# Patient Record
Sex: Female | Born: 1935 | Race: Black or African American | Hispanic: No | Smoking: Former smoker
Health system: Southern US, Community
[De-identification: ages and names within clinical notes are randomized; demographics above are authoritative.]

## PROBLEM LIST (undated history)

## (undated) DIAGNOSIS — K219 Gastro-esophageal reflux disease without esophagitis: Secondary | ICD-10-CM

## (undated) DIAGNOSIS — K4030 Unilateral inguinal hernia, with obstruction, without gangrene, not specified as recurrent: Secondary | ICD-10-CM

## (undated) DIAGNOSIS — F419 Anxiety disorder, unspecified: Secondary | ICD-10-CM

## (undated) DIAGNOSIS — G8929 Other chronic pain: Secondary | ICD-10-CM

## (undated) DIAGNOSIS — I10 Essential (primary) hypertension: Secondary | ICD-10-CM

## (undated) DIAGNOSIS — N189 Chronic kidney disease, unspecified: Secondary | ICD-10-CM

## (undated) DIAGNOSIS — I509 Heart failure, unspecified: Secondary | ICD-10-CM

## (undated) DIAGNOSIS — F32A Depression, unspecified: Secondary | ICD-10-CM

## (undated) DIAGNOSIS — E785 Hyperlipidemia, unspecified: Secondary | ICD-10-CM

## (undated) DIAGNOSIS — M545 Low back pain, unspecified: Secondary | ICD-10-CM

## (undated) DIAGNOSIS — D649 Anemia, unspecified: Secondary | ICD-10-CM

## (undated) DIAGNOSIS — I219 Acute myocardial infarction, unspecified: Secondary | ICD-10-CM

## (undated) DIAGNOSIS — E119 Type 2 diabetes mellitus without complications: Secondary | ICD-10-CM

## (undated) DIAGNOSIS — E669 Obesity, unspecified: Secondary | ICD-10-CM

## (undated) HISTORY — PX: ABDOMINAL HYSTERECTOMY: SHX81

## (undated) HISTORY — DX: Heart failure, unspecified: I50.9

## (undated) HISTORY — DX: Acute myocardial infarction, unspecified: I21.9

## (undated) HISTORY — DX: Essential (primary) hypertension: I10

## (undated) HISTORY — PX: VAGINAL HYSTERECTOMY: SHX2639

## (undated) HISTORY — DX: Hyperlipidemia, unspecified: E78.5

## (undated) HISTORY — PX: APPENDECTOMY: SHX54

## (undated) HISTORY — DX: Low back pain, unspecified: M54.50

## (undated) HISTORY — DX: Low back pain: M54.5

## (undated) HISTORY — PX: INCISION AND DRAINAGE PERIRECTAL ABSCESS: SHX1804

## (undated) HISTORY — DX: Obesity, unspecified: E66.9

## (undated) HISTORY — PX: HERNIA REPAIR: SHX51

## (undated) HISTORY — DX: Other chronic pain: G89.29

## (undated) HISTORY — PX: KNEE SURGERY: SHX244

## (undated) HISTORY — PX: THYROID SURGERY: SHX805

## (undated) HISTORY — PX: EYE SURGERY: SHX253

## (undated) HISTORY — DX: Type 2 diabetes mellitus without complications: E11.9

## (undated) HISTORY — DX: Chronic kidney disease, unspecified: N18.9

---

## 2004-02-10 ENCOUNTER — Ambulatory Visit: Payer: Self-pay | Admitting: Family Medicine

## 2004-02-24 ENCOUNTER — Ambulatory Visit: Payer: Self-pay | Admitting: Pain Medicine

## 2004-04-22 ENCOUNTER — Ambulatory Visit: Payer: Self-pay | Admitting: Pain Medicine

## 2004-07-08 ENCOUNTER — Ambulatory Visit: Payer: Self-pay | Admitting: Pain Medicine

## 2004-09-28 ENCOUNTER — Ambulatory Visit: Payer: Self-pay | Admitting: Pain Medicine

## 2004-11-25 ENCOUNTER — Ambulatory Visit: Payer: Self-pay | Admitting: Pain Medicine

## 2005-01-27 ENCOUNTER — Ambulatory Visit: Payer: Self-pay | Admitting: Pain Medicine

## 2005-04-14 ENCOUNTER — Ambulatory Visit: Payer: Self-pay | Admitting: Pain Medicine

## 2005-06-06 ENCOUNTER — Ambulatory Visit: Payer: Self-pay | Admitting: Pain Medicine

## 2005-06-30 ENCOUNTER — Ambulatory Visit: Payer: Self-pay | Admitting: Pain Medicine

## 2005-07-04 ENCOUNTER — Ambulatory Visit: Payer: Self-pay | Admitting: Pain Medicine

## 2005-08-02 ENCOUNTER — Ambulatory Visit: Payer: Self-pay | Admitting: Pain Medicine

## 2005-08-30 ENCOUNTER — Ambulatory Visit: Payer: Self-pay | Admitting: Pain Medicine

## 2005-11-08 ENCOUNTER — Ambulatory Visit: Payer: Self-pay | Admitting: Pain Medicine

## 2006-02-21 ENCOUNTER — Ambulatory Visit: Payer: Self-pay | Admitting: Pain Medicine

## 2006-03-06 ENCOUNTER — Ambulatory Visit: Payer: Self-pay | Admitting: Pain Medicine

## 2006-04-18 ENCOUNTER — Ambulatory Visit: Payer: Self-pay | Admitting: Pain Medicine

## 2006-06-01 ENCOUNTER — Ambulatory Visit: Payer: Self-pay | Admitting: Pain Medicine

## 2006-08-24 ENCOUNTER — Ambulatory Visit: Payer: Self-pay | Admitting: Pain Medicine

## 2006-11-16 ENCOUNTER — Ambulatory Visit: Payer: Self-pay | Admitting: Pain Medicine

## 2007-03-15 ENCOUNTER — Ambulatory Visit: Payer: Self-pay | Admitting: Pain Medicine

## 2007-06-12 ENCOUNTER — Ambulatory Visit: Payer: Self-pay | Admitting: Pain Medicine

## 2007-10-09 ENCOUNTER — Ambulatory Visit: Payer: Self-pay | Admitting: Pain Medicine

## 2008-01-08 ENCOUNTER — Ambulatory Visit: Payer: Self-pay | Admitting: Pain Medicine

## 2008-04-03 ENCOUNTER — Ambulatory Visit: Payer: Self-pay | Admitting: Pain Medicine

## 2008-07-03 ENCOUNTER — Ambulatory Visit: Payer: Self-pay | Admitting: Pain Medicine

## 2008-10-02 ENCOUNTER — Ambulatory Visit: Payer: Self-pay | Admitting: Pain Medicine

## 2008-11-07 ENCOUNTER — Ambulatory Visit: Payer: Self-pay | Admitting: Specialist

## 2009-01-15 ENCOUNTER — Ambulatory Visit: Payer: Self-pay | Admitting: Pain Medicine

## 2009-02-19 ENCOUNTER — Ambulatory Visit (HOSPITAL_COMMUNITY): Admission: RE | Admit: 2009-02-19 | Discharge: 2009-02-20 | Payer: Self-pay | Admitting: Ophthalmology

## 2009-08-06 ENCOUNTER — Ambulatory Visit (HOSPITAL_COMMUNITY)
Admission: RE | Admit: 2009-08-06 | Discharge: 2009-08-07 | Payer: Self-pay | Source: Home / Self Care | Admitting: Psychiatry

## 2009-10-27 ENCOUNTER — Ambulatory Visit: Payer: Self-pay | Admitting: Family Medicine

## 2009-11-25 ENCOUNTER — Ambulatory Visit: Payer: Self-pay | Admitting: Family Medicine

## 2009-12-01 ENCOUNTER — Ambulatory Visit: Payer: Self-pay | Admitting: Family Medicine

## 2010-04-20 ENCOUNTER — Ambulatory Visit: Payer: Self-pay | Admitting: Family Medicine

## 2010-05-02 ENCOUNTER — Ambulatory Visit: Payer: Self-pay | Admitting: Family Medicine

## 2010-06-02 ENCOUNTER — Ambulatory Visit: Payer: Self-pay | Admitting: Family Medicine

## 2010-07-01 ENCOUNTER — Ambulatory Visit: Payer: Self-pay | Admitting: Family Medicine

## 2010-07-21 LAB — CBC
Hemoglobin: 14.1 g/dL (ref 12.0–15.0)
MCHC: 32.2 g/dL (ref 30.0–36.0)
MCV: 86.7 fL (ref 78.0–100.0)

## 2010-07-21 LAB — BASIC METABOLIC PANEL
CO2: 32 mEq/L (ref 19–32)
Calcium: 9.7 mg/dL (ref 8.4–10.5)
GFR calc Af Amer: >60 mL/min (ref >60)
GFR calc non Af Amer: 57 mL/min — ABNORMAL LOW (ref >60)
Glucose, Bld: 131 mg/dL — ABNORMAL HIGH (ref 70–99)

## 2010-07-21 LAB — GLUCOSE, CAPILLARY
Glucose-Capillary: 143 mg/dL — ABNORMAL HIGH (ref 70–99)
Glucose-Capillary: 167 mg/dL — ABNORMAL HIGH (ref 70–99)
Glucose-Capillary: 173 mg/dL — ABNORMAL HIGH (ref 70–99)
Glucose-Capillary: 83 mg/dL (ref 70–99)

## 2010-08-05 LAB — CBC
Hemoglobin: 13.8 g/dL (ref 12.0–15.0)
MCHC: 32.9 g/dL (ref 30.0–36.0)
MCV: 87.3 fL (ref 78.0–100.0)
RBC: 4.79 MIL/uL (ref 3.87–5.11)
RDW: 14 % (ref 11.5–15.5)

## 2010-08-05 LAB — GLUCOSE, CAPILLARY
Glucose-Capillary: 107 mg/dL — ABNORMAL HIGH (ref 70–99)
Glucose-Capillary: 229 mg/dL — ABNORMAL HIGH (ref 70–99)
Glucose-Capillary: 238 mg/dL — ABNORMAL HIGH (ref 70–99)

## 2010-08-05 LAB — BASIC METABOLIC PANEL
BUN: 20 mg/dL (ref 6–23)
CO2: 28 mEq/L (ref 19–32)
Calcium: 10.2 mg/dL (ref 8.4–10.5)
Creatinine, Ser: 0.95 mg/dL (ref 0.4–1.2)
Glucose, Bld: 56 mg/dL — ABNORMAL LOW (ref 70–99)
Potassium: 3.8 mEq/L (ref 3.5–5.1)
Sodium: 141 mEq/L (ref 135–145)

## 2010-09-09 ENCOUNTER — Ambulatory Visit: Payer: Self-pay

## 2011-04-08 ENCOUNTER — Ambulatory Visit (INDEPENDENT_AMBULATORY_CARE_PROVIDER_SITE_OTHER): Payer: Self-pay | Admitting: Ophthalmology

## 2011-04-14 ENCOUNTER — Ambulatory Visit (INDEPENDENT_AMBULATORY_CARE_PROVIDER_SITE_OTHER): Payer: PRIVATE HEALTH INSURANCE | Admitting: Ophthalmology

## 2011-04-14 DIAGNOSIS — E11359 Type 2 diabetes mellitus with proliferative diabetic retinopathy without macular edema: Secondary | ICD-10-CM

## 2011-04-14 DIAGNOSIS — E1165 Type 2 diabetes mellitus with hyperglycemia: Secondary | ICD-10-CM

## 2011-04-14 DIAGNOSIS — H43819 Vitreous degeneration, unspecified eye: Secondary | ICD-10-CM

## 2011-08-15 ENCOUNTER — Ambulatory Visit (INDEPENDENT_AMBULATORY_CARE_PROVIDER_SITE_OTHER): Payer: PRIVATE HEALTH INSURANCE | Admitting: Ophthalmology

## 2011-08-15 DIAGNOSIS — E1139 Type 2 diabetes mellitus with other diabetic ophthalmic complication: Secondary | ICD-10-CM

## 2011-08-15 DIAGNOSIS — E11359 Type 2 diabetes mellitus with proliferative diabetic retinopathy without macular edema: Secondary | ICD-10-CM

## 2011-11-30 ENCOUNTER — Ambulatory Visit: Payer: Self-pay | Admitting: Family Medicine

## 2012-02-14 ENCOUNTER — Ambulatory Visit (INDEPENDENT_AMBULATORY_CARE_PROVIDER_SITE_OTHER): Payer: PRIVATE HEALTH INSURANCE | Admitting: Ophthalmology

## 2012-03-06 ENCOUNTER — Ambulatory Visit (INDEPENDENT_AMBULATORY_CARE_PROVIDER_SITE_OTHER): Payer: PRIVATE HEALTH INSURANCE | Admitting: Ophthalmology

## 2012-03-21 ENCOUNTER — Ambulatory Visit (INDEPENDENT_AMBULATORY_CARE_PROVIDER_SITE_OTHER): Payer: PRIVATE HEALTH INSURANCE | Admitting: Ophthalmology

## 2012-03-21 DIAGNOSIS — I10 Essential (primary) hypertension: Secondary | ICD-10-CM

## 2012-03-21 DIAGNOSIS — E1165 Type 2 diabetes mellitus with hyperglycemia: Secondary | ICD-10-CM

## 2012-03-21 DIAGNOSIS — E11359 Type 2 diabetes mellitus with proliferative diabetic retinopathy without macular edema: Secondary | ICD-10-CM

## 2012-03-21 DIAGNOSIS — H35039 Hypertensive retinopathy, unspecified eye: Secondary | ICD-10-CM

## 2012-03-21 DIAGNOSIS — H3581 Retinal edema: Secondary | ICD-10-CM

## 2012-03-21 DIAGNOSIS — E1139 Type 2 diabetes mellitus with other diabetic ophthalmic complication: Secondary | ICD-10-CM

## 2012-03-21 DIAGNOSIS — H34219 Partial retinal artery occlusion, unspecified eye: Secondary | ICD-10-CM

## 2012-04-06 ENCOUNTER — Other Ambulatory Visit (INDEPENDENT_AMBULATORY_CARE_PROVIDER_SITE_OTHER): Payer: PRIVATE HEALTH INSURANCE | Admitting: Ophthalmology

## 2012-04-06 DIAGNOSIS — H3581 Retinal edema: Secondary | ICD-10-CM

## 2012-04-06 DIAGNOSIS — E1139 Type 2 diabetes mellitus with other diabetic ophthalmic complication: Secondary | ICD-10-CM

## 2012-08-06 ENCOUNTER — Ambulatory Visit (INDEPENDENT_AMBULATORY_CARE_PROVIDER_SITE_OTHER): Payer: PRIVATE HEALTH INSURANCE | Admitting: Ophthalmology

## 2012-08-10 ENCOUNTER — Ambulatory Visit (INDEPENDENT_AMBULATORY_CARE_PROVIDER_SITE_OTHER): Payer: PRIVATE HEALTH INSURANCE | Admitting: Ophthalmology

## 2012-08-10 DIAGNOSIS — E1165 Type 2 diabetes mellitus with hyperglycemia: Secondary | ICD-10-CM

## 2012-08-10 DIAGNOSIS — H35039 Hypertensive retinopathy, unspecified eye: Secondary | ICD-10-CM

## 2012-08-10 DIAGNOSIS — E1139 Type 2 diabetes mellitus with other diabetic ophthalmic complication: Secondary | ICD-10-CM

## 2012-08-10 DIAGNOSIS — E11319 Type 2 diabetes mellitus with unspecified diabetic retinopathy without macular edema: Secondary | ICD-10-CM

## 2012-08-10 DIAGNOSIS — I10 Essential (primary) hypertension: Secondary | ICD-10-CM

## 2013-02-11 ENCOUNTER — Ambulatory Visit (INDEPENDENT_AMBULATORY_CARE_PROVIDER_SITE_OTHER): Payer: PRIVATE HEALTH INSURANCE | Admitting: Ophthalmology

## 2013-02-11 DIAGNOSIS — I10 Essential (primary) hypertension: Secondary | ICD-10-CM

## 2013-02-11 DIAGNOSIS — H35039 Hypertensive retinopathy, unspecified eye: Secondary | ICD-10-CM

## 2013-02-11 DIAGNOSIS — E11359 Type 2 diabetes mellitus with proliferative diabetic retinopathy without macular edema: Secondary | ICD-10-CM

## 2013-02-11 DIAGNOSIS — H43819 Vitreous degeneration, unspecified eye: Secondary | ICD-10-CM

## 2013-02-11 DIAGNOSIS — E1139 Type 2 diabetes mellitus with other diabetic ophthalmic complication: Secondary | ICD-10-CM

## 2013-05-10 ENCOUNTER — Ambulatory Visit: Payer: Self-pay | Admitting: Family Medicine

## 2013-05-11 LAB — COMPREHENSIVE METABOLIC PANEL
ALT: 33 U/L (ref 12–78)
Albumin: 3.6 g/dL (ref 3.4–5.0)
Alkaline Phosphatase: 107 U/L
Anion Gap: 2 — ABNORMAL LOW (ref 7–16)
BILIRUBIN TOTAL: 0.2 mg/dL (ref 0.2–1.0)
BUN: 25 mg/dL — ABNORMAL HIGH (ref 7–18)
CHLORIDE: 104 mmol/L (ref 98–107)
CO2: 33 mmol/L — AB (ref 21–32)
Calcium, Total: 8.8 mg/dL (ref 8.5–10.1)
Creatinine: 1.46 mg/dL — ABNORMAL HIGH (ref 0.60–1.30)
EGFR (African American): 39 — ABNORMAL LOW
GFR CALC NON AF AMER: 34 — AB
Glucose: 275 mg/dL — ABNORMAL HIGH (ref 65–99)
OSMOLALITY: 292 (ref 275–301)
POTASSIUM: 4.1 mmol/L (ref 3.5–5.1)
SGOT(AST): 46 U/L — ABNORMAL HIGH (ref 15–37)
Sodium: 139 mmol/L (ref 136–145)
Total Protein: 7.7 g/dL (ref 6.4–8.2)

## 2013-05-11 LAB — URINALYSIS, COMPLETE
BILIRUBIN, UR: NEGATIVE
Ketone: NEGATIVE
Leukocyte Esterase: NEGATIVE
Nitrite: NEGATIVE
Ph: 7 (ref 4.5–8.0)
RBC,UR: 8 /HPF (ref 0–5)
SPECIFIC GRAVITY: 1.01 (ref 1.003–1.030)
WBC UR: 5 /HPF (ref 0–5)

## 2013-05-11 LAB — CBC
HCT: 44.9 % (ref 35.0–47.0)
HGB: 14.5 g/dL (ref 12.0–16.0)
MCH: 27.3 pg (ref 26.0–34.0)
MCHC: 32.3 g/dL (ref 32.0–36.0)
MCV: 85 fL (ref 80–100)
Platelet: 212 10*3/uL (ref 150–440)
RBC: 5.31 10*6/uL — ABNORMAL HIGH (ref 3.80–5.20)
RDW: 14.2 % (ref 11.5–14.5)
WBC: 5.9 10*3/uL (ref 3.6–11.0)

## 2013-05-11 LAB — PRO B NATRIURETIC PEPTIDE: B-Type Natriuretic Peptide: 1091 pg/mL — ABNORMAL HIGH (ref 0–450)

## 2013-05-11 LAB — CK TOTAL AND CKMB (NOT AT ARMC)
CK, Total: 1555 U/L — ABNORMAL HIGH (ref 21–215)
CK-MB: 9.6 ng/mL — ABNORMAL HIGH (ref 0.5–3.6)

## 2013-05-11 LAB — TROPONIN I: Troponin-I: 0.2 ng/mL — ABNORMAL HIGH

## 2013-05-12 ENCOUNTER — Inpatient Hospital Stay: Payer: Self-pay | Admitting: Internal Medicine

## 2013-05-12 DIAGNOSIS — I319 Disease of pericardium, unspecified: Secondary | ICD-10-CM

## 2013-05-12 DIAGNOSIS — I10 Essential (primary) hypertension: Secondary | ICD-10-CM

## 2013-05-12 DIAGNOSIS — I214 Non-ST elevation (NSTEMI) myocardial infarction: Secondary | ICD-10-CM

## 2013-05-12 DIAGNOSIS — I509 Heart failure, unspecified: Secondary | ICD-10-CM

## 2013-05-12 LAB — CK-MB
CK-MB: 8.2 ng/mL — AB (ref 0.5–3.6)
CK-MB: 6.4 ng/mL — ABNORMAL HIGH (ref 0.5–3.6)

## 2013-05-12 LAB — TROPONIN I
TROPONIN-I: 0.19 ng/mL — AB
Troponin-I: 0.2 ng/mL — ABNORMAL HIGH

## 2013-05-13 DIAGNOSIS — R7989 Other specified abnormal findings of blood chemistry: Secondary | ICD-10-CM

## 2013-05-13 DIAGNOSIS — I5033 Acute on chronic diastolic (congestive) heart failure: Secondary | ICD-10-CM

## 2013-05-13 DIAGNOSIS — I10 Essential (primary) hypertension: Secondary | ICD-10-CM

## 2013-05-13 LAB — CBC WITH DIFFERENTIAL/PLATELET
BASOS ABS: 0 10*3/uL (ref 0.0–0.1)
Basophil %: 0.2 %
EOS ABS: 0 10*3/uL (ref 0.0–0.7)
EOS PCT: 0 %
HCT: 39.4 % (ref 35.0–47.0)
HGB: 12.8 g/dL (ref 12.0–16.0)
LYMPHS ABS: 0.5 10*3/uL — AB (ref 1.0–3.6)
Lymphocyte %: 8.8 %
MCH: 27.3 pg (ref 26.0–34.0)
MCHC: 32.4 g/dL (ref 32.0–36.0)
MCV: 84 fL (ref 80–100)
MONO ABS: 0.1 x10 3/mm — AB (ref 0.2–0.9)
MONOS PCT: 2 %
Neutrophil #: 5.4 10*3/uL (ref 1.4–6.5)
Neutrophil %: 89 %
PLATELETS: 215 10*3/uL (ref 150–440)
RBC: 4.67 10*6/uL (ref 3.80–5.20)
RDW: 14.1 % (ref 11.5–14.5)
WBC: 6.1 10*3/uL (ref 3.6–11.0)

## 2013-05-13 LAB — BASIC METABOLIC PANEL
ANION GAP: 6 — AB (ref 7–16)
BUN: 48 mg/dL — ABNORMAL HIGH (ref 7–18)
CO2: 29 mmol/L (ref 21–32)
Calcium, Total: 8.3 mg/dL — ABNORMAL LOW (ref 8.5–10.1)
Chloride: 99 mmol/L (ref 98–107)
Creatinine: 2.04 mg/dL — ABNORMAL HIGH (ref 0.60–1.30)
EGFR (African American): 26 — ABNORMAL LOW
EGFR (Non-African Amer.): 23 — ABNORMAL LOW
Glucose: 281 mg/dL — ABNORMAL HIGH (ref 65–99)
OSMOLALITY: 291 (ref 275–301)
POTASSIUM: 4.2 mmol/L (ref 3.5–5.1)
Sodium: 134 mmol/L — ABNORMAL LOW (ref 136–145)

## 2013-05-13 LAB — LIPID PANEL
Cholesterol: 137 mg/dL (ref 0–200)
HDL Cholesterol: 43 mg/dL (ref 40–60)
Ldl Cholesterol, Calc: 73 mg/dL (ref 0–100)
Triglycerides: 104 mg/dL (ref 0–200)
VLDL CHOLESTEROL, CALC: 21 mg/dL (ref 5–40)

## 2013-05-13 LAB — TSH: THYROID STIMULATING HORM: 0.931 u[IU]/mL

## 2013-05-13 LAB — HEMOGLOBIN A1C: Hemoglobin A1C: 8.5 % — ABNORMAL HIGH (ref 4.2–6.3)

## 2013-05-14 LAB — BASIC METABOLIC PANEL
Anion Gap: 7 (ref 7–16)
BUN: 70 mg/dL — ABNORMAL HIGH (ref 7–18)
Calcium, Total: 8.1 mg/dL — ABNORMAL LOW (ref 8.5–10.1)
Chloride: 98 mmol/L (ref 98–107)
Co2: 28 mmol/L (ref 21–32)
Creatinine: 2.47 mg/dL — ABNORMAL HIGH (ref 0.60–1.30)
EGFR (African American): 21 — ABNORMAL LOW
GFR CALC NON AF AMER: 18 — AB
Glucose: 160 mg/dL — ABNORMAL HIGH (ref 65–99)
Osmolality: 290 (ref 275–301)
Potassium: 4.1 mmol/L (ref 3.5–5.1)
Sodium: 133 mmol/L — ABNORMAL LOW (ref 136–145)

## 2013-05-15 ENCOUNTER — Telehealth: Payer: Self-pay

## 2013-05-15 LAB — BASIC METABOLIC PANEL
Anion Gap: 5 — ABNORMAL LOW (ref 7–16)
BUN: 80 mg/dL — AB (ref 7–18)
CHLORIDE: 99 mmol/L (ref 98–107)
Calcium, Total: 8 mg/dL — ABNORMAL LOW (ref 8.5–10.1)
Co2: 28 mmol/L (ref 21–32)
Creatinine: 2.27 mg/dL — ABNORMAL HIGH (ref 0.60–1.30)
EGFR (African American): 23 — ABNORMAL LOW
EGFR (Non-African Amer.): 20 — ABNORMAL LOW
GLUCOSE: 95 mg/dL (ref 65–99)
Osmolality: 288 (ref 275–301)
POTASSIUM: 4 mmol/L (ref 3.5–5.1)
SODIUM: 132 mmol/L — AB (ref 136–145)

## 2013-05-15 NOTE — Telephone Encounter (Signed)
Attempted to contact pt regarding discharge from [facility] on 05/15/13. Left message for pt to call back.

## 2013-05-16 NOTE — Telephone Encounter (Signed)
Patient contacted regarding discharge from [facility] on 05/15/13.  Patient understands to follow up with Dr. Fletcher Anon on 05/23/13 at 2:45 at [facility]. Patient understands discharge instructions? yes Patient understands medications and regiment? yes Patient understands to bring all medications to this visit? yes

## 2013-05-22 ENCOUNTER — Encounter: Payer: Self-pay | Admitting: *Deleted

## 2013-05-23 ENCOUNTER — Encounter: Payer: Self-pay | Admitting: Cardiovascular Disease

## 2013-05-23 ENCOUNTER — Ambulatory Visit (INDEPENDENT_AMBULATORY_CARE_PROVIDER_SITE_OTHER): Payer: PRIVATE HEALTH INSURANCE | Admitting: Cardiovascular Disease

## 2013-05-23 VITALS — BP 120/66 | HR 90 | Ht 63.0 in | Wt 157.0 lb

## 2013-05-23 DIAGNOSIS — R0602 Shortness of breath: Secondary | ICD-10-CM

## 2013-05-23 DIAGNOSIS — I10 Essential (primary) hypertension: Secondary | ICD-10-CM

## 2013-05-23 DIAGNOSIS — I5032 Chronic diastolic (congestive) heart failure: Secondary | ICD-10-CM

## 2013-05-23 NOTE — Patient Instructions (Addendum)
Your physician has requested that you have a lexiscan myoview. For further information please visit [email or web address]. Please follow instruction sheet, as given.  Your physician has requested that you have a renal artery duplex. During this test, an ultrasound is used to evaluate blood flow to the kidneys. Allow one hour for this exam. Do not eat after midnight the day before and avoid carbonated beverages. Take your medications as you usually do.  Follow up after tests.   [place]  Your caregiver has ordered a Stress Test with nuclear imaging. The purpose of this test is to evaluate the blood supply to your heart muscle. This procedure is referred to as a "Non-Invasive Stress Test." This is because other than having an IV started in your vein, nothing is inserted or "invades" your body. Cardiac stress tests are done to find areas of poor blood flow to the heart by determining the extent of coronary artery disease (CAD). Some patients exercise on a treadmill, which naturally increases the blood flow to your heart, while others who are  unable to walk on a treadmill due to physical limitations have a pharmacologic/chemical stress agent called Lexiscan . This medicine will mimic walking on a treadmill by temporarily increasing your coronary blood flow.   Please note: these test may take anywhere between 2-4 hours to complete  PLEASE REPORT TO [place] AT THE FIRST DESK WILL DIRECT YOU WHERE TO GO  Date of Procedure:_____________1/29/15________________________  Arrival Time for Procedure:____________07:45am__________________  Instructions regarding medication:   _X___ : Hold diabetes medication morning of procedure  PLEASE NOTIFY THE OFFICE AT LEAST 24 HOURS IN ADVANCE IF YOU ARE UNABLE TO KEEP YOUR APPOINTMENT.  [phone] AND  PLEASE NOTIFY NUCLEAR MEDICINE AT [facility] AT LEAST 24 HOURS IN ADVANCE IF YOU ARE UNABLE TO KEEP YOUR APPOINTMENT.  [phone]  How to prepare for your Myoview test:  1. Do not eat or drink after midnight 2. No caffeine for 24 hours prior to test 3. No smoking 24 hours prior to test. 4. Your medication may be taken with water.  If your doctor stopped a medication because of this test, do not take that medication. 5. Ladies, please do not wear dresses.  Skirts or pants are appropriate. Please wear a short sleeve shirt. 6. No perfume, cologne or lotion. 7. Wear comfortable walking shoes. No heels!

## 2013-05-24 ENCOUNTER — Encounter: Payer: Self-pay | Admitting: Cardiovascular Disease

## 2013-05-24 DIAGNOSIS — I5033 Acute on chronic diastolic (congestive) heart failure: Secondary | ICD-10-CM | POA: Insufficient documentation

## 2013-05-24 DIAGNOSIS — R0602 Shortness of breath: Secondary | ICD-10-CM | POA: Insufficient documentation

## 2013-05-24 DIAGNOSIS — I5032 Chronic diastolic (congestive) heart failure: Secondary | ICD-10-CM | POA: Insufficient documentation

## 2013-05-24 DIAGNOSIS — I10 Essential (primary) hypertension: Secondary | ICD-10-CM | POA: Insufficient documentation

## 2013-05-24 HISTORY — DX: Shortness of breath: R06.02

## 2013-05-24 HISTORY — DX: Essential (primary) hypertension: I10

## 2013-05-24 NOTE — Progress Notes (Signed)
Primary care physician: Dr. [name]  HPI  This is a pleasant 80 year old African American female who is here today for a followup visit after recent hospitalization at [facility]. She has known history of hypertension and diabetes. She presented on January 11 with acute onset of shortness of breath. She was noted to be hypoxic and hypotensive with initial systolic blood pressure of 220 mm mercury. She was in moderate respiratory distress. She was treated with IV Lasix with marked improvement EKG showed nonspecific changes. Troponin was mildly elevated. She had an echocardiogram done which showed an ejection fraction of 55-60% with moderate left ventricular hypertrophy and grade 1 diastolic dysfunction. She was started on carvedilol. She was discharged home on home oxygen. She has been feeling better since hospital discharge. Blood pressure has been more well controlled. She denies chest pain at the present time. She continues to have exertional dyspnea. No orthopnea or PND.  Allergies  Allergen Reactions  . Codeine      Current Outpatient Prescriptions on File Prior to Visit  Medication Sig Dispense Refill  . amLODipine (NORVASC) 10 MG tablet Take 10 mg by mouth daily.      [name] aspirin 81 MG tablet Take 81 mg by mouth daily.      [name] azithromycin (ZITHROMAX) 250 MG tablet Take 250 mg by mouth daily.      . carvedilol (COREG) 6.25 MG tablet Take 6.25 mg by mouth 2 (two) times daily with a meal.      . clonazePAM (KLONOPIN) 0.5 MG tablet Take 0.25 mg by mouth 2 (two) times daily as needed for anxiety.      [name] glimepiride (AMARYL) 4 MG tablet Take 4 mg by mouth daily with breakfast.      . insulin lispro protamine-lispro (HUMALOG 75/25 MIX) (75-25) 100 UNIT/ML SUSP injection Inject into the skin 2 (two) times daily with a meal.      . isosorbide mononitrate (IMDUR) 30 MG 24 hr tablet Take 30 mg by mouth daily.      [name] omeprazole (PRILOSEC) 20 MG capsule Take 20 mg by mouth daily.      . predniSONE  (DELTASONE) 10 MG tablet Take 10 mg by mouth taper from 4 doses each day to 1 dose and stop.      . simvastatin (ZOCOR) 40 MG tablet Take 40 mg by mouth daily.      . traZODone (DESYREL) 100 MG tablet Take 100 mg by mouth at bedtime.       No current facility-administered medications on file prior to visit.     Past Medical History  Diagnosis Date  . Diabetes mellitus without complication   . Hypertension   . Obesity   . Acute heart failure   . MI (myocardial infarction)   . Hyperlipidemia   . Chronic low back pain      Past Surgical History  Procedure Laterality Date  . Vaginal hysterectomy    . Knee surgery    . Eye surgery    . Thyroid surgery       Family History  Problem Relation Age of Onset  . Heart attack Mother      History   Social History  . Marital Status: Widowed    Spouse Name: N/A    Number of Children: N/A  . Years of Education: N/A   Occupational History  . Not on file.   Social History Main Topics  . Smoking status: Former Smoker    Types: Cigarettes  . Smokeless  tobacco: Not on file  . Alcohol Use: No  . Drug Use: No  . Sexual Activity: Not on file   Other Topics Concern  . Not on file   Social History Narrative  . No narrative on file     PHYSICAL EXAM   BP 120/66  Pulse 90  Ht 5\' 3"  (1.6 m)  Wt 157 lb (71.215 kg)  BMI 27.82 kg/m2  SpO2 89% Constitutional: She is oriented to person, place, and time. She appears well-developed and well-nourished. No distress.  HENT: No nasal discharge.  Head: Normocephalic and atraumatic.  Eyes: Pupils are equal and round. No discharge.  Neck: Normal range of motion. Neck supple. No JVD present. No thyromegaly present.  Cardiovascular: Normal rate, regular rhythm, normal heart sounds. Exam reveals no gallop and no friction rub. No murmur heard.  Pulmonary/Chest: Effort normal and breath sounds normal. No stridor. No respiratory distress. She has no wheezes. She has no rales. She exhibits  no tenderness.  Abdominal: Soft. Bowel sounds are normal. She exhibits no distension. There is no tenderness. There is no rebound and no guarding.  Musculoskeletal: Normal range of motion. She exhibits no edema and no tenderness.  Neurological: She is alert and oriented to person, place, and time. Coordination normal.  Skin: Skin is warm and dry. No rash noted. She is not diaphoretic. No erythema. No pallor.  Psychiatric: She has a normal mood and affect. Her behavior is normal. Judgment and thought content normal.     [id number]  Rhythm  -Short PR syndrome  PRi = 114 Low voltage in precordial leads.   -  Nonspecific T-abnormality.   ABNORMAL     ASSESSMENT AND PLAN

## 2013-05-24 NOTE — Assessment & Plan Note (Signed)
Due to her recent presentation with flash pulmonary edema and severe hypertension, I think it's important to exclude renal artery stenosis as the culprit. I recommend evaluation with renal artery duplex .

## 2013-05-24 NOTE — Assessment & Plan Note (Signed)
This is likely due to chronic diastolic heart failure. However, it is important to exclude ischemic heart disease especially with her multiple risk factors and prolonged history of diabetes. I recommend evaluation with a pharmacologic nuclear stress test. She is not able to exercise on a treadmill. She asked if she could remove supplemental oxygen. Oxygen saturation at rest was 97%. However, with walking in the hallway it dropped to 89%. Thus, I advised her to continue using the oxygen at least with activities and at night. This can be reevaluated upon followup.

## 2013-05-24 NOTE — Assessment & Plan Note (Signed)
She appears to be euvolemic even without a diuretic. Blood pressure is now reasonably controlled. If uncontrolled hypertension becomes an issue again, a thiazide diuretic can be considered.

## 2013-05-30 ENCOUNTER — Ambulatory Visit: Payer: Self-pay | Admitting: Cardiovascular Disease

## 2013-05-30 ENCOUNTER — Other Ambulatory Visit: Payer: Self-pay

## 2013-05-30 DIAGNOSIS — R079 Chest pain, unspecified: Secondary | ICD-10-CM

## 2013-05-30 DIAGNOSIS — R0602 Shortness of breath: Secondary | ICD-10-CM

## 2013-05-31 ENCOUNTER — Encounter (INDEPENDENT_AMBULATORY_CARE_PROVIDER_SITE_OTHER): Payer: PRIVATE HEALTH INSURANCE

## 2013-05-31 DIAGNOSIS — I10 Essential (primary) hypertension: Secondary | ICD-10-CM

## 2013-06-10 ENCOUNTER — Encounter: Payer: Self-pay | Admitting: Cardiovascular Disease

## 2013-06-10 ENCOUNTER — Ambulatory Visit (INDEPENDENT_AMBULATORY_CARE_PROVIDER_SITE_OTHER): Payer: PRIVATE HEALTH INSURANCE | Admitting: Cardiovascular Disease

## 2013-06-10 VITALS — BP 146/72 | HR 93 | Ht 63.0 in | Wt 168.2 lb

## 2013-06-10 DIAGNOSIS — I10 Essential (primary) hypertension: Secondary | ICD-10-CM

## 2013-06-10 DIAGNOSIS — I5032 Chronic diastolic (congestive) heart failure: Secondary | ICD-10-CM

## 2013-06-10 MED ORDER — CARVEDILOL 12.5 MG PO TABS: 12.5000 mg | ORAL_TABLET | Freq: Two times a day (BID) | ORAL | Status: DC

## 2013-06-10 MED ORDER — AMLODIPINE BESYLATE 5 MG PO TABS: 5.0000 mg | ORAL_TABLET | Freq: Every day | ORAL | Status: DC

## 2013-06-10 NOTE — Assessment & Plan Note (Signed)
She is having significant lower extremity edema and has gained about 10 pounds over the last month. We discussed with her the importance of low sodium diet. The lower extremity edema is probably aggravated by treatment with high-dose amlodipine as well. She is already on hydrochlorothiazide. I decreased amlodipine to 5 mg once daily and increase carvedilol to 12.5 mg twice daily. If symptoms do not improve, she might require a small dose loop diuretic. She is not hypoxic at this but still with significant exertional hypoxia. Oxygen saturation dropped to 86% with walking in the hallway. She is on home oxygen. Stress test showed no evidence of ischemia. There was no evidence of renal artery stenosis by Doppler.

## 2013-06-10 NOTE — Assessment & Plan Note (Signed)
Blood pressure is more controlled. I made some changes as outlined above.

## 2013-06-10 NOTE — Patient Instructions (Signed)
Make the following changes to your medications:  1. Decrease Amlodipine to 5 mg once daily.  2. Increase Carvedilol to 12.5 mg twice daily .  3. Stop Isosorbide Mononitrate (Imdur).   Follow up in 3 months.

## 2013-06-10 NOTE — Progress Notes (Signed)
Primary care physician: Dr. [name]  HPI  This is a pleasant 80 year old African American female who is here today for a followup visit regarding chronic diastolic heart failure . She has known history of hypertension and diabetes. She presented to [facility] on January 11 with acute onset of shortness of breath. She was noted to be hypoxic and hypotensive with initial systolic blood pressure of 220 mm mercury. She was in moderate respiratory distress. She was treated with IV Lasix with marked improvement EKG showed nonspecific changes. Troponin was mildly elevated. She had an echocardiogram done which showed an ejection fraction of 55-60% with moderate left ventricular hypertrophy and grade 1 diastolic dysfunction. She was started on carvedilol. She was discharged home on home oxygen. She has been feeling better since hospital discharge. Blood pressure has been more well controlled. She denies chest pain at the present time. She continues to have exertional dyspnea. No orthopnea or PND. I proceeded with a nuclear stress test which showed no evidence of ischemia with normal ejection fraction. Renal artery duplex ultrasound showed no evidence of renal artery stenosis. She has noticed worsening lower extremity edema especially at the end of the day.   Allergies  Allergen Reactions  . Codeine      Current Outpatient Prescriptions on File Prior to Visit  Medication Sig Dispense Refill  . amLODipine (NORVASC) 10 MG tablet Take 10 mg by mouth daily.      [name] aspirin 81 MG tablet Take 81 mg by mouth daily.      . carvedilol (COREG) 6.25 MG tablet Take 6.25 mg by mouth 2 (two) times daily with a meal.      . clonazePAM (KLONOPIN) 0.5 MG tablet Take 0.25 mg by mouth 2 (two) times daily as needed for anxiety.      [name] glimepiride (AMARYL) 4 MG tablet Take 4 mg by mouth daily with breakfast.      . insulin lispro protamine-lispro (HUMALOG 75/25 MIX) (75-25) 100 UNIT/ML SUSP injection Inject into the skin 2 (two)  times daily with a meal.      . isosorbide mononitrate (IMDUR) 30 MG 24 hr tablet Take 30 mg by mouth daily.      [name] omeprazole (PRILOSEC) 20 MG capsule Take 20 mg by mouth daily.      . simvastatin (ZOCOR) 40 MG tablet Take 40 mg by mouth daily.      . traZODone (DESYREL) 100 MG tablet Take 100 mg by mouth at bedtime.       No current facility-administered medications on file prior to visit.     Past Medical History  Diagnosis Date  . Diabetes mellitus without complication   . Hypertension   . Obesity   . Acute heart failure   . MI (myocardial infarction)   . Hyperlipidemia   . Chronic low back pain      Past Surgical History  Procedure Laterality Date  . Vaginal hysterectomy    . Knee surgery    . Eye surgery    . Thyroid surgery       Family History  Problem Relation Age of Onset  . Heart attack Mother      History   Social History  . Marital Status: Widowed    Spouse Name: N/A    Number of Children: N/A  . Years of Education: N/A   Occupational History  . Not on file.   Social History Main Topics  . Smoking status: Former Smoker    Types: Cigarettes  .  Smokeless tobacco: Not on file  . Alcohol Use: No  . Drug Use: No  . Sexual Activity: Not on file   Other Topics Concern  . Not on file   Social History Narrative  . No narrative on file     PHYSICAL EXAM   BP 146/72  Pulse 93  Ht 5\' 3"  (1.6 m)  Wt 168 lb 4 oz (76.318 kg)  BMI 29.81 kg/m2 Constitutional: She is oriented to person, place, and time. She appears well-developed and well-nourished. No distress.  HENT: No nasal discharge.  Head: Normocephalic and atraumatic.  Eyes: Pupils are equal and round. No discharge.  Neck: Normal range of motion. Neck supple. No JVD present. No thyromegaly present.  Cardiovascular: Normal rate, regular rhythm, normal heart sounds. Exam reveals no gallop and no friction rub. No murmur heard.  Pulmonary/Chest: Effort normal and breath sounds normal. No  stridor. No respiratory distress. She has no wheezes. She has no rales. She exhibits no tenderness.  Abdominal: Soft. Bowel sounds are normal. She exhibits no distension. There is no tenderness. There is no rebound and no guarding.  Musculoskeletal: Normal range of motion. She exhibits +2 edema and no tenderness.  Neurological: She is alert and oriented to person, place, and time. Coordination normal.  Skin: Skin is warm and dry. No rash noted. She is not diaphoretic. No erythema. No pallor.  Psychiatric: She has a normal mood and affect. Her behavior is normal. Judgment and thought content normal.        ASSESSMENT AND PLAN

## 2013-07-18 ENCOUNTER — Ambulatory Visit: Payer: Self-pay | Admitting: Family Medicine

## 2013-08-16 ENCOUNTER — Ambulatory Visit (INDEPENDENT_AMBULATORY_CARE_PROVIDER_SITE_OTHER): Payer: PRIVATE HEALTH INSURANCE | Admitting: Cardiovascular Disease

## 2013-08-16 ENCOUNTER — Encounter: Payer: Self-pay | Admitting: Cardiovascular Disease

## 2013-08-16 VITALS — BP 126/67 | HR 76 | Ht 62.0 in | Wt 155.8 lb

## 2013-08-16 DIAGNOSIS — I10 Essential (primary) hypertension: Secondary | ICD-10-CM

## 2013-08-16 DIAGNOSIS — I5032 Chronic diastolic (congestive) heart failure: Secondary | ICD-10-CM

## 2013-08-16 NOTE — Progress Notes (Signed)
Primary care physician: Dr. [name]  HPI  This is a pleasant 80 year old African American female who is here today for a followup visit regarding chronic diastolic heart failure . She has known history of hypertension and diabetes. She presented to [facility] on January 11 with acute onset of shortness of breath. She was noted to be hypoxic and hypertensive with initial systolic blood pressure of 220 mm mercury. She was in moderate respiratory distress. She was treated with IV Lasix with marked improvement. EKG showed nonspecific changes. Troponin was mildly elevated. She had an echocardiogram done which showed an ejection fraction of 55-60% with moderate left ventricular hypertrophy and grade 1 diastolic dysfunction.  Nuclear stress test in January  showed no evidence of ischemia with normal ejection fraction. Renal artery duplex ultrasound showed no evidence of renal artery stenosis. During last visit, she had significant lower extremity edema and weight gain. I decreased the dose of amlodipine. Lasix was subsequently added at 20 mg twice daily. She reports significant improvement in symptoms. She lost 13 pounds since last visit. Dyspnea improved significantly. She does have chronic kidney disease. Recent labs showed a creatinine of 1.66 with a BUN of 42. Hemoglobin was 10.5. BNP was 35.  Allergies  Allergen Reactions  . Codeine      Current Outpatient Prescriptions on File Prior to Visit  Medication Sig Dispense Refill  . amLODipine (NORVASC) 5 MG tablet Take 1 tablet (5 mg total) by mouth daily.  30 tablet  6  . aspirin 81 MG tablet Take 81 mg by mouth daily.      . carvedilol (COREG) 12.5 MG tablet Take 1 tablet (12.5 mg total) by mouth 2 (two) times daily.  60 tablet  6  . clonazePAM (KLONOPIN) 0.5 MG tablet Takes 1/2 tablet to 1 tablet as needed.      . insulin lispro protamine-lispro (HUMALOG 75/25 MIX) (75-25) 100 UNIT/ML SUSP injection Inject into the skin 2 (two) times daily with a  meal.      . omeprazole (PRILOSEC) 20 MG capsule Take 20 mg by mouth daily.      . quinapril (ACCUPRIL) 40 MG tablet Take 40 mg by mouth daily.       . simvastatin (ZOCOR) 40 MG tablet Take 40 mg by mouth daily.      . traZODone (DESYREL) 100 MG tablet Take 100 mg by mouth at bedtime.       No current facility-administered medications on file prior to visit.     Past Medical History  Diagnosis Date  . Diabetes mellitus without complication   . Hypertension   . Obesity   . Acute heart failure   . MI (myocardial infarction)   . Hyperlipidemia   . Chronic low back pain      Past Surgical History  Procedure Laterality Date  . Vaginal hysterectomy    . Knee surgery    . Eye surgery    . Thyroid surgery       Family History  Problem Relation Age of Onset  . Heart attack Mother      History   Social History  . Marital Status: Widowed    Spouse Name: N/A    Number of Children: N/A  . Years of Education: N/A   Occupational History  . Not on file.   Social History Main Topics  . Smoking status: Former Smoker    Types: Cigarettes  . Smokeless tobacco: Not on file  . Alcohol Use: No  .  Drug Use: No  . Sexual Activity: Not on file   Other Topics Concern  . Not on file   Social History Narrative  . No narrative on file     PHYSICAL EXAM   BP 126/67  Pulse 76  Ht 5\' 2"  (1.575 m)  Wt 155 lb 12 oz (70.648 kg)  BMI 28.48 kg/m2 Constitutional: She is oriented to person, place, and time. She appears well-developed and well-nourished. No distress.  HENT: No nasal discharge.  Head: Normocephalic and atraumatic.  Eyes: Pupils are equal and round. No discharge.  Neck: Normal range of motion. Neck supple. No JVD present. No thyromegaly present.  Cardiovascular: Normal rate, regular rhythm, normal heart sounds. Exam reveals no gallop and no friction rub. No murmur heard.  Pulmonary/Chest: Effort normal and breath sounds normal. No stridor. No respiratory distress.  She has no wheezes. She has no rales. She exhibits no tenderness.  Abdominal: Soft. Bowel sounds are normal. She exhibits no distension. There is no tenderness. There is no rebound and no guarding.  Musculoskeletal: Normal range of motion. She exhibits +1 edema and no tenderness.  Neurological: She is alert and oriented to person, place, and time. Coordination normal.  Skin: Skin is warm and dry. No rash noted. She is not diaphoretic. No erythema. No pallor.  Psychiatric: She has a normal mood and affect. Her behavior is normal. Judgment and thought content normal.        ASSESSMENT AND PLAN

## 2013-08-16 NOTE — Assessment & Plan Note (Addendum)
She is doing much better with significant improvement in fluid overload. However, she might be mildly volume depleted especially with slight worsening in kidney function. Due to that, I discontinued hydrochlorothiazide. She has been on furosemide 20 mg twice daily. If weight continues to go down, consider decreasing the dose to 20 mg once daily.

## 2013-08-16 NOTE — Patient Instructions (Signed)
Stop taking Hydrochlorothiazide.  Continue other medications.   Follow up in 3 months.

## 2013-08-16 NOTE — Assessment & Plan Note (Signed)
Blood pressure is now well controlled on current medications. 

## 2013-08-21 ENCOUNTER — Ambulatory Visit (INDEPENDENT_AMBULATORY_CARE_PROVIDER_SITE_OTHER): Payer: PRIVATE HEALTH INSURANCE | Admitting: Ophthalmology

## 2013-08-21 DIAGNOSIS — H35039 Hypertensive retinopathy, unspecified eye: Secondary | ICD-10-CM

## 2013-08-21 DIAGNOSIS — E1139 Type 2 diabetes mellitus with other diabetic ophthalmic complication: Secondary | ICD-10-CM

## 2013-08-21 DIAGNOSIS — I10 Essential (primary) hypertension: Secondary | ICD-10-CM

## 2013-08-21 DIAGNOSIS — E11359 Type 2 diabetes mellitus with proliferative diabetic retinopathy without macular edema: Secondary | ICD-10-CM

## 2013-08-21 DIAGNOSIS — H43819 Vitreous degeneration, unspecified eye: Secondary | ICD-10-CM

## 2013-08-21 DIAGNOSIS — E1165 Type 2 diabetes mellitus with hyperglycemia: Secondary | ICD-10-CM

## 2013-09-12 ENCOUNTER — Ambulatory Visit: Payer: PRIVATE HEALTH INSURANCE | Admitting: Cardiovascular Disease

## 2013-11-15 ENCOUNTER — Encounter: Payer: Self-pay | Admitting: Cardiovascular Disease

## 2013-11-15 ENCOUNTER — Ambulatory Visit: Payer: PRIVATE HEALTH INSURANCE | Admitting: Cardiovascular Disease

## 2013-11-15 ENCOUNTER — Ambulatory Visit (INDEPENDENT_AMBULATORY_CARE_PROVIDER_SITE_OTHER): Payer: PRIVATE HEALTH INSURANCE | Admitting: Cardiovascular Disease

## 2013-11-15 VITALS — BP 102/60 | HR 62 | Ht 63.0 in | Wt 156.2 lb

## 2013-11-15 DIAGNOSIS — I5032 Chronic diastolic (congestive) heart failure: Secondary | ICD-10-CM

## 2013-11-15 DIAGNOSIS — I10 Essential (primary) hypertension: Secondary | ICD-10-CM

## 2013-11-15 NOTE — Assessment & Plan Note (Signed)
Blood pressure is now well controlled. If anything, it has been running on the low side. Thus, I discontinued amlodipine as outlined above.

## 2013-11-15 NOTE — Progress Notes (Signed)
Primary care physician: Dr. [name]  HPI  This is a pleasant 80 year old African American female who is here today for a followup visit regarding chronic diastolic heart failure . She has known history of hypertension and diabetes. She presented to [facility] in January,2015  with acute onset of shortness of breath. She was noted to be hypoxic and hypertensive with initial systolic blood pressure of 220 mm mercury. She was in moderate respiratory distress. She was treated with IV Lasix with marked improvement. EKG showed nonspecific changes. Troponin was mildly elevated. She had an echocardiogram done which showed an ejection fraction of 55-60% with moderate left ventricular hypertrophy and grade 1 diastolic dysfunction.  Nuclear stress test in January  showed no evidence of ischemia with normal ejection fraction. Renal artery duplex ultrasound showed no evidence of renal artery stenosis.  She does have chronic kidney disease. She has been doing very well and denies any chest pain or shortness of breath. She continues to have lower extremity edema. Weight is stable.   Allergies  Allergen Reactions  . Codeine      Current Outpatient Prescriptions on File Prior to Visit  Medication Sig Dispense Refill  . amLODipine (NORVASC) 5 MG tablet Take 1 tablet (5 mg total) by mouth daily.  30 tablet  6  . aspirin 81 MG tablet Take 81 mg by mouth 2 (two) times daily.       . carvedilol (COREG) 12.5 MG tablet Take 1 tablet (12.5 mg total) by mouth 2 (two) times daily.  60 tablet  6  . clonazePAM (KLONOPIN) 0.5 MG tablet Takes 1/2 tablet to 1 tablet as needed.      . furosemide (LASIX) 20 MG tablet Take 20 mg by mouth 2 (two) times daily.      . insulin lispro protamine-lispro (HUMALOG 75/25 MIX) (75-25) 100 UNIT/ML SUSP injection Inject into the skin 2 (two) times daily with a meal.      . isosorbide mononitrate (IMDUR) 30 MG 24 hr tablet Take 30 mg by mouth daily.      . Olopatadine HCl (PATADAY) 0.2 %  SOLN Apply 1 drop to eye daily.      [name] omeprazole (PRILOSEC) 20 MG capsule Take 20 mg by mouth daily.      . quinapril (ACCUPRIL) 40 MG tablet Take 40 mg by mouth daily.       . simvastatin (ZOCOR) 40 MG tablet Take 40 mg by mouth daily.      . traMADol (ULTRAM) 50 MG tablet Take by mouth every 4 (four) hours as needed.      . travoprost, benzalkonium, (TRAVATAN) 0.004 % ophthalmic solution Place 1 drop into the left eye at bedtime.      . traZODone (DESYREL) 100 MG tablet Take 100 mg by mouth at bedtime.       No current facility-administered medications on file prior to visit.     Past Medical History  Diagnosis Date  . Diabetes mellitus without complication   . Hypertension   . Obesity   . Acute heart failure   . MI (myocardial infarction)   . Hyperlipidemia   . Chronic low back pain      Past Surgical History  Procedure Laterality Date  . Vaginal hysterectomy    . Knee surgery    . Eye surgery    . Thyroid surgery       Family History  Problem Relation Age of Onset  . Heart attack Mother  History   Social History  . Marital Status: Widowed    Spouse Name: N/A    Number of Children: N/A  . Years of Education: N/A   Occupational History  . Not on file.   Social History Main Topics  . Smoking status: Former Smoker    Types: Cigarettes  . Smokeless tobacco: Not on file  . Alcohol Use: No  . Drug Use: No  . Sexual Activity: Not on file   Other Topics Concern  . Not on file   Social History Narrative  . No narrative on file     PHYSICAL EXAM   BP 102/60  Pulse 62  Ht 5\' 3"  (1.6 m)  Wt 156 lb 4 oz (70.875 kg)  BMI 27.69 kg/m2 Constitutional: She is oriented to person, place, and time. She appears well-developed and well-nourished. No distress.  HENT: No nasal discharge.  Head: Normocephalic and atraumatic.  Eyes: Pupils are equal and round. No discharge.  Neck: Normal range of motion. Neck supple. No JVD present. No thyromegaly present.    Cardiovascular: Normal rate, regular rhythm, normal heart sounds. Exam reveals no gallop and no friction rub. No murmur heard.  Pulmonary/Chest: Effort normal and breath sounds normal. No stridor. No respiratory distress. She has no wheezes. She has no rales. She exhibits no tenderness.  Abdominal: Soft. Bowel sounds are normal. She exhibits no distension. There is no tenderness. There is no rebound and no guarding.  Musculoskeletal: Normal range of motion. She exhibits +1 edema and no tenderness.  Neurological: She is alert and oriented to person, place, and time. Coordination normal.  Skin: Skin is warm and dry. No rash noted. She is not diaphoretic. No erythema. No pallor.  Psychiatric: She has a normal mood and affect. Her behavior is normal. Judgment and thought content normal.        ASSESSMENT AND PLAN

## 2013-11-15 NOTE — Patient Instructions (Signed)
Your physician has recommended you make the following change in your medication:  Stop amlodipine   Your physician recommends that you schedule a follow-up appointment in:  3 months

## 2013-11-15 NOTE — Assessment & Plan Note (Signed)
She is doing very well with significant improvement in symptoms. Biggest issue continues to be lower extremity edema. Given that her blood pressure is on the low side, I discontinued amlodipine which might be contributing. Continue other medications. Continue low sodium diet.

## 2014-01-05 ENCOUNTER — Other Ambulatory Visit: Payer: Self-pay | Admitting: Cardiovascular Disease

## 2014-01-09 ENCOUNTER — Other Ambulatory Visit: Payer: Self-pay | Admitting: *Deleted

## 2014-01-09 NOTE — Telephone Encounter (Signed)
Error

## 2014-01-13 ENCOUNTER — Other Ambulatory Visit: Payer: Self-pay | Admitting: *Deleted

## 2014-01-13 MED ORDER — CARVEDILOL 12.5 MG PO TABS: ORAL_TABLET | ORAL | Status: DC

## 2014-01-14 ENCOUNTER — Other Ambulatory Visit: Payer: Self-pay | Admitting: *Deleted

## 2014-01-14 MED ORDER — CARVEDILOL 12.5 MG PO TABS: ORAL_TABLET | ORAL | Status: DC

## 2014-01-14 MED ORDER — QUINAPRIL HCL 40 MG PO TABS: 40.0000 mg | ORAL_TABLET | Freq: Every day | ORAL | Status: DC

## 2014-01-14 MED ORDER — FUROSEMIDE 20 MG PO TABS: 20.0000 mg | ORAL_TABLET | Freq: Two times a day (BID) | ORAL | Status: DC

## 2014-01-14 MED ORDER — ISOSORBIDE MONONITRATE ER 30 MG PO TB24: 30.0000 mg | ORAL_TABLET | Freq: Every day | ORAL | Status: DC

## 2014-01-14 NOTE — Telephone Encounter (Signed)
Requested Prescriptions   Signed Prescriptions Disp Refills  . furosemide (LASIX) 20 MG tablet 180 tablet 3    Sig: Take 1 tablet (20 mg total) by mouth 2 (two) times daily.    Authorizing Provider: Kathlyn Sacramento A    Ordering User: Othelia Pulling C  . isosorbide mononitrate (IMDUR) 30 MG 24 hr tablet 90 tablet 3    Sig: Take 1 tablet (30 mg total) by mouth daily.    Authorizing Provider: Kathlyn Sacramento A    Ordering User: Eugenio Hoes, Wednesday Ericsson C  . quinapril (ACCUPRIL) 40 MG tablet 90 tablet 3    Sig: Take 1 tablet (40 mg total) by mouth daily.    Authorizing Provider: Kathlyn Sacramento A    Ordering User: Eugenio Hoes, Nikya Busler C  . carvedilol (COREG) 12.5 MG tablet 180 tablet 3    Sig: TAKE 1 TABLET BY MOUTH TWICE A DAY    Authorizing Provider: Kathlyn Sacramento A    Ordering User: Britt Bottom

## 2014-02-17 ENCOUNTER — Encounter: Payer: Self-pay | Admitting: Cardiovascular Disease

## 2014-02-17 ENCOUNTER — Ambulatory Visit (INDEPENDENT_AMBULATORY_CARE_PROVIDER_SITE_OTHER): Payer: PRIVATE HEALTH INSURANCE | Admitting: Cardiovascular Disease

## 2014-02-17 VITALS — BP 118/70 | HR 74 | Ht 63.0 in | Wt 159.2 lb

## 2014-02-17 DIAGNOSIS — I10 Essential (primary) hypertension: Secondary | ICD-10-CM

## 2014-02-17 DIAGNOSIS — I5032 Chronic diastolic (congestive) heart failure: Secondary | ICD-10-CM

## 2014-02-17 NOTE — Patient Instructions (Signed)
Your physician has recommended you make the following change in your medication:  Stop Imdur (Isosorbide)   Your physician wants you to follow-up in: 6 months with Dr. Fletcher Anon. You will receive a reminder letter in the mail two months in advance. If you don't receive a letter, please call our office to schedule the follow-up appointment.  Your next appointment will be scheduled in our new office located at :  [facility]  [address] [place]  [place], [place] [address]

## 2014-02-17 NOTE — Assessment & Plan Note (Signed)
Blood pressure is somewhat on the low side. Given that the ejection fraction is normal with no evidence of angina, she does not necessarily need to be on long acting nitroglycerin. Thus, I discontinued Imdur.

## 2014-02-17 NOTE — Assessment & Plan Note (Signed)
She is overall doing very well with no evidence of fluid overload. Continue current dose of Lasix 20 mg twice daily. Blood pressure is optimally controlled.

## 2014-02-17 NOTE — Progress Notes (Signed)
Primary care physician: Dr. [name]  HPI  This is a pleasant 80 year old African American female who is here today for a followup visit regarding chronic diastolic heart failure . Lori Stanley has known history of hypertension and diabetes. Lori Stanley presented to [facility] in January,2015  with acute onset of shortness of breath. Lori Stanley was noted to be hypoxic and hypertensive with initial systolic blood pressure of 220 mm mercury. Lori Stanley was in moderate respiratory distress. Lori Stanley was treated with IV Lasix with marked improvement. EKG showed nonspecific changes. Troponin was mildly elevated. Lori Stanley had an echocardiogram done which showed an ejection fraction of 55-60% with moderate left ventricular hypertrophy and grade 1 diastolic dysfunction.  Nuclear stress test in January  showed no evidence of ischemia with normal ejection fraction. Renal artery duplex ultrasound showed no evidence of renal artery stenosis.  Lori Stanley does have chronic kidney disease. Lori Stanley has been doing very well and denies any chest pain or shortness of breath. Lori Stanley was noted to have lower extremity edema during last visit. I stopped amlodipine with subsequent improvement. Lori Stanley is overall doing well.  Allergies  Allergen Reactions  . Codeine      Current Outpatient Prescriptions on File Prior to Visit  Medication Sig Dispense Refill  . aspirin 81 MG tablet Take 81 mg by mouth daily.       . carvedilol (COREG) 12.5 MG tablet TAKE 1 TABLET BY MOUTH TWICE A DAY  180 tablet  3  . clonazePAM (KLONOPIN) 0.5 MG tablet Takes 1/2 tablet to 1 tablet as needed.      . furosemide (LASIX) 20 MG tablet Take 1 tablet (20 mg total) by mouth 2 (two) times daily.  180 tablet  3  . HUMALOG MIX 75/25 KWIKPEN (75-25) 100 UNIT/ML Kwikpen       . insulin lispro protamine-lispro (HUMALOG 75/25 MIX) (75-25) 100 UNIT/ML SUSP injection Inject into the skin 2 (two) times daily with a meal.      . quinapril (ACCUPRIL) 40 MG tablet Take 1 tablet (40 mg total) by mouth daily.  90  tablet  3  . simvastatin (ZOCOR) 40 MG tablet Take 40 mg by mouth daily.      . traMADol (ULTRAM) 50 MG tablet Take by mouth every 4 (four) hours as needed.      . travoprost, benzalkonium, (TRAVATAN) 0.004 % ophthalmic solution Place 1 drop into the left eye at bedtime.      . traZODone (DESYREL) 100 MG tablet Take 100 mg by mouth at bedtime.       No current facility-administered medications on file prior to visit.     Past Medical History  Diagnosis Date  . Diabetes mellitus without complication   . Hypertension   . Obesity   . Acute heart failure   . MI (myocardial infarction)   . Hyperlipidemia   . Chronic low back pain      Past Surgical History  Procedure Laterality Date  . Vaginal hysterectomy    . Knee surgery    . Eye surgery    . Thyroid surgery       Family History  Problem Relation Age of Onset  . Heart attack Mother      History   Social History  . Marital Status: Widowed    Spouse Name: N/A    Number of Children: N/A  . Years of Education: N/A   Occupational History  . Not on file.   Social History Main Topics  . Smoking status: Former  Smoker    Types: Cigarettes  . Smokeless tobacco: Not on file  . Alcohol Use: No  . Drug Use: No  . Sexual Activity: Not on file   Other Topics Concern  . Not on file   Social History Narrative  . No narrative on file     PHYSICAL EXAM   BP 118/70  Pulse 74  Ht 5\' 3"  (1.6 m)  Wt 159 lb 4 oz (72.235 kg)  BMI 28.22 kg/m2 Constitutional: Lori Stanley is oriented to person, place, and time. Lori Stanley appears well-developed and well-nourished. No distress.  HENT: No nasal discharge.  Head: Normocephalic and atraumatic.  Eyes: Pupils are equal and round. No discharge.  Neck: Normal range of motion. Neck supple. No JVD present. No thyromegaly present.  Cardiovascular: Normal rate, regular rhythm, normal heart sounds. Exam reveals no gallop and no friction rub. No murmur heard.  Pulmonary/Chest: Effort normal and  breath sounds normal. No stridor. No respiratory distress. Lori Stanley has no wheezes. Lori Stanley has no rales. Lori Stanley exhibits no tenderness.  Abdominal: Soft. Bowel sounds are normal. Lori Stanley exhibits no distension. There is no tenderness. There is no rebound and no guarding.  Musculoskeletal: Normal range of motion. Lori Stanley exhibits +1 edema and no tenderness.  Neurological: Lori Stanley is alert and oriented to person, place, and time. Coordination normal.  Skin: Skin is warm and dry. No rash noted. Lori Stanley is not diaphoretic. No erythema. No pallor.  Psychiatric: Lori Stanley has a normal mood and affect. Her behavior is normal. Judgment and thought content normal.        ASSESSMENT AND PLAN

## 2014-02-26 ENCOUNTER — Ambulatory Visit (INDEPENDENT_AMBULATORY_CARE_PROVIDER_SITE_OTHER): Payer: PRIVATE HEALTH INSURANCE | Admitting: Ophthalmology

## 2014-02-26 DIAGNOSIS — H35033 Hypertensive retinopathy, bilateral: Secondary | ICD-10-CM | POA: Diagnosis not present

## 2014-02-26 DIAGNOSIS — E11311 Type 2 diabetes mellitus with unspecified diabetic retinopathy with macular edema: Secondary | ICD-10-CM | POA: Diagnosis not present

## 2014-02-26 DIAGNOSIS — I10 Essential (primary) hypertension: Secondary | ICD-10-CM | POA: Diagnosis not present

## 2014-02-26 DIAGNOSIS — H43813 Vitreous degeneration, bilateral: Secondary | ICD-10-CM

## 2014-02-26 DIAGNOSIS — E11351 Type 2 diabetes mellitus with proliferative diabetic retinopathy with macular edema: Secondary | ICD-10-CM

## 2014-05-06 DIAGNOSIS — B351 Tinea unguium: Secondary | ICD-10-CM | POA: Diagnosis not present

## 2014-05-06 DIAGNOSIS — E119 Type 2 diabetes mellitus without complications: Secondary | ICD-10-CM | POA: Diagnosis not present

## 2014-05-06 DIAGNOSIS — L851 Acquired keratosis [keratoderma] palmaris et plantaris: Secondary | ICD-10-CM | POA: Diagnosis not present

## 2014-05-13 DIAGNOSIS — E1140 Type 2 diabetes mellitus with diabetic neuropathy, unspecified: Secondary | ICD-10-CM | POA: Diagnosis not present

## 2014-05-13 DIAGNOSIS — I10 Essential (primary) hypertension: Secondary | ICD-10-CM | POA: Diagnosis not present

## 2014-05-13 DIAGNOSIS — E119 Type 2 diabetes mellitus without complications: Secondary | ICD-10-CM | POA: Diagnosis not present

## 2014-05-13 DIAGNOSIS — R32 Unspecified urinary incontinence: Secondary | ICD-10-CM | POA: Diagnosis not present

## 2014-05-13 DIAGNOSIS — R410 Disorientation, unspecified: Secondary | ICD-10-CM | POA: Diagnosis not present

## 2014-05-15 DIAGNOSIS — R0602 Shortness of breath: Secondary | ICD-10-CM | POA: Diagnosis not present

## 2014-05-15 DIAGNOSIS — I509 Heart failure, unspecified: Secondary | ICD-10-CM | POA: Diagnosis not present

## 2014-06-02 DIAGNOSIS — I509 Heart failure, unspecified: Secondary | ICD-10-CM | POA: Diagnosis not present

## 2014-06-15 DIAGNOSIS — R0602 Shortness of breath: Secondary | ICD-10-CM | POA: Diagnosis not present

## 2014-07-14 DIAGNOSIS — R0602 Shortness of breath: Secondary | ICD-10-CM | POA: Diagnosis not present

## 2014-07-23 ENCOUNTER — Telehealth: Payer: Self-pay | Admitting: *Deleted

## 2014-07-23 NOTE — Telephone Encounter (Signed)
[employer] is calling asking [place] to call them back needs [place] to verify that pt is CHF  This is for [facility] heart failure program.  That they will be sending [place] stuff for they will monitor her weight and BP and so forth.  They just need [place] to know and verify this.  Please call.

## 2014-07-23 NOTE — Telephone Encounter (Signed)
Yes, she has chronic diastolic heart failure.

## 2014-07-24 DIAGNOSIS — Z0000 Encounter for general adult medical examination without abnormal findings: Secondary | ICD-10-CM | POA: Diagnosis not present

## 2014-07-24 DIAGNOSIS — E119 Type 2 diabetes mellitus without complications: Secondary | ICD-10-CM | POA: Diagnosis not present

## 2014-07-25 NOTE — Telephone Encounter (Signed)
Informed [employer] of Dr Jacklynn Ganong response  Patient verbalized understanding

## 2014-08-06 DIAGNOSIS — B351 Tinea unguium: Secondary | ICD-10-CM | POA: Diagnosis not present

## 2014-08-06 DIAGNOSIS — E119 Type 2 diabetes mellitus without complications: Secondary | ICD-10-CM | POA: Diagnosis not present

## 2014-08-06 DIAGNOSIS — L851 Acquired keratosis [keratoderma] palmaris et plantaris: Secondary | ICD-10-CM | POA: Diagnosis not present

## 2014-08-14 DIAGNOSIS — R0602 Shortness of breath: Secondary | ICD-10-CM | POA: Diagnosis not present

## 2014-08-23 NOTE — H&P (Signed)
PATIENT NAME:  Lori Stanley, Lori Stanley MR#:  [id number] DATE OF BIRTH:  Dec 10, 1931  DATE OF ADMISSION:  05/12/2013  PRIMARY CARE PHYSICIAN:  Dr. [name].  REFERRING PHYSICIAN:  Dr. Kerman Passey.  CHIEF COMPLAINT:  Shortness of breath.   HISTORY OF PRESENT ILLNESS:  The patient is a 81 year old African American female presenting to the ER with a chief complaint of a two day history of shortness of breath.  Denies any chest pain, dizziness.  Denies any headache, blurry vision.  Denies any weakness in her extremities.  No slurry speech or dysphagia.  The patient's blood pressure initially is very high at 238/127 with pulse 114.  The patient is placed on BiPAP.  The patient has reported that her sister brought her in to the ER for shortness of breath.  The patient is placed on BiPAP with 50% FiO2 in the ER.  Troponin is elevated at 0.20 and BNP is high at 1091.  The patient has received Lasix 40 mg IV x 2, nitroglycerin paste to the anterior chest wall and labetalol 20 mg IV was given to control her blood pressure.  Eventually, blood pressure dropped down to 172/96.  During my examination, the patient denies any chest pain, no blurry vision.  Her shortness of breath is significantly improved on BiPAP.  No family members at bedside.  No similar complaints in the past.  Never seen by any cardiologist and never had any stress test done.  Denies any swelling in her feet.  No nausea, vomiting or abdominal pain.   PAST MEDICAL HISTORY:  Hypertension, diabetes mellitus, hyperlipidemia, chronic low back pain.   PAST SURGICAL HISTORY:  Eye surgery, knee surgery.   ALLERGIES:  CODEINE.   PSYCHOSOCIAL HISTORY:  Lives at home, lives alone.  She used to smoke, but quit smoking in 1997, started smoking at a young age at approximately when she was 81 years old.  Smoked for approximately 40 to 50 years, but she used to smoke only 1 pack in the whole week.  Denies alcohol or illicit drug usage.   FAMILY HISTORY:  Mother has  history of hypertension and diabetes mellitus.   HOME MEDICATIONS:  Ultram 50 mg as needed for pain, simvastatin 40 mg by mouth at bedtime, quinapril 40 mg by mouth once daily, omeprazole 20 mg once a day, hydrochlorothiazide 25 mg once daily, Humalog - units in a.m. and 55 units before supper, glimepiride 4 mg by mouth once daily, Coreg 6.25 mg 2 times a day, clonidine 0.5 mg 1/2 tablet twice daily.   REVIEW OF SYSTEMS: CONSTITUTIONAL:  Denies any fever or fatigue.  EYES:  Denies blurry vision, double vision.  EARS, NOSE, THROAT:  Denies epistaxis, discharge.  RESPIRATION:  Denies cough, COPD.  CARDIOVASCULAR:  Denies chest pain, but complaining of shortness of breath.  Denies palpitations.  GASTROINTESTINAL:  No nausea, vomiting, diarrhea, abdominal pain.  GENITOURINARY:  No dysuria, hematuria.   GYNECOLOGIC AND BREAST:  Denies breast mass or vaginal discharge.  ENDOCRINE:  Denies polyuria, nocturia or thyroid problems.  HEMATOLOGIC AND LYMPHATIC:  No anemia, easy bruising, bleeding.  INTEGUMENTARY:  No acne, rash, lesions.  MUSCULOSKELETAL:  No joint pain in the neck or shoulder.  Complaining of chronic low back pain.  NEUROLOGIC:  No vertigo or ataxia.  PSYCHIATRIC:  No ADD or OCD.   PHYSICAL EXAMINATION:  VITAL SIGNS:  Temperature afebrile, pulse 86, respirations 22 to 30, blood pressure 172/96, pulse ox is 100% on BiPAP.  HEENT:  Normocephalic, atraumatic.  Pupils  are equally reacting to light and accommodation.  No scleral icterus.  No conjunctival injection.  No sinus tenderness.  Moist mucous membranes.  NECK:  Supple.  No JVD.  No thyromegaly.  LUNGS:  Clear to auscultation bilaterally.  No accessory muscle usage.  No anterior chest wall tenderness on palpation.  CARDIAC:  S1, S2 normal.  Regular rate and rhythm.  No murmurs.  GASTROINTESTINAL:  Soft.  Bowel sounds are positive in all four quadrants.  Nontender, nondistended.  No hepatosplenomegaly.  No masses felt.  NEUROLOGIC:   Awake, alert, oriented x 3.  Motor and sensory are grossly intact.  Reflexes are 2+.  EXTREMITIES:  Trace edema is present.  No cyanosis.  No clubbing.  Peripheral pulses are 2+.  MUSCULOSKELETAL:  No joint effusion, tenderness, erythema.  PSYCHIATRIC:  Normal mood and affect.   LABORATORY AND IMAGING STUDIES:  WBC 5.9.  The rest of the CBC is normal.  Urinalysis, glucose greater than 500, leukocyte esterase and nitrites are negative.  LFTs are normal except AST which is elevated at 46, CK total is 1555, CPK-MB 9.6, troponin 0.20, glucose 275, BNP 1091, BUN 25, creatinine 1.46, sodium 139, potassium 4.1, chloride 104, CO2 33, GFR 34.  Anion gap 2, serum osmolality 292, calcium 8.8.  Chest x-ray, no acute disease.  A 12-lead EKG:  Sinus tachycardia at 140 beats per minute.  Normal PR and QRS interval.  No acute ST-T changes.   ASSESSMENT AND PLAN:  A 81 year old African American female brought into the ER by her sister for a two day history of shortness of breath, but denies any chest pain will be admitted with following assessment and plan:  1.  Malignant hypertension, probably from stress and insomnia.  We will admit her to CCU Stepdown.  The patient is on Coreg, hydrochlorothiazide, ACE inhibitor.  We will add amlodipine.  We will provide her Lopressor 5 mg IV as needed for elevated blood pressure.  Nitro paste to the anterior chest wall.  Up-titrate medications as needed basis.  2.  Acute respiratory distress with elevated troponin, probably from malignant hypertension versus demand ischemia.  We will rule out non-ST-elevation myocardial infarction as well.  The patient currently is denying any chest pain.  We will implement acute coronary syndrome protocol with oxygen, nitroglycerin, aspirin, beta blocker and statin.  Cycle cardiac biomarkers.  Cardiology consult is placed to Dr. Tyrell Antonio group.  3.  Diabetes mellitus.  The patient will be on insulin sliding scale as she is nothing by mouth at this  time.  We keep her nothing by mouth while monitoring for troponin trend.  4.  Hyperlipidemia.  The patient will be on statin.  5.  We will provide gastrointestinal and deep vein thrombosis prophylaxis.   Diagnosis and plan of care was discussed in detail with the patient.  She is aware of the plan.   CODE STATUS:  SHE IS FULL CODE.  Sister is the medical power of attorney.  All her questions were answered to her satisfaction.     Total time spent on admission is 50 minutes.     ____________________________ Nicholes Mango, MD ag:ea D: 05/12/2013 01:34:31 ET T: 05/12/2013 01:51:07 ET JOB#: [id number]  cc: Nicholes Mango, MD, <Dictator> Richard L. [name], MD Nicholes Mango MD ELECTRONICALLY SIGNED 05/23/2013 0:47

## 2014-08-23 NOTE — Consult Note (Signed)
PATIENT NAME:  Lori Stanley, Lori Stanley MR#:  [id number] DATE OF BIRTH:  1931/09/19  DATE OF CONSULTATION:  05/12/2013  REFERRING PHYSICIAN:  Dr. Margaretmary Eddy. CONSULTING PHYSICIAN:  Champ Mungo. Lovena Le, MD  INDICATION FOR CONSULTATION: Evaluation of acute onset heart failure in the setting of non-ST elevation MI and malignant hypertension.   HISTORY OF PRESENT ILLNESS: The patient is a very pleasant 81 year old woman with a long standing history of diabetes and hypertension and obesity. She was in her usual state of health until the day prior to admission when she developed increasing shortness of breath and presented to the emergency room for additional evaluation. The patient does not have any history of dietary or medication noncompliance. She denied any peripheral edema. She denies abusing sodium. Her initial blood pressure was greater than 220, and was in moderate respiratory distress when she presented. She has been treated with IV Lasix and had marked improvement. Her initial echocardiogram demonstrated normal sinus rhythm with nonspecific ST-T wave abnormality and she was admitted for additional evaluation. Subsequent cardiac enzymes demonstrated very mild elevation of the troponin and CK-MB. She now is nearly back to baseline, although she still has some residual dyspnea.   PAST MEDICAL HISTORY: Noted for hypertension, diabetes, obesity.   PAST SURGICAL HISTORY: She had a history of hysterectomy in the past and a history of knee surgery in the past with chronic knee pain.   SOCIAL HISTORY: The patient denies tobacco or ethanol abuse. Denies any drug use.   FAMILY HISTORY: Negative for sudden cardiac death and otherwise noncontributory.  REVIEW OF SYSTEMS: Notable for chronic arthritic pain. She has mild polyuria, polydipsia. Otherwise all systems were reviewed and negative except as noted in the HPI. She denied any fevers.   PHYSICAL EXAMINATION:  IN GENERAL: She is a pleasant 81 year old woman in no  acute distress.  VITAL SIGNS: Blood pressure on my exam was 148/90, pulse was 80 and regular. The respirations were 22, temperature was 98.  HEENT: Normocephalic, atraumatic. Pupils are equal and round. The oropharynx was moist. The sclerae were anicteric.  NECK: Revealed 8 cm jugular venous distention. There is no thyromegaly. The trachea was midline. The carotids were 2+ and symmetric.  LUNGS: Revealed rales approximately 1/3 of the way up bilaterally, but no increased work of breathing, no wheezes, no rhonchi.  CARDIOVASCULAR: Regular rate and rhythm with normal S1 and S2. There was a soft S4 gallop present. There was a very soft systolic murmur heard best at the base.  ABDOMEN: Obese, nontender, nondistended. There was no obvious organomegaly. There was no rebound or guarding. EXTREMITIES: Demonstrated no cyanosis, clubbing, or edema. The pulses were 2+ and symmetric.  SKIN: Normal.  NEUROLOGICAL: Alert and oriented x 3 with  the cranial nerves  intact. The patient was somewhat anxious.   EKG is as previously noted. Chest x-ray demonstrated pulmonary edema.  LABORATORIES: Notable for elevated CK-MB and troponins. Her hemoglobin was 14. Her liver panel was negative except for a mild elevation in the AST of 46. Her creatinine  was 1.5.  IMPRESSION:  1. Acute heart failure, unclear whether it is systolic, diastolic or both.  2. Non-ST elevation myocardial infarction based on elevated troponin, possibly or perhaps likely secondary to malignant hypertension.  3. Malignant hypertension. 4. Diabetes. 5. Obesity.  DISCUSSION: The patient has been admitted and has improved markedly with a decrease in blood pressure after diuretic therapy and nitrates. She had a 2-D echo pending. If she has any left ventricular dysfunction, then left  and right heart catheterization will be indicated. If her left ventricular function is preserved, then noninvasive evaluation could be considered, although she does have  multiple cardiac risk factors. My partner Dr. Geraldine Contras  will assume care tomorrow. Continue to keep the patient in the ICU for now.   ____________________________ Champ Mungo. Lovena Le, MD gwt:sg D: 05/12/2013 11:10:00 ET T: 05/12/2013 13:02:39 ET JOB#: [id number]  cc: Champ Mungo. Lovena Le, MD, <Dictator> Dr. Cristopher Peru, M.D. ELECTRONICALLY SIGNED 06/18/2013 15:47

## 2014-08-23 NOTE — Discharge Summary (Signed)
PATIENT NAME:  Lori Stanley, Lori Stanley MR#:  [id number] DATE OF BIRTH:  September 30, 1931  DATE OF ADMISSION:  05/12/2013 DATE OF DISCHARGE:  05/15/2013  ADMISSION DIAGNOSIS: Malignant hypertension.   DISCHARGE DIAGNOSES: 1.  Malignant hypertension.  2.  Acute on chronic diastolic heart failure with acute respiratory failure present on admission.  3.  Elevated troponins from demand ischemia. 4.  Acute renal failure.  5.  Diabetes. 6.  Shortness of breath with acute respiratory failure secondary to malignant hypertension, acute diastolic heart failure, and reactive airway disease with acute bronchitis.   CONSULTATIONS: Cardiology.   LABORATORY AND RADIOLOGICAL DATA: At discharge, sodium 132, potassium 4.0, chloride 99, bicarbonate 28, BUN 80, creatinine 2.27. Glucose is 95.   Renal ultrasound showed a 9 mm diameter cortical cyst laterally in the mid to lower pole of the right kidney. Follow-up triphasic renal CT or MRI is recommended.   2-D echocardiogram: EF of 55% to 60% with moderate concentric LVH and grade 1 diastolic dysfunction.   LDL is 73 cholesterol 137, HDL 43. A1c is 8.5. TSH 0.931. Troponin max 0.20, at discharge is 0.19.   HOSPITAL COURSE: This 81 year old female presented with malignant hypertension and shortness of breath. For further details, please refer to the H and P.  1.  Malignant hypertension, likely secondary to acute on chronic diastolic heart failure: The patient was placed on p.o. medications and hydralazine p.r.n. She tolerated this well. Blood pressure has been tolerated. We did hold her ACE inhibitor and HCTZ due to acute renal failure.  2.  Acute on chronic diastolic heart failure with acute respiratory failure, which was present on admission: Her echo showed a preserved EF, but she has diastolic dysfunction. Suspect malignant hypertension as etiology of heart failure. She was diuresed well.  3.  Elevated troponin from demand ischemia from malignant hypertension:  Appreciate cardiology evaluation. The patient will need outpatient stress test in the near future due to risk factors for CAD. She was continued on aspirin, Coreg, statin. 4.  Acute renal failure: A combination of Lasix, CT scan contrast, and malignant hypertension with component of ATN. Renal ultrasound showed no hydronephrosis. It did show a small cyst and I was going to obtain an MRI, but the patient could not stand an MRI, so we ask that primary care physician please repeat this as an outpatient or perform another renal ultrasound or CT scan. Her creatinine has been trending down, although it is still slightly elevated. Will continue to hold her ACE inhibitor, Lasix, and nephrotoxic agents.  5.  Diabetes: A1c is 8.5. Dr. [name] will need to have to follow up for her diabetes.  6.  Shortness of breath with acute respiratory failure, present on admission, multifactorial from malignant hypertension, acute diastolic heart failure on chronic, and reactive airway disease: The patient was on antibiotics, steroids, oxygen. Her CT chest was negative for PE, pneumonia, and pulmonary edema.   DISCHARGE MEDICATIONS:  1.  Clonazepam 0.5 mg 1/2 tablet b.i.d.  2.  Trazodone 100 mg daily.  3.  Simvastatin 40 mg daily.  4.  Humalog 75/25, 55 units b.i.d. 5.  Omeprazole 20 mg daily.  6.  Coreg 6.25 b.i.d.  7.  Prednisone starting at 60 mg, taper by 10 mg every 2 days.  8.  Imdur 30 mg daily.  9.  Amlodipine 10 mg daily.  10.  Aspirin 81 mg daily.  11.  Azithromycin 250 mg for 2 days.  12.  Glimepiride 4 mg daily.  We have held the  HCTZ, lisinopril, and glimepiride until seen by primary doctor. No NSAIDs or meds that affect her kidneys.  DISCHARGE OXYGEN: 2 liters.   DISCHARGE DIET: ADA, low-sodium diet.   DISCHARGE FOLLOWUP: The patient will follow up in 1 to 2 days with Dr. [name]. At that time, we also request that Dr. [name] follow up with a possible CT scan of the kidneys, as the patient did not  tolerate MRI, or repeat the renal ultrasound.   TIME SPENT: Approximately 35 minutes on her discharge.   ____________________________ Jamelle Noy P. Benjie Karvonen, MD spm:jcm D: 05/17/2013 14:14:23 ET T: 05/17/2013 15:37:11 ET JOB#: [id number]  cc: Drinda Belgard P. Benjie Karvonen, MD, <Dictator> Richard L. [name], MD Donell Beers Betsi Crespi MD ELECTRONICALLY SIGNED 05/17/2013 21:56

## 2014-08-23 NOTE — Consult Note (Signed)
Brief Consult Note: Diagnosis: acute heart failure exacerbation.   Patient was seen by consultant.   Consult note dictated.   Comments: The patient is a 81 yo woman admitted with acute systolic heart failure associated with malignant hypertension and NSTEMI which appears most likely to be secondary to her elevated blood pressure. She has been treated with IV diuretics and her blood pressure and sob are improved. Will await 2D echo. If EF is down, would suggest right and left heart cath, especially with h/o DM. For now continue treating blood pressure and fluid overload. Dr. Geraldine Contras to followup in the a.m.  Electronic Signatures: Cristopher Peru (MD)  (Signed 11-Jan-15 11:03)  Authored: Brief Consult Note   Last Updated: 11-Jan-15 11:03 by Cristopher Peru (MD)

## 2014-08-28 DIAGNOSIS — E119 Type 2 diabetes mellitus without complications: Secondary | ICD-10-CM | POA: Diagnosis not present

## 2014-08-28 DIAGNOSIS — I10 Essential (primary) hypertension: Secondary | ICD-10-CM | POA: Diagnosis not present

## 2014-08-28 DIAGNOSIS — K219 Gastro-esophageal reflux disease without esophagitis: Secondary | ICD-10-CM | POA: Diagnosis not present

## 2014-08-28 DIAGNOSIS — J309 Allergic rhinitis, unspecified: Secondary | ICD-10-CM | POA: Diagnosis not present

## 2014-08-29 ENCOUNTER — Encounter: Payer: Self-pay | Admitting: Cardiovascular Disease

## 2014-08-29 ENCOUNTER — Ambulatory Visit (INDEPENDENT_AMBULATORY_CARE_PROVIDER_SITE_OTHER): Payer: Medicare Other | Admitting: Cardiovascular Disease

## 2014-08-29 VITALS — BP 120/66 | HR 76 | Ht 63.0 in | Wt 156.5 lb

## 2014-08-29 DIAGNOSIS — I10 Essential (primary) hypertension: Secondary | ICD-10-CM

## 2014-08-29 DIAGNOSIS — I5032 Chronic diastolic (congestive) heart failure: Secondary | ICD-10-CM

## 2014-08-29 NOTE — Progress Notes (Signed)
Primary care physician: Dr. [name]  HPI  This is a pleasant 81 year old African American female who is here today for a followup visit regarding chronic diastolic heart failure . She has known history of hypertension and diabetes. She [facility] in January,2015  with acute onset of shortness of breath. She was noted to be hypoxic and hypertensive with initial systolic blood pressure of 220 mm mercury. She was in moderate respiratory distress. She was treated with IV Lasix with marked improvement. EKG showed nonspecific changes. Troponin was mildly elevated. She had an echocardiogram done which showed an ejection fraction of 55-60% with moderate left ventricular hypertrophy and grade 1 diastolic dysfunction.  Nuclear stress test in January  showed no evidence of ischemia with normal ejection fraction. Renal artery duplex ultrasound showed no evidence of renal artery stenosis.  She does have chronic kidney disease. She has been doing very well and denies any chest pain or shortness of breath. She had some recent dry cough and sinus congestion. Bilateral leg edema is stable.  Allergies  Allergen Reactions  . Codeine      Current Outpatient Prescriptions on File Prior to Visit  Medication Sig Dispense Refill  . acetaminophen (TYLENOL) 500 MG tablet Take 1,000 mg by mouth every 6 (six) hours as needed.    [name] aspirin 81 MG tablet Take 81 mg by mouth daily.     . Bepotastine Besilate (BEPREVE) 1.5 % SOLN Place 1 drop into both eyes daily.    . carvedilol (COREG) 12.5 MG tablet TAKE 1 TABLET BY MOUTH TWICE A DAY 180 tablet 3  . clonazePAM (KLONOPIN) 0.5 MG tablet Takes 1/2 tablet to 1 tablet as needed.    . furosemide (LASIX) 20 MG tablet Take 1 tablet (20 mg total) by mouth 2 (two) times daily. 180 tablet 3  . insulin lispro protamine-lispro (HUMALOG 75/25 MIX) (75-25) 100 UNIT/ML SUSP injection Inject into the skin 2 (two) times daily with a meal.    . quinapril (ACCUPRIL) 40 MG tablet  Take 1 tablet (40 mg total) by mouth daily. 90 tablet 3  . simvastatin (ZOCOR) 40 MG tablet Take 40 mg by mouth daily.    . travoprost, benzalkonium, (TRAVATAN) 0.004 % ophthalmic solution Place 1 drop into the left eye at bedtime.    . traZODone (DESYREL) 100 MG tablet Take 100 mg by mouth at bedtime.     No current facility-administered medications on file prior to visit.     Past Medical History  Diagnosis Date  . Diabetes mellitus without complication   . Hypertension   . Obesity   . Acute heart failure   . MI (myocardial infarction)   . Hyperlipidemia   . Chronic low back pain      Past Surgical History  Procedure Laterality Date  . Vaginal hysterectomy    . Knee surgery    . Eye surgery    . Thyroid surgery       Family History  Problem Relation Age of Onset  . Heart attack Mother      History   Social History  . Marital Status: Widowed    Spouse Name: N/A  . Number of Children: N/A  . Years of Education: N/A   Occupational History  . Not on file.   Social History Main Topics  . Smoking status: Former Smoker    Types: Cigarettes  . Smokeless tobacco: Not on file  . Alcohol Use: No  . Drug Use: No  .  Sexual Activity: Not on file   Other Topics Concern  . Not on file   Social History Narrative     PHYSICAL EXAM   BP 120/66 mmHg  Pulse 76  Ht 5\' 3"  (1.6 m)  Wt 156 lb 8 oz (70.988 kg)  BMI 27.73 kg/m2 Constitutional: She is oriented to person, place, and time. She appears well-developed and well-nourished. No distress.  HENT: No nasal discharge.  Head: Normocephalic and atraumatic.  Eyes: Pupils are equal and round. No discharge.  Neck: Normal range of motion. Neck supple. No JVD present. No thyromegaly present.  Cardiovascular: Normal rate, regular rhythm, normal heart sounds. Exam reveals no gallop and no friction rub. There is one out of 6 systolic ejection murmur at the aortic area  Pulmonary/Chest: Effort normal and breath sounds  normal. No stridor. No respiratory distress. She has no wheezes. She has no rales. She exhibits no tenderness.  Abdominal: Soft. Bowel sounds are normal. She exhibits no distension. There is no tenderness. There is no rebound and no guarding.  Musculoskeletal: Normal range of motion. She exhibits +1 edema and no tenderness.  Neurological: She is alert and oriented to person, place, and time. Coordination normal.  Skin: Skin is warm and dry. No rash noted. She is not diaphoretic. No erythema. No pallor.  Psychiatric: She has a normal mood and affect. Her behavior is normal. Judgment and thought content normal.     [id number]  Rhythm  -  Nonspecific T-abnormality.   ABNORMAL     ASSESSMENT AND PLAN

## 2014-08-29 NOTE — Assessment & Plan Note (Signed)
She seems to be stable from a cardiac standpoint with no significant fluid overload. She does have chronic leg edema which seems to be stable. Continue current dose of furosemide. She has underlying chronic kidney disease and she gets her labs done with Dr. [name].

## 2014-08-29 NOTE — Assessment & Plan Note (Signed)
Blood pressure is now optimally controlled on current medications.

## 2014-08-29 NOTE — Patient Instructions (Signed)
Medication Instructions:  None  Labwork: None  Testing/Procedures: None  Follow-Up: Your physician wants you to follow-up in: 6 months with Dr. Fletcher Anon.  You will receive a reminder letter in the mail two months in advance. If you don't receive a letter, please call our office to schedule the follow-up appointment.   Any Other Special Instructions Will Be Listed Below (If Applicable).

## 2014-09-02 ENCOUNTER — Ambulatory Visit (INDEPENDENT_AMBULATORY_CARE_PROVIDER_SITE_OTHER): Payer: PRIVATE HEALTH INSURANCE | Admitting: Ophthalmology

## 2014-09-11 ENCOUNTER — Ambulatory Visit (INDEPENDENT_AMBULATORY_CARE_PROVIDER_SITE_OTHER): Payer: Medicare Other | Admitting: Ophthalmology

## 2014-09-11 DIAGNOSIS — E11319 Type 2 diabetes mellitus with unspecified diabetic retinopathy without macular edema: Secondary | ICD-10-CM | POA: Diagnosis not present

## 2014-09-11 DIAGNOSIS — I10 Essential (primary) hypertension: Secondary | ICD-10-CM

## 2014-09-11 DIAGNOSIS — E11359 Type 2 diabetes mellitus with proliferative diabetic retinopathy without macular edema: Secondary | ICD-10-CM

## 2014-09-11 DIAGNOSIS — H35033 Hypertensive retinopathy, bilateral: Secondary | ICD-10-CM

## 2014-09-13 DIAGNOSIS — R0602 Shortness of breath: Secondary | ICD-10-CM | POA: Diagnosis not present

## 2014-10-14 DIAGNOSIS — R0602 Shortness of breath: Secondary | ICD-10-CM | POA: Diagnosis not present

## 2014-10-15 ENCOUNTER — Telehealth: Payer: Self-pay | Admitting: Family Medicine

## 2014-10-15 ENCOUNTER — Other Ambulatory Visit: Payer: Self-pay

## 2014-10-15 MED ORDER — INSULIN LISPRO PROT & LISPRO (75-25 MIX) 100 UNIT/ML ~~LOC~~ SUSP: 60.0000 [IU] | Freq: Two times a day (BID) | SUBCUTANEOUS | Status: DC

## 2014-10-15 NOTE — Telephone Encounter (Signed)
Pt contacted office for refill request on the following medications:  insulin lispro protamine-lispro (HUMALOG 75/25 MIX) (75-25) 100 UNIT/ML SUSP injection.  [facility]  [id number] Pt states she is completely out.

## 2014-10-16 ENCOUNTER — Other Ambulatory Visit: Payer: Self-pay

## 2014-10-16 ENCOUNTER — Other Ambulatory Visit: Payer: Self-pay | Admitting: Family Medicine

## 2014-10-16 DIAGNOSIS — E118 Type 2 diabetes mellitus with unspecified complications: Secondary | ICD-10-CM

## 2014-10-16 MED ORDER — INSULIN LISPRO PROT & LISPRO (75-25 MIX) 100 UNIT/ML KWIKPEN: 60.0000 [IU] | PEN_INJECTOR | Freq: Two times a day (BID) | SUBCUTANEOUS | Status: DC

## 2014-10-16 MED ORDER — INSULIN LISPRO PROT & LISPRO (75-25 MIX) 100 UNIT/ML ~~LOC~~ SUSP: 60.0000 [IU] | Freq: Two times a day (BID) | SUBCUTANEOUS | Status: DC

## 2014-11-04 DIAGNOSIS — H4011X2 Primary open-angle glaucoma, moderate stage: Secondary | ICD-10-CM | POA: Diagnosis not present

## 2014-11-05 DIAGNOSIS — B351 Tinea unguium: Secondary | ICD-10-CM | POA: Diagnosis not present

## 2014-11-05 DIAGNOSIS — L851 Acquired keratosis [keratoderma] palmaris et plantaris: Secondary | ICD-10-CM | POA: Diagnosis not present

## 2014-11-05 DIAGNOSIS — E119 Type 2 diabetes mellitus without complications: Secondary | ICD-10-CM | POA: Diagnosis not present

## 2014-11-13 DIAGNOSIS — R0602 Shortness of breath: Secondary | ICD-10-CM | POA: Diagnosis not present

## 2014-11-24 ENCOUNTER — Telehealth: Payer: Self-pay | Admitting: Family Medicine

## 2014-11-24 NOTE — Telephone Encounter (Signed)
Dr Darnell Level, Patient needs refill of Clonazepam ED

## 2014-11-24 NOTE — Telephone Encounter (Signed)
Pt contacted office for refill request on the following medications:  clonazePAM (KLONOPIN) 0.5 MG tablet.  [facility].  [id number]

## 2014-11-24 NOTE — Telephone Encounter (Signed)
Okay to put 5 refills. Thank you

## 2014-11-24 NOTE — Telephone Encounter (Signed)
See below. Thank you-aa

## 2014-12-04 ENCOUNTER — Other Ambulatory Visit: Payer: Self-pay | Admitting: Family Medicine

## 2014-12-09 DIAGNOSIS — H4011X2 Primary open-angle glaucoma, moderate stage: Secondary | ICD-10-CM | POA: Diagnosis not present

## 2014-12-11 ENCOUNTER — Other Ambulatory Visit: Payer: Self-pay | Admitting: *Deleted

## 2014-12-11 MED ORDER — CARVEDILOL 12.5 MG PO TABS: ORAL_TABLET | ORAL | Status: DC

## 2014-12-14 DIAGNOSIS — R0602 Shortness of breath: Secondary | ICD-10-CM | POA: Diagnosis not present

## 2014-12-15 ENCOUNTER — Other Ambulatory Visit: Payer: Self-pay

## 2014-12-15 MED ORDER — LORATADINE 10 MG PO TABS: 10.0000 mg | ORAL_TABLET | Freq: Every day | ORAL | Status: DC

## 2014-12-15 MED ORDER — ASPIRIN 81 MG PO TABS: 81.0000 mg | ORAL_TABLET | Freq: Every day | ORAL | Status: DC

## 2014-12-15 MED ORDER — CARVEDILOL 12.5 MG PO TABS: ORAL_TABLET | ORAL | Status: DC

## 2014-12-15 NOTE — Telephone Encounter (Signed)
Refill sent for carvedilol.  

## 2014-12-23 ENCOUNTER — Ambulatory Visit (INDEPENDENT_AMBULATORY_CARE_PROVIDER_SITE_OTHER): Payer: Medicare Other | Admitting: Family Medicine

## 2014-12-23 ENCOUNTER — Encounter: Payer: Self-pay | Admitting: Family Medicine

## 2014-12-23 VITALS — BP 110/60 | HR 78 | Temp 97.8°F | Resp 16 | Wt 159.0 lb

## 2014-12-23 DIAGNOSIS — E118 Type 2 diabetes mellitus with unspecified complications: Secondary | ICD-10-CM

## 2014-12-23 DIAGNOSIS — I10 Essential (primary) hypertension: Secondary | ICD-10-CM

## 2014-12-23 DIAGNOSIS — K219 Gastro-esophageal reflux disease without esophagitis: Secondary | ICD-10-CM | POA: Diagnosis not present

## 2014-12-23 DIAGNOSIS — E119 Type 2 diabetes mellitus without complications: Secondary | ICD-10-CM | POA: Insufficient documentation

## 2014-12-23 DIAGNOSIS — H409 Unspecified glaucoma: Secondary | ICD-10-CM | POA: Insufficient documentation

## 2014-12-23 LAB — POCT GLYCOSYLATED HEMOGLOBIN (HGB A1C): Hemoglobin A1C: 9.6

## 2014-12-23 MED ORDER — INSULIN LISPRO PROT & LISPRO (75-25 MIX) 100 UNIT/ML KWIKPEN: 60.0000 [IU] | PEN_INJECTOR | Freq: Two times a day (BID) | SUBCUTANEOUS | Status: DC

## 2014-12-23 MED ORDER — RANITIDINE HCL 150 MG PO CAPS: 150.0000 mg | ORAL_CAPSULE | Freq: Two times a day (BID) | ORAL | Status: DC

## 2014-12-23 NOTE — Progress Notes (Signed)
Patient ID: Lori Stanley, female   DOB: 1932/02/04, 81 y.o.   MRN: [id number]    Subjective:  HPI  Diabetes Mellitus Type II, Follow-up:   Lab Results  Component Value Date   HGBA1C 8.5* 05/13/2013    Last seen for diabetes 4 months ago.  Management changes included none. She reports good compliance with treatment. She is not having side effects.  Current symptoms include none Home blood sugar records: 90s- 150's sometimes 200  Episodes of hypoglycemia? no   Current Insulin Regimen: 60 units twice daily Most Recent Eye Exam: 12/15/14 Current exercise: none  Pertinent Labs:    Component Value Date/Time   CHOL 137 05/13/2013 0344   TRIG 104 05/13/2013 0344   CREATININE 2.27* 05/15/2013 0512   CREATININE 0.96 08/06/2009 0918    Wt Readings from Last 3 Encounters:  12/23/14 159 lb (72.122 kg)  08/29/14 156 lb 8 oz (70.988 kg)  02/17/14 159 lb 4 oz (72.235 kg)    ------------------------------------------------------------------------  GERD, Follow up:  The patient was last seen for GERD 4 months ago. Changes made since that visit include restarted Prontonix but pt called back because of abdominal pain.  She reports good compliance with treatment. She is having side effects. Abdominal pain.  She IS experiencing abdominal bloating. She is NOT experiencing heartburn  ------------------------------------------------------------------------   Hypertension, follow-up:  BP Readings from Last 3 Encounters:  12/23/14 110/60  08/29/14 120/66  02/17/14 118/70    She was last seen for hypertension 4 months ago.  BP at that visit was 128/66. Management changes since that visit include none. She reports good compliance with treatment. She is not having side effects.  She is not exercising. She is adherent to low salt diet.   Outside blood pressures are being checked at the drug stores every week. She is experiencing none.    Wt Readings from Last 3  Encounters:  12/23/14 159 lb (72.122 kg)  08/29/14 156 lb 8 oz (70.988 kg)  02/17/14 159 lb 4 oz (72.235 kg)    ------------------------------------------------------------------------      Prior to Admission medications   Medication Sig Start Date End Date Taking? Authorizing Provider  acetaminophen (TYLENOL) 500 MG tablet Take 1,000 mg by mouth every 6 (six) hours as needed.   Yes Historical Provider, MD  aspirin 81 MG tablet Take 1 tablet (81 mg total) by mouth daily. 12/15/14  Yes Richard Maceo Pro., MD  B-D UF III MINI PEN NEEDLES 31G X 5 MM MISC USE TWICE A DAY AS DIRECTED 12/05/14  Yes Jerrol Banana., MD  Bepotastine Besilate (BEPREVE) 1.5 % SOLN Place 1 drop into both eyes daily.   Yes Historical Provider, MD  carvedilol (COREG) 12.5 MG tablet TAKE 1 TABLET BY MOUTH TWICE A DAY 12/15/14  Yes Wellington Hampshire, MD  clonazePAM (KLONOPIN) 0.5 MG tablet Takes 1/2 tablet to 1 tablet as needed.   Yes Historical Provider, MD  furosemide (LASIX) 20 MG tablet Take 1 tablet (20 mg total) by mouth 2 (two) times daily. 01/14/14  Yes Wellington Hampshire, MD  Insulin Lispro Prot & Lispro (HUMALOG MIX 75/25 KWIKPEN) (75-25) 100 UNIT/ML Kwikpen Inject 60 Units into the skin 2 (two) times daily. 10/16/14  Yes Richard Maceo Pro., MD  loratadine (CLARITIN) 10 MG tablet Take 1 tablet (10 mg total) by mouth daily. 12/15/14  Yes Richard Maceo Pro., MD  quinapril (ACCUPRIL) 40 MG tablet Take 1 tablet (40 mg total) by mouth daily.  01/14/14  Yes Wellington Hampshire, MD  simvastatin (ZOCOR) 40 MG tablet Take 40 mg by mouth daily.   Yes Historical Provider, MD  travoprost, benzalkonium, (TRAVATAN) 0.004 % ophthalmic solution Place 1 drop into the left eye at bedtime.   Yes Historical Provider, MD  traZODone (DESYREL) 100 MG tablet Take 100 mg by mouth at bedtime.   Yes Historical Provider, MD  pantoprazole (PROTONIX) 40 MG tablet Take 40 mg by mouth daily.    Historical Provider, MD  timolol (TIMOPTIC) 0.5  % ophthalmic solution PLACE 1 DROP INTO LEFT EYE TWICE A DAY 11/04/14   Historical Provider, MD    Patient Active Problem List   Diagnosis Date Noted  . Glaucoma 12/23/2014  . Diabetes 12/23/2014  . GERD (gastroesophageal reflux disease) 12/23/2014  . Essential hypertension, malignant 05/24/2013  . SOB (shortness of breath) 05/24/2013  . Chronic diastolic heart failure 01/75/1025    Past Medical History  Diagnosis Date  . Diabetes mellitus without complication   . Hypertension   . Obesity   . Acute heart failure   . MI (myocardial infarction)   . Hyperlipidemia   . Chronic low back pain     Social History   Social History  . Marital Status: Widowed    Spouse Name: N/A  . Number of Children: N/A  . Years of Education: N/A   Occupational History  . Not on file.   Social History Main Topics  . Smoking status: Former Smoker    Types: Cigarettes  . Smokeless tobacco: Not on file  . Alcohol Use: No  . Drug Use: No  . Sexual Activity: Not on file   Other Topics Concern  . Not on file   Social History Narrative    Allergies  Allergen Reactions  . Codeine     Review of Systems  Constitutional: Negative.   Eyes: Negative.   Respiratory: Negative.   Cardiovascular: Negative.   Gastrointestinal: Negative.   Musculoskeletal: Positive for joint pain.  Psychiatric/Behavioral: Negative.   All other systems reviewed and are negative.    There is no immunization history on file for this patient. Objective:  BP 110/60 mmHg  Pulse 78  Temp(Src) 97.8 F (36.6 C) (Oral)  Resp 16  Wt 159 lb (72.122 kg)  Physical Exam  Constitutional: She is well-developed, well-nourished, and in no distress.  HENT:  Head: Normocephalic and atraumatic.  Right Ear: External ear normal.  Left Ear: External ear normal.  Nose: Nose normal.  Eyes: Conjunctivae and EOM are normal. Pupils are equal, round, and reactive to light.  Neck: Normal range of motion. Neck supple.    Cardiovascular: Normal rate, regular rhythm, normal heart sounds and intact distal pulses.   Pulmonary/Chest: Effort normal and breath sounds normal.  Skin: Skin is warm and dry.  Psychiatric: Mood, memory, affect and judgment normal.    Lab Results  Component Value Date   WBC 6.1 05/13/2013   HGB 12.8 05/13/2013   HCT 39.4 05/13/2013   PLT 215 05/13/2013   GLUCOSE 95 05/15/2013   CHOL 137 05/13/2013   TRIG 104 05/13/2013   HDL 43 05/13/2013   LDLCALC 73 05/13/2013   HGBA1C 8.5* 05/13/2013    CMP     Component Value Date/Time   NA 132* 05/15/2013 0512   NA 141 08/06/2009 0918   K 4.0 05/15/2013 0512   K 4.3 08/06/2009 0918   CL 99 05/15/2013 0512   CL 102 08/06/2009 0918   CO2 28 05/15/2013  0512   CO2 32 08/06/2009 0918   GLUCOSE 95 05/15/2013 0512   GLUCOSE 131* 08/06/2009 0918   BUN 80* 05/15/2013 0512   BUN 21 08/06/2009 0918   CREATININE 2.27* 05/15/2013 0512   CREATININE 0.96 08/06/2009 0918   CALCIUM 8.0* 05/15/2013 0512   CALCIUM 9.7 08/06/2009 0918   PROT 7.7 05/11/2013 2221   ALBUMIN 3.6 05/11/2013 2221   AST 46* 05/11/2013 2221   ALT 33 05/11/2013 2221   ALKPHOS 107 05/11/2013 2221   BILITOT 0.2 05/11/2013 2221   GFRNONAA 20* 05/15/2013 0512   GFRNONAA 57* 08/06/2009 0918   GFRAA 23* 05/15/2013 0512   GFRAA  08/06/2009 0918    >60        The eGFR has been calculated using the MDRD equation. This calculation has not been validated in all clinical situations. eGFR's persistently <60 mL/min signify possible Chronic Kidney Disease.    Assessment and Plan :  1. Essential hypertension, malignant   2. Type 2 diabetes mellitus with complication - POCT HgB L0B--8.6 - Insulin Lispro Prot & Lispro (HUMALOG MIX 75/25 KWIKPEN) (75-25) 100 UNIT/ML Kwikpen; Inject 60 Units into the skin 2 (two) times daily.  Dispense: 10 pen; Refill: 12  3. Gastroesophageal reflux disease, esophagitis presence not specified  - ranitidine (ZANTAC) 150 MG capsule; Take  1 capsule (150 mg total) by mouth 2 (two) times daily.  Dispense: 30 capsule; Refill: 12 1. Essential hypertension, malignant   2. Type 2 diabetes mellitus with complication  - POCT HgB A1C-9.6 today - Insulin Lispro Prot & Lispro (HUMALOG MIX 75/25 KWIKPEN) (75-25) 100 UNIT/ML Kwikpen; Inject 60 Units into the skin 2 (two) times daily.  Dispense: 10 pen; Refill: 12 Discussed goal A1c of less than 8.5.  3. Gastroesophageal reflux disease, esophagitis presence not specified  - ranitidine (ZANTAC) 150 MG capsule; Take 1 capsule (150 mg total) by mouth 2 (two) times daily.  Dispense: 30 capsule; Refill: 12 4 Osteoarthritis 5. Depression and anxiety Controlled, in remission Patient was seen and examined by Dr. Miguel Aschoff, and noted scribed by Webb Laws, [place] MD [facility] 12/23/2014 11:56 AM

## 2015-01-09 ENCOUNTER — Telehealth: Payer: Self-pay | Admitting: Family Medicine

## 2015-01-09 NOTE — Telephone Encounter (Signed)
Pharmacy called saying the zantac is not in stock for 150mg  capsuls.  Can they switch it to 150 mg tablets ?   Their call back is [phone]  Thanks Con Memos

## 2015-01-09 NOTE — Telephone Encounter (Signed)
Mandy at the pharmacy advised that it is ok-aa

## 2015-01-14 DIAGNOSIS — R0602 Shortness of breath: Secondary | ICD-10-CM | POA: Diagnosis not present

## 2015-02-05 ENCOUNTER — Ambulatory Visit (INDEPENDENT_AMBULATORY_CARE_PROVIDER_SITE_OTHER): Payer: Medicare Other

## 2015-02-05 DIAGNOSIS — B351 Tinea unguium: Secondary | ICD-10-CM | POA: Diagnosis not present

## 2015-02-05 DIAGNOSIS — L851 Acquired keratosis [keratoderma] palmaris et plantaris: Secondary | ICD-10-CM | POA: Diagnosis not present

## 2015-02-05 DIAGNOSIS — E119 Type 2 diabetes mellitus without complications: Secondary | ICD-10-CM | POA: Diagnosis not present

## 2015-02-05 DIAGNOSIS — Z23 Encounter for immunization: Secondary | ICD-10-CM | POA: Diagnosis not present

## 2015-02-11 DIAGNOSIS — H401132 Primary open-angle glaucoma, bilateral, moderate stage: Secondary | ICD-10-CM | POA: Diagnosis not present

## 2015-02-13 DIAGNOSIS — R0602 Shortness of breath: Secondary | ICD-10-CM | POA: Diagnosis not present

## 2015-02-27 ENCOUNTER — Ambulatory Visit (INDEPENDENT_AMBULATORY_CARE_PROVIDER_SITE_OTHER): Payer: Medicare Other | Admitting: Cardiovascular Disease

## 2015-02-27 ENCOUNTER — Encounter: Payer: Self-pay | Admitting: Cardiovascular Disease

## 2015-02-27 VITALS — BP 138/60 | HR 82 | Ht 63.0 in | Wt 158.8 lb

## 2015-02-27 DIAGNOSIS — I5032 Chronic diastolic (congestive) heart failure: Secondary | ICD-10-CM

## 2015-02-27 DIAGNOSIS — I10 Essential (primary) hypertension: Secondary | ICD-10-CM | POA: Diagnosis not present

## 2015-02-27 DIAGNOSIS — E785 Hyperlipidemia, unspecified: Secondary | ICD-10-CM | POA: Diagnosis not present

## 2015-02-27 NOTE — Assessment & Plan Note (Signed)
She is currently on simvastatin. I requested fasting lipid and liver profile. Target LDL is less than 100.

## 2015-02-27 NOTE — Progress Notes (Signed)
Primary care physician: Dr. [name]  HPI  This is a pleasant 81 year old African American female who is here today for a followup visit regarding chronic diastolic heart failure . She has known history of hypertension and diabetes. She presented to [facility] in January,2015  with acute onset of shortness of breath. She was noted to be hypoxic and hypertensive with initial systolic blood pressure of 220 mm mercury. She was in moderate respiratory distress. She was treated with IV Lasix with marked improvement. EKG showed nonspecific changes. Troponin was mildly elevated. She had an echocardiogram done which showed an ejection fraction of 55-60% with moderate left ventricular hypertrophy and grade 1 diastolic dysfunction.  Nuclear stress test in January, 2015  showed no evidence of ischemia with normal ejection fraction. Renal artery duplex ultrasound showed no evidence of renal artery stenosis.  She does have chronic kidney disease. She has been doing very well and denies worsening shortness of breath.  She reports significant arthritis in both knees which limits her activities. She walks slowly with a cane. She has rare episodes of indigestion  rest and not with physical activities.  Allergies  Allergen Reactions  . Codeine      Current Outpatient Prescriptions on File Prior to Visit  Medication Sig Dispense Refill  . acetaminophen (TYLENOL) 500 MG tablet Take 1,000 mg by mouth every 6 (six) hours as needed.    [name] aspirin 81 MG tablet Take 1 tablet (81 mg total) by mouth daily. 90 tablet 3  . B-D UF III MINI PEN NEEDLES 31G X 5 MM MISC USE TWICE A DAY AS DIRECTED 60 each 12  . Bepotastine Besilate (BEPREVE) 1.5 % SOLN Place 1 drop into both eyes daily.    . carvedilol (COREG) 12.5 MG tablet TAKE 1 TABLET BY MOUTH TWICE A DAY 180 tablet 3  . clonazePAM (KLONOPIN) 0.5 MG tablet Takes 1/2 tablet to 1 tablet as needed.    . furosemide (LASIX) 20 MG tablet Take 1 tablet (20 mg total) by mouth 2  (two) times daily. 180 tablet 3  . Insulin Lispro Prot & Lispro (HUMALOG MIX 75/25 KWIKPEN) (75-25) 100 UNIT/ML Kwikpen Inject 60 Units into the skin 2 (two) times daily. 10 pen 12  . loratadine (CLARITIN) 10 MG tablet Take 1 tablet (10 mg total) by mouth daily. 90 tablet 3  . quinapril (ACCUPRIL) 40 MG tablet Take 1 tablet (40 mg total) by mouth daily. 90 tablet 3  . ranitidine (ZANTAC) 150 MG capsule Take 1 capsule (150 mg total) by mouth 2 (two) times daily. 30 capsule 12  . simvastatin (ZOCOR) 40 MG tablet Take 40 mg by mouth daily.    . traZODone (DESYREL) 100 MG tablet Take 100 mg by mouth at bedtime.     No current facility-administered medications on file prior to visit.     Past Medical History  Diagnosis Date  . Diabetes mellitus without complication ([place])   . Hypertension   . Obesity   . Acute heart failure ([place])   . MI (myocardial infarction) ([place])   . Hyperlipidemia   . Chronic low back pain      Past Surgical History  Procedure Laterality Date  . Vaginal hysterectomy    . Knee surgery    . Eye surgery    . Thyroid surgery       Family History  Problem Relation Age of Onset  . Heart attack Mother      Social History   Social History  .  Marital Status: Widowed    Spouse Name: N/A  . Number of Children: N/A  . Years of Education: N/A   Occupational History  . Not on file.   Social History Main Topics  . Smoking status: Former Smoker    Types: Cigarettes  . Smokeless tobacco: Not on file  . Alcohol Use: No  . Drug Use: No  . Sexual Activity: Not on file   Other Topics Concern  . Not on file   Social History Narrative     PHYSICAL EXAM   BP 138/60 mmHg  Pulse 82  Ht 5\' 3"  (1.6 m)  Wt 158 lb 12 oz (72.009 kg)  BMI 28.13 kg/m2 Constitutional: She is oriented to person, place, and time. She appears well-developed and well-nourished. No distress.  HENT: No nasal discharge.  Head: Normocephalic and atraumatic.  Eyes: Pupils are equal and  round. No discharge.  Neck: Normal range of motion. Neck supple. No JVD present. No thyromegaly present.  Cardiovascular: Normal rate, regular rhythm, normal heart sounds. Exam reveals no gallop and no friction rub. There is one out of 6 systolic ejection murmur at the aortic area  Pulmonary/Chest: Effort normal and breath sounds normal. No stridor. No respiratory distress. She has no wheezes. She has no rales. She exhibits no tenderness.  Abdominal: Soft. Bowel sounds are normal. She exhibits no distension. There is no tenderness. There is no rebound and no guarding.  Musculoskeletal: Normal range of motion. She exhibits +1 edema and no tenderness.  Neurological: She is alert and oriented to person, place, and time. Coordination normal.  Skin: Skin is warm and dry. No rash noted. She is not diaphoretic. No erythema. No pallor.  Psychiatric: She has a normal mood and affect. Her behavior is normal. Judgment and thought content normal.     EKG: Normal sinus rhythm with lateral T wave changes.   ASSESSMENT AND PLAN

## 2015-02-27 NOTE — Assessment & Plan Note (Signed)
She appears to be euvolemic and stable on current medications. She hasn't had any recent labs done and given that she is on diuretics, I requested basic metabolic profile and CBC.

## 2015-02-27 NOTE — Assessment & Plan Note (Signed)
Blood pressure is now controlled on current medications.

## 2015-02-27 NOTE — Patient Instructions (Signed)
Medication Instructions:  Your physician recommends that you continue on your current medications as directed. Please refer to the Current Medication list given to you today.   Labwork: BMET, CBC, lipid, liver  Testing/Procedures: none  Follow-Up: Your physician wants you to follow-up in: six months with Dr. Fletcher Anon.  You will receive a reminder letter in the mail two months in advance. If you don't receive a letter, please call our office to schedule the follow-up appointment.   Any Other Special Instructions Will Be Listed Below (If Applicable).     If you need a refill on your cardiac medications before your next appointment, please call your pharmacy.

## 2015-02-28 LAB — CBC
HEMATOCRIT: 36.7 % (ref 34.0–46.6)
HEMOGLOBIN: 11.7 g/dL (ref 11.1–15.9)
MCH: 26.8 pg (ref 26.6–33.0)
MCHC: 31.9 g/dL (ref 31.5–35.7)
MCV: 84 fL (ref 79–97)
Platelets: 231 10*3/uL (ref 150–379)
RBC: 4.36 x10E6/uL (ref 3.77–5.28)
RDW: 14.3 % (ref 12.3–15.4)
WBC: 4.9 10*3/uL (ref 3.4–10.8)

## 2015-02-28 LAB — LIPID PANEL
CHOL/HDL RATIO: 2.9 ratio (ref 0.0–4.4)
Cholesterol, Total: 130 mg/dL (ref 100–199)
HDL: 45 mg/dL (ref >39)
LDL CALC: 63 mg/dL (ref 0–99)
TRIGLYCERIDES: 108 mg/dL (ref 0–149)
VLDL Cholesterol Cal: 22 mg/dL (ref 5–40)

## 2015-02-28 LAB — BASIC METABOLIC PANEL
BUN / CREAT RATIO: 19 (ref 11–26)
BUN: 26 mg/dL (ref 8–27)
CALCIUM: 8.9 mg/dL (ref 8.7–10.3)
CHLORIDE: 102 mmol/L (ref 97–106)
CO2: 25 mmol/L (ref 18–29)
CREATININE: 1.34 mg/dL — AB (ref 0.57–1.00)
GFR calc non Af Amer: 37 mL/min/{1.73_m2} — ABNORMAL LOW (ref >59)
GFR, EST AFRICAN AMERICAN: 43 mL/min/{1.73_m2} — AB (ref >59)
Glucose: 237 mg/dL — ABNORMAL HIGH (ref 65–99)
Potassium: 4.5 mmol/L (ref 3.5–5.2)
Sodium: 142 mmol/L (ref 136–144)

## 2015-02-28 LAB — HEPATIC FUNCTION PANEL
ALT: 13 IU/L (ref 0–32)
AST: 18 IU/L (ref 0–40)
Albumin: 3.9 g/dL (ref 3.5–4.7)
Alkaline Phosphatase: 94 IU/L (ref 39–117)
BILIRUBIN, DIRECT: 0.07 mg/dL (ref 0.00–0.40)
TOTAL PROTEIN: 5.7 g/dL — AB (ref 6.0–8.5)

## 2015-03-02 ENCOUNTER — Telehealth: Payer: Self-pay | Admitting: Family Medicine

## 2015-03-02 NOTE — Telephone Encounter (Signed)
Pt stated that she would like Lori Stanley to return her call. Pt stated that the mail order that handles her diabetic supplies called about her refill for a cream for knee pain relief. Pt stated that she was not sure what this was for and wanted to make sure that Dr. [name] was the provider that ordered the RX. Thanks TNP

## 2015-03-02 NOTE — Telephone Encounter (Signed)
Spoke to patient on Friday about this, spoke with Dr. [name] today and advised patient we did not order this, this is a scam and she needs to tell them she is not interested if they call again-aa

## 2015-03-06 ENCOUNTER — Other Ambulatory Visit: Payer: Self-pay

## 2015-03-06 MED ORDER — FUROSEMIDE 20 MG PO TABS: 20.0000 mg | ORAL_TABLET | Freq: Two times a day (BID) | ORAL | Status: DC

## 2015-03-06 NOTE — Telephone Encounter (Signed)
Lori Hampshire, MD at 02/27/2015 11:46 AM  furosemide (LASIX) 20 MG tablet Take 1 tablet (20 mg total) by mouth 2 (two) times daily         Patient Instructions     Medication Instructions:  Your physician recommends that you continue on your current medications as directed. Please refer to the Current Medication list given to you today.

## 2015-03-16 ENCOUNTER — Ambulatory Visit (INDEPENDENT_AMBULATORY_CARE_PROVIDER_SITE_OTHER): Payer: Medicare Other | Admitting: Ophthalmology

## 2015-03-16 DIAGNOSIS — H35033 Hypertensive retinopathy, bilateral: Secondary | ICD-10-CM

## 2015-03-16 DIAGNOSIS — E113592 Type 2 diabetes mellitus with proliferative diabetic retinopathy without macular edema, left eye: Secondary | ICD-10-CM

## 2015-03-16 DIAGNOSIS — E113511 Type 2 diabetes mellitus with proliferative diabetic retinopathy with macular edema, right eye: Secondary | ICD-10-CM

## 2015-03-16 DIAGNOSIS — I10 Essential (primary) hypertension: Secondary | ICD-10-CM | POA: Diagnosis not present

## 2015-03-16 DIAGNOSIS — E11311 Type 2 diabetes mellitus with unspecified diabetic retinopathy with macular edema: Secondary | ICD-10-CM

## 2015-03-16 DIAGNOSIS — R0602 Shortness of breath: Secondary | ICD-10-CM | POA: Diagnosis not present

## 2015-03-18 DIAGNOSIS — H401132 Primary open-angle glaucoma, bilateral, moderate stage: Secondary | ICD-10-CM | POA: Diagnosis not present

## 2015-03-25 ENCOUNTER — Telehealth: Payer: Self-pay | Admitting: Family Medicine

## 2015-03-25 ENCOUNTER — Other Ambulatory Visit: Payer: Self-pay

## 2015-03-25 MED ORDER — CLONAZEPAM 0.5 MG PO TABS: 0.5000 mg | ORAL_TABLET | Freq: Two times a day (BID) | ORAL | Status: DC | PRN

## 2015-03-25 NOTE — Telephone Encounter (Signed)
Patient is requesting refill on clonazePAM (KLONOPIN) 0.5 MG tablet   [facility]

## 2015-04-14 ENCOUNTER — Encounter: Payer: Self-pay | Admitting: Family Medicine

## 2015-04-14 ENCOUNTER — Ambulatory Visit (INDEPENDENT_AMBULATORY_CARE_PROVIDER_SITE_OTHER): Payer: Medicare Other | Admitting: Family Medicine

## 2015-04-14 VITALS — BP 178/82 | HR 84 | Temp 98.0°F | Resp 16

## 2015-04-14 DIAGNOSIS — E1121 Type 2 diabetes mellitus with diabetic nephropathy: Secondary | ICD-10-CM

## 2015-04-14 DIAGNOSIS — J309 Allergic rhinitis, unspecified: Secondary | ICD-10-CM | POA: Insufficient documentation

## 2015-04-14 DIAGNOSIS — E1169 Type 2 diabetes mellitus with other specified complication: Secondary | ICD-10-CM | POA: Insufficient documentation

## 2015-04-14 DIAGNOSIS — I10 Essential (primary) hypertension: Secondary | ICD-10-CM | POA: Diagnosis not present

## 2015-04-14 DIAGNOSIS — F329 Major depressive disorder, single episode, unspecified: Secondary | ICD-10-CM | POA: Insufficient documentation

## 2015-04-14 DIAGNOSIS — F419 Anxiety disorder, unspecified: Secondary | ICD-10-CM | POA: Insufficient documentation

## 2015-04-14 DIAGNOSIS — D649 Anemia, unspecified: Secondary | ICD-10-CM | POA: Insufficient documentation

## 2015-04-14 DIAGNOSIS — K219 Gastro-esophageal reflux disease without esophagitis: Secondary | ICD-10-CM | POA: Diagnosis not present

## 2015-04-14 DIAGNOSIS — E119 Type 2 diabetes mellitus without complications: Secondary | ICD-10-CM | POA: Insufficient documentation

## 2015-04-14 DIAGNOSIS — E785 Hyperlipidemia, unspecified: Secondary | ICD-10-CM | POA: Insufficient documentation

## 2015-04-14 DIAGNOSIS — M5431 Sciatica, right side: Secondary | ICD-10-CM | POA: Diagnosis not present

## 2015-04-14 DIAGNOSIS — D638 Anemia in other chronic diseases classified elsewhere: Secondary | ICD-10-CM | POA: Insufficient documentation

## 2015-04-14 DIAGNOSIS — M1990 Unspecified osteoarthritis, unspecified site: Secondary | ICD-10-CM | POA: Insufficient documentation

## 2015-04-14 DIAGNOSIS — F32A Depression, unspecified: Secondary | ICD-10-CM | POA: Insufficient documentation

## 2015-04-14 LAB — POCT GLYCOSYLATED HEMOGLOBIN (HGB A1C): Hemoglobin A1C: 8.1

## 2015-04-14 MED ORDER — PREDNISONE 5 MG (21) PO TBPK: 5.0000 mg | ORAL_TABLET | Freq: Every day | ORAL | Status: DC

## 2015-04-14 MED ORDER — FUROSEMIDE 20 MG PO TABS: 20.0000 mg | ORAL_TABLET | Freq: Two times a day (BID) | ORAL | Status: DC

## 2015-04-14 NOTE — Progress Notes (Signed)
Patient ID: Lori Stanley, female   DOB: 09-20-1931, 81 y.o.   MRN: [id number]    Subjective:  HPI  Diabetes follow up: Patient checks her sugar and readings vary 130-190, had a reading in 110s not too long ago. Lab Results  Component Value Date   HGBA1C 9.6 12/23/2014    GERD follow up: Patient is taking Ranitidine and states at times she may get indigestion but not severe.  She woke up this morning with right hip pain radiating to the right knee. She is not sure if she slept wrong maybe. She has not taking anything for pain. She does have chronic knee issues she states and they give out sometimes. She does use a cane to help her walk. Pain today in the hip lower back on the scale of 0 to 10 pain today is a 5. Prior to Admission medications   Medication Sig Start Date End Date Taking? Authorizing Provider  acetaminophen (TYLENOL) 500 MG tablet Take 1,000 mg by mouth every 6 (six) hours as needed.   Yes Historical Provider, MD  aspirin 81 MG tablet Take 1 tablet (81 mg total) by mouth daily. 12/15/14  Yes Richard Maceo Pro., MD  B-D UF III MINI PEN NEEDLES 31G X 5 MM MISC USE TWICE A DAY AS DIRECTED 12/05/14  Yes Jerrol Banana., MD  Bepotastine Besilate (BEPREVE) 1.5 % SOLN Place 1 drop into both eyes daily.   Yes Historical Provider, MD  carvedilol (COREG) 12.5 MG tablet TAKE 1 TABLET BY MOUTH TWICE A DAY 12/15/14  Yes Wellington Hampshire, MD  clonazePAM (KLONOPIN) 0.5 MG tablet Take 1 tablet (0.5 mg total) by mouth 2 (two) times daily as needed for anxiety. Takes 1/2 tablet to 1 tablet as needed. 03/25/15  Yes Richard Maceo Pro., MD  furosemide (LASIX) 20 MG tablet Take 1 tablet (20 mg total) by mouth 2 (two) times daily. 03/06/15  Yes Wellington Hampshire, MD  Insulin Lispro Prot & Lispro (HUMALOG MIX 75/25 KWIKPEN) (75-25) 100 UNIT/ML Kwikpen Inject 60 Units into the skin 2 (two) times daily. 12/23/14  Yes Richard Maceo Pro., MD  latanoprost (XALATAN) 0.005 % ophthalmic  solution Place 1 drop into the left eye at bedtime.  02/11/15  Yes Historical Provider, MD  loratadine (CLARITIN) 10 MG tablet Take 1 tablet (10 mg total) by mouth daily. 12/15/14  Yes Richard Maceo Pro., MD  quinapril (ACCUPRIL) 40 MG tablet Take 1 tablet (40 mg total) by mouth daily. 01/14/14  Yes Wellington Hampshire, MD  ranitidine (ZANTAC) 150 MG capsule Take 1 capsule (150 mg total) by mouth 2 (two) times daily. 12/23/14  Yes Richard Maceo Pro., MD  simvastatin (ZOCOR) 40 MG tablet Take 40 mg by mouth daily.   Yes Historical Provider, MD  traZODone (DESYREL) 100 MG tablet Take 100 mg by mouth at bedtime.   Yes Historical Provider, MD    Patient Active Problem List   Diagnosis Date Noted  . Hyperlipidemia 02/27/2015  . Glaucoma 12/23/2014  . Diabetes ([place]) 12/23/2014  . GERD (gastroesophageal reflux disease) 12/23/2014  . Essential hypertension, malignant 05/24/2013  . SOB (shortness of breath) 05/24/2013  . Chronic diastolic heart failure ([place]) 05/24/2013    Past Medical History  Diagnosis Date  . Diabetes mellitus without complication ([place])   . Hypertension   . Obesity   . Acute heart failure ([place])   . MI (myocardial infarction) ([place])   . Hyperlipidemia   . Chronic  low back pain     Social History   Social History  . Marital Status: Widowed    Spouse Name: N/A  . Number of Children: N/A  . Years of Education: N/A   Occupational History  . Not on file.   Social History Main Topics  . Smoking status: Former Smoker    Types: Cigarettes  . Smokeless tobacco: Never Used  . Alcohol Use: No  . Drug Use: No  . Sexual Activity: No   Other Topics Concern  . Not on file   Social History Narrative    Allergies  Allergen Reactions  . Antihistamines, Chlorpheniramine-Type   . Codeine     Review of Systems  Respiratory: Negative.   Cardiovascular: Negative.   Gastrointestinal: Positive for heartburn. Negative for nausea and vomiting.  Musculoskeletal: Positive  for joint pain.  Neurological: Positive for weakness. Negative for dizziness.  Endo/Heme/Allergies: Negative.   Psychiatric/Behavioral: Negative for depression. The patient is not nervous/anxious.     Immunization History  Administered Date(s) Administered  . Influenza, High Dose Seasonal PF 02/05/2015   Objective:  BP 178/82 mmHg  Pulse 84  Temp(Src) 98 F (36.7 C)  Resp 16  Physical Exam  Constitutional: She is oriented to person, place, and time and well-developed, well-nourished, and in no distress.  HENT:  Head: Normocephalic and atraumatic.  Right Ear: External ear normal.  Left Ear: External ear normal.  Nose: Nose normal.  Eyes: Conjunctivae are normal. Pupils are equal, round, and reactive to light.  Neck: Normal range of motion. Neck supple.  Cardiovascular: Normal rate, regular rhythm, normal heart sounds and intact distal pulses.   Pulmonary/Chest: Effort normal and breath sounds normal. No respiratory distress. She has no wheezes.  Abdominal: Soft.  Musculoskeletal: She exhibits no edema or tenderness.  No tenderness over LS spine, SI joints, sciatic notch, greater trochanters  Neurological: She is alert and oriented to person, place, and time. No cranial nerve deficit. She exhibits normal muscle tone. Gait normal. Coordination normal.  Straight leg raise is negative. She is able able to get up out of her wheelchair fairly easily and stands steadily on her own.  Skin: Skin is warm and dry.  Psychiatric: Mood, memory, affect and judgment normal.    Lab Results  Component Value Date   WBC 4.9 02/27/2015   HGB 12.8 05/13/2013   HCT 36.7 02/27/2015   PLT 215 05/13/2013   GLUCOSE 237* 02/27/2015   CHOL 130 02/27/2015   TRIG 108 02/27/2015   HDL 45 02/27/2015   LDLCALC 63 02/27/2015   HGBA1C 9.6 12/23/2014    CMP     Component Value Date/Time   NA 142 02/27/2015 1200   NA 132* 05/15/2013 0512   NA 141 08/06/2009 0918   K 4.5 02/27/2015 1200   K 4.0  05/15/2013 0512   CL 102 02/27/2015 1200   CL 99 05/15/2013 0512   CO2 25 02/27/2015 1200   CO2 28 05/15/2013 0512   GLUCOSE 237* 02/27/2015 1200   GLUCOSE 95 05/15/2013 0512   GLUCOSE 131* 08/06/2009 0918   BUN 26 02/27/2015 1200   BUN 80* 05/15/2013 0512   BUN 21 08/06/2009 0918   CREATININE 1.34* 02/27/2015 1200   CREATININE 2.27* 05/15/2013 0512   CALCIUM 8.9 02/27/2015 1200   CALCIUM 8.0* 05/15/2013 0512   PROT 5.7* 02/27/2015 1200   PROT 7.7 05/11/2013 2221   ALBUMIN 3.9 02/27/2015 1200   ALBUMIN 3.6 05/11/2013 2221   AST 18 02/27/2015 1200  AST 46* 05/11/2013 2221   ALT 13 02/27/2015 1200   ALT 33 05/11/2013 2221   ALKPHOS 94 02/27/2015 1200   ALKPHOS 107 05/11/2013 2221   BILITOT <0.2 02/27/2015 1200   BILITOT 0.2 05/11/2013 2221   GFRNONAA 37* 02/27/2015 1200   GFRNONAA 20* 05/15/2013 0512   GFRAA 43* 02/27/2015 1200   GFRAA 23* 05/15/2013 0512    Assessment and Plan :  1. Essential (primary) hypertension Elevated today but probably due to the pain. Will follow.  2. Gastro-esophageal reflux disease without esophagitis Stable.  3. Type 2 diabetes mellitus with diabetic nephropathy, without long-term current use of insulin (HCC) A1C 8.1 better. Continue current medication. - POCT HgB A1C  4. Sciatica of right side- May need further evaluation of that does not resolve fairly quickly. I expect it will take a few days. New. Will treat with Prednisone. If symptoms persist may need further work up. - predniSONE (STERAPRED UNI-PAK 21 TAB) 5 MG (21) TBPK tablet; Take 1 tablet (5 mg total) by mouth daily.  Dispense: 21 tablet; Refill: 0 5. Type 2 diabetes A1c today is 8.1 which is much better than the 9.6 that was in the summer. He is actually improving with the diabetes care. Try to keep the A1c below 8.5. 6. Hypertensive cardiomyopathy 7. Chronic diastolic heart failure Followed by Dr. Fletcher Anon from cardiology. Patient was seen and examined by Dr. Eulas Post  and note was scribed by Theressa Millard, RMA.   Miguel Aschoff MD [facility] 04/14/2015 11:53 AM

## 2015-04-15 DIAGNOSIS — R0602 Shortness of breath: Secondary | ICD-10-CM | POA: Diagnosis not present

## 2015-05-04 ENCOUNTER — Other Ambulatory Visit: Payer: Self-pay | Admitting: Family Medicine

## 2015-05-08 ENCOUNTER — Other Ambulatory Visit: Payer: Self-pay | Admitting: Family Medicine

## 2015-05-08 NOTE — Telephone Encounter (Signed)
Pt called saying the pharmacy told her today that they have not recd. the prescription for her prednisone.  She would like to pick it up today.  It looks like it went over on the 3rd but they told her they do not have it  [facility]  Thanks Con Memos

## 2015-05-16 DIAGNOSIS — J449 Chronic obstructive pulmonary disease, unspecified: Secondary | ICD-10-CM | POA: Diagnosis not present

## 2015-05-16 DIAGNOSIS — R0602 Shortness of breath: Secondary | ICD-10-CM | POA: Diagnosis not present

## 2015-05-18 ENCOUNTER — Telehealth: Payer: Self-pay | Admitting: Family Medicine

## 2015-05-18 NOTE — Telephone Encounter (Signed)
Please review-aa 

## 2015-05-18 NOTE — Telephone Encounter (Signed)
Decline Rx

## 2015-05-18 NOTE — Telephone Encounter (Signed)
Pt needs verbal order for Lidocaine Oilment 5% 744.024grams.  Call back [phone]  Please ok.  Thanks, Con Memos

## 2015-05-19 NOTE — Telephone Encounter (Signed)
Pharmacy Advised-aa

## 2015-05-22 DIAGNOSIS — L851 Acquired keratosis [keratoderma] palmaris et plantaris: Secondary | ICD-10-CM | POA: Diagnosis not present

## 2015-05-22 DIAGNOSIS — E119 Type 2 diabetes mellitus without complications: Secondary | ICD-10-CM | POA: Diagnosis not present

## 2015-05-22 DIAGNOSIS — B351 Tinea unguium: Secondary | ICD-10-CM | POA: Diagnosis not present

## 2015-05-27 ENCOUNTER — Other Ambulatory Visit: Payer: Self-pay | Admitting: Family Medicine

## 2015-06-16 DIAGNOSIS — R0602 Shortness of breath: Secondary | ICD-10-CM | POA: Diagnosis not present

## 2015-07-06 ENCOUNTER — Other Ambulatory Visit: Payer: Self-pay | Admitting: Family Medicine

## 2015-07-14 DIAGNOSIS — R0602 Shortness of breath: Secondary | ICD-10-CM | POA: Diagnosis not present

## 2015-07-15 ENCOUNTER — Ambulatory Visit: Payer: Medicare Other | Admitting: Family Medicine

## 2015-07-16 ENCOUNTER — Encounter: Payer: Self-pay | Admitting: Family Medicine

## 2015-07-16 ENCOUNTER — Ambulatory Visit (INDEPENDENT_AMBULATORY_CARE_PROVIDER_SITE_OTHER): Payer: Medicare Other | Admitting: Family Medicine

## 2015-07-16 VITALS — BP 142/80 | HR 82 | Temp 98.1°F | Resp 16 | Wt 163.0 lb

## 2015-07-16 DIAGNOSIS — I10 Essential (primary) hypertension: Secondary | ICD-10-CM

## 2015-07-16 DIAGNOSIS — M129 Arthropathy, unspecified: Secondary | ICD-10-CM | POA: Diagnosis not present

## 2015-07-16 DIAGNOSIS — K219 Gastro-esophageal reflux disease without esophagitis: Secondary | ICD-10-CM

## 2015-07-16 DIAGNOSIS — F32A Depression, unspecified: Secondary | ICD-10-CM

## 2015-07-16 DIAGNOSIS — M1710 Unilateral primary osteoarthritis, unspecified knee: Secondary | ICD-10-CM

## 2015-07-16 DIAGNOSIS — E1121 Type 2 diabetes mellitus with diabetic nephropathy: Secondary | ICD-10-CM | POA: Diagnosis not present

## 2015-07-16 DIAGNOSIS — F329 Major depressive disorder, single episode, unspecified: Secondary | ICD-10-CM | POA: Diagnosis not present

## 2015-07-16 LAB — POCT GLYCOSYLATED HEMOGLOBIN (HGB A1C): Hemoglobin A1C: 8.6

## 2015-07-16 MED ORDER — RANITIDINE HCL 150 MG PO TABS: 150.0000 mg | ORAL_TABLET | Freq: Two times a day (BID) | ORAL | Status: DC

## 2015-07-16 MED ORDER — MELOXICAM 7.5 MG PO TABS: 7.5000 mg | ORAL_TABLET | Freq: Every day | ORAL | Status: DC

## 2015-07-16 MED ORDER — SERTRALINE HCL 50 MG PO TABS: 50.0000 mg | ORAL_TABLET | Freq: Every day | ORAL | Status: DC

## 2015-07-16 NOTE — Progress Notes (Signed)
Patient ID: Lori Stanley, female   DOB: 01-Oct-1931, 82 y.o.   MRN: [id number]    Subjective:  HPI  Diabetes Mellitus Type II, Follow-up:   Lab Results  Component Value Date   HGBA1C 8.1 04/14/2015   HGBA1C 9.6 12/23/2014   HGBA1C 8.5* 05/13/2013    Last seen for diabetes 3 months ago.  Management since then includes none. She reports good compliance with treatment. She is having side effects.  Current symptoms include none Home blood sugar records: 114-135   Episodes of hypoglycemia? Some, but not often   Current Insulin Regimen: 60 units twice daily Most Recent Eye Exam:  Current exercise: none  Pertinent Labs:    Component Value Date/Time   CHOL 130 02/27/2015 1200   CHOL 137 05/13/2013 0344   TRIG 108 02/27/2015 1200   TRIG 104 05/13/2013 0344   HDL 45 02/27/2015 1200   HDL 43 05/13/2013 0344   LDLCALC 63 02/27/2015 1200   LDLCALC 73 05/13/2013 0344   CREATININE 1.34* 02/27/2015 1200   CREATININE 2.27* 05/15/2013 0512    Wt Readings from Last 3 Encounters:  07/16/15 163 lb (73.936 kg)  02/27/15 158 lb 12 oz (72.009 kg)  12/23/14 159 lb (72.122 kg)    ------------------------------------------------------------------------   Hypertension, follow-up:  BP Readings from Last 3 Encounters:  07/16/15 142/80  04/14/15 178/82  02/27/15 138/60    She was last seen for hypertension 3 months ago.  BP at that visit was 178/82. Management since that visit includes none. She reports good compliance with treatment. She is not having side effects.  She is not exercising. She is adherent to low salt diet.   Outside blood pressures are not being checked, but when nurse came out for insurance screening, it was a little elevated at 144/90. She is experiencing none.  Patient denies chest pain, chest pressure/discomfort, claudication, dyspnea, exertional chest pressure/discomfort, fatigue, irregular heart beat, lower extremity edema, near-syncope, orthopnea and  palpitations.    Wt Readings from Last 3 Encounters:  07/16/15 163 lb (73.936 kg)  02/27/15 158 lb 12 oz (72.009 kg)  12/23/14 159 lb (72.122 kg)   ------------------------------------------------------------------------ Pt has had a cough. She thinks it is related to her allergies. She takes loratadine daily. She also would like to know if she can have a "cream" for her knees because they have been hurting.     Prior to Admission medications   Medication Sig Start Date End Date Taking? Authorizing Provider  acetaminophen (TYLENOL) 500 MG tablet Take 1,000 mg by mouth every 6 (six) hours as needed.   Yes Historical Provider, MD  aspirin 81 MG tablet Take 1 tablet (81 mg total) by mouth daily. 12/15/14  Yes Richard Maceo Pro., MD  B-D UF III MINI PEN NEEDLES 31G X 5 MM MISC USE TWICE A DAY AS DIRECTED 12/05/14  Yes Jerrol Banana., MD  Bepotastine Besilate (BEPREVE) 1.5 % SOLN Place 1 drop into both eyes daily.   Yes Historical Provider, MD  carvedilol (COREG) 12.5 MG tablet TAKE 1 TABLET BY MOUTH TWICE A DAY 12/15/14  Yes Wellington Hampshire, MD  clonazePAM (KLONOPIN) 0.5 MG tablet Take 1 tablet (0.5 mg total) by mouth 2 (two) times daily as needed for anxiety. Takes 1/2 tablet to 1 tablet as needed. 03/25/15  Yes Richard Maceo Pro., MD  furosemide (LASIX) 20 MG tablet Take 1 tablet (20 mg total) by mouth 2 (two) times daily. 04/14/15  Yes Richard Maceo Pro.,  MD  Insulin Lispro Prot & Lispro (HUMALOG MIX 75/25 KWIKPEN) (75-25) 100 UNIT/ML Kwikpen Inject 60 Units into the skin 2 (two) times daily. 12/23/14  Yes Richard Maceo Pro., MD  latanoprost (XALATAN) 0.005 % ophthalmic solution Place 1 drop into the left eye at bedtime.  02/11/15  Yes Historical Provider, MD  loratadine (CLARITIN) 10 MG tablet Take 1 tablet (10 mg total) by mouth daily. 12/15/14  Yes Richard Maceo Pro., MD  quinapril (ACCUPRIL) 40 MG tablet Take 1 tablet (40 mg total) by mouth daily. 01/14/14  Yes Wellington Hampshire, MD  ranitidine (ZANTAC) 150 MG capsule Take 1 capsule (150 mg total) by mouth 2 (two) times daily. 12/23/14  Yes Richard Maceo Pro., MD  simvastatin (ZOCOR) 40 MG tablet Take 40 mg by mouth daily.   Yes Historical Provider, MD  traZODone (DESYREL) 150 MG tablet TAKE 1 TABLET BY MOUTH EVERY NIGHT AT BEDTIME 05/05/15  Yes Richard Maceo Pro., MD    Patient Active Problem List   Diagnosis Date Noted  . Absolute anemia 04/14/2015  . Anxiety 04/14/2015  . Clinical depression 04/14/2015  . Essential (primary) hypertension 04/14/2015  . Gastro-esophageal reflux disease without esophagitis 04/14/2015  . Arthritis, degenerative 04/14/2015  . Allergic rhinitis 04/14/2015  . Type 2 diabetes mellitus ([place]) 04/14/2015  . Hyperlipidemia 02/27/2015  . Glaucoma 12/23/2014  . Diabetes ([place]) 12/23/2014  . GERD (gastroesophageal reflux disease) 12/23/2014  . Essential hypertension, malignant 05/24/2013  . SOB (shortness of breath) 05/24/2013  . Chronic diastolic heart failure ([place]) 05/24/2013    Past Medical History  Diagnosis Date  . Diabetes mellitus without complication ([place])   . Hypertension   . Obesity   . Acute heart failure ([place])   . MI (myocardial infarction) ([place])   . Hyperlipidemia   . Chronic low back pain     Social History   Social History  . Marital Status: Widowed    Spouse Name: N/A  . Number of Children: N/A  . Years of Education: N/A   Occupational History  . Not on file.   Social History Main Topics  . Smoking status: Former Smoker    Types: Cigarettes  . Smokeless tobacco: Never Used  . Alcohol Use: No  . Drug Use: No  . Sexual Activity: No   Other Topics Concern  . Not on file   Social History Narrative    Allergies  Allergen Reactions  . Antihistamines, Chlorpheniramine-Type   . Codeine     Review of Systems  Constitutional: Negative.   HENT: Negative.   Eyes: Negative.   Respiratory: Positive for cough.   Cardiovascular: Negative.     Gastrointestinal: Negative.   Genitourinary: Negative.   Musculoskeletal: Positive for joint pain.  Skin: Negative.   Neurological: Negative.   Endo/Heme/Allergies: Negative.   Psychiatric/Behavioral: Negative.     Immunization History  Administered Date(s) Administered  . Influenza, High Dose Seasonal PF 02/05/2015   Objective:  BP 142/80 mmHg  Pulse 82  Temp(Src) 98.1 F (36.7 C) (Oral)  Resp 16  Wt 163 lb (73.936 kg)  SpO2 93%  Physical Exam  Constitutional: She is oriented to person, place, and time and well-developed, well-nourished, and in no distress.  HENT:  Head: Normocephalic and atraumatic.  Right Ear: External ear normal.  Left Ear: External ear normal.  Nose: Nose normal.  Eyes: Conjunctivae and EOM are normal. Pupils are equal, round, and reactive to light.  Neck: Normal range of motion. Neck supple.  Cardiovascular: Normal rate, regular rhythm, normal heart sounds and intact distal pulses.   Pulmonary/Chest: Effort normal and breath sounds normal.  Abdominal: Soft.  Musculoskeletal: She exhibits tenderness (medial joint line in bilateral knees).  Neurological: She is alert and oriented to person, place, and time. She has normal reflexes. Gait normal. GCS score is 15.  Skin: Skin is warm and dry.  Psychiatric: Mood, memory, affect and judgment normal.    Lab Results  Component Value Date   WBC 4.9 02/27/2015   HGB 12.8 05/13/2013   HCT 36.7 02/27/2015   PLT 231 02/27/2015   GLUCOSE 237* 02/27/2015   CHOL 130 02/27/2015   TRIG 108 02/27/2015   HDL 45 02/27/2015   LDLCALC 63 02/27/2015   TSH 0.931 05/13/2013   HGBA1C 8.1 04/14/2015    CMP     Component Value Date/Time   NA 142 02/27/2015 1200   NA 132* 05/15/2013 0512   NA 141 08/06/2009 0918   K 4.5 02/27/2015 1200   K 4.0 05/15/2013 0512   CL 102 02/27/2015 1200   CL 99 05/15/2013 0512   CO2 25 02/27/2015 1200   CO2 28 05/15/2013 0512   GLUCOSE 237* 02/27/2015 1200   GLUCOSE 95  05/15/2013 0512   GLUCOSE 131* 08/06/2009 0918   BUN 26 02/27/2015 1200   BUN 80* 05/15/2013 0512   BUN 21 08/06/2009 0918   CREATININE 1.34* 02/27/2015 1200   CREATININE 2.27* 05/15/2013 0512   CALCIUM 8.9 02/27/2015 1200   CALCIUM 8.0* 05/15/2013 0512   PROT 5.7* 02/27/2015 1200   PROT 7.7 05/11/2013 2221   ALBUMIN 3.9 02/27/2015 1200   ALBUMIN 3.6 05/11/2013 2221   AST 18 02/27/2015 1200   AST 46* 05/11/2013 2221   ALT 13 02/27/2015 1200   ALT 33 05/11/2013 2221   ALKPHOS 94 02/27/2015 1200   ALKPHOS 107 05/11/2013 2221   BILITOT <0.2 02/27/2015 1200   BILITOT 0.2 05/11/2013 2221   GFRNONAA 37* 02/27/2015 1200   GFRNONAA 20* 05/15/2013 0512   GFRAA 43* 02/27/2015 1200   GFRAA 23* 05/15/2013 0512    Assessment and Plan :  1. Essential (primary) hypertension   2. Gastro-esophageal reflux disease without esophagitis  - ranitidine (ZANTAC) 150 MG tablet; Take 1 tablet (150 mg total) by mouth 2 (two) times daily.  Dispense: 60 tablet; Refill: 12  3. Type 2 diabetes mellitus with diabetic nephropathy, without long-term current use of insulin (HCC)  more than 50% of today's visit is spent in counseling regarding her issues. - POCT HgB A1C-8.6 today. Worse but will continue medications   4. Depression Follow up 1 month. Lifelong antidepressive therapy necessary. - sertraline (ZOLOFT) 50 MG tablet; Take 1 tablet (50 mg total) by mouth daily.  Dispense: 30 tablet; Refill: 12  5. Arthritis of knee If does not improve with Meloxicam will send to Ortho. Follow up in 1 month.  - meloxicam (MOBIC) 7.5 MG tablet; Take 1 tablet (7.5 mg total) by mouth daily.  Dispense: 30 tablet; Refill: 0  Patient was seen and examined by Dr. Miguel Aschoff, and noted scribed by Webb Laws, [place] MD [facility] 07/16/2015 11:09 AM

## 2015-07-16 NOTE — Patient Instructions (Signed)
Try Robitussin over the counter for the mucus in the throat.

## 2015-07-22 ENCOUNTER — Telehealth: Payer: Self-pay

## 2015-07-22 DIAGNOSIS — H401132 Primary open-angle glaucoma, bilateral, moderate stage: Secondary | ICD-10-CM | POA: Diagnosis not present

## 2015-07-22 MED ORDER — FUROSEMIDE 20 MG PO TABS: 20.0000 mg | ORAL_TABLET | Freq: Two times a day (BID) | ORAL | Status: DC

## 2015-07-22 NOTE — Telephone Encounter (Signed)
ok 

## 2015-07-22 NOTE — Telephone Encounter (Signed)
Refilled medication for 90 days as listed below.

## 2015-07-22 NOTE — Telephone Encounter (Signed)
Refill request for lasix 20 mg 1 po BID for 90 day supply. Please review-aa

## 2015-08-14 DIAGNOSIS — R0602 Shortness of breath: Secondary | ICD-10-CM | POA: Diagnosis not present

## 2015-08-21 DIAGNOSIS — B351 Tinea unguium: Secondary | ICD-10-CM | POA: Diagnosis not present

## 2015-08-21 DIAGNOSIS — E119 Type 2 diabetes mellitus without complications: Secondary | ICD-10-CM | POA: Diagnosis not present

## 2015-09-13 DIAGNOSIS — R0602 Shortness of breath: Secondary | ICD-10-CM | POA: Diagnosis not present

## 2015-09-14 ENCOUNTER — Encounter (INDEPENDENT_AMBULATORY_CARE_PROVIDER_SITE_OTHER): Payer: Medicare Other | Admitting: Ophthalmology

## 2015-09-14 DIAGNOSIS — I10 Essential (primary) hypertension: Secondary | ICD-10-CM | POA: Diagnosis not present

## 2015-09-14 DIAGNOSIS — H34212 Partial retinal artery occlusion, left eye: Secondary | ICD-10-CM

## 2015-09-14 DIAGNOSIS — E113593 Type 2 diabetes mellitus with proliferative diabetic retinopathy without macular edema, bilateral: Secondary | ICD-10-CM

## 2015-09-14 DIAGNOSIS — H35033 Hypertensive retinopathy, bilateral: Secondary | ICD-10-CM | POA: Diagnosis not present

## 2015-09-14 DIAGNOSIS — E11319 Type 2 diabetes mellitus with unspecified diabetic retinopathy without macular edema: Secondary | ICD-10-CM

## 2015-09-15 ENCOUNTER — Other Ambulatory Visit: Payer: Self-pay | Admitting: Family Medicine

## 2015-09-21 ENCOUNTER — Other Ambulatory Visit: Payer: Self-pay | Admitting: Family Medicine

## 2015-09-21 NOTE — Telephone Encounter (Signed)
Please call in clonazepam  

## 2015-10-01 ENCOUNTER — Ambulatory Visit (INDEPENDENT_AMBULATORY_CARE_PROVIDER_SITE_OTHER): Payer: Medicare Other | Admitting: Family Medicine

## 2015-10-01 VITALS — BP 152/80 | HR 80 | Temp 98.0°F | Resp 16

## 2015-10-01 DIAGNOSIS — E1121 Type 2 diabetes mellitus with diabetic nephropathy: Secondary | ICD-10-CM | POA: Diagnosis not present

## 2015-10-01 DIAGNOSIS — M129 Arthropathy, unspecified: Secondary | ICD-10-CM

## 2015-10-01 DIAGNOSIS — M1710 Unilateral primary osteoarthritis, unspecified knee: Secondary | ICD-10-CM

## 2015-10-01 DIAGNOSIS — F329 Major depressive disorder, single episode, unspecified: Secondary | ICD-10-CM

## 2015-10-01 DIAGNOSIS — F32A Depression, unspecified: Secondary | ICD-10-CM

## 2015-10-01 DIAGNOSIS — I10 Essential (primary) hypertension: Secondary | ICD-10-CM

## 2015-10-01 NOTE — Progress Notes (Signed)
Patient ID: Lori Stanley, female   DOB: 06-19-1931, 82 y.o.   MRN: [id number]   Lori Stanley  MRN: [id number] DOB: 1932-04-22  Subjective:  HPI  1. Depression The patient is an 82 year old female who presents for follow up of her depression.  She was last seen on 07/16/15 and was started on Sertraline.  The patient states that she started the medicine but did not feel well on it and stopped taking it.  She could not say exactly how she felt on it but just said she didn't feel good when taking it.  When asked how she thought she was doing as far as her depression, she stated she thought she was good.  2. Arthritis of knee She is also here to follow up on her arthritis in her knees.  She was started on Meloxicam after being seen on 07/16/15.  She said that she thinks it did help the pain some.  When asked if she was able to walk better while taking it she states 'I couldn't really say".  The patient states if she could get some salve she believes it would feel better.   Patient Active Problem List   Diagnosis Date Noted  . Absolute anemia 04/14/2015  . Anxiety 04/14/2015  . Clinical depression 04/14/2015  . Essential (primary) hypertension 04/14/2015  . Gastro-esophageal reflux disease without esophagitis 04/14/2015  . Arthritis, degenerative 04/14/2015  . Allergic rhinitis 04/14/2015  . Type 2 diabetes mellitus ([place]) 04/14/2015  . Hyperlipidemia 02/27/2015  . Glaucoma 12/23/2014  . Diabetes ([place]) 12/23/2014  . GERD (gastroesophageal reflux disease) 12/23/2014  . Essential hypertension, malignant 05/24/2013  . SOB (shortness of breath) 05/24/2013  . Chronic diastolic heart failure ([place]) 05/24/2013    Past Medical History  Diagnosis Date  . Diabetes mellitus without complication ([employer])   . Hypertension   . Obesity   . Acute heart failure ([place])   . MI (myocardial infarction) ([place])   . Hyperlipidemia   . Chronic low back pain     Social History   Social History  .  Marital Status: Widowed    Spouse Name: N/A  . Number of Children: N/A  . Years of Education: N/A   Occupational History  . Not on file.   Social History Main Topics  . Smoking status: Former Smoker    Types: Cigarettes  . Smokeless tobacco: Never Used  . Alcohol Use: No  . Drug Use: No  . Sexual Activity: No   Other Topics Concern  . Not on file   Social History Narrative    Outpatient Prescriptions Prior to Visit  Medication Sig Dispense Refill  . acetaminophen (TYLENOL) 500 MG tablet Take 1,000 mg by mouth every 6 (six) hours as needed.    [name] aspirin 81 MG tablet Take 1 tablet (81 mg total) by mouth daily. 90 tablet 3  . B-D UF III MINI PEN NEEDLES 31G X 5 MM MISC USE TWICE A DAY AS DIRECTED 60 each 12  . Bepotastine Besilate (BEPREVE) 1.5 % SOLN Place 1 drop into both eyes daily.    . carvedilol (COREG) 12.5 MG tablet TAKE 1 TABLET BY MOUTH TWICE A DAY 180 tablet 3  . clonazePAM (KLONOPIN) 0.5 MG tablet TAKE 1 TABLET BY MOUTH TWICE A DAY AS NEEDED FOR ANXIETY . TAKES 1/2 TO 1 TABLET AS NEEDED 30 tablet 1  . furosemide (LASIX) 20 MG tablet Take 1 tablet (20 mg total) by mouth 2 (two) times daily. [place]  tablet 0  . Insulin Lispro Prot & Lispro (HUMALOG MIX 75/25 KWIKPEN) (75-25) 100 UNIT/ML Kwikpen Inject 60 Units into the skin 2 (two) times daily. 10 pen 12  . latanoprost (XALATAN) 0.005 % ophthalmic solution Place 1 drop into the left eye at bedtime.     [name] loratadine (CLARITIN) 10 MG tablet Take 1 tablet (10 mg total) by mouth daily. 90 tablet 3  . quinapril (ACCUPRIL) 40 MG tablet Take 1 tablet (40 mg total) by mouth daily. 90 tablet 3  . ranitidine (ZANTAC) 150 MG capsule Take 1 capsule (150 mg total) by mouth 2 (two) times daily. 30 capsule 12  . simvastatin (ZOCOR) 40 MG tablet Take 40 mg by mouth daily.    . traZODone (DESYREL) 150 MG tablet TAKE 1 TABLET BY MOUTH EVERY NIGHT AT BEDTIME 90 tablet 3  . meloxicam (MOBIC) 7.5 MG tablet Take 1 tablet (7.5 mg total) by mouth  daily. (Patient not taking: Reported on 10/01/2015) 30 tablet 0  . sertraline (ZOLOFT) 50 MG tablet Take 1 tablet (50 mg total) by mouth daily. (Patient not taking: Reported on 10/01/2015) 30 tablet 12  . ranitidine (ZANTAC) 150 MG tablet Take 1 tablet (150 mg total) by mouth 2 (two) times daily. 60 tablet 12   No facility-administered medications prior to visit.    Allergies  Allergen Reactions  . Antihistamines, Chlorpheniramine-Type   . Codeine     Review of Systems  Constitutional: Negative for fever and malaise/fatigue.  Respiratory: Positive for cough. Negative for shortness of breath and wheezing.   Cardiovascular: Negative for chest pain, palpitations, orthopnea, claudication, leg swelling and PND.  Musculoskeletal: Positive for joint pain (knees). Negative for myalgias, back pain, falls and neck pain.       Finger cramping  Neurological: Negative for dizziness, weakness and headaches.   Objective:  BP 152/80 mmHg  Pulse 80  Temp(Src) 98 F (36.7 C) (Oral)  Resp 16  Physical Exam  Constitutional: She is oriented to person, place, and time and well-developed, well-nourished, and in no distress.  HENT:  Head: Normocephalic and atraumatic.  Eyes: Pupils are equal, round, and reactive to light.  Neck: Normal range of motion.  Cardiovascular: Normal rate, regular rhythm and normal heart sounds.   Pulmonary/Chest: Effort normal and breath sounds normal.  Musculoskeletal: She exhibits edema (1+ bilaterally).  Neurological: She is alert and oriented to person, place, and time.  Skin: Skin is warm and dry.  Psychiatric: Mood, memory, affect and judgment normal.    Assessment and Plan :   1. Depression Appears stable today.  Will follow  2. Arthritis of knee Have recommended Blue Emu for the patient.  She is also going to talk with pharmacist about any other type of OTC topical she can use for her arhritis.  3. Essential (primary) hypertension Will recheck on next visit.   If still elevated we may need to increase her Carvedilol.  4. Type 2 diabetes mellitus with diabetic nephropathy, without long-term current use of insulin ([place]) Will follow up in 2 months with A1C.  Patient was examined, and note was reviewed by Dr. Miguel Aschoff after note was scribed by Althea Charon, [place] MD [facility] 10/01/2015 11:24 AM

## 2015-10-01 NOTE — Patient Instructions (Addendum)
Patient may try Blue Emu topical for her arthritis knees.  Ask the pharmacy about any other topical cream other than [facility] or [facility] for her arthritic pain.

## 2015-10-04 ENCOUNTER — Other Ambulatory Visit: Payer: Self-pay | Admitting: Family Medicine

## 2015-10-14 DIAGNOSIS — R0602 Shortness of breath: Secondary | ICD-10-CM | POA: Diagnosis not present

## 2015-11-13 DIAGNOSIS — R0602 Shortness of breath: Secondary | ICD-10-CM | POA: Diagnosis not present

## 2015-11-14 ENCOUNTER — Other Ambulatory Visit: Payer: Self-pay | Admitting: Family Medicine

## 2015-11-16 ENCOUNTER — Other Ambulatory Visit: Payer: Self-pay

## 2015-11-16 MED ORDER — SIMVASTATIN 40 MG PO TABS: 40.0000 mg | ORAL_TABLET | Freq: Every day | ORAL | Status: DC

## 2015-11-20 DIAGNOSIS — B351 Tinea unguium: Secondary | ICD-10-CM | POA: Diagnosis not present

## 2015-11-20 DIAGNOSIS — E119 Type 2 diabetes mellitus without complications: Secondary | ICD-10-CM | POA: Diagnosis not present

## 2015-11-25 DIAGNOSIS — H3581 Retinal edema: Secondary | ICD-10-CM | POA: Diagnosis not present

## 2015-11-25 DIAGNOSIS — H401132 Primary open-angle glaucoma, bilateral, moderate stage: Secondary | ICD-10-CM | POA: Diagnosis not present

## 2015-12-05 ENCOUNTER — Other Ambulatory Visit: Payer: Self-pay | Admitting: Family Medicine

## 2015-12-05 DIAGNOSIS — E118 Type 2 diabetes mellitus with unspecified complications: Secondary | ICD-10-CM

## 2015-12-08 ENCOUNTER — Other Ambulatory Visit: Payer: Self-pay | Admitting: Emergency Medicine

## 2015-12-08 DIAGNOSIS — E118 Type 2 diabetes mellitus with unspecified complications: Secondary | ICD-10-CM

## 2015-12-08 DIAGNOSIS — Z794 Long term (current) use of insulin: Principal | ICD-10-CM

## 2015-12-08 MED ORDER — INSULIN LISPRO PROT & LISPRO (75-25 MIX) 100 UNIT/ML KWIKPEN: 60.0000 [IU] | PEN_INJECTOR | Freq: Two times a day (BID) | SUBCUTANEOUS | 12 refills | Status: DC

## 2015-12-08 NOTE — Progress Notes (Signed)
Received notification that rx was sent in wrong (quantity for dosing) and pharmacy faxed over request for correction. Medication sent.

## 2015-12-09 ENCOUNTER — Ambulatory Visit: Payer: Medicare Other | Admitting: Family Medicine

## 2015-12-09 NOTE — Progress Notes (Deleted)
Patient: Lori Stanley Female    DOB: Jan 29, 1932   82 y.o.   MRN: [id number] Visit Date: 12/09/2015  Today's Provider: [name], MD   No chief complaint on file.  Subjective:    HPI   Diabetes Mellitus Type II, Follow-up:   Lab Results  Component Value Date   HGBA1C 8.6 07/16/2015   HGBA1C 8.1 04/14/2015   HGBA1C 9.6 12/23/2014    Last seen for diabetes 2 months ago.  Management since then includes none; was going to check A1C at 2 month FU. She reports {excellent/good/fair/poor:19665} compliance with treatment. She {ACTION; IS/IS [id number] having side effects. *** Current symptoms include {Symptoms; diabetes:14075} and have been {Desc; course:15616}. Home blood sugar records: {diabetes glucometry results:16657}  Episodes of hypoglycemia? {yes***/no:17258}   Current Insulin Regimen: *** Most Recent Eye Exam: *** Weight trend: {trend:16658} Prior visit with dietician: {yes/no:17258} Current diet: {diet habits:16563} Current exercise: {exercise types:16438}  Pertinent Labs:    Component Value Date/Time   CHOL 130 02/27/2015 1200   CHOL 137 05/13/2013 0344   TRIG 108 02/27/2015 1200   TRIG 104 05/13/2013 0344   HDL 45 02/27/2015 1200   HDL 43 05/13/2013 0344   LDLCALC 63 02/27/2015 1200   LDLCALC 73 05/13/2013 0344   CREATININE 1.34 (H) 02/27/2015 1200   CREATININE 2.27 (H) 05/15/2013 0512    Wt Readings from Last 3 Encounters:  07/16/15 163 lb (73.9 kg)  02/27/15 158 lb 12 oz (72 kg)  12/23/14 159 lb (72.1 kg)    ------------------------------------------------------------------------   Hypertension, follow-up:  BP Readings from Last 3 Encounters:  10/01/15 (!) 152/80  07/16/15 (!) 142/80  04/14/15 (!) 178/82    She was last seen for hypertension {NUMBERS 1-12:18279} {days/wks/mos/yrs:310907} ago.  BP at that visit was ***. Management since that visit includes ***. She reports {excellent/good/fair/poor:19665} compliance  with treatment. She {ACTION; IS/IS [id number] having side effects. *** She {is/is not:9024} exercising. She {is/is not:9024} adherent to low salt diet.   Outside blood pressures are ***. She is experiencing {Symptoms; cardiac:12860}.  Patient denies {Symptoms; cardiac:12860}.   Cardiovascular risk factors include {cv risk factors:510}.  Use of agents associated with hypertension: {bp agents assoc with hypertension:511::"none"}.     Weight trend: {trend:16658} Wt Readings from Last 3 Encounters:  07/16/15 163 lb (73.9 kg)  02/27/15 158 lb 12 oz (72 kg)  12/23/14 159 lb (72.1 kg)    Current diet: {diet habits:16563}  ------------------------------------------------------------------------   Follow up for Depression  The patient was last seen for this 2 months ago. Changes made at last visit include none. At [place], pt was concerned because Sertraline was causing pt to not feel well. PCP wanted to continue monitoring for depression.  She reports {excellent/good/fair/poor:19665} compliance with treatment. She feels that condition is {improved/worse/unchanged:3041574}. She {ACTION; IS/IS [id number] having side effects. ***  ------------------------------------------------------------------------------------        Allergies  Allergen Reactions  . Antihistamines, Chlorpheniramine-Type   . Codeine    No outpatient prescriptions have been marked as taking for the 12/09/15 encounter (Appointment) with Jerrol Banana., MD.    Review of Systems  Social History  Substance Use Topics  . Smoking status: Former Smoker    Types: Cigarettes  . Smokeless tobacco: Never Used  . Alcohol use No   Objective:   There were no vitals taken for this visit.  Physical Exam      Assessment & Plan:  Richard Cranford Mon, MD  [facility]

## 2015-12-14 DIAGNOSIS — R0602 Shortness of breath: Secondary | ICD-10-CM | POA: Diagnosis not present

## 2015-12-15 ENCOUNTER — Ambulatory Visit (INDEPENDENT_AMBULATORY_CARE_PROVIDER_SITE_OTHER): Payer: Medicare Other | Admitting: Family Medicine

## 2015-12-15 ENCOUNTER — Encounter: Payer: Self-pay | Admitting: Family Medicine

## 2015-12-15 VITALS — BP 128/64 | HR 88 | Temp 98.3°F | Resp 16 | Wt 162.0 lb

## 2015-12-15 DIAGNOSIS — I10 Essential (primary) hypertension: Secondary | ICD-10-CM | POA: Diagnosis not present

## 2015-12-15 DIAGNOSIS — E1121 Type 2 diabetes mellitus with diabetic nephropathy: Secondary | ICD-10-CM | POA: Diagnosis not present

## 2015-12-15 DIAGNOSIS — R55 Syncope and collapse: Secondary | ICD-10-CM | POA: Diagnosis not present

## 2015-12-15 DIAGNOSIS — E785 Hyperlipidemia, unspecified: Secondary | ICD-10-CM | POA: Diagnosis not present

## 2015-12-15 LAB — POCT GLYCOSYLATED HEMOGLOBIN (HGB A1C)
ESTIMATED AVERAGE GLUCOSE: 226
HEMOGLOBIN A1C: 9.5

## 2015-12-15 NOTE — Progress Notes (Signed)
Patient: Lori Stanley Female    DOB: 07-27-1931   82 y.o.   MRN: [id number] Visit Date: 12/15/2015  Today's Provider: [name], MD   Chief Complaint  Patient presents with  . Hypertension  . Diabetes   Subjective:    HPI    Hypertension, follow-up:  BP Readings from Last 3 Encounters:  12/15/15 128/64  10/01/15 (!) 152/80  07/16/15 (!) 142/80    She was last seen for hypertension 2 months ago.  BP at that visit was 152/80. If BP is still elevated at FU, PCP was going to consider increasing carvedilol. She reports good compliance with treatment. She is not having side effects. She is not exercising. She is adherent to low salt diet.   She is experiencing fatigue, lower extremity edema, near-syncope and syncope (last week). Patient denies chest pain, chest pressure/discomfort, exertional chest pressure/discomfort, irregular heart beat, orthopnea and palpitations.   Cardiovascular risk factors include advanced age (older than 54 for men, 74 for women), diabetes mellitus, dyslipidemia, hypertension and sedentary lifestyle.   Weight trend: stable Wt Readings from Last 3 Encounters:  12/15/15 162 lb (73.5 kg)  07/16/15 163 lb (73.9 kg)  02/27/15 158 lb 12 oz (72 kg)    Current diet: in general, a "healthy" diet    ------------------------------------------------------------------------        Diabetes Mellitus Type II, Follow-up:   Lab Results  Component Value Date   HGBA1C 9.5 12/15/2015   HGBA1C 8.6 07/16/2015   HGBA1C 8.1 04/14/2015    Last seen for diabetes 5 months ago.  Management since then includes continuing medications. She reports good compliance with treatment. She is not having side effects. Current symptoms include hypoglycemia  and have been unchanged. Home blood sugar records: below 200  Episodes of hypoglycemia? Yes   Current Insulin Regimen: 60 IU BID Most Recent Eye Exam: last week Weight trend:  stable   Pertinent Labs:    Component Value Date/Time   CHOL 130 02/27/2015 1200   CHOL 137 05/13/2013 0344   TRIG 108 02/27/2015 1200   TRIG 104 05/13/2013 0344   HDL 45 02/27/2015 1200   HDL 43 05/13/2013 0344   LDLCALC 63 02/27/2015 1200   LDLCALC 73 05/13/2013 0344   CREATININE 1.34 (H) 02/27/2015 1200   CREATININE 2.27 (H) 05/15/2013 0512    Wt Readings from Last 3 Encounters:  12/15/15 162 lb (73.5 kg)  07/16/15 163 lb (73.9 kg)  02/27/15 158 lb 12 oz (72 kg)    ------------------------------------------------------------------------   Allergies  Allergen Reactions  . Antihistamines, Chlorpheniramine-Type   . Codeine    Current Meds  Medication Sig  . acetaminophen (TYLENOL) 500 MG tablet Take 1,000 mg by mouth every 6 (six) hours as needed.  [name] aspirin 81 MG tablet Take 1 tablet (81 mg total) by mouth daily.  . B-D UF III MINI PEN NEEDLES 31G X 5 MM MISC USE TWICE A DAY AS DIRECTED  . Bepotastine Besilate (BEPREVE) 1.5 % SOLN Place 1 drop into both eyes daily.  . brimonidine (ALPHAGAN) 0.2 % ophthalmic solution 3 (three) times daily.  . carvedilol (COREG) 12.5 MG tablet TAKE 1 TABLET BY MOUTH TWICE A DAY  . clonazePAM (KLONOPIN) 0.5 MG tablet TAKE 1 TABLET BY MOUTH TWICE A DAY AS NEEDED FOR ANXIETY . TAKES 1/2 TO 1 TABLET AS NEEDED  . furosemide (LASIX) 20 MG tablet TAKE 1 TABLET (20 MG TOTAL) BY MOUTH 2 (TWO) TIMES DAILY.  [name]  Insulin Lispro Prot & Lispro (HUMALOG MIX 75/25 KWIKPEN) (75-25) 100 UNIT/ML Kwikpen Inject 60 Units into the skin 2 (two) times daily.  [name] latanoprost (XALATAN) 0.005 % ophthalmic solution Place 1 drop into the left eye at bedtime.   [name] loratadine (CLARITIN) 10 MG tablet Take 1 tablet (10 mg total) by mouth daily.  . quinapril (ACCUPRIL) 40 MG tablet TAKE 1 TABLET BY MOUTH EVERY DAY  . ranitidine (ZANTAC) 150 MG capsule Take 1 capsule (150 mg total) by mouth 2 (two) times daily.  . simvastatin (ZOCOR) 40 MG tablet Take 1 tablet (40 mg total)  by mouth daily.  . traZODone (DESYREL) 150 MG tablet TAKE 1 TABLET BY MOUTH EVERY NIGHT AT BEDTIME    Review of Systems  Constitutional: Positive for fatigue. Negative for activity change, appetite change, chills, diaphoresis, fever and unexpected weight change.  Eyes: Negative.   Respiratory: Negative.  Negative for shortness of breath.   Cardiovascular: Positive for leg swelling. Negative for chest pain and palpitations.  Endocrine: Negative.   Allergic/Immunologic: Negative.   Neurological: Negative.   Hematological: Negative.   Psychiatric/Behavioral: Negative.     Social History  Substance Use Topics  . Smoking status: Former Smoker    Types: Cigarettes  . Smokeless tobacco: Never Used  . Alcohol use No   Objective:   BP 128/64 (BP Location: Right Arm, Patient Position: Sitting, Cuff Size: Normal)   Pulse 88   Temp 98.3 F (36.8 C) (Oral)   Resp 16   Wt 162 lb (73.5 kg)   SpO2 94%   BMI 28.70 kg/m   Physical Exam  Constitutional: She is oriented to person, place, and time. She appears well-developed and well-nourished.  HENT:  Head: Normocephalic and atraumatic.  Right Ear: External ear normal.  Left Ear: External ear normal.  Nose: Nose normal.  Eyes: Conjunctivae are normal.  Neck: Neck supple. No thyromegaly present.  Cardiovascular: Normal rate, regular rhythm and normal heart sounds.   No carotid bruit  Pulmonary/Chest: Effort normal and breath sounds normal. No respiratory distress.  Abdominal: Soft.  Musculoskeletal: She exhibits edema (1 + edema).  Lymphadenopathy:    She has no cervical adenopathy.  Neurological: She is alert and oriented to person, place, and time. No cranial nerve deficit. She exhibits normal muscle tone. Coordination normal.  Skin: Skin is warm and dry.  Psychiatric: She has a normal mood and affect. Her behavior is normal. Judgment and thought content normal.        Assessment & Plan:     1. Essential hypertension,  malignant    2. Type 2 diabetes mellitus with diabetic nephropathy, without long-term current use of insulin (HCC) Diabetes is a  little labile. A1c of 9.5 is a little bit worse and 8.6. Would like to try to keep her below 9 at her age of  82 years old. Again, she has limited resources and very little help at home so hypoglycemia is not in her best interest. May have to let the sugars drift up just a little bit. More than 50% of this visit is spent in counseling regarding these issues and her overall care of herself - POCT glycosylated hemoglobin (Hb A1C)--9.5 today. - CBC with Differential/Platelet - Comprehensive metabolic panel Results for orders placed or performed in visit on 12/15/15  POCT glycosylated hemoglobin (Hb A1C)  Result Value Ref Range   Hemoglobin A1C 9.5    Est. average glucose Bld gHb Est-mCnc 226      3. Syncope,  unspecified syncope type Patient is not a great historian but it sounds as though she might have got a little hypoglycemic. She did not fall to the ground. She seemed to lower herself to  a seat and felt washed out for a while. I do not think she needs further workup at this time.  - EKG 12-Lead  4. Hyperlipidemia   - Lipid panel - TSH  5. Osteoarthritis This is continuing to be an ongoing issue for this patient. I am worried that it is putting her at risk of falls.      Richard Cranford Mon, MD  [facility]

## 2015-12-16 LAB — COMPREHENSIVE METABOLIC PANEL
ALK PHOS: 102 IU/L (ref 39–117)
ALT: 9 IU/L (ref 0–32)
AST: 18 IU/L (ref 0–40)
Albumin/Globulin Ratio: 1.8 (ref 1.2–2.2)
Albumin: 4 g/dL (ref 3.5–4.7)
BUN/Creatinine Ratio: 13 (ref 12–28)
BUN: 22 mg/dL (ref 8–27)
Bilirubin Total: <0.2 mg/dL (ref 0.0–1.2)
CO2: 25 mmol/L (ref 18–29)
CREATININE: 1.75 mg/dL — AB (ref 0.57–1.00)
Calcium: 9.6 mg/dL (ref 8.7–10.3)
Chloride: 106 mmol/L (ref 96–106)
GFR calc Af Amer: 31 mL/min/{1.73_m2} — ABNORMAL LOW (ref >59)
GFR calc non Af Amer: 27 mL/min/{1.73_m2} — ABNORMAL LOW (ref >59)
GLOBULIN, TOTAL: 2.2 g/dL (ref 1.5–4.5)
GLUCOSE: 93 mg/dL (ref 65–99)
Potassium: 4.4 mmol/L (ref 3.5–5.2)
SODIUM: 146 mmol/L — AB (ref 134–144)
Total Protein: 6.2 g/dL (ref 6.0–8.5)

## 2015-12-16 LAB — LIPID PANEL
CHOL/HDL RATIO: 2.9 ratio (ref 0.0–4.4)
Cholesterol, Total: 132 mg/dL (ref 100–199)
HDL: 46 mg/dL (ref >39)
LDL Calculated: 41 mg/dL (ref 0–99)
Triglycerides: 224 mg/dL — ABNORMAL HIGH (ref 0–149)
VLDL CHOLESTEROL CAL: 45 mg/dL — AB (ref 5–40)

## 2015-12-16 LAB — CBC WITH DIFFERENTIAL/PLATELET
BASOS ABS: 0 10*3/uL (ref 0.0–0.2)
BASOS: 0 %
EOS (ABSOLUTE): 0.1 10*3/uL (ref 0.0–0.4)
Eos: 3 %
Hematocrit: 37.5 % (ref 34.0–46.6)
Hemoglobin: 11.8 g/dL (ref 11.1–15.9)
Immature Grans (Abs): 0 10*3/uL (ref 0.0–0.1)
Immature Granulocytes: 0 %
Lymphocytes Absolute: 1.4 10*3/uL (ref 0.7–3.1)
Lymphs: 28 %
MCH: 26.2 pg — ABNORMAL LOW (ref 26.6–33.0)
MCHC: 31.5 g/dL (ref 31.5–35.7)
MCV: 83 fL (ref 79–97)
MONOS ABS: 0.4 10*3/uL (ref 0.1–0.9)
Monocytes: 8 %
NEUTROS ABS: 3.1 10*3/uL (ref 1.4–7.0)
Neutrophils: 61 %
PLATELETS: 223 10*3/uL (ref 150–379)
RBC: 4.5 x10E6/uL (ref 3.77–5.28)
RDW: 15.5 % — AB (ref 12.3–15.4)
WBC: 5 10*3/uL (ref 3.4–10.8)

## 2015-12-16 LAB — TSH: TSH: 1.63 u[IU]/mL (ref 0.450–4.500)

## 2015-12-31 ENCOUNTER — Other Ambulatory Visit: Payer: Self-pay | Admitting: Family Medicine

## 2016-01-01 ENCOUNTER — Other Ambulatory Visit: Payer: Self-pay | Admitting: Family Medicine

## 2016-01-14 DIAGNOSIS — R0602 Shortness of breath: Secondary | ICD-10-CM | POA: Diagnosis not present

## 2016-01-16 ENCOUNTER — Ambulatory Visit (INDEPENDENT_AMBULATORY_CARE_PROVIDER_SITE_OTHER): Payer: Medicare Other

## 2016-01-16 DIAGNOSIS — Z23 Encounter for immunization: Secondary | ICD-10-CM | POA: Diagnosis not present

## 2016-02-13 DIAGNOSIS — R0602 Shortness of breath: Secondary | ICD-10-CM | POA: Diagnosis not present

## 2016-02-16 DIAGNOSIS — H401132 Primary open-angle glaucoma, bilateral, moderate stage: Secondary | ICD-10-CM | POA: Diagnosis not present

## 2016-02-26 DIAGNOSIS — E119 Type 2 diabetes mellitus without complications: Secondary | ICD-10-CM | POA: Diagnosis not present

## 2016-02-26 DIAGNOSIS — B351 Tinea unguium: Secondary | ICD-10-CM | POA: Diagnosis not present

## 2016-03-14 ENCOUNTER — Ambulatory Visit (INDEPENDENT_AMBULATORY_CARE_PROVIDER_SITE_OTHER): Payer: Medicare Other | Admitting: Cardiovascular Disease

## 2016-03-14 ENCOUNTER — Encounter: Payer: Self-pay | Admitting: Cardiovascular Disease

## 2016-03-14 VITALS — BP 130/70 | HR 75 | Ht 63.0 in | Wt 159.5 lb

## 2016-03-14 DIAGNOSIS — I5032 Chronic diastolic (congestive) heart failure: Secondary | ICD-10-CM

## 2016-03-14 DIAGNOSIS — I10 Essential (primary) hypertension: Secondary | ICD-10-CM

## 2016-03-14 NOTE — Patient Instructions (Addendum)
Medication Instructions:  Your physician recommends that you continue on your current medications as directed. Please refer to the Current Medication list given to you today.   Labwork: none  Testing/Procedures: none  Follow-Up: Your physician wants you to follow-up in: 1 year with Dr. Fletcher Anon.  You will receive a reminder letter in the mail two months in advance. If you don't receive a letter, please call our office to schedule the follow-up appointment.   Any Other Special Instructions Will Be Listed Below (If Applicable).       If you need a refill on your cardiac medications before your next appointment, please call your pharmacy.   Low-Sodium Eating Plan Sodium raises blood pressure and causes water to be held in the body. Getting less sodium from food will help lower your blood pressure, reduce any swelling, and protect your heart, liver, and kidneys. We get sodium by adding salt (sodium chloride) to food. Most of our sodium comes from canned, boxed, and frozen foods. Restaurant foods, fast foods, and pizza are also very high in sodium. Even if you take medicine to lower your blood pressure or to reduce fluid in your body, getting less sodium from your food is important. WHAT IS MY PLAN? Most people should limit their sodium intake to 2,300 mg a day. Your health care provider recommends that you limit your sodium intake WHAT DO I NEED TO KNOW ABOUT THIS EATING PLAN? For the low-sodium eating plan, you will follow these general guidelines:  Choose foods with a % Daily Value for sodium of less than 5% (as listed on the food label).   Use salt-free seasonings or herbs instead of table salt or sea salt.   Check with your health care provider or pharmacist before using salt substitutes.   Eat fresh foods.  Eat more vegetables and fruits.  Limit canned vegetables. If you do use them, rinse them well to decrease the sodium.   Limit cheese to 1 oz (28 g) per day.   Eat  lower-sodium products, often labeled as "lower sodium" or "no salt added."  Avoid foods that contain monosodium glutamate (MSG). MSG is sometimes added to [place] food and some canned foods.  Check food labels (Nutrition Facts labels) on foods to learn how much sodium is in one serving.  Eat more home-cooked food and less restaurant, buffet, and fast food.  When eating at a restaurant, ask that your food be prepared with less salt, or no salt if possible.  HOW DO I READ FOOD LABELS FOR SODIUM INFORMATION? The Nutrition Facts label lists the amount of sodium in one serving of the food. If you eat more than one serving, you must multiply the listed amount of sodium by the number of servings. Food labels may also identify foods as:  Sodium free--Less than 5 mg in a serving.  Very low sodium--35 mg or less in a serving.  Low sodium--140 mg or less in a serving.  Light in sodium--50% less sodium in a serving. For example, if a food that usually has 300 mg of sodium is changed to become light in sodium, it will have 150 mg of sodium.  Reduced sodium--25% less sodium in a serving. For example, if a food that usually has 400 mg of sodium is changed to reduced sodium, it will have 300 mg of sodium. WHAT FOODS CAN I EAT? Grains Low-sodium cereals, including oats, puffed wheat and rice, and shredded wheat cereals. Low-sodium crackers. Unsalted rice and pasta. Lower-sodium bread.  Vegetables Frozen or fresh vegetables. Low-sodium or reduced-sodium canned vegetables. Low-sodium or reduced-sodium tomato sauce and paste. Low-sodium or reduced-sodium tomato and vegetable juices.  Fruits Fresh, frozen, and canned fruit. Fruit juice.  Meat and Other Protein Products Low-sodium canned tuna and salmon. Fresh or frozen meat, poultry, seafood, and fish. Lamb. Unsalted nuts. Dried beans, peas, and lentils without added salt. Unsalted canned beans. Homemade soups without salt. Eggs.  Dairy Milk. Soy  milk. Ricotta cheese. Low-sodium or reduced-sodium cheeses. Yogurt.  Condiments Fresh and dried herbs and spices. Salt-free seasonings. Onion and garlic powders. Low-sodium varieties of mustard and ketchup. Fresh or refrigerated horseradish. Lemon juice.  Fats and Oils Reduced-sodium salad dressings. Unsalted butter.  Other Unsalted popcorn and pretzels.  The items listed above may not be a complete list of recommended foods or beverages. Contact your dietitian for more options. WHAT FOODS ARE NOT RECOMMENDED? Grains Instant hot cereals. Bread stuffing, pancake, and biscuit mixes. Croutons. Seasoned rice or pasta mixes. Noodle soup cups. Boxed or frozen macaroni and cheese. Self-rising flour. Regular salted crackers. Vegetables Regular canned vegetables. Regular canned tomato sauce and paste. Regular tomato and vegetable juices. Frozen vegetables in sauces. Salted [place] fries. Olives. Angie Fava. Relishes. Sauerkraut. Salsa. Meat and Other Protein Products Salted, canned, smoked, spiced, or pickled meats, seafood, or fish. Bacon, ham, sausage, hot dogs, corned beef, chipped beef, and packaged luncheon meats. Salt pork. Jerky. Pickled herring. Anchovies, regular canned tuna, and sardines. Salted nuts. Dairy Processed cheese and cheese spreads. Cheese curds. Blue cheese and cottage cheese. Buttermilk.  Condiments Onion and garlic salt, seasoned salt, table salt, and sea salt. Canned and packaged gravies. Worcestershire sauce. Tartar sauce. Barbecue sauce. Teriyaki sauce. Soy sauce, including reduced sodium. Steak sauce. Fish sauce. Oyster sauce. Cocktail sauce. Horseradish that you find on the shelf. Regular ketchup and mustard. Meat flavorings and tenderizers. Bouillon cubes. Hot sauce. Tabasco sauce. Marinades. Taco seasonings. Relishes. Fats and Oils Regular salad dressings. Salted butter. Margarine. Ghee. Bacon fat.  Other Potato and tortilla chips. Corn chips and puffs. Salted  popcorn and pretzels. Canned or dried soups. Pizza. Frozen entrees and pot pies.  The items listed above may not be a complete list of foods and beverages to avoid. Contact your dietitian for more information.   This information is not intended to replace advice given to you by your health care provider. Make sure you discuss any questions you have with your health care provider.   Document Released: 10/08/2001 Document Revised: 05/09/2014 Document Reviewed: 02/20/2013 Elsevier Interactive Patient Education [employer].

## 2016-03-14 NOTE — Progress Notes (Signed)
Cardiology Office Note   Date:  03/14/2016   ID:  Storie, Heffern 08-20-1931, MRN [id number]  PCP:  [name], MD  Cardiologist:   Kathlyn Sacramento, MD   Chief Complaint  Patient presents with  . other    6 month follow up. Meds reviewed by the pt's bottles. "doing well."       History of Present Illness: Lori Stanley is a 82 y.o. female who presents for a followup visit regarding chronic diastolic heart failure . She has known history of hypertension, diabetes and CKD.  She was hospitalized in January 2015 with acute diastolic heart failure in the setting of uncontrolled hypertension. She had an echocardiogram done which showed an ejection fraction of 55-60% with moderate left ventricular hypertrophy and grade 1 diastolic dysfunction.  Nuclear stress test in January, 2015  showed no evidence of ischemia with normal ejection fraction. Renal artery duplex ultrasound showed no evidence of renal artery stenosis.  She has been doing extremely well over the last year with no hospitalizations for heart failure. She denies any chest pain. Shortness of breath is stable. Blood pressure has been well-controlled. She has been taking her medications regularly with no reported side effects. Most recent creatinine was 1.75 in August which is stable.   Past Medical History:  Diagnosis Date  . Acute heart failure ([place])   . Chronic low back pain   . Diabetes mellitus without complication ([place])   . Hyperlipidemia   . Hypertension   . MI (myocardial infarction)   . Obesity     Past Surgical History:  Procedure Laterality Date  . EYE SURGERY    . KNEE SURGERY    . THYROID SURGERY    . VAGINAL HYSTERECTOMY       Current Outpatient Prescriptions  Medication Sig Dispense Refill  . acetaminophen (TYLENOL) 500 MG tablet Take 1,000 mg by mouth every 6 (six) hours as needed.    [name] aspirin 81 MG EC tablet TAKE 1 TABLET (81 MG TOTAL) BY MOUTH DAILY. 90 tablet 3  . B-D UF  III MINI PEN NEEDLES 31G X 5 MM MISC USE TWICE A DAY AS DIRECTED 30 each 12  . Bepotastine Besilate (BEPREVE) 1.5 % SOLN Place 1 drop into both eyes daily.    . brimonidine (ALPHAGAN) 0.2 % ophthalmic solution 3 (three) times daily.    . carvedilol (COREG) 12.5 MG tablet TAKE 1 TABLET BY MOUTH TWICE A DAY 180 tablet 3  . carvedilol (COREG) 12.5 MG tablet TAKE 1 TABLET BY MOUTH TWICE A DAY 180 tablet 3  . clonazePAM (KLONOPIN) 0.5 MG tablet TAKE 1 TABLET BY MOUTH TWICE A DAY AS NEEDED FOR ANXIETY . TAKES 1/2 TO 1 TABLET AS NEEDED 30 tablet 1  . furosemide (LASIX) 20 MG tablet TAKE 1 TABLET (20 MG TOTAL) BY MOUTH 2 (TWO) TIMES DAILY. 180 tablet 3  . Insulin Lispro Prot & Lispro (HUMALOG MIX 75/25 KWIKPEN) (75-25) 100 UNIT/ML Kwikpen Inject 60 Units into the skin 2 (two) times daily. 30 mL 12  . latanoprost (XALATAN) 0.005 % ophthalmic solution Place 1 drop into the left eye at bedtime.     [name] loratadine (CLARITIN) 10 MG tablet Take 1 tablet (10 mg total) by mouth daily. 90 tablet 3  . quinapril (ACCUPRIL) 40 MG tablet TAKE 1 TABLET BY MOUTH EVERY DAY 90 tablet 3  . ranitidine (ZANTAC) 150 MG capsule Take 1 capsule (150 mg total) by mouth 2 (two) times daily.  30 capsule 12  . simvastatin (ZOCOR) 40 MG tablet Take 1 tablet (40 mg total) by mouth daily. 30 tablet 12  . traZODone (DESYREL) 150 MG tablet TAKE 1 TABLET BY MOUTH EVERY NIGHT AT BEDTIME 90 tablet 3   No current facility-administered medications for this visit.     Allergies:   Antihistamines, chlorpheniramine-type and Codeine    Social History:  The patient  reports that she has quit smoking. Her smoking use included Cigarettes. She has never used smokeless tobacco. She reports that she does not drink alcohol or use drugs.   Family History:  The patient's family history includes Heart attack in her mother.    ROS:  Please see the history of present illness.   Otherwise, review of systems are positive for none.   All other systems are  reviewed and negative.    PHYSICAL EXAM: VS:  BP 130/70 (BP Location: Left Arm, Patient Position: Sitting, Cuff Size: Normal)   Pulse 75   Ht 5\' 3"  (1.6 m)   Wt 159 lb 8 oz (72.3 kg)   BMI 28.25 kg/m  , BMI Body mass index is 28.25 kg/m. GEN: Well nourished, well developed, in no acute distress  HEENT: normal  Neck: no JVD, carotid bruits, or masses Cardiac: RRR; no rubs, or gallops,no edema . One out of 6 systolic ejection murmur in the aortic area. Respiratory:  clear to auscultation bilaterally, normal work of breathing GI: soft, nontender, nondistended, + BS MS: no deformity or atrophy  Skin: warm and dry, no rash Neuro:  Strength and sensation are intact Psych: euthymic mood, full affect   EKG:  EKG is ordered today. The ekg ordered today demonstrates  normal sinus rhythm with lateral T wave changes suggestive of ischemia.   Recent Labs: 12/15/2015: ALT 9; BUN 22; Creatinine, Ser 1.75; Platelets 223; Potassium 4.4; Sodium 146; TSH 1.630    Lipid Panel    Component Value Date/Time   CHOL 132 12/15/2015 1546   CHOL 137 05/13/2013 0344   TRIG 224 (H) 12/15/2015 1546   TRIG 104 05/13/2013 0344   HDL 46 12/15/2015 1546   HDL 43 05/13/2013 0344   CHOLHDL 2.9 12/15/2015 1546   VLDL 21 05/13/2013 0344   LDLCALC 41 12/15/2015 1546   LDLCALC 73 05/13/2013 0344      Wt Readings from Last 3 Encounters:  03/14/16 159 lb 8 oz (72.3 kg)  12/15/15 162 lb (73.5 kg)  07/16/15 163 lb (73.9 kg)      No flowsheet data found.    ASSESSMENT AND PLAN:  1.  Chronic diastolic heart failure: She is doing very well overall with no evidence of volume overload on current dose of furosemide 20 mg twice daily. Chronic kidney disease has been stable. I provided her with low sodium diet instructions.  2. Essential hypertension: Blood pressure is well controlled on current medications.  3. Hyperlipidemia: On simvastatin 40 mg once daily with most recent LDL 41.   Disposition:    FU with me in 1 year  Signed,  Kathlyn Sacramento, MD  03/14/2016 2:33 PM    [place]

## 2016-03-15 DIAGNOSIS — R0602 Shortness of breath: Secondary | ICD-10-CM | POA: Diagnosis not present

## 2016-03-17 ENCOUNTER — Ambulatory Visit (INDEPENDENT_AMBULATORY_CARE_PROVIDER_SITE_OTHER): Payer: Medicare Other | Admitting: Ophthalmology

## 2016-03-17 DIAGNOSIS — H34212 Partial retinal artery occlusion, left eye: Secondary | ICD-10-CM | POA: Diagnosis not present

## 2016-03-17 DIAGNOSIS — E11319 Type 2 diabetes mellitus with unspecified diabetic retinopathy without macular edema: Secondary | ICD-10-CM

## 2016-03-17 DIAGNOSIS — E113593 Type 2 diabetes mellitus with proliferative diabetic retinopathy without macular edema, bilateral: Secondary | ICD-10-CM

## 2016-03-17 DIAGNOSIS — I10 Essential (primary) hypertension: Secondary | ICD-10-CM | POA: Diagnosis not present

## 2016-03-17 DIAGNOSIS — H35033 Hypertensive retinopathy, bilateral: Secondary | ICD-10-CM | POA: Diagnosis not present

## 2016-03-31 ENCOUNTER — Other Ambulatory Visit: Payer: Self-pay | Admitting: Family Medicine

## 2016-04-12 ENCOUNTER — Ambulatory Visit (INDEPENDENT_AMBULATORY_CARE_PROVIDER_SITE_OTHER): Payer: Medicare Other | Admitting: Family Medicine

## 2016-04-12 VITALS — BP 138/76 | HR 72 | Temp 98.3°F | Resp 14 | Wt 162.0 lb

## 2016-04-12 DIAGNOSIS — I10 Essential (primary) hypertension: Secondary | ICD-10-CM

## 2016-04-12 DIAGNOSIS — E1121 Type 2 diabetes mellitus with diabetic nephropathy: Secondary | ICD-10-CM

## 2016-04-12 DIAGNOSIS — F3289 Other specified depressive episodes: Secondary | ICD-10-CM

## 2016-04-12 DIAGNOSIS — F419 Anxiety disorder, unspecified: Secondary | ICD-10-CM | POA: Diagnosis not present

## 2016-04-12 DIAGNOSIS — K047 Periapical abscess without sinus: Secondary | ICD-10-CM

## 2016-04-12 DIAGNOSIS — K219 Gastro-esophageal reflux disease without esophagitis: Secondary | ICD-10-CM

## 2016-04-12 LAB — POCT GLYCOSYLATED HEMOGLOBIN (HGB A1C): Hemoglobin A1C: 9.1

## 2016-04-12 MED ORDER — AMOXICILLIN 500 MG PO CAPS: 500.0000 mg | ORAL_CAPSULE | Freq: Three times a day (TID) | ORAL | 0 refills | Status: DC

## 2016-04-12 MED ORDER — PAROXETINE HCL 10 MG PO TABS: 10.0000 mg | ORAL_TABLET | Freq: Every day | ORAL | 12 refills | Status: DC

## 2016-04-12 NOTE — Progress Notes (Signed)
Lori Stanley She has right sided  MRN: [id number] DOB: [birth year]  Subjective:  HPI  Patient is here for follow up. Last office visit was routine check up chronic issues in August 2017. Diabetes: patient checks her sugar and readings have been 99, 101, 122. She usually checks her sugar once in the morning. No numbness or tingling sensation. Last eye exam was in November per patient. Pt needs to have a tooth pulled but has not made arrangements.She c/o right sided facial pain.  Lab Results  Component Value Date   HGBA1C 9.5 12/15/2015   Patient has felt more anxious the past week or so. She is taking Klonopin 1 tablet in the morning.  Last labs were done in August routine. Patient Active Problem List   Diagnosis Date Noted  . Syncopal episodes 12/15/2015  . Absolute anemia 04/14/2015  . Anxiety 04/14/2015  . Clinical depression 04/14/2015  . Essential (primary) hypertension 04/14/2015  . Gastro-esophageal reflux disease without esophagitis 04/14/2015  . Arthritis, degenerative 04/14/2015  . Allergic rhinitis 04/14/2015  . Type 2 diabetes mellitus ([place]) 04/14/2015  . Hyperlipidemia 02/27/2015  . Glaucoma 12/23/2014  . Diabetes ([place]) 12/23/2014  . GERD (gastroesophageal reflux disease) 12/23/2014  . Essential hypertension, malignant 05/24/2013  . SOB (shortness of breath) 05/24/2013  . Chronic diastolic heart failure ([place]) 05/24/2013    Past Medical History:  Diagnosis Date  . Acute heart failure ([place])   . Chronic low back pain   . Diabetes mellitus without complication ([place])   . Hyperlipidemia   . Hypertension   . MI (myocardial infarction)   . Obesity     Social History   Social History  . Marital status: Widowed    Spouse name: N/A  . Number of children: N/A  . Years of education: N/A   Occupational History  . Not on file.   Social History Main Topics  . Smoking status: Former Smoker    Types: Cigarettes  . Smokeless tobacco: Never Used  .  Alcohol use No  . Drug use: No  . Sexual activity: No   Other Topics Concern  . Not on file   Social History Narrative  . No narrative on file    Outpatient Encounter Prescriptions as of 04/12/2016  Medication Sig  . acetaminophen (TYLENOL) 500 MG tablet Take 1,000 mg by mouth every 6 (six) hours as needed.  [name] aspirin 81 MG EC tablet TAKE 1 TABLET (81 MG TOTAL) BY MOUTH DAILY.  [name] B-D UF III MINI PEN NEEDLES 31G X 5 MM MISC USE TWICE A DAY AS DIRECTED  . Bepotastine Besilate (BEPREVE) 1.5 % SOLN Place 1 drop into both eyes daily.  . brimonidine (ALPHAGAN) 0.2 % ophthalmic solution 3 (three) times daily.  . carvedilol (COREG) 12.5 MG tablet TAKE 1 TABLET BY MOUTH TWICE A DAY  . clonazePAM (KLONOPIN) 0.5 MG tablet TAKE 1 TABLET BY MOUTH TWICE A DAY AS NEEDED  . furosemide (LASIX) 20 MG tablet TAKE 1 TABLET (20 MG TOTAL) BY MOUTH 2 (TWO) TIMES DAILY.  [name] Insulin Lispro Prot & Lispro (HUMALOG MIX 75/25 KWIKPEN) (75-25) 100 UNIT/ML Kwikpen Inject 60 Units into the skin 2 (two) times daily.  [name] latanoprost (XALATAN) 0.005 % ophthalmic solution Place 1 drop into the left eye at bedtime.   [name] loratadine (CLARITIN) 10 MG tablet Take 1 tablet (10 mg total) by mouth daily.  . quinapril (ACCUPRIL) 40 MG tablet TAKE 1 TABLET BY MOUTH EVERY DAY  . ranitidine (ZANTAC) 150  MG capsule Take 1 capsule (150 mg total) by mouth 2 (two) times daily.  . simvastatin (ZOCOR) 40 MG tablet Take 1 tablet (40 mg total) by mouth daily.  . traZODone (DESYREL) 150 MG tablet TAKE 1 TABLET BY MOUTH EVERY NIGHT AT BEDTIME  . [DISCONTINUED] carvedilol (COREG) 12.5 MG tablet TAKE 1 TABLET BY MOUTH TWICE A DAY   No facility-administered encounter medications on file as of 04/12/2016.     Allergies  Allergen Reactions  . Antihistamines, Chlorpheniramine-Type   . Codeine     Review of Systems  Constitutional: Positive for malaise/fatigue.  HENT: Positive for sinus pain.        Teeth falling out  Eyes: Negative.     Respiratory: Positive for shortness of breath.   Cardiovascular: Negative.   Gastrointestinal: Negative.   Genitourinary: Negative.   Musculoskeletal: Positive for joint pain. Negative for back pain, falls, myalgias and neck pain.       Unsteady gait, using a cane, stiffness  Neurological: Positive for weakness. Negative for dizziness.  Psychiatric/Behavioral: Positive for depression. The patient is nervous/anxious.     Objective:  BP 138/76   Pulse 72   Temp 98.3 F (36.8 C)   Resp 14   Wt 162 lb (73.5 kg)   BMI 28.70 kg/m   Physical Exam  Constitutional: She is oriented to person, place, and time and well-developed, well-nourished, and in no distress.  HENT:  Head: Normocephalic and atraumatic.  Right Ear: External ear normal.  Left Ear: External ear normal.  Nose: Nose normal.  Mouth/Throat: Oropharynx is clear and moist.  Swelling in the right  Face lateral to the right nares.  Pt has poor dentition No obvious issues that I can determine on exam.  Eyes: Conjunctivae are normal. Pupils are equal, round, and reactive to light.  Neck: Normal range of motion. Neck supple.  Cardiovascular: Normal rate, regular rhythm, normal heart sounds and intact distal pulses.   No murmur heard. Pulmonary/Chest: Effort normal and breath sounds normal. No respiratory distress. She has no wheezes.  Musculoskeletal: She exhibits no tenderness.  Neurological: She is alert and oriented to person, place, and time.  Using a cane    Assessment and Plan :  1. Essential hypertension, malignant Stable.  2. Type 2 diabetes mellitus with diabetic nephropathy, without long-term current use of insulin (HCC) A1C 9.1. Better. Continue current medication. - POCT HgB A1C  3. Other depression Will start Paroxetine.  4. Gastroesophageal reflux disease, esophagitis presence not specified  5. Anxiety I hope Paroxetine helps this too.  6. Tooth infection I think patient has dental infection. Will  treat with Amoxil and advised patient to contact her dentist to let them know and consult with them.  HPI, Exam and A&P transcribed under direction and in the presence of Miguel Aschoff, MD. I have done the exam and reviewed the chart and it is accurate to the best of my knowledge. Development worker, community has been used and  any errors in dictation or transcription are unintentional. Miguel Aschoff M.D. [facility]

## 2016-04-14 DIAGNOSIS — R0602 Shortness of breath: Secondary | ICD-10-CM | POA: Diagnosis not present

## 2016-04-26 ENCOUNTER — Other Ambulatory Visit: Payer: Self-pay | Admitting: Family Medicine

## 2016-04-27 NOTE — Telephone Encounter (Signed)
Last ov 04/12/16, last filled 05/05/15. Please review. Thank you. sd

## 2016-05-15 DIAGNOSIS — R0602 Shortness of breath: Secondary | ICD-10-CM | POA: Diagnosis not present

## 2016-05-30 DIAGNOSIS — B351 Tinea unguium: Secondary | ICD-10-CM | POA: Diagnosis not present

## 2016-05-30 DIAGNOSIS — E119 Type 2 diabetes mellitus without complications: Secondary | ICD-10-CM | POA: Diagnosis not present

## 2016-06-13 ENCOUNTER — Ambulatory Visit: Payer: Medicare Other | Admitting: Family Medicine

## 2016-06-15 DIAGNOSIS — H401132 Primary open-angle glaucoma, bilateral, moderate stage: Secondary | ICD-10-CM | POA: Diagnosis not present

## 2016-06-15 DIAGNOSIS — R0602 Shortness of breath: Secondary | ICD-10-CM | POA: Diagnosis not present

## 2016-06-16 ENCOUNTER — Ambulatory Visit (INDEPENDENT_AMBULATORY_CARE_PROVIDER_SITE_OTHER): Payer: Medicare Other | Admitting: Family Medicine

## 2016-06-16 ENCOUNTER — Ambulatory Visit: Payer: Medicare Other

## 2016-06-16 VITALS — BP 162/72 | HR 82 | Temp 98.4°F | Resp 16 | Wt 159.0 lb

## 2016-06-16 DIAGNOSIS — F419 Anxiety disorder, unspecified: Secondary | ICD-10-CM | POA: Diagnosis not present

## 2016-06-16 DIAGNOSIS — I10 Essential (primary) hypertension: Secondary | ICD-10-CM

## 2016-06-16 DIAGNOSIS — E1121 Type 2 diabetes mellitus with diabetic nephropathy: Secondary | ICD-10-CM

## 2016-06-16 DIAGNOSIS — F3289 Other specified depressive episodes: Secondary | ICD-10-CM | POA: Diagnosis not present

## 2016-06-16 NOTE — Progress Notes (Signed)
Lori Stanley  MRN: [id number] DOB: 05-14-1931  Subjective:  HPI  Patient is here for 2 months follow up. Last office visit was on 04/12/16. At that time routine health issues were addressed. Depression and anxiety were an issue and Paroxetine was started for the patient. Patient took new medication for about 1 to 2 weeks and states it made her feel "funny", did not like how it made her feel. She stopped taking this medication. She feels better emotionally then she was in December after several deaths she had to deal with. She feels like she is coping with things better. Sleeping ok. Depression screen [facility] 2/9 06/16/2016 06/16/2016 04/14/2015  Decreased Interest 0 0 0  Down, Depressed, Hopeless 1 1 0  PHQ - 2 Score 1 1 0  Altered sleeping 0 - -  Tired, decreased energy 1 - -  Change in appetite 0 - -  Feeling bad or failure about yourself  0 - -  Trouble concentrating 0 - -  Moving slowly or fidgety/restless 0 - -  Suicidal thoughts 0 - -  PHQ-9 Score 2 - -   Patient Active Problem List   Diagnosis Date Noted  . Syncopal episodes 12/15/2015  . Absolute anemia 04/14/2015  . Anxiety 04/14/2015  . Clinical depression 04/14/2015  . Essential (primary) hypertension 04/14/2015  . Gastro-esophageal reflux disease without esophagitis 04/14/2015  . Arthritis, degenerative 04/14/2015  . Allergic rhinitis 04/14/2015  . Type 2 diabetes mellitus ([place]) 04/14/2015  . Hyperlipidemia 02/27/2015  . Glaucoma 12/23/2014  . Diabetes ([place]) 12/23/2014  . GERD (gastroesophageal reflux disease) 12/23/2014  . Essential hypertension, malignant 05/24/2013  . SOB (shortness of breath) 05/24/2013  . Chronic diastolic heart failure ([place]) 05/24/2013    Past Medical History:  Diagnosis Date  . Acute heart failure ([place])   . Chronic low back pain   . Diabetes mellitus without complication ([place])   . Hyperlipidemia   . Hypertension   . MI (myocardial infarction)   . Obesity     Social History    Social History  . Marital status: Widowed    Spouse name: N/A  . Number of children: N/A  . Years of education: N/A   Occupational History  . Not on file.   Social History Main Topics  . Smoking status: Former Smoker    Types: Cigarettes  . Smokeless tobacco: Never Used  . Alcohol use No  . Drug use: No  . Sexual activity: No   Other Topics Concern  . Not on file   Social History Narrative  . No narrative on file    Outpatient Encounter Prescriptions as of 06/16/2016  Medication Sig  . acetaminophen (TYLENOL) 500 MG tablet Take 1,000 mg by mouth every 6 (six) hours as needed.  [name] aspirin 81 MG EC tablet TAKE 1 TABLET (81 MG TOTAL) BY MOUTH DAILY.  [name] B-D UF III MINI PEN NEEDLES 31G X 5 MM MISC USE TWICE A DAY AS DIRECTED  . Bepotastine Besilate (BEPREVE) 1.5 % SOLN Place 1 drop into both eyes daily.  . brimonidine (ALPHAGAN) 0.2 % ophthalmic solution 3 (three) times daily.  . carvedilol (COREG) 12.5 MG tablet TAKE 1 TABLET BY MOUTH TWICE A DAY  . clonazePAM (KLONOPIN) 0.5 MG tablet TAKE 1 TABLET BY MOUTH TWICE A DAY AS NEEDED  . furosemide (LASIX) 20 MG tablet TAKE 1 TABLET (20 MG TOTAL) BY MOUTH 2 (TWO) TIMES DAILY.  [name] Insulin Lispro Prot & Lispro (HUMALOG MIX 75/25 KWIKPEN) (75-25)  100 UNIT/ML Kwikpen Inject 60 Units into the skin 2 (two) times daily.  [name] latanoprost (XALATAN) 0.005 % ophthalmic solution Place 1 drop into the left eye at bedtime.   [name] loratadine (CLARITIN) 10 MG tablet Take 1 tablet (10 mg total) by mouth daily.  . quinapril (ACCUPRIL) 40 MG tablet TAKE 1 TABLET BY MOUTH EVERY DAY  . ranitidine (ZANTAC) 150 MG capsule Take 1 capsule (150 mg total) by mouth 2 (two) times daily.  . simvastatin (ZOCOR) 40 MG tablet Take 1 tablet (40 mg total) by mouth daily.  . traZODone (DESYREL) 150 MG tablet TAKE 1 TABLET BY MOUTH EVERY NIGHT AT BEDTIME  . [DISCONTINUED] amoxicillin (AMOXIL) 500 MG capsule Take 1 capsule (500 mg total) by mouth 3 (three) times daily.  .  [DISCONTINUED] PARoxetine (PAXIL) 10 MG tablet Take 1 tablet (10 mg total) by mouth daily.   No facility-administered encounter medications on file as of 06/16/2016.     Allergies  Allergen Reactions  . Antihistamines, Chlorpheniramine-Type   . Codeine   . Paxil [Paroxetine]     "makes her feel funny" not in a good way    Review of Systems  Constitutional: Positive for malaise/fatigue.  Respiratory: Negative.   Cardiovascular: Negative.   Musculoskeletal: Positive for joint pain.       Unsteady gait. Using a cane and wheelchair.  Psychiatric/Behavioral: The patient is nervous/anxious.        Better on the medication    Objective:  BP (!) 162/72   Pulse 82   Temp 98.4 F (36.9 C)   Resp 16   Wt 159 lb (72.1 kg)   BMI 28.17 kg/m   Physical Exam  Constitutional: She is oriented to person, place, and time and well-developed, well-nourished, and in no distress.  HENT:  Head: Normocephalic and atraumatic.  Eyes: Conjunctivae are normal. Pupils are equal, round, and reactive to light.  Neck: Normal range of motion. Neck supple.  Cardiovascular: Normal rate, regular rhythm, normal heart sounds and intact distal pulses.   No murmur heard. Pulmonary/Chest: Effort normal and breath sounds normal. No respiratory distress. She has no wheezes.  Musculoskeletal: She exhibits edema (1+). She exhibits no tenderness.  Neurological: She is alert and oriented to person, place, and time.  Psychiatric: Affect normal.   Assessment and Plan :  1. Other depression Stable. Better.Chronic since the death of her 48yo son.  2. Anxiety Stable. Better.  3. Type 2 diabetes mellitus with diabetic nephropathy, without long-term current use of insulin (HCC) Check A1C in 2 months.  4. Essential hypertension, malignant Stable for now.  HPI, Exam and A&P transcribed under direction and in the presence of Miguel Aschoff, MD. I have done the exam and reviewed the chart and it is accurate to the  best of my knowledge. Development worker, community has been used and  any errors in dictation or transcription are unintentional. Miguel Aschoff M.D. [facility]

## 2016-07-11 ENCOUNTER — Other Ambulatory Visit: Payer: Self-pay | Admitting: Family Medicine

## 2016-07-11 DIAGNOSIS — K219 Gastro-esophageal reflux disease without esophagitis: Secondary | ICD-10-CM

## 2016-07-13 DIAGNOSIS — R0602 Shortness of breath: Secondary | ICD-10-CM | POA: Diagnosis not present

## 2016-08-13 DIAGNOSIS — R0602 Shortness of breath: Secondary | ICD-10-CM | POA: Diagnosis not present

## 2016-08-22 ENCOUNTER — Encounter: Payer: Self-pay | Admitting: Family Medicine

## 2016-08-22 ENCOUNTER — Ambulatory Visit (INDEPENDENT_AMBULATORY_CARE_PROVIDER_SITE_OTHER): Payer: Medicare Other | Admitting: Family Medicine

## 2016-08-22 VITALS — BP 140/64 | HR 78 | Temp 97.7°F | Resp 16 | Wt 158.0 lb

## 2016-08-22 DIAGNOSIS — I10 Essential (primary) hypertension: Secondary | ICD-10-CM | POA: Diagnosis not present

## 2016-08-22 DIAGNOSIS — F419 Anxiety disorder, unspecified: Secondary | ICD-10-CM | POA: Diagnosis not present

## 2016-08-22 DIAGNOSIS — E1121 Type 2 diabetes mellitus with diabetic nephropathy: Secondary | ICD-10-CM

## 2016-08-22 DIAGNOSIS — F3289 Other specified depressive episodes: Secondary | ICD-10-CM

## 2016-08-22 LAB — POCT GLYCOSYLATED HEMOGLOBIN (HGB A1C): Hemoglobin A1C: 9.1

## 2016-08-22 NOTE — Progress Notes (Signed)
Subjective:  HPI  Diabetes Mellitus Type II, Follow-up:   Lab Results  Component Value Date   HGBA1C 9.1 04/12/2016   HGBA1C 9.5 12/15/2015   HGBA1C 8.6 07/16/2015    Last seen for diabetes 2 months ago.  Management since then includes none. She reports good compliance with treatment. She is not having side effects.  Current symptoms include hypoglycemia  Home blood sugar records: 114 this morning  Episodes of hypoglycemia? yes - about every other week. Around 55's.    Current Insulin Regimen: 55 units units twice a day  Pertinent Labs:    Component Value Date/Time   CHOL 132 12/15/2015 1546   CHOL 137 05/13/2013 0344   TRIG 224 (H) 12/15/2015 1546   TRIG 104 05/13/2013 0344   HDL 46 12/15/2015 1546   HDL 43 05/13/2013 0344   LDLCALC 41 12/15/2015 1546   LDLCALC 73 05/13/2013 0344   CREATININE 1.75 (H) 12/15/2015 1546   CREATININE 2.27 (H) 05/15/2013 0512    Wt Readings from Last 3 Encounters:  08/22/16 158 lb (71.7 kg)  06/16/16 159 lb (72.1 kg)  04/12/16 162 lb (73.5 kg)    ------------------------------------------------------------------------    Prior to Admission medications   Medication Sig Start Date End Date Taking? Authorizing Provider  acetaminophen (TYLENOL) 500 MG tablet Take 1,000 mg by mouth every 6 (six) hours as needed.    Historical Provider, MD  aspirin 81 MG EC tablet TAKE 1 TABLET (81 MG TOTAL) BY MOUTH DAILY. 12/31/15   Everli Rother Maceo Pro., MD  B-D UF III MINI PEN NEEDLES 31G X 5 MM MISC USE TWICE A DAY AS DIRECTED 12/31/15   Jerrol Banana., MD  Bepotastine Besilate (BEPREVE) 1.5 % SOLN Place 1 drop into both eyes daily.    Historical Provider, MD  brimonidine (ALPHAGAN) 0.2 % ophthalmic solution 3 (three) times daily.    Historical Provider, MD  carvedilol (COREG) 12.5 MG tablet TAKE 1 TABLET BY MOUTH TWICE A DAY 01/05/16   Janelie Goltz Maceo Pro., MD  clonazePAM (KLONOPIN) 0.5 MG tablet TAKE 1 TABLET BY MOUTH TWICE A DAY AS  NEEDED 03/31/16   Jerrol Banana., MD  furosemide (LASIX) 20 MG tablet TAKE 1 TABLET (20 MG TOTAL) BY MOUTH 2 (TWO) TIMES DAILY. 11/16/15   Rebel Willcutt Maceo Pro., MD  Insulin Lispro Prot & Lispro (HUMALOG MIX 75/25 KWIKPEN) (75-25) 100 UNIT/ML Kwikpen Inject 60 Units into the skin 2 (two) times daily. 12/08/15   Cristofer Yaffe Maceo Pro., MD  latanoprost (XALATAN) 0.005 % ophthalmic solution Place 1 drop into the left eye at bedtime.  02/11/15   Historical Provider, MD  loratadine (CLARITIN) 10 MG tablet Take 1 tablet (10 mg total) by mouth daily. 12/15/14   Jerrol Banana., MD  quinapril (ACCUPRIL) 40 MG tablet TAKE 1 TABLET BY MOUTH EVERY DAY 10/05/15   Jerrol Banana., MD  ranitidine (ZANTAC) 150 MG capsule Take 1 capsule (150 mg total) by mouth 2 (two) times daily. 12/23/14   Eilish Mcdaniel Maceo Pro., MD  ranitidine (ZANTAC) 150 MG tablet TAKE 1 TABLET (150 MG TOTAL) BY MOUTH 2 (TWO) TIMES DAILY. 07/11/16   Offie Pickron Maceo Pro., MD  simvastatin (ZOCOR) 40 MG tablet Take 1 tablet (40 mg total) by mouth daily. 11/16/15   Jerrol Banana., MD  traZODone (DESYREL) 150 MG tablet TAKE 1 TABLET BY MOUTH EVERY NIGHT AT BEDTIME 04/27/16   Carmon Ginsberg, PA  Patient Active Problem List   Diagnosis Date Noted  . Syncopal episodes 12/15/2015  . Absolute anemia 04/14/2015  . Anxiety 04/14/2015  . Clinical depression 04/14/2015  . Essential (primary) hypertension 04/14/2015  . Gastro-esophageal reflux disease without esophagitis 04/14/2015  . Arthritis, degenerative 04/14/2015  . Allergic rhinitis 04/14/2015  . Type 2 diabetes mellitus ([place]) 04/14/2015  . Hyperlipidemia 02/27/2015  . Glaucoma 12/23/2014  . Diabetes ([place]) 12/23/2014  . GERD (gastroesophageal reflux disease) 12/23/2014  . Essential hypertension, malignant 05/24/2013  . SOB (shortness of breath) 05/24/2013  . Chronic diastolic heart failure ([place]) 05/24/2013    Past Medical History:  Diagnosis Date  . Acute heart  failure ([place])   . Chronic low back pain   . Diabetes mellitus without complication ([place])   . Hyperlipidemia   . Hypertension   . MI (myocardial infarction) ([place])   . Obesity     Social History   Social History  . Marital status: Widowed    Spouse name: N/A  . Number of children: N/A  . Years of education: N/A   Occupational History  . Not on file.   Social History Main Topics  . Smoking status: Former Smoker    Types: Cigarettes  . Smokeless tobacco: Never Used  . Alcohol use No  . Drug use: No  . Sexual activity: No   Other Topics Concern  . Not on file   Social History Narrative  . No narrative on file    Allergies  Allergen Reactions  . Antihistamines, Chlorpheniramine-Type   . Codeine   . Paxil [Paroxetine]     "makes her feel funny" not in a good way    Review of Systems  Constitutional: Negative.   HENT: Negative.   Eyes: Negative.   Respiratory: Negative.   Cardiovascular: Negative.   Gastrointestinal: Negative.   Genitourinary: Negative.   Musculoskeletal: Negative.   Skin: Negative.   Neurological: Negative.   Endo/Heme/Allergies: Negative.   Psychiatric/Behavioral: Negative.     Immunization History  Administered Date(s) Administered  . Influenza, High Dose Seasonal PF 02/05/2015, 01/16/2016  . Pneumococcal Conjugate-13 02/04/2014  . Pneumococcal Polysaccharide-23 04/07/2002    Objective:  BP 140/64 (BP Location: Left Arm, Patient Position: Sitting, Cuff Size: Normal)   Pulse 78   Temp 97.7 F (36.5 C) (Oral)   Resp 16   Wt 158 lb (71.7 kg)   SpO2 97%   BMI 27.99 kg/m   Physical Exam  Constitutional: She is oriented to person, place, and time and well-developed, well-nourished, and in no distress.  HENT:  Head: Normocephalic and atraumatic.  Right Ear: External ear normal.  Left Ear: External ear normal.  Eyes: Conjunctivae and EOM are normal. Pupils are equal, round, and reactive to light.  Neck: Normal range of motion. Neck  supple.  Cardiovascular: Normal rate, regular rhythm, normal heart sounds and intact distal pulses.   Pulmonary/Chest: Effort normal and breath sounds normal.  Abdominal: Soft.  Musculoskeletal: Normal range of motion. She exhibits edema (trace).  Neurological: She is alert and oriented to person, place, and time. She has normal reflexes. Gait normal. GCS score is 15.  Skin: Skin is warm and dry.  Psychiatric: Mood, memory, affect and judgment normal.    Lab Results  Component Value Date   WBC 5.0 12/15/2015   HGB 12.8 05/13/2013   HCT 37.5 12/15/2015   PLT 223 12/15/2015   GLUCOSE 93 12/15/2015   CHOL 132 12/15/2015   TRIG 224 (H) 12/15/2015   HDL  46 12/15/2015   LDLCALC 41 12/15/2015   TSH 1.630 12/15/2015   HGBA1C 9.1 04/12/2016    CMP     Component Value Date/Time   NA 146 (H) 12/15/2015 1546   NA 132 (L) 05/15/2013 0512   K 4.4 12/15/2015 1546   K 4.0 05/15/2013 0512   CL 106 12/15/2015 1546   CL 99 05/15/2013 0512   CO2 25 12/15/2015 1546   CO2 28 05/15/2013 0512   GLUCOSE 93 12/15/2015 1546   GLUCOSE 95 05/15/2013 0512   BUN 22 12/15/2015 1546   BUN 80 (H) 05/15/2013 0512   CREATININE 1.75 (H) 12/15/2015 1546   CREATININE 2.27 (H) 05/15/2013 0512   CALCIUM 9.6 12/15/2015 1546   CALCIUM 8.0 (L) 05/15/2013 0512   PROT 6.2 12/15/2015 1546   PROT 7.7 05/11/2013 2221   ALBUMIN 4.0 12/15/2015 1546   ALBUMIN 3.6 05/11/2013 2221   AST 18 12/15/2015 1546   AST 46 (H) 05/11/2013 2221   ALT 9 12/15/2015 1546   ALT 33 05/11/2013 2221   ALKPHOS 102 12/15/2015 1546   ALKPHOS 107 05/11/2013 2221   BILITOT <0.2 12/15/2015 1546   BILITOT 0.2 05/11/2013 2221   GFRNONAA 27 (L) 12/15/2015 1546   GFRNONAA 20 (L) 05/15/2013 0512   GFRAA 31 (L) 12/15/2015 1546   GFRAA 23 (L) 05/15/2013 0512    Assessment and Plan :  1. Type 2 diabetes mellitus with diabetic nephropathy, without long-term current use of insulin (HCC)  - POCT HgB A1C 9.1 today. Stable. Pt is having  blood sugars in the 70's which is low for pt. Will decrease insulin to 50 units twice daily.    2. Essential hypertension, malignant Stable.  3. Other depression   4. Anxiety 5.Hypertensive nephropathy  HPI, Exam, and A&P Transcribed under the direction and in the presence of Delson Dulworth L. Cranford Mon, MD  Electronically Signed: Katina Dung, [place] MD [facility] 08/22/2016 11:26 AM

## 2016-08-22 NOTE — Patient Instructions (Addendum)
Decrease insulin to 50 units twice a day. Dental visit when possible.

## 2016-08-29 DIAGNOSIS — B351 Tinea unguium: Secondary | ICD-10-CM | POA: Diagnosis not present

## 2016-08-29 DIAGNOSIS — E119 Type 2 diabetes mellitus without complications: Secondary | ICD-10-CM | POA: Diagnosis not present

## 2016-09-12 DIAGNOSIS — R0602 Shortness of breath: Secondary | ICD-10-CM | POA: Diagnosis not present

## 2016-09-21 ENCOUNTER — Ambulatory Visit (INDEPENDENT_AMBULATORY_CARE_PROVIDER_SITE_OTHER): Payer: Medicare Other | Admitting: Ophthalmology

## 2016-09-21 DIAGNOSIS — H35033 Hypertensive retinopathy, bilateral: Secondary | ICD-10-CM

## 2016-09-21 DIAGNOSIS — I10 Essential (primary) hypertension: Secondary | ICD-10-CM

## 2016-09-21 DIAGNOSIS — E11319 Type 2 diabetes mellitus with unspecified diabetic retinopathy without macular edema: Secondary | ICD-10-CM

## 2016-09-21 DIAGNOSIS — E113593 Type 2 diabetes mellitus with proliferative diabetic retinopathy without macular edema, bilateral: Secondary | ICD-10-CM

## 2016-09-21 LAB — HM DIABETES EYE EXAM

## 2016-09-24 ENCOUNTER — Other Ambulatory Visit: Payer: Self-pay | Admitting: Family Medicine

## 2016-10-13 DIAGNOSIS — R0602 Shortness of breath: Secondary | ICD-10-CM | POA: Diagnosis not present

## 2016-10-13 DIAGNOSIS — H401132 Primary open-angle glaucoma, bilateral, moderate stage: Secondary | ICD-10-CM | POA: Diagnosis not present

## 2016-10-16 ENCOUNTER — Other Ambulatory Visit: Payer: Self-pay | Admitting: Family Medicine

## 2016-10-17 ENCOUNTER — Telehealth: Payer: Self-pay | Admitting: Family Medicine

## 2016-10-17 NOTE — Telephone Encounter (Signed)
Pt can be worked in at [facility], 10:15 or 10:45 am tomorrow 10/18/16. Thank you-aa

## 2016-10-17 NOTE — Telephone Encounter (Signed)
Pt fell last week and hurt her arm.  She does not think it is broken because she can use it but she wants to see Dr. [name] tomorrow.  Is there a time I can put her on the schd.  Thanks [employer]

## 2016-10-19 ENCOUNTER — Ambulatory Visit (INDEPENDENT_AMBULATORY_CARE_PROVIDER_SITE_OTHER): Payer: Medicare Other | Admitting: Family Medicine

## 2016-10-19 ENCOUNTER — Ambulatory Visit
Admission: RE | Admit: 2016-10-19 | Discharge: 2016-10-19 | Disposition: A | Discharge status: Home / Self Care | Payer: Medicare Other | Source: Ambulatory Visit | Attending: Family Medicine | Admitting: Family Medicine

## 2016-10-19 VITALS — BP 165/62 | HR 80 | Temp 98.7°F | Resp 14 | Wt 158.0 lb

## 2016-10-19 DIAGNOSIS — M7022 Olecranon bursitis, left elbow: Secondary | ICD-10-CM

## 2016-10-19 DIAGNOSIS — Y92009 Unspecified place in unspecified non-institutional (private) residence as the place of occurrence of the external cause: Secondary | ICD-10-CM | POA: Insufficient documentation

## 2016-10-19 DIAGNOSIS — M7989 Other specified soft tissue disorders: Secondary | ICD-10-CM | POA: Diagnosis not present

## 2016-10-19 DIAGNOSIS — W19XXXA Unspecified fall, initial encounter: Secondary | ICD-10-CM | POA: Insufficient documentation

## 2016-10-19 DIAGNOSIS — L03114 Cellulitis of left upper limb: Secondary | ICD-10-CM

## 2016-10-19 DIAGNOSIS — Z23 Encounter for immunization: Secondary | ICD-10-CM

## 2016-10-19 DIAGNOSIS — S50312A Abrasion of left elbow, initial encounter: Secondary | ICD-10-CM | POA: Diagnosis not present

## 2016-10-19 DIAGNOSIS — S6992XA Unspecified injury of left wrist, hand and finger(s), initial encounter: Secondary | ICD-10-CM | POA: Diagnosis not present

## 2016-10-19 DIAGNOSIS — L089 Local infection of the skin and subcutaneous tissue, unspecified: Secondary | ICD-10-CM

## 2016-10-19 DIAGNOSIS — S5002XA Contusion of left elbow, initial encounter: Secondary | ICD-10-CM | POA: Diagnosis not present

## 2016-10-19 MED ORDER — AMOXICILLIN-POT CLAVULANATE 875-125 MG PO TABS: 1.0000 | ORAL_TABLET | Freq: Two times a day (BID) | ORAL | 0 refills | Status: DC

## 2016-10-19 NOTE — Progress Notes (Signed)
Lori Stanley  MRN: [id number] DOB: 1931-10-10  Subjective:  HPI  Patient is here to follow up after a fall. Patient states this happened about 1 1/2 weeks ago, beginning of June. She got up during the night to use the bathroom, she walked to the hallway and all of a sudden felt like her sugar was dropping and she became weak and ended up falling to the floor. She does not know if she hit anything, or if she passed out, she just remembers laying on the floor for some time and then crawled to the kitchen and with a help of a stool she had in there she was able to pull herself up. She was not hurting anywhere just some pain in the left arm. As the days went by she noticed her left elbow and lower arm down to her left hand was swelling and she had an abrasion on her left elbow. She started to put betadine and neosporin on this area. She has been leaving the area opened and tried not to get it wet. Pain is present in the left elbow, she has been able to use her left arm and hand and does not feel like it is broken. No fever that she knows of. She has not noticed any discharge coming from the wound.  Patient Active Problem List   Diagnosis Date Noted  . Syncopal episodes 12/15/2015  . Absolute anemia 04/14/2015  . Anxiety 04/14/2015  . Clinical depression 04/14/2015  . Essential (primary) hypertension 04/14/2015  . Gastro-esophageal reflux disease without esophagitis 04/14/2015  . Arthritis, degenerative 04/14/2015  . Allergic rhinitis 04/14/2015  . Type 2 diabetes mellitus ([place]) 04/14/2015  . Hyperlipidemia 02/27/2015  . Glaucoma 12/23/2014  . Diabetes ([place]) 12/23/2014  . GERD (gastroesophageal reflux disease) 12/23/2014  . Essential hypertension, malignant 05/24/2013  . SOB (shortness of breath) 05/24/2013  . Chronic diastolic heart failure ([place]) 05/24/2013    Past Medical History:  Diagnosis Date  . Acute heart failure ([place])   . Chronic low back pain   . Diabetes mellitus  without complication ([place])   . Hyperlipidemia   . Hypertension   . MI (myocardial infarction) ([place])   . Obesity     Social History   Social History  . Marital status: Widowed    Spouse name: N/A  . Number of children: N/A  . Years of education: N/A   Occupational History  . Not on file.   Social History Main Topics  . Smoking status: Former Smoker    Types: Cigarettes  . Smokeless tobacco: Never Used  . Alcohol use No  . Drug use: No  . Sexual activity: No   Other Topics Concern  . Not on file   Social History Narrative  . No narrative on file    Outpatient Encounter Prescriptions as of 10/19/2016  Medication Sig  . acetaminophen (TYLENOL) 500 MG tablet Take 1,000 mg by mouth every 6 (six) hours as needed.  [name] aspirin 81 MG EC tablet TAKE 1 TABLET (81 MG TOTAL) BY MOUTH DAILY.  [name] B-D UF III MINI PEN NEEDLES 31G X 5 MM MISC USE TWICE A DAY AS DIRECTED  . Bepotastine Besilate (BEPREVE) 1.5 % SOLN Place 1 drop into both eyes daily.  . brimonidine (ALPHAGAN) 0.2 % ophthalmic solution 3 (three) times daily.  . carvedilol (COREG) 12.5 MG tablet TAKE 1 TABLET BY MOUTH TWICE A DAY  . clonazePAM (KLONOPIN) 0.5 MG tablet TAKE 1 TABLET BY MOUTH TWICE A  DAY AS NEEDED  . furosemide (LASIX) 20 MG tablet TAKE 1 TABLET (20 MG TOTAL) BY MOUTH 2 (TWO) TIMES DAILY.  [name] Insulin Lispro Prot & Lispro (HUMALOG MIX 75/25 KWIKPEN) (75-25) 100 UNIT/ML Kwikpen Inject 60 Units into the skin 2 (two) times daily.  [name] latanoprost (XALATAN) 0.005 % ophthalmic solution Place 1 drop into the left eye at bedtime.   [name] loratadine (CLARITIN) 10 MG tablet Take 1 tablet (10 mg total) by mouth daily.  . quinapril (ACCUPRIL) 40 MG tablet TAKE 1 TABLET BY MOUTH EVERY DAY  . ranitidine (ZANTAC) 150 MG capsule Take 1 capsule (150 mg total) by mouth 2 (two) times daily.  . simvastatin (ZOCOR) 40 MG tablet TAKE 1 TABLET (40 MG TOTAL) BY MOUTH DAILY.  . traZODone (DESYREL) 150 MG tablet TAKE 1 TABLET BY MOUTH EVERY  NIGHT AT BEDTIME  . [DISCONTINUED] ranitidine (ZANTAC) 150 MG tablet TAKE 1 TABLET (150 MG TOTAL) BY MOUTH 2 (TWO) TIMES DAILY.   No facility-administered encounter medications on file as of 10/19/2016.     Allergies  Allergen Reactions  . Antihistamines, Chlorpheniramine-Type   . Codeine   . Paxil [Paroxetine]     "makes her feel funny" not in a good way    Review of Systems  Constitutional: Positive for malaise/fatigue.  Respiratory: Negative.   Cardiovascular: Negative.   Musculoskeletal: Positive for falls (1 fall) and joint pain.  Skin:       Abrasion left elbow, skin is scabbing around that area, crusty.  Neurological: Positive for weakness.    Objective:  BP (!) 165/62   Pulse 80   Temp 98.7 F (37.1 C)   Resp 14   Wt 158 lb (71.7 kg)   SpO2 95%   BMI 27.99 kg/m   Physical Exam  Constitutional: She is oriented to person, place, and time and well-developed, well-nourished, and in no distress.  HENT:  Head: Normocephalic and atraumatic.  Right Ear: External ear normal.  Left Ear: External ear normal.  Nose: Nose normal.  Eyes: Conjunctivae are normal. No scleral icterus.  Neck: No thyromegaly present.  Cardiovascular: Normal rate, regular rhythm, normal heart sounds and intact distal pulses.  Exam reveals no gallop.   No murmur heard. Pulmonary/Chest: Effort normal and breath sounds normal. No respiratory distress. She has no wheezes.  Abdominal: Soft.  Musculoskeletal:  Swelling of the olecranon on the left and left wrist with minimum tenderness, no warmth, abrasion of the left elbow.  Neurological: She is alert and oriented to person, place, and time.  Skin: Skin is warm.  Psychiatric: Mood, memory, affect and judgment normal.    Assessment and Plan :  1. Fall in home, initial encounter - DG Elbow Complete Left; Future - DG Wrist Complete Left; Future  2. Olecranon bursitis of left elbow Get xrays, start Augmentin, get blood work. Re check in 1 week.  May need referral to orthopedic for further work up if symptoms are not improving or getting worse.  - CBC with Differential/Platelet - Comprehensive metabolic panel - TSH - DG Elbow Complete Left; Future - DG Wrist Complete Left; Future  3. Cellulitis of left elbow - CBC with Differential/Platelet - Comprehensive metabolic panel - TSH - DG Elbow Complete Left; Future - DG Wrist Complete Left; Future  4. Abrasion, elbow with infection, left, initial encounter Td administered today. No record of this immunization in the chart before today.  5. Need for tetanus booster   HPI, Exam and A&P transcribed by Theressa Millard,  RMA under direction and in the presence of Miguel Aschoff, MD. I have done the exam and reviewed the chart and it is accurate to the best of my knowledge. Development worker, community has been used and  any errors in dictation or transcription are unintentional. Miguel Aschoff M.D. [facility]

## 2016-10-20 LAB — COMPREHENSIVE METABOLIC PANEL
ALBUMIN: 3.8 g/dL (ref 3.5–4.7)
ALT: 9 IU/L (ref 0–32)
AST: 14 IU/L (ref 0–40)
Albumin/Globulin Ratio: 1.9 (ref 1.2–2.2)
Alkaline Phosphatase: 98 IU/L (ref 39–117)
BUN / CREAT RATIO: 11 — AB (ref 12–28)
BUN: 30 mg/dL — AB (ref 8–27)
Bilirubin Total: <0.2 mg/dL (ref 0.0–1.2)
CO2: 23 mmol/L (ref 20–29)
Calcium: 9.1 mg/dL (ref 8.7–10.3)
Chloride: 106 mmol/L (ref 96–106)
Creatinine, Ser: 2.72 mg/dL — ABNORMAL HIGH (ref 0.57–1.00)
GFR, EST AFRICAN AMERICAN: 18 mL/min/{1.73_m2} — AB (ref >59)
GFR, EST NON AFRICAN AMERICAN: 16 mL/min/{1.73_m2} — AB (ref >59)
GLOBULIN, TOTAL: 2 g/dL (ref 1.5–4.5)
Glucose: 179 mg/dL — ABNORMAL HIGH (ref 65–99)
Potassium: 4.9 mmol/L (ref 3.5–5.2)
SODIUM: 143 mmol/L (ref 134–144)
TOTAL PROTEIN: 5.8 g/dL — AB (ref 6.0–8.5)

## 2016-10-20 LAB — CBC WITH DIFFERENTIAL/PLATELET
Basophils Absolute: 0 10*3/uL (ref 0.0–0.2)
Basos: 0 %
EOS (ABSOLUTE): 0.1 10*3/uL (ref 0.0–0.4)
EOS: 2 %
HEMATOCRIT: 33.2 % — AB (ref 34.0–46.6)
HEMOGLOBIN: 10.5 g/dL — AB (ref 11.1–15.9)
IMMATURE GRANS (ABS): 0 10*3/uL (ref 0.0–0.1)
Immature Granulocytes: 0 %
LYMPHS ABS: 1.2 10*3/uL (ref 0.7–3.1)
LYMPHS: 23 %
MCH: 26.9 pg (ref 26.6–33.0)
MCHC: 31.6 g/dL (ref 31.5–35.7)
MCV: 85 fL (ref 79–97)
MONOCYTES: 5 %
Monocytes Absolute: 0.3 10*3/uL (ref 0.1–0.9)
NEUTROS ABS: 3.6 10*3/uL (ref 1.4–7.0)
Neutrophils: 70 %
Platelets: 244 10*3/uL (ref 150–379)
RBC: 3.9 x10E6/uL (ref 3.77–5.28)
RDW: 15 % (ref 12.3–15.4)
WBC: 5.1 10*3/uL (ref 3.4–10.8)

## 2016-10-20 LAB — TSH: TSH: 2.3 u[IU]/mL (ref 0.450–4.500)

## 2016-10-25 ENCOUNTER — Telehealth: Payer: Self-pay

## 2016-10-25 DIAGNOSIS — N289 Disorder of kidney and ureter, unspecified: Secondary | ICD-10-CM

## 2016-10-25 NOTE — Telephone Encounter (Signed)
-----   Message from [place] sent at 10/21/2016 11:45 AM EDT ----- Line busy x 2 ED

## 2016-10-26 ENCOUNTER — Ambulatory Visit (INDEPENDENT_AMBULATORY_CARE_PROVIDER_SITE_OTHER): Payer: Medicare Other | Admitting: Family Medicine

## 2016-10-26 ENCOUNTER — Encounter: Payer: Self-pay | Admitting: Family Medicine

## 2016-10-26 VITALS — BP 168/80 | HR 88 | Temp 98.2°F | Resp 16

## 2016-10-26 DIAGNOSIS — N189 Chronic kidney disease, unspecified: Secondary | ICD-10-CM

## 2016-10-26 DIAGNOSIS — I10 Essential (primary) hypertension: Secondary | ICD-10-CM

## 2016-10-26 DIAGNOSIS — F3289 Other specified depressive episodes: Secondary | ICD-10-CM

## 2016-10-26 DIAGNOSIS — L03114 Cellulitis of left upper limb: Secondary | ICD-10-CM | POA: Diagnosis not present

## 2016-10-26 DIAGNOSIS — M7022 Olecranon bursitis, left elbow: Secondary | ICD-10-CM | POA: Diagnosis not present

## 2016-10-26 DIAGNOSIS — N289 Disorder of kidney and ureter, unspecified: Secondary | ICD-10-CM | POA: Diagnosis not present

## 2016-10-26 MED ORDER — AMLODIPINE BESYLATE 5 MG PO TABS: 5.0000 mg | ORAL_TABLET | Freq: Every day | ORAL | 3 refills | Status: DC

## 2016-10-26 NOTE — Patient Instructions (Signed)
Start amlodipine and follow up in August as scheduled.

## 2016-10-26 NOTE — Progress Notes (Signed)
Subjective:  HPI Pt is here for a 1 week follow up for Olecranon bursitis. She was treated with Augmentin. X-rays showed no fractures. Pt reports that her elbow is feeling better. It is still swollen but not as tender and wound on elbow looks like it is healing. She has not heard anything from the Nephrologist office about her appointment yet. Overall she feels better.  Prior to Admission medications   Medication Sig Start Date End Date Taking? Authorizing Provider  acetaminophen (TYLENOL) 500 MG tablet Take 1,000 mg by mouth every 6 (six) hours as needed.   Yes [provider]  aspirin 81 MG EC tablet TAKE 1 TABLET (81 MG TOTAL) BY MOUTH DAILY. 12/31/15  Yes Jerrol Banana., MD  Bepotastine Besilate (BEPREVE) 1.5 % SOLN Place 1 drop into both eyes daily.   Yes [provider]  brimonidine (ALPHAGAN) 0.2 % ophthalmic solution 3 (three) times daily.   Yes [provider]  carvedilol (COREG) 12.5 MG tablet TAKE 1 TABLET BY MOUTH TWICE A DAY 01/05/16  Yes Jerrol Banana., MD  clonazePAM (KLONOPIN) 0.5 MG tablet TAKE 1 TABLET BY MOUTH TWICE A DAY AS NEEDED 03/31/16  Yes Jerrol Banana., MD  furosemide (LASIX) 20 MG tablet TAKE 1 TABLET (20 MG TOTAL) BY MOUTH 2 (TWO) TIMES DAILY. 11/16/15  Yes Jerrol Banana., MD  Insulin Lispro Prot & Lispro (HUMALOG MIX 75/25 KWIKPEN) (75-25) 100 UNIT/ML Kwikpen Inject 60 Units into the skin 2 (two) times daily. 12/08/15  Yes Jerrol Banana., MD  latanoprost (XALATAN) 0.005 % ophthalmic solution Place 1 drop into the left eye at bedtime.  02/11/15  Yes [provider]  loratadine (CLARITIN) 10 MG tablet Take 1 tablet (10 mg total) by mouth daily. 12/15/14  Yes Jerrol Banana., MD  quinapril (ACCUPRIL) 40 MG tablet TAKE 1 TABLET BY MOUTH EVERY DAY 09/24/16  Yes Jerrol Banana., MD  ranitidine (ZANTAC) 150 MG capsule Take 1 capsule (150 mg total) by mouth 2 (two) times daily. 12/23/14  Yes  Jerrol Banana., MD  simvastatin (ZOCOR) 40 MG tablet TAKE 1 TABLET (40 MG TOTAL) BY MOUTH DAILY. 10/17/16  Yes Jerrol Banana., MD  traZODone (DESYREL) 150 MG tablet TAKE 1 TABLET BY MOUTH EVERY NIGHT AT BEDTIME 04/27/16  Yes Carmon Ginsberg, PA  amoxicillin-clavulanate (AUGMENTIN) 875-125 MG tablet Take 1 tablet by mouth 2 (two) times daily. Patient not taking: Reported on 10/26/2016 10/19/16   Jerrol Banana., MD  B-D UF III MINI PEN NEEDLES 31G X 5 MM MISC USE TWICE A DAY AS DIRECTED 12/31/15   Jerrol Banana., MD    Patient Active Problem List   Diagnosis Date Noted  . Syncopal episodes 12/15/2015  . Absolute anemia 04/14/2015  . Anxiety 04/14/2015  . Clinical depression 04/14/2015  . Essential (primary) hypertension 04/14/2015  . Gastro-esophageal reflux disease without esophagitis 04/14/2015  . Arthritis, degenerative 04/14/2015  . Allergic rhinitis 04/14/2015  . Type 2 diabetes mellitus ([place]) 04/14/2015  . Hyperlipidemia 02/27/2015  . Glaucoma 12/23/2014  . Diabetes ([place]) 12/23/2014  . GERD (gastroesophageal reflux disease) 12/23/2014  . Essential hypertension, malignant 05/24/2013  . SOB (shortness of breath) 05/24/2013  . Chronic diastolic heart failure ([place]) 05/24/2013    Past Medical History:  Diagnosis Date  . Acute heart failure ([place])   . Chronic low back pain   . Diabetes mellitus without complication ([place])   .  Hyperlipidemia   . Hypertension   . MI (myocardial infarction) ([place])   . Obesity     Social History   Social History  . Marital status: Widowed    Spouse name: N/A  . Number of children: N/A  . Years of education: N/A   Occupational History  . Not on file.   Social History Main Topics  . Smoking status: Former Smoker    Types: Cigarettes  . Smokeless tobacco: Never Used  . Alcohol use No  . Drug use: No  . Sexual activity: No   Other Topics Concern  . Not on file   Social History Narrative  . No narrative on  file    Allergies  Allergen Reactions  . Antihistamines, Chlorpheniramine-Type   . Codeine   . Paxil [Paroxetine]     "makes her feel funny" not in a good way    Review of Systems  Constitutional: Negative.   HENT: Negative.   Eyes: Negative.   Respiratory: Negative.   Cardiovascular: Negative.   Gastrointestinal: Negative.   Genitourinary: Negative.   Musculoskeletal: Positive for joint pain.  Skin: Negative.   Neurological: Negative.   Endo/Heme/Allergies: Negative.   Psychiatric/Behavioral: Negative.     Immunization History  Administered Date(s) Administered  . Influenza, High Dose Seasonal PF 02/05/2015, 01/16/2016  . Pneumococcal Conjugate-13 02/04/2014  . Pneumococcal Polysaccharide-23 04/07/2002  . Td 10/19/2016    Objective:  BP (!) 168/80 (BP Location: Right Arm, Patient Position: Sitting, Cuff Size: Normal)   Pulse 88   Temp 98.2 F (36.8 C) (Oral)   Resp 16   Physical Exam  Constitutional: She is oriented to person, place, and time and well-developed, well-nourished, and in no distress.  Eyes: Conjunctivae and EOM are normal. Pupils are equal, round, and reactive to light.  Neck: Normal range of motion. Neck supple.  Cardiovascular: Normal rate, regular rhythm, normal heart sounds and intact distal pulses.   Pulmonary/Chest: Effort normal and breath sounds normal.  Musculoskeletal: Normal range of motion. She exhibits edema (1+ on left and 2+ on right).  Neurological: She is alert and oriented to person, place, and time. She has normal reflexes. Gait normal. GCS score is 15.  Skin: Skin is warm and dry.  Psychiatric: Mood, memory, affect and judgment normal.    Lab Results  Component Value Date   WBC 5.1 10/19/2016   HGB 10.5 (L) 10/19/2016   HCT 33.2 (L) 10/19/2016   PLT 244 10/19/2016   GLUCOSE 179 (H) 10/19/2016   CHOL 132 12/15/2015   TRIG 224 (H) 12/15/2015   HDL 46 12/15/2015   LDLCALC 41 12/15/2015   TSH 2.300 10/19/2016   HGBA1C 9.1  08/22/2016    CMP     Component Value Date/Time   NA 143 10/19/2016 1221   NA 132 (L) 05/15/2013 0512   K 4.9 10/19/2016 1221   K 4.0 05/15/2013 0512   CL 106 10/19/2016 1221   CL 99 05/15/2013 0512   CO2 23 10/19/2016 1221   CO2 28 05/15/2013 0512   GLUCOSE 179 (H) 10/19/2016 1221   GLUCOSE 95 05/15/2013 0512   BUN 30 (H) 10/19/2016 1221   BUN 80 (H) 05/15/2013 0512   CREATININE 2.72 (H) 10/19/2016 1221   CREATININE 2.27 (H) 05/15/2013 0512   CALCIUM 9.1 10/19/2016 1221   CALCIUM 8.0 (L) 05/15/2013 0512   PROT 5.8 (L) 10/19/2016 1221   PROT 7.7 05/11/2013 2221   ALBUMIN 3.8 10/19/2016 1221   ALBUMIN 3.6 05/11/2013 2221  AST 14 10/19/2016 1221   AST 46 (H) 05/11/2013 2221   ALT 9 10/19/2016 1221   ALT 33 05/11/2013 2221   ALKPHOS 98 10/19/2016 1221   ALKPHOS 107 05/11/2013 2221   BILITOT <0.2 10/19/2016 1221   BILITOT 0.2 05/11/2013 2221   GFRNONAA 16 (L) 10/19/2016 1221   GFRNONAA 20 (L) 05/15/2013 0512   GFRAA 18 (L) 10/19/2016 1221   GFRAA 23 (L) 05/15/2013 0512    Assessment and Plan :  1. Olecranon bursitis of left elbow Much improved.   2. Cellulitis of left elbow Resolving.  3. Function kidney decreased   4. Chronic kidney disease, unspecified CKD stage Refer to nephrology. - Protein electrophoresis - Renal function panel  6. Essential hypertension, malignant Start amlodipine and follow up in August as scheduled.  - amLODipine (NORVASC) 5 MG tablet; Take 1 tablet (5 mg total) by mouth daily.  Dispense: 90 tablet; Refill: 3  HPI, Exam, and A&P Transcribed under the direction and in the presence of Ailynn Gow L. [place] Mon, MD  Electronically Signed: Katina Dung, [place] MD [facility] 10/26/2016 2:12 PM

## 2016-10-28 LAB — PROTEIN ELECTROPHORESIS
A/G Ratio: 1.3 (ref 0.7–1.7)
ALPHA 1: 0.3 g/dL (ref 0.0–0.4)
Albumin ELP: 3.3 g/dL (ref 2.9–4.4)
Alpha 2: 0.7 g/dL (ref 0.4–1.0)
Beta: 0.8 g/dL (ref 0.7–1.3)
GAMMA GLOBULIN: 0.7 g/dL (ref 0.4–1.8)
Globulin, Total: 2.5 g/dL (ref 2.2–3.9)
Total Protein: 5.8 g/dL — ABNORMAL LOW (ref 6.0–8.5)

## 2016-10-28 LAB — RENAL FUNCTION PANEL
ALBUMIN: 3.7 g/dL (ref 3.5–4.7)
BUN/Creatinine Ratio: 12 (ref 12–28)
BUN: 28 mg/dL — AB (ref 8–27)
CHLORIDE: 104 mmol/L (ref 96–106)
CO2: 23 mmol/L (ref 20–29)
Calcium: 9.5 mg/dL (ref 8.7–10.3)
Creatinine, Ser: 2.3 mg/dL — ABNORMAL HIGH (ref 0.57–1.00)
GFR, EST AFRICAN AMERICAN: 22 mL/min/{1.73_m2} — AB (ref >59)
GFR, EST NON AFRICAN AMERICAN: 19 mL/min/{1.73_m2} — AB (ref >59)
GLUCOSE: 197 mg/dL — AB (ref 65–99)
PHOSPHORUS: 3.3 mg/dL (ref 2.5–4.5)
POTASSIUM: 4.6 mmol/L (ref 3.5–5.2)
Sodium: 142 mmol/L (ref 134–144)

## 2016-11-12 DIAGNOSIS — R0602 Shortness of breath: Secondary | ICD-10-CM | POA: Diagnosis not present

## 2016-11-18 ENCOUNTER — Telehealth: Payer: Self-pay

## 2016-11-18 NOTE — Telephone Encounter (Signed)
Tried to call pharmacy. No answer and could not leave message.

## 2016-11-18 NOTE — Telephone Encounter (Signed)
Erasmo Downer from [place] states that pt contacted their pharmacy for a new prescription of diclofenac solution. She states pt is requesting this for knee OA. Would you agree to fill this?

## 2016-11-18 NOTE — Telephone Encounter (Signed)
Too dangerous for kidneys.

## 2016-11-21 NOTE — Telephone Encounter (Signed)
Received fax from pharmacy and sent it back to them letting them know this request denied and the reason as below-aa

## 2016-12-09 DIAGNOSIS — B351 Tinea unguium: Secondary | ICD-10-CM | POA: Diagnosis not present

## 2016-12-09 DIAGNOSIS — E119 Type 2 diabetes mellitus without complications: Secondary | ICD-10-CM | POA: Diagnosis not present

## 2016-12-11 ENCOUNTER — Other Ambulatory Visit: Payer: Self-pay | Admitting: Family Medicine

## 2016-12-11 DIAGNOSIS — Z794 Long term (current) use of insulin: Principal | ICD-10-CM

## 2016-12-11 DIAGNOSIS — E118 Type 2 diabetes mellitus with unspecified complications: Secondary | ICD-10-CM

## 2016-12-13 DIAGNOSIS — R0602 Shortness of breath: Secondary | ICD-10-CM | POA: Diagnosis not present

## 2016-12-19 ENCOUNTER — Ambulatory Visit: Payer: Medicare Other | Admitting: Family Medicine

## 2016-12-26 ENCOUNTER — Ambulatory Visit (INDEPENDENT_AMBULATORY_CARE_PROVIDER_SITE_OTHER): Payer: Medicare Other | Admitting: Family Medicine

## 2016-12-26 VITALS — BP 144/66 | HR 78 | Temp 98.4°F | Resp 16 | Wt 152.0 lb

## 2016-12-26 DIAGNOSIS — D649 Anemia, unspecified: Secondary | ICD-10-CM | POA: Diagnosis not present

## 2016-12-26 DIAGNOSIS — Z23 Encounter for immunization: Secondary | ICD-10-CM

## 2016-12-26 DIAGNOSIS — I120 Hypertensive chronic kidney disease with stage 5 chronic kidney disease or end stage renal disease: Secondary | ICD-10-CM | POA: Diagnosis not present

## 2016-12-26 DIAGNOSIS — N185 Chronic kidney disease, stage 5: Secondary | ICD-10-CM

## 2016-12-26 DIAGNOSIS — I10 Essential (primary) hypertension: Secondary | ICD-10-CM | POA: Diagnosis not present

## 2016-12-26 DIAGNOSIS — E785 Hyperlipidemia, unspecified: Secondary | ICD-10-CM

## 2016-12-26 DIAGNOSIS — E1121 Type 2 diabetes mellitus with diabetic nephropathy: Secondary | ICD-10-CM

## 2016-12-26 NOTE — Progress Notes (Signed)
Lori Stanley  MRN: [id number] DOB: 06/23/1931  Subjective:  HPI  The patient is an 83 year old female who presents for follow up of her diabetes and hypertension.  The patient was last seen on 10/26/16.  Hypertension- On her last visit her blood pressure was 168/80.  She was started on Amlodipine and instructed to return tot he office for follow up.  She does not check her BP outsider of the office.  She reports that when she was taking the Amlodipine it made her dizzy and feel bad.  She stopped taking it and got to feeling better.  Diabetes- Her last A1C was 9.1 on 08/22/16.  The patient reports her readings at home have from from 110's-180's. She denies any hypoglycemic readings.  The patient also reports that since her last visit she has fallen twice.  She reports no injuries but relates the falls to getting out of bed during the night, needing to urinate and not having time to stand and wait for the dizziness to go away.  We have discussed the possibility of her having a walker at her bedside to help aid in this situation.  She would like to get one if she is able.  The patient is due for her thyroid and cholesterol check as well as her A1C.  Patient Active Problem List   Diagnosis Date Noted  . Syncopal episodes 12/15/2015  . Absolute anemia 04/14/2015  . Anxiety 04/14/2015  . Clinical depression 04/14/2015  . Essential (primary) hypertension 04/14/2015  . Gastro-esophageal reflux disease without esophagitis 04/14/2015  . Arthritis, degenerative 04/14/2015  . Allergic rhinitis 04/14/2015  . Type 2 diabetes mellitus ([place]) 04/14/2015  . Hyperlipidemia 02/27/2015  . Glaucoma 12/23/2014  . Diabetes ([place]) 12/23/2014  . GERD (gastroesophageal reflux disease) 12/23/2014  . Essential hypertension, malignant 05/24/2013  . SOB (shortness of breath) 05/24/2013  . Chronic diastolic heart failure ([place]) 05/24/2013    Past Medical History:  Diagnosis Date  . Acute heart failure  ([place])   . Chronic low back pain   . Diabetes mellitus without complication ([place])   . Hyperlipidemia   . Hypertension   . MI (myocardial infarction) ([place])   . Obesity     Social History   Social History  . Marital status: Widowed    Spouse name: N/A  . Number of children: N/A  . Years of education: N/A   Occupational History  . Not on file.   Social History Main Topics  . Smoking status: Former Smoker    Types: Cigarettes  . Smokeless tobacco: Never Used  . Alcohol use No  . Drug use: No  . Sexual activity: No   Other Topics Concern  . Not on file   Social History Narrative  . No narrative on file    Outpatient Encounter Prescriptions as of 12/26/2016  Medication Sig  . acetaminophen (TYLENOL) 500 MG tablet Take 1,000 mg by mouth every 6 (six) hours as needed.  [name] amLODipine (NORVASC) 5 MG tablet Take 1 tablet (5 mg total) by mouth daily.  [name] amoxicillin-clavulanate (AUGMENTIN) 875-125 MG tablet Take 1 tablet by mouth 2 (two) times daily. (Patient not taking: Reported on 10/26/2016)  . aspirin 81 MG EC tablet TAKE 1 TABLET (81 MG TOTAL) BY MOUTH DAILY.  [name] B-D UF III MINI PEN NEEDLES 31G X 5 MM MISC USE TWICE A DAY AS DIRECTED  . Bepotastine Besilate (BEPREVE) 1.5 % SOLN Place 1 drop into both eyes daily.  . brimonidine (  ALPHAGAN) 0.2 % ophthalmic solution 3 (three) times daily.  . carvedilol (COREG) 12.5 MG tablet TAKE 1 TABLET BY MOUTH TWICE A DAY  . clonazePAM (KLONOPIN) 0.5 MG tablet TAKE 1 TABLET BY MOUTH TWICE A DAY AS NEEDED  . furosemide (LASIX) 20 MG tablet TAKE 1 TABLET (20 MG TOTAL) BY MOUTH 2 (TWO) TIMES DAILY.  [name] HUMALOG MIX 75/25 KWIKPEN (75-25) 100 UNIT/ML Kwikpen INJECT 60 UNITS INTO THE SKIN 2 (TWO) TIMES DAILY.  [name] latanoprost (XALATAN) 0.005 % ophthalmic solution Place 1 drop into the left eye at bedtime.   [name] loratadine (CLARITIN) 10 MG tablet Take 1 tablet (10 mg total) by mouth daily.  . quinapril (ACCUPRIL) 40 MG tablet TAKE 1 TABLET BY MOUTH EVERY DAY   . ranitidine (ZANTAC) 150 MG capsule Take 1 capsule (150 mg total) by mouth 2 (two) times daily.  . simvastatin (ZOCOR) 40 MG tablet TAKE 1 TABLET (40 MG TOTAL) BY MOUTH DAILY.  . traZODone (DESYREL) 150 MG tablet TAKE 1 TABLET BY MOUTH EVERY NIGHT AT BEDTIME   No facility-administered encounter medications on file as of 12/26/2016.     Allergies  Allergen Reactions  . Antihistamines, Chlorpheniramine-Type   . Codeine   . Paxil [Paroxetine]     "makes her feel funny" not in a good way    Review of Systems  Constitutional: Positive for malaise/fatigue. Negative for fever.  HENT: Negative.   Eyes: Negative.   Respiratory: Negative for cough, shortness of breath and wheezing.   Cardiovascular: Negative for chest pain, palpitations and orthopnea.  Gastrointestinal: Negative.   Musculoskeletal: Positive for joint pain.  Skin: Negative.   Neurological: Positive for dizziness and weakness (mostly because of bad knees).  Endo/Heme/Allergies: Negative.   Psychiatric/Behavioral: The patient is nervous/anxious.      Fall Risk  12/26/2016 10/19/2016 08/22/2016 04/14/2015  Falls in the past year? Yes Yes No No  Number falls in past yr: 2 or more 1 - -  Injury with Fall? No Yes - -  Risk for fall due to : History of fall(s);Impaired balance/gait Impaired balance/gait - -  Follow up - Falls evaluation completed;Falls prevention discussed;Follow up appointment - -    Objective:  BP (!) 144/66 (BP Location: Right Arm, Patient Position: Sitting, Cuff Size: Normal)   Pulse 78   Temp 98.4 F (36.9 C) (Oral)   Resp 16   Wt 152 lb (68.9 kg)   BMI 26.93 kg/m   Physical Exam  Constitutional: She is oriented to person, place, and time and well-developed, well-nourished, and in no distress.  Pleasant BF NAD  HENT:  Head: Normocephalic and atraumatic.  Right Ear: External ear normal.  Left Ear: External ear normal.  Nose: Nose normal.  Eyes: Conjunctivae are normal. No scleral icterus.    Neck: No thyromegaly present.  Cardiovascular: Normal rate, regular rhythm and normal heart sounds.   Pulmonary/Chest: Effort normal and breath sounds normal.  Abdominal: Soft. Bowel sounds are normal.  Musculoskeletal: She exhibits edema.  Walks with cane. 1+ edema.  Neurological: She is alert and oriented to person, place, and time. Gait normal. GCS score is 15.  Skin: Skin is warm and dry.  Psychiatric: Mood, memory, affect and judgment normal.    Assessment and Plan :  TIIDM HTN CKD Patient reluctantly has stopped NSAID for arthritis. I think she needs to see nephrology but have negotiated with her that if her renal function stabilizes she can hold off. If it worsens she will need to see  them. She is close to [facility] [address]. OA MDD/GAD  I have done the exam and reviewed the chart and it is accurate to the best of my knowledge. Development worker, community has been used and  any errors in dictation or transcription are unintentional. Miguel Aschoff M.D. [facility]

## 2016-12-27 LAB — COMPREHENSIVE METABOLIC PANEL
ALBUMIN: 4.1 g/dL (ref 3.5–4.7)
ALK PHOS: 96 IU/L (ref 39–117)
ALT: 9 IU/L (ref 0–32)
AST: 16 IU/L (ref 0–40)
Albumin/Globulin Ratio: 1.6 (ref 1.2–2.2)
BUN / CREAT RATIO: 13 (ref 12–28)
BUN: 35 mg/dL — AB (ref 8–27)
CHLORIDE: 105 mmol/L (ref 96–106)
CO2: 24 mmol/L (ref 20–29)
Calcium: 9.1 mg/dL (ref 8.7–10.3)
Creatinine, Ser: 2.63 mg/dL — ABNORMAL HIGH (ref 0.57–1.00)
GFR calc Af Amer: 19 mL/min/{1.73_m2} — ABNORMAL LOW (ref >59)
GFR calc non Af Amer: 16 mL/min/{1.73_m2} — ABNORMAL LOW (ref >59)
GLUCOSE: 179 mg/dL — AB (ref 65–99)
Globulin, Total: 2.5 g/dL (ref 1.5–4.5)
Potassium: 4.9 mmol/L (ref 3.5–5.2)
Sodium: 143 mmol/L (ref 134–144)
Total Protein: 6.6 g/dL (ref 6.0–8.5)

## 2016-12-27 LAB — LIPID PANEL WITH LDL/HDL RATIO
CHOLESTEROL TOTAL: 138 mg/dL (ref 100–199)
HDL: 44 mg/dL (ref >39)
LDL Calculated: 64 mg/dL (ref 0–99)
LDl/HDL Ratio: 1.5 ratio (ref 0.0–3.2)
TRIGLYCERIDES: 149 mg/dL (ref 0–149)
VLDL CHOLESTEROL CAL: 30 mg/dL (ref 5–40)

## 2016-12-27 LAB — CBC WITH DIFFERENTIAL/PLATELET
BASOS ABS: 0 10*3/uL (ref 0.0–0.2)
Basos: 0 %
EOS (ABSOLUTE): 0.1 10*3/uL (ref 0.0–0.4)
Eos: 1 %
Hematocrit: 35.2 % (ref 34.0–46.6)
Hemoglobin: 10.9 g/dL — ABNORMAL LOW (ref 11.1–15.9)
Immature Grans (Abs): 0 10*3/uL (ref 0.0–0.1)
Immature Granulocytes: 0 %
LYMPHS ABS: 1.2 10*3/uL (ref 0.7–3.1)
Lymphs: 21 %
MCH: 26.7 pg (ref 26.6–33.0)
MCHC: 31 g/dL — AB (ref 31.5–35.7)
MCV: 86 fL (ref 79–97)
MONOS ABS: 0.2 10*3/uL (ref 0.1–0.9)
Monocytes: 4 %
NEUTROS ABS: 4.3 10*3/uL (ref 1.4–7.0)
Neutrophils: 74 %
PLATELETS: 245 10*3/uL (ref 150–379)
RBC: 4.09 x10E6/uL (ref 3.77–5.28)
RDW: 14.5 % (ref 12.3–15.4)
WBC: 5.8 10*3/uL (ref 3.4–10.8)

## 2016-12-27 LAB — TSH: TSH: 2.05 u[IU]/mL (ref 0.450–4.500)

## 2016-12-27 LAB — HEMOGLOBIN A1C
ESTIMATED AVERAGE GLUCOSE: 177 mg/dL
HEMOGLOBIN A1C: 7.8 % — AB (ref 4.8–5.6)

## 2017-01-03 ENCOUNTER — Ambulatory Visit: Payer: Medicare Other

## 2017-01-08 ENCOUNTER — Other Ambulatory Visit: Payer: Self-pay | Admitting: Family Medicine

## 2017-01-13 DIAGNOSIS — R0602 Shortness of breath: Secondary | ICD-10-CM | POA: Diagnosis not present

## 2017-01-17 ENCOUNTER — Telehealth: Payer: Self-pay | Admitting: Family Medicine

## 2017-01-17 ENCOUNTER — Ambulatory Visit (INDEPENDENT_AMBULATORY_CARE_PROVIDER_SITE_OTHER): Payer: Medicare Other | Admitting: Family Medicine

## 2017-01-17 VITALS — BP 198/82 | HR 96 | Temp 98.2°F | Resp 14 | Wt 152.0 lb

## 2017-01-17 DIAGNOSIS — L0231 Cutaneous abscess of buttock: Secondary | ICD-10-CM | POA: Diagnosis not present

## 2017-01-17 DIAGNOSIS — L03317 Cellulitis of buttock: Secondary | ICD-10-CM

## 2017-01-17 MED ORDER — DORZOLAMIDE HCL 2 % OP SOLN: 1.0000 [drp] | Freq: Three times a day (TID) | OPHTHALMIC | 0 refills | Status: DC

## 2017-01-17 MED ORDER — AMOXICILLIN-POT CLAVULANATE 875-125 MG PO TABS: 1.0000 | ORAL_TABLET | Freq: Two times a day (BID) | ORAL | 0 refills | Status: DC

## 2017-01-17 NOTE — Patient Instructions (Addendum)
Use sitz bath or hot wash cloth to the infected area 3-4 times daily.  Return to office on Thursday at 10:30.  In case of certain infection, clean all linen and clothing after single use.  Pick up prescription at pharmacy and start today.  Take after eating.

## 2017-01-17 NOTE — Telephone Encounter (Signed)
Pt called with the names of three eye drops that she currently uses.   She said to give these to Farrel Gordon Dorzolamide  Latanoprost  She uses [facility]

## 2017-01-17 NOTE — Progress Notes (Signed)
Lori Stanley  MRN: [id number] DOB: [birth year]  Subjective:  HPI   The patient is an 83 year old female who presents for evaluation of a boil that has developed on her right buttocks.  She states she felt something starting there right after her last visit here on 12/26/16.  Since then she said it has gotten bigger and last night she noticed it was draining a little. She states that at one point she was unable to sit comfortably.  Patient Active Problem List   Diagnosis Date Noted  . Syncopal episodes 12/15/2015  . Absolute anemia 04/14/2015  . Anxiety 04/14/2015  . Clinical depression 04/14/2015  . Essential (primary) hypertension 04/14/2015  . Gastro-esophageal reflux disease without esophagitis 04/14/2015  . Arthritis, degenerative 04/14/2015  . Allergic rhinitis 04/14/2015  . Type 2 diabetes mellitus ([place]) 04/14/2015  . Hyperlipidemia 02/27/2015  . Glaucoma 12/23/2014  . Diabetes ([place]) 12/23/2014  . GERD (gastroesophageal reflux disease) 12/23/2014  . Essential hypertension, malignant 05/24/2013  . SOB (shortness of breath) 05/24/2013  . Chronic diastolic heart failure ([place]) 05/24/2013    Past Medical History:  Diagnosis Date  . Acute heart failure ([place])   . Chronic low back pain   . Diabetes mellitus without complication ([place])   . Hyperlipidemia   . Hypertension   . MI (myocardial infarction) ([place])   . Obesity     Social History   Social History  . Marital status: Widowed    Spouse name: N/A  . Number of children: N/A  . Years of education: N/A   Occupational History  . Not on file.   Social History Main Topics  . Smoking status: Former Smoker    Types: Cigarettes  . Smokeless tobacco: Never Used  . Alcohol use No  . Drug use: No  . Sexual activity: No   Other Topics Concern  . Not on file   Social History Narrative  . No narrative on file    Outpatient Encounter Prescriptions as of 01/17/2017  Medication Sig  . acetaminophen (TYLENOL) 500  MG tablet Take 1,000 mg by mouth every 6 (six) hours as needed.  [name] amLODipine (NORVASC) 5 MG tablet Take 1 tablet (5 mg total) by mouth daily.  [name] aspirin 81 MG EC tablet TAKE 1 TABLET (81 MG TOTAL) BY MOUTH DAILY.  [name] B-D UF III MINI PEN NEEDLES 31G X 5 MM MISC USE TWICE A DAY AS DIRECTED  . Bepotastine Besilate (BEPREVE) 1.5 % SOLN Place 1 drop into both eyes daily.  . carvedilol (COREG) 12.5 MG tablet TAKE 1 TABLET BY MOUTH TWICE A DAY  . clonazePAM (KLONOPIN) 0.5 MG tablet TAKE 1 TABLET BY MOUTH TWICE A DAY AS NEEDED  . furosemide (LASIX) 20 MG tablet TAKE 1 TABLET (20 MG TOTAL) BY MOUTH 2 (TWO) TIMES DAILY.  [name] HUMALOG MIX 75/25 KWIKPEN (75-25) 100 UNIT/ML Kwikpen INJECT 60 UNITS INTO THE SKIN 2 (TWO) TIMES DAILY.  [name] latanoprost (XALATAN) 0.005 % ophthalmic solution Place 1 drop into the left eye at bedtime.   [name] loratadine (CLARITIN) 10 MG tablet Take 1 tablet (10 mg total) by mouth daily.  . quinapril (ACCUPRIL) 40 MG tablet TAKE 1 TABLET BY MOUTH EVERY DAY  . ranitidine (ZANTAC) 150 MG capsule Take 1 capsule (150 mg total) by mouth 2 (two) times daily.  . simvastatin (ZOCOR) 40 MG tablet TAKE 1 TABLET (40 MG TOTAL) BY MOUTH DAILY.  . traZODone (DESYREL) 150 MG tablet TAKE 1 TABLET BY MOUTH EVERY  NIGHT AT BEDTIME  . [DISCONTINUED] amoxicillin-clavulanate (AUGMENTIN) 875-125 MG tablet Take 1 tablet by mouth 2 (two) times daily. (Patient not taking: Reported on 10/26/2016)  . [DISCONTINUED] brimonidine (ALPHAGAN) 0.2 % ophthalmic solution 3 (three) times daily.   No facility-administered encounter medications on file as of 01/17/2017.     Allergies  Allergen Reactions  . Antihistamines, Chlorpheniramine-Type   . Codeine   . Paxil [Paroxetine]     "makes her feel funny" not in a good way    Review of Systems  Constitutional: Negative for fever and malaise/fatigue.  Respiratory: Negative for cough, shortness of breath and wheezing.   Cardiovascular: Positive for leg swelling (ankles).  Negative for chest pain, palpitations and orthopnea.  Skin: Negative for itching and rash.  Neurological: Negative for weakness.    Objective:  BP (!) 198/82 (BP Location: Right Arm, Patient Position: Sitting, Cuff Size: Normal)   Pulse 96   Temp 98.2 F (36.8 C) (Oral)   Resp 14   Wt 152 lb (68.9 kg)   BMI 26.93 kg/m   Physical Exam  Constitutional: She is oriented to person, place, and time and well-developed, well-nourished, and in no distress.  HENT:  Head: Normocephalic and atraumatic.  Cardiovascular: Normal rate and regular rhythm.   Pulmonary/Chest: Effort normal.  Abdominal: Soft.  Neurological: She is alert and oriented to person, place, and time.  Skin: Skin is warm and dry.  Left perirectal swelling mainly away from anal verge.Tender and indurated.  Psychiatric: Mood, memory, affect and judgment normal.    Assessment and Plan :  Perirectal Abscess/with cellulitis Area prepped with Betadine and anesthesia with lidocaine with epinephrine and a one-inch incision is made. Cover with Augmentin, use sitz baths. Return to clinic 2 days for reassessment  I have done the exam and reviewed the chart and it is accurate to the best of my knowledge. Development worker, community has been used and  any errors in dictation or transcription are unintentional. Miguel Aschoff M.D. [facility]

## 2017-01-17 NOTE — Telephone Encounter (Signed)
Updated medication list-Matej Sappenfield V Jasie Meleski, RMA

## 2017-01-19 ENCOUNTER — Ambulatory Visit: Payer: Medicare Other | Admitting: Family Medicine

## 2017-01-19 VITALS — BP 170/64 | HR 84 | Temp 98.7°F | Resp 14

## 2017-01-19 DIAGNOSIS — K611 Rectal abscess: Secondary | ICD-10-CM

## 2017-01-19 NOTE — Progress Notes (Signed)
Lori Stanley  MRN: [id number] DOB: Aug 01, 1931  Subjective:  HPI   The patient is an 83 year female who presents for follow up of her abscess/cellulitis.  She was seen 2 days ago and at that time she had it incised.  She was instructed to do sitz baths and she states she has.  She reports that it is still draining a little bloody drainage.  However she said it is smaller and feels much better.  Patient Active Problem List   Diagnosis Date Noted  . Syncopal episodes 12/15/2015  . Absolute anemia 04/14/2015  . Anxiety 04/14/2015  . Clinical depression 04/14/2015  . Essential (primary) hypertension 04/14/2015  . Gastro-esophageal reflux disease without esophagitis 04/14/2015  . Arthritis, degenerative 04/14/2015  . Allergic rhinitis 04/14/2015  . Type 2 diabetes mellitus ([place]) 04/14/2015  . Hyperlipidemia 02/27/2015  . Glaucoma 12/23/2014  . Diabetes ([place]) 12/23/2014  . GERD (gastroesophageal reflux disease) 12/23/2014  . Essential hypertension, malignant 05/24/2013  . SOB (shortness of breath) 05/24/2013  . Chronic diastolic heart failure ([place]) 05/24/2013    Past Medical History:  Diagnosis Date  . Acute heart failure ([place])   . Chronic low back pain   . Diabetes mellitus without complication ([place])   . Hyperlipidemia   . Hypertension   . MI (myocardial infarction) ([place])   . Obesity     Social History   Social History  . Marital status: Widowed    Spouse name: N/A  . Number of children: N/A  . Years of education: N/A   Occupational History  . Not on file.   Social History Main Topics  . Smoking status: Former Smoker    Types: Cigarettes  . Smokeless tobacco: Never Used  . Alcohol use No  . Drug use: No  . Sexual activity: No   Other Topics Concern  . Not on file   Social History Narrative  . No narrative on file    Outpatient Encounter Prescriptions as of 01/19/2017  Medication Sig  . acetaminophen (TYLENOL) 500 MG tablet Take 1,000 mg by mouth  every 6 (six) hours as needed.  [name] amLODipine (NORVASC) 5 MG tablet Take 1 tablet (5 mg total) by mouth daily.  [name] amoxicillin-clavulanate (AUGMENTIN) 875-125 MG tablet Take 1 tablet by mouth 2 (two) times daily.  [name] aspirin 81 MG EC tablet TAKE 1 TABLET (81 MG TOTAL) BY MOUTH DAILY.  [name] B-D UF III MINI PEN NEEDLES 31G X 5 MM MISC USE TWICE A DAY AS DIRECTED  . Bepotastine Besilate (BEPREVE) 1.5 % SOLN Place 1 drop into both eyes daily.  . carvedilol (COREG) 12.5 MG tablet TAKE 1 TABLET BY MOUTH TWICE A DAY  . clonazePAM (KLONOPIN) 0.5 MG tablet TAKE 1 TABLET BY MOUTH TWICE A DAY AS NEEDED  . dorzolamide (TRUSOPT) 2 % ophthalmic solution Place 1 drop into both eyes 3 (three) times daily.  . furosemide (LASIX) 20 MG tablet TAKE 1 TABLET (20 MG TOTAL) BY MOUTH 2 (TWO) TIMES DAILY.  [name] HUMALOG MIX 75/25 KWIKPEN (75-25) 100 UNIT/ML Kwikpen INJECT 60 UNITS INTO THE SKIN 2 (TWO) TIMES DAILY.  [name] latanoprost (XALATAN) 0.005 % ophthalmic solution Place 1 drop into the left eye at bedtime.   [name] loratadine (CLARITIN) 10 MG tablet Take 1 tablet (10 mg total) by mouth daily.  . quinapril (ACCUPRIL) 40 MG tablet TAKE 1 TABLET BY MOUTH EVERY DAY  . ranitidine (ZANTAC) 150 MG capsule Take 1 capsule (150 mg total) by mouth 2 (two)  times daily.  . simvastatin (ZOCOR) 40 MG tablet TAKE 1 TABLET (40 MG TOTAL) BY MOUTH DAILY.  . traZODone (DESYREL) 150 MG tablet TAKE 1 TABLET BY MOUTH EVERY NIGHT AT BEDTIME   No facility-administered encounter medications on file as of 01/19/2017.     Allergies  Allergen Reactions  . Antihistamines, Chlorpheniramine-Type   . Codeine   . Paxil [Paroxetine]     "makes her feel funny" not in a good way    Review of Systems  Constitutional: Negative for fever.  Respiratory: Negative for cough and shortness of breath.   Cardiovascular: Negative for chest pain and palpitations.  Gastrointestinal: Negative.   Genitourinary: Negative.   Psychiatric/Behavioral: Negative.       Objective:  BP (!) 170/64 (BP Location: Right Arm, Patient Position: Sitting, Cuff Size: Normal)   Pulse 84   Temp 98.7 F (37.1 C) (Oral)   Resp 14   Physical Exam  Constitutional: She is well-developed, well-nourished, and in no distress.  HENT:  Head: Normocephalic and atraumatic.  Cardiovascular: Normal rate and regular rhythm.   Pulmonary/Chest: Effort normal.  Abdominal: Soft.  Skin: Skin is warm and dry.  Perirectal abscess is much improved. Much less swelling tenderness and induration.  Psychiatric: Mood, memory, affect and judgment normal.    Assessment and Plan :  Perirectal Abscess/cellulitis Improving nicely. Finish antibiotics and continue sitz baths. Follow-up when necessary.  I have done the exam and reviewed the chart and it is accurate to the best of my knowledge. Development worker, community has been used and  any errors in dictation or transcription are unintentional. Miguel Aschoff M.D. [facility]

## 2017-02-01 ENCOUNTER — Other Ambulatory Visit: Payer: Self-pay | Admitting: Family Medicine

## 2017-02-12 DIAGNOSIS — R0602 Shortness of breath: Secondary | ICD-10-CM | POA: Diagnosis not present

## 2017-02-14 ENCOUNTER — Other Ambulatory Visit: Payer: Self-pay | Admitting: Family Medicine

## 2017-02-15 DIAGNOSIS — H401132 Primary open-angle glaucoma, bilateral, moderate stage: Secondary | ICD-10-CM | POA: Diagnosis not present

## 2017-03-01 DIAGNOSIS — R319 Hematuria, unspecified: Secondary | ICD-10-CM | POA: Diagnosis not present

## 2017-03-01 DIAGNOSIS — E1122 Type 2 diabetes mellitus with diabetic chronic kidney disease: Secondary | ICD-10-CM | POA: Diagnosis not present

## 2017-03-01 DIAGNOSIS — N183 Chronic kidney disease, stage 3 (moderate): Secondary | ICD-10-CM | POA: Diagnosis not present

## 2017-03-01 DIAGNOSIS — I129 Hypertensive chronic kidney disease with stage 1 through stage 4 chronic kidney disease, or unspecified chronic kidney disease: Secondary | ICD-10-CM | POA: Diagnosis not present

## 2017-03-01 DIAGNOSIS — N179 Acute kidney failure, unspecified: Secondary | ICD-10-CM | POA: Diagnosis not present

## 2017-03-01 DIAGNOSIS — R809 Proteinuria, unspecified: Secondary | ICD-10-CM | POA: Diagnosis not present

## 2017-03-13 DIAGNOSIS — B351 Tinea unguium: Secondary | ICD-10-CM | POA: Diagnosis not present

## 2017-03-13 DIAGNOSIS — E119 Type 2 diabetes mellitus without complications: Secondary | ICD-10-CM | POA: Diagnosis not present

## 2017-03-15 DIAGNOSIS — R0602 Shortness of breath: Secondary | ICD-10-CM | POA: Diagnosis not present

## 2017-03-28 ENCOUNTER — Other Ambulatory Visit: Payer: Self-pay | Admitting: Nephrology

## 2017-03-28 DIAGNOSIS — N179 Acute kidney failure, unspecified: Secondary | ICD-10-CM

## 2017-03-28 DIAGNOSIS — N183 Chronic kidney disease, stage 3 unspecified: Secondary | ICD-10-CM

## 2017-03-28 DIAGNOSIS — R319 Hematuria, unspecified: Secondary | ICD-10-CM

## 2017-03-28 DIAGNOSIS — R809 Proteinuria, unspecified: Secondary | ICD-10-CM

## 2017-03-30 ENCOUNTER — Ambulatory Visit
Admission: RE | Admit: 2017-03-30 | Discharge: 2017-03-30 | Disposition: A | Discharge status: Home / Self Care | Payer: Medicare Other | Source: Ambulatory Visit | Attending: Nephrology | Admitting: Nephrology

## 2017-03-30 DIAGNOSIS — R319 Hematuria, unspecified: Secondary | ICD-10-CM | POA: Insufficient documentation

## 2017-03-30 DIAGNOSIS — R809 Proteinuria, unspecified: Secondary | ICD-10-CM | POA: Diagnosis not present

## 2017-03-30 DIAGNOSIS — N183 Chronic kidney disease, stage 3 unspecified: Secondary | ICD-10-CM

## 2017-03-30 DIAGNOSIS — N179 Acute kidney failure, unspecified: Secondary | ICD-10-CM | POA: Diagnosis not present

## 2017-04-03 ENCOUNTER — Ambulatory Visit (INDEPENDENT_AMBULATORY_CARE_PROVIDER_SITE_OTHER): Payer: Medicare Other | Admitting: Ophthalmology

## 2017-04-03 DIAGNOSIS — E113593 Type 2 diabetes mellitus with proliferative diabetic retinopathy without macular edema, bilateral: Secondary | ICD-10-CM

## 2017-04-03 DIAGNOSIS — I10 Essential (primary) hypertension: Secondary | ICD-10-CM | POA: Diagnosis not present

## 2017-04-03 DIAGNOSIS — H35033 Hypertensive retinopathy, bilateral: Secondary | ICD-10-CM | POA: Diagnosis not present

## 2017-04-03 DIAGNOSIS — E11319 Type 2 diabetes mellitus with unspecified diabetic retinopathy without macular edema: Secondary | ICD-10-CM | POA: Diagnosis not present

## 2017-04-05 DIAGNOSIS — I129 Hypertensive chronic kidney disease with stage 1 through stage 4 chronic kidney disease, or unspecified chronic kidney disease: Secondary | ICD-10-CM | POA: Diagnosis not present

## 2017-04-05 DIAGNOSIS — R809 Proteinuria, unspecified: Secondary | ICD-10-CM | POA: Diagnosis not present

## 2017-04-05 DIAGNOSIS — N184 Chronic kidney disease, stage 4 (severe): Secondary | ICD-10-CM | POA: Diagnosis not present

## 2017-04-05 DIAGNOSIS — E1122 Type 2 diabetes mellitus with diabetic chronic kidney disease: Secondary | ICD-10-CM | POA: Diagnosis not present

## 2017-04-05 DIAGNOSIS — N2581 Secondary hyperparathyroidism of renal origin: Secondary | ICD-10-CM | POA: Diagnosis not present

## 2017-04-09 ENCOUNTER — Inpatient Hospital Stay
Admission: EM | Admit: 2017-04-09 | Discharge: 2017-04-13 | DRG: 291 | Disposition: A | Discharge status: Home Health | Payer: Medicare Other | Attending: Internal Medicine | Admitting: Internal Medicine

## 2017-04-09 ENCOUNTER — Emergency Department: Payer: Medicare Other

## 2017-04-09 DIAGNOSIS — I5033 Acute on chronic diastolic (congestive) heart failure: Secondary | ICD-10-CM | POA: Diagnosis present

## 2017-04-09 DIAGNOSIS — J9621 Acute and chronic respiratory failure with hypoxia: Secondary | ICD-10-CM | POA: Diagnosis present

## 2017-04-09 DIAGNOSIS — E785 Hyperlipidemia, unspecified: Secondary | ICD-10-CM | POA: Diagnosis present

## 2017-04-09 DIAGNOSIS — K219 Gastro-esophageal reflux disease without esophagitis: Secondary | ICD-10-CM | POA: Diagnosis present

## 2017-04-09 DIAGNOSIS — E119 Type 2 diabetes mellitus without complications: Secondary | ICD-10-CM | POA: Diagnosis not present

## 2017-04-09 DIAGNOSIS — N189 Chronic kidney disease, unspecified: Secondary | ICD-10-CM

## 2017-04-09 DIAGNOSIS — J9601 Acute respiratory failure with hypoxia: Secondary | ICD-10-CM | POA: Diagnosis not present

## 2017-04-09 DIAGNOSIS — Z7982 Long term (current) use of aspirin: Secondary | ICD-10-CM | POA: Diagnosis not present

## 2017-04-09 DIAGNOSIS — I132 Hypertensive heart and chronic kidney disease with heart failure and with stage 5 chronic kidney disease, or end stage renal disease: Principal | ICD-10-CM | POA: Diagnosis present

## 2017-04-09 DIAGNOSIS — F329 Major depressive disorder, single episode, unspecified: Secondary | ICD-10-CM | POA: Diagnosis present

## 2017-04-09 DIAGNOSIS — E118 Type 2 diabetes mellitus with unspecified complications: Secondary | ICD-10-CM

## 2017-04-09 DIAGNOSIS — N179 Acute kidney failure, unspecified: Secondary | ICD-10-CM | POA: Diagnosis not present

## 2017-04-09 DIAGNOSIS — J189 Pneumonia, unspecified organism: Secondary | ICD-10-CM | POA: Diagnosis not present

## 2017-04-09 DIAGNOSIS — J9690 Respiratory failure, unspecified, unspecified whether with hypoxia or hypercapnia: Secondary | ICD-10-CM

## 2017-04-09 DIAGNOSIS — J181 Lobar pneumonia, unspecified organism: Secondary | ICD-10-CM

## 2017-04-09 DIAGNOSIS — J441 Chronic obstructive pulmonary disease with (acute) exacerbation: Secondary | ICD-10-CM

## 2017-04-09 DIAGNOSIS — H409 Unspecified glaucoma: Secondary | ICD-10-CM | POA: Diagnosis not present

## 2017-04-09 DIAGNOSIS — E669 Obesity, unspecified: Secondary | ICD-10-CM | POA: Diagnosis present

## 2017-04-09 DIAGNOSIS — Z79899 Other long term (current) drug therapy: Secondary | ICD-10-CM

## 2017-04-09 DIAGNOSIS — I2510 Atherosclerotic heart disease of native coronary artery without angina pectoris: Secondary | ICD-10-CM | POA: Diagnosis not present

## 2017-04-09 DIAGNOSIS — Z9981 Dependence on supplemental oxygen: Secondary | ICD-10-CM | POA: Diagnosis not present

## 2017-04-09 DIAGNOSIS — I252 Old myocardial infarction: Secondary | ICD-10-CM

## 2017-04-09 DIAGNOSIS — E875 Hyperkalemia: Secondary | ICD-10-CM

## 2017-04-09 DIAGNOSIS — Z794 Long term (current) use of insulin: Secondary | ICD-10-CM

## 2017-04-09 DIAGNOSIS — Z87891 Personal history of nicotine dependence: Secondary | ICD-10-CM

## 2017-04-09 DIAGNOSIS — J440 Chronic obstructive pulmonary disease with acute lower respiratory infection: Secondary | ICD-10-CM | POA: Diagnosis present

## 2017-04-09 DIAGNOSIS — E1122 Type 2 diabetes mellitus with diabetic chronic kidney disease: Secondary | ICD-10-CM | POA: Diagnosis not present

## 2017-04-09 DIAGNOSIS — I509 Heart failure, unspecified: Secondary | ICD-10-CM | POA: Diagnosis not present

## 2017-04-09 DIAGNOSIS — N185 Chronic kidney disease, stage 5: Secondary | ICD-10-CM | POA: Diagnosis not present

## 2017-04-09 DIAGNOSIS — D631 Anemia in chronic kidney disease: Secondary | ICD-10-CM | POA: Diagnosis present

## 2017-04-09 DIAGNOSIS — N184 Chronic kidney disease, stage 4 (severe): Secondary | ICD-10-CM | POA: Diagnosis not present

## 2017-04-09 DIAGNOSIS — F419 Anxiety disorder, unspecified: Secondary | ICD-10-CM | POA: Diagnosis present

## 2017-04-09 DIAGNOSIS — R0602 Shortness of breath: Secondary | ICD-10-CM | POA: Diagnosis not present

## 2017-04-09 DIAGNOSIS — Z6830 Body mass index (BMI) 30.0-30.9, adult: Secondary | ICD-10-CM

## 2017-04-09 DIAGNOSIS — I130 Hypertensive heart and chronic kidney disease with heart failure and stage 1 through stage 4 chronic kidney disease, or unspecified chronic kidney disease: Secondary | ICD-10-CM | POA: Diagnosis not present

## 2017-04-09 DIAGNOSIS — I340 Nonrheumatic mitral (valve) insufficiency: Secondary | ICD-10-CM | POA: Diagnosis not present

## 2017-04-09 DIAGNOSIS — J9600 Acute respiratory failure, unspecified whether with hypoxia or hypercapnia: Secondary | ICD-10-CM | POA: Diagnosis not present

## 2017-04-09 HISTORY — DX: Respiratory failure, unspecified, unspecified whether with hypoxia or hypercapnia: J96.90

## 2017-04-09 LAB — CBC WITH DIFFERENTIAL/PLATELET
BASOS ABS: 0 10*3/uL (ref 0–0.1)
BASOS PCT: 0 %
Eosinophils Absolute: 0.1 10*3/uL (ref 0–0.7)
Eosinophils Relative: 1 %
HEMATOCRIT: 29.5 % — AB (ref 35.0–47.0)
HEMOGLOBIN: 9.5 g/dL — AB (ref 12.0–16.0)
Lymphocytes Relative: 13 %
Lymphs Abs: 0.8 10*3/uL — ABNORMAL LOW (ref 1.0–3.6)
MCH: 27.6 pg (ref 26.0–34.0)
MCHC: 32.2 g/dL (ref 32.0–36.0)
MCV: 85.6 fL (ref 80.0–100.0)
Monocytes Absolute: 0.5 10*3/uL (ref 0.2–0.9)
Monocytes Relative: 8 %
NEUTROS ABS: 4.5 10*3/uL (ref 1.4–6.5)
NEUTROS PCT: 78 %
Platelets: 202 10*3/uL (ref 150–440)
RBC: 3.45 MIL/uL — ABNORMAL LOW (ref 3.80–5.20)
RDW: 15 % — ABNORMAL HIGH (ref 11.5–14.5)
WBC: 5.9 10*3/uL (ref 3.6–11.0)

## 2017-04-09 LAB — BASIC METABOLIC PANEL
Anion gap: 8 (ref 5–15)
BUN: 48 mg/dL — ABNORMAL HIGH (ref 6–20)
CHLORIDE: 104 mmol/L (ref 101–111)
CO2: 26 mmol/L (ref 22–32)
CREATININE: 2.92 mg/dL — AB (ref 0.44–1.00)
Calcium: 8.6 mg/dL — ABNORMAL LOW (ref 8.9–10.3)
GFR calc non Af Amer: 14 mL/min — ABNORMAL LOW (ref >60)
GFR, EST AFRICAN AMERICAN: 16 mL/min — AB (ref >60)
Glucose, Bld: 107 mg/dL — ABNORMAL HIGH (ref 65–99)
POTASSIUM: 5.5 mmol/L — AB (ref 3.5–5.1)
SODIUM: 138 mmol/L (ref 135–145)

## 2017-04-09 LAB — TROPONIN I: TROPONIN I: 0.06 ng/mL — AB (ref <0.03)

## 2017-04-09 LAB — BRAIN NATRIURETIC PEPTIDE: B Natriuretic Peptide: 505 pg/mL — ABNORMAL HIGH (ref 0.0–100.0)

## 2017-04-09 MED ORDER — FUROSEMIDE 10 MG/ML IJ SOLN
40.0000 mg | Freq: Once | INTRAMUSCULAR | Status: AC
  Administered 2017-04-09: 40 mg via INTRAVENOUS
  Filled 2017-04-09: qty 4

## 2017-04-09 MED ORDER — METHYLPREDNISOLONE SODIUM SUCC 125 MG IJ SOLR
125.0000 mg | Freq: Once | INTRAMUSCULAR | Status: AC
  Administered 2017-04-09: 125 mg via INTRAVENOUS
  Filled 2017-04-09: qty 2

## 2017-04-09 MED ORDER — INSULIN ASPART 100 UNIT/ML ~~LOC~~ SOLN
0.0000 [IU] | Freq: Every day | SUBCUTANEOUS | Status: DC
  Administered 2017-04-10 – 2017-04-12 (×2): 3 [IU] via SUBCUTANEOUS
  Filled 2017-04-09 (×2): qty 1

## 2017-04-09 MED ORDER — IPRATROPIUM-ALBUTEROL 0.5-2.5 (3) MG/3ML IN SOLN
RESPIRATORY_TRACT | Status: AC
  Filled 2017-04-09: qty 3

## 2017-04-09 MED ORDER — DEXTROSE 5 % IV SOLN
500.0000 mg | INTRAVENOUS | Status: DC
  Administered 2017-04-10 – 2017-04-11 (×2): 500 mg via INTRAVENOUS
  Filled 2017-04-09 (×3): qty 500

## 2017-04-09 MED ORDER — DEXTROSE 5 % IV SOLN
500.0000 mg | Freq: Once | INTRAVENOUS | Status: AC
  Administered 2017-04-09: 500 mg via INTRAVENOUS
  Filled 2017-04-09: qty 500

## 2017-04-09 MED ORDER — CEFTRIAXONE SODIUM IN DEXTROSE 20 MG/ML IV SOLN
1.0000 g | INTRAVENOUS | Status: DC
  Filled 2017-04-09: qty 50

## 2017-04-09 MED ORDER — CEFTRIAXONE SODIUM IN DEXTROSE 20 MG/ML IV SOLN
1.0000 g | Freq: Once | INTRAVENOUS | Status: AC
  Administered 2017-04-09: 1 g via INTRAVENOUS
  Filled 2017-04-09: qty 50

## 2017-04-09 MED ORDER — IPRATROPIUM-ALBUTEROL 0.5-2.5 (3) MG/3ML IN SOLN
6.0000 mL | Freq: Once | RESPIRATORY_TRACT | Status: AC
  Administered 2017-04-09: 6 mL via RESPIRATORY_TRACT
  Filled 2017-04-09: qty 3

## 2017-04-09 MED ORDER — INSULIN ASPART 100 UNIT/ML ~~LOC~~ SOLN
0.0000 [IU] | Freq: Three times a day (TID) | SUBCUTANEOUS | Status: DC
  Administered 2017-04-10: 4 [IU] via SUBCUTANEOUS
  Administered 2017-04-10: 3 [IU] via SUBCUTANEOUS
  Administered 2017-04-10 – 2017-04-12 (×4): 4 [IU] via SUBCUTANEOUS
  Administered 2017-04-13: 7 [IU] via SUBCUTANEOUS
  Administered 2017-04-13: 15 [IU] via SUBCUTANEOUS
  Filled 2017-04-09 (×8): qty 1

## 2017-04-09 MED ORDER — PATIROMER SORBITEX CALCIUM 8.4 G PO PACK
8.4000 g | PACK | Freq: Every day | ORAL | Status: DC
  Administered 2017-04-10 – 2017-04-12 (×3): 8.4 g via ORAL
  Filled 2017-04-09 (×5): qty 4

## 2017-04-09 MED ORDER — ALBUTEROL SULFATE (2.5 MG/3ML) 0.083% IN NEBU
5.0000 mg | INHALATION_SOLUTION | Freq: Once | RESPIRATORY_TRACT | Status: AC
  Administered 2017-04-09: 5 mg via RESPIRATORY_TRACT
  Filled 2017-04-09: qty 6

## 2017-04-09 NOTE — ED Notes (Signed)
Pt resting quietly with her eyes closed, cont to monitor

## 2017-04-09 NOTE — ED Provider Notes (Signed)
[facility] Emergency Department Provider Note  ___________________________________________   First MD Initiated Contact with Patient 04/09/17 1845     (approximate)  I have reviewed the triage vital signs and the nursing notes.   HISTORY  Chief Complaint Shortness of Breath    HPI Lori Stanley is a 83 y.o. female with a history of CHF as well as chronic bronchitis who is presenting to the emergency department today with 3 days of worsening shortness of breath.  She is denying chest pain.  She says that she is also had increased lower extremity swelling bilaterally over the past 3 days.  She says that she is supposed to be on supplemental oxygen at home but has not been in over a year.  She reports compliance with her Lasix.  Found to be about 65% on room air oxygen in triage.  Denies fever.  Reports dry cough.  Past Medical History:  Diagnosis Date  . Acute heart failure ([place])   . Chronic low back pain   . Diabetes mellitus without complication ([place])   . Hyperlipidemia   . Hypertension   . MI (myocardial infarction) ([place])   . Obesity     Patient Active Problem List   Diagnosis Date Noted  . Syncopal episodes 12/15/2015  . Absolute anemia 04/14/2015  . Anxiety 04/14/2015  . Clinical depression 04/14/2015  . Essential (primary) hypertension 04/14/2015  . Gastro-esophageal reflux disease without esophagitis 04/14/2015  . Arthritis, degenerative 04/14/2015  . Allergic rhinitis 04/14/2015  . Type 2 diabetes mellitus ([place]) 04/14/2015  . Hyperlipidemia 02/27/2015  . Glaucoma 12/23/2014  . Diabetes ([place]) 12/23/2014  . GERD (gastroesophageal reflux disease) 12/23/2014  . Essential hypertension, malignant 05/24/2013  . SOB (shortness of breath) 05/24/2013  . Chronic diastolic heart failure ([place]) 05/24/2013    Past Surgical History:  Procedure Laterality Date  . EYE SURGERY    . KNEE SURGERY    . THYROID SURGERY    . VAGINAL HYSTERECTOMY        Prior to Admission medications   Medication Sig Start Date End Date Taking? Authorizing Provider  acetaminophen (TYLENOL) 500 MG tablet Take 1,000 mg by mouth every 6 (six) hours as needed.    [provider]  amLODipine (NORVASC) 5 MG tablet Take 1 tablet (5 mg total) by mouth daily. 10/26/16   Jerrol Banana., MD  amoxicillin-clavulanate (AUGMENTIN) 875-125 MG tablet Take 1 tablet by mouth 2 (two) times daily. 01/17/17   Jerrol Banana., MD  aspirin 81 MG EC tablet TAKE 1 TABLET (81 MG TOTAL) BY MOUTH DAILY. 01/09/17   Jerrol Banana., MD  B-D UF III MINI PEN NEEDLES 31G X 5 MM MISC USE TWICE A DAY AS DIRECTED 02/01/17   Jerrol Banana., MD  Bepotastine Besilate (BEPREVE) 1.5 % SOLN Place 1 drop into both eyes daily.    [provider]  carvedilol (COREG) 12.5 MG tablet TAKE 1 TABLET BY MOUTH TWICE A DAY 01/09/17   Jerrol Banana., MD  clonazePAM (KLONOPIN) 0.5 MG tablet TAKE 1 TABLET BY MOUTH TWICE A DAY AS NEEDED 03/31/16   Jerrol Banana., MD  dorzolamide (TRUSOPT) 2 % ophthalmic solution Place 1 drop into both eyes 3 (three) times daily. 01/17/17   Jerrol Banana., MD  furosemide (LASIX) 20 MG tablet TAKE 1 TABLET (20 MG TOTAL) BY MOUTH 2 (TWO) TIMES DAILY. 02/14/17   Jerrol Banana., MD  HUMALOG  MIX 75/25 KWIKPEN (75-25) 100 UNIT/ML Kwikpen INJECT 60 UNITS INTO THE SKIN 2 (TWO) TIMES DAILY. 12/12/16   Jerrol Banana., MD  latanoprost (XALATAN) 0.005 % ophthalmic solution Place 1 drop into the left eye at bedtime.  02/11/15   [provider]  loratadine (CLARITIN) 10 MG tablet Take 1 tablet (10 mg total) by mouth daily. 12/15/14   Jerrol Banana., MD  quinapril (ACCUPRIL) 40 MG tablet TAKE 1 TABLET BY MOUTH EVERY DAY 09/24/16   Jerrol Banana., MD  ranitidine (ZANTAC) 150 MG capsule Take 1 capsule (150 mg total) by mouth 2 (two) times daily. 12/23/14   Jerrol Banana., MD  simvastatin  (ZOCOR) 40 MG tablet TAKE 1 TABLET (40 MG TOTAL) BY MOUTH DAILY. 10/17/16   Jerrol Banana., MD  traZODone (DESYREL) 150 MG tablet TAKE 1 TABLET BY MOUTH EVERY NIGHT AT BEDTIME 04/27/16   Carmon Ginsberg, PA    Allergies Antihistamines, chlorpheniramine-type; Codeine; and Paxil [paroxetine]  Family History  Problem Relation Age of Onset  . Heart attack Mother     Social History Social History   Tobacco Use  . Smoking status: Former Smoker    Types: Cigarettes  . Smokeless tobacco: Never Used  Substance Use Topics  . Alcohol use: No  . Drug use: No    Review of Systems  Constitutional: No fever/chills Eyes: No visual changes. ENT: No sore throat. Cardiovascular: Denies chest pain. Respiratory: As above Gastrointestinal: No abdominal pain.  No nausea, no vomiting.  No diarrhea.  No constipation. Genitourinary: Negative for dysuria. Musculoskeletal: Negative for back pain. Skin: Negative for rash. Neurological: Negative for headaches, focal weakness or numbness.   ____________________________________________   PHYSICAL EXAM:  VITAL SIGNS: ED Triage Vitals  Enc Vitals Group     BP 04/09/17 1845 (!) 179/82     Pulse Rate 04/09/17 1842 95     Resp 04/09/17 1845 (!) 32     Temp 04/09/17 1845 98.2 F (36.8 C)     Temp Source 04/09/17 1845 Oral     SpO2 04/09/17 1845 100 %     Weight 04/09/17 1842 155 lb (70.3 kg)     Height 04/09/17 1842 5\' 3"  (1.6 m)     Head Circumference --      Peak Flow --      Pain Score --      Pain Loc --      Pain Edu? --      Excl. in [place]? --     Constitutional: Alert and oriented.  Using accessory muscles for breathing. Eyes: Conjunctivae are normal.  Head: Atraumatic. Nose: No congestion/rhinnorhea. Mouth/Throat: Mucous membranes are moist.  Neck: No stridor.   Cardiovascular: Normal rate, regular rhythm. Grossly normal heart sounds.   Respiratory: Tachypneic with severely reduced lung sounds throughout.  Prolonged  expiratory phase.  Rales at the bilateral bases.  Using accessory muscles with supraclavicular retractions bilaterally. Gastrointestinal: Soft and nontender. No distention.  Musculoskeletal: Moderate edema to the bilateral lower extremities. Neurologic:  Normal speech and language. No gross focal neurologic deficits are appreciated. Skin:  Skin is warm, dry and intact. No rash noted. Psychiatric: Mood and affect are normal. Speech and behavior are normal.  ____________________________________________   LABS (all labs ordered are listed, but only abnormal results are displayed)  Labs Reviewed  CBC WITH DIFFERENTIAL/PLATELET - Abnormal; Notable for the following components:      Result Value   RBC 3.45 (*)  Hemoglobin 9.5 (*)    HCT 29.5 (*)    RDW 15.0 (*)    Lymphs Abs 0.8 (*)    All other components within normal limits  BRAIN NATRIURETIC PEPTIDE - Abnormal; Notable for the following components:   B Natriuretic Peptide 505.0 (*)    All other components within normal limits  BASIC METABOLIC PANEL - Abnormal; Notable for the following components:   Potassium 5.5 (*)    Glucose, Bld 107 (*)    BUN 48 (*)    Creatinine, Ser 2.92 (*)    Calcium 8.6 (*)    GFR calc non Af Amer 14 (*)    GFR calc Af Amer 16 (*)    All other components within normal limits  TROPONIN I - Abnormal; Notable for the following components:   Troponin I 0.06 (*)    All other components within normal limits   ____________________________________________  EKG  ED ECG REPORT I, Doran Stabler, the attending physician, personally viewed and interpreted this ECG.   Date: 04/09/2017  EKG Time: 1848  Rate: 82  Rhythm: normal sinus rhythm  Axis: Normal  Intervals:none  ST&T Change: T wave inversion in aVL.  Otherwise no abnormal T wave inversions.  No ST segment elevation or depression. No significant change from previous. ____________________________________________  RADIOLOGY  Left lower lobe  pneumonia. ____________________________________________   PROCEDURES  Procedure(s) performed:   Procedures  Critical Care performed:  CRITICAL CARE Performed by: Doran Stabler   Total critical care time: 35 minutes  Critical care time was exclusive of separately billable procedures and treating other patients.  Critical care was necessary to treat or prevent imminent or life-threatening deterioration.  Critical care was time spent personally by me on the following activities: development of treatment plan with patient and/or surrogate as well as nursing, discussions with consultants, evaluation of patient's response to treatment, examination of patient, obtaining history from patient or surrogate, ordering and performing treatments and interventions, ordering and review of laboratory studies, ordering and review of radiographic studies, pulse oximetry and re-evaluation of patient's condition.   ____________________________________________   INITIAL IMPRESSION / ASSESSMENT AND PLAN / ED COURSE  Pertinent labs & imaging results that were available during my care of the patient were reviewed by me and considered in my medical decision making (see chart for details).  Differential includes, but is not limited to, viral syndrome, bronchitis including COPD exacerbation, pneumonia, reactive airway disease including asthma, CHF including exacerbation with or without pulmonary/interstitial edema, pneumothorax, ACS, thoracic trauma, and pulmonary embolism.  As part of my medical decision making, I reviewed the following data within the [place] chart reviewed  ----------------------------------------- 8:26 PM on 04/09/2017 -----------------------------------------  Patient placed on BiPAP and is tolerating well.  Says that it is helping with her breathing.  We auscultate her lungs and there continues to be severely decreased lung sounds throughout.  However,  she has just been medicated.  Patient will be sustained a BiPAP and will be admitted to the hospital.  Her oxygen saturation continues to be 100%.  Diagnosis as well as treatment plan was explained to the patient as well as family.  Signed out to Dr. Vella Kohler.      ____________________________________________   FINAL CLINICAL IMPRESSION(S) / ED DIAGNOSES  CHF.  COPD.  Community acquired pneumonia.    NEW MEDICATIONS STARTED DURING THIS VISIT:  This SmartLink is deprecated. Use AVSMEDLIST instead to display the medication list for a patient.   Note:  This document was prepared using Dragon voice recognition software and may include unintentional dictation errors.     Orbie Pyo, MD 04/09/17 2027

## 2017-04-09 NOTE — ED Triage Notes (Signed)
Pt reports worsening SOB over the last few days. Pt wears oxygen as needed at home. Pt arrives on home oxygen which was almost out. Worsening leg swelling over last 2 days. Pt increased WOB. 65% on RA.

## 2017-04-09 NOTE — ED Notes (Signed)
Pt presents to ER with labored breathing, O2 tank empty in her car as she was assisted out of car to wheelchair, pt's O2 sat at 65%. Pt is labored and has audible expiratory wheezing. Pt states that her difficulty breathing started 3 days ago and states that she saw her dr on Thursday who increased her fluid pill, pt states that she isn't usually on O2, but has it from when she had a heart attack a few years ago. Pt has increased swelling to her bilat lower extrem and also is noted to have facial swelling and periorbital edema that is confirmed by her sister. Pt reports some relief with the non-rebreather mask

## 2017-04-09 NOTE — ED Notes (Signed)
Pt resting quietly, no distress noted, family at bedside

## 2017-04-09 NOTE — Progress Notes (Signed)
ANTIBIOTIC CONSULT NOTE - INITIAL  Pharmacy Consult for azithromycin and ceftriaxone Indication: pneumonia  Allergies  Allergen Reactions  . Antihistamines, Chlorpheniramine-Type   . Codeine   . Paxil [Paroxetine]     "makes her feel funny" not in a good way    Patient Measurements: Height: 5\' 3"  (160 cm) Weight: 155 lb (70.3 kg) IBW/kg (Calculated) : 52.4 Adjusted Body Weight:   Vital Signs: Temp: 98.2 F (36.8 C) (12/09 1845) Temp Source: Oral (12/09 1845) BP: 123/65 (12/09 2000) Pulse Rate: 73 (12/09 2000) Intake/Output from previous day: No intake/output data recorded. Intake/Output from this shift: No intake/output data recorded.  Labs: Recent Labs    04/09/17 1850  WBC 5.9  HGB 9.5*  PLT 202  CREATININE 2.92*   Estimated Creatinine Clearance: 13.7 mL/min (A) (by C-G formula based on SCr of 2.92 mg/dL (H)). No results for input(s): VANCOTROUGH, VANCOPEAK, VANCORANDOM, GENTTROUGH, GENTPEAK, GENTRANDOM, TOBRATROUGH, TOBRAPEAK, TOBRARND, AMIKACINPEAK, AMIKACINTROU, AMIKACIN in the last 72 hours.   Microbiology: No results found for this or any previous visit (from the past 720 hour(s)).  Medical History: Past Medical History:  Diagnosis Date  . Acute heart failure ([place])   . Chronic low back pain   . Diabetes mellitus without complication ([place])   . Hyperlipidemia   . Hypertension   . MI (myocardial infarction) ([place])   . Obesity     Medications:  Infusions:  . azithromycin    . [START ON 04/10/2017] azithromycin    . cefTRIAXone (ROCEPHIN)  IV    . [START ON 04/10/2017] cefTRIAXone (ROCEPHIN)  IV     Assessment: 59 yof to ED with SOB, on O2 at home. O2 65% on RA in ED. Pharmacy consulted to dose azithromycin and ceftriaxone for CAP.  Goal of Therapy:  Resolve infection Prevent ADE  Plan:  1. Azithromycin 500 mg IV Q24H 2. Ceftriaxone 1 gm IV Q24H  Laural Benes, Pharm.D., BCPS Clinical Pharmacist 04/09/2017,8:22 PM

## 2017-04-09 NOTE — Consult Note (Signed)
[facility] Consultation    ASSESSMENT/PLAN   Respiratory distress. Clinically consistent with congestive heart failure, elevated BNP, chest x-ray shows pulmonary edema pattern, history of ischemic cardiac disease. Agree with noninvasive ventilation, diuretic therapy, EKG and cardiac enzymes along with echocardiography. Patient being empirically treated for community-acquired pneumonia with azithromycin and Rocephin along with as needed rescue bronchodilator therapy. We'll clinically consistent with congestive heart failure  Renal insufficiency. BUN is 48 and creatinine 2.92, will follow closely, if does not improve will order renal ultrasound, bladder scan [name] support hemodynamics and avoid nephrotoxic agents  Hyperkalemia, will repeat BMP.   Will follow with you  Name: Lori Stanley MRN: [id number] DOB: 1931/12/15    ADMISSION DATE:  04/09/2017 CONSULTATION DATE:  04/09/2017  REFERRING MD :  Hospitalist  CHIEF COMPLAINT:  Shortness of breath   HISTORY OF PRESENT ILLNESS:  Lori Stanley is a very pleasant 83 year old African-American female with a past medical history remarkable for hypertension, hyperlipidemia, diabetes, ischemic cardiac disease, congestive heart failure presents with 3 days of progressive increasing lower extremity swelling, aggressive dyspnea, PND and orthopnea, presented to the emergency department in respiratory distress requiring positive pressure. She was also given diuretics. Presently she is resting comfortably, saturating in the mid 90s in no acute distress. She states she is feeling better. She denied any chest pain, denied any fever, chills, sweats, did have some sputum predominantly clear  PAST MEDICAL HISTORY :  Past Medical History:  Diagnosis Date  . Acute heart failure ([place])   . Chronic low back pain   . Diabetes mellitus without complication ([place])   . Hyperlipidemia   . Hypertension   . MI (myocardial infarction)  ([place])   . Obesity    Past Surgical History:  Procedure Laterality Date  . EYE SURGERY    . KNEE SURGERY    . THYROID SURGERY    . VAGINAL HYSTERECTOMY     Prior to Admission medications   Medication Sig Start Date End Date Taking? Authorizing Provider  acetaminophen (TYLENOL) 500 MG tablet Take 1,000 mg by mouth every 6 (six) hours as needed.    [provider]  amLODipine (NORVASC) 5 MG tablet Take 1 tablet (5 mg total) by mouth daily. 10/26/16   Jerrol Banana., MD  amoxicillin-clavulanate (AUGMENTIN) 875-125 MG tablet Take 1 tablet by mouth 2 (two) times daily. 01/17/17   Jerrol Banana., MD  aspirin 81 MG EC tablet TAKE 1 TABLET (81 MG TOTAL) BY MOUTH DAILY. 01/09/17   Jerrol Banana., MD  B-D UF III MINI PEN NEEDLES 31G X 5 MM MISC USE TWICE A DAY AS DIRECTED 02/01/17   Jerrol Banana., MD  Bepotastine Besilate (BEPREVE) 1.5 % SOLN Place 1 drop into both eyes daily.    [provider]  carvedilol (COREG) 12.5 MG tablet TAKE 1 TABLET BY MOUTH TWICE A DAY 01/09/17   Jerrol Banana., MD  clonazePAM (KLONOPIN) 0.5 MG tablet TAKE 1 TABLET BY MOUTH TWICE A DAY AS NEEDED 03/31/16   Jerrol Banana., MD  dorzolamide (TRUSOPT) 2 % ophthalmic solution Place 1 drop into both eyes 3 (three) times daily. 01/17/17   Jerrol Banana., MD  furosemide (LASIX) 20 MG tablet TAKE 1 TABLET (20 MG TOTAL) BY MOUTH 2 (TWO) TIMES DAILY. 02/14/17   Jerrol Banana., MD  HUMALOG MIX 75/25 KWIKPEN (75-25) 100 UNIT/ML Kwikpen INJECT 60 UNITS INTO THE SKIN 2 (TWO) TIMES  DAILY. 12/12/16   Jerrol Banana., MD  latanoprost (XALATAN) 0.005 % ophthalmic solution Place 1 drop into the left eye at bedtime.  02/11/15   [provider]  loratadine (CLARITIN) 10 MG tablet Take 1 tablet (10 mg total) by mouth daily. 12/15/14   Jerrol Banana., MD  quinapril (ACCUPRIL) 40 MG tablet TAKE 1 TABLET BY MOUTH EVERY DAY 09/24/16   Jerrol Banana., MD  ranitidine (ZANTAC) 150 MG capsule Take 1 capsule (150 mg total) by mouth 2 (two) times daily. 12/23/14   Jerrol Banana., MD  simvastatin (ZOCOR) 40 MG tablet TAKE 1 TABLET (40 MG TOTAL) BY MOUTH DAILY. 10/17/16   Jerrol Banana., MD  traZODone (DESYREL) 150 MG tablet TAKE 1 TABLET BY MOUTH EVERY NIGHT AT BEDTIME 04/27/16   Carmon Ginsberg, PA   Allergies  Allergen Reactions  . Antihistamines, Chlorpheniramine-Type   . Codeine   . Paxil [Paroxetine]     "makes her feel funny" not in a good way    FAMILY HISTORY:  Family History  Problem Relation Age of Onset  . Heart attack Mother    SOCIAL HISTORY:  reports that she has quit smoking. Her smoking use included cigarettes. she has never used smokeless tobacco. She reports that she does not drink alcohol or use drugs.  REVIEW OF SYSTEMS:    The remainder of systems were reviewed and were found to be negative other than what is documented in the HPI.    VITAL SIGNS: Temp:  [98.2 F (36.8 C)] 98.2 F (36.8 C) (12/09 1845) Pulse Rate:  [61-95] 64 (12/09 2300) Resp:  [15-32] 16 (12/09 2300) BP: (122-179)/(54-90) 136/68 (12/09 2300) SpO2:  [88 %-100 %] 93 % (12/09 2300) FiO2 (%):  [100 %] 100 % (12/09 1900) Weight:  [70.3 kg (155 lb)] 70.3 kg (155 lb) (12/09 1842) HEMODYNAMICS:   VENTILATOR SETTINGS: FiO2 (%):  [100 %] 100 % INTAKE / OUTPUT: No intake or output data in the 24 hours ending 04/09/17 2329  Physical Examination:   VS: BP 136/68   Pulse 64   Temp 98.2 F (36.8 C) (Oral)   Resp 16   Ht 5\' 3"  (1.6 m)   Wt 70.3 kg (155 lb)   SpO2 93%   BMI 27.46 kg/m   General Appearance: No distress  Neuro:without focal findings, mental status, speech normal,. HEENT: Presently on BiPAP, trachea is midline, no stridor or accessory muscle utilization, patient resting comfortably  Pulmonary: Bilateral crackles appreciated posteriorly lung zones Cardiovascular regular rate and rhythm, systolic murmur left  parasternal border    Abdomen: Benign, Soft, non-tender, No masses, hepatosplenomegaly, No lymphadenopathy Renal:  No costovertebral tenderness  Extremities: Extensive lower extremity edema with some erythema, chronic venous stasis changes, loss of hair   LABS: Reviewed   LABORATORY PANEL:   CBC Recent Labs  Lab 04/09/17 1850  WBC 5.9  HGB 9.5*  HCT 29.5*  PLT 202    Chemistries  Recent Labs  Lab 04/09/17 1850  NA 138  K 5.5*  CL 104  CO2 26  GLUCOSE 107*  BUN 48*  CREATININE 2.92*  CALCIUM 8.6*    No results for input(s): GLUCAP in the last 168 hours. No results for input(s): PHART, PCO2ART, PO2ART in the last 168 hours. No results for input(s): AST, ALT, ALKPHOS, BILITOT, ALBUMIN in the last 168 hours.  Cardiac Enzymes Recent Labs  Lab 04/09/17 1850  TROPONINI 0.06*    RADIOLOGY:  Dg Chest Portable 1 View  Result Date: 04/09/2017 CLINICAL DATA:  Progressive shortness of breath over the last few days. Increasing lower extremity swelling. Hypoxia. EXAM: PORTABLE CHEST 1 VIEW COMPARISON:  Two-view chest x-ray 07/18/2013 FINDINGS: The heart is enlarged. Left lower lobe airspace disease is present. There is minimal atelectasis at the right base with chronic elevation of the right hemidiaphragm. Mild pulmonary vascular congestion is noted. The visualized soft tissues and bony thorax are unremarkable. IMPRESSION: 1. New left lower lobe pneumonia. 2. Minimal right basilar atelectasis. 3. Cardiomegaly with mild pulmonary vascular congestion. Electronically Signed   By: San Morelle M.D.   On: 04/09/2017 19:09     Hermelinda Dellen, DO   04/09/2017, 11:29 PM

## 2017-04-09 NOTE — ED Notes (Signed)
Pt states that she is feeling better and that she is tolerating the bipap well, cont to monitor

## 2017-04-09 NOTE — H&P (Signed)
[facility] NAME: Lori Stanley    MR#:  [id number]  DATE OF BIRTH:  07-12-1931  DATE OF ADMISSION:  04/09/2017  PRIMARY CARE PHYSICIAN: Jerrol Banana., MD   REQUESTING/REFERRING PHYSICIAN:   CHIEF COMPLAINT:   Chief Complaint  Patient presents with  . Shortness of Breath    HISTORY OF PRESENT ILLNESS: Lori Stanley  is a 83 y.o. female with a known history per below presents emergency room with 2-3-day history of worsening shortness of breath with worsening lower extremity edema, noted O2 saturation 65% on room air in the emergency room, requiring BiPAP to maintain appropriate oxygenation, patient admits to eating salt without restriction as well as drinking lots of water, chest x-ray noted for pneumonia/mild edema, BNP greater than 500, patient evaluated emergency room, family at the bedside, patient now being admitted for acute decompensated congestive heart failure and community acquired pneumonia. PAST MEDICAL HISTORY:   Past Medical History:  Diagnosis Date  . Acute heart failure ([place])   . Chronic low back pain   . Diabetes mellitus without complication ([place])   . Hyperlipidemia   . Hypertension   . MI (myocardial infarction) ([place])   . Obesity     PAST SURGICAL HISTORY:  Past Surgical History:  Procedure Laterality Date  . EYE SURGERY    . KNEE SURGERY    . THYROID SURGERY    . VAGINAL HYSTERECTOMY      SOCIAL HISTORY:  Social History   Tobacco Use  . Smoking status: Former Smoker    Types: Cigarettes  . Smokeless tobacco: Never Used  Substance Use Topics  . Alcohol use: No    FAMILY HISTORY:  Family History  Problem Relation Age of Onset  . Heart attack Mother     DRUG ALLERGIES:  Allergies  Allergen Reactions  . Antihistamines, Chlorpheniramine-Type   . Codeine   . Paxil [Paroxetine]     "makes her feel funny" not in a good way    REVIEW OF SYSTEMS:  Unable to be obtained given  acute respiratory failure, on BiPAP  MEDICATIONS AT HOME:  Prior to Admission medications   Medication Sig Start Date End Date Taking? Authorizing Provider  acetaminophen (TYLENOL) 500 MG tablet Take 1,000 mg by mouth every 6 (six) hours as needed.    [provider]  amLODipine (NORVASC) 5 MG tablet Take 1 tablet (5 mg total) by mouth daily. 10/26/16   Jerrol Banana., MD  amoxicillin-clavulanate (AUGMENTIN) 875-125 MG tablet Take 1 tablet by mouth 2 (two) times daily. 01/17/17   Jerrol Banana., MD  aspirin 81 MG EC tablet TAKE 1 TABLET (81 MG TOTAL) BY MOUTH DAILY. 01/09/17   Jerrol Banana., MD  B-D UF III MINI PEN NEEDLES 31G X 5 MM MISC USE TWICE A DAY AS DIRECTED 02/01/17   Jerrol Banana., MD  Bepotastine Besilate (BEPREVE) 1.5 % SOLN Place 1 drop into both eyes daily.    [provider]  carvedilol (COREG) 12.5 MG tablet TAKE 1 TABLET BY MOUTH TWICE A DAY 01/09/17   Jerrol Banana., MD  clonazePAM (KLONOPIN) 0.5 MG tablet TAKE 1 TABLET BY MOUTH TWICE A DAY AS NEEDED 03/31/16   Jerrol Banana., MD  dorzolamide (TRUSOPT) 2 % ophthalmic solution Place 1 drop into both eyes 3 (three) times daily. 01/17/17   Jerrol Banana., MD  furosemide (LASIX) 20 MG tablet TAKE  1 TABLET (20 MG TOTAL) BY MOUTH 2 (TWO) TIMES DAILY. 02/14/17   Jerrol Banana., MD  HUMALOG MIX 75/25 KWIKPEN (75-25) 100 UNIT/ML Kwikpen INJECT 60 UNITS INTO THE SKIN 2 (TWO) TIMES DAILY. 12/12/16   Jerrol Banana., MD  latanoprost (XALATAN) 0.005 % ophthalmic solution Place 1 drop into the left eye at bedtime.  02/11/15   [provider]  loratadine (CLARITIN) 10 MG tablet Take 1 tablet (10 mg total) by mouth daily. 12/15/14   Jerrol Banana., MD  quinapril (ACCUPRIL) 40 MG tablet TAKE 1 TABLET BY MOUTH EVERY DAY 09/24/16   Jerrol Banana., MD  ranitidine (ZANTAC) 150 MG capsule Take 1 capsule (150 mg total) by mouth 2 (two) times  daily. 12/23/14   Jerrol Banana., MD  simvastatin (ZOCOR) 40 MG tablet TAKE 1 TABLET (40 MG TOTAL) BY MOUTH DAILY. 10/17/16   Jerrol Banana., MD  traZODone (DESYREL) 150 MG tablet TAKE 1 TABLET BY MOUTH EVERY NIGHT AT BEDTIME 04/27/16   Carmon Ginsberg, PA      PHYSICAL EXAMINATION:   VITAL SIGNS: Blood pressure 122/64, pulse 61, temperature 98.2 F (36.8 C), temperature source Oral, resp. rate 15, height 5\' 3"  (1.6 m), weight 70.3 kg (155 lb), SpO2 94 %.  GENERAL:  83 y.o.-year-old patient lying in the bed with moderate respiratory acute distress.  Obese EYES: Pupils equal, round, reactive to light and accommodation. No scleral icterus. Extraocular muscles intact.  HEENT: Head atraumatic, normocephalic. Oropharynx and nasopharynx clear.  NECK:  Supple, no jugular venous distention. No thyroid enlargement, no tenderness.  LUNGS: Severely diminished breath sounds bilaterally, no wheezing, rales,rhonchi or crepitation. No use of accessory muscles of respiration.  CARDIOVASCULAR: S1, S2 normal. No murmurs, rubs, or gallops.  ABDOMEN: Soft, nontender, nondistended. Bowel sounds present. No organomegaly or mass.  EXTREMITIES: No pedal edema, cyanosis, or clubbing.  NEUROLOGIC: Cranial nerves II through XII are intact. MAES. Gait not checked.  PSYCHIATRIC: The patient is alert and oriented x 3.  SKIN: No obvious rash, lesion, or ulcer.   LABORATORY PANEL:   CBC Recent Labs  Lab 04/09/17 1850  WBC 5.9  HGB 9.5*  HCT 29.5*  PLT 202  MCV 85.6  MCH 27.6  MCHC 32.2  RDW 15.0*  LYMPHSABS 0.8*  MONOABS 0.5  EOSABS 0.1  BASOSABS 0.0   ------------------------------------------------------------------------------------------------------------------  Chemistries  Recent Labs  Lab 04/09/17 1850  NA 138  K 5.5*  CL 104  CO2 26  GLUCOSE 107*  BUN 48*  CREATININE 2.92*  CALCIUM 8.6*    ------------------------------------------------------------------------------------------------------------------ estimated creatinine clearance is 13.7 mL/min (A) (by C-G formula based on SCr of 2.92 mg/dL (H)). ------------------------------------------------------------------------------------------------------------------ No results for input(s): TSH, T4TOTAL, T3FREE, THYROIDAB in the last 72 hours.  Invalid input(s): FREET3   Coagulation profile No results for input(s): INR, PROTIME in the last 168 hours. ------------------------------------------------------------------------------------------------------------------- No results for input(s): DDIMER in the last 72 hours. -------------------------------------------------------------------------------------------------------------------  Cardiac Enzymes Recent Labs  Lab 04/09/17 1850  TROPONINI 0.06*   ------------------------------------------------------------------------------------------------------------------ Invalid input(s): POCBNP  ---------------------------------------------------------------------------------------------------------------  Urinalysis    Component Value Date/Time   COLORURINE Yellow 05/11/2013 2255   APPEARANCEUR Clear 05/11/2013 2255   LABSPEC 1.010 05/11/2013 2255   PHURINE 7.0 05/11/2013 2255   GLUCOSEU >=500 05/11/2013 2255   HGBUR 1+ 05/11/2013 2255   BILIRUBINUR Negative 05/11/2013 2255   KETONESUR Negative 05/11/2013 2255   PROTEINUR >=500 05/11/2013 2255   NITRITE Negative 05/11/2013 2255   LEUKOCYTESUR  Negative 05/11/2013 2255     RADIOLOGY: Dg Chest Portable 1 View  Result Date: 04/09/2017 CLINICAL DATA:  Progressive shortness of breath over the last few days. Increasing lower extremity swelling. Hypoxia. EXAM: PORTABLE CHEST 1 VIEW COMPARISON:  Two-view chest x-ray 07/18/2013 FINDINGS: The heart is enlarged. Left lower lobe airspace disease is present. There is minimal  atelectasis at the right base with chronic elevation of the right hemidiaphragm. Mild pulmonary vascular congestion is noted. The visualized soft tissues and bony thorax are unremarkable. IMPRESSION: 1. New left lower lobe pneumonia. 2. Minimal right basilar atelectasis. 3. Cardiomegaly with mild pulmonary vascular congestion. Electronically Signed   By: San Morelle M.D.   On: 04/09/2017 19:09    EKG: Orders placed or performed during the hospital encounter of 04/09/17  . ED EKG  . ED EKG  . EKG 12-Lead  . EKG 12-Lead    IMPRESSION AND PLAN: 1 acute on chronic diastolic congestive heart failure exacerbation Most likely secondary to dietary indiscretion Admit to stepdown unit, IV Lasix twice daily, strict monitoring, daily weights, will need congestive heart failure education prior to discharge, rule out acute coronary syndrome with cardiac enzymes x3 sets  2 acute left community acquired pneumonia Admit on our pneumonia protocol, empiric Rocephin/azithromycin, and follow-up on cultures  3 acute on chronic hypoxic respiratory failure Secondary to above On home oxygen as needed at home Supplement oxygen and BiPAP as needed  4 acute hyperkalemia Lasix as stated above, check BMP in the morning, Kayexalate x1  5 incomplete MAR Complete medication reconciliation when available  6 chronic kidney disease stage IV/V Near baseline Avoid nephrotoxic agents, strict I&O monitoring, BMP daily, and renally dose medications  7 chronic diabetes mellitus type 2 Sliding scale insulin with Accu-Cheks per routine  Full code Condition stable  Prognosis fair DVT prophylaxis with heparin subcu Disposition home in 2-3 days barring any complications   All the records are reviewed and case discussed with ED provider. Management plans discussed with the patient, family and they are in agreement.  CODE STATUS: Code Status History    This patient does not have a recorded code status.  Please follow your organizational policy for patients in this situation.       TOTAL TIME TAKING CARE OF THIS PATIENT: 40 minutes.    Avel Peace Meir [place] M.D on 04/09/2017   Between 7am to 6pm - Pager - [phone]  After 6pm go to [email or web address] - password [email or web address] [facility]  Office  [phone]  CC: Primary care physician; Jerrol Banana., MD   Note: This dictation was prepared with Dragon dictation along with smaller phrase technology. Any transcriptional errors that result from this process are unintentional.

## 2017-04-09 NOTE — ED Notes (Signed)
Pt placed on NRB 100% at this time.

## 2017-04-09 NOTE — ED Notes (Signed)
Dr Dineen Kid at bedside to review results and admission process

## 2017-04-10 ENCOUNTER — Encounter: Payer: Self-pay | Admitting: Pulmonary Disease

## 2017-04-10 ENCOUNTER — Other Ambulatory Visit: Payer: Self-pay

## 2017-04-10 LAB — GLUCOSE, CAPILLARY
GLUCOSE-CAPILLARY: 174 mg/dL — AB (ref 65–99)
GLUCOSE-CAPILLARY: 132 mg/dL — AB (ref 65–99)
Glucose-Capillary: 138 mg/dL — ABNORMAL HIGH (ref 65–99)
Glucose-Capillary: 165 mg/dL — ABNORMAL HIGH (ref 65–99)
Glucose-Capillary: 253 mg/dL — ABNORMAL HIGH (ref 65–99)

## 2017-04-10 LAB — STREP PNEUMONIAE URINARY ANTIGEN: STREP PNEUMO URINARY ANTIGEN: NEGATIVE

## 2017-04-10 LAB — MRSA PCR SCREENING: MRSA BY PCR: NEGATIVE

## 2017-04-10 LAB — EXPECTORATED SPUTUM ASSESSMENT W GRAM STAIN, RFLX TO RESP C

## 2017-04-10 LAB — EXPECTORATED SPUTUM ASSESSMENT W REFEX TO RESP CULTURE

## 2017-04-10 MED ORDER — DORZOLAMIDE HCL 2 % OP SOLN
1.0000 [drp] | Freq: Three times a day (TID) | OPHTHALMIC | Status: DC
  Administered 2017-04-11 – 2017-04-13 (×6): 1 [drp] via OPHTHALMIC
  Filled 2017-04-10 (×3): qty 10

## 2017-04-10 MED ORDER — DEXTROSE 5 % IV SOLN: 500.0000 mg | INTRAVENOUS | Status: DC

## 2017-04-10 MED ORDER — LATANOPROST 0.005 % OP SOLN
1.0000 [drp] | Freq: Every day | OPHTHALMIC | Status: DC
  Administered 2017-04-11 – 2017-04-12 (×2): 1 [drp] via OPHTHALMIC
  Filled 2017-04-10: qty 2.5

## 2017-04-10 MED ORDER — CHLORHEXIDINE GLUCONATE 0.12 % MT SOLN
15.0000 mL | Freq: Two times a day (BID) | OROMUCOSAL | Status: DC
  Administered 2017-04-10 – 2017-04-13 (×7): 15 mL via OROMUCOSAL
  Filled 2017-04-10 (×7): qty 15

## 2017-04-10 MED ORDER — DEXTROSE 5 % IV SOLN: 1.0000 g | INTRAVENOUS | Status: DC

## 2017-04-10 MED ORDER — SODIUM CHLORIDE 0.9 % IV SOLN: 250.0000 mL | INTRAVENOUS | Status: DC | PRN

## 2017-04-10 MED ORDER — SODIUM CHLORIDE 0.9% FLUSH: 3.0000 mL | INTRAVENOUS | Status: DC | PRN

## 2017-04-10 MED ORDER — FUROSEMIDE 10 MG/ML IJ SOLN
80.0000 mg | Freq: Two times a day (BID) | INTRAMUSCULAR | Status: DC
  Administered 2017-04-10 (×2): 80 mg via INTRAVENOUS
  Filled 2017-04-10 (×2): qty 8

## 2017-04-10 MED ORDER — ALPRAZOLAM 0.25 MG PO TABS
0.2500 mg | ORAL_TABLET | Freq: Two times a day (BID) | ORAL | Status: DC | PRN
  Administered 2017-04-10 – 2017-04-12 (×5): 0.25 mg via ORAL
  Filled 2017-04-10 (×5): qty 1

## 2017-04-10 MED ORDER — SODIUM CHLORIDE 0.9% FLUSH
3.0000 mL | Freq: Two times a day (BID) | INTRAVENOUS | Status: DC
  Administered 2017-04-10 – 2017-04-13 (×8): 3 mL via INTRAVENOUS

## 2017-04-10 MED ORDER — SODIUM POLYSTYRENE SULFONATE 15 GM/60ML PO SUSP
15.0000 g | Freq: Once | ORAL | Status: AC
  Administered 2017-04-10: 15 g via ORAL
  Filled 2017-04-10: qty 60

## 2017-04-10 MED ORDER — HEPARIN SODIUM (PORCINE) 5000 UNIT/ML IJ SOLN
5000.0000 [IU] | Freq: Three times a day (TID) | INTRAMUSCULAR | Status: DC
  Administered 2017-04-10 – 2017-04-13 (×10): 5000 [IU] via SUBCUTANEOUS
  Filled 2017-04-10 (×11): qty 1

## 2017-04-10 MED ORDER — ORAL CARE MOUTH RINSE: 15.0000 mL | Freq: Two times a day (BID) | OROMUCOSAL | Status: DC

## 2017-04-10 MED ORDER — DORZOLAMIDE HCL 2 % OP SOLN
1.0000 [drp] | Freq: Three times a day (TID) | OPHTHALMIC | Status: DC
  Filled 2017-04-10: qty 10

## 2017-04-10 MED ORDER — DEXTROSE 5 % IV SOLN
1.0000 g | INTRAVENOUS | Status: DC
  Administered 2017-04-10 – 2017-04-13 (×4): 1 g via INTRAVENOUS
  Filled 2017-04-10 (×5): qty 10

## 2017-04-10 NOTE — Progress Notes (Signed)
Patient ID: Lori Stanley, female   DOB: August 08, 1931, 83 y.o.   MRN: [id number]  [facility] PROGRESS NOTE  Lori Stanley LPF:[id number] DOB: 1931-12-28 DOA: 04/09/2017 PCP: Jerrol Banana., MD  HPI/Subjective: Patient seen earlier while on BiPAP.  She thinks she was breathing a little bit better.  Some cough last night but less today.  Objective: Vitals:   04/10/17 1400 04/10/17 1500  BP: (!) 146/65 (!) 149/70  Pulse: 91 94  Resp: (!) 22 (!) 23  Temp: 98.7 F (37.1 C)   SpO2: 99% 95%    Filed Weights   04/09/17 1842 04/10/17 0134  Weight: 70.3 kg (155 lb) 76.9 kg (169 lb 8.5 oz)    ROS: Review of Systems  Constitutional: Negative for chills and fever.  Eyes: Negative for blurred vision.  Respiratory: Negative for cough and shortness of breath.   Cardiovascular: Negative for chest pain.  Gastrointestinal: Negative for abdominal pain, constipation, diarrhea, nausea and vomiting.  Genitourinary: Negative for dysuria.  Musculoskeletal: Negative for joint pain.  Neurological: Negative for dizziness and headaches.   Exam: Physical Exam  Constitutional: She is oriented to person, place, and time.  HENT:  Nose: No mucosal edema.  Mouth/Throat: No oropharyngeal exudate or posterior oropharyngeal edema.  Eyes: Conjunctivae, EOM and lids are normal. Pupils are equal, round, and reactive to light.  Neck: No JVD present. Carotid bruit is not present. No edema present. No thyroid mass and no thyromegaly present.  Cardiovascular: S1 normal and S2 normal. Exam reveals no gallop.  No murmur heard. Pulses:      Dorsalis pedis pulses are 2+ on the right side, and 2+ on the left side.  Respiratory: No respiratory distress. She has decreased breath sounds in the right middle field, the right lower field, the left middle field and the left lower field. She has no wheezes. She has no rhonchi. She has no rales.  GI: Soft. Bowel sounds are normal. There is no tenderness.   Musculoskeletal:       Right ankle: She exhibits no swelling.       Left ankle: She exhibits no swelling.  Lymphadenopathy:    She has no cervical adenopathy.  Neurological: She is alert and oriented to person, place, and time. No cranial nerve deficit.  Skin: Skin is warm. Nails show no clubbing.  Chronic lower extremity discoloration  Psychiatric: She has a normal mood and affect.      Data Reviewed: Basic Metabolic Panel: Recent Labs  Lab 04/09/17 1850  NA 138  K 5.5*  CL 104  CO2 26  GLUCOSE 107*  BUN 48*  CREATININE 2.92*  CALCIUM 8.6*   CBC: Recent Labs  Lab 04/09/17 1850  WBC 5.9  NEUTROABS 4.5  HGB 9.5*  HCT 29.5*  MCV 85.6  PLT 202   Cardiac Enzymes: Recent Labs  Lab 04/09/17 1850  TROPONINI 0.06*   BNP (last 3 results) Recent Labs    04/09/17 1850  BNP 505.0*     CBG: Recent Labs  Lab 04/10/17 0138 04/10/17 0754 04/10/17 1129  GLUCAP 138* 174* 165*    Recent Results (from the past 240 hour(s))  Culture, blood (routine x 2) Call MD if unable to obtain prior to antibiotics being given     Status: None (Preliminary result)   Collection Time: 04/09/17  6:50 PM  Result Value Ref Range Status   Specimen Description BLOOD LEFT ANTECUBITAL  Final   Special Requests   Final  BOTTLES DRAWN AEROBIC AND ANAEROBIC Blood Culture adequate volume   Culture NO GROWTH < 12 HOURS  Final   Report Status PENDING  Incomplete  Culture, blood (routine x 2) Call MD if unable to obtain prior to antibiotics being given     Status: None (Preliminary result)   Collection Time: 04/09/17  6:50 PM  Result Value Ref Range Status   Specimen Description BLOOD LEFT ARM  Final   Special Requests   Final    BOTTLES DRAWN AEROBIC AND ANAEROBIC Blood Culture adequate volume   Culture NO GROWTH < 12 HOURS  Final   Report Status PENDING  Incomplete  MRSA PCR Screening     Status: None   Collection Time: 04/10/17  2:06 AM  Result Value Ref Range Status   MRSA by  PCR NEGATIVE NEGATIVE Final    Comment:        The GeneXpert MRSA Assay (FDA approved for NASAL specimens only), is one component of a comprehensive MRSA colonization surveillance program. It is not intended to diagnose MRSA infection nor to guide or monitor treatment for MRSA infections.   Culture, sputum-assessment     Status: None   Collection Time: 04/10/17  3:33 AM  Result Value Ref Range Status   Specimen Description EXPECTORATED SPUTUM  Final   Special Requests NONE  Final   Sputum evaluation   Final    Sputum specimen not acceptable for testing.  Please recollect.   C/BRITTON LEE RUST CHESTER AT 0420 04/10/17.PMH   Report Status 04/10/2017 FINAL  Final     Studies: Dg Chest Portable 1 View  Result Date: 04/09/2017 CLINICAL DATA:  Progressive shortness of breath over the last few days. Increasing lower extremity swelling. Hypoxia. EXAM: PORTABLE CHEST 1 VIEW COMPARISON:  Two-view chest x-ray 07/18/2013 FINDINGS: The heart is enlarged. Left lower lobe airspace disease is present. There is minimal atelectasis at the right base with chronic elevation of the right hemidiaphragm. Mild pulmonary vascular congestion is noted. The visualized soft tissues and bony thorax are unremarkable. IMPRESSION: 1. New left lower lobe pneumonia. 2. Minimal right basilar atelectasis. 3. Cardiomegaly with mild pulmonary vascular congestion. Electronically Signed   By: San Morelle M.D.   On: 04/09/2017 19:09    Scheduled Meds: . chlorhexidine  15 mL Mouth Rinse BID  . [START ON 04/11/2017] dorzolamide  1 drop Both Eyes TID  . furosemide  80 mg Intravenous BID  . heparin  5,000 Units Subcutaneous Q8H  . insulin aspart  0-20 Units Subcutaneous TID WC  . insulin aspart  0-5 Units Subcutaneous QHS  . latanoprost  1 drop Left Eye QHS  . mouth rinse  15 mL Mouth Rinse q12n4p  . patiromer  8.4 g Oral Daily  . sodium chloride flush  3 mL Intravenous Q12H   Continuous Infusions: . sodium  chloride    . azithromycin    . cefTRIAXone (ROCEPHIN)  IV Stopped (04/10/17 1316)    Assessment/Plan:  1. Acute hypoxic respiratory failure.  Patient was on BiPAP when I saw her and looks like tapered over to 4 L nasal cannula this afternoon. 2. Acute diastolic congestive heart failure.  Patient on high-dose IV Lasix. 3. Pneumonia left lower lobe on Rocephin and Zithromax. 4. Chronic kidney disease stage IV.  Watch closely with diuresis. 5. Hyperkalemia patient given a dose of Kayexalate and also on Veltassa  Code Status:     Code Status Orders  (From admission, onward)  Start     Ordered   04/10/17 0141  Full code  Continuous     04/10/17 0140    Code Status History    Date Active Date Inactive Code Status Order ID Comments User Context   This patient has a current code status but no historical code status.     Family Communication: As per critical care specialist Disposition Plan: To be determined  Consultants:  Critical care specialist  Antibiotics:  Rocephin  Zithromax  Time spent: 28 minutes  [place]

## 2017-04-10 NOTE — Consult Note (Signed)
[facility] Consultation    ASSESSMENT/PLAN   Respiratory distress. Clinically consistent with congestive heart failure, elevated BNP, chest x-ray shows pulmonary edema pattern, history of ischemic cardiac disease. Agree with noninvasive ventilation, diuretic therapy, EKG and cardiac enzymes along with echocardiography. Patient being empirically treated for community-acquired pneumonia with azithromycin and Rocephin along with as needed rescue bronchodilator therapy. We'll clinically consistent with congestive heart failure  Renal insufficiency. BUN is 48 and creatinine 2.92, will follow closely, if does not improve will order renal ultrasound, bladder scan [name] support hemodynamics and avoid nephrotoxic agents  Hyperkalemia, will repeat BMP.   Patient with severe respiratory failure on BiPAP patient with renal failure as well overall multiorgan failure from CHF and pneumonia   attempt to wean BiPAP as tolerated   Patient is critically ill, high risk for intubation and cardiac arrest    Name: Lori Stanley MRN: [id number] DOB: 03-Sep-1931    ADMISSION DATE:  04/09/2017 CONSULTATION DATE:  04/09/2017  REFERRING MD :  Hospitalist  CHIEF COMPLAINT:  Shortness of breath   HISTORY OF PRESENT ILLNESS:  Patient is lethargic patient remains on BiPAP High risk for intubation cardiac arrest   REVIEW OF SYSTEMS:    The remainder of systems were reviewed and were found to be negative other than what is documented in the HPI.    VITAL SIGNS: Temp:  [97.4 F (36.3 C)-98.2 F (36.8 C)] 97.4 F (36.3 C) (12/10 0134) Pulse Rate:  [61-95] 75 (12/10 0600) Resp:  [13-32] 17 (12/10 0600) BP: (122-179)/(54-90) 131/59 (12/10 0600) SpO2:  [88 %-100 %] 96 % (12/10 0600) FiO2 (%):  [40 %-100 %] 40 % (12/10 0134) Weight:  [155 lb (70.3 kg)-169 lb 8.5 oz (76.9 kg)] 169 lb 8.5 oz (76.9 kg) (12/10 0134) HEMODYNAMICS:   VENTILATOR SETTINGS: FiO2 (%):  [40 %-100 %]  40 % INTAKE / OUTPUT:  Intake/Output Summary (Last 24 hours) at 04/10/2017 0808 Last data filed at 04/10/2017 0200 Gross per 24 hour  Intake 6 ml  Output 225 ml  Net -219 ml    Physical Examination:   VS: BP (!) 131/59   Pulse 75   Temp (!) 97.4 F (36.3 C) (Axillary)   Resp 17   Ht 5\' 3"  (1.6 m)   Wt 169 lb 8.5 oz (76.9 kg)   SpO2 96%   BMI 30.03 kg/m   General Appearance: +distress on biPAP Neuro:without focal findings, mental status, speech normal,. HEENT: Presently on BiPAP, trachea is midline, no stridor or accessory muscle utilization, patient resting comfortably  Pulmonary: Bilateral crackles appreciated posteriorly lung zones Cardiovascular regular rate and rhythm, systolic murmur left parasternal border    Abdomen: Benign, Soft, non-tender, No masses, hepatosplenomegaly, No lymphadenopathy Renal:  No costovertebral tenderness  Extremities: Extensive lower extremity edema with some erythema, chronic venous stasis changes, loss of hair   LABS: Reviewed   LABORATORY PANEL:   CBC Recent Labs  Lab 04/09/17 1850  WBC 5.9  HGB 9.5*  HCT 29.5*  PLT 202    Chemistries  Recent Labs  Lab 04/09/17 1850  NA 138  K 5.5*  CL 104  CO2 26  GLUCOSE 107*  BUN 48*  CREATININE 2.92*  CALCIUM 8.6*    Recent Labs  Lab 04/10/17 0138 04/10/17 0754  GLUCAP 138* 174*     Critical Care Time devoted to patient care services described in this note is 37 minutes.   Overall, patient is critically ill, prognosis is guarded.  Patient with  Multiorgan failure and at high risk for cardiac arrest and death.    Corrin Parker, M.D.  [facility]  Medical Director [place] Director [facility] Cardio-Pulmonary Department

## 2017-04-10 NOTE — Progress Notes (Signed)
eLink Physician-Brief Progress Note Patient Name: Lori Stanley DOB: 04-Aug-1931 MRN: [id number]   Date of Service  04/10/2017  HPI/Events of Note  83 year old African-American female with a past medical history remarkable for hypertension, hyperlipidemia, diabetes, ischemic cardiac disease, congestive heart failure presents with 3 days of progressive increasing lower extremity swelling, aggressive dyspnea, PND and orthopnea, presented to the emergency department in respiratory distress requiring positive pressure. PCCM has already seen patient in consultation and assumed care while in the ICU. VSS.    eICU Interventions  No new orders.     Intervention Category Evaluation Type: New Patient Evaluation  Mehran Guderian Eugene 04/10/2017, 1:51 AM

## 2017-04-11 ENCOUNTER — Inpatient Hospital Stay (HOSPITAL_COMMUNITY)
Admit: 2017-04-11 | Discharge: 2017-04-11 | Disposition: A | Discharge status: Home / Self Care | Payer: Medicare Other | Attending: Internal Medicine | Admitting: Internal Medicine

## 2017-04-11 DIAGNOSIS — I340 Nonrheumatic mitral (valve) insufficiency: Secondary | ICD-10-CM

## 2017-04-11 LAB — CBC WITH DIFFERENTIAL/PLATELET
BASOS ABS: 0 10*3/uL (ref 0–0.1)
BASOS PCT: 0 %
EOS ABS: 0 10*3/uL (ref 0–0.7)
EOS PCT: 0 %
HCT: 27 % — ABNORMAL LOW (ref 35.0–47.0)
Hemoglobin: 8.7 g/dL — ABNORMAL LOW (ref 12.0–16.0)
LYMPHS PCT: 11 %
Lymphs Abs: 0.7 10*3/uL — ABNORMAL LOW (ref 1.0–3.6)
MCH: 27.3 pg (ref 26.0–34.0)
MCHC: 32.1 g/dL (ref 32.0–36.0)
MCV: 85.1 fL (ref 80.0–100.0)
Monocytes Absolute: 0.7 10*3/uL (ref 0.2–0.9)
Monocytes Relative: 11 %
Neutro Abs: 5 10*3/uL (ref 1.4–6.5)
Neutrophils Relative %: 78 %
PLATELETS: 210 10*3/uL (ref 150–440)
RBC: 3.17 MIL/uL — AB (ref 3.80–5.20)
RDW: 14.7 % — ABNORMAL HIGH (ref 11.5–14.5)
WBC: 6.4 10*3/uL (ref 3.6–11.0)

## 2017-04-11 LAB — GLUCOSE, CAPILLARY
GLUCOSE-CAPILLARY: 103 mg/dL — AB (ref 65–99)
GLUCOSE-CAPILLARY: 171 mg/dL — AB (ref 65–99)
Glucose-Capillary: 151 mg/dL — ABNORMAL HIGH (ref 65–99)
Glucose-Capillary: 142 mg/dL — ABNORMAL HIGH (ref 65–99)

## 2017-04-11 LAB — BASIC METABOLIC PANEL
Anion gap: 10 (ref 5–15)
BUN: 57 mg/dL — AB (ref 6–20)
CALCIUM: 8.7 mg/dL — AB (ref 8.9–10.3)
CO2: 28 mmol/L (ref 22–32)
Chloride: 103 mmol/L (ref 101–111)
Creatinine, Ser: 3.05 mg/dL — ABNORMAL HIGH (ref 0.44–1.00)
GFR calc Af Amer: 15 mL/min — ABNORMAL LOW (ref >60)
GFR, EST NON AFRICAN AMERICAN: 13 mL/min — AB (ref >60)
Glucose, Bld: 117 mg/dL — ABNORMAL HIGH (ref 65–99)
POTASSIUM: 4.4 mmol/L (ref 3.5–5.1)
SODIUM: 141 mmol/L (ref 135–145)

## 2017-04-11 LAB — HIV ANTIBODY (ROUTINE TESTING W REFLEX): HIV SCREEN 4TH GENERATION: NONREACTIVE

## 2017-04-11 MED ORDER — FUROSEMIDE 10 MG/ML IJ SOLN
INTRAMUSCULAR | Status: AC
  Filled 2017-04-11: qty 4

## 2017-04-11 MED ORDER — FUROSEMIDE 10 MG/ML IJ SOLN: 40.0000 mg | Freq: Every day | INTRAMUSCULAR | Status: DC

## 2017-04-11 MED ORDER — FUROSEMIDE 10 MG/ML IJ SOLN
40.0000 mg | Freq: Every day | INTRAMUSCULAR | Status: DC
  Administered 2017-04-11 – 2017-04-13 (×3): 40 mg via INTRAVENOUS
  Filled 2017-04-11 (×2): qty 4

## 2017-04-11 MED ORDER — AMLODIPINE BESYLATE 5 MG PO TABS
5.0000 mg | ORAL_TABLET | Freq: Every day | ORAL | Status: DC
  Administered 2017-04-11 – 2017-04-13 (×3): 5 mg via ORAL
  Filled 2017-04-11 (×3): qty 1

## 2017-04-11 MED ORDER — CARVEDILOL 3.125 MG PO TABS
12.5000 mg | ORAL_TABLET | Freq: Two times a day (BID) | ORAL | Status: DC
  Administered 2017-04-11 – 2017-04-13 (×5): 12.5 mg via ORAL
  Filled 2017-04-11: qty 4
  Filled 2017-04-11: qty 1
  Filled 2017-04-11 (×3): qty 4

## 2017-04-11 NOTE — Progress Notes (Signed)
[facility]  ROUNDING NOTE   Subjective:  Patient well-known to [place].  We recently saw her as an outpatient for chronic kidney disease stage IV. Her baseline creatinine is 2.86 with an EGFR of 17. She presents now with heart failure exacerbation. She was started on Lasix 80 mg IV every 12 hours. She was initially on BiPAP but now has been transitioned to nasal cannula. She will be transition to floor care today. Good urine output of 3.2 L noted yesterday.   Objective:  Vital signs in last 24 hours:  Temp:  [97.9 F (36.6 C)-98.7 F (37.1 C)] 97.9 F (36.6 C) (12/11 0400) Pulse Rate:  [75-116] 82 (12/11 1000) Resp:  [15-36] 22 (12/11 1000) BP: (138-175)/(62-82) 172/72 (12/11 1000) SpO2:  [85 %-99 %] 96 % (12/11 1000)  Weight change:  Filed Weights   04/09/17 1842 04/10/17 0134  Weight: 70.3 kg (155 lb) 76.9 kg (169 lb 8.5 oz)    Intake/Output: I/O last 3 completed shifts: In: 256 [I.V.:6; IV Piggyback:250] Out: 6734 [Urine:3425]   Intake/Output this shift:  Total I/O In: -  Out: 900 [Urine:900]  Physical Exam: General: No acute distress  Head: Normocephalic, atraumatic. Moist oral mucosal membranes  Eyes: Anicteric  Neck: Supple, trachea midline  Lungs:   Basilar rales, normal effort  Heart: S1S2 no rubs  Abdomen:  Soft, nontender, bowel sounds present  Extremities: 2+ peripheral edema.  Neurologic: Awake, alert, following commands  Skin: No lesions       Basic Metabolic Panel: Recent Labs  Lab 04/09/17 1850 04/11/17 0441  NA 138 141  K 5.5* 4.4  CL 104 103  CO2 26 28  GLUCOSE 107* 117*  BUN 48* 57*  CREATININE 2.92* 3.05*  CALCIUM 8.6* 8.7*    Liver Function Tests: No results for input(s): AST, ALT, ALKPHOS, BILITOT, PROT, ALBUMIN in the last 168 hours. No results for input(s): LIPASE, AMYLASE in the last 168 hours. No results for input(s): AMMONIA in the last 168 hours.  CBC: Recent Labs  Lab 04/09/17 1850 04/11/17 0441  WBC  5.9 6.4  NEUTROABS 4.5 5.0  HGB 9.5* 8.7*  HCT 29.5* 27.0*  MCV 85.6 85.1  PLT 202 210    Cardiac Enzymes: Recent Labs  Lab 04/09/17 1850  TROPONINI 0.06*    BNP: Invalid input(s): POCBNP  CBG: Recent Labs  Lab 04/10/17 0754 04/10/17 1129 04/10/17 1614 04/10/17 2041 04/11/17 0750  GLUCAP 174* 165* 132* 253* 103*    Microbiology: Results for orders placed or performed during the hospital encounter of 04/09/17  Culture, blood (routine x 2) Call MD if unable to obtain prior to antibiotics being given     Status: None (Preliminary result)   Collection Time: 04/09/17  6:50 PM  Result Value Ref Range Status   Specimen Description BLOOD LEFT ANTECUBITAL  Final   Special Requests   Final    BOTTLES DRAWN AEROBIC AND ANAEROBIC Blood Culture adequate volume   Culture NO GROWTH 1 DAY  Final   Report Status PENDING  Incomplete  Culture, blood (routine x 2) Call MD if unable to obtain prior to antibiotics being given     Status: None (Preliminary result)   Collection Time: 04/09/17  6:50 PM  Result Value Ref Range Status   Specimen Description BLOOD LEFT ARM  Final   Special Requests   Final    BOTTLES DRAWN AEROBIC AND ANAEROBIC Blood Culture adequate volume   Culture NO GROWTH 1 DAY  Final   Report  Status PENDING  Incomplete  MRSA PCR Screening     Status: None   Collection Time: 04/10/17  2:06 AM  Result Value Ref Range Status   MRSA by PCR NEGATIVE NEGATIVE Final    Comment:        The GeneXpert MRSA Assay (FDA approved for NASAL specimens only), is one component of a comprehensive MRSA colonization surveillance program. It is not intended to diagnose MRSA infection nor to guide or monitor treatment for MRSA infections.   Culture, sputum-assessment     Status: None   Collection Time: 04/10/17  3:33 AM  Result Value Ref Range Status   Specimen Description EXPECTORATED SPUTUM  Final   Special Requests NONE  Final   Sputum evaluation   Final    Sputum specimen  not acceptable for testing.  Please recollect.   C/BRITTON LEE RUST CHESTER AT 0420 04/10/17.PMH   Report Status 04/10/2017 FINAL  Final    Coagulation Studies: No results for input(s): LABPROT, INR in the last 72 hours.  Urinalysis: No results for input(s): COLORURINE, LABSPEC, PHURINE, GLUCOSEU, HGBUR, BILIRUBINUR, KETONESUR, PROTEINUR, UROBILINOGEN, NITRITE, LEUKOCYTESUR in the last 72 hours.  Invalid input(s): APPERANCEUR    Imaging: Dg Chest Portable 1 View  Result Date: 04/09/2017 CLINICAL DATA:  Progressive shortness of breath over the last few days. Increasing lower extremity swelling. Hypoxia. EXAM: PORTABLE CHEST 1 VIEW COMPARISON:  Two-view chest x-ray 07/18/2013 FINDINGS: The heart is enlarged. Left lower lobe airspace disease is present. There is minimal atelectasis at the right base with chronic elevation of the right hemidiaphragm. Mild pulmonary vascular congestion is noted. The visualized soft tissues and bony thorax are unremarkable. IMPRESSION: 1. New left lower lobe pneumonia. 2. Minimal right basilar atelectasis. 3. Cardiomegaly with mild pulmonary vascular congestion. Electronically Signed   By: San Morelle M.D.   On: 04/09/2017 19:09     Medications:   . sodium chloride    . azithromycin Stopped (04/10/17 1958)  . cefTRIAXone (ROCEPHIN)  IV 1 g (04/11/17 1048)   . chlorhexidine  15 mL Mouth Rinse BID  . dorzolamide  1 drop Both Eyes TID  . furosemide  80 mg Intravenous BID  . heparin  5,000 Units Subcutaneous Q8H  . insulin aspart  0-20 Units Subcutaneous TID WC  . insulin aspart  0-5 Units Subcutaneous QHS  . latanoprost  1 drop Left Eye QHS  . mouth rinse  15 mL Mouth Rinse q12n4p  . patiromer  8.4 g Oral Daily  . sodium chloride flush  3 mL Intravenous Q12H   sodium chloride, ALPRAZolam, sodium chloride flush  Assessment/ Plan:  83 y.o. female with diabetes mellitus type II, hypertension, anemia, GERD, depression/anxiety, glaucoma,  hyperlipidemia, diastolic congestive heart failure, coronary artery disease, admitted now with acute respiratory failure secondary to diastolic heart failure.  1.  Acute renal failure/chronic kidney disease stage IV baseline EGFR 17 and creatinine of 2.86.  Patient was recently seen in our office for evaluation management of chronic kidney disease stage IV.  It appears recently her chronic kidney disease has been progressive.  Her creatinine has worsened slightly with the use of Lasix.  Therefore we will reduce Lasix to 40 mg IV daily instead of 80 mg twice daily.  No urgent indication for dialysis however we suspect that her renal function will likely continue to decline.  2.  Acute respiratory failure secondary to acute diastolic heart failure.  Agree with Lasix but we will decrease dose to 40 mg IV daily given  worsening of renal function.  3.  Anemia of chronic kidney disease.  Hemoglobin currently 8.7.  We may need to consider adding Epogen to her medication regimen.  4.  Hyperkalemia.  Maintain the patient on Veltassa 8.4 g p.o. daily.   LOS: 2 [place] Rasch 12/11/201811:10 AM

## 2017-04-11 NOTE — Progress Notes (Signed)
Report called to Jinny Blossom, RN on 1C. Patient transported to room 112 via hospital bed on O2 @ 3 lpm by Adalberto Ill, RN.

## 2017-04-11 NOTE — Progress Notes (Addendum)
Inpatient Diabetes Program Recommendations  AACE/ADA: New Consensus Statement on Inpatient Glycemic Control (2015)  Target Ranges:  Prepandial:   less than 140 mg/dL      Peak postprandial:   less than 180 mg/dL (1-2 hours)      Critically ill patients:  140 - 180 mg/dL   Lab Results  Component Value Date   GLUCAP 103 (H) 04/11/2017   HGBA1C 7.8 (H) 12/26/2016    Review of Glycemic Control  Results for Lori Stanley, Lori Stanley (MRN [id number]) as of 04/11/2017 11:27  Ref. Range 04/10/2017 07:54 04/10/2017 11:29 04/10/2017 16:14 04/10/2017 20:41 04/11/2017 07:50  Glucose-Capillary Latest Ref Range: 65 - 99 mg/dL 174 (H) 165 (H) 132 (H) 253 (H) 103 (H)    Diabetes history: Type 2 Outpatient Diabetes medications: Humalog 75/25 insulin 60 units bid Current orders for Inpatient glycemic control: Novolog 0-20 units tid, Novolog 0-5 units qhs  Inpatient Diabetes Program Recommendations:  Please d/c Novolog 0-20 units tid (high risk for hypoglycemia)  and consider Novolog 0-9 units tid,  Novolog 0-5 units qhs,  Lantus 8 units qhs  Low Hgb- unable to assess A1C at this time.   Spoke to [employer]- encourage to address with Dr. Mortimer Fries.  Gentry Fitz, RN, BA, MHA, CDE Diabetes Coordinator Inpatient Diabetes Program  [phone] (Team Pager) [phone] ([place]) 04/11/2017 11:31 AM

## 2017-04-11 NOTE — Progress Notes (Signed)
Patient ID: Lori Stanley, female   DOB: 30-May-1931, 83 y.o.   MRN: [id number]  [facility] PROGRESS NOTE  CURTINA GRILLS PJK:[id number] DOB: Sep 21, 1931 DOA: 04/09/2017 PCP: Jerrol Banana., MD  HPI/Subjective: Patient feeling better. Some cough and some shortness of breath.  Objective: Vitals:   04/11/17 1200 04/11/17 1438  BP: (!) 193/91 (!) 170/74  Pulse: (!) 108 91  Resp: (!) 35   Temp:    SpO2: 94%     Filed Weights   04/09/17 1842 04/10/17 0134  Weight: 70.3 kg (155 lb) 76.9 kg (169 lb 8.5 oz)    ROS: Review of Systems  Constitutional: Negative for chills and fever.  Eyes: Negative for blurred vision.  Respiratory: Positive for cough and shortness of breath.   Cardiovascular: Negative for chest pain.  Gastrointestinal: Negative for abdominal pain, constipation, diarrhea, nausea and vomiting.  Genitourinary: Negative for dysuria.  Musculoskeletal: Negative for joint pain.  Neurological: Negative for dizziness and headaches.   Exam: Physical Exam  Constitutional: She is oriented to person, place, and time.  HENT:  Nose: No mucosal edema.  Mouth/Throat: No oropharyngeal exudate or posterior oropharyngeal edema.  Eyes: Conjunctivae, EOM and lids are normal. Pupils are equal, round, and reactive to light.  Neck: No JVD present. Carotid bruit is not present. No edema present. No thyroid mass and no thyromegaly present.  Cardiovascular: S1 normal and S2 normal. Exam reveals no gallop.  No murmur heard. Pulses:      Dorsalis pedis pulses are 2+ on the right side, and 2+ on the left side.  Respiratory: No respiratory distress. She has decreased breath sounds in the right middle field, the right lower field, the left middle field and the left lower field. She has no wheezes. She has no rhonchi. She has no rales.  GI: Soft. Bowel sounds are normal. There is no tenderness.  Musculoskeletal:       Right ankle: She exhibits no swelling.       Left ankle:  She exhibits no swelling.  Lymphadenopathy:    She has no cervical adenopathy.  Neurological: She is alert and oriented to person, place, and time. No cranial nerve deficit.  Skin: Skin is warm. Nails show no clubbing.  Chronic lower extremity discoloration  Psychiatric: She has a normal mood and affect.      Data Reviewed: Basic Metabolic Panel: Recent Labs  Lab 04/09/17 1850 04/11/17 0441  NA 138 141  K 5.5* 4.4  CL 104 103  CO2 26 28  GLUCOSE 107* 117*  BUN 48* 57*  CREATININE 2.92* 3.05*  CALCIUM 8.6* 8.7*   CBC: Recent Labs  Lab 04/09/17 1850 04/11/17 0441  WBC 5.9 6.4  NEUTROABS 4.5 5.0  HGB 9.5* 8.7*  HCT 29.5* 27.0*  MCV 85.6 85.1  PLT 202 210   Cardiac Enzymes: Recent Labs  Lab 04/09/17 1850  TROPONINI 0.06*   BNP (last 3 results) Recent Labs    04/09/17 1850  BNP 505.0*     CBG: Recent Labs  Lab 04/10/17 1129 04/10/17 1614 04/10/17 2041 04/11/17 0750 04/11/17 1117  GLUCAP 165* 132* 253* 103* 171*    Recent Results (from the past 240 hour(s))  Culture, blood (routine x 2) Call MD if unable to obtain prior to antibiotics being given     Status: None (Preliminary result)   Collection Time: 04/09/17  6:50 PM  Result Value Ref Range Status   Specimen Description BLOOD LEFT ANTECUBITAL  Final   Special  Requests   Final    BOTTLES DRAWN AEROBIC AND ANAEROBIC Blood Culture adequate volume   Culture NO GROWTH 1 DAY  Final   Report Status PENDING  Incomplete  Culture, blood (routine x 2) Call MD if unable to obtain prior to antibiotics being given     Status: None (Preliminary result)   Collection Time: 04/09/17  6:50 PM  Result Value Ref Range Status   Specimen Description BLOOD LEFT ARM  Final   Special Requests   Final    BOTTLES DRAWN AEROBIC AND ANAEROBIC Blood Culture adequate volume   Culture NO GROWTH 1 DAY  Final   Report Status PENDING  Incomplete  MRSA PCR Screening     Status: None   Collection Time: 04/10/17  2:06 AM   Result Value Ref Range Status   MRSA by PCR NEGATIVE NEGATIVE Final    Comment:        The GeneXpert MRSA Assay (FDA approved for NASAL specimens only), is one component of a comprehensive MRSA colonization surveillance program. It is not intended to diagnose MRSA infection nor to guide or monitor treatment for MRSA infections.   Culture, sputum-assessment     Status: None   Collection Time: 04/10/17  3:33 AM  Result Value Ref Range Status   Specimen Description EXPECTORATED SPUTUM  Final   Special Requests NONE  Final   Sputum evaluation   Final    Sputum specimen not acceptable for testing.  Please recollect.   C/BRITTON LEE RUST [place] AT 0420 04/10/17.PMH   Report Status 04/10/2017 FINAL  Final     Studies: Dg Chest Portable 1 View  Result Date: 04/09/2017 CLINICAL DATA:  Progressive shortness of breath over the last few days. Increasing lower extremity swelling. Hypoxia. EXAM: PORTABLE CHEST 1 VIEW COMPARISON:  Two-view chest x-ray 07/18/2013 FINDINGS: The heart is enlarged. Left lower lobe airspace disease is present. There is minimal atelectasis at the right base with chronic elevation of the right hemidiaphragm. Mild pulmonary vascular congestion is noted. The visualized soft tissues and bony thorax are unremarkable. IMPRESSION: 1. New left lower lobe pneumonia. 2. Minimal right basilar atelectasis. 3. Cardiomegaly with mild pulmonary vascular congestion. Electronically Signed   By: San Morelle M.D.   On: 04/09/2017 19:09    Scheduled Meds: . amLODipine  5 mg Oral Daily  . carvedilol  12.5 mg Oral BID  . chlorhexidine  15 mL Mouth Rinse BID  . dorzolamide  1 drop Both Eyes TID  . furosemide  40 mg Intravenous Daily  . heparin  5,000 Units Subcutaneous Q8H  . insulin aspart  0-20 Units Subcutaneous TID WC  . insulin aspart  0-5 Units Subcutaneous QHS  . latanoprost  1 drop Left Eye QHS  . patiromer  8.4 g Oral Daily  . sodium chloride flush  3 mL Intravenous  Q12H   Continuous Infusions: . sodium chloride    . azithromycin Stopped (04/10/17 1958)  . cefTRIAXone (ROCEPHIN)  IV Stopped (04/11/17 1118)    Assessment/Plan:  1. Acute hypoxic respiratory failure.  Patient was on BiPAP initially, now on nasal canula 2. Acute diastolic congestive heart failure.  Echocardiogram. Decreased dose of iv lasix. On coreg.  3. Pneumonia left lower lobe on Rocephin and Zithromax. 4. Chronic kidney disease stage IV.  Watch closely with diuresis. Appreciate  Nephrology consult 5. Hyperkalemia on Veltassa 6. Glaucoma on latanoprost 7. Will need physical therapy  Code Status:     Code Status Orders  (From admission, onward)  Start     Ordered   04/10/17 0141  Full code  Continuous     04/10/17 0140    Code Status History    Date Active Date Inactive Code Status Order ID Comments User Context   This patient has a current code status but no historical code status.     Family Communication: As per critical care specialist Disposition Plan: To be determined  Consultants:  Critical care specialist  Antibiotics:  Rocephin  Zithromax  Time spent: 27 minutes  [facility]           Patient ID: JAMEIKA KINN, female   DOB: Aug 14, 1931, 83 y.o.   MRN: [id number]

## 2017-04-12 ENCOUNTER — Ambulatory Visit: Payer: Medicare Other | Admitting: Family Medicine

## 2017-04-12 ENCOUNTER — Ambulatory Visit: Payer: Self-pay

## 2017-04-12 LAB — BASIC METABOLIC PANEL
Anion gap: 7 (ref 5–15)
BUN: 53 mg/dL — AB (ref 6–20)
CALCIUM: 8.9 mg/dL (ref 8.9–10.3)
CO2: 32 mmol/L (ref 22–32)
Chloride: 102 mmol/L (ref 101–111)
Creatinine, Ser: 2.67 mg/dL — ABNORMAL HIGH (ref 0.44–1.00)
GFR calc Af Amer: 18 mL/min — ABNORMAL LOW (ref >60)
GFR, EST NON AFRICAN AMERICAN: 15 mL/min — AB (ref >60)
GLUCOSE: 102 mg/dL — AB (ref 65–99)
Potassium: 4.2 mmol/L (ref 3.5–5.1)
Sodium: 141 mmol/L (ref 135–145)

## 2017-04-12 LAB — GLUCOSE, CAPILLARY
GLUCOSE-CAPILLARY: 255 mg/dL — AB (ref 65–99)
Glucose-Capillary: 111 mg/dL — ABNORMAL HIGH (ref 65–99)
Glucose-Capillary: 157 mg/dL — ABNORMAL HIGH (ref 65–99)
Glucose-Capillary: 120 mg/dL — ABNORMAL HIGH (ref 65–99)

## 2017-04-12 LAB — LEGIONELLA PNEUMOPHILA SEROGP 1 UR AG: L. pneumophila Serogp 1 Ur Ag: NEGATIVE

## 2017-04-12 LAB — ECHOCARDIOGRAM COMPLETE
HEIGHTINCHES: 63 in
WEIGHTICAEL: 2561.6 [oz_av]

## 2017-04-12 MED ORDER — AZITHROMYCIN 500 MG PO TABS
250.0000 mg | ORAL_TABLET | Freq: Every day | ORAL | Status: DC
  Administered 2017-04-12 – 2017-04-13 (×2): 250 mg via ORAL
  Filled 2017-04-12 (×2): qty 1

## 2017-04-12 MED ORDER — IPRATROPIUM-ALBUTEROL 0.5-2.5 (3) MG/3ML IN SOLN
3.0000 mL | Freq: Four times a day (QID) | RESPIRATORY_TRACT | Status: DC
  Administered 2017-04-12 – 2017-04-13 (×5): 3 mL via RESPIRATORY_TRACT
  Filled 2017-04-12 (×5): qty 3

## 2017-04-12 MED ORDER — METHYLPREDNISOLONE SODIUM SUCC 40 MG IJ SOLR
40.0000 mg | Freq: Every day | INTRAMUSCULAR | Status: DC
  Administered 2017-04-12: 16:00:00 40 mg via INTRAVENOUS
  Filled 2017-04-12: qty 1

## 2017-04-12 MED ORDER — BUDESONIDE 0.5 MG/2ML IN SUSP
0.5000 mg | Freq: Two times a day (BID) | RESPIRATORY_TRACT | Status: DC
  Administered 2017-04-12 – 2017-04-13 (×2): 0.5 mg via RESPIRATORY_TRACT
  Filled 2017-04-12 (×2): qty 2

## 2017-04-12 NOTE — Plan of Care (Signed)
  Education: Knowledge of General Education information will improve 04/12/2017 0359 - Progressing by Jeffie Pollock, RN   Health Behavior/Discharge Planning: Ability to manage health-related needs will improve 04/12/2017 0359 - Progressing by Jeffie Pollock, RN   Clinical Measurements: Ability to maintain clinical measurements within normal limits will improve 04/12/2017 0359 - Progressing by Jeffie Pollock, RN Will remain free from infection 04/12/2017 0359 - Progressing by Jeffie Pollock, RN Diagnostic test results will improve 04/12/2017 0359 - Progressing by Jeffie Pollock, RN Respiratory complications will improve 04/12/2017 0359 - Progressing by Jeffie Pollock, RN Cardiovascular complication will be avoided 04/12/2017 0359 - Progressing by Jeffie Pollock, RN    Activity: Risk for activity intolerance will decrease 04/12/2017 0359 - Progressing by Jeffie Pollock, RN   Nutrition: Adequate nutrition will be maintained 04/12/2017 0359 - Progressing by Jeffie Pollock, RN   Coping: Level of anxiety will decrease 04/12/2017 0359 - Progressing by Jeffie Pollock, RN   Elimination: Will not experience complications related to bowel motility 04/12/2017 0359 - Progressing by Jeffie Pollock, RN Will not experience complications related to urinary retention 04/12/2017 0359 - Progressing by Jeffie Pollock, RN   Pain Managment: General experience of comfort will improve 04/12/2017 0359 - Progressing by Jeffie Pollock, RN   Safety: Ability to remain free from injury will improve 04/12/2017 0359 - Progressing by Jeffie Pollock, RN   Skin Integrity: Risk for impaired skin integrity will decrease 04/12/2017 0359 - Progressing by Jeffie Pollock, RN

## 2017-04-12 NOTE — Evaluation (Signed)
Physical Therapy Evaluation Patient Details Name: Lori Stanley MRN: [id number] DOB: 1931/12/01 Today's Date: 04/12/2017   History of Present Illness  Pt admitted for respiratory failure and now with positive pneumonia. History includes HTN, DM, CHF, and MI. Pt complains of B LE swelling x 3 days. Pt currently on 2L of O2 acutely.  Clinical Impression  Pt is a pleasant 83 year old female who was admitted for respiratory failure and pneumonia. Pt performs bed mobility with mod I, transfers with cga, and ambulation with cga and RW. All mobility performed on 2L of O2 with sats quickly decreasing, limiting further ambulation. Pt demonstrates deficits with strength/mobility/endurance. Recommend use of RW for energy conservation and balance at this time. Would benefit from skilled PT to address above deficits and promote optimal return to PLOF. Recommend transition to [place] upon discharge from acute hospitalization.       Follow Up Recommendations Home health PT    Equipment Recommendations  Rolling walker with 5" wheels    Recommendations for Other Services       Precautions / Restrictions Precautions Precautions: Fall Restrictions Weight Bearing Restrictions: No      Mobility  Bed Mobility Overal bed mobility: Modified Independent             General bed mobility comments: safe technique performed with use of bed rails  Transfers Overall transfer level: Needs assistance Equipment used: Rolling walker (2 wheeled) Transfers: Sit to/from Stand Sit to Stand: Min guard         General transfer comment: safe technique performed  Ambulation/Gait Ambulation/Gait assistance: Min guard Ambulation Distance (Feet): 50 Feet Assistive device: Rolling walker (2 wheeled) Gait Pattern/deviations: Step-to pattern     General Gait Details: ambulated with slow step to gait pattern and occasional cues to keep close to RW. All mobility performed on 2L of O2 with sats quickly  decreasing to 86%. Further ambulation distance deferred at this time. Pt with no SOB symptoms, however fatigues. Once seated, sats slowly improve to >90% within a few minutes.  Stairs            Wheelchair Mobility    Modified Rankin (Stroke Patients Only)       Balance Overall balance assessment: Needs assistance Sitting-balance support: Feet supported Sitting balance-Leahy Scale: Good     Standing balance support: Bilateral upper extremity supported Standing balance-Leahy Scale: Good                               Pertinent Vitals/Pain Pain Assessment: No/denies pain    Home Living Family/patient expects to be discharged to:: Private residence Living Arrangements: Alone Available Help at Discharge: Family(will be going to live with her sister) Type of Home: House Home Access: Level entry     Home Layout: One level Home Equipment: Cane - single point      Prior Function Level of Independence: Independent with assistive device(s)         Comments: used SPC at all times, reports no falls.      Hand Dominance        Extremity/Trunk Assessment   Upper Extremity Assessment Upper Extremity Assessment: Generalized weakness(B UE grossly 4/5)    Lower Extremity Assessment Lower Extremity Assessment: Generalized weakness(B LE grossly 4/5)       Communication   Communication: No difficulties  Cognition Arousal/Alertness: Awake/alert Behavior During Therapy: WFL for tasks assessed/performed Overall Cognitive Status: Within Functional Limits for tasks assessed  General Comments      Exercises Other Exercises Other Exercises: Pt ambulated to bathroom with cga for BM. Pt with set up and supervision, however able to perform hygiene safely. Cues for safe transfer on/off commode.   Assessment/Plan    PT Assessment Patient needs continued PT services  PT Problem List Decreased  strength;Decreased activity tolerance;Decreased balance;Cardiopulmonary status limiting activity       PT Treatment Interventions Gait training;DME instruction;Therapeutic exercise;Balance training    PT Goals (Current goals can be found in the Care Plan section)  Acute Rehab PT Goals Patient Stated Goal: to get stronger PT Goal Formulation: With patient Time For Goal Achievement: 04/26/17 Potential to Achieve Goals: Good    Frequency Min 2X/week   Barriers to discharge        Co-evaluation               AM-PAC PT "6 Clicks" Daily Activity  Outcome Measure Difficulty turning over in bed (including adjusting bedclothes, sheets and blankets)?: None Difficulty moving from lying on back to sitting on the side of the bed? : None Difficulty sitting down on and standing up from a chair with arms (e.g., wheelchair, bedside commode, etc,.)?: Unable Help needed moving to and from a bed to chair (including a wheelchair)?: A Little Help needed walking in hospital room?: A Little Help needed climbing 3-5 steps with a railing? : A Little 6 Click Score: 18    End of Session Equipment Utilized During Treatment: Gait belt;Oxygen Activity Tolerance: Patient tolerated treatment well Patient left: in chair;with chair alarm set Nurse Communication: Mobility status PT Visit Diagnosis: Muscle weakness (generalized) (M62.81);Difficulty in walking, not elsewhere classified (R26.2)    Time: 1021-1173 PT Time Calculation (min) (ACUTE ONLY): 28 min   Charges:   PT Evaluation $PT Eval Low Complexity: 1 Low PT Treatments $Therapeutic Activity: 8-22 mins   PT G Codes:   PT G-Codes **NOT FOR INPATIENT CLASS** Functional Assessment Tool Used: AM-PAC 6 Clicks Basic Mobility Functional Limitation: Mobility: Walking and moving around Mobility: Walking and Moving Around Current Status (V6701): At least 40 percent but less than 60 percent impaired, limited or restricted Mobility: Walking and  Moving Around Goal Status [phone]): At least 20 percent but less than 40 percent impaired, limited or restricted    Greggory Stallion, PT, DPT [phone]   Lori Stanley 04/12/2017, 1:36 PM

## 2017-04-12 NOTE — Progress Notes (Signed)
Patient ID: Lori Stanley, female   DOB: [birth year], 83 y.o.   MRN: [id number]   [facility] PROGRESS NOTE  Lori Stanley DGU:[id number] DOB: [birth year] DOA: 04/09/2017 PCP: Jerrol Banana., MD  HPI/Subjective: Patient feeling better.  Still with some cough and shortness of breath.  Nursing staff unable to get her off oxygen today.  Objective: Vitals:   04/12/17 1024 04/12/17 1341  BP:  (!) 157/57  Pulse:  79  Resp:  18  Temp:  98.3 F (36.8 C)  SpO2: 93% 93%    Filed Weights   04/09/17 1842 04/10/17 0134 04/11/17 1500  Weight: 70.3 kg (155 lb) 76.9 kg (169 lb 8.5 oz) 72.6 kg (160 lb 1.6 oz)    ROS: Review of Systems  Constitutional: Negative for chills and fever.  Eyes: Negative for blurred vision.  Respiratory: Positive for cough and shortness of breath.   Cardiovascular: Negative for chest pain.  Gastrointestinal: Negative for abdominal pain, constipation, diarrhea, nausea and vomiting.  Genitourinary: Negative for dysuria.  Musculoskeletal: Negative for joint pain.  Neurological: Negative for dizziness and headaches.   Exam: Physical Exam  Constitutional: She is oriented to person, place, and time.  HENT:  Nose: No mucosal edema.  Mouth/Throat: No oropharyngeal exudate or posterior oropharyngeal edema.  Eyes: Conjunctivae, EOM and lids are normal. Pupils are equal, round, and reactive to light.  Neck: No JVD present. Carotid bruit is not present. No edema present. No thyroid mass and no thyromegaly present.  Cardiovascular: S1 normal and S2 normal. Exam reveals no gallop.  No murmur heard. Pulses:      Dorsalis pedis pulses are 2+ on the right side, and 2+ on the left side.  Respiratory: No respiratory distress. She has decreased breath sounds in the right middle field, the right lower field, the left middle field and the left lower field. She has no wheezes. She has rhonchi in the right lower field and the left lower field. She has no rales.   GI: Soft. Bowel sounds are normal. There is no tenderness.  Musculoskeletal:       Right ankle: She exhibits no swelling.       Left ankle: She exhibits no swelling.  Lymphadenopathy:    She has no cervical adenopathy.  Neurological: She is alert and oriented to person, place, and time. No cranial nerve deficit.  Skin: Skin is warm. Nails show no clubbing.  Chronic lower extremity discoloration  Psychiatric: She has a normal mood and affect.      Data Reviewed: Basic Metabolic Panel: Recent Labs  Lab 04/09/17 1850 04/11/17 0441 04/12/17 0734  NA 138 141 141  K 5.5* 4.4 4.2  CL 104 103 102  CO2 26 28 32  GLUCOSE 107* 117* 102*  BUN 48* 57* 53*  CREATININE 2.92* 3.05* 2.67*  CALCIUM 8.6* 8.7* 8.9   CBC: Recent Labs  Lab 04/09/17 1850 04/11/17 0441  WBC 5.9 6.4  NEUTROABS 4.5 5.0  HGB 9.5* 8.7*  HCT 29.5* 27.0*  MCV 85.6 85.1  PLT 202 210   Cardiac Enzymes: Recent Labs  Lab 04/09/17 1850  TROPONINI 0.06*   BNP (last 3 results) Recent Labs    04/09/17 1850  BNP 505.0*     CBG: Recent Labs  Lab 04/11/17 1117 04/11/17 1705 04/11/17 2118 04/12/17 0731 04/12/17 1120  GLUCAP 171* 151* 142* 111* 157*    Recent Results (from the past 240 hour(s))  Culture, blood (routine x 2) Call MD if unable to  obtain prior to antibiotics being given     Status: None (Preliminary result)   Collection Time: 04/09/17  6:50 PM  Result Value Ref Range Status   Specimen Description BLOOD LEFT ANTECUBITAL  Final   Special Requests   Final    BOTTLES DRAWN AEROBIC AND ANAEROBIC Blood Culture adequate volume   Culture NO GROWTH 2 DAYS  Final   Report Status PENDING  Incomplete  Culture, blood (routine x 2) Call MD if unable to obtain prior to antibiotics being given     Status: None (Preliminary result)   Collection Time: 04/09/17  6:50 PM  Result Value Ref Range Status   Specimen Description BLOOD LEFT ARM  Final   Special Requests   Final    BOTTLES DRAWN AEROBIC  AND ANAEROBIC Blood Culture adequate volume   Culture NO GROWTH 2 DAYS  Final   Report Status PENDING  Incomplete  MRSA PCR Screening     Status: None   Collection Time: 04/10/17  2:06 AM  Result Value Ref Range Status   MRSA by PCR NEGATIVE NEGATIVE Final    Comment:        The GeneXpert MRSA Assay (FDA approved for NASAL specimens only), is one component of a comprehensive MRSA colonization surveillance program. It is not intended to diagnose MRSA infection nor to guide or monitor treatment for MRSA infections.   Culture, sputum-assessment     Status: None   Collection Time: 04/10/17  3:33 AM  Result Value Ref Range Status   Specimen Description EXPECTORATED SPUTUM  Final   Special Requests NONE  Final   Sputum evaluation   Final    Sputum specimen not acceptable for testing.  Please recollect.   C/BRITTON LEE RUST [place] AT 0420 04/10/17.PMH   Report Status 04/10/2017 FINAL  Final      Scheduled Meds: . amLODipine  5 mg Oral Daily  . carvedilol  12.5 mg Oral BID  . chlorhexidine  15 mL Mouth Rinse BID  . dorzolamide  1 drop Both Eyes TID  . furosemide  40 mg Intravenous Daily  . heparin  5,000 Units Subcutaneous Q8H  . insulin aspart  0-20 Units Subcutaneous TID WC  . insulin aspart  0-5 Units Subcutaneous QHS  . latanoprost  1 drop Left Eye QHS  . patiromer  8.4 g Oral Daily  . sodium chloride flush  3 mL Intravenous Q12H   Continuous Infusions: . sodium chloride    . azithromycin Stopped (04/11/17 1832)  . cefTRIAXone (ROCEPHIN)  IV Stopped (04/12/17 1115)    Assessment/Plan:  1. Acute hypoxic respiratory failure.  Patient was on BiPAP initially, now on nasal canula.  Unable to taper off oxygen today.  Poor air entry and nebulizer treatments and give 1 dose of Solu-Medrol 2. Acute diastolic congestive heart failure.  Echocardiogram. Decreased dose of iv lasix. On coreg.  3. Pneumonia left lower lobe on Rocephin and Zithromax. 4. Chronic kidney disease stage  IV.  Watch closely with diuresis. Appreciate  Nephrology consult.  Creatinine improved today 5. Hyperkalemia.  Improved.  Stop Veltassa 6. Glaucoma on latanoprost 7. Physical therapy recommended home with home health.  Code Status:     Code Status Orders  (From admission, onward)        Start     Ordered   04/10/17 0141  Full code  Continuous     04/10/17 0140    Code Status History    Date Active Date Inactive Code Status Order ID  Comments User Context   This patient has a current code status but no historical code status.     Disposition Plan: Home with home health  Consultants:  Critical care specialist  Antibiotics:  Rocephin  Zithromax  Time spent: 25 minutes  [place]

## 2017-04-12 NOTE — Care Management Important Message (Signed)
Important Message  Patient Details  Name: MAKHIYA COBURN MRN: [id number] Date of Birth: Sep 12, 1931   Medicare Important Message Given:  Yes    [name], RN 04/12/2017, 8:17 AM

## 2017-04-12 NOTE — Care Management Note (Addendum)
Case Management Note  Patient Details  Name: Lori Stanley MRN: [id number] Date of Birth: Jan 08, 1932  Subjective/Objective:   Admitted to [facility] with the diagnosis of respiratory failure. Lives alone. Sister is [name]. [phone]).  Last seen Dr. [name] 12/2016. Prescriptions are filled  at [facility] in [place]. Chronic home oxygen x 4 years. Uses 2 liters per nasal cannula as needed.  Oxygen is provided by [facility]. "I only use it when I come to the hospital." No home Health. No skilled facility. Uses a cane to aid in ambulatory skills. Takes care of all basic and instrumental activities of daily living herself, drives. Last fall was 2-3 years ago. Good appetite.  Sister will transport.                  Action/Plans Physical therapy evaluation completed. Recommending Home with Home Health and therapy + rolling walker [name] will provide oxygen tank for home and rolling walker.  Home Health per [facility]. [name] unable to take case. [facility] will be Home Health agency.  Will be going to sister's home for a few days. Encouraged to wear her oxygen continuous. Discussed having a family member move her concentrator to sister's home while she is living with her sister. States sister's husband can [employer] operational [employer].  [name]'s (sister) address is [place]    Telephone number [phone]    Expected Discharge Date:                  Expected Discharge Plan:     In-House Referral:     Discharge planning Services     Post Acute Care Choice:    Choice offered to:     DME Arranged:    DME Agency:     HH Arranged:    HH Agency:     Status of Service:     If discussed at [place] of Stay Meetings, dates discussed:    Additional Comments:  [name], RN MSN [facility] [phone] 04/12/2017, 11:02 AM

## 2017-04-12 NOTE — Plan of Care (Signed)
Weaned O2 today to RA. Noted O2 sats down to 86% at rest. 2L O2 per [place] reapplied, O2 sats up to 93%. Dr Leslye Peer is aware.

## 2017-04-12 NOTE — Progress Notes (Signed)
[facility]  ROUNDING NOTE   Subjective:  Renal function found to be improved today. Creatinine down to 2.67. Overall feeling better today.  Objective:  Vital signs in last 24 hours:  Temp:  [97.6 F (36.4 C)-98.1 F (36.7 C)] 97.9 F (36.6 C) (12/12 1022) Pulse Rate:  [81-108] 81 (12/12 0556) Resp:  [17-35] 18 (12/12 1022) BP: (135-193)/(57-102) 162/73 (12/12 1022) SpO2:  [86 %-100 %] 93 % (12/12 1024) Weight:  [72.6 kg (160 lb 1.6 oz)] 72.6 kg (160 lb 1.6 oz) (12/11 1500)  Weight change:  Filed Weights   04/09/17 1842 04/10/17 0134 04/11/17 1500  Weight: 70.3 kg (155 lb) 76.9 kg (169 lb 8.5 oz) 72.6 kg (160 lb 1.6 oz)    Intake/Output: I/O last 3 completed shifts: In: 680 [P.O.:580; IV Piggyback:100] Out: 3025 [FIEPP:2951]   Intake/Output this shift:  Total I/O In: 240 [P.O.:240] Out: 300 [Urine:300]  Physical Exam: General: No acute distress  Head: Normocephalic, atraumatic. Moist oral mucosal membranes  Eyes: Anicteric  Neck: Supple, trachea midline  Lungs:  Basilar rales, normal effort  Heart: S1S2 no rubs  Abdomen:  Soft, nontender, bowel sounds present  Extremities: 1+ peripheral edema.  Neurologic: Awake, alert, following commands  Skin: No lesions       Basic Metabolic Panel: Recent Labs  Lab 04/09/17 1850 04/11/17 0441 04/12/17 0734  NA 138 141 141  K 5.5* 4.4 4.2  CL 104 103 102  CO2 26 28 32  GLUCOSE 107* 117* 102*  BUN 48* 57* 53*  CREATININE 2.92* 3.05* 2.67*  CALCIUM 8.6* 8.7* 8.9    Liver Function Tests: No results for input(s): AST, ALT, ALKPHOS, BILITOT, PROT, ALBUMIN in the last 168 hours. No results for input(s): LIPASE, AMYLASE in the last 168 hours. No results for input(s): AMMONIA in the last 168 hours.  CBC: Recent Labs  Lab 04/09/17 1850 04/11/17 0441  WBC 5.9 6.4  NEUTROABS 4.5 5.0  HGB 9.5* 8.7*  HCT 29.5* 27.0*  MCV 85.6 85.1  PLT 202 210    Cardiac Enzymes: Recent Labs  Lab 04/09/17 1850   TROPONINI 0.06*    BNP: Invalid input(s): POCBNP  CBG: Recent Labs  Lab 04/11/17 1117 04/11/17 1705 04/11/17 2118 04/12/17 0731 04/12/17 1120  GLUCAP 171* 151* 142* 111* 157*    Microbiology: Results for orders placed or performed during the hospital encounter of 04/09/17  Culture, blood (routine x 2) Call MD if unable to obtain prior to antibiotics being given     Status: None (Preliminary result)   Collection Time: 04/09/17  6:50 PM  Result Value Ref Range Status   Specimen Description BLOOD LEFT ANTECUBITAL  Final   Special Requests   Final    BOTTLES DRAWN AEROBIC AND ANAEROBIC Blood Culture adequate volume   Culture NO GROWTH 2 DAYS  Final   Report Status PENDING  Incomplete  Culture, blood (routine x 2) Call MD if unable to obtain prior to antibiotics being given     Status: None (Preliminary result)   Collection Time: 04/09/17  6:50 PM  Result Value Ref Range Status   Specimen Description BLOOD LEFT ARM  Final   Special Requests   Final    BOTTLES DRAWN AEROBIC AND ANAEROBIC Blood Culture adequate volume   Culture NO GROWTH 2 DAYS  Final   Report Status PENDING  Incomplete  MRSA PCR Screening     Status: None   Collection Time: 04/10/17  2:06 AM  Result Value Ref Range Status  MRSA by PCR NEGATIVE NEGATIVE Final    Comment:        The GeneXpert MRSA Assay (FDA approved for NASAL specimens only), is one component of a comprehensive MRSA colonization surveillance program. It is not intended to diagnose MRSA infection nor to guide or monitor treatment for MRSA infections.   Culture, sputum-assessment     Status: None   Collection Time: 04/10/17  3:33 AM  Result Value Ref Range Status   Specimen Description EXPECTORATED SPUTUM  Final   Special Requests NONE  Final   Sputum evaluation   Final    Sputum specimen not acceptable for testing.  Please recollect.   C/BRITTON LEE RUST CHESTER AT 0420 04/10/17.PMH   Report Status 04/10/2017 FINAL  Final     Coagulation Studies: No results for input(s): LABPROT, INR in the last 72 hours.  Urinalysis: No results for input(s): COLORURINE, LABSPEC, PHURINE, GLUCOSEU, HGBUR, BILIRUBINUR, KETONESUR, PROTEINUR, UROBILINOGEN, NITRITE, LEUKOCYTESUR in the last 72 hours.  Invalid input(s): APPERANCEUR    Imaging: No results found.   Medications:   . sodium chloride    . azithromycin Stopped (04/11/17 1832)  . cefTRIAXone (ROCEPHIN)  IV Stopped (04/12/17 1115)   . amLODipine  5 mg Oral Daily  . carvedilol  12.5 mg Oral BID  . chlorhexidine  15 mL Mouth Rinse BID  . dorzolamide  1 drop Both Eyes TID  . furosemide  40 mg Intravenous Daily  . heparin  5,000 Units Subcutaneous Q8H  . insulin aspart  0-20 Units Subcutaneous TID WC  . insulin aspart  0-5 Units Subcutaneous QHS  . latanoprost  1 drop Left Eye QHS  . patiromer  8.4 g Oral Daily  . sodium chloride flush  3 mL Intravenous Q12H   sodium chloride, ALPRAZolam, sodium chloride flush  Assessment/ Plan:  83 y.o. female with diabetes mellitus type II, hypertension, anemia, GERD, depression/anxiety, glaucoma, hyperlipidemia, diastolic congestive heart failure, coronary artery disease, admitted now with acute respiratory failure secondary to diastolic heart failure.  1.  Acute renal failure/chronic kidney disease stage IV baseline EGFR 17 and creatinine of 2.86.  Renal function appears to be improved.  Creatinine down to 2.67.  Lasix was reduced yesterday.  2.  Acute respiratory failure secondary to acute diastolic heart failure.  Symptomatically the patient is significantly improved.  Continue Lasix.  3.  Anemia of chronic kidney disease.  Continue to periodically monitor CBC.  4.  Hyperkalemia.  Potassium under good control.  Continue the patient on veltassa.   LOS: 3 Vernis Eid 12/12/201811:43 AM

## 2017-04-13 ENCOUNTER — Telehealth: Payer: Self-pay | Admitting: Family Medicine

## 2017-04-13 LAB — GLUCOSE, CAPILLARY
Glucose-Capillary: 238 mg/dL — ABNORMAL HIGH (ref 65–99)
Glucose-Capillary: 328 mg/dL — ABNORMAL HIGH (ref 65–99)
Glucose-Capillary: 193 mg/dL — ABNORMAL HIGH (ref 65–99)

## 2017-04-13 MED ORDER — FUROSEMIDE 40 MG PO TABS: 40.0000 mg | ORAL_TABLET | Freq: Every day | ORAL | 0 refills | Status: DC

## 2017-04-13 MED ORDER — AZITHROMYCIN 250 MG PO TABS: 250.0000 mg | ORAL_TABLET | Freq: Every day | ORAL | 0 refills | Status: DC

## 2017-04-13 MED ORDER — CEFUROXIME AXETIL 250 MG PO TABS: 250.0000 mg | ORAL_TABLET | Freq: Two times a day (BID) | ORAL | 0 refills | Status: DC

## 2017-04-13 MED ORDER — INSULIN ASPART 100 UNIT/ML ~~LOC~~ SOLN: 0.0000 [IU] | Freq: Every day | SUBCUTANEOUS | Status: DC

## 2017-04-13 MED ORDER — INSULIN LISPRO PROT & LISPRO (75-25 MIX) 100 UNIT/ML KWIKPEN: 6.0000 [IU] | PEN_INJECTOR | Freq: Two times a day (BID) | SUBCUTANEOUS | 12 refills | Status: DC

## 2017-04-13 MED ORDER — INSULIN ASPART 100 UNIT/ML ~~LOC~~ SOLN
0.0000 [IU] | Freq: Three times a day (TID) | SUBCUTANEOUS | Status: DC
  Administered 2017-04-13: 18:00:00 2 [IU] via SUBCUTANEOUS
  Filled 2017-04-13: qty 1

## 2017-04-13 MED ORDER — ALBUTEROL SULFATE HFA 108 (90 BASE) MCG/ACT IN AERS: 2.0000 | INHALATION_SPRAY | Freq: Four times a day (QID) | RESPIRATORY_TRACT | 0 refills | Status: DC | PRN

## 2017-04-13 NOTE — Discharge Summary (Signed)
[facility] NAME: Lori Stanley    MR#:  [id number]  DATE OF BIRTH:  [birth year]  DATE OF ADMISSION:  04/09/2017 ADMITTING PHYSICIAN: Avel Peace Salary, MD  DATE OF DISCHARGE: 04/13/2017  PRIMARY CARE PHYSICIAN: Jerrol Banana., MD    ADMISSION DIAGNOSIS:  Hyperkalemia [E87.5] COPD exacerbation ([place]) [J44.1] Community acquired pneumonia of left lower lobe of lung ([place]) [J18.1] Acute renal failure superimposed on chronic kidney disease, unspecified CKD stage, unspecified acute renal failure type ([place]) [N17.9, N18.9] Acute on chronic congestive heart failure, unspecified heart failure type ([place]) [I50.9]  DISCHARGE DIAGNOSIS:  Active Problems:   Respiratory failure ([place])   SECONDARY DIAGNOSIS:   Past Medical History:  Diagnosis Date  . Acute heart failure ([place])   . Chronic low back pain   . Diabetes mellitus without complication ([place])   . Hyperlipidemia   . Hypertension   . MI (myocardial infarction) ([place])   . Obesity     HOSPITAL COURSE:   1.  Acute on chronic hypoxic respiratory failure.  But the patient was initially admitted to the CCU stepdown on BiPAP.  The patient is now down to nasal cannula.  We were unable to taper off the oxygen during the hospital stay.  Patient dropped down into the 80s at rest.  Patient currently on 2 L nasal cannula.  Apparently the patient has oxygen at home but does not wear it.  She was advised to wear her oxygen 24/7.  The patient was given 1 dose of Solu-Medrol during the hospital course and nebulizer treatments.  Will be given an albuterol inhaler upon going home. 2.  Acute diastolic congestive heart failure.  Patient was diuresed with Lasix 80 mg IV twice daily and switch over to 40 mg IV daily.  She will go home on 40 mg orally daily.  She is on Coreg.  Can restart Accupril.  CHF clinic as outpatient.  3.  Pneumonia left lower lobe patient was on Rocephin and Zithromax while here  in the hospital will switch over to Ceftin and p.o. Zithromax at home. 4.  Acute kidney disease on chronic kidney disease stage IV.  Follow-up with Dr. Juleen China as outpatient.  Patient was seen by Dr. Holley Raring his associate during the hospital course. 5.  Hyperkalemia this has improved 6.  Glaucoma on latanoprost 7.  Type 3 diabetes mellitus.  Patient on 70/30 insulin at home.  The patient was only on sliding scale here.  Can restart low-dose 70/30 insulin 6 units twice daily. The patient's sugars have all been okay except for after giving the steroids where they did come up.  The patient states that she has had low sugars at home. 8.  Physical therapy recommended home with home health and rolling walker.  Home health set up. 9.  Anemia follow-up as outpatient  DISCHARGE CONDITIONS:   Satisfactory  CONSULTS OBTAINED:  Critical care specialist Nephrologist  DRUG ALLERGIES:   Allergies  Allergen Reactions  . Antihistamines, Chlorpheniramine-Type   . Codeine   . Paxil [Paroxetine]     "makes her feel funny" not in a good way    DISCHARGE MEDICATIONS:   Allergies as of 04/13/2017      Reactions   Antihistamines, Chlorpheniramine-type    Codeine    Paxil [paroxetine]    "makes her feel funny" not in a good way      Medication List    STOP taking these medications   amoxicillin-clavulanate 875-125  MG tablet Commonly known as:  AUGMENTIN     TAKE these medications   acetaminophen 500 MG tablet Commonly known as:  TYLENOL Take 1,000 mg by mouth every 6 (six) hours as needed.   albuterol 108 (90 Base) MCG/ACT inhaler Commonly known as:  PROVENTIL HFA;VENTOLIN HFA Inhale 2 puffs into the lungs every 6 (six) hours as needed for wheezing or shortness of breath.   amLODipine 5 MG tablet Commonly known as:  NORVASC Take 1 tablet (5 mg total) by mouth daily.   aspirin 81 MG EC tablet TAKE 1 TABLET (81 MG TOTAL) BY MOUTH DAILY.   azithromycin 250 MG tablet Commonly known as:   ZITHROMAX Take 1 tablet (250 mg total) by mouth daily.   B-D UF III MINI PEN NEEDLES 31G X 5 MM Misc Generic drug:  Insulin Pen Needle USE TWICE A DAY AS DIRECTED   BEPREVE 1.5 % Soln Generic drug:  Bepotastine Besilate Place 1 drop into both eyes daily.   carvedilol 12.5 MG tablet Commonly known as:  COREG TAKE 1 TABLET BY MOUTH TWICE A DAY   cefUROXime 250 MG tablet Commonly known as:  CEFTIN Take 1 tablet (250 mg total) by mouth 2 (two) times daily with a meal.   clonazePAM 0.5 MG tablet Commonly known as:  KLONOPIN TAKE 1 TABLET BY MOUTH TWICE A DAY AS NEEDED   dorzolamide 2 % ophthalmic solution Commonly known as:  TRUSOPT Place 1 drop into both eyes 3 (three) times daily.   furosemide 40 MG tablet Commonly known as:  LASIX Take 1 tablet (40 mg total) by mouth daily. What changed:    medication strength  See the new instructions.   Insulin Lispro Prot & Lispro (75-25) 100 UNIT/ML Kwikpen Commonly known as:  HUMALOG MIX 75/25 KWIKPEN Inject 6 Units into the skin 2 (two) times daily. What changed:  how much to take   latanoprost 0.005 % ophthalmic solution Commonly known as:  XALATAN Place 1 drop into the left eye at bedtime.   loratadine 10 MG tablet Commonly known as:  CLARITIN Take 1 tablet (10 mg total) by mouth daily.   quinapril 40 MG tablet Commonly known as:  ACCUPRIL TAKE 1 TABLET BY MOUTH EVERY DAY   ranitidine 150 MG capsule Commonly known as:  ZANTAC Take 1 capsule (150 mg total) by mouth 2 (two) times daily.   simvastatin 40 MG tablet Commonly known as:  ZOCOR TAKE 1 TABLET (40 MG TOTAL) BY MOUTH DAILY.   traZODone 150 MG tablet Commonly known as:  DESYREL TAKE 1 TABLET BY MOUTH EVERY NIGHT AT BEDTIME            Durable Medical Equipment  (From admission, onward)        Start     Ordered   04/13/17 1310  For home use only DME Walker rolling  Once    Question:  Patient needs a walker to treat with the following condition   Answer:  Unsteady gait   04/13/17 1309       DISCHARGE INSTRUCTIONS:   Follow-up nephrology as outpatient 2 weeks Follow-up CHF clinic Follow-up PMD 1 week  If you experience worsening of your admission symptoms, develop shortness of breath, life threatening emergency, suicidal or homicidal thoughts you must seek medical attention immediately by calling 911 or calling your MD immediately  if symptoms less severe.  You Must read complete instructions/literature along with all the possible adverse reactions/side effects for all the Medicines you take and  that have been prescribed to you. Take any new Medicines after you have completely understood and accept all the possible adverse reactions/side effects.   Please note  You were cared for by a hospitalist during your hospital stay. If you have any questions about your discharge medications or the care you received while you were in the hospital after you are discharged, you can call the unit and asked to speak with the hospitalist on call if the hospitalist that took care of you is not available. Once you are discharged, your primary care physician will handle any further medical issues. Please note that NO REFILLS for any discharge medications will be authorized once you are discharged, as it is imperative that you return to your primary care physician (or establish a relationship with a primary care physician if you do not have one) for your aftercare needs so that they can reassess your need for medications and monitor your lab values.    Today   CHIEF COMPLAINT:   Chief Complaint  Patient presents with  . Shortness of Breath    HISTORY OF PRESENT ILLNESS:  Lori Stanley  is a 83 y.o. female presented with shortness of breath found to have CHF and pneumonia   VITAL SIGNS:  Blood pressure (!) 143/69, pulse 91, temperature 98.3 F (36.8 C), temperature source Oral, resp. rate 20, height 5\' 3"  (1.6 m), weight 72.6 kg (160 lb 1.6  oz), SpO2 94 %.    PHYSICAL EXAMINATION:  GENERAL:  83 y.o.-year-old patient lying in the bed with no acute distress.  EYES: Pupils equal, round, reactive to light and accommodation. No scleral icterus. Extraocular muscles intact.  HEENT: Head atraumatic, normocephalic. Oropharynx and nasopharynx clear.  NECK:  Supple, no jugular venous distention. No thyroid enlargement, no tenderness.  LUNGS:  decrease breath sounds bilaterally, no wheezing, rales,rhonchi or crepitation. No use of accessory muscles of respiration.  CARDIOVASCULAR: S1, S2 normal. No murmurs, rubs, or gallops.  ABDOMEN: Soft, non-tender, non-distended. Bowel sounds present. No organomegaly or mass.  EXTREMITIES:  2+ pedal edema, no cyanosis, or clubbing.  NEUROLOGIC: Cranial nerves II through XII are intact. Muscle strength 5/5 in all extremities. Sensation intact. Gait not checked.  PSYCHIATRIC: The patient is alert and oriented x 3.  SKIN: Chronic lower extremity skin discoloration  DATA REVIEW:   CBC Recent Labs  Lab 04/11/17 0441  WBC 6.4  HGB 8.7*  HCT 27.0*  PLT 210    Chemistries  Recent Labs  Lab 04/12/17 0734  NA 141  K 4.2  CL 102  CO2 32  GLUCOSE 102*  BUN 53*  CREATININE 2.67*  CALCIUM 8.9    Cardiac Enzymes Recent Labs  Lab 04/09/17 1850  TROPONINI 0.06*    Microbiology Results  Results for orders placed or performed during the hospital encounter of 04/09/17  Culture, blood (routine x 2) Call MD if unable to obtain prior to antibiotics being given     Status: None (Preliminary result)   Collection Time: 04/09/17  6:50 PM  Result Value Ref Range Status   Specimen Description BLOOD LEFT ANTECUBITAL  Final   Special Requests   Final    BOTTLES DRAWN AEROBIC AND ANAEROBIC Blood Culture adequate volume   Culture NO GROWTH 3 DAYS  Final   Report Status PENDING  Incomplete  Culture, blood (routine x 2) Call MD if unable to obtain prior to antibiotics being given     Status: None  (Preliminary result)   Collection Time: 04/09/17  6:50 PM  Result Value Ref Range Status   Specimen Description BLOOD LEFT ARM  Final   Special Requests   Final    BOTTLES DRAWN AEROBIC AND ANAEROBIC Blood Culture adequate volume   Culture NO GROWTH 3 DAYS  Final   Report Status PENDING  Incomplete  MRSA PCR Screening     Status: None   Collection Time: 04/10/17  2:06 AM  Result Value Ref Range Status   MRSA by PCR NEGATIVE NEGATIVE Final    Comment:        The GeneXpert MRSA Assay (FDA approved for NASAL specimens only), is one component of a comprehensive MRSA colonization surveillance program. It is not intended to diagnose MRSA infection nor to guide or monitor treatment for MRSA infections.   Culture, sputum-assessment     Status: None   Collection Time: 04/10/17  3:33 AM  Result Value Ref Range Status   Specimen Description EXPECTORATED SPUTUM  Final   Special Requests NONE  Final   Sputum evaluation   Final    Sputum specimen not acceptable for testing.  Please recollect.   C/BRITTON LEE RUST CHESTER AT 0420 04/10/17.PMH   Report Status 04/10/2017 FINAL  Final      Management plans discussed with the patient, family and they are in agreement.  CODE STATUS:     Code Status Orders  (From admission, onward)        Start     Ordered   04/10/17 0141  Full code  Continuous     04/10/17 0140    Code Status History    Date Active Date Inactive Code Status Order ID Comments User Context   This patient has a current code status but no historical code status.      TOTAL TIME TAKING CARE OF THIS PATIENT: 35 minutes.    Loletha Grayer M.D on 04/13/2017 at 1:11 PM  Between 7am to 6pm - Pager - [phone]  After 6pm go to [email or web address] - password [email or web address] [facility]  [phone]  CC: Primary care physician; Jerrol Banana., MD

## 2017-04-13 NOTE — Telephone Encounter (Signed)
Pt is being discharged from [facility] today for respiratory failure.  I have scheduled a hospital follow up appointment/MW

## 2017-04-13 NOTE — Discharge Instructions (Signed)
Need to wear oxygen all the time

## 2017-04-13 NOTE — Telephone Encounter (Signed)
Please review

## 2017-04-13 NOTE — Plan of Care (Signed)
  Education: Knowledge of General Education information will improve 04/13/2017 0328 - Progressing by Jeffie Pollock, RN   Health Behavior/Discharge Planning: Ability to manage health-related needs will improve 04/13/2017 0328 - Progressing by Jeffie Pollock, RN   Clinical Measurements: Ability to maintain clinical measurements within normal limits will improve 04/13/2017 0328 - Progressing by Jeffie Pollock, RN Will remain free from infection 04/13/2017 0328 - Progressing by Jeffie Pollock, RN Diagnostic test results will improve 04/13/2017 0328 - Progressing by Jeffie Pollock, RN Respiratory complications will improve 04/13/2017 0328 - Progressing by Jeffie Pollock, RN Cardiovascular complication will be avoided 04/13/2017 0328 - Progressing by Jeffie Pollock, RN   Activity: Risk for activity intolerance will decrease 04/13/2017 0328 - Progressing by Jeffie Pollock, RN   Nutrition: Adequate nutrition will be maintained 04/13/2017 0328 - Progressing by Jeffie Pollock, RN   Coping: Level of anxiety will decrease 04/13/2017 0328 - Progressing by Jeffie Pollock, RN    Elimination: Will not experience complications related to bowel motility 04/13/2017 0328 - Progressing by Jeffie Pollock, RN Will not experience complications related to urinary retention 04/13/2017 0328 - Progressing by Jeffie Pollock, RN  Pain Managment: General experience of comfort will improve 04/13/2017 0328 - Progressing by Jeffie Pollock, RN   Safety: Ability to remain free from injury will improve 04/13/2017 0328 - Progressing by Jeffie Pollock, RN   Skin Integrity: Risk for impaired skin integrity will decrease 04/13/2017 0328 - Progressing by Jeffie Pollock, RN

## 2017-04-13 NOTE — Progress Notes (Signed)
Pt is being discharged home with [facility]. O2 tank and a walker given to pt. Discharge papers given and explained to pt. Pt verbalized understanding. Meds and f/u appointments reviewed. RX to be picked up from pharmacy. Pt is aware. Awaiting transportation.

## 2017-04-13 NOTE — Progress Notes (Signed)
Weaned pt to RA, O2 sats down to 80% at rest. 2L O2 per [place] reapplied, O2 sats up to 94%. Dr Leslye Peer is aware.

## 2017-04-13 NOTE — Progress Notes (Signed)
[facility]  ROUNDING NOTE   Subjective:  Patient sitting up in chair. Feeling better as compared to admission. Renal function stable.  Objective:  Vital signs in last 24 hours:  Temp:  [97.9 F (36.6 C)-98.3 F (36.8 C)] 98.3 F (36.8 C) (12/13 1456) Pulse Rate:  [83-97] 92 (12/13 1456) Resp:  [16-20] 18 (12/13 1456) BP: (143-163)/(59-78) 155/59 (12/13 1456) SpO2:  [80 %-100 %] 96 % (12/13 1456)  Weight change:  Filed Weights   04/09/17 1842 04/10/17 0134 04/11/17 1500  Weight: 70.3 kg (155 lb) 76.9 kg (169 lb 8.5 oz) 72.6 kg (160 lb 1.6 oz)    Intake/Output: I/O last 3 completed shifts: In: 55 [P.O.:720; IV Piggyback:100] Out: 750 [Urine:750]   Intake/Output this shift:  Total I/O In: 480 [P.O.:480] Out: -   Physical Exam: General: No acute distress  Head: Normocephalic, atraumatic. Moist oral mucosal membranes  Eyes: Anicteric  Neck: Supple, trachea midline  Lungs:  CTAB, normal effort  Heart: S1S2 no rubs  Abdomen:  Soft, nontender, bowel sounds present  Extremities: trace peripheral edema.  Neurologic: Awake, alert, following commands  Skin: No lesions       Basic Metabolic Panel: Recent Labs  Lab 04/09/17 1850 04/11/17 0441 04/12/17 0734  NA 138 141 141  K 5.5* 4.4 4.2  CL 104 103 102  CO2 26 28 32  GLUCOSE 107* 117* 102*  BUN 48* 57* 53*  CREATININE 2.92* 3.05* 2.67*  CALCIUM 8.6* 8.7* 8.9    Liver Function Tests: No results for input(s): AST, ALT, ALKPHOS, BILITOT, PROT, ALBUMIN in the last 168 hours. No results for input(s): LIPASE, AMYLASE in the last 168 hours. No results for input(s): AMMONIA in the last 168 hours.  CBC: Recent Labs  Lab 04/09/17 1850 04/11/17 0441  WBC 5.9 6.4  NEUTROABS 4.5 5.0  HGB 9.5* 8.7*  HCT 29.5* 27.0*  MCV 85.6 85.1  PLT 202 210    Cardiac Enzymes: Recent Labs  Lab 04/09/17 1850  TROPONINI 0.06*    BNP: Invalid input(s): POCBNP  CBG: Recent Labs  Lab 04/12/17 1120  04/12/17 1707 04/12/17 2102 04/13/17 0801 04/13/17 1132  GLUCAP 157* 120* 59* 90* 1*    Microbiology: Results for orders placed or performed during the hospital encounter of 04/09/17  Culture, blood (routine x 2) Call MD if unable to obtain prior to antibiotics being given     Status: None (Preliminary result)   Collection Time: 04/09/17  6:50 PM  Result Value Ref Range Status   Specimen Description BLOOD LEFT ANTECUBITAL  Final   Special Requests   Final    BOTTLES DRAWN AEROBIC AND ANAEROBIC Blood Culture adequate volume   Culture NO GROWTH 3 DAYS  Final   Report Status PENDING  Incomplete  Culture, blood (routine x 2) Call MD if unable to obtain prior to antibiotics being given     Status: None (Preliminary result)   Collection Time: 04/09/17  6:50 PM  Result Value Ref Range Status   Specimen Description BLOOD LEFT ARM  Final   Special Requests   Final    BOTTLES DRAWN AEROBIC AND ANAEROBIC Blood Culture adequate volume   Culture NO GROWTH 3 DAYS  Final   Report Status PENDING  Incomplete  MRSA PCR Screening     Status: None   Collection Time: 04/10/17  2:06 AM  Result Value Ref Range Status   MRSA by PCR NEGATIVE NEGATIVE Final    Comment:  The GeneXpert MRSA Assay (FDA approved for NASAL specimens only), is one component of a comprehensive MRSA colonization surveillance program. It is not intended to diagnose MRSA infection nor to guide or monitor treatment for MRSA infections.   Culture, sputum-assessment     Status: None   Collection Time: 04/10/17  3:33 AM  Result Value Ref Range Status   Specimen Description EXPECTORATED SPUTUM  Final   Special Requests NONE  Final   Sputum evaluation   Final    Sputum specimen not acceptable for testing.  Please recollect.   C/BRITTON LEE RUST [place]STER AT 0420 04/10/17.PMH   Report Status 04/10/2017 FINAL  Final    Coagulation Studies: No results for input(s): LABPROT, INR in the last 72 hours.  Urinalysis: No  results for input(s): COLORURINE, LABSPEC, PHURINE, GLUCOSEU, HGBUR, BILIRUBINUR, KETONESUR, PROTEINUR, UROBILINOGEN, NITRITE, LEUKOCYTESUR in the last 72 hours.  Invalid input(s): APPERANCEUR    Imaging: No results found.   Medications:   . sodium chloride    . cefTRIAXone (ROCEPHIN)  IV Stopped (04/13/17 0930)   . amLODipine  5 mg Oral Daily  . azithromycin  250 mg Oral Daily  . budesonide (PULMICORT) nebulizer solution  0.5 mg Nebulization BID  . carvedilol  12.5 mg Oral BID  . chlorhexidine  15 mL Mouth Rinse BID  . dorzolamide  1 drop Both Eyes TID  . furosemide  40 mg Intravenous Daily  . heparin  5,000 Units Subcutaneous Q8H  . insulin aspart  0-5 Units Subcutaneous QHS  . insulin aspart  0-9 Units Subcutaneous TID WC  . ipratropium-albuterol  3 mL Nebulization Q6H  . latanoprost  1 drop Left Eye QHS  . sodium chloride flush  3 mL Intravenous Q12H   sodium chloride, ALPRAZolam, sodium chloride flush  Assessment/ Plan:  83 y.o. female with diabetes mellitus type II, hypertension, anemia, GERD, depression/anxiety, glaucoma, hyperlipidemia, diastolic congestive heart failure, coronary artery disease, admitted now with acute respiratory failure secondary to diastolic heart failure.  1.  Acute renal failure/chronic kidney disease stage IV baseline EGFR 17 and creatinine of 2.86.  Patient appears to be at her baseline renal function. We will schedule followupfor the patient in our office.  2.  Acute respiratory failure secondary to acute diastolic heart failure.  Significantly improved as compared to admission.  Continue the patient on diuretic therapy at home.  3.  Anemia of chronic kidney disease.  Hemoglobin currently 8.7.  We will need to continue to monitor as an outpatient.   4.  Hyperkalemia.  Potassium currently 4.2 and under good control.  Maintain the patient on patiromer.   LOS: 4 Ramsey Guadamuz 12/13/20183:04 PM

## 2017-04-14 DIAGNOSIS — R0602 Shortness of breath: Secondary | ICD-10-CM | POA: Diagnosis not present

## 2017-04-15 LAB — CULTURE, BLOOD (ROUTINE X 2)
CULTURE: NO GROWTH
Culture: NO GROWTH
SPECIAL REQUESTS: ADEQUATE
Special Requests: ADEQUATE

## 2017-04-16 DIAGNOSIS — H409 Unspecified glaucoma: Secondary | ICD-10-CM | POA: Diagnosis not present

## 2017-04-16 DIAGNOSIS — J9611 Chronic respiratory failure with hypoxia: Secondary | ICD-10-CM | POA: Diagnosis not present

## 2017-04-16 DIAGNOSIS — I5033 Acute on chronic diastolic (congestive) heart failure: Secondary | ICD-10-CM | POA: Diagnosis not present

## 2017-04-16 DIAGNOSIS — M1991 Primary osteoarthritis, unspecified site: Secondary | ICD-10-CM | POA: Diagnosis not present

## 2017-04-16 DIAGNOSIS — E785 Hyperlipidemia, unspecified: Secondary | ICD-10-CM | POA: Diagnosis not present

## 2017-04-16 DIAGNOSIS — E1122 Type 2 diabetes mellitus with diabetic chronic kidney disease: Secondary | ICD-10-CM | POA: Diagnosis not present

## 2017-04-16 DIAGNOSIS — I130 Hypertensive heart and chronic kidney disease with heart failure and stage 1 through stage 4 chronic kidney disease, or unspecified chronic kidney disease: Secondary | ICD-10-CM | POA: Diagnosis not present

## 2017-04-16 DIAGNOSIS — I252 Old myocardial infarction: Secondary | ICD-10-CM | POA: Diagnosis not present

## 2017-04-16 DIAGNOSIS — Z87891 Personal history of nicotine dependence: Secondary | ICD-10-CM | POA: Diagnosis not present

## 2017-04-16 DIAGNOSIS — N184 Chronic kidney disease, stage 4 (severe): Secondary | ICD-10-CM | POA: Diagnosis not present

## 2017-04-18 ENCOUNTER — Ambulatory Visit
Admission: RE | Admit: 2017-04-18 | Discharge: 2017-04-18 | Disposition: A | Discharge status: Home / Self Care | Payer: Medicare Other | Source: Ambulatory Visit | Attending: Family Medicine | Admitting: Family Medicine

## 2017-04-18 ENCOUNTER — Ambulatory Visit (INDEPENDENT_AMBULATORY_CARE_PROVIDER_SITE_OTHER): Payer: Medicare Other | Admitting: Family Medicine

## 2017-04-18 ENCOUNTER — Telehealth: Payer: Self-pay

## 2017-04-18 VITALS — BP 142/60 | HR 79 | Temp 98.0°F

## 2017-04-18 DIAGNOSIS — E1122 Type 2 diabetes mellitus with diabetic chronic kidney disease: Secondary | ICD-10-CM | POA: Diagnosis not present

## 2017-04-18 DIAGNOSIS — D631 Anemia in chronic kidney disease: Secondary | ICD-10-CM | POA: Diagnosis not present

## 2017-04-18 DIAGNOSIS — J181 Lobar pneumonia, unspecified organism: Secondary | ICD-10-CM | POA: Insufficient documentation

## 2017-04-18 DIAGNOSIS — Z09 Encounter for follow-up examination after completed treatment for conditions other than malignant neoplasm: Secondary | ICD-10-CM

## 2017-04-18 DIAGNOSIS — J189 Pneumonia, unspecified organism: Secondary | ICD-10-CM

## 2017-04-18 DIAGNOSIS — J9601 Acute respiratory failure with hypoxia: Secondary | ICD-10-CM | POA: Insufficient documentation

## 2017-04-18 DIAGNOSIS — E1121 Type 2 diabetes mellitus with diabetic nephropathy: Secondary | ICD-10-CM

## 2017-04-18 DIAGNOSIS — I5032 Chronic diastolic (congestive) heart failure: Secondary | ICD-10-CM | POA: Diagnosis not present

## 2017-04-18 DIAGNOSIS — N179 Acute kidney failure, unspecified: Secondary | ICD-10-CM | POA: Diagnosis not present

## 2017-04-18 DIAGNOSIS — N183 Chronic kidney disease, stage 3 (moderate): Secondary | ICD-10-CM | POA: Diagnosis not present

## 2017-04-18 DIAGNOSIS — I10 Essential (primary) hypertension: Secondary | ICD-10-CM | POA: Diagnosis not present

## 2017-04-18 DIAGNOSIS — R918 Other nonspecific abnormal finding of lung field: Secondary | ICD-10-CM | POA: Insufficient documentation

## 2017-04-18 DIAGNOSIS — I129 Hypertensive chronic kidney disease with stage 1 through stage 4 chronic kidney disease, or unspecified chronic kidney disease: Secondary | ICD-10-CM | POA: Diagnosis not present

## 2017-04-18 NOTE — Telephone Encounter (Signed)
LM on voicemail to contact office to make new patient appointment.

## 2017-04-18 NOTE — Telephone Encounter (Signed)
-----   Message from Alisa Graff, [place] sent at 04/14/2017  8:19 AM EST ----- Regarding: Please call to schedule appointment Contact: [phone] Discharged on 12/13

## 2017-04-18 NOTE — Progress Notes (Signed)
Patient: Lori Stanley Female    DOB: 02-20-1932   83 y.o.   MRN: [id number] Visit Date: 04/18/2017  Today's Provider: [name], MD   Chief Complaint  Patient presents with  . Hospitalization Follow-up   Subjective:    HPI Hospital stay 04/09/17-04/13/18  Admission Dx: Hyperkalemia, COPD exacerbation, CAP left lower lobe, acute renal failure, acute on chronic CHF   Discharge Dx: acute respiratory failure  Medication changes: st Ceftin and Zithromax, lasix and albuterol   Recommend pt for PT with home health and rolling walker  Follow up anemia as out patient.    Pt reports that she feels good. Denies any shortness of breath.       Allergies  Allergen Reactions  . Antihistamines, Chlorpheniramine-Type   . Codeine   . Paxil [Paroxetine]     "makes her feel funny" not in a good way     Current Outpatient Medications:  .  acetaminophen (TYLENOL) 500 MG tablet, Take 1,000 mg by mouth every 6 (six) hours as needed., Disp: , Rfl:  .  albuterol (PROVENTIL HFA;VENTOLIN HFA) 108 (90 Base) MCG/ACT inhaler, Inhale 2 puffs into the lungs every 6 (six) hours as needed for wheezing or shortness of breath., Disp: 1 Inhaler, Rfl: 0 .  amLODipine (NORVASC) 5 MG tablet, Take 1 tablet (5 mg total) by mouth daily., Disp: 90 tablet, Rfl: 3 .  aspirin 81 MG EC tablet, TAKE 1 TABLET (81 MG TOTAL) BY MOUTH DAILY., Disp: 90 tablet, Rfl: 3 .  azithromycin (ZITHROMAX) 250 MG tablet, Take 1 tablet (250 mg total) by mouth daily., Disp: 2 tablet, Rfl: 0 .  B-D UF III MINI PEN NEEDLES 31G X 5 MM MISC, USE TWICE A DAY AS DIRECTED, Disp: 100 each, Rfl: 5 .  Bepotastine Besilate (BEPREVE) 1.5 % SOLN, Place 1 drop into both eyes daily., Disp: , Rfl:  .  carvedilol (COREG) 12.5 MG tablet, TAKE 1 TABLET BY MOUTH TWICE A DAY, Disp: 180 tablet, Rfl: 3 .  cefUROXime (CEFTIN) 250 MG tablet, Take 1 tablet (250 mg total) by mouth 2 (two) times daily with a meal., Disp: 8 tablet, Rfl:  0 .  clonazePAM (KLONOPIN) 0.5 MG tablet, TAKE 1 TABLET BY MOUTH TWICE A DAY AS NEEDED, Disp: 60 tablet, Rfl: 4 .  dorzolamide (TRUSOPT) 2 % ophthalmic solution, Place 1 drop into both eyes 3 (three) times daily., Disp: 10 mL, Rfl: 0 .  furosemide (LASIX) 40 MG tablet, Take 1 tablet (40 mg total) by mouth daily., Disp: 30 tablet, Rfl: 0 .  Insulin Lispro Prot & Lispro (HUMALOG MIX 75/25 KWIKPEN) (75-25) 100 UNIT/ML Kwikpen, Inject 6 Units into the skin 2 (two) times daily., Disp: 16 pen, Rfl: 12 .  latanoprost (XALATAN) 0.005 % ophthalmic solution, Place 1 drop into the left eye at bedtime. , Disp: , Rfl:  .  loratadine (CLARITIN) 10 MG tablet, Take 1 tablet (10 mg total) by mouth daily., Disp: 90 tablet, Rfl: 3 .  quinapril (ACCUPRIL) 40 MG tablet, TAKE 1 TABLET BY MOUTH EVERY DAY, Disp: 90 tablet, Rfl: 3 .  ranitidine (ZANTAC) 150 MG capsule, Take 1 capsule (150 mg total) by mouth 2 (two) times daily., Disp: 30 capsule, Rfl: 12 .  simvastatin (ZOCOR) 40 MG tablet, TAKE 1 TABLET (40 MG TOTAL) BY MOUTH DAILY., Disp: 30 tablet, Rfl: 12 .  traZODone (DESYREL) 150 MG tablet, TAKE 1 TABLET BY MOUTH EVERY NIGHT AT BEDTIME, Disp: 90 tablet, Rfl: 3  Review of Systems  Constitutional: Negative.   HENT: Negative.   Eyes: Negative.   Respiratory: Negative.   Cardiovascular: Negative.   Gastrointestinal: Positive for diarrhea.  Endocrine: Negative.   Genitourinary: Negative.   Musculoskeletal: Negative.   Skin: Negative.   Allergic/Immunologic: Negative.   Neurological: Negative.   Hematological: Negative.   Psychiatric/Behavioral: Negative.     Social History   Tobacco Use  . Smoking status: Former Smoker    Types: Cigarettes  . Smokeless tobacco: Never Used  Substance Use Topics  . Alcohol use: No   Objective:   BP (!) 142/60 (BP Location: Left Arm, Patient Position: Sitting, Cuff Size: Normal)   Pulse 79   Temp 98 F (36.7 C) (Oral)   SpO2 97%  Vitals:   04/18/17 1338  BP: (!)  142/60  Pulse: 79  Temp: 98 F (36.7 C)  TempSrc: Oral  SpO2: 97%     Physical Exam  Constitutional: She is oriented to person, place, and time. She appears well-developed and well-nourished.  HENT:  Head: Normocephalic and atraumatic.  Right Ear: External ear normal.  Left Ear: External ear normal.  Nose: Nose normal.  Poor dentition--she has 6 teeth left.  Eyes: Conjunctivae and EOM are normal. Pupils are equal, round, and reactive to light.  Neck: Normal range of motion. Neck supple.  Cardiovascular: Normal rate, regular rhythm, normal heart sounds and intact distal pulses.  Pulmonary/Chest: Effort normal and breath sounds normal.  Musculoskeletal: Normal range of motion. She exhibits edema (2+).  2+ LE edema.  Neurological: She is alert and oriented to person, place, and time. She has normal reflexes.  Skin: Skin is warm and dry.  Psychiatric: She has a normal mood and affect. Her behavior is normal. Judgment and thought content normal.        Assessment & Plan:     1. Hospital discharge follow-up   2. Acute respiratory failure with hypoxia ([place])  - DG Chest 2 View; Future  3. Essential (primary) hypertension   4. Chronic diastolic heart failure ([place]) Follow clinically. EF 60%. F/u with CHF clinic.  5. Type 2 diabetes mellitus with diabetic nephropathy, without long-term current use of insulin ([place])   6. Community acquired pneumonia of left lower lobe of lung ([place]) Repeat CXR 4-6 weeks. - DG Chest 2 View; Future 7.CKD Followed by nephrology.    HPI, Exam, and A&P Transcribed under the direction and in the presence of Purva Vessell L. Cranford Mon, MD  Electronically Signed: Katina Dung, CMA  I have done the exam and reviewed the above chart and it is accurate to the best of my knowledge. Development worker, community has been used in this note in any air is in the dictation or transcription are unintentional.  [name], MD  [place]

## 2017-04-18 NOTE — Patient Instructions (Addendum)
Try Probiotic over the counter

## 2017-04-18 NOTE — Telephone Encounter (Signed)
Tried to contact pt one more time and was unable to reach her. Left a message reminding pt of her hospital f/u apt scheduled today at 1:30 PM.

## 2017-04-20 DIAGNOSIS — Z87891 Personal history of nicotine dependence: Secondary | ICD-10-CM | POA: Diagnosis not present

## 2017-04-20 DIAGNOSIS — E1122 Type 2 diabetes mellitus with diabetic chronic kidney disease: Secondary | ICD-10-CM | POA: Diagnosis not present

## 2017-04-20 DIAGNOSIS — I130 Hypertensive heart and chronic kidney disease with heart failure and stage 1 through stage 4 chronic kidney disease, or unspecified chronic kidney disease: Secondary | ICD-10-CM | POA: Diagnosis not present

## 2017-04-20 DIAGNOSIS — J9611 Chronic respiratory failure with hypoxia: Secondary | ICD-10-CM | POA: Diagnosis not present

## 2017-04-20 DIAGNOSIS — I252 Old myocardial infarction: Secondary | ICD-10-CM | POA: Diagnosis not present

## 2017-04-20 DIAGNOSIS — E785 Hyperlipidemia, unspecified: Secondary | ICD-10-CM | POA: Diagnosis not present

## 2017-04-20 DIAGNOSIS — I5033 Acute on chronic diastolic (congestive) heart failure: Secondary | ICD-10-CM | POA: Diagnosis not present

## 2017-04-20 DIAGNOSIS — N184 Chronic kidney disease, stage 4 (severe): Secondary | ICD-10-CM | POA: Diagnosis not present

## 2017-04-20 DIAGNOSIS — M1991 Primary osteoarthritis, unspecified site: Secondary | ICD-10-CM | POA: Diagnosis not present

## 2017-04-20 DIAGNOSIS — H409 Unspecified glaucoma: Secondary | ICD-10-CM | POA: Diagnosis not present

## 2017-04-21 ENCOUNTER — Other Ambulatory Visit: Payer: Self-pay | Admitting: Family Medicine

## 2017-04-21 MED ORDER — TRAZODONE HCL 150 MG PO TABS: 150.0000 mg | ORAL_TABLET | Freq: Every day | ORAL | 3 refills | Status: DC

## 2017-04-21 NOTE — Telephone Encounter (Signed)
[facility] faxed refill request for the following medications:  traZODone (DESYREL) 150 MG tablet  90 day supply  Last Rx: 04/27/16 with 3 refills LOV: 04/18/17 Please advise. Thanks TNP

## 2017-04-28 DIAGNOSIS — M1991 Primary osteoarthritis, unspecified site: Secondary | ICD-10-CM | POA: Diagnosis not present

## 2017-04-28 DIAGNOSIS — E1122 Type 2 diabetes mellitus with diabetic chronic kidney disease: Secondary | ICD-10-CM | POA: Diagnosis not present

## 2017-04-28 DIAGNOSIS — I252 Old myocardial infarction: Secondary | ICD-10-CM | POA: Diagnosis not present

## 2017-04-28 DIAGNOSIS — H409 Unspecified glaucoma: Secondary | ICD-10-CM | POA: Diagnosis not present

## 2017-04-28 DIAGNOSIS — I130 Hypertensive heart and chronic kidney disease with heart failure and stage 1 through stage 4 chronic kidney disease, or unspecified chronic kidney disease: Secondary | ICD-10-CM | POA: Diagnosis not present

## 2017-04-28 DIAGNOSIS — E785 Hyperlipidemia, unspecified: Secondary | ICD-10-CM | POA: Diagnosis not present

## 2017-04-28 DIAGNOSIS — N184 Chronic kidney disease, stage 4 (severe): Secondary | ICD-10-CM | POA: Diagnosis not present

## 2017-04-28 DIAGNOSIS — I5033 Acute on chronic diastolic (congestive) heart failure: Secondary | ICD-10-CM | POA: Diagnosis not present

## 2017-04-28 DIAGNOSIS — Z87891 Personal history of nicotine dependence: Secondary | ICD-10-CM | POA: Diagnosis not present

## 2017-04-28 DIAGNOSIS — J9611 Chronic respiratory failure with hypoxia: Secondary | ICD-10-CM | POA: Diagnosis not present

## 2017-05-03 DIAGNOSIS — M1991 Primary osteoarthritis, unspecified site: Secondary | ICD-10-CM | POA: Diagnosis not present

## 2017-05-03 DIAGNOSIS — I252 Old myocardial infarction: Secondary | ICD-10-CM | POA: Diagnosis not present

## 2017-05-03 DIAGNOSIS — I130 Hypertensive heart and chronic kidney disease with heart failure and stage 1 through stage 4 chronic kidney disease, or unspecified chronic kidney disease: Secondary | ICD-10-CM | POA: Diagnosis not present

## 2017-05-03 DIAGNOSIS — J9611 Chronic respiratory failure with hypoxia: Secondary | ICD-10-CM | POA: Diagnosis not present

## 2017-05-03 DIAGNOSIS — Z87891 Personal history of nicotine dependence: Secondary | ICD-10-CM | POA: Diagnosis not present

## 2017-05-03 DIAGNOSIS — E785 Hyperlipidemia, unspecified: Secondary | ICD-10-CM | POA: Diagnosis not present

## 2017-05-03 DIAGNOSIS — I5033 Acute on chronic diastolic (congestive) heart failure: Secondary | ICD-10-CM | POA: Diagnosis not present

## 2017-05-03 DIAGNOSIS — N184 Chronic kidney disease, stage 4 (severe): Secondary | ICD-10-CM | POA: Diagnosis not present

## 2017-05-03 DIAGNOSIS — E1122 Type 2 diabetes mellitus with diabetic chronic kidney disease: Secondary | ICD-10-CM | POA: Diagnosis not present

## 2017-05-03 DIAGNOSIS — H409 Unspecified glaucoma: Secondary | ICD-10-CM | POA: Diagnosis not present

## 2017-05-09 DIAGNOSIS — Z87891 Personal history of nicotine dependence: Secondary | ICD-10-CM | POA: Diagnosis not present

## 2017-05-09 DIAGNOSIS — J9611 Chronic respiratory failure with hypoxia: Secondary | ICD-10-CM | POA: Diagnosis not present

## 2017-05-09 DIAGNOSIS — I130 Hypertensive heart and chronic kidney disease with heart failure and stage 1 through stage 4 chronic kidney disease, or unspecified chronic kidney disease: Secondary | ICD-10-CM | POA: Diagnosis not present

## 2017-05-09 DIAGNOSIS — E1122 Type 2 diabetes mellitus with diabetic chronic kidney disease: Secondary | ICD-10-CM | POA: Diagnosis not present

## 2017-05-09 DIAGNOSIS — I5033 Acute on chronic diastolic (congestive) heart failure: Secondary | ICD-10-CM | POA: Diagnosis not present

## 2017-05-09 DIAGNOSIS — H409 Unspecified glaucoma: Secondary | ICD-10-CM | POA: Diagnosis not present

## 2017-05-09 DIAGNOSIS — M1991 Primary osteoarthritis, unspecified site: Secondary | ICD-10-CM | POA: Diagnosis not present

## 2017-05-09 DIAGNOSIS — I252 Old myocardial infarction: Secondary | ICD-10-CM | POA: Diagnosis not present

## 2017-05-09 DIAGNOSIS — N184 Chronic kidney disease, stage 4 (severe): Secondary | ICD-10-CM | POA: Diagnosis not present

## 2017-05-09 DIAGNOSIS — E785 Hyperlipidemia, unspecified: Secondary | ICD-10-CM | POA: Diagnosis not present

## 2017-05-15 DIAGNOSIS — R0602 Shortness of breath: Secondary | ICD-10-CM | POA: Diagnosis not present

## 2017-05-15 NOTE — Telephone Encounter (Signed)
Unable to LM as voicemail is full.

## 2017-05-17 ENCOUNTER — Telehealth: Payer: Self-pay | Admitting: Family Medicine

## 2017-05-17 NOTE — Telephone Encounter (Signed)
Last office note does not mention this order. Please Mackie Pai, RMA

## 2017-05-17 NOTE — Telephone Encounter (Signed)
Patient states that when she was here on 04/18/2017 she discussed with you about getting a portable oxygen tank.  She states that she was told that a order would be put in for this but she has not heard anything about it.  She states that she is not able to leave her house without this.  Please call patient and let her know what is going on with this.

## 2017-05-18 ENCOUNTER — Telehealth: Payer: Self-pay | Admitting: Family Medicine

## 2017-05-18 DIAGNOSIS — I130 Hypertensive heart and chronic kidney disease with heart failure and stage 1 through stage 4 chronic kidney disease, or unspecified chronic kidney disease: Secondary | ICD-10-CM | POA: Diagnosis not present

## 2017-05-18 DIAGNOSIS — I252 Old myocardial infarction: Secondary | ICD-10-CM | POA: Diagnosis not present

## 2017-05-18 DIAGNOSIS — E785 Hyperlipidemia, unspecified: Secondary | ICD-10-CM | POA: Diagnosis not present

## 2017-05-18 DIAGNOSIS — H409 Unspecified glaucoma: Secondary | ICD-10-CM | POA: Diagnosis not present

## 2017-05-18 DIAGNOSIS — Z87891 Personal history of nicotine dependence: Secondary | ICD-10-CM | POA: Diagnosis not present

## 2017-05-18 DIAGNOSIS — N184 Chronic kidney disease, stage 4 (severe): Secondary | ICD-10-CM | POA: Diagnosis not present

## 2017-05-18 DIAGNOSIS — M1991 Primary osteoarthritis, unspecified site: Secondary | ICD-10-CM | POA: Diagnosis not present

## 2017-05-18 DIAGNOSIS — E1122 Type 2 diabetes mellitus with diabetic chronic kidney disease: Secondary | ICD-10-CM | POA: Diagnosis not present

## 2017-05-18 DIAGNOSIS — I5033 Acute on chronic diastolic (congestive) heart failure: Secondary | ICD-10-CM | POA: Diagnosis not present

## 2017-05-18 DIAGNOSIS — J9611 Chronic respiratory failure with hypoxia: Secondary | ICD-10-CM | POA: Diagnosis not present

## 2017-05-18 NOTE — Telephone Encounter (Signed)
Ok to order? Please advise.

## 2017-05-18 NOTE — Telephone Encounter (Signed)
Lori Stanley (with a home health) calling stating that the pt needs a prescription for a portable oxygen tank [facility].  Fax # [phone]. Thanks CC

## 2017-05-23 ENCOUNTER — Ambulatory Visit: Payer: Self-pay | Admitting: Family Medicine

## 2017-05-23 NOTE — Telephone Encounter (Signed)
Attempted to reach patient but no answer. 

## 2017-05-26 ENCOUNTER — Telehealth: Payer: Self-pay | Admitting: Family Medicine

## 2017-05-26 DIAGNOSIS — M1991 Primary osteoarthritis, unspecified site: Secondary | ICD-10-CM | POA: Diagnosis not present

## 2017-05-26 DIAGNOSIS — J9611 Chronic respiratory failure with hypoxia: Secondary | ICD-10-CM | POA: Diagnosis not present

## 2017-05-26 DIAGNOSIS — H409 Unspecified glaucoma: Secondary | ICD-10-CM | POA: Diagnosis not present

## 2017-05-26 DIAGNOSIS — I252 Old myocardial infarction: Secondary | ICD-10-CM | POA: Diagnosis not present

## 2017-05-26 DIAGNOSIS — I5033 Acute on chronic diastolic (congestive) heart failure: Secondary | ICD-10-CM | POA: Diagnosis not present

## 2017-05-26 DIAGNOSIS — I130 Hypertensive heart and chronic kidney disease with heart failure and stage 1 through stage 4 chronic kidney disease, or unspecified chronic kidney disease: Secondary | ICD-10-CM | POA: Diagnosis not present

## 2017-05-26 DIAGNOSIS — E785 Hyperlipidemia, unspecified: Secondary | ICD-10-CM | POA: Diagnosis not present

## 2017-05-26 DIAGNOSIS — N184 Chronic kidney disease, stage 4 (severe): Secondary | ICD-10-CM | POA: Diagnosis not present

## 2017-05-26 DIAGNOSIS — E1122 Type 2 diabetes mellitus with diabetic chronic kidney disease: Secondary | ICD-10-CM | POA: Diagnosis not present

## 2017-05-26 DIAGNOSIS — Z87891 Personal history of nicotine dependence: Secondary | ICD-10-CM | POA: Diagnosis not present

## 2017-05-26 NOTE — Telephone Encounter (Signed)
Lori Stanley with [name] is requesting written orders for a portable oxygen system. Lori Stanley stated that pt is confined to her home because she doesn't have the ability to take her oxygen with her. Fax# [phone] Please advise. Thanks TNP

## 2017-05-29 NOTE — Telephone Encounter (Signed)
Will you do this or should she be seeing pulmonary.  I think she was put on the O2 when in the hospital.

## 2017-06-02 DIAGNOSIS — I252 Old myocardial infarction: Secondary | ICD-10-CM | POA: Diagnosis not present

## 2017-06-02 DIAGNOSIS — E1122 Type 2 diabetes mellitus with diabetic chronic kidney disease: Secondary | ICD-10-CM | POA: Diagnosis not present

## 2017-06-02 DIAGNOSIS — J9611 Chronic respiratory failure with hypoxia: Secondary | ICD-10-CM | POA: Diagnosis not present

## 2017-06-02 DIAGNOSIS — M1991 Primary osteoarthritis, unspecified site: Secondary | ICD-10-CM | POA: Diagnosis not present

## 2017-06-02 DIAGNOSIS — E785 Hyperlipidemia, unspecified: Secondary | ICD-10-CM | POA: Diagnosis not present

## 2017-06-02 DIAGNOSIS — I130 Hypertensive heart and chronic kidney disease with heart failure and stage 1 through stage 4 chronic kidney disease, or unspecified chronic kidney disease: Secondary | ICD-10-CM | POA: Diagnosis not present

## 2017-06-02 DIAGNOSIS — H409 Unspecified glaucoma: Secondary | ICD-10-CM | POA: Diagnosis not present

## 2017-06-02 DIAGNOSIS — I5033 Acute on chronic diastolic (congestive) heart failure: Secondary | ICD-10-CM | POA: Diagnosis not present

## 2017-06-02 DIAGNOSIS — N184 Chronic kidney disease, stage 4 (severe): Secondary | ICD-10-CM | POA: Diagnosis not present

## 2017-06-02 DIAGNOSIS — Z87891 Personal history of nicotine dependence: Secondary | ICD-10-CM | POA: Diagnosis not present

## 2017-06-02 NOTE — Telephone Encounter (Signed)
Please advise-Anastasiya V Hopkins, RMA  

## 2017-06-02 NOTE — Telephone Encounter (Signed)
Lori Stanley with [name] is calling back for an update on the written order for the portable O2 that was requested on 05/26/17. Lori Stanley stated that pt has a family member's funeral Tuesday 06/06/17 that she won't be able to attend without the portable O2. Lori Stanley is requesting a call back to up date her on the order. Please advise. Thanks TNP

## 2017-06-07 NOTE — Telephone Encounter (Signed)
[facility] states she already has one at home.  Sister to call them directly

## 2017-06-08 DIAGNOSIS — J9611 Chronic respiratory failure with hypoxia: Secondary | ICD-10-CM | POA: Diagnosis not present

## 2017-06-08 DIAGNOSIS — I252 Old myocardial infarction: Secondary | ICD-10-CM | POA: Diagnosis not present

## 2017-06-08 DIAGNOSIS — M1991 Primary osteoarthritis, unspecified site: Secondary | ICD-10-CM | POA: Diagnosis not present

## 2017-06-08 DIAGNOSIS — N184 Chronic kidney disease, stage 4 (severe): Secondary | ICD-10-CM | POA: Diagnosis not present

## 2017-06-08 DIAGNOSIS — Z87891 Personal history of nicotine dependence: Secondary | ICD-10-CM | POA: Diagnosis not present

## 2017-06-08 DIAGNOSIS — E1122 Type 2 diabetes mellitus with diabetic chronic kidney disease: Secondary | ICD-10-CM | POA: Diagnosis not present

## 2017-06-08 DIAGNOSIS — I5033 Acute on chronic diastolic (congestive) heart failure: Secondary | ICD-10-CM | POA: Diagnosis not present

## 2017-06-08 DIAGNOSIS — I130 Hypertensive heart and chronic kidney disease with heart failure and stage 1 through stage 4 chronic kidney disease, or unspecified chronic kidney disease: Secondary | ICD-10-CM | POA: Diagnosis not present

## 2017-06-08 DIAGNOSIS — E785 Hyperlipidemia, unspecified: Secondary | ICD-10-CM | POA: Diagnosis not present

## 2017-06-08 DIAGNOSIS — H409 Unspecified glaucoma: Secondary | ICD-10-CM | POA: Diagnosis not present

## 2017-06-09 DIAGNOSIS — E1122 Type 2 diabetes mellitus with diabetic chronic kidney disease: Secondary | ICD-10-CM | POA: Diagnosis not present

## 2017-06-09 DIAGNOSIS — J9611 Chronic respiratory failure with hypoxia: Secondary | ICD-10-CM | POA: Diagnosis not present

## 2017-06-09 DIAGNOSIS — E785 Hyperlipidemia, unspecified: Secondary | ICD-10-CM | POA: Diagnosis not present

## 2017-06-09 DIAGNOSIS — I5033 Acute on chronic diastolic (congestive) heart failure: Secondary | ICD-10-CM | POA: Diagnosis not present

## 2017-06-09 DIAGNOSIS — H409 Unspecified glaucoma: Secondary | ICD-10-CM | POA: Diagnosis not present

## 2017-06-09 DIAGNOSIS — N184 Chronic kidney disease, stage 4 (severe): Secondary | ICD-10-CM | POA: Diagnosis not present

## 2017-06-09 DIAGNOSIS — I130 Hypertensive heart and chronic kidney disease with heart failure and stage 1 through stage 4 chronic kidney disease, or unspecified chronic kidney disease: Secondary | ICD-10-CM | POA: Diagnosis not present

## 2017-06-09 DIAGNOSIS — I252 Old myocardial infarction: Secondary | ICD-10-CM | POA: Diagnosis not present

## 2017-06-09 DIAGNOSIS — M1991 Primary osteoarthritis, unspecified site: Secondary | ICD-10-CM | POA: Diagnosis not present

## 2017-06-09 DIAGNOSIS — Z87891 Personal history of nicotine dependence: Secondary | ICD-10-CM | POA: Diagnosis not present

## 2017-06-15 DIAGNOSIS — R0602 Shortness of breath: Secondary | ICD-10-CM | POA: Diagnosis not present

## 2017-07-05 ENCOUNTER — Ambulatory Visit (INDEPENDENT_AMBULATORY_CARE_PROVIDER_SITE_OTHER): Payer: Medicare Other | Admitting: Family Medicine

## 2017-07-05 ENCOUNTER — Encounter: Payer: Self-pay | Admitting: Family Medicine

## 2017-07-05 VITALS — BP 128/64 | HR 76 | Temp 97.6°F | Resp 16 | Wt 140.0 lb

## 2017-07-05 DIAGNOSIS — I10 Essential (primary) hypertension: Secondary | ICD-10-CM | POA: Diagnosis not present

## 2017-07-05 DIAGNOSIS — E1121 Type 2 diabetes mellitus with diabetic nephropathy: Secondary | ICD-10-CM

## 2017-07-05 DIAGNOSIS — B351 Tinea unguium: Secondary | ICD-10-CM | POA: Diagnosis not present

## 2017-07-05 DIAGNOSIS — E785 Hyperlipidemia, unspecified: Secondary | ICD-10-CM | POA: Diagnosis not present

## 2017-07-05 DIAGNOSIS — E119 Type 2 diabetes mellitus without complications: Secondary | ICD-10-CM | POA: Diagnosis not present

## 2017-07-05 DIAGNOSIS — J189 Pneumonia, unspecified organism: Secondary | ICD-10-CM

## 2017-07-05 DIAGNOSIS — J181 Lobar pneumonia, unspecified organism: Secondary | ICD-10-CM | POA: Diagnosis not present

## 2017-07-05 LAB — POCT GLYCOSYLATED HEMOGLOBIN (HGB A1C): Hemoglobin A1C: 8.5

## 2017-07-05 NOTE — Progress Notes (Signed)
Patient: Lori Stanley Female    DOB: 10-May-1931   84 y.o.   MRN: [id number] Visit Date: 07/05/2017  Today's Provider: [name], MD   Chief Complaint  Patient presents with  . Hypertension  . Hyperlipidemia  . Diabetes   Subjective:    HPI   Diabetes Mellitus Type II, Follow-up:   Lab Results  Component Value Date   HGBA1C 8.5 07/05/2017   HGBA1C 7.8 (H) 12/26/2016   HGBA1C 9.1 08/22/2016   Last seen for diabetes 6 months ago.  Management since then includes None. She reports excellent compliance with treatment. She is not having side effects.  Current symptoms include polyuria and have been stable. Home blood sugar records: 90's-low 100's  Episodes of hypoglycemia? no   Most Recent Eye Exam: UTD Weight trend: decreasing Prior visit with dietician: no Current diet: in general, a "healthy" diet   Current exercise: some  ------------------------------------------------------------------------   Hypertension, follow-up:  BP Readings from Last 3 Encounters:  07/05/17 128/64  04/18/17 (!) 142/60  04/13/17 (!) 155/59    She was last seen for hypertension 2 months ago.  Management since that visit includes None.She reports excellent compliance with treatment. She is not having side effects.  She is exercising. She is adherent to low salt diet.   Outside blood pressures are: Pt reports she is not checking her blood pressures at home. She is experiencing none.  Patient denies chest pain, dyspnea, exertional chest pressure/discomfort and near-syncope.   Cardiovascular risk factors include advanced age (older than 9 for men, 65 for women), diabetes mellitus, dyslipidemia and hypertension.  Use of agents associated with hypertension: none.   ------------------------------------------------------------------------    Lipid/Cholesterol, Follow-up:   Last seen for this 6 months ago.  Management since that visit includes None.  Last Lipid  Panel:    Component Value Date/Time   CHOL 138 12/26/2016 1239   CHOL 137 05/13/2013 0344   TRIG 149 12/26/2016 1239   TRIG 104 05/13/2013 0344   HDL 44 12/26/2016 1239   HDL 43 05/13/2013 0344   CHOLHDL 2.9 12/15/2015 1546   VLDL 21 05/13/2013 0344   LDLCALC 64 12/26/2016 1239   LDLCALC 73 05/13/2013 0344    She reports excellent compliance with treatment. She is not having side effects.   Wt Readings from Last 3 Encounters:  07/05/17 140 lb (63.5 kg)  04/11/17 160 lb 1.6 oz (72.6 kg)  01/17/17 152 lb (68.9 kg)    ------------------------------------------------------------------------       Allergies  Allergen Reactions  . Antihistamines, Chlorpheniramine-Type   . Codeine   . Paxil [Paroxetine]     "makes her feel funny" not in a good way     Current Outpatient Medications:  .  acetaminophen (TYLENOL) 500 MG tablet, Take 1,000 mg by mouth every 6 (six) hours as needed., Disp: , Rfl:  .  albuterol (PROVENTIL HFA;VENTOLIN HFA) 108 (90 Base) MCG/ACT inhaler, Inhale 2 puffs into the lungs every 6 (six) hours as needed for wheezing or shortness of breath., Disp: 1 Inhaler, Rfl: 0 .  amLODipine (NORVASC) 5 MG tablet, Take 1 tablet (5 mg total) by mouth daily., Disp: 90 tablet, Rfl: 3 .  aspirin 81 MG EC tablet, TAKE 1 TABLET (81 MG TOTAL) BY MOUTH DAILY., Disp: 90 tablet, Rfl: 3 .  azithromycin (ZITHROMAX) 250 MG tablet, Take 1 tablet (250 mg total) by mouth daily., Disp: 2 tablet, Rfl: 0 .  B-D UF III MINI PEN  NEEDLES 31G X 5 MM MISC, USE TWICE A DAY AS DIRECTED, Disp: 100 each, Rfl: 5 .  Bepotastine Besilate (BEPREVE) 1.5 % SOLN, Place 1 drop into both eyes daily., Disp: , Rfl:  .  carvedilol (COREG) 12.5 MG tablet, TAKE 1 TABLET BY MOUTH TWICE A DAY, Disp: 180 tablet, Rfl: 3 .  cefUROXime (CEFTIN) 250 MG tablet, Take 1 tablet (250 mg total) by mouth 2 (two) times daily with a meal., Disp: 8 tablet, Rfl: 0 .  clonazePAM (KLONOPIN) 0.5 MG tablet, TAKE 1 TABLET BY MOUTH  TWICE A DAY AS NEEDED, Disp: 60 tablet, Rfl: 4 .  dorzolamide (TRUSOPT) 2 % ophthalmic solution, Place 1 drop into both eyes 3 (three) times daily., Disp: 10 mL, Rfl: 0 .  furosemide (LASIX) 40 MG tablet, Take 1 tablet (40 mg total) by mouth daily., Disp: 30 tablet, Rfl: 0 .  Insulin Lispro Prot & Lispro (HUMALOG MIX 75/25 KWIKPEN) (75-25) 100 UNIT/ML Kwikpen, Inject 6 Units into the skin 2 (two) times daily., Disp: 16 pen, Rfl: 12 .  latanoprost (XALATAN) 0.005 % ophthalmic solution, Place 1 drop into the left eye at bedtime. , Disp: , Rfl:  .  loratadine (CLARITIN) 10 MG tablet, Take 1 tablet (10 mg total) by mouth daily., Disp: 90 tablet, Rfl: 3 .  quinapril (ACCUPRIL) 40 MG tablet, TAKE 1 TABLET BY MOUTH EVERY DAY, Disp: 90 tablet, Rfl: 3 .  ranitidine (ZANTAC) 150 MG capsule, Take 1 capsule (150 mg total) by mouth 2 (two) times daily., Disp: 30 capsule, Rfl: 12 .  simvastatin (ZOCOR) 40 MG tablet, TAKE 1 TABLET (40 MG TOTAL) BY MOUTH DAILY., Disp: 30 tablet, Rfl: 12 .  traZODone (DESYREL) 150 MG tablet, Take 1 tablet (150 mg total) by mouth at bedtime., Disp: 90 tablet, Rfl: 3  Review of Systems  Constitutional: Positive for unexpected weight change. Negative for activity change, appetite change, chills, diaphoresis, fatigue and fever.  Respiratory: Negative.   Cardiovascular: Positive for leg swelling. Negative for chest pain and palpitations.  Gastrointestinal: Negative.   Musculoskeletal: Negative.   Neurological: Positive for dizziness. Negative for light-headedness and headaches.  Psychiatric/Behavioral: Negative.     Social History   Tobacco Use  . Smoking status: Former Smoker    Types: Cigarettes  . Smokeless tobacco: Never Used  Substance Use Topics  . Alcohol use: No   Objective:   BP 128/64 (BP Location: Left Arm, Patient Position: Sitting, Cuff Size: Normal)   Pulse 76   Temp 97.6 F (36.4 C) (Oral)   Resp 16   Wt 140 lb (63.5 kg)   SpO2 98%   BMI 24.80 kg/m    Vitals:   07/05/17 1102  BP: 128/64  Pulse: 76  Resp: 16  Temp: 97.6 F (36.4 C)  TempSrc: Oral  SpO2: 98%  Weight: 140 lb (63.5 kg)     Physical Exam  Constitutional: She is oriented to person, place, and time. She appears well-developed and well-nourished.  HENT:  Head: Normocephalic and atraumatic.  Right Ear: External ear normal.  Left Ear: External ear normal.  Nose: Nose normal.  Neurological: She is alert and oriented to person, place, and time.  Skin: Skin is warm and dry.  Psychiatric: She has a normal mood and affect. Her behavior is normal. Judgment and thought content normal.        Assessment & Plan:     1. Type 2 diabetes mellitus with diabetic nephropathy, without long-term current use of insulin ([facility]) A1C slightly  higher at 8.5%, (was 7.8% 12/26/2016).   - POCT glycosylated hemoglobin (Hb A1C)  2. Essential (primary) hypertension Stable; continue current medications.   3. Hyperlipidemia, unspecified hyperlipidemia type Stable continue current medications.  4.COPD  O2 sat 98%. 5.LLL Pneumonia Needs CXR f/u from 12/19. Patient seen and examined by Miguel Aschoff, MD, and note scribed by Ashley Royalty, CMA       I have done the exam and reviewed the above chart and it is accurate to the best of my knowledge. Development worker, community has been used in this note in any air is in the dictation or transcription are unintentional.  [name], MD  [place]

## 2017-07-13 DIAGNOSIS — R0602 Shortness of breath: Secondary | ICD-10-CM | POA: Diagnosis not present

## 2017-08-13 DIAGNOSIS — R0602 Shortness of breath: Secondary | ICD-10-CM | POA: Diagnosis not present

## 2017-08-28 ENCOUNTER — Ambulatory Visit (INDEPENDENT_AMBULATORY_CARE_PROVIDER_SITE_OTHER): Payer: Medicare Other | Admitting: Family Medicine

## 2017-08-28 VITALS — BP 132/58 | HR 80 | Temp 97.8°F | Resp 16 | Wt 141.8 lb

## 2017-08-28 DIAGNOSIS — D649 Anemia, unspecified: Secondary | ICD-10-CM

## 2017-08-28 DIAGNOSIS — R634 Abnormal weight loss: Secondary | ICD-10-CM

## 2017-08-28 DIAGNOSIS — E1121 Type 2 diabetes mellitus with diabetic nephropathy: Secondary | ICD-10-CM | POA: Diagnosis not present

## 2017-08-28 DIAGNOSIS — N185 Chronic kidney disease, stage 5: Secondary | ICD-10-CM | POA: Diagnosis not present

## 2017-08-28 DIAGNOSIS — I120 Hypertensive chronic kidney disease with stage 5 chronic kidney disease or end stage renal disease: Secondary | ICD-10-CM

## 2017-08-28 DIAGNOSIS — I10 Essential (primary) hypertension: Secondary | ICD-10-CM

## 2017-08-28 NOTE — Progress Notes (Signed)
Patient: Lori Stanley Female    DOB: Sep 05, 1931   84 y.o.   MRN: [id number] Visit Date: 08/28/2017  Today's Provider: [name], MD   Chief Complaint  Patient presents with  . Weight Loss   Subjective:    HPI Pt is here today for a follow up of weight loss. She reports that she feels like she eats enough but does not have much of an appetite. Her weight is stable from last OV. On 07/05/17 her weight was 140. Today she weighs 141.8. Pt did not have CXR done from the follow up of pneumonia. Pt reports that she has been feeling well.  Overall she feels stable.      Allergies  Allergen Reactions  . Antihistamines, Chlorpheniramine-Type   . Codeine   . Paxil [Paroxetine]     "makes her feel funny" not in a good way     Current Outpatient Medications:  .  acetaminophen (TYLENOL) 500 MG tablet, Take 1,000 mg by mouth every 6 (six) hours as needed., Disp: , Rfl:  .  albuterol (PROVENTIL HFA;VENTOLIN HFA) 108 (90 Base) MCG/ACT inhaler, Inhale 2 puffs into the lungs every 6 (six) hours as needed for wheezing or shortness of breath., Disp: 1 Inhaler, Rfl: 0 .  amLODipine (NORVASC) 5 MG tablet, Take 1 tablet (5 mg total) by mouth daily., Disp: 90 tablet, Rfl: 3 .  aspirin 81 MG EC tablet, TAKE 1 TABLET (81 MG TOTAL) BY MOUTH DAILY., Disp: 90 tablet, Rfl: 3 .  B-D UF III MINI PEN NEEDLES 31G X 5 MM MISC, USE TWICE A DAY AS DIRECTED, Disp: 100 each, Rfl: 5 .  Bepotastine Besilate (BEPREVE) 1.5 % SOLN, Place 1 drop into both eyes daily., Disp: , Rfl:  .  carvedilol (COREG) 12.5 MG tablet, TAKE 1 TABLET BY MOUTH TWICE A DAY, Disp: 180 tablet, Rfl: 3 .  clonazePAM (KLONOPIN) 0.5 MG tablet, TAKE 1 TABLET BY MOUTH TWICE A DAY AS NEEDED, Disp: 60 tablet, Rfl: 4 .  dorzolamide (TRUSOPT) 2 % ophthalmic solution, Place 1 drop into both eyes 3 (three) times daily., Disp: 10 mL, Rfl: 0 .  furosemide (LASIX) 40 MG tablet, Take 1 tablet (40 mg total) by mouth daily., Disp: 30 tablet,  Rfl: 0 .  Insulin Lispro Prot & Lispro (HUMALOG MIX 75/25 KWIKPEN) (75-25) 100 UNIT/ML Kwikpen, Inject 6 Units into the skin 2 (two) times daily., Disp: 16 pen, Rfl: 12 .  latanoprost (XALATAN) 0.005 % ophthalmic solution, Place 1 drop into the left eye at bedtime. , Disp: , Rfl:  .  loratadine (CLARITIN) 10 MG tablet, Take 1 tablet (10 mg total) by mouth daily., Disp: 90 tablet, Rfl: 3 .  quinapril (ACCUPRIL) 40 MG tablet, TAKE 1 TABLET BY MOUTH EVERY DAY, Disp: 90 tablet, Rfl: 3 .  ranitidine (ZANTAC) 150 MG capsule, Take 1 capsule (150 mg total) by mouth 2 (two) times daily., Disp: 30 capsule, Rfl: 12 .  simvastatin (ZOCOR) 40 MG tablet, TAKE 1 TABLET (40 MG TOTAL) BY MOUTH DAILY., Disp: 30 tablet, Rfl: 12 .  traZODone (DESYREL) 150 MG tablet, Take 1 tablet (150 mg total) by mouth at bedtime., Disp: 90 tablet, Rfl: 3 .  Vitamin D, Ergocalciferol, (DRISDOL) 50000 units CAPS capsule, Take 50,000 Units by mouth every 7 (seven) days., Disp: , Rfl:  .  azithromycin (ZITHROMAX) 250 MG tablet, Take 1 tablet (250 mg total) by mouth daily. (Patient not taking: Reported on 08/28/2017), Disp: 2 tablet,  Rfl: 0 .  cefUROXime (CEFTIN) 250 MG tablet, Take 1 tablet (250 mg total) by mouth 2 (two) times daily with a meal. (Patient not taking: Reported on 08/28/2017), Disp: 8 tablet, Rfl: 0  Review of Systems  Constitutional: Negative.   HENT: Negative.   Eyes: Negative.   Respiratory: Negative.   Cardiovascular: Negative.   Gastrointestinal: Negative.   Endocrine: Negative.   Genitourinary: Negative.   Musculoskeletal: Positive for back pain.  Skin: Negative.   Allergic/Immunologic: Negative.   Neurological: Negative.   Hematological: Negative.   Psychiatric/Behavioral: Negative.     Social History   Tobacco Use  . Smoking status: Former Smoker    Types: Cigarettes  . Smokeless tobacco: Never Used  Substance Use Topics  . Alcohol use: No   Objective:   BP (!) 132/58 (BP Location: Left Arm,  Patient Position: Sitting, Cuff Size: Normal)   Pulse 80   Temp 97.8 F (36.6 C) (Oral)   Resp 16   Wt 141 lb 12.8 oz (64.3 kg)   SpO2 96%   BMI 25.12 kg/m  Vitals:   08/28/17 1037  BP: (!) 132/58  Pulse: 80  Resp: 16  Temp: 97.8 F (36.6 C)  TempSrc: Oral  SpO2: 96%  Weight: 141 lb 12.8 oz (64.3 kg)     Physical Exam  Constitutional: She is oriented to person, place, and time. She appears well-developed and well-nourished.  HENT:  Head: Normocephalic and atraumatic.  Eyes: No scleral icterus.  Neck: No thyromegaly present.  Cardiovascular: Normal rate, regular rhythm and normal heart sounds.  Pulmonary/Chest: Effort normal and breath sounds normal.  Abdominal: Soft.  Musculoskeletal: She exhibits edema.  1+ edema.  Lymphadenopathy:    She has no cervical adenopathy.  Neurological: She is alert and oriented to person, place, and time.  Skin: Skin is warm and dry.  Psychiatric: She has a normal mood and affect. Her behavior is normal. Judgment and thought content normal.        Assessment & Plan:     1. Essential (primary) hypertension  - TSH  2. Type 2 diabetes mellitus with diabetic nephropathy, without long-term current use of insulin (HCC) Last A1C 8.5.  3. Anemia, unspecified type  - CBC with Differential/Platelet  4. Weight loss  - TSH  5. Hypertensive kidney disease with CKD (chronic kidney disease) stage V (HCC)  - Comprehensive metabolic panel      I have done the exam and reviewed the above chart and it is accurate to the best of my knowledge. Development worker, community has been used in this note in any air is in the dictation or transcription are unintentional.  [name], MD  [place]

## 2017-08-29 LAB — COMPREHENSIVE METABOLIC PANEL
ALT: 11 IU/L (ref 0–32)
AST: 14 IU/L (ref 0–40)
Albumin/Globulin Ratio: 1.9 (ref 1.2–2.2)
Albumin: 4.2 g/dL (ref 3.5–4.7)
Alkaline Phosphatase: 68 IU/L (ref 39–117)
BUN/Creatinine Ratio: 16 (ref 12–28)
BUN: 42 mg/dL — AB (ref 8–27)
CHLORIDE: 103 mmol/L (ref 96–106)
CO2: 21 mmol/L (ref 20–29)
CREATININE: 2.67 mg/dL — AB (ref 0.57–1.00)
Calcium: 9.6 mg/dL (ref 8.7–10.3)
GFR calc non Af Amer: 16 mL/min/{1.73_m2} — ABNORMAL LOW (ref >59)
GFR, EST AFRICAN AMERICAN: 18 mL/min/{1.73_m2} — AB (ref >59)
Globulin, Total: 2.2 g/dL (ref 1.5–4.5)
Glucose: 274 mg/dL — ABNORMAL HIGH (ref 65–99)
Potassium: 4.5 mmol/L (ref 3.5–5.2)
Sodium: 141 mmol/L (ref 134–144)
TOTAL PROTEIN: 6.4 g/dL (ref 6.0–8.5)

## 2017-08-29 LAB — CBC WITH DIFFERENTIAL/PLATELET
Basophils Absolute: 0 10*3/uL (ref 0.0–0.2)
Basos: 0 %
EOS (ABSOLUTE): 0.1 10*3/uL (ref 0.0–0.4)
Eos: 1 %
HEMOGLOBIN: 9.7 g/dL — AB (ref 11.1–15.9)
Hematocrit: 32.4 % — ABNORMAL LOW (ref 34.0–46.6)
IMMATURE GRANS (ABS): 0 10*3/uL (ref 0.0–0.1)
IMMATURE GRANULOCYTES: 0 %
LYMPHS: 21 %
Lymphocytes Absolute: 1.2 10*3/uL (ref 0.7–3.1)
MCH: 26.1 pg — ABNORMAL LOW (ref 26.6–33.0)
MCHC: 29.9 g/dL — ABNORMAL LOW (ref 31.5–35.7)
MCV: 87 fL (ref 79–97)
MONOCYTES: 5 %
Monocytes Absolute: 0.3 10*3/uL (ref 0.1–0.9)
NEUTROS PCT: 73 %
Neutrophils Absolute: 4.2 10*3/uL (ref 1.4–7.0)
PLATELETS: 208 10*3/uL (ref 150–379)
RBC: 3.71 x10E6/uL — ABNORMAL LOW (ref 3.77–5.28)
RDW: 15.2 % (ref 12.3–15.4)
WBC: 5.7 10*3/uL (ref 3.4–10.8)

## 2017-08-29 LAB — TSH: TSH: 1.71 u[IU]/mL (ref 0.450–4.500)

## 2017-09-05 DIAGNOSIS — H401132 Primary open-angle glaucoma, bilateral, moderate stage: Secondary | ICD-10-CM | POA: Diagnosis not present

## 2017-09-05 DIAGNOSIS — H52223 Regular astigmatism, bilateral: Secondary | ICD-10-CM | POA: Diagnosis not present

## 2017-09-08 ENCOUNTER — Telehealth: Payer: Self-pay

## 2017-09-08 NOTE — Telephone Encounter (Signed)
Unable to reach patient at this time voicemail box is full will try and contact patient again at a later time. KW

## 2017-09-08 NOTE — Telephone Encounter (Signed)
-----   Message from Jerrol Banana., MD sent at 09/07/2017  3:47 PM EDT ----- stable

## 2017-09-12 DIAGNOSIS — R0602 Shortness of breath: Secondary | ICD-10-CM | POA: Diagnosis not present

## 2017-09-21 ENCOUNTER — Other Ambulatory Visit: Payer: Self-pay | Admitting: Family Medicine

## 2017-10-03 ENCOUNTER — Encounter (INDEPENDENT_AMBULATORY_CARE_PROVIDER_SITE_OTHER): Payer: Medicare Other | Admitting: Ophthalmology

## 2017-10-03 DIAGNOSIS — E11319 Type 2 diabetes mellitus with unspecified diabetic retinopathy without macular edema: Secondary | ICD-10-CM | POA: Diagnosis not present

## 2017-10-03 DIAGNOSIS — E113593 Type 2 diabetes mellitus with proliferative diabetic retinopathy without macular edema, bilateral: Secondary | ICD-10-CM | POA: Diagnosis not present

## 2017-10-03 DIAGNOSIS — H35033 Hypertensive retinopathy, bilateral: Secondary | ICD-10-CM

## 2017-10-03 DIAGNOSIS — I10 Essential (primary) hypertension: Secondary | ICD-10-CM | POA: Diagnosis not present

## 2017-10-03 LAB — HM DIABETES EYE EXAM

## 2017-10-05 DIAGNOSIS — B351 Tinea unguium: Secondary | ICD-10-CM | POA: Diagnosis not present

## 2017-10-05 DIAGNOSIS — E119 Type 2 diabetes mellitus without complications: Secondary | ICD-10-CM | POA: Diagnosis not present

## 2017-10-13 DIAGNOSIS — R0602 Shortness of breath: Secondary | ICD-10-CM | POA: Diagnosis not present

## 2017-10-23 ENCOUNTER — Other Ambulatory Visit: Payer: Self-pay | Admitting: Family Medicine

## 2017-11-06 ENCOUNTER — Other Ambulatory Visit: Payer: Self-pay | Admitting: Family Medicine

## 2017-11-06 DIAGNOSIS — I10 Essential (primary) hypertension: Secondary | ICD-10-CM

## 2017-11-12 DIAGNOSIS — R0602 Shortness of breath: Secondary | ICD-10-CM | POA: Diagnosis not present

## 2017-11-27 ENCOUNTER — Ambulatory Visit (INDEPENDENT_AMBULATORY_CARE_PROVIDER_SITE_OTHER): Payer: Medicare Other | Admitting: Family Medicine

## 2017-11-27 ENCOUNTER — Encounter: Payer: Self-pay | Admitting: Family Medicine

## 2017-11-27 VITALS — BP 138/58 | HR 66 | Temp 98.6°F | Resp 20 | Ht 63.0 in | Wt 133.0 lb

## 2017-11-27 DIAGNOSIS — R05 Cough: Secondary | ICD-10-CM | POA: Diagnosis not present

## 2017-11-27 DIAGNOSIS — E1121 Type 2 diabetes mellitus with diabetic nephropathy: Secondary | ICD-10-CM | POA: Diagnosis not present

## 2017-11-27 DIAGNOSIS — D649 Anemia, unspecified: Secondary | ICD-10-CM

## 2017-11-27 DIAGNOSIS — R634 Abnormal weight loss: Secondary | ICD-10-CM | POA: Diagnosis not present

## 2017-11-27 DIAGNOSIS — I10 Essential (primary) hypertension: Secondary | ICD-10-CM | POA: Diagnosis not present

## 2017-11-27 DIAGNOSIS — R058 Other specified cough: Secondary | ICD-10-CM

## 2017-11-27 LAB — POCT GLYCOSYLATED HEMOGLOBIN (HGB A1C)
ESTIMATED AVERAGE GLUCOSE: 171
Hemoglobin A1C: 7.6 % — AB (ref 4.0–5.6)

## 2017-11-27 NOTE — Patient Instructions (Signed)
Stop Quinapril due to cough.

## 2017-11-27 NOTE — Progress Notes (Signed)
Patient: Lori Stanley Female    DOB: November 24, 1931   84 y.o.   MRN: [id number] Visit Date: 11/27/2017  Today's Provider: [name], MD   Chief Complaint  Patient presents with  . Diabetes  . Hypertension  . Hyperlipidemia  . Weight Loss   Subjective:    HPI  Patient actually feels pretty well lately.  She walks with a walker to avoid falls.  She has no specific complaints today.  She has noticed a dry nonproductive cough which feels like a tickle in her throat in recent weeks.  Diabetes Mellitus Type II, Follow-up:   Lab Results  Component Value Date   HGBA1C 7.6 (A) 11/27/2017   HGBA1C 8.5 07/05/2017   HGBA1C 7.8 (H) 12/26/2016    Last seen for diabetes 4 months ago.  Management since then includes no changes. She reports good compliance with treatment. She is not having side effects.  Current symptoms include none and have been stable. Home blood sugar records: fasting range: 120s  Episodes of hypoglycemia? no   Current Insulin Regimen: 75 units Most Recent Eye Exam: up to date Weight trend: decreasing steadily Prior visit with dietician: no Current diet: on average, 3 meals per day Current exercise: aerobics  Pertinent Labs:    Component Value Date/Time   CHOL 138 12/26/2016 1239   CHOL 137 05/13/2013 0344   TRIG 149 12/26/2016 1239   TRIG 104 05/13/2013 0344   HDL 44 12/26/2016 1239   HDL 43 05/13/2013 0344   LDLCALC 64 12/26/2016 1239   LDLCALC 73 05/13/2013 0344   CREATININE 2.67 (H) 08/28/2017 1119   CREATININE 2.27 (H) 05/15/2013 0512    Wt Readings from Last 3 Encounters:  11/27/17 133 lb (60.3 kg)  08/28/17 141 lb 12.8 oz (64.3 kg)  07/05/17 140 lb (63.5 kg)       Hypertension, follow-up:  BP Readings from Last 3 Encounters:  11/27/17 (!) 138/58  08/28/17 (!) 132/58  07/05/17 128/64    She was last seen for hypertension 4 months ago.  BP at that visit was 128/64. Management since that visit includes no  changes. She reports good compliance with treatment. She is not having side effects.  She is not exercising. She is adherent to low salt diet.   Outside blood pressures are checked occasionally. She is experiencing none.  Patient denies none.   Cardiovascular risk factors include dyslipidemia.     Allergies  Allergen Reactions  . Antihistamines, Chlorpheniramine-Type   . Codeine   . Paxil [Paroxetine]     "makes her feel funny" not in a good way     Current Outpatient Medications:  .  acetaminophen (TYLENOL) 500 MG tablet, Take 1,000 mg by mouth every 6 (six) hours as needed., Disp: , Rfl:  .  albuterol (PROVENTIL HFA;VENTOLIN HFA) 108 (90 Base) MCG/ACT inhaler, Inhale 2 puffs into the lungs every 6 (six) hours as needed for wheezing or shortness of breath., Disp: 1 Inhaler, Rfl: 0 .  amLODipine (NORVASC) 5 MG tablet, TAKE 1 TABLET BY MOUTH EVERY DAY, Disp: 90 tablet, Rfl: 3 .  aspirin 81 MG EC tablet, TAKE 1 TABLET (81 MG TOTAL) BY MOUTH DAILY., Disp: 90 tablet, Rfl: 3 .  B-D UF III MINI PEN NEEDLES 31G X 5 MM MISC, USE TWICE A DAY AS DIRECTED, Disp: 100 each, Rfl: 5 .  Bepotastine Besilate (BEPREVE) 1.5 % SOLN, Place 1 drop into both eyes daily., Disp: , Rfl:  .  carvedilol (COREG) 12.5 MG tablet, TAKE 1 TABLET BY MOUTH TWICE A DAY, Disp: 180 tablet, Rfl: 3 .  clonazePAM (KLONOPIN) 0.5 MG tablet, TAKE 1 TABLET BY MOUTH TWICE A DAY AS NEEDED, Disp: 60 tablet, Rfl: 4 .  dorzolamide (TRUSOPT) 2 % ophthalmic solution, Place 1 drop into both eyes 3 (three) times daily., Disp: 10 mL, Rfl: 0 .  furosemide (LASIX) 40 MG tablet, TAKE 1 TABLET BY MOUTH TWICE A DAY, Disp: 180 tablet, Rfl: 1 .  Insulin Lispro Prot & Lispro (HUMALOG MIX 75/25 KWIKPEN) (75-25) 100 UNIT/ML Kwikpen, Inject 6 Units into the skin 2 (two) times daily., Disp: 16 pen, Rfl: 12 .  latanoprost (XALATAN) 0.005 % ophthalmic solution, Place 1 drop into the left eye at bedtime. , Disp: , Rfl:  .  loratadine (CLARITIN) 10 MG  tablet, Take 1 tablet (10 mg total) by mouth daily., Disp: 90 tablet, Rfl: 3 .  quinapril (ACCUPRIL) 40 MG tablet, TAKE 1 TABLET BY MOUTH EVERY DAY, Disp: 90 tablet, Rfl: 3 .  ranitidine (ZANTAC) 150 MG capsule, Take 1 capsule (150 mg total) by mouth 2 (two) times daily., Disp: 30 capsule, Rfl: 12 .  ranitidine (ZANTAC) 150 MG tablet, TAKE 1 TABLET (150 MG TOTAL) BY MOUTH 2 (TWO) TIMES DAILY., Disp: 60 tablet, Rfl: 11 .  simvastatin (ZOCOR) 40 MG tablet, TAKE 1 TABLET (40 MG TOTAL) BY MOUTH DAILY., Disp: 30 tablet, Rfl: 12 .  traZODone (DESYREL) 150 MG tablet, Take 1 tablet (150 mg total) by mouth at bedtime., Disp: 90 tablet, Rfl: 3 .  Vitamin D, Ergocalciferol, (DRISDOL) 50000 units CAPS capsule, Take 50,000 Units by mouth every 7 (seven) days., Disp: , Rfl:  .  azithromycin (ZITHROMAX) 250 MG tablet, Take 1 tablet (250 mg total) by mouth daily. (Patient not taking: Reported on 08/28/2017), Disp: 2 tablet, Rfl: 0 .  cefUROXime (CEFTIN) 250 MG tablet, Take 1 tablet (250 mg total) by mouth 2 (two) times daily with a meal. (Patient not taking: Reported on 08/28/2017), Disp: 8 tablet, Rfl: 0  Review of Systems  Constitutional: Negative for activity change, appetite change, chills, diaphoresis, fatigue, fever and unexpected weight change.  HENT: Negative.   Eyes: Negative.   Respiratory: Negative for cough, shortness of breath and wheezing.   Cardiovascular: Negative for chest pain, palpitations and leg swelling.  Gastrointestinal: Negative for abdominal pain, constipation, diarrhea and nausea.  Endocrine: Negative for cold intolerance, heat intolerance, polydipsia, polyphagia and polyuria.  Genitourinary: Negative.   Musculoskeletal: Positive for arthralgias and gait problem. Negative for joint swelling and myalgias.  Skin:       Chronic skin changes of ankles.  Allergic/Immunologic: Negative for environmental allergies.  Neurological: Negative for dizziness, tremors, weakness, light-headedness,  numbness and headaches.  Psychiatric/Behavioral: Negative.     Social History   Tobacco Use  . Smoking status: Former Smoker    Types: Cigarettes  . Smokeless tobacco: Never Used  Substance Use Topics  . Alcohol use: No   Objective:   BP (!) 138/58 (BP Location: Right Arm, Patient Position: Sitting, Cuff Size: Normal)   Pulse 66   Temp 98.6 F (37 C)   Resp 20   Ht 5\' 3"  (1.6 m)   Wt 133 lb (60.3 kg)   SpO2 99%   BMI 23.56 kg/m  Vitals:   11/27/17 1126  BP: (!) 138/58  Pulse: 66  Resp: 20  Temp: 98.6 F (37 C)  SpO2: 99%  Weight: 133 lb (60.3 kg)  Height: 5\' 3"  (  1.6 m)     Physical Exam  Constitutional: She is oriented to person, place, and time. She appears well-developed and well-nourished.  HENT:  Head: Normocephalic and atraumatic.  Right Ear: External ear normal.  Left Ear: External ear normal.  Nose: Nose normal.  Eyes: Conjunctivae are normal. No scleral icterus.  Neck: No JVD present. No thyromegaly present.  Cardiovascular: Normal rate, regular rhythm and normal heart sounds.  Pulmonary/Chest: Effort normal and breath sounds normal.  Abdominal: Soft.  Musculoskeletal: She exhibits edema.  Trace edema.  Lymphadenopathy:    She has no cervical adenopathy.  Neurological: She is oriented to person, place, and time.  Skin: Skin is warm and dry.  Hyper pigmented, hyperkeratotic skin around both lateral ankles.  Psychiatric: She has a normal mood and affect. Her behavior is normal. Judgment and thought content normal.        Assessment & Plan:     1. Type 2 diabetes mellitus with diabetic nephropathy, without long-term current use of insulin (HCC)  - POCT glycosylated hemoglobin (Hb A1C)  2. Essential (primary) hypertension Stop ACEI due to cough. - Renal function panel  3. Weight loss 8 lbs since last OV. Possible dry weight? Will follow.  4. Anemia, unspecified type Anemia of chronic disease. - CBC with Differential/Platelet  5. Dry  cough Stop quinapril and f/u next month. 6.CKD I am very concerned about this issue as last GFR was 14 I think.Sees Dr Rolly Salter.     I have done the exam and reviewed the above chart and it is accurate to the best of my knowledge. Development worker, community has been used in this note in any air is in the dictation or transcription are unintentional.  [name], MD  [place]

## 2017-11-28 LAB — RENAL FUNCTION PANEL
Albumin: 4.4 g/dL (ref 3.5–4.7)
BUN / CREAT RATIO: 15 (ref 12–28)
BUN: 45 mg/dL — AB (ref 8–27)
CALCIUM: 10 mg/dL (ref 8.7–10.3)
CHLORIDE: 104 mmol/L (ref 96–106)
CO2: 24 mmol/L (ref 20–29)
Creatinine, Ser: 3.09 mg/dL — ABNORMAL HIGH (ref 0.57–1.00)
GFR calc Af Amer: 15 mL/min/{1.73_m2} — ABNORMAL LOW (ref >59)
GFR calc non Af Amer: 13 mL/min/{1.73_m2} — ABNORMAL LOW (ref >59)
GLUCOSE: 172 mg/dL — AB (ref 65–99)
POTASSIUM: 4.5 mmol/L (ref 3.5–5.2)
Phosphorus: 4 mg/dL (ref 2.5–4.5)
SODIUM: 143 mmol/L (ref 134–144)

## 2017-11-28 LAB — CBC WITH DIFFERENTIAL/PLATELET
BASOS ABS: 0 10*3/uL (ref 0.0–0.2)
Basos: 0 %
EOS (ABSOLUTE): 0.1 10*3/uL (ref 0.0–0.4)
EOS: 2 %
HEMATOCRIT: 32 % — AB (ref 34.0–46.6)
Hemoglobin: 10.1 g/dL — ABNORMAL LOW (ref 11.1–15.9)
IMMATURE GRANULOCYTES: 0 %
Immature Grans (Abs): 0 10*3/uL (ref 0.0–0.1)
LYMPHS ABS: 1.3 10*3/uL (ref 0.7–3.1)
Lymphs: 24 %
MCH: 26.8 pg (ref 26.6–33.0)
MCHC: 31.6 g/dL (ref 31.5–35.7)
MCV: 85 fL (ref 79–97)
Monocytes Absolute: 0.3 10*3/uL (ref 0.1–0.9)
Monocytes: 6 %
NEUTROS PCT: 68 %
Neutrophils Absolute: 3.8 10*3/uL (ref 1.4–7.0)
Platelets: 237 10*3/uL (ref 150–450)
RBC: 3.77 x10E6/uL (ref 3.77–5.28)
RDW: 14.4 % (ref 12.3–15.4)
WBC: 5.5 10*3/uL (ref 3.4–10.8)

## 2017-12-13 DIAGNOSIS — R0602 Shortness of breath: Secondary | ICD-10-CM | POA: Diagnosis not present

## 2017-12-31 ENCOUNTER — Other Ambulatory Visit: Payer: Self-pay | Admitting: Family Medicine

## 2018-01-09 ENCOUNTER — Other Ambulatory Visit: Payer: Self-pay | Admitting: Family Medicine

## 2018-01-11 DIAGNOSIS — B351 Tinea unguium: Secondary | ICD-10-CM | POA: Diagnosis not present

## 2018-01-11 DIAGNOSIS — E119 Type 2 diabetes mellitus without complications: Secondary | ICD-10-CM | POA: Diagnosis not present

## 2018-01-13 DIAGNOSIS — R0602 Shortness of breath: Secondary | ICD-10-CM | POA: Diagnosis not present

## 2018-01-17 ENCOUNTER — Telehealth: Payer: Self-pay | Admitting: Family Medicine

## 2018-01-17 MED ORDER — INSULIN PEN NEEDLE 31G X 5 MM MISC: 5 refills | Status: DC

## 2018-01-17 MED ORDER — CARVEDILOL 12.5 MG PO TABS: 12.5000 mg | ORAL_TABLET | Freq: Two times a day (BID) | ORAL | 3 refills | Status: DC

## 2018-01-17 NOTE — Telephone Encounter (Signed)
aspirin 81 MG EC tablet

## 2018-01-17 NOTE — Telephone Encounter (Signed)
aspirin 81 MG EC tablet carvedilol (COREG) 12.5 MG tablet  B-D UF III MINI PEN NEEDLES 31G X 5 MM MISC    [facility] [place]  Pt needing refills.  Thanks - Shirley

## 2018-01-28 ENCOUNTER — Other Ambulatory Visit: Payer: Self-pay | Admitting: Family Medicine

## 2018-02-12 DIAGNOSIS — R0602 Shortness of breath: Secondary | ICD-10-CM | POA: Diagnosis not present

## 2018-02-13 ENCOUNTER — Ambulatory Visit (INDEPENDENT_AMBULATORY_CARE_PROVIDER_SITE_OTHER): Payer: Medicare Other | Admitting: Family Medicine

## 2018-02-13 VITALS — BP 132/62 | HR 70 | Temp 98.4°F | Resp 16 | Wt 130.0 lb

## 2018-02-13 DIAGNOSIS — I5032 Chronic diastolic (congestive) heart failure: Secondary | ICD-10-CM | POA: Diagnosis not present

## 2018-02-13 DIAGNOSIS — F329 Major depressive disorder, single episode, unspecified: Secondary | ICD-10-CM

## 2018-02-13 DIAGNOSIS — I120 Hypertensive chronic kidney disease with stage 5 chronic kidney disease or end stage renal disease: Secondary | ICD-10-CM | POA: Diagnosis not present

## 2018-02-13 DIAGNOSIS — F419 Anxiety disorder, unspecified: Secondary | ICD-10-CM

## 2018-02-13 DIAGNOSIS — E1121 Type 2 diabetes mellitus with diabetic nephropathy: Secondary | ICD-10-CM | POA: Diagnosis not present

## 2018-02-13 DIAGNOSIS — Z23 Encounter for immunization: Secondary | ICD-10-CM | POA: Diagnosis not present

## 2018-02-13 DIAGNOSIS — N185 Chronic kidney disease, stage 5: Secondary | ICD-10-CM

## 2018-02-13 NOTE — Progress Notes (Signed)
Patient: Lori Stanley Female    DOB: 15-Nov-1931   84 y.o.   MRN: [id number] Visit Date: 02/13/2018  Today's Provider: [name], MD   Chief Complaint  Patient presents with  . Diabetes   Subjective:    HPI  Diabetes Mellitus Type II, Follow-up:   Lab Results  Component Value Date   HGBA1C 7.6 (A) 11/27/2017   HGBA1C 8.5 07/05/2017   HGBA1C 7.8 (H) 12/26/2016    Last seen for diabetes 4 months ago.  Management since then includes no changes. She reports good compliance with treatment. She is not having side effects.  Current symptoms include none and have been stable. Home blood sugar records: fasting range: 130s  Episodes of hypoglycemia? no   Most Recent Eye Exam: up to date Weight trend: stable Prior visit with dietician: no Current diet: well balanced Current exercise: no regular exercise  Pertinent Labs:    Component Value Date/Time   CHOL 138 12/26/2016 1239   CHOL 137 05/13/2013 0344   TRIG 149 12/26/2016 1239   TRIG 104 05/13/2013 0344   HDL 44 12/26/2016 1239   HDL 43 05/13/2013 0344   LDLCALC 64 12/26/2016 1239   LDLCALC 73 05/13/2013 0344   CREATININE 3.09 (H) 11/27/2017 1210   CREATININE 2.27 (H) 05/15/2013 0512    Wt Readings from Last 3 Encounters:  02/13/18 130 lb (59 kg)  11/27/17 133 lb (60.3 kg)  08/28/17 141 lb 12.8 oz (64.3 kg)      Allergies  Allergen Reactions  . Antihistamines, Chlorpheniramine-Type   . Codeine   . Paxil [Paroxetine]     "makes her feel funny" not in a good way     Current Outpatient Medications:  .  acetaminophen (TYLENOL) 500 MG tablet, Take 1,000 mg by mouth every 6 (six) hours as needed., Disp: , Rfl:  .  albuterol (PROVENTIL HFA;VENTOLIN HFA) 108 (90 Base) MCG/ACT inhaler, Inhale 2 puffs into the lungs every 6 (six) hours as needed for wheezing or shortness of breath., Disp: 1 Inhaler, Rfl: 0 .  amLODipine (NORVASC) 5 MG tablet, TAKE 1 TABLET BY MOUTH EVERY DAY, Disp: 90 tablet,  Rfl: 3 .  aspirin 81 MG EC tablet, TAKE 1 TABLET (81 MG TOTAL) BY MOUTH DAILY., Disp: 90 tablet, Rfl: 3 .  Bepotastine Besilate (BEPREVE) 1.5 % SOLN, Place 1 drop into both eyes daily., Disp: , Rfl:  .  carvedilol (COREG) 12.5 MG tablet, Take 1 tablet (12.5 mg total) by mouth 2 (two) times daily., Disp: 180 tablet, Rfl: 3 .  clonazePAM (KLONOPIN) 0.5 MG tablet, TAKE 1 TABLET BY MOUTH TWICE A DAY AS NEEDED, Disp: 60 tablet, Rfl: 4 .  dorzolamide (TRUSOPT) 2 % ophthalmic solution, Place 1 drop into both eyes 3 (three) times daily., Disp: 10 mL, Rfl: 0 .  furosemide (LASIX) 40 MG tablet, TAKE 1 TABLET BY MOUTH TWICE A DAY, Disp: 180 tablet, Rfl: 1 .  Insulin Lispro Prot & Lispro (HUMALOG MIX 75/25 KWIKPEN) (75-25) 100 UNIT/ML Kwikpen, Inject 6 Units into the skin 2 (two) times daily., Disp: 16 pen, Rfl: 12 .  Insulin Pen Needle (B-D UF III MINI PEN NEEDLES) 31G X 5 MM MISC, USE TWICE A DAY AS DIRECTED, Disp: 100 each, Rfl: 5 .  latanoprost (XALATAN) 0.005 % ophthalmic solution, Place 1 drop into the left eye at bedtime. , Disp: , Rfl:  .  loratadine (CLARITIN) 10 MG tablet, Take 1 tablet (10 mg total) by mouth  daily., Disp: 90 tablet, Rfl: 3 .  quinapril (ACCUPRIL) 40 MG tablet, TAKE 1 TABLET BY MOUTH EVERY DAY, Disp: 90 tablet, Rfl: 3 .  ranitidine (ZANTAC) 150 MG capsule, Take 1 capsule (150 mg total) by mouth 2 (two) times daily., Disp: 30 capsule, Rfl: 12 .  ranitidine (ZANTAC) 150 MG tablet, TAKE 1 TABLET (150 MG TOTAL) BY MOUTH 2 (TWO) TIMES DAILY., Disp: 60 tablet, Rfl: 11 .  simvastatin (ZOCOR) 40 MG tablet, TAKE 1 TABLET (40 MG TOTAL) BY MOUTH DAILY., Disp: 90 tablet, Rfl: 4 .  traZODone (DESYREL) 150 MG tablet, Take 1 tablet (150 mg total) by mouth at bedtime., Disp: 90 tablet, Rfl: 3 .  Vitamin D, Ergocalciferol, (DRISDOL) 50000 units CAPS capsule, Take 50,000 Units by mouth every 7 (seven) days., Disp: , Rfl:  .  azithromycin (ZITHROMAX) 250 MG tablet, Take 1 tablet (250 mg total) by mouth  daily. (Patient not taking: Reported on 08/28/2017), Disp: 2 tablet, Rfl: 0 .  cefUROXime (CEFTIN) 250 MG tablet, Take 1 tablet (250 mg total) by mouth 2 (two) times daily with a meal. (Patient not taking: Reported on 08/28/2017), Disp: 8 tablet, Rfl: 0  Review of Systems  Constitutional: Negative.   HENT: Negative.   Eyes: Negative.   Respiratory: Negative for cough and shortness of breath.   Cardiovascular: Negative for leg swelling.  Gastrointestinal: Negative.   Endocrine: Negative.   Musculoskeletal: Negative.   Allergic/Immunologic: Negative.   Neurological: Negative for dizziness.  Psychiatric/Behavioral: Negative.     Social History   Tobacco Use  . Smoking status: Former Smoker    Types: Cigarettes  . Smokeless tobacco: Never Used  Substance Use Topics  . Alcohol use: No   Objective:   BP 132/62   Pulse 70   Temp 98.4 F (36.9 C)   Resp 16   Wt 130 lb (59 kg)   BMI 23.03 kg/m  Vitals:   02/13/18 1132  BP: 132/62  Pulse: 70  Resp: 16  Temp: 98.4 F (36.9 C)  Weight: 130 lb (59 kg)     Physical Exam  Constitutional: She is oriented to person, place, and time. She appears well-developed and well-nourished.  HENT:  Head: Normocephalic and atraumatic.  Right Ear: External ear normal.  Left Ear: External ear normal.  Nose: Nose normal.  Eyes: Conjunctivae are normal. No scleral icterus.  Neck: No thyromegaly present.  Cardiovascular: Normal rate, regular rhythm and normal heart sounds.  Pulmonary/Chest: Effort normal and breath sounds normal.  Abdominal: Soft.  Musculoskeletal: She exhibits edema.  Trace edema.  Neurological: She is alert and oriented to person, place, and time.  Skin: Skin is warm and dry.  Psychiatric: She has a normal mood and affect. Her behavior is normal. Judgment and thought content normal.        Assessment & Plan:      1. Type 2 diabetes mellitus with diabetic nephropathy, without long-term current use of insulin  (HCC) A1C is 7.7 today  2. Hypertensive kidney disease with CKD (chronic kidney disease) stage V (HCC) GFR now less than 20--she is now willing to see nephrology. - CBC with Differential/Platelet - Renal function panel  3. Need for influenza vaccination  - Flu vaccine HIGH DOSE PF (Fluzone High dose)  4. Chronic diastolic heart failure ([place])   5. Reactive depression Since her son died at 71 many years ago.  6. Anxiety       I have done the exam and reviewed the above chart and  it is accurate to the best of my knowledge. Development worker, community has been used in this note in any air is in the dictation or transcription are unintentional.  [name], MD  [place]

## 2018-02-14 LAB — CBC WITH DIFFERENTIAL/PLATELET
BASOS: 0 %
Basophils Absolute: 0 10*3/uL (ref 0.0–0.2)
EOS (ABSOLUTE): 0.1 10*3/uL (ref 0.0–0.4)
EOS: 2 %
HEMATOCRIT: 32.4 % — AB (ref 34.0–46.6)
Hemoglobin: 10.3 g/dL — ABNORMAL LOW (ref 11.1–15.9)
IMMATURE GRANS (ABS): 0 10*3/uL (ref 0.0–0.1)
IMMATURE GRANULOCYTES: 0 %
LYMPHS: 15 %
Lymphocytes Absolute: 1 10*3/uL (ref 0.7–3.1)
MCH: 26.6 pg (ref 26.6–33.0)
MCHC: 31.8 g/dL (ref 31.5–35.7)
MCV: 84 fL (ref 79–97)
Monocytes Absolute: 0.4 10*3/uL (ref 0.1–0.9)
Monocytes: 5 %
NEUTROS PCT: 78 %
Neutrophils Absolute: 5.1 10*3/uL (ref 1.4–7.0)
Platelets: 269 10*3/uL (ref 150–450)
RBC: 3.87 x10E6/uL (ref 3.77–5.28)
RDW: 12.9 % (ref 12.3–15.4)
WBC: 6.6 10*3/uL (ref 3.4–10.8)

## 2018-02-14 LAB — RENAL FUNCTION PANEL
Albumin: 4.2 g/dL (ref 3.5–4.7)
BUN/Creatinine Ratio: 14 (ref 12–28)
BUN: 39 mg/dL — AB (ref 8–27)
CALCIUM: 10.6 mg/dL — AB (ref 8.7–10.3)
CO2: 23 mmol/L (ref 20–29)
CREATININE: 2.85 mg/dL — AB (ref 0.57–1.00)
Chloride: 98 mmol/L (ref 96–106)
GFR calc Af Amer: 17 mL/min/{1.73_m2} — ABNORMAL LOW (ref >59)
GFR calc non Af Amer: 15 mL/min/{1.73_m2} — ABNORMAL LOW (ref >59)
Glucose: 208 mg/dL — ABNORMAL HIGH (ref 65–99)
Phosphorus: 4.5 mg/dL (ref 2.5–4.5)
Potassium: 4.6 mmol/L (ref 3.5–5.2)
SODIUM: 139 mmol/L (ref 134–144)

## 2018-02-15 ENCOUNTER — Telehealth: Payer: Self-pay

## 2018-02-15 NOTE — Telephone Encounter (Signed)
Tried calling, no answer. 02/15/2018   -Mickel Baas

## 2018-02-15 NOTE — Telephone Encounter (Signed)
-----   Message from Jerrol Banana., MD sent at 02/15/2018  8:46 AM EDT ----- Stable but keep appt with nephrology.

## 2018-03-15 DIAGNOSIS — R0602 Shortness of breath: Secondary | ICD-10-CM | POA: Diagnosis not present

## 2018-04-06 ENCOUNTER — Encounter (INDEPENDENT_AMBULATORY_CARE_PROVIDER_SITE_OTHER): Payer: Medicare Other | Admitting: Ophthalmology

## 2018-04-11 ENCOUNTER — Other Ambulatory Visit: Payer: Self-pay | Admitting: Family Medicine

## 2018-04-12 DIAGNOSIS — E119 Type 2 diabetes mellitus without complications: Secondary | ICD-10-CM | POA: Diagnosis not present

## 2018-04-12 DIAGNOSIS — B351 Tinea unguium: Secondary | ICD-10-CM | POA: Diagnosis not present

## 2018-04-14 DIAGNOSIS — R0602 Shortness of breath: Secondary | ICD-10-CM | POA: Diagnosis not present

## 2018-04-18 ENCOUNTER — Telehealth: Payer: Self-pay | Admitting: Family Medicine

## 2018-04-18 NOTE — Telephone Encounter (Signed)
I called the patient to schedule AWV with McKenzie.  There was no answer and no option to leave a message. VDM (DD)

## 2018-04-23 DIAGNOSIS — E1122 Type 2 diabetes mellitus with diabetic chronic kidney disease: Secondary | ICD-10-CM | POA: Diagnosis not present

## 2018-04-23 DIAGNOSIS — N184 Chronic kidney disease, stage 4 (severe): Secondary | ICD-10-CM | POA: Diagnosis not present

## 2018-04-23 DIAGNOSIS — I129 Hypertensive chronic kidney disease with stage 1 through stage 4 chronic kidney disease, or unspecified chronic kidney disease: Secondary | ICD-10-CM | POA: Diagnosis not present

## 2018-04-23 DIAGNOSIS — R809 Proteinuria, unspecified: Secondary | ICD-10-CM | POA: Diagnosis not present

## 2018-04-23 DIAGNOSIS — R319 Hematuria, unspecified: Secondary | ICD-10-CM | POA: Diagnosis not present

## 2018-04-24 MED ORDER — DM-GUAIFENESIN ER 30-600 MG PO TB12: 1.0000 | ORAL_TABLET | Freq: Two times a day (BID) | ORAL | 1 refills | Status: DC

## 2018-04-24 NOTE — Telephone Encounter (Signed)
I spoke with the patient and scheduled her AWV-S with McKenzie.  She mentioned that she needs cold medicine and would [facility].  I told her that I would send the message to the nurse. VDM (DD)

## 2018-04-24 NOTE — Telephone Encounter (Signed)
Done

## 2018-04-29 ENCOUNTER — Other Ambulatory Visit: Payer: Self-pay | Admitting: Family Medicine

## 2018-05-03 ENCOUNTER — Encounter (INDEPENDENT_AMBULATORY_CARE_PROVIDER_SITE_OTHER): Payer: Medicare Other | Admitting: Ophthalmology

## 2018-05-08 ENCOUNTER — Ambulatory Visit: Payer: Medicare Other

## 2018-05-15 ENCOUNTER — Ambulatory Visit (INDEPENDENT_AMBULATORY_CARE_PROVIDER_SITE_OTHER): Payer: Medicare Other

## 2018-05-15 VITALS — BP 142/54 | HR 75 | Temp 98.8°F | Ht 63.0 in | Wt 125.4 lb

## 2018-05-15 DIAGNOSIS — Z0000 Encounter for general adult medical examination without abnormal findings: Secondary | ICD-10-CM | POA: Diagnosis not present

## 2018-05-15 DIAGNOSIS — R0602 Shortness of breath: Secondary | ICD-10-CM | POA: Diagnosis not present

## 2018-05-15 NOTE — Progress Notes (Signed)
Subjective:   Lori Stanley is a 85 y.o. female who presents for Medicare Annual (Subsequent) preventive examination.  Review of Systems:  N/A  Cardiac Risk Factors include: advanced age (>56men, >65 women);diabetes mellitus;dyslipidemia;hypertension     Objective:     Vitals: BP (!) 142/54 (BP Location: Right Arm)   Pulse 75   Temp 98.8 F (37.1 C) (Oral)   Ht 5\' 3"  (1.6 m)   Wt 125 lb 6.4 oz (56.9 kg)   BMI 22.21 kg/m   Body mass index is 22.21 kg/m.  Advanced Directives 05/15/2018 04/10/2017  Does Patient Have a Medical Advance Directive? Yes No  Type of Paramedic of [place];Living will -  Copy of [place] in Chart? No - copy requested -  Would patient like information on creating a medical advance directive? - No - Patient declined    Tobacco Social History   Tobacco Use  Smoking Status Former Smoker  . Types: Cigarettes  Smokeless Tobacco Never Used  Tobacco Comment   Quit in 2015-2016     Counseling given: Not Answered Comment: Quit in 2015-2016   Clinical Intake:  Pre-visit preparation completed: Yes  Pain : No/denies pain Pain Score: 0-No pain    Diabetes:  Is the patient diabetic?  Yes type 2 If diabetic, was a CBG obtained today?  No  Did the patient bring in their glucometer from home?  No  How often do you monitor your CBG's? Once daily.   Financial Strains and Diabetes Management:  Are you having any financial strains with the device, your supplies or your medication? No .  Does the patient want to be seen by Chronic Care Management for management of their diabetes?  No  Would the patient like to be referred to a Nutritionist or for Diabetic Management?  No   Diabetic Exams:  Diabetic Eye Exam: Completed 10/03/17.   Diabetic Foot Exam: Completed 04/18/17. Pt has been advised about the importance in completing this exam. Note made to f/u on this at next OV.    Nutritional Status: BMI  of 19-24  Normal Nutritional Risks: None   How often do you need to have someone help you when you read instructions, pamphlets, or other written materials from your doctor or pharmacy?: 1 - Never  Interpreter Needed?: No  Information entered by :: [facility], LPN  Past Medical History:  Diagnosis Date  . Acute heart failure ([place])   . Chronic low back pain   . Diabetes mellitus without complication ([place])   . Hyperlipidemia   . Hypertension   . MI (myocardial infarction) ([place])   . Obesity    Past Surgical History:  Procedure Laterality Date  . EYE SURGERY    . KNEE SURGERY    . THYROID SURGERY    . VAGINAL HYSTERECTOMY     Family History  Problem Relation Age of Onset  . Heart attack Mother    Social History   Socioeconomic History  . Marital status: Widowed    Spouse name: Not on file  . Number of children: 1  . Years of education: Not on file  . Highest education level: 11th grade  Occupational History  . Occupation: retired  Scientific laboratory technician  . Financial resource strain: Somewhat hard  . Food insecurity:    Worry: Never true    Inability: Never true  . Transportation needs:    Medical: No    Non-medical: No  Tobacco Use  . Smoking  status: Former Smoker    Types: Cigarettes  . Smokeless tobacco: Never Used  . Tobacco comment: Quit in 2015-2016  Substance and Sexual Activity  . Alcohol use: No  . Drug use: No  . Sexual activity: Never  Lifestyle  . Physical activity:    Days per week: 0 days    Minutes per session: 0 min  . Stress: Only a little  Relationships  . Social connections:    Talks on phone: Patient refused    Gets together: Patient refused    Attends religious service: Patient refused    Active member of club or organization: Patient refused    Attends meetings of clubs or organizations: Patient refused    Relationship status: Patient refused  Other Topics Concern  . Not on file  Social History Narrative  . Not on file    Outpatient  Encounter Medications as of 05/15/2018  Medication Sig  . acetaminophen (TYLENOL) 500 MG tablet Take 1,000 mg by mouth every 6 (six) hours as needed.  [name] albuterol (PROVENTIL HFA;VENTOLIN HFA) 108 (90 Base) MCG/ACT inhaler Inhale 2 puffs into the lungs every 6 (six) hours as needed for wheezing or shortness of breath.  [name] amLODipine (NORVASC) 5 MG tablet TAKE 1 TABLET BY MOUTH EVERY DAY  . aspirin 81 MG EC tablet TAKE 1 TABLET (81 MG TOTAL) BY MOUTH DAILY.  [name] Bepotastine Besilate (BEPREVE) 1.5 % SOLN Place 1 drop into both eyes daily.  . carvedilol (COREG) 12.5 MG tablet Take 1 tablet (12.5 mg total) by mouth 2 (two) times daily.  . clonazePAM (KLONOPIN) 0.5 MG tablet TAKE 1 TABLET BY MOUTH TWICE A DAY AS NEEDED  . dorzolamide (TRUSOPT) 2 % ophthalmic solution Place 1 drop into both eyes 3 (three) times daily.  . furosemide (LASIX) 40 MG tablet TAKE 1 TABLET BY MOUTH TWICE A DAY  . Insulin Lispro Prot & Lispro (HUMALOG MIX 75/25 KWIKPEN) (75-25) 100 UNIT/ML Kwikpen Inject 6 Units into the skin 2 (two) times daily.  . Insulin Pen Needle (B-D UF III MINI PEN NEEDLES) 31G X 5 MM MISC USE TWICE A DAY AS DIRECTED  . loratadine (CLARITIN) 10 MG tablet Take 1 tablet (10 mg total) by mouth daily.  . quinapril (ACCUPRIL) 40 MG tablet TAKE 1 TABLET BY MOUTH EVERY DAY  . ranitidine (ZANTAC) 150 MG capsule Take 1 capsule (150 mg total) by mouth 2 (two) times daily.  . simvastatin (ZOCOR) 40 MG tablet TAKE 1 TABLET (40 MG TOTAL) BY MOUTH DAILY.  . traZODone (DESYREL) 150 MG tablet TAKE 1 TABLET (150 MG TOTAL) BY MOUTH AT BEDTIME.  [name] Vitamin D, Ergocalciferol, (DRISDOL) 50000 units CAPS capsule Take 50,000 Units by mouth every 7 (seven) days.  [name] azithromycin (ZITHROMAX) 250 MG tablet Take 1 tablet (250 mg total) by mouth daily. (Patient not taking: Reported on 08/28/2017)  . cefUROXime (CEFTIN) 250 MG tablet Take 1 tablet (250 mg total) by mouth 2 (two) times daily with a meal. (Patient not taking: Reported on  08/28/2017)  . dextromethorphan-guaiFENesin (MUCINEX DM) 30-600 MG 12hr tablet Take 1 tablet by mouth 2 (two) times daily. (Patient not taking: Reported on 05/15/2018)  . latanoprost (XALATAN) 0.005 % ophthalmic solution Place 1 drop into the left eye at bedtime.   . ranitidine (ZANTAC) 150 MG tablet TAKE 1 TABLET (150 MG TOTAL) BY MOUTH 2 (TWO) TIMES DAILY. (Patient not taking: Reported on 05/15/2018)   No facility-administered encounter medications on file as of 05/15/2018.  Activities of Daily Living In your present state of health, do you have any difficulty performing the following activities: 05/15/2018  Hearing? N  Vision? N  Comment Wears eye glasses.  Difficulty concentrating or making decisions? Y  Walking or climbing stairs? Y  Comment Avoids steps completely.   Dressing or bathing? N  Doing errands, shopping? Y  Comment Does not drive.   Preparing Food and eating ? N  Using the Toilet? N  In the past six months, have you accidently leaked urine? N  Do you have problems with loss of bowel control? N  Managing your Medications? N  Managing your Finances? N  Housekeeping or managing your Housekeeping? N  Some recent data might be hidden    Patient Care Team: Jerrol Banana., MD as PCP - General (Family Medicine) Sharlotte Alamo, DPM (Podiatry) Hayden Pedro, MD as Consulting Physician (Ophthalmology) Lavonia Dana, MD as Consulting Physician (Internal Medicine) Odette Fraction [facility])    Assessment:   This is a routine wellness examination for [place].  Exercise Activities and Dietary recommendations Current Exercise Habits: Home exercise routine, Type of exercise: stretching;walking, Time (Minutes): 20, Frequency (Times/Week): 5, Weekly Exercise (Minutes/Week): 100, Intensity: Mild, Exercise limited by: None identified  Goals    . DIET - INCREASE WATER INTAKE     Recommend to drink at least 6-8 8oz glasses of water per day.       Fall Risk Fall  Risk  05/15/2018 08/28/2017 12/26/2016 10/19/2016 08/22/2016  Falls in the past year? 0 No Yes Yes No  Number falls in past yr: - - 2 or more 1 -  Injury with Fall? - - No Yes -  Risk for fall due to : - - History of fall(s);Impaired balance/gait Impaired balance/gait -  Follow up - - - Falls evaluation completed;Falls prevention discussed;Follow up appointment -   FALL RISK PREVENTION PERTAINING TO THE HOME:  Any stairs in or around the home WITH handrails? No  Home free of loose throw rugs in walkways, pet beds, electrical cords, etc? Yes  Adequate lighting in your home to reduce risk of falls? Yes   ASSISTIVE DEVICES UTILIZED TO PREVENT FALLS:  Life alert? No  Use of a cane, walker or w/c? Yes  Grab bars in the bathroom? No  Shower chair or bench in shower? Yes  Elevated toilet seat or a handicapped toilet? Yes    TIMED UP AND GO:  Was the test performed? No .     Depression Screen PHQ 2/9 Scores 05/15/2018 08/28/2017 08/28/2017 08/22/2016  PHQ - 2 Score 1 0 0 0  PHQ- 9 Score - 2 - -     Cognitive Function     6CIT Screen 05/15/2018  What Year? 0 points  What month? 0 points  What time? 0 points  Count back from 20 0 points  Months in reverse 2 points  Repeat phrase 4 points  Total Score 6    Immunization History  Administered Date(s) Administered  . Influenza, High Dose Seasonal PF 02/05/2015, 01/16/2016, 12/26/2016, 02/13/2018  . Pneumococcal Conjugate-13 02/04/2014  . Pneumococcal Polysaccharide-23 04/07/2002  . Td 10/19/2016    Qualifies for Shingles Vaccine? Yes . Due for Shingrix. Education has been provided regarding the importance of this vaccine. Pt has been advised to call insurance company to determine out of pocket expense. Advised may also receive vaccine at local pharmacy or Health Dept. Verbalized acceptance and understanding.  Tdap: Up to date  Flu Vaccine: Up  to date  Pneumococcal Vaccine: Up to date   Screening Tests Health Maintenance    Topic Date Due  . FOOT EXAM  04/18/2018  . HEMOGLOBIN A1C  05/30/2018  . OPHTHALMOLOGY EXAM  10/04/2018  . TETANUS/TDAP  10/20/2026  . INFLUENZA VACCINE  Completed  . DEXA SCAN  Completed  . PNA vac Low Risk Adult  Completed    Cancer Screenings:  Colorectal Screening: No longer required.   Mammogram: No longer required.   Bone Density: Completed 12/11/03. Results reflect NORMAL. Repeat every 10 years. Ordered 11/15/11 but not completed. Pt declined referral today.   Lung Cancer Screening: (Low Dose CT Chest recommended if Age 69-80 years, 30 pack-year currently smoking OR have quit w/in 15years.) does not qualify.    Additional Screening:  Vision Screening: Recommended annual ophthalmology exams for early detection of glaucoma and other disorders of the eye.  Dental Screening: Recommended annual dental exams for proper oral hygiene  Community Resource Referral:  CRR required this visit?  No       Plan:  I have personally reviewed and addressed the Medicare Annual Wellness questionnaire and have noted the following in the patient's chart:  A. Medical and social history B. Use of alcohol, tobacco or illicit drugs  C. Current medications and supplements D. Functional ability and status E.  Nutritional status F.  Physical activity G. Advance directives H. List of other physicians I.  Hospitalizations, surgeries, and ER visits in previous 12 months J.  [place] such as hearing and vision if needed, cognitive and depression L. Referrals and appointments - none  In addition, I have reviewed and discussed with patient certain preventive protocols, quality metrics, and best practice recommendations. A written personalized care plan for preventive services as well as general preventive health recommendations were provided to patient.  See attached scanned questionnaire for additional information.   Signed,  Fabio Neighbors, LPN Nurse Health  Advisor   Nurse Recommendations: Pt needs a diabetic foot exam at next OV. Pt declined the DEXA referral today.

## 2018-05-15 NOTE — Patient Instructions (Signed)
[name] , Thank you for taking time to come for your Medicare Wellness Visit. I appreciate your ongoing commitment to your health goals. Please review the following plan we discussed and let me know if I can assist you in the future.   Screening recommendations/referrals: Colonoscopy: No longer required.  Mammogram: No longer required.  Bone Density: Pt declines order today.  Recommended yearly ophthalmology/optometry visit for glaucoma screening and checkup Recommended yearly dental visit for hygiene and checkup  Vaccinations: Influenza vaccine: Up to date Pneumococcal vaccine: Completed series Tdap vaccine: Up to date, due 06/028 Shingles vaccine: Pt declines today.     Advanced directives: Please bring a copy of your POA (Power of Attorney) and/or Living Will to your next appointment.   Conditions/risks identified: Recommend to drink at least 6-8 8oz glasses of water per day.  Next appointment: 06/27/18 @ 10:20 AM with Dr Lori Stanley.    Preventive Care 83 Years and Older, Female Preventive care refers to lifestyle choices and visits with your health care provider that can promote health and wellness. What does preventive care include?  A yearly physical exam. This is also called an annual well check.  Dental exams once or twice a year.  Routine eye exams. Ask your health care provider how often you should have your eyes checked.  Personal lifestyle choices, including:  Daily care of your teeth and gums.  Regular physical activity.  Eating a healthy diet.  Avoiding tobacco and drug use.  Limiting alcohol use.  Practicing safe sex.  Taking low-dose aspirin every day.  Taking vitamin and mineral supplements as recommended by your health care provider. What happens during an annual well check? The services and screenings done by your health care provider during your annual well check will depend on your age, overall health, lifestyle risk factors, and family  history of disease. Counseling  Your health care provider may ask you questions about your:  Alcohol use.  Tobacco use.  Drug use.  Emotional well-being.  Home and relationship well-being.  Sexual activity.  Eating habits.  History of falls.  Memory and ability to understand (cognition).  Work and work Statistician.  Reproductive health. Screening  You may have the following tests or measurements:  Height, weight, and BMI.  Blood pressure.  Lipid and cholesterol levels. These may be checked every 5 years, or more frequently if you are over 21 years old.  Skin check.  Lung cancer screening. You may have this screening every year starting at age 13 if you have a 30-pack-year history of smoking and currently smoke or have quit within the past 15 years.  Fecal occult blood test (FOBT) of the stool. You may have this test every year starting at age 30.  Flexible sigmoidoscopy or colonoscopy. You may have a sigmoidoscopy every 5 years or a colonoscopy every 10 years starting at age 19.  Hepatitis C blood test.  Hepatitis B blood test.  Sexually transmitted disease (STD) testing.  Diabetes screening. This is done by checking your blood sugar (glucose) after you have not eaten for a while (fasting). You may have this done every 1-3 years.  Bone density scan. This is done to screen for osteoporosis. You may have this done starting at age 67.  Mammogram. This may be done every 1-2 years. Talk to your health care provider about how often you should have regular mammograms. Talk with your health care provider about your test results, treatment options, and if necessary, the need for more tests.  Vaccines  Your health care provider may recommend certain vaccines, such as:  Influenza vaccine. This is recommended every year.  Tetanus, diphtheria, and acellular pertussis (Tdap, Td) vaccine. You may need a Td booster every 10 years.  Zoster vaccine. You may need this after  age 23.  Pneumococcal 13-valent conjugate (PCV13) vaccine. One dose is recommended after age 39.  Pneumococcal polysaccharide (PPSV23) vaccine. One dose is recommended after age 33. Talk to your health care provider about which screenings and vaccines you need and how often you need them. This information is not intended to replace advice given to you by your health care provider. Make sure you discuss any questions you have with your health care provider. Document Released: 05/15/2015 Document Revised: 01/06/2016 Document Reviewed: 02/17/2015 Elsevier Interactive Patient Education  2017 [place] Prevention in the Home Falls can cause injuries. They can happen to people of all ages. There are many things you can do to make your home safe and to help prevent falls. What can I do on the outside of my home?  Regularly fix the edges of walkways and driveways and fix any cracks.  Remove anything that might make you trip as you walk through a door, such as a raised step or threshold.  Trim any bushes or trees on the path to your home.  Use bright outdoor lighting.  Clear any walking paths of anything that might make someone trip, such as rocks or tools.  Regularly check to see if handrails are loose or broken. Make sure that both sides of any steps have handrails.  Any raised decks and porches should have guardrails on the edges.  Have any leaves, snow, or ice cleared regularly.  Use sand or salt on walking paths during winter.  Clean up any spills in your garage right away. This includes oil or grease spills. What can I do in the bathroom?  Use night lights.  Install grab bars by the toilet and in the tub and shower. Do not use towel bars as grab bars.  Use non-skid mats or decals in the tub or shower.  If you need to sit down in the shower, use a plastic, non-slip stool.  Keep the floor dry. Clean up any water that spills on the floor as soon as it  happens.  Remove soap buildup in the tub or shower regularly.  Attach bath mats securely with double-sided non-slip rug tape.  Do not have throw rugs and other things on the floor that can make you trip. What can I do in the bedroom?  Use night lights.  Make sure that you have a light by your bed that is easy to reach.  Do not use any sheets or blankets that are too big for your bed. They should not hang down onto the floor.  Have a firm chair that has side arms. You can use this for support while you get dressed.  Do not have throw rugs and other things on the floor that can make you trip. What can I do in the kitchen?  Clean up any spills right away.  Avoid walking on wet floors.  Keep items that you use a lot in easy-to-reach places.  If you need to reach something above you, use a strong step stool that has a grab bar.  Keep electrical cords out of the way.  Do not use floor polish or wax that makes floors slippery. If you must use wax, use non-skid floor wax.  Do not have throw rugs and other things on the floor that can make you trip. What can I do with my stairs?  Do not leave any items on the stairs.  Make sure that there are handrails on both sides of the stairs and use them. Fix handrails that are broken or loose. Make sure that handrails are as long as the stairways.  Check any carpeting to make sure that it is firmly attached to the stairs. Fix any carpet that is loose or worn.  Avoid having throw rugs at the top or bottom of the stairs. If you do have throw rugs, attach them to the floor with carpet tape.  Make sure that you have a light switch at the top of the stairs and the bottom of the stairs. If you do not have them, ask someone to add them for you. What else can I do to help prevent falls?  Wear shoes that:  Do not have high heels.  Have rubber bottoms.  Are comfortable and fit you well.  Are closed at the toe. Do not wear sandals.  If you  use a stepladder:  Make sure that it is fully opened. Do not climb a closed stepladder.  Make sure that both sides of the stepladder are locked into place.  Ask someone to hold it for you, if possible.  Clearly mark and make sure that you can see:  Any grab bars or handrails.  First and last steps.  Where the edge of each step is.  Use tools that help you move around (mobility aids) if they are needed. These include:  Canes.  Walkers.  Scooters.  Crutches.  Turn on the lights when you go into a dark area. Replace any light bulbs as soon as they burn out.  Set up your furniture so you have a clear path. Avoid moving your furniture around.  If any of your floors are uneven, fix them.  If there are any pets around you, be aware of where they are.  Review your medicines with your doctor. Some medicines can make you feel dizzy. This can increase your chance of falling. Ask your doctor what other things that you can do to help prevent falls. This information is not intended to replace advice given to you by your health care provider. Make sure you discuss any questions you have with your health care provider. Document Released: 02/12/2009 Document Revised: 09/24/2015 Document Reviewed: 05/23/2014 Elsevier Interactive Patient Education  2017 [employer].

## 2018-05-17 ENCOUNTER — Telehealth: Payer: Self-pay

## 2018-05-17 NOTE — Telephone Encounter (Signed)
Received a fax from [facility] saying that simvastatin and amlodipine is a category 2 interaction, and should be avoided. Per Dr. [name], patient is to discontinue Simvastatin. Called and advised the patient, and she verbally understands.

## 2018-06-01 ENCOUNTER — Encounter (INDEPENDENT_AMBULATORY_CARE_PROVIDER_SITE_OTHER): Payer: Medicare Other | Admitting: Ophthalmology

## 2018-06-01 DIAGNOSIS — E11319 Type 2 diabetes mellitus with unspecified diabetic retinopathy without macular edema: Secondary | ICD-10-CM | POA: Diagnosis not present

## 2018-06-01 DIAGNOSIS — E113593 Type 2 diabetes mellitus with proliferative diabetic retinopathy without macular edema, bilateral: Secondary | ICD-10-CM | POA: Diagnosis not present

## 2018-06-01 DIAGNOSIS — I10 Essential (primary) hypertension: Secondary | ICD-10-CM | POA: Diagnosis not present

## 2018-06-01 DIAGNOSIS — H35033 Hypertensive retinopathy, bilateral: Secondary | ICD-10-CM

## 2018-06-15 DIAGNOSIS — R0602 Shortness of breath: Secondary | ICD-10-CM | POA: Diagnosis not present

## 2018-06-25 ENCOUNTER — Telehealth: Payer: Self-pay | Admitting: Family Medicine

## 2018-06-25 NOTE — Telephone Encounter (Signed)
Lori Stanley with [facility] called asking if we will call the patient and let her know if she needs to be still taking the Humalog 75/25.  They do not have it listed as one of her current meds.  Pt's CB# [phone]  Thanks  Con Memos

## 2018-06-25 NOTE — Telephone Encounter (Signed)
No answer, will try again later.

## 2018-06-26 ENCOUNTER — Telehealth: Payer: Self-pay | Admitting: Family Medicine

## 2018-06-26 NOTE — Telephone Encounter (Signed)
Pt needing to know if she needs to continue taking the blood glucose medications.  She was told she doesn't without checking with Dr. [name].  Please advise asap.  Thanks, [employer]

## 2018-06-27 ENCOUNTER — Encounter: Payer: Medicare Other | Admitting: Family Medicine

## 2018-07-03 ENCOUNTER — Encounter: Payer: Self-pay | Admitting: Family Medicine

## 2018-07-03 ENCOUNTER — Ambulatory Visit (INDEPENDENT_AMBULATORY_CARE_PROVIDER_SITE_OTHER): Payer: Medicare Other | Admitting: Family Medicine

## 2018-07-03 VITALS — BP 124/54 | HR 64 | Temp 97.9°F | Resp 15 | Wt 129.0 lb

## 2018-07-03 DIAGNOSIS — Z794 Long term (current) use of insulin: Secondary | ICD-10-CM

## 2018-07-03 DIAGNOSIS — I5032 Chronic diastolic (congestive) heart failure: Secondary | ICD-10-CM | POA: Diagnosis not present

## 2018-07-03 DIAGNOSIS — E1121 Type 2 diabetes mellitus with diabetic nephropathy: Secondary | ICD-10-CM

## 2018-07-03 DIAGNOSIS — F329 Major depressive disorder, single episode, unspecified: Secondary | ICD-10-CM

## 2018-07-03 DIAGNOSIS — E118 Type 2 diabetes mellitus with unspecified complications: Secondary | ICD-10-CM | POA: Diagnosis not present

## 2018-07-03 DIAGNOSIS — R05 Cough: Secondary | ICD-10-CM

## 2018-07-03 DIAGNOSIS — E785 Hyperlipidemia, unspecified: Secondary | ICD-10-CM

## 2018-07-03 DIAGNOSIS — I10 Essential (primary) hypertension: Secondary | ICD-10-CM

## 2018-07-03 DIAGNOSIS — E1322 Other specified diabetes mellitus with diabetic chronic kidney disease: Secondary | ICD-10-CM

## 2018-07-03 DIAGNOSIS — T464X5A Adverse effect of angiotensin-converting-enzyme inhibitors, initial encounter: Secondary | ICD-10-CM

## 2018-07-03 DIAGNOSIS — I129 Hypertensive chronic kidney disease with stage 1 through stage 4 chronic kidney disease, or unspecified chronic kidney disease: Secondary | ICD-10-CM

## 2018-07-03 DIAGNOSIS — N184 Chronic kidney disease, stage 4 (severe): Secondary | ICD-10-CM

## 2018-07-03 LAB — POCT GLYCOSYLATED HEMOGLOBIN (HGB A1C): HEMOGLOBIN A1C: 6.5 % — AB (ref 4.0–5.6)

## 2018-07-03 MED ORDER — LOSARTAN POTASSIUM 50 MG PO TABS: 50.0000 mg | ORAL_TABLET | Freq: Every day | ORAL | 12 refills | Status: DC

## 2018-07-03 MED ORDER — INSULIN LISPRO PROT & LISPRO (75-25 MIX) 100 UNIT/ML KWIKPEN: 4.0000 [IU] | PEN_INJECTOR | Freq: Two times a day (BID) | SUBCUTANEOUS | 12 refills | Status: DC

## 2018-07-03 NOTE — Patient Instructions (Signed)
1. Stop Quinapril. 2. Begin taking Losartan 50mg  daily. Available for pick up at [place] 3. Decrease Humalog to 4 units 2 times daily

## 2018-07-03 NOTE — Progress Notes (Signed)
Patient: Lori Stanley Female    DOB: [birth year]   85 y.o.   MRN: [id number] Visit Date: 07/03/2018  Today's Provider: [name], MD   Chief Complaint  Patient presents with  . Diabetes  . Chronic Kidney Disease    Hypertensive chronic kidney disease stage V.    Subjective:     HPI   Diabetes Mellitus Type II, Follow-up:   Lab Results  Component Value Date   HGBA1C 7.6 (A) 11/27/2017   HGBA1C 8.5 07/05/2017   HGBA1C 7.8 (H) 12/26/2016    Last seen for diabetes 4 months ago.  Management since then includes none She reports excellent compliance with treatment. She is not having side effects.  Current symptoms include none and have been stable. Home blood sugar records: are not being checked regularly but states that it has been WNL  Episodes of hypoglycemia? no   Current Insulin Regimen: Humalog( inject 6units into skin BID) Most Recent Eye Exam: 05/01/2018 (patient reports) Weight trend: stable Prior visit with dietician: No Current exercise: walking Current diet habits: well balanced  Patient has had a very mild intermittent hacking cough for the past couple of months.  Shortness of breath, no orthopnea, no PND, no chest pain.  No weight gain or swelling. Pertinent Labs:    Component Value Date/Time   CHOL 138 12/26/2016 1239   CHOL 137 05/13/2013 0344   TRIG 149 12/26/2016 1239   TRIG 104 05/13/2013 0344   HDL 44 12/26/2016 1239   HDL 43 05/13/2013 0344   LDLCALC 64 12/26/2016 1239   LDLCALC 73 05/13/2013 0344   CREATININE 2.85 (H) 02/13/2018 1159   CREATININE 2.27 (H) 05/15/2013 0512    Wt Readings from Last 3 Encounters:  07/03/18 129 lb (58.5 kg)  05/15/18 125 lb 6.4 oz (56.9 kg)  02/13/18 130 lb (59 kg)    ------------------------------------------------------------------------   Allergies  Allergen Reactions  . Antihistamines, Chlorpheniramine-Type   . Codeine   . Paxil [Paroxetine]     "makes her feel funny" not in a  good way     Current Outpatient Medications:  .  acetaminophen (TYLENOL) 500 MG tablet, Take 1,000 mg by mouth every 6 (six) hours as needed., Disp: , Rfl:  .  albuterol (PROVENTIL HFA;VENTOLIN HFA) 108 (90 Base) MCG/ACT inhaler, Inhale 2 puffs into the lungs every 6 (six) hours as needed for wheezing or shortness of breath., Disp: 1 Inhaler, Rfl: 0 .  amLODipine (NORVASC) 5 MG tablet, TAKE 1 TABLET BY MOUTH EVERY DAY, Disp: 90 tablet, Rfl: 3 .  aspirin 81 MG EC tablet, TAKE 1 TABLET (81 MG TOTAL) BY MOUTH DAILY., Disp: 90 tablet, Rfl: 3 .  Bepotastine Besilate (BEPREVE) 1.5 % SOLN, Place 1 drop into both eyes daily., Disp: , Rfl:  .  carvedilol (COREG) 12.5 MG tablet, Take 1 tablet (12.5 mg total) by mouth 2 (two) times daily., Disp: 180 tablet, Rfl: 3 .  clonazePAM (KLONOPIN) 0.5 MG tablet, TAKE 1 TABLET BY MOUTH TWICE A DAY AS NEEDED, Disp: 60 tablet, Rfl: 4 .  dorzolamide (TRUSOPT) 2 % ophthalmic solution, Place 1 drop into both eyes 3 (three) times daily., Disp: 10 mL, Rfl: 0 .  furosemide (LASIX) 40 MG tablet, TAKE 1 TABLET BY MOUTH TWICE A DAY, Disp: 180 tablet, Rfl: 1 .  Insulin Lispro Prot & Lispro (HUMALOG MIX 75/25 KWIKPEN) (75-25) 100 UNIT/ML Kwikpen, Inject 6 Units into the skin 2 (two) times daily., Disp: 16 pen,  Rfl: 12 .  Insulin Pen Needle (B-D UF III MINI PEN NEEDLES) 31G X 5 MM MISC, USE TWICE A DAY AS DIRECTED, Disp: 100 each, Rfl: 5 .  latanoprost (XALATAN) 0.005 % ophthalmic solution, Place 1 drop into the left eye at bedtime. , Disp: , Rfl:  .  loratadine (CLARITIN) 10 MG tablet, Take 1 tablet (10 mg total) by mouth daily., Disp: 90 tablet, Rfl: 3 .  quinapril (ACCUPRIL) 40 MG tablet, TAKE 1 TABLET BY MOUTH EVERY DAY, Disp: 90 tablet, Rfl: 3 .  ranitidine (ZANTAC) 150 MG capsule, Take 1 capsule (150 mg total) by mouth 2 (two) times daily., Disp: 30 capsule, Rfl: 12 .  ranitidine (ZANTAC) 150 MG tablet, TAKE 1 TABLET (150 MG TOTAL) BY MOUTH 2 (TWO) TIMES DAILY., Disp: 60  tablet, Rfl: 11 .  traZODone (DESYREL) 150 MG tablet, TAKE 1 TABLET (150 MG TOTAL) BY MOUTH AT BEDTIME., Disp: 90 tablet, Rfl: 3 .  Vitamin D, Ergocalciferol, (DRISDOL) 50000 units CAPS capsule, Take 50,000 Units by mouth every 7 (seven) days., Disp: , Rfl:  .  cefUROXime (CEFTIN) 250 MG tablet, Take 1 tablet (250 mg total) by mouth 2 (two) times daily with a meal. (Patient not taking: Reported on 07/03/2018), Disp: 8 tablet, Rfl: 0 .  dextromethorphan-guaiFENesin (MUCINEX DM) 30-600 MG 12hr tablet, Take 1 tablet by mouth 2 (two) times daily. (Patient not taking: Reported on 07/03/2018), Disp: 30 tablet, Rfl: 1  Review of Systems  Constitutional: Negative.   HENT: Negative.   Eyes: Negative.   Respiratory: Negative for cough and shortness of breath.   Cardiovascular: Negative for leg swelling.  Gastrointestinal: Negative.   Endocrine: Negative.   Musculoskeletal: Negative.   Allergic/Immunologic: Negative.   Neurological: Negative for dizziness.  Psychiatric/Behavioral: Negative.     Social History   Tobacco Use  . Smoking status: Former Smoker    Types: Cigarettes  . Smokeless tobacco: Never Used  . Tobacco comment: Quit in 2015-2016  Substance Use Topics  . Alcohol use: No      Objective:   BP (!) 124/54   Pulse 64   Temp 97.9 F (36.6 C) (Oral)   Resp 15   Wt 129 lb (58.5 kg)   SpO2 96%   BMI 22.85 kg/m  Vitals:   07/03/18 1104  BP: (!) 124/54  Pulse: 64  Resp: 15  Temp: 97.9 F (36.6 C)  TempSrc: Oral  SpO2: 96%  Weight: 129 lb (58.5 kg)     Physical Exam Constitutional:      Appearance: She is well-developed.  HENT:     Head: Normocephalic and atraumatic.     Right Ear: External ear normal.     Left Ear: External ear normal.     Nose: Nose normal.  Eyes:     General: No scleral icterus.    Conjunctiva/sclera: Conjunctivae normal.  Neck:     Thyroid: No thyromegaly.  Cardiovascular:     Rate and Rhythm: Normal rate and regular rhythm.     Heart  sounds: Normal heart sounds.  Pulmonary:     Effort: Pulmonary effort is normal.     Breath sounds: Normal breath sounds.  Abdominal:     Palpations: Abdomen is soft.  Musculoskeletal:     Comments: Trace edema.  Skin:    General: Skin is warm and dry.  Neurological:     General: No focal deficit present.     Mental Status: She is alert and oriented to person, place, and time.  Psychiatric:        Behavior: Behavior normal.        Thought Content: Thought content normal.        Judgment: Judgment normal.         Assessment & Plan    1. Type 2 diabetes mellitus with complication, with long-term current use of insulin (HCC) A1c well controlled at 6.5.  This time cut her insulin back from 6 units to 4 units.  Hopefully can discontinue insulin in the future. - Insulin Lispro Prot & Lispro (HUMALOG MIX 75/25 KWIKPEN) (75-25) 100 UNIT/ML Kwikpen; Inject 4 Units into the skin 2 (two) times daily.  Dispense: 16 pen; Refill: 12  2. Type 2 diabetes mellitus with diabetic nephropathy, without long-term current use of insulin (HCC)  - POCT glycosylated hemoglobin (Hb A1C)  3. Essential hypertension, malignant Well-controlled.  4. Chronic diastolic heart failure (HCC) Only stable  5. Hyperlipidemia, unspecified hyperlipidemia type   6. Reactive depression Patient lost only son at age 68 many years ago. 7.cough This really sounds like a cough from an ACE inhibitor.  At this time stop quinapril and start losartan 50 mg daily.  Return to clinic 3 to 4 months.  I think she needs a ARB due to heart failure and CKD 8.DM with CKD Followed by nephrology.   I have done the exam and reviewed the above chart and it is accurate to the best of my knowledge. Development worker, community has been used in this note in any air is in the dictation or transcription are unintentional.  [name], MD  [place]

## 2018-07-06 ENCOUNTER — Telehealth: Payer: Self-pay | Admitting: Family Medicine

## 2018-07-06 NOTE — Telephone Encounter (Signed)
Pt calling about a new medication that was prescribed to her at the last visit.  Pt doesn't know the name of the medication or what it was for.  The medication could not be filled at the pharmacy because it wasn't in stock. Please call pt back to let her know what else can be done to get the medication.  Thanks, [employer]

## 2018-07-09 NOTE — Telephone Encounter (Signed)
Received a fax about Losartan and spoke to Dr. [name] about this on 07/05/2018. Per Dr. [name], patient is to take Losartan 25mg  BID due to the 50mg  and 100mg  not in stock. Medication was sent into the pharmacy. Patient was advised.

## 2018-07-09 NOTE — Telephone Encounter (Signed)
It appears it was Losartan.  Do you want to change it?

## 2018-07-10 MED ORDER — TELMISARTAN 20 MG PO TABS: 20.0000 mg | ORAL_TABLET | Freq: Every day | ORAL | 11 refills | Status: DC

## 2018-07-10 NOTE — Telephone Encounter (Signed)
Changed medication as below. Medication sent in. Patient was advised.

## 2018-07-10 NOTE — Addendum Note (Signed)
Addended by: Wilburt Finlay L on: 07/10/2018 01:30 PM   Modules accepted: Orders

## 2018-07-10 NOTE — Telephone Encounter (Signed)
About telmisartan 20 mg daily instead of the losartan

## 2018-07-12 DIAGNOSIS — E119 Type 2 diabetes mellitus without complications: Secondary | ICD-10-CM | POA: Diagnosis not present

## 2018-07-12 DIAGNOSIS — B351 Tinea unguium: Secondary | ICD-10-CM | POA: Diagnosis not present

## 2018-07-14 ENCOUNTER — Other Ambulatory Visit: Payer: Self-pay | Admitting: Family Medicine

## 2018-07-14 DIAGNOSIS — I509 Heart failure, unspecified: Secondary | ICD-10-CM | POA: Diagnosis not present

## 2018-07-14 DIAGNOSIS — J449 Chronic obstructive pulmonary disease, unspecified: Secondary | ICD-10-CM | POA: Diagnosis not present

## 2018-07-14 DIAGNOSIS — R062 Wheezing: Secondary | ICD-10-CM | POA: Diagnosis not present

## 2018-08-14 DIAGNOSIS — J449 Chronic obstructive pulmonary disease, unspecified: Secondary | ICD-10-CM | POA: Diagnosis not present

## 2018-08-14 DIAGNOSIS — R062 Wheezing: Secondary | ICD-10-CM | POA: Diagnosis not present

## 2018-08-14 DIAGNOSIS — I509 Heart failure, unspecified: Secondary | ICD-10-CM | POA: Diagnosis not present

## 2018-08-28 ENCOUNTER — Encounter: Payer: Medicare Other | Admitting: Family Medicine

## 2018-09-12 ENCOUNTER — Ambulatory Visit: Payer: Self-pay | Admitting: Pharmacist

## 2018-09-12 NOTE — Chronic Care Management (AMB) (Signed)
Chronic Care Management   Note  09/12/2018 Name: Lori Stanley MRN: [id number] DOB: Jun 03, 1931  Lori Stanley is a 85 y.o. year old female who is a primary care patient of Jerrol Banana., MD. I reached out to [name] by phone today in response to a referral sent by Ms. Margret Chance [place]'s health plan.    Ms. Fishburn was given information about Chronic Care Management services today including:  1. CCM service includes personalized support from designated clinical staff supervised by her physician, including individualized plan of care and coordination with other care providers 2. 24/7 contact phone numbers for assistance for urgent and routine care needs. 3. Service will only be billed when office clinical staff spend 20 minutes or more in a month to coordinate care. 4. Only one practitioner may furnish and bill the service in a calendar month. 5. The patient may stop CCM services at any time (effective at the end of the month) by phone call to the office staff. 6. The patient will be responsible for cost sharing (co-pay) of up to 20% of the service fee (after annual deductible is met).  Patient agreed to services and verbal consent obtained.   Follow up plan: Telephone appointment with CCM team member scheduled for: 09/20/2018  Tyrone  ??[email or web address]   ??[phone]

## 2018-09-13 DIAGNOSIS — R062 Wheezing: Secondary | ICD-10-CM | POA: Diagnosis not present

## 2018-09-13 DIAGNOSIS — J449 Chronic obstructive pulmonary disease, unspecified: Secondary | ICD-10-CM | POA: Diagnosis not present

## 2018-09-13 DIAGNOSIS — I509 Heart failure, unspecified: Secondary | ICD-10-CM | POA: Diagnosis not present

## 2018-09-20 ENCOUNTER — Telehealth: Payer: Medicare Other

## 2018-09-21 ENCOUNTER — Ambulatory Visit (INDEPENDENT_AMBULATORY_CARE_PROVIDER_SITE_OTHER): Payer: Medicare Other | Admitting: Pharmacist

## 2018-09-21 DIAGNOSIS — I5032 Chronic diastolic (congestive) heart failure: Secondary | ICD-10-CM | POA: Diagnosis not present

## 2018-09-21 DIAGNOSIS — N189 Chronic kidney disease, unspecified: Secondary | ICD-10-CM | POA: Diagnosis not present

## 2018-09-21 DIAGNOSIS — I10 Essential (primary) hypertension: Secondary | ICD-10-CM | POA: Diagnosis not present

## 2018-09-21 DIAGNOSIS — F329 Major depressive disorder, single episode, unspecified: Secondary | ICD-10-CM

## 2018-09-21 DIAGNOSIS — E118 Type 2 diabetes mellitus with unspecified complications: Secondary | ICD-10-CM

## 2018-09-21 DIAGNOSIS — Z794 Long term (current) use of insulin: Secondary | ICD-10-CM

## 2018-09-21 NOTE — Patient Instructions (Signed)
Goals Addressed            This Visit's Progress   . Expensive eye drops (pt-stated)       Current Barriers:  . financial  Pharmacist Clinical Goal(s): Over the next 14 days, Ms.Oswaldo Milian will provide the necessary supplementary documents (proof of out of pocket prescription expenditure, proof of household income) needed for medication assistance applications to CCM pharmacist.   Interventions: . CCM pharmacist will investigate available programs or foundations for Ms. Joiner's eye drops  Patient Self Care Activities:  [name] Gather necessary documents needed to apply for medication assistance  Initial goal documentation      . Pill Burden (pt-stated)        Current Barriers:  . High pill burden . Limited vision abilities  Pharmacist Clinical Goal(s): Over the next 30 days, Ms. Dysart will be adherent to all medications as evidenced by patient report. CCM clinical pharmacist will collaborate with prescribers to reduce medications if medically appropriate.   Interventions: . Patient educated on purpose, proper use and potential adverse effects of medications. Minette Brine Dr. Delton [place]irie re: Ergocalciferol and labs . Develop plan with Dr. [name] for coming off of insulin . DC ASA 81mg   . Make sure patient can read and visualize medications appropriately  Patient Self Care Activities:  . Continue to take all medications as prescribed  Initial goal documentation          Thank you allowing the Chronic Care Management Team to be a part of your care!   Please call a member of the CCM (Chronic Care Management) Team with any questions or case management needs:   Vanetta Mulders, BSN Nurse Care Coordinator  [phone]  Ruben Reason, PharmD  Clinical Pharmacist  [phone]  [place], LCSW Clinical Social Worker [phone]  The patient verbalized understanding of instructions provided today and declined a print copy of patient instruction  materials.

## 2018-09-21 NOTE — Chronic Care Management (AMB) (Signed)
Chronic Care Management   Note  09/21/2018 Name: Lori Stanley MRN: [id number] DOB: [birth year]  Subjective:   Does the patient  feel that his/her medications are working for him/her?  yes  Has the patient been experiencing any side effects to the medications prescribed?  no  Does the patient measure his/her own blood glucose at home?  Needs new glucometer   Does the patient measure his/her own blood pressure at home? yes   Does the patient have any problems obtaining medications due to transportation or finances?   no  Understanding of regimen: good Understanding of indications: good Potential of compliance: good  Objective: Lab Results  Component Value Date   CREATININE 2.85 (H) 02/13/2018   CREATININE 3.09 (H) 11/27/2017   CREATININE 2.67 (H) 08/28/2017    Lab Results  Component Value Date   HGBA1C 6.5 (A) 07/03/2018    Lipid Panel     Component Value Date/Time   CHOL 138 12/26/2016 1239   CHOL 137 05/13/2013 0344   TRIG 149 12/26/2016 1239   TRIG 104 05/13/2013 0344   HDL 44 12/26/2016 1239   HDL 43 05/13/2013 0344   CHOLHDL 2.9 12/15/2015 1546   VLDL 21 05/13/2013 0344   LDLCALC 64 12/26/2016 1239   LDLCALC 73 05/13/2013 0344    BP Readings from Last 3 Encounters:  07/03/18 (!) 124/54  05/15/18 (!) 142/54  02/13/18 132/62    Allergies  Allergen Reactions  . Antihistamines, Chlorpheniramine-Type   . Codeine   . Paxil [Paroxetine]     "makes her feel funny" not in a good way    Medications Reviewed Today    Reviewed by Cathi Roan, [facility] (Pharmacist) on 09/21/18 at 1027  Med List Status: <None>  Medication Order Taking? Sig Documenting Provider Last Dose Status Informant  acetaminophen (TYLENOL) 500 MG tablet [id number] Yes Take 1,000 mg by mouth every 6 (six) hours as needed. [provider] Taking Active   albuterol (PROVENTIL HFA;VENTOLIN HFA) 108 (90 Base) MCG/ACT inhaler [id number] No Inhale 2 puffs into the lungs every 6 (six)  hours as needed for wheezing or shortness of breath.  Patient not taking:  Reported on 09/21/2018   Loletha Grayer, MD Not Taking Active            Med Note Kary Kos, Blair Heys   Fri Sep 21, 2018 10:24 AM) Havent needed  amLODipine (NORVASC) 5 MG tablet [id number] Yes TAKE 1 TABLET BY MOUTH EVERY DAY Jerrol Banana., MD Taking Active   aspirin 81 MG EC tablet [id number] Yes TAKE 1 TABLET (81 MG TOTAL) BY MOUTH DAILY. Jerrol Banana., MD Taking Active   Bepotastine Besilate (BEPREVE) 1.5 % SOLN [id number] Yes Place 1 drop into both eyes daily. [provider] Taking Active   carvedilol (COREG) 12.5 MG tablet [id number] Yes Take 1 tablet (12.5 mg total) by mouth 2 (two) times daily. Jerrol Banana., MD Taking Active            Med Note [facility], Blair Heys   Fri Sep 21, 2018 10:26 AM) Heart "flutters" when stressed       Patient not taking:       Discontinued 09/21/18 1026 (Completed Course)            Med Note (Kendan Cornforth E   Fri Sep 21, 2018 10:19 AM) Feels heart flutter when stressed  furosemide (LASIX) 40 MG tablet [id number] Yes TAKE 1 TABLET BY MOUTH TWICE A DAY Miguel Aschoff  Kaylyn Lim., MD Taking Active            Med Note Kary Kos, Blair Heys   Fri Sep 21, 2018 10:20 AM) Getting up at night to go to the bathroom  Insulin Lispro Prot & Lispro (HUMALOG MIX 75/25 KWIKPEN) (75-25) 100 UNIT/ML Claiborne Rigg [id number] Yes Inject 4 Units into the skin 2 (two) times daily. Jerrol Banana., MD Taking Active   Insulin Pen Needle (B-D UF III MINI PEN NEEDLES) 31G X 5 MM MISC [id number] Yes USE TWICE A DAY AS DIRECTED Jerrol Banana., MD Taking Active   latanoprost (XALATAN) 0.005 % ophthalmic solution [id number] Yes Place 1 drop into the left eye at bedtime.  [provider] Taking Active   loratadine (CLARITIN) 10 MG tablet [id number] Yes Take 1 tablet (10 mg total) by mouth daily. Jerrol Banana., MD Taking Active   telmisartan (MICARDIS) 20 MG tablet  [id number] Yes Take 1 tablet (20 mg total) by mouth daily. Jerrol Banana., MD Taking Active   traZODone (DESYREL) 150 MG tablet [id number] Yes TAKE 1 TABLET (150 MG TOTAL) BY MOUTH AT BEDTIME. Jerrol Banana., MD Taking Active            Med Note [facility], Millee Denise E   Fri Sep 21, 2018 10:21 AM) Still not sleeping well  Vitamin D, Ergocalciferol, (DRISDOL) 50000 units CAPS capsule [id number] Yes Take 50,000 Units by mouth every 7 (seven) days. [provider] Taking Active            Assessment:   Indications for all medications: [x]  Yes       []  No  Intervention  YES NO  Explanation   Unnecessary drug therapy      Duplicate Therapy []  []     No indication [x]  []  ASA 81mg    Treating avoidable ADE []  []     Dose/Frequency      Dose form inappropriate for patient []  []     Dosing consideration [x]  []  Decrease and eventual DC of insulin   Other pertinent pharmacist  counseling   Hypoglycemia signs and symptoms; needs new glucometer from Dr. [name]; feelings of isolation and depression (currently not getting along with sister)   Goals Addressed            This Visit's Progress   . Expensive eye drops (pt-stated)       Current Barriers:  . financial  Pharmacist Clinical Goal(s): Over the next 14 days, Lori Stanley will provide the necessary supplementary documents (proof of out of pocket prescription expenditure, proof of household income) needed for medication assistance applications to CCM pharmacist.   Interventions: . CCM pharmacist will investigate available programs or foundations for Ms. Name's eye drops  Patient Self Care Activities:  [name] Gather necessary documents needed to apply for medication assistance  Initial goal documentation      . Pill Burden (pt-stated)        Current Barriers:  . High pill burden . Limited vision abilities  Pharmacist Clinical Goal(s): Over the next 30 days, Lori Stanley will be adherent to all medications  as evidenced by patient report. CCM clinical pharmacist will collaborate with prescribers to reduce medications if medically appropriate.   Interventions: . Patient educated on purpose, proper use and potential adverse effects of medications. Minette Brine Dr. Delton Prairie re: Ergocalciferol and labs . Develop plan with Dr. [name] for coming off of insulin . DC ASA 81mg   . Make sure patient can read and  visualize medications appropriately  Patient Self Care Activities:  . Continue to take all medications as prescribed  Initial goal documentation         Plan: Recommendations discussed with provider - DC aspirin 81mg , possibly ergocalciferol based on labs - plan for coming off Humalog 75/25 - patient needs new glucometer, testing supplies  Recommendations discussed with patient - take all medications as prescribed - s/sx of hypoglycemia and appropriate treatment - gather documents needed for medication assistance applications  Follow up: Telephone follow up appointment with CCM team member scheduled for: 2 weeks with PharmD   Ruben Reason, PharmD Clinical Pharmacist [place] [phone]

## 2018-09-22 ENCOUNTER — Other Ambulatory Visit: Payer: Self-pay | Admitting: Family Medicine

## 2018-09-25 ENCOUNTER — Telehealth: Payer: Self-pay

## 2018-09-25 NOTE — Telephone Encounter (Signed)
Called patient from recall.  No answer. LMOV.  This is the first attempt per recall list.

## 2018-10-01 ENCOUNTER — Telehealth: Payer: Self-pay

## 2018-10-01 NOTE — Telephone Encounter (Signed)
Please review. Thanks!  

## 2018-10-01 NOTE — Telephone Encounter (Signed)
A nurse from [facility] pt's kidney case manager/nurse called and pt is concerned about all the BP meds she is on and the nurse is asking exactly if the new bp med that was called in is she really needing that.  Pt is on several bp medications and and with her low kidney function the nurse is concerned if she really needs the new bp medication.    Nurse Amy call back at [phone] ext [phone]

## 2018-10-02 NOTE — Telephone Encounter (Signed)
0

## 2018-10-03 DIAGNOSIS — I129 Hypertensive chronic kidney disease with stage 1 through stage 4 chronic kidney disease, or unspecified chronic kidney disease: Secondary | ICD-10-CM | POA: Diagnosis not present

## 2018-10-03 DIAGNOSIS — N179 Acute kidney failure, unspecified: Secondary | ICD-10-CM | POA: Diagnosis not present

## 2018-10-03 DIAGNOSIS — D631 Anemia in chronic kidney disease: Secondary | ICD-10-CM | POA: Diagnosis not present

## 2018-10-03 DIAGNOSIS — N183 Chronic kidney disease, stage 3 (moderate): Secondary | ICD-10-CM | POA: Diagnosis not present

## 2018-10-03 DIAGNOSIS — R809 Proteinuria, unspecified: Secondary | ICD-10-CM | POA: Diagnosis not present

## 2018-10-04 ENCOUNTER — Telehealth: Payer: Self-pay

## 2018-10-08 ENCOUNTER — Encounter: Payer: Self-pay | Admitting: Pharmacist

## 2018-10-14 DIAGNOSIS — R062 Wheezing: Secondary | ICD-10-CM | POA: Diagnosis not present

## 2018-10-14 DIAGNOSIS — I509 Heart failure, unspecified: Secondary | ICD-10-CM | POA: Diagnosis not present

## 2018-10-14 DIAGNOSIS — J449 Chronic obstructive pulmonary disease, unspecified: Secondary | ICD-10-CM | POA: Diagnosis not present

## 2018-10-15 ENCOUNTER — Other Ambulatory Visit: Payer: Self-pay | Admitting: Family Medicine

## 2018-10-15 DIAGNOSIS — E119 Type 2 diabetes mellitus without complications: Secondary | ICD-10-CM | POA: Diagnosis not present

## 2018-10-15 DIAGNOSIS — B351 Tinea unguium: Secondary | ICD-10-CM | POA: Diagnosis not present

## 2018-10-15 DIAGNOSIS — M79671 Pain in right foot: Secondary | ICD-10-CM | POA: Diagnosis not present

## 2018-10-15 DIAGNOSIS — M84374A Stress fracture, right foot, initial encounter for fracture: Secondary | ICD-10-CM | POA: Diagnosis not present

## 2018-10-23 DIAGNOSIS — I120 Hypertensive chronic kidney disease with stage 5 chronic kidney disease or end stage renal disease: Secondary | ICD-10-CM | POA: Diagnosis not present

## 2018-10-23 DIAGNOSIS — N185 Chronic kidney disease, stage 5: Secondary | ICD-10-CM | POA: Diagnosis not present

## 2018-10-23 DIAGNOSIS — N2581 Secondary hyperparathyroidism of renal origin: Secondary | ICD-10-CM | POA: Diagnosis not present

## 2018-10-23 DIAGNOSIS — D631 Anemia in chronic kidney disease: Secondary | ICD-10-CM | POA: Diagnosis not present

## 2018-10-29 DIAGNOSIS — M79671 Pain in right foot: Secondary | ICD-10-CM | POA: Diagnosis not present

## 2018-11-05 ENCOUNTER — Encounter: Payer: Self-pay | Admitting: Family Medicine

## 2018-11-05 ENCOUNTER — Ambulatory Visit (INDEPENDENT_AMBULATORY_CARE_PROVIDER_SITE_OTHER): Payer: Medicare Other | Admitting: Family Medicine

## 2018-11-05 ENCOUNTER — Other Ambulatory Visit: Payer: Self-pay

## 2018-11-05 ENCOUNTER — Other Ambulatory Visit: Payer: Self-pay | Admitting: Family Medicine

## 2018-11-05 VITALS — BP 122/62 | HR 61 | Temp 97.9°F | Resp 16 | Ht 63.0 in | Wt 130.0 lb

## 2018-11-05 DIAGNOSIS — I10 Essential (primary) hypertension: Secondary | ICD-10-CM

## 2018-11-05 DIAGNOSIS — E118 Type 2 diabetes mellitus with unspecified complications: Secondary | ICD-10-CM

## 2018-11-05 DIAGNOSIS — E785 Hyperlipidemia, unspecified: Secondary | ICD-10-CM | POA: Diagnosis not present

## 2018-11-05 DIAGNOSIS — N189 Chronic kidney disease, unspecified: Secondary | ICD-10-CM | POA: Diagnosis not present

## 2018-11-05 DIAGNOSIS — Z794 Long term (current) use of insulin: Secondary | ICD-10-CM | POA: Diagnosis not present

## 2018-11-05 DIAGNOSIS — I129 Hypertensive chronic kidney disease with stage 1 through stage 4 chronic kidney disease, or unspecified chronic kidney disease: Secondary | ICD-10-CM

## 2018-11-05 DIAGNOSIS — E1322 Other specified diabetes mellitus with diabetic chronic kidney disease: Secondary | ICD-10-CM | POA: Diagnosis not present

## 2018-11-05 DIAGNOSIS — N184 Chronic kidney disease, stage 4 (severe): Secondary | ICD-10-CM

## 2018-11-05 MED ORDER — AMLODIPINE BESYLATE 5 MG PO TABS: 5.0000 mg | ORAL_TABLET | Freq: Every day | ORAL | 3 refills | Status: DC

## 2018-11-05 NOTE — Patient Instructions (Addendum)
1. Diabetes Mellitus Type ll  -Hemoglobin A1c -TSH -CMET -Lipid panel -CBC  2. Hypertension  -Hemoglobin A1c -TSH -CMET -Lipid panel -CBC  3. Lipid/Cholesterol  -Hemoglobin A1c -TSH -CMET -Lipid panel -CBC  Follow up 4 months.

## 2018-11-05 NOTE — Telephone Encounter (Signed)
Pt called and needs a refill on her amLODipine (NORVASC) 5 MG tablet   Please send to [facility] on [address].   Thank you, [facility]

## 2018-11-05 NOTE — Progress Notes (Signed)
Patient: Lori Stanley Female    DOB: 03/03/1932   85 y.o.   MRN: [id number] Visit Date: 11/05/2018  Today's Provider: [name], MD   Chief Complaint  Patient presents with  . Follow-up  . Diabetes   Subjective:    HPI     Diabetes Mellitus Type II, Follow-up:   Lab Results  Component Value Date   HGBA1C 6.5 (A) 07/03/2018   HGBA1C 7.6 (A) 11/27/2017   HGBA1C 8.5 07/05/2017    Last seen for diabetes 4 months ago.  Management since then includes cutting insulin back from 6 to 4 units daily. She reports good compliance with treatment. She is not having side effects. none Current symptoms include none and have been unchanged. Home blood sugar records: fasting range: 122  Episodes of hypoglycemia? no   Current insulin regiment: n/a Most Recent Eye Exam: 10/03/2017 Weight trend: stable Prior visit with dietician: No Current exercise: walking Current diet habits: well balanced  Pertinent Labs:    Component Value Date/Time   CHOL 138 12/26/2016 1239   CHOL 137 05/13/2013 0344   TRIG 149 12/26/2016 1239   TRIG 104 05/13/2013 0344   HDL 44 12/26/2016 1239   HDL 43 05/13/2013 0344   LDLCALC 64 12/26/2016 1239   LDLCALC 73 05/13/2013 0344   CREATININE 2.85 (H) 02/13/2018 1159   CREATININE 2.27 (H) 05/15/2013 0512    Wt Readings from Last 3 Encounters:  11/05/18 130 lb (59 kg)  07/03/18 129 lb (58.5 kg)  05/15/18 125 lb 6.4 oz (56.9 kg)     Hypertension, follow-up:  BP Readings from Last 3 Encounters:  11/05/18 122/62  07/03/18 (!) 124/54  05/15/18 (!) 142/54    She was last seen for hypertension 4 months ago.  BP at that visit was 124/54. Management since that visit includes stopping Quinapril and starting Losartan 50mg  due to excessive cough.  She reports good compliance with treatment. She is not having side effects. non She is exercising. She is adherent to low salt diet.   Outside blood pressures are normal. She is  experiencing none.  Patient denies none.     Lipid/Cholesterol, Follow-up:   Last seen for this4 months ago.  Management changes since that visit include no changes. . Last Lipid Panel:    Component Value Date/Time   CHOL 138 12/26/2016 1239   CHOL 137 05/13/2013 0344   TRIG 149 12/26/2016 1239   TRIG 104 05/13/2013 0344   HDL 44 12/26/2016 1239   HDL 43 05/13/2013 0344   CHOLHDL 2.9 12/15/2015 1546   VLDL 21 05/13/2013 0344   LDLCALC 64 12/26/2016 1239   LDLCALC 73 05/13/2013 0344    Risk factors for vascular disease include diabetes mellitus and hypercholesterolemia  She reports good compliance with treatment. She is not having side effects.  Current symptoms include none and have been unchanged. Overall pt feels pretty well. She sees nephtrology for CKD 4. Allergies  Allergen Reactions  . Antihistamines, Chlorpheniramine-Type   . Codeine   . Paxil [Paroxetine]     "makes her feel funny" not in a good way     Current Outpatient Medications:  .  acetaminophen (TYLENOL) 500 MG tablet, Take 1,000 mg by mouth every 6 (six) hours as needed., Disp: , Rfl:  .  albuterol (PROVENTIL HFA;VENTOLIN HFA) 108 (90 Base) MCG/ACT inhaler, Inhale 2 puffs into the lungs every 6 (six) hours as needed for wheezing or shortness of breath., Disp: 1  Inhaler, Rfl: 0 .  amLODipine (NORVASC) 5 MG tablet, TAKE 1 TABLET BY MOUTH EVERY DAY, Disp: 90 tablet, Rfl: 3 .  aspirin 81 MG EC tablet, TAKE 1 TABLET (81 MG TOTAL) BY MOUTH DAILY., Disp: 90 tablet, Rfl: 3 .  Bepotastine Besilate (BEPREVE) 1.5 % SOLN, Place 1 drop into both eyes daily., Disp: , Rfl:  .  calcitRIOL (ROCALTROL) 0.25 MCG capsule, TAKE 1 CAPSULE BY MOUTH THREE TIMES A WEEK TAKE MONDAY, WEDNESDAY AND FRIDAY, Disp: , Rfl:  .  carvedilol (COREG) 12.5 MG tablet, Take 1 tablet (12.5 mg total) by mouth 2 (two) times daily., Disp: 180 tablet, Rfl: 3 .  furosemide (LASIX) 40 MG tablet, TAKE 1 TABLET BY MOUTH TWICE A DAY, Disp: 180 tablet,  Rfl: 1 .  Insulin Lispro Prot & Lispro (HUMALOG MIX 75/25 KWIKPEN) (75-25) 100 UNIT/ML Kwikpen, Inject 4 Units into the skin 2 (two) times daily., Disp: 16 pen, Rfl: 12 .  Insulin Pen Needle (B-D UF III MINI PEN NEEDLES) 31G X 5 MM MISC, USE TWICE A DAY AS DIRECTED, Disp: 100 each, Rfl: 5 .  latanoprost (XALATAN) 0.005 % ophthalmic solution, Place 1 drop into the left eye at bedtime. , Disp: , Rfl:  .  loratadine (CLARITIN) 10 MG tablet, Take 1 tablet (10 mg total) by mouth daily., Disp: 90 tablet, Rfl: 3 .  quinapril (ACCUPRIL) 40 MG tablet, TAKE 1 TABLET BY MOUTH EVERY DAY, Disp: 90 tablet, Rfl: 3 .  telmisartan (MICARDIS) 20 MG tablet, Take 1 tablet (20 mg total) by mouth daily., Disp: 30 tablet, Rfl: 11 .  traZODone (DESYREL) 150 MG tablet, TAKE 1 TABLET (150 MG TOTAL) BY MOUTH AT BEDTIME., Disp: 90 tablet, Rfl: 3 .  Vitamin D, Ergocalciferol, (DRISDOL) 50000 units CAPS capsule, Take 50,000 Units by mouth every 7 (seven) days., Disp: , Rfl:   Review of Systems  Constitutional: Negative for activity change, appetite change, chills and fatigue.  Respiratory: Negative for cough and shortness of breath.   Cardiovascular: Negative for chest pain, palpitations and leg swelling.  Gastrointestinal: Negative.   Endocrine: Negative for cold intolerance, heat intolerance, polydipsia, polyphagia and polyuria.  Musculoskeletal: Negative for arthralgias, back pain, myalgias, neck pain and neck stiffness.  Neurological: Negative for dizziness, light-headedness and headaches.  Psychiatric/Behavioral: Negative for agitation, self-injury and sleep disturbance. The patient is not nervous/anxious.     Social History   Tobacco Use  . Smoking status: Former Smoker    Types: Cigarettes  . Smokeless tobacco: Never Used  . Tobacco comment: Quit in 2015-2016  Substance Use Topics  . Alcohol use: No      Objective:   BP 122/62 (BP Location: Left Arm, Patient Position: Sitting, Cuff Size: Normal)   Pulse  61   Temp 97.9 F (36.6 C) (Oral)   Resp 16   Ht 5\' 3"  (1.6 m)   Wt 130 lb (59 kg)   SpO2 97%   BMI 23.03 kg/m  Vitals:   11/05/18 1139  BP: 122/62  Pulse: 61  Resp: 16  Temp: 97.9 F (36.6 C)  TempSrc: Oral  SpO2: 97%  Weight: 130 lb (59 kg)  Height: 5\' 3"  (1.6 m)     Physical Exam Vitals signs reviewed.  Constitutional:      Appearance: She is well-developed.  HENT:     Head: Normocephalic and atraumatic.     Right Ear: External ear normal.     Left Ear: External ear normal.     Nose: Nose normal.  Eyes:     General: No scleral icterus.    Conjunctiva/sclera: Conjunctivae normal.  Neck:     Thyroid: No thyromegaly.  Cardiovascular:     Rate and Rhythm: Normal rate and regular rhythm.     Heart sounds: Normal heart sounds.  Pulmonary:     Effort: Pulmonary effort is normal.     Breath sounds: Normal breath sounds.  Abdominal:     Palpations: Abdomen is soft.  Musculoskeletal:     Comments: Trace edema.  Skin:    General: Skin is warm and dry.  Neurological:     General: No focal deficit present.     Mental Status: She is alert and oriented to person, place, and time.  Psychiatric:        Behavior: Behavior normal.        Thought Content: Thought content normal.        Judgment: Judgment normal.      No results found for any visits on 11/05/18.     Assessment & Plan    1. Type 2 diabetes mellitus with complication, with long-term current use of insulin (HCC) More than 50% of 25 minute visit spent in counseling or coordination of care  - Lipid panel - CBC w/Diff/Platelet - TSH - Hemoglobin A1C - Comprehensive Metabolic Panel (CMET)  2. Essential hypertension, malignant Controlled. - Lipid panel - CBC w/Diff/Platelet - TSH - Hemoglobin A1C - Comprehensive Metabolic Panel (CMET)  3. Chronic kidney disease, unspecified CKD stage  - Lipid panel - CBC w/Diff/Platelet - TSH - Hemoglobin A1C - Comprehensive Metabolic Panel (CMET)  4.  Secondary DM with CKD stage 4 and hypertension ([place]) Major issue going forward--followed by nephrology. - Lipid panel - CBC w/Diff/Platelet - TSH - Hemoglobin A1C - Comprehensive Metabolic Panel (CMET)  5. Hyperlipidemia, unspecified hyperlipidemia type  - Lipid panel - CBC w/Diff/Platelet - TSH - Hemoglobin A1C - Comprehensive Metabolic Panel (CMET)  I,April Miller,acting as a scribe for [name], MD.,have documented all relevant documentation on the behalf of [name], MD,as directed by  [name], MD while in the presence of [name], MD.   [name], MD  [facility]

## 2018-11-06 ENCOUNTER — Telehealth: Payer: Self-pay

## 2018-11-06 ENCOUNTER — Other Ambulatory Visit: Payer: Self-pay

## 2018-11-06 LAB — COMPREHENSIVE METABOLIC PANEL
ALT: 7 IU/L (ref 0–32)
AST: 14 IU/L (ref 0–40)
Albumin/Globulin Ratio: 1.7 (ref 1.2–2.2)
Albumin: 4.3 g/dL (ref 3.6–4.6)
Alkaline Phosphatase: 80 IU/L (ref 39–117)
BUN/Creatinine Ratio: 12 (ref 12–28)
BUN: 39 mg/dL — ABNORMAL HIGH (ref 8–27)
Bilirubin Total: <0.2 mg/dL (ref 0.0–1.2)
CO2: 23 mmol/L (ref 20–29)
Calcium: 9.7 mg/dL (ref 8.7–10.3)
Chloride: 102 mmol/L (ref 96–106)
Creatinine, Ser: 3.39 mg/dL — ABNORMAL HIGH (ref 0.57–1.00)
GFR calc Af Amer: 14 mL/min/{1.73_m2} — ABNORMAL LOW (ref >59)
GFR calc non Af Amer: 12 mL/min/{1.73_m2} — ABNORMAL LOW (ref >59)
Globulin, Total: 2.6 g/dL (ref 1.5–4.5)
Glucose: 120 mg/dL — ABNORMAL HIGH (ref 65–99)
Potassium: 4.9 mmol/L (ref 3.5–5.2)
Sodium: 144 mmol/L (ref 134–144)
Total Protein: 6.9 g/dL (ref 6.0–8.5)

## 2018-11-06 LAB — CBC WITH DIFFERENTIAL/PLATELET
Basophils Absolute: 0 10*3/uL (ref 0.0–0.2)
Basos: 0 %
EOS (ABSOLUTE): 0.1 10*3/uL (ref 0.0–0.4)
Eos: 2 %
Hematocrit: 30.1 % — ABNORMAL LOW (ref 34.0–46.6)
Hemoglobin: 9.7 g/dL — ABNORMAL LOW (ref 11.1–15.9)
Immature Grans (Abs): 0 10*3/uL (ref 0.0–0.1)
Immature Granulocytes: 0 %
Lymphocytes Absolute: 1 10*3/uL (ref 0.7–3.1)
Lymphs: 20 %
MCH: 26.9 pg (ref 26.6–33.0)
MCHC: 32.2 g/dL (ref 31.5–35.7)
MCV: 84 fL (ref 79–97)
Monocytes Absolute: 0.3 10*3/uL (ref 0.1–0.9)
Monocytes: 6 %
Neutrophils Absolute: 3.7 10*3/uL (ref 1.4–7.0)
Neutrophils: 72 %
Platelets: 245 10*3/uL (ref 150–450)
RBC: 3.6 x10E6/uL — ABNORMAL LOW (ref 3.77–5.28)
RDW: 13.3 % (ref 11.7–15.4)
WBC: 5.1 10*3/uL (ref 3.4–10.8)

## 2018-11-06 LAB — LIPID PANEL
Chol/HDL Ratio: 4.4 ratio (ref 0.0–4.4)
Cholesterol, Total: 233 mg/dL — ABNORMAL HIGH (ref 100–199)
HDL: 53 mg/dL (ref >39)
LDL Calculated: 163 mg/dL — ABNORMAL HIGH (ref 0–99)
Triglycerides: 83 mg/dL (ref 0–149)
VLDL Cholesterol Cal: 17 mg/dL (ref 5–40)

## 2018-11-06 LAB — TSH: TSH: 2.56 u[IU]/mL (ref 0.450–4.500)

## 2018-11-06 LAB — HEMOGLOBIN A1C
Est. average glucose Bld gHb Est-mCnc: 140 mg/dL
Hgb A1c MFr Bld: 6.5 % — ABNORMAL HIGH (ref 4.8–5.6)

## 2018-11-06 NOTE — Telephone Encounter (Signed)
Encounter error

## 2018-11-13 DIAGNOSIS — R062 Wheezing: Secondary | ICD-10-CM | POA: Diagnosis not present

## 2018-11-13 DIAGNOSIS — I509 Heart failure, unspecified: Secondary | ICD-10-CM | POA: Diagnosis not present

## 2018-11-13 DIAGNOSIS — J449 Chronic obstructive pulmonary disease, unspecified: Secondary | ICD-10-CM | POA: Diagnosis not present

## 2018-12-04 ENCOUNTER — Encounter (INDEPENDENT_AMBULATORY_CARE_PROVIDER_SITE_OTHER): Payer: Medicare Other | Admitting: Ophthalmology

## 2018-12-06 ENCOUNTER — Other Ambulatory Visit: Payer: Self-pay

## 2018-12-06 ENCOUNTER — Encounter (INDEPENDENT_AMBULATORY_CARE_PROVIDER_SITE_OTHER): Payer: Medicare Other | Admitting: Ophthalmology

## 2018-12-06 DIAGNOSIS — E11319 Type 2 diabetes mellitus with unspecified diabetic retinopathy without macular edema: Secondary | ICD-10-CM

## 2018-12-06 DIAGNOSIS — H35033 Hypertensive retinopathy, bilateral: Secondary | ICD-10-CM | POA: Diagnosis not present

## 2018-12-06 DIAGNOSIS — E113593 Type 2 diabetes mellitus with proliferative diabetic retinopathy without macular edema, bilateral: Secondary | ICD-10-CM

## 2018-12-06 DIAGNOSIS — I10 Essential (primary) hypertension: Secondary | ICD-10-CM | POA: Diagnosis not present

## 2018-12-14 DIAGNOSIS — R062 Wheezing: Secondary | ICD-10-CM | POA: Diagnosis not present

## 2018-12-14 DIAGNOSIS — I509 Heart failure, unspecified: Secondary | ICD-10-CM | POA: Diagnosis not present

## 2018-12-14 DIAGNOSIS — J449 Chronic obstructive pulmonary disease, unspecified: Secondary | ICD-10-CM | POA: Diagnosis not present

## 2018-12-20 ENCOUNTER — Encounter: Payer: Medicare Other | Admitting: Family Medicine

## 2018-12-27 ENCOUNTER — Telehealth: Payer: Self-pay | Admitting: Family Medicine

## 2018-12-27 NOTE — Telephone Encounter (Signed)
Please advise 

## 2018-12-27 NOTE — Telephone Encounter (Signed)
Patient is in need of a new Glucometer. Would like to get a One touch Brand sent to [facility] in [place].

## 2018-12-27 NOTE — Telephone Encounter (Signed)
Ok thanks 

## 2019-01-09 ENCOUNTER — Other Ambulatory Visit: Payer: Self-pay | Admitting: Family Medicine

## 2019-01-14 DIAGNOSIS — J449 Chronic obstructive pulmonary disease, unspecified: Secondary | ICD-10-CM | POA: Diagnosis not present

## 2019-01-14 DIAGNOSIS — I509 Heart failure, unspecified: Secondary | ICD-10-CM | POA: Diagnosis not present

## 2019-01-14 DIAGNOSIS — R062 Wheezing: Secondary | ICD-10-CM | POA: Diagnosis not present

## 2019-01-22 MED ORDER — ONETOUCH ULTRA MINI W/DEVICE KIT: 1.0000 | PACK | Freq: Every day | 0 refills | Status: DC

## 2019-01-22 NOTE — Telephone Encounter (Signed)
Lori Stanley with [employer] called asking if we can send an order for the diabetes One Touch Glucometer and test strips sent to [facility]

## 2019-01-22 NOTE — Telephone Encounter (Signed)
Sent into the pharmacy.  

## 2019-01-23 DIAGNOSIS — N2581 Secondary hyperparathyroidism of renal origin: Secondary | ICD-10-CM | POA: Diagnosis not present

## 2019-01-23 DIAGNOSIS — E1122 Type 2 diabetes mellitus with diabetic chronic kidney disease: Secondary | ICD-10-CM | POA: Diagnosis not present

## 2019-01-23 DIAGNOSIS — D631 Anemia in chronic kidney disease: Secondary | ICD-10-CM | POA: Insufficient documentation

## 2019-01-23 DIAGNOSIS — R809 Proteinuria, unspecified: Secondary | ICD-10-CM | POA: Diagnosis not present

## 2019-01-23 DIAGNOSIS — N185 Chronic kidney disease, stage 5: Secondary | ICD-10-CM | POA: Insufficient documentation

## 2019-01-23 DIAGNOSIS — N189 Chronic kidney disease, unspecified: Secondary | ICD-10-CM | POA: Insufficient documentation

## 2019-01-23 DIAGNOSIS — I129 Hypertensive chronic kidney disease with stage 1 through stage 4 chronic kidney disease, or unspecified chronic kidney disease: Secondary | ICD-10-CM | POA: Insufficient documentation

## 2019-01-23 DIAGNOSIS — I120 Hypertensive chronic kidney disease with stage 5 chronic kidney disease or end stage renal disease: Secondary | ICD-10-CM | POA: Diagnosis not present

## 2019-01-23 HISTORY — DX: Proteinuria, unspecified: R80.9

## 2019-01-23 HISTORY — DX: Chronic kidney disease, stage 5: N18.5

## 2019-01-29 ENCOUNTER — Ambulatory Visit (INDEPENDENT_AMBULATORY_CARE_PROVIDER_SITE_OTHER): Payer: Medicare Other | Admitting: Family Medicine

## 2019-01-29 ENCOUNTER — Other Ambulatory Visit: Payer: Self-pay

## 2019-01-29 DIAGNOSIS — Z23 Encounter for immunization: Secondary | ICD-10-CM

## 2019-01-29 DIAGNOSIS — D631 Anemia in chronic kidney disease: Secondary | ICD-10-CM | POA: Diagnosis not present

## 2019-01-29 DIAGNOSIS — E1122 Type 2 diabetes mellitus with diabetic chronic kidney disease: Secondary | ICD-10-CM | POA: Diagnosis not present

## 2019-01-29 DIAGNOSIS — N2581 Secondary hyperparathyroidism of renal origin: Secondary | ICD-10-CM | POA: Diagnosis not present

## 2019-01-29 DIAGNOSIS — R809 Proteinuria, unspecified: Secondary | ICD-10-CM | POA: Diagnosis not present

## 2019-01-29 DIAGNOSIS — N185 Chronic kidney disease, stage 5: Secondary | ICD-10-CM | POA: Diagnosis not present

## 2019-02-10 ENCOUNTER — Other Ambulatory Visit: Payer: Self-pay | Admitting: Family Medicine

## 2019-02-11 DIAGNOSIS — E119 Type 2 diabetes mellitus without complications: Secondary | ICD-10-CM | POA: Diagnosis not present

## 2019-02-11 DIAGNOSIS — B351 Tinea unguium: Secondary | ICD-10-CM | POA: Diagnosis not present

## 2019-02-13 DIAGNOSIS — J449 Chronic obstructive pulmonary disease, unspecified: Secondary | ICD-10-CM | POA: Diagnosis not present

## 2019-02-13 DIAGNOSIS — R062 Wheezing: Secondary | ICD-10-CM | POA: Diagnosis not present

## 2019-02-13 DIAGNOSIS — I509 Heart failure, unspecified: Secondary | ICD-10-CM | POA: Diagnosis not present

## 2019-03-07 NOTE — Progress Notes (Signed)
Patient: Lori Stanley Female    DOB: 02/18/1932   85 y.o.   MRN: [id number] Visit Date: 03/11/2019  Today's Provider: [name], MD   Chief Complaint  Patient presents with  . Follow-up  . Diabetes  . Hypertension   Subjective:     HPI  Depression an ongoing problem during pandemic.  Patient is upset today as his sister recently died.  Patient has had chronic mild depression for many years after losing a son at age 83 years ago. Type 2 diabetes mellitus with complication, with long-term current use of insulin ([place]) From 11/05/2018-labs checked. Mildly anemic--kidney function slowly worse--please fax to nephrology  Essential hypertension, malignant From 11/05/2018-labs checked. Mildly anemic--kidney function slowly worse--please fax to nephrology  Chronic kidney disease, unspecified CKD stage From 11/05/2018-labs checked. Mildly anemic--kidney function slowly worse--please fax to nephrology  Secondary DM with CKD stage 4 and hypertension ([place]) From 11/05/2018-Major issue going forward--followed by nephrology. Labs checked. Mildly anemic--kidney function slowly worse--please fax to nephrology.   Hyperlipidemia, unspecified hyperlipidemia type From 11/05/2018-labs checked. Mildly anemic--kidney function slowly worse--please fax to nephrology   Allergies  Allergen Reactions  . Antihistamines, Chlorpheniramine-Type   . Codeine   . Paxil [Paroxetine]     "makes her feel funny" not in a good way     Current Outpatient Medications:  .  Bepotastine Besilate (BEPREVE) 1.5 % SOLN, Place 1 drop into both eyes daily., Disp: , Rfl:  .  brimonidine (ALPHAGAN) 0.2 % ophthalmic solution, , Disp: , Rfl:  .  calcitRIOL (ROCALTROL) 0.25 MCG capsule, TAKE 1 CAPSULE BY MOUTH THREE TIMES A WEEK TAKE MONDAY, WEDNESDAY AND FRIDAY, Disp: , Rfl:  .  calcitRIOL (ROCALTROL) 0.25 MCG capsule, Take 0.25 mcg by mouth daily., Disp: , Rfl:  .  carvedilol (COREG) 12.5 MG tablet,  TAKE 1 TABLET (12.5 MG TOTAL) BY MOUTH 2 (TWO) TIMES DAILY., Disp: 180 tablet, Rfl: 1 .  dorzolamide-timolol (COSOPT) 22.3-6.8 MG/ML ophthalmic solution, , Disp: , Rfl:  .  furosemide (LASIX) 40 MG tablet, TAKE 1 TABLET BY MOUTH TWICE A DAY, Disp: 180 tablet, Rfl: 1 .  Insulin Lispro Prot & Lispro (HUMALOG MIX 75/25 KWIKPEN) (75-25) 100 UNIT/ML Kwikpen, Inject 4 Units into the skin 2 (two) times daily., Disp: 16 pen, Rfl: 12 .  Insulin Pen Needle (B-D UF III MINI PEN NEEDLES) 31G X 5 MM MISC, USE TWICE A DAY AS DIRECTED, Disp: 100 each, Rfl: 5 .  loratadine (CLARITIN) 10 MG tablet, Take 1 tablet (10 mg total) by mouth daily., Disp: 90 tablet, Rfl: 3 .  OneTouch Delica Lancets 67T MISC, See admin instructions., Disp: , Rfl:  .  ONETOUCH ULTRA test strip, See admin instructions., Disp: , Rfl:  .  quinapril (ACCUPRIL) 40 MG tablet, TAKE 1 TABLET BY MOUTH EVERY DAY, Disp: 90 tablet, Rfl: 3 .  telmisartan (MICARDIS) 20 MG tablet, Take 1 tablet (20 mg total) by mouth daily., Disp: 30 tablet, Rfl: 11 .  Vitamin D, Ergocalciferol, (DRISDOL) 50000 units CAPS capsule, Take 50,000 Units by mouth every 7 (seven) days., Disp: , Rfl:  .  acetaminophen (TYLENOL) 500 MG tablet, Take 1,000 mg by mouth every 6 (six) hours as needed., Disp: , Rfl:  .  albuterol (PROVENTIL HFA;VENTOLIN HFA) 108 (90 Base) MCG/ACT inhaler, Inhale 2 puffs into the lungs every 6 (six) hours as needed for wheezing or shortness of breath., Disp: 1 Inhaler, Rfl: 0 .  amLODipine (NORVASC) 5 MG tablet, Take 1  tablet (5 mg total) by mouth daily., Disp: 90 tablet, Rfl: 3 .  ASPIRIN LOW DOSE 81 MG EC tablet, TAKE 1 TABLET (81 MG TOTAL) BY MOUTH DAILY., Disp: 90 tablet, Rfl: 1 .  Blood Glucose Monitoring Suppl (ONE TOUCH ULTRA MINI) w/Device KIT, 1 kit by Does not apply route daily., Disp: 1 kit, Rfl: 0 .  latanoprost (XALATAN) 0.005 % ophthalmic solution, Place 1 drop into the left eye at bedtime. , Disp: , Rfl:  .  sertraline (ZOLOFT) 25 MG  tablet, Take 1 tablet (25 mg total) by mouth daily., Disp: 30 tablet, Rfl: 3 .  traZODone (DESYREL) 150 MG tablet, TAKE 1 TABLET (150 MG TOTAL) BY MOUTH AT BEDTIME., Disp: 90 tablet, Rfl: 3  Review of Systems  Constitutional: Negative for appetite change, chills, fatigue and fever.  Eyes: Negative.   Respiratory: Negative for chest tightness and shortness of breath.   Cardiovascular: Negative for chest pain and palpitations.  Gastrointestinal: Negative for abdominal pain, nausea and vomiting.  Endocrine: Negative.   Genitourinary: Negative.   Musculoskeletal: Positive for arthralgias.  Allergic/Immunologic: Negative.   Neurological: Negative for dizziness and weakness.  Psychiatric/Behavioral: Positive for dysphoric mood.    Diabetes Mellitus Type II, Follow-up:   Lab Results  Component Value Date   HGBA1C 6.4 (A) 03/11/2019   HGBA1C 6.5 (H) 11/05/2018   HGBA1C 6.5 (A) 07/03/2018    Last seen for diabetes 4 months ago.  Management since then includes Insulin,  She reports good compliance with treatment. She is not having side effects Home blood sugar records: fasting range: 77.  Episodes of hypoglycemia? no   Weight trend: stable Prior visit with dietician: yes -  Current diet: in general, a "healthy" diet   Current exercise: Not able to due to arms and knee pain.  ------------------------------------------------------------------------   Hypertension, follow-up:  BP Readings from Last 3 Encounters:  03/11/19 (!) 145/54  11/05/18 122/62  07/03/18 (!) 124/54     Lipid/Cholesterol, Follow-up:    Last Lipid Panel:    Component Value Date/Time   CHOL 233 (H) 11/05/2018 1158   CHOL 137 05/13/2013 0344   TRIG 83 11/05/2018 1158   TRIG 104 05/13/2013 0344   HDL 53 11/05/2018 1158   HDL 43 05/13/2013 0344   CHOLHDL 4.4 11/05/2018 1158   VLDL 21 05/13/2013 0344   LDLCALC 163 (H) 11/05/2018 1158   LDLCALC 73 05/13/2013 0344    Wt Readings from Last 3  Encounters:  03/11/19 135 lb (61.2 kg)  11/05/18 130 lb (59 kg)  07/03/18 129 lb (58.5 kg)    ------------------------------------------------------------------------  Social History   Tobacco Use  . Smoking status: Former Smoker    Types: Cigarettes  . Smokeless tobacco: Never Used  . Tobacco comment: Quit in 2015-2016  Substance Use Topics  . Alcohol use: No      Objective:   BP (!) 145/54   Pulse 85   Temp (!) 96.9 F (36.1 C) (Temporal)   Resp 16   Ht 5' 3" (1.6 m)   Wt 135 lb (61.2 kg)   BMI 23.91 kg/m  Vitals:   03/11/19 1055  BP: (!) 145/54  Pulse: 85  Resp: 16  Temp: (!) 96.9 F (36.1 C)  TempSrc: Temporal  Weight: 135 lb (61.2 kg)  Height: 5' 3" (1.6 m)  Body mass index is 23.91 kg/m.   Physical Exam Vitals signs reviewed.  Constitutional:      Appearance: Normal appearance. She is well-developed.  Comments: Pleasant BF NAD.  HENT:     Head: Normocephalic and atraumatic.     Right Ear: External ear normal.     Left Ear: External ear normal.     Nose: Nose normal.  Eyes:     General: No scleral icterus.    Conjunctiva/sclera: Conjunctivae normal.  Neck:     Thyroid: No thyromegaly.  Cardiovascular:     Rate and Rhythm: Normal rate and regular rhythm.     Heart sounds: Normal heart sounds.  Pulmonary:     Effort: Pulmonary effort is normal.     Breath sounds: Normal breath sounds.  Abdominal:     Palpations: Abdomen is soft.  Musculoskeletal:     Comments: Trace edema.  Skin:    General: Skin is warm and dry.  Neurological:     General: No focal deficit present.     Mental Status: She is alert and oriented to person, place, and time.  Psychiatric:        Behavior: Behavior normal.        Thought Content: Thought content normal.        Judgment: Judgment normal.      Results for orders placed or performed in visit on 03/11/19  POCT HgB A1C  Result Value Ref Range   Hemoglobin A1C 6.4 (A) 4.0 - 5.6 %   Est. average glucose  Bld gHb Est-mCnc 137        Assessment & Plan       1. Type 2 diabetes mellitus with complication, with long-term current use of insulin (HCC) Good control with A1c of 6.4 today. - POCT HgB A1C  2. Reactive depression Continue sertraline indefinitely.  She has been on this about 20 years.  I told him there is any reason to increase the dose at this time. - sertraline (ZOLOFT) 25 MG tablet; Take 1 tablet (25 mg total) by mouth daily.  Dispense: 30 tablet; Refill: 3  3. Essential hypertension, malignant Controlled on Coreg, quinapril, amlodipine  4. Chronic diastolic heart failure (HCC)   5. Gastro-esophageal reflux disease without esophagitis   6. Type 2 diabetes mellitus with stage 5 chronic kidney disease not on chronic dialysis, with long-term current use of insulin (HCC) Followed by nephrology.  7. Hyperlipidemia, unspecified hyperlipidemia type Could not tolerate statins.    Richard Gilbert Jr, MD  [facility]  

## 2019-03-11 ENCOUNTER — Other Ambulatory Visit: Payer: Self-pay

## 2019-03-11 ENCOUNTER — Encounter: Payer: Self-pay | Admitting: Family Medicine

## 2019-03-11 ENCOUNTER — Ambulatory Visit (INDEPENDENT_AMBULATORY_CARE_PROVIDER_SITE_OTHER): Payer: Medicare Other | Admitting: Family Medicine

## 2019-03-11 VITALS — BP 145/54 | HR 85 | Temp 96.9°F | Resp 16 | Ht 63.0 in | Wt 135.0 lb

## 2019-03-11 DIAGNOSIS — I5032 Chronic diastolic (congestive) heart failure: Secondary | ICD-10-CM | POA: Diagnosis not present

## 2019-03-11 DIAGNOSIS — F329 Major depressive disorder, single episode, unspecified: Secondary | ICD-10-CM

## 2019-03-11 DIAGNOSIS — Z794 Long term (current) use of insulin: Secondary | ICD-10-CM

## 2019-03-11 DIAGNOSIS — N185 Chronic kidney disease, stage 5: Secondary | ICD-10-CM

## 2019-03-11 DIAGNOSIS — I10 Essential (primary) hypertension: Secondary | ICD-10-CM

## 2019-03-11 DIAGNOSIS — E118 Type 2 diabetes mellitus with unspecified complications: Secondary | ICD-10-CM

## 2019-03-11 DIAGNOSIS — E785 Hyperlipidemia, unspecified: Secondary | ICD-10-CM

## 2019-03-11 DIAGNOSIS — E1122 Type 2 diabetes mellitus with diabetic chronic kidney disease: Secondary | ICD-10-CM

## 2019-03-11 DIAGNOSIS — K219 Gastro-esophageal reflux disease without esophagitis: Secondary | ICD-10-CM

## 2019-03-11 LAB — POCT GLYCOSYLATED HEMOGLOBIN (HGB A1C)
Est. average glucose Bld gHb Est-mCnc: 137
Hemoglobin A1C: 6.4 % — AB (ref 4.0–5.6)

## 2019-03-11 MED ORDER — SERTRALINE HCL 25 MG PO TABS: 25.0000 mg | ORAL_TABLET | Freq: Every day | ORAL | 3 refills | Status: DC

## 2019-03-11 NOTE — Patient Instructions (Signed)
No Changes are being made at this visit.

## 2019-03-16 DIAGNOSIS — J449 Chronic obstructive pulmonary disease, unspecified: Secondary | ICD-10-CM | POA: Diagnosis not present

## 2019-03-16 DIAGNOSIS — I509 Heart failure, unspecified: Secondary | ICD-10-CM | POA: Diagnosis not present

## 2019-03-16 DIAGNOSIS — R062 Wheezing: Secondary | ICD-10-CM | POA: Diagnosis not present

## 2019-04-02 ENCOUNTER — Other Ambulatory Visit: Payer: Self-pay | Admitting: Family Medicine

## 2019-04-02 DIAGNOSIS — F329 Major depressive disorder, single episode, unspecified: Secondary | ICD-10-CM

## 2019-04-02 NOTE — Telephone Encounter (Signed)
Requested medication (s) are due for refill today: yes  Requested medication (s) are on the active medication list: yes  Last refill: 03/11/2019  Future visit scheduled: yes  Notes to clinic: requesting a 90 day supply    Requested Prescriptions  Pending Prescriptions Disp Refills   sertraline (ZOLOFT) 25 MG tablet [Pharmacy Med Name: SERTRALINE HCL 25 MG TABLET] 90 tablet 2    Sig: TAKE 1 TABLET BY [facility]     Psychiatry:  Antidepressants - SSRI Passed - 04/02/2019 11:31 AM      Passed - Valid encounter within last 6 months    Recent Outpatient Visits          3 weeks ago Type 2 diabetes mellitus with complication, with long-term current use of insulin [facility])   [facility] Jerrol Banana., MD   4 months ago Type 2 diabetes mellitus with complication, with long-term current use of insulin [facility])   [facility] Jerrol Banana., MD   9 months ago Type 2 diabetes mellitus with complication, with long-term current use of insulin [facility])   [facility] Jerrol Banana., MD   1 year ago Type 2 diabetes mellitus with diabetic nephropathy, without long-term current use of insulin [facility])   [facility] Jerrol Banana., MD   1 year ago Type 2 diabetes mellitus with diabetic nephropathy, without long-term current use of insulin [facility])   [facility] Jerrol Banana., MD      Future Appointments            In 1 week Jerrol Banana., MD [facility]   In 1 month  [facility] [place]   In 3 months Jerrol Banana., MD [facility]           Passed - Completed PHQ-2 or PHQ-9 in the last 360 days.

## 2019-04-05 NOTE — Progress Notes (Signed)
Patient: Lori Stanley Female    DOB: 08/01/1931   85 y.o.   MRN: [id number] Visit Date: 04/10/2019  Today's Provider: [name], MD   Chief Complaint  Patient presents with  . Depression   Subjective:     HPI   Reactive depression From 03/11/2019-Continue sertraline 25 MG tablet indefinitely.  She has been on this about 20 years.  I told her there is any reason to increase the dose at this time. Today the patient reports to me that she was just started on the Sertraline on her last visit.  She reports that she was not on it. She reports that she is doing well today.  She feels better and is sleeping better than before her previous visit. She reports feeling less sad. PHQ9 today is 0  CKD followed by Dr. Rolly Salter. Allergies  Allergen Reactions  . Antihistamines, Chlorpheniramine-Type   . Codeine   . Paxil [Paroxetine]     "makes her feel funny" not in a good way     Current Outpatient Medications:  .  acetaminophen (TYLENOL) 500 MG tablet, Take 1,000 mg by mouth every 6 (six) hours as needed., Disp: , Rfl:  .  albuterol (PROVENTIL HFA;VENTOLIN HFA) 108 (90 Base) MCG/ACT inhaler, Inhale 2 puffs into the lungs every 6 (six) hours as needed for wheezing or shortness of breath., Disp: 1 Inhaler, Rfl: 0 .  amLODipine (NORVASC) 5 MG tablet, Take 1 tablet (5 mg total) by mouth daily., Disp: 90 tablet, Rfl: 3 .  ASPIRIN LOW DOSE 81 MG EC tablet, TAKE 1 TABLET (81 MG TOTAL) BY MOUTH DAILY., Disp: 90 tablet, Rfl: 1 .  Bepotastine Besilate (BEPREVE) 1.5 % SOLN, Place 1 drop into both eyes daily., Disp: , Rfl:  .  Blood Glucose Monitoring Suppl (ONE TOUCH ULTRA MINI) w/Device KIT, 1 kit by Does not apply route daily., Disp: 1 kit, Rfl: 0 .  brimonidine (ALPHAGAN) 0.2 % ophthalmic solution, , Disp: , Rfl:  .  calcitRIOL (ROCALTROL) 0.25 MCG capsule, TAKE 1 CAPSULE BY MOUTH THREE TIMES A WEEK TAKE MONDAY, WEDNESDAY AND FRIDAY, Disp: , Rfl:  .  calcitRIOL (ROCALTROL)  0.25 MCG capsule, Take 0.25 mcg by mouth daily., Disp: , Rfl:  .  carvedilol (COREG) 12.5 MG tablet, TAKE 1 TABLET (12.5 MG TOTAL) BY MOUTH 2 (TWO) TIMES DAILY., Disp: 180 tablet, Rfl: 1 .  dorzolamide-timolol (COSOPT) 22.3-6.8 MG/ML ophthalmic solution, , Disp: , Rfl:  .  furosemide (LASIX) 40 MG tablet, TAKE 1 TABLET BY MOUTH TWICE A DAY, Disp: 180 tablet, Rfl: 1 .  Insulin Lispro Prot & Lispro (HUMALOG MIX 75/25 KWIKPEN) (75-25) 100 UNIT/ML Kwikpen, Inject 4 Units into the skin 2 (two) times daily., Disp: 16 pen, Rfl: 12 .  Insulin Pen Needle (B-D UF III MINI PEN NEEDLES) 31G X 5 MM MISC, USE TWICE A DAY AS DIRECTED, Disp: 100 each, Rfl: 5 .  latanoprost (XALATAN) 0.005 % ophthalmic solution, Place 1 drop into the left eye at bedtime. , Disp: , Rfl:  .  loratadine (CLARITIN) 10 MG tablet, Take 1 tablet (10 mg total) by mouth daily., Disp: 90 tablet, Rfl: 3 .  OneTouch Delica Lancets 32T MISC, See admin instructions., Disp: , Rfl:  .  ONETOUCH ULTRA test strip, See admin instructions., Disp: , Rfl:  .  quinapril (ACCUPRIL) 40 MG tablet, TAKE 1 TABLET BY MOUTH EVERY DAY, Disp: 90 tablet, Rfl: 3 .  sertraline (ZOLOFT) 25 MG tablet, TAKE 1  TABLET BY MOUTH EVERY DAY, Disp: 90 tablet, Rfl: 2 .  telmisartan (MICARDIS) 20 MG tablet, Take 1 tablet (20 mg total) by mouth daily., Disp: 30 tablet, Rfl: 11 .  traZODone (DESYREL) 150 MG tablet, TAKE 1 TABLET (150 MG TOTAL) BY MOUTH AT BEDTIME., Disp: 90 tablet, Rfl: 3 .  Vitamin D, Ergocalciferol, (DRISDOL) 50000 units CAPS capsule, Take 50,000 Units by mouth every 7 (seven) days., Disp: , Rfl:   Review of Systems  Constitutional: Negative for appetite change, chills, fatigue and fever.  HENT: Negative.   Eyes: Negative.   Respiratory: Negative for chest tightness and shortness of breath.   Cardiovascular: Negative for chest pain and palpitations.  Gastrointestinal: Negative for abdominal pain, nausea and vomiting.  Endocrine: Negative.    Musculoskeletal: Positive for arthralgias.  Skin: Negative.   Allergic/Immunologic: Negative.   Neurological: Negative for dizziness and weakness.  Psychiatric/Behavioral: Negative for sleep disturbance. The patient is not nervous/anxious.     Social History   Tobacco Use  . Smoking status: Former Smoker    Types: Cigarettes  . Smokeless tobacco: Never Used  . Tobacco comment: Quit in 2015-2016  Substance Use Topics  . Alcohol use: No      Objective:   BP (!) 148/70 (BP Location: Left Arm, Patient Position: Sitting, Cuff Size: Normal)   Pulse 88   Temp (!) 96.8 F (36 C) (Skin)   Wt 137 lb (62.1 kg)   SpO2 93%   BMI 24.27 kg/m  Vitals:   04/10/19 1140 04/10/19 1149  BP: (!) 164/70 (!) 148/70  Pulse: 88   Temp: (!) 96.8 F (36 C)   TempSrc: Skin   SpO2: 93%   Weight: 137 lb (62.1 kg)   Body mass index is 24.27 kg/m.  Vitals:   04/10/19 1140 04/10/19 1149  BP: (!) 164/70 (!) 148/70  Pulse: 88   Temp: (!) 96.8 F (36 C)   TempSrc: Skin   SpO2: 93%   Weight: 137 lb (62.1 kg)      Physical Exam Vitals reviewed.  Constitutional:      Appearance: She is well-developed.  HENT:     Head: Normocephalic and atraumatic.     Right Ear: External ear normal.     Left Ear: External ear normal.     Nose: Nose normal.  Eyes:     General: No scleral icterus.    Conjunctiva/sclera: Conjunctivae normal.  Neck:     Thyroid: No thyromegaly.  Cardiovascular:     Rate and Rhythm: Normal rate and regular rhythm.     Heart sounds: Normal heart sounds.  Pulmonary:     Effort: Pulmonary effort is normal.     Breath sounds: Normal breath sounds.  Abdominal:     Palpations: Abdomen is soft.  Musculoskeletal:     Comments: 1+ edema.  Skin:    General: Skin is warm and dry.  Neurological:     General: No focal deficit present.     Mental Status: She is alert and oriented to person, place, and time.  Psychiatric:        Behavior: Behavior normal.        Thought  Content: Thought content normal.        Judgment: Judgment normal.      No results found for any visits on 04/10/19.  Depression screen [facility] 2/9 04/10/2019 03/11/2019 05/15/2018 08/28/2017 08/28/2017  Decreased Interest 0 0 0 0 0  Down, Depressed, Hopeless 0 2 1 0 0  PHQ -  2 Score 0 2 1 0 0  Altered sleeping 0 2 - 1 -  Tired, decreased energy 0 3 - 1 -  Change in appetite 0 0 - 0 -  Feeling bad or failure about yourself  0 0 - 0 -  Trouble concentrating 0 0 - 0 -  Moving slowly or fidgety/restless 0 0 - 0 -  Suicidal thoughts 0 0 - 0 -  PHQ-9 Score 0 7 - 2 -  Difficult doing work/chores Not difficult at all Somewhat difficult - Not difficult at all -      Assessment & Plan     1. Type 2 diabetes mellitus with stage 5 chronic kidney disease not on chronic dialysis, with long-term current use of insulin (HCC) Lower insulin dose to 2 units in the morning and 2 units at night.  I am worried about hypoglycemia in this 85 year old - Hemoglobin A1c - Renal Function Panel Nephrology following stage V kidney disease 2. Essential (primary) hypertension Fair control.  3. Chronic diastolic heart failure ([place])   4. Reactive depression Patient is coping well.  5. Anxiety Chronic and stable.  6. Anemia in stage 5 chronic kidney disease, not on chronic dialysis (HCC) Chronic.  Per lab results 04/10/19- Lower insulin dose to 2 Units in the morning and 2 Units at night.      Richard Cranford Mon, MD  [facility]

## 2019-04-10 ENCOUNTER — Other Ambulatory Visit: Payer: Self-pay

## 2019-04-10 ENCOUNTER — Ambulatory Visit (INDEPENDENT_AMBULATORY_CARE_PROVIDER_SITE_OTHER): Payer: Medicare Other | Admitting: Family Medicine

## 2019-04-10 VITALS — BP 148/70 | HR 88 | Temp 96.8°F | Wt 137.0 lb

## 2019-04-10 DIAGNOSIS — Z794 Long term (current) use of insulin: Secondary | ICD-10-CM | POA: Diagnosis not present

## 2019-04-10 DIAGNOSIS — F329 Major depressive disorder, single episode, unspecified: Secondary | ICD-10-CM

## 2019-04-10 DIAGNOSIS — E1122 Type 2 diabetes mellitus with diabetic chronic kidney disease: Secondary | ICD-10-CM

## 2019-04-10 DIAGNOSIS — F419 Anxiety disorder, unspecified: Secondary | ICD-10-CM

## 2019-04-10 DIAGNOSIS — I10 Essential (primary) hypertension: Secondary | ICD-10-CM | POA: Diagnosis not present

## 2019-04-10 DIAGNOSIS — N185 Chronic kidney disease, stage 5: Secondary | ICD-10-CM

## 2019-04-10 DIAGNOSIS — D631 Anemia in chronic kidney disease: Secondary | ICD-10-CM

## 2019-04-10 DIAGNOSIS — I5032 Chronic diastolic (congestive) heart failure: Secondary | ICD-10-CM | POA: Diagnosis not present

## 2019-04-11 LAB — HEMOGLOBIN A1C
Est. average glucose Bld gHb Est-mCnc: 126 mg/dL
Hgb A1c MFr Bld: 6 % — ABNORMAL HIGH (ref 4.8–5.6)

## 2019-04-11 LAB — RENAL FUNCTION PANEL
Albumin: 4.1 g/dL (ref 3.6–4.6)
BUN/Creatinine Ratio: 13 (ref 12–28)
BUN: 40 mg/dL — ABNORMAL HIGH (ref 8–27)
CO2: 21 mmol/L (ref 20–29)
Calcium: 9.9 mg/dL (ref 8.7–10.3)
Chloride: 106 mmol/L (ref 96–106)
Creatinine, Ser: 3.2 mg/dL — ABNORMAL HIGH (ref 0.57–1.00)
GFR calc Af Amer: 15 mL/min/{1.73_m2} — ABNORMAL LOW (ref >59)
GFR calc non Af Amer: 13 mL/min/{1.73_m2} — ABNORMAL LOW (ref >59)
Glucose: 121 mg/dL — ABNORMAL HIGH (ref 65–99)
Phosphorus: 3.7 mg/dL (ref 3.0–4.3)
Potassium: 4.4 mmol/L (ref 3.5–5.2)
Sodium: 143 mmol/L (ref 134–144)

## 2019-04-12 ENCOUNTER — Telehealth: Payer: Self-pay

## 2019-04-12 NOTE — Telephone Encounter (Signed)
Called and spoke with patient and informed her of the lab results. She will start taking 2 Units in the morning and 2 Units at night. She gave verbal understanding

## 2019-04-12 NOTE — Telephone Encounter (Signed)
-----   Message from Jerrol Banana., MD sent at 04/11/2019  9:55 AM EST ----- Labs stable.  Blood sugars very tightly controlled.  I would like to cut her insulin back by half--lower dose to half his of what it is now

## 2019-04-15 DIAGNOSIS — I509 Heart failure, unspecified: Secondary | ICD-10-CM | POA: Diagnosis not present

## 2019-04-15 DIAGNOSIS — R062 Wheezing: Secondary | ICD-10-CM | POA: Diagnosis not present

## 2019-04-15 DIAGNOSIS — J449 Chronic obstructive pulmonary disease, unspecified: Secondary | ICD-10-CM | POA: Diagnosis not present

## 2019-04-19 ENCOUNTER — Other Ambulatory Visit: Payer: Self-pay | Admitting: Family Medicine

## 2019-04-20 ENCOUNTER — Other Ambulatory Visit: Payer: Self-pay | Admitting: Family Medicine

## 2019-04-25 ENCOUNTER — Other Ambulatory Visit: Payer: Self-pay | Admitting: Family Medicine

## 2019-04-30 DIAGNOSIS — E1122 Type 2 diabetes mellitus with diabetic chronic kidney disease: Secondary | ICD-10-CM | POA: Diagnosis not present

## 2019-04-30 DIAGNOSIS — N2581 Secondary hyperparathyroidism of renal origin: Secondary | ICD-10-CM | POA: Diagnosis not present

## 2019-04-30 DIAGNOSIS — N185 Chronic kidney disease, stage 5: Secondary | ICD-10-CM | POA: Diagnosis not present

## 2019-04-30 DIAGNOSIS — D631 Anemia in chronic kidney disease: Secondary | ICD-10-CM | POA: Diagnosis not present

## 2019-04-30 DIAGNOSIS — I120 Hypertensive chronic kidney disease with stage 5 chronic kidney disease or end stage renal disease: Secondary | ICD-10-CM | POA: Diagnosis not present

## 2019-05-10 ENCOUNTER — Other Ambulatory Visit: Payer: Self-pay | Admitting: Family Medicine

## 2019-05-13 DIAGNOSIS — E119 Type 2 diabetes mellitus without complications: Secondary | ICD-10-CM | POA: Diagnosis not present

## 2019-05-13 DIAGNOSIS — B351 Tinea unguium: Secondary | ICD-10-CM | POA: Diagnosis not present

## 2019-05-16 DIAGNOSIS — R062 Wheezing: Secondary | ICD-10-CM | POA: Diagnosis not present

## 2019-05-16 DIAGNOSIS — I509 Heart failure, unspecified: Secondary | ICD-10-CM | POA: Diagnosis not present

## 2019-05-16 DIAGNOSIS — J449 Chronic obstructive pulmonary disease, unspecified: Secondary | ICD-10-CM | POA: Diagnosis not present

## 2019-05-17 ENCOUNTER — Ambulatory Visit: Payer: Medicare Other

## 2019-05-22 NOTE — Progress Notes (Signed)
Subjective:   Lori Stanley is a 85 y.o. female who presents for Medicare Annual (Subsequent) preventive examination.    This visit is being conducted through telemedicine due to the COVID-19 pandemic. This patient has given me verbal consent via doximity to conduct this visit, patient states they are participating from their home address. Some vital signs may be absent or patient reported.    Patient identification: identified by name, DOB, and current address  Review of Systems:  N/A  Cardiac Risk Factors include: advanced age (>24mn, >>16women);diabetes mellitus;dyslipidemia;hypertension     Objective:     Vitals: There were no vitals taken for this visit.  There is no height or weight on file to calculate BMI. Unable to obtain vitals due to visit being conducted via telephonically.   Advanced Directives 05/23/2019 05/15/2018 04/10/2017  Does Patient Have a Medical Advance Directive? Yes Yes No  Type of AParamedicof [place]edar [place]Living will [place] will -  Copy of [place]? No - copy requested No - copy requested -  Would patient like information on creating a medical advance directive? - - No - Patient declined    Tobacco Social History   Tobacco Use  Smoking Status Former Smoker  . Types: Cigarettes  Smokeless Tobacco Never Used  Tobacco Comment   Quit in 2015-2016     Counseling given: Not Answered Comment: Quit in 2015-2016   Clinical Intake:  Pre-visit preparation completed: Yes  Pain : No/denies pain Pain Score: 0-No pain     Nutritional Risks: None Diabetes: Yes  How often do you need to have someone help you when you read instructions, pamphlets, or other written materials from your doctor or pharmacy?: 1 - Never   Diabetes:  Is the patient diabetic?  Yes  If diabetic, was a CBG obtained today?  No  Did the patient bring in their glucometer from home?  No  How  often do you monitor your CBG's? 1-2 xs daily.   Financial Strains and Diabetes Management:  Are you having any financial strains with the device, your supplies or your medication? No .  Does the patient want to be seen by Chronic Care Management for management of their diabetes?  No  Would the patient like to be referred to a Nutritionist or for Diabetic Management?  No   Diabetic Exams:  Diabetic Eye Exam: Completed 10/03/17. Overdue for diabetic eye exam. Pt has been advised about the importance in completing this exam. Next eye exam scheduled for 06/2019.  Diabetic Foot Exam: Completed 04/18/17. Pt has been advised about the importance in completing this exam. Note made to follow up on this at next in office apt.    Interpreter Needed?: No  Information entered by :: Lori Stanley  Past Medical History:  Diagnosis Date  . Acute heart failure ([place]   . Chronic low back pain   . Diabetes mellitus without complication ([place]   . Hyperlipidemia   . Hypertension   . MI (myocardial infarction) ([place]   . Obesity    Past Surgical History:  Procedure Laterality Date  . EYE SURGERY    . KNEE SURGERY    . THYROID SURGERY    . VAGINAL HYSTERECTOMY     Family History  Problem Relation Age of Onset  . Heart attack Mother   . Heart disease Sister   . Cancer Sister    Social History   Socioeconomic History  .  Marital status: Widowed    Spouse name: Not on file  . Number of children: 1  . Years of education: Not on file  . Highest education level: 11th grade  Occupational History  . Occupation: retired  Tobacco Use  . Smoking status: Former Smoker    Types: Cigarettes  . Smokeless tobacco: Never Used  . Tobacco comment: Quit in 2015-2016  Substance and Sexual Activity  . Alcohol use: No  . Drug use: No  . Sexual activity: Never  Other Topics Concern  . Not on file  Social History Narrative  . Not on file   Social Determinants of Health   Financial Resource Strain:  Low Risk   . Difficulty of Paying Living Expenses: Not hard at all  Food Insecurity: No Food Insecurity  . Worried About Charity fundraiser in the Last Year: Never true  . Ran Out of Food in the Last Year: Never true  Transportation Needs: No Transportation Needs  . Lack of Transportation (Medical): No  . Lack of Transportation (Non-Medical): No  Physical Activity: Inactive  . Days of Exercise per Week: 0 days  . Minutes of Exercise per Session: 0 min  Stress: No Stress Concern Present  . Feeling of Stress : Only a little  Social Connections: Somewhat Isolated  . Frequency of Communication with Friends and Family: More than three times a week  . Frequency of Social Gatherings with Friends and Family: Once a week  . Attends Religious Services: More than 4 times per year  . Active Member of Clubs or Organizations: No  . Attends Archivist Meetings: Never  . Marital Status: Widowed    Outpatient Encounter Medications as of 05/23/2019  Medication Sig  . acetaminophen (TYLENOL) 500 MG tablet Take 1,000 mg by mouth every 6 (six) hours as needed.  [name] albuterol (PROVENTIL HFA;VENTOLIN HFA) 108 (90 Base) MCG/ACT inhaler Inhale 2 puffs into the lungs every 6 (six) hours as needed for wheezing or shortness of breath.  [name] amLODipine (NORVASC) 5 MG tablet Take 1 tablet (5 mg total) by mouth daily.  . ASPIRIN LOW DOSE 81 MG EC tablet TAKE 1 TABLET (81 MG TOTAL) BY MOUTH DAILY.  [name] Bepotastine Besilate (BEPREVE) 1.5 % SOLN Place 1 drop into both eyes daily.  . Blood Glucose Monitoring Suppl (ONE TOUCH ULTRA MINI) w/Device KIT 1 kit by Does not apply route daily.  . calcitRIOL (ROCALTROL) 0.25 MCG capsule TAKE 1 CAPSULE BY MOUTH THREE TIMES A WEEK TAKE MONDAY, WEDNESDAY AND FRIDAY  . calcitRIOL (ROCALTROL) 0.25 MCG capsule Take 0.25 mcg by mouth daily.  . carvedilol (COREG) 12.5 MG tablet TAKE 1 TABLET (12.5 MG TOTAL) BY MOUTH 2 (TWO) TIMES DAILY.  . furosemide (LASIX) 40 MG tablet TAKE 1  TABLET BY MOUTH TWICE A DAY  . Insulin Lispro Prot & Lispro (HUMALOG MIX 75/25 KWIKPEN) (75-25) 100 UNIT/ML Kwikpen Inject 4 Units into the skin 2 (two) times daily. (Patient taking differently: Inject 2 Units into the skin 2 (two) times daily. )  . Insulin Pen Needle (B-D UF III MINI PEN NEEDLES) 31G X 5 MM MISC USE TWICE A DAY AS DIRECTED  . latanoprost (XALATAN) 0.005 % ophthalmic solution Place 1 drop into the left eye at bedtime.   [name] loratadine (CLARITIN) 10 MG tablet Take 1 tablet (10 mg total) by mouth daily.  Glory Rosebush Delica Lancets 85U MISC USE AS DIRECTED  . ONETOUCH ULTRA test strip USE AS DIRECTED  . sertraline (ZOLOFT)  25 MG tablet TAKE 1 TABLET BY MOUTH EVERY DAY  . telmisartan (MICARDIS) 20 MG tablet Take 1 tablet (20 mg total) by mouth daily.  . traZODone (DESYREL) 150 MG tablet TAKE 1 TABLET (150 MG TOTAL) BY MOUTH AT BEDTIME.  [name] Vitamin D, Ergocalciferol, (DRISDOL) 50000 units CAPS capsule Take 50,000 Units by mouth every 7 (seven) days.  . brimonidine (ALPHAGAN) 0.2 % ophthalmic solution   . dorzolamide-timolol (COSOPT) 22.3-6.8 MG/ML ophthalmic solution   . quinapril (ACCUPRIL) 40 MG tablet TAKE 1 TABLET BY MOUTH EVERY DAY (Patient not taking: Reported on 05/23/2019)   No facility-administered encounter medications on file as of 05/23/2019.    Activities of Daily Living In your present state of health, do you have any difficulty performing the following activities: 05/23/2019  Hearing? N  Vision? N  Difficulty concentrating or making decisions? N  Walking or climbing stairs? Y  Comment Avoids steps completely.  Dressing or bathing? N  Doing errands, shopping? Y  Comment Does not drive.  Preparing Food and eating ? N  Using the Toilet? N  In the past six months, have you accidently leaked urine? N  Do you have problems with loss of bowel control? N  Managing your Medications? N  Managing your Finances? N  Housekeeping or managing your Housekeeping? Y  Comment  Niece helps with cleaning.  Some recent data might be hidden    Patient Care Team: Jerrol Banana., MD as PCP - General (Family Medicine) Sharlotte Alamo, DPM (Podiatry) Hayden Pedro, MD as Consulting Physician (Ophthalmology) Lavonia Dana, MD as Consulting Physician (Internal Medicine) Odette Fraction (Optometry) Cathi Roan, [facility] (Pharmacist)    Assessment:   This is a routine wellness examination for [place].  Exercise Activities and Dietary recommendations Current Exercise Habits: The patient does not participate in regular exercise at present, Exercise limited by: orthopedic condition(s)  Goals      Patient Stated   . Expensive eye drops (pt-stated)     Current Barriers:  . financial  Pharmacist Clinical Goal(s): Over the next 14 days, Lori Stanley will provide the necessary supplementary documents (proof of out of pocket prescription expenditure, proof of household income) needed for medication assistance applications to CCM pharmacist.   Interventions: . CCM pharmacist will investigate available programs or foundations for Lori Stanley eye drops  Patient Self Care Activities:  [name] Gather necessary documents needed to apply for medication assistance  Initial goal documentation      . Pill Burden (pt-stated)      Current Barriers:  . High pill burden . Limited vision abilities  Pharmacist Clinical Goal(s): Over the next 30 days, Lori Stanley will be adherent to all medications as evidenced by patient report. CCM clinical pharmacist will collaborate with prescribers to reduce medications if medically appropriate.   Interventions: . Patient educated on purpose, proper use and potential adverse effects of medications. Minette Brine Dr. [place] re: Ergocalciferol and labs . Develop plan with Dr. [name] for coming off of insulin . DC ASA 83m  . Make sure patient can read and visualize medications appropriately  Patient Self Care Activities:   . Continue to take all medications as prescribed  Initial goal documentation        Other   . DIET - INCREASE WATER INTAKE     Recommend to drink at least 6-8 8oz glasses of water per day.    . Reduce caffeine intake     Recommend to cut back on caffeine intake  to no more than 2 a day.       Fall Risk: Fall Risk  05/23/2019 03/11/2019 05/15/2018 08/28/2017 12/26/2016  Falls in the past year? 0 0 0 No Yes  Number falls in past yr: 0 0 - - 2 or more  Injury with Fall? 0 0 - - No  Risk for fall due to : - - - - History of fall(s);Impaired balance/gait  Follow up - Falls evaluation completed - - -    FALL RISK PREVENTION PERTAINING TO THE HOME:  Any stairs in or around the home? No  If so, are there any without handrails? N/A  Home free of loose throw rugs in walkways, pet beds, electrical cords, etc? Yes  Adequate lighting in your home to reduce risk of falls? Yes   ASSISTIVE DEVICES UTILIZED TO PREVENT FALLS:  Life alert? No  Use of a cane, walker or w/c? Yes  Grab bars in the bathroom? No  Shower chair or bench in shower? No  Elevated toilet seat or a handicapped toilet? Yes   TIMED UP AND GO:  Was the test performed? No .    Depression Screen PHQ 2/9 Scores 05/23/2019 05/23/2019 04/10/2019 03/11/2019  PHQ - 2 Score 1 1 0 2  PHQ- 9 Score - - 0 7     Cognitive Function     6CIT Screen 05/23/2019 05/15/2018  What Year? 0 points 0 points  What month? 0 points 0 points  What time? 0 points 0 points  Count back from 20 0 points 0 points  Months in reverse 4 points 2 points  Repeat phrase 6 points 4 points  Total Score 10 6    Immunization History  Administered Date(s) Administered  . Fluad Quad(high Dose 65+) 01/29/2019  . Influenza, High Dose Seasonal PF 02/05/2015, 01/16/2016, 12/26/2016, 02/13/2018  . Pneumococcal Conjugate-13 02/04/2014  . Pneumococcal Polysaccharide-23 04/07/2002  . Td 10/19/2016    Qualifies for Shingles Vaccine? Yes . Due for  Shingrix. Pt has been advised to call insurance company to determine out of pocket expense. Advised may also receive vaccine at local pharmacy or Health Dept. Verbalized acceptance and understanding.  Tdap: Up to date  Flu Vaccine: Up to date  Pneumococcal Vaccine: Completed series  Screening Tests Health Maintenance  Topic Date Due  . FOOT EXAM  04/18/2018  . OPHTHALMOLOGY EXAM  10/04/2018  . HEMOGLOBIN A1C  10/09/2019  . TETANUS/TDAP  10/20/2026  . INFLUENZA VACCINE  Completed  . DEXA SCAN  Completed  . PNA vac Low Risk Adult  Completed    Cancer Screenings:  Colorectal Screening: No longer required.   Mammogram: No longer required.   Bone Density: Completed 12/11/03. Results reflect NORMAL. No repeat needed unless advised by a physician.   Lung Cancer Screening: (Low Dose CT Chest recommended if Age 57-80 years, 30 pack-year currently smoking OR have quit w/in 15years.) does not qualify.   Additional Screening:  Dental Screening: Recommended annual dental exams for proper oral hygiene   Community Resource Referral:  CRR required this visit?  No       Plan:  I have personally reviewed and addressed the Medicare Annual Wellness questionnaire and have noted the following in the patient's chart:  A. Medical and social history B. Use of alcohol, tobacco or illicit drugs  C. Current medications and supplements D. Functional ability and status E.  Nutritional status F.  Physical activity G. Advance directives H. List of other physicians I.  Hospitalizations, surgeries, and  ER visits in previous 12 months J.  [place] such as hearing and vision if needed, cognitive and depression L. Referrals and appointments   In addition, I have reviewed and discussed with patient certain preventive protocols, quality metrics, and best practice recommendations. A written personalized care plan for preventive services as well as general preventive health recommendations  were provided to patient.   Glendora Score, [place]  8/84/1660 Nurse Health Advisor   Nurse Notes: Pt needs a diabetic foot exam at next in office visit. Eye exam is scheduled for 06/2019.

## 2019-05-23 ENCOUNTER — Ambulatory Visit (INDEPENDENT_AMBULATORY_CARE_PROVIDER_SITE_OTHER): Payer: Medicare Other

## 2019-05-23 ENCOUNTER — Other Ambulatory Visit: Payer: Self-pay

## 2019-05-23 ENCOUNTER — Telehealth: Payer: Self-pay

## 2019-05-23 DIAGNOSIS — Z0000 Encounter for general adult medical examination without abnormal findings: Secondary | ICD-10-CM

## 2019-05-23 NOTE — Telephone Encounter (Signed)
Please advise 

## 2019-05-23 NOTE — Patient Instructions (Signed)
[name] , Thank you for taking time to come for your Medicare Wellness Visit. I appreciate your ongoing commitment to your health goals. Please review the following plan we discussed and let me know if I can assist you in the future.   Screening recommendations/referrals: Colonoscopy: No longer required.  Mammogram: No longer required.  Bone Density: Up to date. Previous DEXA scan was normal. No repeat needed unless advised by a physician. Recommended yearly ophthalmology/optometry visit for glaucoma screening and checkup Recommended yearly dental visit for hygiene and checkup  Vaccinations: Influenza vaccine: Up to date Pneumococcal vaccine: Completed series Tdap vaccine: Up to date, due 10/2026 Shingles vaccine: Pt declines today.     Advanced directives: Please bring a copy of your POA (Power of Attorney) and/or Living Will to your next appointment.   Conditions/risks identified: Recommend to cut back on caffeine intake to no more than 2 a day.  Next appointment: 07/08/19 @ 10:40 AM with Dr Lori Stanley.   Preventive Care 19 Years and Older, Female Preventive care refers to lifestyle choices and visits with your health care provider that can promote health and wellness. What does preventive care include?  A yearly physical exam. This is also called an annual well check.  Dental exams once or twice a year.  Routine eye exams. Ask your health care provider how often you should have your eyes checked.  Personal lifestyle choices, including:  Daily care of your teeth and gums.  Regular physical activity.  Eating a healthy diet.  Avoiding tobacco and drug use.  Limiting alcohol use.  Practicing safe sex.  Taking low-dose aspirin every day.  Taking vitamin and mineral supplements as recommended by your health care provider. What happens during an annual well check? The services and screenings done by your health care provider during your annual well check will depend on  your age, overall health, lifestyle risk f/actors, and family history of disease. Counseling / @ 10:40 AM with Dr Lori Stanley Your health care provider may ask you questions about your:  Alcohol use.  Tobacco use.  Drug use.  Emotional well-being.  Home and relationship well-being.  Sexual activity.  Eating habits.  History of falls.  Memory and ability to understand (cognition).  Work and work Statistician.  Reproductive health. Screening  You may have the following tests or measurements:  Height, weight, and BMI.  Blood pressure.  Lipid and cholesterol levels. These may be checked every 5 years, or more frequently if you are over 8 years old.  Skin check.  Lung cancer screening. You may have this screening every year starting at age 61 if you have a 30-pack-year history of smoking and currently smoke or have quit within the past 15 years.  Fecal occult blood test (FOBT) of the stool. You may have this test every year starting at age 52.  Flexible sigmoidoscopy or colonoscopy. You may have a sigmoidoscopy every 5 years or a colonoscopy every 10 years starting at age 77.  Hepatitis C blood test.  Hepatitis B blood test.  Sexually transmitted disease (STD) testing.  Diabetes screening. This is done by checking your blood sugar (glucose) after you have not eaten for a while (fasting). You may have this done every 1-3 years.  Bone density scan. This is done to screen for osteoporosis. You may have this done starting at age 3.  Mammogram. This may be done every 1-2 years. Talk to your health care provider about how often you should have regular mammograms. Talk with  your health care provider about your test results, treatment options, and if necessary, the need for more tests. Vaccines  Your health care provider may recommend certain vaccines, such as:  Influenza vaccine. This is recommended every year.  Tetanus, diphtheria, and acellular pertussis (Tdap, Td)  vaccine. You may need a Td booster every 10 years.  Zoster vaccine. You may need this after age 71.  Pneumococcal 13-valent conjugate (PCV13) vaccine. One dose is recommended after age 27.  Pneumococcal polysaccharide (PPSV23) vaccine. One dose is recommended after age 16. Talk to your health care provider about which screenings and vaccines you need and how often you need them. This information is not intended to replace advice given to you by your health care provider. Make sure you discuss any questions you have with your health care provider. Document Released: 05/15/2015 Document Revised: 01/06/2016 Document Reviewed: 02/17/2015 Elsevier Interactive Patient Education  2017 [place] Prevention in the Home Falls can cause injuries. They can happen to people of all ages. There are many things you can do to make your home safe and to help prevent falls. What can I do on the outside of my home?  Regularly fix the edges of walkways and driveways and fix any cracks.  Remove anything that might make you trip as you walk through a door, such as a raised step or threshold.  Trim any bushes or trees on the path to your home.  Use bright outdoor lighting.  Clear any walking paths of anything that might make someone trip, such as rocks or tools.  Regularly check to see if handrails are loose or broken. Make sure that both sides of any steps have handrails.  Any raised decks and porches should have guardrails on the edges.  Have any leaves, snow, or ice cleared regularly.  Use sand or salt on walking paths during winter.  Clean up any spills in your garage right away. This includes oil or grease spills. What can I do in the bathroom?  Use night lights.  Install grab bars by the toilet and in the tub and shower. Do not use towel bars as grab bars.  Use non-skid mats or decals in the tub or shower.  If you need to sit down in the shower, use a plastic, non-slip  stool.  Keep the floor dry. Clean up any water that spills on the floor as soon as it happens.  Remove soap buildup in the tub or shower regularly.  Attach bath mats securely with double-sided non-slip rug tape.  Do not have throw rugs and other things on the floor that can make you trip. What can I do in the bedroom?  Use night lights.  Make sure that you have a light by your bed that is easy to reach.  Do not use any sheets or blankets that are too big for your bed. They should not hang down onto the floor.  Have a firm chair that has side arms. You can use this for support while you get dressed.  Do not have throw rugs and other things on the floor that can make you trip. What can I do in the kitchen?  Clean up any spills right away.  Avoid walking on wet floors.  Keep items that you use a lot in easy-to-reach places.  If you need to reach something above you, use a strong step stool that has a grab bar.  Keep electrical cords out of the way.  Do not  use floor polish or wax that makes floors slippery. If you must use wax, use non-skid floor wax.  Do not have throw rugs and other things on the floor that can make you trip. What can I do with my stairs?  Do not leave any items on the stairs.  Make sure that there are handrails on both sides of the stairs and use them. Fix handrails that are broken or loose. Make sure that handrails are as long as the stairways.  Check any carpeting to make sure that it is firmly attached to the stairs. Fix any carpet that is loose or worn.  Avoid having throw rugs at the top or bottom of the stairs. If you do have throw rugs, attach them to the floor with carpet tape.  Make sure that you have a light switch at the top of the stairs and the bottom of the stairs. If you do not have them, ask someone to add them for you. What else can I do to help prevent falls?  Wear shoes that:  Do not have high heels.  Have rubber bottoms.  Are  comfortable and fit you well.  Are closed at the toe. Do not wear sandals.  If you use a stepladder:  Make sure that it is fully opened. Do not climb a closed stepladder.  Make sure that both sides of the stepladder are locked into place.  Ask someone to hold it for you, if possible.  Clearly mark and make sure that you can see:  Any grab bars or handrails.  First and last steps.  Where the edge of each step is.  Use tools that help you move around (mobility aids) if they are needed. These include:  Canes.  Walkers.  Scooters.  Crutches.  Turn on the lights when you go into a dark area. Replace any light bulbs as soon as they burn out.  Set up your furniture so you have a clear path. Avoid moving your furniture around.  If any of your floors are uneven, fix them.  If there are any pets around you, be aware of where they are.  Review your medicines with your doctor. Some medicines can make you feel dizzy. This can increase your chance of falling. Ask your doctor what other things that you can do to help prevent falls. This information is not intended to replace advice given to you by your health care provider. Make sure you discuss any questions you have with your health care provider. Document Released: 02/12/2009 Document Revised: 09/24/2015 Document Reviewed: 05/23/2014 Elsevier Interactive Patient Education  2017 [employer].

## 2019-05-23 NOTE — Telephone Encounter (Signed)
Spoke with pt today to complete her AWV telephonically. Pt wanted to know if she can take Mucous DM OTC in conjunction with her other medications? Pt believes she may have a cold. Her symptoms consist of a cough and chest congestion. Pt declined fever, SOB or n/v/d. Inquired about scheduling a virtual visit for this and pt declined and stated this occurs often. Please advise, thank you.

## 2019-05-24 NOTE — Telephone Encounter (Signed)
Virtual visit ok

## 2019-05-27 NOTE — Telephone Encounter (Signed)
Called to schedule patient for virtual visit, left message to call back. If patient calls back it is okay for someone else to schedule her.

## 2019-05-30 ENCOUNTER — Other Ambulatory Visit: Payer: Self-pay | Admitting: Family Medicine

## 2019-05-30 NOTE — Telephone Encounter (Signed)
albuterol (PROVENTIL HFA;VENTOLIN HFA) 108 (90 Base) MCG/ACT inhaler    Patient is requesting refill.     Pharmacy:  [id number] - [place], [place] - [address] Phone:  [phone]  Fax:  [phone]

## 2019-05-30 NOTE — Telephone Encounter (Signed)
LMOVM for pt to return call 

## 2019-05-31 ENCOUNTER — Encounter: Payer: Self-pay | Admitting: Physician Assistant

## 2019-05-31 ENCOUNTER — Ambulatory Visit: Payer: Self-pay | Admitting: *Deleted

## 2019-05-31 ENCOUNTER — Ambulatory Visit (INDEPENDENT_AMBULATORY_CARE_PROVIDER_SITE_OTHER): Payer: Medicare Other | Admitting: Physician Assistant

## 2019-05-31 DIAGNOSIS — F5101 Primary insomnia: Secondary | ICD-10-CM | POA: Diagnosis not present

## 2019-05-31 MED ORDER — DOXEPIN HCL 3 MG PO TABS: 1.0000 | ORAL_TABLET | Freq: Every day | ORAL | 0 refills | Status: DC

## 2019-05-31 NOTE — Telephone Encounter (Signed)
FYI! Patient had visit with Grace Bushy.

## 2019-05-31 NOTE — Telephone Encounter (Signed)
Patient had visit today with Grace Bushy.

## 2019-05-31 NOTE — Telephone Encounter (Signed)
Summary: feeling anxious- trouble sleeping    Patient requesting to speak with RN about "nerves". Patient would not disclose additional information except she is having trouble sleeping at night and feels nervous.      Patient does use Trazodone but not helping- she states she has had changes in the last 2-3 days- she is unable to sleep and wakes every couple hours. Reason for Disposition . [1] Symptoms of anxiety or panic AND [2] has not been evaluated for this by physician  Answer Assessment - Initial Assessment Questions 1. CONCERN: "What happened that made you call today?"     Patient is nervous 2. ANXIETY SYMPTOM SCREENING: "Can you describe how you have been feeling?"  (e.g., tense, restless, panicky, anxious, keyed up, trouble sleeping, trouble concentrating)     Not sleeping- interrupted sleep, restless 3. ONSET: "How long have you been feeling this way?"     2-3 days 4. RECURRENT: "Have you felt this way before?"  If yes: "What happened that time?" "What helped these feelings go away in the past?"      Yes- a while ago- when released from hospital 5. RISK OF HARM - SUICIDAL IDEATION:  "Do you ever have thoughts of hurting or killing yourself?"  (e.g., yes, no, no but preoccupation with thoughts about death)   - INTENT:  "Do you have thoughts of hurting or killing yourself right NOW?" (e.g., yes, no, N/A)   - PLAN: "Do you have a specific plan for how you would do this?" (e.g., gun, knife, overdose, no plan, N/A)     no 6. RISK OF HARM - HOMICIDAL IDEATION:  "Do you ever have thoughts of hurting or killing someone else?"  (e.g., yes, no, no but preoccupation with thoughts about death)   - INTENT:  "Do you have thoughts of hurting or killing someone right NOW?" (e.g., yes, no, N/A)   - PLAN: "Do you have a specific plan for how you would do this?" (e.g., gun, knife, no plan, N/A)      no 7. FUNCTIONAL IMPAIRMENT: "How have things been going for you overall in your life? Have you had  any more difficulties than usual doing your normal daily activities?"  (e.g., better, same, worse; self-care, school, work, interactions)     Publishing copy good- patient had heart failure and she is doing good with that- damp weather- sinus issues. No problems with daily activity 8. SUPPORT: "Who is with you now?" "Who do you live with?" "Do you have family or friends nearby who you can talk to?"      Lives alone- and is isolating, patient does talk to family members 10. THERAPIST: "Do you have a counselor or therapist? Name?"     no 10. STRESSORS: "Has there been any new stress or recent changes in your life?"       No changes 11. CAFFEINE ABUSE: "Do you drink caffeinated beverages, and how much each day?" (e.g., coffee, tea, colas)       No caffine 12. SUBSTANCE ABUSE: "Do you use any illegal drugs or alcohol?"       no 13. OTHER SYMPTOMS: "Do you have any other physical symptoms right now?" (e.g., chest pain, palpitations, difficulty breathing, fever)       Patient does have O2 at home - and is asking about inhaler 14. PREGNANCY: "Is there any chance you are pregnant?" "When was your last menstrual period?"       n/a  Protocols used: ANXIETY AND PANIC ATTACK-A-AH

## 2019-05-31 NOTE — Progress Notes (Signed)
Patient: Lori Stanley Female    DOB: 03-Nov-1931   86 y.o.   MRN: [id number] Visit Date: 05/31/2019  Today's Provider: Mar Daring, PA-C   Chief Complaint  Patient presents with  . Anxiety  . Insomnia   Subjective:     Virtual Visit via Telephone Note  I connected with Lori Stanley on 05/31/19 at  3:20 PM EST by telephone and verified that I am speaking with the correct person using two identifiers.  Location: Patient: Home Provider: [facility]   I discussed the limitations, risks, security and privacy concerns of performing an evaluation and management service by telephone and the availability of in person appointments. I also discussed with the patient that there may be a patient responsible charge related to this service. The patient expressed understanding and agreed to proceed.  HPI  Patient c/o nervousness. She feels that she can't sleep. She feels fidgety and is mostly at night. Reports she does good during the day. Reports over the last couple of days she has not been sleeping well. Has been on Trazodone 153m and was recently increased in December. Worked a little but not recently. Reports she just will sit and think about things she needs to do when she tries to go to sleep.   GAD 7 : Generalized Anxiety Score 05/31/2019  Nervous, Anxious, on Edge 2  Control/stop worrying 2  Worry too much - different things 1  Trouble relaxing 2  Restless 2  Easily annoyed or irritable 2  Afraid - awful might happen 0  Total GAD 7 Score 11  Anxiety Difficulty Somewhat difficult    Allergies  Allergen Reactions  . Antihistamines, Chlorpheniramine-Type   . Codeine   . Paxil [Paroxetine]     "makes her feel funny" not in a good way     Current Outpatient Medications:  .  acetaminophen (TYLENOL) 500 MG tablet, Take 1,000 mg by mouth every 6 (six) hours as needed., Disp: , Rfl:  .  albuterol (PROVENTIL HFA;VENTOLIN HFA) 108 (90 Base) MCG/ACT inhaler, Inhale 2  puffs into the lungs every 6 (six) hours as needed for wheezing or shortness of breath., Disp: 1 Inhaler, Rfl: 0 .  amLODipine (NORVASC) 5 MG tablet, Take 1 tablet (5 mg total) by mouth daily., Disp: 90 tablet, Rfl: 3 .  ASPIRIN LOW DOSE 81 MG EC tablet, TAKE 1 TABLET (81 MG TOTAL) BY MOUTH DAILY., Disp: 90 tablet, Rfl: 1 .  Bepotastine Besilate (BEPREVE) 1.5 % SOLN, Place 1 drop into both eyes daily., Disp: , Rfl:  .  Blood Glucose Monitoring Suppl (ONE TOUCH ULTRA MINI) w/Device KIT, 1 kit by Does not apply route daily., Disp: 1 kit, Rfl: 0 .  carvedilol (COREG) 12.5 MG tablet, TAKE 1 TABLET (12.5 MG TOTAL) BY MOUTH 2 (TWO) TIMES DAILY., Disp: 180 tablet, Rfl: 1 .  dorzolamide-timolol (COSOPT) 22.3-6.8 MG/ML ophthalmic solution, , Disp: , Rfl:  .  furosemide (LASIX) 40 MG tablet, TAKE 1 TABLET BY MOUTH TWICE A DAY, Disp: 180 tablet, Rfl: 1 .  Insulin Lispro Prot & Lispro (HUMALOG MIX 75/25 KWIKPEN) (75-25) 100 UNIT/ML Kwikpen, Inject 4 Units into the skin 2 (two) times daily. (Patient taking differently: Inject 2 Units into the skin 2 (two) times daily. ), Disp: 16 pen, Rfl: 12 .  Insulin Pen Needle (B-D UF III MINI PEN NEEDLES) 31G X 5 MM MISC, USE TWICE A DAY AS DIRECTED, Disp: 100 each, Rfl: 5 .  latanoprost (XALATAN)  0.005 % ophthalmic solution, Place 1 drop into the left eye at bedtime. , Disp: , Rfl:  .  loratadine (CLARITIN) 10 MG tablet, Take 1 tablet (10 mg total) by mouth daily., Disp: 90 tablet, Rfl: 3 .  OneTouch Delica Lancets 33L MISC, USE AS DIRECTED, Disp: 100 each, Rfl: 1 .  ONETOUCH ULTRA test strip, USE AS DIRECTED, Disp: 100 strip, Rfl: 5 .  sertraline (ZOLOFT) 25 MG tablet, TAKE 1 TABLET BY MOUTH EVERY DAY, Disp: 90 tablet, Rfl: 2 .  telmisartan (MICARDIS) 20 MG tablet, Take 1 tablet (20 mg total) by mouth daily., Disp: 30 tablet, Rfl: 11 .  traZODone (DESYREL) 150 MG tablet, TAKE 1 TABLET (150 MG TOTAL) BY MOUTH AT BEDTIME., Disp: 90 tablet, Rfl: 3 .  Vitamin D,  Ergocalciferol, (DRISDOL) 50000 units CAPS capsule, Take 50,000 Units by mouth every 7 (seven) days., Disp: , Rfl:  .  brimonidine (ALPHAGAN) 0.2 % ophthalmic solution, , Disp: , Rfl:  .  calcitRIOL (ROCALTROL) 0.25 MCG capsule, TAKE 1 CAPSULE BY MOUTH THREE TIMES A WEEK TAKE MONDAY, WEDNESDAY AND FRIDAY, Disp: , Rfl:  .  calcitRIOL (ROCALTROL) 0.25 MCG capsule, Take 0.25 mcg by mouth daily., Disp: , Rfl:  .  quinapril (ACCUPRIL) 40 MG tablet, TAKE 1 TABLET BY MOUTH EVERY DAY (Patient not taking: Reported on 05/23/2019), Disp: 90 tablet, Rfl: 3  Review of Systems  Constitutional: Negative.   Respiratory: Negative.   Cardiovascular: Negative.   Psychiatric/Behavioral: Positive for sleep disturbance. The patient is nervous/anxious.     Social History   Tobacco Use  . Smoking status: Former Smoker    Types: Cigarettes  . Smokeless tobacco: Never Used  . Tobacco comment: Quit in 2015-2016  Substance Use Topics  . Alcohol use: No      Objective:   There were no vitals taken for this visit. There were no vitals filed for this visit.There is no height or weight on file to calculate BMI.   Physical Exam Vitals reviewed.  Constitutional:      General: She is not in acute distress. Pulmonary:     Effort: No respiratory distress.  Neurological:     Mental Status: She is alert.  Psychiatric:        Mood and Affect: Mood normal.        Thought Content: Thought content normal.      No results found for any visits on 05/31/19.     Assessment & Plan     1. Primary insomnia Will stop Trazodone and change to Doxepin as below to see if this helps her sleep better. She was advised to start with one tab, but may increase to 2 tabs if not working. If 2 tabs is ineffective she needs to follow up.  - Doxepin HCl 3 MG TABS; Take 1 tablet (3 mg total) by mouth at bedtime.  Dispense: 30 tablet; Refill: 0   I discussed the assessment and treatment plan with the patient. The patient was  provided an opportunity to ask questions and all were answered. The patient agreed with the plan and demonstrated an understanding of the instructions.   The patient was advised to call back or seek an in-person evaluation if the symptoms worsen or if the condition fails to improve as anticipated.  I provided 14 minutes of non-face-to-face time during this encounter.    Mar Daring, PA-C  [facility]

## 2019-06-02 ENCOUNTER — Other Ambulatory Visit: Payer: Self-pay | Admitting: Family Medicine

## 2019-06-03 ENCOUNTER — Ambulatory Visit: Payer: Self-pay

## 2019-06-03 NOTE — Telephone Encounter (Signed)
Pt c/o 1 week h/o SOB with activity. Pt stated that she is having increased swelling to her ankles and feet. Pt stated she feels anxious and she thinks that is why she is SOB with walking. Pt denies chest pain, sweating or cough or fever. Pt stated denies any other new sx. Called office and was offered a phone appt Wednesday. Pt agreeable. Advised pt to call 911 if begins to have SOB at rest, chest pain or respiratory difficulty. Pt verbalized understanding. Reason for Disposition . [1] MILD longstanding difficulty breathing AND [2]  SAME as normal  Answer Assessment - Initial Assessment Questions 1. RESPIRATORY STATUS: "Describe your breathing?" (e.g., wheezing, shortness of breath, unable to speak, severe coughing)      Shortness of breath with activity, feels nervous  2. ONSET: "When did this breathing problem begin?"     1 week ago 3. PATTERN "Does the difficult breathing come and go, or has it been constant since it started?"      SOB that comes and goes with activity 4. SEVERITY: "How bad is your breathing?" (e.g., mild, moderate, severe)    - MILD: No SOB at rest, mild SOB with walking, speaks normally in sentences, can lay down, no retractions, pulse < 100.    - MODERATE: SOB at rest, SOB with minimal exertion and prefers to sit, cannot lie down flat, speaks in phrases, mild retractions, audible wheezing, pulse 100-120.    - SEVERE: Very SOB at rest, speaks in single words, struggling to breathe, sitting hunched forward, retractions, pulse > 120     mild 5. RECURRENT SYMPTOM: "Have you had difficulty breathing before?" If so, ask: "When was the last time?" and "What happened that time?"      no 6. CARDIAC HISTORY: "Do you have any history of heart disease?" (e.g., heart attack, angina, bypass surgery, angioplasty)      no 7. LUNG HISTORY: "Do you have any history of lung disease?"  (e.g., pulmonary embolus, asthma, emphysema)     no 8. CAUSE: "What do you think is causing the breathing  problem?"      Pt doesn't know 9. OTHER SYMPTOMS: "Do you have any other symptoms? (e.g., dizziness, runny nose, cough, chest pain, fever)     No- legs and feet are more swollen 10. PREGNANCY: "Is there any chance you are pregnant?" "When was your last menstrual period?"       n/a 11. TRAVEL: "Have you traveled out of the country in the last month?" (e.g., travel history, exposures)       no  Protocols used: BREATHING DIFFICULTY-A-AH

## 2019-06-04 DIAGNOSIS — I110 Hypertensive heart disease with heart failure: Secondary | ICD-10-CM | POA: Diagnosis not present

## 2019-06-04 DIAGNOSIS — I509 Heart failure, unspecified: Secondary | ICD-10-CM | POA: Diagnosis not present

## 2019-06-04 DIAGNOSIS — I469 Cardiac arrest, cause unspecified: Secondary | ICD-10-CM | POA: Diagnosis not present

## 2019-06-04 DIAGNOSIS — J9601 Acute respiratory failure with hypoxia: Secondary | ICD-10-CM | POA: Diagnosis not present

## 2019-06-05 ENCOUNTER — Emergency Department: Payer: Medicare Other

## 2019-06-05 ENCOUNTER — Inpatient Hospital Stay: Payer: Medicare Other

## 2019-06-05 ENCOUNTER — Ambulatory Visit (INDEPENDENT_AMBULATORY_CARE_PROVIDER_SITE_OTHER): Payer: Medicare Other | Admitting: Family Medicine

## 2019-06-05 ENCOUNTER — Encounter: Payer: Self-pay | Admitting: Emergency Medicine

## 2019-06-05 ENCOUNTER — Other Ambulatory Visit: Payer: Self-pay

## 2019-06-05 ENCOUNTER — Inpatient Hospital Stay
Admission: EM | Admit: 2019-06-05 | Discharge: 2019-06-17 | DRG: 291 | Disposition: A | Discharge status: Home / Self Care | Payer: Medicare Other | Attending: Internal Medicine | Admitting: Internal Medicine

## 2019-06-05 ENCOUNTER — Inpatient Hospital Stay (HOSPITAL_COMMUNITY)
Admit: 2019-06-05 | Discharge: 2019-06-05 | Disposition: A | Discharge status: Home / Self Care | Payer: Medicare Other | Attending: Physician Assistant | Admitting: Physician Assistant

## 2019-06-05 DIAGNOSIS — I469 Cardiac arrest, cause unspecified: Secondary | ICD-10-CM | POA: Diagnosis not present

## 2019-06-05 DIAGNOSIS — I10 Essential (primary) hypertension: Secondary | ICD-10-CM | POA: Diagnosis not present

## 2019-06-05 DIAGNOSIS — Z6827 Body mass index (BMI) 27.0-27.9, adult: Secondary | ICD-10-CM

## 2019-06-05 DIAGNOSIS — Z992 Dependence on renal dialysis: Secondary | ICD-10-CM

## 2019-06-05 DIAGNOSIS — Z888 Allergy status to other drugs, medicaments and biological substances status: Secondary | ICD-10-CM

## 2019-06-05 DIAGNOSIS — Z8249 Family history of ischemic heart disease and other diseases of the circulatory system: Secondary | ICD-10-CM

## 2019-06-05 DIAGNOSIS — N185 Chronic kidney disease, stage 5: Secondary | ICD-10-CM

## 2019-06-05 DIAGNOSIS — M545 Low back pain: Secondary | ICD-10-CM | POA: Diagnosis present

## 2019-06-05 DIAGNOSIS — N186 End stage renal disease: Secondary | ICD-10-CM | POA: Diagnosis not present

## 2019-06-05 DIAGNOSIS — R062 Wheezing: Secondary | ICD-10-CM | POA: Diagnosis not present

## 2019-06-05 DIAGNOSIS — E785 Hyperlipidemia, unspecified: Secondary | ICD-10-CM | POA: Diagnosis not present

## 2019-06-05 DIAGNOSIS — E872 Acidosis: Secondary | ICD-10-CM | POA: Diagnosis present

## 2019-06-05 DIAGNOSIS — E1122 Type 2 diabetes mellitus with diabetic chronic kidney disease: Secondary | ICD-10-CM

## 2019-06-05 DIAGNOSIS — R319 Hematuria, unspecified: Secondary | ICD-10-CM | POA: Diagnosis present

## 2019-06-05 DIAGNOSIS — E119 Type 2 diabetes mellitus without complications: Secondary | ICD-10-CM

## 2019-06-05 DIAGNOSIS — E876 Hypokalemia: Secondary | ICD-10-CM | POA: Diagnosis not present

## 2019-06-05 DIAGNOSIS — I120 Hypertensive chronic kidney disease with stage 5 chronic kidney disease or end stage renal disease: Secondary | ICD-10-CM | POA: Diagnosis not present

## 2019-06-05 DIAGNOSIS — D631 Anemia in chronic kidney disease: Secondary | ICD-10-CM | POA: Diagnosis not present

## 2019-06-05 DIAGNOSIS — J9621 Acute and chronic respiratory failure with hypoxia: Secondary | ICD-10-CM | POA: Diagnosis present

## 2019-06-05 DIAGNOSIS — F329 Major depressive disorder, single episode, unspecified: Secondary | ICD-10-CM | POA: Diagnosis present

## 2019-06-05 DIAGNOSIS — I132 Hypertensive heart and chronic kidney disease with heart failure and with stage 5 chronic kidney disease, or end stage renal disease: Secondary | ICD-10-CM | POA: Diagnosis not present

## 2019-06-05 DIAGNOSIS — I252 Old myocardial infarction: Secondary | ICD-10-CM | POA: Diagnosis not present

## 2019-06-05 DIAGNOSIS — I509 Heart failure, unspecified: Secondary | ICD-10-CM

## 2019-06-05 DIAGNOSIS — R809 Proteinuria, unspecified: Secondary | ICD-10-CM | POA: Diagnosis not present

## 2019-06-05 DIAGNOSIS — F419 Anxiety disorder, unspecified: Secondary | ICD-10-CM | POA: Diagnosis present

## 2019-06-05 DIAGNOSIS — K219 Gastro-esophageal reflux disease without esophagitis: Secondary | ICD-10-CM | POA: Diagnosis present

## 2019-06-05 DIAGNOSIS — Z79899 Other long term (current) drug therapy: Secondary | ICD-10-CM | POA: Diagnosis not present

## 2019-06-05 DIAGNOSIS — I5033 Acute on chronic diastolic (congestive) heart failure: Secondary | ICD-10-CM

## 2019-06-05 DIAGNOSIS — N179 Acute kidney failure, unspecified: Secondary | ICD-10-CM | POA: Diagnosis not present

## 2019-06-05 DIAGNOSIS — G9340 Encephalopathy, unspecified: Secondary | ICD-10-CM | POA: Diagnosis not present

## 2019-06-05 DIAGNOSIS — H409 Unspecified glaucoma: Secondary | ICD-10-CM | POA: Diagnosis present

## 2019-06-05 DIAGNOSIS — J9600 Acute respiratory failure, unspecified whether with hypoxia or hypercapnia: Secondary | ICD-10-CM

## 2019-06-05 DIAGNOSIS — R600 Localized edema: Secondary | ICD-10-CM

## 2019-06-05 DIAGNOSIS — I5032 Chronic diastolic (congestive) heart failure: Secondary | ICD-10-CM

## 2019-06-05 DIAGNOSIS — J984 Other disorders of lung: Secondary | ICD-10-CM

## 2019-06-05 DIAGNOSIS — Z794 Long term (current) use of insulin: Secondary | ICD-10-CM

## 2019-06-05 DIAGNOSIS — D649 Anemia, unspecified: Secondary | ICD-10-CM | POA: Diagnosis not present

## 2019-06-05 DIAGNOSIS — N2581 Secondary hyperparathyroidism of renal origin: Secondary | ICD-10-CM | POA: Diagnosis present

## 2019-06-05 DIAGNOSIS — D638 Anemia in other chronic diseases classified elsewhere: Secondary | ICD-10-CM

## 2019-06-05 DIAGNOSIS — G8929 Other chronic pain: Secondary | ICD-10-CM | POA: Diagnosis present

## 2019-06-05 DIAGNOSIS — Z87891 Personal history of nicotine dependence: Secondary | ICD-10-CM

## 2019-06-05 DIAGNOSIS — J9601 Acute respiratory failure with hypoxia: Secondary | ICD-10-CM

## 2019-06-05 DIAGNOSIS — R092 Respiratory arrest: Secondary | ICD-10-CM | POA: Diagnosis not present

## 2019-06-05 DIAGNOSIS — E1169 Type 2 diabetes mellitus with other specified complication: Secondary | ICD-10-CM | POA: Diagnosis not present

## 2019-06-05 DIAGNOSIS — N184 Chronic kidney disease, stage 4 (severe): Secondary | ICD-10-CM | POA: Diagnosis not present

## 2019-06-05 DIAGNOSIS — E538 Deficiency of other specified B group vitamins: Secondary | ICD-10-CM | POA: Diagnosis present

## 2019-06-05 DIAGNOSIS — R0602 Shortness of breath: Secondary | ICD-10-CM | POA: Diagnosis not present

## 2019-06-05 DIAGNOSIS — I340 Nonrheumatic mitral (valve) insufficiency: Secondary | ICD-10-CM | POA: Diagnosis present

## 2019-06-05 DIAGNOSIS — Z7982 Long term (current) use of aspirin: Secondary | ICD-10-CM

## 2019-06-05 DIAGNOSIS — J9602 Acute respiratory failure with hypercapnia: Secondary | ICD-10-CM | POA: Diagnosis not present

## 2019-06-05 DIAGNOSIS — J90 Pleural effusion, not elsewhere classified: Secondary | ICD-10-CM | POA: Diagnosis not present

## 2019-06-05 DIAGNOSIS — I110 Hypertensive heart disease with heart failure: Secondary | ICD-10-CM | POA: Diagnosis not present

## 2019-06-05 DIAGNOSIS — N189 Chronic kidney disease, unspecified: Secondary | ICD-10-CM | POA: Diagnosis not present

## 2019-06-05 DIAGNOSIS — E669 Obesity, unspecified: Secondary | ICD-10-CM | POA: Diagnosis present

## 2019-06-05 DIAGNOSIS — Z4659 Encounter for fitting and adjustment of other gastrointestinal appliance and device: Secondary | ICD-10-CM

## 2019-06-05 DIAGNOSIS — R069 Unspecified abnormalities of breathing: Secondary | ICD-10-CM | POA: Diagnosis not present

## 2019-06-05 DIAGNOSIS — Z885 Allergy status to narcotic agent status: Secondary | ICD-10-CM

## 2019-06-05 DIAGNOSIS — Z20822 Contact with and (suspected) exposure to covid-19: Secondary | ICD-10-CM | POA: Diagnosis present

## 2019-06-05 DIAGNOSIS — I5031 Acute diastolic (congestive) heart failure: Secondary | ICD-10-CM | POA: Diagnosis not present

## 2019-06-05 DIAGNOSIS — Z809 Family history of malignant neoplasm, unspecified: Secondary | ICD-10-CM

## 2019-06-05 DIAGNOSIS — R0603 Acute respiratory distress: Secondary | ICD-10-CM | POA: Diagnosis not present

## 2019-06-05 DIAGNOSIS — J9622 Acute and chronic respiratory failure with hypercapnia: Secondary | ICD-10-CM | POA: Diagnosis present

## 2019-06-05 DIAGNOSIS — R0902 Hypoxemia: Secondary | ICD-10-CM | POA: Diagnosis not present

## 2019-06-05 DIAGNOSIS — J449 Chronic obstructive pulmonary disease, unspecified: Secondary | ICD-10-CM | POA: Diagnosis not present

## 2019-06-05 DIAGNOSIS — I2510 Atherosclerotic heart disease of native coronary artery without angina pectoris: Secondary | ICD-10-CM | POA: Diagnosis present

## 2019-06-05 DIAGNOSIS — Z4682 Encounter for fitting and adjustment of non-vascular catheter: Secondary | ICD-10-CM | POA: Diagnosis not present

## 2019-06-05 DIAGNOSIS — Z743 Need for continuous supervision: Secondary | ICD-10-CM | POA: Diagnosis not present

## 2019-06-05 HISTORY — DX: Acute on chronic diastolic (congestive) heart failure: I50.33

## 2019-06-05 LAB — GLUCOSE, CAPILLARY
Glucose-Capillary: 92 mg/dL (ref 70–99)
Glucose-Capillary: 133 mg/dL — ABNORMAL HIGH (ref 70–99)
Glucose-Capillary: 98 mg/dL (ref 70–99)
Glucose-Capillary: 174 mg/dL — ABNORMAL HIGH (ref 70–99)

## 2019-06-05 LAB — BASIC METABOLIC PANEL
Anion gap: 9 (ref 5–15)
BUN: 61 mg/dL — ABNORMAL HIGH (ref 8–23)
CO2: 25 mmol/L (ref 22–32)
Calcium: 9.2 mg/dL (ref 8.9–10.3)
Chloride: 102 mmol/L (ref 98–111)
Creatinine, Ser: 3.86 mg/dL — ABNORMAL HIGH (ref 0.44–1.00)
GFR calc Af Amer: 12 mL/min — ABNORMAL LOW (ref >60)
GFR calc non Af Amer: 10 mL/min — ABNORMAL LOW (ref >60)
Glucose, Bld: 150 mg/dL — ABNORMAL HIGH (ref 70–99)
Potassium: 4.7 mmol/L (ref 3.5–5.1)
Sodium: 136 mmol/L (ref 135–145)

## 2019-06-05 LAB — CBC
HCT: 26.3 % — ABNORMAL LOW (ref 36.0–46.0)
Hemoglobin: 8.1 g/dL — ABNORMAL LOW (ref 12.0–15.0)
MCH: 26.8 pg (ref 26.0–34.0)
MCHC: 30.8 g/dL (ref 30.0–36.0)
MCV: 87.1 fL (ref 80.0–100.0)
Platelets: 266 10*3/uL (ref 150–400)
RBC: 3.02 MIL/uL — ABNORMAL LOW (ref 3.87–5.11)
RDW: 14.4 % (ref 11.5–15.5)
WBC: 7.1 10*3/uL (ref 4.0–10.5)
nRBC: 0 % (ref 0.0–0.2)

## 2019-06-05 LAB — HEPATIC FUNCTION PANEL
ALT: 19 U/L (ref 0–44)
AST: 26 U/L (ref 15–41)
Albumin: 3.6 g/dL (ref 3.5–5.0)
Alkaline Phosphatase: 69 U/L (ref 38–126)
Bilirubin, Direct: 0.1 mg/dL (ref 0.0–0.2)
Indirect Bilirubin: 0.5 mg/dL (ref 0.3–0.9)
Total Bilirubin: 0.6 mg/dL (ref 0.3–1.2)
Total Protein: 6.7 g/dL (ref 6.5–8.1)

## 2019-06-05 LAB — CBC WITH DIFFERENTIAL/PLATELET
Abs Immature Granulocytes: 0.02 10*3/uL (ref 0.00–0.07)
Basophils Absolute: 0 10*3/uL (ref 0.0–0.1)
Basophils Relative: 0 %
Eosinophils Absolute: 0 10*3/uL (ref 0.0–0.5)
Eosinophils Relative: 0 %
HCT: 23.6 % — ABNORMAL LOW (ref 36.0–46.0)
Hemoglobin: 7.1 g/dL — ABNORMAL LOW (ref 12.0–15.0)
Immature Granulocytes: 0 %
Lymphocytes Relative: 14 %
Lymphs Abs: 0.7 10*3/uL (ref 0.7–4.0)
MCH: 26.7 pg (ref 26.0–34.0)
MCHC: 30.1 g/dL (ref 30.0–36.0)
MCV: 88.7 fL (ref 80.0–100.0)
Monocytes Absolute: 0.3 10*3/uL (ref 0.1–1.0)
Monocytes Relative: 5 %
Neutro Abs: 4.3 10*3/uL (ref 1.7–7.7)
Neutrophils Relative %: 81 %
Platelets: 210 10*3/uL (ref 150–400)
RBC: 2.66 MIL/uL — ABNORMAL LOW (ref 3.87–5.11)
RDW: 14.6 % (ref 11.5–15.5)
WBC: 5.3 10*3/uL (ref 4.0–10.5)
nRBC: 0 % (ref 0.0–0.2)

## 2019-06-05 LAB — IRON AND TIBC
Iron: 40 ug/dL (ref 28–170)
Saturation Ratios: 12 % (ref 10.4–31.8)
TIBC: 322 ug/dL (ref 250–450)
UIBC: 282 ug/dL

## 2019-06-05 LAB — RESPIRATORY PANEL BY RT PCR (FLU A&B, COVID)
Influenza A by PCR: NEGATIVE
Influenza B by PCR: NEGATIVE
SARS Coronavirus 2 by RT PCR: NEGATIVE

## 2019-06-05 LAB — BLOOD GAS, ARTERIAL
Acid-base deficit: 2.1 mmol/L — ABNORMAL HIGH (ref 0.0–2.0)
Bicarbonate: 26 mmol/L (ref 20.0–28.0)
O2 Saturation: 86.3 %
Patient temperature: 37
pCO2 arterial: 58 mmHg — ABNORMAL HIGH (ref 32.0–48.0)
pH, Arterial: 7.26 — ABNORMAL LOW (ref 7.350–7.450)
pO2, Arterial: 60 mmHg — ABNORMAL LOW (ref 83.0–108.0)

## 2019-06-05 LAB — TYPE AND SCREEN
ABO/RH(D): O POS
Antibody Screen: NEGATIVE

## 2019-06-05 LAB — TSH: TSH: 3.498 u[IU]/mL (ref 0.350–4.500)

## 2019-06-05 LAB — TROPONIN I (HIGH SENSITIVITY)
Troponin I (High Sensitivity): 3 ng/L (ref <18)
Troponin I (High Sensitivity): 4 ng/L (ref <18)

## 2019-06-05 LAB — FOLATE: Folate: 9 ng/mL (ref >5.9)

## 2019-06-05 LAB — BRAIN NATRIURETIC PEPTIDE: B Natriuretic Peptide: 577 pg/mL — ABNORMAL HIGH (ref 0.0–100.0)

## 2019-06-05 LAB — VITAMIN B12: Vitamin B-12: 161 pg/mL — ABNORMAL LOW (ref 180–914)

## 2019-06-05 MED ORDER — ASPIRIN EC 81 MG PO TBEC
81.0000 mg | DELAYED_RELEASE_TABLET | Freq: Every day | ORAL | Status: DC
  Administered 2019-06-05 – 2019-06-06 (×2): 81 mg via ORAL
  Filled 2019-06-05 (×2): qty 1

## 2019-06-05 MED ORDER — OLOPATADINE HCL 0.1 % OP SOLN
1.0000 [drp] | Freq: Two times a day (BID) | OPHTHALMIC | Status: DC
  Administered 2019-06-05 – 2019-06-17 (×24): 1 [drp] via OPHTHALMIC
  Filled 2019-06-05 (×2): qty 5

## 2019-06-05 MED ORDER — ACETAMINOPHEN 325 MG PO TABS: 650.0000 mg | ORAL_TABLET | ORAL | Status: DC | PRN

## 2019-06-05 MED ORDER — AMLODIPINE BESYLATE 5 MG PO TABS
5.0000 mg | ORAL_TABLET | Freq: Every day | ORAL | Status: DC
  Administered 2019-06-05: 11:00:00 5 mg via ORAL
  Filled 2019-06-05: qty 1

## 2019-06-05 MED ORDER — CARVEDILOL 12.5 MG PO TABS
12.5000 mg | ORAL_TABLET | Freq: Two times a day (BID) | ORAL | Status: DC
  Administered 2019-06-05 – 2019-06-06 (×3): 12.5 mg via ORAL
  Filled 2019-06-05: qty 2
  Filled 2019-06-05 (×2): qty 1

## 2019-06-05 MED ORDER — INSULIN ASPART 100 UNIT/ML ~~LOC~~ SOLN
0.0000 [IU] | Freq: Three times a day (TID) | SUBCUTANEOUS | Status: DC
  Administered 2019-06-06: 13:00:00 3 [IU] via SUBCUTANEOUS
  Administered 2019-06-06: 1 [IU] via SUBCUTANEOUS
  Filled 2019-06-05 (×2): qty 1

## 2019-06-05 MED ORDER — SODIUM CHLORIDE 0.9% FLUSH
3.0000 mL | Freq: Two times a day (BID) | INTRAVENOUS | Status: DC
  Administered 2019-06-05 – 2019-06-16 (×20): 3 mL via INTRAVENOUS

## 2019-06-05 MED ORDER — DOXEPIN HCL 10 MG PO CAPS
10.0000 mg | ORAL_CAPSULE | Freq: Every day | ORAL | Status: DC
  Filled 2019-06-05: qty 1

## 2019-06-05 MED ORDER — CALCITRIOL 0.25 MCG PO CAPS
0.2500 ug | ORAL_CAPSULE | ORAL | Status: DC
  Administered 2019-06-05: 11:00:00 0.25 ug via ORAL
  Filled 2019-06-05: qty 1

## 2019-06-05 MED ORDER — LATANOPROST 0.005 % OP SOLN
1.0000 [drp] | Freq: Every day | OPHTHALMIC | Status: DC
  Administered 2019-06-07 – 2019-06-16 (×10): 1 [drp] via OPHTHALMIC
  Filled 2019-06-05 (×3): qty 2.5

## 2019-06-05 MED ORDER — HEPARIN SODIUM (PORCINE) 5000 UNIT/ML IJ SOLN
5000.0000 [IU] | Freq: Three times a day (TID) | INTRAMUSCULAR | Status: DC
  Administered 2019-06-05 – 2019-06-08 (×9): 5000 [IU] via SUBCUTANEOUS
  Filled 2019-06-05 (×8): qty 1

## 2019-06-05 MED ORDER — IPRATROPIUM-ALBUTEROL 0.5-2.5 (3) MG/3ML IN SOLN
3.0000 mL | Freq: Four times a day (QID) | RESPIRATORY_TRACT | Status: DC
  Administered 2019-06-05 – 2019-06-14 (×31): 3 mL via RESPIRATORY_TRACT
  Filled 2019-06-05 (×33): qty 3

## 2019-06-05 MED ORDER — FUROSEMIDE 10 MG/ML IJ SOLN
40.0000 mg | Freq: Once | INTRAMUSCULAR | Status: AC
  Administered 2019-06-05: 01:00:00 40 mg via INTRAVENOUS
  Filled 2019-06-05: qty 4

## 2019-06-05 MED ORDER — SERTRALINE HCL 50 MG PO TABS
25.0000 mg | ORAL_TABLET | Freq: Every day | ORAL | Status: DC
  Administered 2019-06-05 – 2019-06-06 (×2): 25 mg via ORAL
  Filled 2019-06-05 (×2): qty 1

## 2019-06-05 MED ORDER — FUROSEMIDE 10 MG/ML IJ SOLN
40.0000 mg | Freq: Two times a day (BID) | INTRAMUSCULAR | Status: DC
  Administered 2019-06-05: 40 mg via INTRAVENOUS
  Filled 2019-06-05: qty 4

## 2019-06-05 MED ORDER — FUROSEMIDE 10 MG/ML IJ SOLN
10.0000 mg/h | INTRAVENOUS | Status: DC
  Filled 2019-06-05: qty 25

## 2019-06-05 MED ORDER — VITAMIN D (ERGOCALCIFEROL) 1.25 MG (50000 UNIT) PO CAPS: 50000.0000 [IU] | ORAL_CAPSULE | ORAL | Status: DC

## 2019-06-05 MED ORDER — ONDANSETRON HCL 4 MG/2ML IJ SOLN: 4.0000 mg | Freq: Four times a day (QID) | INTRAMUSCULAR | Status: DC | PRN

## 2019-06-05 MED ORDER — ENOXAPARIN SODIUM 40 MG/0.4ML ~~LOC~~ SOLN: 40.0000 mg | SUBCUTANEOUS | Status: DC

## 2019-06-05 MED ORDER — BRIMONIDINE TARTRATE 0.2 % OP SOLN
1.0000 [drp] | Freq: Two times a day (BID) | OPHTHALMIC | Status: DC
  Administered 2019-06-05 – 2019-06-17 (×24): 1 [drp] via OPHTHALMIC
  Filled 2019-06-05 (×2): qty 5

## 2019-06-05 MED ORDER — SODIUM CHLORIDE 0.9% FLUSH: 3.0000 mL | INTRAVENOUS | Status: DC | PRN

## 2019-06-05 MED ORDER — ASPIRIN EC 81 MG PO TBEC: 81.0000 mg | DELAYED_RELEASE_TABLET | Freq: Every day | ORAL | Status: DC

## 2019-06-05 MED ORDER — CALCITRIOL 0.25 MCG PO CAPS
0.2500 ug | ORAL_CAPSULE | Freq: Every day | ORAL | Status: DC
  Administered 2019-06-05 – 2019-06-06 (×2): 0.25 ug via ORAL
  Filled 2019-06-05 (×2): qty 1

## 2019-06-05 MED ORDER — HYDROXYZINE HCL 10 MG PO TABS
10.0000 mg | ORAL_TABLET | Freq: Every day | ORAL | Status: DC | PRN
  Administered 2019-06-06: 10 mg via ORAL
  Filled 2019-06-05 (×2): qty 1

## 2019-06-05 MED ORDER — SODIUM CHLORIDE 0.9 % IV SOLN
250.0000 mL | INTRAVENOUS | Status: DC | PRN
  Administered 2019-06-13 – 2019-06-14 (×2): 250 mL via INTRAVENOUS

## 2019-06-05 MED ORDER — INSULIN ASPART 100 UNIT/ML ~~LOC~~ SOLN
0.0000 [IU] | Freq: Three times a day (TID) | SUBCUTANEOUS | Status: DC
  Administered 2019-06-05: 13:00:00 1 [IU] via SUBCUTANEOUS
  Filled 2019-06-05: qty 1

## 2019-06-05 MED ORDER — IRBESARTAN 75 MG PO TABS: 75.0000 mg | ORAL_TABLET | Freq: Every day | ORAL | Status: DC

## 2019-06-05 MED ORDER — DORZOLAMIDE HCL-TIMOLOL MAL 2-0.5 % OP SOLN
1.0000 [drp] | Freq: Two times a day (BID) | OPHTHALMIC | Status: DC
  Administered 2019-06-05 – 2019-06-17 (×24): 1 [drp] via OPHTHALMIC
  Filled 2019-06-05 (×2): qty 10

## 2019-06-05 MED ORDER — ONETOUCH ULTRA MINI W/DEVICE KIT: 1.0000 | PACK | Freq: Every day | Status: DC

## 2019-06-05 MED ORDER — INSULIN LISPRO PROT & LISPRO (75-25 MIX) 100 UNIT/ML KWIKPEN: 2.0000 [IU] | PEN_INJECTOR | Freq: Two times a day (BID) | SUBCUTANEOUS | Status: DC

## 2019-06-05 NOTE — ED Provider Notes (Signed)
[facility] Emergency Department Provider Note  ____________________________________________  Time seen: Approximately 2:10 AM  I have reviewed the triage vital signs and the nursing notes.   HISTORY  Chief Complaint Shortness of Breath   HPI Lori Stanley is a 86 y.o. female history of CHF, diabetes, hypertension, hyperlipidemia, chronic kidney disease, CAD who presents for evaluation of shortness of breath.  Patient reports progressively worsening swelling of her lower extremities and shortness of breath over the last week.  Her symptoms became severe this evening.  When EMS arrived patient was satting in the mid 55s on room air and extremely hypertensive for which she was placed on NRB and given nitro.  Patient reports compliance with her diuretics.  Has been making urine normally.  Has not had an echo since 2018 which showed normal EF at that time.  She denies chest pain, cough, fever, chills, vomiting or diarrhea.  She is complaining of severe constant shortness of breath for the last few days.   Past Medical History:  Diagnosis Date  . Acute heart failure ([place])   . Chronic low back pain   . Diabetes mellitus without complication ([place])   . Hyperlipidemia   . Hypertension   . MI (myocardial infarction) ([place])   . Obesity     Patient Active Problem List   Diagnosis Date Noted  . Acute CHF (congestive heart failure) ([place]) 06/05/2019  . Anemia in chronic kidney disease 01/23/2019  . Benign hypertensive kidney disease with chronic kidney disease 01/23/2019  . Chronic kidney disease (CKD), stage V ([place]) 01/23/2019  . Proteinuria 01/23/2019  . Secondary hyperparathyroidism of renal origin ([place]) 01/23/2019  . Respiratory failure ([place]) 04/09/2017  . Syncopal episodes 12/15/2015  . Absolute anemia 04/14/2015  . Anxiety 04/14/2015  . Clinical depression 04/14/2015  . Essential (primary) hypertension 04/14/2015  . Gastro-esophageal reflux disease  without esophagitis 04/14/2015  . Arthritis, degenerative 04/14/2015  . Allergic rhinitis 04/14/2015  . Type 2 diabetes mellitus with diabetic chronic kidney disease ([place]) 04/14/2015  . Hyperlipidemia 02/27/2015  . Glaucoma 12/23/2014  . Diabetes ([place]) 12/23/2014  . GERD (gastroesophageal reflux disease) 12/23/2014  . Essential hypertension, malignant 05/24/2013  . SOB (shortness of breath) 05/24/2013  . Chronic diastolic heart failure ([place]) 05/24/2013    Past Surgical History:  Procedure Laterality Date  . EYE SURGERY    . KNEE SURGERY    . THYROID SURGERY    . VAGINAL HYSTERECTOMY      Prior to Admission medications   Medication Sig Start Date End Date Taking? Authorizing Provider  acetaminophen (TYLENOL) 500 MG tablet Take 1,000 mg by mouth every 6 (six) hours as needed.   Yes [provider]  albuterol (PROVENTIL HFA;VENTOLIN HFA) 108 (90 Base) MCG/ACT inhaler Inhale 2 puffs into the lungs every 6 (six) hours as needed for wheezing or shortness of breath. 04/13/17  Yes Wieting, Richard, MD  amLODipine (NORVASC) 5 MG tablet Take 1 tablet (5 mg total) by mouth daily. 11/05/18  Yes Jerrol Banana., MD  brimonidine [facility]) 0.2 % ophthalmic solution 1 drop 2 (two) times daily. To affected eye(s) 03/06/19  Yes [provider]  calcitRIOL (ROCALTROL) 0.25 MCG capsule Take 0.25 mcg by mouth every Monday, Wednesday, and Friday.  10/23/18  Yes [provider]  carvedilol (COREG) 12.5 MG tablet TAKE 1 TABLET (12.5 MG TOTAL) BY MOUTH 2 (TWO) TIMES DAILY. 06/02/19  Yes Jerrol Banana., MD  dorzolamide-timolol (COSOPT) 22.3-6.8 MG/ML ophthalmic solution Place  1 drop into both eyes 2 (two) times daily.  03/06/19  Yes [provider]  Doxepin HCl 3 MG TABS Take 1 tablet (3 mg total) by mouth at bedtime. 05/31/19  Yes Mar Daring, PA-C  furosemide (LASIX) 40 MG tablet TAKE 1 TABLET BY MOUTH TWICE A DAY 04/19/19  Yes Jerrol Banana., MD    Insulin Lispro Prot & Lispro (HUMALOG MIX 75/25 KWIKPEN) (75-25) 100 UNIT/ML Kwikpen Inject 4 Units into the skin 2 (two) times daily. Patient taking differently: Inject 2 Units into the skin 2 (two) times daily.  07/03/18  Yes Jerrol Banana., MD  latanoprost (XALATAN) 0.005 % ophthalmic solution Place 1 drop into the left eye at bedtime.  02/11/15  Yes [provider]  sertraline (ZOLOFT) 25 MG tablet TAKE 1 TABLET BY MOUTH EVERY DAY 04/02/19  Yes Jerrol Banana., MD  telmisartan (MICARDIS) 20 MG tablet TAKE 1 TABLET BY MOUTH EVERY DAY 06/02/19  Yes Jerrol Banana., MD  Vitamin D, Ergocalciferol, (DRISDOL) 50000 units CAPS capsule Take 50,000 Units by mouth every 7 (seven) days.   Yes [provider]  ASPIRIN LOW DOSE 81 MG EC tablet TAKE 1 TABLET (81 MG TOTAL) BY MOUTH DAILY. Patient taking differently: Take 81 mg by mouth daily.  06/02/19   Jerrol Banana., MD  Bepotastine Besilate (BEPREVE) 1.5 % SOLN Place 1 drop into both eyes daily.    [provider]  Blood Glucose Monitoring Suppl (ONE TOUCH ULTRA MINI) w/Device KIT 1 kit by Does not apply route daily. 01/22/19   Jerrol Banana., MD  calcitRIOL (ROCALTROL) 0.25 MCG capsule Take 0.25 mcg by mouth daily.    [provider]  Insulin Pen Needle (B-D UF III MINI PEN NEEDLES) 31G X 5 MM MISC USE TWICE A DAY AS DIRECTED 02/11/19   Jerrol Banana., MD  loratadine (CLARITIN) 10 MG tablet Take 1 tablet (10 mg total) by mouth daily. Patient not taking: Reported on 06/05/2019 12/15/14   Jerrol Banana., MD  OneTouch Delica Lancets 47Q MISC USE AS DIRECTED 05/10/19   Jerrol Banana., MD  [facility] ULTRA test strip USE AS DIRECTED 04/20/19   Jerrol Banana., MD  quinapril (ACCUPRIL) 40 MG tablet TAKE 1 TABLET BY MOUTH EVERY DAY Patient not taking: Reported on 05/23/2019 09/22/18   Jerrol Banana., MD    Allergies Antihistamines, chlorpheniramine-type;  Codeine; and Paxil [paroxetine]  Family History  Problem Relation Age of Onset  . Heart attack Mother   . Heart disease Sister   . Cancer Sister     Social History Social History   Tobacco Use  . Smoking status: Former Smoker    Types: Cigarettes  . Smokeless tobacco: Never Used  . Tobacco comment: Quit in 2015-2016  Substance Use Topics  . Alcohol use: No  . Drug use: No    Review of Systems  Constitutional: Negative for fever. Eyes: Negative for visual changes. ENT: Negative for sore throat. Neck: No neck pain  Cardiovascular: Negative for chest pain. Respiratory: + shortness of breath. Gastrointestinal: Negative for abdominal pain, vomiting or diarrhea. Genitourinary: Negative for dysuria. Musculoskeletal: Negative for back pain. + Bilateral leg swelling Skin: Negative for rash. Neurological: Negative for headaches, weakness or numbness. Psych: No SI or HI  ____________________________________________   PHYSICAL EXAM:  VITAL SIGNS: ED Triage Vitals [06/05/19 0055]  Enc Vitals Group     BP (!) 166/65  Pulse Rate 78     Resp (!) 28     Temp 97.9 F (36.6 C)     Temp Source Oral     SpO2 (!) 81 %     Weight      Height      Head Circumference      Peak Flow      Pain Score      Pain Loc      Pain Edu?      Excl. in [place]?     Constitutional: Alert and oriented, severe respiratory distress.  HEENT:      Head: Normocephalic and atraumatic.         Eyes: Conjunctivae are normal. Sclera is non-icteric.       Mouth/Throat: Mucous membranes are moist.       Neck: Supple with no signs of meningismus. Cardiovascular: Regular rate and rhythm. No murmurs, gallops, or rubs. 2+ symmetrical distal pulses are present in all extremities. Elevated JVD. Respiratory: Patient in severe respiratory distress, hypoxic to the mid 50s on room air, severely diminished air movement bilaterally with wheezing Gastrointestinal: Soft, non tender, and non distended with  positive bowel sounds. No rebound or guarding. Musculoskeletal: 3+ pitting edema bilateral lower extremities to mid thighs Neurologic: Normal speech and language. Face is symmetric. Moving all extremities. No gross focal neurologic deficits are appreciated. Skin: Skin is warm, dry and intact. No rash noted. Psychiatric: Mood and affect are normal. Speech and behavior are normal.  ____________________________________________   LABS (all labs ordered are listed, but only abnormal results are displayed)  Labs Reviewed  BASIC METABOLIC PANEL - Abnormal; Notable for the following components:      Result Value   Glucose, Bld 150 (*)    BUN 61 (*)    Creatinine, Ser 3.86 (*)    GFR calc non Af Amer 10 (*)    GFR calc Af Amer 12 (*)    All other components within normal limits  CBC - Abnormal; Notable for the following components:   RBC 3.02 (*)    Hemoglobin 8.1 (*)    HCT 26.3 (*)    All other components within normal limits  BRAIN NATRIURETIC PEPTIDE - Abnormal; Notable for the following components:   B Natriuretic Peptide 577.0 (*)    All other components within normal limits  RESPIRATORY PANEL BY RT PCR (FLU A&B, COVID)  TROPONIN I (HIGH SENSITIVITY)  TROPONIN I (HIGH SENSITIVITY)   ____________________________________________  EKG  ED ECG REPORT I, Rudene Re, the attending physician, personally viewed and interpreted this ECG.  Normal sinus rhythm, rate of 67, normal intervals, normal axis, no ST elevations or depressions ____________________________________________  RADIOLOGY  I have personally reviewed the images performed during this visit and I agree with the Radiologist's read.   Interpretation by Radiologist:  DG Chest Port 1 View  Result Date: 06/05/2019 CLINICAL DATA:  Respiratory distress EXAM: PORTABLE CHEST 1 VIEW COMPARISON:  04/18/2017 FINDINGS: Cardiac shadow is mildly prominent but accentuated by the portable technique. Vascular congestion  increased interstitial markings are noted. No sizable effusion is seen. No bony is noted. IMPRESSION: Changes consistent with early CHF. Electronically Signed   By: Inez Catalina M.D.   On: 06/05/2019 01:17    ____________________________________________   PROCEDURES  Procedure(s) performed: None Procedures Critical Care performed: yes  CRITICAL CARE Performed by: Rudene Re  ?  Total critical care time: 40 min  Critical care time was exclusive of separately billable procedures and treating  other patients.  Critical care was necessary to treat or prevent imminent or life-threatening deterioration.  Critical care was time spent personally by me on the following activities: development of treatment plan with patient and/or surrogate as well as nursing, discussions with consultants, evaluation of patient's response to treatment, examination of patient, obtaining history from patient or surrogate, ordering and performing treatments and interventions, ordering and review of laboratory studies, ordering and review of radiographic studies, pulse oximetry and re-evaluation of patient's condition.  ____________________________________________   INITIAL IMPRESSION / ASSESSMENT AND PLAN / ED COURSE  86 y.o. female history of CHF, diabetes, hypertension, hyperlipidemia, chronic kidney disease, CAD who presents for evaluation of shortness of breath.  Patient arrives in severe respiratory distress, hypoxic to the mid 50s and extremely volume overloaded with 3+ pitting edema, wheezing, elevated JVD, and crackles bilaterally.  She was placed immediately on BiPAP with improvement of her breathing, oxygenation, and blood pressure.  Patient is on 40 mg of p.o. Lasix.  She received 40 mg of IV Lasix.  Chest x-ray consistent with CHF.  EKG with no acute ischemic changes.  Troponin is negative.  Patient be admitted to the hospitalist service for diuresis and echocardiogram.       As part of my  medical decision making, I reviewed the following data within the [facility] notes reviewed and incorporated, Labs reviewed , EKG interpreted , Old EKG reviewed, Old chart reviewed, Radiograph reviewed , Discussed with admitting physician , Notes from prior ED visits and [place] Controlled Substance Database   Please note:  Patient was evaluated in Emergency Department today for the symptoms described in the history of present illness. Patient was evaluated in the context of the global COVID-19 pandemic, which necessitated consideration that the patient might be at risk for infection with the SARS-CoV-2 virus that causes COVID-19. Institutional protocols and algorithms that pertain to the evaluation of patients at risk for COVID-19 are in a state of rapid change based on information released by regulatory bodies including the CDC and federal and state organizations. These policies and algorithms were followed during the patient's care in the ED.  Some ED evaluations and interventions may be delayed as a result of limited staffing during the pandemic.   ____________________________________________   FINAL CLINICAL IMPRESSION(S) / ED DIAGNOSES   Final diagnoses:  Acute respiratory failure with hypoxia (HCC)  Acute on chronic congestive heart failure, unspecified heart failure type ([place])      NEW MEDICATIONS STARTED DURING THIS VISIT:  ED Discharge Orders    None       Note:  This document was prepared using Dragon voice recognition software and may include unintentional dictation errors.    Alfred Levins, [place], MD 06/05/19 [id number]

## 2019-06-05 NOTE — Progress Notes (Signed)
    BRIEF OVERNIGHT PROGRESS REPORT  SUBJECTIVE: Patient complains of shortness of breath, noted to have bilateral wheezing and crackles.    OBJECTIVE: Patient assessed at the bedside, she was afebrile with blood pressure 160/71 mm Hg and pulse rate 97 beats/min. There were no focal neurological deficits; she was alert and oriented x4, but noted to be dyspneic with sats in the 80s on nasal cannula.  ASSESSMENT: 86 y.o.femalewith a known history of CHF, hypertension, dyslipidemia and coronary artery disease, presented to the emergency room with an onset of worsening dyspnea over the last few days with associated dry cough as well as worsening lower extremity edema, orthopnea and occasional paroxysmal nocturnal dyspnea.  PLAN: 1. Acute respiratory failure with hypoxia -secondary to CHF exacerbation *- stat ABG shows respiratory acidosis, placed on BiPAP with significant improvement in breathing - Chest x-ray shows stable with changes mild CHF. New left pleural effusion is seen. - Furosemide  IV diureses >1L negative per day until approach euvolemia / worsening renal function. - Low salt diet  - Strict I&O    Rufina Falco, DNP, CCRN, FNP-C [facility] Nurse Practitioner Between 7pm to 7am - Pager [phone]  After 7am go to [email or web address] - [email or web address]  [facility] [employer]  [phone]

## 2019-06-05 NOTE — H&P (Signed)
[facility] [place] NAME: Lori Stanley    MR#:  [id number]  DATE OF BIRTH:  [birth year]  DATE OF ADMISSION:  06/05/2019  PRIMARY CARE PHYSICIAN: Jerrol Banana., MD   REQUESTING/REFERRING PHYSICIAN: Gonzella Lex, MD CHIEF COMPLAINT:   Chief Complaint  Patient presents with  . Shortness of Breath    HISTORY OF PRESENT ILLNESS:  Lori Stanley  is a 86 y.o. female with a known history of CHF, hypertension, dyslipidemia and coronary artery disease, presented to the emergency room with an onset of worsening dyspnea over the last few days with associated dry cough as well as worsening lower extremity edema, orthopnea and occasional paroxysmal nocturnal dyspnea.  No dysuria, oliguria or hematuria or flank pain.  No chest pain or palpitations.  No headache or dizziness or blurred vision.  Upon presentation to the emergency room, blood pressure was 166/65 with respirate of 28 and pulse symmetry was 81% on 2 L of O2 by nasal cannula when he was placed on BiPAP with pulse oximetry improvement 100%.  Labs revealed a BUN of 61 and creatinine 3.86 compared to 40 and 3.2 on 04/10/2019.  BNP was 577 and high-sensitivity troponin was 3.  COVID-19 PCR came back negative.  Chest x-ray showed early CHF.  Influenza antigens came back negative.  EKG showed normal sinus rhythm with rate of 67 with probable left atrial enlargement  The patient was given 40 mg of IV Lasix.  He will be admitted to telemetry bed for further evaluation and management.  PAST MEDICAL HISTORY:   Past Medical History:  Diagnosis Date  . Acute heart failure ([place])   . Chronic low back pain   . Diabetes mellitus without complication ([place])   . Hyperlipidemia   . Hypertension   . MI (myocardial infarction) ([place])   . Obesity     PAST SURGICAL HISTORY:   Past Surgical History:  Procedure Laterality Date  . EYE SURGERY    . KNEE SURGERY    . THYROID SURGERY    . VAGINAL HYSTERECTOMY       SOCIAL HISTORY:   Social History   Tobacco Use  . Smoking status: Former Smoker    Types: Cigarettes  . Smokeless tobacco: Never Used  . Tobacco comment: Quit in 2015-2016  Substance Use Topics  . Alcohol use: No    FAMILY HISTORY:   Family History  Problem Relation Age of Onset  . Heart attack Mother   . Heart disease Sister   . Cancer Sister     DRUG ALLERGIES:   Allergies  Allergen Reactions  . Antihistamines, Chlorpheniramine-Type   . Codeine   . Paxil [Paroxetine]     "makes her feel funny" not in a good way    REVIEW OF SYSTEMS:   ROS As per history of present illness. All pertinent systems were reviewed above. Constitutional,  HEENT, cardiovascular, respiratory, GI, GU, musculoskeletal, neuro, psychiatric, endocrine,  integumentary and hematologic systems were reviewed and are otherwise  negative/unremarkable except for positive findings mentioned above in the HPI.   MEDICATIONS AT HOME:   Prior to Admission medications   Medication Sig Start Date End Date Taking? Authorizing Provider  acetaminophen (TYLENOL) 500 MG tablet Take 1,000 mg by mouth every 6 (six) hours as needed.    [provider]  albuterol (PROVENTIL HFA;VENTOLIN HFA) 108 (90 Base) MCG/ACT inhaler Inhale 2 puffs into the lungs every 6 (six) hours as needed for wheezing or shortness of  breath. 04/13/17   Loletha Grayer, MD  amLODipine (NORVASC) 5 MG tablet Take 1 tablet (5 mg total) by mouth daily. 11/05/18   Jerrol Banana., MD  ASPIRIN LOW DOSE 81 MG EC tablet TAKE 1 TABLET (81 MG TOTAL) BY MOUTH DAILY. 06/02/19   Jerrol Banana., MD  Bepotastine Besilate (BEPREVE) 1.5 % SOLN Place 1 drop into both eyes daily.    [provider]  Blood Glucose Monitoring Suppl (ONE TOUCH ULTRA MINI) w/Device KIT 1 kit by Does not apply route daily. 01/22/19   Jerrol Banana., MD  brimonidine [facility]) 0.2 % ophthalmic solution  03/06/19   [provider]    calcitRIOL (ROCALTROL) 0.25 MCG capsule TAKE 1 CAPSULE BY MOUTH THREE TIMES A WEEK TAKE MONDAY, [facility] AND FRIDAY 10/23/18   [provider]  calcitRIOL (ROCALTROL) 0.25 MCG capsule Take 0.25 mcg by mouth daily.    [provider]  carvedilol (COREG) 12.5 MG tablet TAKE 1 TABLET (12.5 MG TOTAL) BY MOUTH 2 (TWO) TIMES DAILY. 06/02/19   Jerrol Banana., MD  dorzolamide-timolol (COSOPT) 22.3-6.8 MG/ML ophthalmic solution  03/06/19   [provider]  Doxepin HCl 3 MG TABS Take 1 tablet (3 mg total) by mouth at bedtime. 05/31/19   Mar Daring, PA-C  furosemide (LASIX) 40 MG tablet TAKE 1 TABLET BY MOUTH TWICE A DAY 04/19/19   Jerrol Banana., MD  Insulin Lispro Prot & Lispro (HUMALOG MIX 75/25 KWIKPEN) (75-25) 100 UNIT/ML Kwikpen Inject 4 Units into the skin 2 (two) times daily. Patient taking differently: Inject 2 Units into the skin 2 (two) times daily.  07/03/18   Jerrol Banana., MD  Insulin Pen Needle (B-D UF III MINI PEN NEEDLES) 31G X 5 MM MISC USE TWICE A DAY AS DIRECTED 02/11/19   Jerrol Banana., MD  latanoprost (XALATAN) 0.005 % ophthalmic solution Place 1 drop into the left eye at bedtime.  02/11/15   [provider]  loratadine (CLARITIN) 10 MG tablet Take 1 tablet (10 mg total) by mouth daily. 12/15/14   Jerrol Banana., MD  OneTouch Delica Lancets 75Z MISC USE AS DIRECTED 05/10/19   Jerrol Banana., MD  [facility] ULTRA test strip USE AS DIRECTED 04/20/19   Jerrol Banana., MD  quinapril (ACCUPRIL) 40 MG tablet TAKE 1 TABLET BY MOUTH EVERY DAY Patient not taking: Reported on 05/23/2019 09/22/18   Jerrol Banana., MD  sertraline (ZOLOFT) 25 MG tablet TAKE 1 TABLET BY MOUTH EVERY DAY 04/02/19   Jerrol Banana., MD  telmisartan (MICARDIS) 20 MG tablet TAKE 1 TABLET BY MOUTH EVERY DAY 06/02/19   Jerrol Banana., MD  Vitamin D, Ergocalciferol, (DRISDOL) 50000 units CAPS capsule Take 50,000  Units by mouth every 7 (seven) days.    [provider]      VITAL SIGNS:  Blood pressure 138/65, pulse (!) 59, temperature 97.9 F (36.6 C), temperature source Oral, resp. rate 15, SpO2 100 %.  PHYSICAL EXAMINATION:  Physical Exam  GENERAL:  86 y.o.-year-old patient lying in the bed with mild respiratory distress on BiPAP. EYES: Pupils equal, round, reactive to light and accommodation. No scleral icterus. Extraocular muscles intact.  HEENT: Head atraumatic, normocephalic. Oropharynx and nasopharynx clear.  NECK:  Supple, no jugular venous distention. No thyroid enlargement, no tenderness.  LUNGS: Diminished bibasilar breath sounds with bibasal crackles. CARDIOVASCULAR: Regular rate and rhythm, S1, S2 normal. No  murmurs, rubs, or gallops.  ABDOMEN: Soft, nondistended, nontender. Bowel sounds present. No organomegaly or mass.  EXTREMITIES: 1-2+ bilateral lower extremity pitting edema without cyanosis, or clubbing.  NEUROLOGIC: Cranial nerves II through XII are intact. Muscle strength 5/5 in all extremities. Sensation intact. Gait not checked.  PSYCHIATRIC: The patient is alert and oriented x 3.  Normal affect and good eye contact. SKIN: No obvious rash, lesion, or ulcer.   LABORATORY PANEL:   CBC Recent Labs  Lab 06/05/19 0102  WBC 7.1  HGB 8.1*  HCT 26.3*  PLT 266   ------------------------------------------------------------------------------------------------------------------  Chemistries  Recent Labs  Lab 06/05/19 0102  NA 136  K 4.7  CL 102  CO2 25  GLUCOSE 150*  BUN 61*  CREATININE 3.86*  CALCIUM 9.2   ------------------------------------------------------------------------------------------------------------------  Cardiac Enzymes No results for input(s): TROPONINI in the last 168 hours. ------------------------------------------------------------------------------------------------------------------  RADIOLOGY:  DG Chest Port 1 View  Result  Date: 06/05/2019 CLINICAL DATA:  Respiratory distress EXAM: PORTABLE CHEST 1 VIEW COMPARISON:  04/18/2017 FINDINGS: Cardiac shadow is mildly prominent but accentuated by the portable technique. Vascular congestion increased interstitial markings are noted. No sizable effusion is seen. No bony is noted. IMPRESSION: Changes consistent with early CHF. Electronically Signed   By: Inez Catalina M.D.   On: 06/05/2019 01:17      IMPRESSION AND PLAN:   1.  Acute on chronic diastolic CHF with subsequent acute hypoxic respiratory failure.  The patient had a 2D echo in 2016 revealing an EF of 60 to 65% with grade 1 diastolic dysfunction, mild mitral regurgitation and mild left atrial dilatation.  The patient will be admitted to a telemetry bed and will be diuresed with IV Lasix.  Will follow serial cardiac enzymes.  Will obtain a 2D echo(if not done last 6 months) and a cardiology consultation in a.m. we will continue BiPAP and taper to nasal cannula when tolerable.  2.  Acute kidney injury superimposed on stage IV chronic kidney disease.  This is likely prerenal secondary to acute CHF and subsequent renal hypoperfusion.  Will follow BMP with diuresis and hold off Avapro for now.  3.  Hypertension.  We will continue Coreg and amlodipine and hold off Avapro given acute kidney injury.  4.  Type 2 diabetes mellitus.  We will place the patient on supplemental coverage with NovoLog and continue basal coverage.  5.  Glaucoma.  We will continue ophthalmic GTT.  6.  DVT prophylaxis.  Subcutaneous heparin.   All the records are reviewed and case discussed with ED provider. The plan of care was discussed in details with the patient (and family). I answered all questions. The patient agreed to proceed with the above mentioned plan. Further management will depend upon hospital course.   CODE STATUS: Full code  TOTAL TIME TAKING CARE OF THIS PATIENT: 55 minutes.    Christel Mormon M.D on 06/05/2019 at 2:32 AM  [facility]   From 7 PM-7 AM, contact night-coverage [email or web address]  CC: Primary care physician; Jerrol Banana., MD   Note: This dictation was prepared with Dragon dictation along with smaller phrase technology. Any transcriptional errors that result from this process are unintentional.

## 2019-06-05 NOTE — ED Notes (Signed)
Respritory called for BIPAP

## 2019-06-05 NOTE — ED Triage Notes (Signed)
Pt arrived via EMS from home with c/o respiratory distress. Per pt, she has had increased SOB over the last 3-4 days, worse tonight. Pt has bilateral edema to lower extremities. Pt has auditory wheezing. Wet fields in all 4 lobes. Pt has hx/o CHF and uses O2 PRN at home. Pt 82% on 2L O2 on arrival to ED. Pt bumped up to 6L with sat of 91%. Pt takes 40mg  lasix. Per EMS pt was hypertensive and received 2 sublingual nitro and has 1 1/2 inch paste on right side of chest.

## 2019-06-05 NOTE — Progress Notes (Signed)
*  PRELIMINARY RESULTS* Echocardiogram 2D Echocardiogram has been performed.  Sherrie Sport 06/05/2019, 2:14 PM

## 2019-06-05 NOTE — Progress Notes (Signed)
PROGRESS NOTE    Lori Stanley  [id number] DOB: 06-27-1931 DOA: 06/05/2019 PCP: Jerrol Banana., MD    Assessment & Plan:   Active Problems:   Acute CHF (congestive heart failure) (HCC)   Heart failure (HCC)   Edema of both lower extremities    Lori Stanley  is a 86 y.o. female with a known history of CHF, hypertension, dyslipidemia and coronary artery disease, presented to the emergency room with an onset of worsening dyspnea over the last few days with associated dry cough as well as worsening lower extremity edema, orthopnea and occasional paroxysmal nocturnal dyspnea.   Upon presentation to the emergency room, blood pressure was 166/65 with respirate of 28 and pulse symmetry was 81% on 2 L of O2 by nasal cannula when he was placed on BiPAP with pulse oximetry improvement 100%.  Labs revealed a BUN of 61 and creatinine 3.86 compared to 40 and 3.2 on 04/10/2019.  BNP was 577 and high-sensitivity troponin was 3.  COVID-19 PCR came back negative.  Chest x-ray showed early CHF.  Influenza antigens came back negative.  EKG showed normal sinus rhythm with rate of 67 with probable left atrial enlargement  .  Acute on chronic diastolic CHF with subsequent acute hypoxic respiratory failure.   --patient had a 2D echo in 2016 revealing an EF of 60 to 65% with grade 1 diastolic dysfunction, mild mitral regurgitation and mild left atrial dilatation.   --Was on BiPAP and weaned down to 4L [place] --Received IV lasix 40 mg in the ED PLAN: --continue IV Lasix 40 mg BID --Strict I/O --TTE  2.  Acute kidney injury superimposed on stage IV chronic kidney disease.  This is likely prerenal secondary to acute CHF and subsequent renal hypoperfusion.  Will follow BMP with diuresis and hold off Avapro for now.  3.  Hypertension.  We will continue Coreg and amlodipine and hold off Avapro given acute kidney injury.  4.  Type 2 diabetes mellitus.  We will place the patient on supplemental  coverage with NovoLog and continue basal coverage.  5.  Glaucoma.  We will continue ophthalmic GTT.  Anxiety --continue home Zoloft --Atarax nightly PRN   DVT prophylaxis: Heparin SQ Code Status: Full code  Family Communication: not today Disposition Plan: Home   Subjective and Interval History:  Pt was weaned off BiPAP this morning in the ED, so changed level of care to Med-Surg.  Pt reported breathing better, and having put out a lot of urine.  Has BLE swelling which pt said was chronic.  No fever, chest pain, abdominal pain, N/V/D, dysuria.    Objective: Vitals:   06/05/19 1950 06/05/19 1953 06/05/19 2000 06/05/19 2002  BP:      Pulse:      Resp:  (!) 23    Temp:      TempSrc:      SpO2: (!) 88% 94% 97% 97%  Height:        Intake/Output Summary (Last 24 hours) at 06/05/2019 2046 Last data filed at 06/05/2019 2020 Gross per 24 hour  Intake 240 ml  Output 450 ml  Net -210 ml   There were no vitals filed for this visit.  Examination:   Constitutional: NAD, AAOx3 HEENT: conjunctivae and lids normal, EOMI CV: RRR 2+ systolic murmur at upper sternal boarders. Distal pulses +2.  No cyanosis.   RESP: Reduced lung sounds, on 3-5L GI: +BS, NTND Extremities: Swelling in BLE SKIN: warm, dry and intact Neuro: II -  XII grossly intact.  Sensation intact Psych: Normal mood and affect.  Appropriate judgement and reason   Data Reviewed: I have personally reviewed following labs and imaging studies  CBC: Recent Labs  Lab 06/05/19 0102 06/05/19 0647  WBC 7.1 5.3  NEUTROABS  --  4.3  HGB 8.1* 7.1*  HCT 26.3* 23.6*  MCV 87.1 88.7  PLT 266 [id number]   Basic Metabolic Panel: Recent Labs  Lab 06/05/19 0102  NA 136  K 4.7  CL 102  CO2 25  GLUCOSE 150*  BUN 61*  CREATININE 3.86*  CALCIUM 9.2   GFR: CrCl cannot be calculated (Unknown ideal weight.). Liver Function Tests: Recent Labs  Lab 06/05/19 0102  AST 26  ALT 19  ALKPHOS 69  BILITOT 0.6  PROT 6.7   ALBUMIN 3.6   No results for input(s): LIPASE, AMYLASE in the last 168 hours. No results for input(s): AMMONIA in the last 168 hours. Coagulation Profile: No results for input(s): INR, PROTIME in the last 168 hours. Cardiac Enzymes: No results for input(s): CKTOTAL, CKMB, CKMBINDEX, TROPONINI in the last 168 hours. BNP (last 3 results) No results for input(s): PROBNP in the last 8760 hours. HbA1C: No results for input(s): HGBA1C in the last 72 hours. CBG: Recent Labs  Lab 06/05/19 0857 06/05/19 1155 06/05/19 1720  GLUCAP 92 133* 98   Lipid Profile: No results for input(s): CHOL, HDL, LDLCALC, TRIG, CHOLHDL, LDLDIRECT in the last 72 hours. Thyroid Function Tests: Recent Labs    06/05/19 0102  TSH 3.498   Anemia Panel: Recent Labs    06/05/19 1422  VITAMINB12 161*  FOLATE 9.0  TIBC 322  IRON 40   Sepsis Labs: No results for input(s): PROCALCITON, LATICACIDVEN in the last 168 hours.  Recent Results (from the past 240 hour(s))  Respiratory Panel by RT PCR (Flu A&B, Covid) - Nasopharyngeal Swab     Status: None   Collection Time: 06/05/19  2:27 AM   Specimen: Nasopharyngeal Swab  Result Value Ref Range Status   SARS Coronavirus 2 by RT PCR NEGATIVE NEGATIVE Final    Comment: (NOTE) SARS-CoV-2 target nucleic acids are NOT DETECTED. The SARS-CoV-2 RNA is generally detectable in upper respiratoy specimens during the acute phase of infection. The lowest concentration of SARS-CoV-2 viral copies this assay can detect is 131 copies/mL. A negative result does not preclude SARS-Cov-2 infection and should not be used as the sole basis for treatment or other patient management decisions. A negative result may occur with  improper specimen collection/handling, submission of specimen other than nasopharyngeal swab, presence of viral mutation(s) within the areas targeted by this assay, and inadequate number of viral copies (<131 copies/mL). A negative result must be combined  with clinical observations, patient history, and epidemiological information. The expected result is Negative. Fact Sheet for Patients:  PinkCheek.be Fact Sheet for Healthcare Providers:  GravelBags.it This test is not yet ap proved or cleared by the [place] FDA and  has been authorized for detection and/or diagnosis of SARS-CoV-2 by FDA under an Emergency Use Authorization (EUA). This EUA will remain  in effect (meaning this test can be used) for the duration of the COVID-19 declaration under Section 564(b)(1) of the Act, 21 U.S.C. section 360bbb-3(b)(1), unless the authorization is terminated or revoked sooner.    Influenza A by PCR NEGATIVE NEGATIVE Final   Influenza B by PCR NEGATIVE NEGATIVE Final    Comment: (NOTE) The Xpert Xpress SARS-CoV-2/FLU/RSV assay is intended as an aid in  the diagnosis  of influenza from Nasopharyngeal swab specimens and  should not be used as a sole basis for treatment. Nasal washings and  aspirates are unacceptable for Xpert Xpress SARS-CoV-2/FLU/RSV  testing. Fact Sheet for Patients: PinkCheek.be Fact Sheet for Healthcare Providers: GravelBags.it This test is not yet approved or cleared by the [place] FDA and  has been authorized for detection and/or diagnosis of SARS-CoV-2 by  FDA under an Emergency Use Authorization (EUA). This EUA will remain  in effect (meaning this test can be used) for the duration of the  Covid-19 declaration under Section 564(b)(1) of the Act, 21  U.S.C. section 360bbb-3(b)(1), unless the authorization is  terminated or revoked. Performed at [facility], [address], [place], [place] [address]       Radiology Studies: DG Chest 1 View  Result Date: 06/05/2019 CLINICAL DATA:  Shortness of breath EXAM: CHEST  1 VIEW COMPARISON:  Film from earlier in the same day. FINDINGS: Cardiac  shadow is stable. Aortic calcifications are again seen. Poor inspiratory effort is seen. Persistent vascular congestion is noted with small left pleural effusion again consistent with mild CHF. IMPRESSION: The overall appearance of the chest is stable with changes mild CHF. New left pleural effusion is seen. Electronically Signed   By: Inez Catalina M.D.   On: 06/05/2019 20:13   DG Chest Port 1 View  Result Date: 06/05/2019 CLINICAL DATA:  Respiratory distress EXAM: PORTABLE CHEST 1 VIEW COMPARISON:  04/18/2017 FINDINGS: Cardiac shadow is mildly prominent but accentuated by the portable technique. Vascular congestion increased interstitial markings are noted. No sizable effusion is seen. No bony is noted. IMPRESSION: Changes consistent with early CHF. Electronically Signed   By: Inez Catalina M.D.   On: 06/05/2019 01:17     Scheduled Meds: . aspirin EC  81 mg Oral Daily  . brimonidine  1 drop Both Eyes BID  . calcitRIOL  0.25 mcg Oral Daily  . calcitRIOL  0.25 mcg Oral Q M,W,F  . carvedilol  12.5 mg Oral BID  . dorzolamide-timolol  1 drop Both Eyes BID  . Doxepin HCl  1 tablet Oral QHS  . heparin injection (subcutaneous)  5,000 Units Subcutaneous Q8H  . insulin aspart  0-9 Units Subcutaneous TID WC  . ipratropium-albuterol  3 mL Nebulization QID  . latanoprost  1 drop Left Eye QHS  . olopatadine  1 drop Both Eyes BID  . sertraline  25 mg Oral Daily  . sodium chloride flush  3 mL Intravenous Q12H  . [START ON 06/07/2019] Vitamin D (Ergocalciferol)  50,000 Units Oral Q7 days   Continuous Infusions: . sodium chloride       LOS: 0 days     Enzo Bi, MD [facility] If 7PM-7AM, please contact night-coverage 06/05/2019, 8:46 PM

## 2019-06-05 NOTE — ED Notes (Signed)
Pt taken off BiPap and placed on 3L [place]. Pt O2 sat is 97% and pt denies SOB.

## 2019-06-05 NOTE — Progress Notes (Signed)
MD notified. Pt complains of feeling anxious.  No new orders at this time. I will continue to assess.

## 2019-06-05 NOTE — Consult Note (Signed)
Cardiology Consultation:   Patient ID: Lori Stanley MRN: [id number]; DOB: Jun 03, 1931  Admit date: 06/05/2019 Date of Consult: 06/05/2019  Primary Care Provider: Jerrol Banana., MD Primary Cardiologist: Dr. Fletcher Anon Primary Electrophysiologist:  None    Patient Profile:   Lori Stanley is a 86 y.o. female with a hx of chronic diastolic heart failure, hypertension, DM2, CKD, and who is being seen today for the evaluation of acute on chronic diastolic heart failure at the request of Dr. Sidney Ace.  History of Present Illness:   Lori Stanley is an 86 year old female with PMH as above. She reports a past history of smoking, quitting 80 years ago. She does not drink alcohol or use any illegal drugs. She was hospitalized in January 2015 with acute diastolic heart failure in the setting of uncontrolled hypertension.  Echo showed EF 55-60%, moderate LVH, G1DD.  She underwent 2015 stress test that showed no evidence of ischemia, normal EF, and was ruled low risk.  Subsequent renal artery duplex showed no evidence of RAS.  She was last seen in the clinic 03/14/2016 by her primary cardiologist, at which time she was doing well from a cardiac standpoint.  Since that time, she has reportedly been doing well.  She reports that she regularly follows with her nephrologist, Dr. Juleen China.    Over the last year, she has noted progressive shortness of breath.  In the last 3 days, she became acutely SOB with DOE.  She also noted increased lower extremity edema unproductive cough. She reported stable two pillow orthopnea (not increased from baseline) though did note PND. No reported abdominal distention or early satiety. No chest pain, racing HR, or palpitations. She currently does not use oxygen at home but has in the past. She denies any dizziness, syncope LOC, or mechanical falls. No recent s/sx consistent with bleeding.   She reports medication compliance and that she still has urine output with her PTA  diuretic. She does not add salt to her food and eats out 2-3 times a month.   Heart Pathway Score:     Past Medical History:  Diagnosis Date  . Acute heart failure ([place])   . Chronic low back pain   . Diabetes mellitus without complication ([place])   . Hyperlipidemia   . Hypertension   . MI (myocardial infarction) ([place])   . Obesity     Past Surgical History:  Procedure Laterality Date  . EYE SURGERY    . KNEE SURGERY    . THYROID SURGERY    . VAGINAL HYSTERECTOMY       Home Medications:  Prior to Admission medications   Medication Sig Start Date End Date Taking? Authorizing Provider  acetaminophen (TYLENOL) 500 MG tablet Take 1,000 mg by mouth every 6 (six) hours as needed.   Yes [provider]  albuterol (PROVENTIL HFA;VENTOLIN HFA) 108 (90 Base) MCG/ACT inhaler Inhale 2 puffs into the lungs every 6 (six) hours as needed for wheezing or shortness of breath. 04/13/17  Yes Wieting, Richard, MD  amLODipine (NORVASC) 5 MG tablet Take 1 tablet (5 mg total) by mouth daily. 11/05/18  Yes Jerrol Banana., MD  brimonidine [facility]) 0.2 % ophthalmic solution 1 drop 2 (two) times daily. To affected eye(s) 03/06/19  Yes [provider]  calcitRIOL (ROCALTROL) 0.25 MCG capsule Take 0.25 mcg by mouth every Monday, Wednesday, and Friday.  10/23/18  Yes [provider]  carvedilol (COREG) 12.5 MG tablet TAKE 1 TABLET (12.5 MG TOTAL) BY MOUTH  2 (TWO) TIMES DAILY. 06/02/19  Yes Jerrol Banana., MD  dorzolamide-timolol (COSOPT) 22.3-6.8 MG/ML ophthalmic solution Place 1 drop into both eyes 2 (two) times daily.  03/06/19  Yes [provider]  Doxepin HCl 3 MG TABS Take 1 tablet (3 mg total) by mouth at bedtime. 05/31/19  Yes Mar Daring, PA-C  furosemide (LASIX) 40 MG tablet TAKE 1 TABLET BY MOUTH TWICE A DAY 04/19/19  Yes Jerrol Banana., MD  Insulin Lispro Prot & Lispro (HUMALOG MIX 75/25 KWIKPEN) (75-25) 100 UNIT/ML Kwikpen Inject 4 Units  into the skin 2 (two) times daily. Patient taking differently: Inject 2 Units into the skin 2 (two) times daily.  07/03/18  Yes Jerrol Banana., MD  latanoprost (XALATAN) 0.005 % ophthalmic solution Place 1 drop into the left eye at bedtime.  02/11/15  Yes [provider]  sertraline (ZOLOFT) 25 MG tablet TAKE 1 TABLET BY MOUTH EVERY DAY 04/02/19  Yes Jerrol Banana., MD  telmisartan (MICARDIS) 20 MG tablet TAKE 1 TABLET BY MOUTH EVERY DAY 06/02/19  Yes Jerrol Banana., MD  Vitamin D, Ergocalciferol, (DRISDOL) 50000 units CAPS capsule Take 50,000 Units by mouth every 7 (seven) days.   Yes [provider]  ASPIRIN LOW DOSE 81 MG EC tablet TAKE 1 TABLET (81 MG TOTAL) BY MOUTH DAILY. Patient taking differently: Take 81 mg by mouth daily.  06/02/19   Jerrol Banana., MD  Bepotastine Besilate (BEPREVE) 1.5 % SOLN Place 1 drop into both eyes daily.    [provider]  Blood Glucose Monitoring Suppl (ONE TOUCH ULTRA MINI) w/Device KIT 1 kit by Does not apply route daily. 01/22/19   Jerrol Banana., MD  calcitRIOL (ROCALTROL) 0.25 MCG capsule Take 0.25 mcg by mouth daily.    [provider]  Insulin Pen Needle (B-D UF III MINI PEN NEEDLES) 31G X 5 MM MISC USE TWICE A DAY AS DIRECTED 02/11/19   Jerrol Banana., MD  loratadine (CLARITIN) 10 MG tablet Take 1 tablet (10 mg total) by mouth daily. Patient not taking: Reported on 06/05/2019 12/15/14   Jerrol Banana., MD  OneTouch Delica Lancets 61W MISC USE AS DIRECTED 05/10/19   Jerrol Banana., MD  [facility] ULTRA test strip USE AS DIRECTED 04/20/19   Jerrol Banana., MD  quinapril (ACCUPRIL) 40 MG tablet TAKE 1 TABLET BY MOUTH EVERY DAY Patient not taking: Reported on 05/23/2019 09/22/18   Jerrol Banana., MD    Inpatient Medications: Scheduled Meds: . amLODipine  5 mg Oral Daily  . aspirin EC  81 mg Oral Daily  . brimonidine  1 drop Both Eyes BID  . calcitRIOL   0.25 mcg Oral Daily  . calcitRIOL  0.25 mcg Oral Q M,W,F  . carvedilol  12.5 mg Oral BID  . dorzolamide-timolol  1 drop Both Eyes BID  . Doxepin HCl  1 tablet Oral QHS  . furosemide  40 mg Intravenous Q12H  . heparin injection (subcutaneous)  5,000 Units Subcutaneous Q8H  . insulin aspart  0-9 Units Subcutaneous TID PC & HS  . latanoprost  1 drop Left Eye QHS  . olopatadine  1 drop Both Eyes BID  . sertraline  25 mg Oral Daily  . sodium chloride flush  3 mL Intravenous Q12H  . [START ON 06/07/2019] Vitamin D (Ergocalciferol)  50,000 Units Oral Q7 days   Continuous Infusions: . sodium chloride  PRN Meds: sodium chloride, acetaminophen, ondansetron (ZOFRAN) IV, sodium chloride flush  Allergies:    Allergies  Allergen Reactions  . Antihistamines, Chlorpheniramine-Type   . Codeine   . Paxil [Paroxetine]     "makes her feel funny" not in a good way    Social History:   Social History   Socioeconomic History  . Marital status: Widowed    Spouse name: Not on file  . Number of children: 1  . Years of education: Not on file  . Highest education level: 11th grade  Occupational History  . Occupation: retired  Tobacco Use  . Smoking status: Former Smoker    Types: Cigarettes  . Smokeless tobacco: Never Used  . Tobacco comment: Quit in 2015-2016  Substance and Sexual Activity  . Alcohol use: No  . Drug use: No  . Sexual activity: Never  Other Topics Concern  . Not on file  Social History Narrative  . Not on file   Social Determinants of Health   Financial Resource Strain: Low Risk   . Difficulty of Paying Living Expenses: Not hard at all  Food Insecurity: No Food Insecurity  . Worried About Charity fundraiser in the Last Year: Never true  . Ran Out of Food in the Last Year: Never true  Transportation Needs: No Transportation Needs  . Lack of Transportation (Medical): No  . Lack of Transportation (Non-Medical): No  Physical Activity: Inactive  . Days of Exercise  per Week: 0 days  . Minutes of Exercise per Session: 0 min  Stress: No Stress Concern Present  . Feeling of Stress : Only a little  Social Connections: Somewhat Isolated  . Frequency of Communication with Friends and Family: More than three times a week  . Frequency of Social Gatherings with Friends and Family: Once a week  . Attends Religious Services: More than 4 times per year  . Active Member of Clubs or Organizations: No  . Attends Archivist Meetings: Never  . Marital Status: Widowed  Intimate Partner Violence: Not At Risk  . Fear of Current or Ex-Partner: No  . Emotionally Abused: No  . Physically Abused: No  . Sexually Abused: No    Family History:    Family History  Problem Relation Age of Onset  . Heart attack Mother   . Heart disease Sister   . Cancer Sister      ROS:  Please see the history of present illness.  Review of Systems  Respiratory: Positive for shortness of breath. Negative for hemoptysis.   Cardiovascular: Positive for orthopnea, leg swelling and PND. Negative for chest pain and palpitations.  Gastrointestinal: Negative for melena, nausea and vomiting.  Genitourinary: Negative for hematuria.  Musculoskeletal: Negative for falls.  Neurological: Negative for dizziness and loss of consciousness.    All other ROS reviewed and negative.     Physical Exam/Data:   Vitals:   06/05/19 1115 06/05/19 1130 06/05/19 1145 06/05/19 1200  BP: (!) 161/69 (!) 164/68 (!) 151/65 (!) 149/67  Pulse: 73 69 67 65  Resp: (!) 23 (!) 21 (!) 23 (!) 22  Temp:      TempSrc:      SpO2: 96% 92% 95% 96%   No intake or output data in the 24 hours ending 06/05/19 1234 Last 3 Weights 04/10/2019 03/11/2019 11/05/2018  Weight (lbs) 137 lb 135 lb 130 lb  Weight (kg) 62.143 kg 61.236 kg 58.968 kg     There is no height or weight on  file to calculate BMI.  General:  Well nourished, well developed, in no acute distress HEENT: normal. On 3.5L [place] oxygen. Neck: JVD difficult  to assess due to body habitus Vascular: No carotid bruits; radial pulses 2+ bilaterally Cardiac:  normal S1, S2; RRR; 2/6 systolic murmur Lungs: Coarse breath sounds bilaterally, bibasilar crackles, no wheezing Abd: soft, nontender, no hepatomegaly  Ext: 1-2+ bilateral LEE Musculoskeletal:  No deformities, BUE and BLE strength normal and equal Skin: warm and dry  Neuro:  No focal abnormalities noted Psych:  Normal affect   EKG:  The EKG was personally reviewed and demonstrates: NSR, 80 bpm, R/L electrode reversal.  Repeat EKG showed NSR, 67 bpm, baseline artifact, poor R wave progression in V1. Telemetry:  Telemetry was personally reviewed and demonstrates:  NSR, 60-80s  Relevant CV Studies: Echo 04/11/2017 - Left ventricle: The cavity size was normal. There was mild  concentric hypertrophy. Systolic function was normal. The  estimated ejection fraction was in the range of 60% to 65%. Wall  motion was normal; there were no regional wall motion  abnormalities. Doppler parameters are consistent with abnormal  left ventricular relaxation (grade 1 diastolic dysfunction).  - Mitral valve: Calcified annulus. There was mild regurgitation.  - Left atrium: The atrium was mildly dilated.  - Right ventricle: Systolic function was normal.  - Pulmonary arteries: Systolic pressure was within the normal  range.   05/30/2013 NM Myoview No significant wall motion abnormality noted.  Overall, low risk scan.  No significant ischemia.  EF 64%.  LV global function normal.  No EKG changes concerning for ischemia.  Attenuation artifact noted in the study.  Laboratory Data:  High Sensitivity Troponin:   Recent Labs  Lab 06/05/19 0102  TROPONINIHS 3     Cardiac EnzymesNo results for input(s): TROPONINI in the last 168 hours. No results for input(s): TROPIPOC in the last 168 hours.  Chemistry Recent Labs  Lab 06/05/19 0102  NA 136  K 4.7  CL 102  CO2 25  GLUCOSE 150*  BUN 61*   CREATININE 3.86*  CALCIUM 9.2  GFRNONAA 10*  GFRAA 12*  ANIONGAP 9    No results for input(s): PROT, ALBUMIN, AST, ALT, ALKPHOS, BILITOT in the last 168 hours. Hematology Recent Labs  Lab 06/05/19 0102 06/05/19 0647  WBC 7.1 5.3  RBC 3.02* 2.66*  HGB 8.1* 7.1*  HCT 26.3* 23.6*  MCV 87.1 88.7  MCH 26.8 26.7  MCHC 30.8 30.1  RDW 14.4 14.6  PLT 266 210   BNP Recent Labs  Lab 06/05/19 0102  BNP 577.0*    DDimer No results for input(s): DDIMER in the last 168 hours.   Radiology/Studies:  DG Chest Port 1 View  Result Date: 06/05/2019 CLINICAL DATA:  Respiratory distress EXAM: PORTABLE CHEST 1 VIEW COMPARISON:  04/18/2017 FINDINGS: Cardiac shadow is mildly prominent but accentuated by the portable technique. Vascular congestion increased interstitial markings are noted. No sizable effusion is seen. No bony is noted. IMPRESSION: Changes consistent with early CHF. Electronically Signed   By: Inez Catalina M.D.   On: 06/05/2019 01:17    Assessment and Plan:   Acute on chronic diastolic heart failure -Reports improving shortness of breath on nasal cannula oxygen 3.5 L.  Chest x-ray showed changes consistent with early CHF.  BNP 577.0.  Volume overloaded on exam. --Update echo to reassess EF and rule out acute structural abnormalities. Loud murmur on exam. 2018 echo showed mild MR and nl pump function. Consider worsening valvular disease as  contributing to shortness of breath as below.  --Continue gentle IV diuresis, given bump in renal function.  Escalation of IV diuresis currently complicated by AOCKD.  Most recent Scr 3.86 with BUN 61 and elevated from her baseline.  She does report urine output leading up to her admission.  PTA medications include Lasix 40 mg twice daily, which she has been taking.  Consider nephrology consult - she follows with [facility] as an outpatient.  --Continue to monitor daily BMET to monitor renal function and electrolytes, daily weights, I/os. --Continue gentle  IV diuresis and titrate as renal function allows. Continue BB and amlodipine as HR allows for BP support. Current renal function precludes the addition of ACE/ARB/ARNI/MRA. Further recommendations pending echocardiogram.  If EF reduced, will need to consider further outpatient ischemic work-up.  If IV diuresis continues to be complicated by renal function, consider RHC for optimal understanding of hemodynamics and cardiac pressures. CHF education including low salt diet reviewed with patient. Continue oxygen and wean as tolerated.   Hypertension --Poorly controlled. Continue current medications, including amlodipine and Coreg. Titrate as HR allows for optimal BP support. Current renal function precludes the addition of ACE/ARB/ARNI/MRA.   Hyperlipidemia --Previously on simvastatin 40 mg once daily, which appears to have fallen off of her medications last.  10/2018 LDL 163 with total cholesterol 233.  Recheck LFTs.  Recommend restart statin if LFTs stable for risk factor modification with recheck of LFTs and lipids in 6 to 8 weeks.  DM2 --Recommend strict glycemic control for risk factor modification.  Currently not well controlled.  04/2019 HgbA1c 6.0.  Recheck.  Likely contributing to poor renal function. SSI.  Per IM.  AOCKD Anemia of unknown etiology --Last creatinine 3.86 with BUN 61 and elevated from baseline.  Recommend nephrology consult.  Consider poorly controlled diabetes as contributing to current renal function.  Avoid nephrotoxins, including contrast studies.  No ACE/ARB/ARNI/MRA given renal function. Also noted is her anemia. Considered anemia of chronic dz; however, recommend further workup to r/o additional etiology given currently 7.  Anemia of unknown Etiology, possible anemia of chronic dz --As above, consider iron studies given hemoglobin 7.1 with RBC 2.66 and hematocrit 23.6.  Also considered is anemia of chronic disease.  Consider nephrology consult as above.     For  questions or updates, please contact [place] Please consult [email or web address] for contact info under     Signed, Arvil Chaco, PA-C  06/05/2019 12:34 PM

## 2019-06-05 NOTE — TOC Initial Note (Signed)
Transition of Care [facility]) - Initial/Assessment Note    Patient Details  Name: Lori Stanley MRN: [id number] Date of Birth: May 11, 1931  [facility] CM/SW Contact:    Anselm Pancoast, RN Phone Number: 06/05/2019, 11:31 AM  Clinical Narrative:                 Spoke with daughter in law who states she is independent at home with cooking, cleaning and ADLs. Has family that lives next door and helps as needed. Family is available 24/7 as needed. Has a friend who drives patient to store and any doctors appointments. Family agreed to possible home health at discharge but not interested in skilled placement.   Expected Discharge Plan: [place]     Patient Goals and CMS Choice Patient states their goals for this hospitalization and ongoing recovery are:: get back home to her family      Expected Discharge Plan and Services Expected Discharge Plan: [place]       Living arrangements for the past 2 months: Single Family Home                                      Prior Living Arrangements/Services Living arrangements for the past 2 months: Single Family Home Lives with:: Self Patient language and need for interpreter reviewed:: Yes Do you feel safe going back to the place where you live?: Yes      Need for Family Participation in Patient Care: Yes (Comment) Care giver support system in place?: Yes (comment) Current home services: DME(walker) Criminal Activity/Legal Involvement Pertinent to Current Situation/Hospitalization: No - Comment as needed  Activities of Daily Living      Permission Sought/Granted                  Emotional Assessment Appearance:: Appears stated age Attitude/Demeanor/Rapport: Engaged Affect (typically observed): Accepting Orientation: : Oriented to Self, Oriented to Place, Oriented to  Time, Oriented to Situation Alcohol / Substance Use: Never Used Psych Involvement: No  (comment)  Admission diagnosis:  Acute CHF (congestive heart failure) ([place]) [I50.9] Heart failure ([place]) [I50.9] Patient Active Problem List   Diagnosis Date Noted  . Acute CHF (congestive heart failure) ([place]) 06/05/2019  . Heart failure ([place]) 06/05/2019  . Anemia in chronic kidney disease 01/23/2019  . Benign hypertensive kidney disease with chronic kidney disease 01/23/2019  . Chronic kidney disease (CKD), stage V ([place]) 01/23/2019  . Proteinuria 01/23/2019  . Secondary hyperparathyroidism of renal origin ([place]) 01/23/2019  . Respiratory failure ([place]) 04/09/2017  . Syncopal episodes 12/15/2015  . Absolute anemia 04/14/2015  . Anxiety 04/14/2015  . Clinical depression 04/14/2015  . Essential (primary) hypertension 04/14/2015  . Gastro-esophageal reflux disease without esophagitis 04/14/2015  . Arthritis, degenerative 04/14/2015  . Allergic rhinitis 04/14/2015  . Type 2 diabetes mellitus with diabetic chronic kidney disease ([place]) 04/14/2015  . Hyperlipidemia 02/27/2015  . Glaucoma 12/23/2014  . Diabetes ([place]) 12/23/2014  . GERD (gastroesophageal reflux disease) 12/23/2014  . Essential hypertension, malignant 05/24/2013  . SOB (shortness of breath) 05/24/2013  . Chronic diastolic heart failure ([place]) 05/24/2013   PCP:  Jerrol Banana., MD Pharmacy:   [id number] - , [place] - [address] [place] [address] Phone: [phone] Fax: [phone]     Social Determinants of Health (SDOH) Interventions    Readmission  Risk Interventions No flowsheet data found.

## 2019-06-05 NOTE — Progress Notes (Signed)
Patient: Lori Stanley Female    DOB: 1931/05/12   86 y.o.   MRN: [id number] Visit Date: 06/05/2019  Today's Provider: [name], MD   Chief Complaint  Patient presents with  . Anxiety  . Shortness of Breath  . Leg Swelling   Subjective:    Virtual Visit via Telephone Note  I connected with [name] on 06/05/19 at  9:40 AM EST by telephone and verified that I am speaking with the correct person using two identifiers.  Location: Patient:  Provider:    I discussed the limitations, risks, security and privacy concerns of performing an evaluation and management service by telephone and the availability of in person appointments. I also discussed with the patient that there may be a patient responsible charge related to this service. The patient expressed understanding and agreed to proceed.   HPI Patient admitted to the hospital for CHF.  Call was not done today. Allergies  Allergen Reactions  . Antihistamines, Chlorpheniramine-Type   . Codeine   . Paxil [Paroxetine]     "makes her feel funny" not in a good way    No current facility-administered medications for this visit.  Current Outpatient Medications:  .  acetaminophen (TYLENOL) 500 MG tablet, Take 1,000 mg by mouth every 6 (six) hours as needed., Disp: , Rfl:  .  albuterol (PROVENTIL HFA;VENTOLIN HFA) 108 (90 Base) MCG/ACT inhaler, Inhale 2 puffs into the lungs every 6 (six) hours as needed for wheezing or shortness of breath., Disp: 1 Inhaler, Rfl: 0 .  amLODipine (NORVASC) 5 MG tablet, Take 1 tablet (5 mg total) by mouth daily., Disp: 90 tablet, Rfl: 3 .  ASPIRIN LOW DOSE 81 MG EC tablet, TAKE 1 TABLET (81 MG TOTAL) BY MOUTH DAILY. (Patient taking differently: Take 81 mg by mouth daily. ), Disp: 90 tablet, Rfl: 1 .  Bepotastine Besilate (BEPREVE) 1.5 % SOLN, Place 1 drop into both eyes daily., Disp: , Rfl:  .  Blood Glucose Monitoring Suppl (ONE TOUCH ULTRA MINI) w/Device KIT, 1 kit by Does  not apply route daily., Disp: 1 kit, Rfl: 0 .  brimonidine (ALPHAGAN) 0.2 % ophthalmic solution, 1 drop 2 (two) times daily. To affected eye(s), Disp: , Rfl:  .  calcitRIOL (ROCALTROL) 0.25 MCG capsule, Take 0.25 mcg by mouth every Monday, Wednesday, and Friday. , Disp: , Rfl:  .  calcitRIOL (ROCALTROL) 0.25 MCG capsule, Take 0.25 mcg by mouth daily., Disp: , Rfl:  .  carvedilol (COREG) 12.5 MG tablet, TAKE 1 TABLET (12.5 MG TOTAL) BY MOUTH 2 (TWO) TIMES DAILY., Disp: 180 tablet, Rfl: 1 .  dorzolamide-timolol (COSOPT) 22.3-6.8 MG/ML ophthalmic solution, Place 1 drop into both eyes 2 (two) times daily. , Disp: , Rfl:  .  Doxepin HCl 3 MG TABS, Take 1 tablet (3 mg total) by mouth at bedtime., Disp: 30 tablet, Rfl: 0 .  furosemide (LASIX) 40 MG tablet, TAKE 1 TABLET BY MOUTH TWICE A DAY, Disp: 180 tablet, Rfl: 1 .  Insulin Lispro Prot & Lispro (HUMALOG MIX 75/25 KWIKPEN) (75-25) 100 UNIT/ML Kwikpen, Inject 4 Units into the skin 2 (two) times daily. (Patient taking differently: Inject 2 Units into the skin 2 (two) times daily. ), Disp: 16 pen, Rfl: 12 .  Insulin Pen Needle (B-D UF III MINI PEN NEEDLES) 31G X 5 MM MISC, USE TWICE A DAY AS DIRECTED, Disp: 100 each, Rfl: 5 .  latanoprost (XALATAN) 0.005 % ophthalmic solution, Place 1 drop into  the left eye at bedtime. , Disp: , Rfl:  .  loratadine (CLARITIN) 10 MG tablet, Take 1 tablet (10 mg total) by mouth daily. (Patient not taking: Reported on 06/05/2019), Disp: 90 tablet, Rfl: 3 .  OneTouch Delica Lancets 94B MISC, USE AS DIRECTED, Disp: 100 each, Rfl: 1 .  ONETOUCH ULTRA test strip, USE AS DIRECTED, Disp: 100 strip, Rfl: 5 .  quinapril (ACCUPRIL) 40 MG tablet, TAKE 1 TABLET BY MOUTH EVERY DAY (Patient not taking: Reported on 05/23/2019), Disp: 90 tablet, Rfl: 3 .  sertraline (ZOLOFT) 25 MG tablet, TAKE 1 TABLET BY MOUTH EVERY DAY, Disp: 90 tablet, Rfl: 2 .  telmisartan (MICARDIS) 20 MG tablet, TAKE 1 TABLET BY MOUTH EVERY DAY, Disp: 90 tablet, Rfl: 3 .   Vitamin D, Ergocalciferol, (DRISDOL) 50000 units CAPS capsule, Take 50,000 Units by mouth every 7 (seven) days., Disp: , Rfl:   Facility-Administered Medications Ordered in Other Visits:  .  0.9 %  sodium chloride infusion, 250 mL, Intravenous, PRN, Mansy, Jan A, MD .  acetaminophen (TYLENOL) tablet 650 mg, 650 mg, Oral, Q4H PRN, Mansy, Jan A, MD .  amLODipine (NORVASC) tablet 5 mg, 5 mg, Oral, Daily, Mansy, Jan A, MD .  aspirin EC tablet 81 mg, 81 mg, Oral, Daily, Mansy, Jan A, MD .  brimonidine (ALPHAGAN) 0.2 % ophthalmic solution 1 drop, 1 drop, Both Eyes, BID, Mansy, Jan A, MD .  calcitRIOL (ROCALTROL) capsule 0.25 mcg, 0.25 mcg, Oral, Daily, Mansy, Jan A, MD .  calcitRIOL (ROCALTROL) capsule 0.25 mcg, 0.25 mcg, Oral, Q M,W,F, Mansy, Jan A, MD .  carvedilol (COREG) tablet 12.5 mg, 12.5 mg, Oral, BID, Mansy, Jan A, MD .  dorzolamide-timolol (COSOPT) 22.3-6.8 MG/ML ophthalmic solution 1 drop, 1 drop, Both Eyes, BID, Mansy, Jan A, MD .  Doxepin HCl TABS 3 mg, 1 tablet, Oral, QHS, Mansy, Jan A, MD .  enoxaparin (LOVENOX) injection 40 mg, 40 mg, Subcutaneous, Q24H, Mansy, Jan A, MD .  furosemide (LASIX) injection 40 mg, 40 mg, Intravenous, Q12H, Mansy, Jan A, MD .  insulin aspart (novoLOG) injection 0-9 Units, 0-9 Units, Subcutaneous, TID PC & HS, Mansy, Jan A, MD .  Insulin Lispro Prot & Lispro (HUMALOG 75/25 MIX) (75-25) 100 UNIT/ML KwikPen 2 Units, 2 Units, Subcutaneous, BID, Mansy, Jan A, MD .  latanoprost (XALATAN) 0.005 % ophthalmic solution 1 drop, 1 drop, Left Eye, QHS, Mansy, Jan A, MD .  olopatadine (PATANOL) 0.1 % ophthalmic solution 1 drop, 1 drop, Both Eyes, BID, Mansy, Jan A, MD .  ondansetron (ZOFRAN) injection 4 mg, 4 mg, Intravenous, Q6H PRN, Mansy, Arvella Merles, MD .  ONE TOUCH ULTRA MINI KIT 1 kit, 1 kit, Does not apply, Daily, Mansy, Jan A, MD .  sertraline (ZOLOFT) tablet 25 mg, 25 mg, Oral, Daily, Mansy, Jan A, MD .  sodium chloride flush (NS) 0.9 % injection 3 mL, 3 mL,  Intravenous, Q12H, Mansy, Jan A, MD .  sodium chloride flush (NS) 0.9 % injection 3 mL, 3 mL, Intravenous, PRN, Mansy, Arvella Merles, MD .  Vitamin D (Ergocalciferol) (DRISDOL) capsule 50,000 Units, 50,000 Units, Oral, Q7 days, Mansy, Jan A, MD  Review of Systems  Respiratory: Positive for shortness of breath.   Cardiovascular: Positive for leg swelling.  Psychiatric/Behavioral: The patient is nervous/anxious.     Social History   Tobacco Use  . Smoking status: Former Smoker    Types: Cigarettes  . Smokeless tobacco: Never Used  . Tobacco comment: Quit in 2015-2016  Substance  Use Topics  . Alcohol use: No      Objective:   There were no vitals taken for this visit. There were no vitals filed for this visit.There is no height or weight on file to calculate BMI.   Physical Exam   Results for orders placed or performed during the hospital encounter of 06/05/19  Respiratory Panel by RT PCR (Flu A&B, Covid) - Nasopharyngeal Swab   Specimen: Nasopharyngeal Swab  Result Value Ref Range   SARS Coronavirus 2 by RT PCR NEGATIVE NEGATIVE   Influenza A by PCR NEGATIVE NEGATIVE   Influenza B by PCR NEGATIVE NEGATIVE  Basic metabolic panel  Result Value Ref Range   Sodium 136 135 - 145 mmol/L   Potassium 4.7 3.5 - 5.1 mmol/L   Chloride 102 98 - 111 mmol/L   CO2 25 22 - 32 mmol/L   Glucose, Bld 150 (H) 70 - 99 mg/dL   BUN 61 (H) 8 - 23 mg/dL   Creatinine, Ser 3.86 (H) 0.44 - 1.00 mg/dL   Calcium 9.2 8.9 - 10.3 mg/dL   GFR calc non Af Amer 10 (L) >60 mL/min   GFR calc Af Amer 12 (L) >60 mL/min   Anion gap 9 5 - 15  CBC  Result Value Ref Range   WBC 7.1 4.0 - 10.5 K/uL   RBC 3.02 (L) 3.87 - 5.11 MIL/uL   Hemoglobin 8.1 (L) 12.0 - 15.0 g/dL   HCT 26.3 (L) 36.0 - 46.0 %   MCV 87.1 80.0 - 100.0 fL   MCH 26.8 26.0 - 34.0 pg   MCHC 30.8 30.0 - 36.0 g/dL   RDW 14.4 11.5 - 15.5 %   Platelets 266 150 - 400 K/uL   nRBC 0.0 0.0 - 0.2 %  Brain natriuretic peptide  Result Value Ref Range    B Natriuretic Peptide 577.0 (H) 0.0 - 100.0 pg/mL  CBC WITH DIFFERENTIAL  Result Value Ref Range   WBC 5.3 4.0 - 10.5 K/uL   RBC 2.66 (L) 3.87 - 5.11 MIL/uL   Hemoglobin 7.1 (L) 12.0 - 15.0 g/dL   HCT 23.6 (L) 36.0 - 46.0 %   MCV 88.7 80.0 - 100.0 fL   MCH 26.7 26.0 - 34.0 pg   MCHC 30.1 30.0 - 36.0 g/dL   RDW 14.6 11.5 - 15.5 %   Platelets 210 150 - 400 K/uL   nRBC 0.0 0.0 - 0.2 %   Neutrophils Relative % 81 %   Neutro Abs 4.3 1.7 - 7.7 K/uL   Lymphocytes Relative 14 %   Lymphs Abs 0.7 0.7 - 4.0 K/uL   Monocytes Relative 5 %   Monocytes Absolute 0.3 0.1 - 1.0 K/uL   Eosinophils Relative 0 %   Eosinophils Absolute 0.0 0.0 - 0.5 K/uL   Basophils Relative 0 %   Basophils Absolute 0.0 0.0 - 0.1 K/uL   Immature Granulocytes 0 %   Abs Immature Granulocytes 0.02 0.00 - 0.07 K/uL  Glucose, capillary  Result Value Ref Range   Glucose-Capillary 92 70 - 99 mg/dL  Troponin I (High Sensitivity)  Result Value Ref Range   Troponin I (High Sensitivity) 3 <18 ng/L       Assessment & Plan     1. Chronic diastolic heart failure ([place])   2. Essential (primary) hypertension   3. Type 2 diabetes mellitus with stage 5 chronic kidney disease not on chronic dialysis, with long-term current use of insulin ([place])   I discussed the assessment and  treatment plan with the patient. The patient was provided an opportunity to ask questions and all were answered. The patient agreed with the plan and demonstrated an understanding of the instructions.   The patient was advised to call back or seek an in-person evaluation if the symptoms worsen or if the condition fails to improve as anticipated.  I provided 0 minutes of non-face-to-face time during this encounter.     Richard Cranford Mon, MD  [facility]

## 2019-06-05 NOTE — Progress Notes (Signed)
Patient taken off BIPAP and placed on 3l [place] at this time. Patient HR 76 RR 20 oxygen saturations 94%.  RN aware.

## 2019-06-06 ENCOUNTER — Inpatient Hospital Stay: Payer: Medicare Other

## 2019-06-06 DIAGNOSIS — N184 Chronic kidney disease, stage 4 (severe): Secondary | ICD-10-CM

## 2019-06-06 DIAGNOSIS — N179 Acute kidney failure, unspecified: Secondary | ICD-10-CM

## 2019-06-06 DIAGNOSIS — D631 Anemia in chronic kidney disease: Secondary | ICD-10-CM

## 2019-06-06 LAB — BLOOD GAS, ARTERIAL
Acid-Base Excess: 2.4 mmol/L — ABNORMAL HIGH (ref 0.0–2.0)
Bicarbonate: 26.4 mmol/L (ref 20.0–28.0)
FIO2: 0.4
MECHVT: 500 mL
O2 Saturation: 98.7 %
PEEP: 5 cmH2O
Patient temperature: 37
RATE: 20 resp/min
pCO2 arterial: 38 mmHg (ref 32.0–48.0)
pH, Arterial: 7.45 (ref 7.350–7.450)
pO2, Arterial: 115 mmHg — ABNORMAL HIGH (ref 83.0–108.0)

## 2019-06-06 LAB — CBC
HCT: 25.4 % — ABNORMAL LOW (ref 36.0–46.0)
Hemoglobin: 7.5 g/dL — ABNORMAL LOW (ref 12.0–15.0)
MCH: 26.4 pg (ref 26.0–34.0)
MCHC: 29.5 g/dL — ABNORMAL LOW (ref 30.0–36.0)
MCV: 89.4 fL (ref 80.0–100.0)
Platelets: 246 10*3/uL (ref 150–400)
RBC: 2.84 MIL/uL — ABNORMAL LOW (ref 3.87–5.11)
RDW: 14.5 % (ref 11.5–15.5)
WBC: 5.5 10*3/uL (ref 4.0–10.5)
nRBC: 0 % (ref 0.0–0.2)

## 2019-06-06 LAB — CBC WITH DIFFERENTIAL/PLATELET
Abs Immature Granulocytes: 0.06 10*3/uL (ref 0.00–0.07)
Basophils Absolute: 0 10*3/uL (ref 0.0–0.1)
Basophils Relative: 0 %
Eosinophils Absolute: 0 10*3/uL (ref 0.0–0.5)
Eosinophils Relative: 0 %
HCT: 29 % — ABNORMAL LOW (ref 36.0–46.0)
Hemoglobin: 8.6 g/dL — ABNORMAL LOW (ref 12.0–15.0)
Immature Granulocytes: 1 %
Lymphocytes Relative: 7 %
Lymphs Abs: 0.4 10*3/uL — ABNORMAL LOW (ref 0.7–4.0)
MCH: 26.2 pg (ref 26.0–34.0)
MCHC: 29.7 g/dL — ABNORMAL LOW (ref 30.0–36.0)
MCV: 88.4 fL (ref 80.0–100.0)
Monocytes Absolute: 0.3 10*3/uL (ref 0.1–1.0)
Monocytes Relative: 5 %
Neutro Abs: 5.2 10*3/uL (ref 1.7–7.7)
Neutrophils Relative %: 87 %
Platelets: 267 10*3/uL (ref 150–400)
RBC: 3.28 MIL/uL — ABNORMAL LOW (ref 3.87–5.11)
RDW: 14.6 % (ref 11.5–15.5)
WBC: 6 10*3/uL (ref 4.0–10.5)
nRBC: 0 % (ref 0.0–0.2)

## 2019-06-06 LAB — BASIC METABOLIC PANEL
Anion gap: 6 (ref 5–15)
BUN: 66 mg/dL — ABNORMAL HIGH (ref 8–23)
CO2: 27 mmol/L (ref 22–32)
Calcium: 9.2 mg/dL (ref 8.9–10.3)
Chloride: 104 mmol/L (ref 98–111)
Creatinine, Ser: 3.95 mg/dL — ABNORMAL HIGH (ref 0.44–1.00)
GFR calc Af Amer: 11 mL/min — ABNORMAL LOW (ref >60)
GFR calc non Af Amer: 10 mL/min — ABNORMAL LOW (ref >60)
Glucose, Bld: 96 mg/dL (ref 70–99)
Potassium: 4.6 mmol/L (ref 3.5–5.1)
Sodium: 137 mmol/L (ref 135–145)

## 2019-06-06 LAB — COMPREHENSIVE METABOLIC PANEL
ALT: 23 U/L (ref 0–44)
AST: 26 U/L (ref 15–41)
Albumin: 3.5 g/dL (ref 3.5–5.0)
Alkaline Phosphatase: 68 U/L (ref 38–126)
Anion gap: 9 (ref 5–15)
BUN: 65 mg/dL — ABNORMAL HIGH (ref 8–23)
CO2: 25 mmol/L (ref 22–32)
Calcium: 9.6 mg/dL (ref 8.9–10.3)
Chloride: 103 mmol/L (ref 98–111)
Creatinine, Ser: 3.74 mg/dL — ABNORMAL HIGH (ref 0.44–1.00)
GFR calc Af Amer: 12 mL/min — ABNORMAL LOW (ref >60)
GFR calc non Af Amer: 10 mL/min — ABNORMAL LOW (ref >60)
Glucose, Bld: 147 mg/dL — ABNORMAL HIGH (ref 70–99)
Potassium: 4.7 mmol/L (ref 3.5–5.1)
Sodium: 137 mmol/L (ref 135–145)
Total Bilirubin: 0.6 mg/dL (ref 0.3–1.2)
Total Protein: 6.3 g/dL — ABNORMAL LOW (ref 6.5–8.1)

## 2019-06-06 LAB — BRAIN NATRIURETIC PEPTIDE: B Natriuretic Peptide: 549 pg/mL — ABNORMAL HIGH (ref 0.0–100.0)

## 2019-06-06 LAB — GLUCOSE, CAPILLARY
Glucose-Capillary: 81 mg/dL (ref 70–99)
Glucose-Capillary: 185 mg/dL — ABNORMAL HIGH (ref 70–99)
Glucose-Capillary: 122 mg/dL — ABNORMAL HIGH (ref 70–99)
Glucose-Capillary: 137 mg/dL — ABNORMAL HIGH (ref 70–99)
Glucose-Capillary: 154 mg/dL — ABNORMAL HIGH (ref 70–99)

## 2019-06-06 LAB — TROPONIN I (HIGH SENSITIVITY)
Troponin I (High Sensitivity): 3 ng/L (ref <18)
Troponin I (High Sensitivity): 3 ng/L (ref <18)
Troponin I (High Sensitivity): 4 ng/L (ref <18)

## 2019-06-06 LAB — MAGNESIUM: Magnesium: 2.5 mg/dL — ABNORMAL HIGH (ref 1.7–2.4)

## 2019-06-06 LAB — ECHOCARDIOGRAM COMPLETE

## 2019-06-06 MED ORDER — ACETAMINOPHEN 650 MG RE SUPP: 650.0000 mg | RECTAL | Status: DC | PRN

## 2019-06-06 MED ORDER — INSULIN ASPART 100 UNIT/ML ~~LOC~~ SOLN
0.0000 [IU] | SUBCUTANEOUS | Status: DC
  Administered 2019-06-06: 23:00:00 2 [IU] via SUBCUTANEOUS
  Administered 2019-06-07 (×2): 1 [IU] via SUBCUTANEOUS
  Administered 2019-06-07: 3 [IU] via SUBCUTANEOUS
  Administered 2019-06-08 (×2): 2 [IU] via SUBCUTANEOUS
  Administered 2019-06-09 – 2019-06-10 (×4): 1 [IU] via SUBCUTANEOUS
  Filled 2019-06-06 (×10): qty 1

## 2019-06-06 MED ORDER — BISACODYL 10 MG RE SUPP: 10.0000 mg | Freq: Every day | RECTAL | Status: DC | PRN

## 2019-06-06 MED ORDER — FENTANYL BOLUS VIA INFUSION
25.0000 ug | INTRAVENOUS | Status: DC | PRN
  Filled 2019-06-06: qty 25

## 2019-06-06 MED ORDER — FENTANYL CITRATE (PF) 100 MCG/2ML IJ SOLN: 25.0000 ug | Freq: Once | INTRAMUSCULAR | Status: DC

## 2019-06-06 MED ORDER — MIDAZOLAM HCL 2 MG/2ML IJ SOLN
1.0000 mg | INTRAMUSCULAR | Status: DC | PRN
  Administered 2019-06-07: 05:00:00 1 mg via INTRAVENOUS
  Filled 2019-06-06: qty 2

## 2019-06-06 MED ORDER — SIMVASTATIN 20 MG PO TABS: 40.0000 mg | ORAL_TABLET | Freq: Every day | ORAL | Status: DC

## 2019-06-06 MED ORDER — ASPIRIN 81 MG PO CHEW
81.0000 mg | CHEWABLE_TABLET | Freq: Every day | ORAL | Status: DC
  Administered 2019-06-06 – 2019-06-10 (×5): 81 mg
  Filled 2019-06-06 (×5): qty 1

## 2019-06-06 MED ORDER — FENTANYL 2500MCG IN NS 250ML (10MCG/ML) PREMIX INFUSION
0.0000 ug/h | INTRAVENOUS | Status: DC
  Administered 2019-06-06: 25 ug/h via INTRAVENOUS
  Administered 2019-06-07 – 2019-06-09 (×2): 200 ug/h via INTRAVENOUS
  Administered 2019-06-09: 100 ug/h via INTRAVENOUS
  Administered 2019-06-10: 200 ug/h via INTRAVENOUS
  Filled 2019-06-06 (×6): qty 250

## 2019-06-06 MED ORDER — FUROSEMIDE 10 MG/ML IJ SOLN
10.0000 mg/h | INTRAVENOUS | Status: DC
  Administered 2019-06-06 – 2019-06-11 (×6): 10 mg/h via INTRAVENOUS
  Filled 2019-06-06 (×7): qty 25

## 2019-06-06 MED ORDER — SIMVASTATIN 20 MG PO TABS
40.0000 mg | ORAL_TABLET | Freq: Every day | ORAL | Status: DC
  Administered 2019-06-06: 18:00:00 40 mg via ORAL
  Filled 2019-06-06: qty 2

## 2019-06-06 MED ORDER — SERTRALINE HCL 50 MG PO TABS
25.0000 mg | ORAL_TABLET | Freq: Every day | ORAL | Status: DC
  Administered 2019-06-07 – 2019-06-10 (×4): 25 mg
  Filled 2019-06-06 (×4): qty 1

## 2019-06-06 MED ORDER — CHLORHEXIDINE GLUCONATE 0.12% ORAL RINSE (MEDLINE KIT)
15.0000 mL | Freq: Two times a day (BID) | OROMUCOSAL | Status: DC
  Administered 2019-06-06 – 2019-06-11 (×10): 15 mL via OROMUCOSAL

## 2019-06-06 MED ORDER — FAMOTIDINE IN NACL 20-0.9 MG/50ML-% IV SOLN
20.0000 mg | INTRAVENOUS | Status: DC
  Administered 2019-06-06 – 2019-06-09 (×4): 20 mg via INTRAVENOUS
  Filled 2019-06-06 (×4): qty 50

## 2019-06-06 MED ORDER — CHLORHEXIDINE GLUCONATE CLOTH 2 % EX PADS
6.0000 | MEDICATED_PAD | Freq: Every day | CUTANEOUS | Status: DC
  Administered 2019-06-06 – 2019-06-16 (×10): 6 via TOPICAL

## 2019-06-06 MED ORDER — AMLODIPINE BESYLATE 5 MG PO TABS
5.0000 mg | ORAL_TABLET | Freq: Every day | ORAL | Status: DC
  Administered 2019-06-06: 5 mg
  Filled 2019-06-06 (×2): qty 1

## 2019-06-06 MED ORDER — MIDAZOLAM HCL 2 MG/2ML IJ SOLN
1.0000 mg | INTRAMUSCULAR | Status: DC | PRN
  Administered 2019-06-07: 03:00:00 1 mg via INTRAVENOUS
  Filled 2019-06-06: qty 2

## 2019-06-06 MED ORDER — HYDRALAZINE HCL 20 MG/ML IJ SOLN
10.0000 mg | INTRAMUSCULAR | Status: DC | PRN
  Administered 2019-06-06: 10 mg via INTRAVENOUS
  Administered 2019-06-07 – 2019-06-08 (×2): 20 mg via INTRAVENOUS
  Administered 2019-06-11: 10 mg via INTRAVENOUS
  Filled 2019-06-06 (×5): qty 1

## 2019-06-06 MED ORDER — ORAL CARE MOUTH RINSE
15.0000 mL | OROMUCOSAL | Status: DC
  Administered 2019-06-06 – 2019-06-11 (×41): 15 mL via OROMUCOSAL

## 2019-06-06 MED ORDER — VITAMIN B-12 1000 MCG PO TABS
1000.0000 ug | ORAL_TABLET | Freq: Every day | ORAL | Status: DC
  Administered 2019-06-07 – 2019-06-10 (×4): 1000 ug
  Filled 2019-06-06 (×4): qty 1

## 2019-06-06 MED ORDER — VITAMIN B-12 1000 MCG PO TABS
1000.0000 ug | ORAL_TABLET | Freq: Every day | ORAL | Status: DC
  Administered 2019-06-06: 11:00:00 1000 ug via ORAL
  Filled 2019-06-06: qty 1

## 2019-06-06 MED ORDER — FUROSEMIDE 10 MG/ML IJ SOLN
80.0000 mg | Freq: Once | INTRAMUSCULAR | Status: AC
  Administered 2019-06-06: 80 mg via INTRAVENOUS
  Filled 2019-06-06: qty 8

## 2019-06-06 MED ORDER — CARVEDILOL 12.5 MG PO TABS
12.5000 mg | ORAL_TABLET | Freq: Two times a day (BID) | ORAL | Status: DC
  Administered 2019-06-06 – 2019-06-10 (×7): 12.5 mg
  Filled 2019-06-06 (×7): qty 1

## 2019-06-06 NOTE — Progress Notes (Signed)
[facility]  ROUNDING NOTE   Subjective:   [name] [facility] admitted to [facility] on 06/05/2019 for SOB (shortness of breath) [R06.02] Acute CHF (congestive heart failure) ([place]) [I50.9] Acute respiratory failure with hypoxia ([place]) [J96.01] Heart failure ([place]) [I50.9] Acute on chronic congestive heart failure, unspecified heart failure type ([place]) [I50.9]  Placed on furosemide gtt.   Last seen in nephrology clinic on 04/30/19 where she states she wanted to wait on a decision to start dialysis.   Objective:  Vital signs in last 24 hours:  Temp:  [97.5 F (36.4 C)-98.3 F (36.8 C)] 98.1 F (36.7 C) (02/04 1658) Pulse Rate:  [74-87] 87 (02/04 1658) Resp:  [15-25] 17 (02/04 1658) BP: (142-179)/(62-82) 179/75 (02/04 1658) SpO2:  [82 %-100 %] 96 % (02/04 1700) Weight:  [70.4 kg] 70.4 kg (02/04 0418)  Weight change:  Filed Weights   06/06/19 0418  Weight: 70.4 kg    Intake/Output: I/O last 3 completed shifts: In: 240 [P.O.:240] Out: 500 [Urine:500]   Intake/Output this shift:  Total I/O In: 480 [P.O.:480] Out: 500 [Urine:500]  Physical Exam: General: NAD,   Head: Normocephalic, atraumatic. Moist oral mucosal membranes  Eyes: Anicteric, PERRL  Neck: Supple, trachea midline  Lungs:  crackles  Heart: Regular rate and rhythm  Abdomen:  Soft, nontender,   Extremities:  ++ peripheral edema.  Neurologic: Nonfocal, moving all four extremities  Skin: No lesions  Access: none    Basic Metabolic Panel: Recent Labs  Lab 06/05/19 0102 06/06/19 0621 06/06/19 0913  NA 136 137  --   K 4.7 4.6  --   CL 102 104  --   CO2 25 27  --   GLUCOSE 150* 96  --   BUN 61* 66*  --   CREATININE 3.86* 3.95*  --   CALCIUM 9.2 9.2  --   MG  --   --  2.5*    Liver Function Tests: Recent Labs  Lab 06/05/19 0102  AST 26  ALT 19  ALKPHOS 69  BILITOT 0.6  PROT 6.7  ALBUMIN 3.6   No results for input(s): LIPASE, AMYLASE in the last 168 hours. No results for input(s):  AMMONIA in the last 168 hours.  CBC: Recent Labs  Lab 06/05/19 0102 06/05/19 0647 06/06/19 0913  WBC 7.1 5.3 5.5  NEUTROABS  --  4.3  --   HGB 8.1* 7.1* 7.5*  HCT 26.3* 23.6* 25.4*  MCV 87.1 88.7 89.4  PLT 266 210 246    Cardiac Enzymes: No results for input(s): CKTOTAL, CKMB, CKMBINDEX, TROPONINI in the last 168 hours.  BNP: Invalid input(s): POCBNP  CBG: Recent Labs  Lab 06/05/19 1720 06/05/19 2111 06/06/19 0728 06/06/19 1130 06/06/19 1657  GLUCAP 98 174* 81 185* 122*    Microbiology: Results for orders placed or performed during the hospital encounter of 06/05/19  Respiratory Panel by RT PCR (Flu A&B, Covid) - Nasopharyngeal Swab     Status: None   Collection Time: 06/05/19  2:27 AM   Specimen: Nasopharyngeal Swab  Result Value Ref Range Status   SARS Coronavirus 2 by RT PCR NEGATIVE NEGATIVE Final    Comment: (NOTE) SARS-CoV-2 target nucleic acids are NOT DETECTED. The SARS-CoV-2 RNA is generally detectable in upper respiratoy specimens during the acute phase of infection. The lowest concentration of SARS-CoV-2 viral copies this assay can detect is 131 copies/mL. A negative result does not preclude SARS-Cov-2 infection and should not be used as the sole basis for treatment or other  patient management decisions. A negative result may occur with  improper specimen collection/handling, submission of specimen other than nasopharyngeal swab, presence of viral mutation(s) within the areas targeted by this assay, and inadequate number of viral copies (<131 copies/mL). A negative result must be combined with clinical observations, patient history, and epidemiological information. The expected result is Negative. Fact Sheet for Patients:  PinkCheek.be Fact Sheet for Healthcare Providers:  GravelBags.it This test is not yet ap proved or cleared by the [place] FDA and  has been authorized for detection  and/or diagnosis of SARS-CoV-2 by FDA under an Emergency Use Authorization (EUA). This EUA will remain  in effect (meaning this test can be used) for the duration of the COVID-19 declaration under Section 564(b)(1) of the Act, 21 U.S.C. section 360bbb-3(b)(1), unless the authorization is terminated or revoked sooner.    Influenza A by PCR NEGATIVE NEGATIVE Final   Influenza B by PCR NEGATIVE NEGATIVE Final    Comment: (NOTE) The Xpert Xpress SARS-CoV-2/FLU/RSV assay is intended as an aid in  the diagnosis of influenza from Nasopharyngeal swab specimens and  should not be used as a sole basis for treatment. Nasal washings and  aspirates are unacceptable for Xpert Xpress SARS-CoV-2/FLU/RSV  testing. Fact Sheet for Patients: PinkCheek.be Fact Sheet for Healthcare Providers: GravelBags.it This test is not yet approved or cleared by the [place] FDA and  has been authorized for detection and/or diagnosis of SARS-CoV-2 by  FDA under an Emergency Use Authorization (EUA). This EUA will remain  in effect (meaning this test can be used) for the duration of the  Covid-19 declaration under Section 564(b)(1) of the Act, 21  U.S.C. section 360bbb-3(b)(1), unless the authorization is  terminated or revoked. Performed at [facility], [address] [place], [place] [address]     Coagulation Studies: No results for input(s): LABPROT, INR in the last 72 hours.  Urinalysis: No results for input(s): COLORURINE, LABSPEC, PHURINE, GLUCOSEU, HGBUR, BILIRUBINUR, KETONESUR, PROTEINUR, UROBILINOGEN, NITRITE, LEUKOCYTESUR in the last 72 hours.  Invalid input(s): APPERANCEUR    Imaging: DG Chest 1 View  Result Date: 06/05/2019 CLINICAL DATA:  Shortness of breath EXAM: CHEST  1 VIEW COMPARISON:  Film from earlier in the same day. FINDINGS: Cardiac shadow is stable. Aortic calcifications are again seen. Poor inspiratory effort is  seen. Persistent vascular congestion is noted with small left pleural effusion again consistent with mild CHF. IMPRESSION: The overall appearance of the chest is stable with changes mild CHF. New left pleural effusion is seen. Electronically Signed   By: Inez Catalina M.D.   On: 06/05/2019 20:13   DG Chest Port 1 View  Result Date: 06/05/2019 CLINICAL DATA:  Respiratory distress EXAM: PORTABLE CHEST 1 VIEW COMPARISON:  04/18/2017 FINDINGS: Cardiac shadow is mildly prominent but accentuated by the portable technique. Vascular congestion increased interstitial markings are noted. No sizable effusion is seen. No bony is noted. IMPRESSION: Changes consistent with early CHF. Electronically Signed   By: Inez Catalina M.D.   On: 06/05/2019 01:17     Medications:   . sodium chloride    . furosemide (LASIX) infusion 10 mg/hr (06/06/19 0554)   . aspirin EC  81 mg Oral Daily  . brimonidine  1 drop Both Eyes BID  . calcitRIOL  0.25 mcg Oral Daily  . calcitRIOL  0.25 mcg Oral Q M,W,F  . carvedilol  12.5 mg Oral BID  . dorzolamide-timolol  1 drop Both Eyes BID  . doxepin  10 mg Oral  QHS  . heparin injection (subcutaneous)  5,000 Units Subcutaneous Q8H  . insulin aspart  0-9 Units Subcutaneous TID WC  . ipratropium-albuterol  3 mL Nebulization QID  . latanoprost  1 drop Left Eye QHS  . olopatadine  1 drop Both Eyes BID  . sertraline  25 mg Oral Daily  . simvastatin  40 mg Oral q1800  . sodium chloride flush  3 mL Intravenous Q12H  . vitamin B-12  1,000 mcg Oral Daily  . [START ON 06/07/2019] Vitamin D (Ergocalciferol)  50,000 Units Oral Q7 days   sodium chloride, acetaminophen, hydrOXYzine, ondansetron (ZOFRAN) IV, sodium chloride flush  Assessment/ Plan:   Ms. Bona is an 86 y.o. year old black female with diabetes mellitus type II, hypertension, anemia, GERD, depression/anxiety, glaucoma, hyperlipidemia, diastolic congestive heart failure, coronary artery disease, who is admitted to [facility] on  06/05/2019 for SOB (shortness of breath) [R06.02] Acute CHF (congestive heart failure) ([place]) [I50.9] Acute respiratory failure with hypoxia ([place]) [J96.01] Heart failure ([place]) [I50.9] Acute on chronic congestive heart failure, unspecified heart failure type ([place]) [I50.9]   1. Acute renal failure on Chronic kidney disease stage V with proteinuria and hematuria:Baseline creatinine of 3.2, GFR of 15 on 04/10/2019.  Patient is willing to do dialysis.  No acute indication for dialysis at this time. However if furosemide does not work, will consider.  - holding telmisartan   2. Hypertension: Elevated. Home regimen of telmisartan, furosemide, carvedilol and amlodipine. With acute exacerbation of CHF - Furosemide bolus and gtt  3. Diabetes mellitus type II with chronic kidney disease: insulin dependent. Hemoglobin A1c of 6% 04/10/2019.  4. Anemia with chronic kidney disease: hemoglobin 7.5 normocytic.  - Discussed role of EPO.  5. Secondary Hyperparathyroidism: - calcitriol    LOS: 1 Shirline Kendle 2/4/20215:57 PM

## 2019-06-06 NOTE — Progress Notes (Signed)
Patient oxygen saturations 80-85% on 5L [place]. O2 increased to 6L, patient still saturating in 80s. Duo neb and IV lasix given. Saturations increased to 97% during duo neb administration. After patient was taking off duo neb and placed back on oxygen, saturations dropped to 88%. RT notified for HFNC. Per RT, she is unavailable at this time but will bring as soon as she is available. Patient placed on NRB. Sats increased to 94-98%.   Update: Patient transitioned to 10L HFNC. NP Ouma at bedside.  Update: Per Ouma, we will not start IV lasix at this time. Patient has only voided 50 cc. Bladder scan performed, 28 cc shown. NP ouma aware. No new orders.   Update: Patient placed back on 4L [place]. Saturations 94-97%.   Update: Per Ouma, we will restart lasix gtt due to poor urine output.

## 2019-06-06 NOTE — ED Provider Notes (Signed)
[facility] Department of Emergency Medicine   Code Blue CONSULT NOTE  Chief Complaint: Cardiac arrest/unresponsive   Level V Caveat: Unresponsive  History of present illness: I was contacted by the hospital for a CODE BLUE cardiac arrest upstairs and presented to the patient's bedside.  Patient found to be with profound hypoxic respiratory failure on nonrebreather satting only 60%.  Looks to BUN flash pulmonary edema.  Team has been caring for her in the hospital reports persistently worsening acidemia and hypoxia despite Lasix drip.  Patient protecting her airway but encephalopathic in the setting of her hypoxia.  ROS: Unable to obtain, Level V caveat  Scheduled Meds: . aspirin EC  81 mg Oral Daily  . brimonidine  1 drop Both Eyes BID  . calcitRIOL  0.25 mcg Oral Daily  . carvedilol  12.5 mg Oral BID  . chlorhexidine gluconate (MEDLINE KIT)  15 mL Mouth Rinse BID  . Chlorhexidine Gluconate Cloth  6 each Topical Daily  . dorzolamide-timolol  1 drop Both Eyes BID  . doxepin  10 mg Oral QHS  . heparin injection (subcutaneous)  5,000 Units Subcutaneous Q8H  . insulin aspart  0-9 Units Subcutaneous TID WC  . ipratropium-albuterol  3 mL Nebulization QID  . latanoprost  1 drop Left Eye QHS  . mouth rinse  15 mL Mouth Rinse 10 times per day  . olopatadine  1 drop Both Eyes BID  . sertraline  25 mg Oral Daily  . simvastatin  40 mg Oral q1800  . sodium chloride flush  3 mL Intravenous Q12H  . vitamin B-12  1,000 mcg Oral Daily  . [START ON 06/07/2019] Vitamin D (Ergocalciferol)  50,000 Units Oral Q7 days   Continuous Infusions: . sodium chloride    . furosemide (LASIX) infusion 10 mg/hr (06/06/19 1757)   PRN Meds:.sodium chloride, acetaminophen, ondansetron (ZOFRAN) IV, sodium chloride flush Past Medical History:  Diagnosis Date  . Acute heart failure ([place])   . Chronic low back pain   . Diabetes mellitus without complication ([place])   . Hyperlipidemia   .  Hypertension   . MI (myocardial infarction) ([place])   . Obesity    Past Surgical History:  Procedure Laterality Date  . EYE SURGERY    . KNEE SURGERY    . THYROID SURGERY    . VAGINAL HYSTERECTOMY     Social History   Socioeconomic History  . Marital status: Widowed    Spouse name: Not on file  . Number of children: 1  . Years of education: Not on file  . Highest education level: 11th grade  Occupational History  . Occupation: retired  Tobacco Use  . Smoking status: Former Smoker    Types: Cigarettes  . Smokeless tobacco: Never Used  . Tobacco comment: Quit in 2015-2016  Substance and Sexual Activity  . Alcohol use: No  . Drug use: No  . Sexual activity: Never  Other Topics Concern  . Not on file  Social History Narrative  . Not on file   Social Determinants of Health   Financial Resource Strain: Low Risk   . Difficulty of Paying Living Expenses: Not hard at all  Food Insecurity: No Food Insecurity  . Worried About Charity fundraiser in the Last Year: Never true  . Ran Out of Food in the Last Year: Never true  Transportation Needs: No Transportation Needs  . Lack of Transportation (Medical): No  . Lack of Transportation (Non-Medical): No  Physical Activity: Inactive  .  Days of Exercise per Week: 0 days  . Minutes of Exercise per Session: 0 min  Stress: No Stress Concern Present  . Feeling of Stress : Only a little  Social Connections: Somewhat Isolated  . Frequency of Communication with Friends and Family: More than three times a week  . Frequency of Social Gatherings with Friends and Family: Once a week  . Attends Religious Services: More than 4 times per year  . Active Member of Clubs or Organizations: No  . Attends Archivist Meetings: Never  . Marital Status: Widowed  Intimate Partner Violence: Not At Risk  . Fear of Current or Ex-Partner: No  . Emotionally Abused: No  . Physically Abused: No  . Sexually Abused: No   Allergies  Allergen  Reactions  . Antihistamines, Chlorpheniramine-Type   . Codeine   . Paxil [Paroxetine]     "makes her feel funny" not in a good way    Last set of Vital Signs (not current) Vitals:   06/06/19 1700 06/06/19 1954  BP:  (!) 127/57  Pulse:  73  Resp:    Temp:    SpO2: 96% (!) 64%      Physical Exam  Gen: alert, but in acute respiratory distress Cardiovascular: strong radial pulse Resp: spontaneous breaths, diminished bilateral BS Abd: nondistended  Neuro: able to nod yes and no HEENT: No blood in posterior pharynx,  Neck: No crepitus  Musculoskeletal: No deformity  Skin: warm  Procedures (when applicable, including Critical Care time): Procedure Name: Intubation Date/Time: 06/06/2019 8:14 PM Performed by: Merlyn Lot, MD Pre-anesthesia Checklist: Patient identified, Patient being monitored, Emergency Drugs available, Timeout performed and Suction available Oxygen Delivery Method: Non-rebreather mask Preoxygenation: Pre-oxygenation with 100% oxygen Induction Type: Rapid sequence Ventilation: Mask ventilation without difficulty Laryngoscope Size: Glidescope and 3 Grade View: Grade II Tube size: 7.5 mm Number of attempts: 1 Airway Equipment and Method: Video-laryngoscopy Placement Confirmation: ETT inserted through vocal cords under direct vision,  CO2 detector,  Breath sounds checked- equal and bilateral and Positive ETCO2        MDM / Assessment and Plan With evidence of acute hypoxic respiratory failure requiring intubation.  With nonrebreather and bag-valve-mask were unable to get her O2 saturations up to 91% from 64%.  Intubated and we were able to get her O2 sats up to 100.  Markedly hypertensive.  Suspect flash pulmonary edema.  Patient transferred to ICU for further medical care.    Merlyn Lot, MD 06/06/19 2015

## 2019-06-06 NOTE — Progress Notes (Signed)
Progress Note  Patient Name: Lori Stanley Date of Encounter: 06/06/2019  Primary Cardiologist:  Dr. Fletcher Anon  Subjective   Pending yesterday's echo results.  No chest pain, racing HR, palpitations today.    Still on [place] oxygen. Reports significant improvement in her breathing.  Also has noticed significant improvement in her abdominal and lower extremity edema.  Reviewed AM labs with her.  Inpatient Medications    Scheduled Meds: . aspirin EC  81 mg Oral Daily  . brimonidine  1 drop Both Eyes BID  . calcitRIOL  0.25 mcg Oral Daily  . calcitRIOL  0.25 mcg Oral Q M,W,F  . carvedilol  12.5 mg Oral BID  . dorzolamide-timolol  1 drop Both Eyes BID  . Doxepin HCl  1 tablet Oral QHS  . heparin injection (subcutaneous)  5,000 Units Subcutaneous Q8H  . insulin aspart  0-9 Units Subcutaneous TID WC  . ipratropium-albuterol  3 mL Nebulization QID  . latanoprost  1 drop Left Eye QHS  . olopatadine  1 drop Both Eyes BID  . sertraline  25 mg Oral Daily  . sodium chloride flush  3 mL Intravenous Q12H  . vitamin B-12  1,000 mcg Oral Daily  . [START ON 06/07/2019] Vitamin D (Ergocalciferol)  50,000 Units Oral Q7 days   Continuous Infusions: . sodium chloride    . furosemide (LASIX) infusion 10 mg/hr (06/06/19 0554)   PRN Meds: sodium chloride, acetaminophen, hydrOXYzine, ondansetron (ZOFRAN) IV, sodium chloride flush   Vital Signs    Vitals:   06/05/19 2325 06/06/19 0418 06/06/19 0430 06/06/19 0729  BP:  (!) 160/72  (!) 155/82  Pulse:  74  76  Resp:  20  15  Temp:  98.1 F (36.7 C)  98.2 F (36.8 C)  TempSrc:  Oral  Oral  SpO2: 100% 99% 97% 100%  Weight:  70.4 kg    Height:        Intake/Output Summary (Last 24 hours) at 06/06/2019 1057 Last data filed at 06/06/2019 0900 Gross per 24 hour  Intake 480 ml  Output 650 ml  Net -170 ml   Last 3 Weights 06/06/2019 04/10/2019 03/11/2019  Weight (lbs) 155 lb 4.8 oz 137 lb 135 lb  Weight (kg) 70.444 kg 62.143 kg 61.236 kg       Telemetry    60s-80s - Personally Reviewed  ECG    No new tracings - Personally Reviewed  Physical Exam   GEN: No acute distress.   Neck: JVD difficult to assess 2/2 body habitus Cardiac: RRR, 1/6 systolic murmur. No rubs, or gallops.  Respiratory: [place] in place. Coarse breath sounds bilaterally GI: Soft, nontender, non-distended  MS: 1-2+ b/l edema; No deformity. Neuro:  Nonfocal  Psych: Normal affect   Labs    High Sensitivity Troponin:   Recent Labs  Lab 06/05/19 0102 06/05/19 0227 06/06/19 0621  TROPONINIHS 3 4 3       Cardiac EnzymesNo results for input(s): TROPONINI in the last 168 hours. No results for input(s): TROPIPOC in the last 168 hours.   Chemistry Recent Labs  Lab 06/05/19 0102 06/06/19 0621  NA 136 137  K 4.7 4.6  CL 102 104  CO2 25 27  GLUCOSE 150* 96  BUN 61* 66*  CREATININE 3.86* 3.95*  CALCIUM 9.2 9.2  PROT 6.7  --   ALBUMIN 3.6  --   AST 26  --   ALT 19  --   ALKPHOS 69  --   BILITOT 0.6  --  GFRNONAA 10* 10*  GFRAA 12* 11*  ANIONGAP 9 6     Hematology Recent Labs  Lab 06/05/19 0102 06/05/19 0647 06/06/19 0913  WBC 7.1 5.3 5.5  RBC 3.02* 2.66* 2.84*  HGB 8.1* 7.1* 7.5*  HCT 26.3* 23.6* 25.4*  MCV 87.1 88.7 89.4  MCH 26.8 26.7 26.4  MCHC 30.8 30.1 29.5*  RDW 14.4 14.6 14.5  PLT 266 210 246    BNP Recent Labs  Lab 06/05/19 0102  BNP 577.0*     DDimer No results for input(s): DDIMER in the last 168 hours.   Radiology    DG Chest 1 View  Result Date: 06/05/2019 CLINICAL DATA:  Shortness of breath EXAM: CHEST  1 VIEW COMPARISON:  Film from earlier in the same day. FINDINGS: Cardiac shadow is stable. Aortic calcifications are again seen. Poor inspiratory effort is seen. Persistent vascular congestion is noted with small left pleural effusion again consistent with mild CHF. IMPRESSION: The overall appearance of the chest is stable with changes mild CHF. New left pleural effusion is seen. Electronically Signed   By:  Inez Catalina M.D.   On: 06/05/2019 20:13   DG Chest Port 1 View  Result Date: 06/05/2019 CLINICAL DATA:  Respiratory distress EXAM: PORTABLE CHEST 1 VIEW COMPARISON:  04/18/2017 FINDINGS: Cardiac shadow is mildly prominent but accentuated by the portable technique. Vascular congestion increased interstitial markings are noted. No sizable effusion is seen. No bony is noted. IMPRESSION: Changes consistent with early CHF. Electronically Signed   By: Inez Catalina M.D.   On: 06/05/2019 01:17    Cardiac Studies   Pending updated echo.  Echo 04/11/2017 - Left ventricle: The cavity size was normal. There was mild  concentric hypertrophy. Systolic function was normal. The  estimated ejection fraction was in the range of 60% to 65%. Wall  motion was normal; there were no regional wall motion  abnormalities. Doppler parameters are consistent with abnormal  left ventricular relaxation (grade 1 diastolic dysfunction).  - Mitral valve: Calcified annulus. There was mild regurgitation.  - Left atrium: The atrium was mildly dilated.  - Right ventricle: Systolic function was normal.  - Pulmonary arteries: Systolic pressure was within the normal  range.   05/30/2013 NM Myoview No significant wall motion abnormality noted.  Overall, low risk scan.  No significant ischemia.  EF 64%.  LV global function normal.  No EKG changes concerning for ischemia.  Attenuation artifact noted in the study.  Patient Profile     86 y.o. female hx of chronic diastolic heart failure, hypertension, DM2, CKD, and who is being seen today for the evaluation of acute on chronic diastolic heart failure.   Assessment & Plan    Acute on chronic diastolic heart failure -Improved breathing and LEE. Pending updated echo to reassess EF and rule out acute structural abnormalities. Loud murmur on exam. 2018 echo showed mild MR and nl pump function. Consider worsening valvular disease as contributing to shortness of breath  as below.  --Escalation of IV diuresis currently complicated by AOCKD and as Cr continues to climb on gentle diuresis. Consider nephrology consult - she follows with [facility] as an outpatient. Continue to monitor daily BMET to monitor renal function and electrolytes, daily weights, I/os. -260cc yesterday. Continue BB as HR allows for BP support. Amlodipine was stopped yesterday. Consider restart of amlodipine or could also consider Imdur/hydralazine combination for BP support. Further recommendations pending echocardiogram.  If EF reduced, will need to consider further outpatient ischemic work-up.  If  IV diuresis continues to be complicated by renal function, consider RHC for optimal understanding of hemodynamics and cardiac pressures. CHF education including low salt diet reviewed with patient. Continue oxygen and wean as tolerated. Adding back statin as below. She is not sure why it was stopped previously and denied intolerance.   Hypertension --Still poorly controlled. Continue current medications. Titrate as HR allows for optimal BP support. Consider alternative antihypertensives, such as PTA amlodipine or Imdur/hydralazine. Current renal function precludes the addition of ACE/ARB/ARNI/MRA. Recommend nephrology consult.  Hyperlipidemia --Previously on simvastatin 40 mg once daily, which appears to have fallen off of her medications last.  10/2018 LDL 163 with total cholesterol 233. Restarted statin with current ALT 19 and recheck of LFTs and lipids in 6 to 8 weeks.  DM2 --Recommend strict glycemic control for risk factor modification.  Currently not well controlled.  04/2019 HgbA1c 6.0.  Likely contributing to poor renal function. SSI.  Per IM.  AOCKD --Last creatinine 3.95 with BUN 66 and elevated from yesterday and baseline.  Consider as contributing to LEE. Recommend nephrology consult.  Consider poorly controlled diabetes as contributing to current renal function.  Avoid nephrotoxins, including  contrast studies.  No ACE/ARB/ARNI/MRA given renal function. Also noted is her anemia, likely anemia of chronic dz; however, recommend further workup to r/o additional etiology given currently 7.5.  Anemia of unknown Etiology, possible anemia of chronic dz --As above, consider iron studies given hemoglobin 7.5.  Also considered is anemia of chronic disease.  Consider nephrology consult as above.  For questions or updates, please contact [place] Please consult [email or web address] for contact info under        Signed, Arvil Chaco, PA-C  06/06/2019, 10:57 AM

## 2019-06-06 NOTE — Plan of Care (Signed)
  Problem: Education: Goal: Ability to demonstrate management of disease process will improve Outcome: Progressing Goal: Ability to verbalize understanding of medication therapies will improve Outcome: Progressing   Problem: Activity: Goal: Capacity to carry out activities will improve Outcome: Progressing   Problem: Cardiac: Goal: Ability to achieve and maintain adequate cardiopulmonary perfusion will improve Outcome: Progressing   

## 2019-06-06 NOTE — Progress Notes (Signed)
PROGRESS NOTE    Lori Stanley  [id number] DOB: 08-05-1931 DOA: 06/05/2019 PCP: Jerrol Banana., MD    Assessment & Plan:   Active Problems:   Acute CHF (congestive heart failure) (HCC)   Heart failure (HCC)   Edema of both lower extremities    Lori Stanley  is a 86 y.o. female with a known history of CHF, hypertension, dyslipidemia and coronary artery disease, presented to the emergency room with an onset of worsening dyspnea over the last few days with associated dry cough as well as worsening lower extremity edema, orthopnea and occasional paroxysmal nocturnal dyspnea.   Upon presentation to the emergency room, blood pressure was 166/65 with respirate of 28 and pulse symmetry was 81% on 2 L of O2 by nasal cannula when he was placed on BiPAP with pulse oximetry improvement 100%.  Labs revealed a BUN of 61 and creatinine 3.86 compared to 40 and 3.2 on 04/10/2019.  BNP was 577 and high-sensitivity troponin was 3.  COVID-19 PCR came back negative.  Chest x-ray showed early CHF.  Influenza antigens came back negative.  EKG showed normal sinus rhythm with rate of 67 with probable left atrial enlargement  # Acute on chronic diastolic CHF with subsequent acute hypoxic respiratory failure.   --patient had a 2D echo in 2016 revealing an EF of 60 to 65% with grade 1 diastolic dysfunction, mild mitral regurgitation and mild left atrial dilatation.   --Was on BiPAP and weaned down to 4L [place] --Received IV lasix 40 mg in the ED PLAN: --IV Lasix 80 mg x1 today, while continuing lasix gtt@10  --Strict I/O --TTE  # Acute renal failure on Chronic kidney disease stage V with proteinuria and hematuria Baseline creatinine of 3.2, GFR of 15 on 04/10/2019 This is likely prerenal secondary to acute CHF and subsequent renal hypoperfusion.   --Diurese as above --Nephrology following --Hold home telmisartan  3.  Hypertension.   continue Coreg and amlodipine and hold off Avapro given acute  kidney injury.  4.  Type 2 diabetes mellitus.  We will place the patient on supplemental coverage with NovoLog and continue basal coverage.  5.  Glaucoma.  We will continue ophthalmic GTT.  Anxiety --continue home Zoloft --Atarax nightly PRN  Anemia with chronic kidney disease: hemoglobin 7.5 normocytic --EPO per nephrology  Secondary Hyperparathyroidism: - calcitriol   DVT prophylaxis: Heparin SQ Code Status: Full code  Family Communication: not today Disposition Plan: Home or rehab (pt lives alone)   Subjective and Interval History:  Overnight, pt had increased O2 requirement and respiratory distress, ABG showed hypercapnia, pt was put on BiPAP with improvement.  This morning, pt was back on 4L [place], and reported feeling better.  No fever, chest pain, abdominal pain, N/V/D, dysuria.  Pt complained to nursing of feeling anxious.   Objective: Vitals:   06/06/19 0430 06/06/19 0729 06/06/19 1132 06/06/19 1658  BP:  (!) 155/82 (!) 142/62 (!) 179/75  Pulse:  76 76 87  Resp:  15 18 17   Temp:  98.2 F (36.8 C) 98.3 F (36.8 C) 98.1 F (36.7 C)  TempSrc:  Oral Oral Oral  SpO2: 97% 100% 94% 96%  Weight:      Height:        Intake/Output Summary (Last 24 hours) at 06/06/2019 1726 Last data filed at 06/06/2019 1300 Gross per 24 hour  Intake 480 ml  Output 600 ml  Net -120 ml   Filed Weights   06/06/19 0418  Weight: 70.4 kg  Examination:   Constitutional: NAD, AAOx3 HEENT: conjunctivae and lids normal, EOMI CV: RRR. Distal pulses +2.  No cyanosis.   RESP: Reduced lung sounds, on 4L GI: +BS, NTND Extremities: Swelling in BLE improved SKIN: warm, dry and intact Neuro: II - XII grossly intact.  Sensation intact Psych: Normal mood and affect.  Appropriate judgement and reason   Data Reviewed: I have personally reviewed following labs and imaging studies  CBC: Recent Labs  Lab 06/05/19 0102 06/05/19 0647 06/06/19 0913  WBC 7.1 5.3 5.5  NEUTROABS  --  4.3  --    HGB 8.1* 7.1* 7.5*  HCT 26.3* 23.6* 25.4*  MCV 87.1 88.7 89.4  PLT 266 210 0000000   Basic Metabolic Panel: Recent Labs  Lab 06/05/19 0102 06/06/19 0621 06/06/19 0913  NA 136 137  --   K 4.7 4.6  --   CL 102 104  --   CO2 25 27  --   GLUCOSE 150* 96  --   BUN 61* 66*  --   CREATININE 3.86* 3.95*  --   CALCIUM 9.2 9.2  --   MG  --   --  2.5*   GFR: Estimated Creatinine Clearance: 9.8 mL/min (A) (by C-G formula based on SCr of 3.95 mg/dL (H)). Liver Function Tests: Recent Labs  Lab 06/05/19 0102  AST 26  ALT 19  ALKPHOS 69  BILITOT 0.6  PROT 6.7  ALBUMIN 3.6   No results for input(s): LIPASE, AMYLASE in the last 168 hours. No results for input(s): AMMONIA in the last 168 hours. Coagulation Profile: No results for input(s): INR, PROTIME in the last 168 hours. Cardiac Enzymes: No results for input(s): CKTOTAL, CKMB, CKMBINDEX, TROPONINI in the last 168 hours. BNP (last 3 results) No results for input(s): PROBNP in the last 8760 hours. HbA1C: No results for input(s): HGBA1C in the last 72 hours. CBG: Recent Labs  Lab 06/05/19 1720 06/05/19 2111 06/06/19 0728 06/06/19 1130 06/06/19 1657  GLUCAP 98 174* 81 185* 122*   Lipid Profile: No results for input(s): CHOL, HDL, LDLCALC, TRIG, CHOLHDL, LDLDIRECT in the last 72 hours. Thyroid Function Tests: Recent Labs    06/05/19 0102  TSH 3.498   Anemia Panel: Recent Labs    06/05/19 1422  VITAMINB12 161*  FOLATE 9.0  TIBC 322  IRON 40   Sepsis Labs: No results for input(s): PROCALCITON, LATICACIDVEN in the last 168 hours.  Recent Results (from the past 240 hour(s))  Respiratory Panel by RT PCR (Flu A&B, Covid) - Nasopharyngeal Swab     Status: None   Collection Time: 06/05/19  2:27 AM   Specimen: Nasopharyngeal Swab  Result Value Ref Range Status   SARS Coronavirus 2 by RT PCR NEGATIVE NEGATIVE Final    Comment: (NOTE) SARS-CoV-2 target nucleic acids are NOT DETECTED. The SARS-CoV-2 RNA is generally  detectable in upper respiratoy specimens during the acute phase of infection. The lowest concentration of SARS-CoV-2 viral copies this assay can detect is 131 copies/mL. A negative result does not preclude SARS-Cov-2 infection and should not be used as the sole basis for treatment or other patient management decisions. A negative result may occur with  improper specimen collection/handling, submission of specimen other than nasopharyngeal swab, presence of viral mutation(s) within the areas targeted by this assay, and inadequate number of viral copies (<131 copies/mL). A negative result must be combined with clinical observations, patient history, and epidemiological information. The expected result is Negative. Fact Sheet for Patients:  PinkCheek.be Fact Sheet  for Healthcare Providers:  GravelBags.it This test is not yet ap proved or cleared by the [place] and  has been authorized for detection and/or diagnosis of SARS-CoV-2 by FDA under an Emergency Use Authorization (EUA). This EUA will remain  in effect (meaning this test can be used) for the duration of the COVID-19 declaration under Section 564(b)(1) of the Act, 21 U.S.C. section 360bbb-3(b)(1), unless the authorization is terminated or revoked sooner.    Influenza A by PCR NEGATIVE NEGATIVE Final   Influenza B by PCR NEGATIVE NEGATIVE Final    Comment: (NOTE) The Xpert Xpress SARS-CoV-2/FLU/RSV assay is intended as an aid in  the diagnosis of influenza from Nasopharyngeal swab specimens and  should not be used as a sole basis for treatment. Nasal washings and  aspirates are unacceptable for Xpert Xpress SARS-CoV-2/FLU/RSV  testing. Fact Sheet for Patients: PinkCheek.be Fact Sheet for Healthcare Providers: GravelBags.it This test is not yet approved or cleared by the [place] FDA and  has been  authorized for detection and/or diagnosis of SARS-CoV-2 by  FDA under an Emergency Use Authorization (EUA). This EUA will remain  in effect (meaning this test can be used) for the duration of the  Covid-19 declaration under Section 564(b)(1) of the Act, 21  U.S.C. section 360bbb-3(b)(1), unless the authorization is  terminated or revoked. Performed at [facility], [address] [place], [place] [address]       Radiology Studies: DG Chest 1 View  Result Date: 06/05/2019 CLINICAL DATA:  Shortness of breath EXAM: CHEST  1 VIEW COMPARISON:  Film from earlier in the same day. FINDINGS: Cardiac shadow is stable. Aortic calcifications are again seen. Poor inspiratory effort is seen. Persistent vascular congestion is noted with small left pleural effusion again consistent with mild CHF. IMPRESSION: The overall appearance of the chest is stable with changes mild CHF. New left pleural effusion is seen. Electronically Signed   By: Inez Catalina M.D.   On: 06/05/2019 20:13   DG Chest Port 1 View  Result Date: 06/05/2019 CLINICAL DATA:  Respiratory distress EXAM: PORTABLE CHEST 1 VIEW COMPARISON:  04/18/2017 FINDINGS: Cardiac shadow is mildly prominent but accentuated by the portable technique. Vascular congestion increased interstitial markings are noted. No sizable effusion is seen. No bony is noted. IMPRESSION: Changes consistent with early CHF. Electronically Signed   By: Inez Catalina M.D.   On: 06/05/2019 01:17     Scheduled Meds: . aspirin EC  81 mg Oral Daily  . brimonidine  1 drop Both Eyes BID  . calcitRIOL  0.25 mcg Oral Daily  . calcitRIOL  0.25 mcg Oral Q M,W,F  . carvedilol  12.5 mg Oral BID  . dorzolamide-timolol  1 drop Both Eyes BID  . doxepin  10 mg Oral QHS  . heparin injection (subcutaneous)  5,000 Units Subcutaneous Q8H  . insulin aspart  0-9 Units Subcutaneous TID WC  . ipratropium-albuterol  3 mL Nebulization QID  . latanoprost  1 drop Left Eye QHS  . olopatadine   1 drop Both Eyes BID  . sertraline  25 mg Oral Daily  . simvastatin  40 mg Oral q1800  . sodium chloride flush  3 mL Intravenous Q12H  . vitamin B-12  1,000 mcg Oral Daily  . [START ON 06/07/2019] Vitamin D (Ergocalciferol)  50,000 Units Oral Q7 days   Continuous Infusions: . sodium chloride    . furosemide (LASIX) infusion 10 mg/hr (06/06/19 0554)     LOS: 1 day  Enzo Bi, MD [facility] If 7PM-7AM, please contact night-coverage 06/06/2019, 5:26 PM

## 2019-06-06 NOTE — Progress Notes (Signed)
RT x3 responded to Code Blue. Upon arrival patient was spontaneous breathing and maintaining pulse. ABG obtained, PH 7.14, CO2 82.  Patient successfully intubated by ED physician, ETT 7.5 secured at 21cm, positive color change, bilateral breath sounds noted. Patient bagged to ICU room where she was placed on Ventilator. Report given to ICU RT2.

## 2019-06-06 NOTE — Consult Note (Signed)
Name: Lori Stanley MRN: [id number] DOB: 16-Sep-1931    ADMISSION DATE:  06/05/2019 CONSULTATION DATE: 06/06/2019  REFERRING MD : Rufina Falco, NP   CHIEF COMPLAINT: Shortness of Breath   BRIEF PATIENT DESCRIPTION: 86 yo female admitted to the telemetry unit with acute on chronic hypoxic respiratory failure secondary to acute diastolic CHF exacerbation.  On 02/4 pt respiratory arrested requiring mechanical intubation and transfer to ICU  SIGNIFICANT EVENTS/STUDIES:  02/3: Pt admitted to the telemetry unit with acute on chronic hypoxic respiratory failure secondary to acute CHF exacerbation  02/4: Per nephrology lasix gtt initiated _0  mg/hr  02/4: Pt respiratory arrested, code blue initiated pt mechanically intubated requiring transfer to ICU. PCCM assumed care   HISTORY OF PRESENT ILLNESS:   This is an 86 yo female with a PMH of Obesity, MI, HTN, HLD, Type II Diabetes Mellitus, Chronic Back Pain, Stage IV CKD (baseline creatinine 3.2/GFR 15 on 04/2019) and Chronic Diastolic CHF.  She presented to [facility] ER on 02/3 from home via EMS with shortness of breath onset of symptoms 3-4 days prior to presentation. Per ER notes upon EMS arrival at pts home she was severely hypoxic with O2 sats in the mid 50's on RA and extremely hypertensive requiring NRB and subq nitro. Pt stated she has been compliant with outpatient diuretics with adequate urinary output.  Upon arrival to the ER pt with 3+ bilateral lower extremity edema, wheezes, crackles, and JVD present.  COVID-19 and Influenza PCR negative, however CXR concerning for CHF.  Lab results revealed glucose 150, BUN 61, creatinine 3.86, BNP 577, troponin 3, and hgb 8.1.  EKG revealed no acute ischemic changes.  She received iv lasix.  Cardiology and Nephrology consulted.  She was subsequently admitted to the telemetry unit per hospitalist team for additional workup and treatment.  On the morning of 02/4 per nephrology pt received 80 mg iv lasix bolus  and lasix gtt _1  mg/hr.  The evening of 02/4 pt respiratory arrested requiring mechanical intubation and transfer to ICU. PCCM team assumed care.  PAST MEDICAL HISTORY :   has a past medical history of Acute heart failure ([place]), Chronic low back pain, Diabetes mellitus without complication ([place]), Hyperlipidemia, Hypertension, MI (myocardial infarction) ([place]), and Obesity.  has a past surgical history that includes Vaginal hysterectomy; Knee surgery; Eye surgery; and Thyroid surgery. Prior to Admission medications   Medication Sig Start Date End Date Taking? Authorizing Provider  acetaminophen (TYLENOL) 500 MG tablet Take 1,000 mg by mouth every 6 (six) hours as needed.   Yes [provider]  albuterol (PROVENTIL HFA;VENTOLIN HFA) 108 (90 Base) MCG/ACT inhaler Inhale 2 puffs into the lungs every 6 (six) hours as needed for wheezing or shortness of breath. 04/13/17  Yes Wieting, Richard, MD  amLODipine (NORVASC) 5 MG tablet Take 1 tablet (5 mg total) by mouth daily. 11/05/18  Yes Jerrol Banana., MD  brimonidine [facility]) 0.2 % ophthalmic solution 1 drop 2 (two) times daily. To affected eye(s) 03/06/19  Yes [provider]  calcitRIOL (ROCALTROL) 0.25 MCG capsule Take 0.25 mcg by mouth every Monday, Wednesday, and Friday.  10/23/18  Yes [provider]  carvedilol (COREG) 12.5 MG tablet TAKE 1 TABLET (12.5 MG TOTAL) BY MOUTH 2 (TWO) TIMES DAILY. 06/02/19  Yes Jerrol Banana., MD  dorzolamide-timolol (COSOPT) 22.3-6.8 MG/ML ophthalmic solution Place 1 drop into both eyes 2 (two) times daily.  03/06/19  Yes [provider]  Doxepin HCl 3 MG TABS Take 1  tablet (3 mg total) by mouth at bedtime. 05/31/19  Yes Mar Daring, PA-C  furosemide (LASIX) 40 MG tablet TAKE 1 TABLET BY MOUTH TWICE A DAY 04/19/19  Yes Jerrol Banana., MD  Insulin Lispro Prot & Lispro (HUMALOG MIX 75/25 KWIKPEN) (75-25) 100 UNIT/ML Kwikpen Inject 4 Units into the skin 2 (two)  times daily. Patient taking differently: Inject 2 Units into the skin 2 (two) times daily.  07/03/18  Yes Jerrol Banana., MD  latanoprost (XALATAN) 0.005 % ophthalmic solution Place 1 drop into the left eye at bedtime.  02/11/15  Yes [provider]  sertraline (ZOLOFT) 25 MG tablet TAKE 1 TABLET BY MOUTH EVERY DAY 04/02/19  Yes Jerrol Banana., MD  telmisartan (MICARDIS) 20 MG tablet TAKE 1 TABLET BY MOUTH EVERY DAY 06/02/19  Yes Jerrol Banana., MD  Vitamin D, Ergocalciferol, (DRISDOL) 50000 units CAPS capsule Take 50,000 Units by mouth every 7 (seven) days.   Yes [provider]  ASPIRIN LOW DOSE 81 MG EC tablet TAKE 1 TABLET (81 MG TOTAL) BY MOUTH DAILY. Patient taking differently: Take 81 mg by mouth daily.  06/02/19   Jerrol Banana., MD  Bepotastine Besilate (BEPREVE) 1.5 % SOLN Place 1 drop into both eyes daily.    [provider]  Blood Glucose Monitoring Suppl (ONE TOUCH ULTRA MINI) w/Device KIT 1 kit by Does not apply route daily. 01/22/19   Jerrol Banana., MD  calcitRIOL (ROCALTROL) 0.25 MCG capsule Take 0.25 mcg by mouth daily.    [provider]  Insulin Pen Needle (B-D UF III MINI PEN NEEDLES) 31G X 5 MM MISC USE TWICE A DAY AS DIRECTED 02/11/19   Jerrol Banana., MD  loratadine (CLARITIN) 10 MG tablet Take 1 tablet (10 mg total) by mouth daily. Patient not taking: Reported on 06/05/2019 12/15/14   Jerrol Banana., MD  OneTouch Delica Lancets 88Q MISC USE AS DIRECTED 05/10/19   Jerrol Banana., MD  Avala ULTRA test strip USE AS DIRECTED 04/20/19   Jerrol Banana., MD  quinapril (ACCUPRIL) 40 MG tablet TAKE 1 TABLET BY MOUTH EVERY DAY Patient not taking: Reported on 05/23/2019 09/22/18   Jerrol Banana., MD   Allergies  Allergen Reactions  . Antihistamines, Chlorpheniramine-Type   . Codeine   . Paxil [Paroxetine]     "makes her feel funny" not in a good way    FAMILY HISTORY:    family history includes Cancer in her sister; Heart attack in her mother; Heart disease in her sister. SOCIAL HISTORY:  reports that she has quit smoking. Her smoking use included cigarettes. She has never used smokeless tobacco. She reports that she does not drink alcohol or use drugs.  REVIEW OF SYSTEMS:   Unable to assess pt intubated   SUBJECTIVE:  Unable to assess pt intubated   VITAL SIGNS: Temp:  [98.1 F (36.7 C)-98.3 F (36.8 C)] 98.1 F (36.7 C) (02/04 1658) Pulse Rate:  [73-87] 73 (02/04 1954) Resp:  [15-20] 17 (02/04 1658) BP: (127-179)/(57-82) 127/57 (02/04 1954) SpO2:  [64 %-100 %] 64 % (02/04 1954) Weight:  [70.4 kg] 70.4 kg (02/04 0418)  PHYSICAL EXAMINATION: General: acutely ill appearing female, NAD mechanically intubated  Neuro: sedated, not following commands, left pupil sluggish and reactive, right cataract  HEENT: supple, no JVD  Cardiovascular: nsr, rrr, no R/G Lungs: diffuse crackles throughout, even, non labored  Abdomen: +BS x4, soft,  non distended  Musculoskeletal: 2+ bilateral lower extremity edema  Skin: intact no rashes or lesions present   Recent Labs  Lab 06/05/19 0102 06/06/19 0621  NA 136 137  K 4.7 4.6  CL 102 104  CO2 25 27  BUN 61* 66*  CREATININE 3.86* 3.95*  GLUCOSE 150* 96   Recent Labs  Lab 06/05/19 0102 06/05/19 0647 06/06/19 0913  HGB 8.1* 7.1* 7.5*  HCT 26.3* 23.6* 25.4*  WBC 7.1 5.3 5.5  PLT 266 210 246   DG Chest 1 View  Result Date: 06/05/2019 CLINICAL DATA:  Shortness of breath EXAM: CHEST  1 VIEW COMPARISON:  Film from earlier in the same day. FINDINGS: Cardiac shadow is stable. Aortic calcifications are again seen. Poor inspiratory effort is seen. Persistent vascular congestion is noted with small left pleural effusion again consistent with mild CHF. IMPRESSION: The overall appearance of the chest is stable with changes mild CHF. New left pleural effusion is seen. Electronically Signed   By: Inez Catalina M.D.    On: 06/05/2019 20:13   DG Chest Port 1 View  Result Date: 06/05/2019 CLINICAL DATA:  Respiratory distress EXAM: PORTABLE CHEST 1 VIEW COMPARISON:  04/18/2017 FINDINGS: Cardiac shadow is mildly prominent but accentuated by the portable technique. Vascular congestion increased interstitial markings are noted. No sizable effusion is seen. No bony is noted. IMPRESSION: Changes consistent with early CHF. Electronically Signed   By: Inez Catalina M.D.   On: 06/05/2019 01:17   ECHOCARDIOGRAM COMPLETE  Result Date: 06/06/2019   ECHOCARDIOGRAM REPORT   Patient Name:   NETHRA MEHLBERG Date of Exam: 06/05/2019 Medical Rec #:  [id number]         Height:       63.0 in Accession #:    [id number]        Weight:       137.0 lb Date of Birth:  21-Dec-1931        BSA:          1.65 m Patient Age:    71 years          BP:           140/64 mmHg Patient Gender: F                 HR:           66 bpm. Exam Location:  [facility] Procedure: 2D Echo, Color Doppler and Cardiac Doppler Indications:     CHF-acute diastolic 829.56  History:         Patient has prior history of Echocardiogram examinations, most                  recent 04/11/2017. Risk Factors:Diabetes and Hypertension.                  Acute heart failure, MI.  Sonographer:     Sherrie Sport RDCS (AE) Referring Phys:  [id number] Arvil Chaco Diagnosing Phys: Ida Rogue MD IMPRESSIONS  1. Left ventricular ejection fraction, by visual estimation, is 60 to 65%. The left ventricle has normal function. There is moderately increased left ventricular hypertrophy.  2. The left ventricle has no regional wall motion abnormalities.  3. Left ventricular diastolic parameters are consistent with Grade II diastolic dysfunction (pseudonormalization).  4. Global right ventricle has normal systolic function.The right ventricular size is normal. No increase in right ventricular wall thickness.  5. Left atrial size was severely dilated.  6. Mildly elevated pulmonary artery systolic pressure.  7.  The inferior vena cava is dilated in size with <50% respiratory variability, suggesting right atrial pressure of 15 mmHg.  8. pleural effusion noted on left, 7 cm  9. Small pericardial effusion. FINDINGS  Left Ventricle: Left ventricular ejection fraction, by visual estimation, is 60 to 65%. The left ventricle has normal function. The left ventricle has no regional wall motion abnormalities. There is moderately increased left ventricular hypertrophy. Left ventricular diastolic parameters are consistent with Grade II diastolic dysfunction (pseudonormalization). Normal left atrial pressure. Right Ventricle: The right ventricular size is normal. No increase in right ventricular wall thickness. Global RV systolic function is has normal systolic function. The tricuspid regurgitant velocity is 2.25 m/s, and with an assumed right atrial pressure  of 15 mmHg, the estimated right ventricular systolic pressure is mildly elevated at 35.2 mmHg. Left Atrium: Left atrial size was severely dilated. Right Atrium: Right atrial size was normal in size Pericardium: A small pericardial effusion is present. Mitral Valve: The mitral valve is normal in structure. Mild mitral valve regurgitation. No evidence of mitral valve stenosis by observation. MV peak gradient, 13.7 mmHg. Tricuspid Valve: The tricuspid valve is normal in structure. Tricuspid valve regurgitation is mild. Aortic Valve: The aortic valve was not well visualized. Aortic valve regurgitation is not visualized. Mild to moderate aortic valve sclerosis/calcification is present, without any evidence of aortic stenosis. Aortic valve mean gradient measures 10.0 mmHg. Aortic valve peak gradient measures 18.1 mmHg. Aortic valve area, by VTI measures 1.93 cm. Pulmonic Valve: The pulmonic valve was normal in structure. Pulmonic valve regurgitation is not visualized. Pulmonic regurgitation is not visualized. Aorta: The aortic root, ascending aorta and aortic arch are all structurally  normal, with no evidence of dilitation or obstruction. Venous: The inferior vena cava is dilated in size with less than 50% respiratory variability, suggesting right atrial pressure of 15 mmHg. IAS/Shunts: No atrial level shunt detected by color flow Doppler. There is no evidence of a patent foramen ovale. No ventricular septal defect is seen or detected. There is no evidence of an atrial septal defect.  LEFT VENTRICLE PLAX 2D LVIDd:         4.33 cm  Diastology LVIDs:         2.77 cm  LV e' medial:   3.81 cm/s LV PW:         1.19 cm  LV E/e' medial: 38.6 LV IVS:        1.20 cm LVOT diam:     2.00 cm LV SV:         56 ml LV SV Index:   33.30 LVOT Area:     3.14 cm  RIGHT VENTRICLE RV Basal diam:  4.09 cm RV S prime:     12.00 cm/s TAPSE (M-mode): 3.3 cm LEFT ATRIUM              Index       RIGHT ATRIUM           Index LA diam:        4.60 cm  2.79 cm/m  RA Area:     13.70 cm LA Vol (A2C):   139.0 ml 84.42 ml/m RA Volume:   37.30 ml  22.65 ml/m LA Vol (A4C):   66.6 ml  40.45 ml/m LA Biplane Vol: 104.0 ml 63.17 ml/m  AORTIC VALVE                    PULMONIC VALVE AV Area (Vmax):  1.70 cm     PV Vmax:        0.83 m/s AV Area (Vmean):   1.75 cm     PV Peak grad:   2.8 mmHg AV Area (VTI):     1.93 cm     RVOT Peak grad: 4 mmHg AV Vmax:           212.67 cm/s AV Vmean:          146.667 cm/s AV VTI:            0.530 m AV Peak Grad:      18.1 mmHg AV Mean Grad:      10.0 mmHg LVOT Vmax:         115.00 cm/s LVOT Vmean:        81.700 cm/s LVOT VTI:          0.326 m LVOT/AV VTI ratio: 0.62  AORTA Ao Root diam: 2.60 cm MITRAL VALVE                         TRICUSPID VALVE MV Area (PHT): 2.21 cm              TR Peak grad:   20.2 mmHg MV Peak grad:  13.7 mmHg             TR Vmax:        225.00 cm/s MV Mean grad:  5.0 mmHg MV Vmax:       1.85 m/s              SHUNTS MV Vmean:      92.6 cm/s             Systemic VTI:  0.33 m MV VTI:        0.54 m                Systemic Diam: 2.00 cm MV PHT:        99.47 msec MV Decel  Time: 343 msec MV E velocity: 147.00 cm/s 103 cm/s MV A velocity: 105.00 cm/s 70.3 cm/s MV E/A ratio:  1.40        1.5  Ida Rogue MD Electronically signed by Ida Rogue MD Signature Date/Time: 06/06/2019/7:41:04 PM    Final     ASSESSMENT / PLAN:  Acute on chronic hypoxic hypercapnic respiratory failure secondary to acute on chronic diastolic CHF exacerbation  Mechanical Intubation  Full vent support for now-vent settings reviewed and established  SBT once all parameters met  VAP bundle implemented Prn bronchodilator therapy   Acute on chronic diastolic CHF  Mild aortic valve stenosis  Hx: HLD, MI, and HTN  Continuous telemetry monitoring  Continue lasix gtt  Continue outpatient coreg as bp tolerates and simvastatin  Cardiology consulted appreciate input  Acute on chronic renal failure (baseline creatinine 3.2/GFR 15 on 04/2019) Trend BMP  Replace electrolytes as indicated  Monitor UOP Avoid nephrotoxic medications Nephrology consulted appreciate input   Type II Diabetes Mellitus  CBG's q4hrs  SSI   Mechanical ventilation pain/discomfort  Maintain RASS goal -1 to -2 Fentanyl gtt and prn versed to maintain RASS goal and for pain management  WUA daily   Best Practice:  VTE px: subq heparin  SUP px: iv famotidine Diet: if pt remains intubated in the next 24hrs will start TF's   Marda Stalker, [place] Pager [phone] (please enter 7 digits) PCCM Consult Pager [phone] (please enter 7 digits)

## 2019-06-06 NOTE — Progress Notes (Signed)
Ch responded to Code Blue to Pt while in [id number]. Upon arrival Pt appeared to have gotten a pulse. Ch left to respond to another page. Upon return, Pt had been moved to ICU.   06/06/19 1951  Clinical Encounter Type  Visited With Other (Comment)  Visit Type Code  Referral From Other (Comment)  Consult/Referral To Other (Comment)  Spiritual Encounters  Spiritual Needs Other (Comment)

## 2019-06-07 ENCOUNTER — Other Ambulatory Visit (INDEPENDENT_AMBULATORY_CARE_PROVIDER_SITE_OTHER): Payer: Self-pay | Admitting: Vascular Surgery

## 2019-06-07 ENCOUNTER — Encounter
Admission: EM | Disposition: A | Discharge status: Home / Self Care | Payer: Self-pay | Source: Home / Self Care | Attending: Internal Medicine

## 2019-06-07 DIAGNOSIS — J9601 Acute respiratory failure with hypoxia: Secondary | ICD-10-CM

## 2019-06-07 DIAGNOSIS — N185 Chronic kidney disease, stage 5: Secondary | ICD-10-CM

## 2019-06-07 DIAGNOSIS — R092 Respiratory arrest: Secondary | ICD-10-CM

## 2019-06-07 HISTORY — PX: DIALYSIS/PERMA CATHETER INSERTION: CATH118288

## 2019-06-07 LAB — BLOOD GAS, ARTERIAL
Acid-Base Excess: 0.5 mmol/L (ref 0.0–2.0)
Bicarbonate: 24.6 mmol/L (ref 20.0–28.0)
FIO2: 0.4
MECHVT: 500 mL
O2 Saturation: 97.7 %
PEEP: 5 cmH2O
Patient temperature: 37
RATE: 20 resp/min
pCO2 arterial: 37 mmHg (ref 32.0–48.0)
pH, Arterial: 7.43 (ref 7.350–7.450)
pO2, Arterial: 97 mmHg (ref 83.0–108.0)

## 2019-06-07 LAB — MAGNESIUM: Magnesium: 2.6 mg/dL — ABNORMAL HIGH (ref 1.7–2.4)

## 2019-06-07 LAB — CBC
HCT: 25.5 % — ABNORMAL LOW (ref 36.0–46.0)
Hemoglobin: 8 g/dL — ABNORMAL LOW (ref 12.0–15.0)
MCH: 27.1 pg (ref 26.0–34.0)
MCHC: 31.4 g/dL (ref 30.0–36.0)
MCV: 86.4 fL (ref 80.0–100.0)
Platelets: 243 10*3/uL (ref 150–400)
RBC: 2.95 MIL/uL — ABNORMAL LOW (ref 3.87–5.11)
RDW: 14.4 % (ref 11.5–15.5)
WBC: 6.5 10*3/uL (ref 4.0–10.5)
nRBC: 0 % (ref 0.0–0.2)

## 2019-06-07 LAB — GLUCOSE, CAPILLARY
Glucose-Capillary: 115 mg/dL — ABNORMAL HIGH (ref 70–99)
Glucose-Capillary: 116 mg/dL — ABNORMAL HIGH (ref 70–99)
Glucose-Capillary: 125 mg/dL — ABNORMAL HIGH (ref 70–99)
Glucose-Capillary: 131 mg/dL — ABNORMAL HIGH (ref 70–99)
Glucose-Capillary: 184 mg/dL — ABNORMAL HIGH (ref 70–99)
Glucose-Capillary: 45 mg/dL — ABNORMAL LOW (ref 70–99)
Glucose-Capillary: 84 mg/dL (ref 70–99)
Glucose-Capillary: 86 mg/dL (ref 70–99)

## 2019-06-07 LAB — BASIC METABOLIC PANEL
Anion gap: 9 (ref 5–15)
BUN: 66 mg/dL — ABNORMAL HIGH (ref 8–23)
CO2: 26 mmol/L (ref 22–32)
Calcium: 9.4 mg/dL (ref 8.9–10.3)
Chloride: 103 mmol/L (ref 98–111)
Creatinine, Ser: 3.95 mg/dL — ABNORMAL HIGH (ref 0.44–1.00)
GFR calc Af Amer: 11 mL/min — ABNORMAL LOW (ref >60)
GFR calc non Af Amer: 10 mL/min — ABNORMAL LOW (ref >60)
Glucose, Bld: 109 mg/dL — ABNORMAL HIGH (ref 70–99)
Potassium: 4.2 mmol/L (ref 3.5–5.1)
Sodium: 138 mmol/L (ref 135–145)

## 2019-06-07 LAB — HEPATITIS B SURFACE ANTIGEN: Hepatitis B Surface Ag: NONREACTIVE

## 2019-06-07 LAB — HEPATITIS C ANTIBODY: HCV Ab: NONREACTIVE

## 2019-06-07 LAB — RENAL FUNCTION PANEL
Albumin: 3.3 g/dL — ABNORMAL LOW (ref 3.5–5.0)
Anion gap: 11 (ref 5–15)
BUN: 63 mg/dL — ABNORMAL HIGH (ref 8–23)
CO2: 25 mmol/L (ref 22–32)
Calcium: 9.5 mg/dL (ref 8.9–10.3)
Chloride: 103 mmol/L (ref 98–111)
Creatinine, Ser: 3.81 mg/dL — ABNORMAL HIGH (ref 0.44–1.00)
GFR calc Af Amer: 12 mL/min — ABNORMAL LOW (ref >60)
GFR calc non Af Amer: 10 mL/min — ABNORMAL LOW (ref >60)
Glucose, Bld: 111 mg/dL — ABNORMAL HIGH (ref 70–99)
Phosphorus: 4.3 mg/dL (ref 2.5–4.6)
Potassium: 4.2 mmol/L (ref 3.5–5.1)
Sodium: 139 mmol/L (ref 135–145)

## 2019-06-07 LAB — MRSA PCR SCREENING: MRSA by PCR: NEGATIVE

## 2019-06-07 LAB — PROTIME-INR
INR: 1.2 (ref 0.8–1.2)
Prothrombin Time: 14.9 seconds (ref 11.4–15.2)

## 2019-06-07 LAB — HEPATITIS B SURFACE ANTIBODY,QUALITATIVE: Hep B S Ab: NONREACTIVE

## 2019-06-07 LAB — HEPATITIS B CORE ANTIBODY, IGM: Hep B C IgM: NONREACTIVE

## 2019-06-07 SURGERY — DIALYSIS/PERMA CATHETER INSERTION
Anesthesia: Moderate Sedation

## 2019-06-07 MED ORDER — DEXTROSE 50 % IV SOLN: 1.0000 | Freq: Once | INTRAVENOUS | Status: DC

## 2019-06-07 MED ORDER — HEPARIN SODIUM (PORCINE) 10000 UNIT/ML IJ SOLN
INTRAMUSCULAR | Status: AC
  Filled 2019-06-07: qty 1

## 2019-06-07 MED ORDER — ATORVASTATIN CALCIUM 20 MG PO TABS
20.0000 mg | ORAL_TABLET | Freq: Every day | ORAL | Status: DC
  Administered 2019-06-07 – 2019-06-09 (×2): 20 mg
  Filled 2019-06-07 (×2): qty 1

## 2019-06-07 MED ORDER — DEXTROSE 50 % IV SOLN
INTRAVENOUS | Status: AC
  Filled 2019-06-07: qty 50

## 2019-06-07 MED ORDER — MIDAZOLAM HCL 5 MG/5ML IJ SOLN
INTRAMUSCULAR | Status: AC
  Administered 2019-06-07: 17:00:00 1 mg via INTRAVENOUS
  Filled 2019-06-07: qty 5

## 2019-06-07 MED ORDER — MIDAZOLAM HCL 2 MG/2ML IJ SOLN: 1.0000 mg | Freq: Once | INTRAMUSCULAR | Status: AC

## 2019-06-07 MED ORDER — FENTANYL CITRATE (PF) 100 MCG/2ML IJ SOLN: 50.0000 ug | Freq: Once | INTRAMUSCULAR | Status: AC

## 2019-06-07 MED ORDER — DEXMEDETOMIDINE HCL IN NACL 400 MCG/100ML IV SOLN
0.4000 ug/kg/h | INTRAVENOUS | Status: DC
  Administered 2019-06-07: 0.4 ug/kg/h via INTRAVENOUS
  Administered 2019-06-07: 0.6 ug/kg/h via INTRAVENOUS
  Administered 2019-06-08: 0.8 ug/kg/h via INTRAVENOUS
  Administered 2019-06-08 – 2019-06-09 (×3): 1.2 ug/kg/h via INTRAVENOUS
  Administered 2019-06-10: 0.4 ug/kg/h via INTRAVENOUS
  Filled 2019-06-07 (×8): qty 100

## 2019-06-07 MED ORDER — FENTANYL CITRATE (PF) 100 MCG/2ML IJ SOLN
INTRAMUSCULAR | Status: AC
  Administered 2019-06-07: 50 ug via INTRAVENOUS
  Filled 2019-06-07: qty 2

## 2019-06-07 SURGICAL SUPPLY — 9 items
CATH PALINDROME RT-P 15FX19CM (CATHETERS) ×3 IMPLANT
DERMABOND ADVANCED (GAUZE/BANDAGES/DRESSINGS) ×2
DERMABOND ADVANCED .7 DNX12 (GAUZE/BANDAGES/DRESSINGS) ×1 IMPLANT
DRAPE INCISE IOBAN 66X45 STRL (DRAPES) ×3 IMPLANT
NEEDLE ENTRY 21GA 7CM ECHOTIP (NEEDLE) ×3 IMPLANT
PACK ANGIOGRAPHY (CUSTOM PROCEDURE TRAY) ×3 IMPLANT
SET INTRO CAPELLA COAXIAL (SET/KITS/TRAYS/PACK) ×3 IMPLANT
SUT MNCRL AB 4-0 PS2 18 (SUTURE) ×3 IMPLANT
SUT SILK 0 FSL (SUTURE) ×3 IMPLANT

## 2019-06-07 NOTE — Progress Notes (Signed)
[facility]  ROUNDING NOTE   Subjective:   Transferred to ICU for acute respiratory distress. Intubated.   UOP 1412m.   Furosemide 170mhr gtt  Objective:  Vital signs in last 24 hours:  Temp:  [97.9 F (36.6 C)-99.1 F (37.3 C)] 99.1 F (37.3 C) (02/05 0800) Pulse Rate:  [53-87] 58 (02/05 0800) Resp:  [17-25] 20 (02/05 0800) BP: (105-199)/(45-85) 124/61 (02/05 0800) SpO2:  [64 %-100 %] 100 % (02/05 0800) FiO2 (%):  [40 %-50 %] 40 % (02/05 0800)  Weight change:  Filed Weights   06/06/19 0418  Weight: 70.4 kg    Intake/Output: I/O last 3 completed shifts: In: 998.4 [P.O.:720; I.V.:228.4; IV Piggyback:50] Out: 157681Urine:1575]   Intake/Output this shift:  Total I/O In: 113.9 [I.V.:113.9] Out: 50 [Urine:50]  Physical Exam: General: Critically ill  Head: ETT, OGT  Eyes: Scleral edema, PERRL  Neck: trachea midline  Lungs:  Clear, PRVC 40%  Heart: Regular rate and rhythm  Abdomen:  Soft   Extremities:  ++ peripheral edema.  Neurologic: Intubated and sedated  Skin: No lesions  Access: none    Basic Metabolic Panel: Recent Labs  Lab 06/05/19 0102 06/05/19 0102 06/06/19 [id number]/04/21 [id number]/04/21 0913 06/06/19 2023 06/07/19 0612 06/07/19 0629  NA 136  --  137  --   --  137 139 138  K 4.7  --  4.6  --   --  4.7 4.2 4.2  CL 102  --  104  --   --  103 103 103  CO2 25  --  27  --   --  25 25 26   GLUCOSE 150*  --  96  --   --  147* 111* 109*  BUN 61*  --  66*  --   --  65* 63* 66*  CREATININE 3.86*  --  3.95*  --   --  3.74* 3.81* 3.95*  CALCIUM 9.2   < > 9.2   < >  --  9.6 9.5 9.4  MG  --   --   --   --  2.5*  --   --  2.6*  PHOS  --   --   --   --   --   --  4.3  --    < > = values in this interval not displayed.    Liver Function Tests: Recent Labs  Lab 06/05/19 0102 06/06/19 2023 06/07/19 0612  AST 26 26  --   ALT 19 23  --   ALKPHOS 69 68  --   BILITOT 0.6 0.6  --   PROT 6.7 6.3*  --   ALBUMIN 3.6 3.5 3.3*   No results  for input(s): LIPASE, AMYLASE in the last 168 hours. No results for input(s): AMMONIA in the last 168 hours.  CBC: Recent Labs  Lab 06/05/19 0102 06/05/19 0647 06/06/19 0913 06/06/19 2023 06/07/19 0629  WBC 7.1 5.3 5.5 6.0 6.5  NEUTROABS  --  4.3  --  5.2  --   HGB 8.1* 7.1* 7.5* 8.6* 8.0*  HCT 26.3* 23.6* 25.4* 29.0* 25.5*  MCV 87.1 88.7 89.4 88.4 86.4  PLT 266 210 246 267 243    Cardiac Enzymes: No results for input(s): CKTOTAL, CKMB, CKMBINDEX, TROPONINI in the last 168 hours.  BNP: Invalid input(s): POCBNP  CBG: Recent Labs  Lab 06/06/19 1657 06/06/19 2015 06/06/19 2232 06/07/19 0435 06/07/19 0720  GLUCAP 122* 137* 154* 131* 86    Microbiology: Results for orders  placed or performed during the hospital encounter of 06/05/19  Respiratory Panel by RT PCR (Flu A&B, Covid) - Nasopharyngeal Swab     Status: None   Collection Time: 06/05/19  2:27 AM   Specimen: Nasopharyngeal Swab  Result Value Ref Range Status   SARS Coronavirus 2 by RT PCR NEGATIVE NEGATIVE Final    Comment: (NOTE) SARS-CoV-2 target nucleic acids are NOT DETECTED. The SARS-CoV-2 RNA is generally detectable in upper respiratoy specimens during the acute phase of infection. The lowest concentration of SARS-CoV-2 viral copies this assay can detect is 131 copies/mL. A negative result does not preclude SARS-Cov-2 infection and should not be used as the sole basis for treatment or other patient management decisions. A negative result may occur with  improper specimen collection/handling, submission of specimen other than nasopharyngeal swab, presence of viral mutation(s) within the areas targeted by this assay, and inadequate number of viral copies (<131 copies/mL). A negative result must be combined with clinical observations, patient history, and epidemiological information. The expected result is Negative. Fact Sheet for Patients:  PinkCheek.be Fact Sheet for  Healthcare Providers:  GravelBags.it This test is not yet ap proved or cleared by the [place] FDA and  has been authorized for detection and/or diagnosis of SARS-CoV-2 by FDA under an Emergency Use Authorization (EUA). This EUA will remain  in effect (meaning this test can be used) for the duration of the COVID-19 declaration under Section 564(b)(1) of the Act, 21 U.S.C. section 360bbb-3(b)(1), unless the authorization is terminated or revoked sooner.    Influenza A by PCR NEGATIVE NEGATIVE Final   Influenza B by PCR NEGATIVE NEGATIVE Final    Comment: (NOTE) The Xpert Xpress SARS-CoV-2/FLU/RSV assay is intended as an aid in  the diagnosis of influenza from Nasopharyngeal swab specimens and  should not be used as a sole basis for treatment. Nasal washings and  aspirates are unacceptable for Xpert Xpress SARS-CoV-2/FLU/RSV  testing. Fact Sheet for Patients: PinkCheek.be Fact Sheet for Healthcare Providers: GravelBags.it This test is not yet approved or cleared by the [place] FDA and  has been authorized for detection and/or diagnosis of SARS-CoV-2 by  FDA under an Emergency Use Authorization (EUA). This EUA will remain  in effect (meaning this test can be used) for the duration of the  Covid-19 declaration under Section 564(b)(1) of the Act, 21  U.S.C. section 360bbb-3(b)(1), unless the authorization is  terminated or revoked. Performed at [facility], [address] [place], [place] [address]   MRSA PCR Screening     Status: None   Collection Time: 06/07/19 12:53 AM   Specimen: Nasopharyngeal  Result Value Ref Range Status   MRSA by PCR NEGATIVE NEGATIVE Final    Comment:        The GeneXpert MRSA Assay (FDA approved for NASAL specimens only), is one component of a comprehensive MRSA colonization surveillance program. It is not intended to diagnose MRSA infection  nor to guide or monitor treatment for MRSA infections. Performed at [facility], [address] [place], [place] [address]     Coagulation Studies: No results for input(s): LABPROT, INR in the last 72 hours.  Urinalysis: No results for input(s): COLORURINE, LABSPEC, PHURINE, GLUCOSEU, HGBUR, BILIRUBINUR, KETONESUR, PROTEINUR, UROBILINOGEN, NITRITE, LEUKOCYTESUR in the last 72 hours.  Invalid input(s): APPERANCEUR    Imaging: DG Chest 1 View  Result Date: 06/06/2019 CLINICAL DATA:  86 year old female status post intubation. EXAM: CHEST  1 VIEW COMPARISON:  Chest radiograph dated 06/05/2019. FINDINGS:  Endotracheal tube with tip approximately 2.5 cm above the carina. There is vascular congestion and edema as well as left pleural effusion, similar or slightly worsened since the prior radiograph. Pneumonia is not excluded. Clinical correlation is recommended. No pneumothorax. Stable cardiac silhouette. Atherosclerotic calcification of the aorta. No acute osseous pathology. IMPRESSION: 1. Interval placement of an endotracheal tube with tip above the carina. 2. CHF and left effusion similar or slightly worsened. Pneumonia is not excluded. Clinical correlation is recommended. Electronically Signed   By: Anner Crete M.D.   On: 06/06/2019 20:25   DG Chest 1 View  Result Date: 06/05/2019 CLINICAL DATA:  Shortness of breath EXAM: CHEST  1 VIEW COMPARISON:  Film from earlier in the same day. FINDINGS: Cardiac shadow is stable. Aortic calcifications are again seen. Poor inspiratory effort is seen. Persistent vascular congestion is noted with small left pleural effusion again consistent with mild CHF. IMPRESSION: The overall appearance of the chest is stable with changes mild CHF. New left pleural effusion is seen. Electronically Signed   By: Inez Catalina M.D.   On: 06/05/2019 20:13   DG Abd 1 View  Result Date: 06/06/2019 CLINICAL DATA:  OG tube placement EXAM: ABDOMEN - 1 VIEW COMPARISON:   None. FINDINGS: The OG tube projects over the gastric body. The bowel gas pattern is nonobstructive. Body wall edema is noted. IMPRESSION: OG tube projects over the stomach. Electronically Signed   By: Constance Holster M.D.   On: 06/06/2019 21:51   ECHOCARDIOGRAM COMPLETE  Result Date: 06/06/2019   ECHOCARDIOGRAM REPORT   Patient Name:   Lori Stanley Date of Exam: 06/05/2019 Medical Rec #:  [id number]         Height:       63.0 in Accession #:    [id number]        Weight:       137.0 lb Date of Birth:  1931-12-08        BSA:          1.65 m Patient Age:    24 years          BP:           140/64 mmHg Patient Gender: F                 HR:           66 bpm. Exam Location:  [facility] Procedure: 2D Echo, Color Doppler and Cardiac Doppler Indications:     CHF-acute diastolic 829.56  History:         Patient has prior history of Echocardiogram examinations, most                  recent 04/11/2017. Risk Factors:Diabetes and Hypertension.                  Acute heart failure, MI.  Sonographer:     Sherrie Sport RDCS (AE) Referring Phys:  [id number] Arvil Chaco Diagnosing Phys: Ida Rogue MD IMPRESSIONS  1. Left ventricular ejection fraction, by visual estimation, is 60 to 65%. The left ventricle has normal function. There is moderately increased left ventricular hypertrophy.  2. The left ventricle has no regional wall motion abnormalities.  3. Left ventricular diastolic parameters are consistent with Grade II diastolic dysfunction (pseudonormalization).  4. Global right ventricle has normal systolic function.The right ventricular size is normal. No increase in right ventricular wall thickness.  5. Left atrial size was severely dilated.  6. Mildly elevated pulmonary artery  systolic pressure.  7. The inferior vena cava is dilated in size with <50% respiratory variability, suggesting right atrial pressure of 15 mmHg.  8. pleural effusion noted on left, 7 cm  9. Small pericardial effusion. FINDINGS  Left Ventricle: Left  ventricular ejection fraction, by visual estimation, is 60 to 65%. The left ventricle has normal function. The left ventricle has no regional wall motion abnormalities. There is moderately increased left ventricular hypertrophy. Left ventricular diastolic parameters are consistent with Grade II diastolic dysfunction (pseudonormalization). Normal left atrial pressure. Right Ventricle: The right ventricular size is normal. No increase in right ventricular wall thickness. Global RV systolic function is has normal systolic function. The tricuspid regurgitant velocity is 2.25 m/s, and with an assumed right atrial pressure  of 15 mmHg, the estimated right ventricular systolic pressure is mildly elevated at 35.2 mmHg. Left Atrium: Left atrial size was severely dilated. Right Atrium: Right atrial size was normal in size Pericardium: A small pericardial effusion is present. Mitral Valve: The mitral valve is normal in structure. Mild mitral valve regurgitation. No evidence of mitral valve stenosis by observation. MV peak gradient, 13.7 mmHg. Tricuspid Valve: The tricuspid valve is normal in structure. Tricuspid valve regurgitation is mild. Aortic Valve: The aortic valve was not well visualized. Aortic valve regurgitation is not visualized. Mild to moderate aortic valve sclerosis/calcification is present, without any evidence of aortic stenosis. Aortic valve mean gradient measures 10.0 mmHg. Aortic valve peak gradient measures 18.1 mmHg. Aortic valve area, by VTI measures 1.93 cm. Pulmonic Valve: The pulmonic valve was normal in structure. Pulmonic valve regurgitation is not visualized. Pulmonic regurgitation is not visualized. Aorta: The aortic root, ascending aorta and aortic arch are all structurally normal, with no evidence of dilitation or obstruction. Venous: The inferior vena cava is dilated in size with less than 50% respiratory variability, suggesting right atrial pressure of 15 mmHg. IAS/Shunts: No atrial level  shunt detected by color flow Doppler. There is no evidence of a patent foramen ovale. No ventricular septal defect is seen or detected. There is no evidence of an atrial septal defect.  LEFT VENTRICLE PLAX 2D LVIDd:         4.33 cm  Diastology LVIDs:         2.77 cm  LV e' medial:   3.81 cm/s LV PW:         1.19 cm  LV E/e' medial: 38.6 LV IVS:        1.20 cm LVOT diam:     2.00 cm LV SV:         56 ml LV SV Index:   33.30 LVOT Area:     3.14 cm  RIGHT VENTRICLE RV Basal diam:  4.09 cm RV S prime:     12.00 cm/s TAPSE (M-mode): 3.3 cm LEFT ATRIUM              Index       RIGHT ATRIUM           Index LA diam:        4.60 cm  2.79 cm/m  RA Area:     13.70 cm LA Vol (A2C):   139.0 ml 84.42 ml/m RA Volume:   37.30 ml  22.65 ml/m LA Vol (A4C):   66.6 ml  40.45 ml/m LA Biplane Vol: 104.0 ml 63.17 ml/m  AORTIC VALVE                    PULMONIC VALVE AV Area (Vmax):  1.70 cm     PV Vmax:        0.83 m/s AV Area (Vmean):   1.75 cm     PV Peak grad:   2.8 mmHg AV Area (VTI):     1.93 cm     RVOT Peak grad: 4 mmHg AV Vmax:           212.67 cm/s AV Vmean:          146.667 cm/s AV VTI:            0.530 m AV Peak Grad:      18.1 mmHg AV Mean Grad:      10.0 mmHg LVOT Vmax:         115.00 cm/s LVOT Vmean:        81.700 cm/s LVOT VTI:          0.326 m LVOT/AV VTI ratio: 0.62  AORTA Ao Root diam: 2.60 cm MITRAL VALVE                         TRICUSPID VALVE MV Area (PHT): 2.21 cm              TR Peak grad:   20.2 mmHg MV Peak grad:  13.7 mmHg             TR Vmax:        225.00 cm/s MV Mean grad:  5.0 mmHg MV Vmax:       1.85 m/s              SHUNTS MV Vmean:      92.6 cm/s             Systemic VTI:  0.33 m MV VTI:        0.54 m                Systemic Diam: 2.00 cm MV PHT:        99.47 msec MV Decel Time: 343 msec MV E velocity: 147.00 cm/s 103 cm/s MV A velocity: 105.00 cm/s 70.3 cm/s MV E/A ratio:  1.40        1.5  Ida Rogue MD Electronically signed by Ida Rogue MD Signature Date/Time: 06/06/2019/7:41:04 PM     Final      Medications:   . sodium chloride    . dexmedetomidine (PRECEDEX) IV infusion 0.4 mcg/kg/hr (06/07/19 0817)  . famotidine (PEPCID) IV Stopped (06/06/19 2227)  . fentaNYL infusion INTRAVENOUS 200 mcg/hr (06/07/19 0817)  . furosemide (LASIX) infusion 10 mg/hr (06/07/19 0817)   . amLODipine  5 mg Per Tube Daily  . aspirin  81 mg Per Tube Daily  . atorvastatin  20 mg Per Tube q1800  . brimonidine  1 drop Both Eyes BID  . carvedilol  12.5 mg Per Tube BID  . chlorhexidine gluconate (MEDLINE KIT)  15 mL Mouth Rinse BID  . Chlorhexidine Gluconate Cloth  6 each Topical Daily  . dorzolamide-timolol  1 drop Both Eyes BID  . fentaNYL (SUBLIMAZE) injection  25 mcg Intravenous Once  . heparin injection (subcutaneous)  5,000 Units Subcutaneous Q8H  . insulin aspart  0-9 Units Subcutaneous Q4H  . ipratropium-albuterol  3 mL Nebulization QID  . latanoprost  1 drop Left Eye QHS  . mouth rinse  15 mL Mouth Rinse 10 times per day  . olopatadine  1 drop Both Eyes BID  . sertraline  25 mg Per Tube Daily  . sodium chloride flush  3 mL Intravenous Q12H  . vitamin B-12  1,000 mcg Per Tube Daily   sodium chloride, acetaminophen, bisacodyl, fentaNYL, hydrALAZINE, midazolam, midazolam, ondansetron (ZOFRAN) IV, sodium chloride flush  Assessment/ Plan:  Lori Stanley is an 86 y.o. black female with diabetes mellitus type II, hypertension, anemia, GERD, depression, anxiety, glaucoma, hyperlipidemia, diastolic congestive heart failure, coronary artery disease admitted to [facility] on 06/05/2019 for acute exacerbation of chronic diastolic congestive heart failure.   1. Acute renal failure on chronic kidney disease stage V with proteinuria and hematuria: baseline creatinine of 3.2, GFR of 15 on 04/10/19.  Chronic kidney disease secondary to diabetic nephropathy Consent taken for dialysis Nonoliguric urine output with furosemide drip However due to critical condition, plan on initiating dialysis.  -  holding telmisartan  2. Hypertension: with acute exacerbation of diastolic congestive heart failure: Echo 2/3 Home regimen of telmisartan, furosemide, carvedilol and amlodipine.  Acute respiratory failure secondary to  - furosemide gtt - hold amlodipine and telmisartan.  3. Diabetes mellitus type II with chronic kidney disease: insulin dependent. Hemoglobin A1c of 6% on 04/10/19.  Well controlled  4. Anemia with chronic kidney disease: 8, normocytic. Patient is agreeable to ESAs.   5. Secondary Hyperparathyroidism - calcitriol.    LOS: 2 Dorsey Authement 2/5/20218:55 AM

## 2019-06-07 NOTE — Progress Notes (Signed)
Initial Nutrition Assessment  RD working remotely.  DOCUMENTATION CODES:   Not applicable  INTERVENTION:  If patient remains intubated >24-48 hours recommend initiating Vital AF 1.2 Cal at 50 mL/hr (1200 mL goal daily volume). Provides 1440 kcal, 90 grams of protein, 972 mL H2O daily.  If tube feeds are initiated provide liquid MVI daily per tube and a minimum free water flush of 20-30 mL Q4hrs to maintain tube patency.  NUTRITION DIAGNOSIS:   Inadequate oral intake related to inability to eat as evidenced by NPO status.  GOAL:   Provide needs based on ASPEN/SCCM guidelines  MONITOR:   Vent status, Labs, Weight trends, TF tolerance, Skin, I & O's  REASON FOR ASSESSMENT:   Ventilator    ASSESSMENT:   86 year old female with PMHx of DM, HTN, HLD, hx MI, CHF admitted on 2/3 with acute on chronic hypoxic respiratory failure secondary to acute diastolic CHF exacerbation. On 2/4 patient suffered respiratory arrest requiring intubation and mechanical ventilation.   Patient is currently intubated on ventilator support MV: 9.9 L/min Temp (24hrs), Avg:98.3 F (36.8 C), Min:97.9 F (36.6 C), Max:99.1 F (37.3 C)  Propofol: N/A  Medications reviewed and include: Novolog 0-9 units Q4hrs, sertraline, vitamin B12 1000 micrograms daily, Precedex gtt, famotidine, fentanyl gtt, Lasix gtt.  Labs reviewed: CBG 35-184, BUN 66, Creatinine 3.95.  Enteral Access: OGT  NUTRITION - FOCUSED PHYSICAL EXAM:  Unable to complete at this time.  Diet Order:   Diet Order            Diet NPO time specified  Diet effective now             EDUCATION NEEDS:   No education needs have been identified at this time  Skin:  Skin Assessment: Reviewed RN Assessment  Last BM:  06/04/2019  Height:   Ht Readings from Last 1 Encounters:  06/06/19 5\' 3"  (1.6 m)   Weight:   Wt Readings from Last 1 Encounters:  06/06/19 70.4 kg   Ideal Body Weight:  52.3 kg  BMI:  Body mass index is 27.51  kg/m.  Estimated Nutritional Needs:   Kcal:  [id number] (PSU 2003b w/ MSJ 1124, Ve 9.9, Tmax 37.3)  Protein:  85-105 grams (1.2-1.5 grams/kg)  Fluid:  1.7 L/day  Jacklynn Barnacle, MS, RD, LDN Pager number available on Amion

## 2019-06-07 NOTE — Progress Notes (Signed)
Progress Note  Patient Name: Lori Stanley Date of Encounter: 06/07/2019  Primary Cardiologist:  Dr. Fletcher Anon  Subjective   Worsening respiratory distress overnight 8 PM unresponsive, CODE BLUE called for respiratory arrest Saturations of 60% Was found to be encephalopathic in the setting of hypoxia, Intubated, moved to the intensive care unit -Remains on Lasix infusion  Inpatient Medications    Scheduled Meds: . aspirin  81 mg Per Tube Daily  . atorvastatin  20 mg Per Tube q1800  . brimonidine  1 drop Both Eyes BID  . carvedilol  12.5 mg Per Tube BID  . chlorhexidine gluconate (MEDLINE KIT)  15 mL Mouth Rinse BID  . Chlorhexidine Gluconate Cloth  6 each Topical Daily  . dextrose  1 ampule Intravenous Once  . dorzolamide-timolol  1 drop Both Eyes BID  . fentaNYL (SUBLIMAZE) injection  25 mcg Intravenous Once  . heparin injection (subcutaneous)  5,000 Units Subcutaneous Q8H  . insulin aspart  0-9 Units Subcutaneous Q4H  . ipratropium-albuterol  3 mL Nebulization QID  . latanoprost  1 drop Left Eye QHS  . mouth rinse  15 mL Mouth Rinse 10 times per day  . olopatadine  1 drop Both Eyes BID  . sertraline  25 mg Per Tube Daily  . sodium chloride flush  3 mL Intravenous Q12H  . vitamin B-12  1,000 mcg Per Tube Daily   Continuous Infusions: . sodium chloride    . dexmedetomidine (PRECEDEX) IV infusion 0.4 mcg/kg/hr (06/07/19 1203)  . famotidine (PEPCID) IV Stopped (06/06/19 2227)  . fentaNYL infusion INTRAVENOUS 200 mcg/hr (06/07/19 1203)  . furosemide (LASIX) infusion 10 mg/hr (06/07/19 1203)   PRN Meds: sodium chloride, acetaminophen, bisacodyl, fentaNYL, hydrALAZINE, midazolam, midazolam, ondansetron (ZOFRAN) IV, sodium chloride flush   Vital Signs    Vitals:   06/07/19 0905 06/07/19 1000 06/07/19 1100 06/07/19 1200  BP: (!) 127/54 (!) 151/58 (!) 152/65 136/67  Pulse: 60 63 63 60  Resp:  20 20 20   Temp:    98.2 F (36.8 C)  TempSrc:    Axillary  SpO2:  100%  99% 94%  Weight:      Height:        Intake/Output Summary (Last 24 hours) at 06/07/2019 1302 Last data filed at 06/07/2019 1203 Gross per 24 hour  Intake 770.67 ml  Output 1085 ml  Net -314.33 ml   Last 3 Weights 06/06/2019 04/10/2019 03/11/2019  Weight (lbs) 155 lb 4.8 oz 137 lb 135 lb  Weight (kg) 70.444 kg 62.143 kg 61.236 kg      Telemetry    Normal sinus rhythm personally Reviewed  ECG    No new tracings - Personally Reviewed  Physical Exam   Constitutional: Sedated, intubated HENT:  Head: Grossly normal Eyes:  no discharge. No scleral icterus.  Neck: No JVD, no carotid bruits  Cardiovascular: Regular rate and rhythm, no murmurs appreciated Pulmonary/Chest: Coarse breath sounds Abdominal: Soft.  no distension.  no tenderness.  Musculoskeletal: Normal range of motion Neurological: Unable to test Skin: Skin warm and dry Psychiatric: normal affect, pleasant  Labs    High Sensitivity Troponin:   Recent Labs  Lab 06/05/19 0102 06/05/19 0227 06/06/19 0621 06/06/19 2023 06/06/19 2145  TROPONINIHS 3 4 3 3 4       Cardiac EnzymesNo results for input(s): TROPONINI in the last 168 hours. No results for input(s): TROPIPOC in the last 168 hours.   Chemistry Recent Labs  Lab 06/05/19 0102 06/06/19 6979 06/06/19 2023 06/07/19 4801  06/07/19 0629  NA 136   < > 137 139 138  K 4.7   < > 4.7 4.2 4.2  CL 102   < > 103 103 103  CO2 25   < > 25 25 26   GLUCOSE 150*   < > 147* 111* 109*  BUN 61*   < > 65* 63* 66*  CREATININE 3.86*   < > 3.74* 3.81* 3.95*  CALCIUM 9.2   < > 9.6 9.5 9.4  PROT 6.7  --  6.3*  --   --   ALBUMIN 3.6  --  3.5 3.3*  --   AST 26  --  26  --   --   ALT 19  --  23  --   --   ALKPHOS 69  --  68  --   --   BILITOT 0.6  --  0.6  --   --   GFRNONAA 10*   < > 10* 10* 10*  GFRAA 12*   < > 12* 12* 11*  ANIONGAP 9   < > 9 11 9    < > = values in this interval not displayed.     Hematology Recent Labs  Lab 06/06/19 0913 06/06/19 2023  06/07/19 0629  WBC 5.5 6.0 6.5  RBC 2.84* 3.28* 2.95*  HGB 7.5* 8.6* 8.0*  HCT 25.4* 29.0* 25.5*  MCV 89.4 88.4 86.4  MCH 26.4 26.2 27.1  MCHC 29.5* 29.7* 31.4  RDW 14.5 14.6 14.4  PLT 246 267 243    BNP Recent Labs  Lab 06/05/19 0102 06/06/19 2023  BNP 577.0* 549.0*     DDimer No results for input(s): DDIMER in the last 168 hours.   Radiology    DG Chest 1 View  Result Date: 06/06/2019 CLINICAL DATA:  86 year old female status post intubation. EXAM: CHEST  1 VIEW COMPARISON:  Chest radiograph dated 06/05/2019. FINDINGS: Endotracheal tube with tip approximately 2.5 cm above the carina. There is vascular congestion and edema as well as left pleural effusion, similar or slightly worsened since the prior radiograph. Pneumonia is not excluded. Clinical correlation is recommended. No pneumothorax. Stable cardiac silhouette. Atherosclerotic calcification of the aorta. No acute osseous pathology. IMPRESSION: 1. Interval placement of an endotracheal tube with tip above the carina. 2. CHF and left effusion similar or slightly worsened. Pneumonia is not excluded. Clinical correlation is recommended. Electronically Signed   By: Anner Crete M.D.   On: 06/06/2019 20:25   DG Chest 1 View  Result Date: 06/05/2019 CLINICAL DATA:  Shortness of breath EXAM: CHEST  1 VIEW COMPARISON:  Film from earlier in the same day. FINDINGS: Cardiac shadow is stable. Aortic calcifications are again seen. Poor inspiratory effort is seen. Persistent vascular congestion is noted with small left pleural effusion again consistent with mild CHF. IMPRESSION: The overall appearance of the chest is stable with changes mild CHF. New left pleural effusion is seen. Electronically Signed   By: Inez Catalina M.D.   On: 06/05/2019 20:13   DG Abd 1 View  Result Date: 06/06/2019 CLINICAL DATA:  OG tube placement EXAM: ABDOMEN - 1 VIEW COMPARISON:  None. FINDINGS: The OG tube projects over the gastric body. The bowel gas pattern  is nonobstructive. Body wall edema is noted. IMPRESSION: OG tube projects over the stomach. Electronically Signed   By: Constance Holster M.D.   On: 06/06/2019 21:51   ECHOCARDIOGRAM COMPLETE  Result Date: 06/06/2019   ECHOCARDIOGRAM REPORT   Patient Name:   Lori KINZIE  Stanley Date of Exam: 06/05/2019 Medical Rec #:  [id number]         Height:       63.0 in Accession #:    [id number]        Weight:       137.0 lb Date of Birth:  [birth year]        BSA:          1.65 m Patient Age:    74 years          BP:           140/64 mmHg Patient Gender: F                 HR:           66 bpm. Exam Location:  [facility] Procedure: 2D Echo, Color Doppler and Cardiac Doppler Indications:     CHF-acute diastolic 786.76  History:         Patient has prior history of Echocardiogram examinations, most                  recent 04/11/2017. Risk Factors:Diabetes and Hypertension.                  Acute heart failure, MI.  Sonographer:     Sherrie Sport RDCS (AE) Referring Phys:  [id number] Arvil Chaco Diagnosing Phys: Ida Rogue MD IMPRESSIONS  1. Left ventricular ejection fraction, by visual estimation, is 60 to 65%. The left ventricle has normal function. There is moderately increased left ventricular hypertrophy.  2. The left ventricle has no regional wall motion abnormalities.  3. Left ventricular diastolic parameters are consistent with Grade II diastolic dysfunction (pseudonormalization).  4. Global right ventricle has normal systolic function.The right ventricular size is normal. No increase in right ventricular wall thickness.  5. Left atrial size was severely dilated.  6. Mildly elevated pulmonary artery systolic pressure.  7. The inferior vena cava is dilated in size with <50% respiratory variability, suggesting right atrial pressure of 15 mmHg.  8. pleural effusion noted on left, 7 cm  9. Small pericardial effusion. FINDINGS  Left Ventricle: Left ventricular ejection fraction, by visual estimation, is 60 to 65%. The left  ventricle has normal function. The left ventricle has no regional wall motion abnormalities. There is moderately increased left ventricular hypertrophy. Left ventricular diastolic parameters are consistent with Grade II diastolic dysfunction (pseudonormalization). Normal left atrial pressure. Right Ventricle: The right ventricular size is normal. No increase in right ventricular wall thickness. Global RV systolic function is has normal systolic function. The tricuspid regurgitant velocity is 2.25 m/s, and with an assumed right atrial pressure  of 15 mmHg, the estimated right ventricular systolic pressure is mildly elevated at 35.2 mmHg. Left Atrium: Left atrial size was severely dilated. Right Atrium: Right atrial size was normal in size Pericardium: A small pericardial effusion is present. Mitral Valve: The mitral valve is normal in structure. Mild mitral valve regurgitation. No evidence of mitral valve stenosis by observation. MV peak gradient, 13.7 mmHg. Tricuspid Valve: The tricuspid valve is normal in structure. Tricuspid valve regurgitation is mild. Aortic Valve: The aortic valve was not well visualized. Aortic valve regurgitation is not visualized. Mild to moderate aortic valve sclerosis/calcification is present, without any evidence of aortic stenosis. Aortic valve mean gradient measures 10.0 mmHg. Aortic valve peak gradient measures 18.1 mmHg. Aortic valve area, by VTI measures 1.93 cm. Pulmonic Valve: The pulmonic valve was normal in structure. Pulmonic valve regurgitation is not visualized.  Pulmonic regurgitation is not visualized. Aorta: The aortic root, ascending aorta and aortic arch are all structurally normal, with no evidence of dilitation or obstruction. Venous: The inferior vena cava is dilated in size with less than 50% respiratory variability, suggesting right atrial pressure of 15 mmHg. IAS/Shunts: No atrial level shunt detected by color flow Doppler. There is no evidence of a patent foramen  ovale. No ventricular septal defect is seen or detected. There is no evidence of an atrial septal defect.  LEFT VENTRICLE PLAX 2D LVIDd:         4.33 cm  Diastology LVIDs:         2.77 cm  LV e' medial:   3.81 cm/s LV PW:         1.19 cm  LV E/e' medial: 38.6 LV IVS:        1.20 cm LVOT diam:     2.00 cm LV SV:         56 ml LV SV Index:   33.30 LVOT Area:     3.14 cm  RIGHT VENTRICLE RV Basal diam:  4.09 cm RV S prime:     12.00 cm/s TAPSE (M-mode): 3.3 cm LEFT ATRIUM              Index       RIGHT ATRIUM           Index LA diam:        4.60 cm  2.79 cm/m  RA Area:     13.70 cm LA Vol (A2C):   139.0 ml 84.42 ml/m RA Volume:   37.30 ml  22.65 ml/m LA Vol (A4C):   66.6 ml  40.45 ml/m LA Biplane Vol: 104.0 ml 63.17 ml/m  AORTIC VALVE                    PULMONIC VALVE AV Area (Vmax):    1.70 cm     PV Vmax:        0.83 m/s AV Area (Vmean):   1.75 cm     PV Peak grad:   2.8 mmHg AV Area (VTI):     1.93 cm     RVOT Peak grad: 4 mmHg AV Vmax:           212.67 cm/s AV Vmean:          146.667 cm/s AV VTI:            0.530 m AV Peak Grad:      18.1 mmHg AV Mean Grad:      10.0 mmHg LVOT Vmax:         115.00 cm/s LVOT Vmean:        81.700 cm/s LVOT VTI:          0.326 m LVOT/AV VTI ratio: 0.62  AORTA Ao Root diam: 2.60 cm MITRAL VALVE                         TRICUSPID VALVE MV Area (PHT): 2.21 cm              TR Peak grad:   20.2 mmHg MV Peak grad:  13.7 mmHg             TR Vmax:        225.00 cm/s MV Mean grad:  5.0 mmHg MV Vmax:       1.85 m/s  SHUNTS MV Vmean:      92.6 cm/s             Systemic VTI:  0.33 m MV VTI:        0.54 m                Systemic Diam: 2.00 cm MV PHT:        99.47 msec MV Decel Time: 343 msec MV E velocity: 147.00 cm/s 103 cm/s MV A velocity: 105.00 cm/s 70.3 cm/s MV E/A ratio:  1.40        1.5  Ida Rogue MD Electronically signed by Ida Rogue MD Signature Date/Time: 06/06/2019/7:41:04 PM    Final     Cardiac Studies   Pending updated echo.  Echo 04/11/2017 -  Left ventricle: The cavity size was normal. There was mild  concentric hypertrophy. Systolic function was normal. The  estimated ejection fraction was in the range of 60% to 65%. Wall  motion was normal; there were no regional wall motion  abnormalities. Doppler parameters are consistent with abnormal  left ventricular relaxation (grade 1 diastolic dysfunction).  - Mitral valve: Calcified annulus. There was mild regurgitation.  - Left atrium: The atrium was mildly dilated.  - Right ventricle: Systolic function was normal.  - Pulmonary arteries: Systolic pressure was within the normal  range.   05/30/2013 NM Myoview No significant wall motion abnormality noted.  Overall, low risk scan.  No significant ischemia.  EF 64%.  LV global function normal.  No EKG changes concerning for ischemia.  Attenuation artifact noted in the study.  Patient Profile     86 y.o. female hx of chronic diastolic heart failure, hypertension, DM2, CKD, and who is being seen today for the evaluation of acute on chronic diastolic heart failure.   Assessment & Plan    Acute on chronic respiratory distress Multifactorial : anemia, diastolic heart failure, mild aortic valve stenosis, renal failure --Now intubated following respiratory arrest yesterday Would continue Lasix infusion, consider blood transfusion Unclear if she is a candidate for dialysis  Acute on chronic renal failure Creatinine 3.95,  On Lasix infusion output is poor  Anemia Consider transfusion, goal hemoglobin 10   Total encounter time more than 25 minutes  Greater than 50% was spent in counseling and coordination of care with the patient     For questions or updates, please contact [place] Please consult [email or web address] for contact info under        Signed, Ida Rogue, MD  06/07/2019, 1:02 PM

## 2019-06-07 NOTE — Progress Notes (Signed)
Pt was transported to vascular from CCU and back while on the vent.

## 2019-06-07 NOTE — Progress Notes (Signed)
Pt returned to unit from perm cath placement. VSS.

## 2019-06-07 NOTE — Progress Notes (Signed)
Pt off unit at this time for perm cath placement.

## 2019-06-07 NOTE — Progress Notes (Signed)
PHARMACIST - PHYSICIAN ORDER COMMUNICATION  CONCERNING: amlodipine and simvastatin  and risk of rhabdomyolysis  DESCRIPTION:  Patients on diltiazem and simvastatin >10 mg/day have reported cases of rhabdomyolysis. Pharmacy is to assess simvastatin dose. If >10 mg, substitute atorvastatin (Lipitor) 1mg  for each 2mg  simvastatin.  This patient is ordered simvastatin 40mg  and amlodipine 5mg .    ACTION TAKEN: Per protocol pharmacy has discontinued the patient's order for simvastatin and replaced it with Atorvastatin 20mg .    Dallie Piles, PharmD Clinical Pharmacist 06/07/2019 7:26 AM

## 2019-06-07 NOTE — Progress Notes (Signed)
RN found unresponsive by nursing staff. NT Roselie Awkward and Billie Lade report that patient was sitting in recliner trying to catch her breath when she suddenly went unresponsive. Other nursing staff notified and code blue called. Patient in respiratory distress with pulse. Patient intubated and transferred to ICU 2 with personal belongings. Family notified by nursing staff. Report given to Hiral, Therapist, sports.

## 2019-06-07 NOTE — Consult Note (Signed)
[facility] Consult Note  MRN : [id number]  Lori Stanley is a 86 y.o. (02-22-1932) female who presents with chief complaint of  Chief Complaint  Patient presents with  . Shortness of Breath   History of Present Illness:  Of note: Information for this consult was obtained from previous epic notation, the patient's primary team, consulting providers and bedside nurse as the patient is critically ill intubated.  The patient is an 86 year old female with multiple medical issues (see below) who originally presented to the [facility] emergency department via EMS from home complaining of progressively worsening shortness of breath.    The patient was admitted for acute on chronic diastolic congestive heart failure with subsequent acute hypoxic respiratory failure and acute on chronic stage IV kidney disease.  Unfortunately, yesterday evening the patient became acutely short of breath and was found in respiratory arrest.  The patient was intubated and transferred to the ICU for further care.   Nephrology was consulted in regard to the patient's acute on chronic renal failure like to initiate dialysis at this time.  The patient does not have an adequate dialysis access for hemodialysis and vascular surgery was consulted by Dr. Juleen China for this matter.   Current Facility-Administered Medications  Medication Dose Route Frequency Provider Last Rate Last Admin  . 0.9 %  sodium chloride infusion  250 mL Intravenous PRN Mansy, Jan A, MD      . acetaminophen (TYLENOL) suppository 650 mg  650 mg Rectal Q4H PRN Awilda Bill, NP      . aspirin chewable tablet 81 mg  81 mg Per Tube Daily Awilda Bill, NP   81 mg at 06/07/19 0905  . atorvastatin (LIPITOR) tablet 20 mg  20 mg Per Tube q1800 Dallie Piles, RPH      . bisacodyl (DULCOLAX) suppository 10 mg  10 mg Rectal Daily PRN Awilda Bill, NP      . brimonidine (ALPHAGAN) 0.2 %  ophthalmic solution 1 drop  1 drop Both Eyes BID Mansy, Jan A, MD   1 drop at 06/07/19 0908  . carvedilol (COREG) tablet 12.5 mg  12.5 mg Per Tube BID Awilda Bill, NP   12.5 mg at 06/07/19 0905  . chlorhexidine gluconate (MEDLINE KIT) (PERIDEX) 0.12 % solution 15 mL  15 mL Mouth Rinse BID Awilda Bill, NP   15 mL at 06/07/19 0746  . Chlorhexidine Gluconate Cloth 2 % PADS 6 each  6 each Topical Daily Awilda Bill, NP   6 each at 06/06/19 2030  . dexmedetomidine (PRECEDEX) 400 MCG/100ML (4 mcg/mL) infusion  0.4-1.2 mcg/kg/hr Intravenous Titrated Awilda Bill, NP 7.04 mL/hr at 06/07/19 1203 0.4 mcg/kg/hr at 06/07/19 1203  . dextrose 50 % solution 50 mL  1 ampule Intravenous Once Ottie Glazier, MD      . dorzolamide-timolol (COSOPT) 22.3-6.8 MG/ML ophthalmic solution 1 drop  1 drop Both Eyes BID Mansy, Jan A, MD   1 drop at 06/07/19 0908  . famotidine (PEPCID) IVPB 20 mg premix  20 mg Intravenous Q24H Awilda Bill, NP   Stopped at 06/06/19 2227  . fentaNYL (SUBLIMAZE) bolus via infusion 25 mcg  25 mcg Intravenous Q15 min PRN Awilda Bill, NP      . fentaNYL (SUBLIMAZE) injection 25 mcg  25 mcg Intravenous Once Awilda Bill, NP      . fentaNYL 2564mg in NS 2529m(1068mml) infusion-PREMIX  25-200 mcg/hr Intravenous Continuous  Awilda Bill, NP 20 mL/hr at 06/07/19 1332 200 mcg/hr at 06/07/19 1332  . furosemide (LASIX) 250 mg in dextrose 5 % 250 mL (1 mg/mL) infusion  10 mg/hr Intravenous Continuous Lang Snow, NP 10 mL/hr at 06/07/19 1203 10 mg/hr at 06/07/19 1203  . heparin injection 5,000 Units  5,000 Units Subcutaneous Q8H Enzo Bi, MD   5,000 Units at 06/07/19 1300  . hydrALAZINE (APRESOLINE) injection 10-20 mg  10-20 mg Intravenous Q4H PRN Awilda Bill, NP   10 mg at 06/06/19 2330  . insulin aspart (novoLOG) injection 0-9 Units  0-9 Units Subcutaneous Q4H Awilda Bill, NP   3 Units at 06/07/19 [phone]  . ipratropium-albuterol (DUONEB) 0.5-2.5 (3)  MG/3ML nebulizer solution 3 mL  3 mL Nebulization QID Enzo Bi, MD   3 mL at 06/07/19 1510  . latanoprost (XALATAN) 0.005 % ophthalmic solution 1 drop  1 drop Left Eye QHS Mansy, Jan A, MD      . MEDLINE mouth rinse  15 mL Mouth Rinse 10 times per day Awilda Bill, NP   15 mL at 06/07/19 1301  . midazolam (VERSED) injection 1 mg  1 mg Intravenous Q15 min PRN Awilda Bill, NP   1 mg at 06/07/19 0522  . midazolam (VERSED) injection 1 mg  1 mg Intravenous Q2H PRN Awilda Bill, NP   1 mg at 06/07/19 0256  . olopatadine (PATANOL) 0.1 % ophthalmic solution 1 drop  1 drop Both Eyes BID Mansy, Jan A, MD   1 drop at 06/07/19 0908  . ondansetron (ZOFRAN) injection 4 mg  4 mg Intravenous Q6H PRN Mansy, Jan A, MD      . sertraline (ZOLOFT) tablet 25 mg  25 mg Per Tube Daily Awilda Bill, NP   25 mg at 06/07/19 0905  . sodium chloride flush (NS) 0.9 % injection 3 mL  3 mL Intravenous Q12H Mansy, Jan A, MD   3 mL at 06/07/19 0909  . sodium chloride flush (NS) 0.9 % injection 3 mL  3 mL Intravenous PRN Mansy, Jan A, MD      . vitamin B-12 (CYANOCOBALAMIN) tablet 1,000 mcg  1,000 mcg Per Tube Daily Awilda Bill, NP   1,000 mcg at 06/07/19 9702   Past Medical History:  Diagnosis Date  . Acute heart failure ([place])   . Chronic low back pain   . Diabetes mellitus without complication ([place])   . Hyperlipidemia   . Hypertension   . MI (myocardial infarction) ([place])   . Obesity    Past Surgical History:  Procedure Laterality Date  . EYE SURGERY    . KNEE SURGERY    . THYROID SURGERY    . VAGINAL HYSTERECTOMY     Social History Social History   Tobacco Use  . Smoking status: Former Smoker    Types: Cigarettes  . Smokeless tobacco: Never Used  . Tobacco comment: Quit in 2015-2016  Substance Use Topics  . Alcohol use: No  . Drug use: No   Family History Family History  Problem Relation Age of Onset  . Heart attack Mother   . Heart disease Sister   . Cancer Sister   Unable to  determine due to acuity of critical illness.  Allergies  Allergen Reactions  . Antihistamines, Chlorpheniramine-Type   . Codeine   . Paxil [Paroxetine]     "makes her feel funny" not in a good way   REVIEW OF SYSTEMS (Negative unless checked)  Constitutional: []Weight loss  []Fever  []Chills Cardiac: []Chest pain   []Chest pressure   []Palpitations   [x]Shortness of breath when laying flat   [x]Shortness of breath at rest   [x]Shortness of breath with exertion. Vascular:  []Pain in legs with walking   []Pain in legs at rest   []Pain in legs when laying flat   []Claudication   []Pain in feet when walking  []Pain in feet at rest  []Pain in feet when laying flat   []History of DVT   []Phlebitis   []Swelling in legs   []Varicose veins   []Non-healing ulcers Pulmonary:   []Uses home oxygen   []Productive cough   []Hemoptysis   []Wheeze  []COPD   []Asthma Neurologic:  []Dizziness  []Blackouts   []Seizures   []History of stroke   []History of TIA  []Aphasia   []Temporary blindness   []Dysphagia   []Weakness or numbness in arms   []Weakness or numbness in legs Musculoskeletal:  []Arthritis   []Joint swelling   []Joint pain   []Low back pain Hematologic:  []Easy bruising  []Easy bleeding   []Hypercoagulable state   []Anemic  []Hepatitis Gastrointestinal:  []Blood in stool   []Vomiting blood  []Gastroesophageal reflux/heartburn   []Difficulty swallowing. Genitourinary:  [x]Chronic kidney disease   []Difficult urination  []Frequent urination  []Burning with urination   []Blood in urine Skin:  []Rashes   []Ulcers   []Wounds Psychological:  []History of anxiety   [] History of major depression.  Physical Examination  Vitals:   06/07/19 1200 06/07/19 1300 06/07/19 1400 06/07/19 1500  BP: 136/67 (!) 141/61 139/63 139/62  Pulse: 60 61 60 64  Resp: _0 Temp: 98.2 F (36.8 C)     TempSrc: Axillary     SpO2: 94% 94% 98% 95%  Weight:      Height:       Body mass index is 27.51 kg/m. Gen:  Critically ill.  Sedated and on vent. Head: [place]/AT, No temporalis wasting. Prominent temp pulse not noted. Ear/Nose/Throat: Hearing grossly intact, nares w/o erythema or drainage, oropharynx w/o Erythema/Exudate Eyes: Sclera non-icteric, conjunctiva clear Neck: Trachea midline.  No JVD.  Pulmonary:  On vent. Good air movement, respirations not labored, equal bilaterally.  Cardiac: RRR, normal S1, S2. Vascular:  Vessel Right Left  Radial Palpable Palpable  Ulnar Palpable Palpable                               Gastrointestinal: soft, non-tender/non-distended. No guarding/reflex.  Musculoskeletal: Extremities without ischemic changes.  No deformity or atrophy. Moderate edema. Neurologic: Sedated on vent.  Unable to assess. Psychiatric: Sedated on vent.  Unable to assess. Dermatologic: No rashes or ulcers noted.  No cellulitis or open wounds. Lymph : No Cervical, Axillary, or Inguinal lymphadenopathy.  CBC Lab Results  Component Value Date   WBC 6.5 06/07/2019   HGB 8.0 (L) 06/07/2019   HCT 25.5 (L) 06/07/2019   MCV 86.4 06/07/2019   PLT 243 06/07/2019   BMET    Component Value Date/Time   NA 138 06/07/2019 0629   NA 143 04/10/2019 1353   NA 132 (L) 05/15/2013 0512   K 4.2 06/07/2019 0629   K 4.0 05/15/2013 0512   CL 103 06/07/2019 0629   CL 99 05/15/2013 0512   CO2 26 06/07/2019 0629   CO2 28 05/15/2013 0512   GLUCOSE 109 (H) 06/07/2019 0629   GLUCOSE 95  05/15/2013 0512   BUN 66 (H) 06/07/2019 0629   BUN 40 (H) 04/10/2019 1353   BUN 80 (H) 05/15/2013 0512   CREATININE 3.95 (H) 06/07/2019 0629   CREATININE 2.27 (H) 05/15/2013 0512   CALCIUM 9.4 06/07/2019 0629   CALCIUM 8.0 (L) 05/15/2013 0512   GFRNONAA 10 (L) 06/07/2019 0629   GFRNONAA 20 (L) 05/15/2013 0512   GFRAA 11 (L) 06/07/2019 0629   GFRAA 23 (L) 05/15/2013 4967   Estimated Creatinine Clearance: 9.8 mL/min (A) (by C-G formula based on SCr of 3.95 mg/dL (H)).  COAG Lab Results  Component Value Date    INR 1.2 06/07/2019   Radiology DG Chest 1 View  Result Date: 06/06/2019 CLINICAL DATA:  86 year old female status post intubation. EXAM: CHEST  1 VIEW COMPARISON:  Chest radiograph dated 06/05/2019. FINDINGS: Endotracheal tube with tip approximately 2.5 cm above the carina. There is vascular congestion and edema as well as left pleural effusion, similar or slightly worsened since the prior radiograph. Pneumonia is not excluded. Clinical correlation is recommended. No pneumothorax. Stable cardiac silhouette. Atherosclerotic calcification of the aorta. No acute osseous pathology. IMPRESSION: 1. Interval placement of an endotracheal tube with tip above the carina. 2. CHF and left effusion similar or slightly worsened. Pneumonia is not excluded. Clinical correlation is recommended. Electronically Signed   By: Anner Crete M.D.   On: 06/06/2019 20:25   DG Chest 1 View  Result Date: 06/05/2019 CLINICAL DATA:  Shortness of breath EXAM: CHEST  1 VIEW COMPARISON:  Film from earlier in the same day. FINDINGS: Cardiac shadow is stable. Aortic calcifications are again seen. Poor inspiratory effort is seen. Persistent vascular congestion is noted with small left pleural effusion again consistent with mild CHF. IMPRESSION: The overall appearance of the chest is stable with changes mild CHF. New left pleural effusion is seen. Electronically Signed   By: Inez Catalina M.D.   On: 06/05/2019 20:13   DG Abd 1 View  Result Date: 06/06/2019 CLINICAL DATA:  OG tube placement EXAM: ABDOMEN - 1 VIEW COMPARISON:  None. FINDINGS: The OG tube projects over the gastric body. The bowel gas pattern is nonobstructive. Body wall edema is noted. IMPRESSION: OG tube projects over the stomach. Electronically Signed   By: Constance Holster M.D.   On: 06/06/2019 21:51   DG Chest Port 1 View  Result Date: 06/05/2019 CLINICAL DATA:  Respiratory distress EXAM: PORTABLE CHEST 1 VIEW COMPARISON:  04/18/2017 FINDINGS: Cardiac shadow is  mildly prominent but accentuated by the portable technique. Vascular congestion increased interstitial markings are noted. No sizable effusion is seen. No bony is noted. IMPRESSION: Changes consistent with early CHF. Electronically Signed   By: Inez Catalina M.D.   On: 06/05/2019 01:17   ECHOCARDIOGRAM COMPLETE  Result Date: 06/06/2019   ECHOCARDIOGRAM REPORT   Patient Name:   Lori Stanley Date of Exam: 06/05/2019 Medical Rec #:  [id number]         Height:       63.0 in Accession #:    [id number]        Weight:       137.0 lb Date of Birth:  05/30/1931        BSA:          1.65 m Patient Age:    65 years          BP:           140/64 mmHg Patient Gender: F  HR:           66 bpm. Exam Location:  [facility] Procedure: 2D Echo, Color Doppler and Cardiac Doppler Indications:     CHF-acute diastolic 557.32  History:         Patient has prior history of Echocardiogram examinations, most                  recent 04/11/2017. Risk Factors:Diabetes and Hypertension.                  Acute heart failure, MI.  Sonographer:     Sherrie Sport RDCS (AE) Referring Phys:  [id number] Arvil Chaco Diagnosing Phys: Ida Rogue MD IMPRESSIONS  1. Left ventricular ejection fraction, by visual estimation, is 60 to 65%. The left ventricle has normal function. There is moderately increased left ventricular hypertrophy.  2. The left ventricle has no regional wall motion abnormalities.  3. Left ventricular diastolic parameters are consistent with Grade II diastolic dysfunction (pseudonormalization).  4. Global right ventricle has normal systolic function.The right ventricular size is normal. No increase in right ventricular wall thickness.  5. Left atrial size was severely dilated.  6. Mildly elevated pulmonary artery systolic pressure.  7. The inferior vena cava is dilated in size with <50% respiratory variability, suggesting right atrial pressure of 15 mmHg.  8. pleural effusion noted on left, 7 cm  9. Small pericardial  effusion. FINDINGS  Left Ventricle: Left ventricular ejection fraction, by visual estimation, is 60 to 65%. The left ventricle has normal function. The left ventricle has no regional wall motion abnormalities. There is moderately increased left ventricular hypertrophy. Left ventricular diastolic parameters are consistent with Grade II diastolic dysfunction (pseudonormalization). Normal left atrial pressure. Right Ventricle: The right ventricular size is normal. No increase in right ventricular wall thickness. Global RV systolic function is has normal systolic function. The tricuspid regurgitant velocity is 2.25 m/s, and with an assumed right atrial pressure  of 15 mmHg, the estimated right ventricular systolic pressure is mildly elevated at 35.2 mmHg. Left Atrium: Left atrial size was severely dilated. Right Atrium: Right atrial size was normal in size Pericardium: A small pericardial effusion is present. Mitral Valve: The mitral valve is normal in structure. Mild mitral valve regurgitation. No evidence of mitral valve stenosis by observation. MV peak gradient, 13.7 mmHg. Tricuspid Valve: The tricuspid valve is normal in structure. Tricuspid valve regurgitation is mild. Aortic Valve: The aortic valve was not well visualized. Aortic valve regurgitation is not visualized. Mild to moderate aortic valve sclerosis/calcification is present, without any evidence of aortic stenosis. Aortic valve mean gradient measures 10.0 mmHg. Aortic valve peak gradient measures 18.1 mmHg. Aortic valve area, by VTI measures 1.93 cm. Pulmonic Valve: The pulmonic valve was normal in structure. Pulmonic valve regurgitation is not visualized. Pulmonic regurgitation is not visualized. Aorta: The aortic root, ascending aorta and aortic arch are all structurally normal, with no evidence of dilitation or obstruction. Venous: The inferior vena cava is dilated in size with less than 50% respiratory variability, suggesting right atrial pressure of  15 mmHg. IAS/Shunts: No atrial level shunt detected by color flow Doppler. There is no evidence of a patent foramen ovale. No ventricular septal defect is seen or detected. There is no evidence of an atrial septal defect.  LEFT VENTRICLE PLAX 2D LVIDd:         4.33 cm  Diastology LVIDs:         2.77 cm  LV e' medial:   3.81 cm/s  LV PW:         1.19 cm  LV E/e' medial: 38.6 LV IVS:        1.20 cm LVOT diam:     2.00 cm LV SV:         56 ml LV SV Index:   33.30 LVOT Area:     3.14 cm  RIGHT VENTRICLE RV Basal diam:  4.09 cm RV S prime:     12.00 cm/s TAPSE (M-mode): 3.3 cm LEFT ATRIUM              Index       RIGHT ATRIUM           Index LA diam:        4.60 cm  2.79 cm/m  RA Area:     13.70 cm LA Vol (A2C):   139.0 ml 84.42 ml/m RA Volume:   37.30 ml  22.65 ml/m LA Vol (A4C):   66.6 ml  40.45 ml/m LA Biplane Vol: 104.0 ml 63.17 ml/m  AORTIC VALVE                    PULMONIC VALVE AV Area (Vmax):    1.70 cm     PV Vmax:        0.83 m/s AV Area (Vmean):   1.75 cm     PV Peak grad:   2.8 mmHg AV Area (VTI):     1.93 cm     RVOT Peak grad: 4 mmHg AV Vmax:           212.67 cm/s AV Vmean:          146.667 cm/s AV VTI:            0.530 m AV Peak Grad:      18.1 mmHg AV Mean Grad:      10.0 mmHg LVOT Vmax:         115.00 cm/s LVOT Vmean:        81.700 cm/s LVOT VTI:          0.326 m LVOT/AV VTI ratio: 0.62  AORTA Ao Root diam: 2.60 cm MITRAL VALVE                         TRICUSPID VALVE MV Area (PHT): 2.21 cm              TR Peak grad:   20.2 mmHg MV Peak grad:  13.7 mmHg             TR Vmax:        225.00 cm/s MV Mean grad:  5.0 mmHg MV Vmax:       1.85 m/s              SHUNTS MV Vmean:      92.6 cm/s             Systemic VTI:  0.33 m MV VTI:        0.54 m                Systemic Diam: 2.00 cm MV PHT:        99.47 msec MV Decel Time: 343 msec MV E velocity: 147.00 cm/s 103 cm/s MV A velocity: 105.00 cm/s 70.3 cm/s MV E/A ratio:  1.40        1.5  Ida Rogue MD Electronically signed by Ida Rogue MD  Signature Date/Time: 06/06/2019/7:41:04 PM    Final  Assessment/Plan The patient is an 86 year old female with multiple medical issues (see below) who originally presented to the [facility] emergency department via EMS from home complaining of progressively worsening shortness of breath.    1.  Acute on chronic kidney disease: Patient has a known history of chronic kidney disease which is unfortunately worsened.  Nephrology has been consulted and would like to initiate dialysis at this time.  The patient does not have an adequate dialysis access to allow for hemodialysis.  Recommend placing a PermCath to allow the patient to dialyze in the inpatient outpatient setting. Awaiting for consent from next of kin.   2.  Respiratory failure: Patient has been intubated and transferred to the ICU for management.  Discussed with Dr. Francene Castle, PA-C  06/07/2019 3:25 PM  This note was created with Dragon medical transcription system.  Any error is purely unintentional

## 2019-06-07 NOTE — Op Note (Signed)
[facility]   OPERATIVE NOTE     PROCEDURE: 1. Insertion of a right IJ tunneled dialysis catheter. 2. Catheter placement and cannulation under ultrasound and fluoroscopic guidance  PRE-OPERATIVE DIAGNOSIS: end-stage renal requiring hemodialysis  POST-OPERATIVE DIAGNOSIS: same as above  SURGEON: [name]  ANESTHESIA: Conscious sedation was administered under my direct supervision by the interventional radiology RN. IV Versed plus fentanyl were utilized. Continuous ECG, pulse oximetry and blood pressure was monitored throughout the entire procedure.  Conscious sedation was for a total of 25 minutes.  ESTIMATED BLOOD LOSS: Minimal  FINDING(S): 1.  Tips of the catheter in the right atrium on fluoroscopy 2.  No obvious pneumothorax on fluoroscopy  SPECIMEN(S):  none  INDICATIONS:   Lori Stanley is a 86 y.o. female  presents with end stage renal disease.  Therefore, the patient requires a tunneled dialysis catheter placement.  The patient is informed of  the risks catheter placement include but are not limited to: bleeding, infection, central venous injury, pneumothorax, possible venous stenosis, possible malpositioning in the venous system, and possible infections related to long-term catheter presence.  The patient was aware of these risks and agreed to proceed.  DESCRIPTION: The patient was taken back to Special Procedure suite.  Prior to sedation, the patient was given IV antibiotics.  After obtaining adequate sedation, the patient was prepped and draped in the standard fashion for a right IJ tunneled dialysis catheter placement.  Appropriate Time Out is called.     The the right neck and chest wall are then infiltrated with 1% Lidocaine with epinepherine.  A 19 cm tip to cuff palindrome catheter is then selected, opened on the back table and prepped. Ultrasound is placed in a sterile sleeve.  Under ultrasound guidance, the right internal jugular  vein is examined and is noted to be echolucent and easily compressible indicating patency.   An image is recorded for the permanent record.  The right internal jugular vein is cannulated with the microneedle under direct ultrasound vissualization.  A Microwire followed by a micro sheath is then inserted without difficulty.   A J-wire was then advanced under fluoroscopic guidance into the inferior vena cava and the wire was secured.  Small counter incision was then made at the wire insertion site. A small pocket was fashioned with blunt dissection to allow easier passage of the cuff.  The dilator and peel-away sheath are then advanced over the wire under fluoroscopic guidance. The catheters and advanced through the peel-away sheath. It is approximated to the right chest wall after verifying the tips at the atrial caval junction and an exit site is selected.  Small incision is made at the selected exit site and the tunneling device was passed subcutaneously to the counter incision. Catheter is then pulled through the subcutaneous tunnel. The catheter is then verified for tip position under fluoroscopy, transected and the hub assembly connected.    Each port was tested by aspirating and flushing.  No resistance was noted.  Each port was then thoroughly flushed with heparinized saline.  The catheter was secured in placed with two interrupted stitches of 0 silk tied to the catheter.  The counter incision was closed with a U-stitch of 4-0 Monocryl.  The insertion site is then cleaned and sterile bandages applied including a Biopatch.  Each port was then packed with concentrated heparin (1000 Units/mL) at the manufacturer recommended volumes to each port.  Sterile caps were applied to each port.  On completion fluoroscopy,  the tips of the catheter were in the right atrium, and there was no evidence of pneumothorax.  COMPLICATIONS: None  CONDITION: Unchanged   [name] [facility] Office: [phone]   06/07/2019, 6:47 PM

## 2019-06-07 NOTE — Progress Notes (Signed)
CRITICAL CARE PROGRESS NOTE    Name: Lori Stanley MRN: [id number] DOB: 1931-06-06     LOS: 2   SUBJECTIVE FINDINGS & SIGNIFICANT EVENTS   Patient description:  86 yo female admitted to the telemetry unit with acute on chronic hypoxic respiratory failure secondary to acute diastolic CHF exacerbation.  On 02/4 pt respiratory arrested requiring mechanical intubation and transfer to ICU  Lines / Drains: PIV  Cultures / Sepsis markers: COVID negative  Antibiotics: none   Protocols / Consultants: Vascular , Nephrology, PCCM, hospitalist    PAST MEDICAL HISTORY   Past Medical History:  Diagnosis Date  . Acute heart failure ([place])   . Chronic low back pain   . Diabetes mellitus without complication ([place])   . Hyperlipidemia   . Hypertension   . MI (myocardial infarction) ([place])   . Obesity      SURGICAL HISTORY   Past Surgical History:  Procedure Laterality Date  . EYE SURGERY    . KNEE SURGERY    . THYROID SURGERY    . VAGINAL HYSTERECTOMY       FAMILY HISTORY   Family History  Problem Relation Age of Onset  . Heart attack Mother   . Heart disease Sister   . Cancer Sister      SOCIAL HISTORY   Social History   Tobacco Use  . Smoking status: Former Smoker    Types: Cigarettes  . Smokeless tobacco: Never Used  . Tobacco comment: Quit in 2015-2016  Substance Use Topics  . Alcohol use: No  . Drug use: No     MEDICATIONS   Current Medication:  Current Facility-Administered Medications:  .  0.9 %  sodium chloride infusion, 250 mL, Intravenous, PRN, Mansy, Jan A, MD .  acetaminophen (TYLENOL) suppository 650 mg, 650 mg, Rectal, Q4H PRN, Awilda Bill, NP .  aspirin chewable tablet 81 mg, 81 mg, Per Tube, Daily, Awilda Bill, NP, 81 mg at 06/07/19 0905 .   atorvastatin (LIPITOR) tablet 20 mg, 20 mg, Per Tube, q1800, Dallie Piles, RPH .  bisacodyl (DULCOLAX) suppository 10 mg, 10 mg, Rectal, Daily PRN, Awilda Bill, NP .  brimonidine (ALPHAGAN) 0.2 % ophthalmic solution 1 drop, 1 drop, Both Eyes, BID, Mansy, Jan A, MD, 1 drop at 06/07/19 0908 .  carvedilol (COREG) tablet 12.5 mg, 12.5 mg, Per Tube, BID, Awilda Bill, NP, 12.5 mg at 06/07/19 0905 .  chlorhexidine gluconate (MEDLINE KIT) (PERIDEX) 0.12 % solution 15 mL, 15 mL, Mouth Rinse, BID, Blakeney, Dana G, NP, 15 mL at 06/07/19 0746 .  Chlorhexidine Gluconate Cloth 2 % PADS 6 each, 6 each, Topical, Daily, Awilda Bill, NP, 6 each at 06/06/19 2030 .  dexmedetomidine (PRECEDEX) 400 MCG/100ML (4 mcg/mL) infusion, 0.4-1.2 mcg/kg/hr, Intravenous, Titrated, Blakeney, Dreama Saa, NP, Last Rate: 7.04 mL/hr at 06/07/19 0817, 0.4 mcg/kg/hr at 06/07/19 0817 .  dorzolamide-timolol (COSOPT) 22.3-6.8 MG/ML ophthalmic solution 1 drop, 1 drop, Both Eyes, BID, Mansy, Jan A, MD, 1 drop at 06/07/19 0908 .  famotidine (PEPCID) IVPB 20 mg premix, 20 mg, Intravenous, Q24H, Awilda Bill, NP, Stopped at 06/06/19 2227 .  fentaNYL (SUBLIMAZE) bolus via infusion 25 mcg, 25 mcg, Intravenous, Q15 min PRN, Awilda Bill, NP .  fentaNYL (SUBLIMAZE) injection 25 mcg, 25 mcg, Intravenous, Once, Blakeney, Dana G, NP .  fentaNYL 25110mg in NS 2563m(1073mml) infusion-PREMIX, 25-200 mcg/hr, Intravenous, Continuous, Blakeney, DanDreama SaaP, Last Rate: 20 mL/hr at 06/07/19 0817, 200 mcg/hr at  06/07/19 0817 .  furosemide (LASIX) 250 mg in dextrose 5 % 250 mL (1 mg/mL) infusion, 10 mg/hr, Intravenous, Continuous, Ouma, Bing Neighbors, NP, Last Rate: 10 mL/hr at 06/07/19 0817, 10 mg/hr at 06/07/19 0817 .  heparin injection 5,000 Units, 5,000 Units, Subcutaneous, Q8H, Enzo Bi, MD, 5,000 Units at 06/07/19 0441 .  hydrALAZINE (APRESOLINE) injection 10-20 mg, 10-20 mg, Intravenous, Q4H PRN, Awilda Bill, NP, 10 mg at  06/06/19 2330 .  insulin aspart (novoLOG) injection 0-9 Units, 0-9 Units, Subcutaneous, Q4H, Awilda Bill, NP, 3 Units at 06/07/19 [phone] .  ipratropium-albuterol (DUONEB) 0.5-2.5 (3) MG/3ML nebulizer solution 3 mL, 3 mL, Nebulization, QID, Enzo Bi, MD, 3 mL at 06/07/19 0746 .  latanoprost (XALATAN) 0.005 % ophthalmic solution 1 drop, 1 drop, Left Eye, QHS, Mansy, Jan A, MD .  MEDLINE mouth rinse, 15 mL, Mouth Rinse, 10 times per day, Awilda Bill, NP, 15 mL at 06/07/19 0907 .  midazolam (VERSED) injection 1 mg, 1 mg, Intravenous, Q15 min PRN, Awilda Bill, NP, 1 mg at 06/07/19 0522 .  midazolam (VERSED) injection 1 mg, 1 mg, Intravenous, Q2H PRN, Awilda Bill, NP, 1 mg at 06/07/19 0256 .  olopatadine (PATANOL) 0.1 % ophthalmic solution 1 drop, 1 drop, Both Eyes, BID, Mansy, Jan A, MD, 1 drop at 06/07/19 0908 .  ondansetron (ZOFRAN) injection 4 mg, 4 mg, Intravenous, Q6H PRN, Mansy, Jan A, MD .  sertraline (ZOLOFT) tablet 25 mg, 25 mg, Per Tube, Daily, Awilda Bill, NP, 25 mg at 06/07/19 0905 .  sodium chloride flush (NS) 0.9 % injection 3 mL, 3 mL, Intravenous, Q12H, Mansy, Jan A, MD, 3 mL at 06/07/19 0909 .  sodium chloride flush (NS) 0.9 % injection 3 mL, 3 mL, Intravenous, PRN, Mansy, Jan A, MD .  vitamin B-12 (CYANOCOBALAMIN) tablet 1,000 mcg, 1,000 mcg, Per Tube, Daily, Awilda Bill, NP, 1,000 mcg at 06/07/19 6770    ALLERGIES   Antihistamines, chlorpheniramine-type; Codeine; and Paxil [paroxetine]    REVIEW OF SYSTEMS     Unable to obtain due to mechanical ventilation  PHYSICAL EXAMINATION   Vital Signs: Temp:  [97.9 F (36.6 C)-99.1 F (37.3 C)] 99.1 F (37.3 C) (02/05 0800) Pulse Rate:  [53-87] 60 (02/05 0905) Resp:  [17-25] 20 (02/05 0900) BP: (105-199)/(45-85) 127/54 (02/05 0905) SpO2:  [64 %-100 %] 98 % (02/05 0900) FiO2 (%):  [40 %-50 %] 40 % (02/05 0800)  GENERAL: Chronically ill-appearing HEAD: Normocephalic, atraumatic.  EYES: Pupils  equal, round, reactive to light.  No scleral icterus.  MOUTH: Moist mucosal membrane. NECK: Supple. No thyromegaly. No nodules. No JVD.  PULMONARY: Bibasilar crackles without wheezing CARDIOVASCULAR: S1 and S2. Regular rate and rhythm. No murmurs, rubs, or gallops.  GASTROINTESTINAL: Soft, nontender, non-distended. No masses. Positive bowel sounds. No hepatosplenomegaly.  MUSCULOSKELETAL: No swelling, clubbing, or edema.  NEUROLOGIC: Intubated on mechanical ventilation with GCS of 4 SKIN:intact,warm,dry   PERTINENT DATA     Infusions: . sodium chloride    . dexmedetomidine (PRECEDEX) IV infusion 0.4 mcg/kg/hr (06/07/19 0817)  . famotidine (PEPCID) IV Stopped (06/06/19 2227)  . fentaNYL infusion INTRAVENOUS 200 mcg/hr (06/07/19 0817)  . furosemide (LASIX) infusion 10 mg/hr (06/07/19 0817)   Scheduled Medications: . aspirin  81 mg Per Tube Daily  . atorvastatin  20 mg Per Tube q1800  . brimonidine  1 drop Both Eyes BID  . carvedilol  12.5 mg Per Tube BID  . chlorhexidine gluconate (MEDLINE KIT)  15 mL Mouth  Rinse BID  . Chlorhexidine Gluconate Cloth  6 each Topical Daily  . dorzolamide-timolol  1 drop Both Eyes BID  . fentaNYL (SUBLIMAZE) injection  25 mcg Intravenous Once  . heparin injection (subcutaneous)  5,000 Units Subcutaneous Q8H  . insulin aspart  0-9 Units Subcutaneous Q4H  . ipratropium-albuterol  3 mL Nebulization QID  . latanoprost  1 drop Left Eye QHS  . mouth rinse  15 mL Mouth Rinse 10 times per day  . olopatadine  1 drop Both Eyes BID  . sertraline  25 mg Per Tube Daily  . sodium chloride flush  3 mL Intravenous Q12H  . vitamin B-12  1,000 mcg Per Tube Daily   PRN Medications: sodium chloride, acetaminophen, bisacodyl, fentaNYL, hydrALAZINE, midazolam, midazolam, ondansetron (ZOFRAN) IV, sodium chloride flush Hemodynamic parameters:   Intake/Output: 02/04 0701 - 02/05 0700 In: 998.4 [P.O.:720; I.V.:228.4; IV Piggyback:50] Out: 1475  [Urine:1475]  Ventilator  Settings: Vent Mode: PRVC FiO2 (%):  [40 %-50 %] 40 % Set Rate:  [20 bmp] 20 bmp Vt Set:  [500 mL] 500 mL PEEP:  [5 cmH20] 5 cmH20 Plateau Pressure:  [17 [id number] cmH20] 17 cmH20    LAB RESULTS:  Basic Metabolic Panel: Recent Labs  Lab 06/05/19 0102 06/05/19 0102 06/06/19 3299 06/06/19 2426 06/06/19 0913 06/06/19 2023 06/06/19 2023 06/07/19 0612 06/07/19 0629  NA 136  --  137  --   --  137  --  139 138  K 4.7   < > 4.6   < >  --  4.7   < > 4.2 4.2  CL 102  --  104  --   --  103  --  103 103  CO2 25  --  27  --   --  25  --  25 26  GLUCOSE 150*  --  96  --   --  147*  --  111* 109*  BUN 61*  --  66*  --   --  65*  --  63* 66*  CREATININE 3.86*  --  3.95*  --   --  3.74*  --  3.81* 3.95*  CALCIUM 9.2  --  9.2  --   --  9.6  --  9.5 9.4  MG  --   --   --   --  2.5*  --   --   --  2.6*  PHOS  --   --   --   --   --   --   --  4.3  --    < > = values in this interval not displayed.   Liver Function Tests: Recent Labs  Lab 06/05/19 0102 06/06/19 2023 06/07/19 0612  AST 26 26  --   ALT 19 23  --   ALKPHOS 69 68  --   BILITOT 0.6 0.6  --   PROT 6.7 6.3*  --   ALBUMIN 3.6 3.5 3.3*   No results for input(s): LIPASE, AMYLASE in the last 168 hours. No results for input(s): AMMONIA in the last 168 hours. CBC: Recent Labs  Lab 06/05/19 0102 06/05/19 0647 06/06/19 0913 06/06/19 2023 06/07/19 0629  WBC 7.1 5.3 5.5 6.0 6.5  NEUTROABS  --  4.3  --  5.2  --   HGB 8.1* 7.1* 7.5* 8.6* 8.0*  HCT 26.3* 23.6* 25.4* 29.0* 25.5*  MCV 87.1 88.7 89.4 88.4 86.4  PLT 266 210 246 267 243   Cardiac Enzymes: No results for input(s): CKTOTAL, CKMB, CKMBINDEX,  TROPONINI in the last 168 hours. BNP: Invalid input(s): POCBNP CBG: Recent Labs  Lab 06/06/19 1657 06/06/19 2015 06/06/19 2232 06/07/19 0435 06/07/19 0720  GLUCAP 122* 137* 154* 131* 86     IMAGING RESULTS:  Imaging: DG Chest 1 View  Result Date: 06/06/2019 CLINICAL DATA:   86 year old female status post intubation. EXAM: CHEST  1 VIEW COMPARISON:  Chest radiograph dated 06/05/2019. FINDINGS: Endotracheal tube with tip approximately 2.5 cm above the carina. There is vascular congestion and edema as well as left pleural effusion, similar or slightly worsened since the prior radiograph. Pneumonia is not excluded. Clinical correlation is recommended. No pneumothorax. Stable cardiac silhouette. Atherosclerotic calcification of the aorta. No acute osseous pathology. IMPRESSION: 1. Interval placement of an endotracheal tube with tip above the carina. 2. CHF and left effusion similar or slightly worsened. Pneumonia is not excluded. Clinical correlation is recommended. Electronically Signed   By: Anner Crete M.D.   On: 06/06/2019 20:25   DG Chest 1 View  Result Date: 06/05/2019 CLINICAL DATA:  Shortness of breath EXAM: CHEST  1 VIEW COMPARISON:  Film from earlier in the same day. FINDINGS: Cardiac shadow is stable. Aortic calcifications are again seen. Poor inspiratory effort is seen. Persistent vascular congestion is noted with small left pleural effusion again consistent with mild CHF. IMPRESSION: The overall appearance of the chest is stable with changes mild CHF. New left pleural effusion is seen. Electronically Signed   By: Inez Catalina M.D.   On: 06/05/2019 20:13   DG Abd 1 View  Result Date: 06/06/2019 CLINICAL DATA:  OG tube placement EXAM: ABDOMEN - 1 VIEW COMPARISON:  None. FINDINGS: The OG tube projects over the gastric body. The bowel gas pattern is nonobstructive. Body wall edema is noted. IMPRESSION: OG tube projects over the stomach. Electronically Signed   By: Constance Holster M.D.   On: 06/06/2019 21:51   ECHOCARDIOGRAM COMPLETE  Result Date: 06/06/2019   ECHOCARDIOGRAM REPORT   Patient Name:   KAHLI MAYON Date of Exam: 06/05/2019 Medical Rec #:  [id number]         Height:       63.0 in Accession #:    [id number]        Weight:       137.0 lb Date of Birth:   Feb 08, 1932        BSA:          1.65 m Patient Age:    41 years          BP:           140/64 mmHg Patient Gender: F                 HR:           66 bpm. Exam Location:  [facility] Procedure: 2D Echo, Color Doppler and Cardiac Doppler Indications:     CHF-acute diastolic 259.56  History:         Patient has prior history of Echocardiogram examinations, most                  recent 04/11/2017. Risk Factors:Diabetes and Hypertension.                  Acute heart failure, MI.  Sonographer:     Sherrie Sport RDCS (AE) Referring Phys:  [id number] Arvil Chaco Diagnosing Phys: Ida Rogue MD IMPRESSIONS  1. Left ventricular ejection fraction, by visual estimation, is 60 to 65%. The left ventricle has  normal function. There is moderately increased left ventricular hypertrophy.  2. The left ventricle has no regional wall motion abnormalities.  3. Left ventricular diastolic parameters are consistent with Grade II diastolic dysfunction (pseudonormalization).  4. Global right ventricle has normal systolic function.The right ventricular size is normal. No increase in right ventricular wall thickness.  5. Left atrial size was severely dilated.  6. Mildly elevated pulmonary artery systolic pressure.  7. The inferior vena cava is dilated in size with <50% respiratory variability, suggesting right atrial pressure of 15 mmHg.  8. pleural effusion noted on left, 7 cm  9. Small pericardial effusion. FINDINGS  Left Ventricle: Left ventricular ejection fraction, by visual estimation, is 60 to 65%. The left ventricle has normal function. The left ventricle has no regional wall motion abnormalities. There is moderately increased left ventricular hypertrophy. Left ventricular diastolic parameters are consistent with Grade II diastolic dysfunction (pseudonormalization). Normal left atrial pressure. Right Ventricle: The right ventricular size is normal. No increase in right ventricular wall thickness. Global RV systolic function is has normal  systolic function. The tricuspid regurgitant velocity is 2.25 m/s, and with an assumed right atrial pressure  of 15 mmHg, the estimated right ventricular systolic pressure is mildly elevated at 35.2 mmHg. Left Atrium: Left atrial size was severely dilated. Right Atrium: Right atrial size was normal in size Pericardium: A small pericardial effusion is present. Mitral Valve: The mitral valve is normal in structure. Mild mitral valve regurgitation. No evidence of mitral valve stenosis by observation. MV peak gradient, 13.7 mmHg. Tricuspid Valve: The tricuspid valve is normal in structure. Tricuspid valve regurgitation is mild. Aortic Valve: The aortic valve was not well visualized. Aortic valve regurgitation is not visualized. Mild to moderate aortic valve sclerosis/calcification is present, without any evidence of aortic stenosis. Aortic valve mean gradient measures 10.0 mmHg. Aortic valve peak gradient measures 18.1 mmHg. Aortic valve area, by VTI measures 1.93 cm. Pulmonic Valve: The pulmonic valve was normal in structure. Pulmonic valve regurgitation is not visualized. Pulmonic regurgitation is not visualized. Aorta: The aortic root, ascending aorta and aortic arch are all structurally normal, with no evidence of dilitation or obstruction. Venous: The inferior vena cava is dilated in size with less than 50% respiratory variability, suggesting right atrial pressure of 15 mmHg. IAS/Shunts: No atrial level shunt detected by color flow Doppler. There is no evidence of a patent foramen ovale. No ventricular septal defect is seen or detected. There is no evidence of an atrial septal defect.  LEFT VENTRICLE PLAX 2D LVIDd:         4.33 cm  Diastology LVIDs:         2.77 cm  LV e' medial:   3.81 cm/s LV PW:         1.19 cm  LV E/e' medial: 38.6 LV IVS:        1.20 cm LVOT diam:     2.00 cm LV SV:         56 ml LV SV Index:   33.30 LVOT Area:     3.14 cm  RIGHT VENTRICLE RV Basal diam:  4.09 cm RV S prime:     12.00 cm/s  TAPSE (M-mode): 3.3 cm LEFT ATRIUM              Index       RIGHT ATRIUM           Index LA diam:        4.60 cm  2.79 cm/m  RA  Area:     13.70 cm LA Vol (A2C):   139.0 ml 84.42 ml/m RA Volume:   37.30 ml  22.65 ml/m LA Vol (A4C):   66.6 ml  40.45 ml/m LA Biplane Vol: 104.0 ml 63.17 ml/m  AORTIC VALVE                    PULMONIC VALVE AV Area (Vmax):    1.70 cm     PV Vmax:        0.83 m/s AV Area (Vmean):   1.75 cm     PV Peak grad:   2.8 mmHg AV Area (VTI):     1.93 cm     RVOT Peak grad: 4 mmHg AV Vmax:           212.67 cm/s AV Vmean:          146.667 cm/s AV VTI:            0.530 m AV Peak Grad:      18.1 mmHg AV Mean Grad:      10.0 mmHg LVOT Vmax:         115.00 cm/s LVOT Vmean:        81.700 cm/s LVOT VTI:          0.326 m LVOT/AV VTI ratio: 0.62  AORTA Ao Root diam: 2.60 cm MITRAL VALVE                         TRICUSPID VALVE MV Area (PHT): 2.21 cm              TR Peak grad:   20.2 mmHg MV Peak grad:  13.7 mmHg             TR Vmax:        225.00 cm/s MV Mean grad:  5.0 mmHg MV Vmax:       1.85 m/s              SHUNTS MV Vmean:      92.6 cm/s             Systemic VTI:  0.33 m MV VTI:        0.54 m                Systemic Diam: 2.00 cm MV PHT:        99.47 msec MV Decel Time: 343 msec MV E velocity: 147.00 cm/s 103 cm/s MV A velocity: 105.00 cm/s 70.3 cm/s MV E/A ratio:  1.40        1.5  Ida Rogue MD Electronically signed by Ida Rogue MD Signature Date/Time: 06/06/2019/7:41:04 PM    Final       ASSESSMENT AND PLAN    -Multidisciplinary rounds held today  Severe acute Hypoxic Respiratory Failure -Due to acute on chronic decompensated diastolic CHF -Currently on mechanical ventilation post respiratory etiology of cardiac arrest -Lasix GTT -Strict I's and O's with Foley catheter -continue Full MV support -continue Bronchodilator Therapy -Wean Fio2 and PEEP as tolerated -will perform SAT/SBT when respiratory parameters are met   Status post cardiac arrest likely due to  respiratory failure with hypoxemia -Cardiology on case appreciate input -Lasix drip currently ICU telemetry monitoring  Renal Failure acute on chronic stage V with proteinuria Nephrology on case-appreciate input -Possible EPO and HD -Plan for subclavian port insertion with vascular surgery -follow chem 7 -follow UO -continue Foley Catheter-assess need daily    ID -continue IV  abx as prescibed -follow up cultures  GI/Nutrition GI PROPHYLAXIS as indicated DIET-->TF's as tolerated Constipation protocol as indicated  ENDO - ICU hypoglycemic\Hyperglycemia protocol -check FSBS per protocol   ELECTROLYTES -follow labs as needed -replace as needed -pharmacy consultation   DVT/GI PRX ordered -SCDs  TRANSFUSIONS AS NEEDED MONITOR FSBS ASSESS the need for LABS as needed   Critical care provider statement:    Critical care time (minutes):  33   Critical care time was exclusive of:  Separately billable procedures and treating other patients   Critical care was necessary to treat or prevent imminent or life-threatening deterioration of the following conditions:   Severe acute hypoxemic respiratory failure, status post cardiac arrest, acute on chronic renal failure, essential hypertension, diabetes mellitus, anemia, hyperparathyroidism, multiple comorbid conditions   Critical care was time spent personally by me on the following activities:  Development of treatment plan with patient or surrogate, discussions with consultants, evaluation of patient's response to treatment, examination of patient, obtaining history from patient or surrogate, ordering and performing treatments and interventions, ordering and review of laboratory studies and re-evaluation of patient's condition.  I assumed direction of critical care for this patient from another provider in my specialty: no    This document was prepared using Dragon voice recognition software and may include unintentional dictation  errors.    Ottie Glazier, M.D.  Division of [place]

## 2019-06-08 ENCOUNTER — Inpatient Hospital Stay: Payer: Medicare Other

## 2019-06-08 DIAGNOSIS — I5033 Acute on chronic diastolic (congestive) heart failure: Secondary | ICD-10-CM

## 2019-06-08 DIAGNOSIS — J9602 Acute respiratory failure with hypercapnia: Secondary | ICD-10-CM

## 2019-06-08 LAB — CBC
HCT: 26.6 % — ABNORMAL LOW (ref 36.0–46.0)
Hemoglobin: 8.8 g/dL — ABNORMAL LOW (ref 12.0–15.0)
MCH: 27.2 pg (ref 26.0–34.0)
MCHC: 33.1 g/dL (ref 30.0–36.0)
MCV: 82.4 fL (ref 80.0–100.0)
Platelets: 263 10*3/uL (ref 150–400)
RBC: 3.23 MIL/uL — ABNORMAL LOW (ref 3.87–5.11)
RDW: 14.5 % (ref 11.5–15.5)
WBC: 6.9 10*3/uL (ref 4.0–10.5)
nRBC: 0 % (ref 0.0–0.2)

## 2019-06-08 LAB — MAGNESIUM: Magnesium: 2.4 mg/dL (ref 1.7–2.4)

## 2019-06-08 LAB — BASIC METABOLIC PANEL
Anion gap: 11 (ref 5–15)
BUN: 65 mg/dL — ABNORMAL HIGH (ref 8–23)
CO2: 24 mmol/L (ref 22–32)
Calcium: 9.3 mg/dL (ref 8.9–10.3)
Chloride: 104 mmol/L (ref 98–111)
Creatinine, Ser: 3.75 mg/dL — ABNORMAL HIGH (ref 0.44–1.00)
GFR calc Af Amer: 12 mL/min — ABNORMAL LOW (ref >60)
GFR calc non Af Amer: 10 mL/min — ABNORMAL LOW (ref >60)
Glucose, Bld: 113 mg/dL — ABNORMAL HIGH (ref 70–99)
Potassium: 3.7 mmol/L (ref 3.5–5.1)
Sodium: 139 mmol/L (ref 135–145)

## 2019-06-08 LAB — GLUCOSE, CAPILLARY
Glucose-Capillary: 110 mg/dL — ABNORMAL HIGH (ref 70–99)
Glucose-Capillary: 125 mg/dL — ABNORMAL HIGH (ref 70–99)
Glucose-Capillary: 140 mg/dL — ABNORMAL HIGH (ref 70–99)
Glucose-Capillary: 154 mg/dL — ABNORMAL HIGH (ref 70–99)
Glucose-Capillary: 153 mg/dL — ABNORMAL HIGH (ref 70–99)
Glucose-Capillary: 109 mg/dL — ABNORMAL HIGH (ref 70–99)

## 2019-06-08 LAB — PARATHYROID HORMONE, INTACT (NO CA): PTH: 148 pg/mL — ABNORMAL HIGH (ref 15–65)

## 2019-06-08 MED ORDER — MIDAZOLAM HCL 2 MG/2ML IJ SOLN
2.0000 mg | Freq: Once | INTRAMUSCULAR | Status: AC
  Administered 2019-06-08: 23:00:00 2 mg via INTRAVENOUS

## 2019-06-08 MED ORDER — MIDAZOLAM HCL 2 MG/2ML IJ SOLN
INTRAMUSCULAR | Status: AC
  Filled 2019-06-08: qty 2

## 2019-06-08 MED ORDER — EPOETIN ALFA 10000 UNIT/ML IJ SOLN
10000.0000 [IU] | INTRAMUSCULAR | Status: DC
  Administered 2019-06-12: 11:00:00 10000 [IU] via SUBCUTANEOUS
  Filled 2019-06-08 (×2): qty 1

## 2019-06-08 NOTE — Progress Notes (Signed)
Progress Note  Patient Name: Lori Stanley Date of Encounter: 06/08/2019  Primary Cardiologist:  Dr. Fletcher Anon  Subjective   Intubated and sedated this AM.  Inpatient Medications    Scheduled Meds: . aspirin  81 mg Per Tube Daily  . atorvastatin  20 mg Per Tube q1800  . brimonidine  1 drop Both Eyes BID  . carvedilol  12.5 mg Per Tube BID  . chlorhexidine gluconate (MEDLINE KIT)  15 mL Mouth Rinse BID  . Chlorhexidine Gluconate Cloth  6 each Topical Daily  . dorzolamide-timolol  1 drop Both Eyes BID  . epoetin (EPOGEN/PROCRIT) injection  10,000 Units Subcutaneous Weekly  . insulin aspart  0-9 Units Subcutaneous Q4H  . ipratropium-albuterol  3 mL Nebulization QID  . latanoprost  1 drop Left Eye QHS  . mouth rinse  15 mL Mouth Rinse 10 times per day  . olopatadine  1 drop Both Eyes BID  . sertraline  25 mg Per Tube Daily  . sodium chloride flush  3 mL Intravenous Q12H  . vitamin B-12  1,000 mcg Per Tube Daily   Continuous Infusions: . sodium chloride    . dexmedetomidine (PRECEDEX) IV infusion 1.2 mcg/kg/hr (06/08/19 1327)  . famotidine (PEPCID) IV Stopped (06/07/19 2201)  . fentaNYL infusion INTRAVENOUS 150 mcg/hr (06/08/19 1300)  . furosemide (LASIX) infusion 10 mg/hr (06/08/19 1497)   PRN Meds: sodium chloride, acetaminophen, bisacodyl, hydrALAZINE, ondansetron (ZOFRAN) IV, sodium chloride flush   Vital Signs    Vitals:   06/08/19 1030 06/08/19 1100 06/08/19 1132 06/08/19 1200  BP:  (!) 175/70  (!) 149/74  Pulse: (!) 58 60  68  Resp:      Temp: (!) 95 F (35 C)   (!) 95 F (35 C)  TempSrc: Axillary   Axillary  SpO2: 100% 97% 96% 96%  Weight:      Height:        Intake/Output Summary (Last 24 hours) at 06/08/2019 1440 Last data filed at 06/08/2019 1200 Gross per 24 hour  Intake 714.01 ml  Output 3150 ml  Net -2435.99 ml   Last 3 Weights 06/08/2019 06/06/2019 04/10/2019  Weight (lbs) 148 lb 5.9 oz 155 lb 4.8 oz 137 lb  Weight (kg) 67.3 kg 70.444 kg 62.143 kg       Telemetry    Sinus rhythm, with frequent PVCs-personally Reviewed  ECG    No new tracings since 2/3- Personally Reviewed  Physical Exam   GEN: intubated and sedates HEENT: endotracheal tube in place NECK: No JVD visualized, but difficult body habitus CARDIAC: regular rhythm, normal S1 and S2, no rubs or gallops. 1/6 SEM. VASCULAR: Radial pulses 2+ bilaterally.  RESPIRATORY:  Coarse breath sounds, mechanical ventilation ABDOMEN: Soft, non-tender, non-distended SKIN: Warm and dry, bilateral 1+ LE edema NEUROLOGIC:  sedated PSYCHIATRIC:  sedated  Labs    High Sensitivity Troponin:   Recent Labs  Lab 06/05/19 0102 06/05/19 0227 06/06/19 0621 06/06/19 2023 06/06/19 2145  TROPONINIHS _0 Cardiac EnzymesNo results for input(s): TROPONINI in the last 168 hours. No results for input(s): TROPIPOC in the last 168 hours.   Chemistry Recent Labs  Lab 06/05/19 0102 06/06/19 0621 06/06/19 2023 06/06/19 2023 06/07/19 0612 06/07/19 0629 06/08/19 0326  NA 136   < > 137   < > 139 138 139  K 4.7   < > 4.7   < > 4.2 4.2 3.7  CL 102   < > 103   < >  103 103 104  CO2 25   < > 25   < > _0 GLUCOSE 150*   < > 147*   < > 111* 109* 113*  BUN 61*   < > 65*   < > 63* 66* 65*  CREATININE 3.86*   < > 3.74*   < > 3.81* 3.95* 3.75*  CALCIUM 9.2   < > 9.6   < > 9.5 9.4 9.3  PROT 6.7  --  6.3*  --   --   --   --   ALBUMIN 3.6  --  3.5  --  3.3*  --   --   AST 26  --  26  --   --   --   --   ALT 19  --  23  --   --   --   --   ALKPHOS 69  --  68  --   --   --   --   BILITOT 0.6  --  0.6  --   --   --   --   GFRNONAA 10*   < > 10*   < > 10* 10* 10*  GFRAA 12*   < > 12*   < > 12* 11* 12*  ANIONGAP 9   < > 9   < > _1 < > = values in this interval not displayed.     Hematology Recent Labs  Lab 06/06/19 2023 06/07/19 0629 06/08/19 0326  WBC 6.0 6.5 6.9  RBC 3.28* 2.95* 3.23*  HGB 8.6* 8.0* 8.8*  HCT 29.0* 25.5* 26.6*  MCV 88.4 86.4 82.4  MCH 26.2 27.1  27.2  MCHC 29.7* 31.4 33.1  RDW 14.6 14.4 14.5  PLT 267 243 263    BNP Recent Labs  Lab 06/05/19 0102 06/06/19 2023  BNP 577.0* 549.0*     DDimer No results for input(s): DDIMER in the last 168 hours.   Radiology    DG Chest 1 View  Result Date: 06/06/2019 CLINICAL DATA:  86 year old female status post intubation. EXAM: CHEST  1 VIEW COMPARISON:  Chest radiograph dated 06/05/2019. FINDINGS: Endotracheal tube with tip approximately 2.5 cm above the carina. There is vascular congestion and edema as well as left pleural effusion, similar or slightly worsened since the prior radiograph. Pneumonia is not excluded. Clinical correlation is recommended. No pneumothorax. Stable cardiac silhouette. Atherosclerotic calcification of the aorta. No acute osseous pathology. IMPRESSION: 1. Interval placement of an endotracheal tube with tip above the carina. 2. CHF and left effusion similar or slightly worsened. Pneumonia is not excluded. Clinical correlation is recommended. Electronically Signed   By: Anner Crete M.D.   On: 06/06/2019 20:25   DG Abd 1 View  Result Date: 06/06/2019 CLINICAL DATA:  OG tube placement EXAM: ABDOMEN - 1 VIEW COMPARISON:  None. FINDINGS: The OG tube projects over the gastric body. The bowel gas pattern is nonobstructive. Body wall edema is noted. IMPRESSION: OG tube projects over the stomach. Electronically Signed   By: Constance Holster M.D.   On: 06/06/2019 21:51   PERIPHERAL VASCULAR CATHETERIZATION  Result Date: 06/07/2019 See Op Note  DG Chest Port 1 View  Result Date: 06/08/2019 CLINICAL DATA:  Acute respiratory failure. EXAM: PORTABLE CHEST 1 VIEW COMPARISON:  June 06, 2019. FINDINGS: Stable cardiomediastinal silhouette. Endotracheal tube is unchanged in position. Interval placement of nasogastric tube which is seen entering stomach. Interval placement of right internal jugular catheter with distal  tip in expected position of cavoatrial junction. Mild central  pulmonary vascular congestion is noted with possible bilateral pulmonary edema. Small left pleural effusion may be present. Bony thorax is unremarkable. IMPRESSION: Interval placement of nasogastric tube and right internal jugular catheter. Mild central pulmonary vascular congestion is noted with possible bilateral pulmonary edema. Electronically Signed   By: Marijo Conception M.D.   On: 06/08/2019 10:28    Cardiac Studies   Pending updated echo.  Echo 04/11/2017 - Left ventricle: The cavity size was normal. There was mild  concentric hypertrophy. Systolic function was normal. The  estimated ejection fraction was in the range of 60% to 65%. Wall  motion was normal; there were no regional wall motion  abnormalities. Doppler parameters are consistent with abnormal  left ventricular relaxation (grade 1 diastolic dysfunction).  - Mitral valve: Calcified annulus. There was mild regurgitation.  - Left atrium: The atrium was mildly dilated.  - Right ventricle: Systolic function was normal.  - Pulmonary arteries: Systolic pressure was within the normal  range.   05/30/2013 NM Myoview No significant wall motion abnormality noted.  Overall, low risk scan.  No significant ischemia.  EF 64%.  LV global function normal.  No EKG changes concerning for ischemia.  Attenuation artifact noted in the study.  Patient Profile     86 y.o. female hx of chronic diastolic heart failure, hypertension, DM2, CKD, and who is being seen today for the evaluation of acute on chronic diastolic heart failure.   Assessment & Plan    Acute on chronic respiratory distress, respiratory failure: Multifactorial : anemia, acute on chronic diastolic heart failure, renal failure -echo with grade 2 diastolic dysfunction -is making clear urine on IV lasix drip at 10 mg/hr, continue -permcath placed yesterday in consideration of future dialysis needs. -for anemia, defer to primary team, but can consider transfusion,  goal hemoglobin 8-10 -remains on ventilator  Hypertension: has remained on carvedilol 12.5 mg BID. No ACEi/ARB/ARNI/MRA given renal failure. Has PRN hydralazine as well.  Hyperlipidemia, CV risk with type II diabetes -on atorvastatin 20 mg, aspirin 81 mg -no documented CAD, MPI in 2015 was normal  Overall difficult situation given comorbidities. Beyond diuresis, there is not much else for cardiology to offer at this time, but we will continue to follow with you.  For questions or updates, please contact [place] Please consult [email or web address] for contact info under     Signed, Buford Dresser, MD  06/08/2019, 2:40 PM

## 2019-06-08 NOTE — Progress Notes (Signed)
CRITICAL CARE PROGRESS NOTE    Name: Lori Stanley MRN: [id number] DOB: 05/04/1931     LOS: 3   SUBJECTIVE FINDINGS & SIGNIFICANT EVENTS   Patient description:  86 yo female admitted to the telemetry unit with acute on chronic hypoxic respiratory failure secondary to acute diastolic CHF exacerbation.  On 02/4 pt respiratory arrested requiring mechanical intubation and transfer to ICU  Lines / Drains: PIV  Cultures / Sepsis markers: COVID negative  Antibiotics: none   Protocols / Consultants: Vascular , Nephrology, PCCM, hospitalist  06/08/19 - patient has been diuresing well, we did ultrasound asessment at bedside and pleural effusion is now small.   PAST MEDICAL HISTORY   Past Medical History:  Diagnosis Date  . Acute heart failure ([place])   . Chronic low back pain   . Diabetes mellitus without complication ([place])   . Hyperlipidemia   . Hypertension   . MI (myocardial infarction) ([place])   . Obesity      SURGICAL HISTORY   Past Surgical History:  Procedure Laterality Date  . EYE SURGERY    . KNEE SURGERY    . THYROID SURGERY    . VAGINAL HYSTERECTOMY       FAMILY HISTORY   Family History  Problem Relation Age of Onset  . Heart attack Mother   . Heart disease Sister   . Cancer Sister      SOCIAL HISTORY   Social History   Tobacco Use  . Smoking status: Former Smoker    Types: Cigarettes  . Smokeless tobacco: Never Used  . Tobacco comment: Quit in 2015-2016  Substance Use Topics  . Alcohol use: No  . Drug use: No     MEDICATIONS   Current Medication:  Current Facility-Administered Medications:  .  0.9 %  sodium chloride infusion, 250 mL, Intravenous, PRN, Mansy, Jan A, MD .  acetaminophen (TYLENOL) suppository 650 mg, 650 mg, Rectal, Q4H PRN, Awilda Bill,  NP .  aspirin chewable tablet 81 mg, 81 mg, Per Tube, Daily, Awilda Bill, NP, 81 mg at 06/08/19 1018 .  atorvastatin (LIPITOR) tablet 20 mg, 20 mg, Per Tube, q1800, Dallie Piles, RPH, 20 mg at 06/07/19 1805 .  bisacodyl (DULCOLAX) suppository 10 mg, 10 mg, Rectal, Daily PRN, Awilda Bill, NP .  brimonidine (ALPHAGAN) 0.2 % ophthalmic solution 1 drop, 1 drop, Both Eyes, BID, Mansy, Jan A, MD, 1 drop at 06/08/19 1020 .  carvedilol (COREG) tablet 12.5 mg, 12.5 mg, Per Tube, BID, Awilda Bill, NP, 12.5 mg at 06/08/19 1018 .  chlorhexidine gluconate (MEDLINE KIT) (PERIDEX) 0.12 % solution 15 mL, 15 mL, Mouth Rinse, BID, Blakeney, Dana G, NP, 15 mL at 06/08/19 0758 .  Chlorhexidine Gluconate Cloth 2 % PADS 6 each, 6 each, Topical, Daily, Awilda Bill, NP, 6 each at 06/08/19 0900 .  dexmedetomidine (PRECEDEX) 400 MCG/100ML (4 mcg/mL) infusion, 0.4-1.2 mcg/kg/hr, Intravenous, Titrated, Blakeney, Dana G, NP, Last Rate: 21.1 mL/hr at 06/08/19 1327, 1.2 mcg/kg/hr at 06/08/19 1327 .  dorzolamide-timolol (COSOPT) 22.3-6.8 MG/ML ophthalmic solution 1 drop, 1 drop, Both Eyes, BID, Mansy, Jan A, MD, 1 drop at 06/08/19 1022 .  epoetin alfa (EPOGEN) injection 10,000 Units, 10,000 Units, Subcutaneous, Weekly, Singh, Harmeet, MD .  famotidine (PEPCID) IVPB 20 mg premix, 20 mg, Intravenous, Q24H, Awilda Bill, NP, Stopped at 06/07/19 2201 .  fentaNYL 2565mg in NS 2570m(1074mml) infusion-PREMIX, 25-200 mcg/hr, Intravenous, Continuous, Blakeney, DanDreama SaaP, Last Rate: 15 mL/hr at  06/08/19 1300, 150 mcg/hr at 06/08/19 1300 .  furosemide (LASIX) 250 mg in dextrose 5 % 250 mL (1 mg/mL) infusion, 10 mg/hr, Intravenous, Continuous, Ouma, Bing Neighbors, NP, Last Rate: 10 mL/hr at 06/08/19 0632, 10 mg/hr at 06/08/19 4665 .  hydrALAZINE (APRESOLINE) injection 10-20 mg, 10-20 mg, Intravenous, Q4H PRN, Awilda Bill, NP, 20 mg at 06/07/19 2043 .  insulin aspart (novoLOG) injection 0-9 Units, 0-9  Units, Subcutaneous, Q4H, Awilda Bill, NP, 1 Units at 06/07/19 2347 .  ipratropium-albuterol (DUONEB) 0.5-2.5 (3) MG/3ML nebulizer solution 3 mL, 3 mL, Nebulization, QID, Enzo Bi, MD, 3 mL at 06/08/19 1132 .  latanoprost (XALATAN) 0.005 % ophthalmic solution 1 drop, 1 drop, Left Eye, QHS, Mansy, Jan A, MD, 1 drop at 06/07/19 2133 .  MEDLINE mouth rinse, 15 mL, Mouth Rinse, 10 times per day, Awilda Bill, NP, 15 mL at 06/08/19 1202 .  olopatadine (PATANOL) 0.1 % ophthalmic solution 1 drop, 1 drop, Both Eyes, BID, Mansy, Jan A, MD, 1 drop at 06/08/19 1021 .  ondansetron (ZOFRAN) injection 4 mg, 4 mg, Intravenous, Q6H PRN, Mansy, Jan A, MD .  sertraline (ZOLOFT) tablet 25 mg, 25 mg, Per Tube, Daily, Awilda Bill, NP, 25 mg at 06/08/19 1019 .  sodium chloride flush (NS) 0.9 % injection 3 mL, 3 mL, Intravenous, Q12H, Mansy, Jan A, MD, 3 mL at 06/07/19 0909 .  sodium chloride flush (NS) 0.9 % injection 3 mL, 3 mL, Intravenous, PRN, Mansy, Jan A, MD .  vitamin B-12 (CYANOCOBALAMIN) tablet 1,000 mcg, 1,000 mcg, Per Tube, Daily, Awilda Bill, NP, 1,000 mcg at 06/08/19 1019    ALLERGIES   Antihistamines, chlorpheniramine-type; Codeine; and Paxil [paroxetine]    REVIEW OF SYSTEMS     Unable to obtain due to mechanical ventilation  PHYSICAL EXAMINATION   Vital Signs: Temp:  [95 F (35 C)-98.5 F (36.9 C)] 95 F (35 C) (02/06 1200) Pulse Rate:  [51-77] 68 (02/06 1200) Resp:  [20-22] 20 (02/06 0800) BP: (120-180)/(53-77) 149/74 (02/06 1200) SpO2:  [91 %-100 %] 96 % (02/06 1200) FiO2 (%):  [28 %-30 %] 28 % (02/06 1132) Weight:  [67.3 kg] 67.3 kg (02/06 0346)  GENERAL: Chronically ill-appearing HEAD: Normocephalic, atraumatic.  EYES: Pupils equal, round, reactive to light.  No scleral icterus.  MOUTH: Moist mucosal membrane. NECK: Supple. No thyromegaly. No nodules. No JVD.  PULMONARY: Bibasilar crackles without wheezing CARDIOVASCULAR: S1 and S2. Regular rate and  rhythm. No murmurs, rubs, or gallops.  GASTROINTESTINAL: Soft, nontender, non-distended. No masses. Positive bowel sounds. No hepatosplenomegaly.  MUSCULOSKELETAL: No swelling, clubbing, or edema.  NEUROLOGIC: Intubated on mechanical ventilation with GCS of 4 SKIN:intact,warm,dry   PERTINENT DATA     Infusions: . sodium chloride    . dexmedetomidine (PRECEDEX) IV infusion 1.2 mcg/kg/hr (06/08/19 1327)  . famotidine (PEPCID) IV Stopped (06/07/19 2201)  . fentaNYL infusion INTRAVENOUS 150 mcg/hr (06/08/19 1300)  . furosemide (LASIX) infusion 10 mg/hr (06/08/19 9935)   Scheduled Medications: . aspirin  81 mg Per Tube Daily  . atorvastatin  20 mg Per Tube q1800  . brimonidine  1 drop Both Eyes BID  . carvedilol  12.5 mg Per Tube BID  . chlorhexidine gluconate (MEDLINE KIT)  15 mL Mouth Rinse BID  . Chlorhexidine Gluconate Cloth  6 each Topical Daily  . dorzolamide-timolol  1 drop Both Eyes BID  . epoetin (EPOGEN/PROCRIT) injection  10,000 Units Subcutaneous Weekly  . insulin aspart  0-9 Units Subcutaneous Q4H  . ipratropium-albuterol  3 mL Nebulization QID  . latanoprost  1 drop Left Eye QHS  . mouth rinse  15 mL Mouth Rinse 10 times per day  . olopatadine  1 drop Both Eyes BID  . sertraline  25 mg Per Tube Daily  . sodium chloride flush  3 mL Intravenous Q12H  . vitamin B-12  1,000 mcg Per Tube Daily   PRN Medications: sodium chloride, acetaminophen, bisacodyl, hydrALAZINE, ondansetron (ZOFRAN) IV, sodium chloride flush Hemodynamic parameters:   Intake/Output: 02/05 0701 - 02/06 0700 In: 717.3 [I.V.:667.3; IV Piggyback:50] Out: 6979 [Urine:2210]  Ventilator  Settings: Vent Mode: PRVC FiO2 (%):  [28 %-30 %] 28 % Set Rate:  [20 bmp] 20 bmp Vt Set:  [500 mL] 500 mL PEEP:  [5 cmH20] 5 cmH20    LAB RESULTS:  Basic Metabolic Panel: Recent Labs  Lab 06/06/19 0621 06/06/19 4801 06/06/19 0913 06/06/19 2023 06/06/19 2023 06/07/19 0612 06/07/19 0612 06/07/19 0629  06/08/19 0326  NA 137  --   --  137  --  139  --  138 139  K 4.6   < >  --  4.7   < > 4.2   < > 4.2 3.7  CL 104  --   --  103  --  103  --  103 104  CO2 27  --   --  25  --  25  --  26 24  GLUCOSE 96  --   --  147*  --  111*  --  109* 113*  BUN 66*  --   --  65*  --  63*  --  66* 65*  CREATININE 3.95*  --   --  3.74*  --  3.81*  --  3.95* 3.75*  CALCIUM 9.2  --   --  9.6  --  9.5  --  9.4 9.3  MG  --   --  2.5*  --   --   --   --  2.6* 2.4  PHOS  --   --   --   --   --  4.3  --   --   --    < > = values in this interval not displayed.   Liver Function Tests: Recent Labs  Lab 06/05/19 0102 06/06/19 2023 06/07/19 0612  AST 26 26  --   ALT 19 23  --   ALKPHOS 69 68  --   BILITOT 0.6 0.6  --   PROT 6.7 6.3*  --   ALBUMIN 3.6 3.5 3.3*   No results for input(s): LIPASE, AMYLASE in the last 168 hours. No results for input(s): AMMONIA in the last 168 hours. CBC: Recent Labs  Lab 06/05/19 0647 06/06/19 0913 06/06/19 2023 06/07/19 0629 06/08/19 0326  WBC 5.3 5.5 6.0 6.5 6.9  NEUTROABS 4.3  --  5.2  --   --   HGB 7.1* 7.5* 8.6* 8.0* 8.8*  HCT 23.6* 25.4* 29.0* 25.5* 26.6*  MCV 88.7 89.4 88.4 86.4 82.4  PLT 210 246 267 243 263   Cardiac Enzymes: No results for input(s): CKTOTAL, CKMB, CKMBINDEX, TROPONINI in the last 168 hours. BNP: Invalid input(s): POCBNP CBG: Recent Labs  Lab 06/07/19 1943 06/07/19 2341 06/08/19 0325 06/08/19 0735 06/08/19 1136  GLUCAP 116* 125* 110* 125* 140*     IMAGING RESULTS:  Imaging: DG Chest 1 View  Result Date: 06/06/2019 CLINICAL DATA:  86 year old female status post intubation. EXAM: CHEST  1 VIEW COMPARISON:  Chest radiograph dated 06/05/2019. FINDINGS:  Endotracheal tube with tip approximately 2.5 cm above the carina. There is vascular congestion and edema as well as left pleural effusion, similar or slightly worsened since the prior radiograph. Pneumonia is not excluded. Clinical correlation is recommended. No pneumothorax. Stable  cardiac silhouette. Atherosclerotic calcification of the aorta. No acute osseous pathology. IMPRESSION: 1. Interval placement of an endotracheal tube with tip above the carina. 2. CHF and left effusion similar or slightly worsened. Pneumonia is not excluded. Clinical correlation is recommended. Electronically Signed   By: Anner Crete M.D.   On: 06/06/2019 20:25   DG Abd 1 View  Result Date: 06/06/2019 CLINICAL DATA:  OG tube placement EXAM: ABDOMEN - 1 VIEW COMPARISON:  None. FINDINGS: The OG tube projects over the gastric body. The bowel gas pattern is nonobstructive. Body wall edema is noted. IMPRESSION: OG tube projects over the stomach. Electronically Signed   By: Constance Holster M.D.   On: 06/06/2019 21:51   PERIPHERAL VASCULAR CATHETERIZATION  Result Date: 06/07/2019 See Op Note  DG Chest Port 1 View  Result Date: 06/08/2019 CLINICAL DATA:  Acute respiratory failure. EXAM: PORTABLE CHEST 1 VIEW COMPARISON:  June 06, 2019. FINDINGS: Stable cardiomediastinal silhouette. Endotracheal tube is unchanged in position. Interval placement of nasogastric tube which is seen entering stomach. Interval placement of right internal jugular catheter with distal tip in expected position of cavoatrial junction. Mild central pulmonary vascular congestion is noted with possible bilateral pulmonary edema. Small left pleural effusion may be present. Bony thorax is unremarkable. IMPRESSION: Interval placement of nasogastric tube and right internal jugular catheter. Mild central pulmonary vascular congestion is noted with possible bilateral pulmonary edema. Electronically Signed   By: Marijo Conception M.D.   On: 06/08/2019 10:28      ASSESSMENT AND PLAN    -Multidisciplinary rounds held today  Severe acute Hypoxic Respiratory Failure -Due to acute on chronic decompensated diastolic CHF -Currently on mechanical ventilation post respiratory etiology of cardiac arrest -Lasix GTT -Strict I's and O's with  Foley catheter -continue Full MV support -continue Bronchodilator Therapy -Wean Fio2 and PEEP as tolerated -will perform SAT/SBT when respiratory parameters are met Net -3.5L -[place] at bedside with minimal pleural effusion  Status post cardiac arrest likely due to respiratory failure with hypoxemia -Cardiology on case appreciate input -Lasix drip currently ICU telemetry monitoring  Renal Failure acute on chronic stage V with proteinuria Nephrology on case-appreciate input -Possible EPO and HD -Plan for subclavian port insertion with vascular surgery -follow chem 7 -follow UO -continue Foley Catheter-assess need daily    ID -continue IV abx as prescibed -follow up cultures  GI/Nutrition GI PROPHYLAXIS as indicated DIET-->TF's as tolerated Constipation protocol as indicated  ENDO - ICU hypoglycemic\Hyperglycemia protocol -check FSBS per protocol   ELECTROLYTES -follow labs as needed -replace as needed -pharmacy consultation   DVT/GI PRX ordered -SCDs  TRANSFUSIONS AS NEEDED MONITOR FSBS ASSESS the need for LABS as needed   Critical care provider statement:    Critical care time (minutes):  33   Critical care time was exclusive of:  Separately billable procedures and treating other patients   Critical care was necessary to treat or prevent imminent or life-threatening deterioration of the following conditions:   Severe acute hypoxemic respiratory failure, status post cardiac arrest, acute on chronic renal failure, essential hypertension, diabetes mellitus, anemia, hyperparathyroidism, multiple comorbid conditions   Critical care was time spent personally by me on the following activities:  Development of treatment plan with patient or surrogate, discussions with  consultants, evaluation of patient's response to treatment, examination of patient, obtaining history from patient or surrogate, ordering and performing treatments and interventions, ordering and review of  laboratory studies and re-evaluation of patient's condition.  I assumed direction of critical care for this patient from another provider in my specialty: no    This document was prepared using Dragon voice recognition software and may include unintentional dictation errors.    Ottie Glazier, M.D.  Division of [facility]

## 2019-06-08 NOTE — Progress Notes (Signed)
[place], [place] 06/08/19  Subjective:   Hospital day # 3 Patient remains critically ill, intubated sedated  cvs: Currently on furosemide drip pulm: Ventilator dependent, FiO2 30% gi: OG tube present Renal: Foley in place.  Good urine output 02/05 0701 - 02/06 0700 In: 717.3 [I.V.:667.3; IV Piggyback:50] Out: 2210 [Urine:2210] Lab Results  Component Value Date   CREATININE 3.75 (H) 06/08/2019   CREATININE 3.95 (H) 06/07/2019   CREATININE 3.81 (H) 06/07/2019     Objective:  Vital signs in last 24 hours:  Temp:  [97.5 F (36.4 C)-98.5 F (36.9 C)] 97.5 F (36.4 C) (02/06 0400) Pulse Rate:  [51-77] 58 (02/06 0900) Resp:  [20-22] 20 (02/06 0800) BP: (120-180)/(53-77) 125/66 (02/06 0900) SpO2:  [91 %-100 %] 100 % (02/06 0900) FiO2 (%):  [30 %-40 %] 30 % (02/06 0745) Weight:  [67.3 kg] 67.3 kg (02/06 0346)  Weight change:  Filed Weights   06/06/19 0418 06/08/19 0346  Weight: 70.4 kg 67.3 kg    Intake/Output:    Intake/Output Summary (Last 24 hours) at 06/08/2019 0908 Last data filed at 06/08/2019 0750 Gross per 24 hour  Intake 603.35 ml  Output 2810 ml  Net -2206.65 ml     Physical Exam: General:  Critically ill-appearing, laying in the bed  HEENT  ET tube, OG tube  Pulm/lungs  ventilator assisted  CVS/Heart  regular, no rub  Abdomen:   Soft  Extremities:  Peripheral edema present  Neurologic:  Sedated  Skin:  Warm  Access:  Right IJ PermCath       Basic Metabolic Panel:  Recent Labs  Lab 06/06/19 0621 06/06/19 1572 06/06/19 0913 06/06/19 2023 06/06/19 2023 06/07/19 0612 06/07/19 0629 06/08/19 0326  NA 137  --   --  137  --  139 138 139  K 4.6  --   --  4.7  --  4.2 4.2 3.7  CL 104  --   --  103  --  103 103 104  CO2 27  --   --  25  --  25 26 24   GLUCOSE 96  --   --  147*  --  111* 109* 113*  BUN 66*  --   --  65*  --  63* 66* 65*  CREATININE 3.95*  --   --  3.74*  --  3.81* 3.95* 3.75*  CALCIUM 9.2   < >  --   9.6   < > 9.5 9.4 9.3  MG  --   --  2.5*  --   --   --  2.6* 2.4  PHOS  --   --   --   --   --  4.3  --   --    < > = values in this interval not displayed.     CBC: Recent Labs  Lab 06/05/19 0647 06/06/19 0913 06/06/19 2023 06/07/19 0629 06/08/19 0326  WBC 5.3 5.5 6.0 6.5 6.9  NEUTROABS 4.3  --  5.2  --   --   HGB 7.1* 7.5* 8.6* 8.0* 8.8*  HCT 23.6* 25.4* 29.0* 25.5* 26.6*  MCV 88.7 89.4 88.4 86.4 82.4  PLT 210 246 267 243 263      Lab Results  Component Value Date   HEPBSAG NON REACTIVE 06/07/2019   HEPBSAB NON REACTIVE 06/07/2019   HEPBIGM NON REACTIVE 06/07/2019      Microbiology:  Recent Results (from the past 240 hour(s))  Respiratory Panel by RT PCR (Flu A&B,  Covid) - Nasopharyngeal Swab     Status: None   Collection Time: 06/05/19  2:27 AM   Specimen: Nasopharyngeal Swab  Result Value Ref Range Status   SARS Coronavirus 2 by RT PCR NEGATIVE NEGATIVE Final    Comment: (NOTE) SARS-CoV-2 target nucleic acids are NOT DETECTED. The SARS-CoV-2 RNA is generally detectable in upper respiratoy specimens during the acute phase of infection. The lowest concentration of SARS-CoV-2 viral copies this assay can detect is 131 copies/mL. A negative result does not preclude SARS-Cov-2 infection and should not be used as the sole basis for treatment or other patient management decisions. A negative result may occur with  improper specimen collection/handling, submission of specimen other than nasopharyngeal swab, presence of viral mutation(s) within the areas targeted by this assay, and inadequate number of viral copies (<131 copies/mL). A negative result must be combined with clinical observations, patient history, and epidemiological information. The expected result is Negative. Fact Sheet for Patients:  PinkCheek.be Fact Sheet for Healthcare Providers:  GravelBags.it This test is not yet ap proved or cleared  by the [place] FDA and  has been authorized for detection and/or diagnosis of SARS-CoV-2 by FDA under an Emergency Use Authorization (EUA). This EUA will remain  in effect (meaning this test can be used) for the duration of the COVID-19 declaration under Section 564(b)(1) of the Act, 21 U.S.C. section 360bbb-3(b)(1), unless the authorization is terminated or revoked sooner.    Influenza A by PCR NEGATIVE NEGATIVE Final   Influenza B by PCR NEGATIVE NEGATIVE Final    Comment: (NOTE) The Xpert Xpress SARS-CoV-2/FLU/RSV assay is intended as an aid in  the diagnosis of influenza from Nasopharyngeal swab specimens and  should not be used as a sole basis for treatment. Nasal washings and  aspirates are unacceptable for Xpert Xpress SARS-CoV-2/FLU/RSV  testing. Fact Sheet for Patients: PinkCheek.be Fact Sheet for Healthcare Providers: GravelBags.it This test is not yet approved or cleared by the [place] FDA and  has been authorized for detection and/or diagnosis of SARS-CoV-2 by  FDA under an Emergency Use Authorization (EUA). This EUA will remain  in effect (meaning this test can be used) for the duration of the  Covid-19 declaration under Section 564(b)(1) of the Act, 21  U.S.C. section 360bbb-3(b)(1), unless the authorization is  terminated or revoked. Performed at [facility], [address] [place], [place] [address]   MRSA PCR Screening     Status: None   Collection Time: 06/07/19 12:53 AM   Specimen: Nasopharyngeal  Result Value Ref Range Status   MRSA by PCR NEGATIVE NEGATIVE Final    Comment:        The GeneXpert MRSA Assay (FDA approved for NASAL specimens only), is one component of a comprehensive MRSA colonization surveillance program. It is not intended to diagnose MRSA infection nor to guide or monitor treatment for MRSA infections. Performed at [facility], [address] [place], [place] [address]     Coagulation Studies: Recent Labs    06/07/19 1105  LABPROT 14.9  INR 1.2    Urinalysis: No results for input(s): COLORURINE, LABSPEC, PHURINE, GLUCOSEU, HGBUR, BILIRUBINUR, KETONESUR, PROTEINUR, UROBILINOGEN, NITRITE, LEUKOCYTESUR in the last 72 hours.  Invalid input(s): APPERANCEUR    Imaging: DG Chest 1 View  Result Date: 06/06/2019 CLINICAL DATA:  86 year old female status post intubation. EXAM: CHEST  1 VIEW COMPARISON:  Chest radiograph dated 06/05/2019. FINDINGS: Endotracheal tube with tip approximately 2.5 cm above the carina. There is vascular  congestion and edema as well as left pleural effusion, similar or slightly worsened since the prior radiograph. Pneumonia is not excluded. Clinical correlation is recommended. No pneumothorax. Stable cardiac silhouette. Atherosclerotic calcification of the aorta. No acute osseous pathology. IMPRESSION: 1. Interval placement of an endotracheal tube with tip above the carina. 2. CHF and left effusion similar or slightly worsened. Pneumonia is not excluded. Clinical correlation is recommended. Electronically Signed   By: Anner Crete M.D.   On: 06/06/2019 20:25   DG Abd 1 View  Result Date: 06/06/2019 CLINICAL DATA:  OG tube placement EXAM: ABDOMEN - 1 VIEW COMPARISON:  None. FINDINGS: The OG tube projects over the gastric body. The bowel gas pattern is nonobstructive. Body wall edema is noted. IMPRESSION: OG tube projects over the stomach. Electronically Signed   By: Constance Holster M.D.   On: 06/06/2019 21:51   PERIPHERAL VASCULAR CATHETERIZATION  Result Date: 06/07/2019 See Op Note    Medications:   . sodium chloride    . dexmedetomidine (PRECEDEX) IV infusion 0.6 mcg/kg/hr (06/08/19 0932)  . famotidine (PEPCID) IV Stopped (06/07/19 2201)  . fentaNYL infusion INTRAVENOUS 200 mcg/hr (06/07/19 1705)  . furosemide (LASIX) infusion 10 mg/hr (06/08/19 6712)   . aspirin  81 mg Per Tube Daily  .  atorvastatin  20 mg Per Tube q1800  . brimonidine  1 drop Both Eyes BID  . carvedilol  12.5 mg Per Tube BID  . chlorhexidine gluconate (MEDLINE KIT)  15 mL Mouth Rinse BID  . Chlorhexidine Gluconate Cloth  6 each Topical Daily  . dorzolamide-timolol  1 drop Both Eyes BID  . heparin injection (subcutaneous)  5,000 Units Subcutaneous Q8H  . insulin aspart  0-9 Units Subcutaneous Q4H  . ipratropium-albuterol  3 mL Nebulization QID  . latanoprost  1 drop Left Eye QHS  . mouth rinse  15 mL Mouth Rinse 10 times per day  . olopatadine  1 drop Both Eyes BID  . sertraline  25 mg Per Tube Daily  . sodium chloride flush  3 mL Intravenous Q12H  . vitamin B-12  1,000 mcg Per Tube Daily   sodium chloride, acetaminophen, bisacodyl, hydrALAZINE, ondansetron (ZOFRAN) IV, sodium chloride flush  Assessment/ Plan:  86 y.o.African [place] female with diabetes mellitus type II, hypertension, anemia, GERD, depression, anxiety, glaucoma, hyperlipidemia, diastolic congestive heart failure, coronary artery disease admitted on 06/05/2019 for SOB (shortness of breath) [R06.02] Acute CHF (congestive heart failure) ([place]) [I50.9] Acute respiratory failure with hypoxia (HCC) [J96.01] Heart failure (HCC) [I50.9] Acute on chronic congestive heart failure, unspecified heart failure type ([place]) [I50.9]   1. ARF on CKD st 5  Chronic kidney disease is likely secondary to diabetic nephropathy and hypertensive nephrosclerosis.  This admission, patient presented via EMS from home for respiratory distress, shortness of breath, worsening lower extremity edema.  She has failed outpatient volume management. Dialysis catheter was placed yesterday Overall patient is feeling condition has improved.  We will defer dialysis for now till next week.  2.  Acute respiratory failure Ventilator assisted Hospital course complicated by respiratory arrest the night of February 4  Currently on furosemide infusion, responding well with good  urine output Volume status appears to have optimized  3. Diabetes type 2 with CKD Lab Results  Component Value Date   HGBA1C 6.0 (H) 04/10/2019   4. Anemia of CKD -  Lab Results  Component Value Date   HGB 8.8 (L) 06/08/2019  will start EPO SQ  5. [facility] Lab Results  Component Value  Date   PTH 148 (H) 06/07/2019   CALCIUM 9.3 06/08/2019   PHOS 4.3 06/07/2019        LOS: [place] 2/6/20219:08 AM  [place], [place]  Note: This note was prepared with Dragon dictation. Any transcription errors are unintentional

## 2019-06-09 ENCOUNTER — Inpatient Hospital Stay: Payer: Medicare Other

## 2019-06-09 LAB — BASIC METABOLIC PANEL
Anion gap: 12 (ref 5–15)
BUN: 66 mg/dL — ABNORMAL HIGH (ref 8–23)
CO2: 22 mmol/L (ref 22–32)
Calcium: 9.1 mg/dL (ref 8.9–10.3)
Chloride: 105 mmol/L (ref 98–111)
Creatinine, Ser: 3.88 mg/dL — ABNORMAL HIGH (ref 0.44–1.00)
GFR calc Af Amer: 12 mL/min — ABNORMAL LOW (ref >60)
GFR calc non Af Amer: 10 mL/min — ABNORMAL LOW (ref >60)
Glucose, Bld: 116 mg/dL — ABNORMAL HIGH (ref 70–99)
Potassium: 3.3 mmol/L — ABNORMAL LOW (ref 3.5–5.1)
Sodium: 139 mmol/L (ref 135–145)

## 2019-06-09 LAB — GLUCOSE, CAPILLARY
Glucose-Capillary: 112 mg/dL — ABNORMAL HIGH (ref 70–99)
Glucose-Capillary: 120 mg/dL — ABNORMAL HIGH (ref 70–99)
Glucose-Capillary: 125 mg/dL — ABNORMAL HIGH (ref 70–99)
Glucose-Capillary: 135 mg/dL — ABNORMAL HIGH (ref 70–99)
Glucose-Capillary: 143 mg/dL — ABNORMAL HIGH (ref 70–99)

## 2019-06-09 LAB — CBC
HCT: 26.9 % — ABNORMAL LOW (ref 36.0–46.0)
Hemoglobin: 8.9 g/dL — ABNORMAL LOW (ref 12.0–15.0)
MCH: 26.8 pg (ref 26.0–34.0)
MCHC: 33.1 g/dL (ref 30.0–36.0)
MCV: 81 fL (ref 80.0–100.0)
Platelets: 250 10*3/uL (ref 150–400)
RBC: 3.32 MIL/uL — ABNORMAL LOW (ref 3.87–5.11)
RDW: 14.5 % (ref 11.5–15.5)
WBC: 9.1 10*3/uL (ref 4.0–10.5)
nRBC: 0 % (ref 0.0–0.2)

## 2019-06-09 LAB — PROCALCITONIN: Procalcitonin: 0.67 ng/mL

## 2019-06-09 MED ORDER — DEXTROSE 5 % IV SOLN
0.5000 g | INTRAVENOUS | Status: DC
  Administered 2019-06-09 – 2019-06-13 (×5): 0.5 g via INTRAVENOUS
  Filled 2019-06-09 (×8): qty 0.5

## 2019-06-09 MED ORDER — POTASSIUM CHLORIDE 20 MEQ PO PACK
40.0000 meq | PACK | Freq: Once | ORAL | Status: AC
  Administered 2019-06-09: 40 meq
  Filled 2019-06-09: qty 2

## 2019-06-09 MED ORDER — MIDAZOLAM HCL 2 MG/2ML IJ SOLN
INTRAMUSCULAR | Status: AC
  Filled 2019-06-09: qty 2

## 2019-06-09 MED ORDER — POTASSIUM CHLORIDE 20 MEQ PO PACK
20.0000 meq | PACK | Freq: Once | ORAL | Status: AC
  Administered 2019-06-09: 20 meq via ORAL
  Filled 2019-06-09: qty 1

## 2019-06-09 MED ORDER — SODIUM CHLORIDE 0.9 % IV SOLN
100.0000 mg | Freq: Two times a day (BID) | INTRAVENOUS | Status: DC
  Administered 2019-06-09 (×2): 100 mg via INTRAVENOUS
  Filled 2019-06-09 (×4): qty 100

## 2019-06-09 MED ORDER — SODIUM CHLORIDE 0.9 % IV SOLN: 1.0000 g | Freq: Three times a day (TID) | INTRAVENOUS | Status: DC

## 2019-06-09 MED ORDER — DOCUSATE SODIUM 50 MG/5ML PO LIQD
100.0000 mg | Freq: Two times a day (BID) | ORAL | Status: DC
  Administered 2019-06-09 – 2019-06-10 (×2): 100 mg
  Filled 2019-06-09 (×2): qty 10

## 2019-06-09 MED ORDER — MIDAZOLAM HCL 2 MG/2ML IJ SOLN
2.0000 mg | Freq: Once | INTRAMUSCULAR | Status: AC
  Administered 2019-06-09: 2 mg via INTRAVENOUS

## 2019-06-09 NOTE — Progress Notes (Signed)
Progress Note  Patient Name: Lori Stanley Date of Encounter: 06/09/2019  Primary Cardiologist:  Dr. Fletcher Anon  Subjective   Remains intubated and sedated. Thicker respiratory secretions today. She has eyes open and does nod to questions. No chest pain based on response.  Inpatient Medications    Scheduled Meds: . aspirin  81 mg Per Tube Daily  . atorvastatin  20 mg Per Tube q1800  . brimonidine  1 drop Both Eyes BID  . carvedilol  12.5 mg Per Tube BID  . chlorhexidine gluconate (MEDLINE KIT)  15 mL Mouth Rinse BID  . Chlorhexidine Gluconate Cloth  6 each Topical Daily  . docusate  100 mg Per Tube BID  . dorzolamide-timolol  1 drop Both Eyes BID  . epoetin (EPOGEN/PROCRIT) injection  10,000 Units Subcutaneous Weekly  . insulin aspart  0-9 Units Subcutaneous Q4H  . ipratropium-albuterol  3 mL Nebulization QID  . latanoprost  1 drop Left Eye QHS  . mouth rinse  15 mL Mouth Rinse 10 times per day  . olopatadine  1 drop Both Eyes BID  . potassium chloride  20 mEq Oral Once  . sertraline  25 mg Per Tube Daily  . sodium chloride flush  3 mL Intravenous Q12H  . vitamin B-12  1,000 mcg Per Tube Daily   Continuous Infusions: . sodium chloride    . cefTAZidime (FORTAZ)  IV Stopped (06/09/19 1132)  . dexmedetomidine (PRECEDEX) IV infusion Stopped (06/09/19 0940)  . doxycycline (VIBRAMYCIN) IV 100 mg (06/09/19 1134)  . famotidine (PEPCID) IV Stopped (06/08/19 2202)  . fentaNYL infusion INTRAVENOUS 100 mcg/hr (06/09/19 1348)  . furosemide (LASIX) infusion 10 mg/hr (06/09/19 0603)   PRN Meds: sodium chloride, acetaminophen, bisacodyl, hydrALAZINE, ondansetron (ZOFRAN) IV, sodium chloride flush   Vital Signs    Vitals:   06/09/19 1000 06/09/19 1100 06/09/19 1130 06/09/19 1200  BP: 98/67 (!) 142/50  128/62  Pulse: 87 84  81  Resp:    (!) 35  Temp:    99.8 F (37.7 C)  TempSrc:    Axillary  SpO2: 95% 98% 94% 95%  Weight:      Height:        Intake/Output Summary (Last 24  hours) at 06/09/2019 1451 Last data filed at 06/09/2019 0940 Gross per 24 hour  Intake 982.98 ml  Output 2300 ml  Net -1317.02 ml   Last 3 Weights 06/09/2019 06/08/2019 06/06/2019  Weight (lbs) 151 lb 3.8 oz 148 lb 5.9 oz 155 lb 4.8 oz  Weight (kg) 68.6 kg 67.3 kg 70.444 kg      Telemetry    Predominantly sinus rhythm, occasional sinus tachycardia, occasional PVCs-personally Reviewed  ECG    No new tracings since 2/3- Personally Reviewed  Physical Exam   GEN: intubated, but eyes open, nods to questions today HEENT: ET tube in place NECK: No JVD visualized CARDIAC: regular rhythm, normal S1 and S2, no rubs or gallops. 1/6 systolic ejection murmur. VASCULAR: Radial pulses 2+ bilaterally. RESPIRATORY:  Coarse breath sounds, mechanical ventilation ABDOMEN: Soft, non-tender, non-distended MUSCULOSKELETAL:  Ambulates independently SKIN: Warm and dry, bilateral 1+ LE edema NEUROLOGIC:  Intubated, but eyes open, nods to questions, follows commands  Labs    High Sensitivity Troponin:   Recent Labs  Lab 06/05/19 0102 06/05/19 0227 06/06/19 0621 06/06/19 2023 06/06/19 2145  TROPONINIHS 3 4 3 3 4       Cardiac EnzymesNo results for input(s): TROPONINI in the last 168 hours. No results for input(s): TROPIPOC in the  last 168 hours.   Chemistry Recent Labs  Lab 06/05/19 0102 06/06/19 7517 06/06/19 2023 06/06/19 2023 06/07/19 0612 06/07/19 0612 06/07/19 0629 06/08/19 0326 06/09/19 0448  NA 136   < > 137   < > 139   < > 138 139 139  K 4.7   < > 4.7   < > 4.2   < > 4.2 3.7 3.3*  CL 102   < > 103   < > 103   < > 103 104 105  CO2 25   < > 25   < > 25   < > 26 24 22   GLUCOSE 150*   < > 147*   < > 111*   < > 109* 113* 116*  BUN 61*   < > 65*   < > 63*   < > 66* 65* 66*  CREATININE 3.86*   < > 3.74*   < > 3.81*   < > 3.95* 3.75* 3.88*  CALCIUM 9.2   < > 9.6   < > 9.5   < > 9.4 9.3 9.1  PROT 6.7  --  6.3*  --   --   --   --   --   --   ALBUMIN 3.6  --  3.5  --  3.3*  --   --   --    --   AST 26  --  26  --   --   --   --   --   --   ALT 19  --  23  --   --   --   --   --   --   ALKPHOS 69  --  68  --   --   --   --   --   --   BILITOT 0.6  --  0.6  --   --   --   --   --   --   GFRNONAA 10*   < > 10*   < > 10*   < > 10* 10* 10*  GFRAA 12*   < > 12*   < > 12*   < > 11* 12* 12*  ANIONGAP 9   < > 9   < > 11   < > 9 11 12    < > = values in this interval not displayed.     Hematology Recent Labs  Lab 06/07/19 0629 06/08/19 0326 06/09/19 0448  WBC 6.5 6.9 9.1  RBC 2.95* 3.23* 3.32*  HGB 8.0* 8.8* 8.9*  HCT 25.5* 26.6* 26.9*  MCV 86.4 82.4 81.0  MCH 27.1 27.2 26.8  MCHC 31.4 33.1 33.1  RDW 14.4 14.5 14.5  PLT 243 263 250    BNP Recent Labs  Lab 06/05/19 0102 06/06/19 2023  BNP 577.0* 549.0*     DDimer No results for input(s): DDIMER in the last 168 hours.   Radiology    PERIPHERAL VASCULAR CATHETERIZATION  Result Date: 06/07/2019 See Op Note  DG Chest Port 1 View  Result Date: 06/09/2019 CLINICAL DATA:  Pulmonary disease. EXAM: PORTABLE CHEST 1 VIEW COMPARISON:  June 07, 2019. FINDINGS: Stable cardiomediastinal silhouette. Endotracheal and nasogastric tubes are unchanged in position. Right internal jugular catheter is unchanged. No pneumothorax is noted. Mild central pulmonary vascular congestion is noted. Increased bibasilar atelectasis or edema is noted with small pleural effusions. Bony thorax is unremarkable. IMPRESSION: Stable support apparatus. Increased bibasilar atelectasis or edema is noted with small pleural effusions. Electronically  Signed   By: Marijo Conception M.D.   On: 06/09/2019 10:47   DG Chest Port 1 View  Result Date: 06/08/2019 CLINICAL DATA:  Acute respiratory failure. EXAM: PORTABLE CHEST 1 VIEW COMPARISON:  June 06, 2019. FINDINGS: Stable cardiomediastinal silhouette. Endotracheal tube is unchanged in position. Interval placement of nasogastric tube which is seen entering stomach. Interval placement of right internal jugular  catheter with distal tip in expected position of cavoatrial junction. Mild central pulmonary vascular congestion is noted with possible bilateral pulmonary edema. Small left pleural effusion may be present. Bony thorax is unremarkable. IMPRESSION: Interval placement of nasogastric tube and right internal jugular catheter. Mild central pulmonary vascular congestion is noted with possible bilateral pulmonary edema. Electronically Signed   By: Marijo Conception M.D.   On: 06/08/2019 10:28    Cardiac Studies   Pending updated echo.  Echo 04/11/2017 - Left ventricle: The cavity size was normal. There was mild  concentric hypertrophy. Systolic function was normal. The  estimated ejection fraction was in the range of 60% to 65%. Wall  motion was normal; there were no regional wall motion  abnormalities. Doppler parameters are consistent with abnormal  left ventricular relaxation (grade 1 diastolic dysfunction).  - Mitral valve: Calcified annulus. There was mild regurgitation.  - Left atrium: The atrium was mildly dilated.  - Right ventricle: Systolic function was normal.  - Pulmonary arteries: Systolic pressure was within the normal  range.   05/30/2013 NM Myoview No significant wall motion abnormality noted.  Overall, low risk scan.  No significant ischemia.  EF 64%.  LV global function normal.  No EKG changes concerning for ischemia.  Attenuation artifact noted in the study.  Patient Profile     86 y.o. female hx of chronic diastolic heart failure, hypertension, DM2, CKD, and who is being seen today for the evaluation of acute on chronic diastolic heart failure.   Assessment & Plan    Acute on chronic respiratory distress, respiratory failure: Multifactorial : anemia, acute on chronic diastolic heart failure, renal failure -echo with grade 2 diastolic dysfunction -is making clear urine on IV lasix drip at 10 mg/hr, continue -permcath placed yesterday in consideration of future  dialysis needs. -for anemia, defer to primary team, but can consider transfusion, goal hemoglobin 8-10 -remains on ventilator. Thick secretions today, sent for further evaluation  Hypertension: has remained on carvedilol 12.5 mg BID. No ACEi/ARB/ARNI/MRA given renal failure. Has PRN hydralazine as well.  Hyperlipidemia, CV risk with type II diabetes -on atorvastatin 20 mg, aspirin 81 mg -no documented CAD, MPI in 2015 was normal  No changes from a cardiovascular perspective. She remains critically ill and intubated, with severe lung and renal comorbidities. We will continue to follow with you.  For questions or updates, please contact [place] Please consult [email or web address] for contact info under     Signed, Buford Dresser, MD  06/09/2019, 2:51 PM

## 2019-06-09 NOTE — Progress Notes (Signed)
[place] for Electrolyte Monitoring and Replacement   Recent Labs: Potassium (mmol/L)  Date Value  06/09/2019 3.3 (L)  05/15/2013 4.0   Magnesium (mg/dL)  Date Value  06/08/2019 2.4   Calcium (mg/dL)  Date Value  06/09/2019 9.1   Calcium, Total (mg/dL)  Date Value  05/15/2013 8.0 (L)   Albumin (g/dL)  Date Value  06/07/2019 3.3 (L)  04/10/2019 4.1  05/11/2013 3.6   Phosphorus (mg/dL)  Date Value  06/07/2019 4.3   Sodium (mmol/L)  Date Value  06/09/2019 139  04/10/2019 143  05/15/2013 132 (L)     Assessment: 86 year old female with HF exacerbation. Patient s/p respiratory arrest 2/4 requiring intubation and subsequent transfer to the ICU. Patient remains on Lasix drip at 10 mg/hr.  Goal of Therapy:  Electrolytes WNL  Plan:  Potassium low s/t diuresis. Will give potassium 40 mEq per tube x 1 this morning and an additional 20 mEq this evening. Patient with decreased renal function, will replace conservatively.  Tawnya Crook ,PharmD Clinical Pharmacist 06/09/2019 7:39 AM

## 2019-06-09 NOTE — Progress Notes (Signed)
PHARMACY NOTE:  ANTIMICROBIAL RENAL DOSAGE ADJUSTMENT  Current antimicrobial regimen includes a mismatch between antimicrobial dosage and estimated renal function.  As per policy approved by the Pharmacy & Therapeutics and Medical Executive Committees, the antimicrobial dosage will be adjusted accordingly.  Current antimicrobial dosage: Ceftazidime 1gm every 8 hours Indication:pneumonia  Renal Function:CrCl=9.28ml/hr  Estimated Creatinine Clearance: 9.9 mL/min (A) (by C-G formula based on SCr of 3.88 mg/dL (H)). []      On intermittent HD, scheduled: []      On CRRT    Antimicrobial dosage has been changed to: 500mg  every 24 hours Additional comments: Patient to begin HD on Monday or Tuesday  Thank you for allowing pharmacy to be a part of this patient's care.  [place], [facility] 06/09/2019 10:24 AM

## 2019-06-09 NOTE — Progress Notes (Signed)
CRITICAL CARE PROGRESS NOTE    Name: Lori Stanley MRN: [id number] DOB: 20-Mar-1932     LOS: 4   SUBJECTIVE FINDINGS & SIGNIFICANT EVENTS   Patient description:  86 yo female admitted to the telemetry unit with acute on chronic hypoxic respiratory failure secondary to acute diastolic CHF exacerbation.  On 02/4 pt respiratory arrested requiring mechanical intubation and transfer to ICU  Lines / Drains: PIV  Cultures / Sepsis markers: COVID negative  Antibiotics: none   Protocols / Consultants: Vascular , Nephrology, PCCM, hospitalist  06/08/19 - patient has been diuresing well, we did ultrasound asessment at bedside and pleural effusion is now small.  06/09/19 -patient has thickened airway secretions this am which were sent for micro, empiric abx broadened, attempting weaning SBT trial today.  Discussed care plan with niece [name] today.   PAST MEDICAL HISTORY   Past Medical History:  Diagnosis Date  . Acute heart failure ([place])   . Chronic low back pain   . Diabetes mellitus without complication ([place])   . Hyperlipidemia   . Hypertension   . MI (myocardial infarction) ([place])   . Obesity      SURGICAL HISTORY   Past Surgical History:  Procedure Laterality Date  . EYE SURGERY    . KNEE SURGERY    . THYROID SURGERY    . VAGINAL HYSTERECTOMY       FAMILY HISTORY   Family History  Problem Relation Age of Onset  . Heart attack Mother   . Heart disease Sister   . Cancer Sister      SOCIAL HISTORY   Social History   Tobacco Use  . Smoking status: Former Smoker    Types: Cigarettes  . Smokeless tobacco: Never Used  . Tobacco comment: Quit in 2015-2016  Substance Use Topics  . Alcohol use: No  . Drug use: No     MEDICATIONS   Current Medication:  Current  Facility-Administered Medications:  .  0.9 %  sodium chloride infusion, 250 mL, Intravenous, PRN, Mansy, Jan A, MD .  acetaminophen (TYLENOL) suppository 650 mg, 650 mg, Rectal, Q4H PRN, Awilda Bill, NP .  aspirin chewable tablet 81 mg, 81 mg, Per Tube, Daily, Awilda Bill, NP, 81 mg at 06/08/19 1018 .  atorvastatin (LIPITOR) tablet 20 mg, 20 mg, Per Tube, q1800, Dallie Piles, RPH, 20 mg at 06/07/19 1805 .  bisacodyl (DULCOLAX) suppository 10 mg, 10 mg, Rectal, Daily PRN, Awilda Bill, NP .  brimonidine (ALPHAGAN) 0.2 % ophthalmic solution 1 drop, 1 drop, Both Eyes, BID, Mansy, Jan A, MD, 1 drop at 06/08/19 2130 .  carvedilol (COREG) tablet 12.5 mg, 12.5 mg, Per Tube, BID, Awilda Bill, NP, 12.5 mg at 06/08/19 2130 .  cefTAZidime (FORTAZ) 1 g in sodium chloride 0.9 % 100 mL IVPB, 1 g, Intravenous, Q8H, Britley Gashi, MD .  chlorhexidine gluconate (MEDLINE KIT) (PERIDEX) 0.12 % solution 15 mL, 15 mL, Mouth Rinse, BID, Blakeney, Dana G, NP, 15 mL at 06/09/19 0854 .  Chlorhexidine Gluconate Cloth 2 % PADS 6 each, 6 each, Topical, Daily, Awilda Bill, NP, 6 each at 06/08/19 0900 .  dexmedetomidine (PRECEDEX) 400 MCG/100ML (4 mcg/mL) infusion, 0.4-1.2 mcg/kg/hr, Intravenous, Titrated, Awilda Bill, NP, Stopped at 06/09/19 0940 .  docusate (COLACE) 50 MG/5ML liquid 100 mg, 100 mg, Per Tube, BID, Jayko Voorhees, MD .  dorzolamide-timolol (COSOPT) 22.3-6.8 MG/ML ophthalmic solution 1 drop, 1 drop, Both Eyes, BID, Mansy, Arvella Merles, MD, 1 drop  at 06/08/19 2129 .  doxycycline (VIBRAMYCIN) 100 mg in sodium chloride 0.9 % 250 mL IVPB, 100 mg, Intravenous, Q12H, Aleea Hendry, MD .  epoetin alfa (EPOGEN) injection 10,000 Units, 10,000 Units, Subcutaneous, Weekly, Singh, Harmeet, MD .  famotidine (PEPCID) IVPB 20 mg premix, 20 mg, Intravenous, Q24H, Awilda Bill, NP, Stopped at 06/08/19 2202 .  fentaNYL 2545mg in NS 2523m(1076mml) infusion-PREMIX, 0-400 mcg/hr, Intravenous,  Continuous, KeeDarel Hong NP, Stopped at 06/09/19 0940 .  furosemide (LASIX) 250 mg in dextrose 5 % 250 mL (1 mg/mL) infusion, 10 mg/hr, Intravenous, Continuous, [place], EliBing NeighborsP, Last Rate: 10 mL/hr at 06/09/19 0603, 10 mg/hr at 06/09/19 0603 .  hydrALAZINE (APRESOLINE) injection 10-20 mg, 10-20 mg, Intravenous, Q4H PRN, BlaAwilda BillP, 20 mg at 06/08/19 1939 .  insulin aspart (novoLOG) injection 0-9 Units, 0-9 Units, Subcutaneous, Q4H, BlaAwilda BillP, 2 Units at 06/08/19 1939 .  ipratropium-albuterol (DUONEB) 0.5-2.5 (3) MG/3ML nebulizer solution 3 mL, 3 mL, Nebulization, QID, LaiEnzo BiD, 3 mL at 06/09/19 0737 .  latanoprost (XALATAN) 0.005 % ophthalmic solution 1 drop, 1 drop, Left Eye, QHS, Mansy, Jan A, MD, 1 drop at 06/08/19 2129 .  MEDLINE mouth rinse, 15 mL, Mouth Rinse, 10 times per day, BlaAwilda BillP, 15 mL at 06/09/19 0526 .  olopatadine (PATANOL) 0.1 % ophthalmic solution 1 drop, 1 drop, Both Eyes, BID, Mansy, Jan A, MD, 1 drop at 06/08/19 2130 .  ondansetron (ZOFRAN) injection 4 mg, 4 mg, Intravenous, Q6H PRN, Mansy, Jan A, MD .  potassium chloride (KLOR-CON) packet 20 mEq, 20 mEq, Oral, Once, Henryetta Corriveau, MD .  potassium chloride (KLOR-CON) packet 40 mEq, 40 mEq, Per Tube, Once, AleOttie GlazierD .  sertraline (ZOLOFT) tablet 25 mg, 25 mg, Per Tube, Daily, BlaAwilda BillP, 25 mg at 06/08/19 1019 .  sodium chloride flush (NS) 0.9 % injection 3 mL, 3 mL, Intravenous, Q12H, Mansy, Jan A, MD, 3 mL at 06/08/19 2129 .  sodium chloride flush (NS) 0.9 % injection 3 mL, 3 mL, Intravenous, PRN, Mansy, Jan A, MD .  vitamin B-12 (CYANOCOBALAMIN) tablet 1,000 mcg, 1,000 mcg, Per Tube, Daily, BlaAwilda BillP, 1,000 mcg at 06/08/19 1019    ALLERGIES   Antihistamines, chlorpheniramine-type; Codeine; and Paxil [paroxetine]    REVIEW OF SYSTEMS     Unable to obtain due to mechanical ventilation  PHYSICAL EXAMINATION   Vital  Signs: Temp:  [95 F (35 C)-100.6 F (38.1 C)] 99.2 F (37.3 C) (02/07 0910) Pulse Rate:  [58-90] 86 (02/07 0910) Resp:  [25] 25 (02/07 0800) BP: (114-178)/(54-74) 155/66 (02/07 0800) SpO2:  [90 %-100 %] 90 % (02/07 0910) FiO2 (%):  [28 %] 28 % (02/07 0737) Weight:  [68.6 kg] 68.6 kg (02/07 0358)  GENERAL: Chronically ill-appearing HEAD: Normocephalic, atraumatic.  EYES: Pupils equal, round, reactive to light.  No scleral icterus.  MOUTH: Moist mucosal membrane. NECK: Supple. No thyromegaly. No nodules. No JVD.  PULMONARY: Bibasilar crackles without wheezing CARDIOVASCULAR: S1 and S2. Regular rate and rhythm. No murmurs, rubs, or gallops.  GASTROINTESTINAL: Soft, nontender, non-distended. No masses. Positive bowel sounds. No hepatosplenomegaly.  MUSCULOSKELETAL: No swelling, clubbing, or edema.  NEUROLOGIC: Intubated on mechanical ventilation with GCS of 4 SKIN:intact,warm,dry   PERTINENT DATA     Infusions: . sodium chloride    . cefTAZidime (FORTAZ)  IV    . dexmedetomidine (PRECEDEX) IV infusion Stopped (06/09/19 0940)  . doxycycline (VIBRAMYCIN) IV    .  famotidine (PEPCID) IV Stopped (06/08/19 2202)  . fentaNYL infusion INTRAVENOUS Stopped (06/09/19 0940)  . furosemide (LASIX) infusion 10 mg/hr (06/09/19 0603)   Scheduled Medications: . aspirin  81 mg Per Tube Daily  . atorvastatin  20 mg Per Tube q1800  . brimonidine  1 drop Both Eyes BID  . carvedilol  12.5 mg Per Tube BID  . chlorhexidine gluconate (MEDLINE KIT)  15 mL Mouth Rinse BID  . Chlorhexidine Gluconate Cloth  6 each Topical Daily  . docusate  100 mg Per Tube BID  . dorzolamide-timolol  1 drop Both Eyes BID  . epoetin (EPOGEN/PROCRIT) injection  10,000 Units Subcutaneous Weekly  . insulin aspart  0-9 Units Subcutaneous Q4H  . ipratropium-albuterol  3 mL Nebulization QID  . latanoprost  1 drop Left Eye QHS  . mouth rinse  15 mL Mouth Rinse 10 times per day  . olopatadine  1 drop Both Eyes BID  .  potassium chloride  20 mEq Oral Once  . potassium chloride  40 mEq Per Tube Once  . sertraline  25 mg Per Tube Daily  . sodium chloride flush  3 mL Intravenous Q12H  . vitamin B-12  1,000 mcg Per Tube Daily   PRN Medications: sodium chloride, acetaminophen, bisacodyl, hydrALAZINE, ondansetron (ZOFRAN) IV, sodium chloride flush Hemodynamic parameters:   Intake/Output: 02/06 0701 - 02/07 0700 In: 1232 [I.V.:1092; NG/GT:90; IV Piggyback:50] Out: 3450 [Urine:3450]  Ventilator  Settings: Vent Mode: PRVC FiO2 (%):  [28 %] 28 % Set Rate:  [20 bmp] 20 bmp Vt Set:  [500 mL] 500 mL PEEP:  [5 cmH20] 5 cmH20 Plateau Pressure:  [17 cmH20] 17 cmH20    LAB RESULTS:  Basic Metabolic Panel: Recent Labs  Lab 06/06/19 0621 06/06/19 0913 06/06/19 2023 06/06/19 2023 06/07/19 0612 06/07/19 0612 06/07/19 0629 06/07/19 0629 06/08/19 0326 06/09/19 0448  NA   < >  --  137  --  139  --  138  --  139 139  K   < >  --  4.7   < > 4.2   < > 4.2   < > 3.7 3.3*  CL   < >  --  103  --  103  --  103  --  104 105  CO2   < >  --  25  --  25  --  26  --  24 22  GLUCOSE   < >  --  147*  --  111*  --  109*  --  113* 116*  BUN   < >  --  65*  --  63*  --  66*  --  65* 66*  CREATININE   < >  --  3.74*  --  3.81*  --  3.95*  --  3.75* 3.88*  CALCIUM   < >  --  9.6  --  9.5  --  9.4  --  9.3 9.1  MG  --  2.5*  --   --   --   --  2.6*  --  2.4  --   PHOS  --   --   --   --  4.3  --   --   --   --   --    < > = values in this interval not displayed.   Liver Function Tests: Recent Labs  Lab 06/05/19 0102 06/06/19 2023 06/07/19 0612  AST 26 26  --   ALT 19 23  --     ALKPHOS 69 68  --   BILITOT 0.6 0.6  --   PROT 6.7 6.3*  --   ALBUMIN 3.6 3.5 3.3*   No results for input(s): LIPASE, AMYLASE in the last 168 hours. No results for input(s): AMMONIA in the last 168 hours. CBC: Recent Labs  Lab 06/05/19 0647 06/05/19 0647 06/06/19 0913 06/06/19 2023 06/07/19 0629 06/08/19 0326 06/09/19 0448  WBC  5.3   < > 5.5 6.0 6.5 6.9 9.1  NEUTROABS 4.3  --   --  5.2  --   --   --   HGB 7.1*   < > 7.5* 8.6* 8.0* 8.8* 8.9*  HCT 23.6*   < > 25.4* 29.0* 25.5* 26.6* 26.9*  MCV 88.7   < > 89.4 88.4 86.4 82.4 81.0  PLT 210   < > 246 267 243 263 250   < > = values in this interval not displayed.   Cardiac Enzymes: No results for input(s): CKTOTAL, CKMB, CKMBINDEX, TROPONINI in the last 168 hours. BNP: Invalid input(s): POCBNP CBG: Recent Labs  Lab 06/08/19 1602 06/08/19 1927 06/08/19 2322 06/09/19 0335 06/09/19 0719  GLUCAP 154* 153* 109* 112* 120*     IMAGING RESULTS:  Imaging: PERIPHERAL VASCULAR CATHETERIZATION  Result Date: 06/07/2019 See Op Note  DG Chest Port 1 View  Result Date: 06/08/2019 CLINICAL DATA:  Acute respiratory failure. EXAM: PORTABLE CHEST 1 VIEW COMPARISON:  June 06, 2019. FINDINGS: Stable cardiomediastinal silhouette. Endotracheal tube is unchanged in position. Interval placement of nasogastric tube which is seen entering stomach. Interval placement of right internal jugular catheter with distal tip in expected position of cavoatrial junction. Mild central pulmonary vascular congestion is noted with possible bilateral pulmonary edema. Small left pleural effusion may be present. Bony thorax is unremarkable. IMPRESSION: Interval placement of nasogastric tube and right internal jugular catheter. Mild central pulmonary vascular congestion is noted with possible bilateral pulmonary edema. Electronically Signed   By: James  Green Jr M.D.   On: 06/08/2019 10:28      ASSESSMENT AND PLAN    -Multidisciplinary rounds held today  Severe acute Hypoxic Respiratory Failure -Due to acute on chronic decompensated diastolic CHF -cannot rule out infectious etiology - sending tracheal aspirate today due to thickened secretions noted this am.  Empiric abx with ceftaz/doxy -Currently on mechanical ventilation post respiratory etiology of cardiac arrest -Lasix GTT -Strict I's and  O's with Foley catheter -continue Full MV support -continue Bronchodilator Therapy -Wean Fio2 and PEEP as tolerated -will perform SAT/SBT when respiratory parameters are met Net -4.7L -USA at bedside with minimal pleural effusion  Status post cardiac arrest likely due to respiratory failure with hypoxemia -Cardiology on case appreciate input -Lasix drip currently ICU telemetry monitoring  Renal Failure acute on chronic stage V with proteinuria Nephrology on case-appreciate input -Possible EPO and HD -Plan for subclavian port insertion with vascular surgery -follow chem 7 -follow UO -continue Foley Catheter-assess need daily    ID -continue IV abx as prescibed -follow up cultures  GI/Nutrition GI PROPHYLAXIS as indicated DIET-->TF's as tolerated Constipation protocol as indicated  ENDO - ICU hypoglycemic\Hyperglycemia protocol -check FSBS per protocol   ELECTROLYTES -follow labs as needed -replace as needed -pharmacy consultation   DVT/GI PRX ordered -SCDs  TRANSFUSIONS AS NEEDED MONITOR FSBS ASSESS the need for LABS as needed   Critical care provider statement:    Critical care time (minutes):  33   Critical care time was exclusive of:  Separately billable procedures and treating other patients     Critical care was necessary to treat or prevent imminent or life-threatening deterioration of the following conditions:   Severe acute hypoxemic respiratory failure, status post cardiac arrest, acute on chronic renal failure, essential hypertension, diabetes mellitus, anemia, hyperparathyroidism, multiple comorbid conditions   Critical care was time spent personally by me on the following activities:  Development of treatment plan with patient or surrogate, discussions with consultants, evaluation of patient's response to treatment, examination of patient, obtaining history from patient or surrogate, ordering and performing treatments and interventions, ordering and  review of laboratory studies and re-evaluation of patient's condition.  I assumed direction of critical care for this patient from another provider in my specialty: no    This document was prepared using Dragon voice recognition software and may include unintentional dictation errors.    Fuad Aleskerov, M.D.  Division of Pulmonary & Critical Care Medicine  [facility]                                                                                                        

## 2019-06-09 NOTE — Progress Notes (Signed)
[place], [place] 06/09/19  Subjective:   Hospital day # 4 Patient remains critically ill, intubated sedated  Eyes open. Not following commands this morning but icu team is trying to wake her up cvs: Currently on furosemide drip pulm: Ventilator dependent, FiO2 30% Precedex, fentanyl gi: OG tube present Renal: Foley in place.  Good urine output 02/06 0701 - 02/07 0700 In: 1232 [I.V.:1092; NG/GT:90; IV Piggyback:50] Out: 6440 [Urine:3450] Lab Results  Component Value Date   CREATININE 3.88 (H) 06/09/2019   CREATININE 3.75 (H) 06/08/2019   CREATININE 3.95 (H) 06/07/2019     Objective:  Vital signs in last 24 hours:  Temp:  [95 F (35 C)-100.6 F (38.1 C)] 99.2 F (37.3 C) (02/07 0910) Pulse Rate:  [58-90] 86 (02/07 0910) Resp:  [25] 25 (02/07 0800) BP: (114-180)/(54-74) 155/66 (02/07 0800) SpO2:  [90 %-100 %] 90 % (02/07 0910) FiO2 (%):  [28 %] 28 % (02/07 0737) Weight:  [68.6 kg] 68.6 kg (02/07 0358)  Weight change: 1.3 kg Filed Weights   06/06/19 0418 06/08/19 0346 06/09/19 0358  Weight: 70.4 kg 67.3 kg 68.6 kg    Intake/Output:    Intake/Output Summary (Last 24 hours) at 06/09/2019 0933 Last data filed at 06/09/2019 3474 Gross per 24 hour  Intake 1232.03 ml  Output 2600 ml  Net -1367.97 ml     Physical Exam: General:  Critically ill-appearing, laying in the bed  HEENT  ET tube, OG tube  Pulm/lungs  ventilator assisted  CVS/Heart  regular, no rub  Abdomen:   Soft  Extremities:  Peripheral edema present  Neurologic:  eys open, did not follow commands  Skin:  Warm  Access:  Right IJ PermCath       Basic Metabolic Panel:  Recent Labs  Lab 06/06/19 2595 06/06/19 0913 06/06/19 2023 06/06/19 2023 06/07/19 0612 06/07/19 0612 06/07/19 0629 06/08/19 0326 06/09/19 0448  NA   < >  --  137  --  139  --  138 139 139  K   < >  --  4.7  --  4.2  --  4.2 3.7 3.3*  CL   < >  --  103  --  103  --  103 104 105  CO2   < >  --   25  --  25  --  26 24 22   GLUCOSE   < >  --  147*  --  111*  --  109* 113* 116*  BUN   < >  --  65*  --  63*  --  66* 65* 66*  CREATININE   < >  --  3.74*  --  3.81*  --  3.95* 3.75* 3.88*  CALCIUM   < >  --  9.6   < > 9.5   < > 9.4 9.3 9.1  MG  --  2.5*  --   --   --   --  2.6* 2.4  --   PHOS  --   --   --   --  4.3  --   --   --   --    < > = values in this interval not displayed.     CBC: Recent Labs  Lab 06/05/19 0647 06/05/19 0647 06/06/19 0913 06/06/19 2023 06/07/19 0629 06/08/19 0326 06/09/19 0448  WBC 5.3   < > 5.5 6.0 6.5 6.9 9.1  NEUTROABS 4.3  --   --  5.2  --   --   --  HGB 7.1*   < > 7.5* 8.6* 8.0* 8.8* 8.9*  HCT 23.6*   < > 25.4* 29.0* 25.5* 26.6* 26.9*  MCV 88.7   < > 89.4 88.4 86.4 82.4 81.0  PLT 210   < > 246 267 243 263 250   < > = values in this interval not displayed.      Lab Results  Component Value Date   HEPBSAG NON REACTIVE 06/07/2019   HEPBSAB NON REACTIVE 06/07/2019   HEPBIGM NON REACTIVE 06/07/2019      Microbiology:  Recent Results (from the past 240 hour(s))  Respiratory Panel by RT PCR (Flu A&B, Covid) - Nasopharyngeal Swab     Status: None   Collection Time: 06/05/19  2:27 AM   Specimen: Nasopharyngeal Swab  Result Value Ref Range Status   SARS Coronavirus 2 by RT PCR NEGATIVE NEGATIVE Final    Comment: (NOTE) SARS-CoV-2 target nucleic acids are NOT DETECTED. The SARS-CoV-2 RNA is generally detectable in upper respiratoy specimens during the acute phase of infection. The lowest concentration of SARS-CoV-2 viral copies this assay can detect is 131 copies/mL. A negative result does not preclude SARS-Cov-2 infection and should not be used as the sole basis for treatment or other patient management decisions. A negative result may occur with  improper specimen collection/handling, submission of specimen other than nasopharyngeal swab, presence of viral mutation(s) within the areas targeted by this assay, and inadequate number of  viral copies (<131 copies/mL). A negative result must be combined with clinical observations, patient history, and epidemiological information. The expected result is Negative. Fact Sheet for Patients:  PinkCheek.be Fact Sheet for Healthcare Providers:  GravelBags.it This test is not yet ap proved or cleared by the [place] FDA and  has been authorized for detection and/or diagnosis of SARS-CoV-2 by FDA under an Emergency Use Authorization (EUA). This EUA will remain  in effect (meaning this test can be used) for the duration of the COVID-19 declaration under Section 564(b)(1) of the Act, 21 U.S.C. section 360bbb-3(b)(1), unless the authorization is terminated or revoked sooner.    Influenza A by PCR NEGATIVE NEGATIVE Final   Influenza B by PCR NEGATIVE NEGATIVE Final    Comment: (NOTE) The Xpert Xpress SARS-CoV-2/FLU/RSV assay is intended as an aid in  the diagnosis of influenza from Nasopharyngeal swab specimens and  should not be used as a sole basis for treatment. Nasal washings and  aspirates are unacceptable for Xpert Xpress SARS-CoV-2/FLU/RSV  testing. Fact Sheet for Patients: PinkCheek.be Fact Sheet for Healthcare Providers: GravelBags.it This test is not yet approved or cleared by the [place] FDA and  has been authorized for detection and/or diagnosis of SARS-CoV-2 by  FDA under an Emergency Use Authorization (EUA). This EUA will remain  in effect (meaning this test can be used) for the duration of the  Covid-19 declaration under Section 564(b)(1) of the Act, 21  U.S.C. section 360bbb-3(b)(1), unless the authorization is  terminated or revoked. Performed at [facility], [address] [place], [place] [address]   MRSA PCR Screening     Status: None   Collection Time: 06/07/19 12:53 AM   Specimen: Nasopharyngeal  Result Value Ref  Range Status   MRSA by PCR NEGATIVE NEGATIVE Final    Comment:        The GeneXpert MRSA Assay (FDA approved for NASAL specimens only), is one component of a comprehensive MRSA colonization surveillance program. It is not intended to diagnose MRSA infection nor to guide or  monitor treatment for MRSA infections. Performed at [facility], [address] [place], [place] [address]     Coagulation Studies: Recent Labs    06/07/19 1105  LABPROT 14.9  INR 1.2    Urinalysis: No results for input(s): COLORURINE, LABSPEC, PHURINE, GLUCOSEU, HGBUR, BILIRUBINUR, KETONESUR, PROTEINUR, UROBILINOGEN, NITRITE, LEUKOCYTESUR in the last 72 hours.  Invalid input(s): APPERANCEUR    Imaging: PERIPHERAL VASCULAR CATHETERIZATION  Result Date: 06/07/2019 See Op Note  DG Chest Port 1 View  Result Date: 06/08/2019 CLINICAL DATA:  Acute respiratory failure. EXAM: PORTABLE CHEST 1 VIEW COMPARISON:  June 06, 2019. FINDINGS: Stable cardiomediastinal silhouette. Endotracheal tube is unchanged in position. Interval placement of nasogastric tube which is seen entering stomach. Interval placement of right internal jugular catheter with distal tip in expected position of cavoatrial junction. Mild central pulmonary vascular congestion is noted with possible bilateral pulmonary edema. Small left pleural effusion may be present. Bony thorax is unremarkable. IMPRESSION: Interval placement of nasogastric tube and right internal jugular catheter. Mild central pulmonary vascular congestion is noted with possible bilateral pulmonary edema. Electronically Signed   By: Marijo Conception M.D.   On: 06/08/2019 10:28     Medications:   . sodium chloride    . dexmedetomidine (PRECEDEX) IV infusion 0.6 mcg/kg/hr (06/09/19 0848)  . famotidine (PEPCID) IV Stopped (06/08/19 2202)  . fentaNYL infusion INTRAVENOUS 100 mcg/hr (06/09/19 0848)  . furosemide (LASIX) infusion 10 mg/hr (06/09/19 0603)   . aspirin   81 mg Per Tube Daily  . atorvastatin  20 mg Per Tube q1800  . brimonidine  1 drop Both Eyes BID  . carvedilol  12.5 mg Per Tube BID  . chlorhexidine gluconate (MEDLINE KIT)  15 mL Mouth Rinse BID  . Chlorhexidine Gluconate Cloth  6 each Topical Daily  . docusate  100 mg Per Tube BID  . dorzolamide-timolol  1 drop Both Eyes BID  . epoetin (EPOGEN/PROCRIT) injection  10,000 Units Subcutaneous Weekly  . insulin aspart  0-9 Units Subcutaneous Q4H  . ipratropium-albuterol  3 mL Nebulization QID  . latanoprost  1 drop Left Eye QHS  . mouth rinse  15 mL Mouth Rinse 10 times per day  . olopatadine  1 drop Both Eyes BID  . potassium chloride  20 mEq Oral Once  . potassium chloride  40 mEq Per Tube Once  . sertraline  25 mg Per Tube Daily  . sodium chloride flush  3 mL Intravenous Q12H  . vitamin B-12  1,000 mcg Per Tube Daily   sodium chloride, acetaminophen, bisacodyl, hydrALAZINE, ondansetron (ZOFRAN) IV, sodium chloride flush  Assessment/ Plan:  86 y.o.African [place] female with diabetes mellitus type II, hypertension, anemia, GERD, depression, anxiety, glaucoma, hyperlipidemia, diastolic congestive heart failure, coronary artery disease admitted on 06/05/2019 for SOB (shortness of breath) [R06.02] Acute CHF (congestive heart failure) ([place]) [I50.9] Acute respiratory failure with hypoxia (HCC) [J96.01] Heart failure (HCC) [I50.9] Acute on chronic congestive heart failure, unspecified heart failure type ([place]) [I50.9]   1. ARF on CKD st 5  Chronic kidney disease is likely secondary to diabetic nephropathy and hypertensive nephrosclerosis.  This admission, patient presented via EMS from home for respiratory distress, shortness of breath, worsening lower extremity edema.  She has failed outpatient volume management. Dialysis catheter was placed this admission Overall patient's clinical condition has improved.   We will defer dialysis for now till Monday or Tuesday Creatinine 3.88/GFR  12  2.  Acute respiratory failure Ventilator assisted Hospital course complicated by respiratory  arrest the night of February 4  Currently on furosemide infusion, responding well with good urine output Volume status appears to have optimized  3. Diabetes type 2 with CKD Lab Results  Component Value Date   HGBA1C 6.0 (H) 04/10/2019   4. Anemia of CKD -  Lab Results  Component Value Date   HGB 8.9 (L) 06/09/2019  will start EPO SQ  5. [facility] Lab Results  Component Value Date   PTH 148 (H) 06/07/2019   CALCIUM 9.1 06/09/2019   PHOS 4.3 06/07/2019        LOS: [place] 2/7/20219:33 AM  [place], [place]  Note: This note was prepared with Dragon dictation. Any transcription errors are unintentional

## 2019-06-10 ENCOUNTER — Encounter: Payer: Self-pay | Admitting: Cardiology

## 2019-06-10 LAB — QUANTIFERON-TB GOLD PLUS (RQFGPL)
QuantiFERON Mitogen Value: 5.52 IU/mL
QuantiFERON Nil Value: 0.02 IU/mL
QuantiFERON TB1 Ag Value: 0.02 IU/mL
QuantiFERON TB2 Ag Value: 0.02 IU/mL

## 2019-06-10 LAB — BASIC METABOLIC PANEL
Anion gap: 12 (ref 5–15)
BUN: 71 mg/dL — ABNORMAL HIGH (ref 8–23)
CO2: 21 mmol/L — ABNORMAL LOW (ref 22–32)
Calcium: 8.9 mg/dL (ref 8.9–10.3)
Chloride: 104 mmol/L (ref 98–111)
Creatinine, Ser: 4 mg/dL — ABNORMAL HIGH (ref 0.44–1.00)
GFR calc Af Amer: 11 mL/min — ABNORMAL LOW (ref >60)
GFR calc non Af Amer: 10 mL/min — ABNORMAL LOW (ref >60)
Glucose, Bld: 114 mg/dL — ABNORMAL HIGH (ref 70–99)
Potassium: 3.7 mmol/L (ref 3.5–5.1)
Sodium: 137 mmol/L (ref 135–145)

## 2019-06-10 LAB — GLUCOSE, CAPILLARY
Glucose-Capillary: 102 mg/dL — ABNORMAL HIGH (ref 70–99)
Glucose-Capillary: 117 mg/dL — ABNORMAL HIGH (ref 70–99)
Glucose-Capillary: 122 mg/dL — ABNORMAL HIGH (ref 70–99)
Glucose-Capillary: 126 mg/dL — ABNORMAL HIGH (ref 70–99)
Glucose-Capillary: 141 mg/dL — ABNORMAL HIGH (ref 70–99)
Glucose-Capillary: 95 mg/dL (ref 70–99)

## 2019-06-10 LAB — QUANTIFERON-TB GOLD PLUS: QuantiFERON-TB Gold Plus: NEGATIVE

## 2019-06-10 LAB — PHOSPHORUS: Phosphorus: 4.7 mg/dL — ABNORMAL HIGH (ref 2.5–4.6)

## 2019-06-10 LAB — PROCALCITONIN: Procalcitonin: 6.8 ng/mL

## 2019-06-10 LAB — MAGNESIUM: Magnesium: 2.2 mg/dL (ref 1.7–2.4)

## 2019-06-10 MED ORDER — ASPIRIN 81 MG PO CHEW
81.0000 mg | CHEWABLE_TABLET | Freq: Every day | ORAL | Status: DC
  Administered 2019-06-12 – 2019-06-17 (×6): 81 mg via ORAL
  Filled 2019-06-10 (×7): qty 1

## 2019-06-10 MED ORDER — VANCOMYCIN HCL 1250 MG/250ML IV SOLN
1250.0000 mg | Freq: Once | INTRAVENOUS | Status: AC
  Administered 2019-06-10: 08:00:00 1250 mg via INTRAVENOUS
  Filled 2019-06-10: qty 250

## 2019-06-10 MED ORDER — VITAMIN B-12 1000 MCG PO TABS
1000.0000 ug | ORAL_TABLET | Freq: Every day | ORAL | Status: DC
  Filled 2019-06-10: qty 1

## 2019-06-10 MED ORDER — POTASSIUM CHLORIDE 20 MEQ PO PACK
30.0000 meq | PACK | Freq: Once | ORAL | Status: AC
  Administered 2019-06-10: 30 meq via ORAL
  Filled 2019-06-10: qty 2

## 2019-06-10 MED ORDER — VANCOMYCIN VARIABLE DOSE PER UNSTABLE RENAL FUNCTION (PHARMACIST DOSING): Status: DC

## 2019-06-10 MED ORDER — SERTRALINE HCL 50 MG PO TABS
25.0000 mg | ORAL_TABLET | Freq: Every day | ORAL | Status: DC
  Administered 2019-06-12 – 2019-06-17 (×6): 25 mg via ORAL
  Filled 2019-06-10 (×7): qty 1

## 2019-06-10 MED ORDER — FAMOTIDINE 20 MG PO TABS: 20.0000 mg | ORAL_TABLET | Freq: Every day | ORAL | Status: DC

## 2019-06-10 MED ORDER — CARVEDILOL 12.5 MG PO TABS
12.5000 mg | ORAL_TABLET | Freq: Two times a day (BID) | ORAL | Status: DC
  Administered 2019-06-11 – 2019-06-16 (×10): 12.5 mg via ORAL
  Filled 2019-06-10 (×10): qty 1

## 2019-06-10 MED ORDER — FAMOTIDINE 20 MG PO TABS
20.0000 mg | ORAL_TABLET | Freq: Every day | ORAL | Status: DC
  Administered 2019-06-11 – 2019-06-16 (×6): 20 mg via ORAL
  Filled 2019-06-10 (×6): qty 1

## 2019-06-10 MED ORDER — VANCOMYCIN HCL 1.25 G IV SOLR
1250.0000 mg | Freq: Once | INTRAVENOUS | Status: DC
  Filled 2019-06-10: qty 1250

## 2019-06-10 MED ORDER — ATORVASTATIN CALCIUM 20 MG PO TABS
20.0000 mg | ORAL_TABLET | Freq: Every day | ORAL | Status: DC
  Administered 2019-06-11 – 2019-06-16 (×6): 20 mg via ORAL
  Filled 2019-06-10 (×6): qty 1

## 2019-06-10 NOTE — Progress Notes (Addendum)
 Progress Note  Patient Name: Lori Stanley Date of Encounter: 06/10/2019  Primary Cardiologist: Dr. Arida  Subjective   Still intubated. Opens eyes and responds by noding / shaking head to questions. Denies chest pain.  Inpatient Medications    Scheduled Meds: . aspirin  81 mg Per Tube Daily  . atorvastatin  20 mg Per Tube q1800  . brimonidine  1 drop Both Eyes BID  . carvedilol  12.5 mg Per Tube BID  . chlorhexidine gluconate (MEDLINE KIT)  15 mL Mouth Rinse BID  . Chlorhexidine Gluconate Cloth  6 each Topical Daily  . docusate  100 mg Per Tube BID  . dorzolamide-timolol  1 drop Both Eyes BID  . epoetin (EPOGEN/PROCRIT) injection  10,000 Units Subcutaneous Weekly  . insulin aspart  0-9 Units Subcutaneous Q4H  . ipratropium-albuterol  3 mL Nebulization QID  . latanoprost  1 drop Left Eye QHS  . mouth rinse  15 mL Mouth Rinse 10 times per day  . olopatadine  1 drop Both Eyes BID  . potassium chloride  30 mEq Oral Once  . sertraline  25 mg Per Tube Daily  . sodium chloride flush  3 mL Intravenous Q12H  . vitamin B-12  1,000 mcg Per Tube Daily   Continuous Infusions: . sodium chloride    . cefTAZidime (FORTAZ)  IV 0.5 g (06/09/19 1725)  . dexmedetomidine (PRECEDEX) IV infusion 0.4 mcg/kg/hr (06/10/19 0625)  . doxycycline (VIBRAMYCIN) IV Stopped (06/10/19 0011)  . famotidine (PEPCID) IV Stopped (06/09/19 2141)  . fentaNYL infusion INTRAVENOUS 250 mcg/hr (06/10/19 0625)  . furosemide (LASIX) infusion 10 mg/hr (06/10/19 0625)  . vancomycin 1,250 mg (06/10/19 0828)   PRN Meds: sodium chloride, acetaminophen, bisacodyl, hydrALAZINE, ondansetron (ZOFRAN) IV, sodium chloride flush   Vital Signs    Vitals:   06/10/19 0500 06/10/19 0600 06/10/19 0700 06/10/19 0800  BP: (!) 165/70 (!) 172/83 (!) 154/70 (!) 160/72  Pulse: 72 81 75 79  Resp:   20 20  Temp:    98.2 F (36.8 C)  TempSrc:    Oral  SpO2: 96% 98% 98% 98%  Weight:      Height:        Intake/Output  Summary (Last 24 hours) at 06/10/2019 0845 Last data filed at 06/10/2019 0625 Gross per 24 hour  Intake 1711.92 ml  Output 1600 ml  Net 111.92 ml   Last 3 Weights 06/10/2019 06/09/2019 06/08/2019  Weight (lbs) 147 lb 0.8 oz 151 lb 3.8 oz 148 lb 5.9 oz  Weight (kg) 66.7 kg 68.6 kg 67.3 kg      Telemetry    2/7 sinus tachycardia; 2/8 SR 70-80s, PVCs.   - Personally Reviewed  ECG    No new tracings- Personally Reviewed  Physical Exam   GEN: No acute distress.  Intubated. Eyes open. Neck: No JVD Cardiac: RRR, 1/6 systolic murmur. No rubs or gallops.  Respiratory: Intubated. Coarse breath sounds bilaterally. GI: Soft, nontender, non-distended  MS: Bilateral SCDs, wrinkling in bilateral extremities without edema. No deformity. Neuro:  Intubated Psych: Intubated  Labs    High Sensitivity Troponin:   Recent Labs  Lab 06/05/19 0102 06/05/19 0227 06/06/19 0621 06/06/19 2023 06/06/19 2145  TROPONINIHS 3 4 3 3 4      Cardiac EnzymesNo results for input(s): TROPONINI in the last 168 hours. No results for input(s): TROPIPOC in the last 168 hours.   Chemistry Recent Labs  Lab 06/05/19 0102 06/06/19 0621 06/06/19 2023 06/06/19 2023 06/07/19 0612 06/07/19   0629 06/08/19 0326 06/09/19 0448 06/10/19 0424  NA 136   < > 137   < > 139   < > 139 139 137  K 4.7   < > 4.7   < > 4.2   < > 3.7 3.3* 3.7  CL 102   < > 103   < > 103   < > 104 105 104  CO2 25   < > 25   < > 25   < > 24 22 21*  GLUCOSE 150*   < > 147*   < > 111*   < > 113* 116* 114*  BUN 61*   < > 65*   < > 63*   < > 65* 66* 71*  CREATININE 3.86*   < > 3.74*   < > 3.81*   < > 3.75* 3.88* 4.00*  CALCIUM 9.2   < > 9.6   < > 9.5   < > 9.3 9.1 8.9  PROT 6.7  --  6.3*  --   --   --   --   --   --   ALBUMIN 3.6  --  3.5  --  3.3*  --   --   --   --   AST 26  --  26  --   --   --   --   --   --   ALT 19  --  23  --   --   --   --   --   --   ALKPHOS 69  --  68  --   --   --   --   --   --   BILITOT 0.6  --  0.6  --   --   --    --   --   --   GFRNONAA 10*   < > 10*   < > 10*   < > 10* 10* 10*  GFRAA 12*   < > 12*   < > 12*   < > 12* 12* 11*  ANIONGAP 9   < > 9   < > 11   < > 11 12 12   < > = values in this interval not displayed.     Hematology Recent Labs  Lab 06/07/19 0629 06/08/19 0326 06/09/19 0448  WBC 6.5 6.9 9.1  RBC 2.95* 3.23* 3.32*  HGB 8.0* 8.8* 8.9*  HCT 25.5* 26.6* 26.9*  MCV 86.4 82.4 81.0  MCH 27.1 27.2 26.8  MCHC 31.4 33.1 33.1  RDW 14.4 14.5 14.5  PLT 243 263 250    BNP Recent Labs  Lab 06/05/19 0102 06/06/19 2023  BNP 577.0* 549.0*     DDimer No results for input(s): DDIMER in the last 168 hours.   Radiology    DG Chest Port 1 View  Result Date: 06/09/2019 CLINICAL DATA:  Pulmonary disease. EXAM: PORTABLE CHEST 1 VIEW COMPARISON:  June 07, 2019. FINDINGS: Stable cardiomediastinal silhouette. Endotracheal and nasogastric tubes are unchanged in position. Right internal jugular catheter is unchanged. No pneumothorax is noted. Mild central pulmonary vascular congestion is noted. Increased bibasilar atelectasis or edema is noted with small pleural effusions. Bony thorax is unremarkable. IMPRESSION: Stable support apparatus. Increased bibasilar atelectasis or edema is noted with small pleural effusions. Electronically Signed   By: James  Green Jr M.D.   On: 06/09/2019 10:47    Cardiac Studies   Echo 06/05/2019 1. Left ventricular ejection fraction, by visual estimation, is   60 to  65%. The left ventricle has normal function. There is moderately increased  left ventricular hypertrophy.  2. The left ventricle has no regional wall motion abnormalities.  3. Left ventricular diastolic parameters are consistent with Grade II  diastolic dysfunction (pseudonormalization).  4. Global right ventricle has normal systolic function.The right  ventricular size is normal. No increase in right ventricular wall  thickness.  5. Left atrial size was severely dilated.  6. Mildly elevated  pulmonary artery systolic pressure.  7. The inferior vena cava is dilated in size with <50% respiratory  variability, suggesting right atrial pressure of 15 mmHg.  8. pleural effusion noted on left, 7 cm  9. Small pericardial effusion.   05/30/2013 NM Myoview No significant wall motion abnormality noted. Overall, low risk scan. No significant ischemia. EF 64%. LV global function normal. No EKG changes concerning for ischemia. Attenuation artifact noted in the study.  Patient Profile     87 y.o. female with a history of chronic diastolic heart failure, hypertension, DM2, CKD, and who is being seen today for the evaluation of acute on chronic diastolic heart failure.  Assessment & Plan    Acute on chronic diastolicheart failure -Intubated 2/2 worsening respiratory status.IV diuresis has been complicated by AOCKD and respiratory status this admission.  Currently on IV lasix drip at 10mg/hr with improved volume status, worsening renal function.  I/Os net -4.3L for the admission and positive output / urine output slowing; +112.0cc yesterday. Wt. 155.3lbs  147.05lbs. Permcath placed over the weekend and notes indicating rough plan for dialysis today or tomorrow. Will defer volume management to nephrology at this time. Continue to monitor daily BMETto monitor renal function and electrolytes, daily weights. Continue BB as HR allows for BP support. Consider additional BP support as below.  Respiratory failure --Intubated. Respiratory failure 2/4. Per CCM.  Hypertension --Still poorly controlled.Continue Coreg 12.5mg BID and hydralazine PRN. Titrate as HR allows for optimal BP support.PTA amlodipine fell off of list at admission. Current renal function precludes the addition ofACE/ARB/ARNI/MRA.   Hypokalemia --Replete with goal 4.0. Check Mg with goal 2.0. Daily BMET.  Hyperlipidemia --Restarted statin, as she had discontinued this as an outpatient. LFTs and lipids in 6 to 8  weeks.  DM2 --Recommend strict glycemic control for risk factor modification. Currently not well controlled.04/2019 HgbA1c 6.0. SSI. Per IM.  AOCKD --Currently followed by [facility]. Consider poorly controlled diabetes as contributing to current renal function, as well as poorly controlled BP. Avoid nephrotoxins, including contrast studies. No ACE/ARB/ARNI/MRA given renal function.   Anemia, suspect anemia of chronic dz --Daily CBC. On Epogen. Suspect at least in part anemia of chronic dz. Recommend maintain Hgb above 8.0. Further workup per IM / nephrology.   For questions or updates, please contact [facility] Please consult [email or web address] for contact info under        Signed, Jacquelyn D Visser, PA-C  06/10/2019, 8:45 AM     

## 2019-06-10 NOTE — Progress Notes (Signed)
[facility] for Electrolyte Monitoring and Replacement   Recent Labs: Potassium (mmol/L)  Date Value  06/10/2019 3.7  05/15/2013 4.0   Magnesium (mg/dL)  Date Value  06/10/2019 2.2   Calcium (mg/dL)  Date Value  06/10/2019 8.9   Calcium, Total (mg/dL)  Date Value  05/15/2013 8.0 (L)   Albumin (g/dL)  Date Value  06/07/2019 3.3 (L)  04/10/2019 4.1  05/11/2013 3.6   Phosphorus (mg/dL)  Date Value  06/10/2019 4.7 (H)   Sodium (mmol/L)  Date Value  06/10/2019 137  04/10/2019 143  05/15/2013 132 (L)     Assessment: Patient extubated this morning. Potassium 2mEq given prior to extubation.   Will obtain electrolytes with am labs.   Pharmacy will continue to monitor and adjust per consult.   Sairah Knobloch L 06/10/2019 5:06 PM

## 2019-06-10 NOTE — Progress Notes (Signed)
Pharmacy Antibiotic Note  Lori Stanley is a 86 y.o. female admitted on 06/05/2019 with pneumonia.  Pharmacy has been consulted for vancomycin dosing.  Plan: Vancomycin 1250mg  IV x 1 ordered. Will obtain renal function with am labs and redose as appropriate.   Continue Ceftaz 0.5mg  IV Daily.   Height: 5' 2.99" (160 cm) Weight: 147 lb 0.8 oz (66.7 kg) IBW/kg (Calculated) : 52.38  Temp (24hrs), Avg:98.6 F (37 C), Min:97.7 F (36.5 C), Max:99.1 F (37.3 C)  Recent Labs  Lab 06/06/19 0621 06/06/19 0913 06/06/19 2023 06/06/19 2023 06/07/19 0612 06/07/19 0629 06/08/19 0326 06/09/19 0448 06/10/19 0424  WBC  --  5.5 6.0  --   --  6.5 6.9 9.1  --   CREATININE   < >  --  3.74*   < > 3.81* 3.95* 3.75* 3.88* 4.00*   < > = values in this interval not displayed.    Estimated Creatinine Clearance: 9.4 mL/min (A) (by C-G formula based on SCr of 4 mg/dL (H)).    Allergies  Allergen Reactions  . Antihistamines, Chlorpheniramine-Type   . Codeine   . Paxil [Paroxetine]     "makes her feel funny" not in a good way    Antimicrobials this admission: Doxycycline 2/7  Ceftazidime 2/7 >>  Vancomycin 2/8 >>   Dose adjustments this admission: N/A  Microbiology results: 2/7 Sputum: reincubated for better growth  2/5 MRSA PCR: negative  2/3 Coronavirus PCR: negative  2/3 Influenza A/B: nega  Thank you for allowing pharmacy to be a part of this patient's care.  Tahliyah Anagnos L 06/10/2019 5:10 PM

## 2019-06-10 NOTE — Progress Notes (Signed)
PHARMACIST - PHYSICIAN COMMUNICATION  CONCERNING: IV to Oral Route Change Policy  RECOMMENDATION: This patient is receiving famotidine by the intravenous route.  Based on criteria approved by the Pharmacy and Therapeutics Committee, the intravenous medication(s) is/are being converted to the equivalent oral dose form(s).   DESCRIPTION: These criteria include:  The patient is eating (either orally or via tube) and/or has been taking other orally administered medications for a least 24 hours  The patient has no evidence of active gastrointestinal bleeding or impaired GI absorption (gastrectomy, short bowel, patient on TNA or NPO).  If you have questions about this conversion, please contact the Pharmacy Department at [phone].   Tashayla Therien L, [facility] 06/10/2019 9:09 AM

## 2019-06-10 NOTE — Progress Notes (Signed)
Patient extubated this shift. Initially was on nasal cannula but had a moment of anxiety and hypoxia in the low 80s. Patient appeared to be mouth breathing and could not breathe in through her nose with nasal cannula so placed patient on venturi mask. Patient had another event similar to this in the afternoon when RT trialed her back on her nasal cannula after her breathing treatment. Patient alert all shift. Voice initally hoarse and extremely quiet after extubation but improved slightly by end of shift. Lasix infusion continued per nephrology.

## 2019-06-10 NOTE — Progress Notes (Signed)
Pt. Extubated and placed on 2lnc,hr 76,rr 18,sat 93,no apparent distress noted at this time.

## 2019-06-10 NOTE — Progress Notes (Signed)
CRITICAL CARE PROGRESS NOTE    Name: Lori Stanley MRN: [id number] DOB: 01-23-1932     LOS: 5   SUBJECTIVE FINDINGS & SIGNIFICANT EVENTS   Patient description:  86 yo female admitted to the telemetry unit with acute on chronic hypoxic respiratory failure secondary to acute diastolic CHF exacerbation.  On 02/4 pt respiratory arrested requiring mechanical intubation and transfer to ICU  Lines / Drains: PIV  Cultures / Sepsis markers: COVID negative  Antibiotics: vanco/ceftaz   Protocols / Consultants: Vascular , Nephrology, PCCM, hospitalist  06/08/19 - patient has been diuresing well, we did ultrasound asessment at bedside and pleural effusion is now small.  06/09/19 -patient has thickened airway secretions this am which were sent for micro, empiric abx broadened, attempting weaning SBT trial today.  Discussed care plan with niece [name] today.  06/10/19 -airway secretions are significantly improved. Will perform SBT today and liberate from MV  PAST MEDICAL HISTORY   Past Medical History:  Diagnosis Date  . Acute heart failure ([place])   . Chronic low back pain   . Diabetes mellitus without complication ([place])   . Hyperlipidemia   . Hypertension   . MI (myocardial infarction) ([place])   . Obesity      SURGICAL HISTORY   Past Surgical History:  Procedure Laterality Date  . DIALYSIS/PERMA CATHETER INSERTION N/A 06/07/2019   Procedure: DIALYSIS/PERMA CATHETER INSERTION;  Surgeon: Katha Cabal, MD;  Location: [facility];  Service: Cardiovascular;  Laterality: N/A;  . EYE SURGERY    . KNEE SURGERY    . THYROID SURGERY    . VAGINAL HYSTERECTOMY       FAMILY HISTORY   Family History  Problem Relation Age of Onset  . Heart attack Mother   . Heart disease Sister   . Cancer Sister       SOCIAL HISTORY   Social History   Tobacco Use  . Smoking status: Former Smoker    Types: Cigarettes  . Smokeless tobacco: Never Used  . Tobacco comment: Quit in 2015-2016  Substance Use Topics  . Alcohol use: No  . Drug use: No     MEDICATIONS   Current Medication:  Current Facility-Administered Medications:  .  0.9 %  sodium chloride infusion, 250 mL, Intravenous, PRN, Mansy, Jan A, MD .  acetaminophen (TYLENOL) suppository 650 mg, 650 mg, Rectal, Q4H PRN, Awilda Bill, NP .  aspirin chewable tablet 81 mg, 81 mg, Per Tube, Daily, Awilda Bill, NP, 81 mg at 06/10/19 0915 .  atorvastatin (LIPITOR) tablet 20 mg, 20 mg, Per Tube, q1800, Dallie Piles, RPH, 20 mg at 06/09/19 1807 .  bisacodyl (DULCOLAX) suppository 10 mg, 10 mg, Rectal, Daily PRN, Awilda Bill, NP .  brimonidine (ALPHAGAN) 0.2 % ophthalmic solution 1 drop, 1 drop, Both Eyes, BID, Mansy, Jan A, MD, 1 drop at 06/10/19 0916 .  carvedilol (COREG) tablet 12.5 mg, 12.5 mg, Per Tube, BID, Awilda Bill, NP, 12.5 mg at 06/10/19 0915 .  cefTAZidime (FORTAZ) 0.5 g in dextrose 5 % 50 mL IVPB, 0.5 g, Intravenous, Q24H, Tamber Burtch, MD, Last Rate: 100 mL/hr at 06/09/19 1725, 0.5 g at 06/09/19 1725 .  chlorhexidine gluconate (MEDLINE KIT) (PERIDEX) 0.12 % solution 15 mL, 15 mL, Mouth Rinse, BID, Blakeney, Dreama Saa, NP, 15 mL at 06/10/19 0824 .  Chlorhexidine Gluconate Cloth 2 % PADS 6 each, 6 each, Topical, Daily, Awilda Bill, NP, 6 each at 06/09/19 1534 .  dexmedetomidine (  PRECEDEX) 400 MCG/100ML (4 mcg/mL) infusion, 0.4-1.2 mcg/kg/hr, Intravenous, Titrated, Blakeney, Dreama Saa, NP, Last Rate: 7.04 mL/hr at 06/10/19 0625, 0.4 mcg/kg/hr at 06/10/19 0625 .  docusate (COLACE) 50 MG/5ML liquid 100 mg, 100 mg, Per Tube, BID, Lanney Gins, Brystal Kildow, MD, 100 mg at 06/10/19 0915 .  dorzolamide-timolol (COSOPT) 22.3-6.8 MG/ML ophthalmic solution 1 drop, 1 drop, Both Eyes, BID, Mansy, Jan A, MD, 1 drop at 06/10/19 0916 .   epoetin alfa (EPOGEN) injection 10,000 Units, 10,000 Units, Subcutaneous, Weekly, Singh, Harmeet, MD .  famotidine (PEPCID) tablet 20 mg, 20 mg, Per Tube, QHS, Charlett Nose, RPH .  fentaNYL 2527mg in NS 2561m(1073mml) infusion-PREMIX, 0-400 mcg/hr, Intravenous, Continuous, KeeDarel Hong NP, Last Rate: 25 mL/hr at 06/10/19 0625, 250 mcg/hr at 06/10/19 0625 .  furosemide (LASIX) 250 mg in dextrose 5 % 250 mL (1 mg/mL) infusion, 10 mg/hr, Intravenous, Continuous, Ouma, EliBing NeighborsP, Last Rate: 10 mL/hr at 06/10/19 0625, 10 mg/hr at 06/10/19 [id number] hydrALAZINE (APRESOLINE) injection 10-20 mg, 10-20 mg, Intravenous, Q4H PRN, BlaAwilda BillP, 20 mg at 06/08/19 1939 .  insulin aspart (novoLOG) injection 0-9 Units, 0-9 Units, Subcutaneous, Q4H, BlaAwilda BillP, 1 Units at 06/10/19 0004 .  ipratropium-albuterol (DUONEB) 0.5-2.5 (3) MG/3ML nebulizer solution 3 mL, 3 mL, Nebulization, QID, LaiEnzo BiD, 3 mL at 06/10/19 0843 .  latanoprost (XALATAN) 0.005 % ophthalmic solution 1 drop, 1 drop, Left Eye, QHS, Mansy, Jan A, MD, 1 drop at 06/09/19 2109 .  MEDLINE mouth rinse, 15 mL, Mouth Rinse, 10 times per day, BlaAwilda BillP, 15 mL at 06/10/19 0557 .  olopatadine (PATANOL) 0.1 % ophthalmic solution 1 drop, 1 drop, Both Eyes, BID, Mansy, Jan A, MD, 1 drop at 06/10/19 0916 .  ondansetron (ZOFRAN) injection 4 mg, 4 mg, Intravenous, Q6H PRN, Mansy, Jan A, MD .  sertraline (ZOLOFT) tablet 25 mg, 25 mg, Per Tube, Daily, BlaAwilda BillP, 25 mg at 06/10/19 0915 .  sodium chloride flush (NS) 0.9 % injection 3 mL, 3 mL, Intravenous, Q12H, Mansy, Jan A, MD, 3 mL at 06/10/19 0917 .  sodium chloride flush (NS) 0.9 % injection 3 mL, 3 mL, Intravenous, PRN, Mansy, Jan A, MD .  vitamin B-12 (CYANOCOBALAMIN) tablet 1,000 mcg, 1,000 mcg, Per Tube, Daily, BlaAwilda BillP, 1,000 mcg at 06/10/19 [id number] ALLERGIES   Antihistamines, chlorpheniramine-type; Codeine; and Paxil  [paroxetine]    REVIEW OF SYSTEMS     Unable to obtain due to mechanical ventilation  PHYSICAL EXAMINATION   Vital Signs: Temp:  [98.2 F (36.8 C)-99.8 F (37.7 C)] 98.2 F (36.8 C) (02/08 0800) Pulse Rate:  [72-101] 79 (02/08 0800) Resp:  [20-35] 20 (02/08 0800) BP: (98-172)/(50-97) 160/72 (02/08 0800) SpO2:  [88 %-98 %] 96 % (02/08 0843) FiO2 (%):  [32 %-40 %] 32 % (02/08 0843) Weight:  [66.7 kg] 66.7 kg (02/08 0355)  GENERAL: Chronically ill-appearing HEAD: Normocephalic, atraumatic.  EYES: Pupils equal, round, reactive to light.  No scleral icterus.  MOUTH: Moist mucosal membrane. NECK: Supple. No thyromegaly. No nodules. No JVD.  PULMONARY: Bibasilar crackles without wheezing CARDIOVASCULAR: S1 and S2. Regular rate and rhythm. No murmurs, rubs, or gallops.  GASTROINTESTINAL: Soft, nontender, non-distended. No masses. Positive bowel sounds. No hepatosplenomegaly.  MUSCULOSKELETAL: No swelling, clubbing, or edema.  NEUROLOGIC: Intubated on mechanical ventilation with GCS of 4 SKIN:intact,warm,dry   PERTINENT DATA     Infusions: . sodium chloride    . cefTAZidime (  FORTAZ)  IV 0.5 g (06/09/19 1725)  . dexmedetomidine (PRECEDEX) IV infusion 0.4 mcg/kg/hr (06/10/19 0625)  . fentaNYL infusion INTRAVENOUS 250 mcg/hr (06/10/19 0625)  . furosemide (LASIX) infusion 10 mg/hr (06/10/19 0992)   Scheduled Medications: . aspirin  81 mg Per Tube Daily  . atorvastatin  20 mg Per Tube q1800  . brimonidine  1 drop Both Eyes BID  . carvedilol  12.5 mg Per Tube BID  . chlorhexidine gluconate (MEDLINE KIT)  15 mL Mouth Rinse BID  . Chlorhexidine Gluconate Cloth  6 each Topical Daily  . docusate  100 mg Per Tube BID  . dorzolamide-timolol  1 drop Both Eyes BID  . epoetin (EPOGEN/PROCRIT) injection  10,000 Units Subcutaneous Weekly  . famotidine  20 mg Per Tube QHS  . insulin aspart  0-9 Units Subcutaneous Q4H  . ipratropium-albuterol  3 mL Nebulization QID  . latanoprost  1  drop Left Eye QHS  . mouth rinse  15 mL Mouth Rinse 10 times per day  . olopatadine  1 drop Both Eyes BID  . sertraline  25 mg Per Tube Daily  . sodium chloride flush  3 mL Intravenous Q12H  . vitamin B-12  1,000 mcg Per Tube Daily   PRN Medications: sodium chloride, acetaminophen, bisacodyl, hydrALAZINE, ondansetron (ZOFRAN) IV, sodium chloride flush Hemodynamic parameters:   Intake/Output: 02/07 0701 - 02/08 0700 In: 1711.9 [I.V.:754.3; NG/GT:95; IV Piggyback:862.7] Out: 7800 [SYPZX:8063]  Ventilator  Settings: Vent Mode: PSV FiO2 (%):  [32 %-40 %] 32 % Set Rate:  [20 bmp] 20 bmp Vt Set:  [500 mL] 500 mL PEEP:  [5 cmH20] 5 cmH20 Pressure Support:  [5 cmH20] 5 cmH20    LAB RESULTS:  Basic Metabolic Panel: Recent Labs  Lab 06/06/19 0913 06/06/19 2023 06/07/19 0612 06/07/19 0612 06/07/19 8685 06/07/19 4883 06/08/19 0326 06/08/19 0326 06/09/19 0448 06/10/19 0424  NA  --    < > 139  --  138  --  139  --  139 137  K  --    < > 4.2   < > 4.2   < > 3.7   < > 3.3* 3.7  CL  --    < > 103  --  103  --  104  --  105 104  CO2  --    < > 25  --  26  --  24  --  22 21*  GLUCOSE  --    < > 111*  --  109*  --  113*  --  116* 114*  BUN  --    < > 63*  --  66*  --  65*  --  66* 71*  CREATININE  --    < > 3.81*  --  3.95*  --  3.75*  --  3.88* 4.00*  CALCIUM  --    < > 9.5  --  9.4  --  9.3  --  9.1 8.9  MG 2.5*  --   --   --  2.6*  --  2.4  --   --  2.2  PHOS  --   --  4.3  --   --   --   --   --   --  4.7*   < > = values in this interval not displayed.   Liver Function Tests: Recent Labs  Lab 06/05/19 0102 06/06/19 2023 06/07/19 0612  AST 26 26  --   ALT 19 23  --  ALKPHOS 69 68  --   BILITOT 0.6 0.6  --   PROT 6.7 6.3*  --   ALBUMIN 3.6 3.5 3.3*   No results for input(s): LIPASE, AMYLASE in the last 168 hours. No results for input(s): AMMONIA in the last 168 hours. CBC: Recent Labs  Lab 06/05/19 0647 06/05/19 0647 06/06/19 0913 06/06/19 2023 06/07/19 0629  06/08/19 0326 06/09/19 0448  WBC 5.3   < > 5.5 6.0 6.5 6.9 9.1  NEUTROABS 4.3  --   --  5.2  --   --   --   HGB 7.1*   < > 7.5* 8.6* 8.0* 8.8* 8.9*  HCT 23.6*   < > 25.4* 29.0* 25.5* 26.6* 26.9*  MCV 88.7   < > 89.4 88.4 86.4 82.4 81.0  PLT 210   < > 246 267 243 263 250   < > = values in this interval not displayed.   Cardiac Enzymes: No results for input(s): CKTOTAL, CKMB, CKMBINDEX, TROPONINI in the last 168 hours. BNP: Invalid input(s): POCBNP CBG: Recent Labs  Lab 06/09/19 1556 06/09/19 1924 06/09/19 2357 06/10/19 0346 06/10/19 0742  GLUCAP 135* 143* 122* 102* 117*     IMAGING RESULTS:  Imaging: DG Chest Port 1 View  Result Date: 06/09/2019 CLINICAL DATA:  Pulmonary disease. EXAM: PORTABLE CHEST 1 VIEW COMPARISON:  June 07, 2019. FINDINGS: Stable cardiomediastinal silhouette. Endotracheal and nasogastric tubes are unchanged in position. Right internal jugular catheter is unchanged. No pneumothorax is noted. Mild central pulmonary vascular congestion is noted. Increased bibasilar atelectasis or edema is noted with small pleural effusions. Bony thorax is unremarkable. IMPRESSION: Stable support apparatus. Increased bibasilar atelectasis or edema is noted with small pleural effusions. Electronically Signed   By: Marijo Conception M.D.   On: 06/09/2019 10:47      ASSESSMENT AND PLAN    -Multidisciplinary rounds held today  Severe acute Hypoxic Respiratory Failure -Due to acute on chronic decompensated diastolic CHF -extubated 12/01/42 -cannot rule out infectious etiology - tracheal aspirate with GPC growing  Empiric abx with ceftaz/doxy -Lasix GTT -Strict I's and O's with Foley catheter -continue Full MV support -continue Bronchodilator Therapy -Wean Fio2 and PEEP as tolerated -will perform SAT/SBT when respiratory parameters are met Net -4.7L -[place] at bedside with minimal pleural effusion  Status post cardiac arrest likely due to respiratory failure with  hypoxemia -Cardiology on case appreciate input -Lasix drip currently ICU telemetry monitoring  Renal Failure acute on chronic stage V with proteinuria Nephrology on case-appreciate input -Possible EPO and HD -Plan for subclavian port insertion with vascular surgery -follow chem 7 -follow UO -continue Foley Catheter-assess need daily    ID -continue IV abx as prescibed -follow up cultures  GI/Nutrition GI PROPHYLAXIS as indicated DIET-->TF's as tolerated Constipation protocol as indicated  ENDO - ICU hypoglycemic\Hyperglycemia protocol -check FSBS per protocol   ELECTROLYTES -follow labs as needed -replace as needed -pharmacy consultation   DVT/GI PRX ordered -SCDs  TRANSFUSIONS AS NEEDED MONITOR FSBS ASSESS the need for LABS as needed   Critical care provider statement:    Critical care time (minutes):  33   Critical care time was exclusive of:  Separately billable procedures and treating other patients   Critical care was necessary to treat or prevent imminent or life-threatening deterioration of the following conditions:   Severe acute hypoxemic respiratory failure, status post cardiac arrest, acute on chronic renal failure, essential hypertension, diabetes mellitus, anemia, hyperparathyroidism, multiple comorbid conditions   Critical care was time spent  personally by me on the following activities:  Development of treatment plan with patient or surrogate, discussions with consultants, evaluation of patient's response to treatment, examination of patient, obtaining history from patient or surrogate, ordering and performing treatments and interventions, ordering and review of laboratory studies and re-evaluation of patient's condition.  I assumed direction of critical care for this patient from another provider in my specialty: no    This document was prepared using Dragon voice recognition software and may include unintentional dictation errors.    Ottie Glazier, M.D.  Division of [place]

## 2019-06-10 NOTE — Progress Notes (Signed)
[place], [place] 06/10/19  Subjective:   Hospital day # 5 Patient remains critically ill, intubated sedated  Eyes open. Following commands this morning   cvs: Currently on furosemide drip, responding well pulm: Ventilator dependent, FiO2 30%   gi: OG tube present Renal: Foley in place.  Good urine output 02/07 0701 - 02/08 0700 In: 1711.9 [I.V.:754.3; NG/GT:95; IV Piggyback:862.7] Out: 1600 [Urine:1600] Lab Results  Component Value Date   CREATININE 4.00 (H) 06/10/2019   CREATININE 3.88 (H) 06/09/2019   CREATININE 3.75 (H) 06/08/2019     Objective:  Vital signs in last 24 hours:  Temp:  [98.2 F (36.8 C)-99.8 F (37.7 C)] 98.2 F (36.8 C) (02/08 0800) Pulse Rate:  [72-101] 79 (02/08 0800) Resp:  [20-35] 20 (02/08 0800) BP: (98-172)/(50-97) 160/72 (02/08 0800) SpO2:  [88 %-98 %] 96 % (02/08 0843) FiO2 (%):  [32 %-40 %] 32 % (02/08 0843) Weight:  [66.7 kg] 66.7 kg (02/08 0355)  Weight change: -1.9 kg Filed Weights   06/08/19 0346 06/09/19 0358 06/10/19 0355  Weight: 67.3 kg 68.6 kg 66.7 kg    Intake/Output:    Intake/Output Summary (Last 24 hours) at 06/10/2019 1042 Last data filed at 06/10/2019 0900 Gross per 24 hour  Intake 1711.92 ml  Output 1685 ml  Net 26.92 ml     Physical Exam: General:  Critically ill-appearing, laying in the bed  HEENT  ET tube, OG tube  Pulm/lungs  ventilator assisted  CVS/Heart  regular, no rub  Abdomen:   Soft  Extremities:  Peripheral edema present  Neurologic:  able to follow simple commands  Skin:  Warm  Access:  Right IJ PermCath       Basic Metabolic Panel:  Recent Labs  Lab 06/06/19 0913 06/06/19 2023 06/07/19 0612 06/07/19 0612 06/07/19 2536 06/07/19 6440 06/08/19 0326 06/09/19 0448 06/10/19 0424  NA  --    < > 139  --  138  --  139 139 137  K  --    < > 4.2  --  4.2  --  3.7 3.3* 3.7  CL  --    < > 103  --  103  --  104 105 104  CO2  --    < > 25  --  26  --  24 22 21*   GLUCOSE  --    < > 111*  --  109*  --  113* 116* 114*  BUN  --    < > 63*  --  66*  --  65* 66* 71*  CREATININE  --    < > 3.81*  --  3.95*  --  3.75* 3.88* 4.00*  CALCIUM  --    < > 9.5   < > 9.4   < > 9.3 9.1 8.9  MG 2.5*  --   --   --  2.6*  --  2.4  --  2.2  PHOS  --   --  4.3  --   --   --   --   --  4.7*   < > = values in this interval not displayed.     CBC: Recent Labs  Lab 06/05/19 0647 06/05/19 0647 06/06/19 0913 06/06/19 2023 06/07/19 0629 06/08/19 0326 06/09/19 0448  WBC 5.3   < > 5.5 6.0 6.5 6.9 9.1  NEUTROABS 4.3  --   --  5.2  --   --   --   HGB 7.1*   < >  7.5* 8.6* 8.0* 8.8* 8.9*  HCT 23.6*   < > 25.4* 29.0* 25.5* 26.6* 26.9*  MCV 88.7   < > 89.4 88.4 86.4 82.4 81.0  PLT 210   < > 246 267 243 263 250   < > = values in this interval not displayed.      Lab Results  Component Value Date   HEPBSAG NON REACTIVE 06/07/2019   HEPBSAB NON REACTIVE 06/07/2019   HEPBIGM NON REACTIVE 06/07/2019      Microbiology:  Recent Results (from the past 240 hour(s))  Respiratory Panel by RT PCR (Flu A&B, Covid) - Nasopharyngeal Swab     Status: None   Collection Time: 06/05/19  2:27 AM   Specimen: Nasopharyngeal Swab  Result Value Ref Range Status   SARS Coronavirus 2 by RT PCR NEGATIVE NEGATIVE Final    Comment: (NOTE) SARS-CoV-2 target nucleic acids are NOT DETECTED. The SARS-CoV-2 RNA is generally detectable in upper respiratoy specimens during the acute phase of infection. The lowest concentration of SARS-CoV-2 viral copies this assay can detect is 131 copies/mL. A negative result does not preclude SARS-Cov-2 infection and should not be used as the sole basis for treatment or other patient management decisions. A negative result may occur with  improper specimen collection/handling, submission of specimen other than nasopharyngeal swab, presence of viral mutation(s) within the areas targeted by this assay, and inadequate number of viral copies (<131  copies/mL). A negative result must be combined with clinical observations, patient history, and epidemiological information. The expected result is Negative. Fact Sheet for Patients:  PinkCheek.be Fact Sheet for Healthcare Providers:  GravelBags.it This test is not yet ap proved or cleared by the [place] FDA and  has been authorized for detection and/or diagnosis of SARS-CoV-2 by FDA under an Emergency Use Authorization (EUA). This EUA will remain  in effect (meaning this test can be used) for the duration of the COVID-19 declaration under Section 564(b)(1) of the Act, 21 U.S.C. section 360bbb-3(b)(1), unless the authorization is terminated or revoked sooner.    Influenza A by PCR NEGATIVE NEGATIVE Final   Influenza B by PCR NEGATIVE NEGATIVE Final    Comment: (NOTE) The Xpert Xpress SARS-CoV-2/FLU/RSV assay is intended as an aid in  the diagnosis of influenza from Nasopharyngeal swab specimens and  should not be used as a sole basis for treatment. Nasal washings and  aspirates are unacceptable for Xpert Xpress SARS-CoV-2/FLU/RSV  testing. Fact Sheet for Patients: PinkCheek.be Fact Sheet for Healthcare Providers: GravelBags.it This test is not yet approved or cleared by the [place] FDA and  has been authorized for detection and/or diagnosis of SARS-CoV-2 by  FDA under an Emergency Use Authorization (EUA). This EUA will remain  in effect (meaning this test can be used) for the duration of the  Covid-19 declaration under Section 564(b)(1) of the Act, 21  U.S.C. section 360bbb-3(b)(1), unless the authorization is  terminated or revoked. Performed at [facility], [address] [place], [place] [address]   MRSA PCR Screening     Status: None   Collection Time: 06/07/19 12:53 AM   Specimen: Nasopharyngeal  Result Value Ref Range Status   MRSA by  PCR NEGATIVE NEGATIVE Final    Comment:        The GeneXpert MRSA Assay (FDA approved for NASAL specimens only), is one component of a comprehensive MRSA colonization surveillance program. It is not intended to diagnose MRSA infection nor to guide or monitor treatment for MRSA infections. Performed  at [facility], [address] [place], [place] [address]   Culture, respiratory (non-expectorated)     Status: None (Preliminary result)   Collection Time: 06/09/19 10:11 AM   Specimen: Tracheal Aspirate; Respiratory  Result Value Ref Range Status   Specimen Description   Final    TRACHEAL ASPIRATE Performed at [facility], [address] [place], [place] [address]    Special Requests   Final    NONE Performed at [facility], [place], [place] [address]    Gram Stain   Final    MODERATE WBC PRESENT, PREDOMINANTLY PMN MODERATE GRAM POSITIVE COCCI IN PAIRS IN CHAINS FEW GRAM VARIABLE ROD Performed at [facility], [place] [address], [place], [place] [address]    Culture PENDING  Incomplete   Report Status PENDING  Incomplete    Coagulation Studies: Recent Labs    06/07/19 1105  LABPROT 14.9  INR 1.2    Urinalysis: No results for input(s): COLORURINE, LABSPEC, PHURINE, GLUCOSEU, HGBUR, BILIRUBINUR, KETONESUR, PROTEINUR, UROBILINOGEN, NITRITE, LEUKOCYTESUR in the last 72 hours.  Invalid input(s): APPERANCEUR    Imaging: DG Chest Port 1 View  Result Date: 06/09/2019 CLINICAL DATA:  Pulmonary disease. EXAM: PORTABLE CHEST 1 VIEW COMPARISON:  June 07, 2019. FINDINGS: Stable cardiomediastinal silhouette. Endotracheal and nasogastric tubes are unchanged in position. Right internal jugular catheter is unchanged. No pneumothorax is noted. Mild central pulmonary vascular congestion is noted. Increased bibasilar atelectasis or edema is noted with small pleural effusions. Bony thorax is unremarkable. IMPRESSION: Stable support  apparatus. Increased bibasilar atelectasis or edema is noted with small pleural effusions. Electronically Signed   By: Marijo Conception M.D.   On: 06/09/2019 10:47     Medications:   . sodium chloride    . cefTAZidime (FORTAZ)  IV 0.5 g (06/09/19 1725)  . dexmedetomidine (PRECEDEX) IV infusion 0.4 mcg/kg/hr (06/10/19 0625)  . fentaNYL infusion INTRAVENOUS 250 mcg/hr (06/10/19 0625)  . furosemide (LASIX) infusion 10 mg/hr (06/10/19 0625)   . aspirin  81 mg Per Tube Daily  . atorvastatin  20 mg Per Tube q1800  . brimonidine  1 drop Both Eyes BID  . carvedilol  12.5 mg Per Tube BID  . chlorhexidine gluconate (MEDLINE KIT)  15 mL Mouth Rinse BID  . Chlorhexidine Gluconate Cloth  6 each Topical Daily  . docusate  100 mg Per Tube BID  . dorzolamide-timolol  1 drop Both Eyes BID  . epoetin (EPOGEN/PROCRIT) injection  10,000 Units Subcutaneous Weekly  . famotidine  20 mg Per Tube QHS  . insulin aspart  0-9 Units Subcutaneous Q4H  . ipratropium-albuterol  3 mL Nebulization QID  . latanoprost  1 drop Left Eye QHS  . mouth rinse  15 mL Mouth Rinse 10 times per day  . olopatadine  1 drop Both Eyes BID  . sertraline  25 mg Per Tube Daily  . sodium chloride flush  3 mL Intravenous Q12H  . vitamin B-12  1,000 mcg Per Tube Daily   sodium chloride, acetaminophen, bisacodyl, hydrALAZINE, ondansetron (ZOFRAN) IV, sodium chloride flush  Assessment/ Plan:  86 y.o.African [place] female with diabetes mellitus type II, hypertension, anemia, GERD, depression, anxiety, glaucoma, hyperlipidemia, diastolic congestive heart failure, coronary artery disease admitted on 06/05/2019 for SOB (shortness of breath) [R06.02] Acute CHF (congestive heart failure) ([place]) [I50.9] Acute respiratory failure with hypoxia (HCC) [J96.01] Heart failure (HCC) [I50.9] Acute on chronic congestive heart failure, unspecified heart failure type ([place]) [I50.9]   1.  ARF on CKD st 5  Chronic kidney disease is likely secondary to  diabetic nephropathy and hypertensive nephrosclerosis.  This admission, patient presented via EMS from home for respiratory distress, shortness of breath, worsening lower extremity edema.  She has failed outpatient volume management. Dialysis catheter was placed this admission Overall patient's clinical condition has improved.   We will defer dialysis to Tuesday Creatinine 4/GFR 11  2.  Acute respiratory failure Ventilator assisted Hospital course complicated by respiratory arrest the night of February 4  Currently on furosemide infusion, responding well with good urine output Volume status appears to have optimized  3. Diabetes type 2 with CKD Lab Results  Component Value Date   HGBA1C 6.0 (H) 04/10/2019   4. Anemia of CKD -  Lab Results  Component Value Date   HGB 8.9 (L) 06/09/2019   EPO SQ, saturday  5. [facility] Lab Results  Component Value Date   PTH 148 (H) 06/07/2019   CALCIUM 8.9 06/10/2019   PHOS 4.7 (H) 06/10/2019        LOS: [place] 2/8/202110:42 AM  [place], [place]  Note: This note was prepared with Dragon dictation. Any transcription errors are unintentional

## 2019-06-10 NOTE — Progress Notes (Addendum)
[place] visited pt. while rounding on ICU; pt. initially intubated but awake and somewhat anxious, acc. to RN.  Ch visited pt but had difficulty conversing due to breathing tube.  [place] returned a short time later post-extubation; pt. still having difficulty talking but asked [place] to check to see if her niece was coming.  CH confirmed w/RN --niece designated visitor and coming later today.  [place] made pt. aware of this and of CH's availability.  No further needs expressed at this time.        06/10/19 1245  Clinical Encounter Type  Visited With Patient;Health care provider  Visit Type Initial;Critical Care  Referral From Other (Comment) (RT)  Spiritual Encounters  Spiritual Needs Emotional  Stress Factors  Patient Stress Factors Loss of control;Major life changes;Health changes

## 2019-06-11 ENCOUNTER — Inpatient Hospital Stay: Payer: Medicare Other

## 2019-06-11 DIAGNOSIS — N186 End stage renal disease: Secondary | ICD-10-CM

## 2019-06-11 LAB — BASIC METABOLIC PANEL
Anion gap: 13 (ref 5–15)
BUN: 79 mg/dL — ABNORMAL HIGH (ref 8–23)
CO2: 22 mmol/L (ref 22–32)
Calcium: 9.3 mg/dL (ref 8.9–10.3)
Chloride: 107 mmol/L (ref 98–111)
Creatinine, Ser: 4.04 mg/dL — ABNORMAL HIGH (ref 0.44–1.00)
GFR calc Af Amer: 11 mL/min — ABNORMAL LOW (ref >60)
GFR calc non Af Amer: 9 mL/min — ABNORMAL LOW (ref >60)
Glucose, Bld: 101 mg/dL — ABNORMAL HIGH (ref 70–99)
Potassium: 3.8 mmol/L (ref 3.5–5.1)
Sodium: 142 mmol/L (ref 135–145)

## 2019-06-11 LAB — GLUCOSE, CAPILLARY
Glucose-Capillary: 101 mg/dL — ABNORMAL HIGH (ref 70–99)
Glucose-Capillary: 105 mg/dL — ABNORMAL HIGH (ref 70–99)
Glucose-Capillary: 126 mg/dL — ABNORMAL HIGH (ref 70–99)
Glucose-Capillary: 145 mg/dL — ABNORMAL HIGH (ref 70–99)
Glucose-Capillary: 192 mg/dL — ABNORMAL HIGH (ref 70–99)
Glucose-Capillary: 35 mg/dL — CL (ref 70–99)
Glucose-Capillary: 92 mg/dL (ref 70–99)
Glucose-Capillary: 98 mg/dL (ref 70–99)

## 2019-06-11 LAB — OCCULT BLOOD X 1 CARD TO LAB, STOOL: Fecal Occult Bld: NEGATIVE

## 2019-06-11 LAB — CBC
HCT: 25.4 % — ABNORMAL LOW (ref 36.0–46.0)
Hemoglobin: 7.8 g/dL — ABNORMAL LOW (ref 12.0–15.0)
MCH: 26.4 pg (ref 26.0–34.0)
MCHC: 30.7 g/dL (ref 30.0–36.0)
MCV: 86.1 fL (ref 80.0–100.0)
Platelets: 249 10*3/uL (ref 150–400)
RBC: 2.95 MIL/uL — ABNORMAL LOW (ref 3.87–5.11)
RDW: 14.6 % (ref 11.5–15.5)
WBC: 11.7 10*3/uL — ABNORMAL HIGH (ref 4.0–10.5)
nRBC: 0 % (ref 0.0–0.2)

## 2019-06-11 LAB — FERRITIN: Ferritin: 169 ng/mL (ref 11–307)

## 2019-06-11 MED ORDER — CYANOCOBALAMIN 1000 MCG/ML IJ SOLN
1000.0000 ug | Freq: Every day | INTRAMUSCULAR | Status: AC
  Administered 2019-06-11 – 2019-06-13 (×3): 1000 ug via INTRAMUSCULAR
  Filled 2019-06-11 (×4): qty 1

## 2019-06-11 MED ORDER — INSULIN ASPART 100 UNIT/ML ~~LOC~~ SOLN
0.0000 [IU] | Freq: Every day | SUBCUTANEOUS | Status: DC
  Administered 2019-06-12: 22:00:00 3 [IU] via SUBCUTANEOUS
  Filled 2019-06-11: qty 1

## 2019-06-11 MED ORDER — POTASSIUM CHLORIDE CRYS ER 20 MEQ PO TBCR
20.0000 meq | EXTENDED_RELEASE_TABLET | Freq: Once | ORAL | Status: DC
  Filled 2019-06-11: qty 1

## 2019-06-11 MED ORDER — VITAMIN B-12 1000 MCG PO TABS: 1000.0000 ug | ORAL_TABLET | Freq: Every day | ORAL | Status: DC

## 2019-06-11 MED ORDER — SENNOSIDES-DOCUSATE SODIUM 8.6-50 MG PO TABS
2.0000 | ORAL_TABLET | Freq: Two times a day (BID) | ORAL | Status: DC
  Administered 2019-06-11 – 2019-06-15 (×8): 2 via ORAL
  Administered 2019-06-15: 1 via ORAL
  Administered 2019-06-16: 2 via ORAL
  Filled 2019-06-11 (×11): qty 2

## 2019-06-11 MED ORDER — ALPRAZOLAM 0.25 MG PO TABS
0.2500 mg | ORAL_TABLET | Freq: Two times a day (BID) | ORAL | Status: DC | PRN
  Administered 2019-06-11 – 2019-06-12 (×2): 0.25 mg via ORAL
  Filled 2019-06-11 (×3): qty 1

## 2019-06-11 MED ORDER — INSULIN ASPART 100 UNIT/ML ~~LOC~~ SOLN
0.0000 [IU] | Freq: Three times a day (TID) | SUBCUTANEOUS | Status: DC
  Administered 2019-06-11: 17:00:00 1 [IU] via SUBCUTANEOUS
  Administered 2019-06-12: 4 [IU] via SUBCUTANEOUS
  Administered 2019-06-14: 1 [IU] via SUBCUTANEOUS
  Filled 2019-06-11 (×4): qty 1

## 2019-06-11 MED ORDER — TRAZODONE HCL 50 MG PO TABS
50.0000 mg | ORAL_TABLET | Freq: Every evening | ORAL | Status: DC | PRN
  Administered 2019-06-12 – 2019-06-16 (×6): 50 mg via ORAL
  Filled 2019-06-11 (×6): qty 1

## 2019-06-11 MED ORDER — ORAL CARE MOUTH RINSE
15.0000 mL | Freq: Two times a day (BID) | OROMUCOSAL | Status: DC
  Administered 2019-06-11 – 2019-06-16 (×7): 15 mL via OROMUCOSAL

## 2019-06-11 NOTE — Progress Notes (Signed)
Pre HD Tx Assessment   06/11/19 1710  Neurological  Level of Consciousness Alert  Orientation Level Oriented X4  Respiratory  Respiratory Pattern Regular;Unlabored  Chest Assessment Chest expansion symmetrical  Bilateral Breath Sounds Clear;Diminished  Cardiac  Pulse Regular  Heart Sounds S1, S2  Jugular Venous Distention (JVD) No  ECG Monitor Yes  Cardiac Rhythm NSR  Ectopy Unifocal PVC's  Ectopy Frequency Occasional  Vascular  R Radial Pulse +2  L Radial Pulse +2  R Dorsalis Pedis Pulse +1  L Dorsalis Pedis Pulse +1  Edema Other (Comment) (none)  Integumentary  Integumentary (WDL) X  Skin Color Appropriate for ethnicity  Skin Condition Dry  Skin Integrity Skin tear  Musculoskeletal  Musculoskeletal (WDL) X  Generalized Weakness Yes  Gastrointestinal  Bowel Sounds Assessment Active  Last BM Date  (Pt doesn't remember prior too admission)  GU Assessment  Genitourinary (WDL) X  Genitourinary Symptoms Urinary Catheter  Psychosocial  Psychosocial (WDL) WDL

## 2019-06-11 NOTE — Progress Notes (Addendum)
Patient ID: MALLARIE GLOGOWSKI, female   DOB: 07-20-1931, 86 y.o.   MRN: [id number] [facility] PROGRESS NOTE  ALONNAH WOODROW [id number] DOB: Dec 26, 1931 DOA: 06/05/2019 PCP: Jerrol Banana., MD  HPI/Subjective: Patient feels okay.  No complaints of chest pain.  Some shortness of breath.  Admitted on 06/05/2019 with heart failure and respiratory failure.  Objective: Vitals:   06/11/19 1300 06/11/19 1400  BP: (!) 145/62 (!) 129/52  Pulse: 83 76  Resp: 19 17  Temp:    SpO2: 95% 98%    Intake/Output Summary (Last 24 hours) at 06/11/2019 1528 Last data filed at 06/11/2019 1300 Gross per 24 hour  Intake 292.27 ml  Output 1790 ml  Net -1497.73 ml   Filed Weights   06/09/19 0358 06/10/19 0355 06/11/19 0500  Weight: 68.6 kg 66.7 kg 61.9 kg    ROS: Review of Systems  Constitutional: Negative for chills and fever.  Eyes: Negative for blurred vision.  Respiratory: Positive for shortness of breath. Negative for cough.   Cardiovascular: Negative for chest pain.  Gastrointestinal: Negative for abdominal pain, constipation, diarrhea, nausea and vomiting.  Genitourinary: Negative for dysuria.  Musculoskeletal: Negative for joint pain.  Neurological: Negative for dizziness and headaches.   Exam: Physical Exam  HENT:  Nose: No mucosal edema.  Mouth/Throat: No oropharyngeal exudate or posterior oropharyngeal edema.  Eyes: Conjunctivae and lids are normal.  Neck: Carotid bruit is not present.  Cardiovascular: S1 normal and S2 normal. Exam reveals no gallop.  No murmur heard. Respiratory: No respiratory distress. She has decreased breath sounds in the right lower field and the left lower field. She has no wheezes. She has no rhonchi. She has rales in the right lower field and the left lower field.  GI: Soft. Bowel sounds are normal. There is no abdominal tenderness.  Musculoskeletal:     Right ankle: No swelling.     Left ankle: No swelling.  Lymphadenopathy:    She has  no cervical adenopathy.  Neurological: She is alert. No cranial nerve deficit.  Able to straight leg raise bilaterally  Skin: Skin is warm. Nails show no clubbing.  Some bruising on the arms  Psychiatric: She has a normal mood and affect.      Data Reviewed: Basic Metabolic Panel: Recent Labs  Lab 06/06/19 0913 06/06/19 2023 06/07/19 [id number] 06/07/19 [id number] 06/07/19 [id number] 06/08/19 [id number] 06/09/19 0448 06/10/19 0424 06/11/19 0434  NA  --    < > 139   < > 138 139 139 137 142  K  --    < > 4.2   < > 4.2 3.7 3.3* 3.7 3.8  CL  --    < > 103   < > 103 104 105 104 107  CO2  --    < > 25   < > 26 24 22  21* 22  GLUCOSE  --    < > 111*   < > 109* 113* 116* 114* 101*  BUN  --    < > 63*   < > 66* 65* 66* 71* 79*  CREATININE  --    < > 3.81*   < > 3.95* 3.75* 3.88* 4.00* 4.04*  CALCIUM  --    < > 9.5   < > 9.4 9.3 9.1 8.9 9.3  MG 2.5*  --   --   --  2.6* 2.4  --  2.2  --   PHOS  --   --  4.3  --   --   --   --  4.7*  --    < > = values in this interval not displayed.   Liver Function Tests: Recent Labs  Lab 06/05/19 0102 06/06/19 2023 06/07/19 0612  AST 26 26  --   ALT 19 23  --   ALKPHOS 69 68  --   BILITOT 0.6 0.6  --   PROT 6.7 6.3*  --   ALBUMIN 3.6 3.5 3.3*  CBC: Recent Labs  Lab 06/05/19 0647 06/06/19 0913 06/06/19 2023 06/07/19 0629 06/08/19 0326 06/09/19 0448 06/11/19 0434  WBC 5.3   < > 6.0 6.5 6.9 9.1 11.7*  NEUTROABS 4.3  --  5.2  --   --   --   --   HGB 7.1*   < > 8.6* 8.0* 8.8* 8.9* 7.8*  HCT 23.6*   < > 29.0* 25.5* 26.6* 26.9* 25.4*  MCV 88.7   < > 88.4 86.4 82.4 81.0 86.1  PLT 210   < > 267 243 263 250 249   < > = values in this interval not displayed.   BNP (last 3 results) Recent Labs    06/05/19 0102 06/06/19 2023  BNP 577.0* 549.0*     CBG: Recent Labs  Lab 06/10/19 1953 06/11/19 0036 06/11/19 0446 06/11/19 0723 06/11/19 1129  GLUCAP 95 92 98 101* 105*    Recent Results (from the past 240 hour(s))  Respiratory Panel by RT PCR (Flu  A&B, Covid) - Nasopharyngeal Swab     Status: None   Collection Time: 06/05/19  2:27 AM   Specimen: Nasopharyngeal Swab  Result Value Ref Range Status   SARS Coronavirus 2 by RT PCR NEGATIVE NEGATIVE Final    Comment: (NOTE) SARS-CoV-2 target nucleic acids are NOT DETECTED. The SARS-CoV-2 RNA is generally detectable in upper respiratoy specimens during the acute phase of infection. The lowest concentration of SARS-CoV-2 viral copies this assay can detect is 131 copies/mL. A negative result does not preclude SARS-Cov-2 infection and should not be used as the sole basis for treatment or other patient management decisions. A negative result may occur with  improper specimen collection/handling, submission of specimen other than nasopharyngeal swab, presence of viral mutation(s) within the areas targeted by this assay, and inadequate number of viral copies (<131 copies/mL). A negative result must be combined with clinical observations, patient history, and epidemiological information. The expected result is Negative. Fact Sheet for Patients:  PinkCheek.be Fact Sheet for Healthcare Providers:  GravelBags.it This test is not yet ap proved or cleared by the [place] FDA and  has been authorized for detection and/or diagnosis of SARS-CoV-2 by FDA under an Emergency Use Authorization (EUA). This EUA will remain  in effect (meaning this test can be used) for the duration of the COVID-19 declaration under Section 564(b)(1) of the Act, 21 U.S.C. section 360bbb-3(b)(1), unless the authorization is terminated or revoked sooner.    Influenza A by PCR NEGATIVE NEGATIVE Final   Influenza B by PCR NEGATIVE NEGATIVE Final    Comment: (NOTE) The Xpert Xpress SARS-CoV-2/FLU/RSV assay is intended as an aid in  the diagnosis of influenza from Nasopharyngeal swab specimens and  should not be used as a sole basis for treatment. Nasal washings  and  aspirates are unacceptable for Xpert Xpress SARS-CoV-2/FLU/RSV  testing. Fact Sheet for Patients: PinkCheek.be Fact Sheet for Healthcare Providers: GravelBags.it This test is not yet approved or cleared by the [place] FDA and  has been authorized for detection and/or diagnosis of SARS-CoV-2 by  FDA under an Emergency  Use Authorization (EUA). This EUA will remain  in effect (meaning this test can be used) for the duration of the  Covid-19 declaration under Section 564(b)(1) of the Act, 21  U.S.C. section 360bbb-3(b)(1), unless the authorization is  terminated or revoked. Performed at [facility], [address] [place], [place] [address]   MRSA PCR Screening     Status: None   Collection Time: 06/07/19 12:53 AM   Specimen: Nasopharyngeal  Result Value Ref Range Status   MRSA by PCR NEGATIVE NEGATIVE Final    Comment:        The GeneXpert MRSA Assay (FDA approved for NASAL specimens only), is one component of a comprehensive MRSA colonization surveillance program. It is not intended to diagnose MRSA infection nor to guide or monitor treatment for MRSA infections. Performed at [facility], [address] [place], [place] [address]   Culture, respiratory (non-expectorated)     Status: None (Preliminary result)   Collection Time: 06/09/19 10:11 AM   Specimen: Tracheal Aspirate; Respiratory  Result Value Ref Range Status   Specimen Description   Final    TRACHEAL ASPIRATE Performed at [facility], [address], [place], [place] [address]    Special Requests   Final    NONE Performed at [facility], [address] [place], [place] [address]    Gram Stain   Final    MODERATE WBC PRESENT, PREDOMINANTLY PMN MODERATE GRAM POSITIVE COCCI IN PAIRS IN CHAINS FEW GRAM VARIABLE ROD    Culture   Final    FEW Consistent with normal respiratory flora. Performed at  [facility], [place] [address], [place],  [address]    Report Status PENDING  Incomplete     Studies: DG Chest Port 1 View  Result Date: 06/11/2019 CLINICAL DATA:  Shortness of breath. Acute on chronic congestive heart failure. EXAM: PORTABLE CHEST 1 VIEW COMPARISON:  Chest x-rays dated 06/09/2019 and 06/08/2019 FINDINGS: Endotracheal tube and NG tube have been removed. Double lumen central venous catheter is in place, unchanged. Heart size is normal. The pulmonary vascularity has improved. Improved aeration at the right lung base. There is a persistent small left effusion and atelectasis at the left base with minimal residual atelectasis on the right. IMPRESSION: Improving pulmonary vascular congestion. Improved aeration at the right lung base. Aortic Atherosclerosis (ICD10-I70.0). Electronically Signed   By: Lorriane Shire M.D.   On: 06/11/2019 11:00    Scheduled Meds: . aspirin  81 mg Oral Daily  . atorvastatin  20 mg Oral q1800  . brimonidine  1 drop Both Eyes BID  . carvedilol  12.5 mg Oral BID  . Chlorhexidine Gluconate Cloth  6 each Topical Daily  . dorzolamide-timolol  1 drop Both Eyes BID  . epoetin (EPOGEN/PROCRIT) injection  10,000 Units Subcutaneous Weekly  . famotidine  20 mg Oral QHS  . insulin aspart  0-9 Units Subcutaneous Q4H  . ipratropium-albuterol  3 mL Nebulization QID  . latanoprost  1 drop Left Eye QHS  . mouth rinse  15 mL Mouth Rinse BID  . olopatadine  1 drop Both Eyes BID  . potassium chloride  20 mEq Oral Once  . senna-docusate  2 tablet Oral BID  . sertraline  25 mg Oral Daily  . sodium chloride flush  3 mL Intravenous Q12H  . vitamin B-12  1,000 mcg Oral Daily   Continuous Infusions: . sodium chloride    . cefTAZidime (FORTAZ)  IV Stopped (06/11/19 1010)  .  furosemide (LASIX) infusion 10 mg/hr (06/11/19 1300)    Assessment/Plan:  1. Acute hypoxic respiratory failure.  Patient was extubated 06/10/2019.  Now on high flow nasal cannula 8 L.   Patient on empiric antibiotics just in case infection. 2. End-stage renal disease as per nephrology.  Catheter placed in the right chest.  Patient on Lasix drip.  Patient will start dialysis today. 3. Acute on chronic diastolic congestive heart failure.  Patient currently on Lasix drip.  Hopefully dialysis will manage fluid in the future.  Patient on low-dose Coreg. 4. Type 2 diabetes mellitus with hyperlipidemia on Lipitor.  Sliding scale insulin 5. Glaucoma unspecified on eyedrops 6. Essential hypertension.  On low-dose Coreg 7. Depression on Zoloft 8. Anemia of chronic disease.  Continue to monitor closely 9. Vitamin B12 also very low we will give IM B12 shots for 3 days.  Code Status:     Code Status Orders  (From admission, onward)         Start     Ordered   06/05/19 0337  Full code  Continuous     06/05/19 0340        Code Status History    Date Active Date Inactive Code Status Order ID Comments User Context   04/10/2017 0140 04/13/2017 2256 Full Code [id number]  SalaryAvel Peace, MD Inpatient   Advance Care Planning Activity    Advance Directive Documentation     Most Recent Value  Type of Advance Directive  Living will  Pre-existing out of facility DNR order (yellow form or pink MOST form)  --  "MOST" Form in Place?  --     Family Communication: Spoke with niece on the phone Disposition Plan: Patient still on quite a bit of oxygen at this point high flow nasal cannula.  Respiratory status will need to be better prior to any disposition.  Dialysis initiation started on a new patient and will need outpatient dialysis slot and 3 dialysis sessions here in the hospital at least.  Consultants:  Critical care specialist  Cardiology  Nephrology  Vascular surgery  Procedures:  Right PermCath  Antibiotics:  Maxipime  Time spent: 28 minute, case discussed with nephrologist  [place]

## 2019-06-11 NOTE — Evaluation (Signed)
Clinical/Bedside Swallow Evaluation Patient Details  Name: Lori Stanley MRN: [id number] Date of Birth: Jul 20, 1931  Today's Date: 06/11/2019 Time: SLP Start Time (ACUTE ONLY): 37 SLP Stop Time (ACUTE ONLY): 1336 SLP Time Calculation (min) (ACUTE ONLY): 56 min  Past Medical History:  Past Medical History:  Diagnosis Date  . Acute heart failure ([place])   . Chronic low back pain   . Diabetes mellitus without complication ([place])   . Hyperlipidemia   . Hypertension   . MI (myocardial infarction) ([place])   . Obesity    Past Surgical History:  Past Surgical History:  Procedure Laterality Date  . DIALYSIS/PERMA CATHETER INSERTION N/A 06/07/2019   Procedure: DIALYSIS/PERMA CATHETER INSERTION;  Surgeon: Katha Cabal, MD;  Location: [facility];  Service: Cardiovascular;  Laterality: N/A;  . EYE SURGERY    . KNEE SURGERY    . THYROID SURGERY    . VAGINAL HYSTERECTOMY     HPI: Per admitting history and Physical:    Lori Stanley  is a 86 y.o. female with a known history of CHF, hypertension, dyslipidemia and coronary artery disease, presented to the emergency room with an onset of worsening dyspnea over the last few days with associated dry cough as well as worsening lower extremity edema, orthopnea and occasional paroxysmal nocturnal dyspnea.  Pt intubated 06/06/19 and transferred to the ICU. Pt extubated 06/10/19.  Assessment / Plan / Recommendation Clinical Impression  Pt presents with possible s/s of aspiration during bedside swallow eval characterized by delayed coughing. Oral mech exam revealed structures to be functioning adequately for most consistencies. Pt has no upper teeth resulting in some difficulty masticating solids. Pt tolerated applesauce, nectar thick liquids and soft solids well. No immediate s/s of aspiration noted with any tested consistency. Some delayed coughing episodes noted during bedside swallow eval, however, it was difficult to determine if they were  related to PO's. No change in oxygen saturation throughout bedside swallow eval. Rec dysphagia 2 diet with nectar thick liquids for now. Meds to be given in applesauce. Pt was noted to have low vocal intensity with breathy, hoarse voicing likely related to recent intubation. Further trials of thin liquid through ongoing assessment as overall status improves in hopes up upgrading diet soon. Prognosis good. SLP Visit Diagnosis: Dysphagia, oropharyngeal phase (R13.12)    Aspiration Risk  Mild aspiration risk    Diet Recommendation Dysphagia 2 (Fine chop);Nectar-thick liquid;Ice chips PRN after oral care   Medication Administration: Whole meds with puree Supervision: Staff to assist with self feeding Compensations: Minimize environmental distractions;Slow rate;Small sips/bites Postural Changes: Seated upright at 90 degrees;Remain upright for at least 30 minutes after po intake    Other  Recommendations   F/u with toleration of diet  Follow up Recommendations        Frequency and Duration min 2x/week  1 week       Prognosis Prognosis for Safe Diet Advancement: Good      Swallow Study   General Date of Onset: 06/06/19 Type of Study: Bedside Swallow Evaluation Diet Prior to this Study: NPO Respiratory Status: Nasal cannula History of Recent Intubation: Yes Length of Intubations (days): 4 days Date extubated: 06/10/19 Behavior/Cognition: Alert;Cooperative;Pleasant mood Oral Cavity Assessment: Within Functional Limits Oral Care Completed by SLP: No Oral Cavity - Dentition: Missing dentition Vision: (Needs assist) Self-Feeding Abilities: Needs assist Patient Positioning: Upright in bed Baseline Vocal Quality: Breathy;Hoarse;Low vocal intensity Volitional Cough: Weak    Oral/Motor/Sensory Function Overall Oral Motor/Sensory Function: Within functional limits  Ice Chips Ice chips: Within functional limits Presentation: Spoon   Thin Liquid Thin Liquid: Impaired Presentation:  Cup;Spoon;Straw Pharyngeal  Phase Impairments: Cough - Delayed    Nectar Thick Nectar Thick Liquid: Within functional limits Presentation: Cup   Honey Thick Honey Thick Liquid: Not tested   Puree Puree: Within functional limits Presentation: Spoon   Solid     Solid: Impaired Presentation: Self Fed Oral Phase Impairments: Impaired mastication Oral Phase Functional Implications: Impaired mastication;Prolonged oral transit      Lori Stanley 06/11/2019,1:36 PM

## 2019-06-11 NOTE — Progress Notes (Signed)
HD Tx Completed    06/11/19 2120  Vital Signs  Pulse Rate 69  Resp 20  BP 118/63  Oxygen Therapy  SpO2 98 %  O2 Device Nasal Cannula  O2 Flow Rate (L/min) 6 L/min  During Hemodialysis Assessment  Blood Flow Rate (mL/min) 200 mL/min  Arterial Pressure (mmHg) -80 mmHg  Venous Pressure (mmHg) 80 mmHg  Transmembrane Pressure (mmHg) 30 mmHg  Ultrafiltration Rate (mL/min) 500 mL/min  Dialysate Flow Rate (mL/min) 300 ml/min  Conductivity: Machine  13.9  HD Safety Checks Performed Yes  Intra-Hemodialysis Comments Tx completed;Tolerated well

## 2019-06-11 NOTE — Progress Notes (Signed)
Nutrition Follow-up  RD working remotely.  DOCUMENTATION CODES:   Not applicable  INTERVENTION:  Provide Hormel Shake po BID with lunch and dinner, each supplement provides 520 kcal and 22 grams of protein.  NUTRITION DIAGNOSIS:   Inadequate oral intake related to inability to eat as evidenced by NPO status.  Resolving - diet was advanced today.  GOAL:   Patient will meet greater than or equal to 90% of their needs  Progressing.  MONITOR:   PO intake, Supplement acceptance, Diet advancement, Labs, Weight trends, Skin, I & O's  REASON FOR ASSESSMENT:   Ventilator    ASSESSMENT:   86 year old female with PMHx of DM, HTN, HLD, hx MI, CHF admitted on 2/3 with acute on chronic hypoxic respiratory failure secondary to acute diastolic CHF exacerbation. On 2/4 patient suffered respiratory arrest requiring intubation and mechanical ventilation.  Patient was extubated on 2/8. Following SLP evaluation today diet was advanced to dysphagia 2 with nectar-thick liquids. Will monitor for adequacy of intake. Patient will benefit from oral nutrition supplements to help meet calorie/protein needs.  Medications reviewed and include: carvedilol, Epogen 10000 weekly, famotidine, Novolog 0-9 units Q4hrs, potassium chloride 20 mEq once today, senna-docusate 2 tablets BID, sertraline, vitamin B12 1000 micrograms daily, ceftazidime, Lasix infusion.  Labs reviewed: CBG 98-105, BUN 79, Creatinine 4.04.  Diet Order:   Diet Order            DIET DYS 2 Room service appropriate? Yes; Fluid consistency: Nectar Thick  Diet effective now             EDUCATION NEEDS:   No education needs have been identified at this time  Skin:  Skin Assessment: Reviewed RN Assessment  Last BM:  Unknown/PTA  Height:   Ht Readings from Last 1 Encounters:  06/10/19 5' 2.99" (1.6 m)   Weight:   Wt Readings from Last 1 Encounters:  06/11/19 61.9 kg   Ideal Body Weight:  52.3 kg  BMI:  Body mass index is  24.18 kg/m.  Estimated Nutritional Needs:   Kcal:  1600-1800  Protein:  75-85 grams  Fluid:  1.6 L/day  Jacklynn Barnacle, MS, RD, LDN Pager number available on Amion

## 2019-06-11 NOTE — Evaluation (Signed)
Physical Therapy Evaluation Patient Details Name: Lori Stanley MRN: [id number] DOB: 02/07/1932 Today's Date: 06/11/2019   History of Present Illness  pt is an 86 yo female admitted to telemetry unit for acute on chronic hypoxic respiratory failure, pt respiratory arrest on 2/4 requiring mechanical intubation and transfer to ICU. PMH includes CHF, HTN, CAD, DM, MI  Clinical Impression  Pt is an 86 yo female admitted for above. Pt received resting in bed and agreeable to PT. Pt reporting living alone in a single level home with a few steps to enter home. Pt reports that her niece stays with her often. Pt reports being pretty independent and still cooking some meals and denying any recent falls. Unsure of amount of assistance niece provides. Pt requiring min A to get EOB requiring physical assist for trunk elevation and scooting EOB. Pt requiring mod A to rise from bed with strong posterior lean with initial standing. Pt recognizing posterior lean but requiring constant tactile cuing with mod vc and mod physical assist to maintain standing balance. After working on establishing upright balance in static standing for 1-2 min with UE support with RW, pt able to perform standing marching with min A progressing to very close guarding. Pt presenting with difficulty managing RW. Pt frequently pulling on walker lifting front wheels off the ground and not able to assist with turning walker during transfer. Pt required mod A x2 for stand pivot transfer to recliner. Pt reaching for nursing students hand letting go of RW during transfer. Pt required max A for RW mgt during transfer with max multimodal cuing. Pt presents with dec strength, balance, and activity tolerance limiting functional mobility. Pt would benefit from further skilled PT to address noted deficits to improve safe mobility and would benefit from SNF following hospital discharge to improve independence, decrease fall risk and caregiver burden.      Follow Up Recommendations SNF    Equipment Recommendations  Other (comment)(TBD)    Recommendations for Other Services       Precautions / Restrictions Precautions Precautions: Fall Restrictions Weight Bearing Restrictions: No      Mobility  Bed Mobility Overal bed mobility: Needs Assistance Bed Mobility: Supine to Sit     Supine to sit: Min assist;HOB elevated     General bed mobility comments: min A to get EOB and scoot forward to allow feet to touch the ground, physical assist for trunk elevation  Transfers Overall transfer level: Needs assistance Equipment used: Rolling walker (2 wheeled) Transfers: Sit to/from [facility] Sit to Stand: Mod assist;+2 safety/equipment Stand pivot transfers: Mod assist;+2 physical assistance;+2 safety/equipment       General transfer comment: x2 trials, initially without RW, pt mod assist to rise with therapist in front, cuing for hand placement, heavy posterior lean with initial standing requiring min-mod A to maintain balance, second trial provided with RW, cuing for hand placement, difficulty with managing RW this date, mod A x2 for stand pivot to recliner, pt requiring max multimodal cuing for RW mgt, anterior weight shifting and technique, pt frequently pulling back on walker lifting wheels off ground and then letting go of RW and grabbing nursing students hand,  Ambulation/Gait             General Gait Details: deferred  Stairs            Wheelchair Mobility    Modified Rankin (Stroke Patients Only)       Balance Overall balance assessment: Needs assistance Sitting-balance support: Bilateral  upper extremity supported;Feet supported Sitting balance-Leahy Scale: Fair Sitting balance - Comments: pt min guard maintaining sitting EOB Postural control: Posterior lean Standing balance support: Bilateral upper extremity supported;During functional activity Standing balance-Leahy Scale:  Poor Standing balance comment: pt with mod-heavy posterior lean in standing and during transfers, requiring min-mod physical assist and constant tactile cuing with mod vc                             Pertinent Vitals/Pain Pain Assessment: No/denies pain    Home Living Family/patient expects to be discharged to:: Private residence Living Arrangements: Alone Available Help at Discharge: Family;Other (Comment);Available PRN/intermittently(niece stays with her often) Type of Home: House Home Access: Stairs to enter Entrance Stairs-Rails: Left;Right(pt has single HR, unsure of which side) Entrance Stairs-Number of Steps: 3-4 Home Layout: One level Home Equipment: Walker - 2 wheels Additional Comments: pt reports needing a shower chair    Prior Function Level of Independence: Independent         Comments: pt reports being independent with ADLs, niece stays with her some pt not able to specify how often and how niece specifically helps, pt reports cooking once in a while, denies any recent falls     Hand Dominance        Extremity/Trunk Assessment   Upper Extremity Assessment Upper Extremity Assessment: Defer to OT evaluation;Generalized weakness    Lower Extremity Assessment Lower Extremity Assessment: Generalized weakness(BLE strength grossly 3+/5)       Communication   Communication: No difficulties  Cognition Arousal/Alertness: Awake/alert Behavior During Therapy: WFL for tasks assessed/performed Overall Cognitive Status: Within Functional Limits for tasks assessed                                        General Comments General comments (skin integrity, edema, etc.): O2 sats maintained 94% or greater t/o session on 8L high flow    Exercises Total Joint Exercises Long Arc Quad: AROM;Both;10 reps Marching in Standing: AROM;Both;10 reps;Seated   Assessment/Plan    PT Assessment Patient needs continued PT services  PT Problem List  Decreased strength;Decreased mobility;Decreased safety awareness;Decreased range of motion;Decreased activity tolerance;Decreased balance;Decreased knowledge of use of DME;Cardiopulmonary status limiting activity;Decreased knowledge of precautions       PT Treatment Interventions DME instruction;Therapeutic exercise;Gait training;Balance training;Stair training;Neuromuscular re-education;Functional mobility training;Cognitive remediation;Therapeutic activities;Patient/family education    PT Goals (Current goals can be found in the Care Plan section)  Acute Rehab PT Goals Patient Stated Goal: get stronger PT Goal Formulation: With patient Time For Goal Achievement: 06/25/19 Potential to Achieve Goals: Fair    Frequency Min 2X/week   Barriers to discharge Decreased caregiver support      Co-evaluation               AM-PAC PT "6 Clicks" Mobility  Outcome Measure Help needed turning from your back to your side while in a flat bed without using bedrails?: A Little Help needed moving from lying on your back to sitting on the side of a flat bed without using bedrails?: A Little Help needed moving to and from a bed to a chair (including a wheelchair)?: A Lot Help needed standing up from a chair using your arms (e.g., wheelchair or bedside chair)?: A Lot Help needed to walk in hospital room?: A Lot Help needed climbing 3-5 steps with a railing? :  Total 6 Click Score: 13    End of Session Equipment Utilized During Treatment: Gait belt;Oxygen Activity Tolerance: Patient tolerated treatment well Patient left: in chair;with call bell/phone within reach(OT entering room for OT eval) Nurse Communication: Mobility status PT Visit Diagnosis: Unsteadiness on feet (R26.81);Muscle weakness (generalized) (M62.81);Difficulty in walking, not elsewhere classified (R26.2)    Time: [id number] PT Time Calculation (min) (ACUTE ONLY): 39 min   Charges:   PT Evaluation $PT Eval Moderate  Complexity: 1 Mod PT Treatments $Therapeutic Activity: 8-22 mins        Zachary George PT, DPT 3:58 PM,06/11/19 [phone]   Markevius Trombetta Drucilla Chalet 06/11/2019, 3:58 PM

## 2019-06-11 NOTE — Progress Notes (Signed)
Patient with no anxious events resulting in oxygen desaturation this shift. Oxygen titrated down to 6 L nasal cannula. Patient back to bed from chair (got to chair with PT) needing maximum assist and having lots of weakness even just standing and pivoting. Patient also had small bowel movement towards end of shift.

## 2019-06-11 NOTE — Progress Notes (Signed)
[place] for Electrolyte Monitoring and Replacement   Recent Labs: Potassium (mmol/L)  Date Value  06/11/2019 3.8  05/15/2013 4.0   Magnesium (mg/dL)  Date Value  06/10/2019 2.2   Calcium (mg/dL)  Date Value  06/11/2019 9.3   Calcium, Total (mg/dL)  Date Value  05/15/2013 8.0 (L)   Albumin (g/dL)  Date Value  06/07/2019 3.3 (L)  04/10/2019 4.1  05/11/2013 3.6   Phosphorus (mg/dL)  Date Value  06/10/2019 4.7 (H)   Sodium (mmol/L)  Date Value  06/11/2019 142  04/10/2019 143  05/15/2013 132 (L)     Assessment: Potassium 28mEq x 1 ordered this am.   Will obtain electrolytes with am labs.   Pharmacy will continue to monitor and adjust per consult.   Koji Niehoff L 06/11/2019 5:17 PM

## 2019-06-11 NOTE — Progress Notes (Signed)
HD Tx Started    06/11/19 1915  Vital Signs  Pulse Rate 84  Resp (!) 21  BP (!) 163/89  Oxygen Therapy  SpO2 95 %  O2 Device Nasal Cannula  O2 Flow Rate (L/min) 6 L/min  During Hemodialysis Assessment  Blood Flow Rate (mL/min) 200 mL/min  Arterial Pressure (mmHg) -50 mmHg  Venous Pressure (mmHg) 100 mmHg  Transmembrane Pressure (mmHg) 30 mmHg  Ultrafiltration Rate (mL/min) 500 mL/min  Dialysate Flow Rate (mL/min) 300 ml/min  Conductivity: Machine  13.9  HD Safety Checks Performed Yes  Dialysis Fluid Bolus Normal Saline  Bolus Amount (mL) 250 mL  Intra-Hemodialysis Comments Tx initiated

## 2019-06-11 NOTE — Progress Notes (Signed)
OT Cancellation Note  Patient Details Name: OCTA SANTIN MRN: [id number] DOB: [birth year]   Cancelled Treatment:    Reason Eval/Treat Not Completed: Other (comment). Consult received, chart reviewed. OT entered room as PT finished assessment. HD staff in after to set up for dialysis. Will hold OT evaluation and re-attempt next date as medically appropriate.   Jeni Salles, MPH, MS, OTR/L ascom [phone] 06/11/19, 3:12 PM

## 2019-06-11 NOTE — Plan of Care (Signed)
  Problem: Clinical Measurements: Goal: Ability to maintain clinical measurements within normal limits will improve Outcome: Progressing Goal: Will remain free from infection Outcome: Progressing Goal: Diagnostic test results will improve Outcome: Progressing Goal: Respiratory complications will improve Outcome: Progressing Goal: Cardiovascular complication will be avoided Outcome: Progressing  Pt tolerated well HD Tx and met the prescribed UF of 1L

## 2019-06-11 NOTE — Progress Notes (Signed)
Pre HD Tx Note    06/11/19 1900  Hand-Off documentation  Report given to (Full Name) Newt Minion RN   Report received from (Full Name) Adalberto Ill RN   Vital Signs  Temp 98.3 F (36.8 C)  Temp Source Oral  Pulse Rate 80  Resp (!) 25  BP (!) 144/61  BP Location Left Arm  BP Method Automatic  Patient Position (if appropriate) Lying  Oxygen Therapy  SpO2 99 %  O2 Device Nasal Cannula  O2 Flow Rate (L/min) 6 L/min  Pain Assessment  Pain Scale 0-10  Pain Score 0  Dialysis Weight  Weight 61.9 kg  Type of Weight Pre-Dialysis  Time-Out for Hemodialysis  What Procedure? HD  Pt Identifiers(min of two) MRN/Account#;First/Last Name  Correct Site? Yes  Correct Side? Yes  Correct Procedure? Yes  Consents Verified? Yes  Safety Precautions Reviewed? Yes  Engineer, civil (consulting) Number 8  Station Number  (ICU 02)  UF/Alarm Test Passed  Conductivity: Meter 13.6  Conductivity: Machine  14  pH 7.4  Reverse Osmosis 5  Normal Saline Lot Number [id number]  Dialyzer Lot Number [id number]  Disposable Set Lot Number [id number]  Machine Temperature 98.6 F (37 C)  Musician and Audible Yes  Blood Lines Intact and Secured Yes  Pre Treatment Patient Checks  Vascular access used during treatment Catheter  HD catheter dressing before treatment WDL  Patient is receiving dialysis in a chair  (In Bed)  Hepatitis B Surface Antigen Results Negative  Date Hepatitis B Surface Antigen Drawn 06/07/19  Hepatitis B Surface Antibody  (<10)  Date Hepatitis B Surface Antibody Drawn 12/05/19  Hemodialysis Consent Verified Yes  Hemodialysis Standing Orders Initiated Yes  ECG (Telemetry) Monitor On Yes  Prime Ordered Normal Saline  Length of  DialysisTreatment -hour(s) 2 Hour(s)  Dialysis Treatment Comments  (Na140)  Dialyzer Elisio 17H NR  Dialysate 3K;2.5 Ca  Dialysis Anticoagulant None  Dialysate Flow Ordered 300  Blood Flow Rate Ordered 200 mL/min  Ultrafiltration Goal 0.5 Liters  Pre  Treatment Labs Phosphorus  Dialysis Blood Pressure Support Ordered Albumin  Education / Care Plan  Dialysis Education Provided Yes  Documented Education in Care Plan Yes  Hemodialysis Catheter Right Internal jugular Double lumen Permanent (Tunneled)  Placement Date/Time: 06/07/19 1733   Time Out: Correct patient;Correct procedure;Correct site  Maximum sterile barrier precautions: Hand hygiene;Cap;Mask;Sterile gown;Sterile gloves;Large sterile sheet  Site Prep: Chlorhexidine (preferred)  Local Anes...  Site Condition No complications  Blue Lumen Status Flushed;Blood return noted;Infusing  Red Lumen Status Infusing;Flushed;Blood return noted  Purple Lumen Status N/A  Catheter fill solution Heparin 1000 units/ml  Dressing Type Biopatch;Occlusive  Dressing Status Clean;Dry;Intact  Interventions New dressing  Drainage Description None  Dressing Change Due 06/15/19

## 2019-06-11 NOTE — Progress Notes (Signed)
Lori Stanley, [place] 06/11/19  Subjective:   Hospital day # 6 Patient is now extubated Feeling better Able to have conversation.     02/08 0701 - 02/09 0700 In: 604.4 [I.V.:298.2; IV Piggyback:306.2] Out: 1875 [Urine:1875] Lab Results  Component Value Date   CREATININE 4.04 (H) 06/11/2019   CREATININE 4.00 (H) 06/10/2019   CREATININE 3.88 (H) 06/09/2019     Objective:  Vital signs in last 24 hours:  Temp:  [97.7 F (36.5 C)-98.3 F (36.8 C)] 98.1 F (36.7 C) (02/09 1200) Pulse Rate:  [70-93] 76 (02/09 1400) Resp:  [13-26] 17 (02/09 1400) BP: (117-177)/(51-104) 129/52 (02/09 1400) SpO2:  [86 %-100 %] 98 % (02/09 1400) FiO2 (%):  [40 %-50 %] 40 % (02/09 0400) Weight:  [61.9 kg] 61.9 kg (02/09 0500)  Weight change: -4.8 kg Filed Weights   06/09/19 0358 06/10/19 0355 06/11/19 0500  Weight: 68.6 kg 66.7 kg 61.9 kg    Intake/Output:    Intake/Output Summary (Last 24 hours) at 06/11/2019 1428 Last data filed at 06/11/2019 1300 Gross per 24 hour  Intake 292.27 ml  Output 1790 ml  Net -1497.73 ml     Physical Exam: General:  Critically ill-appearing, laying in the bed  HEENT  ET tube, OG tube  Pulm/lungs  ventilator assisted  CVS/Heart  regular, no rub  Abdomen:   Soft  Extremities:  Peripheral edema present  Neurologic:  able to follow simple commands  Skin:  Warm  Access:  Right IJ PermCath       Basic Metabolic Panel:  Recent Labs  Lab 06/06/19 0913 06/06/19 2023 06/07/19 0612 06/07/19 [id number] 06/07/19 [id number] 06/07/19 [id number] 06/08/19 0326 06/08/19 0326 06/09/19 0448 06/10/19 0424 06/11/19 0434  NA  --    < > 139   < > 138  --  139  --  139 137 142  K  --    < > 4.2   < > 4.2  --  3.7  --  3.3* 3.7 3.8  CL  --    < > 103   < > 103  --  104  --  105 104 107  CO2  --    < > 25   < > 26  --  24  --  22 21* 22  GLUCOSE  --    < > 111*   < > 109*  --  113*  --  116* 114* 101*  BUN  --    < > 63*   < > 66*  --  65*  --  66* 71*  79*  CREATININE  --    < > 3.81*   < > 3.95*  --  3.75*  --  3.88* 4.00* 4.04*  CALCIUM  --    < > 9.5   < > 9.4   < > 9.3   < > 9.1 8.9 9.3  MG 2.5*  --   --   --  2.6*  --  2.4  --   --  2.2  --   PHOS  --   --  4.3  --   --   --   --   --   --  4.7*  --    < > = values in this interval not displayed.     CBC: Recent Labs  Lab 06/05/19 0647 06/06/19 0913 06/06/19 2023 06/07/19 0629 06/08/19 0326 06/09/19 0448 06/11/19 0434  WBC 5.3   < > 6.0 6.5  6.9 9.1 11.7*  NEUTROABS 4.3  --  5.2  --   --   --   --   HGB 7.1*   < > 8.6* 8.0* 8.8* 8.9* 7.8*  HCT 23.6*   < > 29.0* 25.5* 26.6* 26.9* 25.4*  MCV 88.7   < > 88.4 86.4 82.4 81.0 86.1  PLT 210   < > 267 243 263 250 249   < > = values in this interval not displayed.      Lab Results  Component Value Date   HEPBSAG NON REACTIVE 06/07/2019   HEPBSAB NON REACTIVE 06/07/2019   HEPBIGM NON REACTIVE 06/07/2019      Microbiology:  Recent Results (from the past 240 hour(s))  Respiratory Panel by RT PCR (Flu A&B, Covid) - Nasopharyngeal Swab     Status: None   Collection Time: 06/05/19  2:27 AM   Specimen: Nasopharyngeal Swab  Result Value Ref Range Status   SARS Coronavirus 2 by RT PCR NEGATIVE NEGATIVE Final    Comment: (NOTE) SARS-CoV-2 target nucleic acids are NOT DETECTED. The SARS-CoV-2 RNA is generally detectable in upper respiratoy specimens during the acute phase of infection. The lowest concentration of SARS-CoV-2 viral copies this assay can detect is 131 copies/mL. A negative result does not preclude SARS-Cov-2 infection and should not be used as the sole basis for treatment or other patient management decisions. A negative result may occur with  improper specimen collection/handling, submission of specimen other than nasopharyngeal swab, presence of viral mutation(s) within the areas targeted by this assay, and inadequate number of viral copies (<131 copies/mL). A negative result must be combined with  clinical observations, patient history, and epidemiological information. The expected result is Negative. Fact Sheet for Patients:  PinkCheek.be Fact Sheet for Healthcare Providers:  GravelBags.it This test is not yet ap proved or cleared by the [place] FDA and  has been authorized for detection and/or diagnosis of SARS-CoV-2 by FDA under an Emergency Use Authorization (EUA). This EUA will remain  in effect (meaning this test can be used) for the duration of the COVID-19 declaration under Section 564(b)(1) of the Act, 21 U.S.C. section 360bbb-3(b)(1), unless the authorization is terminated or revoked sooner.    Influenza A by PCR NEGATIVE NEGATIVE Final   Influenza B by PCR NEGATIVE NEGATIVE Final    Comment: (NOTE) The Xpert Xpress SARS-CoV-2/FLU/RSV assay is intended as an aid in  the diagnosis of influenza from Nasopharyngeal swab specimens and  should not be used as a sole basis for treatment. Nasal washings and  aspirates are unacceptable for Xpert Xpress SARS-CoV-2/FLU/RSV  testing. Fact Sheet for Patients: PinkCheek.be Fact Sheet for Healthcare Providers: GravelBags.it This test is not yet approved or cleared by the [place] FDA and  has been authorized for detection and/or diagnosis of SARS-CoV-2 by  FDA under an Emergency Use Authorization (EUA). This EUA will remain  in effect (meaning this test can be used) for the duration of the  Covid-19 declaration under Section 564(b)(1) of the Act, 21  U.S.C. section 360bbb-3(b)(1), unless the authorization is  terminated or revoked. Performed at [facility], [address] [place], [place] [address]   MRSA PCR Screening     Status: None   Collection Time: 06/07/19 12:53 AM   Specimen: Nasopharyngeal  Result Value Ref Range Status   MRSA by PCR NEGATIVE NEGATIVE Final    Comment:         The GeneXpert MRSA Assay (FDA approved for NASAL specimens  only), is one component of a comprehensive MRSA colonization surveillance program. It is not intended to diagnose MRSA infection nor to guide or monitor treatment for MRSA infections. Performed at [facility], [address] [place], [place] [address]   Culture, respiratory (non-expectorated)     Status: None (Preliminary result)   Collection Time: 06/09/19 10:11 AM   Specimen: Tracheal Aspirate; Respiratory  Result Value Ref Range Status   Specimen Description   Final    TRACHEAL ASPIRATE Performed at [facility], [address], [place], [place] [address]    Special Requests   Final    NONE Performed at [facility], [address] [place], [place] [address]    Gram Stain   Final    MODERATE WBC PRESENT, PREDOMINANTLY PMN MODERATE GRAM POSITIVE COCCI IN PAIRS IN CHAINS FEW GRAM VARIABLE ROD    Culture   Final    FEW Consistent with normal respiratory flora. Performed at [facility], [place] [address], [place], [place] [address]    Report Status PENDING  Incomplete    Coagulation Studies: No results for input(s): LABPROT, INR in the last 72 hours.  Urinalysis: No results for input(s): COLORURINE, LABSPEC, PHURINE, GLUCOSEU, HGBUR, BILIRUBINUR, KETONESUR, PROTEINUR, UROBILINOGEN, NITRITE, LEUKOCYTESUR in the last 72 hours.  Invalid input(s): APPERANCEUR    Imaging: DG Chest Port 1 View  Result Date: 06/11/2019 CLINICAL DATA:  Shortness of breath. Acute on chronic congestive heart failure. EXAM: PORTABLE CHEST 1 VIEW COMPARISON:  Chest x-rays dated 06/09/2019 and 06/08/2019 FINDINGS: Endotracheal tube and NG tube have been removed. Double lumen central venous catheter is in place, unchanged. Heart size is normal. The pulmonary vascularity has improved. Improved aeration at the right lung base. There is a persistent small left effusion and atelectasis at the left base with  minimal residual atelectasis on the right. IMPRESSION: Improving pulmonary vascular congestion. Improved aeration at the right lung base. Aortic Atherosclerosis (ICD10-I70.0). Electronically Signed   By: Lorriane Shire M.D.   On: 06/11/2019 11:00     Medications:   . sodium chloride    . cefTAZidime (FORTAZ)  IV Stopped (06/11/19 1010)  . furosemide (LASIX) infusion 10 mg/hr (06/11/19 1300)   . aspirin  81 mg Oral Daily  . atorvastatin  20 mg Oral q1800  . brimonidine  1 drop Both Eyes BID  . carvedilol  12.5 mg Oral BID  . Chlorhexidine Gluconate Cloth  6 each Topical Daily  . dorzolamide-timolol  1 drop Both Eyes BID  . epoetin (EPOGEN/PROCRIT) injection  10,000 Units Subcutaneous Weekly  . famotidine  20 mg Oral QHS  . insulin aspart  0-9 Units Subcutaneous Q4H  . ipratropium-albuterol  3 mL Nebulization QID  . latanoprost  1 drop Left Eye QHS  . mouth rinse  15 mL Mouth Rinse BID  . olopatadine  1 drop Both Eyes BID  . potassium chloride  20 mEq Oral Once  . sertraline  25 mg Oral Daily  . sodium chloride flush  3 mL Intravenous Q12H  . vitamin B-12  1,000 mcg Oral Daily   sodium chloride, acetaminophen, ALPRAZolam, bisacodyl, hydrALAZINE, ondansetron (ZOFRAN) IV, sodium chloride flush  Assessment/ Plan:  86 y.o.African [place] female with diabetes mellitus type II, hypertension, anemia, GERD, depression, anxiety, glaucoma, hyperlipidemia, diastolic congestive heart failure, coronary artery disease admitted on 06/05/2019 for SOB (shortness of breath) [R06.02] Acute CHF (congestive heart failure) ([place]) [I50.9] Acute respiratory failure with hypoxia ([place]) [J96.01] Heart failure ([place]) [I50.9] Acute  on chronic congestive heart failure, unspecified heart failure type ([place]) [I50.9]   1.  End-stage renal disease Chronic kidney disease is likely secondary to diabetic nephropathy and hypertensive nephrosclerosis.  This admission, patient presented via EMS from home for respiratory  distress, shortness of breath, worsening lower extremity edema.  She has failed outpatient volume management. Dialysis catheter was placed this admission Overall patient's clinical condition has improved.  She is now extubated Will initiate hemodialysis today and start outpatient discharge planning Creatinine 4/GFR 11 Next hemodialysis treatment planned for tomorrow  2.  Acute respiratory failure Hospital course complicated by respiratory arrest the night of February 4  Responded well to furosemide infusion Volume status appears to have optimized Extubated February 8  3. Diabetes type 2 with CKD Lab Results  Component Value Date   HGBA1C 6.0 (H) 04/10/2019   4. Anemia of CKD -  Lab Results  Component Value Date   HGB 7.8 (L) 06/11/2019   EPO SQ, saturday  5. [facility] Lab Results  Component Value Date   PTH 148 (H) 06/07/2019   CALCIUM 9.3 06/11/2019   PHOS 4.7 (H) 06/10/2019        LOS: [place] 2/9/20212:28 PM  [place], [place]  Note: This note was prepared with Dragon dictation. Any transcription errors are unintentional

## 2019-06-11 NOTE — Progress Notes (Signed)
Progress Note  Patient Name: Lori Stanley Date of Encounter: 06/11/2019  Primary Cardiologist: Dr. Fletcher Anon  Subjective   Extubated yesterday.   She states that she is feeling OK, though is fairly hoarse from intubation. She denies CP, palpitations, or racing HR. She states she is breathing better but still on 12L [place] oxygen while I am in the room.   Inpatient Medications    Scheduled Meds: . aspirin  81 mg Oral Daily  . atorvastatin  20 mg Oral q1800  . brimonidine  1 drop Both Eyes BID  . carvedilol  12.5 mg Oral BID  . chlorhexidine gluconate (MEDLINE KIT)  15 mL Mouth Rinse BID  . Chlorhexidine Gluconate Cloth  6 each Topical Daily  . dorzolamide-timolol  1 drop Both Eyes BID  . epoetin (EPOGEN/PROCRIT) injection  10,000 Units Subcutaneous Weekly  . famotidine  20 mg Oral QHS  . insulin aspart  0-9 Units Subcutaneous Q4H  . ipratropium-albuterol  3 mL Nebulization QID  . latanoprost  1 drop Left Eye QHS  . mouth rinse  15 mL Mouth Rinse 10 times per day  . olopatadine  1 drop Both Eyes BID  . potassium chloride  20 mEq Oral Once  . sertraline  25 mg Oral Daily  . sodium chloride flush  3 mL Intravenous Q12H  . vancomycin variable dose per unstable renal function (pharmacist dosing)   Does not apply See admin instructions  . vitamin B-12  1,000 mcg Oral Daily   Continuous Infusions: . sodium chloride    . cefTAZidime (FORTAZ)  IV Stopped (06/10/19 1203)  . furosemide (LASIX) infusion 10 mg/hr (06/11/19 0600)   PRN Meds: sodium chloride, acetaminophen, ALPRAZolam, bisacodyl, hydrALAZINE, ondansetron (ZOFRAN) IV, sodium chloride flush   Vital Signs    Vitals:   06/11/19 0400 06/11/19 0431 06/11/19 0500 06/11/19 0600  BP: (!) 142/54  (!) 161/75 (!) 150/59  Pulse: 84  90 72  Resp: (!) _0 Temp: 98.2 F (36.8 C)     TempSrc: Oral     SpO2: (!) 89% 93% 96% 96%  Weight:   61.9 kg   Height:        Intake/Output Summary (Last 24 hours) at 06/11/2019  0801 Last data filed at 06/11/2019 0600 Gross per 24 hour  Intake 554.71 ml  Output 1640 ml  Net -1085.29 ml   Last 3 Weights 06/11/2019 06/10/2019 06/09/2019  Weight (lbs) 136 lb 7.4 oz 147 lb 0.8 oz 151 lb 3.8 oz  Weight (kg) 61.9 kg 66.7 kg 68.6 kg      Telemetry    NSR, 70-80s, PACs/PVCs, NSVT at 5:51AM, ectopy with compensatory pauses (longest estimated at 1.43 seconds.   - Personally Reviewed  ECG    No new tracings- Personally Reviewed  Physical Exam   GEN: No acute distress. Neck: No JVD Cardiac: RRR, 1/6 systolic murmur. No rubs or gallops.  Respiratory: Coarse breath sounds / bilateral rhonchi. GI: Soft, nontender, non-distended  MS: Bilateral SCDs, wrinkling in bilateral extremities without edema. No deformity. Neuro:  Alert and oriented s/p extubation.  Psych: Pleasant  Labs    High Sensitivity Troponin:   Recent Labs  Lab 06/05/19 0102 06/05/19 0227 06/06/19 0621 06/06/19 2023 06/06/19 2145  TROPONINIHS _1 Cardiac EnzymesNo results for input(s): TROPONINI in the last 168 hours. No results for input(s): TROPIPOC in the last 168 hours.   Chemistry Recent Labs  Lab 06/05/19  0102 06/06/19 9417 06/06/19 2023 06/06/19 2023 06/07/19 0612 06/07/19 0629 06/09/19 0448 06/10/19 0424 06/11/19 0434  NA 136   < > 137   < > 139   < > 139 137 142  K 4.7   < > 4.7   < > 4.2   < > 3.3* 3.7 3.8  CL 102   < > 103   < > 103   < > 105 104 107  CO2 25   < > 25   < > 25   < > 22 21* 22  GLUCOSE 150*   < > 147*   < > 111*   < > 116* 114* 101*  BUN 61*   < > 65*   < > 63*   < > 66* 71* 79*  CREATININE 3.86*   < > 3.74*   < > 3.81*   < > 3.88* 4.00* 4.04*  CALCIUM 9.2   < > 9.6   < > 9.5   < > 9.1 8.9 9.3  PROT 6.7  --  6.3*  --   --   --   --   --   --   ALBUMIN 3.6  --  3.5  --  3.3*  --   --   --   --   AST 26  --  26  --   --   --   --   --   --   ALT 19  --  23  --   --   --   --   --   --   ALKPHOS 69  --  68  --   --   --   --   --   --   BILITOT  0.6  --  0.6  --   --   --   --   --   --   GFRNONAA 10*   < > 10*   < > 10*   < > 10* 10* 9*  GFRAA 12*   < > 12*   < > 12*   < > 12* 11* 11*  ANIONGAP 9   < > 9   < > 11   < > _0 < > = values in this interval not displayed.     Hematology Recent Labs  Lab 06/08/19 0326 06/09/19 0448 06/11/19 0434  WBC 6.9 9.1 11.7*  RBC 3.23* 3.32* 2.95*  HGB 8.8* 8.9* 7.8*  HCT 26.6* 26.9* 25.4*  MCV 82.4 81.0 86.1  MCH 27.2 26.8 26.4  MCHC 33.1 33.1 30.7  RDW 14.5 14.5 14.6  PLT 263 250 249    BNP Recent Labs  Lab 06/05/19 0102 06/06/19 2023  BNP 577.0* 549.0*     DDimer No results for input(s): DDIMER in the last 168 hours.   Radiology    DG Chest Port 1 View  Result Date: 06/09/2019 CLINICAL DATA:  Pulmonary disease. EXAM: PORTABLE CHEST 1 VIEW COMPARISON:  June 07, 2019. FINDINGS: Stable cardiomediastinal silhouette. Endotracheal and nasogastric tubes are unchanged in position. Right internal jugular catheter is unchanged. No pneumothorax is noted. Mild central pulmonary vascular congestion is noted. Increased bibasilar atelectasis or edema is noted with small pleural effusions. Bony thorax is unremarkable. IMPRESSION: Stable support apparatus. Increased bibasilar atelectasis or edema is noted with small pleural effusions. Electronically Signed   By: Marijo Conception M.D.   On: 06/09/2019 10:47    Cardiac Studies   Echo  06/05/2019 1. Left ventricular ejection fraction, by visual estimation, is 60 to  65%. The left ventricle has normal function. There is moderately increased  left ventricular hypertrophy.  2. The left ventricle has no regional wall motion abnormalities.  3. Left ventricular diastolic parameters are consistent with Grade II  diastolic dysfunction (pseudonormalization).  4. Global right ventricle has normal systolic function.The right  ventricular size is normal. No increase in right ventricular wall  thickness.  5. Left atrial size was severely  dilated.  6. Mildly elevated pulmonary artery systolic pressure.  7. The inferior vena cava is dilated in size with <50% respiratory  variability, suggesting right atrial pressure of 15 mmHg.  8. pleural effusion noted on left, 7 cm  9. Small pericardial effusion.   05/30/2013 NM Myoview No significant wall motion abnormality noted. Overall, low risk scan. No significant ischemia. EF 64%. LV global function normal. No EKG changes concerning for ischemia. Attenuation artifact noted in the study.  Patient Profile     86 y.o. female with a history of chronic diastolic heart failure, hypertension, DM2, CKD, and who is being seen today for the evaluation of acute on chronic diastolic heart failure.  Assessment & Plan    Acute on chronic diastolicheart failure -Extubated yesterday 2/8 s/p transfer and intubation over the weekend 2/2 worsening respiratory status.IV diuresis has been complicated by AOCKD and respiratory status this admission.  Currently on IV lasix drip at 63m/hr with significantly improved volume status since admission but worsening renal function. I/Os net -5.7L for the admission and -1.3L yesterday. Wt. 155.3lbs  136.47lbs.  Permcath placed over the weekend with tentative plan for dialysis today for further volume management. Continue to monitor daily BMETto monitor renal function and electrolytes, daily weights. Continue BP control as below.   Respiratory failure, PNA --Improved s/p respiratory failure 2/4 and extubation 2/8. On 12L [place] oxygen. Per CCM. Caution with vancomycin 2/2 renal function as outlined below.   Hypertension --Still poorly controlled.Continue Coreg 12.553mBID and hydralazine PRN. PTA amlodipine fell off of list at admission. Reached out this AM to nephrology regarding addition of amlodipine, as would want to avoid further AKI 2/2 pre-renal injury from BP drops during HD. Current renal function precludes the addition ofACE/ARB/ARNI/MRA.    Hypokalemia --Replete with goal 4.0. Ordered additional KCL tab 2037mtoday. Maintain Mg with goal 2.0. Daily BMET.  Hyperlipidemia --Restarted statin, as she had discontinued this as an outpatient. LFTs and lipids in 6 to 8 weeks.  AOCKD --Currently followed by [facility] (sees Dr. KolJuleen China an outpatient). Consider poorly controlled diabetes and BP as contributing to current renal function, as well as poorly controlled BP. Avoid nephrotoxins, including contrast studies. Suspect that abx (including vancomycin) also contribute to current renal function and recommend caution. No ACE/ARB/ARNI/MRA given renal function.   Anemia, suspect anemia of chronic dz --Daily CBC. Epogen. Suspect at least in part anemia of chronic dz. Recommend maintain Hgb above 8.0. Further workup per IM / nephrology.   DM2 --Recommend strict glycemic control for risk factor modification. Currently not well controlled.04/2019 HgbA1c 6.0. SSI. Per IM.  For questions or updates, please contact [facility] consult [email or web address] for contact info under        Signed, JacArvil ChacoA-C  06/11/2019, 8:01 AM

## 2019-06-11 NOTE — Consult Note (Signed)
PHARMACY CONSULT NOTE - FOLLOW UP  Pharmacy Consult for Electrolyte Monitoring and Replacement   Recent Labs: Potassium (mmol/L)  Date Value  06/11/2019 3.8  05/15/2013 4.0   Magnesium (mg/dL)  Date Value  06/10/2019 2.2   Calcium (mg/dL)  Date Value  06/11/2019 9.3   Calcium, Total (mg/dL)  Date Value  05/15/2013 8.0 (L)   Albumin (g/dL)  Date Value  06/07/2019 3.3 (L)  04/10/2019 4.1  05/11/2013 3.6   Phosphorus (mg/dL)  Date Value  06/10/2019 4.7 (H)   Sodium (mmol/L)  Date Value  06/11/2019 142  04/10/2019 143  05/15/2013 132 (L)     Assessment: Pharmacy has been consulted to manage electrolytes in this 86 y.o.African [place] female with DM type II, HTN, anemia, GERD, depression, anxiety, glaucoma, HLD, diastolic congestive HF, CAD admitted on 06/05/2019 for SOB. Patient is currently extubated.  She is currently on furosemide 250mg  in dextrose 5% 239ml.   1.  Electrolytes Given cardiac history, goal of therapy is potassium ~ 4 mmol/L and magnesium ~2 mg/dL.  All other electrolytes WNL. Ordered KCl 20 mEq  PO x 1 dose.  Will follow electrolytes with AM labs.   2.  Glucose BG this AM was 101. A1c was 6% on 04/10/2019. Pt has history of DM type II.  Will continue to monitor glucose daily as diet advances.  3.  Constipation Last BM prior to admission (admitted 2/4).  Currently on bisacodyl prn which was never administered. Pt not on any opioid meds.  Will add senna-docusate 2 tablets BID. Will continue to monitor.   [place] Student 06/11/2019 12:24 PM

## 2019-06-11 NOTE — Progress Notes (Signed)
Post HD Tx Note   06/11/19 2130  Hand-Off documentation  Report given to (Full Name) Lurena Nida RN   Report received from (Full Name) Newt Minion RN   Vital Signs  Temp 98.3 F (36.8 C)  Temp Source Oral  Pulse Rate 76  Pulse Rate Source Monitor  Resp (!) 23  BP 125/90  BP Location Left Arm  BP Method Automatic  Patient Position (if appropriate) Lying  Oxygen Therapy  SpO2 94 %  O2 Device Nasal Cannula  O2 Flow Rate (L/min) 6 L/min  Post-Hemodialysis Assessment  Rinseback Volume (mL) 250 mL  KECN 25 V  Dialyzer Clearance Lightly streaked  Duration of HD Treatment -hour(s) 2 hour(s)  Hemodialysis Intake (mL) 500 mL  UF Total -Machine (mL) 1000 mL  Net UF (mL) 500 mL  Tolerated HD Treatment Yes  Hemodialysis Catheter Right Internal jugular Double lumen Permanent (Tunneled)  Placement Date/Time: 06/07/19 1733   Time Out: Correct patient;Correct procedure;Correct site  Maximum sterile barrier precautions: Hand hygiene;Cap;Mask;Sterile gown;Sterile gloves;Large sterile sheet  Site Prep: Chlorhexidine (preferred)  Local Anes...  Site Condition No complications  Blue Lumen Status Heparin locked  Red Lumen Status Heparin locked  Purple Lumen Status N/A  Catheter fill solution Heparin 1000 units/ml  Catheter fill volume (Arterial) 1.6 cc  Catheter fill volume (Venous) 1.6  Dressing Type Biopatch  Dressing Status Clean;Dry;Intact  Interventions Dressing reinforced  Drainage Description None  Dressing Change Due 06/15/19  Post treatment catheter status Capped and Clamped

## 2019-06-11 NOTE — Progress Notes (Signed)
Post HD Tx Assessment   06/11/19 2119  Neurological  Level of Consciousness Alert  Orientation Level Oriented X4  Respiratory  Respiratory Pattern Regular;Unlabored  Chest Assessment Chest expansion symmetrical  Bilateral Breath Sounds Diminished  Cough Non-productive  Cardiac  Pulse Regular  Heart Sounds S1, S2  Jugular Venous Distention (JVD) No  Cardiac Rhythm NSR  Antiarrhythmic device No  Vascular  R Radial Pulse +2  L Radial Pulse +2  R Dorsalis Pedis Pulse +1  L Dorsalis Pedis Pulse +1  RUE Edema Non-pitting  LUE Edema Non-pitting  RLE Edema Non-pitting  LLE Edema Non-Pitting  Integumentary  Integumentary (WDL) X  Skin Color Appropriate for ethnicity  Skin Condition Dry  Skin Integrity Skin tear  Musculoskeletal  Musculoskeletal (WDL) X  Generalized Weakness Yes  Gastrointestinal  Bowel Sounds Assessment Active  GU Assessment  Genitourinary (WDL) X  Genitourinary Symptoms Urinary Catheter  Psychosocial  Psychosocial (WDL) X  Patient Behaviors Anxious;Cooperative  Needs Expressed Emotional;Physical  Emotional support given Given to patient

## 2019-06-11 NOTE — Progress Notes (Signed)
CRITICAL CARE PROGRESS NOTE    Name: Lori Stanley MRN: [id number] DOB: February 17, 1932     LOS: 6   SUBJECTIVE FINDINGS & SIGNIFICANT EVENTS   Patient description:  86 yo female admitted to the telemetry unit with acute on chronic hypoxic respiratory failure secondary to acute diastolic CHF exacerbation.  On 02/4 pt respiratory arrested requiring mechanical intubation and transfer to ICU  Lines / Drains: PIV  Cultures / Sepsis markers: COVID negative  Antibiotics: vanco/ceftaz   Protocols / Consultants: Vascular , Nephrology, PCCM, hospitalist  06/08/19 - patient has been diuresing well, we did ultrasound asessment at bedside and pleural effusion is now small.  06/09/19 -patient has thickened airway secretions this am which were sent for micro, empiric abx broadened, attempting weaning SBT trial today.  Discussed care plan with niece [name] today.  06/10/19 -airway secretions are significantly improved. Will perform SBT today and liberate from MV 06/11/19- patient has clinically improved, for swallow eval today, develops atelectasis working on chest PT today, CXR today   PAST MEDICAL HISTORY   Past Medical History:  Diagnosis Date  . Acute heart failure ([place])   . Chronic low back pain   . Diabetes mellitus without complication ([place])   . Hyperlipidemia   . Hypertension   . MI (myocardial infarction) ([place])   . Obesity      SURGICAL HISTORY   Past Surgical History:  Procedure Laterality Date  . DIALYSIS/PERMA CATHETER INSERTION N/A 06/07/2019   Procedure: DIALYSIS/PERMA CATHETER INSERTION;  Surgeon: Katha Cabal, MD;  Location: [facility];  Service: Cardiovascular;  Laterality: N/A;  . EYE SURGERY    . KNEE SURGERY    . THYROID SURGERY    . VAGINAL HYSTERECTOMY       FAMILY  HISTORY   Family History  Problem Relation Age of Onset  . Heart attack Mother   . Heart disease Sister   . Cancer Sister      SOCIAL HISTORY   Social History   Tobacco Use  . Smoking status: Former Smoker    Types: Cigarettes  . Smokeless tobacco: Never Used  . Tobacco comment: Quit in 2015-2016  Substance Use Topics  . Alcohol use: No  . Drug use: No     MEDICATIONS   Current Medication:  Current Facility-Administered Medications:  .  0.9 %  sodium chloride infusion, 250 mL, Intravenous, PRN, Mansy, Jan A, MD .  acetaminophen (TYLENOL) suppository 650 mg, 650 mg, Rectal, Q4H PRN, Awilda Bill, NP .  ALPRAZolam Duanne Moron) tablet 0.25 mg, 0.25 mg, Oral, BID PRN, Awilda Bill, NP, 0.25 mg at 06/11/19 0256 .  aspirin chewable tablet 81 mg, 81 mg, Oral, Daily, Lanney Gins, Deondrea Aguado, MD .  atorvastatin (LIPITOR) tablet 20 mg, 20 mg, Oral, q1800, Ottie Glazier, MD .  bisacodyl (DULCOLAX) suppository 10 mg, 10 mg, Rectal, Daily PRN, Awilda Bill, NP .  brimonidine (ALPHAGAN) 0.2 % ophthalmic solution 1 drop, 1 drop, Both Eyes, BID, Mansy, Jan A, MD, 1 drop at 06/10/19 2207 .  carvedilol (COREG) tablet 12.5 mg, 12.5 mg, Oral, BID, Lanney Gins, Elishah Ashmore, MD .  cefTAZidime (FORTAZ) 0.5 g in dextrose 5 % 50 mL IVPB, 0.5 g, Intravenous, Q24H, Ottie Glazier, MD, Stopped at 06/10/19 1203 .  chlorhexidine gluconate (MEDLINE KIT) (PERIDEX) 0.12 % solution 15 mL, 15 mL, Mouth Rinse, BID, Blakeney, Dana G, NP, 15 mL at 06/11/19 0847 .  Chlorhexidine Gluconate Cloth 2 % PADS 6 each, 6 each, Topical, Daily, Blakeney,  Dreama Saa, NP, 6 each at 06/10/19 1318 .  dorzolamide-timolol (COSOPT) 22.3-6.8 MG/ML ophthalmic solution 1 drop, 1 drop, Both Eyes, BID, Mansy, Jan A, MD, 1 drop at 06/10/19 2206 .  epoetin alfa (EPOGEN) injection 10,000 Units, 10,000 Units, Subcutaneous, Weekly, Candiss Norse, Harmeet, MD .  famotidine (PEPCID) tablet 20 mg, 20 mg, Oral, QHS, Joslin Doell, MD .  furosemide (LASIX)  250 mg in dextrose 5 % 250 mL (1 mg/mL) infusion, 10 mg/hr, Intravenous, Continuous, Ouma, Bing Neighbors, NP, Last Rate: 10 mL/hr at 06/11/19 0600, 10 mg/hr at 06/11/19 0600 .  hydrALAZINE (APRESOLINE) injection 10-20 mg, 10-20 mg, Intravenous, Q4H PRN, Awilda Bill, NP, 20 mg at 06/08/19 1939 .  insulin aspart (novoLOG) injection 0-9 Units, 0-9 Units, Subcutaneous, Q4H, Awilda Bill, NP, 1 Units at 06/10/19 1635 .  ipratropium-albuterol (DUONEB) 0.5-2.5 (3) MG/3ML nebulizer solution 3 mL, 3 mL, Nebulization, QID, Enzo Bi, MD, 3 mL at 06/11/19 0724 .  latanoprost (XALATAN) 0.005 % ophthalmic solution 1 drop, 1 drop, Left Eye, QHS, Mansy, Jan A, MD, 1 drop at 06/10/19 2205 .  MEDLINE mouth rinse, 15 mL, Mouth Rinse, 10 times per day, Awilda Bill, NP, 15 mL at 06/11/19 0200 .  olopatadine (PATANOL) 0.1 % ophthalmic solution 1 drop, 1 drop, Both Eyes, BID, Mansy, Jan A, MD, 1 drop at 06/10/19 2207 .  ondansetron (ZOFRAN) injection 4 mg, 4 mg, Intravenous, Q6H PRN, Mansy, Jan A, MD .  potassium chloride SA (KLOR-CON) CR tablet 20 mEq, 20 mEq, Oral, Once, Visser, Jacquelyn D, PA-C .  sertraline (ZOLOFT) tablet 25 mg, 25 mg, Oral, Daily, Jariyah Hackley, MD .  sodium chloride flush (NS) 0.9 % injection 3 mL, 3 mL, Intravenous, Q12H, Mansy, Jan A, MD, 3 mL at 06/10/19 2208 .  sodium chloride flush (NS) 0.9 % injection 3 mL, 3 mL, Intravenous, PRN, Mansy, Jan A, MD .  vancomycin variable dose per unstable renal function (pharmacist dosing), , Does not apply, See admin instructions, Charlett Nose, RPH .  vitamin B-12 (CYANOCOBALAMIN) tablet 1,000 mcg, 1,000 mcg, Oral, Daily, Lanney Gins, Mitchell Iwanicki, MD    ALLERGIES   Antihistamines, chlorpheniramine-type; Codeine; and Paxil [paroxetine]    REVIEW OF SYSTEMS     Unable to obtain due to mechanical ventilation  PHYSICAL EXAMINATION   Vital Signs: Temp:  [97.7 F (36.5 C)-98.8 F (37.1 C)] 98.2 F (36.8 C) (02/09 0828) Pulse  Rate:  [72-93] 72 (02/09 0600) Resp:  [13-26] 13 (02/09 0600) BP: (126-177)/(52-104) 150/59 (02/09 0600) SpO2:  [82 %-100 %] 96 % (02/09 0600) FiO2 (%):  [32 %-50 %] 40 % (02/09 0400) Weight:  [61.9 kg] 61.9 kg (02/09 0500)  GENERAL: Chronically ill-appearing HEAD: Normocephalic, atraumatic.  EYES: Pupils equal, round, reactive to light.  No scleral icterus.  MOUTH: Moist mucosal membrane. NECK: Supple. No thyromegaly. No nodules. No JVD.  PULMONARY: Bibasilar crackles without wheezing CARDIOVASCULAR: S1 and S2. Regular rate and rhythm. No murmurs, rubs, or gallops.  GASTROINTESTINAL: Soft, nontender, non-distended. No masses. Positive bowel sounds. No hepatosplenomegaly.  MUSCULOSKELETAL: No swelling, clubbing, or edema.  NEUROLOGIC: liberated from MV GCS of 10 SKIN:intact,warm,dry   PERTINENT DATA     Infusions: . sodium chloride    . cefTAZidime (FORTAZ)  IV Stopped (06/10/19 1203)  . furosemide (LASIX) infusion 10 mg/hr (06/11/19 0600)   Scheduled Medications: . aspirin  81 mg Oral Daily  . atorvastatin  20 mg Oral q1800  . brimonidine  1 drop Both Eyes BID  . carvedilol  12.5 mg Oral BID  . chlorhexidine gluconate (MEDLINE KIT)  15 mL Mouth Rinse BID  . Chlorhexidine Gluconate Cloth  6 each Topical Daily  . dorzolamide-timolol  1 drop Both Eyes BID  . epoetin (EPOGEN/PROCRIT) injection  10,000 Units Subcutaneous Weekly  . famotidine  20 mg Oral QHS  . insulin aspart  0-9 Units Subcutaneous Q4H  . ipratropium-albuterol  3 mL Nebulization QID  . latanoprost  1 drop Left Eye QHS  . mouth rinse  15 mL Mouth Rinse 10 times per day  . olopatadine  1 drop Both Eyes BID  . potassium chloride  20 mEq Oral Once  . sertraline  25 mg Oral Daily  . sodium chloride flush  3 mL Intravenous Q12H  . vancomycin variable dose per unstable renal function (pharmacist dosing)   Does not apply See admin instructions  . vitamin B-12  1,000 mcg Oral Daily   PRN Medications: sodium  chloride, acetaminophen, ALPRAZolam, bisacodyl, hydrALAZINE, ondansetron (ZOFRAN) IV, sodium chloride flush Hemodynamic parameters:   Intake/Output: 02/08 0701 - 02/09 0700 In: 594.4 [I.V.:288.2; IV Piggyback:306.2] Out: 0254 [YHCWC:[id number]]  Ventilator  Settings: FiO2 (%):  [32 %-50 %] 40 %    LAB RESULTS:  Basic Metabolic Panel: Recent Labs  Lab 06/06/19 0913 06/06/19 2023 06/07/19 0612 06/07/19 0612 06/07/19 8315 06/07/19 1761 06/08/19 0326 06/08/19 0326 06/09/19 0448 06/09/19 0448 06/10/19 0424 06/11/19 0434  NA  --    < > 139   < > 138  --  139  --  139  --  137 142  K  --    < > 4.2   < > 4.2   < > 3.7   < > 3.3*   < > 3.7 3.8  CL  --    < > 103   < > 103  --  104  --  105  --  104 107  CO2  --    < > 25   < > 26  --  24  --  22  --  21* 22  GLUCOSE  --    < > 111*   < > 109*  --  113*  --  116*  --  114* 101*  BUN  --    < > 63*   < > 66*  --  65*  --  66*  --  71* 79*  CREATININE  --    < > 3.81*   < > 3.95*  --  3.75*  --  3.88*  --  4.00* 4.04*  CALCIUM  --    < > 9.5   < > 9.4  --  9.3  --  9.1  --  8.9 9.3  MG 2.5*  --   --   --  2.6*  --  2.4  --   --   --  2.2  --   PHOS  --   --  4.3  --   --   --   --   --   --   --  4.7*  --    < > = values in this interval not displayed.   Liver Function Tests: Recent Labs  Lab 06/05/19 0102 06/06/19 2023 06/07/19 0612  AST 26 26  --   ALT 19 23  --   ALKPHOS 69 68  --   BILITOT 0.6 0.6  --   PROT 6.7 6.3*  --   ALBUMIN 3.6 3.5 3.3*   No  results for input(s): LIPASE, AMYLASE in the last 168 hours. No results for input(s): AMMONIA in the last 168 hours. CBC: Recent Labs  Lab 06/05/19 0647 06/06/19 0913 06/06/19 2023 06/07/19 0629 06/08/19 0326 06/09/19 0448 06/11/19 0434  WBC 5.3   < > 6.0 6.5 6.9 9.1 11.7*  NEUTROABS 4.3  --  5.2  --   --   --   --   HGB 7.1*   < > 8.6* 8.0* 8.8* 8.9* 7.8*  HCT 23.6*   < > 29.0* 25.5* 26.6* 26.9* 25.4*  MCV 88.7   < > 88.4 86.4 82.4 81.0 86.1  PLT 210   < > 267 243  263 250 249   < > = values in this interval not displayed.   Cardiac Enzymes: No results for input(s): CKTOTAL, CKMB, CKMBINDEX, TROPONINI in the last 168 hours. BNP: Invalid input(s): POCBNP CBG: Recent Labs  Lab 06/10/19 1536 06/10/19 1953 06/11/19 0036 06/11/19 0446 06/11/19 0723  GLUCAP 126* 95 92 98 101*     IMAGING RESULTS:  Imaging: DG Chest Port 1 View  Result Date: 06/09/2019 CLINICAL DATA:  Pulmonary disease. EXAM: PORTABLE CHEST 1 VIEW COMPARISON:  June 07, 2019. FINDINGS: Stable cardiomediastinal silhouette. Endotracheal and nasogastric tubes are unchanged in position. Right internal jugular catheter is unchanged. No pneumothorax is noted. Mild central pulmonary vascular congestion is noted. Increased bibasilar atelectasis or edema is noted with small pleural effusions. Bony thorax is unremarkable. IMPRESSION: Stable support apparatus. Increased bibasilar atelectasis or edema is noted with small pleural effusions. Electronically Signed   By: Marijo Conception M.D.   On: 06/09/2019 10:47      ASSESSMENT AND PLAN    -Multidisciplinary rounds held today  Severe acute Hypoxic Respiratory Failure -Due to acute on chronic decompensated diastolic CHF -extubated 10/31/22 -cannot rule out infectious etiology - tracheal aspirate with GPC growing  Empiric abx with ceftaz/doxy -Lasix GTT -Strict I's and O's with Foley catheter -continue Full MV support -continue Bronchodilator Therapy -Wean Fio2 and PEEP as tolerated -will perform SAT/SBT when respiratory parameters are met Net -4.7L -[place] at bedside with minimal pleural effusion  Status post cardiac arrest likely due to respiratory failure with hypoxemia -Cardiology on case appreciate input -Lasix drip currently ICU telemetry monitoring  Renal Failure acute on chronic stage V with proteinuria Nephrology on case-appreciate input -Possible EPO and HD -Plan for subclavian port insertion with vascular surgery -follow  chem 7 -follow UO -continue Foley Catheter-assess need daily   ID -continue IV abx as prescibed -follow up cultures  GI/Nutrition GI PROPHYLAXIS as indicated DIET-->TF's as tolerated Constipation protocol as indicated  ENDO - ICU hypoglycemic\Hyperglycemia protocol -check FSBS per protocol   ELECTROLYTES -follow labs as needed -replace as needed -pharmacy consultation   DVT/GI PRX ordered -SCDs  TRANSFUSIONS AS NEEDED MONITOR FSBS ASSESS the need for LABS as needed   Critical care provider statement:    Critical care time (minutes):  33   Critical care time was exclusive of:  Separately billable procedures and treating other patients   Critical care was necessary to treat or prevent imminent or life-threatening deterioration of the following conditions:   Severe acute hypoxemic respiratory failure, status post cardiac arrest, acute on chronic renal failure, essential hypertension, diabetes mellitus, anemia, hyperparathyroidism, multiple comorbid conditions   Critical care was time spent personally by me on the following activities:  Development of treatment plan with patient or surrogate, discussions with consultants, evaluation of patient's response to treatment, examination of patient, obtaining  history from patient or surrogate, ordering and performing treatments and interventions, ordering and review of laboratory studies and re-evaluation of patient's condition.  I assumed direction of critical care for this patient from another provider in my specialty: no    This document was prepared using Dragon voice recognition software and may include unintentional dictation errors.    Ottie Glazier, M.D.  Division of [place]

## 2019-06-12 ENCOUNTER — Encounter (INDEPENDENT_AMBULATORY_CARE_PROVIDER_SITE_OTHER): Payer: Medicare Other | Admitting: Ophthalmology

## 2019-06-12 DIAGNOSIS — E1169 Type 2 diabetes mellitus with other specified complication: Secondary | ICD-10-CM

## 2019-06-12 DIAGNOSIS — D638 Anemia in other chronic diseases classified elsewhere: Secondary | ICD-10-CM

## 2019-06-12 LAB — CULTURE, RESPIRATORY W GRAM STAIN: Culture: NORMAL

## 2019-06-12 LAB — BASIC METABOLIC PANEL
Anion gap: 7 (ref 5–15)
BUN: 50 mg/dL — ABNORMAL HIGH (ref 8–23)
CO2: 31 mmol/L (ref 22–32)
Calcium: 8.8 mg/dL — ABNORMAL LOW (ref 8.9–10.3)
Chloride: 104 mmol/L (ref 98–111)
Creatinine, Ser: 3 mg/dL — ABNORMAL HIGH (ref 0.44–1.00)
GFR calc Af Amer: 16 mL/min — ABNORMAL LOW (ref >60)
GFR calc non Af Amer: 14 mL/min — ABNORMAL LOW (ref >60)
Glucose, Bld: 129 mg/dL — ABNORMAL HIGH (ref 70–99)
Potassium: 3.3 mmol/L — ABNORMAL LOW (ref 3.5–5.1)
Sodium: 142 mmol/L (ref 135–145)

## 2019-06-12 LAB — CBC WITH DIFFERENTIAL/PLATELET
Abs Immature Granulocytes: 0.04 10*3/uL (ref 0.00–0.07)
Basophils Absolute: 0 10*3/uL (ref 0.0–0.1)
Basophils Relative: 0 %
Eosinophils Absolute: 0.2 10*3/uL (ref 0.0–0.5)
Eosinophils Relative: 2 %
HCT: 26.2 % — ABNORMAL LOW (ref 36.0–46.0)
Hemoglobin: 7.7 g/dL — ABNORMAL LOW (ref 12.0–15.0)
Immature Granulocytes: 0 %
Lymphocytes Relative: 10 %
Lymphs Abs: 0.9 10*3/uL (ref 0.7–4.0)
MCH: 26 pg (ref 26.0–34.0)
MCHC: 29.4 g/dL — ABNORMAL LOW (ref 30.0–36.0)
MCV: 88.5 fL (ref 80.0–100.0)
Monocytes Absolute: 0.9 10*3/uL (ref 0.1–1.0)
Monocytes Relative: 10 %
Neutro Abs: 7.2 10*3/uL (ref 1.7–7.7)
Neutrophils Relative %: 78 %
Platelets: 249 10*3/uL (ref 150–400)
RBC: 2.96 MIL/uL — ABNORMAL LOW (ref 3.87–5.11)
RDW: 14.8 % (ref 11.5–15.5)
WBC: 9.2 10*3/uL (ref 4.0–10.5)
nRBC: 0 % (ref 0.0–0.2)

## 2019-06-12 LAB — MAGNESIUM: Magnesium: 2.1 mg/dL (ref 1.7–2.4)

## 2019-06-12 LAB — GLUCOSE, CAPILLARY
Glucose-Capillary: 113 mg/dL — ABNORMAL HIGH (ref 70–99)
Glucose-Capillary: 134 mg/dL — ABNORMAL HIGH (ref 70–99)
Glucose-Capillary: 335 mg/dL — ABNORMAL HIGH (ref 70–99)
Glucose-Capillary: 271 mg/dL — ABNORMAL HIGH (ref 70–99)

## 2019-06-12 LAB — PHOSPHORUS: Phosphorus: 4 mg/dL (ref 2.5–4.6)

## 2019-06-12 MED ORDER — FUROSEMIDE 10 MG/ML IJ SOLN
80.0000 mg | Freq: Two times a day (BID) | INTRAMUSCULAR | Status: DC
  Administered 2019-06-12 – 2019-06-13 (×3): 80 mg via INTRAVENOUS
  Filled 2019-06-12 (×3): qty 8

## 2019-06-12 NOTE — Progress Notes (Addendum)
Progress Note  Patient Name: Lori Stanley Date of Encounter: 06/12/2019  Primary Cardiologist: Kathlyn Sacramento, MD   Subjective   Patient was extubated yesterday.  States feeling okay, wants to go to the bathroom/have a bowel movement.  No acute events.  Just finished dialysis and feeling okay.  Inpatient Medications    Scheduled Meds: . aspirin  81 mg Oral Daily  . atorvastatin  20 mg Oral q1800  . brimonidine  1 drop Both Eyes BID  . carvedilol  12.5 mg Oral BID  . Chlorhexidine Gluconate Cloth  6 each Topical Daily  . cyanocobalamin  1,000 mcg Intramuscular Q1400  . dorzolamide-timolol  1 drop Both Eyes BID  . epoetin (EPOGEN/PROCRIT) injection  10,000 Units Subcutaneous Weekly  . famotidine  20 mg Oral QHS  . furosemide  80 mg Intravenous BID  . insulin aspart  0-5 Units Subcutaneous QHS  . insulin aspart  0-6 Units Subcutaneous TID WC  . ipratropium-albuterol  3 mL Nebulization QID  . latanoprost  1 drop Left Eye QHS  . mouth rinse  15 mL Mouth Rinse BID  . olopatadine  1 drop Both Eyes BID  . senna-docusate  2 tablet Oral BID  . sertraline  25 mg Oral Daily  . sodium chloride flush  3 mL Intravenous Q12H   Continuous Infusions: . sodium chloride    . cefTAZidime (FORTAZ)  IV Stopped (06/12/19 1020)   PRN Meds: sodium chloride, acetaminophen, ALPRAZolam, bisacodyl, hydrALAZINE, ondansetron (ZOFRAN) IV, sodium chloride flush, traZODone   Vital Signs    Vitals:   06/12/19 1135 06/12/19 1200 06/12/19 1220 06/12/19 1300  BP:  (!) 135/55  (!) 98/44  Pulse:  80  66  Resp:  18  (!) 24  Temp: 97.6 F (36.4 C)     TempSrc: Oral     SpO2:  97% 100% 100%  Weight: 60.2 kg     Height:        Intake/Output Summary (Last 24 hours) at 06/12/2019 1339 Last data filed at 06/12/2019 1135 Gross per 24 hour  Intake 82.29 ml  Output 1665 ml  Net -1582.71 ml   Last 3 Weights 06/12/2019 06/12/2019 06/12/2019  Weight (lbs) 132 lb 11.5 oz 133 lb 9.6 oz 137 lb 5.6 oz   Weight (kg) 60.2 kg 60.6 kg 62.3 kg      Telemetry    Sinus rhythm, heart rate 75- Personally Reviewed  ECG    No new tracing obtained  Physical Exam   GEN: No acute distress.  Soft-spoken, elderly female Neck: No JVD Cardiac: RRR, no murmurs, rubs, or gallops.  Respiratory:  Poor inspiratory effort, decreased breath sounds at bases. GI: Soft, nontender, non-distended  MS: No edema; No deformity. Neuro:  Nonfocal  Psych: Normal affect   Labs    High Sensitivity Troponin:   Recent Labs  Lab 06/05/19 0102 06/05/19 0227 06/06/19 0621 06/06/19 2023 06/06/19 2145  TROPONINIHS 3 4 3 3 4       Chemistry Recent Labs  Lab 06/06/19 2023 06/06/19 2023 06/07/19 0612 06/07/19 0629 06/10/19 0424 06/11/19 0434 06/12/19 0435  NA 137   < > 139   < > 137 142 142  K 4.7   < > 4.2   < > 3.7 3.8 3.3*  CL 103   < > 103   < > 104 107 104  CO2 25   < > 25   < > 21* 22 31  GLUCOSE 147*   < > 111*   < >  114* 101* 129*  BUN 65*   < > 63*   < > 71* 79* 50*  CREATININE 3.74*   < > 3.81*   < > 4.00* 4.04* 3.00*  CALCIUM 9.6   < > 9.5   < > 8.9 9.3 8.8*  PROT 6.3*  --   --   --   --   --   --   ALBUMIN 3.5  --  3.3*  --   --   --   --   AST 26  --   --   --   --   --   --   ALT 23  --   --   --   --   --   --   ALKPHOS 68  --   --   --   --   --   --   BILITOT 0.6  --   --   --   --   --   --   GFRNONAA 10*   < > 10*   < > 10* 9* 14*  GFRAA 12*   < > 12*   < > 11* 11* 16*  ANIONGAP 9   < > 11   < > 12 13 7    < > = values in this interval not displayed.     Hematology Recent Labs  Lab 06/09/19 0448 06/11/19 0434 06/12/19 0435  WBC 9.1 11.7* 9.2  RBC 3.32* 2.95* 2.96*  HGB 8.9* 7.8* 7.7*  HCT 26.9* 25.4* 26.2*  MCV 81.0 86.1 88.5  MCH 26.8 26.4 26.0  MCHC 33.1 30.7 29.4*  RDW 14.5 14.6 14.8  PLT 250 249 249    BNP Recent Labs  Lab 06/06/19 2023  BNP 549.0*     DDimer No results for input(s): DDIMER in the last 168 hours.   Radiology    DG Chest Port 1 View   Result Date: 06/11/2019 CLINICAL DATA:  Shortness of breath. Acute on chronic congestive heart failure. EXAM: PORTABLE CHEST 1 VIEW COMPARISON:  Chest x-rays dated 06/09/2019 and 06/08/2019 FINDINGS: Endotracheal tube and NG tube have been removed. Double lumen central venous catheter is in place, unchanged. Heart size is normal. The pulmonary vascularity has improved. Improved aeration at the right lung base. There is a persistent small left effusion and atelectasis at the left base with minimal residual atelectasis on the right. IMPRESSION: Improving pulmonary vascular congestion. Improved aeration at the right lung base. Aortic Atherosclerosis (ICD10-I70.0). Electronically Signed   By: Lorriane Shire M.D.   On: 06/11/2019 11:00    Cardiac Studies   Echo 06/05/2019 1. Left ventricular ejection fraction, by visual estimation, is 60 to  65%. The left ventricle has normal function. There is moderately increased  left ventricular hypertrophy.  2. The left ventricle has no regional wall motion abnormalities.  3. Left ventricular diastolic parameters are consistent with Grade II  diastolic dysfunction (pseudonormalization).  4. Global right ventricle has normal systolic function.The right  ventricular size is normal. No increase in right ventricular wall  thickness.  5. Left atrial size was severely dilated.  6. Mildly elevated pulmonary artery systolic pressure.  7. The inferior vena cava is dilated in size with <50% respiratory  variability, suggesting right atrial pressure of 15 mmHg.  8. pleural effusion noted on left, 7 cm  9. Small pericardial effusion.   Patient Profile     86 y.o. female with history of heart failure preserved ejection fraction, last EF 60 to  65%, diabetes, CKD being seen for volume overload and diastolic heart failure.  Assessment & Plan    1.  Heart failure preserved ejection fraction. -Patient on Lasix with net -1.3 L. -Continue Lasix to keep patient  net negative. -Continue volume management as per renal. -Okay to decrease or hold Coreg to give patient more blood pressure room for dialysis.  2.  Respiratory failure -Now extubated. -Management as per ICU team  3.  CKD -Dialysis as per renal  Please let us know if further cardiac input is needed.      Signed, Kate Sable, MD  06/12/2019, 1:39 PM

## 2019-06-12 NOTE — Progress Notes (Signed)
CRITICAL CARE PROGRESS NOTE    Name: Lori Stanley MRN: [id number] DOB: December 23, 1931     LOS: 7   SUBJECTIVE FINDINGS & SIGNIFICANT EVENTS   Patient description:  86 yo female admitted to the telemetry unit with acute on chronic hypoxic respiratory failure secondary to acute diastolic CHF exacerbation.  On 02/4 pt respiratory arrested requiring mechanical intubation and transfer to ICU  Lines / Drains: PIV  Cultures / Sepsis markers: COVID negative  Antibiotics: vanco/ceftaz   Protocols / Consultants: Vascular , Nephrology, PCCM, hospitalist  06/08/19 - patient has been diuresing well, we did ultrasound asessment at bedside and pleural effusion is now small.  06/09/19 -patient has thickened airway secretions this am which were sent for micro, empiric abx broadened, attempting weaning SBT trial today.  Discussed care plan with niece [name] today.  06/10/19 -airway secretions are significantly improved. Will perform SBT today and liberate from MV 06/11/19- patient has clinically improved, for swallow eval today, develops atelectasis working on chest PT today, CXR today 06/12/19- participating with RT to use MetaNEB, optimizing medically for medsurg with telemetry. Diet advanced. HD today.   PAST MEDICAL HISTORY   Past Medical History:  Diagnosis Date  . Acute heart failure ([place])   . Chronic low back pain   . Diabetes mellitus without complication ([place])   . Hyperlipidemia   . Hypertension   . MI (myocardial infarction) ([place])   . Obesity      SURGICAL HISTORY   Past Surgical History:  Procedure Laterality Date  . DIALYSIS/PERMA CATHETER INSERTION N/A 06/07/2019   Procedure: DIALYSIS/PERMA CATHETER INSERTION;  Surgeon: Katha Cabal, MD;  Location: [facility];  Service: Cardiovascular;   Laterality: N/A;  . EYE SURGERY    . KNEE SURGERY    . THYROID SURGERY    . VAGINAL HYSTERECTOMY       FAMILY HISTORY   Family History  Problem Relation Age of Onset  . Heart attack Mother   . Heart disease Sister   . Cancer Sister      SOCIAL HISTORY   Social History   Tobacco Use  . Smoking status: Former Smoker    Types: Cigarettes  . Smokeless tobacco: Never Used  . Tobacco comment: Quit in 2015-2016  Substance Use Topics  . Alcohol use: No  . Drug use: No     MEDICATIONS   Current Medication:  Current Facility-Administered Medications:  .  0.9 %  sodium chloride infusion, 250 mL, Intravenous, PRN, Mansy, Jan A, MD .  acetaminophen (TYLENOL) suppository 650 mg, 650 mg, Rectal, Q4H PRN, Awilda Bill, NP .  ALPRAZolam Duanne Moron) tablet 0.25 mg, 0.25 mg, Oral, BID PRN, Awilda Bill, NP, 0.25 mg at 06/11/19 0256 .  aspirin chewable tablet 81 mg, 81 mg, Oral, Daily, Triana Coover, MD .  atorvastatin (LIPITOR) tablet 20 mg, 20 mg, Oral, q1800, Ottie Glazier, MD, 20 mg at 06/11/19 1716 .  bisacodyl (DULCOLAX) suppository 10 mg, 10 mg, Rectal, Daily PRN, Awilda Bill, NP .  brimonidine (ALPHAGAN) 0.2 % ophthalmic solution 1 drop, 1 drop, Both Eyes, BID, Mansy, Jan A, MD, 1 drop at 06/11/19 2202 .  carvedilol (COREG) tablet 12.5 mg, 12.5 mg, Oral, BID, Dametrius Sanjuan, MD, 12.5 mg at 06/11/19 2201 .  cefTAZidime (FORTAZ) 0.5 g in dextrose 5 % 50 mL IVPB, 0.5 g, Intravenous, Q24H, Ottie Glazier, MD, Stopped at 06/11/19 1010 .  Chlorhexidine Gluconate Cloth 2 % PADS 6 each, 6 each, Topical, Daily,  Awilda Bill, NP, 6 each at 06/11/19 1124 .  cyanocobalamin ((VITAMIN B-12)) injection 1,000 mcg, 1,000 mcg, Intramuscular, Q1400, Loletha Grayer, MD, 1,000 mcg at 06/11/19 1620 .  dorzolamide-timolol (COSOPT) 22.3-6.8 MG/ML ophthalmic solution 1 drop, 1 drop, Both Eyes, BID, Mansy, Jan A, MD, 1 drop at 06/11/19 2203 .  epoetin alfa (EPOGEN) injection 10,000  Units, 10,000 Units, Subcutaneous, Weekly, Candiss Norse, [place], MD .  famotidine (PEPCID) tablet 20 mg, 20 mg, Oral, QHS, Sariya Trickey, MD, 20 mg at 06/11/19 2201 .  hydrALAZINE (APRESOLINE) injection 10-20 mg, 10-20 mg, Intravenous, Q4H PRN, Awilda Bill, NP, 10 mg at 06/11/19 0929 .  insulin aspart (novoLOG) injection 0-5 Units, 0-5 Units, Subcutaneous, QHS, Wieting, Richard, MD .  insulin aspart (novoLOG) injection 0-6 Units, 0-6 Units, Subcutaneous, TID WC, Loletha Grayer, MD, 1 Units at 06/11/19 1650 .  ipratropium-albuterol (DUONEB) 0.5-2.5 (3) MG/3ML nebulizer solution 3 mL, 3 mL, Nebulization, QID, Enzo Bi, MD, 3 mL at 06/12/19 0748 .  latanoprost (XALATAN) 0.005 % ophthalmic solution 1 drop, 1 drop, Left Eye, QHS, Mansy, Jan A, MD, 1 drop at 06/11/19 2202 .  MEDLINE mouth rinse, 15 mL, Mouth Rinse, BID, Leslye Peer, Richard, MD, 15 mL at 06/11/19 2204 .  olopatadine (PATANOL) 0.1 % ophthalmic solution 1 drop, 1 drop, Both Eyes, BID, Mansy, Jan A, MD, 1 drop at 06/11/19 2202 .  ondansetron (ZOFRAN) injection 4 mg, 4 mg, Intravenous, Q6H PRN, Mansy, Jan A, MD .  potassium chloride SA (KLOR-CON) CR tablet 20 mEq, 20 mEq, Oral, Once, Visser, Jacquelyn D, PA-C .  senna-docusate (Senokot-S) tablet 2 tablet, 2 tablet, Oral, BID, Charlett Nose, RPH, 2 tablet at 06/11/19 2205 .  sertraline (ZOLOFT) tablet 25 mg, 25 mg, Oral, Daily, Lloyd Cullinan, MD .  sodium chloride flush (NS) 0.9 % injection 3 mL, 3 mL, Intravenous, Q12H, Mansy, Jan A, MD, 3 mL at 06/11/19 2204 .  sodium chloride flush (NS) 0.9 % injection 3 mL, 3 mL, Intravenous, PRN, Mansy, Jan A, MD .  traZODone (DESYREL) tablet 50 mg, 50 mg, Oral, QHS PRN, Awilda Bill, NP, 50 mg at 06/12/19 0027 .  [START ON 06/14/2019] vitamin B-12 (CYANOCOBALAMIN) tablet 1,000 mcg, 1,000 mcg, Oral, Daily, Wieting, Richard, MD    ALLERGIES   Antihistamines, chlorpheniramine-type; Codeine; and Paxil [paroxetine]    REVIEW OF SYSTEMS      Unable to obtain due to mechanical ventilation  PHYSICAL EXAMINATION   Vital Signs: Temp:  [98.1 F (36.7 C)-98.8 F (37.1 C)] 98.5 F (36.9 C) (02/10 0800) Pulse Rate:  [68-96] 80 (02/10 0800) Resp:  [15-28] 22 (02/10 0800) BP: (113-171)/(50-109) 165/63 (02/10 0800) SpO2:  [94 %-100 %] 100 % (02/10 0800) FiO2 (%):  [99 %] 99 % (02/09 2011) Weight:  [61.9 kg-62.3 kg] 62.3 kg (02/10 0500)  GENERAL: Chronically ill-appearing HEAD: Normocephalic, atraumatic.  EYES: Pupils equal, round, reactive to light.  No scleral icterus.  MOUTH: Moist mucosal membrane. NECK: Supple. No thyromegaly. No nodules. No JVD.  PULMONARY: Bibasilar crackles without wheezing CARDIOVASCULAR: S1 and S2. Regular rate and rhythm. No murmurs, rubs, or gallops.  GASTROINTESTINAL: Soft, nontender, non-distended. No masses. Positive bowel sounds. No hepatosplenomegaly.  MUSCULOSKELETAL: No swelling, clubbing, or edema.  NEUROLOGIC: liberated from MV GCS of 10 SKIN:intact,warm,dry   PERTINENT DATA     Infusions: . sodium chloride    . cefTAZidime (FORTAZ)  IV Stopped (06/11/19 1010)   Scheduled Medications: . aspirin  81 mg Oral Daily  . atorvastatin  20 mg  Oral q1800  . brimonidine  1 drop Both Eyes BID  . carvedilol  12.5 mg Oral BID  . Chlorhexidine Gluconate Cloth  6 each Topical Daily  . cyanocobalamin  1,000 mcg Intramuscular Q1400  . dorzolamide-timolol  1 drop Both Eyes BID  . epoetin (EPOGEN/PROCRIT) injection  10,000 Units Subcutaneous Weekly  . famotidine  20 mg Oral QHS  . insulin aspart  0-5 Units Subcutaneous QHS  . insulin aspart  0-6 Units Subcutaneous TID WC  . ipratropium-albuterol  3 mL Nebulization QID  . latanoprost  1 drop Left Eye QHS  . mouth rinse  15 mL Mouth Rinse BID  . olopatadine  1 drop Both Eyes BID  . potassium chloride  20 mEq Oral Once  . senna-docusate  2 tablet Oral BID  . sertraline  25 mg Oral Daily  . sodium chloride flush  3 mL Intravenous Q12H   . [START ON 06/14/2019] vitamin B-12  1,000 mcg Oral Daily   PRN Medications: sodium chloride, acetaminophen, ALPRAZolam, bisacodyl, hydrALAZINE, ondansetron (ZOFRAN) IV, sodium chloride flush, traZODone Hemodynamic parameters:   Intake/Output: 02/09 0701 - 02/10 0700 In: 142 [I.V.:92; IV Piggyback:50] Out: [id number] [Urine:965]  Ventilator  Settings: FiO2 (%):  [99 %] 99 %    LAB RESULTS:  Basic Metabolic Panel: Recent Labs  Lab 06/06/19 0913 06/06/19 2023 06/07/19 0612 06/07/19 0612 06/07/19 [id number] 06/07/19 [id number] 06/08/19 0326 06/08/19 0326 06/09/19 0448 06/09/19 0448 06/10/19 0424 06/10/19 0424 06/11/19 0434 06/12/19 0435  NA  --    < > 139   < > 138   < > 139  --  139  --  137  --  142 142  K  --    < > 4.2   < > 4.2   < > 3.7   < > 3.3*   < > 3.7   < > 3.8 3.3*  CL  --    < > 103   < > 103   < > 104  --  105  --  104  --  107 104  CO2  --    < > 25   < > 26   < > 24  --  22  --  21*  --  22 31  GLUCOSE  --    < > 111*   < > 109*   < > 113*  --  116*  --  114*  --  101* 129*  BUN  --    < > 63*   < > 66*   < > 65*  --  66*  --  71*  --  79* 50*  CREATININE  --    < > 3.81*   < > 3.95*   < > 3.75*  --  3.88*  --  4.00*  --  4.04* 3.00*  CALCIUM  --    < > 9.5   < > 9.4   < > 9.3  --  9.1  --  8.9  --  9.3 8.8*  MG 2.5*  --   --   --  2.6*  --  2.4  --   --   --  2.2  --   --  2.1  PHOS  --   --  4.3  --   --   --   --   --   --   --  4.7*  --   --  4.0   < > = values in  this interval not displayed.   Liver Function Tests: Recent Labs  Lab 06/06/19 2023 06/07/19 0612  AST 26  --   ALT 23  --   ALKPHOS 68  --   BILITOT 0.6  --   PROT 6.3*  --   ALBUMIN 3.5 3.3*   No results for input(s): LIPASE, AMYLASE in the last 168 hours. No results for input(s): AMMONIA in the last 168 hours. CBC: Recent Labs  Lab 06/06/19 2023 06/06/19 2023 06/07/19 0629 06/08/19 0326 06/09/19 0448 06/11/19 0434 06/12/19 0435  WBC 6.0   < > 6.5 6.9 9.1 11.7* 9.2  NEUTROABS 5.2   --   --   --   --   --  7.2  HGB 8.6*   < > 8.0* 8.8* 8.9* 7.8* 7.7*  HCT 29.0*   < > 25.5* 26.6* 26.9* 25.4* 26.2*  MCV 88.4   < > 86.4 82.4 81.0 86.1 88.5  PLT 267   < > 243 263 250 249 249   < > = values in this interval not displayed.   Cardiac Enzymes: No results for input(s): CKTOTAL, CKMB, CKMBINDEX, TROPONINI in the last 168 hours. BNP: Invalid input(s): POCBNP CBG: Recent Labs  Lab 06/11/19 1129 06/11/19 1633 06/11/19 1939 06/11/19 2149 06/12/19 0718  GLUCAP 105* 192* 126* 145* 113*     IMAGING RESULTS:  Imaging: DG Chest Port 1 View  Result Date: 06/11/2019 CLINICAL DATA:  Shortness of breath. Acute on chronic congestive heart failure. EXAM: PORTABLE CHEST 1 VIEW COMPARISON:  Chest x-rays dated 06/09/2019 and 06/08/2019 FINDINGS: Endotracheal tube and NG tube have been removed. Double lumen central venous catheter is in place, unchanged. Heart size is normal. The pulmonary vascularity has improved. Improved aeration at the right lung base. There is a persistent small left effusion and atelectasis at the left base with minimal residual atelectasis on the right. IMPRESSION: Improving pulmonary vascular congestion. Improved aeration at the right lung base. Aortic Atherosclerosis (ICD10-I70.0). Electronically Signed   By: Lorriane Shire M.D.   On: 06/11/2019 11:00      ASSESSMENT AND PLAN    -Multidisciplinary rounds held today  Severe acute Hypoxic Respiratory Failure -Due to acute on chronic decompensated diastolic CHF -extubated [id number] -cannot rule out infectious etiology - tracheal aspirate with GPC growing  Empiric abx with ceftaz/doxy -Lasix drip stopped, now on once daily regimen -Strict I's and O's with Foley catheter Net -7.7L    Status post cardiac arrest likely due to respiratory failure with hypoxemia -Cardiology on case appreciate input -Lasix drip currently ICU telemetry monitoring   Renal Failure acute on chronic stage V with  proteinuria Nephrology on case-appreciate input -Possible EPO and HD -Plan for subclavian port insertion with vascular surgery -follow chem 7 -follow UO -continue Foley Catheter-assess need daily   ID -continue IV abx as prescibed -follow up cultures  GI/Nutrition GI PROPHYLAXIS as indicated DIET-->TF's as tolerated Constipation protocol as indicated  ENDO - ICU hypoglycemic\Hyperglycemia protocol -check FSBS per protocol   ELECTROLYTES -follow labs as needed -replace as needed -pharmacy consultation   DVT/GI PRX ordered -SCDs  TRANSFUSIONS AS NEEDED MONITOR FSBS ASSESS the need for LABS as needed   Critical care provider statement:    Critical care time (minutes):  33   Critical care time was exclusive of:  Separately billable procedures and treating other patients   Critical care was necessary to treat or prevent imminent or life-threatening deterioration of the following conditions:   Severe acute hypoxemic respiratory failure,  status post cardiac arrest, acute on chronic renal failure, essential hypertension, diabetes mellitus, anemia, hyperparathyroidism, multiple comorbid conditions   Critical care was time spent personally by me on the following activities:  Development of treatment plan with patient or surrogate, discussions with consultants, evaluation of patient's response to treatment, examination of patient, obtaining history from patient or surrogate, ordering and performing treatments and interventions, ordering and review of laboratory studies and re-evaluation of patient's condition.  I assumed direction of critical care for this patient from another provider in my specialty: no    This document was prepared using Dragon voice recognition software and may include unintentional dictation errors.    Ottie Glazier, M.D.  Division of [facility]

## 2019-06-12 NOTE — Progress Notes (Signed)
[facility], [place] 06/12/19  Subjective:   Hospital day # 7 Patient underwent 1st HD yesterday Tolerated it well Still some SOB this morning     Objective:  Vital signs in last 24 hours:  Temp:  [98.1 F (36.7 C)-98.8 F (37.1 C)] 98.5 F (36.9 C) (02/10 0800) Pulse Rate:  [68-96] 80 (02/10 0800) Resp:  [15-28] 22 (02/10 0800) BP: (113-171)/(50-109) 165/63 (02/10 0800) SpO2:  [94 %-100 %] 100 % (02/10 0800) FiO2 (%):  [99 %] 99 % (02/09 2011) Weight:  [61.9 kg-62.3 kg] 62.3 kg (02/10 0500)  Weight change: 0 kg Filed Weights   06/11/19 0500 06/11/19 1900 06/12/19 0500  Weight: 61.9 kg 61.9 kg 62.3 kg    Intake/Output:    Intake/Output Summary (Last 24 hours) at 06/12/2019 0904 Last data filed at 06/12/2019 0800 Gross per 24 hour  Intake 121.96 ml  Output 1290 ml  Net -1168.04 ml     Physical Exam: General:  Chronically ill-appearing, laying in the bed  HEENT  anicteric, moist oral membranes  Pulm/lungs   NCO2, coarse breath sounds, mild crackles  CVS/Heart  regular, no rub  Abdomen:   Soft  Extremities:  Peripheral edema present  Neurologic:  Alert and able to follow simple commands  Skin:  Warm  Access:  Right IJ PermCath       Basic Metabolic Panel:  Recent Labs  Lab 06/06/19 0913 06/06/19 2023 06/07/19 0612 06/07/19 [id number] 06/07/19 [id number] 06/07/19 [id number] 06/08/19 0326 06/08/19 0326 06/09/19 0448 06/09/19 0448 06/10/19 0424 06/11/19 0434 06/12/19 0435  NA  --    < > 139   < > 138   < > 139  --  139  --  137 142 142  K  --    < > 4.2   < > 4.2   < > 3.7  --  3.3*  --  3.7 3.8 3.3*  CL  --    < > 103   < > 103   < > 104  --  105  --  104 107 104  CO2  --    < > 25   < > 26   < > 24  --  22  --  21* 22 31  GLUCOSE  --    < > 111*   < > 109*   < > 113*  --  116*  --  114* 101* 129*  BUN  --    < > 63*   < > 66*   < > 65*  --  66*  --  71* 79* 50*  CREATININE  --    < > 3.81*   < > 3.95*   < > 3.75*  --  3.88*  --  4.00*  4.04* 3.00*  CALCIUM  --    < > 9.5   < > 9.4   < > 9.3   < > 9.1   < > 8.9 9.3 8.8*  MG 2.5*  --   --   --  2.6*  --  2.4  --   --   --  2.2  --  2.1  PHOS  --   --  4.3  --   --   --   --   --   --   --  4.7*  --  4.0   < > = values in this interval not displayed.     CBC: Recent Labs  Lab 06/06/19 2023  06/06/19 2023 06/07/19 0629 06/08/19 0326 06/09/19 0448 06/11/19 0434 06/12/19 0435  WBC 6.0   < > 6.5 6.9 9.1 11.7* 9.2  NEUTROABS 5.2  --   --   --   --   --  7.2  HGB 8.6*   < > 8.0* 8.8* 8.9* 7.8* 7.7*  HCT 29.0*   < > 25.5* 26.6* 26.9* 25.4* 26.2*  MCV 88.4   < > 86.4 82.4 81.0 86.1 88.5  PLT 267   < > 243 263 250 249 249   < > = values in this interval not displayed.      Lab Results  Component Value Date   HEPBSAG NON REACTIVE 06/07/2019   HEPBSAB NON REACTIVE 06/07/2019   HEPBIGM NON REACTIVE 06/07/2019      Microbiology:  Recent Results (from the past 240 hour(s))  Respiratory Panel by RT PCR (Flu A&B, Covid) - Nasopharyngeal Swab     Status: None   Collection Time: 06/05/19  2:27 AM   Specimen: Nasopharyngeal Swab  Result Value Ref Range Status   SARS Coronavirus 2 by RT PCR NEGATIVE NEGATIVE Final    Comment: (NOTE) SARS-CoV-2 target nucleic acids are NOT DETECTED. The SARS-CoV-2 RNA is generally detectable in upper respiratoy specimens during the acute phase of infection. The lowest concentration of SARS-CoV-2 viral copies this assay can detect is 131 copies/mL. A negative result does not preclude SARS-Cov-2 infection and should not be used as the sole basis for treatment or other patient management decisions. A negative result may occur with  improper specimen collection/handling, submission of specimen other than nasopharyngeal swab, presence of viral mutation(s) within the areas targeted by this assay, and inadequate number of viral copies (<131 copies/mL). A negative result must be combined with clinical observations, patient history, and  epidemiological information. The expected result is Negative. Fact Sheet for Patients:  PinkCheek.be Fact Sheet for Healthcare Providers:  GravelBags.it This test is not yet ap proved or cleared by the [place] FDA and  has been authorized for detection and/or diagnosis of SARS-CoV-2 by FDA under an Emergency Use Authorization (EUA). This EUA will remain  in effect (meaning this test can be used) for the duration of the COVID-19 declaration under Section 564(b)(1) of the Act, 21 U.S.C. section 360bbb-3(b)(1), unless the authorization is terminated or revoked sooner.    Influenza A by PCR NEGATIVE NEGATIVE Final   Influenza B by PCR NEGATIVE NEGATIVE Final    Comment: (NOTE) The Xpert Xpress SARS-CoV-2/FLU/RSV assay is intended as an aid in  the diagnosis of influenza from Nasopharyngeal swab specimens and  should not be used as a sole basis for treatment. Nasal washings and  aspirates are unacceptable for Xpert Xpress SARS-CoV-2/FLU/RSV  testing. Fact Sheet for Patients: PinkCheek.be Fact Sheet for Healthcare Providers: GravelBags.it This test is not yet approved or cleared by the [place] FDA and  has been authorized for detection and/or diagnosis of SARS-CoV-2 by  FDA under an Emergency Use Authorization (EUA). This EUA will remain  in effect (meaning this test can be used) for the duration of the  Covid-19 declaration under Section 564(b)(1) of the Act, 21  U.S.C. section 360bbb-3(b)(1), unless the authorization is  terminated or revoked. Performed at [facility], [address] [place], [place] [address]   MRSA PCR Screening     Status: None   Collection Time: 06/07/19 12:53 AM   Specimen: Nasopharyngeal  Result Value Ref Range Status   MRSA by PCR NEGATIVE NEGATIVE Final  Comment:        The GeneXpert MRSA Assay (FDA approved for  NASAL specimens only), is one component of a comprehensive MRSA colonization surveillance program. It is not intended to diagnose MRSA infection nor to guide or monitor treatment for MRSA infections. Performed at [facility], [address] [place], [place] [address]   Culture, respiratory (non-expectorated)     Status: None   Collection Time: 06/09/19 10:11 AM   Specimen: Tracheal Aspirate; Respiratory  Result Value Ref Range Status   Specimen Description   Final    TRACHEAL ASPIRATE Performed at [facility], [address], [place], [place] [address]    Special Requests   Final    NONE Performed at [facility], [address] [place], [place] [address]    Gram Stain   Final    MODERATE WBC PRESENT, PREDOMINANTLY PMN MODERATE GRAM POSITIVE COCCI IN PAIRS IN CHAINS FEW GRAM VARIABLE ROD    Culture   Final    FEW Consistent with normal respiratory flora. Performed at [facility], [place] [address], [place],  [address]    Report Status 06/12/2019 FINAL  Final    Coagulation Studies: No results for input(s): LABPROT, INR in the last 72 hours.  Urinalysis: No results for input(s): COLORURINE, LABSPEC, PHURINE, GLUCOSEU, HGBUR, BILIRUBINUR, KETONESUR, PROTEINUR, UROBILINOGEN, NITRITE, LEUKOCYTESUR in the last 72 hours.  Invalid input(s): APPERANCEUR    Imaging: DG Chest Port 1 View  Result Date: 06/11/2019 CLINICAL DATA:  Shortness of breath. Acute on chronic congestive heart failure. EXAM: PORTABLE CHEST 1 VIEW COMPARISON:  Chest x-rays dated 06/09/2019 and 06/08/2019 FINDINGS: Endotracheal tube and NG tube have been removed. Double lumen central venous catheter is in place, unchanged. Heart size is normal. The pulmonary vascularity has improved. Improved aeration at the right lung base. There is a persistent small left effusion and atelectasis at the left base with minimal residual atelectasis on the right. IMPRESSION: Improving  pulmonary vascular congestion. Improved aeration at the right lung base. Aortic Atherosclerosis (ICD10-I70.0). Electronically Signed   By: Lorriane Shire M.D.   On: 06/11/2019 11:00     Medications:   . sodium chloride    . cefTAZidime (FORTAZ)  IV Stopped (06/11/19 1010)   . aspirin  81 mg Oral Daily  . atorvastatin  20 mg Oral q1800  . brimonidine  1 drop Both Eyes BID  . carvedilol  12.5 mg Oral BID  . Chlorhexidine Gluconate Cloth  6 each Topical Daily  . cyanocobalamin  1,000 mcg Intramuscular Q1400  . dorzolamide-timolol  1 drop Both Eyes BID  . epoetin (EPOGEN/PROCRIT) injection  10,000 Units Subcutaneous Weekly  . famotidine  20 mg Oral QHS  . insulin aspart  0-5 Units Subcutaneous QHS  . insulin aspart  0-6 Units Subcutaneous TID WC  . ipratropium-albuterol  3 mL Nebulization QID  . latanoprost  1 drop Left Eye QHS  . mouth rinse  15 mL Mouth Rinse BID  . olopatadine  1 drop Both Eyes BID  . potassium chloride  20 mEq Oral Once  . senna-docusate  2 tablet Oral BID  . sertraline  25 mg Oral Daily  . sodium chloride flush  3 mL Intravenous Q12H  . [START ON 06/14/2019] vitamin B-12  1,000 mcg Oral Daily   sodium chloride, acetaminophen, ALPRAZolam, bisacodyl, hydrALAZINE, ondansetron (ZOFRAN) IV, sodium chloride flush, traZODone  Assessment/ Plan:  86 y.o.African [place] female with diabetes mellitus type II, hypertension, anemia, GERD, depression,  anxiety, glaucoma, hyperlipidemia, diastolic congestive heart failure, coronary artery disease admitted on 06/05/2019 for SOB (shortness of breath) [R06.02] Acute CHF (congestive heart failure) (HCC) [I50.9] Acute respiratory failure with hypoxia (HCC) [J96.01] Heart failure (HCC) [I50.9] Acute on chronic congestive heart failure, unspecified heart failure type ([place]) [I50.9]   1.  End-stage renal disease Chronic kidney disease is likely secondary to diabetic nephropathy and hypertensive nephrosclerosis.  This admission,  patient presented via EMS from home for respiratory distress, shortness of breath, worsening lower extremity edema.  She has failed outpatient volume management. Dialysis catheter was placed this admission Overall patient's clinical condition has improved.  She is now extubated  HD - second treatment today Outpatient placement in progress  2.  Acute respiratory failure Hospital course complicated by respiratory arrest the night of February 4  Responded well to furosemide infusion, change to iv lasix for few days then will change to oral torsemide Volume status appears to have optimized Extubated February 8  3. Diabetes type 2 with CKD Lab Results  Component Value Date   HGBA1C 6.0 (H) 04/10/2019   4. Anemia of CKD -  Lab Results  Component Value Date   HGB 7.7 (L) 06/12/2019   EPO SQ, saturday  5. [facility] Lab Results  Component Value Date   PTH 148 (H) 06/07/2019   CALCIUM 8.8 (L) 06/12/2019   PHOS 4.0 06/12/2019   Monitor phos     LOS: [place] 2/10/20219:04 AM  [place], [place]  Note: This note was prepared with Dragon dictation. Any transcription errors are unintentional

## 2019-06-12 NOTE — Progress Notes (Signed)
OT Cancellation Note  Patient Details Name: Lori Stanley MRN: [id number] DOB: Jun 11, 1931   Cancelled Treatment:    Reason Eval/Treat Not Completed: Patient at procedure or test/ unavailable. Order received and chart reviewed. Per pt primary RN, pt in Dialysis for the next 3 hours. Will follow remotely and re-attempt OT evaluation at a later time/date as available and pt medically appropriate for OT services.   Shara Blazing, M.S., OTR/L Ascom: [phone] 06/12/19, 9:09 AM

## 2019-06-12 NOTE — Evaluation (Signed)
Occupational Therapy Evaluation Patient Details Name: Lori Stanley MRN: [id number] DOB: [birth year] Today's Date: 06/12/2019    History of Present Illness pt is an 86 yo female admitted to telemetry unit for acute on chronic hypoxic respiratory failure, pt respiratory arrest on 2/4 requiring mechanical intubation and transfer to ICU. PMH includes CHF, HTN, CAD, DM, MI   Clinical Impression   Pt was seen for OT evaluation this date. Prior to hospital admission, pt reports being mostly Indep with ADLs, some assist from niece for IADLs. Pt lives alone in [facility] with 3-4 STE. States her niece can come stay with her upon d/c some, but works during the day. Currently pt demonstrates impairments as described below (See OT problem list) which functionally limit her ability to perform ADL/self-care tasks. Pt currently requires MIN A for ADL transfers with RW, MIN A for UB ADLs d/t limited UE ROM, and MOD A for LB ADLs.  Pt would benefit from skilled OT to address noted impairments and functional limitations (see below for any additional details) in order to maximize safety and independence while minimizing falls risk and caregiver burden. Upon hospital discharge, recommend STR to maximize pt safety and return to PLOF.     Follow Up Recommendations  SNF    Equipment Recommendations  Other (comment)(defer to next level of care)    Recommendations for Other Services       Precautions / Restrictions Precautions Precautions: Fall Restrictions Weight Bearing Restrictions: No      Mobility Bed Mobility Overal bed mobility: Needs Assistance Bed Mobility: Supine to Sit     Supine to sit: Supervision;HOB elevated     General bed mobility comments: recieved sitting up in chair  Transfers Overall transfer level: Needs assistance Equipment used: Rolling walker (2 wheeled) Transfers: Sit to/from Stand Sit to Stand: Min assist Stand pivot transfers: Mod assist;Max assist       General  transfer comment: Pt requires MIN verbal cues to sequence hand placement relative to RW    Balance Overall balance assessment: Needs assistance Sitting-balance support: Feet supported Sitting balance-Leahy Scale: Good Sitting balance - Comments: pt min guard maintaining sitting EOB Postural control: Posterior lean Standing balance support: Bilateral upper extremity supported;During functional activity Standing balance-Leahy Scale: Poor Standing balance comment: Requires MIN A for static standing balance and UE support through RW.                           ADL either performed or assessed with clinical judgement   ADL                                         General ADL Comments: Pt requires MOD A for sit/lateral lean for LB dressing, requires MIN A for UB ADLs including self-feeding, grooming, and dressing d/t limited ROM. Requires MIN A with RW for ADL transfers.     Vision Patient Visual Report: No change from baseline       Perception     Praxis      Pertinent Vitals/Pain Pain Assessment: No/denies pain     Hand Dominance     Extremity/Trunk Assessment Upper Extremity Assessment Upper Extremity Assessment: Generalized weakness;RUE deficits/detail;LUE deficits/detail RUE Deficits / Details: shld flex/abd 1/3 arc & MMT 3-/5, elbow & grip 3+/5 LUE Deficits / Details: shld flex/abd 1/3 arc & MMT 3-/5, elbow & grip 3+/5  Lower Extremity Assessment Lower Extremity Assessment: Defer to PT evaluation;Generalized weakness       Communication Communication Communication: No difficulties   Cognition Arousal/Alertness: Awake/alert Behavior During Therapy: WFL for tasks assessed/performed Overall Cognitive Status: Within Functional Limits for tasks assessed                                     General Comments       Exercises Other Exercises Other Exercises: OT facilitates education re: role of OT in acute setting, fall  prevention considerations including encouraging call light use. Pt verbalized understanding.   Shoulder Instructions      Home Living Family/patient expects to be discharged to:: Private residence Living Arrangements: Alone Available Help at Discharge: Family;Other (Comment);Available PRN/intermittently Type of Home: House Home Access: Stairs to enter [employer] of Steps: 3-4 Entrance Stairs-Rails: Left;Right Home Layout: One level     Bathroom Shower/Tub: Teacher, early years/pre: Handicapped height     Home Equipment: Environmental consultant - 2 wheels   Additional Comments: pt reports needing a shower chair      Prior Functioning/Environment Level of Independence: Independent        Comments: pt reports being independent with ADLs, niece stays with her some pt not able to specify how often and how niece specifically helps, pt reports cooking once in a while, denies any recent falls        OT Problem List: Decreased strength;Decreased range of motion;Decreased activity tolerance;Impaired balance (sitting and/or standing)      OT Treatment/Interventions: Self-care/ADL training;Therapeutic exercise;Energy conservation;DME and/or AE instruction;Therapeutic activities;Patient/family education;Balance training    OT Goals(Current goals can be found in the care plan section) Acute Rehab OT Goals Patient Stated Goal: get stronger OT Goal Formulation: With patient Time For Goal Achievement: 06/26/19 Potential to Achieve Goals: Good ADL Goals Pt Will Perform Lower Body Dressing: with min guard assist;sitting/lateral leans Pt Will Transfer to Toilet: with min guard assist;stand pivot transfer;bedside commode Pt Will Perform Toileting - Clothing Manipulation and hygiene: with min guard assist;sitting/lateral leans  OT Frequency: Min 2X/week   Barriers to D/C:            Co-evaluation              AM-PAC OT "6 Clicks" Daily Activity     Outcome Measure Help  from another person eating meals?: A Little Help from another person taking care of personal grooming?: A Little Help from another person toileting, which includes using toliet, bedpan, or urinal?: A Lot Help from another person bathing (including washing, rinsing, drying)?: A Lot Help from another person to put on and taking off regular upper body clothing?: A Little Help from another person to put on and taking off regular lower body clothing?: A Lot 6 Click Score: 15   End of Session Equipment Utilized During Treatment: Gait belt;Rolling walker Nurse Communication: Mobility status  Activity Tolerance: Patient tolerated treatment well Patient left: in chair;with call bell/phone within reach  OT Visit Diagnosis: Unsteadiness on feet (R26.81);Muscle weakness (generalized) (M62.81)                Time: [id number] OT Time Calculation (min): 38 min Charges:  OT General Charges $OT Visit: 1 Visit OT Evaluation $OT Eval Moderate Complexity: 1 Mod OT Treatments $Self Care/Home Management : 8-22 mins $Therapeutic Activity: 8-22 mins  Gerrianne Scale, MS, OTR/L ascom [phone] 06/12/19, 5:24 PM

## 2019-06-12 NOTE — TOC Progression Note (Addendum)
Transition of Care [facility]) - Progression Note    Patient Details  Name: Lori Stanley MRN: [id number] Date of Birth: 1932/04/12  [facility]) CM/SW [place], LCSW Phone Number: 06/12/2019, 1:37 PM  Clinical Narrative:   CSW met with patient to discuss SNF recommendation. She is not interested in placement but agreeable to home health. [facility] is reviewing referral. Tried calling niece but phone hung up. Will try again later once more information received from home health agencies.  3:28 pm: [facility] is able to accept patient for PT, OT, RN, aide, and SW services. Patient has a rolling walker at home. Will follow for further DME recommendations. Patient said her friend will transport her to HD once she is home.  Expected Discharge Plan: [place]    Expected Discharge Plan and Services Expected Discharge Plan: [place]       Living arrangements for the past 2 months: Single Family Home                                       Social Determinants of Health (SDOH) Interventions    Readmission Risk Interventions No flowsheet data found.

## 2019-06-12 NOTE — Consult Note (Signed)
PHARMACY CONSULT NOTE - FOLLOW UP  Pharmacy Consult for Electrolyte Monitoring and Replacement   Recent Labs: Potassium (mmol/L)  Date Value  06/12/2019 3.3 (L)  05/15/2013 4.0   Magnesium (mg/dL)  Date Value  06/12/2019 2.1   Calcium (mg/dL)  Date Value  06/12/2019 8.8 (L)   Calcium, Total (mg/dL)  Date Value  05/15/2013 8.0 (L)   Albumin (g/dL)  Date Value  06/07/2019 3.3 (L)  04/10/2019 4.1  05/11/2013 3.6   Phosphorus (mg/dL)  Date Value  06/12/2019 4.0   Sodium (mmol/L)  Date Value  06/12/2019 142  04/10/2019 143  05/15/2013 52 (L)     86 year-old female admitted with acute hypoxic respiratory failure, heart failure, and ESRD.  PMH significant for CHF (LVEF 60-65% in 2/21), MI, diabetes mellitus, hyperlipidemia, HTN, and obesity.  On 2/4, she respiratory arrested.  Code blue was called requiring mechanical intubation and transfer to the ICU.  On 2/8, she was extubated.  She was on lasix gtt, which has now been converted to lasix 80 mg IV BID.  Hemodialysis was initiated 2/9.  Assessment/Plan:  Given cardiac history, goal of therapy is potassium ~4 mmol/L and magnesium ~2 mg/dL.  All other electrolytes WNL.   Will defer to nephrology for electrolyte replenishment.   Patient has transferred to stepdown unit and is extubated.  Pharmacy will sign off at this time, and follow peripherally.  Gerald Dexter ,PharmD 06/12/2019 10:59 AM

## 2019-06-12 NOTE — Progress Notes (Signed)
  Speech Language Pathology Treatment: Dysphagia  Patient Details Name: Lori Stanley MRN: [id number] DOB: 1931/10/17 Today's Date: 06/12/2019 Time: 0828-0905 SLP Time Calculation (min) (ACUTE ONLY): 37 min  Assessment / Plan / Recommendation Clinical Impression  Pt seen today for ongoing assessment of swallowing; toleration of diet and trials to upgrade consistency when appropriate. Pt was awake/alert; feeding self breakfast meal in progress. Pt is on a dysphagia level 2 w/ Nectar liquids. Pt was verbal; also noted min fatigued looking w/ the exertion of self-feeding -- pt was also on Dialysis at the time.  Pt needed min positioning for better support upright in bed as she fed herself. She consumed trials of soft solid foods(cut well) and Nectar liquids via Cup w/ No overt clinical s/s of aspiration noted; no decline in vocal quality or gross decline in respiratory status -- pt was encouraged to take her time w/ small, single bites AND fully clearing her mouth b/t bites. HR remained grossly at baseline; RR slightly increased w/ the exertion - 24-26; O2 sats 97%. Encouraged Rest Breaks b/t bites also to regulate and calm breathing w/ oral intake -- pt appeared easily fatigued w/ the exertion of self-feeding, eating/drinking, and HD ongoing. During the oral phase, pt exhibited adequate bolus management and oral clearing given time. Grossly timely A-P transfer noted w/ all bolus consistencies -- pt alternated foods/liquids to aid clearing. Pt appears at Reduced risk for aspiration when following general aspiration precautions. With pt on HD currently, and pt on increased O2 support via HFNC (and on HD currently), no trials of thin liquids or increased textured foods were attempted at this session. ST services will f/u tomorrow w/ trials to upgrade diet as appropriate.  Recommend continue Dysphagia level 2 (minced foods for ease of intake/eating) w/ Nectar liquids; Pills Whole in a Puree such as yogurt,  applesauce. General aspiration precautions. Tray setup at meals. NSG updated.      HPI HPI: Pt is a 86 y.o. female with a known history of CHF, hypertension, dyslipidemia and coronary artery disease, presented to the emergency room with an onset of worsening dyspnea over the last few days with associated dry cough as well as worsening lower extremity edema, orthopnea and occasional paroxysmal nocturnal dyspnea. Pt intubated 06/06/19 and transferred to the ICU. Pt extubated 06/10/19. Per CXR yest., Improving pulmonary vascular congestion. Improved aeration at the R lung base.       SLP Plan  Continue with current plan of care(trials to upgrade diet consistency tomorrow)       Recommendations  Diet recommendations: Dysphagia 2 (fine chop);Nectar-thick liquid Liquids provided via: Cup;Straw Medication Administration: Whole meds with puree(for safer swallowing) Supervision: Patient able to self feed;Intermittent supervision to cue for compensatory strategies Compensations: Minimize environmental distractions;Slow rate;Small sips/bites;Lingual sweep for clearance of pocketing;Multiple dry swallows after each bite/sip;Follow solids with liquid Postural Changes and/or Swallow Maneuvers: Seated upright 90 degrees;Upright 30-60 min after meal                General recommendations: (Dietician f/u for support) Oral Care Recommendations: Oral care BID;Oral care before and after PO;Staff/trained caregiver to provide oral care Follow up Recommendations: Skilled Nursing facility(TBD) SLP Visit Diagnosis: Dysphagia, oropharyngeal phase (R13.12) Plan: Continue with current plan of care(trials to upgrade diet consistency tomorrow)       GO                 Orinda Kenner, [place], CCC-SLP Lori Stanley 06/12/2019, 10:52 AM

## 2019-06-12 NOTE — Progress Notes (Signed)
Physical Therapy Treatment Patient Details Name: Lori Stanley MRN: [id number] DOB: 1931/11/15 Today's Date: 06/12/2019    History of Present Illness pt is an 86 yo female admitted to telemetry unit for acute on chronic hypoxic respiratory failure, pt respiratory arrest on 2/4 requiring mechanical intubation and transfer to ICU. PMH includes CHF, HTN, CAD, DM, MI    PT Comments    Patient alert, oriented, eager to get up out of bed. The patient demonstrated good progress towards goals this session. Supine to sit with HOB elevated and bed surface firmed with supervision/CGA. Sit <> stand performed from EOB and from recliner, improved steadiness noted with bilateral UE use on recliner arms, but patient does require at least modA for complete standing and standing balance. Pt transferred to chair, and also ambulated a few feet in the room this session. The patient exhibited difficulty with RW management as well as posterior lean that required a mod-max assistance and close chair follow. The patient was up in chair, all needs in reach at end of session. RN notified that patient was having difficulty reaching table to successfully eat lunch. Current recommendation remains appropriate due to decline in functional mobility and current level of assistance needed, though pt is eager to go home.      Follow Up Recommendations  SNF     Equipment Recommendations  Other (comment)(TBD)    Recommendations for Other Services       Precautions / Restrictions Precautions Precautions: Fall Restrictions Weight Bearing Restrictions: No    Mobility  Bed Mobility Overal bed mobility: Needs Assistance Bed Mobility: Supine to Sit     Supine to sit: Supervision;HOB elevated        Transfers Overall transfer level: Needs assistance Equipment used: Rolling walker (2 wheeled) Transfers: Sit to/from Stand Sit to Stand: Mod assist Stand pivot transfers: Mod assist;Max assist       General  transfer comment: Pt needed assistance with RW management, at least mod-maxA assistance due to posterior lean noted in standing. Cues for handplacement, decreased physical assistance needed to rise from recliner.  Ambulation/Gait   Gait Distance (Feet): 3 Feet Assistive device: Rolling walker (2 wheeled)   Gait velocity: decreased   General Gait Details: Pt with significant posterior lean, often too far forward in RW. modA increased to maxA to take several steps forward.   Stairs             Wheelchair Mobility    Modified Rankin (Stroke Patients Only)       Balance Overall balance assessment: Needs assistance Sitting-balance support: Feet supported Sitting balance-Leahy Scale: Fair Sitting balance - Comments: pt min guard maintaining sitting EOB   Standing balance support: Bilateral upper extremity supported;During functional activity Standing balance-Leahy Scale: Poor                              Cognition Arousal/Alertness: Awake/alert Behavior During Therapy: WFL for tasks assessed/performed Overall Cognitive Status: Within Functional Limits for tasks assessed                                        Exercises General Exercises - Lower Extremity Ankle Circles/Pumps: AROM;Both;5 reps Heel Slides: AROM;Strengthening;Both;5 reps Hip ABduction/ADduction: AROM;Strengthening;Both;10 reps Straight Leg Raises: AROM;Strengthening;Both;10 reps    General Comments        Pertinent Vitals/Pain Pain Assessment: No/denies pain  Home Living                      Prior Function            PT Goals (current goals can now be found in the care plan section) Progress towards PT goals: Progressing toward goals    Frequency    Min 2X/week      PT Plan Current plan remains appropriate    Co-evaluation              AM-PAC PT "6 Clicks" Mobility   Outcome Measure  Help needed turning from your back to your side  while in a flat bed without using bedrails?: A Little Help needed moving from lying on your back to sitting on the side of a flat bed without using bedrails?: A Little Help needed moving to and from a bed to a chair (including a wheelchair)?: A Lot Help needed standing up from a chair using your arms (e.g., wheelchair or bedside chair)?: A Lot Help needed to walk in hospital room?: A Lot Help needed climbing 3-5 steps with a railing? : Total 6 Click Score: 13    End of Session Equipment Utilized During Treatment: Gait belt;Oxygen Activity Tolerance: Patient tolerated treatment well Patient left: in chair;with call bell/phone within reach;with SCD's reapplied Nurse Communication: Mobility status PT Visit Diagnosis: Unsteadiness on feet (R26.81);Muscle weakness (generalized) (M62.81);Difficulty in walking, not elsewhere classified (R26.2)     Time: [id number] PT Time Calculation (min) (ACUTE ONLY): 30 min  Charges:  $Therapeutic Exercise: 23-37 mins                    Lieutenant Diego PT, DPT 3:49 PM,06/12/19

## 2019-06-12 NOTE — Progress Notes (Signed)
PROGRESS NOTE    Lori Stanley  [id number] DOB: 14-Aug-1931 DOA: 06/05/2019 PCP: Jerrol Banana., MD   Brief Narrative: HPI: Lori Stanley  is a 86 y.o. female with a known history of CHF, hypertension, dyslipidemia and coronary artery disease, presented to the emergency room with an onset of worsening dyspnea over the last few days with associated dry cough as well as worsening lower extremity edema, orthopnea and occasional paroxysmal nocturnal dyspnea.  No dysuria, oliguria or hematuria or flank pain.  No chest pain or palpitations.  No headache or dizziness or blurred vision.  Upon presentation to the emergency room, blood pressure was 166/65 with respirate of 28 and pulse symmetry was 81% on 2 L of O2 by nasal cannula when he was placed on BiPAP with pulse oximetry improvement 100%.  Labs revealed a BUN of 61 and creatinine 3.86 compared to 40 and 3.2 on 04/10/2019.  BNP was 577 and high-sensitivity troponin was 3.  COVID-19 PCR came back negative.  Chest x-ray showed early CHF.  Influenza antigens came back negative.  EKG showed normal sinus rhythm with rate of 67 with probable left atrial enlargement  The patient was given 40 mg of IV Lasix.  He will be admitted to telemetry bed for further evaluation and management.  Chronology per ICU: 06/08/19 - patient has been diuresing well, we did ultrasound asessment at bedside and pleural effusion is now small.  06/09/19 -patient has thickened airway secretions this am which were sent for micro, empiric abx broadened, attempting weaning SBT trial today.  Discussed care plan with niece [name] today.  06/10/19 -airway secretions are significantly improved. Will perform SBT today and liberate from MV 06/11/19- patient has clinically improved, for swallow eval today, develops atelectasis working on chest PT today, CXR today 06/12/19- participating with RT to use [facility], optimizing medically for medsurg with telemetry. Diet advanced. HD  today.  Patient seen and examined.  Remains in stepdown status.  Visibly appears well.  Working with respiratory therapy.  Diet advanced.  Lasix GTT stopped per nephrology.  Anticipate medical readiness for downgrade to MedSurg status within 24 to 48 hours.  Discussed with ICU attending.   Assessment & Plan:   Active Problems:   Acute on chronic diastolic CHF (congestive heart failure) (HCC)   Anemia of chronic disease   Essential hypertension   Type 2 diabetes mellitus with hyperlipidemia (HCC)   Acute on chronic heart failure with preserved ejection fraction (HFpEF) (HCC)   Heart failure (HCC)   Edema of both lower extremities   ESRD (end stage renal disease) ([place])  Acute hypoxic respiratory failure.   Patient was extubated 06/10/2019.   On high flow nasal cannula, titrated to 6 L nasal cannula this morning On empiric Fortaz/doxy Lasix drip stopped per nephrology, now on daily regimen Maintain strict ins and outs Markedly net negative, -7 L since admission May be stable for downgrade to MedSurg status within the next 24 to 48 hours  End-stage renal disease on hemodialysis  Catheter placed in the right chest.   Lasix drip stopped after initiation of hemodialysis Anticipate patient will need long-term dialysis Nephrology on board, will look for outpatient HD chair  Acute on chronic diastolic congestive heart failure   Continue low-dose Coreg, dose decreased per cardiology to allow for more ultrafiltration during dialysis Once daily Lasix  Type 2 diabetes mellitus with hyperlipidemia  on Lipitor   Sliding scale insulin  Glaucoma  Ophthalmic gtt.  Essential hypertension Coreg as above  Depression Zoloft  Anemia of chronic disease Vitamin B12 deficiency Continue to monitor closely No indication to transfuse packed red cells at this moment IM B12 shots    DVT prophylaxis: Serial compression devices Code Status: Full code Family Communication: None  today Disposition Plan: Home with home health, unclear date of discharge at this time  Consultants:   Cardiology  Nephrology  Critical care  Procedures:   Endotracheal intubation, now extubated  Temporary dialysis catheter placement  Antimicrobials:  Ceftazidime  Doxycycline   Subjective: Seen and examined No acute status changes overnight Titrated to 4 to 6 L nasal cannula Mentating well  Objective: Vitals:   06/12/19 1300 06/12/19 1400 06/12/19 1500 06/12/19 1600  BP: (!) 98/44 (!) 121/52 (!) 150/65 (!) 118/57  Pulse: 66 69 80 75  Resp: (!) 24 (!) 26 (!) 31 (!) 21  Temp:  97.9 F (36.6 C)    TempSrc:  Oral    SpO2: 100% 100% 99% 93%  Weight:      Height:        Intake/Output Summary (Last 24 hours) at 06/12/2019 1609 Last data filed at 06/12/2019 1524 Gross per 24 hour  Intake 82.29 ml  Output 1570 ml  Net -1487.71 ml   Filed Weights   06/12/19 0500 06/12/19 0855 06/12/19 1135  Weight: 62.3 kg 60.6 kg 60.2 kg    Examination:  General exam: Appears calm and comfortable  Respiratory system: Scattered crackles bilaterally.  Normal work of breathing.  No audible wheeze Cardiovascular system: S1-S2, RRR, no murmurs  gastrointestinal system: Abdomen is nondistended, soft and nontender. No organomegaly or masses felt. Normal bowel sounds heard. Central nervous system: Alert and oriented. No focal neurological deficits. Extremities: Symmetric 5 x 5 power. Skin: No rashes, lesions or ulcers Psychiatry: Judgement and insight appear normal. Mood & affect appropriate.     Data Reviewed: I have personally reviewed following labs and imaging studies  CBC: Recent Labs  Lab 06/06/19 2023 06/06/19 2023 06/07/19 0629 06/08/19 0326 06/09/19 0448 06/11/19 0434 06/12/19 0435  WBC 6.0   < > 6.5 6.9 9.1 11.7* 9.2  NEUTROABS 5.2  --   --   --   --   --  7.2  HGB 8.6*   < > 8.0* 8.8* 8.9* 7.8* 7.7*  HCT 29.0*   < > 25.5* 26.6* 26.9* 25.4* 26.2*  MCV 88.4    < > 86.4 82.4 81.0 86.1 88.5  PLT 267   < > 243 263 250 249 249   < > = values in this interval not displayed.   Basic Metabolic Panel: Recent Labs  Lab 06/06/19 0913 06/06/19 2023 06/07/19 [id number] 06/07/19 [id number] 06/07/19 [id number] 06/07/19 [id number] 06/08/19 0326 06/09/19 0448 06/10/19 0424 06/11/19 0434 06/12/19 0435  NA  --    < > 139   < > 138   < > 139 139 137 142 142  K  --    < > 4.2   < > 4.2   < > 3.7 3.3* 3.7 3.8 3.3*  CL  --    < > 103   < > 103   < > 104 105 104 107 104  CO2  --    < > 25   < > 26   < > 24 22 21* 22 31  GLUCOSE  --    < > 111*   < > 109*   < > 113* 116* 114* 101* 129*  BUN  --    < >  63*   < > 66*   < > 65* 66* 71* 79* 50*  CREATININE  --    < > 3.81*   < > 3.95*   < > 3.75* 3.88* 4.00* 4.04* 3.00*  CALCIUM  --    < > 9.5   < > 9.4   < > 9.3 9.1 8.9 9.3 8.8*  MG 2.5*  --   --   --  2.6*  --  2.4  --  2.2  --  2.1  PHOS  --   --  4.3  --   --   --   --   --  4.7*  --  4.0   < > = values in this interval not displayed.   GFR: Estimated Creatinine Clearance: 11.3 mL/min (A) (by C-G formula based on SCr of 3 mg/dL (H)). Liver Function Tests: Recent Labs  Lab 06/06/19 2023 06/07/19 0612  AST 26  --   ALT 23  --   ALKPHOS 68  --   BILITOT 0.6  --   PROT 6.3*  --   ALBUMIN 3.5 3.3*   No results for input(s): LIPASE, AMYLASE in the last 168 hours. No results for input(s): AMMONIA in the last 168 hours. Coagulation Profile: Recent Labs  Lab 06/07/19 1105  INR 1.2   Cardiac Enzymes: No results for input(s): CKTOTAL, CKMB, CKMBINDEX, TROPONINI in the last 168 hours. BNP (last 3 results) No results for input(s): PROBNP in the last 8760 hours. HbA1C: No results for input(s): HGBA1C in the last 72 hours. CBG: Recent Labs  Lab 06/11/19 1633 06/11/19 1939 06/11/19 2149 06/12/19 0718 06/12/19 1121  GLUCAP 192* 126* 145* 113* 134*   Lipid Profile: No results for input(s): CHOL, HDL, LDLCALC, TRIG, CHOLHDL, LDLDIRECT in the last 72 hours. Thyroid  Function Tests: No results for input(s): TSH, T4TOTAL, FREET4, T3FREE, THYROIDAB in the last 72 hours. Anemia Panel: Recent Labs    06/11/19 0434  FERRITIN 169   Sepsis Labs: Recent Labs  Lab 06/09/19 1030 06/10/19 0424  PROCALCITON 0.67 6.80    Recent Results (from the past 240 hour(s))  Respiratory Panel by RT PCR (Flu A&B, Covid) - Nasopharyngeal Swab     Status: None   Collection Time: 06/05/19  2:27 AM   Specimen: Nasopharyngeal Swab  Result Value Ref Range Status   SARS Coronavirus 2 by RT PCR NEGATIVE NEGATIVE Final    Comment: (NOTE) SARS-CoV-2 target nucleic acids are NOT DETECTED. The SARS-CoV-2 RNA is generally detectable in upper respiratoy specimens during the acute phase of infection. The lowest concentration of SARS-CoV-2 viral copies this assay can detect is 131 copies/mL. A negative result does not preclude SARS-Cov-2 infection and should not be used as the sole basis for treatment or other patient management decisions. A negative result may occur with  improper specimen collection/handling, submission of specimen other than nasopharyngeal swab, presence of viral mutation(s) within the areas targeted by this assay, and inadequate number of viral copies (<131 copies/mL). A negative result must be combined with clinical observations, patient history, and epidemiological information. The expected result is Negative. Fact Sheet for Patients:  PinkCheek.be Fact Sheet for Healthcare Providers:  GravelBags.it This test is not yet ap proved or cleared by the [place] FDA and  has been authorized for detection and/or diagnosis of SARS-CoV-2 by FDA under an Emergency Use Authorization (EUA). This EUA will remain  in effect (meaning this test can be used) for the duration of the COVID-19 declaration  under Section 564(b)(1) of the Act, 21 U.S.C. section 360bbb-3(b)(1), unless the authorization is  terminated or revoked sooner.    Influenza A by PCR NEGATIVE NEGATIVE Final   Influenza B by PCR NEGATIVE NEGATIVE Final    Comment: (NOTE) The Xpert Xpress SARS-CoV-2/FLU/RSV assay is intended as an aid in  the diagnosis of influenza from Nasopharyngeal swab specimens and  should not be used as a sole basis for treatment. Nasal washings and  aspirates are unacceptable for Xpert Xpress SARS-CoV-2/FLU/RSV  testing. Fact Sheet for Patients: PinkCheek.be Fact Sheet for Healthcare Providers: GravelBags.it This test is not yet approved or cleared by the [place] FDA and  has been authorized for detection and/or diagnosis of SARS-CoV-2 by  FDA under an Emergency Use Authorization (EUA). This EUA will remain  in effect (meaning this test can be used) for the duration of the  Covid-19 declaration under Section 564(b)(1) of the Act, 21  U.S.C. section 360bbb-3(b)(1), unless the authorization is  terminated or revoked. Performed at [facility], [address] [place], [place] [address]   MRSA PCR Screening     Status: None   Collection Time: 06/07/19 12:53 AM   Specimen: Nasopharyngeal  Result Value Ref Range Status   MRSA by PCR NEGATIVE NEGATIVE Final    Comment:        The GeneXpert MRSA Assay (FDA approved for NASAL specimens only), is one component of a comprehensive MRSA colonization surveillance program. It is not intended to diagnose MRSA infection nor to guide or monitor treatment for MRSA infections. Performed at [facility], [address] [place], [place] [address]   Culture, respiratory (non-expectorated)     Status: None   Collection Time: 06/09/19 10:11 AM   Specimen: Tracheal Aspirate; Respiratory  Result Value Ref Range Status   Specimen Description   Final    TRACHEAL ASPIRATE Performed at [facility], [address], [place], [place] [address]    Special Requests    Final    NONE Performed at [facility], [address] [place], [place] [address]    Gram Stain   Final    MODERATE WBC PRESENT, PREDOMINANTLY PMN MODERATE GRAM POSITIVE COCCI IN PAIRS IN CHAINS FEW GRAM VARIABLE ROD    Culture   Final    FEW Consistent with normal respiratory flora. Performed at [facility], [place] [address], [place], [place] [address]    Report Status 06/12/2019 FINAL  Final         Radiology Studies: DG Chest Port 1 View  Result Date: 06/11/2019 CLINICAL DATA:  Shortness of breath. Acute on chronic congestive heart failure. EXAM: PORTABLE CHEST 1 VIEW COMPARISON:  Chest x-rays dated 06/09/2019 and 06/08/2019 FINDINGS: Endotracheal tube and NG tube have been removed. Double lumen central venous catheter is in place, unchanged. Heart size is normal. The pulmonary vascularity has improved. Improved aeration at the right lung base. There is a persistent small left effusion and atelectasis at the left base with minimal residual atelectasis on the right. IMPRESSION: Improving pulmonary vascular congestion. Improved aeration at the right lung base. Aortic Atherosclerosis (ICD10-I70.0). Electronically Signed   By: Lorriane Shire M.D.   On: 06/11/2019 11:00        Scheduled Meds: . aspirin  81 mg Oral Daily  . atorvastatin  20 mg Oral q1800  . brimonidine  1 drop Both Eyes BID  . carvedilol  12.5 mg Oral BID  . Chlorhexidine Gluconate Cloth  6  each Topical Daily  . cyanocobalamin  1,000 mcg Intramuscular Q1400  . dorzolamide-timolol  1 drop Both Eyes BID  . epoetin (EPOGEN/PROCRIT) injection  10,000 Units Subcutaneous Weekly  . famotidine  20 mg Oral QHS  . furosemide  80 mg Intravenous BID  . insulin aspart  0-5 Units Subcutaneous QHS  . insulin aspart  0-6 Units Subcutaneous TID WC  . ipratropium-albuterol  3 mL Nebulization QID  . latanoprost  1 drop Left Eye QHS  . mouth rinse  15 mL Mouth Rinse BID  . olopatadine  1 drop Both Eyes BID   . senna-docusate  2 tablet Oral BID  . sertraline  25 mg Oral Daily  . sodium chloride flush  3 mL Intravenous Q12H   Continuous Infusions: . sodium chloride    . cefTAZidime (FORTAZ)  IV Stopped (06/12/19 1020)     LOS: 7 days    Time spent: 35 minutes    Sidney Ace, MD [facility] Pager [phone]  If 7PM-7AM, please contact night-coverage 06/12/2019, 4:09 PM

## 2019-06-13 ENCOUNTER — Inpatient Hospital Stay: Payer: Medicare Other

## 2019-06-13 LAB — CBC WITH DIFFERENTIAL/PLATELET
Abs Immature Granulocytes: 0.04 10*3/uL (ref 0.00–0.07)
Basophils Absolute: 0 10*3/uL (ref 0.0–0.1)
Basophils Relative: 0 %
Eosinophils Absolute: 0.2 10*3/uL (ref 0.0–0.5)
Eosinophils Relative: 2 %
HCT: 25.1 % — ABNORMAL LOW (ref 36.0–46.0)
Hemoglobin: 7.4 g/dL — ABNORMAL LOW (ref 12.0–15.0)
Immature Granulocytes: 1 %
Lymphocytes Relative: 13 %
Lymphs Abs: 0.9 10*3/uL (ref 0.7–4.0)
MCH: 26.1 pg (ref 26.0–34.0)
MCHC: 29.5 g/dL — ABNORMAL LOW (ref 30.0–36.0)
MCV: 88.7 fL (ref 80.0–100.0)
Monocytes Absolute: 1 10*3/uL (ref 0.1–1.0)
Monocytes Relative: 15 %
Neutro Abs: 4.7 10*3/uL (ref 1.7–7.7)
Neutrophils Relative %: 69 %
Platelets: 214 10*3/uL (ref 150–400)
RBC: 2.83 MIL/uL — ABNORMAL LOW (ref 3.87–5.11)
RDW: 14.5 % (ref 11.5–15.5)
WBC: 6.8 10*3/uL (ref 4.0–10.5)
nRBC: 0 % (ref 0.0–0.2)

## 2019-06-13 LAB — BASIC METABOLIC PANEL
Anion gap: 8 (ref 5–15)
BUN: 43 mg/dL — ABNORMAL HIGH (ref 8–23)
CO2: 30 mmol/L (ref 22–32)
Calcium: 8.6 mg/dL — ABNORMAL LOW (ref 8.9–10.3)
Chloride: 102 mmol/L (ref 98–111)
Creatinine, Ser: 2.43 mg/dL — ABNORMAL HIGH (ref 0.44–1.00)
GFR calc Af Amer: 20 mL/min — ABNORMAL LOW (ref >60)
GFR calc non Af Amer: 18 mL/min — ABNORMAL LOW (ref >60)
Glucose, Bld: 89 mg/dL (ref 70–99)
Potassium: 3.5 mmol/L (ref 3.5–5.1)
Sodium: 140 mmol/L (ref 135–145)

## 2019-06-13 LAB — GLUCOSE, CAPILLARY
Glucose-Capillary: 95 mg/dL (ref 70–99)
Glucose-Capillary: 141 mg/dL — ABNORMAL HIGH (ref 70–99)
Glucose-Capillary: 137 mg/dL — ABNORMAL HIGH (ref 70–99)

## 2019-06-13 MED ORDER — INSULIN ASPART 100 UNIT/ML ~~LOC~~ SOLN
3.0000 [IU] | Freq: Three times a day (TID) | SUBCUTANEOUS | Status: DC
  Administered 2019-06-13 – 2019-06-17 (×7): 3 [IU] via SUBCUTANEOUS
  Filled 2019-06-13 (×7): qty 1

## 2019-06-13 MED ORDER — TORSEMIDE 20 MG PO TABS
40.0000 mg | ORAL_TABLET | Freq: Every day | ORAL | Status: DC
  Administered 2019-06-13 – 2019-06-17 (×5): 40 mg via ORAL
  Filled 2019-06-13 (×5): qty 2

## 2019-06-13 MED ORDER — SODIUM CHLORIDE 0.9 % IV SOLN
1.0000 g | INTRAVENOUS | Status: AC
  Administered 2019-06-13 – 2019-06-14 (×2): 1 g via INTRAVENOUS
  Filled 2019-06-13 (×3): qty 1

## 2019-06-13 NOTE — Progress Notes (Signed)
[facility], [place] 06/13/19  Subjective:   Hospital day # 8 Patient is tolerating HD well Some cough Eating well Still requiring [place] O2   Objective:  Vital signs in last 24 hours:  Temp:  [97.6 F (36.4 C)-98.4 F (36.9 C)] 98.4 F (36.9 C) (02/11 0800) Pulse Rate:  [65-95] 81 (02/11 0900) Resp:  [17-31] 20 (02/11 0900) BP: (91-162)/(44-83) 137/63 (02/11 0900) SpO2:  [90 %-100 %] 98 % (02/11 0900) Weight:  [60.2 kg-61.1 kg] 61.1 kg (02/11 0424)  Weight change: -1.3 kg Filed Weights   06/12/19 0855 06/12/19 1135 06/13/19 0424  Weight: 60.6 kg 60.2 kg 61.1 kg    Intake/Output:    Intake/Output Summary (Last 24 hours) at 06/13/2019 [id number] Last data filed at 06/13/2019 0800 Gross per 24 hour  Intake 53 ml  Output 825 ml  Net -772 ml     Physical Exam: General:  Chronically ill-appearing, laying in the bed  HEENT  anicteric, moist oral membranes  Pulm/lungs   NCO2, coarse breath sounds, mild crackles  CVS/Heart  regular, no rub  Abdomen:   Soft  Extremities:  Peripheral edema present  Neurologic:  Alert and able to follow simple commands  Skin:  Warm  Access:  Right IJ PermCath   Foley in place    Basic Metabolic Panel:  Recent Labs  Lab 06/07/19 0612 06/07/19 0612 06/07/19 [id number] 06/07/19 [id number] 06/08/19 0326 06/08/19 0326 06/09/19 0448 06/09/19 0448 06/10/19 0424 06/10/19 0424 06/11/19 0434 06/12/19 0435 06/13/19 0322  NA 139   < > 138   < > 139   < > 139  --  137  --  142 142 140  K 4.2   < > 4.2   < > 3.7   < > 3.3*  --  3.7  --  3.8 3.3* 3.5  CL 103   < > 103   < > 104   < > 105  --  104  --  107 104 102  CO2 25   < > 26   < > 24   < > 22  --  21*  --  22 31 30   GLUCOSE 111*   < > 109*   < > 113*   < > 116*  --  114*  --  101* 129* 89  BUN 63*   < > 66*   < > 65*   < > 66*  --  71*  --  79* 50* 43*  CREATININE 3.81*   < > 3.95*   < > 3.75*   < > 3.88*  --  4.00*  --  4.04* 3.00* 2.43*  CALCIUM 9.5   < > 9.4   < > 9.3    < > 9.1   < > 8.9   < > 9.3 8.8* 8.6*  MG  --   --  2.6*  --  2.4  --   --   --  2.2  --   --  2.1  --   PHOS 4.3  --   --   --   --   --   --   --  4.7*  --   --  4.0  --    < > = values in this interval not displayed.     CBC: Recent Labs  Lab 06/06/19 2023 06/07/19 0629 06/08/19 0326 06/09/19 0448 06/11/19 0434 06/12/19 0435 06/13/19 0322  WBC 6.0   < > 6.9 9.1 11.7* 9.2  6.8  NEUTROABS 5.2  --   --   --   --  7.2 4.7  HGB 8.6*   < > 8.8* 8.9* 7.8* 7.7* 7.4*  HCT 29.0*   < > 26.6* 26.9* 25.4* 26.2* 25.1*  MCV 88.4   < > 82.4 81.0 86.1 88.5 88.7  PLT 267   < > 263 250 249 249 214   < > = values in this interval not displayed.      Lab Results  Component Value Date   HEPBSAG NON REACTIVE 06/07/2019   HEPBSAB NON REACTIVE 06/07/2019   HEPBIGM NON REACTIVE 06/07/2019      Microbiology:  Recent Results (from the past 240 hour(s))  Respiratory Panel by RT PCR (Flu A&B, Covid) - Nasopharyngeal Swab     Status: None   Collection Time: 06/05/19  2:27 AM   Specimen: Nasopharyngeal Swab  Result Value Ref Range Status   SARS Coronavirus 2 by RT PCR NEGATIVE NEGATIVE Final    Comment: (NOTE) SARS-CoV-2 target nucleic acids are NOT DETECTED. The SARS-CoV-2 RNA is generally detectable in upper respiratoy specimens during the acute phase of infection. The lowest concentration of SARS-CoV-2 viral copies this assay can detect is 131 copies/mL. A negative result does not preclude SARS-Cov-2 infection and should not be used as the sole basis for treatment or other patient management decisions. A negative result may occur with  improper specimen collection/handling, submission of specimen other than nasopharyngeal swab, presence of viral mutation(s) within the areas targeted by this assay, and inadequate number of viral copies (<131 copies/mL). A negative result must be combined with clinical observations, patient history, and epidemiological information. The expected result is  Negative. Fact Sheet for Patients:  PinkCheek.be Fact Sheet for Healthcare Providers:  GravelBags.it This test is not yet ap proved or cleared by the [place] FDA and  has been authorized for detection and/or diagnosis of SARS-CoV-2 by FDA under an Emergency Use Authorization (EUA). This EUA will remain  in effect (meaning this test can be used) for the duration of the COVID-19 declaration under Section 564(b)(1) of the Act, 21 U.S.C. section 360bbb-3(b)(1), unless the authorization is terminated or revoked sooner.    Influenza A by PCR NEGATIVE NEGATIVE Final   Influenza B by PCR NEGATIVE NEGATIVE Final    Comment: (NOTE) The Xpert Xpress SARS-CoV-2/FLU/RSV assay is intended as an aid in  the diagnosis of influenza from Nasopharyngeal swab specimens and  should not be used as a sole basis for treatment. Nasal washings and  aspirates are unacceptable for Xpert Xpress SARS-CoV-2/FLU/RSV  testing. Fact Sheet for Patients: PinkCheek.be Fact Sheet for Healthcare Providers: GravelBags.it This test is not yet approved or cleared by the [place] FDA and  has been authorized for detection and/or diagnosis of SARS-CoV-2 by  FDA under an Emergency Use Authorization (EUA). This EUA will remain  in effect (meaning this test can be used) for the duration of the  Covid-19 declaration under Section 564(b)(1) of the Act, 21  U.S.C. section 360bbb-3(b)(1), unless the authorization is  terminated or revoked. Performed at [facility], [address] [place], [place] [address]   MRSA PCR Screening     Status: None   Collection Time: 06/07/19 12:53 AM   Specimen: Nasopharyngeal  Result Value Ref Range Status   MRSA by PCR NEGATIVE NEGATIVE Final    Comment:        The GeneXpert MRSA Assay (FDA approved for NASAL specimens only), is one component of  a comprehensive MRSA colonization surveillance program. It is not intended to diagnose MRSA infection nor to guide or monitor treatment for MRSA infections. Performed at [facility], [address] [place], [place] [address]   Culture, respiratory (non-expectorated)     Status: None   Collection Time: 06/09/19 10:11 AM   Specimen: Tracheal Aspirate; Respiratory  Result Value Ref Range Status   Specimen Description   Final    TRACHEAL ASPIRATE Performed at [facility], [address] [place], [place] [address]    Special Requests   Final    NONE Performed at [facility], [address] [place], [place] [address]    Gram Stain   Final    MODERATE WBC PRESENT, PREDOMINANTLY PMN MODERATE GRAM POSITIVE COCCI IN PAIRS IN CHAINS FEW GRAM VARIABLE ROD    Culture   Final    FEW Consistent with normal respiratory flora. Performed at [facility], [place] [address], [place], [place] [address]    Report Status 06/12/2019 FINAL  Final    Coagulation Studies: No results for input(s): LABPROT, INR in the last 72 hours.  Urinalysis: No results for input(s): COLORURINE, LABSPEC, PHURINE, GLUCOSEU, HGBUR, BILIRUBINUR, KETONESUR, PROTEINUR, UROBILINOGEN, NITRITE, LEUKOCYTESUR in the last 72 hours.  Invalid input(s): APPERANCEUR    Imaging: DG Chest Port 1 View  Result Date: 06/11/2019 CLINICAL DATA:  Shortness of breath. Acute on chronic congestive heart failure. EXAM: PORTABLE CHEST 1 VIEW COMPARISON:  Chest x-rays dated 06/09/2019 and 06/08/2019 FINDINGS: Endotracheal tube and NG tube have been removed. Double lumen central venous catheter is in place, unchanged. Heart size is normal. The pulmonary vascularity has improved. Improved aeration at the right lung base. There is a persistent small left effusion and atelectasis at the left base with minimal residual atelectasis on the right. IMPRESSION: Improving pulmonary vascular congestion. Improved  aeration at the right lung base. Aortic Atherosclerosis (ICD10-I70.0). Electronically Signed   By: Lorriane Shire M.D.   On: 06/11/2019 11:00     Medications:   . sodium chloride    . cefTAZidime (FORTAZ)  IV Stopped (06/12/19 1020)   . aspirin  81 mg Oral Daily  . atorvastatin  20 mg Oral q1800  . brimonidine  1 drop Both Eyes BID  . carvedilol  12.5 mg Oral BID  . Chlorhexidine Gluconate Cloth  6 each Topical Daily  . cyanocobalamin  1,000 mcg Intramuscular Q1400  . dorzolamide-timolol  1 drop Both Eyes BID  . epoetin (EPOGEN/PROCRIT) injection  10,000 Units Subcutaneous Weekly  . famotidine  20 mg Oral QHS  . furosemide  80 mg Intravenous BID  . insulin aspart  0-5 Units Subcutaneous QHS  . insulin aspart  0-6 Units Subcutaneous TID WC  . ipratropium-albuterol  3 mL Nebulization QID  . latanoprost  1 drop Left Eye QHS  . mouth rinse  15 mL Mouth Rinse BID  . olopatadine  1 drop Both Eyes BID  . senna-docusate  2 tablet Oral BID  . sertraline  25 mg Oral Daily  . sodium chloride flush  3 mL Intravenous Q12H   sodium chloride, acetaminophen, ALPRAZolam, bisacodyl, hydrALAZINE, ondansetron (ZOFRAN) IV, sodium chloride flush, traZODone  Assessment/ Plan:  86 y.o.African [place] female with diabetes mellitus type II, hypertension, anemia, GERD, depression, anxiety, glaucoma, hyperlipidemia, diastolic congestive heart failure, coronary artery disease admitted on 06/05/2019 for SOB (shortness of breath) [R06.02] Acute CHF (congestive heart failure) ([place]) [I50.9] Acute respiratory failure with hypoxia ([place]) [J96.01] Heart failure ([place]) [  I50.9] Acute on chronic congestive heart failure, unspecified heart failure type ([place]) [I50.9]   1.  End-stage renal disease Chronic kidney disease is likely secondary to diabetic nephropathy and hypertensive nephrosclerosis.  This admission, patient presented via EMS from home for respiratory distress, shortness of breath, worsening lower extremity  edema.  She has failed outpatient volume management. Dialysis catheter was placed this admission and started on chronic dialysis, which she is tolerating well  Outpatient dialysis placement in progress. Patient has to be able to sit in chair for dialysis prior to discharge She worked with OT yesterday  2.  Acute respiratory failure Hospital course complicated by respiratory arrest the night of February 4  Responded well to furosemide infusion,  Volume status appears to have optimized Extubated February 8 Will change iv lasix to oral torsemide  3. Diabetes type 2 with CKD Lab Results  Component Value Date   HGBA1C 6.0 (H) 04/10/2019   4. Anemia of CKD -  Lab Results  Component Value Date   HGB 7.4 (L) 06/13/2019   EPO SQ, saturday  5. [facility] Lab Results  Component Value Date   PTH 148 (H) 06/07/2019   CALCIUM 8.6 (L) 06/13/2019   PHOS 4.0 06/12/2019   Monitor phos     LOS: 8 Rayvon Dakin 2/11/20219:38 AM  [facility] [place], [place]  Note: This note was prepared with Dragon dictation. Any transcription errors are unintentional

## 2019-06-13 NOTE — Progress Notes (Signed)
OT Cancellation Note  Patient Details Name: Lori Stanley MRN: [id number] DOB: [birth year]   Cancelled Treatment:    Reason Eval/Treat Not Completed: Patient at procedure or test/ unavailable   Upon chart review, patient currently receiving HD in room.  Unable to participate at this time.  Will follow up as time permits.  Thank you.    Oren Binet 06/13/2019, 12:53 PM

## 2019-06-13 NOTE — Progress Notes (Signed)
CRITICAL CARE PROGRESS NOTE    Name: Lori Stanley MRN: [id number] DOB: 1931/10/09     LOS: 54   SUBJECTIVE FINDINGS & SIGNIFICANT EVENTS   Patient description:  86 yo female admitted to the telemetry unit with acute on chronic hypoxic respiratory failure secondary to acute diastolic CHF exacerbation.  On 02/4 pt respiratory arrested requiring mechanical intubation and transfer to ICU  Lines / Drains: PIV  Cultures / Sepsis markers: COVID negative  Antibiotics: vanco/ceftaz   Protocols / Consultants: Vascular , Nephrology, PCCM, hospitalist  06/08/19 - patient has been diuresing well, we did ultrasound asessment at bedside and pleural effusion is now small.  06/09/19 -patient has thickened airway secretions this am which were sent for micro, empiric abx broadened, attempting weaning SBT trial today.  Discussed care plan with niece [name] today.  06/10/19 -airway secretions are significantly improved. Will perform SBT today and liberate from MV 06/11/19- patient has clinically improved, for swallow eval today, develops atelectasis working on chest PT today, CXR today 06/12/19- participating with RT to use MetaNE[facility], optimizing medically for medsurg with telemetry. Diet advanced. HD today. 06/13/19 - optimizing for transfer to floor.  Discussed with Dr Priscella Mann.  Patient is having HD treatment during my eval, shes clapping hands feeling much better in good spirits.     PAST MEDICAL HISTORY   Past Medical History:  Diagnosis Date  . Acute heart failure ([place])   . Chronic low back pain   . Diabetes mellitus without complication ([place])   . Hyperlipidemia   . Hypertension   . MI (myocardial infarction) ([place])   . Obesity      SURGICAL HISTORY   Past Surgical History:  Procedure Laterality Date  .  DIALYSIS/PERMA CATHETER INSERTION N/A 06/07/2019   Procedure: DIALYSIS/PERMA CATHETER INSERTION;  Surgeon: Katha Cabal, MD;  Location: [facility];  Service: Cardiovascular;  Laterality: N/A;  . EYE SURGERY    . KNEE SURGERY    . THYROID SURGERY    . VAGINAL HYSTERECTOMY       FAMILY HISTORY   Family History  Problem Relation Age of Onset  . Heart attack Mother   . Heart disease Sister   . Cancer Sister      SOCIAL HISTORY   Social History   Tobacco Use  . Smoking status: Former Smoker    Types: Cigarettes  . Smokeless tobacco: Never Used  . Tobacco comment: Quit in 2015-2016  Substance Use Topics  . Alcohol use: No  . Drug use: No     MEDICATIONS   Current Medication:  Current Facility-Administered Medications:  .  0.9 %  sodium chloride infusion, 250 mL, Intravenous, PRN, Mansy, Jan A, MD .  acetaminophen (TYLENOL) suppository 650 mg, 650 mg, Rectal, Q4H PRN, Awilda Bill, NP .  ALPRAZolam Duanne Moron) tablet 0.25 mg, 0.25 mg, Oral, BID PRN, Awilda Bill, NP, 0.25 mg at 06/12/19 1219 .  aspirin chewable tablet 81 mg, 81 mg, Oral, Daily, Graceland Wachter, MD, 81 mg at 06/13/19 0912 .  atorvastatin (LIPITOR) tablet 20 mg, 20 mg, Oral, q1800, Ottie Glazier, MD, 20 mg at 06/12/19 1704 .  bisacodyl (DULCOLAX) suppository 10 mg, 10 mg, Rectal, Daily PRN, Awilda Bill, NP .  brimonidine (ALPHAGAN) 0.2 % ophthalmic solution 1 drop, 1 drop, Both Eyes, BID, Mansy, Jan A, MD, 1 drop at 06/13/19 0908 .  carvedilol (COREG) tablet 12.5 mg, 12.5 mg, Oral, BID, Adalia Pettis, MD, 12.5 mg at 06/13/19 0912 .  cefTAZidime (FORTAZ) 0.5 g in dextrose 5 % 50 mL IVPB, 0.5 g, Intravenous, Q24H, Ottie Glazier, MD, Stopped at 06/12/19 1020 .  Chlorhexidine Gluconate Cloth 2 % PADS 6 each, 6 each, Topical, Daily, Awilda Bill, NP, 6 each at 06/12/19 1458 .  cyanocobalamin ((VITAMIN B-12)) injection 1,000 mcg, 1,000 mcg, Intramuscular, Q1400, Wieting, Richard, MD,  1,000 mcg at 06/12/19 1305 .  dorzolamide-timolol (COSOPT) 22.3-6.8 MG/ML ophthalmic solution 1 drop, 1 drop, Both Eyes, BID, Mansy, Jan A, MD, 1 drop at 06/13/19 0907 .  epoetin alfa (EPOGEN) injection 10,000 Units, 10,000 Units, Subcutaneous, Weekly, Candiss Norse, Har[place], MD, 10,000 Units at 06/12/19 1046 .  famotidine (PEPCID) tablet 20 mg, 20 mg, Oral, QHS, Shontell Prosser, MD, 20 mg at 06/12/19 2154 .  furosemide (LASIX) injection 80 mg, 80 mg, Intravenous, BID, Candiss Norse, Harmeet, MD, 80 mg at 06/13/19 0732 .  hydrALAZINE (APRESOLINE) injection 10-20 mg, 10-20 mg, Intravenous, Q4H PRN, Awilda Bill, NP, 10 mg at 06/11/19 0929 .  insulin aspart (novoLOG) injection 0-5 Units, 0-5 Units, Subcutaneous, QHS, Loletha Grayer, MD, 3 Units at 06/12/19 2154 .  insulin aspart (novoLOG) injection 0-6 Units, 0-6 Units, Subcutaneous, TID WC, Loletha Grayer, MD, 4 Units at 06/12/19 1625 .  ipratropium-albuterol (DUONEB) 0.5-2.5 (3) MG/3ML nebulizer solution 3 mL, 3 mL, Nebulization, QID, Enzo Bi, MD, 3 mL at 06/13/19 0808 .  latanoprost (XALATAN) 0.005 % ophthalmic solution 1 drop, 1 drop, Left Eye, QHS, Mansy, Jan A, MD, 1 drop at 06/12/19 2153 .  MEDLINE mouth rinse, 15 mL, Mouth Rinse, BID, Leslye Peer, Richard, MD, 15 mL at 06/12/19 0920 .  olopatadine (PATANOL) 0.1 % ophthalmic solution 1 drop, 1 drop, Both Eyes, BID, Mansy, Jan A, MD, 1 drop at 06/13/19 0907 .  ondansetron (ZOFRAN) injection 4 mg, 4 mg, Intravenous, Q6H PRN, Mansy, Jan A, MD .  senna-docusate (Senokot-S) tablet 2 tablet, 2 tablet, Oral, BID, Charlett Nose, RPH, 2 tablet at 06/13/19 0912 .  sertraline (ZOLOFT) tablet 25 mg, 25 mg, Oral, Daily, Margerie Fraiser, MD, 25 mg at 06/13/19 0912 .  sodium chloride flush (NS) 0.9 % injection 3 mL, 3 mL, Intravenous, Q12H, Mansy, Jan A, MD, 3 mL at 06/13/19 0912 .  sodium chloride flush (NS) 0.9 % injection 3 mL, 3 mL, Intravenous, PRN, Mansy, Jan A, MD .  traZODone (DESYREL) tablet 50 mg, 50 mg,  Oral, QHS PRN, Awilda Bill, NP, 50 mg at 06/12/19 2154    ALLERGIES   Antihistamines, chlorpheniramine-type; Codeine; and Paxil [paroxetine]    REVIEW OF SYSTEMS     Unable to obtain due to mechanical ventilation  PHYSICAL EXAMINATION   Vital Signs: Temp:  [97.6 F (36.4 C)-98.4 F (36.9 C)] 98.4 F (36.9 C) (02/11 0800) Pulse Rate:  [65-95] 81 (02/11 0900) Resp:  [17-31] 20 (02/11 0900) BP: (91-162)/(44-83) 137/63 (02/11 0900) SpO2:  [90 %-100 %] 98 % (02/11 0900) Weight:  [60.2 kg-61.1 kg] 61.1 kg (02/11 0424)  GENERAL: Chronically ill-appearing HEAD: Normocephalic, atraumatic.  EYES: Pupils equal, round, reactive to light.  No scleral icterus.  MOUTH: Moist mucosal membrane. NECK: Supple. No thyromegaly. No nodules. No JVD.  PULMONARY: Bibasilar crackles without wheezing CARDIOVASCULAR: S1 and S2. Regular rate and rhythm. No murmurs, rubs, or gallops.  GASTROINTESTINAL: Soft, nontender, non-distended. No masses. Positive bowel sounds. No hepatosplenomegaly.  MUSCULOSKELETAL: No swelling, clubbing, or edema.  NEUROLOGIC: liberated from MV GCS of 10 SKIN:intact,warm,dry   PERTINENT DATA     Infusions: . sodium chloride    .  cefTAZidime (FORTAZ)  IV Stopped (06/12/19 1020)   Scheduled Medications: . aspirin  81 mg Oral Daily  . atorvastatin  20 mg Oral q1800  . brimonidine  1 drop Both Eyes BID  . carvedilol  12.5 mg Oral BID  . Chlorhexidine Gluconate Cloth  6 each Topical Daily  . cyanocobalamin  1,000 mcg Intramuscular Q1400  . dorzolamide-timolol  1 drop Both Eyes BID  . epoetin (EPOGEN/PROCRIT) injection  10,000 Units Subcutaneous Weekly  . famotidine  20 mg Oral QHS  . furosemide  80 mg Intravenous BID  . insulin aspart  0-5 Units Subcutaneous QHS  . insulin aspart  0-6 Units Subcutaneous TID WC  . ipratropium-albuterol  3 mL Nebulization QID  . latanoprost  1 drop Left Eye QHS  . mouth rinse  15 mL Mouth Rinse BID  . olopatadine  1 drop  Both Eyes BID  . senna-docusate  2 tablet Oral BID  . sertraline  25 mg Oral Daily  . sodium chloride flush  3 mL Intravenous Q12H   PRN Medications: sodium chloride, acetaminophen, ALPRAZolam, bisacodyl, hydrALAZINE, ondansetron (ZOFRAN) IV, sodium chloride flush, traZODone Hemodynamic parameters:   Intake/Output: 02/10 0701 - 02/11 0700 In: 53 [I.V.:3; IV Piggyback:50] Out: 840 [Urine:340]  Ventilator  Settings:      LAB RESULTS:  Basic Metabolic Panel: Recent Labs  Lab 06/07/19 0612 06/07/19 0612 06/07/19 [id number] 06/07/19 [id number] 06/08/19 0326 06/08/19 0326 06/09/19 0448 06/09/19 0448 06/10/19 0424 06/10/19 0424 06/11/19 0434 06/11/19 0434 06/12/19 0435 06/13/19 0322  NA 139   < > 138   < > 139   < > 139  --  137  --  142  --  142 140  K 4.2   < > 4.2   < > 3.7   < > 3.3*   < > 3.7   < > 3.8   < > 3.3* 3.5  CL 103   < > 103   < > 104   < > 105  --  104  --  107  --  104 102  CO2 25   < > 26   < > 24   < > 22  --  21*  --  22  --  31 30  GLUCOSE 111*   < > 109*   < > 113*   < > 116*  --  114*  --  101*  --  129* 89  BUN 63*   < > 66*   < > 65*   < > 66*  --  71*  --  79*  --  50* 43*  CREATININE 3.81*   < > 3.95*   < > 3.75*   < > 3.88*  --  4.00*  --  4.04*  --  3.00* 2.43*  CALCIUM 9.5   < > 9.4   < > 9.3   < > 9.1  --  8.9  --  9.3  --  8.8* 8.6*  MG  --   --  2.6*  --  2.4  --   --   --  2.2  --   --   --  2.1  --   PHOS 4.3  --   --   --   --   --   --   --  4.7*  --   --   --  4.0  --    < > = values in this interval not displayed.   Liver Function  Tests: Recent Labs  Lab 06/06/19 2023 06/07/19 0612  AST 26  --   ALT 23  --   ALKPHOS 68  --   BILITOT 0.6  --   PROT 6.3*  --   ALBUMIN 3.5 3.3*   No results for input(s): LIPASE, AMYLASE in the last 168 hours. No results for input(s): AMMONIA in the last 168 hours. CBC: Recent Labs  Lab 06/06/19 2023 06/07/19 0629 06/08/19 0326 06/09/19 0448 06/11/19 0434 06/12/19 0435 06/13/19 0322  WBC 6.0   <  > 6.9 9.1 11.7* 9.2 6.8  NEUTROABS 5.2  --   --   --   --  7.2 4.7  HGB 8.6*   < > 8.8* 8.9* 7.8* 7.7* 7.4*  HCT 29.0*   < > 26.6* 26.9* 25.4* 26.2* 25.1*  MCV 88.4   < > 82.4 81.0 86.1 88.5 88.7  PLT 267   < > 263 250 249 249 214   < > = values in this interval not displayed.   Cardiac Enzymes: No results for input(s): CKTOTAL, CKMB, CKMBINDEX, TROPONINI in the last 168 hours. BNP: Invalid input(s): POCBNP CBG: Recent Labs  Lab 06/12/19 0718 06/12/19 1121 06/12/19 1610 06/12/19 2119 06/13/19 0707  GLUCAP 113* 134* 335* 271* 95     IMAGING RESULTS:  Imaging: DG Chest Port 1 View  Result Date: 06/11/2019 CLINICAL DATA:  Shortness of breath. Acute on chronic congestive heart failure. EXAM: PORTABLE CHEST 1 VIEW COMPARISON:  Chest x-rays dated 06/09/2019 and 06/08/2019 FINDINGS: Endotracheal tube and NG tube have been removed. Double lumen central venous catheter is in place, unchanged. Heart size is normal. The pulmonary vascularity has improved. Improved aeration at the right lung base. There is a persistent small left effusion and atelectasis at the left base with minimal residual atelectasis on the right. IMPRESSION: Improving pulmonary vascular congestion. Improved aeration at the right lung base. Aortic Atherosclerosis (ICD10-I70.0). Electronically Signed   By: Lorriane Shire M.D.   On: 06/11/2019 11:00      ASSESSMENT AND PLAN    -Multidisciplinary rounds held today  Severe acute Hypoxic Respiratory Failure -Due to acute on chronic decompensated diastolic CHF -extubated [id number] -cannot rule out infectious etiology - tracheal aspirate with GPC growing  Empiric abx with ceftaz/doxy -Lasix drip stopped, now on once daily regimen -Strict I's and O's with Foley catheter Net -7.7L    Status post cardiac arrest likely due to respiratory failure with hypoxemia -Cardiology on case appreciate input -Lasix drip currently ICU telemetry monitoring   Renal Failure acute on  chronic stage V with proteinuria Nephrology on case-appreciate input -Possible EPO and HD -Plan for subclavian port insertion with vascular surgery -follow chem 7 -follow UO -continue Foley Catheter-assess need daily   ID -continue IV abx as prescibed -follow up cultures  GI/Nutrition GI PROPHYLAXIS as indicated DIET-->TF's as tolerated Constipation protocol as indicated  ENDO - ICU hypoglycemic\Hyperglycemia protocol -check FSBS per protocol   ELECTROLYTES -follow labs as needed -replace as needed -pharmacy consultation   DVT/GI PRX ordered -SCDs  TRANSFUSIONS AS NEEDED MONITOR FSBS ASSESS the need for LABS as needed   Critical care provider statement:    Critical care time (minutes):  33   Critical care time was exclusive of:  Separately billable procedures and treating other patients   Critical care was necessary to treat or prevent imminent or life-threatening deterioration of the following conditions:   Severe acute hypoxemic respiratory failure, status post cardiac arrest, acute on chronic renal failure, essential  hypertension, diabetes mellitus, anemia, hyperparathyroidism, multiple comorbid conditions   Critical care was time spent personally by me on the following activities:  Development of treatment plan with patient or surrogate, discussions with consultants, evaluation of patient's response to treatment, examination of patient, obtaining history from patient or surrogate, ordering and performing treatments and interventions, ordering and review of laboratory studies and re-evaluation of patient's condition.  I assumed direction of critical care for this patient from another provider in my specialty: no    This document was prepared using Dragon voice recognition software and may include unintentional dictation errors.    Ottie Glazier, M.D.  Division of [place]

## 2019-06-13 NOTE — Progress Notes (Signed)
PROGRESS NOTE    Lori Stanley  [id number] DOB: [birth year] DOA: 06/05/2019 PCP: Jerrol Banana., MD   Brief Narrative: HPI: Lori Stanley  is a 86 y.o. female with a known history of CHF, hypertension, dyslipidemia and coronary artery disease, presented to the emergency room with an onset of worsening dyspnea over the last few days with associated dry cough as well as worsening lower extremity edema, orthopnea and occasional paroxysmal nocturnal dyspnea.  No dysuria, oliguria or hematuria or flank pain.  No chest pain or palpitations.  No headache or dizziness or blurred vision.  Upon presentation to the emergency room, blood pressure was 166/65 with respirate of 28 and pulse symmetry was 81% on 2 L of O2 by nasal cannula when he was placed on BiPAP with pulse oximetry improvement 100%.  Labs revealed a BUN of 61 and creatinine 3.86 compared to 40 and 3.2 on 04/10/2019.  BNP was 577 and high-sensitivity troponin was 3.  COVID-19 PCR came back negative.  Chest x-ray showed early CHF.  Influenza antigens came back negative.  EKG showed normal sinus rhythm with rate of 67 with probable left atrial enlargement  The patient was given 40 mg of IV Lasix.  He will be admitted to telemetry bed for further evaluation and management.  Chronology per ICU: 06/08/19 - patient has been diuresing well, we did ultrasound asessment at bedside and pleural effusion is now small.  06/09/19 -patient has thickened airway secretions this am which were sent for micro, empiric abx broadened, attempting weaning SBT trial today.  Discussed care plan with niece [name] today.  06/10/19 -airway secretions are significantly improved. Will perform SBT today and liberate from MV 06/11/19- patient has clinically improved, for swallow eval today, develops atelectasis working on chest PT today, CXR today 06/12/19- participating with RT to use MetaNEB, optimizing medically for medsurg with telemetry. Diet advanced. HD  today.  2/10: Patient seen and examined.  Remains in stepdown status.  Visibly appears well.  Working with respiratory therapy.  Diet advanced.  Lasix GTT stopped per nephrology.  Anticipate medical readiness for downgrade to MedSurg status within 24 to 48 hours.  Discussed with ICU attending.  2/11: Patient seen and examined.  Improved energy levels.  In good spirits.  Optimized for transfer to general medical floor.  Discussed with ICU attending.  Transfer orders placed to MedSurg bed.  Respiratory status remains stable over interval.  Currently requiring 5 L high flow nasal cannula.  Receiving hemodialysis treatment today.  Ultrafiltration as tolerated.   Assessment & Plan:   Active Problems:   Acute on chronic diastolic CHF (congestive heart failure) (HCC)   Anemia of chronic disease   Essential hypertension   Type 2 diabetes mellitus with hyperlipidemia (HCC)   Acute on chronic heart failure with preserved ejection fraction (HFpEF) (HCC)   Heart failure (HCC)   Edema of both lower extremities   ESRD (end stage renal disease) ([place])  Acute hypoxic respiratory failure.   Patient was extubated 06/10/2019.   On high flow nasal cannula, titrated to 5 L nasal cannula this morning On empiric Fortaz/doxy, will plan for empiric 7-day course Lasix drip stopped per nephrology, IV Lasix also stopped On p.o. torsemide per nephrology Maintain strict ins and outs Markedly net negative, -7 L since admission Stable for downgrade to MedSurg status today, discussed with Dr. Lanney Gins  End-stage renal disease on hemodialysis  Catheter placed in the right chest.   Lasix drip stopped after initiation of hemodialysis Anticipate  patient will need long-term dialysis Nephrology on board, will look for outpatient HD chair Patient will need to be able to sit upright independently in order to qualify for outpatient HD chair Physical therapy and Occupational Therapy on board  Acute on chronic diastolic  congestive heart failure   Continue low-dose Coreg, dose decreased per cardiology to allow for more ultrafiltration during dialysis Once daily torsemide  Type 2 diabetes mellitus with hyperlipidemia  on Lipitor   Sliding scale insulin 3 units NovoLog 3 times daily with meals  Glaucoma  Ophthalmic gtt.  Essential hypertension Coreg as above  Depression Zoloft  Anemia of chronic disease Vitamin B12 deficiency Continue to monitor closely No indication to transfuse packed red cells at this moment IM B12 shots    DVT prophylaxis: Serial compression devices Code Status: Full code Family Communication: None today Disposition Plan: Home with home health, unclear date of discharge at this time.  Patient will need a outpatient hemodialysis chair  Consultants:   Cardiology  Nephrology  Critical care  Procedures:   Endotracheal intubation, now extubated  Temporary dialysis catheter placement  Antimicrobials:  Ceftazidime  Doxycycline   Subjective: Seen and examined No acute status changes overnight On 5 L nasal cannula Mentating well, in good spirits  Objective: Vitals:   06/13/19 1245 06/13/19 1300 06/13/19 1310 06/13/19 1328  BP: 138/62 (!) 104/49 128/64 (!) 146/61  Pulse: 70 69 83 75  Resp: (!) 23 (!) 22 (!) 26 (!) 25  Temp:    (P) 98.4 F (36.9 C)  TempSrc:    (P) Oral  SpO2:  100%    Weight:      Height:        Intake/Output Summary (Last 24 hours) at 06/13/2019 1351 Last data filed at 06/13/2019 1310 Gross per 24 hour  Intake 50.24 ml  Output 972 ml  Net -921.76 ml   Filed Weights   06/12/19 1135 06/13/19 0424 06/13/19 1015  Weight: 60.2 kg 61.1 kg 61.1 kg    Examination:  General exam: Appears calm and comfortable  Respiratory system: Scattered crackles bilaterally.  Normal work of breathing.  No audible wheeze Cardiovascular system: S1-S2, RRR, no murmurs  gastrointestinal system: Abdomen is nondistended, soft and nontender. No  organomegaly or masses felt. Normal bowel sounds heard. Central nervous system: Alert and oriented. No focal neurological deficits. Extremities: Symmetric 5 x 5 power. Skin: No rashes, lesions or ulcers Psychiatry: Judgement and insight appear normal. Mood & affect appropriate.     Data Reviewed: I have personally reviewed following labs and imaging studies  CBC: Recent Labs  Lab 06/06/19 2023 06/07/19 0629 06/08/19 0326 06/09/19 0448 06/11/19 0434 06/12/19 0435 06/13/19 0322  WBC 6.0   < > 6.9 9.1 11.7* 9.2 6.8  NEUTROABS 5.2  --   --   --   --  7.2 4.7  HGB 8.6*   < > 8.8* 8.9* 7.8* 7.7* 7.4*  HCT 29.0*   < > 26.6* 26.9* 25.4* 26.2* 25.1*  MCV 88.4   < > 82.4 81.0 86.1 88.5 88.7  PLT 267   < > 263 250 249 249 214   < > = values in this interval not displayed.   Basic Metabolic Panel: Recent Labs  Lab 06/07/19 0612 06/07/19 0612 06/07/19 [id number] 06/07/19 [id number] 06/08/19 0326 06/08/19 0326 06/09/19 0448 06/10/19 0424 06/11/19 0434 06/12/19 0435 06/13/19 0322  NA 139   < > 138   < > 139   < > 139 137 142 142 140  K 4.2   < > 4.2   < > 3.7   < > 3.3* 3.7 3.8 3.3* 3.5  CL 103   < > 103   < > 104   < > 105 104 107 104 102  CO2 25   < > 26   < > 24   < > 22 21* 22 31 30   GLUCOSE 111*   < > 109*   < > 113*   < > 116* 114* 101* 129* 89  BUN 63*   < > 66*   < > 65*   < > 66* 71* 79* 50* 43*  CREATININE 3.81*   < > 3.95*   < > 3.75*   < > 3.88* 4.00* 4.04* 3.00* 2.43*  CALCIUM 9.5   < > 9.4   < > 9.3   < > 9.1 8.9 9.3 8.8* 8.6*  MG  --   --  2.6*  --  2.4  --   --  2.2  --  2.1  --   PHOS 4.3  --   --   --   --   --   --  4.7*  --  4.0  --    < > = values in this interval not displayed.   GFR: Estimated Creatinine Clearance: 14 mL/min (A) (by C-G formula based on SCr of 2.43 mg/dL (H)). Liver Function Tests: Recent Labs  Lab 06/06/19 2023 06/07/19 0612  AST 26  --   ALT 23  --   ALKPHOS 68  --   BILITOT 0.6  --   PROT 6.3*  --   ALBUMIN 3.5 3.3*   No results for  input(s): LIPASE, AMYLASE in the last 168 hours. No results for input(s): AMMONIA in the last 168 hours. Coagulation Profile: Recent Labs  Lab 06/07/19 1105  INR 1.2   Cardiac Enzymes: No results for input(s): CKTOTAL, CKMB, CKMBINDEX, TROPONINI in the last 168 hours. BNP (last 3 results) No results for input(s): PROBNP in the last 8760 hours. HbA1C: No results for input(s): HGBA1C in the last 72 hours. CBG: Recent Labs  Lab 06/12/19 1121 06/12/19 1610 06/12/19 2119 06/13/19 0707 06/13/19 1100  GLUCAP 134* 335* 271* 95 141*   Lipid Profile: No results for input(s): CHOL, HDL, LDLCALC, TRIG, CHOLHDL, LDLDIRECT in the last 72 hours. Thyroid Function Tests: No results for input(s): TSH, T4TOTAL, FREET4, T3FREE, THYROIDAB in the last 72 hours. Anemia Panel: Recent Labs    06/11/19 0434  FERRITIN 169   Sepsis Labs: Recent Labs  Lab 06/09/19 1030 06/10/19 0424  PROCALCITON 0.67 6.80    Recent Results (from the past 240 hour(s))  Respiratory Panel by RT PCR (Flu A&B, Covid) - Nasopharyngeal Swab     Status: None   Collection Time: 06/05/19  2:27 AM   Specimen: Nasopharyngeal Swab  Result Value Ref Range Status   SARS Coronavirus 2 by RT PCR NEGATIVE NEGATIVE Final    Comment: (NOTE) SARS-CoV-2 target nucleic acids are NOT DETECTED. The SARS-CoV-2 RNA is generally detectable in upper respiratoy specimens during the acute phase of infection. The lowest concentration of SARS-CoV-2 viral copies this assay can detect is 131 copies/mL. A negative result does not preclude SARS-Cov-2 infection and should not be used as the sole basis for treatment or other patient management decisions. A negative result may occur with  improper specimen collection/handling, submission of specimen other than nasopharyngeal swab, presence of viral mutation(s) within the areas targeted by this assay, and  inadequate number of viral copies (<131 copies/mL). A negative result must be combined  with clinical observations, patient history, and epidemiological information. The expected result is Negative. Fact Sheet for Patients:  PinkCheek.be Fact Sheet for Healthcare Providers:  GravelBags.it This test is not yet ap proved or cleared by the [place] FDA and  has been authorized for detection and/or diagnosis of SARS-CoV-2 by FDA under an Emergency Use Authorization (EUA). This EUA will remain  in effect (meaning this test can be used) for the duration of the COVID-19 declaration under Section 564(b)(1) of the Act, 21 U.S.C. section 360bbb-3(b)(1), unless the authorization is terminated or revoked sooner.    Influenza A by PCR NEGATIVE NEGATIVE Final   Influenza B by PCR NEGATIVE NEGATIVE Final    Comment: (NOTE) The Xpert Xpress SARS-CoV-2/FLU/RSV assay is intended as an aid in  the diagnosis of influenza from Nasopharyngeal swab specimens and  should not be used as a sole basis for treatment. Nasal washings and  aspirates are unacceptable for Xpert Xpress SARS-CoV-2/FLU/RSV  testing. Fact Sheet for Patients: PinkCheek.be Fact Sheet for Healthcare Providers: GravelBags.it This test is not yet approved or cleared by the [place] FDA and  has been authorized for detection and/or diagnosis of SARS-CoV-2 by  FDA under an Emergency Use Authorization (EUA). This EUA will remain  in effect (meaning this test can be used) for the duration of the  Covid-19 declaration under Section 564(b)(1) of the Act, 21  U.S.C. section 360bbb-3(b)(1), unless the authorization is  terminated or revoked. Performed at [facility], [address] [place], [place] [address]   MRSA PCR Screening     Status: None   Collection Time: 06/07/19 12:53 AM   Specimen: Nasopharyngeal  Result Value Ref Range Status   MRSA by PCR NEGATIVE NEGATIVE Final    Comment:         The GeneXpert MRSA Assay (FDA approved for NASAL specimens only), is one component of a comprehensive MRSA colonization surveillance program. It is not intended to diagnose MRSA infection nor to guide or monitor treatment for MRSA infections. Performed at [facility], [address] [place], [place] [address]   Culture, respiratory (non-expectorated)     Status: None   Collection Time: 06/09/19 10:11 AM   Specimen: Tracheal Aspirate; Respiratory  Result Value Ref Range Status   Specimen Description   Final    TRACHEAL ASPIRATE Performed at [facility], [address], [place], [place] [address]    Special Requests   Final    NONE Performed at [facility], [address] [place], [place] [address]    Gram Stain   Final    MODERATE WBC PRESENT, PREDOMINANTLY PMN MODERATE GRAM POSITIVE COCCI IN PAIRS IN CHAINS FEW GRAM VARIABLE ROD    Culture   Final    FEW Consistent with normal respiratory flora. Performed at [facility], [place] [address], [place], [place] [address]    Report Status 06/12/2019 FINAL  Final         Radiology Studies: No results found.      Scheduled Meds: . aspirin  81 mg Oral Daily  . atorvastatin  20 mg Oral q1800  . brimonidine  1 drop Both Eyes BID  . carvedilol  12.5 mg Oral BID  . Chlorhexidine Gluconate Cloth  6 each Topical Daily  . cyanocobalamin  1,000 mcg Intramuscular Q1400  . dorzolamide-timolol  1 drop Both Eyes BID  . epoetin (EPOGEN/PROCRIT)  injection  10,000 Units Subcutaneous Weekly  . famotidine  20 mg Oral QHS  . insulin aspart  0-5 Units Subcutaneous QHS  . insulin aspart  0-6 Units Subcutaneous TID WC  . insulin aspart  3 Units Subcutaneous TID WC  . ipratropium-albuterol  3 mL Nebulization QID  . latanoprost  1 drop Left Eye QHS  . mouth rinse  15 mL Mouth Rinse BID  . olopatadine  1 drop Both Eyes BID  . senna-docusate  2 tablet Oral BID  . sertraline  25 mg Oral Daily  .  sodium chloride flush  3 mL Intravenous Q12H  . torsemide  40 mg Oral Daily   Continuous Infusions: . sodium chloride    . cefTAZidime (FORTAZ)  IV       LOS: 8 days    Time spent: 35 minutes    Sidney Ace, MD [facility] Pager [phone]  If 7PM-7AM, please contact night-coverage 06/13/2019, 1:51 PM

## 2019-06-13 NOTE — Progress Notes (Signed)
Inpatient Diabetes Program Recommendations  AACE/ADA: New Consensus Statement on Inpatient Glycemic Control   Target Ranges:  Prepandial:   less than 140 mg/dL      Peak postprandial:   less than 180 mg/dL (1-2 hours)      Critically ill patients:  140 - 180 mg/dL   Results for Lori Stanley, Lori Stanley (MRN [id number]) as of 06/13/2019 10:28  Ref. Range 06/12/2019 07:18 06/12/2019 11:21 06/12/2019 16:10 06/12/2019 21:19 06/13/2019 07:07  Glucose-Capillary Latest Ref Range: 70 - 99 mg/dL 113 (H) 134 (H) 335 (H) 271 (H) 95   Review of Glycemic Control  Diabetes history: DM2 Outpatient Diabetes medications: Humalog 75/25 2 units BID Current orders for Inpatient glycemic control: Novolog 0-6 units TID with meals, Novolog 0-5 units QHS  Inpatient Diabetes Program Recommendations:   Insulin-Meal Coverage: Please consider ordering Novolog 3 units TID with meals for meal coverage if patient eats at least 50% of meals.  Thanks, Barnie Alderman, RN, MSN, CDE Diabetes Coordinator Inpatient Diabetes Program [phone] (Team Pager from 8am to 5pm)

## 2019-06-13 NOTE — Progress Notes (Signed)
HD STARTED  

## 2019-06-13 NOTE — Progress Notes (Signed)
SLP Cancellation Note  Patient Details Name: Lori Stanley MRN: [id number] DOB: Jul 20, 1931   Cancelled treatment:       Reason Eval/Treat Not Completed: Patient at procedure or test/unavailable(chart review; pt on HD). NSG stated pt ate breakfast this AM w/ No overt s/s of aspiration noted; no concerns. ST services will f/u w/ pt's swallowing and trials to upgrade diet tomorrow. Rec. Aspiration precautions.    Orinda Kenner, [place], CCC-SLP Dalesha Stanback 06/13/2019, 2:31 PM

## 2019-06-13 NOTE — Progress Notes (Signed)
Pre hd 

## 2019-06-13 NOTE — Progress Notes (Signed)
Pt transferred to RM # [id number] at this time. VSS prior to transfer.

## 2019-06-13 NOTE — Progress Notes (Signed)
Tx ended    

## 2019-06-14 ENCOUNTER — Ambulatory Visit: Payer: Medicare Other | Admitting: Family

## 2019-06-14 LAB — BASIC METABOLIC PANEL
Anion gap: 7 (ref 5–15)
BUN: 26 mg/dL — ABNORMAL HIGH (ref 8–23)
CO2: 33 mmol/L — ABNORMAL HIGH (ref 22–32)
Calcium: 9.2 mg/dL (ref 8.9–10.3)
Chloride: 99 mmol/L (ref 98–111)
Creatinine, Ser: 2.2 mg/dL — ABNORMAL HIGH (ref 0.44–1.00)
GFR calc Af Amer: 23 mL/min — ABNORMAL LOW (ref >60)
GFR calc non Af Amer: 20 mL/min — ABNORMAL LOW (ref >60)
Glucose, Bld: 97 mg/dL (ref 70–99)
Potassium: 3.5 mmol/L (ref 3.5–5.1)
Sodium: 139 mmol/L (ref 135–145)

## 2019-06-14 LAB — CBC WITH DIFFERENTIAL/PLATELET
Abs Immature Granulocytes: 0.04 10*3/uL (ref 0.00–0.07)
Basophils Absolute: 0 10*3/uL (ref 0.0–0.1)
Basophils Relative: 0 %
Eosinophils Absolute: 0.2 10*3/uL (ref 0.0–0.5)
Eosinophils Relative: 2 %
HCT: 27 % — ABNORMAL LOW (ref 36.0–46.0)
Hemoglobin: 8 g/dL — ABNORMAL LOW (ref 12.0–15.0)
Immature Granulocytes: 1 %
Lymphocytes Relative: 13 %
Lymphs Abs: 1 10*3/uL (ref 0.7–4.0)
MCH: 26.1 pg (ref 26.0–34.0)
MCHC: 29.6 g/dL — ABNORMAL LOW (ref 30.0–36.0)
MCV: 87.9 fL (ref 80.0–100.0)
Monocytes Absolute: 0.8 10*3/uL (ref 0.1–1.0)
Monocytes Relative: 12 %
Neutro Abs: 5.1 10*3/uL (ref 1.7–7.7)
Neutrophils Relative %: 72 %
Platelets: 253 10*3/uL (ref 150–400)
RBC: 3.07 MIL/uL — ABNORMAL LOW (ref 3.87–5.11)
RDW: 14.5 % (ref 11.5–15.5)
WBC: 7.1 10*3/uL (ref 4.0–10.5)
nRBC: 0 % (ref 0.0–0.2)

## 2019-06-14 LAB — GLUCOSE, CAPILLARY
Glucose-Capillary: 76 mg/dL (ref 70–99)
Glucose-Capillary: 94 mg/dL (ref 70–99)
Glucose-Capillary: 167 mg/dL — ABNORMAL HIGH (ref 70–99)
Glucose-Capillary: 135 mg/dL — ABNORMAL HIGH (ref 70–99)
Glucose-Capillary: 94 mg/dL (ref 70–99)

## 2019-06-14 MED ORDER — BUDESONIDE 0.5 MG/2ML IN SUSP
0.5000 mg | Freq: Two times a day (BID) | RESPIRATORY_TRACT | Status: DC
  Administered 2019-06-15 – 2019-06-17 (×5): 0.5 mg via RESPIRATORY_TRACT
  Filled 2019-06-14 (×6): qty 2

## 2019-06-14 MED ORDER — HEPARIN SODIUM (PORCINE) 5000 UNIT/ML IJ SOLN
5000.0000 [IU] | Freq: Three times a day (TID) | INTRAMUSCULAR | Status: DC
  Administered 2019-06-14 – 2019-06-17 (×9): 5000 [IU] via SUBCUTANEOUS
  Filled 2019-06-14 (×9): qty 1

## 2019-06-14 MED ORDER — IPRATROPIUM-ALBUTEROL 0.5-2.5 (3) MG/3ML IN SOLN
3.0000 mL | Freq: Three times a day (TID) | RESPIRATORY_TRACT | Status: DC
  Administered 2019-06-14 – 2019-06-17 (×8): 3 mL via RESPIRATORY_TRACT
  Filled 2019-06-14 (×8): qty 3

## 2019-06-14 NOTE — Progress Notes (Signed)
Following patient to set up outpatient hemodialysis, patient chose [facility]. Do not have a chair time at this moment, however patient will be on a MWF shift. Waiting on financial clearance.

## 2019-06-14 NOTE — Progress Notes (Signed)
[facility], [place] 06/14/19  Subjective:   Hospital day # 9 Patient is tolerating HD well Eating well   HEMODIALYSIS FLOWSHEET:  Blood Flow Rate (mL/min): 300 mL/min Arterial Pressure (mmHg): -120 mmHg Venous Pressure (mmHg): 80 mmHg Transmembrane Pressure (mmHg): 60 mmHg Ultrafiltration Rate (mL/min): 340 mL/min Dialysate Flow Rate (mL/min): 600 ml/min Conductivity: Machine : 13.7 Conductivity: Machine : 13.7 Dialysis Fluid Bolus: Normal Saline Bolus Amount (mL): 250 mL Dialysate Change: 4K     Objective:  Vital signs in last 24 hours:  Temp:  [98 F (36.7 C)-98.4 F (36.9 C)] 98.4 F (36.9 C) (02/12 1310) Pulse Rate:  [73-99] 81 (02/12 1615) Resp:  [18-31] 20 (02/12 1615) BP: (115-183)/(52-101) 156/61 (02/12 1615) SpO2:  [90 %-100 %] 100 % (02/12 1615) Weight:  [57.5 kg] 57.5 kg (02/12 0500)  Weight change: 0.5 kg Filed Weights   06/13/19 0424 06/13/19 1015 06/14/19 0500  Weight: 61.1 kg 61.1 kg 57.5 kg    Intake/Output:    Intake/Output Summary (Last 24 hours) at 06/14/2019 1632 Last data filed at 06/14/2019 0900 Gross per 24 hour  Intake 340 ml  Output 250 ml  Net 90 ml     Physical Exam: General:  Chronically ill-appearing, laying in the bed  HEENT  anicteric, moist oral membranes  Pulm/lungs  normal effort, clear to auscultation  CVS/Heart  regular, no rub  Abdomen:   Soft  Extremities:  Trace peripheral edema present  Neurologic:  Alert and able to follow simple commands  Skin:  Warm  Access:  Right IJ PermCath       Basic Metabolic Panel:  Recent Labs  Lab 06/08/19 0326 06/09/19 0448 06/10/19 0424 06/10/19 0424 06/11/19 0434 06/11/19 0434 06/12/19 0435 06/13/19 0322 06/14/19 0833  NA 139   < > 137  --  142  --  142 140 139  K 3.7   < > 3.7  --  3.8  --  3.3* 3.5 3.5  CL 104   < > 104  --  107  --  104 102 99  CO2 24   < > 21*  --  22  --  31 30 33*  GLUCOSE 113*   < > 114*  --  101*  --  129* 89  97  BUN 65*   < > 71*  --  79*  --  50* 43* 26*  CREATININE 3.75*   < > 4.00*  --  4.04*  --  3.00* 2.43* 2.20*  CALCIUM 9.3   < > 8.9   < > 9.3   < > 8.8* 8.6* 9.2  MG 2.4  --  2.2  --   --   --  2.1  --   --   PHOS  --   --  4.7*  --   --   --  4.0  --   --    < > = values in this interval not displayed.     CBC: Recent Labs  Lab 06/09/19 0448 06/11/19 0434 06/12/19 0435 06/13/19 0322 06/14/19 0833  WBC 9.1 11.7* 9.2 6.8 7.1  NEUTROABS  --   --  7.2 4.7 5.1  HGB 8.9* 7.8* 7.7* 7.4* 8.0*  HCT 26.9* 25.4* 26.2* 25.1* 27.0*  MCV 81.0 86.1 88.5 88.7 87.9  PLT 250 249 249 214 253      Lab Results  Component Value Date   HEPBSAG NON REACTIVE 06/07/2019   HEPBSAB NON REACTIVE 06/07/2019   HEPBIGM NON  REACTIVE 06/07/2019      Microbiology:  Recent Results (from the past 240 hour(s))  Respiratory Panel by RT PCR (Flu A&B, Covid) - Nasopharyngeal Swab     Status: None   Collection Time: 06/05/19  2:27 AM   Specimen: Nasopharyngeal Swab  Result Value Ref Range Status   SARS Coronavirus 2 by RT PCR NEGATIVE NEGATIVE Final    Comment: (NOTE) SARS-CoV-2 target nucleic acids are NOT DETECTED. The SARS-CoV-2 RNA is generally detectable in upper respiratoy specimens during the acute phase of infection. The lowest concentration of SARS-CoV-2 viral copies this assay can detect is 131 copies/mL. A negative result does not preclude SARS-Cov-2 infection and should not be used as the sole basis for treatment or other patient management decisions. A negative result may occur with  improper specimen collection/handling, submission of specimen other than nasopharyngeal swab, presence of viral mutation(s) within the areas targeted by this assay, and inadequate number of viral copies (<131 copies/mL). A negative result must be combined with clinical observations, patient history, and epidemiological information. The expected result is Negative. Fact Sheet for Patients:   PinkCheek.be Fact Sheet for Healthcare Providers:  GravelBags.it This test is not yet ap proved or cleared by the [place] FDA and  has been authorized for detection and/or diagnosis of SARS-CoV-2 by FDA under an Emergency Use Authorization (EUA). This EUA will remain  in effect (meaning this test can be used) for the duration of the COVID-19 declaration under Section 564(b)(1) of the Act, 21 U.S.C. section 360bbb-3(b)(1), unless the authorization is terminated or revoked sooner.    Influenza A by PCR NEGATIVE NEGATIVE Final   Influenza B by PCR NEGATIVE NEGATIVE Final    Comment: (NOTE) The Xpert Xpress SARS-CoV-2/FLU/RSV assay is intended as an aid in  the diagnosis of influenza from Nasopharyngeal swab specimens and  should not be used as a sole basis for treatment. Nasal washings and  aspirates are unacceptable for Xpert Xpress SARS-CoV-2/FLU/RSV  testing. Fact Sheet for Patients: PinkCheek.be Fact Sheet for Healthcare Providers: GravelBags.it This test is not yet approved or cleared by the [place] FDA and  has been authorized for detection and/or diagnosis of SARS-CoV-2 by  FDA under an Emergency Use Authorization (EUA). This EUA will remain  in effect (meaning this test can be used) for the duration of the  Covid-19 declaration under Section 564(b)(1) of the Act, 21  U.S.C. section 360bbb-3(b)(1), unless the authorization is  terminated or revoked. Performed at [facility], [address] [place], [place] [address]   MRSA PCR Screening     Status: None   Collection Time: 06/07/19 12:53 AM   Specimen: Nasopharyngeal  Result Value Ref Range Status   MRSA by PCR NEGATIVE NEGATIVE Final    Comment:        The GeneXpert MRSA Assay (FDA approved for NASAL specimens only), is one component of a comprehensive MRSA colonization surveillance  program. It is not intended to diagnose MRSA infection nor to guide or monitor treatment for MRSA infections. Performed at [facility], [address] [place], [place] [address]   Culture, respiratory (non-expectorated)     Status: None   Collection Time: 06/09/19 10:11 AM   Specimen: Tracheal Aspirate; Respiratory  Result Value Ref Range Status   Specimen Description   Final    TRACHEAL ASPIRATE Performed at [facility], [address] [place], [place] [address]    Special Requests   Final    NONE Performed at  [address] [place], [place] [address]    Gram Stain   Final    MODERATE WBC PRESENT, PREDOMINANTLY PMN MODERATE GRAM POSITIVE COCCI IN PAIRS IN CHAINS FEW GRAM VARIABLE ROD    Culture   Final    FEW Consistent with normal respiratory flora. Performed at [facility], [place] [address], [place], [place] [address]    Report Status 06/12/2019 FINAL  Final    Coagulation Studies: No results for input(s): LABPROT, INR in the last 72 hours.  Urinalysis: No results for input(s): COLORURINE, LABSPEC, PHURINE, GLUCOSEU, HGBUR, BILIRUBINUR, KETONESUR, PROTEINUR, UROBILINOGEN, NITRITE, LEUKOCYTESUR in the last 72 hours.  Invalid input(s): APPERANCEUR    Imaging: DG Chest Port 1 View  Result Date: 06/13/2019 CLINICAL DATA:  In stage renal disease EXAM: PORTABLE CHEST 1 VIEW COMPARISON:  06/11/2019 FINDINGS: Right dialysis catheter remains in place, unchanged. Heart is borderline in size. Airspace opacity in the left lower lung. Small bilateral effusions. Improved vascular congestion and perihilar opacities. IMPRESSION: Improving vascular congestion and perihilar opacities, possibly edema. Continued focal opacity in the left lower lobe with small left pleural effusion, unchanged. Electronically Signed   By: Rolm Baptise M.D.   On: 06/13/2019 19:06     Medications:   . sodium chloride 250 mL (06/13/19 2145)  .  cefTAZidime (FORTAZ)  IV Stopped (06/13/19 2215)   . aspirin  81 mg Oral Daily  . atorvastatin  20 mg Oral q1800  . brimonidine  1 drop Both Eyes BID  . budesonide  0.5 mg Nebulization BID  . carvedilol  12.5 mg Oral BID  . Chlorhexidine Gluconate Cloth  6 each Topical Daily  . dorzolamide-timolol  1 drop Both Eyes BID  . epoetin (EPOGEN/PROCRIT) injection  10,000 Units Subcutaneous Weekly  . famotidine  20 mg Oral QHS  . heparin injection (subcutaneous)  5,000 Units Subcutaneous Q8H  . insulin aspart  0-5 Units Subcutaneous QHS  . insulin aspart  0-6 Units Subcutaneous TID WC  . insulin aspart  3 Units Subcutaneous TID WC  . ipratropium-albuterol  3 mL Nebulization TID  . latanoprost  1 drop Left Eye QHS  . mouth rinse  15 mL Mouth Rinse BID  . olopatadine  1 drop Both Eyes BID  . senna-docusate  2 tablet Oral BID  . sertraline  25 mg Oral Daily  . sodium chloride flush  3 mL Intravenous Q12H  . torsemide  40 mg Oral Daily   sodium chloride, acetaminophen, ALPRAZolam, bisacodyl, hydrALAZINE, ondansetron (ZOFRAN) IV, sodium chloride flush, traZODone  Assessment/ Plan:  86 y.o.African [place] female with diabetes mellitus type II, hypertension, anemia, GERD, depression, anxiety, glaucoma, hyperlipidemia, diastolic congestive heart failure, coronary artery disease admitted on 06/05/2019 for SOB (shortness of breath) [R06.02] Acute CHF (congestive heart failure) ([place]) [I50.9] Acute respiratory failure with hypoxia (H[facility]) [J96.01] Heart failure (HCC) [I50.9] Acute on chronic congestive heart failure, unspecified heart failure type ([place]) [I50.9]   1.  End-stage renal disease Chronic kidney disease is likely secondary to diabetic nephropathy and hypertensive nephrosclerosis.  This admission, patient presented via EMS from home for respiratory distress, shortness of breath, worsening lower extremity edema.  She has failed outpatient volume management. Dialysis catheter was placed this  admission and started on chronic dialysis, which she is tolerating well  Outpatient dialysis placement in progress. [facility]  Monday-Friday twice a week dialysis Patient dialyzed on Friday in chair Ready for discharge from renal standpoint  2.  Acute respiratory failure  Hospital course complicated by respiratory arrest the night of February 4  Responded well to furosemide infusion,  Volume status appears to have optimized Extubated February 8 Continue oral torsemide  3. Diabetes type 2 with CKD Lab Results  Component Value Date   HGBA1C 6.0 (H) 04/10/2019   4. Anemia of CKD -  Lab Results  Component Value Date   HGB 8.0 (L) 06/14/2019   EPO SQ, saturday  5. [facility] Lab Results  Component Value Date   PTH 148 (H) 06/07/2019   CALCIUM 9.2 06/14/2019   PHOS 4.0 06/12/2019   Monitor phos     LOS: [place] 2/12/20214:32 PM  [place], [place]  Note: This note was prepared with Dragon dictation. Any transcription errors are unintentional

## 2019-06-14 NOTE — Care Management Important Message (Signed)
Important Message  Patient Details  Name: Lori Stanley MRN: [id number] Date of Birth: 1931-05-26   Medicare Important Message Given:  Yes     Dannette Barbara 06/14/2019, 1:26 PM

## 2019-06-14 NOTE — Progress Notes (Signed)
[name]  PROGRESS NOTE    Lori Stanley  [id number] DOB: [birth year] DOA: 06/05/2019 PCP: Jerrol Banana., MD   Brief Narrative: HPI: Lori Stanley  is a 86 y.o. female with a known history of CHF, hypertension, dyslipidemia and coronary artery disease, presented to the emergency room with an onset of worsening dyspnea over the last few days with associated dry cough as well as worsening lower extremity edema, orthopnea and occasional paroxysmal nocturnal dyspnea.  No dysuria, oliguria or hematuria or flank pain.  No chest pain or palpitations.  No headache or dizziness or blurred vision.  Upon presentation to the emergency room, blood pressure was 166/65 with respirate of 28 and pulse symmetry was 81% on 2 L of O2 by nasal cannula when he was placed on BiPAP with pulse oximetry improvement 100%.  Labs revealed a BUN of 61 and creatinine 3.86 compared to 40 and 3.2 on 04/10/2019.  BNP was 577 and high-sensitivity troponin was 3.  COVID-19 PCR came back negative.  Chest x-ray showed early CHF.  Influenza antigens came back negative.  EKG showed normal sinus rhythm with rate of 67 with probable left atrial enlargement  The patient was given 40 mg of IV Lasix.  He will be admitted to telemetry bed for further evaluation and management.  Chronology per ICU: 06/08/19 - patient has been diuresing well, we did ultrasound asessment at bedside and pleural effusion is now small.  06/09/19 -patient has thickened airway secretions this am which were sent for micro, empiric abx broadened, attempting weaning SBT trial today.  Discussed care plan with niece [name] today.  06/10/19 -airway secretions are significantly improved. Will perform SBT today and liberate from MV 06/11/19- patient has clinically improved, for swallow eval today, develops atelectasis working on chest PT today, CXR today 06/12/19- participating with RT to use MetaNEB, optimizing medically for medsurg with telemetry. Diet advanced. HD  today.  2/10: Patient seen and examined.  Remains in stepdown status.  Visibly appears well.  Working with respiratory therapy.  Diet advanced.  Lasix GTT stopped per nephrology.  Anticipate medical readiness for downgrade to MedSurg status within 24 to 48 hours.  Discussed with ICU attending.  2/11: Patient seen and examined.  Improved energy levels.  In good spirits.  Optimized for transfer to general medical floor.  Discussed with ICU attending.  Transfer orders placed to MedSurg bed.  Respiratory status remains stable over interval.  Currently requiring 5 L high flow nasal cannula.  Receiving hemodialysis treatment today.  Ultrafiltration as tolerated.  2/12: Patient seen and examined.  Continues to improve.  Down to 3 L nasal cannula.  Hemodialysis per nephrology.   Assessment & Plan:   Active Problems:   Acute on chronic diastolic CHF (congestive heart failure) (HCC)   Anemia of chronic disease   Essential hypertension   Type 2 diabetes mellitus with hyperlipidemia (HCC)   Acute on chronic heart failure with preserved ejection fraction (HFpEF) (HCC)   Heart failure (HCC)   Edema of both lower extremities   ESRD (end stage renal disease) ([place])  Acute hypoxic respiratory failure.   Patient was extubated 06/10/2019.   On high flow nasal cannula, titrated to 5 L nasal cannula this morning On empiric Fortaz/doxy, will plan for empiric 7-day course Lasix drip stopped per nephrology, IV Lasix also stopped On p.o. torsemide per nephrology Maintain strict ins and outs Markedly net negative, -7 L since admission Continue to titrate off oxygen as tolerated  End-stage renal disease  on hemodialysis  Catheter placed in the right chest.   Lasix drip stopped after initiation of hemodialysis Anticipate patient will need long-term dialysis Nephrology on board, will look for outpatient HD chair Per case manager notes patient has a outpatient HD chair at [facility].  No chair time however  patient will be on Monday Wednesday Friday shift.  Awaiting final clearance  Acute on chronic diastolic congestive heart failure   Continue low-dose Coreg, dose decreased per cardiology to allow for more ultrafiltration during dialysis Once daily torsemide  Type 2 diabetes mellitus with hyperlipidemia  on Lipitor   Sliding scale insulin 3 units NovoLog 3 times daily with meals  Glaucoma  Ophthalmic gtt.  Essential hypertension Coreg as above  Depression Zoloft  Anemia of chronic disease Vitamin B12 deficiency Continue to monitor closely No indication to transfuse packed red cells at this moment IM B12 shots complete   DVT prophylaxis: Serial compression devices Code Status: Full code Family Communication: Voicemail left with niece [name] 06/14/19 Disposition Plan: Home with home health, pending arrangement of outpatient HD chair and titration off the nasal cannula  Consultants:   Cardiology  Nephrology  Critical care  Procedures:   Endotracheal intubation, now extubated  Temporary dialysis catheter placement  Antimicrobials:  Ceftazidime  Doxycycline   Subjective: Seen and examined No acute status changes overnight On 3 L nasal cannula Mentating well, in good spirits  Objective: Vitals:   06/14/19 1318 06/14/19 1325 06/14/19 1330 06/14/19 1345  BP: (!) 183/71 (!) 183/69 (!) 159/73 (!) 124/54  Pulse: 81 80 80 89  Resp: (!) 28 (!) 21 (!) 27 (!) 25  Temp:      TempSrc:      SpO2:      Weight:      Height:        Intake/Output Summary (Last 24 hours) at 06/14/2019 1350 Last data filed at 06/14/2019 0900 Gross per 24 hour  Intake 340 ml  Output 250 ml  Net 90 ml   Filed Weights   06/13/19 0424 06/13/19 1015 06/14/19 0500  Weight: 61.1 kg 61.1 kg 57.5 kg    Examination:  General exam: Appears calm and comfortable  Respiratory system: Scattered crackles bilaterally.  Normal work of breathing.  No audible wheeze Cardiovascular system:  S1-S2, RRR, no murmurs  gastrointestinal system: Abdomen is nondistended, soft and nontender. No organomegaly or masses felt. Normal bowel sounds heard. Central nervous system: Alert and oriented. No focal neurological deficits. Extremities: Symmetric 5 x 5 power. Skin: No rashes, lesions or ulcers Psychiatry: Judgement and insight appear normal. Mood & affect appropriate.     Data Reviewed: I have personally reviewed following labs and imaging studies  CBC: Recent Labs  Lab 06/09/19 0448 06/11/19 0434 06/12/19 0435 06/13/19 0322 06/14/19 0833  WBC 9.1 11.7* 9.2 6.8 7.1  NEUTROABS  --   --  7.2 4.7 5.1  HGB 8.9* 7.8* 7.7* 7.4* 8.0*  HCT 26.9* 25.4* 26.2* 25.1* 27.0*  MCV 81.0 86.1 88.5 88.7 87.9  PLT 250 249 249 214 [id number]   Basic Metabolic Panel: Recent Labs  Lab 06/08/19 0326 06/09/19 0448 06/10/19 0424 06/11/19 0434 06/12/19 0435 06/13/19 0322 06/14/19 0833  NA 139   < > 137 142 142 140 139  K 3.7   < > 3.7 3.8 3.3* 3.5 3.5  CL 104   < > 104 107 104 102 99  CO2 24   < > 21* 22 31 30  33*  GLUCOSE 113*   < >  114* 101* 129* 89 97  BUN 65*   < > 71* 79* 50* 43* 26*  CREATININE 3.75*   < > 4.00* 4.04* 3.00* 2.43* 2.20*  CALCIUM 9.3   < > 8.9 9.3 8.8* 8.6* 9.2  MG 2.4  --  2.2  --  2.1  --   --   PHOS  --   --  4.7*  --  4.0  --   --    < > = values in this interval not displayed.   GFR: Estimated Creatinine Clearance: 15.5 mL/min (A) (by C-G formula based on SCr of 2.2 mg/dL (H)). Liver Function Tests: No results for input(s): AST, ALT, ALKPHOS, BILITOT, PROT, ALBUMIN in the last 168 hours. No results for input(s): LIPASE, AMYLASE in the last 168 hours. No results for input(s): AMMONIA in the last 168 hours. Coagulation Profile: No results for input(s): INR, PROTIME in the last 168 hours. Cardiac Enzymes: No results for input(s): CKTOTAL, CKMB, CKMBINDEX, TROPONINI in the last 168 hours. BNP (last 3 results) No results for input(s): PROBNP in the last 8760 hours.  HbA1C: No results for input(s): HGBA1C in the last 72 hours. CBG: Recent Labs  Lab 06/13/19 1100 06/13/19 1640 06/13/19 2108 06/14/19 0749 06/14/19 1157  GLUCAP 141* 76 137* 94 167*   Lipid Profile: No results for input(s): CHOL, HDL, LDLCALC, TRIG, CHOLHDL, LDLDIRECT in the last 72 hours. Thyroid Function Tests: No results for input(s): TSH, T4TOTAL, FREET4, T3FREE, THYROIDAB in the last 72 hours. Anemia Panel: No results for input(s): VITAMINB12, FOLATE, FERRITIN, TIBC, IRON, RETICCTPCT in the last 72 hours. Sepsis Labs: Recent Labs  Lab 06/09/19 1030 06/10/19 0424  PROCALCITON 0.67 6.80    Recent Results (from the past 240 hour(s))  Respiratory Panel by RT PCR (Flu A&B, Covid) - Nasopharyngeal Swab     Status: None   Collection Time: 06/05/19  2:27 AM   Specimen: Nasopharyngeal Swab  Result Value Ref Range Status   SARS Coronavirus 2 by RT PCR NEGATIVE NEGATIVE Final    Comment: (NOTE) SARS-CoV-2 target nucleic acids are NOT DETECTED. The SARS-CoV-2 RNA is generally detectable in upper respiratoy specimens during the acute phase of infection. The lowest concentration of SARS-CoV-2 viral copies this assay can detect is 131 copies/mL. A negative result does not preclude SARS-Cov-2 infection and should not be used as the sole basis for treatment or other patient management decisions. A negative result may occur with  improper specimen collection/handling, submission of specimen other than nasopharyngeal swab, presence of viral mutation(s) within the areas targeted by this assay, and inadequate number of viral copies (<131 copies/mL). A negative result must be combined with clinical observations, patient history, and epidemiological information. The expected result is Negative. Fact Sheet for Patients:  PinkCheek.be Fact Sheet for Healthcare Providers:  GravelBags.it This test is not yet ap proved or cleared by  the [place] FDA and  has been authorized for detection and/or diagnosis of SARS-CoV-2 by FDA under an Emergency Use Authorization (EUA). This EUA will remain  in effect (meaning this test can be used) for the duration of the COVID-19 declaration under Section 564(b)(1) of the Act, 21 U.S.C. section 360bbb-3(b)(1), unless the authorization is terminated or revoked sooner.    Influenza A by PCR NEGATIVE NEGATIVE Final   Influenza B by PCR NEGATIVE NEGATIVE Final    Comment: (NOTE) The Xpert Xpress SARS-CoV-2/FLU/RSV assay is intended as an aid in  the diagnosis of influenza from Nasopharyngeal swab specimens and  should  not be used as a sole basis for treatment. Nasal washings and  aspirates are unacceptable for Xpert Xpress SARS-CoV-2/FLU/RSV  testing. Fact Sheet for Patients: PinkCheek.be Fact Sheet for Healthcare Providers: GravelBags.it This test is not yet approved or cleared by the [place] FDA and  has been authorized for detection and/or diagnosis of SARS-CoV-2 by  FDA under an Emergency Use Authorization (EUA). This EUA will remain  in effect (meaning this test can be used) for the duration of the  Covid-19 declaration under Section 564(b)(1) of the Act, 21  U.S.C. section 360bbb-3(b)(1), unless the authorization is  terminated or revoked. Performed at [facility], [address] [place], [place] [address]   MRSA PCR Screening     Status: None   Collection Time: 06/07/19 12:53 AM   Specimen: Nasopharyngeal  Result Value Ref Range Status   MRSA by PCR NEGATIVE NEGATIVE Final    Comment:        The GeneXpert MRSA Assay (FDA approved for NASAL specimens only), is one component of a comprehensive MRSA colonization surveillance program. It is not intended to diagnose MRSA infection nor to guide or monitor treatment for MRSA infections. Performed at [facility], [address] [place], [place] [address]   Culture, respiratory (non-expectorated)     Status: None   Collection Time: 06/09/19 10:11 AM   Specimen: Tracheal Aspirate; Respiratory  Result Value Ref Range Status   Specimen Description   Final    TRACHEAL ASPIRATE Performed at [facility], [address], [place], [place] [address]    Special Requests   Final    NONE Performed at [facility], [address] [place], [place] [address]    Gram Stain   Final    MODERATE WBC PRESENT, PREDOMINANTLY PMN MODERATE GRAM POSITIVE COCCI IN PAIRS IN CHAINS FEW GRAM VARIABLE ROD    Culture   Final    FEW Consistent with normal respiratory flora. Performed at [facility], [place] [address], [place], [place] [address]    Report Status 06/12/2019 FINAL  Final         Radiology Studies: DG Chest Port 1 View  Result Date: 06/13/2019 CLINICAL DATA:  In stage renal disease EXAM: PORTABLE CHEST 1 VIEW COMPARISON:  06/11/2019 FINDINGS: Right dialysis catheter remains in place, unchanged. Heart is borderline in size. Airspace opacity in the left lower lung. Small bilateral effusions. Improved vascular congestion and perihilar opacities. IMPRESSION: Improving vascular congestion and perihilar opacities, possibly edema. Continued focal opacity in the left lower lobe with small left pleural effusion, unchanged. Electronically Signed   By: Rolm Baptise M.D.   On: 06/13/2019 19:06        Scheduled Meds: . aspirin  81 mg Oral Daily  . atorvastatin  20 mg Oral q1800  . brimonidine  1 drop Both Eyes BID  . budesonide  0.5 mg Nebulization BID  . carvedilol  12.5 mg Oral BID  . Chlorhexidine Gluconate Cloth  6 each Topical Daily  . dorzolamide-timolol  1 drop Both Eyes BID  . epoetin (EPOGEN/PROCRIT) injection  10,000 Units Subcutaneous Weekly  . famotidine  20 mg Oral QHS  . heparin injection (subcutaneous)  5,000 Units Subcutaneous Q8H  . insulin aspart  0-5 Units Subcutaneous QHS  .  insulin aspart  0-6 Units Subcutaneous TID WC  . insulin aspart  3 Units Subcutaneous TID WC  . ipratropium-albuterol  3 mL Nebulization TID  . latanoprost  1 drop Left  Eye QHS  . mouth rinse  15 mL Mouth Rinse BID  . olopatadine  1 drop Both Eyes BID  . senna-docusate  2 tablet Oral BID  . sertraline  25 mg Oral Daily  . sodium chloride flush  3 mL Intravenous Q12H  . torsemide  40 mg Oral Daily   Continuous Infusions: . sodium chloride 250 mL (06/13/19 2145)  . cefTAZidime (FORTAZ)  IV Stopped (06/13/19 2215)     LOS: 9 days    Time spent: 35 minutes    Sidney Ace, MD [facility] Pager [phone]  If 7PM-7AM, please contact night-coverage 06/14/2019, 1:50 PM

## 2019-06-14 NOTE — Progress Notes (Signed)
OT Cancellation Note  Patient Details Name: AKSHITHA HUPPE MRN: [id number] DOB: 04-15-1932   Cancelled Treatment:    Reason Eval/Treat Not Completed: Patient at procedure or test/ unavailable  Pt off floor at HD at this time. Will f/u for OT tx on next scheduled date as pt becomes available/as time permits. Thank you.  Gerrianne Scale, [place], OTR/L ascom [phone] 06/14/19, 3:03 PM

## 2019-06-14 NOTE — Progress Notes (Signed)
  Speech Language Pathology Treatment: Dysphagia  Patient Details Name: RAENELLE CRYSLER MRN: [id number] DOB: May 01, 1932 Today's Date: 06/14/2019 Time: [id number] SLP Time Calculation (min) (ACUTE ONLY): 34 min  Assessment / Plan / Recommendation Clinical Impression  Patient seen for ongoing assessment of diet toleration and readiness for diet upgrade.  The patient is now able to pull thin liquids via straw with no overt clinical indicators of aspiration.  She is able to masticate a graham cracker and swallow with no significant signs of aspiration or oral residue.  Recommend dysphagia 3 (mechanical soft) diet with thin liquids.  Will follow for diet toleration.   HPI HPI: Pt is a 86 y.o. female with a known history of CHF, hypertension, dyslipidemia and coronary artery disease, presented to the emergency room with an onset of worsening dyspnea over the last few days with associated dry cough as well as worsening lower extremity edema, orthopnea and occasional paroxysmal nocturnal dyspnea. Pt intubated 06/06/19 and transferred to the ICU. Pt extubated 06/10/19. Per CXR yest., Improving pulmonary vascular congestion. Improved aeration at the R lung base.       SLP Plan  Continue with current plan of care       Recommendations  Diet recommendations: Dysphagia 3 (mechanical soft);Thin liquid Liquids provided via: Cup;Straw Medication Administration: Whole meds with puree Supervision: Patient able to self feed;Intermittent supervision to cue for compensatory strategies Compensations: Minimize environmental distractions;Slow rate;Small sips/bites;Lingual sweep for clearance of pocketing;Multiple dry swallows after each bite/sip;Follow solids with liquid Postural Changes and/or Swallow Maneuvers: Seated upright 90 degrees;Upright 30-60 min after meal                Oral Care Recommendations: Oral care BID;Oral care before and after PO;Staff/trained caregiver to provide oral care Follow up  Recommendations: Skilled Nursing facility SLP Visit Diagnosis: Dysphagia, oropharyngeal phase (R13.12) Plan: Continue with current plan of care       GO               Leroy Sea, [place], Susie 06/14/2019, 12:45 PM

## 2019-06-14 NOTE — Progress Notes (Signed)
Physical Therapy Treatment Patient Details Name: Lori Stanley MRN: [id number] DOB: 1932/04/01 Today's Date: 06/14/2019    History of Present Illness pt is an 86 yo female admitted to telemetry unit for acute on chronic hypoxic respiratory failure, pt respiratory arrest on 2/4 requiring mechanical intubation and transfer to ICU. PMH includes CHF, HTN, CAD, DM, MI    PT Comments    PT entered room, pt in chair with RN at bedside. RN informed PT that the pt had been complaining of RUE weakness. RUE assessed, pt denied changes of light touch sensation, vision, or speech. Did exhibit limitations in shoulder and elbow AROM, though improvement noted with time during session, pt able to reach across table when her arm was propped up on a pillow, OT informed.   The patient demonstrated great progress towards goals today. Sit <> stand with minA for initial steadying, pt did exhibit posterior lean, verbal and tactile cues provided. The patient ambulated ~50ft with RW and CGA, 2 standing rest breaks to assess HR and spO2, WFLs on 3L via [place]. Pt exhibited increased knee flexion and decreased step length during gait. Patient up in chair with all needs in reach at end of session. Though the patient exhibited improvement in functional mobility, current recommendation SNF remains appropriate. Pt continued to need physical assist for transfers, has several steps to enter home, and lives alone (pt reported niece "helps out").      Follow Up Recommendations  SNF     Equipment Recommendations  Other (comment)    Recommendations for Other Services       Precautions / Restrictions Precautions Precautions: Fall Restrictions Weight Bearing Restrictions: No    Mobility  Bed Mobility               General bed mobility comments: deferred up in chair at start of session  Transfers Overall transfer level: Needs assistance Equipment used: Rolling walker (2 wheeled) Transfers: Sit to/from  Stand Sit to Stand: Min assist;Min guard         General transfer comment: exhibited posterior lean initially with standing balance  Ambulation/Gait   Gait Distance (Feet): 70 Feet Assistive device: Rolling walker (2 wheeled)   Gait velocity: decreased   General Gait Details: Pt with excessive knee flexion during swing phase of gait, reliant on RW, several standing rest breaks enforced to assess spO2 >90% throughout ambulation on 3L   Stairs             Wheelchair Mobility    Modified Rankin (Stroke Patients Only)       Balance Overall balance assessment: Needs assistance Sitting-balance support: Feet supported Sitting balance-Leahy Scale: Good     Standing balance support: During functional activity Standing balance-Leahy Scale: Fair Standing balance comment: reliant on RW                            Cognition Arousal/Alertness: Awake/alert Behavior During Therapy: WFL for tasks assessed/performed Overall Cognitive Status: Within Functional Limits for tasks assessed                                        Exercises General Exercises - Lower Extremity Ankle Circles/Pumps: AROM;Both;10 reps Long Arc Quad: AROM;Right;Both;10 reps Heel Slides: AROM;Strengthening;Both;5 reps Other Exercises Other Exercises: RUE reassessed due to pt complaints of weakness. Denied any changes in light touche sensation, vision,  speech. Initially AAROM to complete any ROM of R shoulder/elbow, at end of session pt able to reach across table across midline to reach for item. OT informed.    General Comments        Pertinent Vitals/Pain Pain Assessment: No/denies pain    Home Living                      Prior Function            PT Goals (current goals can now be found in the care plan section) Progress towards PT goals: Progressing toward goals    Frequency    Min 2X/week      PT Plan Current plan remains appropriate     Co-evaluation              AM-PAC PT "6 Clicks" Mobility   Outcome Measure  Help needed turning from your back to your side while in a flat bed without using bedrails?: A Little Help needed moving from lying on your back to sitting on the side of a flat bed without using bedrails?: A Little Help needed moving to and from a bed to a chair (including a wheelchair)?: A Little Help needed standing up from a chair using your arms (e.g., wheelchair or bedside chair)?: A Little Help needed to walk in hospital room?: A Little Help needed climbing 3-5 steps with a railing? : A Lot 6 Click Score: 17    End of Session Equipment Utilized During Treatment: Gait belt;Oxygen(3L) Activity Tolerance: Patient tolerated treatment well Patient left: in chair;with chair alarm set;with call bell/phone within reach Nurse Communication: Mobility status PT Visit Diagnosis: Unsteadiness on feet (R26.81);Muscle weakness (generalized) (M62.81);Difficulty in walking, not elsewhere classified (R26.2)     Time: [id number] PT Time Calculation (min) (ACUTE ONLY): 30 min  Charges:  $Therapeutic Exercise: 23-37 mins                     Lieutenant Diego PT, DPT 11:58 AM,06/14/19

## 2019-06-15 LAB — BASIC METABOLIC PANEL
Anion gap: 6 (ref 5–15)
BUN: 11 mg/dL (ref 8–23)
CO2: 31 mmol/L (ref 22–32)
Calcium: 8.9 mg/dL (ref 8.9–10.3)
Chloride: 101 mmol/L (ref 98–111)
Creatinine, Ser: 1.66 mg/dL — ABNORMAL HIGH (ref 0.44–1.00)
GFR calc Af Amer: 32 mL/min — ABNORMAL LOW (ref >60)
GFR calc non Af Amer: 28 mL/min — ABNORMAL LOW (ref >60)
Glucose, Bld: 95 mg/dL (ref 70–99)
Potassium: 3.5 mmol/L (ref 3.5–5.1)
Sodium: 138 mmol/L (ref 135–145)

## 2019-06-15 LAB — CBC WITH DIFFERENTIAL/PLATELET
Abs Immature Granulocytes: 0.05 10*3/uL (ref 0.00–0.07)
Basophils Absolute: 0 10*3/uL (ref 0.0–0.1)
Basophils Relative: 0 %
Eosinophils Absolute: 0.1 10*3/uL (ref 0.0–0.5)
Eosinophils Relative: 2 %
HCT: 23.6 % — ABNORMAL LOW (ref 36.0–46.0)
Hemoglobin: 7.1 g/dL — ABNORMAL LOW (ref 12.0–15.0)
Immature Granulocytes: 1 %
Lymphocytes Relative: 13 %
Lymphs Abs: 0.9 10*3/uL (ref 0.7–4.0)
MCH: 26.2 pg (ref 26.0–34.0)
MCHC: 30.1 g/dL (ref 30.0–36.0)
MCV: 87.1 fL (ref 80.0–100.0)
Monocytes Absolute: 0.7 10*3/uL (ref 0.1–1.0)
Monocytes Relative: 11 %
Neutro Abs: 5.2 10*3/uL (ref 1.7–7.7)
Neutrophils Relative %: 73 %
Platelets: 216 10*3/uL (ref 150–400)
RBC: 2.71 MIL/uL — ABNORMAL LOW (ref 3.87–5.11)
RDW: 14.3 % (ref 11.5–15.5)
WBC: 7.1 10*3/uL (ref 4.0–10.5)
nRBC: 0 % (ref 0.0–0.2)

## 2019-06-15 LAB — GLUCOSE, CAPILLARY
Glucose-Capillary: 89 mg/dL (ref 70–99)
Glucose-Capillary: 148 mg/dL — ABNORMAL HIGH (ref 70–99)
Glucose-Capillary: 101 mg/dL — ABNORMAL HIGH (ref 70–99)
Glucose-Capillary: 114 mg/dL — ABNORMAL HIGH (ref 70–99)

## 2019-06-15 NOTE — Progress Notes (Signed)
[place], [place] 06/15/19  Subjective:  Patient had hemodialysis yesterday. Tolerated well. In good spirits today.   Objective:  Vital signs in last 24 hours:  Temp:  [98.2 F (36.8 C)-98.8 F (37.1 C)] 98.8 F (37.1 C) (02/13 0513) Pulse Rate:  [77-96] 84 (02/13 0513) Resp:  [16-26] 20 (02/13 0513) BP: (154-177)/(61-81) 154/81 (02/13 0513) SpO2:  [92 %-100 %] 93 % (02/13 0513) Weight:  [59.2 kg] 59.2 kg (02/13 0513)  Weight change: -1.9 kg Filed Weights   06/13/19 1015 06/14/19 0500 06/15/19 0513  Weight: 61.1 kg 57.5 kg 59.2 kg    Intake/Output:    Intake/Output Summary (Last 24 hours) at 06/15/2019 1511 Last data filed at 06/15/2019 0515 Gross per 24 hour  Intake 100 ml  Output 950 ml  Net -850 ml     Physical Exam: General:  Chronically ill-appearing, sitting up in chair  HEENT  anicteric, moist oral membranes  Pulm/lungs  normal effort, clear to auscultation  CVS/Heart  regular, no rub  Abdomen:   Soft  Extremities:  Trace peripheral edema present  Neurologic:  Alert and able to follow simple commands  Skin:  Warm  Access:  Right IJ PermCath       Basic Metabolic Panel:  Recent Labs  Lab 06/10/19 0424 06/10/19 0424 06/11/19 0434 06/11/19 0434 06/12/19 0435 06/12/19 0435 06/13/19 0322 06/14/19 0833 06/15/19 0405  NA 137   < > 142  --  142  --  140 139 138  K 3.7   < > 3.8  --  3.3*  --  3.5 3.5 3.5  CL 104   < > 107  --  104  --  102 99 101  CO2 21*   < > 22  --  31  --  30 33* 31  GLUCOSE 114*   < > 101*  --  129*  --  89 97 95  BUN 71*   < > 79*  --  50*  --  43* 26* 11  CREATININE 4.00*   < > 4.04*  --  3.00*  --  2.43* 2.20* 1.66*  CALCIUM 8.9   < > 9.3   < > 8.8*   < > 8.6* 9.2 8.9  MG 2.2  --   --   --  2.1  --   --   --   --   PHOS 4.7*  --   --   --  4.0  --   --   --   --    < > = values in this interval not displayed.     CBC: Recent Labs  Lab 06/11/19 0434 06/12/19 0435 06/13/19 0322  06/14/19 0833 06/15/19 0405  WBC 11.7* 9.2 6.8 7.1 7.1  NEUTROABS  --  7.2 4.7 5.1 5.2  HGB 7.8* 7.7* 7.4* 8.0* 7.1*  HCT 25.4* 26.2* 25.1* 27.0* 23.6*  MCV 86.1 88.5 88.7 87.9 87.1  PLT 249 249 214 253 216      Lab Results  Component Value Date   HEPBSAG NON REACTIVE 06/07/2019   HEPBSAB NON REACTIVE 06/07/2019   HEPBIGM NON REACTIVE 06/07/2019      Microbiology:  Recent Results (from the past 240 hour(s))  MRSA PCR Screening     Status: None   Collection Time: 06/07/19 12:53 AM   Specimen: Nasopharyngeal  Result Value Ref Range Status   MRSA by PCR NEGATIVE NEGATIVE Final    Comment:  The GeneXpert MRSA Assay (FDA approved for NASAL specimens only), is one component of a comprehensive MRSA colonization surveillance program. It is not intended to diagnose MRSA infection nor to guide or monitor treatment for MRSA infections. Performed at [facility], [address] [place], [place] [address]   Culture, respiratory (non-expectorated)     Status: None   Collection Time: 06/09/19 10:11 AM   Specimen: Tracheal Aspirate; Respiratory  Result Value Ref Range Status   Specimen Description   Final    TRACHEAL ASPIRATE Performed at [facility], [address] [place], [place] [address]    Special Requests   Final    NONE Performed at [facility], [address] [place], [place] [address]    Gram Stain   Final    MODERATE WBC PRESENT, PREDOMINANTLY PMN MODERATE GRAM POSITIVE COCCI IN PAIRS IN CHAINS FEW GRAM VARIABLE ROD    Culture   Final    FEW Consistent with normal respiratory flora. Performed at [facility], [place] [address], [place], [place] [address]    Report Status 06/12/2019 FINAL  Final    Coagulation Studies: No results for input(s): LABPROT, INR in the last 72 hours.  Urinalysis: No results for input(s): COLORURINE, LABSPEC, PHURINE, GLUCOSEU, HGBUR, BILIRUBINUR, KETONESUR, PROTEINUR, UROBILINOGEN,  NITRITE, LEUKOCYTESUR in the last 72 hours.  Invalid input(s): APPERANCEUR    Imaging: DG Chest Port 1 View  Result Date: 06/13/2019 CLINICAL DATA:  In stage renal disease EXAM: PORTABLE CHEST 1 VIEW COMPARISON:  06/11/2019 FINDINGS: Right dialysis catheter remains in place, unchanged. Heart is borderline in size. Airspace opacity in the left lower lung. Small bilateral effusions. Improved vascular congestion and perihilar opacities. IMPRESSION: Improving vascular congestion and perihilar opacities, possibly edema. Continued focal opacity in the left lower lobe with small left pleural effusion, unchanged. Electronically Signed   By: Rolm Baptise M.D.   On: 06/13/2019 19:06     Medications:   . sodium chloride Stopped (06/15/19 0256)   . aspirin  81 mg Oral Daily  . atorvastatin  20 mg Oral q1800  . brimonidine  1 drop Both Eyes BID  . budesonide  0.5 mg Nebulization BID  . carvedilol  12.5 mg Oral BID  . Chlorhexidine Gluconate Cloth  6 each Topical Daily  . dorzolamide-timolol  1 drop Both Eyes BID  . epoetin (EPOGEN/PROCRIT) injection  10,000 Units Subcutaneous Weekly  . famotidine  20 mg Oral QHS  . heparin injection (subcutaneous)  5,000 Units Subcutaneous Q8H  . insulin aspart  0-5 Units Subcutaneous QHS  . insulin aspart  0-6 Units Subcutaneous TID WC  . insulin aspart  3 Units Subcutaneous TID WC  . ipratropium-albuterol  3 mL Nebulization TID  . latanoprost  1 drop Left Eye QHS  . mouth rinse  15 mL Mouth Rinse BID  . olopatadine  1 drop Both Eyes BID  . senna-docusate  2 tablet Oral BID  . sertraline  25 mg Oral Daily  . sodium chloride flush  3 mL Intravenous Q12H  . torsemide  40 mg Oral Daily   sodium chloride, acetaminophen, ALPRAZolam, bisacodyl, hydrALAZINE, ondansetron (ZOFRAN) IV, sodium chloride flush, traZODone  Assessment/ Plan:  87 y.o.African [place] female with diabetes mellitus type II, hypertension, anemia, GERD, depression, anxiety, glaucoma,  hyperlipidemia, diastolic congestive heart failure, coronary artery disease admitted on 06/05/2019 for SOB (shortness of breath) [R06.02] Acute CHF (congestive heart failure) ([place]) [I50.9] Acute respiratory failure with hypoxia ([place]) [J96.01] Heart failure ([place]) [  I50.9] Acute on chronic congestive heart failure, unspecified heart failure type ([place]) [I50.9]   1.  End-stage renal disease Chronic kidney disease is likely secondary to diabetic nephropathy and hypertensive nephrosclerosis.  This admission, patient presented via EMS from home for respiratory distress, shortness of breath, worsening lower extremity edema.  She has failed outpatient volume management. Dialysis catheter was placed this admission and started on chronic dialysis, which she is tolerating well  Outpatient dialysis placement in progress. [facility]   -Patient had hemodialysis yesterday.  Tolerated well.  No acute indication for dialysis today.  We will plan for dialysis again on Monday.  2.  Acute respiratory failure Hospital course complicated by respiratory arrest the night of February 4  Responded well to furosemide infusion,  Volume status appears to have optimized Extubated February 8 Continue oral torsemide Respiratory status appears to be stable.  3. Diabetes type 2 with CKD Lab Results  Component Value Date   HGBA1C 6.0 (H) 04/10/2019  Glycemic control as per hospitalist.  4. Anemia of CKD -  Lab Results  Component Value Date   HGB 7.1 (L) 06/15/2019  Hemoglobin is low at 7.1.  Consider transfusion for hemoglobin of 7 or less.  Otherwise maintain the patient on Procrit.  5. SHPTH Lab Results  Component Value Date   PTH 148 (H) 06/07/2019   CALCIUM 8.9 06/15/2019   PHOS 4.0 06/12/2019  Phosphorus at target at 4.0.  Continue to monitor bone mineral metabolism parameters.     LOS: 10 Alvy Alsop 2/13/20213:11 PM  [place], [place]  Note:  This note was prepared with Dragon dictation. Any transcription errors are unintentional

## 2019-06-15 NOTE — Progress Notes (Signed)
Lori Stanley Kitchen  PROGRESS NOTE    Lori Stanley  [id number] DOB: 1931-08-26 DOA: 06/05/2019 PCP: Lori Stanley., MD   Brief Narrative: HPI: Lori Stanley  is a 86 y.o. female with a known history of CHF, hypertension, dyslipidemia and coronary artery disease, presented to the emergency room with an onset of worsening dyspnea over the last few days with associated dry cough as well as worsening lower extremity edema, orthopnea and occasional paroxysmal nocturnal dyspnea.  No dysuria, oliguria or hematuria or flank pain.  No chest pain or palpitations.  No headache or dizziness or blurred vision.  Upon presentation to the emergency room, blood pressure was 166/65 with respirate of 28 and pulse symmetry was 81% on 2 L of O2 by nasal cannula when he was placed on BiPAP with pulse oximetry improvement 100%.  Labs revealed a BUN of 61 and creatinine 3.86 compared to 40 and 3.2 on 04/10/2019.  BNP was 577 and high-sensitivity troponin was 3.  COVID-19 PCR came back negative.  Chest x-ray showed early CHF.  Influenza antigens came back negative.  EKG showed normal sinus rhythm with rate of 67 with probable left atrial enlargement  The patient was given 40 mg of IV Lasix.  He will be admitted to telemetry bed for further evaluation and management.  Chronology per ICU: 06/08/19 - patient has been diuresing well, we did ultrasound asessment at bedside and pleural effusion is now small.  06/09/19 -patient has thickened airway secretions this am which were sent for micro, empiric abx broadened, attempting weaning SBT trial today.  Discussed care plan with niece Lori Stanley today.  06/10/19 -airway secretions are significantly improved. Will perform SBT today and liberate from MV 06/11/19- patient has clinically improved, for swallow eval today, develops atelectasis working on chest PT today, CXR today 06/12/19- participating with RT to use MetaNEB, optimizing medically for medsurg with telemetry. Diet advanced. HD  today.  2/10: Patient seen and examined.  Remains in stepdown status.  Visibly appears well.  Working with respiratory therapy.  Diet advanced.  Lasix GTT stopped per nephrology.  Anticipate medical readiness for downgrade to MedSurg status within 24 to 48 hours.  Discussed with ICU attending.  2/11: Patient seen and examined.  Improved energy levels.  In good spirits.  Optimized for transfer to general medical floor.  Discussed with ICU attending.  Transfer orders placed to MedSurg bed.  Respiratory status remains stable over interval.  Currently requiring 5 L high flow nasal cannula.  Receiving hemodialysis treatment today.  Ultrafiltration as tolerated.  2/12: Patient seen and examined.  Continues to improve.  Down to 3 L nasal cannula.  Hemodialysis per nephrology.  2/13: Patient seen and examined.  Continues to show clinical improvement.  In good spirits this morning.  Wants to get home.  Remains on 3 L nasal cannula.  Incentive spirometry not provided yesterday.  Instructed the bedside nurse to please get the patient an incentive spirometer and educated on use.   Assessment & Plan:   Active Problems:   Acute on chronic diastolic CHF (congestive heart failure) (HCC)   Anemia of chronic disease   Essential hypertension   Type 2 diabetes mellitus with hyperlipidemia (HCC)   Acute on chronic heart failure with preserved ejection fraction (HFpEF) (HCC)   Heart failure (HCC)   Edema of both lower extremities   ESRD (end stage renal disease) ([place])  Acute hypoxic respiratory failure.   Patient was extubated 06/10/2019.   On high flow nasal cannula, titrated  to 3 L nasal cannula On empiric Fortaz/doxy, stop date 06/15/2019 Lasix drip stopped per nephrology, IV Lasix also stopped On p.o. torsemide per nephrology Maintain strict ins and outs Continue to titrate off oxygen as tolerated Stress incentive spirometry use We will attempt to wean from supplemental oxygen entirely prior to  discharge  End-stage renal disease on hemodialysis  Catheter placed in the right chest.   Lasix drip stopped after initiation of hemodialysis Anticipate patient will need long-term dialysis Nephrology on board, will look for outpatient HD chair Per case manager notes patient has a outpatient HD chair at [facility].   Her last nephrology note patient is set up with outpatient hemodialysis and is cleared for discharge from their standpoint  Acute on chronic diastolic congestive heart failure   Continue low-dose Coreg, dose decreased per cardiology to allow for more ultrafiltration during dialysis Once daily torsemide  Type 2 diabetes mellitus with hyperlipidemia  on Lipitor   Sliding scale insulin 3 units NovoLog 3 times daily with meals  Glaucoma  Ophthalmic gtt.  Essential hypertension Coreg as above  Depression Zoloft  Anemia of chronic disease Vitamin B12 deficiency Continue to monitor closely No indication to transfuse packed red cells at this moment IM B12 shots complete   DVT prophylaxis: Serial compression devices Code Status: Full code Family Communication: Voicemail left with niece Lori Stanley 06/14/19 Disposition Plan: Home with home health, pending liberation from supplemental oxygen  Consultants:   Cardiology  Nephrology  Critical care  Procedures:   Endotracheal intubation, now extubated  Temporary dialysis catheter placement  Antimicrobials:    Subjective: Seen and examined No acute status changes overnight On 3 L nasal cannula Mentating well, in good spirits  Objective: Vitals:   06/14/19 1659 06/14/19 2016 06/14/19 2050 06/15/19 0513  BP: (!) 167/77 (!) 174/73  (!) 154/81  Pulse: 96 84  84  Resp: 18 16  20   Temp: 98.2 F (36.8 C) 98.5 F (36.9 C)  98.8 F (37.1 C)  TempSrc: Oral Oral  Oral  SpO2: 92% 98% 96% 93%  Weight:    59.2 kg  Height:        Intake/Output Summary (Last 24 hours) at 06/15/2019 1036 Last data filed  at 06/15/2019 0515 Gross per 24 hour  Intake 100 ml  Output 950 ml  Net -850 ml   Filed Weights   06/13/19 1015 06/14/19 0500 06/15/19 0513  Weight: 61.1 kg 57.5 kg 59.2 kg    Examination:  General exam: Appears calm and comfortable  Respiratory system: Scattered crackles bilaterally.  Normal work of breathing.  No audible wheeze Cardiovascular system: S1-S2, RRR, no murmurs  gastrointestinal system: Abdomen is nondistended, soft and nontender. No organomegaly or masses felt. Normal bowel sounds heard. Central nervous system: Alert and oriented. No focal neurological deficits. Extremities: Symmetric 5 x 5 power. Skin: No rashes, lesions or ulcers Psychiatry: Judgement and insight appear normal. Mood & affect appropriate.     Data Reviewed: I have personally reviewed following labs and imaging studies  CBC: Recent Labs  Lab 06/11/19 0434 06/12/19 0435 06/13/19 0322 06/14/19 0833 06/15/19 0405  WBC 11.7* 9.2 6.8 7.1 7.1  NEUTROABS  --  7.2 4.7 5.1 5.2  HGB 7.8* 7.7* 7.4* 8.0* 7.1*  HCT 25.4* 26.2* 25.1* 27.0* 23.6*  MCV 86.1 88.5 88.7 87.9 87.1  PLT 249 249 214 253 [id number]   Basic Metabolic Panel: Recent Labs  Lab 06/10/19 0424 06/10/19 0424 06/11/19 0434 06/12/19 0435 06/13/19 0322 06/14/19 [id number]  06/15/19 0405  NA 137   < > 142 142 140 139 138  K 3.7   < > 3.8 3.3* 3.5 3.5 3.5  CL 104   < > 107 104 102 99 101  CO2 21*   < > 22 31 30  33* 31  GLUCOSE 114*   < > 101* 129* 89 97 95  BUN 71*   < > 79* 50* 43* 26* 11  CREATININE 4.00*   < > 4.04* 3.00* 2.43* 2.20* 1.66*  CALCIUM 8.9   < > 9.3 8.8* 8.6* 9.2 8.9  MG 2.2  --   --  2.1  --   --   --   PHOS 4.7*  --   --  4.0  --   --   --    < > = values in this interval not displayed.   GFR: Estimated Creatinine Clearance: 20.5 mL/min (A) (by C-G formula based on SCr of 1.66 mg/dL (H)). Liver Function Tests: No results for input(s): AST, ALT, ALKPHOS, BILITOT, PROT, ALBUMIN in the last 168 hours. No results for  input(s): LIPASE, AMYLASE in the last 168 hours. No results for input(s): AMMONIA in the last 168 hours. Coagulation Profile: No results for input(s): INR, PROTIME in the last 168 hours. Cardiac Enzymes: No results for input(s): CKTOTAL, CKMB, CKMBINDEX, TROPONINI in the last 168 hours. BNP (last 3 results) No results for input(s): PROBNP in the last 8760 hours. HbA1C: No results for input(s): HGBA1C in the last 72 hours. CBG: Recent Labs  Lab 06/14/19 0749 06/14/19 1157 06/14/19 1751 06/14/19 2114 06/15/19 0743  GLUCAP 94 167* 135* 94 89   Lipid Profile: No results for input(s): CHOL, HDL, LDLCALC, TRIG, CHOLHDL, LDLDIRECT in the last 72 hours. Thyroid Function Tests: No results for input(s): TSH, T4TOTAL, FREET4, T3FREE, THYROIDAB in the last 72 hours. Anemia Panel: No results for input(s): VITAMINB12, FOLATE, FERRITIN, TIBC, IRON, RETICCTPCT in the last 72 hours. Sepsis Labs: Recent Labs  Lab 06/09/19 1030 06/10/19 0424  PROCALCITON 0.67 6.80    Recent Results (from the past 240 hour(s))  MRSA PCR Screening     Status: None   Collection Time: 06/07/19 12:53 AM   Specimen: Nasopharyngeal  Result Value Ref Range Status   MRSA by PCR NEGATIVE NEGATIVE Final    Comment:        The GeneXpert MRSA Assay (FDA approved for NASAL specimens only), is one component of a comprehensive MRSA colonization surveillance program. It is not intended to diagnose MRSA infection nor to guide or monitor treatment for MRSA infections. Performed at [facility], [address] [place], [place] [address]   Culture, respiratory (non-expectorated)     Status: None   Collection Time: 06/09/19 10:11 AM   Specimen: Tracheal Aspirate; Respiratory  Result Value Ref Range Status   Specimen Description   Final    TRACHEAL ASPIRATE Performed at [facility], [address] [place], [place] [address]    Special Requests   Final    NONE Performed at [facility], [address] [place], [place] [address]    Gram Stain   Final    MODERATE WBC PRESENT, PREDOMINANTLY PMN MODERATE GRAM POSITIVE COCCI IN PAIRS IN CHAINS FEW GRAM VARIABLE ROD    Culture   Final    FEW Consistent with normal respiratory flora. Performed at [facility], [place] [address] [place], [place] [address]    Report Status 06/12/2019 FINAL  Final  Radiology Studies: DG Chest Port 1 View  Result Date: 06/13/2019 CLINICAL DATA:  In stage renal disease EXAM: PORTABLE CHEST 1 VIEW COMPARISON:  06/11/2019 FINDINGS: Right dialysis catheter remains in place, unchanged. Heart is borderline in size. Airspace opacity in the left lower lung. Small bilateral effusions. Improved vascular congestion and perihilar opacities. IMPRESSION: Improving vascular congestion and perihilar opacities, possibly edema. Continued focal opacity in the left lower lobe with small left pleural effusion, unchanged. Electronically Signed   By: Rolm Baptise M.D.   On: 06/13/2019 19:06        Scheduled Meds: . aspirin  81 mg Oral Daily  . atorvastatin  20 mg Oral q1800  . brimonidine  1 drop Both Eyes BID  . budesonide  0.5 mg Nebulization BID  . carvedilol  12.5 mg Oral BID  . Chlorhexidine Gluconate Cloth  6 each Topical Daily  . dorzolamide-timolol  1 drop Both Eyes BID  . epoetin (EPOGEN/PROCRIT) injection  10,000 Units Subcutaneous Weekly  . famotidine  20 mg Oral QHS  . heparin injection (subcutaneous)  5,000 Units Subcutaneous Q8H  . insulin aspart  0-5 Units Subcutaneous QHS  . insulin aspart  0-6 Units Subcutaneous TID WC  . insulin aspart  3 Units Subcutaneous TID WC  . ipratropium-albuterol  3 mL Nebulization TID  . latanoprost  1 drop Left Eye QHS  . mouth rinse  15 mL Mouth Rinse BID  . olopatadine  1 drop Both Eyes BID  . senna-docusate  2 tablet Oral BID  . sertraline  25 mg Oral Daily  . sodium chloride flush  3 mL Intravenous Q12H  . torsemide  40 mg Oral Daily    Continuous Infusions: . sodium chloride Stopped (06/15/19 0256)     LOS: 10 days    Time spent: 35 minutes    Sidney Ace, MD [facility] Pager [phone]  If 7PM-7AM, please contact night-coverage 06/15/2019, 10:36 AM

## 2019-06-15 NOTE — Progress Notes (Signed)
Chart reviewed. Visited Pt just after she finished lunch today. Tray in front of her. Chopped meats remained on her tray, but she had eaten ALL other items. Pt reported the meats were too tough and she could not chew them so she avoided them. Pt stated she wanted the meats pureed "next time". Will alter diet so meats are pureed per Pt request and in hopes of increasing intake. Pt was appreciative of the visit.

## 2019-06-16 LAB — BASIC METABOLIC PANEL
Anion gap: 7 (ref 5–15)
BUN: 17 mg/dL (ref 8–23)
CO2: 31 mmol/L (ref 22–32)
Calcium: 9.3 mg/dL (ref 8.9–10.3)
Chloride: 102 mmol/L (ref 98–111)
Creatinine, Ser: 2.56 mg/dL — ABNORMAL HIGH (ref 0.44–1.00)
GFR calc Af Amer: 19 mL/min — ABNORMAL LOW (ref >60)
GFR calc non Af Amer: 16 mL/min — ABNORMAL LOW (ref >60)
Glucose, Bld: 87 mg/dL (ref 70–99)
Potassium: 3.6 mmol/L (ref 3.5–5.1)
Sodium: 140 mmol/L (ref 135–145)

## 2019-06-16 LAB — CBC WITH DIFFERENTIAL/PLATELET
Abs Immature Granulocytes: 0.04 10*3/uL (ref 0.00–0.07)
Basophils Absolute: 0 10*3/uL (ref 0.0–0.1)
Basophils Relative: 0 %
Eosinophils Absolute: 0.1 10*3/uL (ref 0.0–0.5)
Eosinophils Relative: 2 %
HCT: 23.4 % — ABNORMAL LOW (ref 36.0–46.0)
Hemoglobin: 7.2 g/dL — ABNORMAL LOW (ref 12.0–15.0)
Immature Granulocytes: 1 %
Lymphocytes Relative: 14 %
Lymphs Abs: 1 10*3/uL (ref 0.7–4.0)
MCH: 26.8 pg (ref 26.0–34.0)
MCHC: 30.8 g/dL (ref 30.0–36.0)
MCV: 87 fL (ref 80.0–100.0)
Monocytes Absolute: 0.7 10*3/uL (ref 0.1–1.0)
Monocytes Relative: 10 %
Neutro Abs: 4.9 10*3/uL (ref 1.7–7.7)
Neutrophils Relative %: 73 %
Platelets: 259 10*3/uL (ref 150–400)
RBC: 2.69 MIL/uL — ABNORMAL LOW (ref 3.87–5.11)
RDW: 14.5 % (ref 11.5–15.5)
WBC: 6.7 10*3/uL (ref 4.0–10.5)
nRBC: 0 % (ref 0.0–0.2)

## 2019-06-16 LAB — GLUCOSE, CAPILLARY
Glucose-Capillary: 78 mg/dL (ref 70–99)
Glucose-Capillary: 212 mg/dL — ABNORMAL HIGH (ref 70–99)
Glucose-Capillary: 160 mg/dL — ABNORMAL HIGH (ref 70–99)
Glucose-Capillary: 148 mg/dL — ABNORMAL HIGH (ref 70–99)

## 2019-06-16 MED ORDER — IRBESARTAN 150 MG PO TABS
75.0000 mg | ORAL_TABLET | Freq: Every day | ORAL | Status: DC
  Administered 2019-06-16 – 2019-06-17 (×2): 75 mg via ORAL
  Filled 2019-06-16 (×2): qty 1

## 2019-06-16 MED ORDER — AMLODIPINE BESYLATE 5 MG PO TABS
5.0000 mg | ORAL_TABLET | Freq: Every day | ORAL | Status: DC
  Administered 2019-06-16 – 2019-06-17 (×2): 5 mg via ORAL
  Filled 2019-06-16 (×2): qty 1

## 2019-06-16 NOTE — Progress Notes (Signed)
[name]  PROGRESS NOTE    Lori Stanley  [id number] DOB: Feb 14, 1932 DOA: 06/05/2019 PCP: Jerrol Banana., MD   Brief Narrative: HPI: Lori Stanley  is a 86 y.o. female with a known history of CHF, hypertension, dyslipidemia and coronary artery disease, presented to the emergency room with an onset of worsening dyspnea over the last few days with associated dry cough as well as worsening lower extremity edema, orthopnea and occasional paroxysmal nocturnal dyspnea.  No dysuria, oliguria or hematuria or flank pain.  No chest pain or palpitations.  No headache or dizziness or blurred vision.  Upon presentation to the emergency room, blood pressure was 166/65 with respirate of 28 and pulse symmetry was 81% on 2 L of O2 by nasal cannula when he was placed on BiPAP with pulse oximetry improvement 100%.  Labs revealed a BUN of 61 and creatinine 3.86 compared to 40 and 3.2 on 04/10/2019.  BNP was 577 and high-sensitivity troponin was 3.  COVID-19 PCR came back negative.  Chest x-ray showed early CHF.  Influenza antigens came back negative.  EKG showed normal sinus rhythm with rate of 67 with probable left atrial enlargement  The patient was given 40 mg of IV Lasix.  He will be admitted to telemetry bed for further evaluation and management.  Chronology per ICU: 06/08/19 - patient has been diuresing well, we did ultrasound asessment at bedside and pleural effusion is now small.  06/09/19 -patient has thickened airway secretions this am which were sent for micro, empiric abx broadened, attempting weaning SBT trial today.  Discussed care plan with niece Lori Stanley today.  06/10/19 -airway secretions are significantly improved. Will perform SBT today and liberate from MV 06/11/19- patient has clinically improved, for swallow eval today, develops atelectasis working on chest PT today, CXR today 06/12/19- participating with RT to use MetaNEB, optimizing medically for medsurg with telemetry. Diet advanced. HD  today.  2/10: Patient seen and examined.  Remains in stepdown status.  Visibly appears well.  Working with respiratory therapy.  Diet advanced.  Lasix GTT stopped per nephrology.  Anticipate medical readiness for downgrade to MedSurg status within 24 to 48 hours.  Discussed with ICU attending.  2/11: Patient seen and examined.  Improved energy levels.  In good spirits.  Optimized for transfer to general medical floor.  Discussed with ICU attending.  Transfer orders placed to MedSurg bed.  Respiratory status remains stable over interval.  Currently requiring 5 L high flow nasal cannula.  Receiving hemodialysis treatment today.  Ultrafiltration as tolerated.  2/12: Patient seen and examined.  Continues to improve.  Down to 3 L nasal cannula.  Hemodialysis per nephrology.  2/13: Patient seen and examined.  Continues to show clinical improvement.  In good spirits this morning.  Wants to get home.  Remains on 3 L nasal cannula.  Incentive spirometry not provided yesterday.  Instructed the bedside nurse to please get the patient an incentive spirometer and educated on use.  2/14: Patient seen and examined.  Clinically improved.  Down to 1 L nasal cannula.  Has been using incentive spirometry regularly.   Assessment & Plan:   Active Problems:   Acute on chronic diastolic CHF (congestive heart failure) (HCC)   Anemia of chronic disease   Essential hypertension   Type 2 diabetes mellitus with hyperlipidemia (HCC)   Acute on chronic heart failure with preserved ejection fraction (HFpEF) (HCC)   Heart failure (HCC)   Edema of both lower extremities   ESRD (end  stage renal disease) ([place])  Acute hypoxic respiratory failure.    Improving Patient was extubated 06/10/2019.   On high flow nasal cannula, titrated to 3 L nasal cannula On empiric Fortaz/doxy, stop date 06/15/2019 Lasix drip stopped per nephrology, IV Lasix also stopped On p.o. torsemide per nephrology Maintain strict ins and outs Continue  to titrate off oxygen as tolerated Stress incentive spirometry use  06/16/2019: Patient down to 1 L.  Discussed with nurse.  We will try to liberate from supplemental oxygen entirely prior to discharge.  End-stage renal disease on hemodialysis  Catheter placed in the right chest.   Lasix drip stopped after initiation of hemodialysis Anticipate patient will need long-term dialysis Nephrology on board, will look for outpatient HD chair Per case manager notes patient has a outpatient HD chair [facility].   Her last nephrology note patient is set up with outpatient hemodialysis and is cleared for discharge from their standpoint  06/16/2019: Patient has outpatient dialysis chair at [facility] unit.  Patient remained stable we will plan to dialyze in-house tomorrow and discharge home with home health afterwards  Acute on chronic diastolic congestive heart failure   Continue low-dose Coreg, dose decreased per cardiology to allow for more ultrafiltration during dialysis Once daily torsemide  Type 2 diabetes mellitus with hyperlipidemia  on Lipitor   Sliding scale insulin 3 units NovoLog 3 times daily with meals  Glaucoma  Ophthalmic gtt.  Essential hypertension Coreg as above  Depression Zoloft  Anemia of chronic disease Vitamin B12 deficiency Continue to monitor closely No indication to transfuse packed red cells at this moment IM B12 shots complete   DVT prophylaxis: Serial compression devices Code Status: Full code Family Communication: Voicemail left with niece Lori Stanley 06/14/19 Disposition Plan: Home with home health, pending liberation from supplemental oxygen  Consultants:   Cardiology  Nephrology  Critical care  Procedures:   Endotracheal intubation, now extubated  Temporary dialysis catheter placement  Antimicrobials:    Subjective: Seen and examined No acute status changes overnight On 3 L nasal cannula Mentating well, in good spirits   Objective: Vitals:   06/16/19 0412 06/16/19 0759 06/16/19 1204 06/16/19 1344  BP: (!) 172/70  (!) 152/74   Pulse: 81  90   Resp: 20     Temp: 99.1 F (37.3 C)  98.4 F (36.9 C)   TempSrc: Oral  Oral   SpO2: 94% 91% 98% 98%  Weight: 59.6 kg     Height:        Intake/Output Summary (Last 24 hours) at 06/16/2019 1428 Last data filed at 06/16/2019 0900 Gross per 24 hour  Intake 150 ml  Output 1300 ml  Net -1150 ml   Filed Weights   06/14/19 0500 06/15/19 0513 06/16/19 0412  Weight: 57.5 kg 59.2 kg 59.6 kg    Examination:  General exam: Appears calm and comfortable  Respiratory system: Scattered crackles bilaterally.  Normal work of breathing.  No audible wheeze Cardiovascular system: S1-S2, RRR, no murmurs  gastrointestinal system: Abdomen is nondistended, soft and nontender. No organomegaly or masses felt. Normal bowel sounds heard. Central nervous system: Alert and oriented. No focal neurological deficits. Extremities: Symmetric 5 x 5 power. Skin: No rashes, lesions or ulcers Psychiatry: Judgement and insight appear normal. Mood & affect appropriate.     Data Reviewed: I have personally reviewed following labs and imaging studies  CBC: Recent Labs  Lab 06/12/19 0435 06/13/19 0322 06/14/19 0833 06/15/19 0405 06/16/19 0445  WBC 9.2  6.8 7.1 7.1 6.7  NEUTROABS 7.2 4.7 5.1 5.2 4.9  HGB 7.7* 7.4* 8.0* 7.1* 7.2*  HCT 26.2* 25.1* 27.0* 23.6* 23.4*  MCV 88.5 88.7 87.9 87.1 87.0  PLT 249 214 253 216 [id number]   Basic Metabolic Panel: Recent Labs  Lab 06/10/19 0424 06/11/19 0434 06/12/19 0435 06/13/19 0322 06/14/19 0833 06/15/19 0405 06/16/19 0445  NA 137   < > 142 140 139 138 140  K 3.7   < > 3.3* 3.5 3.5 3.5 3.6  CL 104   < > 104 102 99 101 102  CO2 21*   < > 31 30 33* 31 31  GLUCOSE 114*   < > 129* 89 97 95 87  BUN 71*   < > 50* 43* 26* 11 17  CREATININE 4.00*   < > 3.00* 2.43* 2.20* 1.66* 2.56*  CALCIUM 8.9   < > 8.8* 8.6* 9.2 8.9 9.3  MG 2.2  --  2.1  --    --   --   --   PHOS 4.7*  --  4.0  --   --   --   --    < > = values in this interval not displayed.   GFR: Estimated Creatinine Clearance: 13.3 mL/min (A) (by C-G formula based on SCr of 2.56 mg/dL (H)). Liver Function Tests: No results for input(s): AST, ALT, ALKPHOS, BILITOT, PROT, ALBUMIN in the last 168 hours. No results for input(s): LIPASE, AMYLASE in the last 168 hours. No results for input(s): AMMONIA in the last 168 hours. Coagulation Profile: No results for input(s): INR, PROTIME in the last 168 hours. Cardiac Enzymes: No results for input(s): CKTOTAL, CKMB, CKMBINDEX, TROPONINI in the last 168 hours. BNP (last 3 results) No results for input(s): PROBNP in the last 8760 hours. HbA1C: No results for input(s): HGBA1C in the last 72 hours. CBG: Recent Labs  Lab 06/15/19 1157 06/15/19 1705 06/15/19 2108 06/16/19 0815 06/16/19 1202  GLUCAP 148* 101* 114* 78 212*   Lipid Profile: No results for input(s): CHOL, HDL, LDLCALC, TRIG, CHOLHDL, LDLDIRECT in the last 72 hours. Thyroid Function Tests: No results for input(s): TSH, T4TOTAL, FREET4, T3FREE, THYROIDAB in the last 72 hours. Anemia Panel: No results for input(s): VITAMINB12, FOLATE, FERRITIN, TIBC, IRON, RETICCTPCT in the last 72 hours. Sepsis Labs: Recent Labs  Lab 06/10/19 0424  PROCALCITON 6.80    Recent Results (from the past 240 hour(s))  MRSA PCR Screening     Status: None   Collection Time: 06/07/19 12:53 AM   Specimen: Nasopharyngeal  Result Value Ref Range Status   MRSA by PCR NEGATIVE NEGATIVE Final    Comment:        The GeneXpert MRSA Assay (FDA approved for NASAL specimens only), is one component of a comprehensive MRSA colonization surveillance program. It is not intended to diagnose MRSA infection nor to guide or monitor treatment for MRSA infections. Performed at [facility], [address] [place], [place] [address]   Culture, respiratory (non-expectorated)     Status:  None   Collection Time: 06/09/19 10:11 AM   Specimen: Tracheal Aspirate; Respiratory  Result Value Ref Range Status   Specimen Description   Final    TRACHEAL ASPIRATE Performed at [facility], [address], [place], [place] [address]    Special Requests   Final    NONE Performed at [facility], [address] [place], [place] [address]    Gram Stain   Final  MODERATE WBC PRESENT, PREDOMINANTLY PMN MODERATE GRAM POSITIVE COCCI IN PAIRS IN CHAINS FEW GRAM VARIABLE ROD    Culture   Final    FEW Consistent with normal respiratory flora. Performed at [facility], [place] [address] [place], [place] [address]    Report Status 06/12/2019 FINAL  Final         Radiology Studies: No results found.      Scheduled Meds: . aspirin  81 mg Oral Daily  . atorvastatin  20 mg Oral q1800  . brimonidine  1 drop Both Eyes BID  . budesonide  0.5 mg Nebulization BID  . carvedilol  12.5 mg Oral BID  . Chlorhexidine Gluconate Cloth  6 each Topical Daily  . dorzolamide-timolol  1 drop Both Eyes BID  . epoetin (EPOGEN/PROCRIT) injection  10,000 Units Subcutaneous Weekly  . famotidine  20 mg Oral QHS  . heparin injection (subcutaneous)  5,000 Units Subcutaneous Q8H  . insulin aspart  0-5 Units Subcutaneous QHS  . insulin aspart  0-6 Units Subcutaneous TID WC  . insulin aspart  3 Units Subcutaneous TID WC  . ipratropium-albuterol  3 mL Nebulization TID  . latanoprost  1 drop Left Eye QHS  . mouth rinse  15 mL Mouth Rinse BID  . olopatadine  1 drop Both Eyes BID  . senna-docusate  2 tablet Oral BID  . sertraline  25 mg Oral Daily  . sodium chloride flush  3 mL Intravenous Q12H  . torsemide  40 mg Oral Daily   Continuous Infusions: . sodium chloride Stopped (06/15/19 0256)     LOS: 11 days    Time spent: 35 minutes    Sidney Ace, MD [facility] Pager [phone]  If 7PM-7AM, please contact night-coverage 06/16/2019, 2:28 PM

## 2019-06-16 NOTE — Progress Notes (Signed)
[facility], [place] 06/16/19  Subjective:  Patient in good spirits today. Niece at bedside. Due for dialysis again tomorrow.   Objective:  Vital signs in last 24 hours:  Temp:  [98.4 F (36.9 C)-99.1 F (37.3 C)] 98.4 F (36.9 C) (02/14 1204) Pulse Rate:  [81-90] 90 (02/14 1204) Resp:  [18-20] 20 (02/14 0412) BP: (152-181)/(70-74) 152/74 (02/14 1204) SpO2:  [91 %-98 %] 98 % (02/14 1344) Weight:  [59.6 kg] 59.6 kg (02/14 0412)  Weight change: 0.4 kg Filed Weights   06/14/19 0500 06/15/19 0513 06/16/19 0412  Weight: 57.5 kg 59.2 kg 59.6 kg    Intake/Output:    Intake/Output Summary (Last 24 hours) at 06/16/2019 1605 Last data filed at 06/16/2019 0900 Gross per 24 hour  Intake 150 ml  Output 1300 ml  Net -1150 ml     Physical Exam: General:  no acute distress, laying in bed  HEENT  anicteric, moist oral membranes  Pulm/lungs  normal effort, clear to auscultation  CVS/Heart  regular, no rub  Abdomen:   Soft  Extremities:  Trace peripheral edema present  Neurologic:  Alert and able to follow simple commands  Skin:  Warm  Access:  Right IJ PermCath       Basic Metabolic Panel:  Recent Labs  Lab 06/10/19 0424 06/11/19 0434 06/12/19 0435 06/12/19 0435 06/13/19 0322 06/13/19 0322 06/14/19 0833 06/15/19 0405 06/16/19 0445  NA 137   < > 142  --  140  --  139 138 140  K 3.7   < > 3.3*  --  3.5  --  3.5 3.5 3.6  CL 104   < > 104  --  102  --  99 101 102  CO2 21*   < > 31  --  30  --  33* 31 31  GLUCOSE 114*   < > 129*  --  89  --  97 95 87  BUN 71*   < > 50*  --  43*  --  26* 11 17  CREATININE 4.00*   < > 3.00*  --  2.43*  --  2.20* 1.66* 2.56*  CALCIUM 8.9   < > 8.8*   < > 8.6*   < > 9.2 8.9 9.3  MG 2.2  --  2.1  --   --   --   --   --   --   PHOS 4.7*  --  4.0  --   --   --   --   --   --    < > = values in this interval not displayed.     CBC: Recent Labs  Lab 06/12/19 0435 06/13/19 0322 06/14/19 0833 06/15/19 0405  06/16/19 0445  WBC 9.2 6.8 7.1 7.1 6.7  NEUTROABS 7.2 4.7 5.1 5.2 4.9  HGB 7.7* 7.4* 8.0* 7.1* 7.2*  HCT 26.2* 25.1* 27.0* 23.6* 23.4*  MCV 88.5 88.7 87.9 87.1 87.0  PLT 249 214 253 216 259      Lab Results  Component Value Date   HEPBSAG NON REACTIVE 06/07/2019   HEPBSAB NON REACTIVE 06/07/2019   HEPBIGM NON REACTIVE 06/07/2019      Microbiology:  Recent Results (from the past 240 hour(s))  MRSA PCR Screening     Status: None   Collection Time: 06/07/19 12:53 AM   Specimen: Nasopharyngeal  Result Value Ref Range Status   MRSA by PCR NEGATIVE NEGATIVE Final    Comment:  The GeneXpert MRSA Assay (FDA approved for NASAL specimens only), is one component of a comprehensive MRSA colonization surveillance program. It is not intended to diagnose MRSA infection nor to guide or monitor treatment for MRSA infections. Performed at [facility], [address] [place], [place] [address]   Culture, respiratory (non-expectorated)     Status: None   Collection Time: 06/09/19 10:11 AM   Specimen: Tracheal Aspirate; Respiratory  Result Value Ref Range Status   Specimen Description   Final    TRACHEAL ASPIRATE Performed at [facility], [address] [place], [place] [address]    Special Requests   Final    NONE Performed at [facility], [address] [place], [place] [address]    Gram Stain   Final    MODERATE WBC PRESENT, PREDOMINANTLY PMN MODERATE GRAM POSITIVE COCCI IN PAIRS IN CHAINS FEW GRAM VARIABLE ROD    Culture   Final    FEW Consistent with normal respiratory flora. Performed at [facility], [place] [address] [place], [place] [address]    Report Status 06/12/2019 FINAL  Final    Coagulation Studies: No results for input(s): LABPROT, INR in the last 72 hours.  Urinalysis: No results for input(s): COLORURINE, LABSPEC, PHURINE, GLUCOSEU, HGBUR, BILIRUBINUR, KETONESUR, PROTEINUR, UROBILINOGEN, NITRITE,  LEUKOCYTESUR in the last 72 hours.  Invalid input(s): APPERANCEUR    Imaging: No results found.   Medications:   . sodium chloride Stopped (06/15/19 0256)   . amLODipine  5 mg Oral Daily  . aspirin  81 mg Oral Daily  . atorvastatin  20 mg Oral q1800  . brimonidine  1 drop Both Eyes BID  . budesonide  0.5 mg Nebulization BID  . carvedilol  12.5 mg Oral BID  . Chlorhexidine Gluconate Cloth  6 each Topical Daily  . dorzolamide-timolol  1 drop Both Eyes BID  . epoetin (EPOGEN/PROCRIT) injection  10,000 Units Subcutaneous Weekly  . famotidine  20 mg Oral QHS  . heparin injection (subcutaneous)  5,000 Units Subcutaneous Q8H  . insulin aspart  0-5 Units Subcutaneous QHS  . insulin aspart  0-6 Units Subcutaneous TID WC  . insulin aspart  3 Units Subcutaneous TID WC  . ipratropium-albuterol  3 mL Nebulization TID  . irbesartan  75 mg Oral Daily  . latanoprost  1 drop Left Eye QHS  . mouth rinse  15 mL Mouth Rinse BID  . olopatadine  1 drop Both Eyes BID  . senna-docusate  2 tablet Oral BID  . sertraline  25 mg Oral Daily  . sodium chloride flush  3 mL Intravenous Q12H  . torsemide  40 mg Oral Daily   sodium chloride, acetaminophen, ALPRAZolam, bisacodyl, hydrALAZINE, ondansetron (ZOFRAN) IV, sodium chloride flush, traZODone  Assessment/ Plan:  86 y.o.African [place] female with diabetes mellitus type II, hypertension, anemia, GERD, depression, anxiety, glaucoma, hyperlipidemia, diastolic congestive heart failure, coronary artery disease admitted on 06/05/2019 for SOB (shortness of breath) [R06.02] Acute CHF (congestive heart failure) ([place]) [I50.9] Acute respiratory failure with hypoxia (HCC) [J96.01] Heart failure (HCC) [I50.9] Acute on chronic congestive heart failure, unspecified heart failure type ([place]) [I50.9]   1.  End-stage renal disease Chronic kidney disease is likely secondary to diabetic nephropathy and hypertensive nephrosclerosis.  This admission, patient presented  via EMS from home for respiratory distress, shortness of breath, worsening lower extremity edema.  She has failed outpatient volume management. Dialysis catheter was placed this admission and started on chronic dialysis, which she is tolerating  well  Outpatient dialysis placement in progress. [facility]   -No acute indication for dialysis today.  We will plan for hemodialysis again tomorrow.  2.  Acute respiratory failure Hospital course complicated by respiratory arrest the night of February 4  Responded well to furosemide infusion,  Volume status appears to have optimized Extubated February 8 -Respiratory status stable.  3. Diabetes type 2 with CKD Lab Results  Component Value Date   HGBA1C 6.0 (H) 04/10/2019  Glycemic control as per hospitalist.  4. Anemia of CKD -  Lab Results  Component Value Date   HGB 7.2 (L) 06/16/2019  Continue Epogen 10,000 units weekly for now.  Transition to Epogen with each treatment as an outpatient.  5. SHPTH Lab Results  Component Value Date   PTH 148 (H) 06/07/2019   CALCIUM 9.3 06/16/2019   PHOS 4.0 06/12/2019  Recheck serum phosphorus with next dialysis treatment.     LOS: 11 Thomasenia Dowse 2/14/20214:05 PM  [place], [place]  Note: This note was prepared with Dragon dictation. Any transcription errors are unintentional

## 2019-06-17 LAB — GLUCOSE, CAPILLARY
Glucose-Capillary: 108 mg/dL — ABNORMAL HIGH (ref 70–99)
Glucose-Capillary: 96 mg/dL (ref 70–99)

## 2019-06-17 LAB — BASIC METABOLIC PANEL
Anion gap: 7 (ref 5–15)
BUN: 21 mg/dL (ref 8–23)
CO2: 30 mmol/L (ref 22–32)
Calcium: 9.4 mg/dL (ref 8.9–10.3)
Chloride: 102 mmol/L (ref 98–111)
Creatinine, Ser: 2.81 mg/dL — ABNORMAL HIGH (ref 0.44–1.00)
GFR calc Af Amer: 17 mL/min — ABNORMAL LOW (ref >60)
GFR calc non Af Amer: 15 mL/min — ABNORMAL LOW (ref >60)
Glucose, Bld: 119 mg/dL — ABNORMAL HIGH (ref 70–99)
Potassium: 3.7 mmol/L (ref 3.5–5.1)
Sodium: 139 mmol/L (ref 135–145)

## 2019-06-17 MED ORDER — TORSEMIDE 20 MG PO TABS: 40.0000 mg | ORAL_TABLET | Freq: Every day | ORAL | 0 refills | Status: DC

## 2019-06-17 NOTE — Discharge Summary (Signed)
Physician Discharge Summary  SHANEY DECKMAN DSK:[id number] DOB: 1932-04-28 DOA: 06/05/2019  PCP: Jerrol Banana., MD  Admit date: 06/05/2019 Discharge date: 06/17/2019  Admitted From: Home Disposition: Home with home health  Recommendations for Outpatient Follow-up:  1. Follow up with PCP in 1-2 weeks 2. Follow-up with nephrology as directed  Home Health: Yes Equipment/Devices: Oxygen, 2 L Discharge Condition: Stable CODE STATUS: Full Diet recommendation: Heart Healthy  Brief/Interim Summary:  HPI: Ella[place]lingtonis a86 y.o.femalewith a known history of CHF, hypertension, dyslipidemia and coronary artery disease, presented to the emergency room with an onset of worsening dyspnea over the last few days with associated dry cough as well as worsening lower extremity edema, orthopnea and occasional paroxysmal nocturnal dyspnea. No dysuria, oliguria or hematuria or flank pain. No chest pain or palpitations. No headache or dizziness or blurred vision.  Upon presentation to the emergency room, blood pressure was 166/65 with respirate of 28 and pulse symmetry was 81% on 2 L of O2 by nasal cannula when he was placed on BiPAP with pulse oximetry improvement 100%. Labs revealed a BUN of 61 and creatinine 3.86 compared to 40 and 3.2 on 04/10/2019. BNP was 577 and high-sensitivity troponin was 3. COVID-19 PCR came back negative. Chest x-ray showed early CHF. Influenza antigens came back negative. EKG showed normal sinus rhythm with rate of 67 with probable left atrial enlargement  The patient was given 40 mg of IV Lasix. He will be admitted to telemetry bed for further evaluation and management.  Chronology per ICU: 06/08/19- patient has been diuresing well, we did ultrasound asessment at bedside and pleural effusion is now small.  06/09/19-patient has thickened airway secretions this am which were sent for micro, empiric abx broadened, attempting weaning SBT trial today.  Discussed care plan with niece [name] today.  06/10/19 -airway secretions are significantly improved. Will perform SBT today and liberate from MV 06/11/19-patient has clinically improved, for swallow eval today, develops atelectasis working on chest PT today, CXR today 06/12/19- participating with RT to use MetaNEB, optimizing medically for medsurg with telemetry. Diet advanced. HD today.  2/10: Patient seen and examined.  Remains in stepdown status.  Visibly appears well.  Working with respiratory therapy.  Diet advanced.  Lasix GTT stopped per nephrology.  Anticipate medical readiness for downgrade to MedSurg status within 24 to 48 hours.  Discussed with ICU attending.  2/11: Patient seen and examined.  Improved energy levels.  In good spirits.  Optimized for transfer to general medical floor.  Discussed with ICU attending.  Transfer orders placed to MedSurg bed.  Respiratory status remains stable over interval.  Currently requiring 5 L high flow nasal cannula.  Receiving hemodialysis treatment today.  Ultrafiltration as tolerated.  2/12: Patient seen and examined.  Continues to improve.  Down to 3 L nasal cannula.  Hemodialysis per nephrology.  2/13: Patient seen and examined.  Continues to show clinical improvement.  In good spirits this morning.  Wants to get home.  Remains on 3 L nasal cannula.  Incentive spirometry not provided yesterday.  Instructed the bedside nurse to please get the patient an incentive spirometer and educated on use.  2/14: Patient seen and examined.  Clinically improved.  Down to 1 L nasal cannula.  Has been using incentive spirometry regularly.  2/15: Patient seen and examined.  Hemodialysis today.  Clinically much improved.  Titrated to room air however desaturates quickly down to the low 80s.  Will order 2 L home oxygen.  Home health ordered.  Patient medically stable for discharge home.  Discharge Diagnoses:  Active Problems:   Acute on chronic diastolic CHF  (congestive heart failure) (HCC)   Anemia of chronic disease   Essential hypertension   Type 2 diabetes mellitus with hyperlipidemia (HCC)   Acute on chronic heart failure with preserved ejection fraction (HFpEF) (HCC)   Heart failure (HCC)   Edema of both lower extremities   ESRD (end stage renal disease) ([place])  Acute hypoxic respiratory failure.   Improving Patient was extubated 06/10/2019.  On high flow nasal cannula, titrated to 3 L nasal cannula On empiric Fortaz/doxy, stop date 06/15/2019 Lasix drip stopped per nephrology, IV Lasix also stopped On p.o. torsemide per nephrology Discharge on 20 mg daily Follow-up outpatient nephrology Notified nephrology service of patient's discharge Patient will need 2 L home oxygen  End-stage renal disease on hemodialysis Catheter placed in the right chest.  Lasix drip stopped after initiation of hemodialysis Anticipate patient will need long-term dialysis Nephrology notified Patient has outpatient hemodialysis chair at [facility] facility  Acute on chronic diastolic congestive heart failure  Continue low-dose Coreg, dose decreased per cardiology to allow for more ultrafiltration during dialysis Once daily torsemide  Type 2 diabetes mellitus with hyperlipidemia  on Lipitor  Sliding scale insulin 3 units NovoLog 3 times daily with meals Resume home regimen on discharge  Glaucoma  Ophthalmic gtt.  Essential hypertension Coreg as above  Depression Zoloft  Anemia of chronic disease Vitamin B12 deficiency Continue to monitor closely No indication to transfuse packed red cells at this moment IM B12 shots complete  Discharge Instructions  Discharge Instructions    Diet - low sodium heart healthy   Complete by: As directed    Increase activity slowly   Complete by: As directed      Allergies as of 06/17/2019      Reactions   Antihistamines, Chlorpheniramine-type    Codeine    Paxil [paroxetine]    "makes her  feel funny" not in a good way      Medication List    STOP taking these medications   furosemide 40 MG tablet Commonly known as: LASIX   quinapril 40 MG tablet Commonly known as: ACCUPRIL     TAKE these medications   acetaminophen 500 MG tablet Commonly known as: TYLENOL Take 1,000 mg by mouth every 6 (six) hours as needed.   albuterol 108 (90 Base) MCG/ACT inhaler Commonly known as: VENTOLIN HFA Inhale 2 puffs into the lungs every 6 (six) hours as needed for wheezing or shortness of breath.   amLODipine 5 MG tablet Commonly known as: NORVASC Take 1 tablet (5 mg total) by mouth daily.   Aspirin Low Dose 81 MG EC tablet Generic drug: aspirin TAKE 1 TABLET (81 MG TOTAL) BY MOUTH DAILY. What changed: See the new instructions.   B-D UF III MINI PEN NEEDLES 31G X 5 MM Misc Generic drug: Insulin Pen Needle USE TWICE A DAY AS DIRECTED   Bepreve 1.5 % Soln Generic drug: Bepotastine Besilate Place 1 drop into both eyes daily.   brimonidine 0.2 % ophthalmic solution Commonly known as: ALPHAGAN 1 drop 2 (two) times daily. To affected eye(s)   calcitRIOL 0.25 MCG capsule Commonly known as: ROCALTROL Take 0.25 mcg by mouth daily.   calcitRIOL 0.25 MCG capsule Commonly known as: ROCALTROL Take 0.25 mcg by mouth every Monday, Wednesday, and Friday.   carvedilol 12.5 MG tablet Commonly known as: COREG TAKE 1 TABLET (12.5 MG TOTAL) BY MOUTH 2 (  TWO) TIMES DAILY.   dorzolamide-timolol 22.3-6.8 MG/ML ophthalmic solution Commonly known as: COSOPT Place 1 drop into both eyes 2 (two) times daily.   Doxepin HCl 3 MG Tabs Take 1 tablet (3 mg total) by mouth at bedtime.   Insulin Lispro Prot & Lispro (75-25) 100 UNIT/ML Kwikpen Commonly known as: HumaLOG Mix 75/25 KwikPen Inject 4 Units into the skin 2 (two) times daily. What changed: how much to take   latanoprost 0.005 % ophthalmic solution Commonly known as: XALATAN Place 1 drop into the left eye at bedtime.    loratadine 10 MG tablet Commonly known as: CLARITIN Take 1 tablet (10 mg total) by mouth daily.   ONE TOUCH ULTRA MINI w/Device Kit 1 kit by Does not apply route daily.   OneTouch Delica Lancets 96V Misc USE AS DIRECTED   OneTouch Ultra test strip Generic drug: glucose blood USE AS DIRECTED   sertraline 25 MG tablet Commonly known as: ZOLOFT TAKE 1 TABLET BY MOUTH EVERY DAY   telmisartan 20 MG tablet Commonly known as: MICARDIS TAKE 1 TABLET BY MOUTH EVERY DAY   torsemide 20 MG tablet Commonly known as: DEMADEX Take 2 tablets (40 mg total) by mouth daily.   Vitamin D (Ergocalciferol) 1.25 MG (50000 UNIT) Caps capsule Commonly known as: DRISDOL Take 50,000 Units by mouth every 7 (seven) days.            Durable Medical Equipment  (From admission, onward)         Start     Ordered   06/17/19 1519  For home use only DME oxygen  Once    Question Answer Comment  Length of Need 6 Months   Mode or (Route) Nasal cannula   Liters per Minute 2   Frequency Continuous (stationary and portable oxygen unit needed)   Oxygen conserving device Yes   Oxygen delivery system Gas      06/17/19 1519         Follow-up Information    [facility]. Go on 06/21/2019.   Specialty: Cardiology Why: at 9:30am. Enter through the [place] entrance Contact information: [place]         Allergies  Allergen Reactions  . Antihistamines, Chlorpheniramine-Type   . Codeine   . Paxil [Paroxetine]     "makes her feel funny" not in a good way    Consultations:  Critical care  Nephrology-[facility]   Procedures/Studies: DG Chest 1 View  Result Date: 06/06/2019 CLINICAL DATA:  86 year old female status post intubation. EXAM: CHEST  1 VIEW COMPARISON:  Chest radiograph dated 06/05/2019. FINDINGS: Endotracheal tube with tip approximately 2.5 cm above  the carina. There is vascular congestion and edema as well as left pleural effusion, similar or slightly worsened since the prior radiograph. Pneumonia is not excluded. Clinical correlation is recommended. No pneumothorax. Stable cardiac silhouette. Atherosclerotic calcification of the aorta. No acute osseous pathology. IMPRESSION: 1. Interval placement of an endotracheal tube with tip above the carina. 2. CHF and left effusion similar or slightly worsened. Pneumonia is not excluded. Clinical correlation is recommended. Electronically Signed   By: Anner Crete M.D.   On: 06/06/2019 20:25   DG Chest 1 View  Result Date: 06/05/2019 CLINICAL DATA:  Shortness of breath EXAM: CHEST  1 VIEW COMPARISON:  Film from earlier in the same day. FINDINGS: Cardiac shadow is stable. Aortic calcifications are again seen. Poor inspiratory effort is  seen. Persistent vascular congestion is noted with small left pleural effusion again consistent with mild CHF. IMPRESSION: The overall appearance of the chest is stable with changes mild CHF. New left pleural effusion is seen. Electronically Signed   By: Inez Catalina M.D.   On: 06/05/2019 20:13   DG Abd 1 View  Result Date: 06/06/2019 CLINICAL DATA:  OG tube placement EXAM: ABDOMEN - 1 VIEW COMPARISON:  None. FINDINGS: The OG tube projects over the gastric body. The bowel gas pattern is nonobstructive. Body wall edema is noted. IMPRESSION: OG tube projects over the stomach. Electronically Signed   By: Constance Holster M.D.   On: 06/06/2019 21:51   PERIPHERAL VASCULAR CATHETERIZATION  Result Date: 06/07/2019 See Op Note  DG Chest Port 1 View  Result Date: 06/13/2019 CLINICAL DATA:  In stage renal disease EXAM: PORTABLE CHEST 1 VIEW COMPARISON:  06/11/2019 FINDINGS: Right dialysis catheter remains in place, unchanged. Heart is borderline in size. Airspace opacity in the left lower lung. Small bilateral effusions. Improved vascular congestion and perihilar opacities.  IMPRESSION: Improving vascular congestion and perihilar opacities, possibly edema. Continued focal opacity in the left lower lobe with small left pleural effusion, unchanged. Electronically Signed   By: Rolm Baptise M.D.   On: 06/13/2019 19:06   DG Chest Port 1 View  Result Date: 06/11/2019 CLINICAL DATA:  Shortness of breath. Acute on chronic congestive heart failure. EXAM: PORTABLE CHEST 1 VIEW COMPARISON:  Chest x-rays dated 06/09/2019 and 06/08/2019 FINDINGS: Endotracheal tube and NG tube have been removed. Double lumen central venous catheter is in place, unchanged. Heart size is normal. The pulmonary vascularity has improved. Improved aeration at the right lung base. There is a persistent small left effusion and atelectasis at the left base with minimal residual atelectasis on the right. IMPRESSION: Improving pulmonary vascular congestion. Improved aeration at the right lung base. Aortic Atherosclerosis (ICD10-I70.0). Electronically Signed   By: Lorriane Shire M.D.   On: 06/11/2019 11:00   DG Chest Port 1 View  Result Date: 06/09/2019 CLINICAL DATA:  Pulmonary disease. EXAM: PORTABLE CHEST 1 VIEW COMPARISON:  June 07, 2019. FINDINGS: Stable cardiomediastinal silhouette. Endotracheal and nasogastric tubes are unchanged in position. Right internal jugular catheter is unchanged. No pneumothorax is noted. Mild central pulmonary vascular congestion is noted. Increased bibasilar atelectasis or edema is noted with small pleural effusions. Bony thorax is unremarkable. IMPRESSION: Stable support apparatus. Increased bibasilar atelectasis or edema is noted with small pleural effusions. Electronically Signed   By: Marijo Conception M.D.   On: 06/09/2019 10:47   DG Chest Port 1 View  Result Date: 06/08/2019 CLINICAL DATA:  Acute respiratory failure. EXAM: PORTABLE CHEST 1 VIEW COMPARISON:  June 06, 2019. FINDINGS: Stable cardiomediastinal silhouette. Endotracheal tube is unchanged in position. Interval  placement of nasogastric tube which is seen entering stomach. Interval placement of right internal jugular catheter with distal tip in expected position of cavoatrial junction. Mild central pulmonary vascular congestion is noted with possible bilateral pulmonary edema. Small left pleural effusion may be present. Bony thorax is unremarkable. IMPRESSION: Interval placement of nasogastric tube and right internal jugular catheter. Mild central pulmonary vascular congestion is noted with possible bilateral pulmonary edema. Electronically Signed   By: Marijo Conception M.D.   On: 06/08/2019 10:28   DG Chest Port 1 View  Result Date: 06/05/2019 CLINICAL DATA:  Respiratory distress EXAM: PORTABLE CHEST 1 VIEW COMPARISON:  04/18/2017 FINDINGS: Cardiac shadow is mildly prominent but accentuated by the portable technique. Vascular congestion  increased interstitial markings are noted. No sizable effusion is seen. No bony is noted. IMPRESSION: Changes consistent with early CHF. Electronically Signed   By: Inez Catalina M.D.   On: 06/05/2019 01:17   ECHOCARDIOGRAM COMPLETE  Result Date: 06/06/2019   ECHOCARDIOGRAM REPORT   Patient Name:   CHANCI OJALA Date of Exam: 06/05/2019 Medical Rec #:  [id number]         Height:       63.0 in Accession #:    [id number]        Weight:       137.0 lb Date of Birth:  [birth year]        BSA:          1.65 m Patient Age:    86 years          BP:           140/64 mmHg Patient Gender: F                 HR:           66 bpm. Exam Location:  [facility] Procedure: 2D Echo, Color Doppler and Cardiac Doppler Indications:     CHF-acute diastolic 967.89  History:         Patient has prior history of Echocardiogram examinations, most                  recent 04/11/2017. Risk Factors:Diabetes and Hypertension.                  Acute heart failure, MI.  Sonographer:     Sherrie Sport RDCS (AE) Referring Phys:  [id number] Arvil Chaco Diagnosing Phys: Ida Rogue MD IMPRESSIONS  1. Left ventricular ejection  fraction, by visual estimation, is 60 to 65%. The left ventricle has normal function. There is moderately increased left ventricular hypertrophy.  2. The left ventricle has no regional wall motion abnormalities.  3. Left ventricular diastolic parameters are consistent with Grade II diastolic dysfunction (pseudonormalization).  4. Global right ventricle has normal systolic function.The right ventricular size is normal. No increase in right ventricular wall thickness.  5. Left atrial size was severely dilated.  6. Mildly elevated pulmonary artery systolic pressure.  7. The inferior vena cava is dilated in size with <50% respiratory variability, suggesting right atrial pressure of 15 mmHg.  8. pleural effusion noted on left, 7 cm  9. Small pericardial effusion. FINDINGS  Left Ventricle: Left ventricular ejection fraction, by visual estimation, is 60 to 65%. The left ventricle has normal function. The left ventricle has no regional wall motion abnormalities. There is moderately increased left ventricular hypertrophy. Left ventricular diastolic parameters are consistent with Grade II diastolic dysfunction (pseudonormalization). Normal left atrial pressure. Right Ventricle: The right ventricular size is normal. No increase in right ventricular wall thickness. Global RV systolic function is has normal systolic function. The tricuspid regurgitant velocity is 2.25 m/s, and with an assumed right atrial pressure  of 15 mmHg, the estimated right ventricular systolic pressure is mildly elevated at 35.2 mmHg. Left Atrium: Left atrial size was severely dilated. Right Atrium: Right atrial size was normal in size Pericardium: A small pericardial effusion is present. Mitral Valve: The mitral valve is normal in structure. Mild mitral valve regurgitation. No evidence of mitral valve stenosis by observation. MV peak gradient, 13.7 mmHg. Tricuspid Valve: The tricuspid valve is normal in structure. Tricuspid valve regurgitation is mild.  Aortic Valve: The aortic valve was not well visualized. Aortic valve  regurgitation is not visualized. Mild to moderate aortic valve sclerosis/calcification is present, without any evidence of aortic stenosis. Aortic valve mean gradient measures 10.0 mmHg. Aortic valve peak gradient measures 18.1 mmHg. Aortic valve area, by VTI measures 1.93 cm. Pulmonic Valve: The pulmonic valve was normal in structure. Pulmonic valve regurgitation is not visualized. Pulmonic regurgitation is not visualized. Aorta: The aortic root, ascending aorta and aortic arch are all structurally normal, with no evidence of dilitation or obstruction. Venous: The inferior vena cava is dilated in size with less than 50% respiratory variability, suggesting right atrial pressure of 15 mmHg. IAS/Shunts: No atrial level shunt detected by color flow Doppler. There is no evidence of a patent foramen ovale. No ventricular septal defect is seen or detected. There is no evidence of an atrial septal defect.  LEFT VENTRICLE PLAX 2D LVIDd:         4.33 cm  Diastology LVIDs:         2.77 cm  LV e' medial:   3.81 cm/s LV PW:         1.19 cm  LV E/e' medial: 38.6 LV IVS:        1.20 cm LVOT diam:     2.00 cm LV SV:         56 ml LV SV Index:   33.30 LVOT Area:     3.14 cm  RIGHT VENTRICLE RV Basal diam:  4.09 cm RV S prime:     12.00 cm/s TAPSE (M-mode): 3.3 cm LEFT ATRIUM              Index       RIGHT ATRIUM           Index LA diam:        4.60 cm  2.79 cm/m  RA Area:     13.70 cm LA Vol (A2C):   139.0 ml 84.42 ml/m RA Volume:   37.30 ml  22.65 ml/m LA Vol (A4C):   66.6 ml  40.45 ml/m LA Biplane Vol: 104.0 ml 63.17 ml/m  AORTIC VALVE                    PULMONIC VALVE AV Area (Vmax):    1.70 cm     PV Vmax:        0.83 m/s AV Area (Vmean):   1.75 cm     PV Peak grad:   2.8 mmHg AV Area (VTI):     1.93 cm     RVOT Peak grad: 4 mmHg AV Vmax:           212.67 cm/s AV Vmean:          146.667 cm/s AV VTI:            0.530 m AV Peak Grad:      18.1 mmHg AV  Mean Grad:      10.0 mmHg LVOT Vmax:         115.00 cm/s LVOT Vmean:        81.700 cm/s LVOT VTI:          0.326 m LVOT/AV VTI ratio: 0.62  AORTA Ao Root diam: 2.60 cm MITRAL VALVE                         TRICUSPID VALVE MV Area (PHT): 2.21 cm              TR Peak grad:   20.2 mmHg MV Peak  grad:  13.7 mmHg             TR Vmax:        225.00 cm/s MV Mean grad:  5.0 mmHg MV Vmax:       1.85 m/s              SHUNTS MV Vmean:      92.6 cm/s             Systemic VTI:  0.33 m MV VTI:        0.54 m                Systemic Diam: 2.00 cm MV PHT:        99.47 msec MV Decel Time: 343 msec MV E velocity: 147.00 cm/s 103 cm/s MV A velocity: 105.00 cm/s 70.3 cm/s MV E/A ratio:  1.40        1.5  Ida Rogue MD Electronically signed by Ida Rogue MD Signature Date/Time: 06/06/2019/7:41:04 PM    Final     (Echo, Carotid, EGD, Colonoscopy, ERCP)    Subjective: Patient seen and examined on the day of discharge Hemodialysis today Medically stable for discharge 2 L home oxygen ordered  Discharge Exam: Vitals:   06/17/19 1512 06/17/19 1513  BP:    Pulse:    Resp:    Temp:    SpO2: (!) 89% 93%   Vitals:   06/17/19 1410 06/17/19 1511 06/17/19 1512 06/17/19 1513  BP: (!) 146/57     Pulse: 80     Resp: 18     Temp: 98.2 F (36.8 C)     TempSrc: Oral     SpO2: 93% (!) 82% (!) 89% 93%  Weight:      Height:        General: Pt is alert, awake, not in acute distress Cardiovascular: RRR, S1/S2 +, no rubs, no gallops Respiratory: CTA bilaterally, no wheezing, no rhonchi Abdominal: Soft, NT, ND, bowel sounds + Extremities: no edema, no cyanosis    The results of significant diagnostics from this hospitalization (including imaging, microbiology, ancillary and laboratory) are listed below for reference.     Microbiology: Recent Results (from the past 240 hour(s))  Culture, respiratory (non-expectorated)     Status: None   Collection Time: 06/09/19 10:11 AM   Specimen: Tracheal Aspirate;  Respiratory  Result Value Ref Range Status   Specimen Description   Final    TRACHEAL ASPIRATE Performed at [facility], [address], [place], [place] [address]    Special Requests   Final    NONE Performed at [facility], [address] [place], [place] [address]    Gram Stain   Final    MODERATE WBC PRESENT, PREDOMINANTLY PMN MODERATE GRAM POSITIVE COCCI IN PAIRS IN CHAINS FEW GRAM VARIABLE ROD    Culture   Final    FEW Consistent with normal respiratory flora. Performed at [facility], [place] [address], [place], [place] [address]    Report Status 06/12/2019 FINAL  Final     Labs: BNP (last 3 results) Recent Labs    06/05/19 0102 06/06/19 2023  BNP 577.0* 505.3*   Basic Metabolic Panel: Recent Labs  Lab 06/12/19 0435 06/12/19 0435 06/13/19 0322 06/14/19 0833 06/15/19 0405 06/16/19 0445 06/17/19 0505  NA 142   < > 140 139 138 140 139  K 3.3*   < > 3.5 3.5 3.5 3.6 3.7  CL 104   < > 102 99  101 102 102  CO2 31   < > 30 33* 31 31 30   GLUCOSE 129*   < > 89 97 95 87 119*  BUN 50*   < > 43* 26* 11 17 21   CREATININE 3.00*   < > 2.43* 2.20* 1.66* 2.56* 2.81*  CALCIUM 8.8*   < > 8.6* 9.2 8.9 9.3 9.4  MG 2.1  --   --   --   --   --   --   PHOS 4.0  --   --   --   --   --   --    < > = values in this interval not displayed.   Liver Function Tests: No results for input(s): AST, ALT, ALKPHOS, BILITOT, PROT, ALBUMIN in the last 168 hours. No results for input(s): LIPASE, AMYLASE in the last 168 hours. No results for input(s): AMMONIA in the last 168 hours. CBC: Recent Labs  Lab 06/12/19 0435 06/13/19 0322 06/14/19 0833 06/15/19 0405 06/16/19 0445  WBC 9.2 6.8 7.1 7.1 6.7  NEUTROABS 7.2 4.7 5.1 5.2 4.9  HGB 7.7* 7.4* 8.0* 7.1* 7.2*  HCT 26.2* 25.1* 27.0* 23.6* 23.4*  MCV 88.5 88.7 87.9 87.1 87.0  PLT 249 214 253 216 259   Cardiac Enzymes: No results for input(s): CKTOTAL, CKMB, CKMBINDEX, TROPONINI in the last 168  hours. BNP: Invalid input(s): POCBNP CBG: Recent Labs  Lab 06/16/19 1202 06/16/19 1644 06/16/19 2130 06/17/19 0740 06/17/19 1412  GLUCAP 212* 160* 148* 108* 96   D-Dimer No results for input(s): DDIMER in the last 72 hours. Hgb A1c No results for input(s): HGBA1C in the last 72 hours. Lipid Profile No results for input(s): CHOL, HDL, LDLCALC, TRIG, CHOLHDL, LDLDIRECT in the last 72 hours. Thyroid function studies No results for input(s): TSH, T4TOTAL, T3FREE, THYROIDAB in the last 72 hours.  Invalid input(s): FREET3 Anemia work up No results for input(s): VITAMINB12, FOLATE, FERRITIN, TIBC, IRON, RETICCTPCT in the last 72 hours. Urinalysis    Component Value Date/Time   COLORURINE Yellow 05/11/2013 2255   APPEARANCEUR Clear 05/11/2013 2255   LABSPEC 1.010 05/11/2013 2255   PHURINE 7.0 05/11/2013 2255   GLUCOSEU >=500 05/11/2013 2255   HGBUR 1+ 05/11/2013 2255   BILIRUBINUR Negative 05/11/2013 2255   KETONESUR Negative 05/11/2013 2255   PROTEINUR >=500 05/11/2013 2255   NITRITE Negative 05/11/2013 2255   LEUKOCYTESUR Negative 05/11/2013 2255   Sepsis Labs Invalid input(s): PROCALCITONIN,  WBC,  LACTICIDVEN Microbiology Recent Results (from the past 240 hour(s))  Culture, respiratory (non-expectorated)     Status: None   Collection Time: 06/09/19 10:11 AM   Specimen: Tracheal Aspirate; Respiratory  Result Value Ref Range Status   Specimen Description   Final    TRACHEAL ASPIRATE Performed at [facility], [address], [place], [place] [address]    Special Requests   Final    NONE Performed at [facility], [address] [place], [place] [address]    Gram Stain   Final    MODERATE WBC PRESENT, PREDOMINANTLY PMN MODERATE GRAM POSITIVE COCCI IN PAIRS IN CHAINS FEW GRAM VARIABLE ROD    Culture   Final    FEW Consistent with normal respiratory flora. Performed at [facility], [place] [address] [place], [place] [address]    Report  Status 06/12/2019 FINAL  Final     Time coordinating discharge: Over 30 minutes  SIGNED:   Sidney Ace, MD  [facility] 06/17/2019, 3:42 PM Pager  If 7PM-7AM, please contact night-coverage

## 2019-06-17 NOTE — Progress Notes (Signed)
This note also relates to the following rows which could not be included: Pulse Rate - Cannot attach notes to unvalidated device data Resp - Cannot attach notes to unvalidated device data BP - Cannot attach notes to unvalidated device data  Hd completed  

## 2019-06-17 NOTE — TOC Transition Note (Signed)
Transition of Care [facility]) - CM/SW Discharge Note   Patient Details  Name: Lori Stanley MRN: [id number] Date of Birth: Mar 13, 1932  [facility]) CM/SW Contact:  Beverly Sessions, RN Phone Number: 06/17/2019, 3:40 PM   Clinical Narrative:     Patient to discharge home today Patient qualifies for home o2. Brad with [employer] to deliver portable O2 prior to discharge  Niece at bedside.  States neighbor will be picking her up at discharge today  Corene Cornea with [facility] notified of discharge  Elvera Bicker to notify patient her dialysis schedule for discharge   Final next level of care: Home w Home Health Services Barriers to Discharge: No Barriers Identified   Patient Goals and CMS Choice Patient states their goals for this hospitalization and ongoing recovery are:: get back home to her family      Discharge Placement                       Discharge Plan and Services                DME Arranged: Oxygen DME Agency: [employer]     Representative spoke with at DME Agency: [facility] Arranged: RN, PT, OT, Nurse's Aide, Social Work [employer] Agency: [place] ([place]) Date [place]: 06/17/19   Representative spoke with at [place]: [place] (SDOH) Interventions     Readmission Risk Interventions No flowsheet data found.

## 2019-06-17 NOTE — Progress Notes (Signed)
SATURATION QUALIFICATIONS: (This note is used to comply with regulatory documentation for home oxygen)  Patient Saturations on Room Air at Rest = 93%  Patient Saturations on Room Air while Ambulating = 82%  Patient Saturations on 2 Liters of oxygen while Ambulating = 89%  Please briefly explain why patient needs home oxygen: When she sat back down in the chair O2 came back up to 93% within one minute

## 2019-06-17 NOTE — Progress Notes (Signed)
OT Cancellation Note  Patient Details Name: Lori Stanley MRN: [id number] DOB: 1932-03-05   Cancelled Treatment:    Reason Eval/Treat Not Completed: Patient at procedure or test/ unavailable. Pt receiving dialysis. Will re-attempt OT tx at later date/time as pt is available and medically appropriate.   Jeni Salles, MPH, MS, OTR/L ascom [phone] 06/17/19, 10:25 AM

## 2019-06-17 NOTE — Progress Notes (Signed)
PT Cancellation Note  Patient Details Name: Lori Stanley MRN: [id number] DOB: 09-Aug-1931   Cancelled Treatment:    Reason Eval/Treat Not Completed: Patient at procedure or test/unavailable   Attempted session prior to dialysis but she was eating then transport had just arrived to take her.  Will continue as appropriate.   Chesley Noon 06/17/2019, 10:04 AM

## 2019-06-17 NOTE — Care Management Important Message (Signed)
Important Message  Patient Details  Name: Lori Stanley MRN: [id number] Date of Birth: Jun 23, 1931   Medicare Important Message Given:  Yes     Dannette Barbara 06/17/2019, 11:52 AM

## 2019-06-17 NOTE — Progress Notes (Signed)
MD ordered patient to be discharged home.  Discharge instructions were reviewed with the patient and she voiced understanding.  Follow-up appointment was made.  Prescriptions sent to the patients pharmacy.  IV was removed with catheter intact.  All patients questions were answered.  Patient discharged in wheelchair escorted by the RN

## 2019-06-17 NOTE — Progress Notes (Signed)
[place], [place] 06/17/19  Subjective:  Patient seen and evaluated during dialysis treatment. Tolerating well. Blood flow rate 400. Ultrafiltration target 1.5 kg.   Objective:  Vital signs in last 24 hours:  Temp:  [98.2 F (36.8 C)-98.6 F (37 C)] 98.6 F (37 C) (02/15 0940) Pulse Rate:  [78-93] 78 (02/15 1030) Resp:  [18-25] 21 (02/15 1030) BP: (139-175)/(61-114) 139/114 (02/15 1030) SpO2:  [91 %-98 %] 93 % (02/15 0804) FiO2 (%):  [18 %] 18 % (02/14 2029) Weight:  [59.1 kg] 59.1 kg (02/15 0439)  Weight change: -0.5 kg Filed Weights   06/15/19 0513 06/16/19 0412 06/17/19 0439  Weight: 59.2 kg 59.6 kg 59.1 kg    Intake/Output:    Intake/Output Summary (Last 24 hours) at 06/17/2019 1051 Last data filed at 06/17/2019 0918 Gross per 24 hour  Intake 0 ml  Output 1450 ml  Net -1450 ml     Physical Exam: General:  no acute distress, laying in bed  HEENT  anicteric, moist oral membranes  Pulm/lungs  normal effort, clear to auscultation  CVS/Heart  regular, no rub  Abdomen:   Soft  Extremities:  No lower extremity edema  Neurologic:  Alert and able to follow simple commands  Skin:  Warm  Access:  Right IJ PermCath       Basic Metabolic Panel:  Recent Labs  Lab 06/12/19 0435 06/12/19 0435 06/13/19 0322 06/13/19 0322 06/14/19 0833 06/14/19 0833 06/15/19 0405 06/16/19 0445 06/17/19 0505  NA 142   < > 140  --  139  --  138 140 139  K 3.3*   < > 3.5  --  3.5  --  3.5 3.6 3.7  CL 104   < > 102  --  99  --  101 102 102  CO2 31   < > 30  --  33*  --  31 31 30   GLUCOSE 129*   < > 89  --  97  --  95 87 119*  BUN 50*   < > 43*  --  26*  --  11 17 21   CREATININE 3.00*   < > 2.43*  --  2.20*  --  1.66* 2.56* 2.81*  CALCIUM 8.8*   < > 8.6*   < > 9.2   < > 8.9 9.3 9.4  MG 2.1  --   --   --   --   --   --   --   --   PHOS 4.0  --   --   --   --   --   --   --   --    < > = values in this interval not displayed.     CBC: Recent  Labs  Lab 06/12/19 0435 06/13/19 0322 06/14/19 0833 06/15/19 0405 06/16/19 0445  WBC 9.2 6.8 7.1 7.1 6.7  NEUTROABS 7.2 4.7 5.1 5.2 4.9  HGB 7.7* 7.4* 8.0* 7.1* 7.2*  HCT 26.2* 25.1* 27.0* 23.6* 23.4*  MCV 88.5 88.7 87.9 87.1 87.0  PLT 249 214 253 216 259      Lab Results  Component Value Date   HEPBSAG NON REACTIVE 06/07/2019   HEPBSAB NON REACTIVE 06/07/2019   HEPBIGM NON REACTIVE 06/07/2019      Microbiology:  Recent Results (from the past 240 hour(s))  Culture, respiratory (non-expectorated)     Status: None   Collection Time: 06/09/19 10:11 AM   Specimen: Tracheal Aspirate; Respiratory  Result Value Ref Range  Status   Specimen Description   Final    TRACHEAL ASPIRATE Performed at [facility], [address], [place], [place] [address]    Special Requests   Final    NONE Performed at [facility], [address] [place], [place] [address]    Gram Stain   Final    MODERATE WBC PRESENT, PREDOMINANTLY PMN MODERATE GRAM POSITIVE COCCI IN PAIRS IN CHAINS FEW GRAM VARIABLE ROD    Culture   Final    FEW Consistent with normal respiratory flora. Performed at [facility], [place] [address], [place], [place] [address]    Report Status 06/12/2019 FINAL  Final    Coagulation Studies: No results for input(s): LABPROT, INR in the last 72 hours.  Urinalysis: No results for input(s): COLORURINE, LABSPEC, PHURINE, GLUCOSEU, HGBUR, BILIRUBINUR, KETONESUR, PROTEINUR, UROBILINOGEN, NITRITE, LEUKOCYTESUR in the last 72 hours.  Invalid input(s): APPERANCEUR    Imaging: No results found.   Medications:   . sodium chloride Stopped (06/15/19 0256)   . amLODipine  5 mg Oral Daily  . aspirin  81 mg Oral Daily  . atorvastatin  20 mg Oral q1800  . brimonidine  1 drop Both Eyes BID  . budesonide  0.5 mg Nebulization BID  . carvedilol  12.5 mg Oral BID  . Chlorhexidine Gluconate Cloth  6 each Topical Daily  . dorzolamide-timolol  1 drop Both  Eyes BID  . epoetin (EPOGEN/PROCRIT) injection  10,000 Units Subcutaneous Weekly  . famotidine  20 mg Oral QHS  . heparin injection (subcutaneous)  5,000 Units Subcutaneous Q8H  . insulin aspart  0-5 Units Subcutaneous QHS  . insulin aspart  0-6 Units Subcutaneous TID WC  . insulin aspart  3 Units Subcutaneous TID WC  . ipratropium-albuterol  3 mL Nebulization TID  . irbesartan  75 mg Oral Daily  . latanoprost  1 drop Left Eye QHS  . mouth rinse  15 mL Mouth Rinse BID  . olopatadine  1 drop Both Eyes BID  . senna-docusate  2 tablet Oral BID  . sertraline  25 mg Oral Daily  . sodium chloride flush  3 mL Intravenous Q12H  . torsemide  40 mg Oral Daily   sodium chloride, acetaminophen, ALPRAZolam, bisacodyl, hydrALAZINE, ondansetron (ZOFRAN) IV, sodium chloride flush, traZODone  Assessment/ Plan:  86 y.o.African [place] female with diabetes mellitus type II, hypertension, anemia, GERD, depression, anxiety, glaucoma, hyperlipidemia, diastolic congestive heart failure, coronary artery disease admitted on 06/05/2019 for SOB (shortness of breath) [R06.02] Acute CHF (congestive heart failure) ([place]) [I50.9] Acute respiratory failure with hypoxia (HCC) [J96.01] Heart failure (HCC) [I50.9] Acute on chronic congestive heart failure, unspecified heart failure type ([place]) [I50.9]   1.  End-stage renal disease Chronic kidney disease is likely secondary to diabetic nephropathy and hypertensive nephrosclerosis.  This admission, patient presented via EMS from home for respiratory distress, shortness of breath, worsening lower extremity edema.  She has failed outpatient volume management. Dialysis catheter was placed this admission and started on chronic dialysis, which she is tolerating well  Outpatient dialysis placement in progress. [facility]   -Patient seen and evaluated during dialysis treatment.  Tolerating well.  Blood flow rate 400 with ultrafiltration target of 1.5 kg.  2.  Acute  respiratory failure Hospital course complicated by respiratory arrest the night of February 4  Responded well to furosemide infusion,  Volume status appears to have optimized Extubated February 8 -Breathing comfortably.  Currently on nasal cannula.  3. Diabetes type 2  with CKD Lab Results  Component Value Date   HGBA1C 6.0 (H) 04/10/2019  Glycemic control as per hospitalist.  4. Anemia of CKD -  Lab Results  Component Value Date   HGB 7.2 (L) 06/16/2019  Patient will be started on Epogen with every dialysis treatment as an outpatient.  5. SHPTH Lab Results  Component Value Date   PTH 148 (H) 06/07/2019   CALCIUM 9.4 06/17/2019   PHOS 4.0 06/12/2019  Awaiting repeat phosphorus today.     LOS: 12 Kayliee Atienza 2/15/202110:51 AM  [place], [place]  Note: This note was prepared with Dragon dictation. Any transcription errors are unintentional

## 2019-06-18 ENCOUNTER — Telehealth: Payer: Self-pay | Admitting: Family Medicine

## 2019-06-18 DIAGNOSIS — E1122 Type 2 diabetes mellitus with diabetic chronic kidney disease: Secondary | ICD-10-CM | POA: Diagnosis not present

## 2019-06-18 DIAGNOSIS — D631 Anemia in chronic kidney disease: Secondary | ICD-10-CM | POA: Diagnosis not present

## 2019-06-18 DIAGNOSIS — M545 Low back pain: Secondary | ICD-10-CM | POA: Diagnosis not present

## 2019-06-18 DIAGNOSIS — I252 Old myocardial infarction: Secondary | ICD-10-CM | POA: Diagnosis not present

## 2019-06-18 DIAGNOSIS — N186 End stage renal disease: Secondary | ICD-10-CM | POA: Diagnosis not present

## 2019-06-18 DIAGNOSIS — I132 Hypertensive heart and chronic kidney disease with heart failure and with stage 5 chronic kidney disease, or end stage renal disease: Secondary | ICD-10-CM | POA: Diagnosis not present

## 2019-06-18 DIAGNOSIS — I2510 Atherosclerotic heart disease of native coronary artery without angina pectoris: Secondary | ICD-10-CM | POA: Diagnosis not present

## 2019-06-18 DIAGNOSIS — G8929 Other chronic pain: Secondary | ICD-10-CM | POA: Diagnosis not present

## 2019-06-18 DIAGNOSIS — J9621 Acute and chronic respiratory failure with hypoxia: Secondary | ICD-10-CM | POA: Diagnosis not present

## 2019-06-18 DIAGNOSIS — I051 Rheumatic mitral insufficiency: Secondary | ICD-10-CM | POA: Diagnosis not present

## 2019-06-18 DIAGNOSIS — I0981 Rheumatic heart failure: Secondary | ICD-10-CM | POA: Diagnosis not present

## 2019-06-18 DIAGNOSIS — N179 Acute kidney failure, unspecified: Secondary | ICD-10-CM | POA: Diagnosis not present

## 2019-06-18 DIAGNOSIS — I5022 Chronic systolic (congestive) heart failure: Secondary | ICD-10-CM | POA: Diagnosis not present

## 2019-06-18 DIAGNOSIS — I5033 Acute on chronic diastolic (congestive) heart failure: Secondary | ICD-10-CM | POA: Diagnosis not present

## 2019-06-18 MED ORDER — TORSEMIDE 20 MG PO TABS: 40.0000 mg | ORAL_TABLET | Freq: Every day | ORAL | 3 refills | Status: DC

## 2019-06-18 NOTE — Telephone Encounter (Signed)
Okay to refill for 3 months 

## 2019-06-18 NOTE — Telephone Encounter (Signed)
Mardene Celeste advised of approved verbal orders.

## 2019-06-18 NOTE — Telephone Encounter (Signed)
Lori Stanley calling with [facility] called and would like verbals for Nursing   1x3   No home health aid needed.   Lori Stanley would also like an Rx for medication demadex 2 times a day. This medication was given by the hospital.   [id number] [place], [place] - [address] Phone:  [phone]  Fax:  [phone]

## 2019-06-19 ENCOUNTER — Inpatient Hospital Stay: Payer: Medicare Other | Admitting: Family Medicine

## 2019-06-19 NOTE — Progress Notes (Deleted)
Patient: Lori Stanley Female    DOB: [birth year]   86 y.o.   MRN: [id number] Visit Date: 06/19/2019  Today's Provider: [name], MD   No chief complaint on file.  Subjective:     HPI    Follow up Hospitalization  Patient was admitted to [facility] on 06/05/2019 and discharged on 06/17/2019. She was treated for Cardiac arrest/unresponsive . Treatment for this included; [place], CXR, Labs. Lasix 20 mg, Coreg 12.5 mg. Telephone follow up was done on n/a She reports {DESC; EXCELLENT/GOOD/FAIR:19992} compliance with treatment. She reports this condition is {improved/worse/unchanged:3041574}.  ------------------------------------------------------------------------------------     Allergies  Allergen Reactions  . Antihistamines, Chlorpheniramine-Type   . Codeine   . Paxil [Paroxetine]     "makes her feel funny" not in a good way     Current Outpatient Medications:  .  acetaminophen (TYLENOL) 500 MG tablet, Take 1,000 mg by mouth every 6 (six) hours as needed., Disp: , Rfl:  .  albuterol (PROVENTIL HFA;VENTOLIN HFA) 108 (90 Base) MCG/ACT inhaler, Inhale 2 puffs into the lungs every 6 (six) hours as needed for wheezing or shortness of breath., Disp: 1 Inhaler, Rfl: 0 .  amLODipine (NORVASC) 5 MG tablet, Take 1 tablet (5 mg total) by mouth daily., Disp: 90 tablet, Rfl: 3 .  ASPIRIN LOW DOSE 81 MG EC tablet, TAKE 1 TABLET (81 MG TOTAL) BY MOUTH DAILY. (Patient taking differently: Take 81 mg by mouth daily. ), Disp: 90 tablet, Rfl: 1 .  Bepotastine Besilate (BEPREVE) 1.5 % SOLN, Place 1 drop into both eyes daily., Disp: , Rfl:  .  Blood Glucose Monitoring Suppl (ONE TOUCH ULTRA MINI) w/Device KIT, 1 kit by Does not apply route daily., Disp: 1 kit, Rfl: 0 .  brimonidine (ALPHAGAN) 0.2 % ophthalmic solution, 1 drop 2 (two) times daily. To affected eye(s), Disp: , Rfl:  .  calcitRIOL (ROCALTROL) 0.25 MCG capsule, Take 0.25 mcg by mouth every Monday, Wednesday, and Friday. ,  Disp: , Rfl:  .  calcitRIOL (ROCALTROL) 0.25 MCG capsule, Take 0.25 mcg by mouth daily., Disp: , Rfl:  .  carvedilol (COREG) 12.5 MG tablet, TAKE 1 TABLET (12.5 MG TOTAL) BY MOUTH 2 (TWO) TIMES DAILY., Disp: 180 tablet, Rfl: 1 .  dorzolamide-timolol (COSOPT) 22.3-6.8 MG/ML ophthalmic solution, Place 1 drop into both eyes 2 (two) times daily. , Disp: , Rfl:  .  Doxepin HCl 3 MG TABS, Take 1 tablet (3 mg total) by mouth at bedtime., Disp: 30 tablet, Rfl: 0 .  Insulin Lispro Prot & Lispro (HUMALOG MIX 75/25 KWIKPEN) (75-25) 100 UNIT/ML Kwikpen, Inject 4 Units into the skin 2 (two) times daily. (Patient taking differently: Inject 2 Units into the skin 2 (two) times daily. ), Disp: 16 pen, Rfl: 12 .  Insulin Pen Needle (B-D UF III MINI PEN NEEDLES) 31G X 5 MM MISC, USE TWICE A DAY AS DIRECTED, Disp: 100 each, Rfl: 5 .  latanoprost (XALATAN) 0.005 % ophthalmic solution, Place 1 drop into the left eye at bedtime. , Disp: , Rfl:  .  loratadine (CLARITIN) 10 MG tablet, Take 1 tablet (10 mg total) by mouth daily. (Patient not taking: Reported on 06/05/2019), Disp: 90 tablet, Rfl: 3 .  OneTouch Delica Lancets 02I MISC, USE AS DIRECTED, Disp: 100 each, Rfl: 1 .  ONETOUCH ULTRA test strip, USE AS DIRECTED, Disp: 100 strip, Rfl: 5 .  sertraline (ZOLOFT) 25 MG tablet, TAKE 1 TABLET BY MOUTH EVERY DAY, Disp: 90 tablet, Rfl:  2 .  telmisartan (MICARDIS) 20 MG tablet, TAKE 1 TABLET BY MOUTH EVERY DAY, Disp: 90 tablet, Rfl: 3 .  torsemide (DEMADEX) 20 MG tablet, Take 2 tablets (40 mg total) by mouth daily., Disp: 60 tablet, Rfl: 3 .  Vitamin D, Ergocalciferol, (DRISDOL) 50000 units CAPS capsule, Take 50,000 Units by mouth every 7 (seven) days., Disp: , Rfl:   Review of Systems  Constitutional: Negative for appetite change, chills, fatigue and fever.  Respiratory: Negative for chest tightness and shortness of breath.   Cardiovascular: Negative for chest pain and palpitations.  Gastrointestinal: Negative for abdominal  pain, nausea and vomiting.  Neurological: Negative for dizziness and weakness.    Social History   Tobacco Use  . Smoking status: Former Smoker    Types: Cigarettes  . Smokeless tobacco: Never Used  . Tobacco comment: Quit in 2015-2016  Substance Use Topics  . Alcohol use: No      Objective:   There were no vitals taken for this visit. There were no vitals filed for this visit.There is no height or weight on file to calculate BMI.   Physical Exam   No results found for any visits on 06/19/19.     Assessment & Plan        [name], MD  [facility]

## 2019-06-20 ENCOUNTER — Telehealth: Payer: Self-pay

## 2019-06-20 DIAGNOSIS — J9621 Acute and chronic respiratory failure with hypoxia: Secondary | ICD-10-CM | POA: Diagnosis not present

## 2019-06-20 DIAGNOSIS — I132 Hypertensive heart and chronic kidney disease with heart failure and with stage 5 chronic kidney disease, or end stage renal disease: Secondary | ICD-10-CM | POA: Diagnosis not present

## 2019-06-20 DIAGNOSIS — D631 Anemia in chronic kidney disease: Secondary | ICD-10-CM | POA: Diagnosis not present

## 2019-06-20 DIAGNOSIS — G8929 Other chronic pain: Secondary | ICD-10-CM | POA: Diagnosis not present

## 2019-06-20 DIAGNOSIS — I252 Old myocardial infarction: Secondary | ICD-10-CM | POA: Diagnosis not present

## 2019-06-20 DIAGNOSIS — I051 Rheumatic mitral insufficiency: Secondary | ICD-10-CM | POA: Diagnosis not present

## 2019-06-20 DIAGNOSIS — I2510 Atherosclerotic heart disease of native coronary artery without angina pectoris: Secondary | ICD-10-CM | POA: Diagnosis not present

## 2019-06-20 DIAGNOSIS — M545 Low back pain: Secondary | ICD-10-CM | POA: Diagnosis not present

## 2019-06-20 DIAGNOSIS — E1122 Type 2 diabetes mellitus with diabetic chronic kidney disease: Secondary | ICD-10-CM | POA: Diagnosis not present

## 2019-06-20 DIAGNOSIS — N179 Acute kidney failure, unspecified: Secondary | ICD-10-CM | POA: Diagnosis not present

## 2019-06-20 DIAGNOSIS — I5033 Acute on chronic diastolic (congestive) heart failure: Secondary | ICD-10-CM | POA: Diagnosis not present

## 2019-06-20 DIAGNOSIS — N186 End stage renal disease: Secondary | ICD-10-CM | POA: Diagnosis not present

## 2019-06-20 DIAGNOSIS — I0981 Rheumatic heart failure: Secondary | ICD-10-CM | POA: Diagnosis not present

## 2019-06-20 NOTE — Progress Notes (Deleted)
   Patient ID: Lori Stanley, female    DOB: 07-25-1931, 86 y.o.   MRN: [id number]  HPI  [name] is a 86 y/o female with a history of  Echo report from 06/05/19 reviewed and showed an EF of 60-65% along with mildly elevated PA pressure and a 7cm left pleural effusion.   Admitted 06/05/19 due to acute on chronic HF. Cardiology and vascular consults obtained. Initially was intubated and had IV lasix and lasix gtt. Transitioned to oral diuretics. Needed dialysis. Given empiric antibiotics. Discharged after 12 days with oxygen.  She presents today for her initial visit with a chief complaint of   Review of Systems    Physical Exam    Assessment & Plan:  1: Chronic heart failure with preserved ejection fraction- - NYHA class - BNP 06/06/19 was 549.0  2: HTN- - BP - saw PCP ([place]) 05/31/19 - BMP from 06/17/19 reviewed and showed sodium 139, potassium 3.7, creatinine 2.81 and GFR 17  3: DM- - A1c 04/10/2019 was 6.0%  4: CKD- - dialysis - saw nephrology ([place]) 04/30/2019

## 2019-06-20 NOTE — Telephone Encounter (Signed)
Copied from [place] [id number]. Topic: General - Other >> Jun 20, 2019  3:15 PM Rainey Pines A wrote: Erline Levine PT  with [facility] .called for Verbal orders for PT 1w1 2w2 1w1. Best contact [phone] and you can leave vm

## 2019-06-21 ENCOUNTER — Ambulatory Visit: Payer: Medicare Other | Admitting: Family

## 2019-06-21 ENCOUNTER — Telehealth: Payer: Self-pay | Admitting: Family Medicine

## 2019-06-21 DIAGNOSIS — Z992 Dependence on renal dialysis: Secondary | ICD-10-CM | POA: Diagnosis not present

## 2019-06-21 DIAGNOSIS — N186 End stage renal disease: Secondary | ICD-10-CM | POA: Diagnosis not present

## 2019-06-21 NOTE — Telephone Encounter (Signed)
Esther OT with [facility] is calling and needs verbal orders for this patient to have OT  1x1, 2x2  Ok to leave verbal ok on vm

## 2019-06-21 NOTE — Telephone Encounter (Signed)
Left detailed message on Stacey's vm, giving verbal okay for PT.

## 2019-06-21 NOTE — Telephone Encounter (Signed)
thanks

## 2019-06-21 NOTE — Telephone Encounter (Signed)
Returned call to [employer] and gave verbal okay for OT.

## 2019-06-24 DIAGNOSIS — Z992 Dependence on renal dialysis: Secondary | ICD-10-CM | POA: Diagnosis not present

## 2019-06-24 DIAGNOSIS — N186 End stage renal disease: Secondary | ICD-10-CM | POA: Diagnosis not present

## 2019-06-25 ENCOUNTER — Encounter: Payer: Self-pay | Admitting: Family Medicine

## 2019-06-25 ENCOUNTER — Other Ambulatory Visit: Payer: Self-pay

## 2019-06-25 ENCOUNTER — Ambulatory Visit (INDEPENDENT_AMBULATORY_CARE_PROVIDER_SITE_OTHER): Payer: Medicare Other | Admitting: Family Medicine

## 2019-06-25 VITALS — BP 115/54 | HR 72 | Temp 97.3°F | Resp 18 | Ht 63.0 in | Wt 130.0 lb

## 2019-06-25 DIAGNOSIS — N2581 Secondary hyperparathyroidism of renal origin: Secondary | ICD-10-CM

## 2019-06-25 DIAGNOSIS — E1169 Type 2 diabetes mellitus with other specified complication: Secondary | ICD-10-CM

## 2019-06-25 DIAGNOSIS — J9601 Acute respiratory failure with hypoxia: Secondary | ICD-10-CM | POA: Diagnosis not present

## 2019-06-25 DIAGNOSIS — I2510 Atherosclerotic heart disease of native coronary artery without angina pectoris: Secondary | ICD-10-CM | POA: Diagnosis not present

## 2019-06-25 DIAGNOSIS — J9621 Acute and chronic respiratory failure with hypoxia: Secondary | ICD-10-CM | POA: Diagnosis not present

## 2019-06-25 DIAGNOSIS — N179 Acute kidney failure, unspecified: Secondary | ICD-10-CM | POA: Diagnosis not present

## 2019-06-25 DIAGNOSIS — I132 Hypertensive heart and chronic kidney disease with heart failure and with stage 5 chronic kidney disease, or end stage renal disease: Secondary | ICD-10-CM | POA: Diagnosis not present

## 2019-06-25 DIAGNOSIS — J309 Allergic rhinitis, unspecified: Secondary | ICD-10-CM

## 2019-06-25 DIAGNOSIS — G8929 Other chronic pain: Secondary | ICD-10-CM | POA: Diagnosis not present

## 2019-06-25 DIAGNOSIS — D631 Anemia in chronic kidney disease: Secondary | ICD-10-CM | POA: Diagnosis not present

## 2019-06-25 DIAGNOSIS — I5033 Acute on chronic diastolic (congestive) heart failure: Secondary | ICD-10-CM | POA: Diagnosis not present

## 2019-06-25 DIAGNOSIS — N186 End stage renal disease: Secondary | ICD-10-CM | POA: Diagnosis not present

## 2019-06-25 DIAGNOSIS — M545 Low back pain: Secondary | ICD-10-CM | POA: Diagnosis not present

## 2019-06-25 DIAGNOSIS — I252 Old myocardial infarction: Secondary | ICD-10-CM | POA: Diagnosis not present

## 2019-06-25 DIAGNOSIS — N185 Chronic kidney disease, stage 5: Secondary | ICD-10-CM

## 2019-06-25 DIAGNOSIS — E1122 Type 2 diabetes mellitus with diabetic chronic kidney disease: Secondary | ICD-10-CM | POA: Diagnosis not present

## 2019-06-25 DIAGNOSIS — E785 Hyperlipidemia, unspecified: Secondary | ICD-10-CM

## 2019-06-25 DIAGNOSIS — I051 Rheumatic mitral insufficiency: Secondary | ICD-10-CM | POA: Diagnosis not present

## 2019-06-25 DIAGNOSIS — I0981 Rheumatic heart failure: Secondary | ICD-10-CM | POA: Diagnosis not present

## 2019-06-25 MED ORDER — ALBUTEROL SULFATE HFA 108 (90 BASE) MCG/ACT IN AERS: 2.0000 | INHALATION_SPRAY | Freq: Four times a day (QID) | RESPIRATORY_TRACT | 3 refills | Status: DC | PRN

## 2019-06-25 NOTE — Progress Notes (Signed)
Patient: Lori Stanley Female    DOB: [birth year]   86 y.o.   MRN: [id number] Visit Date: 06/25/2019  Today's Provider: [name], MD   Chief Complaint  Patient presents with  . Hospitalization Follow-up   Subjective:     HPI    Follow up Hospitalization Transition of care visit for patient after hospitalization for acute on chronic CHF with preserved ejection fraction.  She also has progressed to end-stage renal disease and is being followed by nephrology.  She has access placed in her right upper chest at the catheter placed by vascular surgery. Patient was admitted to [facility] on 06/05/2019 and discharged on 06/17/2019. She was treated for Cardiac arrest/unresponsive . Treatment for this included; see notes. Telephone follow up was done on n/a She reports good compliance with treatment. She reports this condition is Improved. She is sleeping well.  No orthopnea.  He is having no falls.  Niece brings her to the office today. -----------------------------------------------------------   Patient states she feels much improved since hospital discharge.  Her chest is sore from the catheter placement of but otherwise she is improving.  Allergies  Allergen Reactions  . Antihistamines, Chlorpheniramine-Type   . Codeine   . Paxil [Paroxetine]     "makes her feel funny" not in a good way     Current Outpatient Medications:  .  acetaminophen (TYLENOL) 500 MG tablet, Take 1,000 mg by mouth every 6 (six) hours as needed., Disp: , Rfl:  .  albuterol (PROVENTIL HFA;VENTOLIN HFA) 108 (90 Base) MCG/ACT inhaler, Inhale 2 puffs into the lungs every 6 (six) hours as needed for wheezing or shortness of breath., Disp: 1 Inhaler, Rfl: 0 .  amLODipine (NORVASC) 5 MG tablet, Take 1 tablet (5 mg total) by mouth daily., Disp: 90 tablet, Rfl: 3 .  ASPIRIN LOW DOSE 81 MG EC tablet, TAKE 1 TABLET (81 MG TOTAL) BY MOUTH DAILY. (Patient taking differently: Take 81 mg by mouth daily. ),  Disp: 90 tablet, Rfl: 1 .  Bepotastine Besilate (BEPREVE) 1.5 % SOLN, Place 1 drop into both eyes daily., Disp: , Rfl:  .  Blood Glucose Monitoring Suppl (ONE TOUCH ULTRA MINI) w/Device KIT, 1 kit by Does not apply route daily., Disp: 1 kit, Rfl: 0 .  brimonidine (ALPHAGAN) 0.2 % ophthalmic solution, 1 drop 2 (two) times daily. To affected eye(s), Disp: , Rfl:  .  carvedilol (COREG) 12.5 MG tablet, TAKE 1 TABLET (12.5 MG TOTAL) BY MOUTH 2 (TWO) TIMES DAILY., Disp: 180 tablet, Rfl: 1 .  dorzolamide-timolol (COSOPT) 22.3-6.8 MG/ML ophthalmic solution, Place 1 drop into both eyes 2 (two) times daily. , Disp: , Rfl:  .  Insulin Lispro Prot & Lispro (HUMALOG MIX 75/25 KWIKPEN) (75-25) 100 UNIT/ML Kwikpen, Inject 4 Units into the skin 2 (two) times daily., Disp: 16 pen, Rfl: 12 .  Insulin Pen Needle (B-D UF III MINI PEN NEEDLES) 31G X 5 MM MISC, USE TWICE A DAY AS DIRECTED, Disp: 100 each, Rfl: 5 .  latanoprost (XALATAN) 0.005 % ophthalmic solution, Place 1 drop into the left eye at bedtime. , Disp: , Rfl:  .  OneTouch Delica Lancets 02I MISC, USE AS DIRECTED, Disp: 100 each, Rfl: 1 .  ONETOUCH ULTRA test strip, USE AS DIRECTED, Disp: 100 strip, Rfl: 5 .  sertraline (ZOLOFT) 25 MG tablet, TAKE 1 TABLET BY MOUTH EVERY DAY, Disp: 90 tablet, Rfl: 2 .  telmisartan (MICARDIS) 20 MG tablet, TAKE 1 TABLET BY MOUTH  EVERY DAY, Disp: 90 tablet, Rfl: 3 .  torsemide (DEMADEX) 20 MG tablet, Take 2 tablets (40 mg total) by mouth daily. (Patient taking differently: Take 40 mg by mouth daily. Takes Sunday, Tuesday, Wednesday and Saturday), Disp: 60 tablet, Rfl: 3 .  traZODone (DESYREL) 150 MG tablet, Take 150 mg by mouth at bedtime., Disp: , Rfl:  .  Vitamin D, Ergocalciferol, (DRISDOL) 50000 units CAPS capsule, Take 50,000 Units by mouth every 7 (seven) days., Disp: , Rfl:  .  calcitRIOL (ROCALTROL) 0.25 MCG capsule, Take 0.25 mcg by mouth every Monday, Wednesday, and Friday. , Disp: , Rfl:  .  calcitRIOL (ROCALTROL)  0.25 MCG capsule, Take 0.25 mcg by mouth daily., Disp: , Rfl:  .  Doxepin HCl 3 MG TABS, Take 1 tablet (3 mg total) by mouth at bedtime. (Patient not taking: Reported on 06/25/2019), Disp: 30 tablet, Rfl: 0 .  loratadine (CLARITIN) 10 MG tablet, Take 1 tablet (10 mg total) by mouth daily. (Patient not taking: Reported on 06/05/2019), Disp: 90 tablet, Rfl: 3  Review of Systems  Constitutional: Negative for appetite change, chills, fatigue and fever.  HENT: Negative.   Eyes: Negative.   Respiratory: Negative for chest tightness and shortness of breath.   Cardiovascular: Negative for chest pain and palpitations.  Gastrointestinal: Negative for abdominal pain, nausea and vomiting.  Endocrine: Negative.   Genitourinary: Negative.   Musculoskeletal: Positive for arthralgias.  Allergic/Immunologic: Negative.   Neurological: Negative for dizziness and weakness.  Hematological: Negative.   Psychiatric/Behavioral: Negative.     Social History   Tobacco Use  . Smoking status: Former Smoker    Types: Cigarettes  . Smokeless tobacco: Never Used  . Tobacco comment: Quit in 2015-2016  Substance Use Topics  . Alcohol use: No      Objective:   BP (!) 115/54 (BP Location: Left Arm, Patient Position: Sitting, Cuff Size: Large)   Pulse 72   Temp (!) 97.3 F (36.3 C) (Other (Comment))   Resp 18   Ht 5' 3" (1.6 m)   Wt 130 lb (59 kg)   SpO2 98%   BMI 23.03 kg/m  Vitals:   06/25/19 1445  BP: (!) 115/54  Pulse: 72  Resp: 18  Temp: (!) 97.3 F (36.3 C)  TempSrc: Other (Comment)  SpO2: 98%  Weight: 130 lb (59 kg)  Height: 5' 3" (1.6 m)  Body mass index is 23.03 kg/m.   Physical Exam Vitals reviewed.  HENT:     Head: Normocephalic and atraumatic.     Right Ear: External ear normal.     Left Ear: External ear normal.  Eyes:     General: No scleral icterus.    Conjunctiva/sclera: Conjunctivae normal.  Cardiovascular:     Rate and Rhythm: Normal rate and regular rhythm.     Heart  sounds: Normal heart sounds. No friction rub. No gallop.   Pulmonary:     Effort: No respiratory distress.     Breath sounds: Normal breath sounds.  Abdominal:     Palpations: Abdomen is soft.  Musculoskeletal:     Right lower leg: Edema present.     Left lower leg: Edema present.     Comments: Chronic 1+ edema.  Skin:    General: Skin is warm and dry.  Neurological:     General: No focal deficit present.     Mental Status: She is alert and oriented to person, place, and time.  Psychiatric:        Mood and  Affect: Mood normal.        Behavior: Behavior normal.        Thought Content: Thought content normal.        Judgment: Judgment normal.      No results found for any visits on 06/25/19.     Assessment & Plan    1. Acute on chronic diastolic CHF (congestive heart failure) (HCC) Clinically improved.  2. Acute on chronic heart failure with preserved ejection fraction (HFpEF) ([place])   3. Allergic rhinitis, unspecified seasonality, unspecified trigger  - albuterol (VENTOLIN HFA) 108 (90 Base) MCG/ACT inhaler; Inhale 2 puffs into the lungs every 6 (six) hours as needed for wheezing or shortness of breath.  Dispense: 18 g; Refill: 3  4. ESRD (end stage renal disease) ([place]) Catheter placed vascular last week.  Is being prepared for hemodialysis schedule.  5. Acute respiratory failure with hypoxia (HCC) Solved  6. Type 2 diabetes mellitus with hyperlipidemia ([place])   7. Chronic kidney disease (CKD), stage V ([place]) Followed by nephrology  8. Secondary hyperparathyroidism of renal origin ([place])   9. Anemia in stage 5 chronic kidney disease, not on chronic dialysis ([place]) Clinically stable.More than 50% 40 minute visit spent in counseling or coordination of care    Follow up in one month.    I,Lyrika Souders,acting as a scribe for [name], MD.,have documented all relevant documentation on the behalf of [name], MD,as directed by  [name], MD while in the presence of [name], MD.      [name], MD  [facility]

## 2019-06-26 DIAGNOSIS — I132 Hypertensive heart and chronic kidney disease with heart failure and with stage 5 chronic kidney disease, or end stage renal disease: Secondary | ICD-10-CM | POA: Diagnosis not present

## 2019-06-26 DIAGNOSIS — M545 Low back pain: Secondary | ICD-10-CM | POA: Diagnosis not present

## 2019-06-26 DIAGNOSIS — I051 Rheumatic mitral insufficiency: Secondary | ICD-10-CM | POA: Diagnosis not present

## 2019-06-26 DIAGNOSIS — I2510 Atherosclerotic heart disease of native coronary artery without angina pectoris: Secondary | ICD-10-CM | POA: Diagnosis not present

## 2019-06-26 DIAGNOSIS — J9621 Acute and chronic respiratory failure with hypoxia: Secondary | ICD-10-CM | POA: Diagnosis not present

## 2019-06-26 DIAGNOSIS — N179 Acute kidney failure, unspecified: Secondary | ICD-10-CM | POA: Diagnosis not present

## 2019-06-26 DIAGNOSIS — G8929 Other chronic pain: Secondary | ICD-10-CM | POA: Diagnosis not present

## 2019-06-26 DIAGNOSIS — N186 End stage renal disease: Secondary | ICD-10-CM | POA: Diagnosis not present

## 2019-06-26 DIAGNOSIS — E1122 Type 2 diabetes mellitus with diabetic chronic kidney disease: Secondary | ICD-10-CM | POA: Diagnosis not present

## 2019-06-26 DIAGNOSIS — I0981 Rheumatic heart failure: Secondary | ICD-10-CM | POA: Diagnosis not present

## 2019-06-26 DIAGNOSIS — I252 Old myocardial infarction: Secondary | ICD-10-CM | POA: Diagnosis not present

## 2019-06-26 DIAGNOSIS — I5033 Acute on chronic diastolic (congestive) heart failure: Secondary | ICD-10-CM | POA: Diagnosis not present

## 2019-06-26 DIAGNOSIS — D631 Anemia in chronic kidney disease: Secondary | ICD-10-CM | POA: Diagnosis not present

## 2019-06-27 DIAGNOSIS — N179 Acute kidney failure, unspecified: Secondary | ICD-10-CM | POA: Diagnosis not present

## 2019-06-27 DIAGNOSIS — I051 Rheumatic mitral insufficiency: Secondary | ICD-10-CM | POA: Diagnosis not present

## 2019-06-27 DIAGNOSIS — J9621 Acute and chronic respiratory failure with hypoxia: Secondary | ICD-10-CM | POA: Diagnosis not present

## 2019-06-27 DIAGNOSIS — E1122 Type 2 diabetes mellitus with diabetic chronic kidney disease: Secondary | ICD-10-CM | POA: Diagnosis not present

## 2019-06-27 DIAGNOSIS — I0981 Rheumatic heart failure: Secondary | ICD-10-CM | POA: Diagnosis not present

## 2019-06-27 DIAGNOSIS — M545 Low back pain: Secondary | ICD-10-CM | POA: Diagnosis not present

## 2019-06-27 DIAGNOSIS — I252 Old myocardial infarction: Secondary | ICD-10-CM | POA: Diagnosis not present

## 2019-06-27 DIAGNOSIS — N186 End stage renal disease: Secondary | ICD-10-CM | POA: Diagnosis not present

## 2019-06-27 DIAGNOSIS — I2510 Atherosclerotic heart disease of native coronary artery without angina pectoris: Secondary | ICD-10-CM | POA: Diagnosis not present

## 2019-06-27 DIAGNOSIS — I5033 Acute on chronic diastolic (congestive) heart failure: Secondary | ICD-10-CM | POA: Diagnosis not present

## 2019-06-27 DIAGNOSIS — D631 Anemia in chronic kidney disease: Secondary | ICD-10-CM | POA: Diagnosis not present

## 2019-06-27 DIAGNOSIS — I132 Hypertensive heart and chronic kidney disease with heart failure and with stage 5 chronic kidney disease, or end stage renal disease: Secondary | ICD-10-CM | POA: Diagnosis not present

## 2019-06-27 DIAGNOSIS — G8929 Other chronic pain: Secondary | ICD-10-CM | POA: Diagnosis not present

## 2019-06-28 DIAGNOSIS — N186 End stage renal disease: Secondary | ICD-10-CM | POA: Diagnosis not present

## 2019-06-28 DIAGNOSIS — Z992 Dependence on renal dialysis: Secondary | ICD-10-CM | POA: Diagnosis not present

## 2019-06-30 DIAGNOSIS — Z992 Dependence on renal dialysis: Secondary | ICD-10-CM | POA: Diagnosis not present

## 2019-06-30 DIAGNOSIS — N186 End stage renal disease: Secondary | ICD-10-CM | POA: Diagnosis not present

## 2019-07-01 DIAGNOSIS — N2581 Secondary hyperparathyroidism of renal origin: Secondary | ICD-10-CM | POA: Diagnosis not present

## 2019-07-01 DIAGNOSIS — Z992 Dependence on renal dialysis: Secondary | ICD-10-CM | POA: Diagnosis not present

## 2019-07-01 DIAGNOSIS — D509 Iron deficiency anemia, unspecified: Secondary | ICD-10-CM | POA: Diagnosis not present

## 2019-07-01 DIAGNOSIS — N186 End stage renal disease: Secondary | ICD-10-CM | POA: Diagnosis not present

## 2019-07-01 LAB — BLOOD GAS, ARTERIAL
Acid-base deficit: 3.2 mmol/L — ABNORMAL HIGH (ref 0.0–2.0)
Bicarbonate: 27.9 mmol/L (ref 20.0–28.0)
FIO2: 1
O2 Saturation: 75 %
Patient temperature: 37
pCO2 arterial: 82 mmHg (ref 32.0–48.0)
pH, Arterial: 7.14 — CL (ref 7.350–7.450)
pO2, Arterial: 53 mmHg — ABNORMAL LOW (ref 83.0–108.0)

## 2019-07-02 DIAGNOSIS — I2510 Atherosclerotic heart disease of native coronary artery without angina pectoris: Secondary | ICD-10-CM | POA: Diagnosis not present

## 2019-07-02 DIAGNOSIS — D631 Anemia in chronic kidney disease: Secondary | ICD-10-CM | POA: Diagnosis not present

## 2019-07-02 DIAGNOSIS — I051 Rheumatic mitral insufficiency: Secondary | ICD-10-CM | POA: Diagnosis not present

## 2019-07-02 DIAGNOSIS — G8929 Other chronic pain: Secondary | ICD-10-CM | POA: Diagnosis not present

## 2019-07-02 DIAGNOSIS — N179 Acute kidney failure, unspecified: Secondary | ICD-10-CM | POA: Diagnosis not present

## 2019-07-02 DIAGNOSIS — I132 Hypertensive heart and chronic kidney disease with heart failure and with stage 5 chronic kidney disease, or end stage renal disease: Secondary | ICD-10-CM | POA: Diagnosis not present

## 2019-07-02 DIAGNOSIS — I0981 Rheumatic heart failure: Secondary | ICD-10-CM | POA: Diagnosis not present

## 2019-07-02 DIAGNOSIS — N186 End stage renal disease: Secondary | ICD-10-CM | POA: Diagnosis not present

## 2019-07-02 DIAGNOSIS — I252 Old myocardial infarction: Secondary | ICD-10-CM | POA: Diagnosis not present

## 2019-07-02 DIAGNOSIS — J9621 Acute and chronic respiratory failure with hypoxia: Secondary | ICD-10-CM | POA: Diagnosis not present

## 2019-07-02 DIAGNOSIS — M545 Low back pain: Secondary | ICD-10-CM | POA: Diagnosis not present

## 2019-07-02 DIAGNOSIS — I5033 Acute on chronic diastolic (congestive) heart failure: Secondary | ICD-10-CM | POA: Diagnosis not present

## 2019-07-02 DIAGNOSIS — E1122 Type 2 diabetes mellitus with diabetic chronic kidney disease: Secondary | ICD-10-CM | POA: Diagnosis not present

## 2019-07-03 ENCOUNTER — Ambulatory Visit: Payer: Medicare Other | Admitting: Family

## 2019-07-04 DIAGNOSIS — J9621 Acute and chronic respiratory failure with hypoxia: Secondary | ICD-10-CM | POA: Diagnosis not present

## 2019-07-04 DIAGNOSIS — I132 Hypertensive heart and chronic kidney disease with heart failure and with stage 5 chronic kidney disease, or end stage renal disease: Secondary | ICD-10-CM | POA: Diagnosis not present

## 2019-07-04 DIAGNOSIS — I051 Rheumatic mitral insufficiency: Secondary | ICD-10-CM | POA: Diagnosis not present

## 2019-07-04 DIAGNOSIS — G8929 Other chronic pain: Secondary | ICD-10-CM | POA: Diagnosis not present

## 2019-07-04 DIAGNOSIS — N179 Acute kidney failure, unspecified: Secondary | ICD-10-CM | POA: Diagnosis not present

## 2019-07-04 DIAGNOSIS — I5033 Acute on chronic diastolic (congestive) heart failure: Secondary | ICD-10-CM | POA: Diagnosis not present

## 2019-07-04 DIAGNOSIS — E1122 Type 2 diabetes mellitus with diabetic chronic kidney disease: Secondary | ICD-10-CM | POA: Diagnosis not present

## 2019-07-04 DIAGNOSIS — I0981 Rheumatic heart failure: Secondary | ICD-10-CM | POA: Diagnosis not present

## 2019-07-04 DIAGNOSIS — M545 Low back pain: Secondary | ICD-10-CM | POA: Diagnosis not present

## 2019-07-04 DIAGNOSIS — I252 Old myocardial infarction: Secondary | ICD-10-CM | POA: Diagnosis not present

## 2019-07-04 DIAGNOSIS — D631 Anemia in chronic kidney disease: Secondary | ICD-10-CM | POA: Diagnosis not present

## 2019-07-04 DIAGNOSIS — N186 End stage renal disease: Secondary | ICD-10-CM | POA: Diagnosis not present

## 2019-07-04 DIAGNOSIS — I2510 Atherosclerotic heart disease of native coronary artery without angina pectoris: Secondary | ICD-10-CM | POA: Diagnosis not present

## 2019-07-05 DIAGNOSIS — Z992 Dependence on renal dialysis: Secondary | ICD-10-CM | POA: Diagnosis not present

## 2019-07-05 DIAGNOSIS — N186 End stage renal disease: Secondary | ICD-10-CM | POA: Diagnosis not present

## 2019-07-05 NOTE — Progress Notes (Deleted)
Patient: Lori Stanley Female    DOB: 08-25-1931   86 y.o.   MRN: [id number] Visit Date: 07/05/2019  Today's Provider: [name], MD   No chief complaint on file.  Subjective:     HPI   Type 2 diabetes mellitus with stage 5 chronic kidney disease not on chronic dialysis, with long-term current use of insulin ([place]) From 04/10/2019-Lower insulin dose to 2 units in the morning and 2 units at night.  I am worried about hypoglycemia in this 86 year old. Hemoglobin A1c 6.0. Nephrology following stage V kidney disease.  Essential (primary) hypertension From 04/10/2019-Fair control.  Allergies  Allergen Reactions  . Antihistamines, Chlorpheniramine-Type   . Codeine   . Paxil [Paroxetine]     "makes her feel funny" not in a good way     Current Outpatient Medications:  .  acetaminophen (TYLENOL) 500 MG tablet, Take 1,000 mg by mouth every 6 (six) hours as needed., Disp: , Rfl:  .  albuterol (VENTOLIN HFA) 108 (90 Base) MCG/ACT inhaler, Inhale 2 puffs into the lungs every 6 (six) hours as needed for wheezing or shortness of breath., Disp: 18 g, Rfl: 3 .  amLODipine (NORVASC) 5 MG tablet, Take 1 tablet (5 mg total) by mouth daily., Disp: 90 tablet, Rfl: 3 .  ASPIRIN LOW DOSE 81 MG EC tablet, TAKE 1 TABLET (81 MG TOTAL) BY MOUTH DAILY. (Patient taking differently: Take 81 mg by mouth daily. ), Disp: 90 tablet, Rfl: 1 .  Bepotastine Besilate (BEPREVE) 1.5 % SOLN, Place 1 drop into both eyes daily., Disp: , Rfl:  .  Blood Glucose Monitoring Suppl (ONE TOUCH ULTRA MINI) w/Device KIT, 1 kit by Does not apply route daily., Disp: 1 kit, Rfl: 0 .  brimonidine (ALPHAGAN) 0.2 % ophthalmic solution, 1 drop 2 (two) times daily. To affected eye(s), Disp: , Rfl:  .  calcitRIOL (ROCALTROL) 0.25 MCG capsule, Take 0.25 mcg by mouth every Monday, Wednesday, and Friday. , Disp: , Rfl:  .  calcitRIOL (ROCALTROL) 0.25 MCG capsule, Take 0.25 mcg by mouth daily., Disp: , Rfl:  .  carvedilol  (COREG) 12.5 MG tablet, TAKE 1 TABLET (12.5 MG TOTAL) BY MOUTH 2 (TWO) TIMES DAILY., Disp: 180 tablet, Rfl: 1 .  dorzolamide-timolol (COSOPT) 22.3-6.8 MG/ML ophthalmic solution, Place 1 drop into both eyes 2 (two) times daily. , Disp: , Rfl:  .  Doxepin HCl 3 MG TABS, Take 1 tablet (3 mg total) by mouth at bedtime. (Patient not taking: Reported on 06/25/2019), Disp: 30 tablet, Rfl: 0 .  Insulin Lispro Prot & Lispro (HUMALOG MIX 75/25 KWIKPEN) (75-25) 100 UNIT/ML Kwikpen, Inject 4 Units into the skin 2 (two) times daily., Disp: 16 pen, Rfl: 12 .  Insulin Pen Needle (B-D UF III MINI PEN NEEDLES) 31G X 5 MM MISC, USE TWICE A DAY AS DIRECTED, Disp: 100 each, Rfl: 5 .  latanoprost (XALATAN) 0.005 % ophthalmic solution, Place 1 drop into the left eye at bedtime. , Disp: , Rfl:  .  loratadine (CLARITIN) 10 MG tablet, Take 1 tablet (10 mg total) by mouth daily. (Patient not taking: Reported on 06/05/2019), Disp: 90 tablet, Rfl: 3 .  OneTouch Delica Lancets 70Y MISC, USE AS DIRECTED, Disp: 100 each, Rfl: 1 .  ONETOUCH ULTRA test strip, USE AS DIRECTED, Disp: 100 strip, Rfl: 5 .  sertraline (ZOLOFT) 25 MG tablet, TAKE 1 TABLET BY MOUTH EVERY DAY, Disp: 90 tablet, Rfl: 2 .  telmisartan (MICARDIS) 20 MG tablet, TAKE  1 TABLET BY MOUTH EVERY DAY, Disp: 90 tablet, Rfl: 3 .  torsemide (DEMADEX) 20 MG tablet, Take 2 tablets (40 mg total) by mouth daily. (Patient taking differently: Take 40 mg by mouth daily. Takes Sunday, Tuesday, Wednesday and Saturday), Disp: 60 tablet, Rfl: 3 .  traZODone (DESYREL) 150 MG tablet, Take 150 mg by mouth at bedtime., Disp: , Rfl:  .  Vitamin D, Ergocalciferol, (DRISDOL) 50000 units CAPS capsule, Take 50,000 Units by mouth every 7 (seven) days., Disp: , Rfl:   Review of Systems  Constitutional: Negative for appetite change, chills, fatigue and fever.  Respiratory: Negative for chest tightness and shortness of breath.   Cardiovascular: Negative for chest pain and palpitations.    Gastrointestinal: Negative for abdominal pain, nausea and vomiting.  Neurological: Negative for dizziness and weakness.    Social History   Tobacco Use  . Smoking status: Former Smoker    Types: Cigarettes  . Smokeless tobacco: Never Used  . Tobacco comment: Quit in 2015-2016  Substance Use Topics  . Alcohol use: No      Objective:   There were no vitals taken for this visit. There were no vitals filed for this visit.There is no height or weight on file to calculate BMI.   Physical Exam   No results found for any visits on 07/08/19.     Assessment & Plan        [name], MD  [facility]

## 2019-07-08 ENCOUNTER — Ambulatory Visit: Payer: Self-pay | Admitting: Family Medicine

## 2019-07-08 DIAGNOSIS — N186 End stage renal disease: Secondary | ICD-10-CM | POA: Diagnosis not present

## 2019-07-08 DIAGNOSIS — Z992 Dependence on renal dialysis: Secondary | ICD-10-CM | POA: Diagnosis not present

## 2019-07-08 DIAGNOSIS — N2581 Secondary hyperparathyroidism of renal origin: Secondary | ICD-10-CM | POA: Diagnosis not present

## 2019-07-10 DIAGNOSIS — R809 Proteinuria, unspecified: Secondary | ICD-10-CM | POA: Diagnosis not present

## 2019-07-10 DIAGNOSIS — D631 Anemia in chronic kidney disease: Secondary | ICD-10-CM | POA: Diagnosis not present

## 2019-07-10 DIAGNOSIS — N185 Chronic kidney disease, stage 5: Secondary | ICD-10-CM | POA: Diagnosis not present

## 2019-07-10 DIAGNOSIS — N2581 Secondary hyperparathyroidism of renal origin: Secondary | ICD-10-CM | POA: Diagnosis not present

## 2019-07-10 DIAGNOSIS — E1122 Type 2 diabetes mellitus with diabetic chronic kidney disease: Secondary | ICD-10-CM | POA: Diagnosis not present

## 2019-07-11 NOTE — Progress Notes (Signed)
This encounter was created in error - please disregard.

## 2019-07-12 DIAGNOSIS — E1122 Type 2 diabetes mellitus with diabetic chronic kidney disease: Secondary | ICD-10-CM | POA: Diagnosis not present

## 2019-07-12 DIAGNOSIS — I252 Old myocardial infarction: Secondary | ICD-10-CM | POA: Diagnosis not present

## 2019-07-12 DIAGNOSIS — I2510 Atherosclerotic heart disease of native coronary artery without angina pectoris: Secondary | ICD-10-CM | POA: Diagnosis not present

## 2019-07-12 DIAGNOSIS — I0981 Rheumatic heart failure: Secondary | ICD-10-CM | POA: Diagnosis not present

## 2019-07-12 DIAGNOSIS — G8929 Other chronic pain: Secondary | ICD-10-CM | POA: Diagnosis not present

## 2019-07-12 DIAGNOSIS — J9621 Acute and chronic respiratory failure with hypoxia: Secondary | ICD-10-CM | POA: Diagnosis not present

## 2019-07-12 DIAGNOSIS — N179 Acute kidney failure, unspecified: Secondary | ICD-10-CM | POA: Diagnosis not present

## 2019-07-12 DIAGNOSIS — M545 Low back pain: Secondary | ICD-10-CM | POA: Diagnosis not present

## 2019-07-12 DIAGNOSIS — I132 Hypertensive heart and chronic kidney disease with heart failure and with stage 5 chronic kidney disease, or end stage renal disease: Secondary | ICD-10-CM | POA: Diagnosis not present

## 2019-07-12 DIAGNOSIS — N186 End stage renal disease: Secondary | ICD-10-CM | POA: Diagnosis not present

## 2019-07-12 DIAGNOSIS — D631 Anemia in chronic kidney disease: Secondary | ICD-10-CM | POA: Diagnosis not present

## 2019-07-12 DIAGNOSIS — I5033 Acute on chronic diastolic (congestive) heart failure: Secondary | ICD-10-CM | POA: Diagnosis not present

## 2019-07-12 DIAGNOSIS — I051 Rheumatic mitral insufficiency: Secondary | ICD-10-CM | POA: Diagnosis not present

## 2019-07-15 NOTE — Progress Notes (Signed)
Patient ID: Lori Stanley, female    DOB: 07/08/1931, 86 y.o.   MRN: [id number]  HPI  Lori Stanley is a 86 y/o female with a history of DM, hyperlipidemia, HTN, CKD, previous tobacco use and chronic heart failure.   Echo report from 06/05/19 reviewed and showed an EF of 60-65% along with mildly elevated PA pressure and a 7cm left pleural effusion.   Admitted 06/05/19 due to acute on chronic HF. Cardiology and vascular consults obtained. Initially was intubated and had IV lasix and lasix gtt. Transitioned to oral diuretics. Needed dialysis. Given empiric antibiotics. Discharged after 12 days with oxygen.  She presents today for her initial visit with a chief complaint of minimal fatigue upon moderate exertion. She describes this as chronic in nature having been present for several months. She has associated rhinorrhea and pedal edema along with this. She denies any difficulty sleeping, dizziness, abdominal distention, palpitations, chest pain, shortness of breath, cough or weight gain.   Has had dialysis previously but is currently not having to do dialysis.   Past Medical History:  Diagnosis Date  . Acute heart failure ([place])   . CHF (congestive heart failure) ([place])   . Chronic kidney disease   . Chronic low back pain   . Diabetes mellitus without complication ([place])   . Hyperlipidemia   . Hypertension   . MI (myocardial infarction) ([place])   . Obesity    Past Surgical History:  Procedure Laterality Date  . DIALYSIS/PERMA CATHETER INSERTION N/A 06/07/2019   Procedure: DIALYSIS/PERMA CATHETER INSERTION;  Surgeon: Katha Cabal, MD;  Location: [facility];  Service: Cardiovascular;  Laterality: N/A;  . EYE SURGERY    . KNEE SURGERY    . THYROID SURGERY    . VAGINAL HYSTERECTOMY     Family History  Problem Relation Age of Onset  . Heart attack Mother   . Heart disease Sister   . Cancer Sister    Social History   Tobacco Use  . Smoking status: Former Smoker     Types: Cigarettes  . Smokeless tobacco: Never Used  . Tobacco comment: Quit in 2015-2016  Substance Use Topics  . Alcohol use: No   Allergies  Allergen Reactions  . Antihistamines, Chlorpheniramine-Type   . Codeine   . Paxil [Paroxetine]     "makes her feel funny" not in a good way   Prior to Admission medications   Medication Sig Start Date End Date Taking? Authorizing Provider  acetaminophen (TYLENOL) 500 MG tablet Take 1,000 mg by mouth every 6 (six) hours as needed.   Yes [provider]  albuterol (VENTOLIN HFA) 108 (90 Base) MCG/ACT inhaler Inhale 2 puffs into the lungs every 6 (six) hours as needed for wheezing or shortness of breath. 06/25/19  Yes Jerrol Banana., MD  amLODipine (NORVASC) 5 MG tablet Take 1 tablet (5 mg total) by mouth daily. 11/05/18  Yes Jerrol Banana., MD  ASPIRIN LOW DOSE 81 MG EC tablet TAKE 1 TABLET (81 MG TOTAL) BY MOUTH DAILY. Patient taking differently: Take 81 mg by mouth daily.  06/02/19  Yes Jerrol Banana., MD  Bepotastine Besilate (BEPREVE) 1.5 % SOLN Place 1 drop into both eyes daily.   Yes [provider]  Blood Glucose Monitoring Suppl (ONE TOUCH ULTRA MINI) w/Device KIT 1 kit by Does not apply route daily. 01/22/19  Yes Jerrol Banana., MD  brimonidine [facility]) 0.2 % ophthalmic solution 1 drop 2 (two)  times daily. To affected eye(s) 03/06/19  Yes [provider]  calcitRIOL (ROCALTROL) 0.25 MCG capsule Take 0.25 mcg by mouth every Monday, Wednesday, and Friday.  10/23/18  Yes [provider]  carvedilol (COREG) 12.5 MG tablet TAKE 1 TABLET (12.5 MG TOTAL) BY MOUTH 2 (TWO) TIMES DAILY. 06/02/19  Yes Jerrol Banana., MD  dorzolamide-timolol (COSOPT) 22.3-6.8 MG/ML ophthalmic solution Place 1 drop into both eyes 2 (two) times daily.  03/06/19  Yes [provider]  Insulin Lispro Prot & Lispro (HUMALOG MIX 75/25 KWIKPEN) (75-25) 100 UNIT/ML Kwikpen Inject 4 Units into the skin 2  (two) times daily. Patient taking differently: Inject 1 Units into the skin 2 (two) times daily.  07/03/18  Yes Jerrol Banana., MD  Insulin Pen Needle (B-D UF III MINI PEN NEEDLES) 31G X 5 MM MISC USE TWICE A DAY AS DIRECTED 02/11/19  Yes Jerrol Banana., MD  latanoprost (XALATAN) 0.005 % ophthalmic solution Place 1 drop into the left eye at bedtime.  02/11/15  Yes [provider]  loratadine (CLARITIN) 10 MG tablet Take 1 tablet (10 mg total) by mouth daily. 12/15/14  Yes Jerrol Banana., MD  OneTouch Delica Lancets 53G MISC USE AS DIRECTED 05/10/19  Yes Jerrol Banana., MD  [facility] ULTRA test strip USE AS DIRECTED 04/20/19  Yes Jerrol Banana., MD  sertraline (ZOLOFT) 25 MG tablet TAKE 1 TABLET BY MOUTH EVERY DAY 04/02/19  Yes Jerrol Banana., MD  telmisartan (MICARDIS) 20 MG tablet TAKE 1 TABLET BY MOUTH EVERY DAY Patient taking differently: Take 40 mg by mouth daily.  06/02/19  Yes Jerrol Banana., MD  torsemide (DEMADEX) 20 MG tablet Take 2 tablets (40 mg total) by mouth daily. 06/18/19 07/18/19 Yes Jerrol Banana., MD  traZODone (DESYREL) 150 MG tablet Take 150 mg by mouth at bedtime.   Yes [provider]  Vitamin D, Ergocalciferol, (DRISDOL) 50000 units CAPS capsule Take 50,000 Units by mouth every 7 (seven) days.   Yes [provider]      Review of Systems  Constitutional: Positive for fatigue (very little). Negative for appetite change.  HENT: Positive for rhinorrhea. Negative for congestion and sore throat.   Eyes: Negative.   Respiratory: Negative for cough and shortness of breath.   Cardiovascular: Positive for leg swelling. Negative for chest pain and palpitations.  Gastrointestinal: Negative for abdominal distention and abdominal pain.  Endocrine: Negative.   Genitourinary: Negative.        Does still produce urine  Musculoskeletal: Negative for back pain and neck pain.  Skin: Negative.    Allergic/Immunologic: Negative.   Neurological: Negative for dizziness, light-headedness and headaches.  Hematological: Negative for adenopathy. Does not bruise/bleed easily.  Psychiatric/Behavioral: Negative for dysphoric mood and sleep disturbance (sleeping on 1 pillow with oxygen @ 2L). The patient is not nervous/anxious.    Vitals:   07/16/19 1032  BP: (!) 152/61  Pulse: 69  Resp: 18  SpO2: 95%  Weight: 137 lb 9.6 oz (62.4 kg)  Height: 5' 3"  (1.6 m)   Wt Readings from Last 3 Encounters:  07/16/19 137 lb 9.6 oz (62.4 kg)  06/25/19 130 lb (59 kg)  06/17/19 130 lb 4.7 oz (59.1 kg)   Lab Results  Component Value Date   CREATININE 2.81 (H) 06/17/2019   CREATININE 2.56 (H) 06/16/2019   CREATININE 1.66 (H) 06/15/2019    Physical Exam Vitals and nursing note reviewed.  Constitutional:      Appearance: Normal appearance.  HENT:     Head: Normocephalic and atraumatic.  Cardiovascular:     Rate and Rhythm: Normal rate and regular rhythm.  Pulmonary:     Effort: Pulmonary effort is normal. No respiratory distress.     Breath sounds: No wheezing or rales.  Abdominal:     General: Abdomen is flat. There is no distension.     Palpations: Abdomen is soft.  Musculoskeletal:        General: No tenderness.     Cervical back: Normal range of motion and neck supple.     Right lower leg: Edema (2+ pitting) present.     Left lower leg: Edema (2+pitting) present.  Skin:    General: Skin is warm and dry.  Neurological:     General: No focal deficit present.     Mental Status: She is alert and oriented to person, place, and time.  Psychiatric:        Mood and Affect: Mood normal.        Behavior: Behavior normal.    Assessment & Plan:  1: Chronic heart failure with preserved ejection fraction- - NYHA class II - euvolemic today - weighing daily and she says that her weight has been stable; instructed to call for an overnight weight gain of >2 pounds or a weekly weight gain of  >5 pounds - not adding salt to her food and tries to eat low sodium foods; reviewed the importance of closely following a 2030m sodium diet and written dietary information was given to her about this [facility] - BNP 06/06/19 was 549.0 - reports receiving her flu vaccine for this season  2: HTN- - BP mildly elevated today; telmisartan was just increased yesterday to 463mdaily by nephrology - saw PCP (GRosanna Randy2/23/21 - BMP from 06/17/19 reviewed and showed sodium 139, potassium 3.7, creatinine 2.81 and GFR 17  3: DM-  - A1c 04/10/2019 was 6.0% - nonfasting glucose in clinic today was 124  4: CKD stage IV- - has been on dialysis before but is currently not doing that - does produce urine - saw nephrology (TMaia Breslow3/10/21 and returns tomorrow  5: Lymphedema- - stage 2 - limited in her ability to exercise due to being unsteady - has worn compression socks in the past but says that they "cut into her leg"; encouraged her and her niece to get a larger size and try them again with putting them on every morning with removal at bedtime; may need to get measured at a medical supply store - does elevate her legs at night with some improvement in her swelling; encouraged her to elevate them during the day when sitting for long periods of time - discussed lymphapress compression boots if edema persists; brochure given to patient   Medication bottles were reviewed.   Return in 6 weeks or sooner for any questions/problems before then.

## 2019-07-16 ENCOUNTER — Other Ambulatory Visit: Payer: Self-pay

## 2019-07-16 ENCOUNTER — Ambulatory Visit: Payer: Medicare Other | Attending: Family | Admitting: Family

## 2019-07-16 ENCOUNTER — Encounter: Payer: Self-pay | Admitting: Family

## 2019-07-16 VITALS — BP 152/61 | HR 69 | Resp 18 | Ht 63.0 in | Wt 137.6 lb

## 2019-07-16 DIAGNOSIS — I130 Hypertensive heart and chronic kidney disease with heart failure and stage 1 through stage 4 chronic kidney disease, or unspecified chronic kidney disease: Secondary | ICD-10-CM | POA: Diagnosis not present

## 2019-07-16 DIAGNOSIS — Z8249 Family history of ischemic heart disease and other diseases of the circulatory system: Secondary | ICD-10-CM | POA: Diagnosis not present

## 2019-07-16 DIAGNOSIS — Z7982 Long term (current) use of aspirin: Secondary | ICD-10-CM | POA: Diagnosis not present

## 2019-07-16 DIAGNOSIS — E785 Hyperlipidemia, unspecified: Secondary | ICD-10-CM | POA: Diagnosis not present

## 2019-07-16 DIAGNOSIS — Z79899 Other long term (current) drug therapy: Secondary | ICD-10-CM | POA: Insufficient documentation

## 2019-07-16 DIAGNOSIS — I5022 Chronic systolic (congestive) heart failure: Secondary | ICD-10-CM | POA: Diagnosis not present

## 2019-07-16 DIAGNOSIS — J3489 Other specified disorders of nose and nasal sinuses: Secondary | ICD-10-CM | POA: Insufficient documentation

## 2019-07-16 DIAGNOSIS — I5032 Chronic diastolic (congestive) heart failure: Secondary | ICD-10-CM | POA: Diagnosis not present

## 2019-07-16 DIAGNOSIS — I10 Essential (primary) hypertension: Secondary | ICD-10-CM

## 2019-07-16 DIAGNOSIS — Z87891 Personal history of nicotine dependence: Secondary | ICD-10-CM | POA: Insufficient documentation

## 2019-07-16 DIAGNOSIS — E1122 Type 2 diabetes mellitus with diabetic chronic kidney disease: Secondary | ICD-10-CM | POA: Diagnosis not present

## 2019-07-16 DIAGNOSIS — N184 Chronic kidney disease, stage 4 (severe): Secondary | ICD-10-CM | POA: Diagnosis not present

## 2019-07-16 DIAGNOSIS — Z885 Allergy status to narcotic agent status: Secondary | ICD-10-CM | POA: Diagnosis not present

## 2019-07-16 DIAGNOSIS — I252 Old myocardial infarction: Secondary | ICD-10-CM | POA: Diagnosis not present

## 2019-07-16 DIAGNOSIS — Z794 Long term (current) use of insulin: Secondary | ICD-10-CM | POA: Insufficient documentation

## 2019-07-16 DIAGNOSIS — I890 Lymphedema, not elsewhere classified: Secondary | ICD-10-CM | POA: Diagnosis not present

## 2019-07-16 LAB — GLUCOSE, CAPILLARY: Glucose-Capillary: 124 mg/dL — ABNORMAL HIGH (ref 70–99)

## 2019-07-16 NOTE — Patient Instructions (Signed)
Continue weighing daily and call for an overnight weight gain of > 2 pounds or a weekly weight gain of >5 pounds. 

## 2019-07-17 DIAGNOSIS — R8290 Unspecified abnormal findings in urine: Secondary | ICD-10-CM | POA: Diagnosis not present

## 2019-07-17 DIAGNOSIS — D631 Anemia in chronic kidney disease: Secondary | ICD-10-CM | POA: Diagnosis not present

## 2019-07-17 DIAGNOSIS — N185 Chronic kidney disease, stage 5: Secondary | ICD-10-CM | POA: Diagnosis not present

## 2019-07-17 DIAGNOSIS — R809 Proteinuria, unspecified: Secondary | ICD-10-CM | POA: Diagnosis not present

## 2019-07-17 DIAGNOSIS — N2581 Secondary hyperparathyroidism of renal origin: Secondary | ICD-10-CM | POA: Diagnosis not present

## 2019-07-17 DIAGNOSIS — E1122 Type 2 diabetes mellitus with diabetic chronic kidney disease: Secondary | ICD-10-CM | POA: Diagnosis not present

## 2019-07-22 NOTE — Progress Notes (Signed)
Patient: Lori Stanley Female    DOB: Apr 23, 1932   86 y.o.   MRN: [id number] Visit Date: 07/23/2019  Today's Provider: [name], MD   Chief Complaint  Patient presents with  . Diabetes Mellitus   Subjective:     HPI  Overall patient is feeling okay.  She is seeing nephrology regularly. She is taking Coreg 12.52 tablets twice daily. 1. Type 2 diabetes mellitus with hyperlipidemia ([place]) She is taking Humalog mix 75/25 1 unit twice a day.  She denies any known hypoglycemic episodes but her last A1c was 6.0 and today is 6.0. Allergies  Allergen Reactions  . Antihistamines, Chlorpheniramine-Type   . Codeine   . Paxil [Paroxetine]     "makes her feel funny" not in a good way     Current Outpatient Medications:  .  acetaminophen (TYLENOL) 500 MG tablet, Take 1,000 mg by mouth every 6 (six) hours as needed., Disp: , Rfl:  .  albuterol (VENTOLIN HFA) 108 (90 Base) MCG/ACT inhaler, Inhale 2 puffs into the lungs every 6 (six) hours as needed for wheezing or shortness of breath., Disp: 18 g, Rfl: 3 .  amLODipine (NORVASC) 5 MG tablet, Take 1 tablet (5 mg total) by mouth daily., Disp: 90 tablet, Rfl: 3 .  ASPIRIN LOW DOSE 81 MG EC tablet, TAKE 1 TABLET (81 MG TOTAL) BY MOUTH DAILY. (Patient taking differently: Take 81 mg by mouth daily. ), Disp: 90 tablet, Rfl: 1 .  Bepotastine Besilate (BEPREVE) 1.5 % SOLN, Place 1 drop into both eyes daily., Disp: , Rfl:  .  Blood Glucose Monitoring Suppl (ONE TOUCH ULTRA MINI) w/Device KIT, 1 kit by Does not apply route daily., Disp: 1 kit, Rfl: 0 .  brimonidine (ALPHAGAN) 0.2 % ophthalmic solution, 1 drop 2 (two) times daily. To affected eye(s), Disp: , Rfl:  .  calcitRIOL (ROCALTROL) 0.25 MCG capsule, Take 0.25 mcg by mouth every Monday, Wednesday, and Friday. , Disp: , Rfl:  .  carvedilol (COREG) 12.5 MG tablet, TAKE 1 TABLET (12.5 MG TOTAL) BY MOUTH 2 (TWO) TIMES DAILY., Disp: 180 tablet, Rfl: 1 .  dorzolamide-timolol (COSOPT)  22.3-6.8 MG/ML ophthalmic solution, Place 1 drop into both eyes 2 (two) times daily. , Disp: , Rfl:  .  Insulin Lispro Prot & Lispro (HUMALOG MIX 75/25 KWIKPEN) (75-25) 100 UNIT/ML Kwikpen, Inject 4 Units into the skin 2 (two) times daily. (Patient taking differently: Inject 1 Units into the skin 2 (two) times daily. ), Disp: 16 pen, Rfl: 12 .  Insulin Pen Needle (B-D UF III MINI PEN NEEDLES) 31G X 5 MM MISC, USE TWICE A DAY AS DIRECTED, Disp: 100 each, Rfl: 5 .  latanoprost (XALATAN) 0.005 % ophthalmic solution, Place 1 drop into the left eye at bedtime. , Disp: , Rfl:  .  loratadine (CLARITIN) 10 MG tablet, Take 1 tablet (10 mg total) by mouth daily., Disp: 90 tablet, Rfl: 3 .  OneTouch Delica Lancets 40J MISC, USE AS DIRECTED, Disp: 100 each, Rfl: 1 .  ONETOUCH ULTRA test strip, USE AS DIRECTED, Disp: 100 strip, Rfl: 5 .  sertraline (ZOLOFT) 25 MG tablet, TAKE 1 TABLET BY MOUTH EVERY DAY, Disp: 90 tablet, Rfl: 2 .  telmisartan (MICARDIS) 20 MG tablet, TAKE 1 TABLET BY MOUTH EVERY DAY (Patient taking differently: Take 40 mg by mouth daily. ), Disp: 90 tablet, Rfl: 3 .  traZODone (DESYREL) 150 MG tablet, Take 150 mg by mouth at bedtime., Disp: , Rfl:  .  Vitamin D, Ergocalciferol, (DRISDOL) 50000 units CAPS capsule, Take 50,000 Units by mouth every 7 (seven) days., Disp: , Rfl:  .  torsemide (DEMADEX) 20 MG tablet, Take 2 tablets (40 mg total) by mouth daily., Disp: 60 tablet, Rfl: 3  Review of Systems  Constitutional: Negative for appetite change, chills, fatigue and fever.  HENT: Negative.   Eyes: Negative.   Respiratory: Negative for chest tightness and shortness of breath.   Cardiovascular: Negative for chest pain and palpitations.  Gastrointestinal: Negative for abdominal pain, nausea and vomiting.  Endocrine: Negative.   Musculoskeletal: Positive for arthralgias.  Allergic/Immunologic: Negative.   Neurological: Negative for dizziness and weakness.  Hematological: Negative.     Psychiatric/Behavioral: Negative.     Social History   Tobacco Use  . Smoking status: Former Smoker    Types: Cigarettes  . Smokeless tobacco: Never Used  . Tobacco comment: Quit in 2015-2016  Substance Use Topics  . Alcohol use: No      Objective:   BP (!) 148/55 (BP Location: Right Arm, Patient Position: Sitting, Cuff Size: Normal)   Pulse 72   Temp (!) 97.3 F (36.3 C) (Temporal)   Wt 138 lb (62.6 kg)   BMI 24.45 kg/m  Vitals:   07/23/19 1118  BP: (!) 148/55  Pulse: 72  Temp: (!) 97.3 F (36.3 C)  TempSrc: Temporal  Weight: 138 lb (62.6 kg)  Body mass index is 24.45 kg/m.   Physical Exam Vitals reviewed.  HENT:     Head: Normocephalic and atraumatic.     Right Ear: External ear normal.     Left Ear: External ear normal.  Eyes:     General: No scleral icterus.    Conjunctiva/sclera: Conjunctivae normal.  Cardiovascular:     Rate and Rhythm: Normal rate and regular rhythm.     Heart sounds: Normal heart sounds. No friction rub. No gallop.   Pulmonary:     Effort: No respiratory distress.     Breath sounds: Normal breath sounds.  Abdominal:     Palpations: Abdomen is soft.  Musculoskeletal:     Right lower leg: Edema present.     Left lower leg: Edema present.     Comments: Chronic 2+ LE edema.  Skin:    General: Skin is warm and dry.  Neurological:     General: No focal deficit present.     Mental Status: She is alert and oriented to person, place, and time.  Psychiatric:        Mood and Affect: Mood normal.        Behavior: Behavior normal.        Thought Content: Thought content normal.        Judgment: Judgment normal.      No results found for any visits on 07/23/19.     Assessment & Plan    1. Type 2 diabetes mellitus with hyperlipidemia (HCC) Last 2 A1c's have been 6.0.  This time will stop her insulin and follow-up A1c in 3 months. - POCT HgB A1C  2. Acute on chronic heart failure with preserved ejection fraction (HFpEF)  (HCC) Clinically improved.  Followed by cardiology.  3. Essential hypertension Fairly good control with blood pressure today of 148/55.  I am not sure she would tolerate a diastolic lower than this at this time.  4. Chronic kidney disease (CKD), stage V [facility]) Per nephrology.  5. Anemia in stage 5 chronic kidney disease, not on chronic dialysis ([place]) CBC on next visit if  not done by nephrology.     Marycarmen Hagey Cran[place] Mon, MD  [facility]

## 2019-07-23 ENCOUNTER — Other Ambulatory Visit: Payer: Self-pay

## 2019-07-23 ENCOUNTER — Encounter: Payer: Self-pay | Admitting: Family Medicine

## 2019-07-23 ENCOUNTER — Ambulatory Visit (INDEPENDENT_AMBULATORY_CARE_PROVIDER_SITE_OTHER): Payer: Medicare Other | Admitting: Family Medicine

## 2019-07-23 VITALS — BP 148/55 | HR 72 | Temp 97.3°F | Wt 138.0 lb

## 2019-07-23 DIAGNOSIS — E1169 Type 2 diabetes mellitus with other specified complication: Secondary | ICD-10-CM | POA: Diagnosis not present

## 2019-07-23 DIAGNOSIS — I5033 Acute on chronic diastolic (congestive) heart failure: Secondary | ICD-10-CM | POA: Diagnosis not present

## 2019-07-23 DIAGNOSIS — D631 Anemia in chronic kidney disease: Secondary | ICD-10-CM

## 2019-07-23 DIAGNOSIS — E785 Hyperlipidemia, unspecified: Secondary | ICD-10-CM

## 2019-07-23 DIAGNOSIS — N185 Chronic kidney disease, stage 5: Secondary | ICD-10-CM | POA: Diagnosis not present

## 2019-07-23 DIAGNOSIS — I10 Essential (primary) hypertension: Secondary | ICD-10-CM | POA: Diagnosis not present

## 2019-07-23 LAB — POCT GLYCOSYLATED HEMOGLOBIN (HGB A1C)
Estimated Average Glucose: 126
Hemoglobin A1C: 6 % — AB (ref 4.0–5.6)

## 2019-07-23 MED ORDER — CARVEDILOL 12.5 MG PO TABS: 25.0000 mg | ORAL_TABLET | Freq: Two times a day (BID) | ORAL | 1 refills | Status: DC

## 2019-07-23 NOTE — Patient Instructions (Signed)
Stop Insulin!!

## 2019-07-24 DIAGNOSIS — N2581 Secondary hyperparathyroidism of renal origin: Secondary | ICD-10-CM | POA: Diagnosis not present

## 2019-07-24 DIAGNOSIS — E1122 Type 2 diabetes mellitus with diabetic chronic kidney disease: Secondary | ICD-10-CM | POA: Diagnosis not present

## 2019-07-24 DIAGNOSIS — D631 Anemia in chronic kidney disease: Secondary | ICD-10-CM | POA: Diagnosis not present

## 2019-07-24 DIAGNOSIS — R809 Proteinuria, unspecified: Secondary | ICD-10-CM | POA: Diagnosis not present

## 2019-07-24 DIAGNOSIS — N185 Chronic kidney disease, stage 5: Secondary | ICD-10-CM | POA: Diagnosis not present

## 2019-07-29 ENCOUNTER — Other Ambulatory Visit: Payer: Self-pay | Admitting: Family Medicine

## 2019-07-29 DIAGNOSIS — N185 Chronic kidney disease, stage 5: Secondary | ICD-10-CM

## 2019-07-29 DIAGNOSIS — I10 Essential (primary) hypertension: Secondary | ICD-10-CM

## 2019-08-01 DIAGNOSIS — J449 Chronic obstructive pulmonary disease, unspecified: Secondary | ICD-10-CM | POA: Diagnosis not present

## 2019-08-01 DIAGNOSIS — I509 Heart failure, unspecified: Secondary | ICD-10-CM | POA: Diagnosis not present

## 2019-08-01 DIAGNOSIS — R062 Wheezing: Secondary | ICD-10-CM | POA: Diagnosis not present

## 2019-08-12 ENCOUNTER — Encounter (INDEPENDENT_AMBULATORY_CARE_PROVIDER_SITE_OTHER): Payer: Medicare Other | Admitting: Vascular Surgery

## 2019-08-14 ENCOUNTER — Inpatient Hospital Stay: Payer: Medicare Other | Admitting: Internal Medicine

## 2019-08-14 ENCOUNTER — Inpatient Hospital Stay: Payer: Medicare Other

## 2019-08-14 DIAGNOSIS — I252 Old myocardial infarction: Secondary | ICD-10-CM | POA: Insufficient documentation

## 2019-08-14 DIAGNOSIS — D631 Anemia in chronic kidney disease: Secondary | ICD-10-CM | POA: Insufficient documentation

## 2019-08-14 DIAGNOSIS — Z885 Allergy status to narcotic agent status: Secondary | ICD-10-CM | POA: Insufficient documentation

## 2019-08-14 DIAGNOSIS — I130 Hypertensive heart and chronic kidney disease with heart failure and stage 1 through stage 4 chronic kidney disease, or unspecified chronic kidney disease: Secondary | ICD-10-CM | POA: Insufficient documentation

## 2019-08-14 DIAGNOSIS — R0602 Shortness of breath: Secondary | ICD-10-CM | POA: Insufficient documentation

## 2019-08-14 DIAGNOSIS — I700 Atherosclerosis of aorta: Secondary | ICD-10-CM | POA: Insufficient documentation

## 2019-08-14 DIAGNOSIS — M549 Dorsalgia, unspecified: Secondary | ICD-10-CM | POA: Insufficient documentation

## 2019-08-14 DIAGNOSIS — N185 Chronic kidney disease, stage 5: Secondary | ICD-10-CM | POA: Diagnosis not present

## 2019-08-14 DIAGNOSIS — Z8249 Family history of ischemic heart disease and other diseases of the circulatory system: Secondary | ICD-10-CM | POA: Insufficient documentation

## 2019-08-14 DIAGNOSIS — R809 Proteinuria, unspecified: Secondary | ICD-10-CM | POA: Diagnosis not present

## 2019-08-14 DIAGNOSIS — N184 Chronic kidney disease, stage 4 (severe): Secondary | ICD-10-CM | POA: Insufficient documentation

## 2019-08-14 DIAGNOSIS — R5383 Other fatigue: Secondary | ICD-10-CM | POA: Insufficient documentation

## 2019-08-14 DIAGNOSIS — Z79899 Other long term (current) drug therapy: Secondary | ICD-10-CM | POA: Insufficient documentation

## 2019-08-14 DIAGNOSIS — I509 Heart failure, unspecified: Secondary | ICD-10-CM | POA: Insufficient documentation

## 2019-08-14 DIAGNOSIS — E1122 Type 2 diabetes mellitus with diabetic chronic kidney disease: Secondary | ICD-10-CM | POA: Diagnosis not present

## 2019-08-14 DIAGNOSIS — R609 Edema, unspecified: Secondary | ICD-10-CM | POA: Insufficient documentation

## 2019-08-14 DIAGNOSIS — Z809 Family history of malignant neoplasm, unspecified: Secondary | ICD-10-CM | POA: Insufficient documentation

## 2019-08-14 DIAGNOSIS — Z87891 Personal history of nicotine dependence: Secondary | ICD-10-CM | POA: Insufficient documentation

## 2019-08-14 DIAGNOSIS — N2581 Secondary hyperparathyroidism of renal origin: Secondary | ICD-10-CM | POA: Diagnosis not present

## 2019-08-14 DIAGNOSIS — E785 Hyperlipidemia, unspecified: Secondary | ICD-10-CM | POA: Insufficient documentation

## 2019-08-14 DIAGNOSIS — M2550 Pain in unspecified joint: Secondary | ICD-10-CM | POA: Insufficient documentation

## 2019-08-14 DIAGNOSIS — R8290 Unspecified abnormal findings in urine: Secondary | ICD-10-CM | POA: Diagnosis not present

## 2019-08-16 DIAGNOSIS — I5022 Chronic systolic (congestive) heart failure: Secondary | ICD-10-CM | POA: Diagnosis not present

## 2019-08-19 ENCOUNTER — Encounter (INDEPENDENT_AMBULATORY_CARE_PROVIDER_SITE_OTHER): Payer: Medicare Other | Admitting: Vascular Surgery

## 2019-08-21 ENCOUNTER — Other Ambulatory Visit: Payer: Medicare Other

## 2019-08-21 ENCOUNTER — Encounter: Payer: Medicare Other | Admitting: Internal Medicine

## 2019-08-26 ENCOUNTER — Ambulatory Visit (INDEPENDENT_AMBULATORY_CARE_PROVIDER_SITE_OTHER): Payer: Medicare Other | Admitting: Vascular Surgery

## 2019-08-26 ENCOUNTER — Other Ambulatory Visit: Payer: Self-pay

## 2019-08-26 ENCOUNTER — Encounter (INDEPENDENT_AMBULATORY_CARE_PROVIDER_SITE_OTHER): Payer: Self-pay | Admitting: Vascular Surgery

## 2019-08-26 VITALS — BP 142/56 | HR 78 | Resp 16 | Ht 63.0 in | Wt 146.0 lb

## 2019-08-26 DIAGNOSIS — I5033 Acute on chronic diastolic (congestive) heart failure: Secondary | ICD-10-CM | POA: Diagnosis not present

## 2019-08-26 DIAGNOSIS — E785 Hyperlipidemia, unspecified: Secondary | ICD-10-CM | POA: Diagnosis not present

## 2019-08-26 DIAGNOSIS — I10 Essential (primary) hypertension: Secondary | ICD-10-CM | POA: Diagnosis not present

## 2019-08-26 DIAGNOSIS — N185 Chronic kidney disease, stage 5: Secondary | ICD-10-CM | POA: Diagnosis not present

## 2019-08-26 NOTE — Progress Notes (Signed)
MRN : [id number]  Lori Stanley is a 86 y.o. (10/01/1931) female who presents with chief complaint of  Chief Complaint  Patient presents with  . New Patient (Initial Visit)    ref Maia Breslow discuss cath removal  .  History of Present Illness:    The patient is seen for evaluation of dialysis access.  The patient has a history of multiple failed accesses.  There have been accesses in both arms and in the thighs.    Current access is via a catheter which is functioning poorly.  There have been several episodes of catheter infection.  The patient denies amaurosis fugax or recent TIA symptoms. There are no recent neurological changes noted. The patient denies claudication symptoms or rest pain symptoms. The patient denies history of DVT, PE or superficial thrombophlebitis. The patient denies recent episodes of angina or shortness of breath.    Current Meds  Medication Sig  . acetaminophen (TYLENOL) 500 MG tablet Take 1,000 mg by mouth every 6 (six) hours as needed.  [name] albuterol (VENTOLIN HFA) 108 (90 Base) MCG/ACT inhaler Inhale 2 puffs into the lungs every 6 (six) hours as needed for wheezing or shortness of breath.  [name] amLODipine (NORVASC) 5 MG tablet Take 1 tablet (5 mg total) by mouth daily.  . ASPIRIN LOW DOSE 81 MG EC tablet TAKE 1 TABLET (81 MG TOTAL) BY MOUTH DAILY. (Patient taking differently: Take 81 mg by mouth daily. )  . Bepotastine Besilate (BEPREVE) 1.5 % SOLN Place 1 drop into both eyes daily.  . Blood Glucose Monitoring Suppl (ONE TOUCH ULTRA MINI) w/Device KIT 1 kit by Does not apply route daily.  . brimonidine (ALPHAGAN) 0.2 % ophthalmic solution 1 drop 2 (two) times daily. To affected eye(s)  . calcitRIOL (ROCALTROL) 0.25 MCG capsule Take 0.25 mcg by mouth every Monday, Wednesday, and Friday.   . carvedilol (COREG) 12.5 MG tablet TAKE 2 TABLETS (25 MG TOTAL) BY MOUTH 2 (TWO) TIMES DAILY.  [name] dorzolamide-timolol (COSOPT) 22.3-6.8 MG/ML ophthalmic solution Place 1  drop into both eyes 2 (two) times daily.   . Insulin Pen Needle (B-D UF III MINI PEN NEEDLES) 31G X 5 MM MISC USE TWICE A DAY AS DIRECTED  . latanoprost (XALATAN) 0.005 % ophthalmic solution Place 1 drop into the left eye at bedtime.   [name] loratadine (CLARITIN) 10 MG tablet Take 1 tablet (10 mg total) by mouth daily.  Glory Rosebush Delica Lancets 09T MISC USE AS DIRECTED  . ONETOUCH ULTRA test strip USE AS DIRECTED  . sertraline (ZOLOFT) 25 MG tablet TAKE 1 TABLET BY MOUTH EVERY DAY  . telmisartan (MICARDIS) 20 MG tablet TAKE 1 TABLET BY MOUTH EVERY DAY (Patient taking differently: Take 40 mg by mouth daily. )  . torsemide (DEMADEX) 20 MG tablet TAKE 2 TABLETS BY MOUTH EVERY DAY  . traZODone (DESYREL) 150 MG tablet Take 150 mg by mouth at bedtime.  . Vitamin D, Ergocalciferol, (DRISDOL) 50000 units CAPS capsule Take 50,000 Units by mouth every 7 (seven) days.    Past Medical History:  Diagnosis Date  . Acute heart failure ([place])   . CHF (congestive heart failure) ([place])   . Chronic kidney disease   . Chronic low back pain   . Diabetes mellitus without complication ([place])   . Hyperlipidemia   . Hypertension   . MI (myocardial infarction) ([place])   . Obesity     Past Surgical History:  Procedure Laterality Date  . DIALYSIS/PERMA CATHETER INSERTION N/A 06/07/2019  Procedure: DIALYSIS/PERMA CATHETER INSERTION;  Surgeon: Katha Cabal, MD;  Location: [facility];  Service: Cardiovascular;  Laterality: N/A;  . EYE SURGERY    . KNEE SURGERY    . THYROID SURGERY    . VAGINAL HYSTERECTOMY      Social History Social History   Tobacco Use  . Smoking status: Former Smoker    Types: Cigarettes  . Smokeless tobacco: Never Used  . Tobacco comment: Quit in 2015-2016  Substance Use Topics  . Alcohol use: No  . Drug use: No    Family History Family History  Problem Relation Age of Onset  . Heart attack Mother   . Heart disease Sister   . Cancer Sister     Allergies    Allergen Reactions  . Antihistamines, Chlorpheniramine-Type   . Codeine   . Paxil [Paroxetine]     "makes her feel funny" not in a good way     REVIEW OF SYSTEMS (Negative unless checked)  Constitutional: [] Weight loss  [] Fever  [] Chills Cardiac: [] Chest pain   [] Chest pressure   [] Palpitations   [] Shortness of breath when laying flat   [] Shortness of breath with exertion. Vascular:  [] Pain in legs with walking   [] Pain in legs at rest  [] History of DVT   [] Phlebitis   [] Swelling in legs   [] Varicose veins   [] Non-healing ulcers Pulmonary:   [] Uses home oxygen   [] Productive cough   [] Hemoptysis   [] Wheeze  [] COPD   [] Asthma Neurologic:  [] Dizziness   [] Seizures   [] History of stroke   [] History of TIA  [] Aphasia   [] Vissual changes   [] Weakness or numbness in arm   [] Weakness or numbness in leg Musculoskeletal:   [] Joint swelling   [] Joint pain   [] Low back pain Hematologic:  [] Easy bruising  [] Easy bleeding   [] Hypercoagulable state   [] Anemic Gastrointestinal:  [] Diarrhea   [] Vomiting  [] Gastroesophageal reflux/heartburn   [] Difficulty swallowing. Genitourinary:  [x] Chronic kidney disease   [] Difficult urination  [] Frequent urination   [] Blood in urine Skin:  [] Rashes   [] Ulcers  Psychological:  [] History of anxiety   []  History of major depression.  Physical Examination  Vitals:   08/26/19 0826  BP: (!) 142/56  Pulse: 78  Resp: 16  Weight: 146 lb (66.2 kg)  Height: 5' 3"  (1.6 m)   Body mass index is 25.86 kg/m. Gen: WD/WN, NAD Head: [place]/AT, No temporalis wasting.  Ear/Nose/Throat: Hearing grossly intact, nares w/o erythema or drainage Eyes: PER, EOMI, sclera nonicteric.  Neck: Supple, no large masses.   Pulmonary:  Good air movement, no audible wheezing bilaterally, no use of accessory muscles.  Cardiac: RRR, no JVD +SEM Vascular: catheter CD&I Vessel Right Left  Radial Palpable Palpable  Gastrointestinal: Non-distended. No guarding/no peritoneal signs.   Musculoskeletal: M/S 5/5 throughout.  No deformity or atrophy.  Neurologic: CN 2-12 intact. Symmetrical.  Speech is fluent. Motor exam as listed above. Psychiatric: Judgment intact, Mood & affect appropriate for pt's clinical situation.   CBC Lab Results  Component Value Date   WBC 6.7 06/16/2019   HGB 7.2 (L) 06/16/2019   HCT 23.4 (L) 06/16/2019   MCV 87.0 06/16/2019   PLT 259 06/16/2019    BMET    Component Value Date/Time   NA 139 06/17/2019 0505   NA 143 04/10/2019 1353   NA 132 (L) 05/15/2013 0512   K 3.7 06/17/2019 0505   K 4.0 05/15/2013 0512   CL 102 06/17/2019 0505  CL 99 05/15/2013 0512   CO2 30 06/17/2019 0505   CO2 28 05/15/2013 0512   GLUCOSE 119 (H) 06/17/2019 0505   GLUCOSE 95 05/15/2013 0512   BUN 21 06/17/2019 0505   BUN 40 (H) 04/10/2019 1353   BUN 80 (H) 05/15/2013 0512   CREATININE 2.81 (H) 06/17/2019 0505   CREATININE 2.27 (H) 05/15/2013 0512   CALCIUM 9.4 06/17/2019 0505   CALCIUM 8.0 (L) 05/15/2013 0512   GFRNONAA 15 (L) 06/17/2019 0505   GFRNONAA 20 (L) 05/15/2013 0512   GFRAA 17 (L) 06/17/2019 0505   GFRAA 23 (L) 05/15/2013 0512   CrCl cannot be calculated (Patient's most recent lab result is older than the maximum 21 days allowed.).  COAG Lab Results  Component Value Date   INR 1.2 06/07/2019    Radiology No results found.   Assessment/Plan 1. Chronic kidney disease (CKD), stage V ([place]) The patient's acute on chronic renal dysfunction has improved.  They have not been using her catheter.  Therefore the catheter should be removed.  [facility] confirm this.  Risk and benefits for catheter removal were discussed with the patient all questions have been answered.  We will set up catheter removal as an outpatient.  2. Essential hypertension Continue antihypertensive medications as already ordered, these medications have been reviewed and there are no changes at this time.   3. Acute on chronic diastolic CHF  (congestive heart failure) (HCC) Continue cardiac and antihypertensive medications as already ordered and reviewed, no changes at this time.  Continue statin as ordered and reviewed, no changes at this time  Nitrates PRN for chest pain   4. Hyperlipidemia, unspecified hyperlipidemia type Continue statin as ordered and reviewed, no changes at this time     [name], MD  08/26/2019 8:36 AM

## 2019-08-27 ENCOUNTER — Telehealth: Payer: Self-pay | Admitting: *Deleted

## 2019-08-27 ENCOUNTER — Other Ambulatory Visit: Payer: Self-pay | Admitting: *Deleted

## 2019-08-27 ENCOUNTER — Encounter: Payer: Self-pay | Admitting: Internal Medicine

## 2019-08-27 ENCOUNTER — Inpatient Hospital Stay (HOSPITAL_BASED_OUTPATIENT_CLINIC_OR_DEPARTMENT_OTHER): Payer: Medicare Other | Admitting: Internal Medicine

## 2019-08-27 ENCOUNTER — Inpatient Hospital Stay: Payer: Medicare Other

## 2019-08-27 ENCOUNTER — Telehealth (INDEPENDENT_AMBULATORY_CARE_PROVIDER_SITE_OTHER): Payer: Self-pay

## 2019-08-27 DIAGNOSIS — I120 Hypertensive chronic kidney disease with stage 5 chronic kidney disease or end stage renal disease: Secondary | ICD-10-CM | POA: Diagnosis not present

## 2019-08-27 DIAGNOSIS — I700 Atherosclerosis of aorta: Secondary | ICD-10-CM

## 2019-08-27 DIAGNOSIS — M545 Low back pain: Secondary | ICD-10-CM | POA: Diagnosis not present

## 2019-08-27 DIAGNOSIS — Z8249 Family history of ischemic heart disease and other diseases of the circulatory system: Secondary | ICD-10-CM

## 2019-08-27 DIAGNOSIS — Z87891 Personal history of nicotine dependence: Secondary | ICD-10-CM | POA: Diagnosis not present

## 2019-08-27 DIAGNOSIS — N184 Chronic kidney disease, stage 4 (severe): Secondary | ICD-10-CM

## 2019-08-27 DIAGNOSIS — J9621 Acute and chronic respiratory failure with hypoxia: Secondary | ICD-10-CM | POA: Diagnosis not present

## 2019-08-27 DIAGNOSIS — R809 Proteinuria, unspecified: Secondary | ICD-10-CM | POA: Diagnosis not present

## 2019-08-27 DIAGNOSIS — E8779 Other fluid overload: Secondary | ICD-10-CM | POA: Diagnosis not present

## 2019-08-27 DIAGNOSIS — D631 Anemia in chronic kidney disease: Secondary | ICD-10-CM

## 2019-08-27 DIAGNOSIS — M2550 Pain in unspecified joint: Secondary | ICD-10-CM

## 2019-08-27 DIAGNOSIS — E1165 Type 2 diabetes mellitus with hyperglycemia: Secondary | ICD-10-CM | POA: Diagnosis not present

## 2019-08-27 DIAGNOSIS — Z9981 Dependence on supplemental oxygen: Secondary | ICD-10-CM | POA: Diagnosis not present

## 2019-08-27 DIAGNOSIS — D638 Anemia in other chronic diseases classified elsewhere: Secondary | ICD-10-CM | POA: Diagnosis not present

## 2019-08-27 DIAGNOSIS — Z7982 Long term (current) use of aspirin: Secondary | ICD-10-CM | POA: Diagnosis not present

## 2019-08-27 DIAGNOSIS — Z20822 Contact with and (suspected) exposure to covid-19: Secondary | ICD-10-CM | POA: Diagnosis not present

## 2019-08-27 DIAGNOSIS — G4700 Insomnia, unspecified: Secondary | ICD-10-CM | POA: Diagnosis not present

## 2019-08-27 DIAGNOSIS — Z809 Family history of malignant neoplasm, unspecified: Secondary | ICD-10-CM

## 2019-08-27 DIAGNOSIS — I132 Hypertensive heart and chronic kidney disease with heart failure and with stage 5 chronic kidney disease, or end stage renal disease: Secondary | ICD-10-CM | POA: Diagnosis not present

## 2019-08-27 DIAGNOSIS — I509 Heart failure, unspecified: Secondary | ICD-10-CM

## 2019-08-27 DIAGNOSIS — R609 Edema, unspecified: Secondary | ICD-10-CM

## 2019-08-27 DIAGNOSIS — I252 Old myocardial infarction: Secondary | ICD-10-CM | POA: Diagnosis not present

## 2019-08-27 DIAGNOSIS — N2581 Secondary hyperparathyroidism of renal origin: Secondary | ICD-10-CM | POA: Diagnosis not present

## 2019-08-27 DIAGNOSIS — Z79899 Other long term (current) drug therapy: Secondary | ICD-10-CM

## 2019-08-27 DIAGNOSIS — N179 Acute kidney failure, unspecified: Secondary | ICD-10-CM | POA: Diagnosis not present

## 2019-08-27 DIAGNOSIS — I130 Hypertensive heart and chronic kidney disease with heart failure and stage 1 through stage 4 chronic kidney disease, or unspecified chronic kidney disease: Secondary | ICD-10-CM

## 2019-08-27 DIAGNOSIS — I10 Essential (primary) hypertension: Secondary | ICD-10-CM | POA: Diagnosis not present

## 2019-08-27 DIAGNOSIS — E1122 Type 2 diabetes mellitus with diabetic chronic kidney disease: Secondary | ICD-10-CM | POA: Diagnosis not present

## 2019-08-27 DIAGNOSIS — Z885 Allergy status to narcotic agent status: Secondary | ICD-10-CM

## 2019-08-27 DIAGNOSIS — R600 Localized edema: Secondary | ICD-10-CM | POA: Diagnosis not present

## 2019-08-27 DIAGNOSIS — Z992 Dependence on renal dialysis: Secondary | ICD-10-CM | POA: Diagnosis not present

## 2019-08-27 DIAGNOSIS — I5043 Acute on chronic combined systolic (congestive) and diastolic (congestive) heart failure: Secondary | ICD-10-CM | POA: Diagnosis not present

## 2019-08-27 DIAGNOSIS — R0602 Shortness of breath: Secondary | ICD-10-CM

## 2019-08-27 DIAGNOSIS — E1169 Type 2 diabetes mellitus with other specified complication: Secondary | ICD-10-CM | POA: Diagnosis not present

## 2019-08-27 DIAGNOSIS — E785 Hyperlipidemia, unspecified: Secondary | ICD-10-CM

## 2019-08-27 DIAGNOSIS — N185 Chronic kidney disease, stage 5: Secondary | ICD-10-CM | POA: Diagnosis not present

## 2019-08-27 DIAGNOSIS — G8929 Other chronic pain: Secondary | ICD-10-CM | POA: Diagnosis not present

## 2019-08-27 DIAGNOSIS — Z888 Allergy status to other drugs, medicaments and biological substances status: Secondary | ICD-10-CM | POA: Diagnosis not present

## 2019-08-27 DIAGNOSIS — M549 Dorsalgia, unspecified: Secondary | ICD-10-CM

## 2019-08-27 DIAGNOSIS — N186 End stage renal disease: Secondary | ICD-10-CM | POA: Diagnosis not present

## 2019-08-27 DIAGNOSIS — R5383 Other fatigue: Secondary | ICD-10-CM

## 2019-08-27 HISTORY — DX: Anemia in chronic kidney disease: N18.4

## 2019-08-27 HISTORY — DX: Anemia in chronic kidney disease: D63.1

## 2019-08-27 LAB — CBC WITH DIFFERENTIAL/PLATELET
Abs Immature Granulocytes: 0.03 10*3/uL (ref 0.00–0.07)
Basophils Absolute: 0 10*3/uL (ref 0.0–0.1)
Basophils Relative: 0 %
Eosinophils Absolute: 0.1 10*3/uL (ref 0.0–0.5)
Eosinophils Relative: 1 %
HCT: 22.8 % — ABNORMAL LOW (ref 36.0–46.0)
Hemoglobin: 6.9 g/dL — ABNORMAL LOW (ref 12.0–15.0)
Immature Granulocytes: 1 %
Lymphocytes Relative: 14 %
Lymphs Abs: 0.7 10*3/uL (ref 0.7–4.0)
MCH: 26 pg (ref 26.0–34.0)
MCHC: 30.3 g/dL (ref 30.0–36.0)
MCV: 86 fL (ref 80.0–100.0)
Monocytes Absolute: 0.3 10*3/uL (ref 0.1–1.0)
Monocytes Relative: 6 %
Neutro Abs: 4.2 10*3/uL (ref 1.7–7.7)
Neutrophils Relative %: 78 %
Platelets: 202 10*3/uL (ref 150–400)
RBC: 2.65 MIL/uL — ABNORMAL LOW (ref 3.87–5.11)
RDW: 16.5 % — ABNORMAL HIGH (ref 11.5–15.5)
WBC: 5.3 10*3/uL (ref 4.0–10.5)
nRBC: 0 % (ref 0.0–0.2)

## 2019-08-27 LAB — IRON AND TIBC
Iron: 38 ug/dL (ref 28–170)
Saturation Ratios: 12 % (ref 10.4–31.8)
TIBC: 314 ug/dL (ref 250–450)
UIBC: 276 ug/dL

## 2019-08-27 LAB — COMPREHENSIVE METABOLIC PANEL
ALT: 16 U/L (ref 0–44)
AST: 14 U/L — ABNORMAL LOW (ref 15–41)
Albumin: 3.5 g/dL (ref 3.5–5.0)
Alkaline Phosphatase: 63 U/L (ref 38–126)
Anion gap: 11 (ref 5–15)
BUN: 81 mg/dL — ABNORMAL HIGH (ref 8–23)
CO2: 24 mmol/L (ref 22–32)
Calcium: 9.1 mg/dL (ref 8.9–10.3)
Chloride: 107 mmol/L (ref 98–111)
Creatinine, Ser: 4.14 mg/dL — ABNORMAL HIGH (ref 0.44–1.00)
GFR calc Af Amer: 11 mL/min — ABNORMAL LOW (ref >60)
GFR calc non Af Amer: 9 mL/min — ABNORMAL LOW (ref >60)
Glucose, Bld: 137 mg/dL — ABNORMAL HIGH (ref 70–99)
Potassium: 3.7 mmol/L (ref 3.5–5.1)
Sodium: 142 mmol/L (ref 135–145)
Total Bilirubin: 0.5 mg/dL (ref 0.3–1.2)
Total Protein: 6.2 g/dL — ABNORMAL LOW (ref 6.5–8.1)

## 2019-08-27 LAB — RETICULOCYTES
Immature Retic Fract: 19.1 % — ABNORMAL HIGH (ref 2.3–15.9)
RBC.: 2.72 MIL/uL — ABNORMAL LOW (ref 3.87–5.11)
Retic Count, Absolute: 66.9 10*3/uL (ref 19.0–186.0)
Retic Ct Pct: 2.5 % (ref 0.4–3.1)

## 2019-08-27 LAB — C-REACTIVE PROTEIN: CRP: 1.6 mg/dL — ABNORMAL HIGH (ref <1.0)

## 2019-08-27 LAB — LACTATE DEHYDROGENASE: LDH: 141 U/L (ref 98–192)

## 2019-08-27 NOTE — Telephone Encounter (Signed)
Patient agreeable to blood, but needs to be formally consented by provider. Dr. Rogue Bussing - patient will be here at 8:30 am tom. Please speak to patient re: risk factors for blood infusion.

## 2019-08-27 NOTE — Assessment & Plan Note (Addendum)
#  Anemia/secondary stage IV kidney disease-iron deficiency.  Recommend IV Venofer weekly x4.  Start first infusion next week.  Discussed the potential acute infusion reactions with IV iron; which are quite rare.  Patient understands the risk; will proceed with infusions.  Discussed use of erythropoietin stimulating agents like retacrit to stimulate the bone marrow.  Discussed the potential issues with erythropoietin estimating agents-given the risk of stroke thromboembolic events/elevated blood pressure.  However, most of the serious events did not happen when the goal hematocrit is 33/hemoglobin 30.  I also discussed the use of IV iron infusion; and the potential infusion reactions.  #Chronic kidney disease stage IV-currently on dialysis.  We will repeat labs today.  Thank you, for allowing me to participate in the care of your pleasant patient. Please do not hesitate to contact me with questions or concerns in the interim.   # DISPOSITION: # labs today [ordered] # venofer weekly x4- start next week # in 2 weeks- MD; labs- cbc; Possible venofer; or RETACRIT- Dr.B  Addendum: Hemoglobin 6.9.  Patient is quite symptomatic.  Discussed with the patient in agreement with PRBC transfusion. Repeat GFR ~9; discussed with Dr.Kolluru; patient will likely need to be initiated on HD again.   #Discussed with the patient's niece regarding our recommendations.  Also recommend follow-up with Dr. Juleen China.

## 2019-08-27 NOTE — Progress Notes (Signed)
[facility] CONSULT NOTE  Patient Care Team: Jerrol Banana., MD as PCP - General (Family Medicine) Wellington Hampshire, MD as PCP - Cardiology (Cardiology) Sharlotte [place], DPM (Podiatry) Hayden Pedro, MD as Consulting Physician (Ophthalmology) Lavonia Dana, MD as Consulting Physician (Internal Medicine) Odette Fraction (Optometry) Cathi Roan, [facility] (Pharmacist) Alisa Graff, FNP (Family Medicine) Cammie Sickle, MD as Consulting Physician (Internal Medicine)  CHIEF COMPLAINTS/PURPOSE OF CONSULTATION:    HEMATOLOGY HISTORY  # ANEMIA EGD-; colonoscopy?  # CKD stage IV [Dr.Kolluru] s/p HD [appx off since 1 month]  HISTORY OF PRESENTING ILLNESS:  Lori Stanley 86 y.o.  female has been referred to [place] for further evaluation recommendations for anemia.  Patient history of acute on chronic kidney disease-for which she had been on dialysis.  However approximately 1 month ago she was taken off dialysis as her renal function was stable.  Patient complains of worsening shortness of breath over the last few weeks.  Complains of mild to moderate swelling of the legs.  Blood in stools: None Change in bowel habits- None Blood in urine: None Difficulty swallowing: None Abnormal weight loss: None Iron supplementation:none Prior Blood transfusions: PRBC transfusion once in the hospital. Vaginal bleeding: None   Review of Systems  Constitutional: Positive for malaise/fatigue. Negative for chills, diaphoresis, fever and weight loss.  HENT: Negative for nosebleeds and sore throat.   Eyes: Negative for double vision.  Respiratory: Positive for shortness of breath. Negative for cough, hemoptysis, sputum production and wheezing.   Cardiovascular: Negative for chest pain, palpitations, orthopnea and leg swelling.  Gastrointestinal: Negative for abdominal pain, blood in stool, constipation, diarrhea, heartburn, melena, nausea and vomiting.   Genitourinary: Negative for dysuria, frequency and urgency.  Musculoskeletal: Positive for back pain and joint pain.  Skin: Negative.  Negative for itching and rash.  Neurological: Negative for dizziness, tingling, focal weakness, weakness and headaches.  Endo/Heme/Allergies: Does not bruise/bleed easily.  Psychiatric/Behavioral: Negative for depression. The patient is not nervous/anxious and does not have insomnia.     MEDICAL HISTORY:  Past Medical History:  Diagnosis Date  . Acute heart failure ([place])   . CHF (congestive heart failure) ([place])   . Chronic kidney disease   . Chronic low back pain   . Diabetes mellitus without complication ([place])   . Hyperlipidemia   . Hypertension   . MI (myocardial infarction) ([place])   . Obesity     SURGICAL HISTORY: Past Surgical History:  Procedure Laterality Date  . DIALYSIS/PERMA CATHETER INSERTION N/A 06/07/2019   Procedure: DIALYSIS/PERMA CATHETER INSERTION;  Surgeon: Katha Cabal, MD;  Location: [facility];  Service: Cardiovascular;  Laterality: N/A;  . EYE SURGERY    . KNEE SURGERY    . THYROID SURGERY    . VAGINAL HYSTERECTOMY      SOCIAL HISTORY: Social History   Socioeconomic History  . Marital status: Widowed    Spouse name: Not on file  . Number of children: 1  . Years of education: Not on file  . Highest education level: 11th grade  Occupational History  . Occupation: retired  Tobacco Use  . Smoking status: Former Smoker    Types: Cigarettes  . Smokeless tobacco: Never Used  . Tobacco comment: Quit in 2015-2016  Substance and Sexual Activity  . Alcohol use: No  . Drug use: No  . Sexual activity: Never  Other Topics Concern  . Not on file  Social History Narrative  Lives in [place]; self; friends does chores; quit smoking many years; no alcohol. Worked in the [employer].    Social Determinants of Health   Financial Resource Strain: Low Risk   . Difficulty of Paying Living Expenses: Not hard at all   Food Insecurity: No Food Insecurity  . Worried About Charity fundraiser in the Last Year: Never true  . Ran Out of Food in the Last Year: Never true  Transportation Needs: No Transportation Needs  . Lack of Transportation (Medical): No  . Lack of Transportation (Non-Medical): No  Physical Activity: Inactive  . Days of Exercise per Week: 0 days  . Minutes of Exercise per Session: 0 min  Stress: No Stress Concern Present  . Feeling of Stress : Only a little  Social Connections: Somewhat Isolated  . Frequency of Communication with Friends and Family: More than three times a week  . Frequency of Social Gatherings with Friends and Family: Once a week  . Attends Religious Services: More than 4 times per year  . Active Member of Clubs or Organizations: No  . Attends Archivist Meetings: Never  . Marital Status: Widowed  Intimate Partner Violence: Not At Risk  . Fear of Current or Ex-Partner: No  . Emotionally Abused: No  . Physically Abused: No  . Sexually Abused: No    FAMILY HISTORY: Family History  Problem Relation Age of Onset  . Heart attack Mother   . Heart disease Sister   . Cancer Sister     ALLERGIES:  is allergic to antihistamines, chlorpheniramine-type; codeine; and paxil [paroxetine].  MEDICATIONS:  No current facility-administered medications for this visit.   No current outpatient medications on file.   Facility-Administered Medications Ordered in Other Visits  Medication Dose Route Frequency Provider Last Rate Last Admin  . 0.9 %  sodium chloride infusion  250 mL Intravenous PRN Wouk, Ailene Rud, MD      . amLODipine (NORVASC) tablet 5 mg  5 mg Oral Daily Gwynne Edinger, MD   5 mg at 09/01/19 [phone]  . aspirin EC tablet 81 mg  81 mg Oral Daily Gwynne Edinger, MD   81 mg at 09/01/19 [phone]  . brimonidine (ALPHAGAN) 0.2 % ophthalmic solution 1 drop  1 drop Both Eyes BID Gwynne Edinger, MD   1 drop at 09/01/19 2219  . carvedilol (COREG) tablet  25 mg  25 mg Oral BID Gwynne Edinger, MD   25 mg at 09/01/19 2218  . Chlorhexidine Gluconate Cloth 2 % PADS 6 each  6 each Topical Q0600 Gwynne Edinger, MD   6 each at 08/31/19 [phone]  . dorzolamide-timolol (COSOPT) 22.3-6.8 MG/ML ophthalmic solution 1 drop  1 drop Both Eyes BID Gwynne Edinger, MD   1 drop at 09/01/19 2221  . furosemide (LASIX) injection 80 mg  80 mg Intravenous Q12H Gwynne Edinger, MD   80 mg at 09/01/19 1748  . heparin injection 5,000 Units  5,000 Units Subcutaneous Q8H Gwynne Edinger, MD   5,000 Units at 09/01/19 2218  . insulin aspart (novoLOG) injection 0-6 Units  0-6 Units Subcutaneous TID WC Jennye Boroughs, MD   1 Units at 08/31/19 1704  . irbesartan (AVAPRO) tablet 75 mg  75 mg Oral Daily Gwynne Edinger, MD   75 mg at 09/01/19 4628  . latanoprost (XALATAN) 0.005 % ophthalmic solution 1 drop  1 drop Left Eye QHS Wouk, Ailene Rud, MD   1 drop at 09/01/19 2224  .  loratadine (CLARITIN) tablet 10 mg  10 mg Oral Daily Gwynne Edinger, MD   10 mg at 09/01/19 [phone]  . olopatadine (PATANOL) 0.1 % ophthalmic solution 1 drop  1 drop Both Eyes BID Gwynne Edinger, MD   1 drop at 09/01/19 2227  . sertraline (ZOLOFT) tablet 25 mg  25 mg Oral Daily Gwynne Edinger, MD   25 mg at 09/01/19 [phone]  . sodium chloride flush (NS) 0.9 % injection 3 mL  3 mL Intravenous Q12H Wouk, Ailene Rud, MD   3 mL at 09/01/19 2228  . sodium chloride flush (NS) 0.9 % injection 3 mL  3 mL Intravenous PRN Wouk, Ailene Rud, MD      . traZODone (DESYREL) tablet 150 mg  150 mg Oral QHS Gwynne Edinger, MD   150 mg at 09/01/19 2218      PHYSICAL EXAMINATION:   Vitals:   08/27/19 1127 08/27/19 1129  BP:  131/73  Pulse:  62  Resp: 20   Temp: (!) 95.9 F (35.5 C)    Filed Weights   08/27/19 1127  Weight: 148 lb 12.8 oz (67.5 kg)    Physical Exam  Constitutional: She is oriented to person, place, and time.  Elderly frail-appearing female patient.  She is resting in  the wheelchair.  Accompanied by family.  HENT:  Head: Normocephalic and atraumatic.  Mouth/Throat: Oropharynx is clear and moist. No oropharyngeal exudate.  Eyes: Pupils are equal, round, and reactive to light.  Cardiovascular: Normal rate and regular rhythm.  Pulmonary/Chest: No respiratory distress. She has no wheezes.  Decreased air entry in the bases.  Abdominal: Soft. Bowel sounds are normal. She exhibits no distension and no mass. There is no abdominal tenderness. There is no rebound and no guarding.  Musculoskeletal:        General: No tenderness or edema. Normal range of motion.     Cervical back: Normal range of motion and neck supple.  Neurological: She is alert and oriented to person, place, and time.  Skin: Skin is warm.  Psychiatric: Affect normal.    LABORATORY DATA:  I have reviewed the data as listed Lab Results  Component Value Date   WBC 5.5 08/31/2019   HGB 7.6 (L) 08/31/2019   HCT 25.1 (L) 08/31/2019   MCV 87.2 08/31/2019   PLT 211 08/31/2019   Recent Labs    06/05/19 0102 06/06/19 0621 06/06/19 2023 06/06/19 2023 06/07/19 0612 06/07/19 0629 06/17/19 0505 06/17/19 0505 08/27/19 1214 08/30/19 1900 08/31/19 0654  NA 136   < > 137   < > 139   < > 139  --  142  --  143  K 4.7   < > 4.7   < > 4.2   < > 3.7  --  3.7  --  3.6  CL 102   < > 103   < > 103   < > 102  --  107  --  108  CO2 25   < > 25   < > 25   < > 30  --  24  --  28  GLUCOSE 150*   < > 147*   < > 111*   < > 119*  --  137*  --  95  BUN 61*   < > 65*   < > 63*   < > 21  --  81*  --  51*  CREATININE 3.86*   < > 3.74*   < >  3.81*   < > 2.81*   < > 4.14* 3.84* 2.97*  CALCIUM 9.2   < > 9.6   < > 9.5   < > 9.4  --  9.1  --  8.7*  GFRNONAA 10*   < > 10*   < > 10*   < > 15*   < > 9* 10* 14*  GFRAA 12*   < > 12*   < > 12*   < > 17*   < > 11* 12* 16*  PROT 6.7  --  6.3*  --   --   --   --   --  6.2*  --  5.6*  ALBUMIN 3.6  --  3.5   < > 3.3*  --   --   --  3.5  --  3.2*  AST 26  --  26  --   --    --   --   --  14*  --  14*  ALT 19  --  23  --   --   --   --   --  16  --  15  ALKPHOS 69  --  68  --   --   --   --   --  63  --  52  BILITOT 0.6  --  0.6  --   --   --   --   --  0.5  --  0.6  BILIDIR 0.1  --   --   --   --   --   --   --   --   --   --   IBILI 0.5  --   --   --   --   --   --   --   --   --   --    < > = values in this interval not displayed.     DG Chest 2 View  Result Date: 08/30/2019 CLINICAL DATA:  Shortness of breath for 1 week, TB screening EXAM: CHEST - 2 VIEW COMPARISON:  06/13/2019 FINDINGS: Cardiac shadow is enlarged but stable. Aortic calcifications are noted. Dialysis catheter is again seen and stable. Lungs are well aerated bilaterally. Vascular congestion is seen. Chronic scarring in the left base is noted. No bony abnormality is noted. IMPRESSION: Stable vascular congestion similar to that seen on the prior exam. No acute abnormality is noted. Electronically Signed   By: Inez Catalina M.D.   On: 08/30/2019 19:08    Anemia due to stage 4 chronic kidney disease (HCC) #Anemia/secondary stage IV kidney disease-iron deficiency.  Recommend IV Venofer weekly x4.  Start first infusion next week.  Discussed the potential acute infusion reactions with IV iron; which are quite rare.  Patient understands the risk; will proceed with infusions.  Discussed use of erythropoietin stimulating agents like retacrit to stimulate the bone marrow.  Discussed the potential issues with erythropoietin estimating agents-given the risk of stroke thromboembolic events/elevated blood pressure.  However, most of the serious events did not happen when the goal hematocrit is 33/hemoglobin 30.  I also discussed the use of IV iron infusion; and the potential infusion reactions.  #Chronic kidney disease stage IV-currently on dialysis.  We will repeat labs today.  Thank you, for allowing me to participate in the care of your pleasant patient. Please do not hesitate to contact me with questions  or concerns in the interim.   # DISPOSITION: # labs today [ordered] # venofer  weekly x4- start next week # in 2 weeks- MD; labs- cbc; Possible venofer; or RETACRIT- Dr.B  Addendum: Hemoglobin 6.9.  Patient is quite symptomatic.  Discussed with the patient in agreement with PRBC transfusion. Repeat GFR ~9; discussed with Dr.Kolluru; patient will likely need to be initiated on HD again.   #Discussed with the patient's niece regarding our recommendations.  Also recommend follow-up with Dr. Juleen China.  All questions were answered. The patient knows to call the clinic with any problems, questions or concerns.   Cammie Sickle, MD 09/01/2019 11:33 PM

## 2019-08-27 NOTE — Progress Notes (Signed)
Patient here today for new evaluation regarding anemia.  

## 2019-08-27 NOTE — Progress Notes (Deleted)
Patient ID: Lori Stanley, female    DOB: 1931/07/10, 86 y.o.   MRN: [id number]  HPI  Lori Stanley is a 86 y/o female with a history of DM, hyperlipidemia, HTN, CKD, previous tobacco use and chronic heart failure.   Echo report from 06/05/19 reviewed and showed an EF of 60-65% along with mildly elevated PA pressure, moderate LVH and a 7cm left pleural effusion.   Admitted 06/05/19 due to acute on chronic HF. Cardiology and vascular consults obtained. Initially was intubated and had IV lasix and lasix gtt. Transitioned to oral diuretics. Needed dialysis. Given empiric antibiotics. Discharged after 12 days with oxygen.  Lori Stanley presents today for a follow-up visit with a chief complaint of   Past Medical History:  Diagnosis Date  . Acute heart failure ([place])   . CHF (congestive heart failure) ([place])   . Chronic kidney disease   . Chronic low back pain   . Diabetes mellitus without complication ([place])   . Hyperlipidemia   . Hypertension   . MI (myocardial infarction) ([place])   . Obesity    Past Surgical History:  Procedure Laterality Date  . DIALYSIS/PERMA CATHETER INSERTION N/A 06/07/2019   Procedure: DIALYSIS/PERMA CATHETER INSERTION;  Surgeon: Katha Cabal, MD;  Location: [facility];  Service: Cardiovascular;  Laterality: N/A;  . EYE SURGERY    . KNEE SURGERY    . THYROID SURGERY    . VAGINAL HYSTERECTOMY     Family History  Problem Relation Age of Onset  . Heart attack Mother   . Heart disease Sister   . Cancer Sister    Social History   Tobacco Use  . Smoking status: Former Smoker    Types: Cigarettes  . Smokeless tobacco: Never Used  . Tobacco comment: Quit in 2015-2016  Substance Use Topics  . Alcohol use: No   Allergies  Allergen Reactions  . Antihistamines, Chlorpheniramine-Type   . Codeine   . Paxil [Paroxetine]     "makes her feel funny" not in a good way       Review of Systems  Constitutional: Positive for fatigue (very little). Negative  for appetite change.  HENT: Positive for rhinorrhea. Negative for congestion and sore throat.   Eyes: Negative.   Respiratory: Negative for cough and shortness of breath.   Cardiovascular: Positive for leg swelling. Negative for chest pain and palpitations.  Gastrointestinal: Negative for abdominal distention and abdominal pain.  Endocrine: Negative.   Genitourinary: Negative.        Does still produce urine  Musculoskeletal: Negative for back pain and neck pain.  Skin: Negative.   Allergic/Immunologic: Negative.   Neurological: Negative for dizziness, light-headedness and headaches.  Hematological: Negative for adenopathy. Does not bruise/bleed easily.  Psychiatric/Behavioral: Negative for dysphoric mood and sleep disturbance (sleeping on 1 pillow with oxygen @ 2L). The patient is not nervous/anxious.      Physical Exam Vitals and nursing note reviewed.  Constitutional:      Appearance: Normal appearance.  HENT:     Head: Normocephalic and atraumatic.  Cardiovascular:     Rate and Rhythm: Normal rate and regular rhythm.  Pulmonary:     Effort: Pulmonary effort is normal. No respiratory distress.     Breath sounds: No wheezing or rales.  Abdominal:     General: Abdomen is flat. There is no distension.     Palpations: Abdomen is soft.  Musculoskeletal:        General: No tenderness.  Cervical back: Normal range of motion and neck supple.     Right lower leg: Edema (2+ pitting) present.     Left lower leg: Edema (2+pitting) present.  Skin:    General: Skin is warm and dry.  Neurological:     General: No focal deficit present.     Mental Status: Lori Stanley is alert and oriented to person, place, and time.  Psychiatric:        Mood and Affect: Mood normal.        Behavior: Behavior normal.    Assessment & Plan:  1: Chronic heart failure with preserved ejection fraction with structural changes- - NYHA class II - euvolemic today - weighing daily and Lori Stanley says that her weight  has been stable; reminded to call for an overnight weight gain of >2 pounds or a weekly weight gain of >5 pounds - weight 137.6 from last visit here 6 weeks ago - not adding salt to her food and tries to eat low sodium foods; reviewed the importance of closely following a 2000mg  sodium diet - [facility] cardiology saw patient during February admission - BNP 06/06/19 was 549.0   2: HTN- - BP  - saw PCP [name]) 07/23/19 - BMP from 08/14/19 reviewed and showed sodium 144, potassium 4.1, creatinine 3.77 and GFR 12  3: DM-  - A1c 04/10/2019 was 6.0% - nonfasting glucose in clinic today was   4: CKD stage IV- - has been on dialysis before but is currently not doing that - does produce urine - saw nephrology Maia Breslow) 08/14/19  - saw vascular Delana Meyer) 08/26/19  5: Lymphedema- - stage 2 - limited in her ability to exercise due to being unsteady - has worn compression socks in the past but says that they "cut into her leg"; encouraged her and her niece to get a larger size and try them again with putting them on every morning with removal at bedtime; may need to get measured at a medical supply store - does elevate her legs at night with some improvement in her swelling; encouraged her to elevate them during the day when sitting for long periods of time - discussed lymphapress compression boots if edema persists; brochure given to patient   Medication bottles were reviewed.

## 2019-08-27 NOTE — Telephone Encounter (Signed)
Per Dr. Rogue Bussing. Patient requires 1 unit of blood. Left vm for pt to return my phone call.   I contacted patient's niece - [name] as I was unable to reach patient. Per niece - Patient/family agreeable to blood transfusion. Will able to come at 8:30 for type/screen.  Patient has an apt with Darylene Price, NP- cardiology clinic for follow-up tomorrow. This apt time conflict with her apts [facility] in [facility]. I left a vm for heart care to see if we can cnl this apt tomorrow for heart care. We need to add patient to blood transfusion tomorrow. I sent a secure msg to Otila Kluver explaining the need to cnl the vascular apt.

## 2019-08-27 NOTE — Telephone Encounter (Signed)
Spoke with the patient's daughter and she is now scheduled for a permcath removal with Dr. Delana Meyer on 09/03/19 with a 12:45 pm arrival time to the [facility]. Patient will do covid testing on 08/30/19 between 8-1 pm at the [facility]. Pre-procedure instructions were discussed and will be mailed to the patient.

## 2019-08-28 ENCOUNTER — Inpatient Hospital Stay: Payer: Medicare Other

## 2019-08-28 ENCOUNTER — Other Ambulatory Visit: Payer: Self-pay | Admitting: *Deleted

## 2019-08-28 ENCOUNTER — Other Ambulatory Visit: Payer: Self-pay

## 2019-08-28 ENCOUNTER — Ambulatory Visit: Payer: Medicare Other | Admitting: Family

## 2019-08-28 ENCOUNTER — Other Ambulatory Visit: Payer: Self-pay | Admitting: Internal Medicine

## 2019-08-28 DIAGNOSIS — D631 Anemia in chronic kidney disease: Secondary | ICD-10-CM

## 2019-08-28 DIAGNOSIS — N184 Chronic kidney disease, stage 4 (severe): Secondary | ICD-10-CM

## 2019-08-28 LAB — PREPARE RBC (CROSSMATCH)

## 2019-08-28 LAB — HAPTOGLOBIN: Haptoglobin: 94 mg/dL (ref 41–333)

## 2019-08-28 MED ORDER — ACETAMINOPHEN 325 MG PO TABS
650.0000 mg | ORAL_TABLET | Freq: Once | ORAL | Status: AC
  Administered 2019-08-28: 650 mg via ORAL
  Filled 2019-08-28: qty 2

## 2019-08-28 MED ORDER — SODIUM CHLORIDE 0.9% IV SOLUTION
250.0000 mL | Freq: Once | INTRAVENOUS | Status: AC
  Administered 2019-08-28: 10:00:00 250 mL via INTRAVENOUS
  Filled 2019-08-28: qty 250

## 2019-08-28 MED ORDER — SODIUM CHLORIDE 0.9% FLUSH
3.0000 mL | INTRAVENOUS | Status: AC | PRN
  Administered 2019-08-28: 3 mL
  Filled 2019-08-28: qty 3

## 2019-08-28 MED ORDER — DIPHENHYDRAMINE HCL 25 MG PO CAPS
25.0000 mg | ORAL_CAPSULE | Freq: Once | ORAL | Status: AC
  Administered 2019-08-28: 10:00:00 25 mg via ORAL
  Filled 2019-08-28: qty 1

## 2019-08-28 NOTE — Progress Notes (Signed)
Pt tolerated blood transfusion well with no signs of complications or reactions. VSS prior, during, and post transfusion. Pt stable for discharge. RN educated pt on the importance of calling the clinic if any signs of complications occur at home and if it is an emergency to call 911. Pt verbalized understanding and all questions answered at this time.    Jolie Strohecker [employer]

## 2019-08-28 NOTE — Telephone Encounter (Signed)
Per Darylene Price - will have have her staff r/s the cardiology apt for today.

## 2019-08-29 ENCOUNTER — Telehealth: Payer: Self-pay | Admitting: Internal Medicine

## 2019-08-29 DIAGNOSIS — D631 Anemia in chronic kidney disease: Secondary | ICD-10-CM

## 2019-08-29 LAB — TYPE AND SCREEN
ABO/RH(D): O POS
Antibody Screen: NEGATIVE
Unit division: 0

## 2019-08-29 LAB — BPAM RBC
Blood Product Expiration Date: 202106012359
ISSUE DATE / TIME: 202104281006
Unit Type and Rh: 5100

## 2019-08-29 NOTE — Telephone Encounter (Signed)
Patient's nephrologist called to cancel the patient's permcath removal.

## 2019-08-29 NOTE — Telephone Encounter (Signed)
On 4/28-after discussing pros and cons of blood transfusion; patient consents to blood transfusion-1 unit PRBC.  On 4/28 I spoke to Dr.Kolluru-regarding worsening renal function.  As per the discussion patient will likely need to be started back on dialysis.   On 4/29-patient cannot be reached.  Which of the patient's niece discussed her concerns regarding worsening renal function likely needing dialysis again.  Recommended calling nephrology office for further recommendations  H/T/C--please add BMP/H&H; hold tube; possible need of! Unit PRBC on 5/5;also keep Venofer on schedule for now.

## 2019-08-30 ENCOUNTER — Inpatient Hospital Stay: Payer: Medicare Other

## 2019-08-30 ENCOUNTER — Inpatient Hospital Stay
Admission: AD | Admit: 2019-08-30 | Discharge: 2019-09-03 | DRG: 291 | Disposition: A | Discharge status: Home / Self Care | Payer: Medicare Other | Source: Ambulatory Visit | Attending: Internal Medicine | Admitting: Internal Medicine

## 2019-08-30 ENCOUNTER — Other Ambulatory Visit: Payer: Medicare Other

## 2019-08-30 DIAGNOSIS — Z8249 Family history of ischemic heart disease and other diseases of the circulatory system: Secondary | ICD-10-CM

## 2019-08-30 DIAGNOSIS — E1165 Type 2 diabetes mellitus with hyperglycemia: Secondary | ICD-10-CM | POA: Diagnosis not present

## 2019-08-30 DIAGNOSIS — Z809 Family history of malignant neoplasm, unspecified: Secondary | ICD-10-CM

## 2019-08-30 DIAGNOSIS — Z87891 Personal history of nicotine dependence: Secondary | ICD-10-CM | POA: Diagnosis not present

## 2019-08-30 DIAGNOSIS — E1122 Type 2 diabetes mellitus with diabetic chronic kidney disease: Secondary | ICD-10-CM | POA: Diagnosis present

## 2019-08-30 DIAGNOSIS — J9601 Acute respiratory failure with hypoxia: Secondary | ICD-10-CM | POA: Diagnosis present

## 2019-08-30 DIAGNOSIS — E8779 Other fluid overload: Secondary | ICD-10-CM | POA: Diagnosis not present

## 2019-08-30 DIAGNOSIS — Z888 Allergy status to other drugs, medicaments and biological substances status: Secondary | ICD-10-CM | POA: Diagnosis not present

## 2019-08-30 DIAGNOSIS — E785 Hyperlipidemia, unspecified: Secondary | ICD-10-CM | POA: Diagnosis present

## 2019-08-30 DIAGNOSIS — I120 Hypertensive chronic kidney disease with stage 5 chronic kidney disease or end stage renal disease: Secondary | ICD-10-CM | POA: Diagnosis not present

## 2019-08-30 DIAGNOSIS — Z7982 Long term (current) use of aspirin: Secondary | ICD-10-CM

## 2019-08-30 DIAGNOSIS — Z992 Dependence on renal dialysis: Secondary | ICD-10-CM | POA: Diagnosis not present

## 2019-08-30 DIAGNOSIS — G4700 Insomnia, unspecified: Secondary | ICD-10-CM | POA: Diagnosis present

## 2019-08-30 DIAGNOSIS — J9690 Respiratory failure, unspecified, unspecified whether with hypoxia or hypercapnia: Secondary | ICD-10-CM | POA: Diagnosis present

## 2019-08-30 DIAGNOSIS — R809 Proteinuria, unspecified: Secondary | ICD-10-CM | POA: Diagnosis not present

## 2019-08-30 DIAGNOSIS — H409 Unspecified glaucoma: Secondary | ICD-10-CM | POA: Diagnosis present

## 2019-08-30 DIAGNOSIS — I509 Heart failure, unspecified: Secondary | ICD-10-CM

## 2019-08-30 DIAGNOSIS — Z9981 Dependence on supplemental oxygen: Secondary | ICD-10-CM

## 2019-08-30 DIAGNOSIS — J9621 Acute and chronic respiratory failure with hypoxia: Secondary | ICD-10-CM

## 2019-08-30 DIAGNOSIS — Z794 Long term (current) use of insulin: Secondary | ICD-10-CM

## 2019-08-30 DIAGNOSIS — E8770 Fluid overload, unspecified: Secondary | ICD-10-CM

## 2019-08-30 DIAGNOSIS — N2581 Secondary hyperparathyroidism of renal origin: Secondary | ICD-10-CM | POA: Diagnosis not present

## 2019-08-30 DIAGNOSIS — I5043 Acute on chronic combined systolic (congestive) and diastolic (congestive) heart failure: Secondary | ICD-10-CM | POA: Diagnosis present

## 2019-08-30 DIAGNOSIS — I252 Old myocardial infarction: Secondary | ICD-10-CM

## 2019-08-30 DIAGNOSIS — D631 Anemia in chronic kidney disease: Secondary | ICD-10-CM | POA: Diagnosis not present

## 2019-08-30 DIAGNOSIS — Z79899 Other long term (current) drug therapy: Secondary | ICD-10-CM | POA: Diagnosis not present

## 2019-08-30 DIAGNOSIS — I132 Hypertensive heart and chronic kidney disease with heart failure and with stage 5 chronic kidney disease, or end stage renal disease: Principal | ICD-10-CM | POA: Diagnosis present

## 2019-08-30 DIAGNOSIS — N186 End stage renal disease: Secondary | ICD-10-CM | POA: Diagnosis present

## 2019-08-30 DIAGNOSIS — F419 Anxiety disorder, unspecified: Secondary | ICD-10-CM | POA: Diagnosis present

## 2019-08-30 DIAGNOSIS — Z885 Allergy status to narcotic agent status: Secondary | ICD-10-CM | POA: Diagnosis not present

## 2019-08-30 DIAGNOSIS — E1169 Type 2 diabetes mellitus with other specified complication: Secondary | ICD-10-CM | POA: Diagnosis present

## 2019-08-30 DIAGNOSIS — N179 Acute kidney failure, unspecified: Secondary | ICD-10-CM | POA: Diagnosis present

## 2019-08-30 DIAGNOSIS — D638 Anemia in other chronic diseases classified elsewhere: Secondary | ICD-10-CM | POA: Diagnosis not present

## 2019-08-30 DIAGNOSIS — I10 Essential (primary) hypertension: Secondary | ICD-10-CM | POA: Diagnosis present

## 2019-08-30 DIAGNOSIS — Z20822 Contact with and (suspected) exposure to covid-19: Secondary | ICD-10-CM | POA: Diagnosis present

## 2019-08-30 DIAGNOSIS — G8929 Other chronic pain: Secondary | ICD-10-CM | POA: Diagnosis present

## 2019-08-30 DIAGNOSIS — E119 Type 2 diabetes mellitus without complications: Secondary | ICD-10-CM

## 2019-08-30 DIAGNOSIS — N185 Chronic kidney disease, stage 5: Secondary | ICD-10-CM | POA: Diagnosis not present

## 2019-08-30 DIAGNOSIS — R0602 Shortness of breath: Secondary | ICD-10-CM | POA: Diagnosis present

## 2019-08-30 DIAGNOSIS — M545 Low back pain: Secondary | ICD-10-CM | POA: Diagnosis present

## 2019-08-30 DIAGNOSIS — Z111 Encounter for screening for respiratory tuberculosis: Secondary | ICD-10-CM

## 2019-08-30 HISTORY — DX: Fluid overload, unspecified: E87.70

## 2019-08-30 LAB — CBC
HCT: 25.8 % — ABNORMAL LOW (ref 36.0–46.0)
Hemoglobin: 8.3 g/dL — ABNORMAL LOW (ref 12.0–15.0)
MCH: 27.2 pg (ref 26.0–34.0)
MCHC: 32.2 g/dL (ref 30.0–36.0)
MCV: 84.6 fL (ref 80.0–100.0)
Platelets: 229 10*3/uL (ref 150–400)
RBC: 3.05 MIL/uL — ABNORMAL LOW (ref 3.87–5.11)
RDW: 16.5 % — ABNORMAL HIGH (ref 11.5–15.5)
WBC: 7 10*3/uL (ref 4.0–10.5)
nRBC: 0 % (ref 0.0–0.2)

## 2019-08-30 LAB — RESPIRATORY PANEL BY RT PCR (FLU A&B, COVID)
Influenza A by PCR: NEGATIVE
Influenza B by PCR: NEGATIVE
SARS Coronavirus 2 by RT PCR: NEGATIVE

## 2019-08-30 LAB — MRSA PCR SCREENING: MRSA by PCR: NEGATIVE

## 2019-08-30 LAB — CREATININE, SERUM
Creatinine, Ser: 3.84 mg/dL — ABNORMAL HIGH (ref 0.44–1.00)
GFR calc Af Amer: 12 mL/min — ABNORMAL LOW (ref >60)
GFR calc non Af Amer: 10 mL/min — ABNORMAL LOW (ref >60)

## 2019-08-30 LAB — GLUCOSE, CAPILLARY: Glucose-Capillary: 161 mg/dL — ABNORMAL HIGH (ref 70–99)

## 2019-08-30 MED ORDER — TRAZODONE HCL 50 MG PO TABS
150.0000 mg | ORAL_TABLET | Freq: Every day | ORAL | Status: DC
  Administered 2019-08-31 – 2019-09-02 (×4): 150 mg via ORAL
  Filled 2019-08-30 (×4): qty 1

## 2019-08-30 MED ORDER — AMLODIPINE BESYLATE 5 MG PO TABS
5.0000 mg | ORAL_TABLET | Freq: Every day | ORAL | Status: DC
  Administered 2019-08-31 – 2019-09-01 (×2): 5 mg via ORAL
  Filled 2019-08-30 (×3): qty 1

## 2019-08-30 MED ORDER — SODIUM CHLORIDE 0.9% FLUSH
3.0000 mL | Freq: Two times a day (BID) | INTRAVENOUS | Status: DC
  Administered 2019-08-31 – 2019-09-02 (×7): 3 mL via INTRAVENOUS

## 2019-08-30 MED ORDER — CHLORHEXIDINE GLUCONATE CLOTH 2 % EX PADS
6.0000 | MEDICATED_PAD | Freq: Every day | CUTANEOUS | Status: DC
  Administered 2019-08-31 – 2019-09-03 (×2): 6 via TOPICAL

## 2019-08-30 MED ORDER — IRBESARTAN 150 MG PO TABS
75.0000 mg | ORAL_TABLET | Freq: Every day | ORAL | Status: DC
  Administered 2019-08-31 – 2019-09-01 (×2): 75 mg via ORAL
  Filled 2019-08-30 (×3): qty 1

## 2019-08-30 MED ORDER — OLOPATADINE HCL 0.1 % OP SOLN
1.0000 [drp] | Freq: Two times a day (BID) | OPHTHALMIC | Status: DC
  Administered 2019-08-31 – 2019-09-03 (×8): 1 [drp] via OPHTHALMIC
  Filled 2019-08-30: qty 5

## 2019-08-30 MED ORDER — CARVEDILOL 25 MG PO TABS
25.0000 mg | ORAL_TABLET | Freq: Two times a day (BID) | ORAL | Status: DC
  Administered 2019-08-31 – 2019-09-02 (×5): 25 mg via ORAL
  Filled 2019-08-30 (×6): qty 1

## 2019-08-30 MED ORDER — FUROSEMIDE 10 MG/ML IJ SOLN
80.0000 mg | Freq: Two times a day (BID) | INTRAMUSCULAR | Status: DC
  Administered 2019-08-31 – 2019-09-03 (×7): 80 mg via INTRAVENOUS
  Filled 2019-08-30 (×7): qty 8

## 2019-08-30 MED ORDER — ASPIRIN EC 81 MG PO TBEC
81.0000 mg | DELAYED_RELEASE_TABLET | Freq: Every day | ORAL | Status: DC
  Administered 2019-08-31 – 2019-09-03 (×4): 81 mg via ORAL
  Filled 2019-08-30 (×4): qty 1

## 2019-08-30 MED ORDER — DORZOLAMIDE HCL-TIMOLOL MAL 2-0.5 % OP SOLN
1.0000 [drp] | Freq: Two times a day (BID) | OPHTHALMIC | Status: DC
  Administered 2019-08-31 – 2019-09-03 (×8): 1 [drp] via OPHTHALMIC
  Filled 2019-08-30: qty 10

## 2019-08-30 MED ORDER — SERTRALINE HCL 50 MG PO TABS
25.0000 mg | ORAL_TABLET | Freq: Every day | ORAL | Status: DC
  Administered 2019-08-31 – 2019-09-03 (×4): 25 mg via ORAL
  Filled 2019-08-30 (×4): qty 1

## 2019-08-30 MED ORDER — SODIUM CHLORIDE 0.9% FLUSH: 3.0000 mL | INTRAVENOUS | Status: DC | PRN

## 2019-08-30 MED ORDER — SODIUM CHLORIDE 0.9 % IV SOLN: 250.0000 mL | INTRAVENOUS | Status: DC | PRN

## 2019-08-30 MED ORDER — LATANOPROST 0.005 % OP SOLN
1.0000 [drp] | Freq: Every day | OPHTHALMIC | Status: DC
  Administered 2019-08-31 – 2019-09-02 (×4): 1 [drp] via OPHTHALMIC
  Filled 2019-08-30: qty 2.5

## 2019-08-30 MED ORDER — LORATADINE 10 MG PO TABS
10.0000 mg | ORAL_TABLET | Freq: Every day | ORAL | Status: DC
  Administered 2019-08-31 – 2019-09-03 (×4): 10 mg via ORAL
  Filled 2019-08-30 (×4): qty 1

## 2019-08-30 MED ORDER — HEPARIN SODIUM (PORCINE) 5000 UNIT/ML IJ SOLN
5000.0000 [IU] | Freq: Three times a day (TID) | INTRAMUSCULAR | Status: DC
  Administered 2019-08-31 – 2019-09-03 (×11): 5000 [IU] via SUBCUTANEOUS
  Filled 2019-08-30 (×11): qty 1

## 2019-08-30 MED ORDER — BRIMONIDINE TARTRATE 0.2 % OP SOLN
1.0000 [drp] | Freq: Two times a day (BID) | OPHTHALMIC | Status: DC
  Administered 2019-08-31 – 2019-09-03 (×8): 1 [drp] via OPHTHALMIC
  Filled 2019-08-30: qty 5

## 2019-08-30 NOTE — Progress Notes (Signed)
Please refer to M. Tedrow, PA-C's note from earlier today available on CareEverywhere. Patient to be initiated on hemodialysis for acute renal failure on chronic kidney disease stage V and start IV furosemide.   Lavonia Dana MD

## 2019-08-30 NOTE — Progress Notes (Signed)
Respiratory panel delivered to Lab.   Fuller Mandril, RN

## 2019-08-30 NOTE — H&P (Signed)
History and Physical    Lori Stanley GYB:[id number] DOB: December 14, 1931 DOA: 08/30/2019  PCP: Jerrol Banana., MD  Patient coming from: home, directly admitted   Chief Complaint: shortness of breath  HPI: Lori Stanley is a 86 y.o. female with medical history significant for esrd not currently on RRT, HTN, dCHF, DM, glaucoma, anemia, chronic respiratory failure, who presents with above.  Was transfused one unit PRBCs earlier this week for anemia (hgb in the 6s).  She reports one week of worsening shortness of breath. Not described as severe. Mainly when she ambulates. Comes and goes. Does report increased lower extremity swelling. Says is compliant with her meds. Denies chest pain or palpitations. Denies cough or fever. Uses O2 at night at baseline. No vomiting or diarrhea. No recent med changes. No recent sick contacts.   ED Course: n/a (directly admitted after discussion w/ nephrology for hypoxia and shortness of breath)  Review of Systems: As per HPI otherwise 10 point review of systems negative.    Past Medical History:  Diagnosis Date  . Acute heart failure ([place])   . CHF (congestive heart failure) ([place])   . Chronic kidney disease   . Chronic low back pain   . Diabetes mellitus without complication ([place])   . Hyperlipidemia   . Hypertension   . MI (myocardial infarction) ([place])   . Obesity     Past Surgical History:  Procedure Laterality Date  . DIALYSIS/PERMA CATHETER INSERTION N/A 06/07/2019   Procedure: DIALYSIS/PERMA CATHETER INSERTION;  Surgeon: Katha Cabal, MD;  Location: [facility];  Service: Cardiovascular;  Laterality: N/A;  . EYE SURGERY    . KNEE SURGERY    . THYROID SURGERY    . VAGINAL HYSTERECTOMY       reports that she has quit smoking. Her smoking use included cigarettes. She has never used smokeless tobacco. She reports that she does not drink alcohol or use drugs.  Allergies  Allergen Reactions  . Antihistamines,  Chlorpheniramine-Type   . Codeine   . Paxil [Paroxetine]     "makes her feel funny" not in a good way    Family History  Problem Relation Age of Onset  . Heart attack Mother   . Heart disease Sister   . Cancer Sister     Prior to Admission medications   Medication Sig Start Date End Date Taking? Authorizing Provider  acetaminophen (TYLENOL) 500 MG tablet Take 1,000 mg by mouth every 6 (six) hours as needed.    [provider]  albuterol (VENTOLIN HFA) 108 (90 Base) MCG/ACT inhaler Inhale 2 puffs into the lungs every 6 (six) hours as needed for wheezing or shortness of breath. 06/25/19   Jerrol Banana., MD  amLODipine (NORVASC) 5 MG tablet Take 1 tablet (5 mg total) by mouth daily. 11/05/18   Jerrol Banana., MD  ASPIRIN LOW DOSE 81 MG EC tablet TAKE 1 TABLET (81 MG TOTAL) BY MOUTH DAILY. Patient taking differently: Take 81 mg by mouth daily.  06/02/19   Jerrol Banana., MD  Bepotastine Besilate (BEPREVE) 1.5 % SOLN Place 1 drop into both eyes daily.    [provider]  Blood Glucose Monitoring Suppl (ONE TOUCH ULTRA MINI) w/Device KIT 1 kit by Does not apply route daily. 01/22/19   Jerrol Banana., MD  brimonidine (ALPHAGAN) 0.2 % ophthalmic solution 1 drop 2 (two) times daily. To affected eye(s) 03/06/19   [provider]  calcitRIOL (ROCALTROL)  0.25 MCG capsule Take 0.25 mcg by mouth every Monday, Wednesday, and Friday.  10/23/18   [provider]  carvedilol (COREG) 12.5 MG tablet TAKE 2 TABLETS (25 MG TOTAL) BY MOUTH 2 (TWO) TIMES DAILY. 07/29/19   Jerrol Banana., MD  dorzolamide-timolol (COSOPT) 22.3-6.8 MG/ML ophthalmic solution Place 1 drop into both eyes 2 (two) times daily.  03/06/19   [provider]  Insulin Pen Needle (B-D UF III MINI PEN NEEDLES) 31G X 5 MM MISC USE TWICE A DAY AS DIRECTED 02/11/19   Jerrol Banana., MD  latanoprost (XALATAN) 0.005 % ophthalmic solution Place 1 drop into the left  eye at bedtime.  02/11/15   [provider]  loratadine (CLARITIN) 10 MG tablet Take 1 tablet (10 mg total) by mouth daily. 12/15/14   Jerrol Banana., MD  OneTouch Delica Lancets 44Y MISC USE AS DIRECTED 05/10/19   Jerrol Banana., MD  [facility] ULTRA test strip USE AS DIRECTED 04/20/19   Jerrol Banana., MD  sertraline (ZOLOFT) 25 MG tablet TAKE 1 TABLET BY MOUTH EVERY DAY 04/02/19   Jerrol Banana., MD  telmisartan (MICARDIS) 20 MG tablet TAKE 1 TABLET BY MOUTH EVERY DAY Patient taking differently: Take 40 mg by mouth daily.  06/02/19   Jerrol Banana., MD  torsemide (DEMADEX) 20 MG tablet TAKE 2 TABLETS BY MOUTH EVERY DAY 07/29/19   Jerrol Banana., MD  traZODone (DESYREL) 150 MG tablet Take 150 mg by mouth at bedtime.    [provider]  Vitamin D, Ergocalciferol, (DRISDOL) 50000 units CAPS capsule Take 50,000 Units by mouth every 7 (seven) days.    [provider]    Physical Exam: Vitals:   08/30/19 1733 08/30/19 1823  BP: (!) 166/86   Pulse: 90   Resp: (!) 28   Temp: 98.6 F (37 C)   TempSrc: Oral   SpO2: (!) 84% 98%    Constitutional: No acute distress Head: Atraumatic Eyes: Conjunctiva clear ENM: Moist mucous membranes. Normal dentition.  Neck: Supple Respiratory: faint rales at the bases, otherwise clear Cardiovascular: Regular rate and rhythm. Moderate systolic murmur Abdomen: Non-tender, non-distended. No masses. No rebound or guarding. Positive bowel sounds. Musculoskeletal: No joint deformity upper and lower extremities. Normal ROM, no contractures. Normal muscle tone.  Skin: No rashes, lesions, or ulcers.  Extremities:moderate LE pitting edema. Warm extremities Neurologic: Alert, moving all 4 extremities. Psychiatric: Normal insight and judgement.   Labs on Admission: I have personally reviewed following labs and imaging studies  CBC: Recent Labs  Lab 08/27/19 1214  WBC 5.3  NEUTROABS 4.2  HGB  6.9*  HCT 22.8*  MCV 86.0  PLT 185   Basic Metabolic Panel: Recent Labs  Lab 08/27/19 1214  NA 142  K 3.7  CL 107  CO2 24  GLUCOSE 137*  BUN 81*  CREATININE 4.14*  CALCIUM 9.1   GFR: Estimated Creatinine Clearance: 9.2 mL/min (A) (by C-G formula based on SCr of 4.14 mg/dL (H)). Liver Function Tests: Recent Labs  Lab 08/27/19 1214  AST 14*  ALT 16  ALKPHOS 63  BILITOT 0.5  PROT 6.2*  ALBUMIN 3.5   No results for input(s): LIPASE, AMYLASE in the last 168 hours. No results for input(s): AMMONIA in the last 168 hours. Coagulation Profile: No results for input(s): INR, PROTIME in the last 168 hours. Cardiac Enzymes: No results for input(s): CKTOTAL, CKMB, CKMBINDEX, TROPONINI in the last 168 hours. BNP (  last 3 results) No results for input(s): PROBNP in the last 8760 hours. HbA1C: No results for input(s): HGBA1C in the last 72 hours. CBG: No results for input(s): GLUCAP in the last 168 hours. Lipid Profile: No results for input(s): CHOL, HDL, LDLCALC, TRIG, CHOLHDL, LDLDIRECT in the last 72 hours. Thyroid Function Tests: No results for input(s): TSH, T4TOTAL, FREET4, T3FREE, THYROIDAB in the last 72 hours. Anemia Panel: No results for input(s): VITAMINB12, FOLATE, FERRITIN, TIBC, IRON, RETICCTPCT in the last 72 hours. Urine analysis:    Component Value Date/Time   COLORURINE Yellow 05/11/2013 2255   APPEARANCEUR Clear 05/11/2013 2255   LABSPEC 1.010 05/11/2013 2255   PHURINE 7.0 05/11/2013 2255   GLUCOSEU >=500 05/11/2013 2255   HGBUR 1+ 05/11/2013 2255   BILIRUBINUR Negative 05/11/2013 2255   KETONESUR Negative 05/11/2013 2255   PROTEINUR >=500 05/11/2013 2255   NITRITE Negative 05/11/2013 2255   LEUKOCYTESUR Negative 05/11/2013 2255    Radiological Exams on Admission: No results found.  KGU:[id number]  Assessment/Plan Active Problems:   Essential hypertension, malignant   Glaucoma   Diabetes ([place])   Anemia of chronic disease   Type 2 diabetes  mellitus with hyperlipidemia (HCC)   Respiratory failure (HCC)   ESRD (end stage renal disease) (HCC)   Volume overload   # Volume overload # Hypoxic respiratory failure - hx intermittent dialysis in the past. More recently treated with oral diuretics. Per nephrology has recently shown symptoms of uremia. Today showing clinical signs of volume overload and hypoxic to mid-80s at rest. On exam, on 2 L, O2 is wnl and pt is resting comfortably without complaint. Was recently transfused 1 u prbcs; anemia may contribute to current symptoms. No chest pain to suggest ischemic process, no signs DVT on exam. - dialysis tonight per nephrology, who has also ordered lasix 80 mg IV bid - f/u cbc, bmp - f/u ekg - continue [place] O2 - daily weights  # HTN - uncontrolled in setting of volume overload - dialysis as above, continue home meds (irbesartan for telmesartan, amlodipine, carvedilol, aspirin)  # Diastolic heart failure - volume overload as above - dialysis as above  # T2DM - diet controlled, recent a1c 6.0 - f/u random glucose  # Glaucoma - continue home meds  # Anemia - of renal origin. S/p 1 u prbc earlier this week. Baseline hgb 7s-8s, was 6.9 on 4/27 prior to transfusion. - f/u cbc  # Anxiety # insomnia - cont home sertraline, trazodone  DVT prophylaxis: heparin Code Status: full  Family Communication: niece shirley  Disposition Plan: tbd  Consults called: nephrology  Admission status: med/surg    Desma Maxim MD [facility] Pager [phone]  If 7PM-7AM, please contact night-coverage [email or web address] Password [facility]  08/30/2019, 6:34 PM

## 2019-08-30 NOTE — Telephone Encounter (Signed)
Patient already had vascular apts in the am on 5/5. Unable to add blood transfusion in the morning of 5/5 if patient needs this. Colette, please add lab on 5/5 and possible 1 unit of blood transfusion on 5/6.

## 2019-08-30 NOTE — Addendum Note (Signed)
Addended by: Gloris Ham on: 08/30/2019 09:06 AM   Modules accepted: Orders

## 2019-08-30 NOTE — Progress Notes (Signed)
   08/30/19 2222  Vital Signs  Temp 98.2 F (36.8 C)  Temp Source Oral  Pulse Rate 62  Pulse Rate Source Monitor  Resp (!) 21  BP (!) 164/85  BP Location Right Arm  BP Method Automatic  Patient Position (if appropriate) Lying  Oxygen Therapy  SpO2 98 %  O2 Device Nasal Cannula  O2 Flow Rate (L/min) 2 L/min  Pain Assessment  Pain Scale 0-10  Pain Score 0  During Hemodialysis Assessment  Blood Flow Rate (mL/min) 100 mL/min  Arterial Pressure (mmHg) 40 mmHg  Venous Pressure (mmHg) 30 mmHg  Transmembrane Pressure (mmHg) 40 mmHg  Ultrafiltration Rate (mL/min) 0 mL/min  Dialysate Flow Rate (mL/min) 300 ml/min  Conductivity: Machine  13.4  HD Safety Checks Performed Yes  Dialysis Fluid Bolus Normal Saline  Bolus Amount (mL) 250 mL  Intra-Hemodialysis Comments Tolerated well;Tx completed  Post-Hemodialysis Assessment  Rinseback Volume (mL) 250 mL  Dialyzer Clearance Lightly streaked  Duration of HD Treatment -hour(s) 2 hour(s)  Hemodialysis Intake (mL) 500 mL  UF Total -Machine (mL) 1000 mL  Net UF (mL) 500 mL  Tolerated HD Treatment Yes  Post-Hemodialysis Comments tolerated well no problems to note  Education / Care Plan  Dialysis Education Provided Yes  Note  Observations no c/o tolerated well  TOLERATED TREATMENT COMPLETED 5OO ML REMOVED

## 2019-08-31 ENCOUNTER — Other Ambulatory Visit: Payer: Self-pay

## 2019-08-31 ENCOUNTER — Encounter: Payer: Self-pay | Admitting: Obstetrics and Gynecology

## 2019-08-31 DIAGNOSIS — E8779 Other fluid overload: Secondary | ICD-10-CM | POA: Diagnosis not present

## 2019-08-31 DIAGNOSIS — D638 Anemia in other chronic diseases classified elsewhere: Secondary | ICD-10-CM

## 2019-08-31 DIAGNOSIS — N186 End stage renal disease: Secondary | ICD-10-CM | POA: Diagnosis not present

## 2019-08-31 LAB — COMPREHENSIVE METABOLIC PANEL WITH GFR
ALT: 15 U/L (ref 0–44)
AST: 14 U/L — ABNORMAL LOW (ref 15–41)
Albumin: 3.2 g/dL — ABNORMAL LOW (ref 3.5–5.0)
Alkaline Phosphatase: 52 U/L (ref 38–126)
Anion gap: 7 (ref 5–15)
BUN: 51 mg/dL — ABNORMAL HIGH (ref 8–23)
CO2: 28 mmol/L (ref 22–32)
Calcium: 8.7 mg/dL — ABNORMAL LOW (ref 8.9–10.3)
Chloride: 108 mmol/L (ref 98–111)
Creatinine, Ser: 2.97 mg/dL — ABNORMAL HIGH (ref 0.44–1.00)
GFR calc Af Amer: 16 mL/min — ABNORMAL LOW (ref >60)
GFR calc non Af Amer: 14 mL/min — ABNORMAL LOW (ref >60)
Glucose, Bld: 95 mg/dL (ref 70–99)
Potassium: 3.6 mmol/L (ref 3.5–5.1)
Sodium: 143 mmol/L (ref 135–145)
Total Bilirubin: 0.6 mg/dL (ref 0.3–1.2)
Total Protein: 5.6 g/dL — ABNORMAL LOW (ref 6.5–8.1)

## 2019-08-31 LAB — HEPATITIS B CORE ANTIBODY, TOTAL: Hep B Core Total Ab: NONREACTIVE

## 2019-08-31 LAB — HEPATITIS B SURFACE ANTIGEN: Hepatitis B Surface Ag: NONREACTIVE

## 2019-08-31 LAB — GLUCOSE, CAPILLARY
Glucose-Capillary: 89 mg/dL (ref 70–99)
Glucose-Capillary: 167 mg/dL — ABNORMAL HIGH (ref 70–99)
Glucose-Capillary: 110 mg/dL — ABNORMAL HIGH (ref 70–99)

## 2019-08-31 LAB — CBC
HCT: 25.1 % — ABNORMAL LOW (ref 36.0–46.0)
Hemoglobin: 7.6 g/dL — ABNORMAL LOW (ref 12.0–15.0)
MCH: 26.4 pg (ref 26.0–34.0)
MCHC: 30.3 g/dL (ref 30.0–36.0)
MCV: 87.2 fL (ref 80.0–100.0)
Platelets: 211 10*3/uL (ref 150–400)
RBC: 2.88 MIL/uL — ABNORMAL LOW (ref 3.87–5.11)
RDW: 16.3 % — ABNORMAL HIGH (ref 11.5–15.5)
WBC: 5.5 10*3/uL (ref 4.0–10.5)
nRBC: 0 % (ref 0.0–0.2)

## 2019-08-31 LAB — HEPATITIS C ANTIBODY: HCV Ab: NONREACTIVE

## 2019-08-31 LAB — HEPATITIS B SURFACE ANTIBODY,QUALITATIVE: Hep B S Ab: NONREACTIVE

## 2019-08-31 MED ORDER — INSULIN ASPART 100 UNIT/ML ~~LOC~~ SOLN
0.0000 [IU] | Freq: Three times a day (TID) | SUBCUTANEOUS | Status: DC
  Administered 2019-08-31: 1 [IU] via SUBCUTANEOUS
  Filled 2019-08-31: qty 1

## 2019-08-31 MED ORDER — EPOETIN ALFA 10000 UNIT/ML IJ SOLN
4000.0000 [IU] | Freq: Once | INTRAMUSCULAR | Status: AC
  Administered 2019-08-31: 4000 [IU] via INTRAVENOUS
  Filled 2019-08-31: qty 1

## 2019-08-31 NOTE — Progress Notes (Signed)
This note also relates to the following rows which could not be included: Pulse Rate - Cannot attach notes to unvalidated device data Resp - Cannot attach notes to unvalidated device data BP - Cannot attach notes to unvalidated device data  Hd completed  

## 2019-08-31 NOTE — Progress Notes (Signed)
Hd started  

## 2019-08-31 NOTE — Progress Notes (Signed)
[employer], [place] 08/31/19  Subjective:   LOS: 1 04/30 0701 - 05/01 0700 In: -  Out: 500   Patient is admitted for initiation of hemodialysis Had her first treatment yesterday.  Tolerated well. 500 cc of fluid was removed She is able to eat without nausea or vomiting Still has some lower extremity edema  Objective:  Vital signs in last 24 hours:  Temp:  [97.7 F (36.5 C)-98.6 F (37 C)] 98 F (36.7 C) (05/01 0804) Pulse Rate:  [58-92] 60 (05/01 0804) Resp:  [18-29] 18 (05/01 0804) BP: (158-175)/(58-86) 162/68 (05/01 0804) SpO2:  [84 %-99 %] 95 % (05/01 0804) Weight:  [65.9 kg-66 kg] 65.9 kg (05/01 0500)  Weight change:  Filed Weights   08/30/19 1956 08/31/19 0500  Weight: 66 kg 65.9 kg    Intake/Output:    Intake/Output Summary (Last 24 hours) at 08/31/2019 0841 Last data filed at 08/30/2019 2222 Gross per 24 hour  Intake --  Output 500 ml  Net -500 ml     Physical Exam: General:  No acute distress, laying in the bed  HEENT  anicteric, moist oral mucous membranes  Pulm/lungs  normal breathing effort, mild basilar crackles  CVS/Heart  2/6 murmur, regular  Abdomen:   Soft, nontender  Extremities:  2+ pitting edema bilaterally  Neurologic:  Alert, oriented, able to answer questions appropriately  Skin:  No acute rashes  Access:  Right IJ PermCath       Basic Metabolic Panel:  Recent Labs  Lab 08/27/19 1214 08/30/19 1900 08/31/19 0654  NA 142  --  143  K 3.7  --  3.6  CL 107  --  108  CO2 24  --  28  GLUCOSE 137*  --  95  BUN 81*  --  51*  CREATININE 4.14* 3.84* 2.97*  CALCIUM 9.1  --  8.7*     CBC: Recent Labs  Lab 08/27/19 1214 08/30/19 1900  WBC 5.3 7.0  NEUTROABS 4.2  --   HGB 6.9* 8.3*  HCT 22.8* 25.8*  MCV 86.0 84.6  PLT 202 229      Lab Results  Component Value Date   HEPBSAG NON REACTIVE 06/07/2019   HEPBSAB NON REACTIVE 06/07/2019   HEPBIGM NON REACTIVE 06/07/2019       Microbiology:  Recent Results (from the past 240 hour(s))  MRSA PCR Screening     Status: None   Collection Time: 08/30/19  7:31 PM   Specimen: Nasopharyngeal Swab  Result Value Ref Range Status   MRSA by PCR NEGATIVE NEGATIVE Final    Comment:        The GeneXpert MRSA Assay (FDA approved for NASAL specimens only), is one component of a comprehensive MRSA colonization surveillance program. It is not intended to diagnose MRSA infection nor to guide or monitor treatment for MRSA infections. Performed at [facility], [address] [place], [place] [address]   Respiratory Panel by RT PCR (Flu A&B, Covid) - Nasopharyngeal Swab     Status: None   Collection Time: 08/30/19  7:33 PM   Specimen: Nasopharyngeal Swab  Result Value Ref Range Status   SARS Coronavirus 2 by RT PCR NEGATIVE NEGATIVE Final    Comment: (NOTE) SARS-CoV-2 target nucleic acids are NOT DETECTED. The SARS-CoV-2 RNA is generally detectable in upper respiratoy specimens during the acute phase of infection. The lowest concentration of SARS-CoV-2 viral copies this assay can detect is 131 copies/mL. A negative result does not preclude  SARS-Cov-2 infection and should not be used as the sole basis for treatment or other patient management decisions. A negative result may occur with  improper specimen collection/handling, submission of specimen other than nasopharyngeal swab, presence of viral mutation(s) within the areas targeted by this assay, and inadequate number of viral copies (<131 copies/mL). A negative result must be combined with clinical observations, patient history, and epidemiological information. The expected result is Negative. Fact Sheet for Patients:  PinkCheek.be Fact Sheet for Healthcare Providers:  GravelBags.it This test is not yet ap proved or cleared by the [place] FDA and  has been authorized for detection  and/or diagnosis of SARS-CoV-2 by FDA under an Emergency Use Authorization (EUA). This EUA will remain  in effect (meaning this test can be used) for the duration of the COVID-19 declaration under Section 564(b)(1) of the Act, 21 U.S.C. section 360bbb-3(b)(1), unless the authorization is terminated or revoked sooner.    Influenza A by PCR NEGATIVE NEGATIVE Final   Influenza B by PCR NEGATIVE NEGATIVE Final    Comment: (NOTE) The Xpert Xpress SARS-CoV-2/FLU/RSV assay is intended as an aid in  the diagnosis of influenza from Nasopharyngeal swab specimens and  should not be used as a sole basis for treatment. Nasal washings and  aspirates are unacceptable for Xpert Xpress SARS-CoV-2/FLU/RSV  testing. Fact Sheet for Patients: PinkCheek.be Fact Sheet for Healthcare Providers: GravelBags.it This test is not yet approved or cleared by the [place] FDA and  has been authorized for detection and/or diagnosis of SARS-CoV-2 by  FDA under an Emergency Use Authorization (EUA). This EUA will remain  in effect (meaning this test can be used) for the duration of the  Covid-19 declaration under Section 564(b)(1) of the Act, 21  U.S.C. section 360bbb-3(b)(1), unless the authorization is  terminated or revoked. Performed at [facility], [address] [place], [place] [address]     Coagulation Studies: No results for input(s): LABPROT, INR in the last 72 hours.  Urinalysis: No results for input(s): COLORURINE, LABSPEC, PHURINE, GLUCOSEU, HGBUR, BILIRUBINUR, KETONESUR, PROTEINUR, UROBILINOGEN, NITRITE, LEUKOCYTESUR in the last 72 hours.  Invalid input(s): APPERANCEUR    Imaging: DG Chest 2 View  Result Date: 08/30/2019 CLINICAL DATA:  Shortness of breath for 1 week, TB screening EXAM: CHEST - 2 VIEW COMPARISON:  06/13/2019 FINDINGS: Cardiac shadow is enlarged but stable. Aortic calcifications are noted. Dialysis catheter  is again seen and stable. Lungs are well aerated bilaterally. Vascular congestion is seen. Chronic scarring in the left base is noted. No bony abnormality is noted. IMPRESSION: Stable vascular congestion similar to that seen on the prior exam. No acute abnormality is noted. Electronically Signed   By: Inez Catalina M.D.   On: 08/30/2019 19:08     Medications:   . sodium chloride     . amLODipine  5 mg Oral Daily  . aspirin EC  81 mg Oral Daily  . brimonidine  1 drop Both Eyes BID  . carvedilol  25 mg Oral BID  . Chlorhexidine Gluconate Cloth  6 each Topical Q0600  . dorzolamide-timolol  1 drop Both Eyes BID  . furosemide  80 mg Intravenous Q12H  . heparin  5,000 Units Subcutaneous Q8H  . insulin aspart  0-6 Units Subcutaneous TID WC  . irbesartan  75 mg Oral Daily  . latanoprost  1 drop Left Eye QHS  . loratadine  10 mg Oral Daily  . olopatadine  1 drop Both Eyes BID  . sertraline  25  mg Oral Daily  . sodium chloride flush  3 mL Intravenous Q12H  . traZODone  150 mg Oral QHS   sodium chloride, sodium chloride flush  Assessment/ Plan:  86 y.o. female with  Chronic kidney disease Congestive heart failure Diabetes with complications of CKD, insulin-dependent Coronary disease/history of MI History of knee surgery  Active Problems:   Essential hypertension, malignant   Glaucoma   Diabetes (HCC)   Anemia of chronic disease   Type 2 diabetes mellitus with hyperlipidemia (HCC)   Respiratory failure (HCC)   ESRD (end stage renal disease) (HCC)   Volume overload   #. ESRD with lower extremity edema  second hemodialysis treatment planned for today Also plan for volume removal as tolerated Next hemodialysis will be planned for Monday Outpatient discharge planning for [facility] unit  #. Anemia of CKD  Lab Results  Component Value Date   HGB 8.3 (L) 08/30/2019   Low dose EPO with HD  #. SHPTH     Component Value Date/Time   PTH 148 (H) 06/07/2019 1194   Lab Results   Component Value Date   PHOS 4.0 06/12/2019   Monitor calcium and phos level during this admission    #. Diabetes type 2 with CKD Hemoglobin A1C (%)  Date Value  07/23/2019 6.0 (A)  05/13/2013 8.5 (H)   Hgb A1c MFr Bld (%)  Date Value  04/10/2019 6.0 (H)  Insulin-dependent   LOS: [place] 5/1/20218:41 AM  [facility], [place]

## 2019-08-31 NOTE — Progress Notes (Signed)
Progress Note    Lori Stanley  [id number]:[id number] DOB: Sep 23, 1931  DOA: 08/30/2019 PCP: Jerrol Banana., MD      Brief Narrative:    Medical records reviewed and are as summarized below:  Lori Stanley is an 86 y.o. female  with medical history significant for esrd not currently on RRT, HTN, dCHF, DM, glaucoma, chronic anemia (with recent blood transfusion for hemoglobin of 6.9 on 08/27/2019), chronic respiratory failure, who presented with shortness of breath.       Assessment/Plan:   Active Problems:   Essential hypertension, malignant   Glaucoma   Diabetes ([place])   Anemia of chronic disease   Type 2 diabetes mellitus with hyperlipidemia (HCC)   Respiratory failure (HCC)   ESRD (end stage renal disease) (HCC)   Volume overload   Acute hypoxemic respiratory failure: Continue oxygen via nasal cannula and taper off as able  ESRD with volume overload/acute on chronic diastolic CHF: Patient had dialysis today. Plan for repeat hemodialysis on 09/02/2019.  Follow-up with nephrologist.  Hypertension: Continue antihypertensives  Type II DM: Diet controlled: NovoLog.  For hyperglycemia.  Anemia of chronic disease: H&H is stable.  Glaucoma: Continue medications.  Anxiety: Continue anxiolytics.    Body mass index is 25.74 kg/m.   Family Communication/Anticipated D/C date and plan/Code Status   DVT prophylaxis: Heparin Code Status: Full code Family Communication: Plan discussed with patient Disposition Plan:    Status is: Inpatient  Remains inpatient appropriate because:IV treatments appropriate due to intensity of illness or inability to take PO and Inpatient level of care appropriate due to severity of illness   Dispo: The patient is from: Home              Anticipated d/c is to: Home              Anticipated d/c date is: 2 days              Patient currently is not medically stable to d/c.            Subjective:   No complaints.   No shortness of breath or chest pain.  Objective:    Vitals:   08/31/19 1330 08/31/19 1345 08/31/19 1400 08/31/19 1429  BP: (!) 141/61 (!) 144/58 (!) 165/67 (!) 156/71  Pulse: (!) 50 (!) 51 (!) 52 (!) 57  Resp: 18 17 18 18   Temp:   97.8 F (36.6 C) 97.9 F (36.6 C)  TempSrc:   Oral Oral  SpO2:    100%  Weight:      Height:       No data found.   Intake/Output Summary (Last 24 hours) at 08/31/2019 1603 Last data filed at 08/31/2019 1400 Gross per 24 hour  Intake --  Output 2000 ml  Net -2000 ml   Filed Weights   08/30/19 1956 08/31/19 0500  Weight: 66 kg 65.9 kg    Exam:  GEN: NAD SKIN: No rash EYES: EOMI ENT: MMM CV: RRR PULM: CTA B ABD: soft, ND, NT, +BS CNS: AAO x 3, non focal EXT: B/l leg edema, no tenderness   Data Reviewed:   I have personally reviewed following labs and imaging studies:  Labs: Labs show the following:   Basic Metabolic Panel: Recent Labs  Lab 08/27/19 1214 08/30/19 1900 08/31/19 0654  NA 142  --  143  K 3.7  --  3.6  CL 107  --  108  CO2 24  --  28  GLUCOSE 137*  --  95  BUN 81*  --  51*  CREATININE 4.14* 3.84* 2.97*  CALCIUM 9.1  --  8.7*   GFR Estimated Creatinine Clearance: 12.6 mL/min (A) (by C-G formula based on SCr of 2.97 mg/dL (H)). Liver Function Tests: Recent Labs  Lab 08/27/19 1214 08/31/19 0654  AST 14* 14*  ALT 16 15  ALKPHOS 63 52  BILITOT 0.5 0.6  PROT 6.2* 5.6*  ALBUMIN 3.5 3.2*   No results for input(s): LIPASE, AMYLASE in the last 168 hours. No results for input(s): AMMONIA in the last 168 hours. Coagulation profile No results for input(s): INR, PROTIME in the last 168 hours.  CBC: Recent Labs  Lab 08/27/19 1214 08/30/19 1900 08/31/19 0654  WBC 5.3 7.0 5.5  NEUTROABS 4.2  --   --   HGB 6.9* 8.3* 7.6*  HCT 22.8* 25.8* 25.1*  MCV 86.0 84.6 87.2  PLT 202 229 211   Cardiac Enzymes: No results for input(s): CKTOTAL, CKMB, CKMBINDEX, TROPONINI in the last 168 hours. BNP (last 3  results) No results for input(s): PROBNP in the last 8760 hours. CBG: Recent Labs  Lab 08/30/19 1919 08/31/19 0902  GLUCAP 161* 89   D-Dimer: No results for input(s): DDIMER in the last 72 hours. Hgb A1c: No results for input(s): HGBA1C in the last 72 hours. Lipid Profile: No results for input(s): CHOL, HDL, LDLCALC, TRIG, CHOLHDL, LDLDIRECT in the last 72 hours. Thyroid function studies: No results for input(s): TSH, T4TOTAL, T3FREE, THYROIDAB in the last 72 hours.  Invalid input(s): FREET3 Anemia work up: No results for input(s): VITAMINB12, FOLATE, FERRITIN, TIBC, IRON, RETICCTPCT in the last 72 hours. Sepsis Labs: Recent Labs  Lab 08/27/19 1214 08/30/19 1900 08/31/19 0654  WBC 5.3 7.0 5.5    Microbiology Recent Results (from the past 240 hour(s))  MRSA PCR Screening     Status: None   Collection Time: 08/30/19  7:31 PM   Specimen: Nasopharyngeal Swab  Result Value Ref Range Status   MRSA by PCR NEGATIVE NEGATIVE Final    Comment:        The GeneXpert MRSA Assay (FDA approved for NASAL specimens only), is one component of a comprehensive MRSA colonization surveillance program. It is not intended to diagnose MRSA infection nor to guide or monitor treatment for MRSA infections. Performed at [facility], [address] [place], [place] [address]   Respiratory Panel by RT PCR (Flu A&B, Covid) - Nasopharyngeal Swab     Status: None   Collection Time: 08/30/19  7:33 PM   Specimen: Nasopharyngeal Swab  Result Value Ref Range Status   SARS Coronavirus 2 by RT PCR NEGATIVE NEGATIVE Final    Comment: (NOTE) SARS-CoV-2 target nucleic acids are NOT DETECTED. The SARS-CoV-2 RNA is generally detectable in upper respiratoy specimens during the acute phase of infection. The lowest concentration of SARS-CoV-2 viral copies this assay can detect is 131 copies/mL. A negative result does not preclude SARS-Cov-2 infection and should not be used as the sole basis  for treatment or other patient management decisions. A negative result may occur with  improper specimen collection/handling, submission of specimen other than nasopharyngeal swab, presence of viral mutation(s) within the areas targeted by this assay, and inadequate number of viral copies (<131 copies/mL). A negative result must be combined with clinical observations, patient history, and epidemiological information. The expected result is Negative. Fact Sheet for Patients:  PinkCheek.be Fact Sheet for Healthcare Providers:  GravelBags.it This test is  not yet ap proved or cleared by the [place] and  has been authorized for detection and/or diagnosis of SARS-CoV-2 by FDA under an Emergency Use Authorization (EUA). This EUA will remain  in effect (meaning this test can be used) for the duration of the COVID-19 declaration under Section 564(b)(1) of the Act, 21 U.S.C. section 360bbb-3(b)(1), unless the authorization is terminated or revoked sooner.    Influenza A by PCR NEGATIVE NEGATIVE Final   Influenza B by PCR NEGATIVE NEGATIVE Final    Comment: (NOTE) The Xpert Xpress SARS-CoV-2/FLU/RSV assay is intended as an aid in  the diagnosis of influenza from Nasopharyngeal swab specimens and  should not be used as a sole basis for treatment. Nasal washings and  aspirates are unacceptable for Xpert Xpress SARS-CoV-2/FLU/RSV  testing. Fact Sheet for Patients: PinkCheek.be Fact Sheet for Healthcare Providers: GravelBags.it This test is not yet approved or cleared by the [place] FDA and  has been authorized for detection and/or diagnosis of SARS-CoV-2 by  FDA under an Emergency Use Authorization (EUA). This EUA will remain  in effect (meaning this test can be used) for the duration of the  Covid-19 declaration under Section 564(b)(1) of the Act, 21  U.S.C.  section 360bbb-3(b)(1), unless the authorization is  terminated or revoked. Performed at [facility], [address] [place], [place] [address]     Procedures and diagnostic studies:  DG Chest 2 View  Result Date: 08/30/2019 CLINICAL DATA:  Shortness of breath for 1 week, TB screening EXAM: CHEST - 2 VIEW COMPARISON:  06/13/2019 FINDINGS: Cardiac shadow is enlarged but stable. Aortic calcifications are noted. Dialysis catheter is again seen and stable. Lungs are well aerated bilaterally. Vascular congestion is seen. Chronic scarring in the left base is noted. No bony abnormality is noted. IMPRESSION: Stable vascular congestion similar to that seen on the prior exam. No acute abnormality is noted. Electronically Signed   By: Inez Catalina M.D.   On: 08/30/2019 19:08    Medications:   . amLODipine  5 mg Oral Daily  . aspirin EC  81 mg Oral Daily  . brimonidine  1 drop Both Eyes BID  . carvedilol  25 mg Oral BID  . Chlorhexidine Gluconate Cloth  6 each Topical Q0600  . dorzolamide-timolol  1 drop Both Eyes BID  . furosemide  80 mg Intravenous Q12H  . heparin  5,000 Units Subcutaneous Q8H  . insulin aspart  0-6 Units Subcutaneous TID WC  . irbesartan  75 mg Oral Daily  . latanoprost  1 drop Left Eye QHS  . loratadine  10 mg Oral Daily  . olopatadine  1 drop Both Eyes BID  . sertraline  25 mg Oral Daily  . sodium chloride flush  3 mL Intravenous Q12H  . traZODone  150 mg Oral QHS   Continuous Infusions: . sodium chloride       LOS: 1 day   Yarrow Linhart  [facility]     08/31/2019, 4:03 PM

## 2019-09-01 DIAGNOSIS — N186 End stage renal disease: Secondary | ICD-10-CM | POA: Diagnosis not present

## 2019-09-01 DIAGNOSIS — I10 Essential (primary) hypertension: Secondary | ICD-10-CM

## 2019-09-01 DIAGNOSIS — D638 Anemia in other chronic diseases classified elsewhere: Secondary | ICD-10-CM | POA: Diagnosis not present

## 2019-09-01 DIAGNOSIS — E8779 Other fluid overload: Secondary | ICD-10-CM | POA: Diagnosis not present

## 2019-09-01 LAB — GLUCOSE, CAPILLARY
Glucose-Capillary: 87 mg/dL (ref 70–99)
Glucose-Capillary: 129 mg/dL — ABNORMAL HIGH (ref 70–99)
Glucose-Capillary: 130 mg/dL — ABNORMAL HIGH (ref 70–99)
Glucose-Capillary: 111 mg/dL — ABNORMAL HIGH (ref 70–99)

## 2019-09-01 NOTE — Progress Notes (Signed)
Progress Note    Lori Stanley  UTM:[id number] DOB: 01-Jan-1932  DOA: 08/30/2019 PCP: Jerrol Banana., MD      Brief Narrative:    Medical records reviewed and are as summarized below:  Lori Stanley is an 86 y.o. female  with medical history significant for esrd not currently on RRT, HTN, dCHF, DM, glaucoma, chronic anemia (with recent blood transfusion for hemoglobin of 6.9 on 08/27/2019), chronic respiratory failure, who presented with shortness of breath.       Assessment/Plan:   Active Problems:   Essential hypertension, malignant   Glaucoma   Diabetes ([place])   Anemia of chronic disease   Type 2 diabetes mellitus with hyperlipidemia (HCC)   Respiratory failure (HCC)   ESRD (end stage renal disease) (HCC)   Volume overload   Acute hypoxemic respiratory failure: Continue oxygen via nasal cannula and taper off as able  ESRD with volume overload/acute on chronic diastolic CHF: Patient had dialysis on 08/31/2019. Plan for repeat hemodialysis on 09/02/2019.  Continue IV Lasix.  Follow-up with nephrologist.  Hypertension: Continue antihypertensives  Type II DM: Diet controlled: NovoLog.  For hyperglycemia.  Anemia of chronic disease: H&H is stable.  Glaucoma: Continue medications.  Anxiety: Continue anxiolytics.    Body mass index is 25.38 kg/m.   Family Communication/Anticipated D/C date and plan/Code Status   DVT prophylaxis: Heparin Code Status: Full code Family Communication: Plan discussed with patient Disposition Plan:    Status is: Inpatient  Remains inpatient appropriate because:IV treatments appropriate due to intensity of illness or inability to take PO and Inpatient level of care appropriate due to severity of illness   Dispo: The patient is from: Home              Anticipated d/c is to: Home              Anticipated d/c date is: 2 days              Patient currently is not medically stable to  d/c.            Subjective:   No complaints.  No shortness of breath or chest pain.  Objective:    Vitals:   08/31/19 1429 08/31/19 2040 09/01/19 0601 09/01/19 1149  BP: (!) 156/71 (!) 153/72 (!) 149/62 (!) 154/67  Pulse: (!) 57 70 63 66  Resp: 18 20 20    Temp: 97.9 F (36.6 C) 98.4 F (36.9 C) 98.3 F (36.8 C) 98.8 F (37.1 C)  TempSrc: Oral Oral Oral Oral  SpO2: 100% 96% 97% 100%  Weight:   65 kg   Height:       No data found.   Intake/Output Summary (Last 24 hours) at 09/01/2019 1348 Last data filed at 09/01/2019 5681 Gross per 24 hour  Intake 0 ml  Output 1500 ml  Net -1500 ml   Filed Weights   08/30/19 1956 08/31/19 0500 09/01/19 0601  Weight: 66 kg 65.9 kg 65 kg    Exam:  GEN: NAD SKIN: No rash EYES: EOMI ENT: MMM CV: RRR PULM: mild bibasilar rales ABD: soft, ND, NT, +BS CNS: AAO x 3, non focal EXT: B/l leg edema, no tenderness   Data Reviewed:   I have personally reviewed following labs and imaging studies:  Labs: Labs show the following:   Basic Metabolic Panel: Recent Labs  Lab 08/27/19 1214 08/30/19 1900 08/31/19 0654  NA 142  --  143  K 3.7  --  3.6  CL 107  --  108  CO2 24  --  28  GLUCOSE 137*  --  95  BUN 81*  --  51*  CREATININE 4.14* 3.84* 2.97*  CALCIUM 9.1  --  8.7*   GFR Estimated Creatinine Clearance: 12.5 mL/min (A) (by C-G formula based on SCr of 2.97 mg/dL (H)). Liver Function Tests: Recent Labs  Lab 08/27/19 1214 08/31/19 0654  AST 14* 14*  ALT 16 15  ALKPHOS 63 52  BILITOT 0.5 0.6  PROT 6.2* 5.6*  ALBUMIN 3.5 3.2*   No results for input(s): LIPASE, AMYLASE in the last 168 hours. No results for input(s): AMMONIA in the last 168 hours. Coagulation profile No results for input(s): INR, PROTIME in the last 168 hours.  CBC: Recent Labs  Lab 08/27/19 1214 08/30/19 1900 08/31/19 0654  WBC 5.3 7.0 5.5  NEUTROABS 4.2  --   --   HGB 6.9* 8.3* 7.6*  HCT 22.8* 25.8* 25.1*  MCV 86.0 84.6 87.2   PLT 202 229 211   Cardiac Enzymes: No results for input(s): CKTOTAL, CKMB, CKMBINDEX, TROPONINI in the last 168 hours. BNP (last 3 results) No results for input(s): PROBNP in the last 8760 hours. CBG: Recent Labs  Lab 08/31/19 0902 08/31/19 1642 08/31/19 2135 09/01/19 0751 09/01/19 1148  GLUCAP 89 167* 110* 87 129*   D-Dimer: No results for input(s): DDIMER in the last 72 hours. Hgb A1c: No results for input(s): HGBA1C in the last 72 hours. Lipid Profile: No results for input(s): CHOL, HDL, LDLCALC, TRIG, CHOLHDL, LDLDIRECT in the last 72 hours. Thyroid function studies: No results for input(s): TSH, T4TOTAL, T3FREE, THYROIDAB in the last 72 hours.  Invalid input(s): FREET3 Anemia work up: No results for input(s): VITAMINB12, FOLATE, FERRITIN, TIBC, IRON, RETICCTPCT in the last 72 hours. Sepsis Labs: Recent Labs  Lab 08/27/19 1214 08/30/19 1900 08/31/19 0654  WBC 5.3 7.0 5.5    Microbiology Recent Results (from the past 240 hour(s))  MRSA PCR Screening     Status: None   Collection Time: 08/30/19  7:31 PM   Specimen: Nasopharyngeal Swab  Result Value Ref Range Status   MRSA by PCR NEGATIVE NEGATIVE Final    Comment:        The GeneXpert MRSA Assay (FDA approved for NASAL specimens only), is one component of a comprehensive MRSA colonization surveillance program. It is not intended to diagnose MRSA infection nor to guide or monitor treatment for MRSA infections. Performed at [facility], [address] [place], [place] [address]   Respiratory Panel by RT PCR (Flu A&B, Covid) - Nasopharyngeal Swab     Status: None   Collection Time: 08/30/19  7:33 PM   Specimen: Nasopharyngeal Swab  Result Value Ref Range Status   SARS Coronavirus 2 by RT PCR NEGATIVE NEGATIVE Final    Comment: (NOTE) SARS-CoV-2 target nucleic acids are NOT DETECTED. The SARS-CoV-2 RNA is generally detectable in upper respiratoy specimens during the acute phase of infection.  The lowest concentration of SARS-CoV-2 viral copies this assay can detect is 131 copies/mL. A negative result does not preclude SARS-Cov-2 infection and should not be used as the sole basis for treatment or other patient management decisions. A negative result may occur with  improper specimen collection/handling, submission of specimen other than nasopharyngeal swab, presence of viral mutation(s) within the areas targeted by this assay, and inadequate number of viral copies (<131 copies/mL). A negative result must be combined with clinical observations, patient history, and  epidemiological information. The expected result is Negative. Fact Sheet for Patients:  PinkCheek.be Fact Sheet for Healthcare Providers:  GravelBags.it This test is not yet ap proved or cleared by the [place] FDA and  has been authorized for detection and/or diagnosis of SARS-CoV-2 by FDA under an Emergency Use Authorization (EUA). This EUA will remain  in effect (meaning this test can be used) for the duration of the COVID-19 declaration under Section 564(b)(1) of the Act, 21 U.S.C. section 360bbb-3(b)(1), unless the authorization is terminated or revoked sooner.    Influenza A by PCR NEGATIVE NEGATIVE Final   Influenza B by PCR NEGATIVE NEGATIVE Final    Comment: (NOTE) The Xpert Xpress SARS-CoV-2/FLU/RSV assay is intended as an aid in  the diagnosis of influenza from Nasopharyngeal swab specimens and  should not be used as a sole basis for treatment. Nasal washings and  aspirates are unacceptable for Xpert Xpress SARS-CoV-2/FLU/RSV  testing. Fact Sheet for Patients: PinkCheek.be Fact Sheet for Healthcare Providers: GravelBags.it This test is not yet approved or cleared by the [place] FDA and  has been authorized for detection and/or diagnosis of SARS-CoV-2 by  FDA under an  Emergency Use Authorization (EUA). This EUA will remain  in effect (meaning this test can be used) for the duration of the  Covid-19 declaration under Section 564(b)(1) of the Act, 21  U.S.C. section 360bbb-3(b)(1), unless the authorization is  terminated or revoked. Performed at [facility], [address] [place], [place] [address]     Procedures and diagnostic studies:  DG Chest 2 View  Result Date: 08/30/2019 CLINICAL DATA:  Shortness of breath for 1 week, TB screening EXAM: CHEST - 2 VIEW COMPARISON:  06/13/2019 FINDINGS: Cardiac shadow is enlarged but stable. Aortic calcifications are noted. Dialysis catheter is again seen and stable. Lungs are well aerated bilaterally. Vascular congestion is seen. Chronic scarring in the left base is noted. No bony abnormality is noted. IMPRESSION: Stable vascular congestion similar to that seen on the prior exam. No acute abnormality is noted. Electronically Signed   By: Inez Catalina M.D.   On: 08/30/2019 19:08    Medications:   . amLODipine  5 mg Oral Daily  . aspirin EC  81 mg Oral Daily  . brimonidine  1 drop Both Eyes BID  . carvedilol  25 mg Oral BID  . Chlorhexidine Gluconate Cloth  6 each Topical Q0600  . dorzolamide-timolol  1 drop Both Eyes BID  . furosemide  80 mg Intravenous Q12H  . heparin  5,000 Units Subcutaneous Q8H  . insulin aspart  0-6 Units Subcutaneous TID WC  . irbesartan  75 mg Oral Daily  . latanoprost  1 drop Left Eye QHS  . loratadine  10 mg Oral Daily  . olopatadine  1 drop Both Eyes BID  . sertraline  25 mg Oral Daily  . sodium chloride flush  3 mL Intravenous Q12H  . traZODone  150 mg Oral QHS   Continuous Infusions: . sodium chloride       LOS: 2 days   Fujie Dickison  [facility]     09/01/2019, 1:48 PM

## 2019-09-01 NOTE — Progress Notes (Signed)
[facility], [place] 09/01/19  Subjective:   LOS: 2 05/01 0701 - 05/02 0700 In: 0  Out: 1500   Patient is admitted for initiation of hemodialysis Tolerating well so far Able to eat without nausea or vomiting.  Seen today this morning today while eating breakfast Total of 2000 cc has been removed with dialysis so far Lower extremity edema appears to be improving  Objective:  Vital signs in last 24 hours:  Temp:  [97.8 F (36.6 C)-98.8 F (37.1 C)] 98.8 F (37.1 C) (05/02 1149) Pulse Rate:  [50-70] 66 (05/02 1149) Resp:  [15-20] 20 (05/02 0601) BP: (141-165)/(52-72) 154/67 (05/02 1149) SpO2:  [96 %-100 %] 100 % (05/02 1149) Weight:  [65 kg] 65 kg (05/02 0601)  Weight change: -1 kg Filed Weights   08/30/19 1956 08/31/19 0500 09/01/19 0601  Weight: 66 kg 65.9 kg 65 kg    Intake/Output:    Intake/Output Summary (Last 24 hours) at 09/01/2019 1153 Last data filed at 09/01/2019 0639 Gross per 24 hour  Intake 0 ml  Output 1500 ml  Net -1500 ml     Physical Exam: General:  No acute distress, laying in the bed  HEENT  anicteric, moist oral mucous membranes  Pulm/lungs  normal breathing effort, mild basilar crackles  CVS/Heart  2/6 murmur, regular  Abdomen:   Soft, nontender  Extremities:  1-2+ pitting edema bilaterally  Neurologic:  Alert, oriented, able to answer questions appropriately  Skin:  No acute rashes  Access:  Right IJ PermCath       Basic Metabolic Panel:  Recent Labs  Lab 08/27/19 1214 08/30/19 1900 08/31/19 0654  NA 142  --  143  K 3.7  --  3.6  CL 107  --  108  CO2 24  --  28  GLUCOSE 137*  --  95  BUN 81*  --  51*  CREATININE 4.14* 3.84* 2.97*  CALCIUM 9.1  --  8.7*     CBC: Recent Labs  Lab 08/27/19 1214 08/30/19 1900 08/31/19 0654  WBC 5.3 7.0 5.5  NEUTROABS 4.2  --   --   HGB 6.9* 8.3* 7.6*  HCT 22.8* 25.8* 25.1*  MCV 86.0 84.6 87.2  PLT 202 229 211      Lab Results  Component Value Date   HEPBSAG NON REACTIVE 08/30/2019   HEPBSAB NON REACTIVE 08/30/2019   HEPBIGM NON REACTIVE 06/07/2019      Microbiology:  Recent Results (from the past 240 hour(s))  MRSA PCR Screening     Status: None   Collection Time: 08/30/19  7:31 PM   Specimen: Nasopharyngeal Swab  Result Value Ref Range Status   MRSA by PCR NEGATIVE NEGATIVE Final    Comment:        The GeneXpert MRSA Assay (FDA approved for NASAL specimens only), is one component of a comprehensive MRSA colonization surveillance program. It is not intended to diagnose MRSA infection nor to guide or monitor treatment for MRSA infections. Performed at [facility], [address] [place], [place] [address]   Respiratory Panel by RT PCR (Flu A&B, Covid) - Nasopharyngeal Swab     Status: None   Collection Time: 08/30/19  7:33 PM   Specimen: Nasopharyngeal Swab  Result Value Ref Range Status   SARS Coronavirus 2 by RT PCR NEGATIVE NEGATIVE Final    Comment: (NOTE) SARS-CoV-2 target nucleic acids are NOT DETECTED. The SARS-CoV-2 RNA is generally detectable in upper respiratoy specimens during the acute  phase of infection. The lowest concentration of SARS-CoV-2 viral copies this assay can detect is 131 copies/mL. A negative result does not preclude SARS-Cov-2 infection and should not be used as the sole basis for treatment or other patient management decisions. A negative result may occur with  improper specimen collection/handling, submission of specimen other than nasopharyngeal swab, presence of viral mutation(s) within the areas targeted by this assay, and inadequate number of viral copies (<131 copies/mL). A negative result must be combined with clinical observations, patient history, and epidemiological information. The expected result is Negative. Fact Sheet for Patients:  PinkCheek.be Fact Sheet for Healthcare Providers:  GravelBags.it This  test is not yet ap proved or cleared by the [place] FDA and  has been authorized for detection and/or diagnosis of SARS-CoV-2 by FDA under an Emergency Use Authorization (EUA). This EUA will remain  in effect (meaning this test can be used) for the duration of the COVID-19 declaration under Section 564(b)(1) of the Act, 21 U.S.C. section 360bbb-3(b)(1), unless the authorization is terminated or revoked sooner.    Influenza A by PCR NEGATIVE NEGATIVE Final   Influenza B by PCR NEGATIVE NEGATIVE Final    Comment: (NOTE) The Xpert Xpress SARS-CoV-2/FLU/RSV assay is intended as an aid in  the diagnosis of influenza from Nasopharyngeal swab specimens and  should not be used as a sole basis for treatment. Nasal washings and  aspirates are unacceptable for Xpert Xpress SARS-CoV-2/FLU/RSV  testing. Fact Sheet for Patients: PinkCheek.be Fact Sheet for Healthcare Providers: GravelBags.it This test is not yet approved or cleared by the [place] FDA and  has been authorized for detection and/or diagnosis of SARS-CoV-2 by  FDA under an Emergency Use Authorization (EUA). This EUA will remain  in effect (meaning this test can be used) for the duration of the  Covid-19 declaration under Section 564(b)(1) of the Act, 21  U.S.C. section 360bbb-3(b)(1), unless the authorization is  terminated or revoked. Performed at [facility], [address] [place], [place] [address]     Coagulation Studies: No results for input(s): LABPROT, INR in the last 72 hours.  Urinalysis: No results for input(s): COLORURINE, LABSPEC, PHURINE, GLUCOSEU, HGBUR, BILIRUBINUR, KETONESUR, PROTEINUR, UROBILINOGEN, NITRITE, LEUKOCYTESUR in the last 72 hours.  Invalid input(s): APPERANCEUR    Imaging: DG Chest 2 View  Result Date: 08/30/2019 CLINICAL DATA:  Shortness of breath for 1 week, TB screening EXAM: CHEST - 2 VIEW COMPARISON:   06/13/2019 FINDINGS: Cardiac shadow is enlarged but stable. Aortic calcifications are noted. Dialysis catheter is again seen and stable. Lungs are well aerated bilaterally. Vascular congestion is seen. Chronic scarring in the left base is noted. No bony abnormality is noted. IMPRESSION: Stable vascular congestion similar to that seen on the prior exam. No acute abnormality is noted. Electronically Signed   By: Inez Catalina M.D.   On: 08/30/2019 19:08     Medications:   . sodium chloride     . amLODipine  5 mg Oral Daily  . aspirin EC  81 mg Oral Daily  . brimonidine  1 drop Both Eyes BID  . carvedilol  25 mg Oral BID  . Chlorhexidine Gluconate Cloth  6 each Topical Q0600  . dorzolamide-timolol  1 drop Both Eyes BID  . furosemide  80 mg Intravenous Q12H  . heparin  5,000 Units Subcutaneous Q8H  . insulin aspart  0-6 Units Subcutaneous TID WC  . irbesartan  75 mg Oral Daily  . latanoprost  1 drop Left Eye  QHS  . loratadine  10 mg Oral Daily  . olopatadine  1 drop Both Eyes BID  . sertraline  25 mg Oral Daily  . sodium chloride flush  3 mL Intravenous Q12H  . traZODone  150 mg Oral QHS   sodium chloride, sodium chloride flush  Assessment/ Plan:  86 y.o. female with  Chronic kidney disease Congestive heart failure Diabetes with complications of CKD, insulin-dependent Coronary disease/history of MI History of knee surgery  Active Problems:   Essential hypertension, malignant   Glaucoma   Diabetes (HCC)   Anemia of chronic disease   Type 2 diabetes mellitus with hyperlipidemia (HCC)   Respiratory failure (HCC)   ESRD (end stage renal disease) (HCC)   Volume overload   #. ESRD with lower extremity edema  Next hemodialysis will be planned for Monday Outpatient discharge planning for [facility] unit  #. Anemia of CKD  Lab Results  Component Value Date   HGB 7.6 (L) 08/31/2019   Low dose EPO with HD  #. SHPTH     Component Value Date/Time   PTH 148 (H) 06/07/2019  2703   Lab Results  Component Value Date   PHOS 4.0 06/12/2019   Monitor calcium and phos level during this admission    #. Diabetes type 2 with CKD Hemoglobin A1C (%)  Date Value  07/23/2019 6.0 (A)  05/13/2013 8.5 (H)   Hgb A1c MFr Bld (%)  Date Value  04/10/2019 6.0 (H)  Insulin-dependent   LOS: [place] 5/2/202111:53 AM  [place], [place]

## 2019-09-02 ENCOUNTER — Other Ambulatory Visit (INDEPENDENT_AMBULATORY_CARE_PROVIDER_SITE_OTHER): Payer: Self-pay | Admitting: Nurse Practitioner

## 2019-09-02 DIAGNOSIS — J9621 Acute and chronic respiratory failure with hypoxia: Secondary | ICD-10-CM | POA: Diagnosis not present

## 2019-09-02 DIAGNOSIS — E785 Hyperlipidemia, unspecified: Secondary | ICD-10-CM

## 2019-09-02 DIAGNOSIS — N186 End stage renal disease: Secondary | ICD-10-CM | POA: Diagnosis not present

## 2019-09-02 DIAGNOSIS — E1169 Type 2 diabetes mellitus with other specified complication: Secondary | ICD-10-CM | POA: Diagnosis not present

## 2019-09-02 DIAGNOSIS — E8779 Other fluid overload: Secondary | ICD-10-CM | POA: Diagnosis not present

## 2019-09-02 LAB — BASIC METABOLIC PANEL
Anion gap: 7 (ref 5–15)
BUN: 40 mg/dL — ABNORMAL HIGH (ref 8–23)
CO2: 29 mmol/L (ref 22–32)
Calcium: 9.1 mg/dL (ref 8.9–10.3)
Chloride: 105 mmol/L (ref 98–111)
Creatinine, Ser: 3.16 mg/dL — ABNORMAL HIGH (ref 0.44–1.00)
GFR calc Af Amer: 15 mL/min — ABNORMAL LOW (ref >60)
GFR calc non Af Amer: 13 mL/min — ABNORMAL LOW (ref >60)
Glucose, Bld: 87 mg/dL (ref 70–99)
Potassium: 4.3 mmol/L (ref 3.5–5.1)
Sodium: 141 mmol/L (ref 135–145)

## 2019-09-02 LAB — CBC
HCT: 25.1 % — ABNORMAL LOW (ref 36.0–46.0)
Hemoglobin: 7.7 g/dL — ABNORMAL LOW (ref 12.0–15.0)
MCH: 26.5 pg (ref 26.0–34.0)
MCHC: 30.7 g/dL (ref 30.0–36.0)
MCV: 86.3 fL (ref 80.0–100.0)
Platelets: 218 10*3/uL (ref 150–400)
RBC: 2.91 MIL/uL — ABNORMAL LOW (ref 3.87–5.11)
RDW: 16 % — ABNORMAL HIGH (ref 11.5–15.5)
WBC: 5.8 10*3/uL (ref 4.0–10.5)
nRBC: 0 % (ref 0.0–0.2)

## 2019-09-02 LAB — GLUCOSE, CAPILLARY
Glucose-Capillary: 84 mg/dL (ref 70–99)
Glucose-Capillary: 137 mg/dL — ABNORMAL HIGH (ref 70–99)
Glucose-Capillary: 149 mg/dL — ABNORMAL HIGH (ref 70–99)
Glucose-Capillary: 151 mg/dL — ABNORMAL HIGH (ref 70–99)

## 2019-09-02 NOTE — Progress Notes (Signed)
[facility], [place] 09/02/19  Subjective:   LOS: 3 No intake/output data recorded.  Patient is admitted for initiation of hemodialysis Tolerating well so far Able to eat without nausea or vomiting.   Lower extremity edema appears to be improving   Objective:  Vital signs in last 24 hours:  Temp:  [98.1 F (36.7 C)-98.8 F (37.1 C)] 98.1 F (36.7 C) (05/03 0430) Pulse Rate:  [66-69] 67 (05/03 0430) Resp:  [18] 18 (05/03 0430) BP: (154-167)/(67-68) 167/68 (05/03 0430) SpO2:  [94 %-100 %] 94 % (05/03 0430) Weight:  [64.5 kg] 64.5 kg (05/03 0430)  Weight change: -0.5 kg Filed Weights   08/31/19 0500 09/01/19 0601 09/02/19 0430  Weight: 65.9 kg 65 kg 64.5 kg    Intake/Output:    Intake/Output Summary (Last 24 hours) at 09/02/2019 1126 Last data filed at 09/02/2019 1017 Gross per 24 hour  Intake 120 ml  Output 0 ml  Net 120 ml     Physical Exam: General:  No acute distress,   HEENT  anicteric, moist oral mucous membranes  Pulm/lungs  normal breathing effort, mild basilar crackles  CVS/Heart  2/6 murmur, regular  Abdomen:   Soft, nontender  Extremities:  1+ pitting edema bilaterally  Neurologic:  Alert, oriented, able to answer questions appropriately  Skin:  No acute rashes  Access:  Right IJ PermCath       Basic Metabolic Panel:  Recent Labs  Lab 08/27/19 1214 08/30/19 1900 08/31/19 0654 09/02/19 0459  NA 142  --  143 141  K 3.7  --  3.6 4.3  CL 107  --  108 105  CO2 24  --  28 29  GLUCOSE 137*  --  95 87  BUN 81*  --  51* 40*  CREATININE 4.14* 3.84* 2.97* 3.16*  CALCIUM 9.1  --  8.7* 9.1     CBC: Recent Labs  Lab 08/27/19 1214 08/30/19 1900 08/31/19 0654 09/02/19 0459  WBC 5.3 7.0 5.5 5.8  NEUTROABS 4.2  --   --   --   HGB 6.9* 8.3* 7.6* 7.7*  HCT 22.8* 25.8* 25.1* 25.1*  MCV 86.0 84.6 87.2 86.3  PLT 202 229 211 218      Lab Results  Component Value Date   HEPBSAG NON REACTIVE 08/30/2019   HEPBSAB NON  REACTIVE 08/30/2019   HEPBIGM NON REACTIVE 06/07/2019      Microbiology:  Recent Results (from the past 240 hour(s))  MRSA PCR Screening     Status: None   Collection Time: 08/30/19  7:31 PM   Specimen: Nasopharyngeal Swab  Result Value Ref Range Status   MRSA by PCR NEGATIVE NEGATIVE Final    Comment:        The GeneXpert MRSA Assay (FDA approved for NASAL specimens only), is one component of a comprehensive MRSA colonization surveillance program. It is not intended to diagnose MRSA infection nor to guide or monitor treatment for MRSA infections. Performed at [facility], [address] [place], [place] [address]   Respiratory Panel by RT PCR (Flu A&B, Covid) - Nasopharyngeal Swab     Status: None   Collection Time: 08/30/19  7:33 PM   Specimen: Nasopharyngeal Swab  Result Value Ref Range Status   SARS Coronavirus 2 by RT PCR NEGATIVE NEGATIVE Final    Comment: (NOTE) SARS-CoV-2 target nucleic acids are NOT DETECTED. The SARS-CoV-2 RNA is generally detectable in upper respiratoy specimens during the acute phase of infection. The lowest concentration  of SARS-CoV-2 viral copies this assay can detect is 131 copies/mL. A negative result does not preclude SARS-Cov-2 infection and should not be used as the sole basis for treatment or other patient management decisions. A negative result may occur with  improper specimen collection/handling, submission of specimen other than nasopharyngeal swab, presence of viral mutation(s) within the areas targeted by this assay, and inadequate number of viral copies (<131 copies/mL). A negative result must be combined with clinical observations, patient history, and epidemiological information. The expected result is Negative. Fact Sheet for Patients:  PinkCheek.be Fact Sheet for Healthcare Providers:  GravelBags.it This test is not yet ap proved or cleared by the  [place] FDA and  has been authorized for detection and/or diagnosis of SARS-CoV-2 by FDA under an Emergency Use Authorization (EUA). This EUA will remain  in effect (meaning this test can be used) for the duration of the COVID-19 declaration under Section 564(b)(1) of the Act, 21 U.S.C. section 360bbb-3(b)(1), unless the authorization is terminated or revoked sooner.    Influenza A by PCR NEGATIVE NEGATIVE Final   Influenza B by PCR NEGATIVE NEGATIVE Final    Comment: (NOTE) The Xpert Xpress SARS-CoV-2/FLU/RSV assay is intended as an aid in  the diagnosis of influenza from Nasopharyngeal swab specimens and  should not be used as a sole basis for treatment. Nasal washings and  aspirates are unacceptable for Xpert Xpress SARS-CoV-2/FLU/RSV  testing. Fact Sheet for Patients: PinkCheek.be Fact Sheet for Healthcare Providers: GravelBags.it This test is not yet approved or cleared by the [place] FDA and  has been authorized for detection and/or diagnosis of SARS-CoV-2 by  FDA under an Emergency Use Authorization (EUA). This EUA will remain  in effect (meaning this test can be used) for the duration of the  Covid-19 declaration under Section 564(b)(1) of the Act, 21  U.S.C. section 360bbb-3(b)(1), unless the authorization is  terminated or revoked. Performed at [facility], [address] [place], [place] [address]     Coagulation Studies: No results for input(s): LABPROT, INR in the last 72 hours.  Urinalysis: No results for input(s): COLORURINE, LABSPEC, PHURINE, GLUCOSEU, HGBUR, BILIRUBINUR, KETONESUR, PROTEINUR, UROBILINOGEN, NITRITE, LEUKOCYTESUR in the last 72 hours.  Invalid input(s): APPERANCEUR    Imaging: No results found.   Medications:   . sodium chloride     . amLODipine  5 mg Oral Daily  . aspirin EC  81 mg Oral Daily  . brimonidine  1 drop Both Eyes BID  . carvedilol  25 mg  Oral BID  . Chlorhexidine Gluconate Cloth  6 each Topical Q0600  . dorzolamide-timolol  1 drop Both Eyes BID  . furosemide  80 mg Intravenous Q12H  . heparin  5,000 Units Subcutaneous Q8H  . insulin aspart  0-6 Units Subcutaneous TID WC  . irbesartan  75 mg Oral Daily  . latanoprost  1 drop Left Eye QHS  . loratadine  10 mg Oral Daily  . olopatadine  1 drop Both Eyes BID  . sertraline  25 mg Oral Daily  . sodium chloride flush  3 mL Intravenous Q12H  . traZODone  150 mg Oral QHS   sodium chloride, sodium chloride flush  Assessment/ Plan:  86 y.o. female with  Chronic kidney disease Congestive heart failure Diabetes with complications of CKD, insulin-dependent Coronary disease/history of MI History of knee surgery  Active Problems:   Essential hypertension, malignant   Glaucoma   Diabetes (HCC)   Anemia of chronic disease  Type 2 diabetes mellitus with hyperlipidemia (HCC)   Respiratory failure (HCC)   ESRD (end stage renal disease) (HCC)   Volume overload   #. ESRD with lower extremity edema  HD today Outpatient discharge planning for [facility] unit Twice weekly Monday-Friday schedule Outpatient orders placed.  Next expected hemodialysis on Friday Discussed possibility of PD with patient.  She states that at her age, and her comorbidities, she does not feel comfortable doing treatments at home  #. Anemia of CKD  Lab Results  Component Value Date   HGB 7.7 (L) 09/02/2019   Low dose EPO with HD  #. SHPTH     Component Value Date/Time   PTH 148 (H) 06/07/2019 4193   Lab Results  Component Value Date   PHOS 4.0 06/12/2019   Monitor calcium and phos level during this admission    #. Diabetes type 2 with CKD Hemoglobin A1C (%)  Date Value  07/23/2019 6.0 (A)  05/13/2013 8.5 (H)   Hgb A1c MFr Bld (%)  Date Value  04/10/2019 6.0 (H)  Insulin-dependent   LOS: [place] 5/3/202111:25 AM  [facility] [place],  [place]

## 2019-09-02 NOTE — Care Management Important Message (Signed)
Important Message  Patient Details  Name: Lori Stanley MRN: [id number] Date of Birth: [birth year]   Medicare Important Message Given:  Yes     Dannette Barbara 09/02/2019, 12:29 PM

## 2019-09-02 NOTE — Progress Notes (Signed)
Progress Note    Lori Stanley  FAO:[id number] DOB: 04-20-1932  DOA: 08/30/2019 PCP: Jerrol Banana., MD      Brief Narrative:    Medical records reviewed and are as summarized below:  Lori Stanley is an 86 y.o. female  with medical history significant for esrd not currently on RRT, HTN, dCHF, DM, glaucoma, chronic anemia (with recent blood transfusion for hemoglobin of 6.9 on 08/27/2019), chronic respiratory failure, who presented with shortness of breath.       Assessment/Plan:   Active Problems:   Essential hypertension, malignant   Glaucoma   Diabetes ([place])   Anemia of chronic disease   Type 2 diabetes mellitus with hyperlipidemia (HCC)   Respiratory failure (HCC)   ESRD (end stage renal disease) (HCC)   Volume overload   Acute hypoxemic respiratory failure: Strain saturation dropped to 78% on room air today.  Continue oxygen via nasal cannula and taper off as able  ESRD with volume overload/acute on chronic diastolic CHF: Patient had dialysis on 08/31/2019.  2D echo in February 2021 showed EF estimated at 60 to 65%, moderately increased LVH, grade 2 diastolic dysfunction, mildly elevated pulmonary artery systolic pressure.  Continue IV Lasix because she still has volume overload.  Outpatient hemodialysis has been set up.  Follow-up with nephrologist.  Hypertension: Continue antihypertensives  Type II DM: Diet controlled: NovoLog prn for hyperglycemia.  Anemia of chronic disease: H&H is stable.  Glaucoma: Continue medications.  Anxiety: Continue anxiolytics.    Body mass index is 25.19 kg/m.   Family Communication/Anticipated D/C date and plan/Code Status   DVT prophylaxis: Heparin Code Status: Full code Family Communication: Plan discussed with patient Disposition Plan:    Status is: Inpatient  Remains inpatient appropriate because:IV treatments appropriate due to intensity of illness or inability to take PO and Inpatient level  of care appropriate due to severity of illness   Dispo: The patient is from: Home              Anticipated d/c is to: Home              Anticipated d/c date is: 1 day              Patient currently is not medically stable to d/c.            Subjective:   Her feet are still swollen.  No shortness of breath or chest pain.  Objective:    Vitals:   09/02/19 0430 09/02/19 1154 09/02/19 1449 09/02/19 1455  BP: (!) 167/68 (!) 132/55    Pulse: 67 63    Resp: 18     Temp: 98.1 F (36.7 C) 97.7 F (36.5 C)    TempSrc: Oral Oral    SpO2: 94% 90% (!) 78% 90%  Weight: 64.5 kg     Height:       No data found.   Intake/Output Summary (Last 24 hours) at 09/02/2019 1730 Last data filed at 09/02/2019 1017 Gross per 24 hour  Intake 120 ml  Output 0 ml  Net 120 ml   Filed Weights   08/31/19 0500 09/01/19 0601 09/02/19 0430  Weight: 65.9 kg 65 kg 64.5 kg    Exam:  GEN: NAD SKIN: No rash EYES: EOMI ENT: MMM CV: RRR PULM: mild bibasilar rales ABD: soft, ND, NT, +BS CNS: AAO x 3, non focal EXT: B/l leg edema 2+, no tenderness   Data Reviewed:   I have personally  reviewed following labs and imaging studies:  Labs: Labs show the following:   Basic Metabolic Panel: Recent Labs  Lab 08/27/19 1214 08/27/19 1214 08/30/19 1900 08/31/19 0654 09/02/19 0459  NA 142  --   --  143 141  K 3.7   < >  --  3.6 4.3  CL 107  --   --  108 105  CO2 24  --   --  28 29  GLUCOSE 137*  --   --  95 87  BUN 81*  --   --  51* 40*  CREATININE 4.14*  --  3.84* 2.97* 3.16*  CALCIUM 9.1  --   --  8.7* 9.1   < > = values in this interval not displayed.   GFR Estimated Creatinine Clearance: 11.8 mL/min (A) (by C-G formula based on SCr of 3.16 mg/dL (H)). Liver Function Tests: Recent Labs  Lab 08/27/19 1214 08/31/19 0654  AST 14* 14*  ALT 16 15  ALKPHOS 63 52  BILITOT 0.5 0.6  PROT 6.2* 5.6*  ALBUMIN 3.5 3.2*   No results for input(s): LIPASE, AMYLASE in the last 168  hours. No results for input(s): AMMONIA in the last 168 hours. Coagulation profile No results for input(s): INR, PROTIME in the last 168 hours.  CBC: Recent Labs  Lab 08/27/19 1214 08/30/19 1900 08/31/19 0654 09/02/19 0459  WBC 5.3 7.0 5.5 5.8  NEUTROABS 4.2  --   --   --   HGB 6.9* 8.3* 7.6* 7.7*  HCT 22.8* 25.8* 25.1* 25.1*  MCV 86.0 84.6 87.2 86.3  PLT 202 229 211 218   Cardiac Enzymes: No results for input(s): CKTOTAL, CKMB, CKMBINDEX, TROPONINI in the last 168 hours. BNP (last 3 results) No results for input(s): PROBNP in the last 8760 hours. CBG: Recent Labs  Lab 09/01/19 1652 09/01/19 2201 09/02/19 0730 09/02/19 1151 09/02/19 1633  GLUCAP 130* 111* 84 137* 149*   D-Dimer: No results for input(s): DDIMER in the last 72 hours. Hgb A1c: No results for input(s): HGBA1C in the last 72 hours. Lipid Profile: No results for input(s): CHOL, HDL, LDLCALC, TRIG, CHOLHDL, LDLDIRECT in the last 72 hours. Thyroid function studies: No results for input(s): TSH, T4TOTAL, T3FREE, THYROIDAB in the last 72 hours.  Invalid input(s): FREET3 Anemia work up: No results for input(s): VITAMINB12, FOLATE, FERRITIN, TIBC, IRON, RETICCTPCT in the last 72 hours. Sepsis Labs: Recent Labs  Lab 08/27/19 1214 08/30/19 1900 08/31/19 0654 09/02/19 0459  WBC 5.3 7.0 5.5 5.8    Microbiology Recent Results (from the past 240 hour(s))  MRSA PCR Screening     Status: None   Collection Time: 08/30/19  7:31 PM   Specimen: Nasopharyngeal Swab  Result Value Ref Range Status   MRSA by PCR NEGATIVE NEGATIVE Final    Comment:        The GeneXpert MRSA Assay (FDA approved for NASAL specimens only), is one component of a comprehensive MRSA colonization surveillance program. It is not intended to diagnose MRSA infection nor to guide or monitor treatment for MRSA infections. Performed at [facility], [address] [place], [place] [address]   Respiratory Panel by RT PCR  (Flu A&B, Covid) - Nasopharyngeal Swab     Status: None   Collection Time: 08/30/19  7:33 PM   Specimen: Nasopharyngeal Swab  Result Value Ref Range Status   SARS Coronavirus 2 by RT PCR NEGATIVE NEGATIVE Final    Comment: (NOTE) SARS-CoV-2 target nucleic acids are NOT DETECTED. The  SARS-CoV-2 RNA is generally detectable in upper respiratoy specimens during the acute phase of infection. The lowest concentration of SARS-CoV-2 viral copies this assay can detect is 131 copies/mL. A negative result does not preclude SARS-Cov-2 infection and should not be used as the sole basis for treatment or other patient management decisions. A negative result may occur with  improper specimen collection/handling, submission of specimen other than nasopharyngeal swab, presence of viral mutation(s) within the areas targeted by this assay, and inadequate number of viral copies (<131 copies/mL). A negative result must be combined with clinical observations, patient history, and epidemiological information. The expected result is Negative. Fact Sheet for Patients:  PinkCheek.be Fact Sheet for Healthcare Providers:  GravelBags.it This test is not yet ap proved or cleared by the [place] FDA and  has been authorized for detection and/or diagnosis of SARS-CoV-2 by FDA under an Emergency Use Authorization (EUA). This EUA will remain  in effect (meaning this test can be used) for the duration of the COVID-19 declaration under Section 564(b)(1) of the Act, 21 U.S.C. section 360bbb-3(b)(1), unless the authorization is terminated or revoked sooner.    Influenza A by PCR NEGATIVE NEGATIVE Final   Influenza B by PCR NEGATIVE NEGATIVE Final    Comment: (NOTE) The Xpert Xpress SARS-CoV-2/FLU/RSV assay is intended as an aid in  the diagnosis of influenza from Nasopharyngeal swab specimens and  should not be used as a sole basis for treatment. Nasal  washings and  aspirates are unacceptable for Xpert Xpress SARS-CoV-2/FLU/RSV  testing. Fact Sheet for Patients: PinkCheek.be Fact Sheet for Healthcare Providers: GravelBags.it This test is not yet approved or cleared by the [place] FDA and  has been authorized for detection and/or diagnosis of SARS-CoV-2 by  FDA under an Emergency Use Authorization (EUA). This EUA will remain  in effect (meaning this test can be used) for the duration of the  Covid-19 declaration under Section 564(b)(1) of the Act, 21  U.S.C. section 360bbb-3(b)(1), unless the authorization is  terminated or revoked. Performed at [facility], [address] [place], [place] [address]     Procedures and diagnostic studies:  No results found.  Medications:   . amLODipine  5 mg Oral Daily  . aspirin EC  81 mg Oral Daily  . brimonidine  1 drop Both Eyes BID  . carvedilol  25 mg Oral BID  . Chlorhexidine Gluconate Cloth  6 each Topical Q0600  . dorzolamide-timolol  1 drop Both Eyes BID  . furosemide  80 mg Intravenous Q12H  . heparin  5,000 Units Subcutaneous Q8H  . insulin aspart  0-6 Units Subcutaneous TID WC  . irbesartan  75 mg Oral Daily  . latanoprost  1 drop Left Eye QHS  . loratadine  10 mg Oral Daily  . olopatadine  1 drop Both Eyes BID  . sertraline  25 mg Oral Daily  . sodium chloride flush  3 mL Intravenous Q12H  . traZODone  150 mg Oral QHS   Continuous Infusions: . sodium chloride       LOS: 3 days   Lori Stanley  [facility]     09/02/2019, 5:30 PM

## 2019-09-02 NOTE — Progress Notes (Signed)
Familiar with this patient from pervious placement. Patient accepted at [facility], chair time will be Monday and Friday 10:30am. Patient stated that she had a friend that will be able to transport her to treatments. Patient aware of schedule. Please contact me with any other dialysis placement concerns.

## 2019-09-03 ENCOUNTER — Encounter: Admission: RE | Payer: Self-pay | Source: Home / Self Care

## 2019-09-03 ENCOUNTER — Ambulatory Visit: Admission: RE | Admit: 2019-09-03 | Payer: Medicare Other | Source: Home / Self Care | Admitting: Vascular Surgery

## 2019-09-03 DIAGNOSIS — N186 End stage renal disease: Secondary | ICD-10-CM | POA: Diagnosis not present

## 2019-09-03 DIAGNOSIS — J9621 Acute and chronic respiratory failure with hypoxia: Secondary | ICD-10-CM | POA: Diagnosis not present

## 2019-09-03 DIAGNOSIS — D638 Anemia in other chronic diseases classified elsewhere: Secondary | ICD-10-CM | POA: Diagnosis not present

## 2019-09-03 DIAGNOSIS — E8779 Other fluid overload: Secondary | ICD-10-CM | POA: Diagnosis not present

## 2019-09-03 LAB — BASIC METABOLIC PANEL
Anion gap: 6 (ref 5–15)
BUN: 45 mg/dL — ABNORMAL HIGH (ref 8–23)
CO2: 31 mmol/L (ref 22–32)
Calcium: 9.6 mg/dL (ref 8.9–10.3)
Chloride: 105 mmol/L (ref 98–111)
Creatinine, Ser: 3.33 mg/dL — ABNORMAL HIGH (ref 0.44–1.00)
GFR calc Af Amer: 14 mL/min — ABNORMAL LOW (ref >60)
GFR calc non Af Amer: 12 mL/min — ABNORMAL LOW (ref >60)
Glucose, Bld: 102 mg/dL — ABNORMAL HIGH (ref 70–99)
Potassium: 4.5 mmol/L (ref 3.5–5.1)
Sodium: 142 mmol/L (ref 135–145)

## 2019-09-03 LAB — GLUCOSE, CAPILLARY
Glucose-Capillary: 87 mg/dL (ref 70–99)
Glucose-Capillary: 152 mg/dL — ABNORMAL HIGH (ref 70–99)

## 2019-09-03 SURGERY — DIALYSIS/PERMA CATHETER REMOVAL
Anesthesia: LOCAL

## 2019-09-03 MED ORDER — EPOETIN ALFA 10000 UNIT/ML IJ SOLN
4000.0000 [IU] | Freq: Once | INTRAMUSCULAR | Status: AC
  Administered 2019-09-03: 4000 [IU] via INTRAVENOUS
  Filled 2019-09-03: qty 1

## 2019-09-03 NOTE — Discharge Summary (Signed)
Physician Discharge Summary  Lori Stanley:[id number] DOB: October 25, 1931 DOA: 08/30/2019  PCP: Jerrol Banana., MD  Admit date: 08/30/2019 Discharge date: 09/03/2019  Discharge disposition: Home    Recommendations for Outpatient Follow-Up:   Outpatient follow-up with PCP in 1 week Outpatient follow-up at hemodialysis center for dialysis on Mondays and Fridays   Discharge Diagnosis:   Active Problems:   Essential hypertension, malignant   Glaucoma   Diabetes ([place])   Anemia of chronic disease   Type 2 diabetes mellitus with hyperlipidemia ([place])   Respiratory failure ([place])   ESRD (end stage renal disease) ([place])   Volume overload    Discharge Condition: Stable.  Diet recommendation: Renal diet, diabetic diet  Code status: Full Code.    Hospital Course:   Lori Stanley is an 86 y.o. female with medical history significant forESRD (not on RRT prior to admission), HTN, chronic diastolic CHF, DM, glaucoma, chronic anemia (with recent blood transfusion for hemoglobin of 6.9 on 08/27/2019), chronic respiratory failure on home oxygen at night, who presented to the hospital with shortness of breath and significant bilateral leg swelling.   She was admitted to the hospital for acute on chronic diastolic CHF/volume overload from ESRD and acute hypoxemic respiratory failure.  She was seen in consultation by the nephrologist and she was started on hemodialysis.  She was also treated with IV Lasix.  Outpatient hemodialysis center has been arranged so patient could have dialysis on Mondays and Fridays as recommended by the nephrologist.  Her condition has improved and she is deemed stable for discharge to home.   She has chronic hypoxemic respiratory failure and uses home oxygen, 2 L/min per nasal canula at night.       Discharge Exam:   Vitals:   09/03/19 1628 09/03/19 1630  BP:  (!) 177/68  Pulse: (!) 58 (!) 58  Resp:  18  Temp:    SpO2: 93% 95%    Vitals:   09/03/19 1615 09/03/19 1625 09/03/19 1628 09/03/19 1630  BP: (!) 166/66   (!) 177/68  Pulse: (!) 58 63 (!) 58 (!) 58  Resp: 18   18  Temp:      TempSrc:      SpO2: 100% (!) 86% 93% 95%  Weight:      Height:         GEN: NAD SKIN: No rash EYES: No pallor or icterus ENT: MMM CV: RRR PULM: CTA B ABD: soft, ND, NT, +BS CNS: AAO x 3, non focal EXT: mild b/l leg edema or tenderness   The results of significant diagnostics from this hospitalization (including imaging, microbiology, ancillary and laboratory) are listed below for reference.     Procedures and Diagnostic Studies:   DG Chest 2 View  Result Date: 08/30/2019 CLINICAL DATA:  Shortness of breath for 1 week, TB screening EXAM: CHEST - 2 VIEW COMPARISON:  06/13/2019 FINDINGS: Cardiac shadow is enlarged but stable. Aortic calcifications are noted. Dialysis catheter is again seen and stable. Lungs are well aerated bilaterally. Vascular congestion is seen. Chronic scarring in the left base is noted. No bony abnormality is noted. IMPRESSION: Stable vascular congestion similar to that seen on the prior exam. No acute abnormality is noted. Electronically Signed   By: Inez Catalina M.D.   On: 08/30/2019 19:08     Labs:   Basic Metabolic Panel: Recent Labs  Lab 08/30/19 1900 08/31/19 0654 08/31/19 0654 09/02/19 0459 09/03/19 0539  NA  --  143  --  141 142  K  --  3.6   < > 4.3 4.5  CL  --  108  --  105 105  CO2  --  28  --  29 31  GLUCOSE  --  95  --  87 102*  BUN  --  51*  --  40* 45*  CREATININE 3.84* 2.97*  --  3.16* 3.33*  CALCIUM  --  8.7*  --  9.1 9.6   < > = values in this interval not displayed.   GFR Estimated Creatinine Clearance: 11.2 mL/min (A) (by C-G formula based on SCr of 3.33 mg/dL (H)). Liver Function Tests: Recent Labs  Lab 08/31/19 0654  AST 14*  ALT 15  ALKPHOS 52  BILITOT 0.6  PROT 5.6*  ALBUMIN 3.2*   No results for input(s): LIPASE, AMYLASE in the last 168 hours. No  results for input(s): AMMONIA in the last 168 hours. Coagulation profile No results for input(s): INR, PROTIME in the last 168 hours.  CBC: Recent Labs  Lab 08/30/19 1900 08/31/19 0654 09/02/19 0459  WBC 7.0 5.5 5.8  HGB 8.3* 7.6* 7.7*  HCT 25.8* 25.1* 25.1*  MCV 84.6 87.2 86.3  PLT 229 211 218   Cardiac Enzymes: No results for input(s): CKTOTAL, CKMB, CKMBINDEX, TROPONINI in the last 168 hours. BNP: Invalid input(s): POCBNP CBG: Recent Labs  Lab 09/02/19 1151 09/02/19 1633 09/02/19 2105 09/03/19 0741 09/03/19 1154  GLUCAP 137* 149* 151* 87 152*   D-Dimer No results for input(s): DDIMER in the last 72 hours. Hgb A1c No results for input(s): HGBA1C in the last 72 hours. Lipid Profile No results for input(s): CHOL, HDL, LDLCALC, TRIG, CHOLHDL, LDLDIRECT in the last 72 hours. Thyroid function studies No results for input(s): TSH, T4TOTAL, T3FREE, THYROIDAB in the last 72 hours.  Invalid input(s): FREET3 Anemia work up No results for input(s): VITAMINB12, FOLATE, FERRITIN, TIBC, IRON, RETICCTPCT in the last 72 hours. Microbiology Recent Results (from the past 240 hour(s))  MRSA PCR Screening     Status: None   Collection Time: 08/30/19  7:31 PM   Specimen: Nasopharyngeal Swab  Result Value Ref Range Status   MRSA by PCR NEGATIVE NEGATIVE Final    Comment:        The GeneXpert MRSA Assay (FDA approved for NASAL specimens only), is one component of a comprehensive MRSA colonization surveillance program. It is not intended to diagnose MRSA infection nor to guide or monitor treatment for MRSA infections. Performed at [facility], [address] [place], [place] [address]   Respiratory Panel by RT PCR (Flu A&B, Covid) - Nasopharyngeal Swab     Status: None   Collection Time: 08/30/19  7:33 PM   Specimen: Nasopharyngeal Swab  Result Value Ref Range Status   SARS Coronavirus 2 by RT PCR NEGATIVE NEGATIVE Final    Comment: (NOTE) SARS-CoV-2 target  nucleic acids are NOT DETECTED. The SARS-CoV-2 RNA is generally detectable in upper respiratoy specimens during the acute phase of infection. The lowest concentration of SARS-CoV-2 viral copies this assay can detect is 131 copies/mL. A negative result does not preclude SARS-Cov-2 infection and should not be used as the sole basis for treatment or other patient management decisions. A negative result may occur with  improper specimen collection/handling, submission of specimen other than nasopharyngeal swab, presence of viral mutation(s) within the areas targeted by this assay, and inadequate number of viral copies (<131 copies/mL). A negative result must be combined with clinical observations, patient history,  and epidemiological information. The expected result is Negative. Fact Sheet for Patients:  PinkCheek.be Fact Sheet for Healthcare Providers:  GravelBags.it This test is not yet ap proved or cleared by the [place] FDA and  has been authorized for detection and/or diagnosis of SARS-CoV-2 by FDA under an Emergency Use Authorization (EUA). This EUA will remain  in effect (meaning this test can be used) for the duration of the COVID-19 declaration under Section 564(b)(1) of the Act, 21 U.S.C. section 360bbb-3(b)(1), unless the authorization is terminated or revoked sooner.    Influenza A by PCR NEGATIVE NEGATIVE Final   Influenza B by PCR NEGATIVE NEGATIVE Final    Comment: (NOTE) The Xpert Xpress SARS-CoV-2/FLU/RSV assay is intended as an aid in  the diagnosis of influenza from Nasopharyngeal swab specimens and  should not be used as a sole basis for treatment. Nasal washings and  aspirates are unacceptable for Xpert Xpress SARS-CoV-2/FLU/RSV  testing. Fact Sheet for Patients: PinkCheek.be Fact Sheet for Healthcare Providers: GravelBags.it This test is not  yet approved or cleared by the [place] FDA and  has been authorized for detection and/or diagnosis of SARS-CoV-2 by  FDA under an Emergency Use Authorization (EUA). This EUA will remain  in effect (meaning this test can be used) for the duration of the  Covid-19 declaration under Section 564(b)(1) of the Act, 21  U.S.C. section 360bbb-3(b)(1), unless the authorization is  terminated or revoked. Performed at [facility], [address], [place], [place] [address]      Discharge Instructions:   Discharge Instructions    Diet Carb Modified   Complete by: As directed    Diet renal 60/70-06-03-1198   Complete by: As directed    Discharge instructions   Complete by: As directed    Follow-up at outpatient hemodialysis center [facility]) on Mondays and Fridays   Increase activity slowly   Complete by: As directed      Allergies as of 09/03/2019      Reactions   Antihistamines, Chlorpheniramine-type    Codeine    Paxil [paroxetine]    "makes her feel funny" not in a good way      Medication List    TAKE these medications   acetaminophen 500 MG tablet Commonly known as: TYLENOL Take 500-1,000 mg by mouth every 6 (six) hours as needed for mild pain or fever.   albuterol 108 (90 Base) MCG/ACT inhaler Commonly known as: VENTOLIN HFA Inhale 2 puffs into the lungs every 6 (six) hours as needed for wheezing or shortness of breath.   amLODipine 5 MG tablet Commonly known as: NORVASC Take 1 tablet (5 mg total) by mouth daily.   Aspirin Low Dose 81 MG EC tablet Generic drug: aspirin TAKE 1 TABLET (81 MG TOTAL) BY MOUTH DAILY. What changed: See the new instructions.   B-D UF III MINI PEN NEEDLES 31G X 5 MM Misc Generic drug: Insulin Pen Needle USE TWICE A DAY AS DIRECTED   Bepreve 1.5 % Soln Generic drug: Bepotastine Besilate Place 1 drop into both eyes daily.   brimonidine 0.2 % ophthalmic solution Commonly known as: ALPHAGAN Place 1 drop into both eyes  2 (two) times daily.   calcitRIOL 0.25 MCG capsule Commonly known as: ROCALTROL Take 0.25 mcg by mouth every Monday, Wednesday, and Friday.   carvedilol 12.5 MG tablet Commonly known as: COREG TAKE 2 TABLETS (25 MG TOTAL) BY MOUTH 2 (TWO) TIMES DAILY.   dorzolamide-timolol 22.3-6.8 MG/ML ophthalmic solution Commonly known  as: COSOPT Place 1 drop into both eyes 2 (two) times daily.   latanoprost 0.005 % ophthalmic solution Commonly known as: XALATAN Place 1 drop into the left eye at bedtime.   loratadine 10 MG tablet Commonly known as: CLARITIN Take 1 tablet (10 mg total) by mouth daily.   ONE TOUCH ULTRA MINI w/Device Kit 1 kit by Does not apply route daily.   OneTouch Delica Lancets 10U Misc USE AS DIRECTED   OneTouch Ultra test strip Generic drug: glucose blood USE AS DIRECTED   sertraline 25 MG tablet Commonly known as: ZOLOFT TAKE 1 TABLET BY MOUTH EVERY DAY   spironolactone 25 MG tablet Commonly known as: ALDACTONE Take 12.5 mg by mouth daily.   telmisartan 20 MG tablet Commonly known as: MICARDIS TAKE 1 TABLET BY MOUTH EVERY DAY What changed: how much to take   torsemide 100 MG tablet Commonly known as: DEMADEX Take 100 mg by mouth daily.   traZODone 150 MG tablet Commonly known as: DESYREL Take 150 mg by mouth at bedtime.   Vitamin D (Ergocalciferol) 1.25 MG (50000 UNIT) Caps capsule Commonly known as: DRISDOL Take 50,000 Units by mouth every 7 (seven) days.      Follow-up Information    [facility] up on 09/17/2019.   Specialty: Cardiology Why: at 10:00am. Enter through the [place] entrance Contact information: [place] [address] [place] [phone]           Time coordinating discharge: 28 minutes  Signed:  [facility] 09/03/2019, 4:35 PM

## 2019-09-03 NOTE — Progress Notes (Signed)
[facility], [place] 09/03/19  Subjective:   LOS: 4 05/03 0701 - 05/04 0700 In: 240 [P.O.:240] Out: -   Patient is admitted for initiation of hemodialysis Tolerating well so far Able to eat without nausea or vomiting.   Lower extremity edema appears to be improving Unfortunately patient did not get her dialysis treatment yesterday. She is expecting to be discharged later today  Objective:  Vital signs in last 24 hours:  Temp:  [97.7 F (36.5 C)-98.3 F (36.8 C)] 97.7 F (36.5 C) (05/04 1156) Pulse Rate:  [59-76] 60 (05/04 1156) Resp:  [18] 18 (05/04 0002) BP: (158-177)/(63-92) 158/63 (05/04 1156) SpO2:  [78 %-97 %] 95 % (05/04 1156)  Weight change:  Filed Weights   08/31/19 0500 09/01/19 0601 09/02/19 0430  Weight: 65.9 kg 65 kg 64.5 kg    Intake/Output:    Intake/Output Summary (Last 24 hours) at 09/03/2019 1346 Last data filed at 09/03/2019 0900 Gross per 24 hour  Intake 360 ml  Output --  Net 360 ml     Physical Exam: General:  No acute distress,   HEENT  anicteric, moist oral mucous membranes  Pulm/lungs  normal breathing effort, mild basilar crackles  CVS/Heart  2/6 murmur, regular  Abdomen:   Soft, nontender  Extremities:  1+ pitting edema bilaterally  Neurologic:  Alert, oriented, able to answer questions appropriately  Skin:  No acute rashes  Access:  Right IJ PermCath       Basic Metabolic Panel:  Recent Labs  Lab 08/30/19 1900 08/31/19 0654 09/02/19 0459 09/03/19 0539  NA  --  143 141 142  K  --  3.6 4.3 4.5  CL  --  108 105 105  CO2  --  28 29 31   GLUCOSE  --  95 87 102*  BUN  --  51* 40* 45*  CREATININE 3.84* 2.97* 3.16* 3.33*  CALCIUM  --  8.7* 9.1 9.6     CBC: Recent Labs  Lab 08/30/19 1900 08/31/19 0654 09/02/19 0459  WBC 7.0 5.5 5.8  HGB 8.3* 7.6* 7.7*  HCT 25.8* 25.1* 25.1*  MCV 84.6 87.2 86.3  PLT 229 211 218      Lab Results  Component Value Date   HEPBSAG NON REACTIVE 08/30/2019    HEPBSAB NON REACTIVE 08/30/2019   HEPBIGM NON REACTIVE 06/07/2019      Microbiology:  Recent Results (from the past 240 hour(s))  MRSA PCR Screening     Status: None   Collection Time: 08/30/19  7:31 PM   Specimen: Nasopharyngeal Swab  Result Value Ref Range Status   MRSA by PCR NEGATIVE NEGATIVE Final    Comment:        The GeneXpert MRSA Assay (FDA approved for NASAL specimens only), is one component of a comprehensive MRSA colonization surveillance program. It is not intended to diagnose MRSA infection nor to guide or monitor treatment for MRSA infections. Performed at [facility], [address] [place], [place] [address]   Respiratory Panel by RT PCR (Flu A&B, Covid) - Nasopharyngeal Swab     Status: None   Collection Time: 08/30/19  7:33 PM   Specimen: Nasopharyngeal Swab  Result Value Ref Range Status   SARS Coronavirus 2 by RT PCR NEGATIVE NEGATIVE Final    Comment: (NOTE) SARS-CoV-2 target nucleic acids are NOT DETECTED. The SARS-CoV-2 RNA is generally detectable in upper respiratoy specimens during the acute phase of infection. The lowest concentration of SARS-CoV-2 viral copies this assay  can detect is 131 copies/mL. A negative result does not preclude SARS-Cov-2 infection and should not be used as the sole basis for treatment or other patient management decisions. A negative result may occur with  improper specimen collection/handling, submission of specimen other than nasopharyngeal swab, presence of viral mutation(s) within the areas targeted by this assay, and inadequate number of viral copies (<131 copies/mL). A negative result must be combined with clinical observations, patient history, and epidemiological information. The expected result is Negative. Fact Sheet for Patients:  PinkCheek.be Fact Sheet for Healthcare Providers:  GravelBags.it This test is not yet ap proved or  cleared by the [place] FDA and  has been authorized for detection and/or diagnosis of SARS-CoV-2 by FDA under an Emergency Use Authorization (EUA). This EUA will remain  in effect (meaning this test can be used) for the duration of the COVID-19 declaration under Section 564(b)(1) of the Act, 21 U.S.C. section 360bbb-3(b)(1), unless the authorization is terminated or revoked sooner.    Influenza A by PCR NEGATIVE NEGATIVE Final   Influenza B by PCR NEGATIVE NEGATIVE Final    Comment: (NOTE) The Xpert Xpress SARS-CoV-2/FLU/RSV assay is intended as an aid in  the diagnosis of influenza from Nasopharyngeal swab specimens and  should not be used as a sole basis for treatment. Nasal washings and  aspirates are unacceptable for Xpert Xpress SARS-CoV-2/FLU/RSV  testing. Fact Sheet for Patients: PinkCheek.be Fact Sheet for Healthcare Providers: GravelBags.it This test is not yet approved or cleared by the [place] FDA and  has been authorized for detection and/or diagnosis of SARS-CoV-2 by  FDA under an Emergency Use Authorization (EUA). This EUA will remain  in effect (meaning this test can be used) for the duration of the  Covid-19 declaration under Section 564(b)(1) of the Act, 21  U.S.C. section 360bbb-3(b)(1), unless the authorization is  terminated or revoked. Performed at [facility], [address] [place], [place] [address]     Coagulation Studies: No results for input(s): LABPROT, INR in the last 72 hours.  Urinalysis: No results for input(s): COLORURINE, LABSPEC, PHURINE, GLUCOSEU, HGBUR, BILIRUBINUR, KETONESUR, PROTEINUR, UROBILINOGEN, NITRITE, LEUKOCYTESUR in the last 72 hours.  Invalid input(s): APPERANCEUR    Imaging: No results found.   Medications:   . sodium chloride     . amLODipine  5 mg Oral Daily  . aspirin EC  81 mg Oral Daily  . brimonidine  1 drop Both Eyes BID  .  carvedilol  25 mg Oral BID  . Chlorhexidine Gluconate Cloth  6 each Topical Q0600  . dorzolamide-timolol  1 drop Both Eyes BID  . epoetin (EPOGEN/PROCRIT) injection  4,000 Units Intravenous Once in dialysis  . furosemide  80 mg Intravenous Q12H  . heparin  5,000 Units Subcutaneous Q8H  . insulin aspart  0-6 Units Subcutaneous TID WC  . irbesartan  75 mg Oral Daily  . latanoprost  1 drop Left Eye QHS  . loratadine  10 mg Oral Daily  . olopatadine  1 drop Both Eyes BID  . sertraline  25 mg Oral Daily  . sodium chloride flush  3 mL Intravenous Q12H  . traZODone  150 mg Oral QHS   sodium chloride, sodium chloride flush  Assessment/ Plan:  86 y.o. female with  Chronic kidney disease Congestive heart failure Diabetes with complications of CKD, insulin-dependent Coronary disease/history of MI History of knee surgery  Active Problems:   Essential hypertension, malignant   Glaucoma   Diabetes ([place])  Anemia of chronic disease   Type 2 diabetes mellitus with hyperlipidemia (HCC)   Respiratory failure (HCC)   ESRD (end stage renal disease) (HCC)   Volume overload   #. ESRD with lower extremity edema  HD today Outpatient discharge planning for [facility] unit Twice weekly Monday-Friday schedule Outpatient orders placed.  Next expected hemodialysis on Friday Discussed possibility of PD with patient.  She states that at her age, and her comorbidities, she does not feel comfortable doing treatments at home  #. Anemia of CKD  Lab Results  Component Value Date   HGB 7.7 (L) 09/02/2019   Low dose EPO with HD  #. SHPTH     Component Value Date/Time   PTH 148 (H) 06/07/2019 9643   Lab Results  Component Value Date   PHOS 4.0 06/12/2019   Monitor calcium and phos level during this admission    #. Diabetes type 2 with CKD Hemoglobin A1C (%)  Date Value  07/23/2019 6.0 (A)  05/13/2013 8.5 (H)   Hgb A1c MFr Bld (%)  Date Value  04/10/2019 6.0 (H)   Insulin-dependent   LOS: 4 Dawnn Nam 5/4/20211:46 PM  [facility] [place], [place]

## 2019-09-03 NOTE — TOC Initial Note (Signed)
Transition of Care [facility]) - Initial/Assessment Note    Patient Details  Name: Lori Stanley MRN: [id number] Date of Birth: [birth year]  [facility] CM/SW Contact:    Beverly Sessions, RN Phone Number: 09/03/2019, 2:22 PM  Clinical Narrative:                 Patient admitted from home with volume overload Outpatient HD has been arranged  Patient has family that lives locally for support.   Patient was closed with home health services through [facility] on 07/12/19  Neighbor provides transportation to appointments, and will be taking patient to outpatient HD  PCP [name] Patient denies issues obtaining medications  Patient has RW in the home for ambulation.  Patient was set up with continuous home O2 through [facility] in February.   Per MD there is not an indication for patient to be evaluated by PT while inpatient.   No needs identified for discharge    Expected Discharge Plan: Home/Self Care Barriers to Discharge: Continued Medical Work up   Patient Goals and CMS Choice        Expected Discharge Plan and Services Expected Discharge Plan: Home/Self Care   Discharge Planning Services: CM Consult                                          Prior Living Arrangements/Services   Lives with:: Self Patient language and need for interpreter reviewed:: Yes Do you feel safe going back to the place where you live?: Yes      Need for Family Participation in Patient Care: Yes (Comment) Care giver support system in place?: Yes (comment) Current home services: DME Criminal Activity/Legal Involvement Pertinent to Current Situation/Hospitalization: No - Comment as needed  Activities of Daily Living Home Assistive Devices/Equipment: Walker (specify type)(four wheel) ADL Screening (condition at time of admission) Patient's cognitive ability adequate to safely complete daily activities?: Yes Is the patient deaf or have difficulty hearing?:  No Does the patient have difficulty seeing, even when wearing glasses/contacts?: No Does the patient have difficulty concentrating, remembering, or making decisions?: No Patient able to express need for assistance with ADLs?: Yes Does the patient have difficulty dressing or bathing?: No Independently performs ADLs?: Yes (appropriate for developmental age) Does the patient have difficulty walking or climbing stairs?: No Weakness of Legs: None Weakness of Arms/Hands: None  Permission Sought/Granted                  Emotional Assessment Appearance:: Appears stated age Attitude/Demeanor/Rapport: Gracious Affect (typically observed): Accepting Orientation: : Oriented to Self, Oriented to Place, Oriented to  Time, Oriented to Situation   Psych Involvement: No (comment)  Admission diagnosis:  Volume overload [E87.70] Patient Active Problem List   Diagnosis Date Noted  . Volume overload 08/30/2019  . Anemia due to stage 4 chronic kidney disease ([place]) 08/27/2019  . ESRD (end stage renal disease) ([place])   . Acute on chronic heart failure with preserved ejection fraction (HFpEF) ([place]) 06/05/2019  . Heart failure ([place]) 06/05/2019  . Edema of both lower extremities   . Anemia in chronic kidney disease 01/23/2019  . Benign hypertensive kidney disease with chronic kidney disease 01/23/2019  . Chronic kidney disease (CKD), stage V ([place]) 01/23/2019  . Proteinuria 01/23/2019  . Secondary hyperparathyroidism of renal origin ([place]) 01/23/2019  . Respiratory failure ([place]) 04/09/2017  .  Syncopal episodes 12/15/2015  . Anemia of chronic disease 04/14/2015  . Anxiety 04/14/2015  . Clinical depression 04/14/2015  . Essential hypertension 04/14/2015  . Gastro-esophageal reflux disease without esophagitis 04/14/2015  . Arthritis, degenerative 04/14/2015  . Allergic rhinitis 04/14/2015  . Type 2 diabetes mellitus with hyperlipidemia ([place]) 04/14/2015  . Hyperlipidemia 02/27/2015  . Glaucoma  12/23/2014  . Diabetes ([place]) 12/23/2014  . GERD (gastroesophageal reflux disease) 12/23/2014  . Essential hypertension, malignant 05/24/2013  . SOB (shortness of breath) 05/24/2013  . Acute on chronic diastolic CHF (congestive heart failure) ([place]) 05/24/2013   PCP:  Jerrol Banana., MD Pharmacy:   [id number] - [place], [place] - [address] [place] [address] Phone: [phone] Fax: [phone]     Social Determinants of Health (SDOH) Interventions    Readmission Risk Interventions Readmission Risk Prevention Plan 09/03/2019  Transportation Screening Complete  Palliative Care Screening Not Applicable  Medication Review (RN Care Manager) Complete  Some recent data might be hidden

## 2019-09-03 NOTE — Progress Notes (Signed)
Pt stable for HD Tx no c/os no distress noted cvc wdl ufg 1L vitals stable with O2

## 2019-09-03 NOTE — Progress Notes (Signed)
Pt stable tolerated HD tx well cvc wdl ufg achieved 1L

## 2019-09-04 ENCOUNTER — Other Ambulatory Visit (INDEPENDENT_AMBULATORY_CARE_PROVIDER_SITE_OTHER): Payer: Self-pay | Admitting: Vascular Surgery

## 2019-09-04 ENCOUNTER — Inpatient Hospital Stay: Payer: Medicare Other | Attending: Internal Medicine

## 2019-09-04 ENCOUNTER — Other Ambulatory Visit (INDEPENDENT_AMBULATORY_CARE_PROVIDER_SITE_OTHER): Payer: Medicare Other

## 2019-09-04 ENCOUNTER — Encounter (INDEPENDENT_AMBULATORY_CARE_PROVIDER_SITE_OTHER): Payer: Medicare Other

## 2019-09-04 ENCOUNTER — Inpatient Hospital Stay: Payer: Medicare Other

## 2019-09-04 ENCOUNTER — Ambulatory Visit (INDEPENDENT_AMBULATORY_CARE_PROVIDER_SITE_OTHER): Payer: Medicare Other | Admitting: Nurse Practitioner

## 2019-09-04 DIAGNOSIS — N185 Chronic kidney disease, stage 5: Secondary | ICD-10-CM

## 2019-09-04 DIAGNOSIS — R5383 Other fatigue: Secondary | ICD-10-CM | POA: Insufficient documentation

## 2019-09-04 DIAGNOSIS — R0602 Shortness of breath: Secondary | ICD-10-CM | POA: Insufficient documentation

## 2019-09-04 DIAGNOSIS — N186 End stage renal disease: Secondary | ICD-10-CM | POA: Insufficient documentation

## 2019-09-04 DIAGNOSIS — E785 Hyperlipidemia, unspecified: Secondary | ICD-10-CM | POA: Insufficient documentation

## 2019-09-04 DIAGNOSIS — I509 Heart failure, unspecified: Secondary | ICD-10-CM | POA: Insufficient documentation

## 2019-09-04 DIAGNOSIS — E1122 Type 2 diabetes mellitus with diabetic chronic kidney disease: Secondary | ICD-10-CM | POA: Insufficient documentation

## 2019-09-04 DIAGNOSIS — Z79899 Other long term (current) drug therapy: Secondary | ICD-10-CM | POA: Insufficient documentation

## 2019-09-04 DIAGNOSIS — Z87891 Personal history of nicotine dependence: Secondary | ICD-10-CM | POA: Insufficient documentation

## 2019-09-04 DIAGNOSIS — Z809 Family history of malignant neoplasm, unspecified: Secondary | ICD-10-CM | POA: Insufficient documentation

## 2019-09-04 DIAGNOSIS — M549 Dorsalgia, unspecified: Secondary | ICD-10-CM | POA: Insufficient documentation

## 2019-09-04 DIAGNOSIS — I132 Hypertensive heart and chronic kidney disease with heart failure and with stage 5 chronic kidney disease, or end stage renal disease: Secondary | ICD-10-CM | POA: Insufficient documentation

## 2019-09-04 DIAGNOSIS — Z885 Allergy status to narcotic agent status: Secondary | ICD-10-CM | POA: Insufficient documentation

## 2019-09-04 DIAGNOSIS — D631 Anemia in chronic kidney disease: Secondary | ICD-10-CM | POA: Insufficient documentation

## 2019-09-04 DIAGNOSIS — Z992 Dependence on renal dialysis: Secondary | ICD-10-CM | POA: Insufficient documentation

## 2019-09-04 DIAGNOSIS — M2550 Pain in unspecified joint: Secondary | ICD-10-CM | POA: Insufficient documentation

## 2019-09-04 DIAGNOSIS — I252 Old myocardial infarction: Secondary | ICD-10-CM | POA: Insufficient documentation

## 2019-09-04 DIAGNOSIS — Z8249 Family history of ischemic heart disease and other diseases of the circulatory system: Secondary | ICD-10-CM | POA: Insufficient documentation

## 2019-09-05 ENCOUNTER — Inpatient Hospital Stay: Payer: Medicare Other

## 2019-09-05 ENCOUNTER — Other Ambulatory Visit: Payer: Self-pay

## 2019-09-05 VITALS — BP 141/55 | HR 62 | Resp 18

## 2019-09-05 DIAGNOSIS — Z8249 Family history of ischemic heart disease and other diseases of the circulatory system: Secondary | ICD-10-CM | POA: Diagnosis not present

## 2019-09-05 DIAGNOSIS — R0602 Shortness of breath: Secondary | ICD-10-CM | POA: Diagnosis not present

## 2019-09-05 DIAGNOSIS — I132 Hypertensive heart and chronic kidney disease with heart failure and with stage 5 chronic kidney disease, or end stage renal disease: Secondary | ICD-10-CM | POA: Diagnosis not present

## 2019-09-05 DIAGNOSIS — I509 Heart failure, unspecified: Secondary | ICD-10-CM | POA: Diagnosis not present

## 2019-09-05 DIAGNOSIS — Z809 Family history of malignant neoplasm, unspecified: Secondary | ICD-10-CM | POA: Diagnosis not present

## 2019-09-05 DIAGNOSIS — M549 Dorsalgia, unspecified: Secondary | ICD-10-CM | POA: Diagnosis not present

## 2019-09-05 DIAGNOSIS — R5383 Other fatigue: Secondary | ICD-10-CM | POA: Diagnosis not present

## 2019-09-05 DIAGNOSIS — I252 Old myocardial infarction: Secondary | ICD-10-CM | POA: Diagnosis not present

## 2019-09-05 DIAGNOSIS — E785 Hyperlipidemia, unspecified: Secondary | ICD-10-CM | POA: Diagnosis not present

## 2019-09-05 DIAGNOSIS — D631 Anemia in chronic kidney disease: Secondary | ICD-10-CM

## 2019-09-05 DIAGNOSIS — E1122 Type 2 diabetes mellitus with diabetic chronic kidney disease: Secondary | ICD-10-CM | POA: Diagnosis not present

## 2019-09-05 DIAGNOSIS — Z79899 Other long term (current) drug therapy: Secondary | ICD-10-CM | POA: Diagnosis not present

## 2019-09-05 DIAGNOSIS — M2550 Pain in unspecified joint: Secondary | ICD-10-CM | POA: Diagnosis not present

## 2019-09-05 DIAGNOSIS — Z87891 Personal history of nicotine dependence: Secondary | ICD-10-CM | POA: Diagnosis not present

## 2019-09-05 DIAGNOSIS — Z885 Allergy status to narcotic agent status: Secondary | ICD-10-CM | POA: Diagnosis not present

## 2019-09-05 DIAGNOSIS — Z992 Dependence on renal dialysis: Secondary | ICD-10-CM | POA: Diagnosis not present

## 2019-09-05 DIAGNOSIS — N186 End stage renal disease: Secondary | ICD-10-CM | POA: Diagnosis not present

## 2019-09-05 LAB — BASIC METABOLIC PANEL
Anion gap: 12 (ref 5–15)
BUN: 36 mg/dL — ABNORMAL HIGH (ref 8–23)
CO2: 30 mmol/L (ref 22–32)
Calcium: 9.6 mg/dL (ref 8.9–10.3)
Chloride: 100 mmol/L (ref 98–111)
Creatinine, Ser: 3 mg/dL — ABNORMAL HIGH (ref 0.44–1.00)
GFR calc Af Amer: 16 mL/min — ABNORMAL LOW (ref >60)
GFR calc non Af Amer: 14 mL/min — ABNORMAL LOW (ref >60)
Glucose, Bld: 156 mg/dL — ABNORMAL HIGH (ref 70–99)
Potassium: 3.8 mmol/L (ref 3.5–5.1)
Sodium: 142 mmol/L (ref 135–145)

## 2019-09-05 LAB — SAMPLE TO BLOOD BANK

## 2019-09-05 LAB — HEMOGLOBIN: Hemoglobin: 8.5 g/dL — ABNORMAL LOW (ref 12.0–15.0)

## 2019-09-05 LAB — HEMATOCRIT: HCT: 28.1 % — ABNORMAL LOW (ref 36.0–46.0)

## 2019-09-05 MED ORDER — IRON SUCROSE 20 MG/ML IV SOLN
200.0000 mg | Freq: Once | INTRAVENOUS | Status: AC
  Administered 2019-09-05: 200 mg via INTRAVENOUS
  Filled 2019-09-05: qty 10

## 2019-09-05 MED ORDER — SODIUM CHLORIDE 0.9 % IV SOLN
Freq: Once | INTRAVENOUS | Status: AC
  Filled 2019-09-05: qty 250

## 2019-09-06 DIAGNOSIS — Z992 Dependence on renal dialysis: Secondary | ICD-10-CM | POA: Diagnosis not present

## 2019-09-06 DIAGNOSIS — N186 End stage renal disease: Secondary | ICD-10-CM | POA: Diagnosis not present

## 2019-09-09 ENCOUNTER — Telehealth: Payer: Self-pay | Admitting: Family

## 2019-09-09 DIAGNOSIS — Z992 Dependence on renal dialysis: Secondary | ICD-10-CM | POA: Diagnosis not present

## 2019-09-09 DIAGNOSIS — N2581 Secondary hyperparathyroidism of renal origin: Secondary | ICD-10-CM | POA: Diagnosis not present

## 2019-09-09 DIAGNOSIS — N186 End stage renal disease: Secondary | ICD-10-CM | POA: Diagnosis not present

## 2019-09-09 DIAGNOSIS — E119 Type 2 diabetes mellitus without complications: Secondary | ICD-10-CM | POA: Diagnosis not present

## 2019-09-09 NOTE — Telephone Encounter (Signed)
LVM for patient regarding a follow up appointment for CHF Clinic after her recent hospital discharge. Asked patient to call back to confirm asap.   Lori Stanley, [place]

## 2019-09-10 ENCOUNTER — Other Ambulatory Visit: Payer: Self-pay | Admitting: *Deleted

## 2019-09-10 DIAGNOSIS — D631 Anemia in chronic kidney disease: Secondary | ICD-10-CM

## 2019-09-10 DIAGNOSIS — N184 Chronic kidney disease, stage 4 (severe): Secondary | ICD-10-CM

## 2019-09-11 ENCOUNTER — Inpatient Hospital Stay: Payer: Medicare Other

## 2019-09-11 ENCOUNTER — Other Ambulatory Visit: Payer: Self-pay

## 2019-09-11 ENCOUNTER — Inpatient Hospital Stay (HOSPITAL_BASED_OUTPATIENT_CLINIC_OR_DEPARTMENT_OTHER): Payer: Medicare Other | Admitting: Internal Medicine

## 2019-09-11 DIAGNOSIS — Z885 Allergy status to narcotic agent status: Secondary | ICD-10-CM | POA: Diagnosis not present

## 2019-09-11 DIAGNOSIS — Z8249 Family history of ischemic heart disease and other diseases of the circulatory system: Secondary | ICD-10-CM | POA: Diagnosis not present

## 2019-09-11 DIAGNOSIS — Z992 Dependence on renal dialysis: Secondary | ICD-10-CM | POA: Diagnosis not present

## 2019-09-11 DIAGNOSIS — D631 Anemia in chronic kidney disease: Secondary | ICD-10-CM

## 2019-09-11 DIAGNOSIS — M549 Dorsalgia, unspecified: Secondary | ICD-10-CM | POA: Diagnosis not present

## 2019-09-11 DIAGNOSIS — Z79899 Other long term (current) drug therapy: Secondary | ICD-10-CM | POA: Diagnosis not present

## 2019-09-11 DIAGNOSIS — R5383 Other fatigue: Secondary | ICD-10-CM | POA: Diagnosis not present

## 2019-09-11 DIAGNOSIS — R0602 Shortness of breath: Secondary | ICD-10-CM | POA: Diagnosis not present

## 2019-09-11 DIAGNOSIS — I252 Old myocardial infarction: Secondary | ICD-10-CM | POA: Diagnosis not present

## 2019-09-11 DIAGNOSIS — N186 End stage renal disease: Secondary | ICD-10-CM | POA: Diagnosis not present

## 2019-09-11 DIAGNOSIS — I509 Heart failure, unspecified: Secondary | ICD-10-CM | POA: Diagnosis not present

## 2019-09-11 DIAGNOSIS — N184 Chronic kidney disease, stage 4 (severe): Secondary | ICD-10-CM | POA: Diagnosis not present

## 2019-09-11 DIAGNOSIS — Z87891 Personal history of nicotine dependence: Secondary | ICD-10-CM | POA: Diagnosis not present

## 2019-09-11 DIAGNOSIS — E785 Hyperlipidemia, unspecified: Secondary | ICD-10-CM | POA: Diagnosis not present

## 2019-09-11 DIAGNOSIS — E1122 Type 2 diabetes mellitus with diabetic chronic kidney disease: Secondary | ICD-10-CM | POA: Diagnosis not present

## 2019-09-11 DIAGNOSIS — M2550 Pain in unspecified joint: Secondary | ICD-10-CM | POA: Diagnosis not present

## 2019-09-11 DIAGNOSIS — Z809 Family history of malignant neoplasm, unspecified: Secondary | ICD-10-CM | POA: Diagnosis not present

## 2019-09-11 DIAGNOSIS — I132 Hypertensive heart and chronic kidney disease with heart failure and with stage 5 chronic kidney disease, or end stage renal disease: Secondary | ICD-10-CM | POA: Diagnosis not present

## 2019-09-11 LAB — CBC WITH DIFFERENTIAL/PLATELET
Abs Immature Granulocytes: 0.02 10*3/uL (ref 0.00–0.07)
Basophils Absolute: 0 10*3/uL (ref 0.0–0.1)
Basophils Relative: 0 %
Eosinophils Absolute: 0.2 10*3/uL (ref 0.0–0.5)
Eosinophils Relative: 3 %
HCT: 28.2 % — ABNORMAL LOW (ref 36.0–46.0)
Hemoglobin: 8.7 g/dL — ABNORMAL LOW (ref 12.0–15.0)
Immature Granulocytes: 0 %
Lymphocytes Relative: 16 %
Lymphs Abs: 0.8 10*3/uL (ref 0.7–4.0)
MCH: 26.8 pg (ref 26.0–34.0)
MCHC: 30.9 g/dL (ref 30.0–36.0)
MCV: 86.8 fL (ref 80.0–100.0)
Monocytes Absolute: 0.5 10*3/uL (ref 0.1–1.0)
Monocytes Relative: 9 %
Neutro Abs: 3.6 10*3/uL (ref 1.7–7.7)
Neutrophils Relative %: 72 %
Platelets: 256 10*3/uL (ref 150–400)
RBC: 3.25 MIL/uL — ABNORMAL LOW (ref 3.87–5.11)
RDW: 16.4 % — ABNORMAL HIGH (ref 11.5–15.5)
WBC: 5.1 10*3/uL (ref 4.0–10.5)
nRBC: 0 % (ref 0.0–0.2)

## 2019-09-11 LAB — SAMPLE TO BLOOD BANK

## 2019-09-11 MED ORDER — IRON SUCROSE 20 MG/ML IV SOLN
200.0000 mg | Freq: Once | INTRAVENOUS | Status: AC
  Administered 2019-09-11: 200 mg via INTRAVENOUS
  Filled 2019-09-11: qty 10

## 2019-09-11 MED ORDER — SODIUM CHLORIDE 0.9 % IV SOLN
Freq: Once | INTRAVENOUS | Status: AC
  Filled 2019-09-11: qty 250

## 2019-09-11 NOTE — Progress Notes (Signed)
[facility] CONSULT NOTE  Patient Care Team: Jerrol Banana., MD as PCP - General (Family Medicine) Wellington Hampshire, MD as PCP - Cardiology (Cardiology) Sharlotte Alamo, DPM (Podiatry) Hayden Pedro, MD as Consulting Physician (Ophthalmology) Lavonia Dana, MD as Consulting Physician (Internal Medicine) Odette Fraction (Optometry) Cathi Roan, [facility] (Pharmacist) Alisa Graff, FNP (Family Medicine) Cammie Sickle, MD as Consulting Physician (Internal Medicine)  CHIEF COMPLAINTS/PURPOSE OF CONSULTATION:    HEMATOLOGY HISTORY  # ANEMIA EGD-; colonoscopy?  # CKD stage IV [Dr.Kolluru] s/p HD [appx off since 1 month]; May 2021-restart back on dialysis.  HISTORY OF PRESENTING ILLNESS:  Lori Stanley 86 y.o.  female chronic kidney disease; anemia is here for follow-up.  Since the last visit patient was admitted to hospital for for fluid overload; and resumed on dialysis.  Patient also received blood transfusion in the hospital.  Patient is currently back on dialysis twice weekly.  Review of Systems  Constitutional: Positive for malaise/fatigue. Negative for chills, diaphoresis, fever and weight loss.  HENT: Negative for nosebleeds and sore throat.   Eyes: Negative for double vision.  Respiratory: Positive for shortness of breath. Negative for cough, hemoptysis, sputum production and wheezing.   Cardiovascular: Negative for chest pain, palpitations, orthopnea and leg swelling.  Gastrointestinal: Negative for abdominal pain, blood in stool, constipation, diarrhea, heartburn, melena, nausea and vomiting.  Genitourinary: Negative for dysuria, frequency and urgency.  Musculoskeletal: Positive for back pain and joint pain.  Skin: Negative.  Negative for itching and rash.  Neurological: Negative for dizziness, tingling, focal weakness, weakness and headaches.  Endo/Heme/Allergies: Does not bruise/bleed easily.  Psychiatric/Behavioral: Negative  for depression. The patient is not nervous/anxious and does not have insomnia.     MEDICAL HISTORY:  Past Medical History:  Diagnosis Date  . Acute heart failure ([place])   . CHF (congestive heart failure) ([place])   . Chronic kidney disease   . Chronic low back pain   . Diabetes mellitus without complication ([place])   . Hyperlipidemia   . Hypertension   . MI (myocardial infarction) ([place])   . Obesity     SURGICAL HISTORY: Past Surgical History:  Procedure Laterality Date  . DIALYSIS/PERMA CATHETER INSERTION N/A 06/07/2019   Procedure: DIALYSIS/PERMA CATHETER INSERTION;  Surgeon: Katha Cabal, MD;  Location: [facility];  Service: Cardiovascular;  Laterality: N/A;  . EYE SURGERY    . KNEE SURGERY    . THYROID SURGERY    . VAGINAL HYSTERECTOMY      SOCIAL HISTORY: Social History   Socioeconomic History  . Marital status: Widowed    Spouse name: Not on file  . Number of children: 1  . Years of education: Not on file  . Highest education level: 11th grade  Occupational History  . Occupation: retired  Tobacco Use  . Smoking status: Former Smoker    Types: Cigarettes  . Smokeless tobacco: Never Used  . Tobacco comment: Quit in 2015-2016  Substance and Sexual Activity  . Alcohol use: No  . Drug use: No  . Sexual activity: Never  Other Topics Concern  . Not on file  Social History Narrative   Lives in [place]; self; friends does chores; quit smoking many years; no alcohol. Worked in the [employer].    Social Determinants of Health   Financial Resource Strain: Low Risk   . Difficulty of Paying Living Expenses: Not hard at all  Food Insecurity: No Food Insecurity  . Worried About  Running Out of Food in the Last Year: Never true  . Ran Out of Food in the Last Year: Never true  Transportation Needs: No Transportation Needs  . Lack of Transportation (Medical): No  . Lack of Transportation (Non-Medical): No  Physical Activity: Inactive  . Days of Exercise per Week:  0 days  . Minutes of Exercise per Session: 0 min  Stress: No Stress Concern Present  . Feeling of Stress : Only a little  Social Connections: Somewhat Isolated  . Frequency of Communication with Friends and Family: More than three times a week  . Frequency of Social Gatherings with Friends and Family: Once a week  . Attends Religious Services: More than 4 times per year  . Active Member of Clubs or Organizations: No  . Attends Archivist Meetings: Never  . Marital Status: Widowed  Intimate Partner Violence: Not At Risk  . Fear of Current or Ex-Partner: No  . Emotionally Abused: No  . Physically Abused: No  . Sexually Abused: No    FAMILY HISTORY: Family History  Problem Relation Age of Onset  . Heart attack Mother   . Heart disease Sister   . Cancer Sister     ALLERGIES:  is allergic to antihistamines, chlorpheniramine-type; codeine; and paxil [paroxetine].  MEDICATIONS:  Current Outpatient Medications  Medication Sig Dispense Refill  . acetaminophen (TYLENOL) 500 MG tablet Take 500-1,000 mg by mouth every 6 (six) hours as needed for mild pain or fever.     [name] albuterol (VENTOLIN HFA) 108 (90 Base) MCG/ACT inhaler Inhale 2 puffs into the lungs every 6 (six) hours as needed for wheezing or shortness of breath. 18 g 3  . amLODipine (NORVASC) 5 MG tablet Take 1 tablet (5 mg total) by mouth daily. 90 tablet 3  . ASPIRIN LOW DOSE 81 MG EC tablet TAKE 1 TABLET (81 MG TOTAL) BY MOUTH DAILY. (Patient taking differently: Take 81 mg by mouth daily. ) 90 tablet 1  . Bepotastine Besilate (BEPREVE) 1.5 % SOLN Place 1 drop into both eyes daily.    . Blood Glucose Monitoring Suppl (ONE TOUCH ULTRA MINI) w/Device KIT 1 kit by Does not apply route daily. 1 kit 0  . brimonidine (ALPHAGAN) 0.2 % ophthalmic solution Place 1 drop into both eyes 2 (two) times daily.     . calcitRIOL (ROCALTROL) 0.25 MCG capsule Take 0.25 mcg by mouth every Monday, Wednesday, and Friday.     . carvedilol  (COREG) 12.5 MG tablet TAKE 2 TABLETS (25 MG TOTAL) BY MOUTH 2 (TWO) TIMES DAILY. 360 tablet 3  . dorzolamide-timolol (COSOPT) 22.3-6.8 MG/ML ophthalmic solution Place 1 drop into both eyes 2 (two) times daily.     . Insulin Pen Needle (B-D UF III MINI PEN NEEDLES) 31G X 5 MM MISC USE TWICE A DAY AS DIRECTED 100 each 5  . latanoprost (XALATAN) 0.005 % ophthalmic solution Place 1 drop into the left eye at bedtime.     [name] loratadine (CLARITIN) 10 MG tablet Take 1 tablet (10 mg total) by mouth daily. 90 tablet 3  . OneTouch Delica Lancets 83J MISC USE AS DIRECTED 100 each 1  . ONETOUCH ULTRA test strip USE AS DIRECTED 100 strip 5  . sertraline (ZOLOFT) 25 MG tablet TAKE 1 TABLET BY MOUTH EVERY DAY (Patient taking differently: Take 25 mg by mouth daily. ) 90 tablet 2  . spironolactone (ALDACTONE) 25 MG tablet Take 12.5 mg by mouth daily.    [name] telmisartan (MICARDIS) 20 MG  tablet TAKE 1 TABLET BY MOUTH EVERY DAY (Patient taking differently: Take 40 mg by mouth daily. ) 90 tablet 3  . torsemide (DEMADEX) 100 MG tablet Take 100 mg by mouth daily.    . traZODone (DESYREL) 150 MG tablet Take 150 mg by mouth at bedtime.    . Vitamin D, Ergocalciferol, (DRISDOL) 50000 units CAPS capsule Take 50,000 Units by mouth every 7 (seven) days.     No current facility-administered medications for this visit.      PHYSICAL EXAMINATION:   Vitals:   09/11/19 1340  BP: (!) 159/57  Pulse: (!) 59  Resp: 20  Temp: (!) 95.7 F (35.4 C)   Filed Weights   09/11/19 1336  Weight: 140 lb (63.5 kg)    Physical Exam  Constitutional: She is oriented to person, place, and time.  Elderly frail-appearing female patient.  She is resting in the wheelchair.  She is alone.  HENT:  Head: Normocephalic and atraumatic.  Mouth/Throat: Oropharynx is clear and moist. No oropharyngeal exudate.  Eyes: Pupils are equal, round, and reactive to light.  Cardiovascular: Normal rate and regular rhythm.  Pulmonary/Chest: No  respiratory distress. She has no wheezes.  Decreased air entry in the bases.  Abdominal: Soft. Bowel sounds are normal. She exhibits no distension and no mass. There is no abdominal tenderness. There is no rebound and no guarding.  Musculoskeletal:        General: No tenderness or edema. Normal range of motion.     Cervical back: Normal range of motion and neck supple.  Neurological: She is alert and oriented to person, place, and time.  Skin: Skin is warm.  Psychiatric: Affect normal.    LABORATORY DATA:  I have reviewed the data as listed Lab Results  Component Value Date   WBC 5.1 09/11/2019   HGB 8.7 (L) 09/11/2019   HCT 28.2 (L) 09/11/2019   MCV 86.8 09/11/2019   PLT 256 09/11/2019   Recent Labs    06/05/19 0102 06/06/19 7829 06/06/19 2023 06/06/19 2023 06/07/19 0612 06/07/19 0629 08/27/19 1214 08/30/19 1900 08/31/19 0654 08/31/19 0654 09/02/19 0459 09/03/19 0539 09/05/19 0828  NA 136   < > 137   < > 139   < > 142  --  143   < > 141 142 142  K 4.7   < > 4.7   < > 4.2   < > 3.7  --  3.6   < > 4.3 4.5 3.8  CL 102   < > 103   < > 103   < > 107  --  108   < > 105 105 100  CO2 25   < > 25   < > 25   < > 24  --  28   < > 29 31 30   GLUCOSE 150*   < > 147*   < > 111*   < > 137*  --  95   < > 87 102* 156*  BUN 61*   < > 65*   < > 63*   < > 81*  --  51*   < > 40* 45* 36*  CREATININE 3.86*   < > 3.74*   < > 3.81*   < > 4.14*   < > 2.97*   < > 3.16* 3.33* 3.00*  CALCIUM 9.2   < > 9.6   < > 9.5   < > 9.1  --  8.7*   < > 9.1 9.6 9.6  GFRNONAA 10*   < > 10*   < > 10*   < > 9*   < > 14*   < > 13* 12* 14*  GFRAA 12*   < > 12*   < > 12*   < > 11*   < > 16*   < > 15* 14* 16*  PROT 6.7  --  6.3*  --   --   --  6.2*  --  5.6*  --   --   --   --   ALBUMIN 3.6  --  3.5   < > 3.3*  --  3.5  --  3.2*  --   --   --   --   AST 26  --  26  --   --   --  14*  --  14*  --   --   --   --   ALT 19  --  23  --   --   --  16  --  15  --   --   --   --   ALKPHOS 69  --  68  --   --   --  63  --   52  --   --   --   --   BILITOT 0.6  --  0.6  --   --   --  0.5  --  0.6  --   --   --   --   BILIDIR 0.1  --   --   --   --   --   --   --   --   --   --   --   --   IBILI 0.5  --   --   --   --   --   --   --   --   --   --   --   --    < > = values in this interval not displayed.     DG Chest 2 View  Result Date: 08/30/2019 CLINICAL DATA:  Shortness of breath for 1 week, TB screening EXAM: CHEST - 2 VIEW COMPARISON:  06/13/2019 FINDINGS: Cardiac shadow is enlarged but stable. Aortic calcifications are noted. Dialysis catheter is again seen and stable. Lungs are well aerated bilaterally. Vascular congestion is seen. Chronic scarring in the left base is noted. No bony abnormality is noted. IMPRESSION: Stable vascular congestion similar to that seen on the prior exam. No acute abnormality is noted. Electronically Signed   By: Inez Catalina M.D.   On: 08/30/2019 19:08    Anemia due to stage 4 chronic kidney disease ([place]) # Anemia/end-stage renal disease/iron deficiency-hemoglobin today 8.2.  Proceed with Venofer today.  #End-stage renal disease-currently on back on HD dialysis [M/F].  Recommend follow-up with nephrology for ongoing IV iron infusion/erythropoietin support.  # DISPOSITION: # venofer today # cancel future venofer appts # Follow up as needed-Dr.B   All questions were answered. The patient knows to call the clinic with any problems, questions or concerns.   Cammie Sickle, MD 09/15/2019 7:05 PM

## 2019-09-11 NOTE — Assessment & Plan Note (Addendum)
#   Anemia/end-stage renal disease/iron deficiency-hemoglobin today 8.2.  Proceed with Venofer today.  #End-stage renal disease-currently on back on HD dialysis [M/F].  Recommend follow-up with nephrology for ongoing IV iron infusion/erythropoietin support.  # DISPOSITION: # venofer today # cancel future venofer appts # Follow up as needed-Dr.B

## 2019-09-13 DIAGNOSIS — R062 Wheezing: Secondary | ICD-10-CM | POA: Diagnosis not present

## 2019-09-13 DIAGNOSIS — Z992 Dependence on renal dialysis: Secondary | ICD-10-CM | POA: Diagnosis not present

## 2019-09-13 DIAGNOSIS — N186 End stage renal disease: Secondary | ICD-10-CM | POA: Diagnosis not present

## 2019-09-13 DIAGNOSIS — I509 Heart failure, unspecified: Secondary | ICD-10-CM | POA: Diagnosis not present

## 2019-09-13 DIAGNOSIS — J449 Chronic obstructive pulmonary disease, unspecified: Secondary | ICD-10-CM | POA: Diagnosis not present

## 2019-09-15 DIAGNOSIS — I5022 Chronic systolic (congestive) heart failure: Secondary | ICD-10-CM | POA: Diagnosis not present

## 2019-09-16 DIAGNOSIS — N2581 Secondary hyperparathyroidism of renal origin: Secondary | ICD-10-CM | POA: Diagnosis not present

## 2019-09-16 DIAGNOSIS — N186 End stage renal disease: Secondary | ICD-10-CM | POA: Diagnosis not present

## 2019-09-16 DIAGNOSIS — Z992 Dependence on renal dialysis: Secondary | ICD-10-CM | POA: Diagnosis not present

## 2019-09-16 NOTE — Progress Notes (Signed)
Patient ID: Lori Stanley, female    DOB: Dec 05, 1931, 86 y.o.   MRN: [id number]  HPI  Lori Stanley is a 86 y/o female with a history of DM, hyperlipidemia, HTN, CKD, previous tobacco use and chronic heart failure.   Echo report from 06/05/19 reviewed and showed an EF of 60-65% along with mildly elevated PA pressure, moderate LVH and a 7cm left pleural effusion.   Admitted 08/30/19 due to acute on chronic HF. Nephrology consult obtained. Initially needed IV lasix and hemodialysis. Discharged after 4 days. Admitted 06/05/19 due to acute on chronic HF. Cardiology and vascular consults obtained. Initially was intubated and had IV lasix and lasix gtt. Transitioned to oral diuretics. Needed dialysis. Given empiric antibiotics. Discharged after 12 days with oxygen.  She presents today for a follow-up visit with a chief complaint of minimal fatigue upon moderate exertion. She describes this as chronic in nature having been present for several years. She has associated pedal edema long with this but says that the edema is "much better". She denies any difficulty sleeping, dizziness, abdominal distention, palpitations, chest pain, shortness of breath, cough or weight gain.   Says that she is using Mrs. Dash for seasoning. Gets dialysis twice/ week.   Past Medical History:  Diagnosis Date  . Acute heart failure ([address])   . CHF (congestive heart failure) ([place])   . Chronic kidney disease   . Chronic low back pain   . Diabetes mellitus without complication ([place])   . Hyperlipidemia   . Hypertension   . MI (myocardial infarction) ([place])   . Obesity    Past Surgical History:  Procedure Laterality Date  . DIALYSIS/PERMA CATHETER INSERTION N/A 06/07/2019   Procedure: DIALYSIS/PERMA CATHETER INSERTION;  Surgeon: Katha Cabal, MD;  Location: [facility];  Service: Cardiovascular;  Laterality: N/A;  . EYE SURGERY    . KNEE SURGERY    . THYROID SURGERY    . VAGINAL HYSTERECTOMY     Family  History  Problem Relation Age of Onset  . Heart attack Mother   . Heart disease Sister   . Cancer Sister    Social History   Tobacco Use  . Smoking status: Former Smoker    Types: Cigarettes  . Smokeless tobacco: Never Used  . Tobacco comment: Quit in 2015-2016  Substance Use Topics  . Alcohol use: No   Allergies  Allergen Reactions  . Antihistamines, Chlorpheniramine-Type   . Codeine   . Paxil [Paroxetine]     "makes her feel funny" not in a good way   Prior to Admission medications   Medication Sig Start Date End Date Taking? Authorizing Provider  acetaminophen (TYLENOL) 500 MG tablet Take 500-1,000 mg by mouth every 6 (six) hours as needed for mild pain or fever.    Yes [provider]  albuterol (VENTOLIN HFA) 108 (90 Base) MCG/ACT inhaler Inhale 2 puffs into the lungs every 6 (six) hours as needed for wheezing or shortness of breath. 06/25/19  Yes Jerrol Banana., MD  amLODipine (NORVASC) 5 MG tablet Take 1 tablet (5 mg total) by mouth daily. 11/05/18  Yes Jerrol Banana., MD  ASPIRIN LOW DOSE 81 MG EC tablet TAKE 1 TABLET (81 MG TOTAL) BY MOUTH DAILY. Patient taking differently: Take 81 mg by mouth daily.  06/02/19  Yes Jerrol Banana., MD  Bepotastine Besilate (BEPREVE) 1.5 % SOLN Place 1 drop into both eyes daily.   Yes [provider]  brimonidine (  ALPHAGAN) 0.2 % ophthalmic solution Place 1 drop into both eyes 2 (two) times daily.    Yes [provider]  calcitRIOL (ROCALTROL) 0.25 MCG capsule Take 0.25 mcg by mouth every Monday, Wednesday, and Friday.  10/23/18  Yes [provider]  carvedilol (COREG) 12.5 MG tablet TAKE 2 TABLETS (25 MG TOTAL) BY MOUTH 2 (TWO) TIMES DAILY. 07/29/19  Yes Jerrol Banana., MD  dorzolamide-timolol (COSOPT) 22.3-6.8 MG/ML ophthalmic solution Place 1 drop into both eyes 2 (two) times daily.  03/06/19  Yes [provider]  latanoprost (XALATAN) 0.005 % ophthalmic solution Place  1 drop into the left eye at bedtime.  02/11/15  Yes [provider]  loratadine (CLARITIN) 10 MG tablet Take 1 tablet (10 mg total) by mouth daily. 12/15/14  Yes Jerrol Banana., MD  sertraline (ZOLOFT) 25 MG tablet TAKE 1 TABLET BY MOUTH EVERY DAY Patient taking differently: Take 25 mg by mouth daily.  04/02/19  Yes Jerrol Banana., MD  spironolactone (ALDACTONE) 25 MG tablet Take 12.5 mg by mouth daily.   Yes [provider]  telmisartan (MICARDIS) 20 MG tablet TAKE 1 TABLET BY MOUTH EVERY DAY Patient taking differently: Take 40 mg by mouth daily.  06/02/19  Yes Jerrol Banana., MD  torsemide (DEMADEX) 100 MG tablet Take 100 mg by mouth daily.   Yes [provider]  traZODone (DESYREL) 150 MG tablet Take 150 mg by mouth at bedtime.   Yes [provider]  Vitamin D, Ergocalciferol, (DRISDOL) 50000 units CAPS capsule Take 50,000 Units by mouth every 7 (seven) days.   Yes [provider]      Review of Systems  Constitutional: Positive for fatigue (very little). Negative for appetite change.  HENT: Positive for rhinorrhea. Negative for congestion and sore throat.   Eyes: Negative.   Respiratory: Negative for cough and shortness of breath.   Cardiovascular: Positive for leg swelling. Negative for chest pain and palpitations.  Gastrointestinal: Negative for abdominal distention and abdominal pain.  Endocrine: Negative.   Genitourinary: Negative.   Musculoskeletal: Negative for back pain and neck pain.  Skin: Negative.   Allergic/Immunologic: Negative.   Neurological: Negative for dizziness, light-headedness and headaches.  Hematological: Negative for adenopathy. Does not bruise/bleed easily.  Psychiatric/Behavioral: Negative for dysphoric mood and sleep disturbance (sleeping on 1 pillow with oxygen @ 2L). The patient is not nervous/anxious.    Vitals:   09/17/19 0951  BP: (!) 144/57  Pulse: 65  Resp: 15  SpO2: 91%  Weight:  130 lb 6 oz (59.1 kg)  Height: 5\' 3"  (1.6 m)   Wt Readings from Last 3 Encounters:  09/17/19 130 lb 6 oz (59.1 kg)  09/11/19 140 lb (63.5 kg)  09/02/19 142 lb 3.2 oz (64.5 kg)   Lab Results  Component Value Date   CREATININE 3.00 (H) 09/05/2019   CREATININE 3.33 (H) 09/03/2019   CREATININE 3.16 (H) 09/02/2019    Physical Exam Vitals and nursing note reviewed.  Constitutional:      Appearance: Normal appearance.  HENT:     Head: Normocephalic and atraumatic.  Cardiovascular:     Rate and Rhythm: Normal rate and regular rhythm.  Pulmonary:     Effort: Pulmonary effort is normal. No respiratory distress.     Breath sounds: No wheezing or rales.  Abdominal:     General: Abdomen is flat. There is no distension.     Palpations: Abdomen is soft.  Musculoskeletal:  General: No tenderness.     Cervical back: Normal range of motion and neck supple.     Right lower leg: Edema (trace pitting) present.     Left lower leg: Edema (trace pitting) present.  Skin:    General: Skin is warm and dry.  Neurological:     General: No focal deficit present.     Mental Status: She is alert and oriented to person, place, and time.  Psychiatric:        Mood and Affect: Mood normal.        Behavior: Behavior normal.    Assessment & Plan:  1: Chronic heart failure with preserved ejection fraction with structural changes- - NYHA class II - euvolemic today - not weighing daily; encouraged her to resume so that she can call for an overnight weight gain of >2 pounds or a weekly weight gain of >5 pounds - weight down 7 pounds from last visit here 2 months ago - not adding salt to her food and tries to eat low sodium foods; reviewed the importance of closely following a 2000mg  sodium diet; using Mrs Deliah Boston for seasoning - [facility] cardiology saw patient during February admission - BNP 06/06/19 was 549.0 - PharmD reconciled medications with the patient - has received both her COVID vaccines  2:  HTN- - BP mildly elevated today - saw PCP [name]) 07/23/19 & returns later this month - BMP from 09/05/19 reviewed and showed sodium 142, potassium 3.8, creatinine 3.00 and GFR 16  3: DM-  - A1c 07/23/19 was 6.0% - not checking her glucose at home  4: CKD stage V- - dialysis on M & F - saw nephrology Maia Breslow) 08/30/19   5: Lymphedema- - stage 2 - limited in her ability to exercise due to being unsteady - has worn compression socks in the past but says that they "cut into her leg" - encouraged her to elevate her legs when sitting for long periods of time - saw vascular (Schnier) 08/26/19   Patient did not bring her medications nor a list. Each medication was verbally reviewed with the patient and she was encouraged to bring the bottles to every visit to confirm accuracy of list.   Return in 6 months or sooner for any questions/problems before then.

## 2019-09-17 ENCOUNTER — Other Ambulatory Visit: Payer: Self-pay

## 2019-09-17 ENCOUNTER — Encounter: Payer: Self-pay | Admitting: Family

## 2019-09-17 ENCOUNTER — Ambulatory Visit: Payer: Medicare Other | Attending: Family | Admitting: Family

## 2019-09-17 VITALS — BP 144/57 | HR 65 | Resp 15 | Ht 63.0 in | Wt 130.4 lb

## 2019-09-17 DIAGNOSIS — I5032 Chronic diastolic (congestive) heart failure: Secondary | ICD-10-CM | POA: Insufficient documentation

## 2019-09-17 DIAGNOSIS — Z87891 Personal history of nicotine dependence: Secondary | ICD-10-CM | POA: Diagnosis not present

## 2019-09-17 DIAGNOSIS — Z8249 Family history of ischemic heart disease and other diseases of the circulatory system: Secondary | ICD-10-CM | POA: Insufficient documentation

## 2019-09-17 DIAGNOSIS — Z992 Dependence on renal dialysis: Secondary | ICD-10-CM | POA: Insufficient documentation

## 2019-09-17 DIAGNOSIS — I132 Hypertensive heart and chronic kidney disease with heart failure and with stage 5 chronic kidney disease, or end stage renal disease: Secondary | ICD-10-CM | POA: Insufficient documentation

## 2019-09-17 DIAGNOSIS — E1122 Type 2 diabetes mellitus with diabetic chronic kidney disease: Secondary | ICD-10-CM | POA: Diagnosis not present

## 2019-09-17 DIAGNOSIS — J90 Pleural effusion, not elsewhere classified: Secondary | ICD-10-CM | POA: Diagnosis not present

## 2019-09-17 DIAGNOSIS — N185 Chronic kidney disease, stage 5: Secondary | ICD-10-CM | POA: Insufficient documentation

## 2019-09-17 DIAGNOSIS — Z7982 Long term (current) use of aspirin: Secondary | ICD-10-CM | POA: Insufficient documentation

## 2019-09-17 DIAGNOSIS — I10 Essential (primary) hypertension: Secondary | ICD-10-CM

## 2019-09-17 DIAGNOSIS — E669 Obesity, unspecified: Secondary | ICD-10-CM | POA: Insufficient documentation

## 2019-09-17 DIAGNOSIS — E785 Hyperlipidemia, unspecified: Secondary | ICD-10-CM | POA: Insufficient documentation

## 2019-09-17 DIAGNOSIS — Z794 Long term (current) use of insulin: Secondary | ICD-10-CM

## 2019-09-17 DIAGNOSIS — R5383 Other fatigue: Secondary | ICD-10-CM | POA: Insufficient documentation

## 2019-09-17 DIAGNOSIS — Z7901 Long term (current) use of anticoagulants: Secondary | ICD-10-CM | POA: Diagnosis not present

## 2019-09-17 DIAGNOSIS — I252 Old myocardial infarction: Secondary | ICD-10-CM | POA: Diagnosis not present

## 2019-09-17 DIAGNOSIS — I890 Lymphedema, not elsewhere classified: Secondary | ICD-10-CM | POA: Diagnosis not present

## 2019-09-17 DIAGNOSIS — Z79899 Other long term (current) drug therapy: Secondary | ICD-10-CM | POA: Diagnosis not present

## 2019-09-17 NOTE — Progress Notes (Signed)
[facility] - PHARMACIST COUNSELING NOTE  ADHERENCE ASSESSMENT  Adherence strategy: Niece helps patient with medications. She takes them from a pillbox.    Do you ever forget to take your medication? []Yes (1) [x]No (0)  Do you ever skip doses due to side effects? []Yes (1) [x]No (0)  Do you have trouble affording your medicines? []Yes (1) [x]No (0)  Are you ever unable to pick up your medication due to transportation difficulties? []Yes (1) [x]No (0)  Do you ever stop taking your medications because you don't believe they are helping? []Yes (1) [x]No (0)  Total score 0   Recommendations given to patient about increasing adherence: None needed  Guideline-Directed Medical Therapy/Evidence Based Medicine  ACE/ARB/ARNI: Telmisartan 40 mg daily Beta Blocker: Carvedilol 25 mg BID Aldosterone Antagonist: Spironolactone 12.5 mg daily Diuretic: Torsemide 100 mg daily    SUBJECTIVE  HPI: Patient is an 86 y/o F with PMH as below who presents to CHF clinic for follow-up. She was recently hospitalized 4/30 - 5/4 for volume overload secondary to CKD and was started on HD.  Past Medical History:  Diagnosis Date  . Acute heart failure ([place])   . CHF (congestive heart failure) ([place])   . Chronic kidney disease   . Chronic low back pain   . Diabetes mellitus without complication ([place])   . Hyperlipidemia   . Hypertension   . MI (myocardial infarction) ([place])   . Obesity       OBJECTIVE   Vital signs: HR 65, BP 144/57, weight (pounds) 130 ECHO: Date 06/05/19, EF 60-65%, notes: moderate LVH, grade II diastolic dysfunction. LA severely dilated, mildly elevated PA systolic pressure. Mild-moderate AV sclerosis.   BMP Latest Ref Rng & Units 09/05/2019 09/03/2019 09/02/2019  Glucose 70 - 99 mg/dL 156(H) 102(H) 87  BUN 8 - 23 mg/dL 36(H) 45(H) 40(H)  Creatinine 0.44 - 1.00 mg/dL 3.00(H) 3.33(H) 3.16(H)  BUN/Creat Ratio 12 - 28 - - -  Sodium 135 - 145  mmol/L 142 142 141  Potassium 3.5 - 5.1 mmol/L 3.8 4.5 4.3  Chloride 98 - 111 mmol/L 100 105 105  CO2 22 - 32 mmol/L _0 Calcium 8.9 - 10.3 mg/dL 9.6 9.6 9.1    ASSESSMENT  Patient is well appearing in no acute distress. She is accompanied by her niece who helps her with medications at home. Endorses taking medications daily as prescribed. Denies adverse effects of therapy / missed doses.   PLAN  1). Diastolic HF -Torsemide 093 mg daily + spironolactone 12.5 mg daily. She still makes urine -K 3.8 on 09/05/19 -Discussed with provider - no changes to medication regimen today  2). Hypertension -Mild isolated systolic hypertension today -Antihypertensives include carvedilol 25 mg BID, amlodipine 5 mg daily, telmisartan 40 mg daily, torsemide 100 mg daily, spironolactone 12.5 mg daily  3). ESRD on HD -HD on M/F -SHPTH: calcitriol 0.25 mcg MWF, ergocalciferol 50k units weekly. Calcium 9.6 on 09/05/19  4). Anemia -Secondary to ESRD + IDA -Managed by nephrology   5). T2DM, controlled -HgbA1c was 6% on 07/23/19 -Lifestyle management only  6). Hx of MI -Aspirin 81 mg daily   Time spent: 15 minutes  [place] Resident 09/17/2019 9:51 AM    Current Outpatient Medications:  .  acetaminophen (TYLENOL) 500 MG tablet, Take 500-1,000 mg by mouth every 6 (six) hours as needed for mild pain or fever. , Disp: , Rfl:  .  albuterol (VENTOLIN HFA)  108 (90 Base) MCG/ACT inhaler, Inhale 2 puffs into the lungs every 6 (six) hours as needed for wheezing or shortness of breath., Disp: 18 g, Rfl: 3 .  amLODipine (NORVASC) 5 MG tablet, Take 1 tablet (5 mg total) by mouth daily., Disp: 90 tablet, Rfl: 3 .  ASPIRIN LOW DOSE 81 MG EC tablet, TAKE 1 TABLET (81 MG TOTAL) BY MOUTH DAILY. (Patient taking differently: Take 81 mg by mouth daily. ), Disp: 90 tablet, Rfl: 1 .  Bepotastine Besilate (BEPREVE) 1.5 % SOLN, Place 1 drop into both eyes daily., Disp: , Rfl:  .  Blood Glucose  Monitoring Suppl (ONE TOUCH ULTRA MINI) w/Device KIT, 1 kit by Does not apply route daily., Disp: 1 kit, Rfl: 0 .  brimonidine (ALPHAGAN) 0.2 % ophthalmic solution, Place 1 drop into both eyes 2 (two) times daily. , Disp: , Rfl:  .  calcitRIOL (ROCALTROL) 0.25 MCG capsule, Take 0.25 mcg by mouth every Monday, Wednesday, and Friday. , Disp: , Rfl:  .  carvedilol (COREG) 12.5 MG tablet, TAKE 2 TABLETS (25 MG TOTAL) BY MOUTH 2 (TWO) TIMES DAILY., Disp: 360 tablet, Rfl: 3 .  dorzolamide-timolol (COSOPT) 22.3-6.8 MG/ML ophthalmic solution, Place 1 drop into both eyes 2 (two) times daily. , Disp: , Rfl:  .  Insulin Pen Needle (B-D UF III MINI PEN NEEDLES) 31G X 5 MM MISC, USE TWICE A DAY AS DIRECTED, Disp: 100 each, Rfl: 5 .  latanoprost (XALATAN) 0.005 % ophthalmic solution, Place 1 drop into the left eye at bedtime. , Disp: , Rfl:  .  loratadine (CLARITIN) 10 MG tablet, Take 1 tablet (10 mg total) by mouth daily., Disp: 90 tablet, Rfl: 3 .  OneTouch Delica Lancets 87O MISC, USE AS DIRECTED, Disp: 100 each, Rfl: 1 .  ONETOUCH ULTRA test strip, USE AS DIRECTED, Disp: 100 strip, Rfl: 5 .  sertraline (ZOLOFT) 25 MG tablet, TAKE 1 TABLET BY MOUTH EVERY DAY (Patient taking differently: Take 25 mg by mouth daily. ), Disp: 90 tablet, Rfl: 2 .  spironolactone (ALDACTONE) 25 MG tablet, Take 12.5 mg by mouth daily., Disp: , Rfl:  .  telmisartan (MICARDIS) 20 MG tablet, TAKE 1 TABLET BY MOUTH EVERY DAY (Patient taking differently: Take 40 mg by mouth daily. ), Disp: 90 tablet, Rfl: 3 .  torsemide (DEMADEX) 100 MG tablet, Take 100 mg by mouth daily., Disp: , Rfl:  .  traZODone (DESYREL) 150 MG tablet, Take 150 mg by mouth at bedtime., Disp: , Rfl:  .  Vitamin D, Ergocalciferol, (DRISDOL) 50000 units CAPS capsule, Take 50,000 Units by mouth every 7 (seven) days., Disp: , Rfl:    COUNSELING POINTS/CLINICAL PEARLS  Carvedilol (Goal: weight less than 85 kg is 25 mg BID, weight greater than 85 kg is 50 mg  BID)  Patient should avoid activities requiring coordination until drug effects are realized, as drug may cause dizziness.  This drug may cause diarrhea, nausea, vomiting, arthralgia, back pain, myalgia, headache, vision disorder, erectile dysfunction, reduced libido, or fatigue.  Instruct patient to report signs/symptoms of adverse cardiovascular effects such as hypotension (especially in elderly patients), arrhythmias, syncope, palpitations, angina, or edema.  Drug may mask symptoms of hypoglycemia. Advise diabetic patients to carefully monitor blood sugar levels.  Patient should take drug with food.  Advise patient against sudden discontinuation of drug. Torsemide  Side effects may include excessive urination.  Tell patient to report symptoms of ototoxicity.  Instruct patient to report lightheadedness or syncope.  Warn patient to avoid use  of nonprescription NSAID products without first discussing it with their healthcare provider. Spironolactone  Warn patient to report dehydration, hypotension, or symptoms of worsening renal function.  Counsel female patient to report gynecomastia.  Side effects may include diarrhea, nausea, vomiting, abdominal cramping, fever, leg cramps, lethargy, mental confusion, decreased libido, irregular menses, and rash. Suspension: Tell patient to take drug consistently with respect to food, either before or after a meal.  Advise patient to avoid potassium supplements and foods containing high levels of potassium, including salt substitutes.  DRUGS TO AVOID IN HEART FAILURE  Drug or Class Mechanism  Analgesics . NSAIDs . COX-2 inhibitors . Glucocorticoids  Sodium and water retention, increased systemic vascular resistance, decreased response to diuretics   Diabetes Medications . Metformin . Thiazolidinediones o Rosiglitazone (Avandia) o Pioglitazone (Actos) . DPP4 Inhibitors o Saxagliptin (Onglyza) o Sitagliptin (Januvia)   Lactic acidosis Possible  calcium channel blockade   Unknown  Antiarrhythmics . Class I  o Flecainide o Disopyramide . Class III o Sotalol . Other o Dronedarone  Negative inotrope, proarrhythmic   Proarrhythmic, beta blockade  Negative inotrope  Antihypertensives . Alpha Blockers o Doxazosin . Calcium Channel Blockers o Diltiazem o Verapamil o Nifedipine . Central Alpha Adrenergics o Moxonidine . Peripheral Vasodilators o Minoxidil  Increases renin and aldosterone  Negative inotrope    Possible sympathetic withdrawal  Unknown  Anti-infective . Itraconazole . Amphotericin B  Negative inotrope Unknown  Hematologic . Anagrelide . Cilostazol   Possible inhibition of PD IV Inhibition of PD III causing arrhythmias  Neurologic/Psychiatric . Stimulants . Anti-Seizure Drugs o Carbamazepine o Pregabalin . Antidepressants o Tricyclics o Citalopram . Parkinsons o Bromocriptine o Pergolide o Pramipexole . Antipsychotics o Clozapine . Antimigraine o Ergotamine o Methysergide . Appetite suppressants . Bipolar o Lithium  Peripheral alpha and beta agonist activity  Negative inotrope and chronotrope Calcium channel blockade  Negative inotrope, proarrhythmic Dose-dependent QT prolongation  Excessive serotonin activity/valvular damage Excessive serotonin activity/valvular damage Unknown  IgE mediated hypersensitivy, calcium channel blockade  Excessive serotonin activity/valvular damage Excessive serotonin activity/valvular damage Valvular damage  Direct myofibrillar degeneration, adrenergic stimulation  Antimalarials . Chloroquine . Hydroxychloroquine Intracellular inhibition of lysosomal enzymes  Urologic Agents . Alpha Blockers o Doxazosin o Prazosin o Tamsulosin o Terazosin  Increased renin and aldosterone  Adapted from Page RL, et al. "Drugs That May Cause or Exacerbate Heart Failure: A Scientific Statement from the [place]." Circulation  2016; 409:W11-B14. DOI: 10.1161/CIR.0000000000000426   MEDICATION ADHERENCES TIPS AND STRATEGIES 1. Taking medication as prescribed improves patient outcomes in heart failure (reduces hospitalizations, improves symptoms, increases survival) 2. Side effects of medications can be managed by decreasing doses, switching agents, stopping drugs, or adding additional therapy. Please let someone in the [facility] know if you have having bothersome side effects so we can modify your regimen. Do not alter your medication regimen without talking to [place].  3. Medication reminders can help patients remember to take drugs on time. If you are missing or forgetting doses you can try linking behaviors, using pill boxes, or an electronic reminder like an alarm on your phone or an app. Some people can also get automated phone calls as medication reminders.

## 2019-09-17 NOTE — Patient Instructions (Signed)
Continue weighing daily and call for an overnight weight gain of > 2 pounds or a weekly weight gain of >5 pounds. 

## 2019-09-18 ENCOUNTER — Inpatient Hospital Stay: Payer: Medicare Other

## 2019-09-20 ENCOUNTER — Telehealth: Payer: Self-pay | Admitting: Cardiovascular Disease

## 2019-09-20 DIAGNOSIS — N186 End stage renal disease: Secondary | ICD-10-CM | POA: Diagnosis not present

## 2019-09-20 DIAGNOSIS — Z992 Dependence on renal dialysis: Secondary | ICD-10-CM | POA: Diagnosis not present

## 2019-09-20 NOTE — Telephone Encounter (Signed)
[place] Maia Breslow PA-C: Good Afternoon, This patient is a dialysis patient that I follow outpatient. She is going to need a permanent dialysis access placed soon. Can we get her scheduled for cardiac clearance? It appears that she has been seen by you all previously. Thanks!  Dr. Garen LahEfraim Kaufmann, please schedule patient with Dr. Fletcher Anon or PA/NP for a follow up re: cardiac risk stratification  Will route to scheduling for them to get appointment with [place] or APP to be seen.

## 2019-09-23 ENCOUNTER — Telehealth: Payer: Self-pay | Admitting: Family Medicine

## 2019-09-23 DIAGNOSIS — N186 End stage renal disease: Secondary | ICD-10-CM | POA: Diagnosis not present

## 2019-09-23 DIAGNOSIS — Z992 Dependence on renal dialysis: Secondary | ICD-10-CM | POA: Diagnosis not present

## 2019-09-23 NOTE — Chronic Care Management (AMB) (Signed)
  Chronic Care Management   Outreach Note  09/23/2019 Name: Lori Stanley MRN: [id number] DOB: [birth year]  Lori Stanley is a 86 y.o. year old female who is a primary care patient of Jerrol Banana., MD. I reached out to [name] by phone today in response to a referral sent by [name]'s health plan.     An unsuccessful telephone outreach was attempted today. The patient was referred to the case management team for assistance with care management and care coordination.   Follow Up Plan: A HIPPA compliant phone message was left for the patient providing contact information and requesting a return call. The care management team will reach out to the patient again over the next 7 days. If patient returns call to provider office, please advise to call [place] at [phone].  [place], [place] [address] Direct Dial: [phone] [email or web address]: [email or web address]

## 2019-09-25 ENCOUNTER — Ambulatory Visit: Payer: Medicare Other

## 2019-09-25 NOTE — Telephone Encounter (Signed)
Attempted to schedule.  LMOV to call office.  ° °

## 2019-09-26 NOTE — Chronic Care Management (AMB) (Signed)
  Care Management   Note  09/26/2019 Name: Lori Stanley MRN: [id number] DOB: 06/20/1931  Lori Stanley is a 86 y.o. year old female who is a primary care patient of Jerrol Banana., MD and is actively engaged with the care management team. I reached out to [name] by phone today to assist with scheduling an initial visit with the RN Case Manager.  [name] was given information about Chronic Care Management services today including:  1. CCM service includes personalized support from designated clinical staff supervised by her physician, including individualized plan of care and coordination with other care providers 2. 24/7 contact phone numbers for assistance for urgent and routine care needs. 3. Service will only be billed when office clinical staff spend 20 minutes or more in a month to coordinate care. 4. Only one practitioner may furnish and bill the service in a calendar month. 5. The patient may stop CCM services at any time (effective at the end of the month) by phone call to the office staff. 6. The patient will be responsible for cost sharing (co-pay) of up to 20% of the service fee (after annual deductible is met).  Patient agreed to services and verbal consent obtained.   Follow up plan: Telephone appointment with care management team member scheduled for:10/31/2019.  [place], [place] [address] Direct Dial: [phone] [email or web address]: [email or web address]

## 2019-09-27 DIAGNOSIS — Z992 Dependence on renal dialysis: Secondary | ICD-10-CM | POA: Diagnosis not present

## 2019-09-27 DIAGNOSIS — N186 End stage renal disease: Secondary | ICD-10-CM | POA: Diagnosis not present

## 2019-09-30 DIAGNOSIS — N186 End stage renal disease: Secondary | ICD-10-CM | POA: Diagnosis not present

## 2019-09-30 DIAGNOSIS — Z992 Dependence on renal dialysis: Secondary | ICD-10-CM | POA: Diagnosis not present

## 2019-10-04 DIAGNOSIS — N186 End stage renal disease: Secondary | ICD-10-CM | POA: Diagnosis not present

## 2019-10-04 DIAGNOSIS — N2581 Secondary hyperparathyroidism of renal origin: Secondary | ICD-10-CM | POA: Diagnosis not present

## 2019-10-04 DIAGNOSIS — Z992 Dependence on renal dialysis: Secondary | ICD-10-CM | POA: Diagnosis not present

## 2019-10-05 ENCOUNTER — Other Ambulatory Visit: Payer: Self-pay | Admitting: Family Medicine

## 2019-10-05 DIAGNOSIS — J309 Allergic rhinitis, unspecified: Secondary | ICD-10-CM

## 2019-10-05 NOTE — Telephone Encounter (Signed)
Approved per protocol. Requested Prescriptions  Pending Prescriptions Disp Refills  . albuterol (VENTOLIN HFA) 108 (90 Base) MCG/ACT inhaler [Pharmacy Med Name: ALBUTEROL HFA (PROAIR) INHALER] 8 g 3    Sig: TAKE 2 PUFFS BY MOUTH EVERY 6 HOURS AS NEEDED FOR WHEEZE OR SHORTNESS OF BREATH     Pulmonology:  Beta Agonists Failed - 10/05/2019 12:52 AM      Failed - One inhaler should last at least one month. If the patient is requesting refills earlier, contact the patient to check for uncontrolled symptoms.      Passed - Valid encounter within last 12 months    Recent Outpatient Visits          2 months ago Type 2 diabetes mellitus with hyperlipidemia [facility])   [facility] Jerrol Banana., MD   3 months ago Acute on chronic diastolic CHF (congestive heart failure) [facility])   [facility] Jerrol Banana., MD   4 months ago Chronic diastolic heart failure [facility])   [facility] Jerrol Banana., MD   4 months ago Primary insomnia   [facility] F[place] Malling M, [place]   5 months ago Type 2 diabetes mellitus with stage 5 chronic kidney disease not on chronic dialysis, with long-term current use of insulin [facility])   [facility] Jerrol Banana., MD      Future Appointments            In 3 weeks Jerrol Banana., MD [facility]

## 2019-10-07 DIAGNOSIS — Z992 Dependence on renal dialysis: Secondary | ICD-10-CM | POA: Diagnosis not present

## 2019-10-07 DIAGNOSIS — N186 End stage renal disease: Secondary | ICD-10-CM | POA: Diagnosis not present

## 2019-10-09 ENCOUNTER — Ambulatory Visit (INDEPENDENT_AMBULATORY_CARE_PROVIDER_SITE_OTHER): Payer: Medicare Other

## 2019-10-09 ENCOUNTER — Other Ambulatory Visit (INDEPENDENT_AMBULATORY_CARE_PROVIDER_SITE_OTHER): Payer: Self-pay | Admitting: Nurse Practitioner

## 2019-10-09 ENCOUNTER — Ambulatory Visit (INDEPENDENT_AMBULATORY_CARE_PROVIDER_SITE_OTHER): Payer: Medicare Other | Admitting: Nurse Practitioner

## 2019-10-09 ENCOUNTER — Encounter (INDEPENDENT_AMBULATORY_CARE_PROVIDER_SITE_OTHER): Payer: Medicare Other

## 2019-10-09 ENCOUNTER — Other Ambulatory Visit: Payer: Self-pay

## 2019-10-09 ENCOUNTER — Encounter (INDEPENDENT_AMBULATORY_CARE_PROVIDER_SITE_OTHER): Payer: Self-pay | Admitting: Nurse Practitioner

## 2019-10-09 VITALS — BP 168/65 | HR 62 | Ht 63.0 in | Wt 123.0 lb

## 2019-10-09 DIAGNOSIS — N186 End stage renal disease: Secondary | ICD-10-CM | POA: Diagnosis not present

## 2019-10-09 DIAGNOSIS — E1169 Type 2 diabetes mellitus with other specified complication: Secondary | ICD-10-CM

## 2019-10-09 DIAGNOSIS — N185 Chronic kidney disease, stage 5: Secondary | ICD-10-CM | POA: Diagnosis not present

## 2019-10-09 DIAGNOSIS — K219 Gastro-esophageal reflux disease without esophagitis: Secondary | ICD-10-CM | POA: Diagnosis not present

## 2019-10-09 DIAGNOSIS — M79604 Pain in right leg: Secondary | ICD-10-CM

## 2019-10-09 DIAGNOSIS — E785 Hyperlipidemia, unspecified: Secondary | ICD-10-CM

## 2019-10-09 DIAGNOSIS — I10 Essential (primary) hypertension: Secondary | ICD-10-CM

## 2019-10-09 NOTE — Progress Notes (Signed)
Subjective:    Patient ID: Lori Stanley, female    DOB: [birth year], 86 y.o.   MRN: [id number] Chief Complaint  Patient presents with  . Follow-up    U/S Follow up    The patient is seen for evaluation for dialysis access. The patient has chronic renal insufficiency stage V secondary to hypertension and diabetes. The patient's most recent creatinine clearance is less than 20. The patient volume status has not yet become an issue. Patient's blood pressures been relatively well controlled. There are mild uremic symptoms which appear to be relatively well tolerated at this time. The patient is right-handed.  The patient has been considering the various methods of dialysis and wishes to proceed with hemodialysis and therefore creation of AV access.  Currently the patient is maintained via a right chest permacath.  She dialyzes on Mondays and Fridays.  The patient also notes that she has some neuropathy symptoms with numbness and tingling in her hands.  The patient denies amaurosis fugax or recent TIA symptoms. There are no recent neurological changes noted. The patient denies claudication symptoms or rest pain symptoms. The patient denies history of DVT, PE or superficial thrombophlebitis. The patient denies recent episodes of angina or shortness of breath.   Today noninvasive studies show that the patient has adequate access for a left brachial axillary graft placement.   Review of Systems  Respiratory: Negative for shortness of breath.   Cardiovascular: Positive for leg swelling.  All other systems reviewed and are negative.      Objective:   Physical Exam Vitals reviewed.  Cardiovascular:     Rate and Rhythm: Normal rate and regular rhythm.     Pulses: Normal pulses.     Heart sounds: Murmur present.  Pulmonary:     Effort: Pulmonary effort is normal.     Breath sounds: Normal breath sounds.  Musculoskeletal:        General: Swelling present.  Skin:    General: Skin is  warm and dry.  Neurological:     Mental Status: She is alert and oriented to person, place, and time.     Motor: Weakness present.     Gait: Gait abnormal.  Psychiatric:        Mood and Affect: Mood normal.        Behavior: Behavior normal.        Thought Content: Thought content normal.        Judgment: Judgment normal.     BP (!) 168/65   Pulse 62   Ht 5\' 3"  (1.6 m)   Wt 123 lb (55.8 kg)   BMI 21.79 kg/m   Past Medical History:  Diagnosis Date  . Acute heart failure ([place])   . CHF (congestive heart failure) ([place])   . Chronic kidney disease   . Chronic low back pain   . Diabetes mellitus without complication ([place])   . Hyperlipidemia   . Hypertension   . MI (myocardial infarction) ([place])   . Obesity     Social History   Socioeconomic History  . Marital status: Widowed    Spouse name: Not on file  . Number of children: 1  . Years of education: Not on file  . Highest education level: 11th grade  Occupational History  . Occupation: retired  Tobacco Use  . Smoking status: Former Smoker    Types: Cigarettes  . Smokeless tobacco: Never Used  . Tobacco comment: Quit in 2015-2016  Substance and Sexual Activity  .  Alcohol use: No  . Drug use: No  . Sexual activity: Never  Other Topics Concern  . Not on file  Social History Narrative   Lives in [place]; self; friends does chores; quit smoking many years; no alcohol. Worked in the [employer].    Social Determinants of Health   Financial Resource Strain: Low Risk   . Difficulty of Paying Living Expenses: Not hard at all  Food Insecurity: No Food Insecurity  . Worried About Charity fundraiser in the Last Year: Never true  . Ran Out of Food in the Last Year: Never true  Transportation Needs: No Transportation Needs  . Lack of Transportation (Medical): No  . Lack of Transportation (Non-Medical): No  Physical Activity: Inactive  . Days of Exercise per Week: 0 days  . Minutes of Exercise per Session: 0 min  Stress: No  Stress Concern Present  . Feeling of Stress : Only a little  Social Connections: Somewhat Isolated  . Frequency of Communication with Friends and Family: More than three times a week  . Frequency of Social Gatherings with Friends and Family: Once a week  . Attends Religious Services: More than 4 times per year  . Active Member of Clubs or Organizations: No  . Attends Archivist Meetings: Never  . Marital Status: Widowed  Intimate Partner Violence: Not At Risk  . Fear of Current or Ex-Partner: No  . Emotionally Abused: No  . Physically Abused: No  . Sexually Abused: No    Past Surgical History:  Procedure Laterality Date  . DIALYSIS/PERMA CATHETER INSERTION N/A 06/07/2019   Procedure: DIALYSIS/PERMA CATHETER INSERTION;  Surgeon: Katha Cabal, MD;  Location: [facility];  Service: Cardiovascular;  Laterality: N/A;  . EYE SURGERY    . KNEE SURGERY    . THYROID SURGERY    . VAGINAL HYSTERECTOMY      Family History  Problem Relation Age of Onset  . Heart attack Mother   . Heart disease Sister   . Cancer Sister     Allergies  Allergen Reactions  . Antihistamines, Chlorpheniramine-Type   . Codeine   . Paxil [Paroxetine]     "makes her feel funny" not in a good way       Assessment & Plan:   1. ESRD (end stage renal disease) ([place]) Recommend:  At this time the patient does not have appropriate extremity access for dialysis  Patient should have a  Left brachial axillary graft created.  The risks, benefits and alternative therapies were reviewed in detail with the patient.  All questions were answered.  The patient agrees to proceed with surgery.    2. Essential hypertension Continue antihypertensive medications as already ordered, these medications have been reviewed and there are no changes at this time.   3. Type 2 diabetes mellitus with hyperlipidemia ([place]) Continue hypoglycemic medications as already ordered, these medications have been  reviewed and there are no changes at this time.  Hgb A1C to be monitored as already arranged by primary service   4. Gastroesophageal reflux disease, unspecified whether esophagitis present Continue PPI as already ordered, this medication has been reviewed and there are no changes at this time.  Avoidence of caffeine and alcohol  Moderate elevation of the head of the bed    Current Outpatient Medications on File Prior to Visit  Medication Sig Dispense Refill  . acetaminophen (TYLENOL) 500 MG tablet Take 500-1,000 mg by mouth every 6 (six) hours as needed for mild  pain or fever.     [name] albuterol (VENTOLIN HFA) 108 (90 Base) MCG/ACT inhaler TAKE 2 PUFFS BY MOUTH EVERY 6 HOURS AS NEEDED FOR WHEEZE OR SHORTNESS OF BREATH 8 g 1  . amLODipine (NORVASC) 5 MG tablet Take 1 tablet (5 mg total) by mouth daily. 90 tablet 3  . ASPIRIN LOW DOSE 81 MG EC tablet TAKE 1 TABLET (81 MG TOTAL) BY MOUTH DAILY. (Patient taking differently: Take 81 mg by mouth daily. ) 90 tablet 1  . Bepotastine Besilate (BEPREVE) 1.5 % SOLN Place 1 drop into both eyes daily.    . brimonidine (ALPHAGAN) 0.2 % ophthalmic solution Place 1 drop into both eyes 2 (two) times daily.     . calcitRIOL (ROCALTROL) 0.25 MCG capsule Take 0.25 mcg by mouth every Monday, Wednesday, and Friday.     . carvedilol (COREG) 12.5 MG tablet TAKE 2 TABLETS (25 MG TOTAL) BY MOUTH 2 (TWO) TIMES DAILY. 360 tablet 3  . dorzolamide-timolol (COSOPT) 22.3-6.8 MG/ML ophthalmic solution Place 1 drop into both eyes 2 (two) times daily.     [name] latanoprost (XALATAN) 0.005 % ophthalmic solution Place 1 drop into the left eye at bedtime.     [name] loratadine (CLARITIN) 10 MG tablet Take 1 tablet (10 mg total) by mouth daily. 90 tablet 3  . sertraline (ZOLOFT) 25 MG tablet TAKE 1 TABLET BY MOUTH EVERY DAY (Patient taking differently: Take 25 mg by mouth daily. ) 90 tablet 2  . spironolactone (ALDACTONE) 25 MG tablet Take 12.5 mg by mouth daily.    [name] telmisartan  (MICARDIS) 20 MG tablet TAKE 1 TABLET BY MOUTH EVERY DAY (Patient taking differently: Take 40 mg by mouth daily. ) 90 tablet 3  . telmisartan (MICARDIS) 40 MG tablet Take 40 mg by mouth daily.    [name] torsemide (DEMADEX) 100 MG tablet Take 100 mg by mouth daily.    . traZODone (DESYREL) 150 MG tablet Take 150 mg by mouth at bedtime.    . Vitamin D, Ergocalciferol, (DRISDOL) 50000 units CAPS capsule Take 50,000 Units by mouth every 7 (seven) days.     No current facility-administered medications on file prior to visit.    There are no Patient Instructions on file for this visit. No follow-ups on file.   Kris Hartmann, NP

## 2019-10-11 DIAGNOSIS — N186 End stage renal disease: Secondary | ICD-10-CM | POA: Diagnosis not present

## 2019-10-11 DIAGNOSIS — Z992 Dependence on renal dialysis: Secondary | ICD-10-CM | POA: Diagnosis not present

## 2019-10-14 DIAGNOSIS — N2581 Secondary hyperparathyroidism of renal origin: Secondary | ICD-10-CM | POA: Diagnosis not present

## 2019-10-14 DIAGNOSIS — N186 End stage renal disease: Secondary | ICD-10-CM | POA: Diagnosis not present

## 2019-10-14 DIAGNOSIS — Z992 Dependence on renal dialysis: Secondary | ICD-10-CM | POA: Diagnosis not present

## 2019-10-14 DIAGNOSIS — J449 Chronic obstructive pulmonary disease, unspecified: Secondary | ICD-10-CM | POA: Diagnosis not present

## 2019-10-14 DIAGNOSIS — I509 Heart failure, unspecified: Secondary | ICD-10-CM | POA: Diagnosis not present

## 2019-10-14 DIAGNOSIS — R062 Wheezing: Secondary | ICD-10-CM | POA: Diagnosis not present

## 2019-10-16 DIAGNOSIS — I5022 Chronic systolic (congestive) heart failure: Secondary | ICD-10-CM | POA: Diagnosis not present

## 2019-10-18 DIAGNOSIS — Z992 Dependence on renal dialysis: Secondary | ICD-10-CM | POA: Diagnosis not present

## 2019-10-18 DIAGNOSIS — N186 End stage renal disease: Secondary | ICD-10-CM | POA: Diagnosis not present

## 2019-10-21 DIAGNOSIS — Z992 Dependence on renal dialysis: Secondary | ICD-10-CM | POA: Diagnosis not present

## 2019-10-21 DIAGNOSIS — N186 End stage renal disease: Secondary | ICD-10-CM | POA: Diagnosis not present

## 2019-10-25 DIAGNOSIS — Z992 Dependence on renal dialysis: Secondary | ICD-10-CM | POA: Diagnosis not present

## 2019-10-25 DIAGNOSIS — N186 End stage renal disease: Secondary | ICD-10-CM | POA: Diagnosis not present

## 2019-10-25 NOTE — Progress Notes (Signed)
[name],acting as a scribe for Lori Durie, MD.,have documented all relevant documentation on the behalf of Lori Durie, MD,as directed by  Lori Durie, MD while in the presence of Lori Durie, MD.  Established patient visit   Patient: Lori Stanley   DOB: 1931/09/21   86 y.o. Female  MRN: [id number] Visit Date: 10/29/2019  Today's healthcare provider: [name], MD   Chief Complaint  Patient presents with  . Hyperlipidemia  . Hypertension  . Diabetes Mellitus   Subjective    HPI  Very sweet 86 year old lady comes in today for follow-up.  She is now getting hemodialysis Mondays and Fridays.  She is not feeling well recently with some nausea and fatigue. New issue recently is a knot on her left perianal area that is tender. Patient's last A1C was 8.5, one month ago.  Patient says that she has a boil on her rectum.   Diabetes Mellitus Type II, follow-up  Lab Results  Component Value Date   HGBA1C 6.0 (A) 07/23/2019   HGBA1C 6.0 (H) 04/10/2019   HGBA1C 6.4 (A) 03/11/2019   Last seen for diabetes 3 months ago.  Management since then includes; This time will stop her insulin and follow-up A1c in 3 months. She reports fair compliance with treatment. She is not having side effects.   Home blood sugar records: trend: stable  Episodes of hypoglycemia? Yes complains of feeling weak.   Current insulin regiment: none --------------------------------------------------------------------------------------------------- Hypertension, follow-up  BP Readings from Last 3 Encounters:  10/29/19 (!) 168/61  10/09/19 (!) 168/65  09/17/19 (!) 144/57   Wt Readings from Last 3 Encounters:  10/29/19 123 lb 6.4 oz (56 kg)  10/09/19 123 lb (55.8 kg)  09/17/19 130 lb 6 oz (59.1 kg)     She was last seen for hypertension 3 months ago.  BP at that visit was 168/65. Management since that visit includes; Fairly good control with blood  pressure today of 148/55.  I am not sure she would tolerate a diastolic lower than this at this time. She reports excellent compliance with treatment. She is not having side effects.  She is not exercising. She is adherent to low salt diet.   Outside blood pressures are not checking, bp machine is broken.  She does not smoke.  Use of agents associated with hypertension: none.   --------------------------------------------------------------------------------------------------- Lipid/Cholesterol, follow-up  Last Lipid Panel: Lab Results  Component Value Date   CHOL 233 (H) 11/05/2018   LDLCALC 163 (H) 11/05/2018   HDL 53 11/05/2018   TRIG 83 11/05/2018    She was last seen for this 11 months ago.  Management since that visit includes.labs checked showing-no changes. She reports excellent compliance with treatment. She is not having side effects.  She is following a Low fat, Low Sodium diet. Current exercise: none  Last metabolic panel Lab Results  Component Value Date   GLUCOSE 156 (H) 09/05/2019   NA 142 09/05/2019   K 3.8 09/05/2019   BUN 36 (H) 09/05/2019   CREATININE 3.00 (H) 09/05/2019   GFRNONAA 14 (L) 09/05/2019   GFRAA 16 (L) 09/05/2019   CALCIUM 9.6 09/05/2019   AST 14 (L) 08/31/2019   ALT 15 08/31/2019   The ASCVD Risk score Mikey Bussing DC Jr., et al., 2013) failed to calculate for the following reasons:   The 2013 ASCVD risk score is only valid for ages 68 to 35   The patient has a prior MI  or stroke diagnosis  ---------------------------------------------------------------------------------------------------   Acute on chronic heart failure with preserved ejection fraction (HFpEF) ([place]) From 07/23/2019-Clinically improved.  Followed by cardiology.  Chronic kidney disease (CKD), stage V ([place]) From 07/23/2019-Per nephrology.  Anemia in stage 5 chronic kidney disease, not on chronic dialysis (Lori Stanley) From 07/23/2019-CBC on next visit if not done by  nephrology.  Social History   Tobacco Use  . Smoking status: Former Smoker    Types: Cigarettes  . Smokeless tobacco: Never Used  . Tobacco comment: Quit in 2015-2016  Vaping Use  . Vaping Use: Never used  Substance Use Topics  . Alcohol use: No  . Drug use: No       Medications: Outpatient Medications Prior to Visit  Medication Sig  . acetaminophen (TYLENOL) 500 MG tablet Take 500-1,000 mg by mouth every 6 (six) hours as needed for mild pain or fever.   [name] albuterol (VENTOLIN HFA) 108 (90 Base) MCG/ACT inhaler TAKE 2 PUFFS BY MOUTH EVERY 6 HOURS AS NEEDED FOR WHEEZE OR SHORTNESS OF BREATH  . amLODipine (NORVASC) 5 MG tablet Take 1 tablet (5 mg total) by mouth daily.  . ASPIRIN LOW DOSE 81 MG EC tablet TAKE 1 TABLET (81 MG TOTAL) BY MOUTH DAILY. (Patient taking differently: Take 81 mg by mouth daily. )  . Bepotastine Besilate (BEPREVE) 1.5 % SOLN Place 1 drop into both eyes daily.  . brimonidine (ALPHAGAN) 0.2 % ophthalmic solution Place 1 drop into both eyes 2 (two) times daily.   . calcitRIOL (ROCALTROL) 0.25 MCG capsule Take 0.25 mcg by mouth every Monday, Wednesday, and Friday.   . carvedilol (COREG) 12.5 MG tablet TAKE 2 TABLETS (25 MG TOTAL) BY MOUTH 2 (TWO) TIMES DAILY.  [name] dorzolamide-timolol (COSOPT) 22.3-6.8 MG/ML ophthalmic solution Place 1 drop into both eyes 2 (two) times daily.   [name] latanoprost (XALATAN) 0.005 % ophthalmic solution Place 1 drop into the left eye at bedtime.   [name] loratadine (CLARITIN) 10 MG tablet Take 1 tablet (10 mg total) by mouth daily.  . sertraline (ZOLOFT) 25 MG tablet TAKE 1 TABLET BY MOUTH EVERY DAY (Patient taking differently: Take 25 mg by mouth daily. )  . spironolactone (ALDACTONE) 25 MG tablet Take 12.5 mg by mouth daily.  [name] telmisartan (MICARDIS) 20 MG tablet TAKE 1 TABLET BY MOUTH EVERY DAY (Patient taking differently: Take 40 mg by mouth daily. )  . telmisartan (MICARDIS) 40 MG tablet Take 40 mg by mouth daily.  [name] torsemide (DEMADEX) 100  MG tablet Take 100 mg by mouth daily.  . traZODone (DESYREL) 150 MG tablet Take 150 mg by mouth at bedtime.  . Vitamin D, Ergocalciferol, (DRISDOL) 50000 units CAPS capsule Take 50,000 Units by mouth every 7 (seven) days.   No facility-administered medications prior to visit.    Review of Systems  Constitutional: Positive for fatigue. Negative for appetite change, chills and fever.  HENT: Negative.   Eyes: Negative.   Respiratory: Negative for chest tightness and shortness of breath.   Cardiovascular: Negative for chest pain and palpitations.  Gastrointestinal: Positive for nausea. Negative for abdominal pain and vomiting.       Perianal tenderness and growth.  Endocrine: Negative.   Allergic/Immunologic: Negative.   Neurological: Negative for dizziness and weakness.  Hematological: Negative.   Psychiatric/Behavioral: Negative.     Last CBC Lab Results  Component Value Date   WBC 5.1 09/11/2019   HGB 8.7 (L) 09/11/2019   HCT 28.2 (L) 09/11/2019   MCV 86.8 09/11/2019  MCH 26.8 09/11/2019   RDW 16.4 (H) 09/11/2019   PLT 256 09/11/2019      Objective    BP (!) 168/61 (BP Location: Right Arm, Patient Position: Sitting, Cuff Size: Small)   Pulse (!) 54   Temp (!) 96.9 F (36.1 C) (Temporal)   Ht 5\' 3"  (1.6 m)   Wt 123 lb 6.4 oz (56 kg)   BMI 21.86 kg/m  BP Readings from Last 3 Encounters:  10/29/19 (!) 152/60  10/29/19 (!) 168/61  10/09/19 (!) 168/65   Wt Readings from Last 3 Encounters:  10/29/19 123 lb (55.8 kg)  10/29/19 123 lb 6.4 oz (56 kg)  10/09/19 123 lb (55.8 kg)      Physical Exam Vitals reviewed.  HENT:     Head: Normocephalic and atraumatic.     Right Ear: External ear normal.     Left Ear: External ear normal.  Eyes:     General: No scleral icterus.    Conjunctiva/sclera: Conjunctivae normal.  Cardiovascular:     Rate and Rhythm: Normal rate and regular rhythm.     Heart sounds: Normal heart sounds. No friction rub. No gallop.   Pulmonary:      Effort: No respiratory distress.     Breath sounds: Normal breath sounds.  Abdominal:     Palpations: Abdomen is soft.  Genitourinary:    Comments: Left perianal region has an abscess that is draining some.  It is firm and tender. Musculoskeletal:     Right lower leg: Edema present.     Left lower leg: Edema present.     Comments: Chronic 1+ LE edema.  Skin:    General: Skin is warm and dry.  Neurological:     General: No focal deficit present.     Mental Status: She is alert and oriented to person, place, and time.  Psychiatric:        Mood and Affect: Mood normal.        Behavior: Behavior normal.        Thought Content: Thought content normal.        Judgment: Judgment normal.     A1c 1 month ago was 8.5.  She has cardiology appointment in a few minutes.  No results found for any visits on 10/29/19.  Assessment & Plan    1. Type 2 diabetes mellitus with hyperlipidemia ([place]) Return to clinic 2 months to recheck A1c.  2. Essential hypertension Fair control.  3. Anemia in stage 5 chronic kidney disease, not on chronic dialysis ([place]) Sees renal twice a week.  4. Perianal abscess Areas prepped with Betadine and anesthesia is with lidocaine with epi.  3/4 inch incision made in abscess and a good amount of pus was drained.  She tolerated the procedure very well and was having less discomfort after the procedure.  5. Acute on chronic heart failure with preserved ejection fraction (HFpEF) ([place]) Followed by Darylene Price at heart failure clinic  6. Chronic kidney disease (CKD), stage V ([place]) On dialysis Monday and Friday  7. Reactive depression We will see how she is doing on next appointment in 2 months.    No follow-ups on file.      I, Lori Durie, MD, have reviewed all documentation for this visit. The documentation on 10/29/19 for the exam, diagnosis, procedures, and orders are all accurate and complete.    Berklie Dethlefs Cranford Mon, MD  [facility] [phone] (phone) [phone] (fax)  [place]

## 2019-10-28 DIAGNOSIS — N186 End stage renal disease: Secondary | ICD-10-CM | POA: Diagnosis not present

## 2019-10-28 DIAGNOSIS — Z992 Dependence on renal dialysis: Secondary | ICD-10-CM | POA: Diagnosis not present

## 2019-10-28 DIAGNOSIS — D509 Iron deficiency anemia, unspecified: Secondary | ICD-10-CM | POA: Diagnosis not present

## 2019-10-29 ENCOUNTER — Ambulatory Visit (INDEPENDENT_AMBULATORY_CARE_PROVIDER_SITE_OTHER): Payer: Medicare Other | Admitting: Family

## 2019-10-29 ENCOUNTER — Other Ambulatory Visit: Payer: Self-pay

## 2019-10-29 ENCOUNTER — Ambulatory Visit (INDEPENDENT_AMBULATORY_CARE_PROVIDER_SITE_OTHER): Payer: Medicare Other | Admitting: Family Medicine

## 2019-10-29 ENCOUNTER — Encounter: Payer: Self-pay | Admitting: Family Medicine

## 2019-10-29 ENCOUNTER — Encounter: Payer: Self-pay | Admitting: Family

## 2019-10-29 VITALS — BP 152/60 | HR 66 | Ht 63.0 in | Wt 123.0 lb

## 2019-10-29 VITALS — BP 168/61 | HR 54 | Temp 96.9°F | Ht 63.0 in | Wt 123.4 lb

## 2019-10-29 DIAGNOSIS — N185 Chronic kidney disease, stage 5: Secondary | ICD-10-CM

## 2019-10-29 DIAGNOSIS — D631 Anemia in chronic kidney disease: Secondary | ICD-10-CM

## 2019-10-29 DIAGNOSIS — Z992 Dependence on renal dialysis: Secondary | ICD-10-CM

## 2019-10-29 DIAGNOSIS — K610 Anal abscess: Secondary | ICD-10-CM

## 2019-10-29 DIAGNOSIS — N186 End stage renal disease: Secondary | ICD-10-CM | POA: Diagnosis not present

## 2019-10-29 DIAGNOSIS — E1169 Type 2 diabetes mellitus with other specified complication: Secondary | ICD-10-CM

## 2019-10-29 DIAGNOSIS — I5033 Acute on chronic diastolic (congestive) heart failure: Secondary | ICD-10-CM

## 2019-10-29 DIAGNOSIS — E785 Hyperlipidemia, unspecified: Secondary | ICD-10-CM | POA: Diagnosis not present

## 2019-10-29 DIAGNOSIS — I5032 Chronic diastolic (congestive) heart failure: Secondary | ICD-10-CM | POA: Diagnosis not present

## 2019-10-29 DIAGNOSIS — F329 Major depressive disorder, single episode, unspecified: Secondary | ICD-10-CM

## 2019-10-29 DIAGNOSIS — I10 Essential (primary) hypertension: Secondary | ICD-10-CM

## 2019-10-29 DIAGNOSIS — E1122 Type 2 diabetes mellitus with diabetic chronic kidney disease: Secondary | ICD-10-CM | POA: Diagnosis not present

## 2019-10-29 NOTE — Patient Instructions (Addendum)
Medication Instructions: Your physician recommends that you continue on your current medications as directed. Please refer to the Current Medication list given to you today.  *If you need a refill on your cardiac medications before your next appointment, please call your pharmacy*  Lab Work: Your physician recommends that you have lab work today(CBC)  If you have labs (blood work) drawn today and your tests are completely normal, you will receive your results only by: [name] MyChart Message (if you have MyChart) OR . A paper copy in the mail If you have any lab test that is abnormal or we need to change your treatment, we will call you to review the results.   Testing/Procedures: None ordered   Follow-Up: At [facility], you and your health needs are our priority.  As part of our continuing mission to provide you with exceptional heart care, we have created designated Provider Care Teams.  These Care Teams include your primary Cardiologist (physician) and Advanced Practice Providers (APPs -  Physician Assistants and Nurse Practitioners) who all work together to provide you with the care you need, when you need it.  We recommend signing up for the patient portal called "MyChart".  Sign up information is provided on this After Visit Summary.  MyChart is used to connect with patients for Virtual Visits (Telemedicine).  Patients are able to view lab/test results, encounter notes, upcoming appointments, etc.  Non-urgent messages can be sent to your provider as well.   To learn more about what you can do with MyChart, go to [email or web address].    Your next appointment: 3 months, [place] preferred

## 2019-10-29 NOTE — Progress Notes (Signed)
Office Visit    Patient Name: Lori Stanley Date of Encounter: 10/29/2019  Primary Care Provider:  Jerrol Banana., MD Primary Cardiologist:  Kathlyn Sacramento, MD Electrophysiologist:  None   Chief Complaint    Lori Stanley is a 86 y.o. female with a hx of diastolic heart failure, HTN, ESRD on HD, anemia, DM2, previous tobacco use, presents today for cardiovascular clearance  Past Medical History    Past Medical History:  Diagnosis Date  . Acute heart failure ([place])   . CHF (congestive heart failure) ([place])   . Chronic kidney disease   . Chronic low back pain   . Diabetes mellitus without complication ([place])   . Hyperlipidemia   . Hypertension   . MI (myocardial infarction) ([place])   . Obesity    Past Surgical History:  Procedure Laterality Date  . DIALYSIS/PERMA CATHETER INSERTION N/A 06/07/2019   Procedure: DIALYSIS/PERMA CATHETER INSERTION;  Surgeon: Katha Cabal, MD;  Location: [facility];  Service: Cardiovascular;  Laterality: N/A;  . EYE SURGERY    . KNEE SURGERY    . THYROID SURGERY    . VAGINAL HYSTERECTOMY      Allergies  Allergies  Allergen Reactions  . Antihistamines, Chlorpheniramine-Type   . Codeine   . Paxil [Paroxetine]     "makes her feel funny" not in a good way    History of Present Illness    Lori Stanley is a 86 y.o. female with a hx of chronic diastolic heart failure, HTN, GERD, DM 2, ESRD last seen at the heart failure clinic in [facility] 09/14/2019.[name]   Hospitalized January 2015 with acute diastolic heart failure in setting of uncontrolled HTN.  Echo LVEF 55 to 60%, moderate LVH, grade 1 diastolic dysfunction.  In 2015 she had a stress test with no evidence of ischemia, normal EF.  Subsequent renal artery duplex with no evidence of RAS.  Hospitalized 06/2019 with dyspnea and pleural effusion.  Treated with empiric antibiotics, transition to torsemide by nephrology, started on HD, carvedilol dose reduced to  allow for more ultrafiltration during HD.  Echocardiogram 06/04/2019 LVEF 60 to 65%, moderate LVH, no RWMA, grade 2 diastolic dysfunction, LA severely dilated, mildly elevated PASP, pleural effusion on left 7 cm, small pericardial effusion.  Admitted 08/2019 for acute on chronic diastolic heart failure/volume overload secondary to ESRD and acute hypoxemic respiratory failure.  She was seen by nephrology and started on hemodialysis.  She was recommended for outpatient hemodialysis Mondays and Fridays.  She is need for permanent dialysis access and is seen today for preoperative cardiovascular clearance.  Plan for left brachial axillary graft.  She was seen by her PCP today and had to have a perianal abscess drained.  Reports no chest pain, pressure, tightness.  She reports no shortness of breath at rest.  Does endorse dyspnea on exertion the stable her baseline.  She lives by herself and takes care of all of her ADLs independently.  She also does work around the house.  She does not have a formal exercise routine as this is often limited by fatigue and previous orthopedic issues.  She endorses trying to follow healthy diet.  She reports no orthopnea, PND.  She reports no lightheadedness, dizziness, near syncope.  She does not monitor blood pressure at home.  Tells me she intermittently has trouble with low blood pressure while hemodialysis and does hold her medications prior to hemodialysis.  EKGs/Labs/Other Studies Reviewed:   The following studies  were reviewed today: Echo 06/05/19  1. Left ventricular ejection fraction, by visual estimation, is 60 to  65%. The left ventricle has normal function. There is moderately increased  left ventricular hypertrophy.   2. The left ventricle has no regional wall motion abnormalities.   3. Left ventricular diastolic parameters are consistent with Grade II  diastolic dysfunction (pseudonormalization).   4. Global right ventricle has normal systolic  function.The right  ventricular size is normal. No increase in right ventricular wall  thickness.   5. Left atrial size was severely dilated.   6. Mildly elevated pulmonary artery systolic pressure.   7. The inferior vena cava is dilated in size with <50% respiratory  variability, suggesting right atrial pressure of 15 mmHg.   8. pleural effusion noted on left, 7 cm   9. Small pericardial effusion.   Lexiscan Myoview No significant ischemia.  EKG:  EKG is  ordered today.  The ekg ordered today demonstrates NSR 66 bpm with no acute ST/T wave changes.  Recent Labs: 06/05/2019: TSH 3.498 06/06/2019: B Natriuretic Peptide 549.0 06/12/2019: Magnesium 2.1 08/31/2019: ALT 15 09/05/2019: BUN 36; Creatinine, Ser 3.00; Potassium 3.8; Sodium 142 09/11/2019: Hemoglobin 8.7; Platelets 256  Recent Lipid Panel    Component Value Date/Time   CHOL 233 (H) 11/05/2018 1158   CHOL 137 05/13/2013 0344   TRIG 83 11/05/2018 1158   TRIG 104 05/13/2013 0344   HDL 53 11/05/2018 1158   HDL 43 05/13/2013 0344   CHOLHDL 4.4 11/05/2018 1158   VLDL 21 05/13/2013 0344   LDLCALC 163 (H) 11/05/2018 1158   LDLCALC 73 05/13/2013 0344    Home Medications   Current Meds  Medication Sig  . acetaminophen (TYLENOL) 500 MG tablet Take 500-1,000 mg by mouth every 6 (six) hours as needed for mild pain or fever.   [name] albuterol (VENTOLIN HFA) 108 (90 Base) MCG/ACT inhaler TAKE 2 PUFFS BY MOUTH EVERY 6 HOURS AS NEEDED FOR WHEEZE OR SHORTNESS OF BREATH  . amLODipine (NORVASC) 5 MG tablet Take 1 tablet (5 mg total) by mouth daily.  [name] aspirin 81 MG chewable tablet Chew 81 mg by mouth daily.  . Bepotastine Besilate (BEPREVE) 1.5 % SOLN Place 1 drop into both eyes daily.  . brimonidine (ALPHAGAN) 0.2 % ophthalmic solution Place 1 drop into both eyes 2 (two) times daily.   . calcitRIOL (ROCALTROL) 0.25 MCG capsule Take 0.25 mcg by mouth every Monday, Wednesday, and Friday.   . carvedilol (COREG) 12.5 MG tablet TAKE 2 TABLETS (25 MG  TOTAL) BY MOUTH 2 (TWO) TIMES DAILY.  [name] dorzolamide-timolol (COSOPT) 22.3-6.8 MG/ML ophthalmic solution Place 1 drop into both eyes 2 (two) times daily.   [name] latanoprost (XALATAN) 0.005 % ophthalmic solution Place 1 drop into the left eye at bedtime.   [name] loratadine (CLARITIN) 10 MG tablet Take 1 tablet (10 mg total) by mouth daily.  . multivitamin (RENA-VIT) TABS tablet Take 1 tablet by mouth 3 (three) times a week.  . sertraline (ZOLOFT) 25 MG tablet Take 25 mg by mouth daily.  [name] spironolactone (ALDACTONE) 25 MG tablet Take 12.5 mg by mouth daily.  [name] telmisartan (MICARDIS) 40 MG tablet Take 40 mg by mouth daily.  [name] torsemide (DEMADEX) 100 MG tablet Take 100 mg by mouth daily.  . traZODone (DESYREL) 150 MG tablet Take 150 mg by mouth at bedtime.  . Vitamin D, Ergocalciferol, (DRISDOL) 50000 units CAPS capsule Take 50,000 Units by mouth every 7 (seven) days.      Review  of Systems   Review of Systems  Constitutional: Negative for chills, fever and malaise/fatigue.  Cardiovascular: Positive for dyspnea on exertion. Negative for chest pain, leg swelling, near-syncope, orthopnea, palpitations and syncope.  Respiratory: Negative for cough, shortness of breath and wheezing.   Gastrointestinal: Negative for nausea and vomiting.  Neurological: Negative for dizziness, light-headedness and weakness.   All other systems reviewed and are otherwise negative except as noted above.  Physical Exam    VS:  BP (!) 152/60 (BP Location: Right Arm, Patient Position: Sitting, Cuff Size: Normal)   Pulse 66   Ht 5\' 3"  (1.6 m)   Wt 123 lb (55.8 kg)   SpO2 97%   BMI 21.79 kg/m  , BMI Body mass index is 21.79 kg/m. GEN: Well nourished, overweight, well developed, in no acute distress. HEENT: normal. Neck: Supple, no JVD, carotid bruits, or masses. Cardiac: RRR, no  rubs, or gallops. Gr 2/6 systolic murmur. No clubbing, cyanosis, edema.  Radials/DP/PT 2+ and equal bilaterally.  Respiratory:  Respirations  regular and unlabored, clear to auscultation bilaterally. GI: Soft, nontender, nondistended, BS + x 4. MS: No deformity or atrophy. Skin: Warm and dry, no rash. Neuro:  Strength and sensation are intact. Psych: Normal affect.  Assessment & Plan    1. Preoperative cardiovascular examination -she has been utilizing her temporarily dialysis access.  Due to her ESRD she requires permanent dialysis access.  Plan for left brachial axillary graft.  She has history of remote MI with details unclear, previous Lexiscan Myoview 2015 low risk study.  Echo 06/2019 with normal LVEF, grade 2 diastolic dysfunction, no regional wall motion abnormalities.  She is moderate risk for any procedure due to her comorbidities including HFpEF, ESRD, DM 2. According to the Revised Cardiac Risk Index (RCRI), her Perioperative Risk of Major Cardiac Event is (%): 6.6. Her  Functional Capacity in METs is: 4.3 according to the Duke Activity Status Index (DASI).  She is deemed acceptable risk for the upcoming procedure with no additional cardiovascular testing required beforehand. She may hold aspirin 81 mg prior to the procedure as directed by surgeon.  Will forward this note to the vascular team as well as nephrology team.  2. Murmur - Gr 5-9/1 systolic murmur best auscultated at RUSB. Echo 06/2019 with mild-moderate AV sclerosis without regurgitation nor stenosis. No signs or symptoms of worsening valvular disease.  Indication for repeat echocardiogram this time.  3. HFpEF -Echo 06/2018 normal LVEF, grade 2 diastolic dysfunction. NYHA II-III. GDMT includes ARB, loop diuretic, MRA, Coreg.  Appreciate recommendations and assistance of nephrology.  She is overall euvolemic on exam her volume status is managed by hemodialysis.  Continue low-sodium, healthy diet.    4. ESRD - Following with nephrology.  Continue HD Mondays and Fridays.  5. Anemia with CKD - Previous referred to hematology by nephrology, she was provided their phone  number to follow-up.  Most recent hemoglobin 09/11/2019 of 8.7.  Repeat CBC today reviewed evaluation of hemoglobin and to monitor her WBC as she had perianal abscess drained today.  6. HTN -BP mildly elevated today however, she just had perianal abscess drained by her primary care provider.  Additionally she reports some episodes of hypotension at HD and we will defer changes to her antihypertensive regimen at this time.  7. DM2 - Continue to follow with PCP.  Disposition: CBC today. Follow up in 3 month(s) with Dr.  Fletcher Anon or APP.  Continue to follow with heart failure clinic as recommended.  Lori Stanley  Gilford Rile, NP 10/29/2019, 1:21 PM

## 2019-10-30 DIAGNOSIS — Z992 Dependence on renal dialysis: Secondary | ICD-10-CM | POA: Diagnosis not present

## 2019-10-30 DIAGNOSIS — N186 End stage renal disease: Secondary | ICD-10-CM | POA: Diagnosis not present

## 2019-10-30 LAB — CBC
Hematocrit: 37.1 % (ref 34.0–46.6)
Hemoglobin: 11.7 g/dL (ref 11.1–15.9)
MCH: 27.3 pg (ref 26.6–33.0)
MCHC: 31.5 g/dL (ref 31.5–35.7)
MCV: 87 fL (ref 79–97)
Platelets: 266 10*3/uL (ref 150–450)
RBC: 4.29 x10E6/uL (ref 3.77–5.28)
RDW: 15.9 % — ABNORMAL HIGH (ref 11.7–15.4)
WBC: 6.1 10*3/uL (ref 3.4–10.8)

## 2019-10-31 ENCOUNTER — Ambulatory Visit (INDEPENDENT_AMBULATORY_CARE_PROVIDER_SITE_OTHER): Payer: Medicare Other

## 2019-10-31 ENCOUNTER — Telehealth (INDEPENDENT_AMBULATORY_CARE_PROVIDER_SITE_OTHER): Payer: Self-pay

## 2019-10-31 DIAGNOSIS — I10 Essential (primary) hypertension: Secondary | ICD-10-CM

## 2019-10-31 DIAGNOSIS — N186 End stage renal disease: Secondary | ICD-10-CM | POA: Diagnosis not present

## 2019-10-31 DIAGNOSIS — E785 Hyperlipidemia, unspecified: Secondary | ICD-10-CM

## 2019-10-31 NOTE — Telephone Encounter (Signed)
Spoke with the patient to scheduled her surgery and she is now scheduled on 11/13/19 for a left brachial axillary graft with Dr. Delana Meyer. Phone call pre-op on 11/07/19 between 8-1 pm and covid testing on 11/12/19 between 8-1 pm at the [facility]. Pre-surgical instructions will be mailed to the patient.

## 2019-10-31 NOTE — Chronic Care Management (AMB) (Signed)
Chronic Care Management   Initial Visit Note  10/31/2019 Name: Lori Stanley MRN: [id number] DOB: [birth year]  Primary Care Provider: Jerrol Banana., MD Reason for referral : Chronic Care Management  Lori Stanley is a 86 y.o. year old female who is a primary care patient of Jerrol Banana., MD. The CCM team was consulted for assistance with chronic disease management and care coordination. A telephonic assessment was conducted today.  Review of Lori Stanley's status, including review of consultants reports, relevant labs and test results was conducted today. Collaboration with appropriate care team members was performed as part of the comprehensive evaluation and provision of chronic care management services.    SDOH (Social Determinants of Health) assessments performed: Yes No further interventions required at this time.    Medications: Outpatient Encounter Medications as of 10/31/2019  Medication Sig  . acetaminophen (TYLENOL) 500 MG tablet Take 500-1,000 mg by mouth every 6 (six) hours as needed for mild pain or fever.   [name] albuterol (VENTOLIN HFA) 108 (90 Base) MCG/ACT inhaler TAKE 2 PUFFS BY MOUTH EVERY 6 HOURS AS NEEDED FOR WHEEZE OR SHORTNESS OF BREATH  . amLODipine (NORVASC) 5 MG tablet Take 1 tablet (5 mg total) by mouth daily.  [name] aspirin 81 MG chewable tablet Chew 81 mg by mouth daily.  . Bepotastine Besilate (BEPREVE) 1.5 % SOLN Place 1 drop into both eyes daily.  . brimonidine (ALPHAGAN) 0.2 % ophthalmic solution Place 1 drop into both eyes 2 (two) times daily.   . calcitRIOL (ROCALTROL) 0.25 MCG capsule Take 0.25 mcg by mouth every Monday, Wednesday, and Friday.   . carvedilol (COREG) 12.5 MG tablet TAKE 2 TABLETS (25 MG TOTAL) BY MOUTH 2 (TWO) TIMES DAILY.  [name] dorzolamide-timolol (COSOPT) 22.3-6.8 MG/ML ophthalmic solution Place 1 drop into both eyes 2 (two) times daily.   [name] latanoprost (XALATAN) 0.005 % ophthalmic solution Place 1 drop into the left  eye at bedtime.   [name] loratadine (CLARITIN) 10 MG tablet Take 1 tablet (10 mg total) by mouth daily.  . multivitamin (RENA-VIT) TABS tablet Take 1 tablet by mouth 3 (three) times a week.  . sertraline (ZOLOFT) 25 MG tablet Take 25 mg by mouth daily.  [name] spironolactone (ALDACTONE) 25 MG tablet Take 12.5 mg by mouth daily.  [name] telmisartan (MICARDIS) 40 MG tablet Take 40 mg by mouth daily.  [name] torsemide (DEMADEX) 100 MG tablet Take 100 mg by mouth daily.  . traZODone (DESYREL) 150 MG tablet Take 150 mg by mouth at bedtime.  . Vitamin D, Ergocalciferol, (DRISDOL) 50000 units CAPS capsule Take 50,000 Units by mouth every 7 (seven) days.   No facility-administered encounter medications on file as of 10/31/2019.     Goals Addressed            This Visit's Progress   . Chronic Care Management       CARE PLAN ENTRY (see longitudinal plan of care for additional care plan information)  Current Barriers:  . Chronic Disease Management support and education needs related HTN, CHF, DM, HLD and ESRD.  Case Manager Clinical Goal(s):  Over the next 120 days, patient will: . Not require hospitalization or emergent care d/t complications r/t chronic illnesses. . Take all medications as prescribed. . Attend all medical appointments as scheduled. . Monitor blood pressure and record readings. . Follow recommended safety measures to prevent fall and injuries. Over the next 45 days, patient will: . Schedule routine eye exam. . Schedule routine podiatry visit.  Interventions:  . Inter-disciplinary care team collaboration (see longitudinal plan of care) . Reviewed medications. Advised to take medications as prescribed. Encouraged to notify provider if unable to tolerate prescribed regimen. Encouraged to notify care management team with concerns regarding medication management or prescription cost. . Discussed established BP parameters and indication for notifying a provider. Encouraged to monitor routinely  if unable to monitor daily and record readings. . Provided information regarding s/sx of CHF complications and indications for seeking immediate medical attention.  . Provided information regarding safety and fall prevention measures. Encouraged to use assistive device as needed when ambulating. Encouraged to keep pathways in the home clear and well lit. . Reviewed pending appointments. Encouraged to attend medical appointments as scheduled to prevent delays in care. Encouraged to notify care management team with concerns regarding transportation. Currently attending dialysis on Monday and Friday. Denies current need for assistance with transportation. Advised to schedule routine eye exam.  . Discussed plan for ongoing care management and follow-up. Provided direct contact information for care management team.   Patient Self Care Activities:  . Self administers medications  . Attends scheduled provider appointments . Calls pharmacy for medication refills . Performs ADL's independently . Performs IADL's independently   Initial goal documentation          Lori Stanley was given information about Chronic Care Management services including:  1. CCM service includes personalized support from designated clinical staff supervised by her physician, including individualized plan of care and coordination with other care providers 2. 24/7 contact phone numbers for assistance for urgent and routine care needs. 3. Service will only be billed when office clinical staff spend 20 minutes or more in a month to coordinate care. 4. Only one practitioner may furnish and bill the service in a calendar month. 5. The patient may stop CCM services at any time (effective at the end of the month) by phone call to the office staff. 6. The patient will be responsible for cost sharing (co-pay) of up to 20% of the service fee (after annual deductible is met).  Patient agreed to services and verbal consent  obtained.    PLAN The care management team will follow-up with Lori Stanley later this month.   [facility] Care Management [phone]

## 2019-11-01 DIAGNOSIS — Z992 Dependence on renal dialysis: Secondary | ICD-10-CM | POA: Diagnosis not present

## 2019-11-01 DIAGNOSIS — N186 End stage renal disease: Secondary | ICD-10-CM | POA: Diagnosis not present

## 2019-11-04 DIAGNOSIS — N186 End stage renal disease: Secondary | ICD-10-CM | POA: Diagnosis not present

## 2019-11-04 DIAGNOSIS — Z992 Dependence on renal dialysis: Secondary | ICD-10-CM | POA: Diagnosis not present

## 2019-11-06 ENCOUNTER — Other Ambulatory Visit (INDEPENDENT_AMBULATORY_CARE_PROVIDER_SITE_OTHER): Payer: Self-pay | Admitting: Nurse Practitioner

## 2019-11-06 ENCOUNTER — Other Ambulatory Visit: Payer: Self-pay | Admitting: Family Medicine

## 2019-11-06 DIAGNOSIS — I10 Essential (primary) hypertension: Secondary | ICD-10-CM

## 2019-11-06 NOTE — Telephone Encounter (Signed)
Requested Prescriptions  Pending Prescriptions Disp Refills   amLODipine (NORVASC) 5 MG tablet [Pharmacy Med Name: AMLODIPINE BESYLATE 5 MG TAB] 90 tablet 1    Sig: TAKE 1 TABLET BY MOUTH EVERY DAY     Cardiovascular:  Calcium Channel Blockers Failed - 11/06/2019  1:31 AM      Failed - Last BP in normal range    BP Readings from Last 1 Encounters:  10/29/19 (!) 152/60         Passed - Valid encounter within last 6 months    Recent Outpatient Visits          1 week ago Type 2 diabetes mellitus with hyperlipidemia [facility])   [facility] Jerrol Banana., MD   3 months ago Type 2 diabetes mellitus with hyperlipidemia [facility])   [facility] Jerrol Banana., MD   4 months ago Acute on chronic diastolic CHF (congestive heart failure) [facility])   [facility] Jerrol Banana., MD   5 months ago Chronic diastolic heart failure [facility])   [facility] Jerrol Banana., MD   5 months ago Primary insomnia   [facility] [place], [place], [place]      Future Appointments            In 1 month Jerrol Banana., MD [facility], [place]   In 2 months Fletcher Anon, Mertie Clause, MD [facility], [place]

## 2019-11-07 ENCOUNTER — Other Ambulatory Visit: Payer: Self-pay

## 2019-11-07 ENCOUNTER — Other Ambulatory Visit
Admission: RE | Admit: 2019-11-07 | Discharge: 2019-11-07 | Disposition: A | Discharge status: Home / Self Care | Payer: Medicare Other | Source: Ambulatory Visit | Attending: Vascular Surgery | Admitting: Vascular Surgery

## 2019-11-07 HISTORY — DX: Anxiety disorder, unspecified: F41.9

## 2019-11-07 HISTORY — DX: Depression, unspecified: F32.A

## 2019-11-07 HISTORY — DX: Anemia, unspecified: D64.9

## 2019-11-07 HISTORY — DX: Gastro-esophageal reflux disease without esophagitis: K21.9

## 2019-11-07 NOTE — Patient Instructions (Addendum)
Your procedure is scheduled on: Wednesday, July 14 Report to Day Surgery on the 2nd floor of the [facility]. To find out your arrival time, please call [phone] between 1PM - 3PM on: Tuesday, July 13  REMEMBER: Instructions that are not followed completely may result in serious medical risk, up to and including death; or upon the discretion of your surgeon and anesthesiologist your surgery may need to be rescheduled.  Do not eat food after midnight the night before surgery.  No gum chewing, lozengers or hard candies.  You may however, drink water up to 2 hours before you are scheduled to arrive for your surgery. Do not drink anything within 2 hours of your scheduled arrival time.  TAKE THESE MEDICATIONS THE MORNING OF SURGERY WITH A SIP OF WATER:  1.  Albuterol inhaler 2.  Amlodipine 3.  Morning eye drops 4.  Carvedilol 5.  Sertraline  Use inhalers on the day of surgery and bring to the hospital.  Stop Anti-inflammatories (NSAIDS) such as Advil, Aleve, Ibuprofen, Motrin, Naproxen, Naprosyn and Aspirin based products such as Excedrin, Goodys Powder, BC Powder. (May take Tylenol or Acetaminophen if needed.)  Stop ANY OVER THE COUNTER supplements until after surgery.  On the morning of surgery brush your teeth with toothpaste and water, you may rinse your mouth with mouthwash if you wish. Do not swallow any toothpaste or mouthwash.  Do not wear jewelry, make-up, hairpins, clips or nail polish.  Do not wear lotions, powders, or perfumes.   Do not shave 48 hours prior to surgery.   Contact lenses, hearing aids and dentures may not be worn into surgery.  Do not bring valuables to the hospital. [facility] is not responsible for any missing/lost belongings or valuables.   Use CHG wipes as directed on instruction sheet.  Notify your doctor if there is any change in your medical condition (cold, fever, infection).  Wear comfortable clothing (specific to your surgery type)  to the hospital.  Plan for stool softeners for home use; pain medications have a tendency to cause constipation. You can also help prevent constipation by eating foods high in fiber such as fruits and vegetables and drinking plenty of fluids as your diet allows.  After surgery, you can help prevent lung complications by doing breathing exercises.  Take deep breaths and cough every 1-2 hours. Your doctor may order a device called an Incentive Spirometer to help you take deep breaths.  If you are being discharged the day of surgery, you will not be allowed to drive home. You will need a responsible adult (18 years or older) to drive you home and stay with you that night.   If you are taking public transportation, you will need to have a responsible adult (18 years or older) with you. Please confirm with your physician that it is acceptable to use public transportation.   Please call the [facility] Dept. at [phone] if you have any questions about these instructions.  Visitation Policy:  Patients undergoing a surgery or procedure may have one family member or support person with them as long as that person is not COVID-19 positive or experiencing its symptoms.  That person may remain in the waiting area during the procedure.  As a reminder, masks are still required for all [facility] team members, patients and visitors in all [facility] facilities.   Systemwide, no visitors 17 or younger.

## 2019-11-08 ENCOUNTER — Other Ambulatory Visit: Payer: Medicare Other

## 2019-11-08 DIAGNOSIS — Z992 Dependence on renal dialysis: Secondary | ICD-10-CM | POA: Diagnosis not present

## 2019-11-08 DIAGNOSIS — N186 End stage renal disease: Secondary | ICD-10-CM | POA: Diagnosis not present

## 2019-11-11 ENCOUNTER — Other Ambulatory Visit: Payer: Medicare Other

## 2019-11-11 DIAGNOSIS — N2581 Secondary hyperparathyroidism of renal origin: Secondary | ICD-10-CM | POA: Diagnosis not present

## 2019-11-11 DIAGNOSIS — Z992 Dependence on renal dialysis: Secondary | ICD-10-CM | POA: Diagnosis not present

## 2019-11-11 DIAGNOSIS — N186 End stage renal disease: Secondary | ICD-10-CM | POA: Diagnosis not present

## 2019-11-12 ENCOUNTER — Other Ambulatory Visit
Admission: RE | Admit: 2019-11-12 | Discharge: 2019-11-12 | Disposition: A | Discharge status: Home / Self Care | Payer: Medicare Other | Source: Ambulatory Visit | Attending: Vascular Surgery | Admitting: Vascular Surgery

## 2019-11-12 ENCOUNTER — Other Ambulatory Visit: Payer: Self-pay

## 2019-11-12 DIAGNOSIS — Z01812 Encounter for preprocedural laboratory examination: Secondary | ICD-10-CM | POA: Diagnosis not present

## 2019-11-12 DIAGNOSIS — Z20822 Contact with and (suspected) exposure to covid-19: Secondary | ICD-10-CM | POA: Diagnosis not present

## 2019-11-12 LAB — BASIC METABOLIC PANEL
Anion gap: 10 (ref 5–15)
BUN: 26 mg/dL — ABNORMAL HIGH (ref 8–23)
CO2: 25 mmol/L (ref 22–32)
Calcium: 9.8 mg/dL (ref 8.9–10.3)
Chloride: 103 mmol/L (ref 98–111)
Creatinine, Ser: 2.79 mg/dL — ABNORMAL HIGH (ref 0.44–1.00)
GFR calc Af Amer: 17 mL/min — ABNORMAL LOW (ref >60)
GFR calc non Af Amer: 15 mL/min — ABNORMAL LOW (ref >60)
Glucose, Bld: 92 mg/dL (ref 70–99)
Potassium: 3.6 mmol/L (ref 3.5–5.1)
Sodium: 138 mmol/L (ref 135–145)

## 2019-11-12 LAB — CBC WITH DIFFERENTIAL/PLATELET
Abs Immature Granulocytes: 0.01 10*3/uL (ref 0.00–0.07)
Basophils Absolute: 0 10*3/uL (ref 0.0–0.1)
Basophils Relative: 1 %
Eosinophils Absolute: 0.1 10*3/uL (ref 0.0–0.5)
Eosinophils Relative: 2 %
HCT: 37.9 % (ref 36.0–46.0)
Hemoglobin: 11.8 g/dL — ABNORMAL LOW (ref 12.0–15.0)
Immature Granulocytes: 0 %
Lymphocytes Relative: 21 %
Lymphs Abs: 1 10*3/uL (ref 0.7–4.0)
MCH: 26.9 pg (ref 26.0–34.0)
MCHC: 31.1 g/dL (ref 30.0–36.0)
MCV: 86.5 fL (ref 80.0–100.0)
Monocytes Absolute: 0.4 10*3/uL (ref 0.1–1.0)
Monocytes Relative: 9 %
Neutro Abs: 3.4 10*3/uL (ref 1.7–7.7)
Neutrophils Relative %: 67 %
Platelets: 264 10*3/uL (ref 150–400)
RBC: 4.38 MIL/uL (ref 3.87–5.11)
RDW: 17.2 % — ABNORMAL HIGH (ref 11.5–15.5)
WBC: 5 10*3/uL (ref 4.0–10.5)
nRBC: 0 % (ref 0.0–0.2)

## 2019-11-12 LAB — APTT: aPTT: 31 seconds (ref 24–36)

## 2019-11-12 LAB — SARS CORONAVIRUS 2 (TAT 6-24 HRS): SARS Coronavirus 2: NEGATIVE

## 2019-11-12 LAB — PROTIME-INR
INR: 1.1 (ref 0.8–1.2)
Prothrombin Time: 13.4 seconds (ref 11.4–15.2)

## 2019-11-12 MED ORDER — CEFAZOLIN SODIUM-DEXTROSE 1-4 GM/50ML-% IV SOLN
1.0000 g | INTRAVENOUS | Status: AC
  Administered 2019-11-13: 2 g via INTRAVENOUS

## 2019-11-12 NOTE — Progress Notes (Signed)
[facility] Perioperative Services  Pre-Admission/Anesthesia Testing Clinical Review  Date: 11/12/19  Patient Demographics:  Name: Lori Stanley DOB:   May 30, 1931 MRN:   [id number]  Planned Surgical Procedure(s):    Case: [id number] Date/Time: 11/13/19 1255   Procedure: INSERTION OF ARTERIOVENOUS (AV) GORE-TEX GRAFT ARM ( BRACHIAL AXILLARY ) (Left )   Anesthesia type: General   Pre-op diagnosis: ESRD   Location: [facility] OR ROOM 07 / [place] ORS FOR ANESTHESIA GROUP   Surgeons: Katha Cabal, MD     NOTE: Available PAT nursing documentation and vital signs have been reviewed. Clinical nursing staff has updated patient's PMH/PSHx, current medication list, and drug allergies/intolerances to ensure comprehensive history available to assist in medical decision making as it pertains to the aforementioned surgical procedure and anticipated anesthetic course.   Clinical Discussion:  Lori Stanley is a 86 y.o. female who is submitted for pre-surgical anesthesia review and clearance prior to her undergoing the above procedure. Patient is a Former Research scientist (life sciences). Pertinent PMH includes: MI (remote; details unclear), ESRD, T2DM, HTN, HLD, HFpEF, GERD (untreated), anemia of chronic disease, secondary hyperparathyroidism, respiratory failure, SOB requiring supplemental oxygen at bedtime, anxiety/depression.  Patient with multiple admissions to the hospital as follows:  Patient admitted in 06/06/2019 - 06/17/2019 for dyspnea and pleural effusion. Presenting oxygen saturations in the ED were 81% on 2L/[place] supplemental oxygen. BUN 61 and creatinine 3.86 (previously 40 and 3.2 in 04/2019). BNP elevated at 577. Influenza and SARS-CoV-2 testing (-). Treated with IV furosemide and admitted to telemetry. Patient developed respiratory distress and became apneic. CODE BLUE was called; patient intubated and transferred to ICU. Patient found to be encephalopathic. Cardiology and nephrology  consulted. Decision was made to place perm cath and start patient on hemodialysis.  Echocardiogram done on 06/04/2019 revealed and LVEF of 60- 65% with no RWMAs (see below for full results). Patient improved with HD and was transferred to med/surg floor where she continued to improve. Patient discharged home on 06/17/2019 on supplemental oxygen.    Patient directly admitted from home on 08/30/2019 for worsening DOE. Of note, patient received 1 unit PRBCs earlier in the week. Patient was dialyzed during her admission. She clinically improved. Patient discharged home on 09/03/2019 with orders for outpatient dialysis on Monday and Fridays.    Patient is followed by cardiology Fletcher Anon, MD). She was last seen in the cardiology clinic on 10/29/2019; notes reviewed. Patient denies chest pain, pressure, and tightness. She denies SOB at rest, however continued to experience exertional dyspnea (baseline for her). Denies orthopnea, PND, vertiginous symptoms. and syncope. She noted intermittent hypotensive episodes at dialysis requiring her to hold her medications. Patient able to independently care for herself and complete all ADLs/IADLs. According to the Revised Cardiac Risk Index (RCRI), her perioperative risk of major cardiac event is 6.6%. Functional Capacity in METs is: 4.3 according to the Duke Activity Status Index (DASI). She is deemed acceptable risk for the upcoming procedure with no additional cardiovascular testing required beforehand; moderate risk stratification.  She denies previous intra-operative complications with anesthesia. This patient is on daily antiplatelet therapy. She has been instructed on recommendations for holding her daily low dose ASA prior to her procedure.   Vitals with BMI 11/07/2019 10/29/2019 10/29/2019  Height 5\' 3"  5\' 3"  5\' 3"   Weight 123 lbs 123 lbs 123 lbs 6 oz  BMI 21.79 89.38 10.17  Systolic - 510 258  Diastolic - 60 61  Pulse - 66 54    Providers/Specialists:  PROVIDER ROLE LAST Anne Hahn, Dolores Lory, MD Vascular Surgeon 10/09/2019  Jerrol Banana., MD Primary Care Provider 10/29/2019  Kathlyn Sacramento, MD Cardiology 10/29/2019   Allergies:  Antihistamines, chlorpheniramine-type; Paxil [paroxetine]; and Codeine  Current Home Medications:   No current facility-administered medications for this encounter.   [name] acetaminophen (TYLENOL) 500 MG tablet  . albuterol (VENTOLIN HFA) 108 (90 Base) MCG/ACT inhaler  . aspirin 81 MG chewable tablet  . Bepotastine Besilate (BEPREVE) 1.5 % SOLN  . brimonidine (ALPHAGAN) 0.2 % ophthalmic solution  . calcitRIOL (ROCALTROL) 0.25 MCG capsule  . carvedilol (COREG) 12.5 MG tablet  . dorzolamide-timolol (COSOPT) 22.3-6.8 MG/ML ophthalmic solution  . latanoprost (XALATAN) 0.005 % ophthalmic solution  . loratadine (CLARITIN) 10 MG tablet  . multivitamin (RENA-VIT) TABS tablet  . sertraline (ZOLOFT) 25 MG tablet  . telmisartan (MICARDIS) 40 MG tablet  . torsemide (DEMADEX) 100 MG tablet  . traZODone (DESYREL) 150 MG tablet  . Vitamin D, Ergocalciferol, (DRISDOL) 50000 units CAPS capsule  . amLODipine (NORVASC) 5 MG tablet  . OXYGEN   History:   Past Medical History:  Diagnosis Date  . Acute heart failure ([place])   . Anemia   . Anxiety   . CHF (congestive heart failure) ([place])   . Chronic kidney disease    stage V; end stage renal disease  . Chronic low back pain   . Depression   . Diabetes mellitus without complication ([place])   . GERD (gastroesophageal reflux disease)   . Hyperlipidemia   . Hypertension   . MI (myocardial infarction) ([place])   . Obesity    Past Surgical History:  Procedure Laterality Date  . ABDOMINAL HYSTERECTOMY    . APPENDECTOMY    . DIALYSIS/PERMA CATHETER INSERTION N/A 06/07/2019   Procedure: DIALYSIS/PERMA CATHETER INSERTION;  Surgeon: Katha Cabal, MD;  Location: [place] CV [facility];  Service: Cardiovascular;  Laterality: N/A;  . EYE SURGERY    . HERNIA  REPAIR    . INCISION AND DRAINAGE PERIRECTAL ABSCESS    . KNEE SURGERY    . THYROID SURGERY    . VAGINAL HYSTERECTOMY     Family History  Problem Relation Age of Onset  . Heart attack Mother   . Heart disease Sister   . Cancer Sister    Social History   Tobacco Use  . Smoking status: Former Smoker    Types: Cigarettes  . Smokeless tobacco: Never Used  . Tobacco comment: Quit in 2015-2016  Vaping Use  . Vaping Use: Never used  Substance Use Topics  . Alcohol use: No  . Drug use: No    Pertinent Clinical Results:  LABS: Labs reviewed: Acceptable for surgery.  Hospital Outpatient Visit on 11/12/2019  Component Date Value Ref Range Status  . aPTT 11/12/2019 31  24 - 36 seconds Final   Performed at [facility], [address] [place],  [address]  . Sodium 11/12/2019 138  135 - 145 mmol/L Final  . Potassium 11/12/2019 3.6  3.5 - 5.1 mmol/L Final  . Chloride 11/12/2019 103  98 - 111 mmol/L Final  . CO2 11/12/2019 25  22 - 32 mmol/L Final  . Glucose, Bld 11/12/2019 92  70 - 99 mg/dL Final   Glucose reference range applies only to samples taken after fasting for at least 8 hours.  . BUN 11/12/2019 26* 8 - 23 mg/dL Final  . Creatinine, Ser 11/12/2019 2.79* 0.44 - 1.00 mg/dL Final  . Calcium 11/12/2019 9.8  8.9 - 10.3 mg/dL Final  . GFR calc non Af Amer 11/12/2019 15* >60 mL/min Final  . GFR calc Af Amer 11/12/2019 17* >60 mL/min Final  . Anion gap 11/12/2019 10  5 - 15 Final   Performed at [facility], [address] [place], [place] [address]  . WBC 11/12/2019 5.0  4.0 - 10.5 K/uL Final  . RBC 11/12/2019 4.38  3.87 - 5.11 MIL/uL Final  . Hemoglobin 11/12/2019 11.8* 12.0 - 15.0 g/dL Final  . HCT 11/12/2019 37.9  36 - 46 % Final  . MCV 11/12/2019 86.5  80.0 - 100.0 fL Final  . MCH 11/12/2019 26.9  26.0 - 34.0 pg Final  . MCHC 11/12/2019 31.1  30.0 - 36.0 g/dL Final  . RDW 11/12/2019 17.2* 11.5 - 15.5 % Final  . Platelets 11/12/2019 264  150 -  400 K/uL Final  . nRBC 11/12/2019 0.0  0.0 - 0.2 % Final  . Neutrophils Relative % 11/12/2019 67  % Final  . Neutro Abs 11/12/2019 3.4  1.7 - 7.7 K/uL Final  . Lymphocytes Relative 11/12/2019 21  % Final  . Lymphs Abs 11/12/2019 1.0  0.7 - 4.0 K/uL Final  . Monocytes Relative 11/12/2019 9  % Final  . Monocytes Absolute 11/12/2019 0.4  0 - 1 K/uL Final  . Eosinophils Relative 11/12/2019 2  % Final  . Eosinophils Absolute 11/12/2019 0.1  0 - 0 K/uL Final  . Basophils Relative 11/12/2019 1  % Final  . Basophils Absolute 11/12/2019 0.0  0 - 0 K/uL Final  . Immature Granulocytes 11/12/2019 0  % Final  . Abs Immature Granulocytes 11/12/2019 0.01  0.00 - 0.07 K/uL Final   Performed at [facility], [address], [place], [place] [address]  . Prothrombin Time 11/12/2019 13.4  11.4 - 15.2 seconds Final  . INR 11/12/2019 1.1  0.8 - 1.2 Final   Comment: (NOTE) INR goal varies based on device and disease states. Performed at [facility], [address] [place],  [address]     EKG: Date: 10/29/2019 Time ECG obtained: 1157 AM Rate: 66 bpm Rhythm: normal sinus Axis (leads I and aVF): Normal Intervals: PR 164 ms. QTc 436 ms. ST segment and T wave changes: No evidence of ST segment elevation or depression Comparison: Similar to previous tracing obtained on 06/05/2019.  IMAGING / PROCEDURES: ECHOCARDIOGRAM done on 06/05/2019 1. Left ventricular ejection fraction, by visual estimation, is 60 to 65%. The left ventricle has normal function. There is moderately increased  left ventricular hypertrophy. 2. The left ventricle has no regional wall motion abnormalities.  3. Left ventricular diastolic parameters are consistent with Grade II diastolic dysfunction (pseudonormalization).  4. Global right ventricle has normal systolic function.The right ventricular size is normal. No increase in right ventricular wall thickness.  5. Left atrial size was severely dilated.   6. Mildly elevated pulmonary artery systolic pressure.  7. The inferior vena cava is dilated in size with <50% respiratory variability, suggesting right atrial pressure of 15 mmHg.  8. Pleural effusion noted on left, 7 cm  9. Small pericardial effusion.   MYOCARDIAL SCAN done on 05/30/2013 1. No significant wall motion abnormality noted.  2. Overall, this is a Low risk scan.  3. Pharmacological myocardial perfusion study with no significant ischemia. 4. The estimated ejection fraction is 64%.  5. The left ventricular global function was normal.  6. There are no EKG changes concerning for ischemia.  7. There is attenuation artifact  noted on this study.  Impression and Plan:  Lori Stanley has been referred for pre-anesthesia review and clearance prior her undergoing the planned anesthetic and procedural courses. Available labs, pertinent testing, and imaging results were personally reviewed by me. This patient has been appropriately cleared by cardiology.   Based on clinical review performed today (11/12/19), barring and significant acute changes in the patient's overall condition, it is anticipated that she will be able to proceed with the planned surgical intervention. Any acute changes in clinical condition may necessitate her procedure being postponed and/or cancelled. Pre-surgical instructions were reviewed with the patient during her PAT appointment and questions were fielded by PAT clinical staff.  Honor Loh, MSN, APRN, FNP-C, CEN [facility]  Peri-operative Services Nurse Practitioner Phone: [phone] 11/12/19 2:17 PM  NOTE: This note has been prepared using Dragon dictation software. Despite my best ability to proofread, there is always the potential that unintentional transcriptional errors may still occur from this process.

## 2019-11-13 ENCOUNTER — Ambulatory Visit: Payer: Medicare Other | Admitting: Urgent Care

## 2019-11-13 ENCOUNTER — Encounter: Payer: Self-pay | Admitting: Vascular Surgery

## 2019-11-13 ENCOUNTER — Ambulatory Visit
Admission: RE | Admit: 2019-11-13 | Discharge: 2019-11-13 | Disposition: A | Discharge status: Home / Self Care | Payer: Medicare Other | Attending: Vascular Surgery | Admitting: Vascular Surgery

## 2019-11-13 ENCOUNTER — Encounter
Admission: RE | Disposition: A | Discharge status: Home / Self Care | Payer: Self-pay | Source: Home / Self Care | Attending: Vascular Surgery

## 2019-11-13 DIAGNOSIS — F419 Anxiety disorder, unspecified: Secondary | ICD-10-CM | POA: Insufficient documentation

## 2019-11-13 DIAGNOSIS — J449 Chronic obstructive pulmonary disease, unspecified: Secondary | ICD-10-CM | POA: Diagnosis not present

## 2019-11-13 DIAGNOSIS — I132 Hypertensive heart and chronic kidney disease with heart failure and with stage 5 chronic kidney disease, or end stage renal disease: Secondary | ICD-10-CM | POA: Insufficient documentation

## 2019-11-13 DIAGNOSIS — I509 Heart failure, unspecified: Secondary | ICD-10-CM | POA: Insufficient documentation

## 2019-11-13 DIAGNOSIS — D649 Anemia, unspecified: Secondary | ICD-10-CM | POA: Diagnosis not present

## 2019-11-13 DIAGNOSIS — Z885 Allergy status to narcotic agent status: Secondary | ICD-10-CM | POA: Insufficient documentation

## 2019-11-13 DIAGNOSIS — I252 Old myocardial infarction: Secondary | ICD-10-CM | POA: Insufficient documentation

## 2019-11-13 DIAGNOSIS — E1122 Type 2 diabetes mellitus with diabetic chronic kidney disease: Secondary | ICD-10-CM | POA: Diagnosis not present

## 2019-11-13 DIAGNOSIS — E785 Hyperlipidemia, unspecified: Secondary | ICD-10-CM | POA: Diagnosis not present

## 2019-11-13 DIAGNOSIS — E669 Obesity, unspecified: Secondary | ICD-10-CM | POA: Diagnosis not present

## 2019-11-13 DIAGNOSIS — N186 End stage renal disease: Secondary | ICD-10-CM | POA: Insufficient documentation

## 2019-11-13 DIAGNOSIS — Z87891 Personal history of nicotine dependence: Secondary | ICD-10-CM | POA: Diagnosis not present

## 2019-11-13 DIAGNOSIS — K219 Gastro-esophageal reflux disease without esophagitis: Secondary | ICD-10-CM | POA: Diagnosis not present

## 2019-11-13 DIAGNOSIS — R062 Wheezing: Secondary | ICD-10-CM | POA: Diagnosis not present

## 2019-11-13 DIAGNOSIS — I5033 Acute on chronic diastolic (congestive) heart failure: Secondary | ICD-10-CM | POA: Diagnosis not present

## 2019-11-13 DIAGNOSIS — I2510 Atherosclerotic heart disease of native coronary artery without angina pectoris: Secondary | ICD-10-CM | POA: Insufficient documentation

## 2019-11-13 DIAGNOSIS — F329 Major depressive disorder, single episode, unspecified: Secondary | ICD-10-CM | POA: Diagnosis not present

## 2019-11-13 DIAGNOSIS — D631 Anemia in chronic kidney disease: Secondary | ICD-10-CM | POA: Diagnosis not present

## 2019-11-13 DIAGNOSIS — T82898A Other specified complication of vascular prosthetic devices, implants and grafts, initial encounter: Secondary | ICD-10-CM | POA: Diagnosis not present

## 2019-11-13 DIAGNOSIS — Z992 Dependence on renal dialysis: Secondary | ICD-10-CM | POA: Diagnosis not present

## 2019-11-13 HISTORY — PX: AV FISTULA PLACEMENT: SHX1204

## 2019-11-13 LAB — TYPE AND SCREEN
ABO/RH(D): O POS
Antibody Screen: NEGATIVE
Extend sample reason: TRANSFUSED

## 2019-11-13 LAB — POCT I-STAT, CHEM 8
BUN: 32 mg/dL — ABNORMAL HIGH (ref 8–23)
Calcium, Ion: 1.35 mmol/L (ref 1.15–1.40)
Chloride: 104 mmol/L (ref 98–111)
Creatinine, Ser: 3.2 mg/dL — ABNORMAL HIGH (ref 0.44–1.00)
Glucose, Bld: 84 mg/dL (ref 70–99)
HCT: 38 % (ref 36.0–46.0)
Hemoglobin: 12.9 g/dL (ref 12.0–15.0)
Potassium: 3.8 mmol/L (ref 3.5–5.1)
Sodium: 140 mmol/L (ref 135–145)
TCO2: 25 mmol/L (ref 22–32)

## 2019-11-13 LAB — GLUCOSE, CAPILLARY
Glucose-Capillary: 81 mg/dL (ref 70–99)
Glucose-Capillary: 88 mg/dL (ref 70–99)

## 2019-11-13 SURGERY — INSERTION OF ARTERIOVENOUS (AV) GORE-TEX GRAFT ARM
Anesthesia: General | Site: Arm Upper | Laterality: Left

## 2019-11-13 MED ORDER — CHLORHEXIDINE GLUCONATE CLOTH 2 % EX PADS: 6.0000 | MEDICATED_PAD | Freq: Once | CUTANEOUS | Status: DC

## 2019-11-13 MED ORDER — GLYCOPYRROLATE 0.2 MG/ML IJ SOLN
INTRAMUSCULAR | Status: DC | PRN
  Administered 2019-11-13: .3 mg via INTRAVENOUS

## 2019-11-13 MED ORDER — LIDOCAINE HCL (CARDIAC) PF 100 MG/5ML IV SOSY
PREFILLED_SYRINGE | INTRAVENOUS | Status: DC | PRN
  Administered 2019-11-13: 60 mg via INTRAVENOUS

## 2019-11-13 MED ORDER — ONDANSETRON HCL 4 MG/2ML IJ SOLN: 4.0000 mg | Freq: Once | INTRAMUSCULAR | Status: DC | PRN

## 2019-11-13 MED ORDER — EPHEDRINE SULFATE 50 MG/ML IJ SOLN
INTRAMUSCULAR | Status: DC | PRN
  Administered 2019-11-13: 5 mg via INTRAVENOUS
  Administered 2019-11-13: 10 mg via INTRAVENOUS

## 2019-11-13 MED ORDER — CEFAZOLIN SODIUM 1 G IJ SOLR
INTRAMUSCULAR | Status: AC
  Filled 2019-11-13: qty 10

## 2019-11-13 MED ORDER — PHENYLEPHRINE HCL (PRESSORS) 10 MG/ML IV SOLN
INTRAVENOUS | Status: DC | PRN
  Administered 2019-11-13: 100 ug via INTRAVENOUS

## 2019-11-13 MED ORDER — PROPOFOL 10 MG/ML IV BOLUS
INTRAVENOUS | Status: DC | PRN
  Administered 2019-11-13: 70 mg via INTRAVENOUS

## 2019-11-13 MED ORDER — ONDANSETRON HCL 4 MG/2ML IJ SOLN
INTRAMUSCULAR | Status: DC | PRN
  Administered 2019-11-13: 4 mg via INTRAVENOUS

## 2019-11-13 MED ORDER — SODIUM CHLORIDE 0.9 % IV SOLN: INTRAVENOUS | Status: DC

## 2019-11-13 MED ORDER — FENTANYL CITRATE (PF) 100 MCG/2ML IJ SOLN: 25.0000 ug | INTRAMUSCULAR | Status: DC | PRN

## 2019-11-13 MED ORDER — EPHEDRINE 5 MG/ML INJ
INTRAVENOUS | Status: AC
  Filled 2019-11-13: qty 10

## 2019-11-13 MED ORDER — ROCURONIUM BROMIDE 10 MG/ML (PF) SYRINGE
PREFILLED_SYRINGE | INTRAVENOUS | Status: AC
  Filled 2019-11-13: qty 10

## 2019-11-13 MED ORDER — FENTANYL CITRATE (PF) 100 MCG/2ML IJ SOLN
INTRAMUSCULAR | Status: DC | PRN
  Administered 2019-11-13 (×2): 50 ug via INTRAVENOUS

## 2019-11-13 MED ORDER — FAMOTIDINE 20 MG PO TABS: 20.0000 mg | ORAL_TABLET | Freq: Once | ORAL | Status: AC

## 2019-11-13 MED ORDER — CEFAZOLIN SODIUM-DEXTROSE 1-4 GM/50ML-% IV SOLN
INTRAVENOUS | Status: AC
  Filled 2019-11-13: qty 50

## 2019-11-13 MED ORDER — GLYCOPYRROLATE 0.2 MG/ML IJ SOLN: INTRAMUSCULAR | Status: DC | PRN

## 2019-11-13 MED ORDER — CHLORHEXIDINE GLUCONATE CLOTH 2 % EX PADS
6.0000 | MEDICATED_PAD | Freq: Once | CUTANEOUS | Status: AC
  Administered 2019-11-13: 6 via TOPICAL

## 2019-11-13 MED ORDER — LIDOCAINE HCL (PF) 2 % IJ SOLN
INTRAMUSCULAR | Status: AC
  Filled 2019-11-13: qty 5

## 2019-11-13 MED ORDER — PHENYLEPHRINE HCL (PRESSORS) 10 MG/ML IV SOLN
INTRAVENOUS | Status: AC
  Filled 2019-11-13: qty 1

## 2019-11-13 MED ORDER — SUCCINYLCHOLINE CHLORIDE 200 MG/10ML IV SOSY
PREFILLED_SYRINGE | INTRAVENOUS | Status: AC
  Filled 2019-11-13: qty 10

## 2019-11-13 MED ORDER — SODIUM CHLORIDE 0.9 % IV SOLN
INTRAVENOUS | Status: DC | PRN
  Administered 2019-11-13: 100 mL via INTRAMUSCULAR

## 2019-11-13 MED ORDER — FAMOTIDINE 20 MG PO TABS
ORAL_TABLET | ORAL | Status: AC
  Administered 2019-11-13: 20 mg via ORAL
  Filled 2019-11-13: qty 1

## 2019-11-13 MED ORDER — OXYCODONE-ACETAMINOPHEN 5-325 MG PO TABS: 1.0000 | ORAL_TABLET | Freq: Four times a day (QID) | ORAL | 0 refills | Status: DC | PRN

## 2019-11-13 MED ORDER — CHLORHEXIDINE GLUCONATE 0.12 % MT SOLN
OROMUCOSAL | Status: AC
  Administered 2019-11-13: 15 mL via OROMUCOSAL
  Filled 2019-11-13: qty 15

## 2019-11-13 MED ORDER — BUPIVACAINE HCL (PF) 0.5 % IJ SOLN
INTRAMUSCULAR | Status: AC
  Filled 2019-11-13: qty 30

## 2019-11-13 MED ORDER — DEXAMETHASONE SODIUM PHOSPHATE 10 MG/ML IJ SOLN
INTRAMUSCULAR | Status: AC
  Filled 2019-11-13: qty 1

## 2019-11-13 MED ORDER — ORAL CARE MOUTH RINSE: 15.0000 mL | Freq: Once | OROMUCOSAL | Status: AC

## 2019-11-13 MED ORDER — CHLORHEXIDINE GLUCONATE 0.12 % MT SOLN: 15.0000 mL | Freq: Once | OROMUCOSAL | Status: AC

## 2019-11-13 MED ORDER — LACTATED RINGERS IV SOLN: INTRAVENOUS | Status: DC | PRN

## 2019-11-13 MED ORDER — PROPOFOL 500 MG/50ML IV EMUL
INTRAVENOUS | Status: AC
  Filled 2019-11-13: qty 50

## 2019-11-13 MED ORDER — ONDANSETRON HCL 4 MG/2ML IJ SOLN
INTRAMUSCULAR | Status: AC
  Filled 2019-11-13: qty 2

## 2019-11-13 MED ORDER — HEPARIN SODIUM (PORCINE) 5000 UNIT/ML IJ SOLN
INTRAMUSCULAR | Status: AC
  Filled 2019-11-13: qty 1

## 2019-11-13 MED ORDER — DEXAMETHASONE SODIUM PHOSPHATE 10 MG/ML IJ SOLN
INTRAMUSCULAR | Status: DC | PRN
  Administered 2019-11-13: 4 mg via INTRAVENOUS

## 2019-11-13 MED ORDER — FENTANYL CITRATE (PF) 100 MCG/2ML IJ SOLN
INTRAMUSCULAR | Status: AC
  Filled 2019-11-13: qty 2

## 2019-11-13 SURGICAL SUPPLY — 59 items
APPLIER CLIP 11 MED OPEN (CLIP)
APPLIER CLIP 9.375 SM OPEN (CLIP)
BAG COUNTER SPONGE EZ (MISCELLANEOUS) IMPLANT
BAG DECANTER FOR FLEXI CONT (MISCELLANEOUS) ×3 IMPLANT
BLADE SURG SZ11 CARB STEEL (BLADE) ×3 IMPLANT
BOOT SUTURE AID YELLOW STND (SUTURE) ×3 IMPLANT
BRUSH SCRUB EZ  4% CHG (MISCELLANEOUS) ×2
BRUSH SCRUB EZ 4% CHG (MISCELLANEOUS) ×1 IMPLANT
CANISTER SUCT 1200ML W/VALVE (MISCELLANEOUS) ×3 IMPLANT
CHLORAPREP W/TINT 26 (MISCELLANEOUS) IMPLANT
CLIP APPLIE 11 MED OPEN (CLIP) IMPLANT
CLIP APPLIE 9.375 SM OPEN (CLIP) IMPLANT
COUNTER SPONGE BAG EZ (MISCELLANEOUS)
COVER WAND RF STERILE (DRAPES) ×3 IMPLANT
DERMABOND ADVANCED (GAUZE/BANDAGES/DRESSINGS) ×2
DERMABOND ADVANCED .7 DNX12 (GAUZE/BANDAGES/DRESSINGS) ×1 IMPLANT
DRESSING SURGICEL FIBRLLR 1X2 (HEMOSTASIS) ×1 IMPLANT
DRSG SURGICEL FIBRILLAR 1X2 (HEMOSTASIS) ×3
DURAPREP 26ML APPLICATOR (WOUND CARE) ×3 IMPLANT
ELECT CAUTERY BLADE 6.4 (BLADE) ×3 IMPLANT
ELECT REM PT RETURN 9FT ADLT (ELECTROSURGICAL) ×3
ELECTRODE REM PT RTRN 9FT ADLT (ELECTROSURGICAL) ×1 IMPLANT
GLOVE BIO SURGEON STRL SZ7 (GLOVE) ×6 IMPLANT
GLOVE INDICATOR 7.5 STRL GRN (GLOVE) ×6 IMPLANT
GLOVE SURG SYN 8.0 (GLOVE) ×3 IMPLANT
GOWN STRL REUS W/ TWL LRG LVL3 (GOWN DISPOSABLE) ×2 IMPLANT
GOWN STRL REUS W/ TWL XL LVL3 (GOWN DISPOSABLE) ×1 IMPLANT
GOWN STRL REUS W/TWL LRG LVL3 (GOWN DISPOSABLE) ×4
GOWN STRL REUS W/TWL XL LVL3 (GOWN DISPOSABLE) ×2
GRAFT PROPATEN STD WALL 4 7X45 (Vascular Products) ×3 IMPLANT
IV NS 500ML (IV SOLUTION) ×2
IV NS 500ML BAXH (IV SOLUTION) ×1 IMPLANT
KIT TURNOVER KIT A (KITS) ×3 IMPLANT
LABEL OR SOLS (LABEL) ×3 IMPLANT
LOOP RED MAXI  1X406MM (MISCELLANEOUS) ×2
LOOP VESSEL MAXI 1X406 RED (MISCELLANEOUS) ×1 IMPLANT
LOOP VESSEL MINI 0.8X406 BLUE (MISCELLANEOUS) ×2 IMPLANT
LOOPS BLUE MINI 0.8X406MM (MISCELLANEOUS) ×4
NEEDLE FILTER BLUNT 18X 1/2SAF (NEEDLE) ×2
NEEDLE FILTER BLUNT 18X1 1/2 (NEEDLE) ×1 IMPLANT
NS IRRIG 500ML POUR BTL (IV SOLUTION) ×3 IMPLANT
PACK EXTREMITY (MISCELLANEOUS) ×3 IMPLANT
PAD PREP 24X41 OB/GYN DISP (PERSONAL CARE ITEMS) ×3 IMPLANT
STOCKINETTE STRL 4IN 9604848 (GAUZE/BANDAGES/DRESSINGS) ×3 IMPLANT
SUT GTX CV-6 30 (SUTURE) ×6 IMPLANT
SUT MNCRL+ 5-0 UNDYED PC-3 (SUTURE) ×1 IMPLANT
SUT MONOCRYL 5-0 (SUTURE) ×2
SUT PROLENE 6 0 BV (SUTURE) ×6 IMPLANT
SUT SILK 2 0 (SUTURE) ×2
SUT SILK 2 0 SH (SUTURE) ×3 IMPLANT
SUT SILK 2-0 18XBRD TIE 12 (SUTURE) ×1 IMPLANT
SUT SILK 3 0 (SUTURE) ×2
SUT SILK 3-0 18XBRD TIE 12 (SUTURE) ×1 IMPLANT
SUT SILK 4 0 (SUTURE) ×2
SUT SILK 4-0 18XBRD TIE 12 (SUTURE) ×1 IMPLANT
SUT VIC AB 3-0 SH 27 (SUTURE) ×2
SUT VIC AB 3-0 SH 27X BRD (SUTURE) ×1 IMPLANT
SYR 20ML LL LF (SYRINGE) ×3 IMPLANT
SYR 3ML LL SCALE MARK (SYRINGE) ×3 IMPLANT

## 2019-11-13 NOTE — Anesthesia Procedure Notes (Signed)
Procedure Name: LMA Insertion Date/Time: 11/13/2019 2:01 PM Performed by: Justus Memory, CRNA Pre-anesthesia Checklist: Patient identified, Patient being monitored, Timeout performed, Emergency Drugs available and Suction available Patient Re-evaluated:Patient Re-evaluated prior to induction Oxygen Delivery Method: Circle system utilized Preoxygenation: Pre-oxygenation with 100% oxygen Induction Type: IV induction Ventilation: Mask ventilation without difficulty LMA: LMA inserted LMA Size: 3.0 Tube type: Oral Number of attempts: 1 Placement Confirmation: positive ETCO2 and breath sounds checked- equal and bilateral Tube secured with: Tape Dental Injury: Teeth and Oropharynx as per pre-operative assessment

## 2019-11-13 NOTE — H&P (Signed)
[facility] Admission History & Physical  MRN : [id number]  Lori Stanley is a 86 y.o. (27-Nov-1931) female who presents with chief complaint of No chief complaint on file. Lori Stanley  History of Present Illness:    The patient is here for creation of her dialysis access.  Patient is here for her first arm access.  Current access is via a catheter which is functioning poorly.  There have been several episodes of catheter infection.  The patient denies amaurosis fugax or recent TIA symptoms. There are no recent neurological changes noted. The patient denies claudication symptoms or rest pain symptoms. The patient denies history of DVT, PE or superficial thrombophlebitis. The patient denies recent episodes of angina or shortness of breath.    Current Facility-Administered Medications  Medication Dose Route Frequency Provider Last Rate Last Admin  . 0.9 %  sodium chloride infusion   Intravenous Continuous Gunnar Fusi, MD 10 mL/hr at 11/13/19 1225 New Bag at 11/13/19 1225  . ceFAZolin (ANCEF) 1-4 GM/50ML-% IVPB           . ceFAZolin (ANCEF) IVPB 1 g/50 mL premix  1 g Intravenous On Call to [place], [place], NP      . Chlorhexidine Gluconate Cloth 2 % PADS 6 each  6 each Topical Once Kris Hartmann, NP        Past Medical History:  Diagnosis Date  . Acute heart failure ([place])   . Anemia   . Anxiety   . CHF (congestive heart failure) ([place])   . Chronic kidney disease    stage V; end stage renal disease  . Chronic low back pain   . Depression   . Diabetes mellitus without complication ([place])   . GERD (gastroesophageal reflux disease)   . Hyperlipidemia   . Hypertension   . MI (myocardial infarction) ([place])   . Obesity     Past Surgical History:  Procedure Laterality Date  . ABDOMINAL HYSTERECTOMY    . APPENDECTOMY    . DIALYSIS/PERMA CATHETER INSERTION N/A 06/07/2019   Procedure: DIALYSIS/PERMA CATHETER INSERTION;  Surgeon: Katha Cabal, MD;   Location: [place] [facility];  Service: Cardiovascular;  Laterality: N/A;  . EYE SURGERY    . HERNIA REPAIR    . INCISION AND DRAINAGE PERIRECTAL ABSCESS    . KNEE SURGERY    . THYROID SURGERY    . VAGINAL HYSTERECTOMY      Social History Social History   Tobacco Use  . Smoking status: Former Smoker    Types: Cigarettes  . Smokeless tobacco: Never Used  . Tobacco comment: Quit in 2015-2016  Vaping Use  . Vaping Use: Never used  Substance Use Topics  . Alcohol use: No  . Drug use: No    Family History Family History  Problem Relation Age of Onset  . Heart attack Mother   . Heart disease Sister   . Cancer Sister     No family history of bleeding or clotting disorders, autoimmune disease or porphyria  Allergies  Allergen Reactions  . Antihistamines, Chlorpheniramine-Type Other (See Comments)    Unknown reaction.  Lori Stanley Paxil [Paroxetine]     "makes her feel funny" not in a good way  . Codeine Rash and Other (See Comments)    Mouth sore     REVIEW OF SYSTEMS (Negative unless checked)  Constitutional: [] Weight loss  [] Fever  [] Chills Cardiac: [] Chest pain   [] Chest pressure   [] Palpitations   [] Shortness of  breath when laying flat   [] Shortness of breath at rest   [x] Shortness of breath with exertion. Vascular:  [] Pain in legs with walking   [] Pain in legs at rest   [] Pain in legs when laying flat   [] Claudication   [] Pain in feet when walking  [] Pain in feet at rest  [] Pain in feet when laying flat   [] History of DVT   [] Phlebitis   [] Swelling in legs   [] Varicose veins   [] Non-healing ulcers Pulmonary:   [] Uses home oxygen   [] Productive cough   [] Hemoptysis   [] Wheeze  [] COPD   [] Asthma Neurologic:  [] Dizziness  [] Blackouts   [] Seizures   [] History of stroke   [] History of TIA  [] Aphasia   [] Temporary blindness   [] Dysphagia   [] Weakness or numbness in arms   [] Weakness or numbness in legs Musculoskeletal:  [] Arthritis   [] Joint swelling   [] Joint pain   [] Low back  pain Hematologic:  [] Easy bruising  [] Easy bleeding   [] Hypercoagulable state   [] Anemic  [] Hepatitis Gastrointestinal:  [] Blood in stool   [] Vomiting blood  [] Gastroesophageal reflux/heartburn   [] Difficulty swallowing. Genitourinary:  [x] Chronic kidney disease   [] Difficult urination  [] Frequent urination  [] Burning with urination   [] Blood in urine Skin:  [] Rashes   [] Ulcers   [] Wounds Psychological:  [] History of anxiety   []  History of major depression.  Physical Examination  Vitals:   11/13/19 1153  BP: (!) 157/66  Pulse: 63  Resp: 20  Temp: 98.2 F (36.8 C)  TempSrc: Temporal  SpO2: 95%   There is no height or weight on file to calculate BMI. Gen: WD/WN, NAD Head: [place]/AT, No temporalis wasting. Prominent temp pulse not noted. Ear/Nose/Throat: Hearing grossly intact, nares w/o erythema or drainage, oropharynx w/o Erythema/Exudate,  Eyes: Conjunctiva clear, sclera non-icteric Neck: Trachea midline.  No JVD.  Pulmonary:  Good air movement, respirations not labored, no use of accessory muscles.  Cardiac: RRR, normal S1, S2. Vascular:  Tunneled catheter DC&I Vessel Right Left  Radial Palpable Palpable  Ulnar Not Palpable Not Palpable  Brachial Palpable Palpable  Carotid Palpable, without bruit Palpable, without bruit  Gastrointestinal: soft, non-tender/non-distended. No guarding/reflex.  Musculoskeletal: M/S 5/5 throughout.  Extremities without ischemic changes.  No deformity or atrophy.  Neurologic: Sensation grossly intact in extremities.  Symmetrical.  Speech is fluent. Motor exam as listed above. Psychiatric: Judgment intact, Mood & affect appropriate for pt's clinical situation. Dermatologic: No rashes or ulcers noted.  No cellulitis or open wounds.    CBC Lab Results  Component Value Date   WBC 5.0 11/12/2019   HGB 12.9 11/13/2019   HCT 38.0 11/13/2019   MCV 86.5 11/12/2019   PLT 264 11/12/2019    BMET    Component Value Date/Time   NA 140 11/13/2019 1215    NA 143 04/10/2019 1353   NA 132 (L) 05/15/2013 0512   K 3.8 11/13/2019 1215   K 4.0 05/15/2013 0512   CL 104 11/13/2019 1215   CL 99 05/15/2013 0512   CO2 25 11/12/2019 1158   CO2 28 05/15/2013 0512   GLUCOSE 84 11/13/2019 1215   GLUCOSE 95 05/15/2013 0512   BUN 32 (H) 11/13/2019 1215   BUN 40 (H) 04/10/2019 1353   BUN 80 (H) 05/15/2013 0512   CREATININE 3.20 (H) 11/13/2019 1215   CREATININE 2.27 (H) 05/15/2013 0512   CALCIUM 9.8 11/12/2019 1158   CALCIUM 8.0 (L) 05/15/2013 0512   GFRNONAA 15 (L) 11/12/2019 1158  GFRNONAA 20 (L) 05/15/2013 0512   GFRAA 17 (L) 11/12/2019 1158   GFRAA 23 (L) 05/15/2013 9480   Estimated Creatinine Clearance: 10.6 mL/min (A) (by C-G formula based on SCr of 3.2 mg/dL (H)).  COAG Lab Results  Component Value Date   INR 1.1 11/12/2019   INR 1.2 06/07/2019    Radiology No results found.  Assessment/Plan 1.  Complication dialysis device with thrombosis AV access:  Patient's here for creation of a left brachial axillary AV graft.  The patient will undergo creation in surgery.  The risks and benefits were described to the patient.  All questions were answered.  The patient agrees to proceed with surgery. Potassium will be drawn to ensure that it is an appropriate level prior to performing intervention. 2.  End-stage renal disease requiring hemodialysis:  Patient will continue dialysis therapy without further interruption if a successful intervention is not achieved then a tunneled catheter will be placed. Dialysis has already been arranged. 3.  Hypertension:  Patient will continue medical management; nephrology is following no changes in oral medications. 4. Diabetes mellitus:  Glucose will be monitored and oral medications been held this morning once the patient has undergone the patient's procedure po intake will be reinitiated and again Accu-Cheks will be used to assess the blood glucose level and treat as needed. The patient will be restarted on  the patient's usual hypoglycemic regime 5.  Coronary artery disease:  EKG will be monitored. Nitrates will be used if needed. The patient's oral cardiac medications will be continued.    [name], MD  11/13/2019 1:34 PM

## 2019-11-13 NOTE — Op Note (Signed)
OPERATIVE NOTE   PROCEDURE: left brachial axillary arteriovenous graft placement  PRE-OPERATIVE DIAGNOSIS: End Stage Renal Disease  POST-OPERATIVE DIAGNOSIS: End Stage Renal Disease  SURGEON: [name]  ASSISTANT(S): Ms. Hezzie Bump  ANESTHESIA: general  ESTIMATED BLOOD LOSS: <50 cc  FINDING(S): 3 mm brachial artery and a 10 mm axillary vein  SPECIMEN(S):  none  INDICATIONS:   Lori Stanley is a 86 y.o. female who presents with end stage renal disease.  The patient is scheduled for left brachial axillary AV graft placement.  The patient is aware the risks include but are not limited to: bleeding, infection, steal syndrome, nerve damage, ischemic monomelic neuropathy, failure to mature, and need for additional procedures.  The patient is aware of the risks of the procedure and elects to proceed forward.  DESCRIPTION: After full informed written consent was obtained from the patient, the patient was brought back to the operating room and placed supine upon the operating table.  Prior to induction, the patient received IV antibiotics.   After obtaining adequate anesthesia, the patient was then prepped and draped in the standard fashion for a left arm access procedure.   A first assistant was required to provide a safe and appropriate environment for executing the surgery.  The assistant was integral in providing retraction, exposure, running suture providing suction and in the closing process.   A linear incision was then created along the medial border of the biceps muscle just proximal to the antecubital crease and the brachial artery which was exposed through. The brachial artery was then looped proximally and distally with Silastic Vesseloops. Side branches were controlled with 4-0 silk ties.  Attention was then turned to the exposure of the axillary vein. Linear incision was then created medial to the proximal portion of the biceps at the level of the anterior  axillary crease. The axillary vein was exposed and again looped proximally and distally with Silastic vessel loops. Associated tributaries were also controlled with Silastic Vesseloops.  The Gore tunneler was then delivered onto the field and a subcutaneous path was made from the arterial incision to the venous incision. A 4-7 tapered PTFE propatent graft by Simeon Craft was then pulled through the subcutaneous tunnel. The arterial 4 mm portion was then approximated to the brachial artery. Brachial artery was controlled proximally and distally with the Silastic Vesseloops. Arteriotomy was made with an 11 blade scalpel and extended with Potts scissors and a 6-0 Prolene stay suture was placed. End graft to side brachial artery anastomosis was then fashioned with running CV 6 suture. Flushing maneuvers were performed suture line was hemostatic and the graft was then assessed for proper position and ease of future cannulation. Heparinized saline was infused into the vein and the graft was clamped with a vascular clamp. With the graft pressurized it was approximated to the axillary vein in its native bed and then marked with a surgical marker. The vein was then delivered into the surgical field and controlled with the Silastic vessel loops. Venotomy was then made with an 11 blade scalpel and extended with Potts scissors and a 6-0 Prolene suture was used as stay suture. The the graft was then sewn to the vein in an end graft to side vein fashion using running CV 6 suture.  Flushing maneuvers were performed and the artery was allowed to forward and back bleed.  Flow was then established through the AV graft  There was good  thrill in the venous outflow, and there was 1+  palpable radial pulse.  At this point, I irrigated out the surgical wounds.  There was no further active bleeding.  The subcutaneous tissue was reapproximated with a running stitch of 3-0 Vicryl.  The skin was then reapproximated with a running subcuticular  stitch of 4-0 Vicryl.  The skin was then cleaned, dried, and reinforced with Dermabond.    The patient tolerated this procedure well.   COMPLICATIONS: None  CONDITION: Lori Stanley [facility]  Office: [phone]   11/13/2019, 3:57 PM

## 2019-11-13 NOTE — Transfer of Care (Signed)
Immediate Anesthesia Transfer of Care Note  Patient: Lori Stanley [facility]  Procedure(s) Performed: INSERTION OF ARTERIOVENOUS (AV) GORE-TEX GRAFT ARM ( BRACHIAL AXILLARY ) (Left Arm Upper)  Patient Location: PACU  Anesthesia Type:General  Level of Consciousness: sedated  Airway & Oxygen Therapy: Patient Spontanous Breathing and Patient connected to face mask oxygen  Post-op Assessment: Report given to RN and Post -op Vital signs reviewed and stable  Post vital signs: Reviewed and stable  Last Vitals:  Vitals Value Taken Time  BP 141/57 11/13/19 1542  Temp    Pulse 68 11/13/19 1547  Resp 12 11/13/19 1547  SpO2 100 % 11/13/19 1547  Vitals shown include unvalidated device data.  Last Pain:  Vitals:   11/13/19 1153  TempSrc: Temporal  PainSc: 0-No pain         Complications: No complications documented.

## 2019-11-13 NOTE — Anesthesia Preprocedure Evaluation (Addendum)
Anesthesia Evaluation  Patient identified by MRN, date of birth, ID band Patient awake    Reviewed: Allergy & Precautions, NPO status , Patient's Chart, lab work & pertinent test results  History of Anesthesia Complications Negative for: history of anesthetic complications  Airway Mallampati: II  TM Distance: >3 FB Neck ROM: Full    Dental  (+) Edentulous Upper, Poor Dentition, Missing   Pulmonary neg sleep apnea, neg COPD, former smoker,    breath sounds clear to auscultation- rhonchi (-) wheezing      Cardiovascular hypertension, Pt. on medications + CAD, + Past MI and +CHF  (-) Cardiac Stents and (-) CABG  Rhythm:Regular Rate:Normal - Systolic murmurs and - Diastolic murmurs Echo 05/07/92: 1. Left ventricular ejection fraction, by visual estimation, is 60 to  65%. The left ventricle has normal function. There is moderately increased  left ventricular hypertrophy.  2. The left ventricle has no regional wall motion abnormalities.  3. Left ventricular diastolic parameters are consistent with Grade II  diastolic dysfunction (pseudonormalization).  4. Global right ventricle has normal systolic function.The right  ventricular size is normal. No increase in right ventricular wall  thickness.  5. Left atrial size was severely dilated.  6. Mildly elevated pulmonary artery systolic pressure.  7. The inferior vena cava is dilated in size with <50% respiratory  variability, suggesting right atrial pressure of 15 mmHg.  8. pleural effusion noted on left, 7 cm  9. Small pericardial effusion.    Neuro/Psych neg Seizures PSYCHIATRIC DISORDERS Anxiety Depression negative neurological ROS     GI/Hepatic Neg liver ROS, GERD  ,  Endo/Other  diabetes  Renal/GU ESRF and DialysisRenal disease     Musculoskeletal  (+) Arthritis ,   Abdominal (+) - obese,   Peds  Hematology  (+) anemia ,   Anesthesia Other Findings Past  Medical History: No date: Acute heart failure (HCC) No date: Anemia No date: Anxiety No date: CHF (congestive heart failure) (HCC) No date: Chronic kidney disease     Comment:  stage V; end stage renal disease No date: Chronic low back pain No date: Depression No date: Diabetes mellitus without complication (HCC) No date: GERD (gastroesophageal reflux disease) No date: Hyperlipidemia No date: Hypertension No date: MI (myocardial infarction) ([place]) No date: Obesity   Reproductive/Obstetrics                            Anesthesia Physical Anesthesia Plan  ASA: IV  Anesthesia Plan: General   Post-op Pain Management:    Induction: Intravenous  PONV Risk Score and Plan: 2 and Ondansetron and Dexamethasone  Airway Management Planned: LMA  Additional Equipment:   Intra-op Plan:   Post-operative Plan:   Informed Consent: I have reviewed the patients History and Physical, chart, labs and discussed the procedure including the risks, benefits and alternatives for the proposed anesthesia with the patient or authorized representative who has indicated his/her understanding and acceptance.     Dental advisory given  Plan Discussed with: CRNA and Anesthesiologist  Anesthesia Plan Comments:         Anesthesia Quick Evaluation

## 2019-11-13 NOTE — Discharge Instructions (Signed)

## 2019-11-14 ENCOUNTER — Encounter: Payer: Self-pay | Admitting: Vascular Surgery

## 2019-11-15 ENCOUNTER — Telehealth: Payer: Self-pay

## 2019-11-15 DIAGNOSIS — N186 End stage renal disease: Secondary | ICD-10-CM | POA: Diagnosis not present

## 2019-11-15 DIAGNOSIS — I5022 Chronic systolic (congestive) heart failure: Secondary | ICD-10-CM | POA: Diagnosis not present

## 2019-11-15 DIAGNOSIS — Z992 Dependence on renal dialysis: Secondary | ICD-10-CM | POA: Diagnosis not present

## 2019-11-15 NOTE — Telephone Encounter (Signed)
PC to pt.  Stated she was made aware of [place] [employer] contamination.  Advised pt. That this was in a localized area only.  It is not felt to be widespread.  Reassured pt. That there was no contaminated water detected at [facility].  Advised to report any diarrhea, stomach cramps, or nausea and vomiting to her PCP.  Pt. Verb. Understanding.  Stated she has Well water at her residence.

## 2019-11-17 NOTE — Patient Instructions (Addendum)
Thank you for allowing the Chronic Care Management team to participate in your care.  Goals Addressed            This Visit's Progress   . Chronic Care Management       CARE PLAN ENTRY (see longitudinal plan of care for additional care plan information)  Current Barriers:  . Chronic Disease Management support and education needs related HTN, CHF, DM, HLD and ESRD.  Case Manager Clinical Goal(s):  Over the next 120 days, patient will: . Not require hospitalization or emergent care d/t complications r/t chronic illnesses. . Take all medications as prescribed. . Attend all medical appointments as scheduled. . Monitor blood pressure and record readings. . Follow recommended safety measures to prevent fall and injuries. Over the next 45 days, patient will: . Schedule routine eye exam. . Schedule routine podiatry visit.    Interventions:  . Inter-disciplinary care team collaboration (see longitudinal plan of care) . Reviewed medications. Advised to take medications as prescribed. Encouraged to notify provider if unable to tolerate prescribed regimen. Encouraged to notify care management team with concerns regarding medication management or prescription cost. . Discussed established BP parameters and indication for notifying a provider. Encouraged to monitor routinely if unable to monitor daily and record readings. . Provided information regarding s/sx of CHF complications and indications for seeking immediate medical attention.  . Provided information regarding safety and fall prevention measures. Encouraged to use assistive device as needed when ambulating. Encouraged to keep pathways in the home clear and well lit. . Reviewed pending appointments. Encouraged to attend medical appointments as scheduled to prevent delays in care. Encouraged to notify care management team with concerns regarding transportation. Currently attending dialysis on Monday and Friday. Denies current need for  assistance with transportation. Advised to schedule routine eye exam.  . Discussed plan for ongoing care management and follow-up. Provided direct contact information for care management team.   Patient Self Care Activities:  . Self administers medications  . Attends scheduled provider appointments . Calls pharmacy for medication refills . Performs ADL's independently . Performs IADL's independently   Initial goal documentation          Lori Stanley was given information about Chronic Care Management services including:  1. CCM service includes personalized support from designated clinical staff supervised by her physician, including individualized plan of care and coordination with other care providers 2. 24/7 contact phone numbers for assistance for urgent and routine care needs. 3. Service will only be billed when office clinical staff spend 20 minutes or more in a month to coordinate care. 4. Only one practitioner may furnish and bill the service in a calendar month. 5. The patient may stop CCM services at any time (effective at the end of the month) by phone call to the office staff. 6. The patient will be responsible for cost sharing (co-pay) of up to 20% of the service fee (after annual deductible is met).  Patient agreed to services and verbal consent obtained.    Lori Stanley verbalized understanding of the information discussed during the telephonic outreach today. Declined need for a mailed/printed copy of the instructions.   The care management team will follow-up with Lori Stanley later this month.   [facility] Care Management [phone]

## 2019-11-18 DIAGNOSIS — N186 End stage renal disease: Secondary | ICD-10-CM | POA: Diagnosis not present

## 2019-11-18 DIAGNOSIS — Z992 Dependence on renal dialysis: Secondary | ICD-10-CM | POA: Diagnosis not present

## 2019-11-22 DIAGNOSIS — N186 End stage renal disease: Secondary | ICD-10-CM | POA: Diagnosis not present

## 2019-11-22 DIAGNOSIS — Z992 Dependence on renal dialysis: Secondary | ICD-10-CM | POA: Diagnosis not present

## 2019-11-25 DIAGNOSIS — Z992 Dependence on renal dialysis: Secondary | ICD-10-CM | POA: Diagnosis not present

## 2019-11-25 DIAGNOSIS — D509 Iron deficiency anemia, unspecified: Secondary | ICD-10-CM | POA: Diagnosis not present

## 2019-11-25 DIAGNOSIS — N186 End stage renal disease: Secondary | ICD-10-CM | POA: Diagnosis not present

## 2019-11-25 NOTE — Anesthesia Postprocedure Evaluation (Signed)
Anesthesia Post Note  Patient: Lori Stanley [facility]  Procedure(s) Performed: INSERTION OF ARTERIOVENOUS (AV) GORE-TEX GRAFT ARM ( BRACHIAL AXILLARY ) (Left Arm Upper)  Patient location during evaluation: PACU Anesthesia Type: General Level of consciousness: awake and alert and oriented Pain management: pain level controlled Vital Signs Assessment: post-procedure vital signs reviewed and stable Respiratory status: spontaneous breathing Cardiovascular status: blood pressure returned to baseline Anesthetic complications: no   No complications documented.   Last Vitals:  Vitals:   11/13/19 1623 11/13/19 1638  BP:  (!) 153/75  Pulse:  81  Resp:  20  Temp: (!) 36.4 C (!) 36.1 C  SpO2: 95% 94%    Last Pain:  Vitals:   11/14/19 1049  TempSrc:   PainSc: 0-No pain                 Rozell Kettlewell

## 2019-11-28 ENCOUNTER — Telehealth: Payer: Self-pay

## 2019-11-28 DIAGNOSIS — B351 Tinea unguium: Secondary | ICD-10-CM | POA: Diagnosis not present

## 2019-11-28 DIAGNOSIS — E1140 Type 2 diabetes mellitus with diabetic neuropathy, unspecified: Secondary | ICD-10-CM | POA: Diagnosis not present

## 2019-11-29 DIAGNOSIS — Z992 Dependence on renal dialysis: Secondary | ICD-10-CM | POA: Diagnosis not present

## 2019-11-29 DIAGNOSIS — N186 End stage renal disease: Secondary | ICD-10-CM | POA: Diagnosis not present

## 2019-11-30 DIAGNOSIS — N186 End stage renal disease: Secondary | ICD-10-CM | POA: Diagnosis not present

## 2019-11-30 DIAGNOSIS — Z992 Dependence on renal dialysis: Secondary | ICD-10-CM | POA: Diagnosis not present

## 2019-12-02 DIAGNOSIS — N186 End stage renal disease: Secondary | ICD-10-CM | POA: Diagnosis not present

## 2019-12-02 DIAGNOSIS — Z992 Dependence on renal dialysis: Secondary | ICD-10-CM | POA: Diagnosis not present

## 2019-12-02 DIAGNOSIS — Z23 Encounter for immunization: Secondary | ICD-10-CM | POA: Diagnosis not present

## 2019-12-04 ENCOUNTER — Other Ambulatory Visit: Payer: Self-pay

## 2019-12-04 ENCOUNTER — Other Ambulatory Visit (INDEPENDENT_AMBULATORY_CARE_PROVIDER_SITE_OTHER): Payer: Self-pay | Admitting: Vascular Surgery

## 2019-12-04 ENCOUNTER — Encounter (INDEPENDENT_AMBULATORY_CARE_PROVIDER_SITE_OTHER): Payer: Self-pay | Admitting: Nurse Practitioner

## 2019-12-04 ENCOUNTER — Ambulatory Visit (INDEPENDENT_AMBULATORY_CARE_PROVIDER_SITE_OTHER): Payer: Medicare Other | Admitting: Nurse Practitioner

## 2019-12-04 ENCOUNTER — Ambulatory Visit (INDEPENDENT_AMBULATORY_CARE_PROVIDER_SITE_OTHER): Payer: Medicare Other

## 2019-12-04 VITALS — BP 167/67 | HR 73 | Resp 16 | Wt 118.0 lb

## 2019-12-04 DIAGNOSIS — Z95828 Presence of other vascular implants and grafts: Secondary | ICD-10-CM

## 2019-12-04 DIAGNOSIS — N186 End stage renal disease: Secondary | ICD-10-CM

## 2019-12-04 DIAGNOSIS — Z9889 Other specified postprocedural states: Secondary | ICD-10-CM

## 2019-12-04 DIAGNOSIS — I10 Essential (primary) hypertension: Secondary | ICD-10-CM

## 2019-12-04 DIAGNOSIS — E785 Hyperlipidemia, unspecified: Secondary | ICD-10-CM

## 2019-12-04 DIAGNOSIS — E1169 Type 2 diabetes mellitus with other specified complication: Secondary | ICD-10-CM

## 2019-12-06 DIAGNOSIS — N186 End stage renal disease: Secondary | ICD-10-CM | POA: Diagnosis not present

## 2019-12-06 DIAGNOSIS — Z992 Dependence on renal dialysis: Secondary | ICD-10-CM | POA: Diagnosis not present

## 2019-12-06 DIAGNOSIS — Z23 Encounter for immunization: Secondary | ICD-10-CM | POA: Diagnosis not present

## 2019-12-09 ENCOUNTER — Encounter (INDEPENDENT_AMBULATORY_CARE_PROVIDER_SITE_OTHER): Payer: Self-pay | Admitting: Nurse Practitioner

## 2019-12-09 DIAGNOSIS — Z992 Dependence on renal dialysis: Secondary | ICD-10-CM | POA: Diagnosis not present

## 2019-12-09 DIAGNOSIS — N2581 Secondary hyperparathyroidism of renal origin: Secondary | ICD-10-CM | POA: Diagnosis not present

## 2019-12-09 DIAGNOSIS — Z23 Encounter for immunization: Secondary | ICD-10-CM | POA: Diagnosis not present

## 2019-12-09 DIAGNOSIS — N186 End stage renal disease: Secondary | ICD-10-CM | POA: Diagnosis not present

## 2019-12-09 NOTE — Progress Notes (Signed)
Subjective:    Patient ID: Lori Stanley, female    DOB: [birth year], 86 y.o.   MRN: [id number] Chief Complaint  Patient presents with  . Follow-up    [facility] 2wk post procedure    Patient presents today for follow-up after left brachial axillary AV graft placement on 11/13/2019. The patient notes swelling of her upper extremity since the placement. She denies any fever, chills, nausea, vomiting or diarrhea. The patient also notes having some coldness in her hand as well as numbness and tingling. Currently the patient is using her PermCath and she denies it getting worse during dialysis.  The patient's incision sites are clean dry and intact with no evidence of infection.  Today the patient has a flow volume of 1755.  There is noted tapering to the inflow artery with the 0.24 lm in the arterial anastomosis.  The left radial artery velocities do not suggest significant steal.   Review of Systems  Cardiovascular:       LUE swelling  Skin:       Cold hand  All other systems reviewed and are negative.      Objective:   Physical Exam Vitals reviewed.  HENT:     Head: Normocephalic.  Cardiovascular:     Rate and Rhythm: Normal rate and regular rhythm.     Pulses:          Radial pulses are 1+ on the left side.     Arteriovenous access: left arteriovenous access is present.    Comments: Good thrill and bruit Pulmonary:     Effort: Pulmonary effort is normal.  Neurological:     Mental Status: She is alert and oriented to person, place, and time.  Psychiatric:        Mood and Affect: Mood normal.        Behavior: Behavior normal.        Thought Content: Thought content normal.        Judgment: Judgment normal.     BP (!) 167/67 (BP Location: Right Arm)   Pulse 73   Resp 16   Wt 118 lb (53.5 kg)   BMI 20.90 kg/m   Past Medical History:  Diagnosis Date  . Acute heart failure ([place])   . Anemia   . Anxiety   . CHF (congestive heart failure) ([place])   . Chronic kidney  disease    stage V; end stage renal disease  . Chronic low back pain   . Depression   . Diabetes mellitus without complication ([place])   . GERD (gastroesophageal reflux disease)   . Hyperlipidemia   . Hypertension   . MI (myocardial infarction) ([place])   . Obesity     Social History   Socioeconomic History  . Marital status: Widowed    Spouse name: Not on file  . Number of children: 1  . Years of education: Not on file  . Highest education level: 11th grade  Occupational History  . Occupation: retired  Tobacco Use  . Smoking status: Former Smoker    Types: Cigarettes  . Smokeless tobacco: Never Used  . Tobacco comment: Quit in 2015-2016  Vaping Use  . Vaping Use: Never used  Substance and Sexual Activity  . Alcohol use: No  . Drug use: No  . Sexual activity: Never  Other Topics Concern  . Not on file  Social History Narrative   Lives in [place]; self; friends do chores; quit smoking many years; no alcohol. Worked in the  mills.    Social Determinants of Health   Financial Resource Strain: Low Risk   . Difficulty of Paying Living Expenses: Not hard at all  Food Insecurity: No Food Insecurity  . Worried About Charity fundraiser in the Last Year: Never true  . Ran Out of Food in the Last Year: Never true  Transportation Needs: No Transportation Needs  . Lack of Transportation (Medical): No  . Lack of Transportation (Non-Medical): No  Physical Activity: Inactive  . Days of Exercise per Week: 0 days  . Minutes of Exercise per Session: 0 min  Stress: No Stress Concern Present  . Feeling of Stress : Only a little  Social Connections: Moderately Isolated  . Frequency of Communication with Friends and Family: More than three times a week  . Frequency of Social Gatherings with Friends and Family: Once a week  . Attends Religious Services: More than 4 times per year  . Active Member of Clubs or Organizations: No  . Attends Archivist Meetings: Never  . Marital  Status: Widowed  Intimate Partner Violence: Not At Risk  . Fear of Current or Ex-Partner: No  . Emotionally Abused: No  . Physically Abused: No  . Sexually Abused: No    Past Surgical History:  Procedure Laterality Date  . ABDOMINAL HYSTERECTOMY    . APPENDECTOMY    . AV FISTULA PLACEMENT Left 11/13/2019   Procedure: INSERTION OF ARTERIOVENOUS (AV) GORE-TEX GRAFT ARM ( BRACHIAL AXILLARY );  Surgeon: Katha Cabal, MD;  Location: [facility] ORS;  Service: Vascular;  Laterality: Left;  . DIALYSIS/PERMA CATHETER INSERTION N/A 06/07/2019   Procedure: DIALYSIS/PERMA CATHETER INSERTION;  Surgeon: Katha Cabal, MD;  Location: [place] [facility];  Service: Cardiovascular;  Laterality: N/A;  . EYE SURGERY    . HERNIA REPAIR    . INCISION AND DRAINAGE PERIRECTAL ABSCESS    . KNEE SURGERY    . THYROID SURGERY    . VAGINAL HYSTERECTOMY      Family History  Problem Relation Age of Onset  . Heart attack Mother   . Heart disease Sister   . Cancer Sister     Allergies  Allergen Reactions  . Antihistamines, Chlorpheniramine-Type Other (See Comments)    Unknown reaction.  [name] Paxil [Paroxetine]     "makes her feel funny" not in a good way  . Codeine Rash and Other (See Comments)    Mouth sore       Assessment & Plan:   1. ESRD (end stage renal disease) ([place]) Patient currently has significant edema in her graft site, therefore they would not be able to utilize her fistula just at plus it has not had time to fully heal and mature.  We will have the patient return to the office in 4 weeks to reevaluate the swelling as well as her pain and discomfort.  2. Essential hypertension Continue antihypertensive medications as already ordered, these medications have been reviewed and there are no changes at this time.   3. Type 2 diabetes mellitus with hyperlipidemia ([place]) Continue hypoglycemic medications as already ordered, these medications have been reviewed and there are no changes at  this time.  Hgb A1C to be monitored as already arranged by primary service    Current Outpatient Medications on File Prior to Visit  Medication Sig Dispense Refill  . acetaminophen (TYLENOL) 500 MG tablet Take 500-1,000 mg by mouth every 6 (six) hours as needed for mild pain or fever.     [name]  albuterol (VENTOLIN HFA) 108 (90 Base) MCG/ACT inhaler TAKE 2 PUFFS BY MOUTH EVERY 6 HOURS AS NEEDED FOR WHEEZE OR SHORTNESS OF BREATH 8 g 1  . amLODipine (NORVASC) 5 MG tablet TAKE 1 TABLET BY MOUTH EVERY DAY 90 tablet 1  . aspirin 81 MG chewable tablet Chew 81 mg by mouth daily.    . Bepotastine Besilate (BEPREVE) 1.5 % SOLN Place 1 drop into both eyes in the morning and at bedtime.     . brimonidine (ALPHAGAN) 0.2 % ophthalmic solution Place 1 drop into both eyes 2 (two) times daily.     . calcitRIOL (ROCALTROL) 0.25 MCG capsule Take 0.25 mcg by mouth every Monday, Wednesday, and Friday.     . carvedilol (COREG) 12.5 MG tablet TAKE 2 TABLETS (25 MG TOTAL) BY MOUTH 2 (TWO) TIMES DAILY. (Patient taking differently: Take 12.5 mg by mouth 2 (two) times daily. ) 360 tablet 3  . dorzolamide-timolol (COSOPT) 22.3-6.8 MG/ML ophthalmic solution Place 1 drop into both eyes 2 (two) times daily.     [name] latanoprost (XALATAN) 0.005 % ophthalmic solution Place 1 drop into both eyes at bedtime.     [name] loratadine (CLARITIN) 10 MG tablet Take 1 tablet (10 mg total) by mouth daily. (Patient taking differently: Take 10 mg by mouth daily as needed for allergies. ) 90 tablet 3  . multivitamin (RENA-VIT) TABS tablet Take 1 tablet by mouth daily.     [name] oxyCODONE-acetaminophen (PERCOCET) 5-325 MG tablet Take 1-2 tablets by mouth every 6 (six) hours as needed for moderate pain or severe pain. 20 tablet 0  . OXYGEN Inhale 2 L into the lungs at bedtime.    . sertraline (ZOLOFT) 25 MG tablet Take 25 mg by mouth daily.    [name] telmisartan (MICARDIS) 40 MG tablet Take 40 mg by mouth daily.    [name] torsemide (DEMADEX) 100 MG tablet Take 100  mg by mouth daily.    . traZODone (DESYREL) 150 MG tablet Take 150 mg by mouth at bedtime.    . Vitamin D, Ergocalciferol, (DRISDOL) 50000 units CAPS capsule Take 50,000 Units by mouth every Sunday.      No current facility-administered medications on file prior to visit.    There are no Patient Instructions on file for this visit. No follow-ups on file.   Kris Hartmann, NP

## 2019-12-13 DIAGNOSIS — N186 End stage renal disease: Secondary | ICD-10-CM | POA: Diagnosis not present

## 2019-12-13 DIAGNOSIS — Z992 Dependence on renal dialysis: Secondary | ICD-10-CM | POA: Diagnosis not present

## 2019-12-13 DIAGNOSIS — Z23 Encounter for immunization: Secondary | ICD-10-CM | POA: Diagnosis not present

## 2019-12-14 DIAGNOSIS — R062 Wheezing: Secondary | ICD-10-CM | POA: Diagnosis not present

## 2019-12-14 DIAGNOSIS — I509 Heart failure, unspecified: Secondary | ICD-10-CM | POA: Diagnosis not present

## 2019-12-14 DIAGNOSIS — J449 Chronic obstructive pulmonary disease, unspecified: Secondary | ICD-10-CM | POA: Diagnosis not present

## 2019-12-16 DIAGNOSIS — I5022 Chronic systolic (congestive) heart failure: Secondary | ICD-10-CM | POA: Diagnosis not present

## 2019-12-16 DIAGNOSIS — Z992 Dependence on renal dialysis: Secondary | ICD-10-CM | POA: Diagnosis not present

## 2019-12-16 DIAGNOSIS — Z23 Encounter for immunization: Secondary | ICD-10-CM | POA: Diagnosis not present

## 2019-12-16 DIAGNOSIS — N186 End stage renal disease: Secondary | ICD-10-CM | POA: Diagnosis not present

## 2019-12-20 DIAGNOSIS — Z23 Encounter for immunization: Secondary | ICD-10-CM | POA: Diagnosis not present

## 2019-12-20 DIAGNOSIS — N186 End stage renal disease: Secondary | ICD-10-CM | POA: Diagnosis not present

## 2019-12-20 DIAGNOSIS — Z992 Dependence on renal dialysis: Secondary | ICD-10-CM | POA: Diagnosis not present

## 2019-12-23 DIAGNOSIS — Z992 Dependence on renal dialysis: Secondary | ICD-10-CM | POA: Diagnosis not present

## 2019-12-23 DIAGNOSIS — Z23 Encounter for immunization: Secondary | ICD-10-CM | POA: Diagnosis not present

## 2019-12-23 DIAGNOSIS — N186 End stage renal disease: Secondary | ICD-10-CM | POA: Diagnosis not present

## 2019-12-26 ENCOUNTER — Ambulatory Visit (INDEPENDENT_AMBULATORY_CARE_PROVIDER_SITE_OTHER): Payer: Medicare Other

## 2019-12-26 DIAGNOSIS — N186 End stage renal disease: Secondary | ICD-10-CM

## 2019-12-26 DIAGNOSIS — I5032 Chronic diastolic (congestive) heart failure: Secondary | ICD-10-CM

## 2019-12-26 DIAGNOSIS — I10 Essential (primary) hypertension: Secondary | ICD-10-CM

## 2019-12-27 DIAGNOSIS — Z23 Encounter for immunization: Secondary | ICD-10-CM | POA: Diagnosis not present

## 2019-12-27 DIAGNOSIS — Z992 Dependence on renal dialysis: Secondary | ICD-10-CM | POA: Diagnosis not present

## 2019-12-27 DIAGNOSIS — N186 End stage renal disease: Secondary | ICD-10-CM | POA: Diagnosis not present

## 2019-12-27 NOTE — Progress Notes (Signed)
[name],acting as a scribe for [name], MD.,have documented all relevant documentation on the behalf of [name], MD,as directed by  [name], MD while in the presence of [name], MD.  Established patient visit   Patient: Lori Stanley   DOB: 04/20/1932   86 y.o. Female  MRN: [id number] Visit Date: 12/31/2019  Today's healthcare provider: [name], MD   Chief Complaint  Patient presents with  . Diabetes  . Hypertension   Subjective    HPI  Patient comes in today for follow-up.  She is doing okay but feeling progressively weaker.  She is on hemodialysis Mondays and Fridays.  She wears nocturnal oxygen.  She has no chest pain or shortness of breath or any neurologic symptoms.  She is just getting weak. Diabetes Mellitus Type II, follow-up  Lab Results  Component Value Date   HGBA1C 6.0 (A) 07/23/2019   HGBA1C 6.0 (H) 04/10/2019   HGBA1C 6.4 (A) 03/11/2019   Last seen for diabetes 6 months ago.  Management since then includes; Last 2 A1c's have been 6.0.  This time will stop her insulin and follow-up A1c in 3 months. She reports excellent compliance with treatment. She is not having side effects.   Home blood sugar records: fasting range: 118  Episodes of hypoglycemia? Yes feels weak and nervous.    Current insulin regiment: none Most Recent Eye Exam: overdue   --------------------------------------------------------------------------------------------------- Hypertension, follow-up  BP Readings from Last 3 Encounters:  12/31/19 (!) 108/33  12/04/19 (!) 167/67  11/13/19 (!) 153/75   Wt Readings from Last 3 Encounters:  12/31/19 119 lb 3.2 oz (54.1 kg)  12/04/19 118 lb (53.5 kg)  11/07/19 123 lb (55.8 kg)     She was last seen for hypertension 2 months ago.  BP at that visit was 152/60. Management since that visit includes; Fair control. She reports excellent compliance with treatment. She is  not having side effects.  She is exercising. She is adherent to low salt diet.   Outside blood pressures are not checking.  She does not smoke.  Use of agents associated with hypertension: none.   ---------------------------------------------------------------------------------------------------  Chronic kidney disease (CKD), stage V ([place]) From 10/29/2019-On dialysis Monday and Friday.  Reactive depression From 10/29/2019.We will see how she is doing on next appointment in 2 months.  Social History   Tobacco Use  . Smoking status: Former Smoker    Types: Cigarettes  . Smokeless tobacco: Never Used  . Tobacco comment: Quit in 2015-2016  Vaping Use  . Vaping Use: Never used  Substance Use Topics  . Alcohol use: No  . Drug use: No       Medications: Outpatient Medications Prior to Visit  Medication Sig  . acetaminophen (TYLENOL) 500 MG tablet Take 500-1,000 mg by mouth every 6 (six) hours as needed for mild pain or fever.   [name] albuterol (VENTOLIN HFA) 108 (90 Base) MCG/ACT inhaler TAKE 2 PUFFS BY MOUTH EVERY 6 HOURS AS NEEDED FOR WHEEZE OR SHORTNESS OF BREATH  . amLODipine (NORVASC) 5 MG tablet TAKE 1 TABLET BY MOUTH EVERY DAY  . aspirin 81 MG chewable tablet Chew 81 mg by mouth daily.  . Bepotastine Besilate (BEPREVE) 1.5 % SOLN Place 1 drop into both eyes in the morning and at bedtime.   . brimonidine (ALPHAGAN) 0.2 % ophthalmic solution Place 1 drop into both eyes 2 (two) times daily.   . calcitRIOL (ROCALTROL) 0.25 MCG capsule  Take 0.25 mcg by mouth every Monday, Wednesday, and Friday.   . carvedilol (COREG) 12.5 MG tablet TAKE 2 TABLETS (25 MG TOTAL) BY MOUTH 2 (TWO) TIMES DAILY. (Patient taking differently: Take 12.5 mg by mouth 2 (two) times daily. )  . dorzolamide-timolol (COSOPT) 22.3-6.8 MG/ML ophthalmic solution Place 1 drop into both eyes 2 (two) times daily.   [name] latanoprost (XALATAN) 0.005 % ophthalmic solution Place 1 drop into both eyes at bedtime.   [name]  loratadine (CLARITIN) 10 MG tablet Take 1 tablet (10 mg total) by mouth daily. (Patient taking differently: Take 10 mg by mouth daily as needed for allergies. )  . multivitamin (RENA-VIT) TABS tablet Take 1 tablet by mouth daily.   [name] oxyCODONE-acetaminophen (PERCOCET) 5-325 MG tablet Take 1-2 tablets by mouth every 6 (six) hours as needed for moderate pain or severe pain.  . OXYGEN Inhale 2 L into the lungs at bedtime.  . sertraline (ZOLOFT) 25 MG tablet Take 25 mg by mouth daily.  [name] telmisartan (MICARDIS) 40 MG tablet Take 40 mg by mouth daily.  [name] torsemide (DEMADEX) 100 MG tablet Take 100 mg by mouth daily.  . traZODone (DESYREL) 150 MG tablet Take 150 mg by mouth at bedtime.  . Vitamin D, Ergocalciferol, (DRISDOL) 50000 units CAPS capsule Take 50,000 Units by mouth every Sunday.    No facility-administered medications prior to visit.    Review of Systems  Constitutional: Negative for appetite change, chills, fatigue and fever.  Respiratory: Negative for chest tightness and shortness of breath.   Cardiovascular: Negative for chest pain and palpitations.  Gastrointestinal: Negative for abdominal pain, nausea and vomiting.  Neurological: Negative for dizziness and weakness.    Last hemoglobin A1c Lab Results  Component Value Date   HGBA1C 6.2 (A) 12/31/2019      Objective    BP (!) 108/33 (BP Location: Right Arm, Patient Position: Sitting, Cuff Size: Normal)   Pulse 65   Temp 98.2 F (36.8 C) (Oral)   Ht 5\' 3"  (1.6 m)   Wt 119 lb 3.2 oz (54.1 kg)   BMI 21.12 kg/m  BP Readings from Last 3 Encounters:  12/31/19 (!) 108/33  12/04/19 (!) 167/67  11/13/19 (!) 153/75   Wt Readings from Last 3 Encounters:  12/31/19 119 lb 3.2 oz (54.1 kg)  12/04/19 118 lb (53.5 kg)  11/07/19 123 lb (55.8 kg)      Physical Exam Vitals reviewed.  HENT:     Head: Normocephalic and atraumatic.     Right Ear: External ear normal.     Left Ear: External ear normal.  Eyes:     General: No  scleral icterus.    Conjunctiva/sclera: Conjunctivae normal.  Cardiovascular:     Rate and Rhythm: Normal rate and regular rhythm.     Heart sounds: Normal heart sounds. No friction rub. No gallop.   Pulmonary:     Effort: No respiratory distress.     Breath sounds: Normal breath sounds.  Abdominal:     Palpations: Abdomen is soft.  Musculoskeletal:     Right lower leg: Edema present.     Left lower leg: Edema present.     Comments: Chronic 1+ LE edema.  Skin:    General: Skin is warm and dry.  Neurological:     General: No focal deficit present.     Mental Status: She is alert and oriented to person, place, and time.  Psychiatric:        Mood and Affect: Mood  normal.        Behavior: Behavior normal.        Thought Content: Thought content normal.        Judgment: Judgment normal.       No results found for any visits on 12/31/19.  Assessment & Plan     1. Type 2 diabetes mellitus with hyperlipidemia (HCC) A1c is 6.2 today. - POCT HgB A1C  2. Essential hypertension Blood pressure actually low and recheck blood pressure is 98/34.  May need to back off on her telmisartan dose she is asymptomatic for any hypotension  3. Acute on chronic heart failure with preserved ejection fraction (HFpEF) (HCC) Clinically stable  4. Reactive depression Lifelong sertraline  5. Allergic rhinitis, unspecified seasonality, unspecified trigger   6. Gastro-esophageal reflux disease without esophagitis   7. Secondary hyperparathyroidism of renal origin ([place])   8. ESRD (end stage renal disease) ([place]) On hemodialysis Mondays and Fridays  9. Anemia of chronic disease Follow-up in 3 to 4 months.   No follow-ups on file.         Psalms Olarte Cranford Mon, MD  [facility] [phone] (phone) [phone] (fax)  [place]

## 2019-12-30 DIAGNOSIS — N186 End stage renal disease: Secondary | ICD-10-CM | POA: Diagnosis not present

## 2019-12-30 DIAGNOSIS — Z23 Encounter for immunization: Secondary | ICD-10-CM | POA: Diagnosis not present

## 2019-12-30 DIAGNOSIS — Z992 Dependence on renal dialysis: Secondary | ICD-10-CM | POA: Diagnosis not present

## 2019-12-31 ENCOUNTER — Encounter: Payer: Self-pay | Admitting: Family Medicine

## 2019-12-31 ENCOUNTER — Ambulatory Visit (INDEPENDENT_AMBULATORY_CARE_PROVIDER_SITE_OTHER): Payer: Medicare Other | Admitting: Family Medicine

## 2019-12-31 ENCOUNTER — Other Ambulatory Visit: Payer: Self-pay

## 2019-12-31 VITALS — BP 108/33 | HR 65 | Temp 98.2°F | Ht 63.0 in | Wt 119.2 lb

## 2019-12-31 DIAGNOSIS — J309 Allergic rhinitis, unspecified: Secondary | ICD-10-CM

## 2019-12-31 DIAGNOSIS — I5033 Acute on chronic diastolic (congestive) heart failure: Secondary | ICD-10-CM

## 2019-12-31 DIAGNOSIS — N2581 Secondary hyperparathyroidism of renal origin: Secondary | ICD-10-CM

## 2019-12-31 DIAGNOSIS — F329 Major depressive disorder, single episode, unspecified: Secondary | ICD-10-CM | POA: Diagnosis not present

## 2019-12-31 DIAGNOSIS — E1169 Type 2 diabetes mellitus with other specified complication: Secondary | ICD-10-CM

## 2019-12-31 DIAGNOSIS — N186 End stage renal disease: Secondary | ICD-10-CM | POA: Diagnosis not present

## 2019-12-31 DIAGNOSIS — E785 Hyperlipidemia, unspecified: Secondary | ICD-10-CM

## 2019-12-31 DIAGNOSIS — K219 Gastro-esophageal reflux disease without esophagitis: Secondary | ICD-10-CM

## 2019-12-31 DIAGNOSIS — D638 Anemia in other chronic diseases classified elsewhere: Secondary | ICD-10-CM

## 2019-12-31 DIAGNOSIS — I10 Essential (primary) hypertension: Secondary | ICD-10-CM

## 2019-12-31 DIAGNOSIS — Z992 Dependence on renal dialysis: Secondary | ICD-10-CM | POA: Diagnosis not present

## 2019-12-31 LAB — POCT GLYCOSYLATED HEMOGLOBIN (HGB A1C)
Estimated Average Glucose: 131
Hemoglobin A1C: 6.2 % — AB (ref 4.0–5.6)

## 2020-01-01 ENCOUNTER — Other Ambulatory Visit (INDEPENDENT_AMBULATORY_CARE_PROVIDER_SITE_OTHER): Payer: Self-pay | Admitting: Nurse Practitioner

## 2020-01-01 DIAGNOSIS — N186 End stage renal disease: Secondary | ICD-10-CM

## 2020-01-01 DIAGNOSIS — M7989 Other specified soft tissue disorders: Secondary | ICD-10-CM

## 2020-01-02 ENCOUNTER — Other Ambulatory Visit: Payer: Self-pay

## 2020-01-02 ENCOUNTER — Ambulatory Visit (INDEPENDENT_AMBULATORY_CARE_PROVIDER_SITE_OTHER): Payer: Medicare Other | Admitting: Vascular Surgery

## 2020-01-02 ENCOUNTER — Ambulatory Visit (INDEPENDENT_AMBULATORY_CARE_PROVIDER_SITE_OTHER): Payer: Medicare Other

## 2020-01-02 ENCOUNTER — Encounter (INDEPENDENT_AMBULATORY_CARE_PROVIDER_SITE_OTHER): Payer: Self-pay | Admitting: Vascular Surgery

## 2020-01-02 VITALS — BP 127/47 | HR 67 | Resp 16 | Ht 63.0 in | Wt 122.0 lb

## 2020-01-02 DIAGNOSIS — M7989 Other specified soft tissue disorders: Secondary | ICD-10-CM | POA: Diagnosis not present

## 2020-01-02 DIAGNOSIS — N186 End stage renal disease: Secondary | ICD-10-CM

## 2020-01-02 NOTE — Progress Notes (Signed)
Patient ID: Lori Stanley, female   DOB: [birth year], 86 y.o.   MRN: [id number]  Chief Complaint  Patient presents with  . Follow-up    ultrasound    HPI Lori Stanley is a 86 y.o. female.   Patient is s/p left brachial axillary AV graft on 11/13/2019 She denies pain at the site  Past Medical History:  Diagnosis Date  . Acute heart failure ([place])   . Anemia   . Anxiety   . CHF (congestive heart failure) ([place])   . Chronic kidney disease    stage V; end stage renal disease  . Chronic low back pain   . Depression   . Diabetes mellitus without complication ([place])   . GERD (gastroesophageal reflux disease)   . Hyperlipidemia   . Hypertension   . MI (myocardial infarction) ([place])   . Obesity     Past Surgical History:  Procedure Laterality Date  . ABDOMINAL HYSTERECTOMY    . APPENDECTOMY    . AV FISTULA PLACEMENT Left 11/13/2019   Procedure: INSERTION OF ARTERIOVENOUS (AV) GORE-TEX GRAFT ARM ( BRACHIAL AXILLARY );  Surgeon: Katha Cabal, MD;  Location: [facility] ORS;  Service: Vascular;  Laterality: Left;  . DIALYSIS/PERMA CATHETER INSERTION N/A 06/07/2019   Procedure: DIALYSIS/PERMA CATHETER INSERTION;  Surgeon: Katha Cabal, MD;  Location: [facility];  Service: Cardiovascular;  Laterality: N/A;  . EYE SURGERY    . HERNIA REPAIR    . INCISION AND DRAINAGE PERIRECTAL ABSCESS    . KNEE SURGERY    . THYROID SURGERY    . VAGINAL HYSTERECTOMY        Allergies  Allergen Reactions  . Antihistamines, Chlorpheniramine-Type Other (See Comments)    Unknown reaction.  [name] Paxil [Paroxetine]     "makes her feel funny" not in a good way  . Codeine Rash and Other (See Comments)    Mouth sore    Current Outpatient Medications  Medication Sig Dispense Refill  . acetaminophen (TYLENOL) 500 MG tablet Take 500-1,000 mg by mouth every 6 (six) hours as needed for mild pain or fever.     [name] albuterol (VENTOLIN HFA) 108 (90 Base) MCG/ACT inhaler TAKE 2 PUFFS BY  MOUTH EVERY 6 HOURS AS NEEDED FOR WHEEZE OR SHORTNESS OF BREATH 8 g 1  . amLODipine (NORVASC) 5 MG tablet TAKE 1 TABLET BY MOUTH EVERY DAY 90 tablet 1  . aspirin 81 MG chewable tablet Chew 81 mg by mouth daily.    . Bepotastine Besilate (BEPREVE) 1.5 % SOLN Place 1 drop into both eyes in the morning and at bedtime.     . brimonidine (ALPHAGAN) 0.2 % ophthalmic solution Place 1 drop into both eyes 2 (two) times daily.     . calcitRIOL (ROCALTROL) 0.25 MCG capsule Take 0.25 mcg by mouth every Monday, Wednesday, and Friday.     . carvedilol (COREG) 12.5 MG tablet TAKE 2 TABLETS (25 MG TOTAL) BY MOUTH 2 (TWO) TIMES DAILY. (Patient taking differently: Take 12.5 mg by mouth 2 (two) times daily. ) 360 tablet 3  . dorzolamide-timolol (COSOPT) 22.3-6.8 MG/ML ophthalmic solution Place 1 drop into both eyes 2 (two) times daily.     [name] latanoprost (XALATAN) 0.005 % ophthalmic solution Place 1 drop into both eyes at bedtime.     [name] loratadine (CLARITIN) 10 MG tablet Take 1 tablet (10 mg total) by mouth daily. (Patient taking differently: Take 10 mg by mouth daily as needed for allergies. ) 90 tablet  3  . multivitamin (RENA-VIT) TABS tablet Take 1 tablet by mouth daily.     [name] oxyCODONE-acetaminophen (PERCOCET) 5-325 MG tablet Take 1-2 tablets by mouth every 6 (six) hours as needed for moderate pain or severe pain. 20 tablet 0  . OXYGEN Inhale 2 L into the lungs at bedtime.    . sertraline (ZOLOFT) 25 MG tablet Take 25 mg by mouth daily.    [name] telmisartan (MICARDIS) 40 MG tablet Take 40 mg by mouth daily.    [name] torsemide (DEMADEX) 100 MG tablet Take 100 mg by mouth daily.    . traZODone (DESYREL) 150 MG tablet Take 150 mg by mouth at bedtime.    . Vitamin D, Ergocalciferol, (DRISDOL) 50000 units CAPS capsule Take 50,000 Units by mouth every Sunday.      No current facility-administered medications for this visit.        Physical Exam BP (!) 127/47 (BP Location: Right Arm)   Pulse 67   Resp 16   Ht 5\' 3"   (1.6 m)   Wt 122 lb (55.3 kg)   BMI 21.61 kg/m  Gen:  WD/WN, NAD Skin: incision C/D/I;  Left arm Good thrill good bruit     Assessment/Plan: 1. ESRD (end stage renal disease) ([place]) Recommend:  OK to begin cannulation  The patient is doing well and currently has adequate dialysis access. The patient's dialysis center is not reporting any access issues. Flow pattern is markedly decreased when compared to the prior ultrasound.  The patient should have a fistulogram to ensure the access is suitable for cannulation.  Risk and benefits were reviewed the patient.  Indications for the procedure were reviewed.  All questions were answered, the patient agrees to proceed.   The patient will follow-up with me in the office after each ultrasound   - VAS [place] (AVF, AVG); Future      Lori Stanley 01/02/2020, 11:18 AM   This note was created with Dragon medical transcription system.  Any errors from dictation are unintentional.

## 2020-01-02 NOTE — H&P (View-Only) (Signed)
Patient ID: Lori Stanley, female   DOB: Nov 22, 1931, 86 y.o.   MRN: [id number]  Chief Complaint  Patient presents with  . Follow-up    ultrasound    HPI Lori Stanley is a 86 y.o. female.   Patient is s/p left brachial axillary AV graft on 11/13/2019 She denies pain at the site  Past Medical History:  Diagnosis Date  . Acute heart failure ([place])   . Anemia   . Anxiety   . CHF (congestive heart failure) ([place])   . Chronic kidney disease    stage V; end stage renal disease  . Chronic low back pain   . Depression   . Diabetes mellitus without complication ([place])   . GERD (gastroesophageal reflux disease)   . Hyperlipidemia   . Hypertension   . MI (myocardial infarction) ([place])   . Obesity     Past Surgical History:  Procedure Laterality Date  . ABDOMINAL HYSTERECTOMY    . APPENDECTOMY    . AV FISTULA PLACEMENT Left 11/13/2019   Procedure: INSERTION OF ARTERIOVENOUS (AV) GORE-TEX GRAFT ARM ( BRACHIAL AXILLARY );  Surgeon: Katha Cabal, MD;  Location: [facility] ORS;  Service: Vascular;  Laterality: Left;  . DIALYSIS/PERMA CATHETER INSERTION N/A 06/07/2019   Procedure: DIALYSIS/PERMA CATHETER INSERTION;  Surgeon: Katha Cabal, MD;  Location: [facility];  Service: Cardiovascular;  Laterality: N/A;  . EYE SURGERY    . HERNIA REPAIR    . INCISION AND DRAINAGE PERIRECTAL ABSCESS    . KNEE SURGERY    . THYROID SURGERY    . VAGINAL HYSTERECTOMY        Allergies  Allergen Reactions  . Antihistamines, Chlorpheniramine-Type Other (See Comments)    Unknown reaction.  [name] Paxil [Paroxetine]     "makes her feel funny" not in a good way  . Codeine Rash and Other (See Comments)    Mouth sore    Current Outpatient Medications  Medication Sig Dispense Refill  . acetaminophen (TYLENOL) 500 MG tablet Take 500-1,000 mg by mouth every 6 (six) hours as needed for mild pain or fever.     [name] albuterol (VENTOLIN HFA) 108 (90 Base) MCG/ACT inhaler TAKE 2 PUFFS BY  MOUTH EVERY 6 HOURS AS NEEDED FOR WHEEZE OR SHORTNESS OF BREATH 8 g 1  . amLODipine (NORVASC) 5 MG tablet TAKE 1 TABLET BY MOUTH EVERY DAY 90 tablet 1  . aspirin 81 MG chewable tablet Chew 81 mg by mouth daily.    . Bepotastine Besilate (BEPREVE) 1.5 % SOLN Place 1 drop into both eyes in the morning and at bedtime.     . brimonidine (ALPHAGAN) 0.2 % ophthalmic solution Place 1 drop into both eyes 2 (two) times daily.     . calcitRIOL (ROCALTROL) 0.25 MCG capsule Take 0.25 mcg by mouth every Monday, Wednesday, and Friday.     . carvedilol (COREG) 12.5 MG tablet TAKE 2 TABLETS (25 MG TOTAL) BY MOUTH 2 (TWO) TIMES DAILY. (Patient taking differently: Take 12.5 mg by mouth 2 (two) times daily. ) 360 tablet 3  . dorzolamide-timolol (COSOPT) 22.3-6.8 MG/ML ophthalmic solution Place 1 drop into both eyes 2 (two) times daily.     [name] latanoprost (XALATAN) 0.005 % ophthalmic solution Place 1 drop into both eyes at bedtime.     [name] loratadine (CLARITIN) 10 MG tablet Take 1 tablet (10 mg total) by mouth daily. (Patient taking differently: Take 10 mg by mouth daily as needed for allergies. ) 90 tablet  3  . multivitamin (RENA-VIT) TABS tablet Take 1 tablet by mouth daily.     [name] oxyCODONE-acetaminophen (PERCOCET) 5-325 MG tablet Take 1-2 tablets by mouth every 6 (six) hours as needed for moderate pain or severe pain. 20 tablet 0  . OXYGEN Inhale 2 L into the lungs at bedtime.    . sertraline (ZOLOFT) 25 MG tablet Take 25 mg by mouth daily.    [name] telmisartan (MICARDIS) 40 MG tablet Take 40 mg by mouth daily.    [name] torsemide (DEMADEX) 100 MG tablet Take 100 mg by mouth daily.    . traZODone (DESYREL) 150 MG tablet Take 150 mg by mouth at bedtime.    . Vitamin D, Ergocalciferol, (DRISDOL) 50000 units CAPS capsule Take 50,000 Units by mouth every Sunday.      No current facility-administered medications for this visit.        Physical Exam BP (!) 127/47 (BP Location: Right Arm)   Pulse 67   Resp 16   Ht 5\' 3"   (1.6 m)   Wt 122 lb (55.3 kg)   BMI 21.61 kg/m  Gen:  WD/WN, NAD Skin: incision C/D/I;  Left arm Good thrill good bruit     Assessment/Plan: 1. ESRD (end stage renal disease) ([place]) Recommend:  OK to begin cannulation  The patient is doing well and currently has adequate dialysis access. The patient's dialysis center is not reporting any access issues. Flow pattern is markedly decreased when compared to the prior ultrasound.  The patient should have a fistulogram to ensure the access is suitable for cannulation.  Risk and benefits were reviewed the patient.  Indications for the procedure were reviewed.  All questions were answered, the patient agrees to proceed.   The patient will follow-up with me in the office after each ultrasound   - VAS [place] (AVF, AVG); Future      [name] 01/02/2020, 11:18 AM   This note was created with Dragon medical transcription system.  Any errors from dictation are unintentional.

## 2020-01-03 DIAGNOSIS — Z992 Dependence on renal dialysis: Secondary | ICD-10-CM | POA: Diagnosis not present

## 2020-01-03 DIAGNOSIS — N186 End stage renal disease: Secondary | ICD-10-CM | POA: Diagnosis not present

## 2020-01-06 DIAGNOSIS — Z992 Dependence on renal dialysis: Secondary | ICD-10-CM | POA: Diagnosis not present

## 2020-01-06 DIAGNOSIS — N186 End stage renal disease: Secondary | ICD-10-CM | POA: Diagnosis not present

## 2020-01-07 ENCOUNTER — Telehealth (INDEPENDENT_AMBULATORY_CARE_PROVIDER_SITE_OTHER): Payer: Self-pay

## 2020-01-07 NOTE — Chronic Care Management (AMB) (Signed)
Chronic Care Management   Follow Up Note    Name: Lori Stanley MRN: [id number] DOB: 1931/11/11  Primary Care Provider: Jerrol Banana., MD Reason for referral : Chronic Care Management   Lori Stanley is a 86 y.o. year old female who is a primary care patient of Lori Banana., MD. She is currently engaged with the care management team.   Review of Lori Stanley's status, including review of consultants reports, relevant labs and test results was conducted today. Collaboration with appropriate care team members was performed as part of the comprehensive evaluation and provision of chronic care management services.    SDOH (Social Determinants of Health) assessments performed: No    Outpatient Encounter Medications as of 12/26/2019  Medication Sig   acetaminophen (TYLENOL) 500 MG tablet Take 500-1,000 mg by mouth every 6 (six) hours as needed for mild pain or fever.    albuterol (VENTOLIN HFA) 108 (90 Base) MCG/ACT inhaler TAKE 2 PUFFS BY MOUTH EVERY 6 HOURS AS NEEDED FOR WHEEZE OR SHORTNESS OF BREATH   amLODipine (NORVASC) 5 MG tablet TAKE 1 TABLET BY MOUTH EVERY DAY   aspirin 81 MG chewable tablet Chew 81 mg by mouth daily.   Bepotastine Besilate (BEPREVE) 1.5 % SOLN Place 1 drop into both eyes in the morning and at bedtime.    brimonidine (ALPHAGAN) 0.2 % ophthalmic solution Place 1 drop into both eyes 2 (two) times daily.    calcitRIOL (ROCALTROL) 0.25 MCG capsule Take 0.25 mcg by mouth every Monday, Wednesday, and Friday.    carvedilol (COREG) 12.5 MG tablet TAKE 2 TABLETS (25 MG TOTAL) BY MOUTH 2 (TWO) TIMES DAILY. (Patient taking differently: Take 12.5 mg by mouth 2 (two) times daily. )   dorzolamide-timolol (COSOPT) 22.3-6.8 MG/ML ophthalmic solution Place 1 drop into both eyes 2 (two) times daily.    latanoprost (XALATAN) 0.005 % ophthalmic solution Place 1 drop into both eyes at bedtime.    loratadine (CLARITIN) 10 MG tablet Take 1 tablet  (10 mg total) by mouth daily. (Patient taking differently: Take 10 mg by mouth daily as needed for allergies. )   multivitamin (RENA-VIT) TABS tablet Take 1 tablet by mouth daily.    oxyCODONE-acetaminophen (PERCOCET) 5-325 MG tablet Take 1-2 tablets by mouth every 6 (six) hours as needed for moderate pain or severe pain.   OXYGEN Inhale 2 L into the lungs at bedtime.   sertraline (ZOLOFT) 25 MG tablet Take 25 mg by mouth daily.   telmisartan (MICARDIS) 40 MG tablet Take 40 mg by mouth daily.   torsemide (DEMADEX) 100 MG tablet Take 100 mg by mouth daily.   traZODone (DESYREL) 150 MG tablet Take 150 mg by mouth at bedtime.   Vitamin D, Ergocalciferol, (DRISDOL) 50000 units CAPS capsule Take 50,000 Units by mouth every Sunday.    No facility-administered encounter medications on file as of 12/26/2019.      Goals Addressed            This Visit's Progress    Chronic Care Management       CARE PLAN ENTRY (see longitudinal plan of care for additional care plan information)  Current Barriers:   Chronic Disease Management support and education needs related HTN, CHF, DM, HLD and ESRD.  Case Manager Clinical Goal(s):  Over the next 120 days, patient will:  Not require hospitalization or emergent care d/t complications r/t chronic illnesses.  Take all medications as prescribed.  Attend all medical appointments as scheduled.  Monitor blood pressure and record readings.  Follow recommended safety measures to prevent fall and injuries. Over the next 45 days, patient will:  Schedule routine eye exam.  Schedule routine podiatry visit.    Interventions:   Inter-disciplinary care team collaboration (see longitudinal plan of care)  Discussed recent BP readings. Unable to recall exact readings. Reports occasional low readings following dialysis but says most readings have been within range.   Reviewed s/sx of CHF related complications and discussed current activity  tolerance. She continues to use supplemental oxygen at 2L/min during the night. Denies episodes of shortness of breath during the day. Denies chest discomfort, abdominal or lower extremity edema. Denies change/decline in activity tolerance.   Discussed plan for care management follow-up. Lori Stanley reports doing well and remains independent in the home. She required a new arteriovenous graft. The procedure was performed on 11/13/19. Reports episodes of shoulder pain but denies unrelieved pain or worsening symptoms that require immediate medical follow-up. Reports wearing her arm sleeve as advised. We thoroughly discussed her nutrition and importance of protein intake. She feels that she is doing well with maintaining a renal diet. She does recall days when her appetite is very limited d/t fatigue following dialysis. We discussed possible need to supplement with Nepro on these days vice skipping afternoon and evening meals. She agreed to discuss with her Nephrologist. Currently feels that she is doing well and denies urgent concerns or changes with care management needs.    Patient Self Care Activities:   Self administers medications   Attends scheduled provider appointments  Calls pharmacy for medication refills  Performs ADL's independently  Performs IADL's independently   Please see past updates related to this goal by clicking on the "Past Updates" button in the selected goal          PLAN The care management team will follow-up with Lori Stanley within the next two months.    [facility] Care Management [facility] [phone]

## 2020-01-07 NOTE — Patient Instructions (Addendum)
Thank you for allowing the Chronic Care Management team to participate in your care.  Goals Addressed            This Visit's Progress   . Chronic Care Management       CARE PLAN ENTRY (see longitudinal plan of care for additional care plan information)  Current Barriers:  . Chronic Disease Management support and education needs related HTN, CHF, DM, HLD and ESRD.  Case Manager Clinical Goal(s):  Over the next 120 days, patient will: . Not require hospitalization or emergent care d/t complications r/t chronic illnesses. . Take all medications as prescribed. . Attend all medical appointments as scheduled. . Monitor blood pressure and record readings. . Follow recommended safety measures to prevent fall and injuries. Over the next 45 days, patient will: . Schedule routine eye exam. . Schedule routine podiatry visit.    Interventions:  . Inter-disciplinary care team collaboration (see longitudinal plan of care) . Discussed recent BP readings. Unable to recall exact readings. Reports occasional low readings following dialysis but says most readings have been within range.  . Reviewed s/sx of CHF related complications and discussed current activity tolerance. She continues to use supplemental oxygen at 2L/min during the night. Denies episodes of shortness of breath during the day. Denies chest discomfort, abdominal or lower extremity edema. Denies change/decline in activity tolerance.  . Discussed plan for care management follow-up. Lori Stanley reports doing well and remains independent in the home. She required a new arteriovenous graft. The procedure was performed on 11/13/19. Reports episodes of shoulder pain but denies unrelieved pain or worsening symptoms that require immediate medical follow-up. Reports wearing her arm sleeve as advised. We thoroughly discussed her nutrition and importance of protein intake. She feels that she is doing well with maintaining a renal diet. She does  recall days when her appetite is very limited d/t fatigue following dialysis. We discussed possible need to supplement with Nepro on these days vice skipping afternoon and evening meals. She agreed to discuss with her Nephrologist. Currently feels that she is doing well and denies urgent concerns or changes with care management needs.    Patient Self Care Activities:  . Self administers medications  . Attends scheduled provider appointments . Calls pharmacy for medication refills . Performs ADL's independently . Performs IADL's independently   Please see past updates related to this goal by clicking on the "Past Updates" button in the selected goal        Lori Stanley verbalized understanding of the information discussed during the telephonic outreach today. Declined need for a mailed/printed copy of the information.   The care management team will follow-up with Lori Stanley within the next two months.    [facility] [phone]

## 2020-01-07 NOTE — Telephone Encounter (Signed)
Spoke with the patient and she is scheduled with Dr. Delana Meyer for a left arm fistulagram on 01/15/20 with a 1:30 pm arrival time to the [facility]. Covid testing is on 01/13/20 between 8-1 pm at the [facility]. Pre-procedure instructions were discussed and will be mailed.

## 2020-01-10 DIAGNOSIS — N186 End stage renal disease: Secondary | ICD-10-CM | POA: Diagnosis not present

## 2020-01-10 DIAGNOSIS — Z992 Dependence on renal dialysis: Secondary | ICD-10-CM | POA: Diagnosis not present

## 2020-01-13 ENCOUNTER — Other Ambulatory Visit
Admission: RE | Admit: 2020-01-13 | Discharge: 2020-01-13 | Disposition: A | Discharge status: Home / Self Care | Payer: Medicare Other | Source: Ambulatory Visit | Attending: Vascular Surgery | Admitting: Vascular Surgery

## 2020-01-13 ENCOUNTER — Other Ambulatory Visit: Payer: Self-pay

## 2020-01-13 DIAGNOSIS — N186 End stage renal disease: Secondary | ICD-10-CM | POA: Diagnosis not present

## 2020-01-13 DIAGNOSIS — Z992 Dependence on renal dialysis: Secondary | ICD-10-CM | POA: Diagnosis not present

## 2020-01-13 DIAGNOSIS — N2581 Secondary hyperparathyroidism of renal origin: Secondary | ICD-10-CM | POA: Diagnosis not present

## 2020-01-13 DIAGNOSIS — Z01812 Encounter for preprocedural laboratory examination: Secondary | ICD-10-CM | POA: Diagnosis not present

## 2020-01-13 DIAGNOSIS — Z20822 Contact with and (suspected) exposure to covid-19: Secondary | ICD-10-CM | POA: Diagnosis not present

## 2020-01-13 LAB — SARS CORONAVIRUS 2 (TAT 6-24 HRS): SARS Coronavirus 2: NEGATIVE

## 2020-01-13 NOTE — Progress Notes (Signed)
Trena Platt Taqwa Deem,acting as a scribe for [name], MD.,have documented all relevant documentation on the behalf of [name], MD,as directed by  [name], MD while in the presence of [name], MD.  Established patient visit   Patient: Lori Stanley   DOB: May 22, 1931   86 y.o. Female  MRN: [id number] Visit Date: 01/14/2020  Today's healthcare provider: [name], MD   Chief Complaint  Patient presents with  . Hypotension  . Flu Vaccine   Subjective    HPI   Patient presents in office today for a follow up on blood pressure and to have flu shot.   She continues to have some orthostatic symptoms. Essential hypertension From 12/31/2019-Blood pressure actually low and recheck blood pressure is 98/34.  May need to back off on her telmisartan dose she is asymptomatic for any hypotension.  Social History   Tobacco Use  . Smoking status: Former Smoker    Types: Cigarettes  . Smokeless tobacco: Never Used  . Tobacco comment: Quit in 2015-2016  Vaping Use  . Vaping Use: Never used  Substance Use Topics  . Alcohol use: No  . Drug use: No       Medications: Outpatient Medications Prior to Visit  Medication Sig  . acetaminophen (TYLENOL) 500 MG tablet Take 500-1,000 mg by mouth every 6 (six) hours as needed for mild pain or fever.   [name] albuterol (VENTOLIN HFA) 108 (90 Base) MCG/ACT inhaler TAKE 2 PUFFS BY MOUTH EVERY 6 HOURS AS NEEDED FOR WHEEZE OR SHORTNESS OF BREATH (Patient taking differently: Inhale 2 puffs into the lungs every 6 (six) hours as needed for wheezing or shortness of breath. )  . amLODipine (NORVASC) 5 MG tablet TAKE 1 TABLET BY MOUTH EVERY DAY (Patient taking differently: Take 5 mg by mouth daily. )  . aspirin 81 MG chewable tablet Chew 81 mg by mouth daily.  . Bepotastine Besilate (BEPREVE) 1.5 % SOLN Place 1 drop into both eyes in the morning and at bedtime.   . brimonidine (ALPHAGAN) 0.2 % ophthalmic  solution Place 1 drop into both eyes 2 (two) times daily.   . calcitRIOL (ROCALTROL) 0.25 MCG capsule Take 0.25 mcg by mouth every Monday, Wednesday, and Friday.   . dorzolamide-timolol (COSOPT) 22.3-6.8 MG/ML ophthalmic solution Place 1 drop into both eyes 2 (two) times daily.   [name] latanoprost (XALATAN) 0.005 % ophthalmic solution Place 1 drop into both eyes at bedtime.   [name] loratadine (CLARITIN) 10 MG tablet Take 1 tablet (10 mg total) by mouth daily. (Patient taking differently: Take 10 mg by mouth daily as needed for allergies. )  . multivitamin (RENA-VIT) TABS tablet Take 1 tablet by mouth daily.   . OXYGEN Inhale 2 L into the lungs at bedtime.  . sertraline (ZOLOFT) 25 MG tablet Take 25 mg by mouth daily.  [name] torsemide (DEMADEX) 100 MG tablet Take 100 mg by mouth daily.  . traZODone (DESYREL) 150 MG tablet Take 150 mg by mouth at bedtime.  . [DISCONTINUED] telmisartan (MICARDIS) 40 MG tablet Take 40 mg by mouth daily.  . carvedilol (COREG) 12.5 MG tablet TAKE 2 TABLETS (25 MG TOTAL) BY MOUTH 2 (TWO) TIMES DAILY. (Patient not taking: Reported on 01/14/2020)   No facility-administered medications prior to visit.    Review of Systems  Constitutional: Negative for appetite change, chills, fatigue and fever.  Respiratory: Negative for chest tightness and shortness of breath.   Cardiovascular: Negative for chest pain  and palpitations.  Gastrointestinal: Negative for abdominal pain, nausea and vomiting.  Neurological: Negative for dizziness and weakness.       Objective    BP (!) 135/46 (BP Location: Right Arm, Patient Position: Sitting, Cuff Size: Small)   Pulse 69   Temp 98.5 F (36.9 C) (Oral)   Ht 5\' 3"  (1.6 m)   Wt 122 lb (55.3 kg)   BMI 21.61 kg/m  BP Readings from Last 3 Encounters:  01/15/20 140/66  01/14/20 (!) 135/46  01/02/20 (!) 127/47   Wt Readings from Last 3 Encounters:  01/15/20 121 lb 14.6 oz (55.3 kg)  01/14/20 122 lb (55.3 kg)  01/02/20 122 lb (55.3 kg)        Physical Exam Vitals reviewed.  Constitutional:      Appearance: Normal appearance.  HENT:     Head: Normocephalic and atraumatic.     Right Ear: External ear normal.     Left Ear: External ear normal.  Eyes:     General: No scleral icterus.    Conjunctiva/sclera: Conjunctivae normal.  Cardiovascular:     Rate and Rhythm: Normal rate and regular rhythm.     Pulses: Normal pulses.     Heart sounds: Normal heart sounds.  Pulmonary:     Effort: Pulmonary effort is normal.     Breath sounds: Normal breath sounds.  Musculoskeletal:     Right lower leg: No edema.     Left lower leg: No edema.  Skin:    General: Skin is warm and dry.  Neurological:     General: No focal deficit present.     Mental Status: She is alert and oriented to person, place, and time.  Psychiatric:        Mood and Affect: Mood normal.        Behavior: Behavior normal.        Thought Content: Thought content normal.        Judgment: Judgment normal.       No results found for any visits on 01/14/20.  Assessment & Plan     1. Need for immunization against influenza  - Flu Vaccine QUAD High Dose(Fluad)  2. Essential hypertension Decrease dose of ARB. - telmisartan (MICARDIS) 20 MG tablet; Take 1 tablet (20 mg total) by mouth daily.  Dispense: 30 tablet; Refill: 2  3. Acute on chronic heart failure with preserved ejection fraction (HFpEF) ([place])   4. Type 2 diabetes mellitus with hyperlipidemia ([place])   5. Secondary hyperparathyroidism of renal origin ([place])   6. Chronic kidney disease (CKD), stage V ([place])    Return in about 2 months (around 03/15/2020).      I, [name], MD, have reviewed all documentation for this visit. The documentation on 01/19/20 for the exam, diagnosis, procedures, and orders are all accurate and complete.    Richard Cranford Mon, MD  [facility] [phone] (phone) [phone] (fax)  [place]

## 2020-01-14 ENCOUNTER — Other Ambulatory Visit (INDEPENDENT_AMBULATORY_CARE_PROVIDER_SITE_OTHER): Payer: Self-pay | Admitting: Nurse Practitioner

## 2020-01-14 ENCOUNTER — Encounter (INDEPENDENT_AMBULATORY_CARE_PROVIDER_SITE_OTHER): Payer: Self-pay | Admitting: Nurse Practitioner

## 2020-01-14 ENCOUNTER — Encounter: Payer: Self-pay | Admitting: Family Medicine

## 2020-01-14 ENCOUNTER — Ambulatory Visit (INDEPENDENT_AMBULATORY_CARE_PROVIDER_SITE_OTHER): Payer: Medicare Other | Admitting: Family Medicine

## 2020-01-14 ENCOUNTER — Other Ambulatory Visit: Payer: Self-pay

## 2020-01-14 VITALS — BP 135/46 | HR 69 | Temp 98.5°F | Ht 63.0 in | Wt 122.0 lb

## 2020-01-14 DIAGNOSIS — E1169 Type 2 diabetes mellitus with other specified complication: Secondary | ICD-10-CM | POA: Diagnosis not present

## 2020-01-14 DIAGNOSIS — I5033 Acute on chronic diastolic (congestive) heart failure: Secondary | ICD-10-CM

## 2020-01-14 DIAGNOSIS — N2581 Secondary hyperparathyroidism of renal origin: Secondary | ICD-10-CM | POA: Diagnosis not present

## 2020-01-14 DIAGNOSIS — I509 Heart failure, unspecified: Secondary | ICD-10-CM | POA: Diagnosis not present

## 2020-01-14 DIAGNOSIS — J449 Chronic obstructive pulmonary disease, unspecified: Secondary | ICD-10-CM | POA: Diagnosis not present

## 2020-01-14 DIAGNOSIS — I10 Essential (primary) hypertension: Secondary | ICD-10-CM

## 2020-01-14 DIAGNOSIS — R062 Wheezing: Secondary | ICD-10-CM | POA: Diagnosis not present

## 2020-01-14 DIAGNOSIS — Z23 Encounter for immunization: Secondary | ICD-10-CM | POA: Diagnosis not present

## 2020-01-14 DIAGNOSIS — N185 Chronic kidney disease, stage 5: Secondary | ICD-10-CM

## 2020-01-14 DIAGNOSIS — E785 Hyperlipidemia, unspecified: Secondary | ICD-10-CM

## 2020-01-14 MED ORDER — TELMISARTAN 20 MG PO TABS: 20.0000 mg | ORAL_TABLET | Freq: Every day | ORAL | 2 refills | Status: DC

## 2020-01-15 ENCOUNTER — Encounter
Admission: RE | Disposition: A | Discharge status: Home / Self Care | Payer: Self-pay | Source: Home / Self Care | Attending: Vascular Surgery

## 2020-01-15 ENCOUNTER — Other Ambulatory Visit: Payer: Self-pay

## 2020-01-15 ENCOUNTER — Ambulatory Visit
Admission: RE | Admit: 2020-01-15 | Discharge: 2020-01-15 | Disposition: A | Discharge status: Home / Self Care | Payer: Medicare Other | Attending: Vascular Surgery | Admitting: Vascular Surgery

## 2020-01-15 ENCOUNTER — Other Ambulatory Visit: Payer: Self-pay | Admitting: Family Medicine

## 2020-01-15 DIAGNOSIS — Z7982 Long term (current) use of aspirin: Secondary | ICD-10-CM | POA: Diagnosis not present

## 2020-01-15 DIAGNOSIS — I132 Hypertensive heart and chronic kidney disease with heart failure and with stage 5 chronic kidney disease, or end stage renal disease: Secondary | ICD-10-CM | POA: Insufficient documentation

## 2020-01-15 DIAGNOSIS — F329 Major depressive disorder, single episode, unspecified: Secondary | ICD-10-CM | POA: Insufficient documentation

## 2020-01-15 DIAGNOSIS — Y841 Kidney dialysis as the cause of abnormal reaction of the patient, or of later complication, without mention of misadventure at the time of the procedure: Secondary | ICD-10-CM | POA: Diagnosis not present

## 2020-01-15 DIAGNOSIS — Z79899 Other long term (current) drug therapy: Secondary | ICD-10-CM | POA: Diagnosis not present

## 2020-01-15 DIAGNOSIS — I252 Old myocardial infarction: Secondary | ICD-10-CM | POA: Diagnosis not present

## 2020-01-15 DIAGNOSIS — Z6841 Body Mass Index (BMI) 40.0 and over, adult: Secondary | ICD-10-CM | POA: Insufficient documentation

## 2020-01-15 DIAGNOSIS — E669 Obesity, unspecified: Secondary | ICD-10-CM | POA: Diagnosis not present

## 2020-01-15 DIAGNOSIS — F419 Anxiety disorder, unspecified: Secondary | ICD-10-CM | POA: Diagnosis not present

## 2020-01-15 DIAGNOSIS — T82898A Other specified complication of vascular prosthetic devices, implants and grafts, initial encounter: Secondary | ICD-10-CM | POA: Diagnosis not present

## 2020-01-15 DIAGNOSIS — K219 Gastro-esophageal reflux disease without esophagitis: Secondary | ICD-10-CM | POA: Diagnosis not present

## 2020-01-15 DIAGNOSIS — E785 Hyperlipidemia, unspecified: Secondary | ICD-10-CM | POA: Diagnosis not present

## 2020-01-15 DIAGNOSIS — I509 Heart failure, unspecified: Secondary | ICD-10-CM | POA: Diagnosis not present

## 2020-01-15 DIAGNOSIS — E1122 Type 2 diabetes mellitus with diabetic chronic kidney disease: Secondary | ICD-10-CM | POA: Diagnosis not present

## 2020-01-15 DIAGNOSIS — Z992 Dependence on renal dialysis: Secondary | ICD-10-CM | POA: Diagnosis not present

## 2020-01-15 DIAGNOSIS — Z885 Allergy status to narcotic agent status: Secondary | ICD-10-CM | POA: Diagnosis not present

## 2020-01-15 DIAGNOSIS — N186 End stage renal disease: Secondary | ICD-10-CM | POA: Insufficient documentation

## 2020-01-15 DIAGNOSIS — Z888 Allergy status to other drugs, medicaments and biological substances status: Secondary | ICD-10-CM | POA: Insufficient documentation

## 2020-01-15 DIAGNOSIS — T82510A Breakdown (mechanical) of surgically created arteriovenous fistula, initial encounter: Secondary | ICD-10-CM | POA: Insufficient documentation

## 2020-01-15 DIAGNOSIS — N185 Chronic kidney disease, stage 5: Secondary | ICD-10-CM | POA: Diagnosis not present

## 2020-01-15 HISTORY — PX: A/V FISTULAGRAM: CATH118298

## 2020-01-15 LAB — POTASSIUM (ARMC VASCULAR LAB ONLY): Potassium (ARMC vascular lab): 3.7 (ref 3.5–5.1)

## 2020-01-15 SURGERY — A/V FISTULAGRAM
Anesthesia: Moderate Sedation | Laterality: Left

## 2020-01-15 MED ORDER — ONDANSETRON HCL 4 MG/2ML IJ SOLN: 4.0000 mg | Freq: Four times a day (QID) | INTRAMUSCULAR | Status: DC | PRN

## 2020-01-15 MED ORDER — METHYLPREDNISOLONE SODIUM SUCC 125 MG IJ SOLR: 125.0000 mg | Freq: Once | INTRAMUSCULAR | Status: DC | PRN

## 2020-01-15 MED ORDER — IODIXANOL 320 MG/ML IV SOLN
INTRAVENOUS | Status: DC | PRN
  Administered 2020-01-15: 20 mL

## 2020-01-15 MED ORDER — FENTANYL CITRATE (PF) 100 MCG/2ML IJ SOLN
INTRAMUSCULAR | Status: AC
  Filled 2020-01-15: qty 2

## 2020-01-15 MED ORDER — CEFAZOLIN SODIUM-DEXTROSE 1-4 GM/50ML-% IV SOLN: 1.0000 g | Freq: Once | INTRAVENOUS | Status: AC

## 2020-01-15 MED ORDER — FENTANYL CITRATE (PF) 100 MCG/2ML IJ SOLN: 12.5000 ug | Freq: Once | INTRAMUSCULAR | Status: DC | PRN

## 2020-01-15 MED ORDER — SODIUM CHLORIDE 0.9 % IV SOLN: INTRAVENOUS | Status: DC

## 2020-01-15 MED ORDER — MIDAZOLAM HCL 5 MG/5ML IJ SOLN
INTRAMUSCULAR | Status: AC
  Filled 2020-01-15: qty 5

## 2020-01-15 MED ORDER — HEPARIN SODIUM (PORCINE) 1000 UNIT/ML IJ SOLN
INTRAMUSCULAR | Status: AC
  Filled 2020-01-15: qty 1

## 2020-01-15 MED ORDER — FENTANYL CITRATE (PF) 100 MCG/2ML IJ SOLN
INTRAMUSCULAR | Status: DC | PRN
Start: 2020-01-15 — End: 2020-01-15
  Administered 2020-01-15: 50 ug via INTRAVENOUS

## 2020-01-15 MED ORDER — FAMOTIDINE 20 MG PO TABS: 40.0000 mg | ORAL_TABLET | Freq: Once | ORAL | Status: DC | PRN

## 2020-01-15 MED ORDER — DIPHENHYDRAMINE HCL 50 MG/ML IJ SOLN: 50.0000 mg | Freq: Once | INTRAMUSCULAR | Status: DC | PRN

## 2020-01-15 MED ORDER — MIDAZOLAM HCL 2 MG/ML PO SYRP: 8.0000 mg | ORAL_SOLUTION | Freq: Once | ORAL | Status: DC | PRN

## 2020-01-15 MED ORDER — MIDAZOLAM HCL 2 MG/2ML IJ SOLN
INTRAMUSCULAR | Status: DC | PRN
  Administered 2020-01-15: 1 mg via INTRAVENOUS

## 2020-01-15 MED ORDER — CEFAZOLIN SODIUM-DEXTROSE 1-4 GM/50ML-% IV SOLN
INTRAVENOUS | Status: AC
  Administered 2020-01-15: 1 g via INTRAVENOUS
  Filled 2020-01-15: qty 50

## 2020-01-15 SURGICAL SUPPLY — 10 items
DRAPE BRACHIAL (DRAPES) ×3 IMPLANT
NEEDLE ENTRY 21GA 7CM ECHOTIP (NEEDLE) ×3 IMPLANT
PACK ANGIOGRAPHY (CUSTOM PROCEDURE TRAY) ×3 IMPLANT
SET INTRO CAPELLA COAXIAL (SET/KITS/TRAYS/PACK) ×3 IMPLANT
SHEATH BRITE TIP 6FRX5.5 (SHEATH) ×3 IMPLANT
SUT MNCRL 4-0 (SUTURE) ×2
SUT MNCRL 4-0 27XMFL (SUTURE) ×1
SUTURE MNCRL 4-0 27XMF (SUTURE) ×1 IMPLANT
TOWEL OR 17X26 4PK STRL BLUE (TOWEL DISPOSABLE) ×3 IMPLANT
WIRE J 3MM .035X145CM (WIRE) ×3 IMPLANT

## 2020-01-15 NOTE — Op Note (Signed)
OPERATIVE NOTE   PROCEDURE: 1. Contrast injection left brachial axillary AV graft  PRE-OPERATIVE DIAGNOSIS: Complication of dialysis access                                                       End Stage Renal Disease  POST-OPERATIVE DIAGNOSIS: same as above   SURGEON: Katha Cabal, M.D.  ANESTHESIA: Conscious sedation was administered under my direct supervision by the interventional radiology RN.  IV Versed plus fentanyl were utilized. Continuous ECG, pulse oximetry and blood pressure was monitored throughout the entire procedure.  Conscious sedation was for a total of 17 minutes.  ESTIMATED BLOOD LOSS: minimal  FINDING(S): 1. Normal graft no evidence of mid graft stricture  SPECIMEN(S):  None  CONTRAST: 20 cc  FLUOROSCOPY TIME: 0.2 minutes  INDICATIONS: Lori Stanley is a 86 y.o. female who  presents with malfunctioning left arm AV access.  The patient is scheduled for angiography with possible intervention of the AV access to prevent loss of the permanent access.  The patient is aware the risks include but are not limited to: bleeding, infection, thrombosis of the cannulated access, and possible anaphylactic reaction to the contrast.  The patient acknowledges if the access can not be salvaged a tunneled catheter will be needed and will be placed during this procedure.  The patient is aware of the risks of the procedure and elects to proceed with the angiogram and intervention.  DESCRIPTION: After full informed written consent was obtained, the patient was brought back to the Special Procedure suite and placed supine position.  Appropriate cardiopulmonary monitors were placed.  The left arm was prepped and draped in the standard fashion.  Appropriate timeout is called.   The left brachial axillary AV graft access was cannulated with a micropuncture needle under ultrasound guidence.  Ultrasound was used to evaluate the left brachial axillary AV access.  It was echolucent  and compressible indicating it is patent .  An ultrasound image was acquired for the permanent record.  A micropuncture needle was used to access the left brachial axillary AV access under direct ultrasound guidance.  The microwire was then advanced under fluoroscopic guidance without difficulty followed by the micro-sheath.  The J-wire was then advanced and a 6 Fr sheath inserted.  Hand injections were completed to image the access from the arterial anastomosis through the entire access.  The central venous structures were also imaged by hand injections.  Based on the images, there is uniformity of the graft throughout its entire course distal to the taper.  There is no evidence of a narrowing in the midportion of the graft as noted on duplex ultrasound.    A 4-0 Monocryl purse-string suture was sewn around the sheath.  The sheath was removed and light pressure was applied.  A sterile bandage was applied to the puncture site.    COMPLICATIONS: None  CONDITION: Lori Stanley, M.D [facility] Office: [phone]  01/15/2020 4:51 PM

## 2020-01-15 NOTE — Telephone Encounter (Signed)
Requested medications are due for refill today?  Unknown - listed as a historical medication  Requested medications are on active medication list? Yes as a historical medication  Last Refill:   Unknown  Future visit scheduled?  Yes  Notes to Clinic:    Unable to refill historical medication.

## 2020-01-15 NOTE — Telephone Encounter (Signed)
Please advise? Never refilled by pcp.

## 2020-01-15 NOTE — Discharge Instructions (Signed)
Dialysis Fistulogram, Care After This sheet gives you information about how to care for yourself after your procedure. Your health care provider may also give you more specific instructions. If you have problems or questions, contact your health care provider. What can I expect after the procedure? After the procedure, it is common to have:  A small amount of discomfort in the area where the small, thin tube (catheter) was placed for the procedure.  A small amount of bruising around the fistula.  Sleepiness and tiredness (fatigue). Follow these instructions at home: Activity   Rest at home and do not lift anything that is heavier than 5 lb (2.3 kg) on the day after your procedure.  Return to your normal activities as told by your health care provider. Ask your health care provider what activities are safe for you.  Do not drive or use heavy machinery while taking prescription pain medicine.  Do not drive for 24 hours if you were given a medicine to help you relax (sedative) during your procedure. Medicines   Take over-the-counter and prescription medicines only as told by your health care provider. Puncture site care  Follow instructions from your health care provider about how to take care of the site where catheters were inserted. Make sure you: ? Wash your hands with soap and water before you change your bandage (dressing). If soap and water are not available, use hand sanitizer. ? Change your dressing as told by your health care provider. ? Leave stitches (sutures), skin glue, or adhesive strips in place. These skin closures may need to stay in place for 2 weeks or longer. If adhesive strip edges start to loosen and curl up, you may trim the loose edges. Do not remove adhesive strips completely unless your health care provider tells you to do that.  Check your puncture area every day for signs of infection. Check for: ? Redness, swelling, or pain. ? Fluid or  blood. ? Warmth. ? Pus or a bad smell. General instructions  Do not take baths, swim, or use a hot tub until your health care provider approves. Ask your health care provider if you may take showers. You may only be allowed to take sponge baths.  Monitor your dialysis fistula closely. Check to make sure that you can feel a vibration or buzz (a thrill) when you put your fingers over the fistula.  Prevent damage to your graft or fistula: ? Do not wear tight-fitting clothing or jewelry on the arm or leg that has your graft or fistula. ? Tell all your health care providers that you have a dialysis fistula or graft. ? Do not allow blood draws, IVs, or blood pressure readings to be done in the arm that has your fistula or graft. ? Do not allow flu shots or vaccinations in the arm with your fistula or graft.  Keep all follow-up visits as told by your health care provider. This is important. Contact a health care provider if:  You have redness, swelling, or pain at the site where the catheter was put in.  You have fluid or blood coming from the catheter site.  The catheter site feels warm to the touch.  You have pus or a bad smell coming from the catheter site.  You have a fever or chills. Get help right away if:  You feel weak.  You have trouble balancing.  You have trouble moving your arms or legs.  You have problems with your speech or vision.  You   can no longer feel a vibration or buzz when you put your fingers over your dialysis fistula.  The limb that was used for the procedure: ? Swells. ? Is painful. ? Is cold. ? Is discolored, such as blue or pale white.  You have chest pain or shortness of breath. Summary  After a dialysis fistulogram, it is common to have a small amount of discomfort or bruising in the area where the small, thin tube (catheter) was placed.  Rest at home on the day after your procedure. Return to your normal activities as told by your health care  provider.  Take over-the-counter and prescription medicines only as told by your health care provider.  Follow instructions from your health care provider about how to take care of the site where the catheter was inserted.  Keep all follow-up visits as told by your health care provider. This information is not intended to replace advice given to you by your health care provider. Make sure you discuss any questions you have with your health care provider. Document Revised: 05/19/2017 Document Reviewed: 05/19/2017 Elsevier Patient Education  2020 [place].  Dialysis Fistulogram  A dialysis fistulogram is an X-ray test to look inside the site where blood is removed and returned to your body during dialysis. Fistulas and grafts provide easy and effective access for dialysis. However, sometimes problems can occur. The fistula or graft can become clogged or narrow. This can make dialysis less effective. You may need to have a fistulogram to check for these problems. If a problem is found, your health care provider can do treatments during the procedure to clear the blocked or narrowed areas. Treatments to open a blocked dialysis fistula or graft may include:  Removing the clot from the fistula or graft with a device (thrombectomy).  A procedure to open or widen the blocked area with a balloon (angioplasty).  Placing a small mesh tube (stent) to keep the fistula or graft open.  Injecting a medicine to dissolve the clot (thrombolysis). Tell a health care provider about:  Any allergies you have, including any reactions you have had to contrast dye.  All medicines you are taking, including vitamins, herbs, eye drops, creams, and over-the-counter medicines.  Any problems you or family members have had with anesthetic medicines.  Any blood disorders you have.  Any surgeries you have had.  Any medical conditions you have.  Whether you are pregnant or may be pregnant.  Any pain,  redness, or swelling you have in your limb that has the dialysis fistula or graft.  Any bleeding from your dialysis fistula or graft.  Any of the following in the part of your body where the dialysis fistula or graft leads: ? Numbness. ? Tingling. ? A cold feeling. ? Areas that are bluish or pale white in color. What are the risks? Generally, this is a safe procedure. However, problems may occur, including:  Infection.  Bleeding.  Allergic reactions to medicines or dyes.  Damage to other structures, such as nearby nerves or blood vessels.  A blood clot in the dialysis fistula or graft.  A blood clot that travels to your lungs (pulmonary embolism). What happens before the procedure? General instructions  Follow instructions from your health care provider about eating and drinking restrictions.  Ask your health care provider about: ? Changing or stopping your regular medicines. This is especially important if you are taking diabetes medicines or blood thinners (anticoagulants). ? Taking medicines such as aspirin and ibuprofen. These medicines  can thin your blood. Do not take these medicines unless your health care provider tells you to take them. ? Taking over-the-counter medicines, vitamins, herbs, and supplements.  Plan to have someone take you home from the hospital or clinic.  Plan to have a responsible adult care for you for at least 24 hours after you leave the hospital or clinic. This is important.  You may have a blood or urine sample taken. These will help your health care provider learn how well your kidneys and liver are working and how well your blood clots. What happens during the procedure?  An IV will be inserted into one of your veins.  You will be given one or more of the following: ? A medicine to help you relax (sedative). ? A medicine to numb the area (local anesthetic) on the limb with your fistula or graft.  A needle will be inserted into your  vein.  A small, thin tube (catheter) will be inserted into the needle and guided to the area of possible problems.  Dye (contrast material) will be injected into the catheter. As the contrast material passes through your body, you may feel warm.  X-rays will be taken. The contrast material will show where any narrowing or blockages are.  If narrowing or a blockage is seen, the health care provider may do one of the following to open the blockage: ? Thrombectomy. The health care provider will use a mechanical device at the end of the catheter to break up the clot. ? Angioplasty. The health care provider will advance a guide wire and balloon-tipped catheter to the blockage site. The balloon will be inflated for a short period of time. A stent may also be placed to keep the blocked area open. ? Thrombolysis. The health care provider will deliver a clot-dissolving medicine through the catheter.  When the procedure is complete, the catheter will be removed.  In some cases, the catheter site will be closed with stitches (sutures).  Pressure will be applied to stop any bleeding, and your skin will be covered with a bandage (dressing). The procedure may vary among health care providers and hospitals. What happens after the procedure?  Your blood pressure, heart rate, breathing rate, and blood oxygen level will be monitored until you leave the hospital or clinic. Your health care provider will also monitor your access site.  You will need to stay in bed for several hours.  Do not drive for 24 hours if you were given a sedative during your procedure. Summary  A dialysis fistulogram is an X-ray test to look inside the site where blood is removed and returned to your body during dialysis.  If a problem is found, your health care provider can also do treatments during the procedure to clear the blocked or narrowed areas. This information is not intended to replace advice given to you by your health  care provider. Make sure you discuss any questions you have with your health care provider. Document Revised: 05/19/2017 Document Reviewed: 05/19/2017 Elsevier Patient Education  [place].   Moderate Conscious Sedation, Adult, Care After These instructions provide you with information about caring for yourself after your procedure. Your health care provider may also give you more specific instructions. Your treatment has been planned according to current medical practices, but problems sometimes occur. Call your health care provider if you have any problems or questions after your procedure. What can I expect after the procedure? After your procedure, it is common:  To feel sleepy for several hours.  To feel clumsy and have poor balance for several hours.  To have poor judgment for several hours.  To vomit if you eat too soon. Follow these instructions at home: For at least 24 hours after the procedure:   Do not: ? Participate in activities where you could fall or become injured. ? Drive. ? Use heavy machinery. ? Drink alcohol. ? Take sleeping pills or medicines that cause drowsiness. ? Make important decisions or sign legal documents. ? Take care of children on your own.  Rest. Eating and drinking  Follow the diet recommended by your health care provider.  If you vomit: ? Drink water, juice, or soup when you can drink without vomiting. ? Make sure you have little or no nausea before eating solid foods. General instructions  Have a responsible adult stay with you until you are awake and alert.  Take over-the-counter and prescription medicines only as told by your health care provider.  If you smoke, do not smoke without supervision.  Keep all follow-up visits as told by your health care provider. This is important. Contact a health care provider if:  You keep feeling nauseous or you keep vomiting.  You feel light-headed.  You develop a rash.  You have  a fever. Get help right away if:  You have trouble breathing. This information is not intended to replace advice given to you by your health care provider. Make sure you discuss any questions you have with your health care provider. Document Revised: 03/31/2017 Document Reviewed: 08/08/2015 Elsevier Patient Education  2020 [employer].

## 2020-01-15 NOTE — Interval H&P Note (Signed)
History and Physical Interval Note:  01/15/2020 3:59 PM  Lori Stanley  has presented today for surgery, with the diagnosis of LT arm fistulagram   End Stage Renal  Covid  Sept 13.  The various methods of treatment have been discussed with the patient and family. After consideration of risks, benefits and other options for treatment, the patient has consented to  Procedure(s): A/V FISTULAGRAM (Left) as a surgical intervention.  The patient's history has been reviewed, patient examined, no change in status, stable for surgery.  I have reviewed the patient's chart and labs.  Questions were answered to the patient's satisfaction.     [name]

## 2020-01-16 ENCOUNTER — Encounter: Payer: Self-pay | Admitting: Vascular Surgery

## 2020-01-16 DIAGNOSIS — I5022 Chronic systolic (congestive) heart failure: Secondary | ICD-10-CM | POA: Diagnosis not present

## 2020-01-17 DIAGNOSIS — Z992 Dependence on renal dialysis: Secondary | ICD-10-CM | POA: Diagnosis not present

## 2020-01-17 DIAGNOSIS — N186 End stage renal disease: Secondary | ICD-10-CM | POA: Diagnosis not present

## 2020-01-20 ENCOUNTER — Telehealth: Payer: Self-pay

## 2020-01-20 DIAGNOSIS — Z992 Dependence on renal dialysis: Secondary | ICD-10-CM | POA: Diagnosis not present

## 2020-01-20 DIAGNOSIS — N186 End stage renal disease: Secondary | ICD-10-CM | POA: Diagnosis not present

## 2020-01-20 NOTE — Telephone Encounter (Signed)
Please check with the patient, I think she has been on this dose of sertraline for many years.  Please refill this for a year if it is accurate at 25 mg daily.  Thank you

## 2020-01-20 NOTE — Telephone Encounter (Signed)
Copied from [place] [phone]. Topic: General - Other >> Jan 17, 2020  4:50 PM Wynetta Emery, [place] C wrote: Reason for CRM: pt called in for assistance. Pt says that she was told that provider is decreasing her dose for medication sertraline (ZOLOFT) 25 MG tablet, pt says that she was notified by the pharmacy that a change wasn't made. Pt would like further assistance.

## 2020-01-21 NOTE — Telephone Encounter (Signed)
Returned patient's call, unable to leave voicemail. Will try to contact patient again later unless she calls back.

## 2020-01-24 DIAGNOSIS — Z992 Dependence on renal dialysis: Secondary | ICD-10-CM | POA: Diagnosis not present

## 2020-01-24 DIAGNOSIS — N186 End stage renal disease: Secondary | ICD-10-CM | POA: Diagnosis not present

## 2020-01-24 NOTE — Telephone Encounter (Signed)
LMOVM for pt to return call 

## 2020-01-26 ENCOUNTER — Other Ambulatory Visit: Payer: Self-pay | Admitting: Family Medicine

## 2020-01-27 DIAGNOSIS — Z992 Dependence on renal dialysis: Secondary | ICD-10-CM | POA: Diagnosis not present

## 2020-01-27 DIAGNOSIS — N186 End stage renal disease: Secondary | ICD-10-CM | POA: Diagnosis not present

## 2020-01-29 ENCOUNTER — Other Ambulatory Visit: Payer: Self-pay | Admitting: Family Medicine

## 2020-01-29 DIAGNOSIS — I5033 Acute on chronic diastolic (congestive) heart failure: Secondary | ICD-10-CM

## 2020-01-29 NOTE — Telephone Encounter (Signed)
Copied from [place] [phone]. Topic: Quick Communication - Rx Refill/Question >> Jan 29, 2020 11:20 AM Leward Quan A wrote: Medication: aspirin 81 MG chewable tablet   Has the patient contacted their pharmacy? Yes.   (Agent: If no, request that the patient contact the pharmacy for the refill.) (Agent: If yes, when and what did the pharmacy advise?)  Preferred Pharmacy (with phone number or street name): [id number] [place], [place] - [address]  Phone:  [phone] Fax:  [phone]     Agent: Please be advised that RX refills may take up to 3 business days. We ask that you follow-up with your pharmacy.

## 2020-01-29 NOTE — Telephone Encounter (Signed)
Requested medication (s) are due for refill today:yes  Requested medication (s) are on the active medication list:yes  Last refill:  historic provider  Future visit scheduled: yes  Notes to clinic:  historic provider. Recently was denied stating it was discontinued. It is on med list    Requested Prescriptions  Pending Prescriptions Disp Refills   aspirin 81 MG chewable tablet      Sig: Chew 1 tablet (81 mg total) by mouth daily.      Analgesics:  NSAIDS - aspirin Passed - 01/29/2020 12:52 PM      Passed - Patient is not pregnant      Passed - Valid encounter within last 12 months    Recent Outpatient Visits           2 weeks ago Need for immunization against influenza   [facility] Jerrol Banana., MD   4 weeks ago Type 2 diabetes mellitus with hyperlipidemia [facility])   [facility] Jerrol Banana., MD   3 months ago Type 2 diabetes mellitus with hyperlipidemia [facility])   [facility] Jerrol Banana., MD   6 months ago Type 2 diabetes mellitus with hyperlipidemia [facility])   [facility] Jerrol Banana., MD   7 months ago Acute on chronic diastolic CHF (congestive heart failure) [facility])   [facility] Jerrol Banana., MD       Future Appointments             Tomorrow Wellington Hampshire, MD [facility], [place]   In 1 month Jerrol Banana., MD [facility], [place]   In 3 months Jerrol Banana., MD [facility], [facility]

## 2020-01-29 NOTE — Telephone Encounter (Signed)
LMOVM for pt to return call. This is the third message we have left for pt to return call.

## 2020-01-29 NOTE — Telephone Encounter (Signed)
Per Dr. [name] we will discuss this with the pt at her f/u in November.

## 2020-01-30 ENCOUNTER — Ambulatory Visit (INDEPENDENT_AMBULATORY_CARE_PROVIDER_SITE_OTHER): Payer: Medicare Other | Admitting: Cardiovascular Disease

## 2020-01-30 ENCOUNTER — Encounter: Payer: Self-pay | Admitting: Cardiovascular Disease

## 2020-01-30 ENCOUNTER — Other Ambulatory Visit: Payer: Self-pay

## 2020-01-30 VITALS — BP 136/44 | HR 65 | Ht 60.0 in | Wt 124.0 lb

## 2020-01-30 DIAGNOSIS — I5032 Chronic diastolic (congestive) heart failure: Secondary | ICD-10-CM | POA: Diagnosis not present

## 2020-01-30 DIAGNOSIS — I359 Nonrheumatic aortic valve disorder, unspecified: Secondary | ICD-10-CM | POA: Diagnosis not present

## 2020-01-30 DIAGNOSIS — E785 Hyperlipidemia, unspecified: Secondary | ICD-10-CM

## 2020-01-30 DIAGNOSIS — N186 End stage renal disease: Secondary | ICD-10-CM | POA: Diagnosis not present

## 2020-01-30 DIAGNOSIS — I10 Essential (primary) hypertension: Secondary | ICD-10-CM

## 2020-01-30 DIAGNOSIS — Z992 Dependence on renal dialysis: Secondary | ICD-10-CM | POA: Diagnosis not present

## 2020-01-30 MED ORDER — ASPIRIN 81 MG PO CHEW: 81.0000 mg | CHEWABLE_TABLET | Freq: Every day | ORAL | 2 refills | Status: DC

## 2020-01-30 NOTE — Progress Notes (Signed)
Cardiology Office Note   Date:  01/30/2020   ID:  Lori, Stanley 08-13-1931, MRN [id number]  PCP:  Jerrol Banana., MD  Cardiologist:   Kathlyn Sacramento, MD   Chief Complaint  Patient presents with  . Follow-up    3 month. Meds reviewed by the pt.'s bottles. Pt. c/o LE edema.       History of Present Illness: Lori Stanley is a 86 y.o. female who presents for a followup visit regarding chronic diastolic heart failure . She has known history of hypertension, diabetes and end-stage renal disease on hemodialysis twice a week.  Nuclear stress test in January, 2015  showed no evidence of ischemia with normal ejection fraction.  No recent hospitalizations since she was started on dialysis.  Most recent echocardiogram in February of this year showed normal LV systolic function with mild aortic stenosis and mild pulmonary hypertension. She is doing well with no recent chest pain or worsening dyspnea.  Blood pressure has been controlled.   Past Medical History:  Diagnosis Date  . Acute heart failure ([place])   . Anemia   . Anxiety   . CHF (congestive heart failure) ([place])   . Chronic kidney disease    stage V; end stage renal disease  . Chronic low back pain   . Depression   . Diabetes mellitus without complication ([place])   . GERD (gastroesophageal reflux disease)   . Hyperlipidemia   . Hypertension   . MI (myocardial infarction) ([place])   . Obesity     Past Surgical History:  Procedure Laterality Date  . A/V FISTULAGRAM Left 01/15/2020   Procedure: A/V FISTULAGRAM;  Surgeon: Katha Cabal, MD;  Location: [place] [facility];  Service: Cardiovascular;  Laterality: Left;  . ABDOMINAL HYSTERECTOMY    . APPENDECTOMY    . AV FISTULA PLACEMENT Left 11/13/2019   Procedure: INSERTION OF ARTERIOVENOUS (AV) GORE-TEX GRAFT ARM ( BRACHIAL AXILLARY );  Surgeon: Katha Cabal, MD;  Location: [facility];  Service: Vascular;  Laterality: Left;  . DIALYSIS/PERMA CATHETER  INSERTION N/A 06/07/2019   Procedure: DIALYSIS/PERMA CATHETER INSERTION;  Surgeon: Katha Cabal, MD;  Location: [place] [facility];  Service: Cardiovascular;  Laterality: N/A;  . EYE SURGERY    . HERNIA REPAIR    . INCISION AND DRAINAGE PERIRECTAL ABSCESS    . KNEE SURGERY    . THYROID SURGERY    . VAGINAL HYSTERECTOMY       Current Outpatient Medications  Medication Sig Dispense Refill  . acetaminophen (TYLENOL) 500 MG tablet Take 500-1,000 mg by mouth every 6 (six) hours as needed for mild pain or fever.     [name] albuterol (VENTOLIN HFA) 108 (90 Base) MCG/ACT inhaler TAKE 2 PUFFS BY MOUTH EVERY 6 HOURS AS NEEDED FOR WHEEZE OR SHORTNESS OF BREATH 8 g 1  . amLODipine (NORVASC) 5 MG tablet TAKE 1 TABLET BY MOUTH EVERY DAY (Patient taking differently: Take 5 mg by mouth daily. ) 90 tablet 1  . aspirin 81 MG chewable tablet Chew 81 mg by mouth daily.    . Bepotastine Besilate (BEPREVE) 1.5 % SOLN Place 1 drop into both eyes in the morning and at bedtime.     . brimonidine (ALPHAGAN) 0.2 % ophthalmic solution Place 1 drop into both eyes 2 (two) times daily.     . calcitRIOL (ROCALTROL) 0.25 MCG capsule Take 0.25 mcg by mouth every Monday, Wednesday, and Friday.     . carvedilol (COREG)  12.5 MG tablet Take 12.5 mg by mouth 2 (two) times daily with a meal.    . dorzolamide-timolol (COSOPT) 22.3-6.8 MG/ML ophthalmic solution Place 1 drop into both eyes 2 (two) times daily.     [name] latanoprost (XALATAN) 0.005 % ophthalmic solution Place 1 drop into both eyes at bedtime.     [name] loratadine (CLARITIN) 10 MG tablet Take 1 tablet (10 mg total) by mouth daily. 90 tablet 3  . multivitamin (RENA-VIT) TABS tablet Take 1 tablet by mouth daily.     . OXYGEN Inhale 2 L into the lungs at bedtime.    . sertraline (ZOLOFT) 25 MG tablet TAKE 1 TABLET BY MOUTH EVERY DAY 90 tablet 2  . telmisartan (MICARDIS) 40 MG tablet Take 40 mg by mouth daily.    [name] torsemide (DEMADEX) 100 MG tablet Take 100 mg by mouth daily.     . traZODone (DESYREL) 150 MG tablet Take 150 mg by mouth at bedtime.     No current facility-administered medications for this visit.    Allergies:   Antihistamines, chlorpheniramine-type; Paxil [paroxetine]; and Codeine    Social History:  The patient  reports that she has quit smoking. Her smoking use included cigarettes. She has never used smokeless tobacco. She reports that she does not drink alcohol and does not use drugs.   Family History:  The patient's family history includes Cancer in her sister; Heart attack in her mother; Heart disease in her sister.    ROS:  Please see the history of present illness.   Otherwise, review of systems are positive for none.   All other systems are reviewed and negative.    PHYSICAL EXAM: VS:  BP (!) 136/44 (BP Location: Left Arm, Patient Position: Sitting, Cuff Size: Normal)   Pulse 65   Ht 5' (1.524 m)   Wt 124 lb (56.2 kg)   SpO2 96%   BMI 24.22 kg/m  , BMI Body mass index is 24.22 kg/m. GEN: Well nourished, well developed, in no acute distress  HEENT: normal  Neck: no JVD, carotid bruits, or masses Cardiac: RRR; no rubs, or gallops,no edema . 3/ 6 systolic ejection murmur in the aortic area which is mid peaking. Respiratory:  clear to auscultation bilaterally, normal work of breathing GI: soft, nontender, nondistended, + BS MS: no deformity or atrophy  Skin: warm and dry, no rash Neuro:  Strength and sensation are intact Psych: euthymic mood, full affect She has a functioning fistula in the left arm.   EKG:  EKG is ordered today. The ekg ordered today demonstrates  normal sinus rhythm with no significant ST or T wave changes.    Recent Labs: 06/05/2019: TSH 3.498 06/06/2019: B Natriuretic Peptide 549.0 06/12/2019: Magnesium 2.1 08/31/2019: ALT 15 11/12/2019: Platelets 264 11/13/2019: BUN 32; Creatinine, Ser 3.20; Hemoglobin 12.9; Potassium 3.8; Sodium 140    Lipid Panel    Component Value Date/Time   CHOL 233 (H) 11/05/2018  1158   CHOL 137 05/13/2013 0344   TRIG 83 11/05/2018 1158   TRIG 104 05/13/2013 0344   HDL 53 11/05/2018 1158   HDL 43 05/13/2013 0344   CHOLHDL 4.4 11/05/2018 1158   VLDL 21 05/13/2013 0344   LDLCALC 163 (H) 11/05/2018 1158   LDLCALC 73 05/13/2013 0344      Wt Readings from Last 3 Encounters:  01/30/20 124 lb (56.2 kg)  01/15/20 121 lb 14.6 oz (55.3 kg)  01/14/20 122 lb (55.3 kg)      No flowsheet data  found.    ASSESSMENT AND PLAN:  1.  Chronic diastolic heart failure: She is doing very well overall with no evidence of volume overload.  Fluid management is done via dialysis but at the same time she is on torsemide as she continues to make some urine..  She takes torsemide 100 mg daily.   2. Essential hypertension: Blood pressure is well controlled on current medications.  3.  Aortic stenosis: I reviewed most recent echocardiogram in February and there was evidence of mild aortic stenosis with valve area of 1.9.  She has a cardiac murmur consistent with at least mild to moderate aortic stenosis.  Even if this gets worse in the future, I doubt that she will be a good candidate for TAVR given her age and the fact that she is on dialysis.   Disposition:   FU with me in 6 months.  Signed,  Kathlyn Sacramento, MD  01/30/2020 10:42 AM    [place]

## 2020-01-30 NOTE — Patient Instructions (Signed)

## 2020-01-31 DIAGNOSIS — Z992 Dependence on renal dialysis: Secondary | ICD-10-CM | POA: Diagnosis not present

## 2020-01-31 DIAGNOSIS — N186 End stage renal disease: Secondary | ICD-10-CM | POA: Diagnosis not present

## 2020-02-03 ENCOUNTER — Telehealth (INDEPENDENT_AMBULATORY_CARE_PROVIDER_SITE_OTHER): Payer: Self-pay

## 2020-02-03 DIAGNOSIS — N186 End stage renal disease: Secondary | ICD-10-CM | POA: Diagnosis not present

## 2020-02-03 DIAGNOSIS — Z992 Dependence on renal dialysis: Secondary | ICD-10-CM | POA: Diagnosis not present

## 2020-02-03 NOTE — Telephone Encounter (Signed)
I attempted to contact the patient and a message was left for a return call. 

## 2020-02-07 ENCOUNTER — Other Ambulatory Visit
Admission: RE | Admit: 2020-02-07 | Discharge: 2020-02-07 | Disposition: A | Discharge status: Home / Self Care | Payer: Medicare Other | Source: Ambulatory Visit | Attending: Vascular Surgery | Admitting: Vascular Surgery

## 2020-02-07 ENCOUNTER — Other Ambulatory Visit: Payer: Self-pay

## 2020-02-07 DIAGNOSIS — Z01812 Encounter for preprocedural laboratory examination: Secondary | ICD-10-CM | POA: Insufficient documentation

## 2020-02-07 DIAGNOSIS — N186 End stage renal disease: Secondary | ICD-10-CM | POA: Diagnosis not present

## 2020-02-07 DIAGNOSIS — Z20822 Contact with and (suspected) exposure to covid-19: Secondary | ICD-10-CM | POA: Insufficient documentation

## 2020-02-07 DIAGNOSIS — Z992 Dependence on renal dialysis: Secondary | ICD-10-CM | POA: Diagnosis not present

## 2020-02-07 LAB — SARS CORONAVIRUS 2 (TAT 6-24 HRS): SARS Coronavirus 2: NEGATIVE

## 2020-02-10 DIAGNOSIS — Z992 Dependence on renal dialysis: Secondary | ICD-10-CM | POA: Diagnosis not present

## 2020-02-10 DIAGNOSIS — N2581 Secondary hyperparathyroidism of renal origin: Secondary | ICD-10-CM | POA: Diagnosis not present

## 2020-02-10 DIAGNOSIS — E119 Type 2 diabetes mellitus without complications: Secondary | ICD-10-CM | POA: Diagnosis not present

## 2020-02-10 DIAGNOSIS — N186 End stage renal disease: Secondary | ICD-10-CM | POA: Diagnosis not present

## 2020-02-10 DIAGNOSIS — Z794 Long term (current) use of insulin: Secondary | ICD-10-CM | POA: Diagnosis not present

## 2020-02-11 ENCOUNTER — Other Ambulatory Visit (INDEPENDENT_AMBULATORY_CARE_PROVIDER_SITE_OTHER): Payer: Self-pay | Admitting: Nurse Practitioner

## 2020-02-11 ENCOUNTER — Ambulatory Visit
Admission: RE | Admit: 2020-02-11 | Discharge: 2020-02-11 | Disposition: A | Discharge status: Home / Self Care | Payer: Medicare Other | Attending: Vascular Surgery | Admitting: Vascular Surgery

## 2020-02-11 ENCOUNTER — Encounter
Admission: RE | Disposition: A | Discharge status: Home / Self Care | Payer: Self-pay | Source: Home / Self Care | Attending: Vascular Surgery

## 2020-02-11 ENCOUNTER — Other Ambulatory Visit: Payer: Self-pay

## 2020-02-11 DIAGNOSIS — Z4901 Encounter for fitting and adjustment of extracorporeal dialysis catheter: Secondary | ICD-10-CM | POA: Insufficient documentation

## 2020-02-11 DIAGNOSIS — N186 End stage renal disease: Secondary | ICD-10-CM | POA: Insufficient documentation

## 2020-02-11 DIAGNOSIS — Z992 Dependence on renal dialysis: Secondary | ICD-10-CM | POA: Diagnosis not present

## 2020-02-11 HISTORY — PX: DIALYSIS/PERMA CATHETER REMOVAL: CATH118289

## 2020-02-11 SURGERY — DIALYSIS/PERMA CATHETER REMOVAL
Anesthesia: LOCAL

## 2020-02-11 MED ORDER — LIDOCAINE-EPINEPHRINE (PF) 1 %-1:200000 IJ SOLN
INTRAMUSCULAR | Status: DC | PRN
  Administered 2020-02-11: 30 mL

## 2020-02-11 SURGICAL SUPPLY — 5 items
CHLORAPREP W/TINT 26 (MISCELLANEOUS) ×2 IMPLANT
FORCEPS HALSTEAD CVD 5IN STRL (INSTRUMENTS) ×2 IMPLANT
SCALPEL PROTECTED #11 DISP (BLADE) ×2 IMPLANT
TOWEL OR 17X26 4PK STRL BLUE (TOWEL DISPOSABLE) ×2 IMPLANT
TRAY LACERAT/PLASTIC (MISCELLANEOUS) ×2 IMPLANT

## 2020-02-11 NOTE — Op Note (Signed)
Operative Note  Preoperative diagnosis:    1. ESRD with functional permanent access  Postoperative diagnosis:   1. ESRD with functional permanent access  Procedure:  Removal of Right Permcath  Clinication: Hezzie Bump PA-C  Surgeon:  Cara Jubilee, MD  Anesthesia:  Local  EBL:  Minimal  Indication for the Procedure:  The patient has a functional permanent dialysis access and no longer needs their permcath.  This can be removed.  Risks and benefits are discussed and informed consent is obtained.  Description of the Procedure:  The patient's right neck, chest and existing catheter were sterilely prepped and draped. The area around the catheter was anesthetized copiously with 1% lidocaine. The catheter was dissected out with curved hemostats until the cuff was freed from the surrounding fibrous sheath. The fiber sheath was transected, and the catheter was then removed in its entirety using gentle traction. Pressure was held and sterile dressings were placed. The patient tolerated the procedure well and was taken to the recovery room in stable condition.  Jenene Kauffmann A Kamisha Ell  02/11/2020, 2:21 PM  This note was created with Dragon Medical transcription system. Any errors in dictation are purely unintentional.

## 2020-02-11 NOTE — Discharge Instructions (Signed)
Tunneled Catheter Removal, Care After Refer to this sheet in the next few weeks. These instructions provide you with information about caring for yourself after your procedure. Your health care provider may also give you more specific instructions. Your treatment has been planned according to current medical practices, but problems sometimes occur. Call your health care provider if you have any problems or questions after your procedure. What can I expect after the procedure? After the procedure, it is common to have: Some mild redness, swelling, and pain around your catheter site.   Follow these instructions at home: Incision care  Check your removal site  every day for signs of infection. Check for: More redness, swelling, or pain. More fluid or blood. Warmth. Pus or a bad smell. Remove your dressing in 48hrs leave open to air  Activity  Return to your normal activities as told by your health care provider. Ask your health care provider what activities are safe for you. Do not lift anything that is heavier than 10 lb (4.5 kg) for 3 days  You may shower tomorrow  Contact a health care provider if: You have more fluid or blood coming from your removal site You have more redness, swelling, or pain at your incisions or around the area where your catheter was removed Your removal site feel warm to the touch. You feel unusually weak. You feel nauseous.. Get help right away if You have swelling in your arm, shoulder, neck, or face. You develop chest pain. You have difficulty breathing. You feel dizzy or light-headed. You have pus or a bad smell coming from your removal site You have a fever. You develop bleeding from your removal site, and your bleeding does not stop. This information is not intended to replace advice given to you by your health care provider. Make sure you discuss any questions you have with your health care provider. Document Released: 04/04/2012 Document Revised:  12/20/2015 Document Reviewed: 01/12/2015 Elsevier Interactive Patient Education  2017 [employer] 

## 2020-02-12 ENCOUNTER — Encounter: Payer: Self-pay | Admitting: Vascular Surgery

## 2020-02-13 DIAGNOSIS — J449 Chronic obstructive pulmonary disease, unspecified: Secondary | ICD-10-CM | POA: Diagnosis not present

## 2020-02-13 DIAGNOSIS — R062 Wheezing: Secondary | ICD-10-CM | POA: Diagnosis not present

## 2020-02-13 DIAGNOSIS — I509 Heart failure, unspecified: Secondary | ICD-10-CM | POA: Diagnosis not present

## 2020-02-14 DIAGNOSIS — Z992 Dependence on renal dialysis: Secondary | ICD-10-CM | POA: Diagnosis not present

## 2020-02-14 DIAGNOSIS — N186 End stage renal disease: Secondary | ICD-10-CM | POA: Diagnosis not present

## 2020-02-17 DIAGNOSIS — N186 End stage renal disease: Secondary | ICD-10-CM | POA: Diagnosis not present

## 2020-02-17 DIAGNOSIS — Z992 Dependence on renal dialysis: Secondary | ICD-10-CM | POA: Diagnosis not present

## 2020-02-21 DIAGNOSIS — Z992 Dependence on renal dialysis: Secondary | ICD-10-CM | POA: Diagnosis not present

## 2020-02-21 DIAGNOSIS — N186 End stage renal disease: Secondary | ICD-10-CM | POA: Diagnosis not present

## 2020-02-24 DIAGNOSIS — D509 Iron deficiency anemia, unspecified: Secondary | ICD-10-CM | POA: Diagnosis not present

## 2020-02-24 DIAGNOSIS — N186 End stage renal disease: Secondary | ICD-10-CM | POA: Diagnosis not present

## 2020-02-24 DIAGNOSIS — Z992 Dependence on renal dialysis: Secondary | ICD-10-CM | POA: Diagnosis not present

## 2020-02-24 NOTE — H&P (Signed)
[facility] Admission History & Physical  MRN : [id number]  Lori Stanley is a 86 y.o. (30-Jun-1931) female who presents with chief complaint of scheduled PermCath removal.  History of Present Illness: I am asked to evaluate the patient by the dialysis center. The patient was sent here because they have a functioning upper extremity dialysis access.The patient reports there has not been any problems with dialysis runs. They are reporting good flows with good parameters at dialysis. Patient denies pain or tenderness overlying the access. There is no pain with dialysis. The patient denies hand pain or finger pain consistent with steal syndrome. No fevers or chills while on dialysis  No current facility-administered medications for this encounter.   Current Outpatient Medications  Medication Sig Dispense Refill  . acetaminophen (TYLENOL) 500 MG tablet Take 500-1,000 mg by mouth every 6 (six) hours as needed for mild pain or fever.     [name] albuterol (VENTOLIN HFA) 108 (90 Base) MCG/ACT inhaler TAKE 2 PUFFS BY MOUTH EVERY 6 HOURS AS NEEDED FOR WHEEZE OR SHORTNESS OF BREATH 8 g 1  . amLODipine (NORVASC) 5 MG tablet TAKE 1 TABLET BY MOUTH EVERY DAY (Patient taking differently: Take 5 mg by mouth daily. ) 90 tablet 1  . aspirin 81 MG chewable tablet Chew 1 tablet (81 mg total) by mouth daily. 30 tablet 2  . Bepotastine Besilate (BEPREVE) 1.5 % SOLN Place 1 drop into both eyes in the morning and at bedtime. Morning & afternoon.    . brimonidine (ALPHAGAN) 0.2 % ophthalmic solution Place 1 drop into both eyes 2 (two) times daily.     . calcitRIOL (ROCALTROL) 0.25 MCG capsule Take 0.25 mcg by mouth every Monday, Wednesday, and Friday.     . carvedilol (COREG) 12.5 MG tablet Take 12.5 mg by mouth 2 (two) times daily with a meal.    . dorzolamide-timolol (COSOPT) 22.3-6.8 MG/ML ophthalmic solution Place 1 drop into both eyes 2 (two) times daily.     [name] latanoprost (XALATAN)  0.005 % ophthalmic solution Place 1 drop into both eyes at bedtime.     [name] loratadine (CLARITIN) 10 MG tablet Take 1 tablet (10 mg total) by mouth daily. (Patient taking differently: Take 10 mg by mouth daily as needed for allergies. ) 90 tablet 3  . multivitamin (RENA-VIT) TABS tablet Take 1 tablet by mouth 2 (two) times a week. Mondays & Fridays    . OXYGEN Inhale 2 L into the lungs at bedtime.    . sertraline (ZOLOFT) 25 MG tablet TAKE 1 TABLET BY MOUTH EVERY DAY (Patient taking differently: Take 25 mg by mouth daily. ) 90 tablet 2  . telmisartan (MICARDIS) 40 MG tablet Take 40 mg by mouth daily.    [name] torsemide (DEMADEX) 100 MG tablet Take 100 mg by mouth daily.    . traZODone (DESYREL) 150 MG tablet Take 150 mg by mouth at bedtime.     Past Medical History:  Diagnosis Date  . Acute heart failure ([place])   . Anemia   . Anxiety   . CHF (congestive heart failure) ([place])   . Chronic kidney disease    stage V; end stage renal disease  . Chronic low back pain   . Depression   . Diabetes mellitus without complication ([place])   . GERD (gastroesophageal reflux disease)   . Hyperlipidemia   . Hypertension   . MI (myocardial infarction) ([place])   . Obesity    Past Surgical History:  Procedure  Laterality Date  . A/V FISTULAGRAM Left 01/15/2020   Procedure: A/V FISTULAGRAM;  Surgeon: Katha Cabal, MD;  Location: [facility];  Service: Cardiovascular;  Laterality: Left;  . ABDOMINAL HYSTERECTOMY    . APPENDECTOMY    . AV FISTULA PLACEMENT Left 11/13/2019   Procedure: INSERTION OF ARTERIOVENOUS (AV) GORE-TEX GRAFT ARM ( BRACHIAL AXILLARY );  Surgeon: Katha Cabal, MD;  Location: [facility];  Service: Vascular;  Laterality: Left;  . DIALYSIS/PERMA CATHETER INSERTION N/A 06/07/2019   Procedure: DIALYSIS/PERMA CATHETER INSERTION;  Surgeon: Katha Cabal, MD;  Location: [facility];  Service: Cardiovascular;  Laterality: N/A;  . DIALYSIS/PERMA CATHETER REMOVAL N/A 02/11/2020    Procedure: DIALYSIS/PERMA CATHETER REMOVAL;  Surgeon: Katha Cabal, MD;  Location: [facility];  Service: Cardiovascular;  Laterality: N/A;  . EYE SURGERY    . HERNIA REPAIR    . INCISION AND DRAINAGE PERIRECTAL ABSCESS    . KNEE SURGERY    . THYROID SURGERY    . VAGINAL HYSTERECTOMY     Social History Social History   Tobacco Use  . Smoking status: Former Smoker    Types: Cigarettes  . Smokeless tobacco: Never Used  . Tobacco comment: Quit in 2015-2016  Vaping Use  . Vaping Use: Never used  Substance Use Topics  . Alcohol use: No  . Drug use: No   Family History Family History  Problem Relation Age of Onset  . Heart attack Mother   . Heart disease Sister   . Cancer Sister   No family history of bleeding or clotting disorders, autoimmune disease or porphyria  Allergies  Allergen Reactions  . Antihistamines, Chlorpheniramine-Type Other (See Comments)    Unknown reaction.  [name] Paxil [Paroxetine]     "makes her feel funny" not in a good way  . Codeine Rash and Other (See Comments)    Mouth sore   REVIEW OF SYSTEMS (Negative unless checked)  Constitutional: [] Weight loss  [] Fever  [] Chills Cardiac: [] Chest pain   [] Chest pressure   [] Palpitations   [] Shortness of breath when laying flat   [] Shortness of breath at rest   [x] Shortness of breath with exertion. Vascular:  [] Pain in legs with walking   [] Pain in legs at rest   [] Pain in legs when laying flat   [] Claudication   [] Pain in feet when walking  [] Pain in feet at rest  [] Pain in feet when laying flat   [] History of DVT   [] Phlebitis   [] Swelling in legs   [] Varicose veins   [] Non-healing ulcers Pulmonary:   [] Uses home oxygen   [] Productive cough   [] Hemoptysis   [] Wheeze  [] COPD   [] Asthma Neurologic:  [] Dizziness  [] Blackouts   [] Seizures   [] History of stroke   [] History of TIA  [] Aphasia   [] Temporary blindness   [] Dysphagia   [] Weakness or numbness in arms   [] Weakness or numbness in  legs Musculoskeletal:  [] Arthritis   [] Joint swelling   [] Joint pain   [] Low back pain Hematologic:  [] Easy bruising  [] Easy bleeding   [] Hypercoagulable state   [x] Anemic  [] Hepatitis Gastrointestinal:  [] Blood in stool   [] Vomiting blood  [] Gastroesophageal reflux/heartburn   [] Difficulty swallowing. Genitourinary:  [x] Chronic kidney disease   [] Difficult urination  [] Frequent urination  [] Burning with urination   [] Blood in urine Skin:  [] Rashes   [] Ulcers   [] Wounds Psychological:  [] History of anxiety   []  History of major depression.  Physical Examination  Vitals:  02/11/20 1330 02/11/20 1425  BP: (!) 154/57 (!) 123/94  Pulse: 72 71  Resp: 16   SpO2: 96% 93%   There is no height or weight on file to calculate BMI. Gen: WD/WN, NAD Head: [place]/AT, No temporalis wasting. Prominent temp pulse not noted. Ear/Nose/Throat: Hearing grossly intact, nares w/o erythema or drainage, oropharynx w/o Erythema/Exudate,  Eyes: Conjunctiva clear, sclera non-icteric Neck: Trachea midline.  No JVD.  Pulmonary:  Good air movement, respirations not labored, no use of accessory muscles.  Cardiac: RRR, normal S1, S2. Vascular:  Vessel Right Left  Radial Palpable Palpable  Ulnar Not Palpable Not Palpable  Brachial Palpable Palpable  Carotid Palpable, without bruit Palpable, without bruit  Gastrointestinal: soft, non-tender/non-distended. No guarding/reflex.  Musculoskeletal: M/S 5/5 throughout.  Extremities without ischemic changes.  No deformity or atrophy.  Neurologic: Sensation grossly intact in extremities.  Symmetrical.  Speech is fluent. Motor exam as listed above. Psychiatric: Judgment intact, Mood & affect appropriate for pt's clinical situation. Dermatologic: No rashes or ulcers noted.  No cellulitis or open wounds. Lymph : No Cervical, Axillary, or Inguinal lymphadenopathy.  CBC Lab Results  Component Value Date   WBC 5.0 11/12/2019   HGB 12.9 11/13/2019   HCT 38.0 11/13/2019   MCV  86.5 11/12/2019   PLT 264 11/12/2019   BMET    Component Value Date/Time   NA 140 11/13/2019 1215   NA 143 04/10/2019 1353   NA 132 (L) 05/15/2013 0512   K 3.8 11/13/2019 1215   K 4.0 05/15/2013 0512   CL 104 11/13/2019 1215   CL 99 05/15/2013 0512   CO2 25 11/12/2019 1158   CO2 28 05/15/2013 0512   GLUCOSE 84 11/13/2019 1215   GLUCOSE 95 05/15/2013 0512   BUN 32 (H) 11/13/2019 1215   BUN 40 (H) 04/10/2019 1353   BUN 80 (H) 05/15/2013 0512   CREATININE 3.20 (H) 11/13/2019 1215   CREATININE 2.27 (H) 05/15/2013 0512   CALCIUM 9.8 11/12/2019 1158   CALCIUM 8.0 (L) 05/15/2013 0512   GFRNONAA 15 (L) 11/12/2019 1158   GFRNONAA 20 (L) 05/15/2013 0512   GFRAA 17 (L) 11/12/2019 1158   GFRAA 23 (L) 05/15/2013 0512   CrCl cannot be calculated (Patient's most recent lab result is older than the maximum 21 days allowed.).  COAG Lab Results  Component Value Date   INR 1.1 11/12/2019   INR 1.2 06/07/2019   Radiology PERIPHERAL VASCULAR CATHETERIZATION  Result Date: 02/11/2020 See op note  Assessment/Plan  1.  End-stage renal disease requiring hemodialysis:  The patient presents today for a scheduled PermCath removal. At this time, patient has a functioning upper extremity dialysis access and is in no longer in need of his PermCath. Plan for removal. Procedure, risks and benefits were found to the patient. All questions were answered. Patient was to proceed. Patient will continue dialysis therapy without further interruption if a successful intervention is not achieved then a tunneled catheter will be placed. Dialysis has already been arranged  2.  Hypertension:  Patient will continue medical management; nephrology is following no changes in oral medications.  3. Diabetes mellitus:  Glucose will be monitored and oral medications been held this morning once the patient has undergone the patient's procedure po intake will be reinitiated and again Accu-Cheks will be used to assess  the blood glucose level and treat as needed. The patient will be restarted on the patient's usual hypoglycemic regime  4.  Coronary artery disease:  EKG will be monitored. Nitrates  will be used if needed. The patient's oral cardiac medications will be continued.  Discussed with Dr. Francene Castle, PA-C  02/24/2020 3:34 PM

## 2020-02-27 ENCOUNTER — Telehealth: Payer: Self-pay

## 2020-02-27 DIAGNOSIS — E1140 Type 2 diabetes mellitus with diabetic neuropathy, unspecified: Secondary | ICD-10-CM | POA: Diagnosis not present

## 2020-02-27 DIAGNOSIS — B351 Tinea unguium: Secondary | ICD-10-CM | POA: Diagnosis not present

## 2020-02-27 NOTE — Telephone Encounter (Signed)
  Chronic Care Management   Outreach Note  02/27/2020 Name: Lori Stanley MRN: [id number] DOB: 1932/01/04  Primary Care Provider: Jerrol Banana., MD Reason for referral : Chronic Care Management   An unsuccessful telephone outreach was attempted today. Lori Stanley is currently engaged with the chronic care management team.    Follow Up Plan:  A HIPAA compliant voice message was left today requesting a return call.     [facility] Care Management [facility] [phone]

## 2020-02-28 DIAGNOSIS — Z992 Dependence on renal dialysis: Secondary | ICD-10-CM | POA: Diagnosis not present

## 2020-02-28 DIAGNOSIS — N186 End stage renal disease: Secondary | ICD-10-CM | POA: Diagnosis not present

## 2020-03-01 DIAGNOSIS — Z992 Dependence on renal dialysis: Secondary | ICD-10-CM | POA: Diagnosis not present

## 2020-03-01 DIAGNOSIS — N186 End stage renal disease: Secondary | ICD-10-CM | POA: Diagnosis not present

## 2020-03-02 DIAGNOSIS — N186 End stage renal disease: Secondary | ICD-10-CM | POA: Diagnosis not present

## 2020-03-02 DIAGNOSIS — I5022 Chronic systolic (congestive) heart failure: Secondary | ICD-10-CM | POA: Diagnosis not present

## 2020-03-02 DIAGNOSIS — Z992 Dependence on renal dialysis: Secondary | ICD-10-CM | POA: Diagnosis not present

## 2020-03-06 DIAGNOSIS — Z992 Dependence on renal dialysis: Secondary | ICD-10-CM | POA: Diagnosis not present

## 2020-03-06 DIAGNOSIS — N186 End stage renal disease: Secondary | ICD-10-CM | POA: Diagnosis not present

## 2020-03-09 DIAGNOSIS — N186 End stage renal disease: Secondary | ICD-10-CM | POA: Diagnosis not present

## 2020-03-09 DIAGNOSIS — N2581 Secondary hyperparathyroidism of renal origin: Secondary | ICD-10-CM | POA: Diagnosis not present

## 2020-03-09 DIAGNOSIS — Z992 Dependence on renal dialysis: Secondary | ICD-10-CM | POA: Diagnosis not present

## 2020-03-13 DIAGNOSIS — N186 End stage renal disease: Secondary | ICD-10-CM | POA: Diagnosis not present

## 2020-03-13 DIAGNOSIS — Z992 Dependence on renal dialysis: Secondary | ICD-10-CM | POA: Diagnosis not present

## 2020-03-15 DIAGNOSIS — R062 Wheezing: Secondary | ICD-10-CM | POA: Diagnosis not present

## 2020-03-15 DIAGNOSIS — I509 Heart failure, unspecified: Secondary | ICD-10-CM | POA: Diagnosis not present

## 2020-03-15 DIAGNOSIS — J449 Chronic obstructive pulmonary disease, unspecified: Secondary | ICD-10-CM | POA: Diagnosis not present

## 2020-03-16 DIAGNOSIS — Z992 Dependence on renal dialysis: Secondary | ICD-10-CM | POA: Diagnosis not present

## 2020-03-16 DIAGNOSIS — N186 End stage renal disease: Secondary | ICD-10-CM | POA: Diagnosis not present

## 2020-03-17 ENCOUNTER — Ambulatory Visit: Payer: Self-pay | Admitting: Family Medicine

## 2020-03-17 NOTE — Progress Notes (Signed)
Patient ID: RYAN PALERMO, female    DOB: Oct 15, 1931, 86 y.o.   MRN: [id number]  HPI  Ms Harbin is a 86 y/o female with a history of DM, hyperlipidemia, HTN, CKD, previous tobacco use and chronic heart failure.   Echo report from 06/05/19 reviewed and showed an EF of 60-65% along with mildly elevated PA pressure, moderate LVH and a 7cm left pleural effusion.   Has not been admitted or been in the ED in the last 6 months.   She presents today for a follow-up visit with a chief complaint of minimal fatigue upon moderate exertion. She describes this as chronic in nature having been present for several years. She has associated pedal edema and rhinorrhea along with this. She denies any difficulty sleeping, dizziness, abdominal distention, palpitations, chest pain, shortness of breath, cough or weight gain.   Says that her weight has declined because some days she just isn't hungry. Says that she is using Mrs. Dash for seasoning. Gets dialysis twice/ week.   Past Medical History:  Diagnosis Date  . Acute heart failure ([place])   . Anemia   . Anxiety   . CHF (congestive heart failure) ([place])   . Chronic kidney disease    stage V; end stage renal disease  . Chronic low back pain   . Depression   . Diabetes mellitus without complication ([place])   . GERD (gastroesophageal reflux disease)   . Hyperlipidemia   . Hypertension   . MI (myocardial infarction) ([place])   . Obesity    Past Surgical History:  Procedure Laterality Date  . A/V FISTULAGRAM Left 01/15/2020   Procedure: A/V FISTULAGRAM;  Surgeon: Katha Cabal, MD;  Location: [facility];  Service: Cardiovascular;  Laterality: Left;  . ABDOMINAL HYSTERECTOMY    . APPENDECTOMY    . AV FISTULA PLACEMENT Left 11/13/2019   Procedure: INSERTION OF ARTERIOVENOUS (AV) GORE-TEX GRAFT ARM ( BRACHIAL AXILLARY );  Surgeon: Katha Cabal, MD;  Location: [facility];  Service: Vascular;  Laterality: Left;  . DIALYSIS/PERMA CATHETER  INSERTION N/A 06/07/2019   Procedure: DIALYSIS/PERMA CATHETER INSERTION;  Surgeon: Katha Cabal, MD;  Location: [facility];  Service: Cardiovascular;  Laterality: N/A;  . DIALYSIS/PERMA CATHETER REMOVAL N/A 02/11/2020   Procedure: DIALYSIS/PERMA CATHETER REMOVAL;  Surgeon: Katha Cabal, MD;  Location: [facility];  Service: Cardiovascular;  Laterality: N/A;  . EYE SURGERY    . HERNIA REPAIR    . INCISION AND DRAINAGE PERIRECTAL ABSCESS    . KNEE SURGERY    . THYROID SURGERY    . VAGINAL HYSTERECTOMY     Family History  Problem Relation Age of Onset  . Heart attack Mother   . Heart disease Sister   . Cancer Sister    Social History   Tobacco Use  . Smoking status: Former Smoker    Types: Cigarettes  . Smokeless tobacco: Never Used  . Tobacco comment: Quit in 2015-2016  Substance Use Topics  . Alcohol use: No   Allergies  Allergen Reactions  . Antihistamines, Chlorpheniramine-Type Other (See Comments)    Unknown reaction.  [name] Paxil [Paroxetine]     "makes her feel funny" not in a good way  . Codeine Rash and Other (See Comments)    Mouth sore   Prior to Admission medications   Medication Sig Start Date End Date Taking? Authorizing Provider  acetaminophen (TYLENOL) 500 MG tablet Take 500-1,000 mg by mouth every 6 (six) hours  as needed for mild pain or fever.    Yes [provider]  albuterol (VENTOLIN HFA) 108 (90 Base) MCG/ACT inhaler TAKE 2 PUFFS BY MOUTH EVERY 6 HOURS AS NEEDED FOR WHEEZE OR SHORTNESS OF BREATH 10/05/19  Yes Jerrol Banana., MD  amLODipine (NORVASC) 5 MG tablet TAKE 1 TABLET BY MOUTH EVERY DAY Patient taking differently: Take 5 mg by mouth daily.  11/06/19  Yes Jerrol Banana., MD  aspirin 81 MG chewable tablet Chew 1 tablet (81 mg total) by mouth daily. 01/30/20  Yes Jerrol Banana., MD  Bepotastine Besilate (BEPREVE) 1.5 % SOLN Place 1 drop into both eyes in the morning and at bedtime. Morning &  afternoon.   Yes [provider]  brimonidine (ALPHAGAN) 0.2 % ophthalmic solution Place 1 drop into both eyes 2 (two) times daily.    Yes [provider]  calcitRIOL (ROCALTROL) 0.25 MCG capsule Take 0.25 mcg by mouth every Monday, Wednesday, and Friday.  10/23/18  Yes [provider]  carvedilol (COREG) 12.5 MG tablet Take 12.5 mg by mouth 2 (two) times daily with a meal.   Yes [provider]  dorzolamide-timolol (COSOPT) 22.3-6.8 MG/ML ophthalmic solution Place 1 drop into both eyes 2 (two) times daily.  03/06/19  Yes [provider]  latanoprost (XALATAN) 0.005 % ophthalmic solution Place 1 drop into both eyes at bedtime.  02/11/15  Yes [provider]  loratadine (CLARITIN) 10 MG tablet Take 1 tablet (10 mg total) by mouth daily. Patient taking differently: Take 10 mg by mouth daily as needed for allergies.  12/15/14  Yes Jerrol Banana., MD  multivitamin (RENA-VIT) TABS tablet Take 1 tablet by mouth daily.  09/13/19  Yes [provider]  OXYGEN Inhale 2 L into the lungs at bedtime.   Yes [provider]  sertraline (ZOLOFT) 25 MG tablet TAKE 1 TABLET BY MOUTH EVERY DAY Patient taking differently: Take 25 mg by mouth daily.  01/17/20  Yes Jerrol Banana., MD  telmisartan (MICARDIS) 40 MG tablet Take 40 mg by mouth daily.   Yes [provider]  torsemide (DEMADEX) 100 MG tablet Take 100 mg by mouth daily.   Yes [provider]  traZODone (DESYREL) 150 MG tablet Take 150 mg by mouth at bedtime.   Yes [provider]     Review of Systems  Constitutional: Positive for fatigue (very little). Negative for appetite change.  HENT: Positive for rhinorrhea. Negative for congestion and sore throat.   Eyes: Negative.   Respiratory: Negative for cough and shortness of breath.   Cardiovascular: Positive for leg swelling. Negative for chest pain and palpitations.  Gastrointestinal: Negative for  abdominal distention and abdominal pain.  Endocrine: Negative.   Genitourinary: Negative.   Musculoskeletal: Negative for back pain and neck pain.  Skin: Negative.   Allergic/Immunologic: Negative.   Neurological: Negative for dizziness, light-headedness and headaches.  Hematological: Negative for adenopathy. Does not bruise/bleed easily.  Psychiatric/Behavioral: Negative for dysphoric mood and sleep disturbance (sleeping on 1 pillow with oxygen @ 2L). The patient is not nervous/anxious.    Vitals:   03/19/20 1048  BP: (!) 137/46  Pulse: 69  Resp: 16  SpO2: 90%  Weight: 122 lb 4 oz (55.5 kg)  Height: 5\' 2"  (1.575 m)   Wt Readings from Last 3 Encounters:  03/19/20 122 lb 4 oz (55.5 kg)  01/30/20 124 lb (56.2 kg)  01/15/20 121 lb 14.6 oz (55.3 kg)  Lab Results  Component Value Date   CREATININE 3.20 (H) 11/13/2019   CREATININE 2.79 (H) 11/12/2019   CREATININE 3.00 (H) 09/05/2019    Physical Exam Vitals and nursing note reviewed.  Constitutional:      Appearance: Normal appearance.  HENT:     Head: Normocephalic and atraumatic.  Cardiovascular:     Rate and Rhythm: Normal rate and regular rhythm.  Pulmonary:     Effort: Pulmonary effort is normal. No respiratory distress.     Breath sounds: No wheezing or rales.  Abdominal:     General: Abdomen is flat. There is no distension.     Palpations: Abdomen is soft.  Musculoskeletal:        General: No tenderness.     Cervical back: Normal range of motion and neck supple.     Right lower leg: Edema (trace pitting) present.     Left lower leg: Edema (trace pitting) present.  Skin:    General: Skin is warm and dry.  Neurological:     General: No focal deficit present.     Mental Status: She is alert and oriented to person, place, and time.  Psychiatric:        Mood and Affect: Mood normal.        Behavior: Behavior normal.    Assessment & Plan:  1: Chronic heart failure with preserved ejection fraction with  structural changes- - NYHA class II - euvolemic today - not weighing daily; encouraged her to resume so that she can call for an overnight weight gain of >2 pounds or a weekly weight gain of >5 pounds - weight down 8 pounds from last visit here 6 months ago - not adding salt to her food and tries to eat low sodium foods; reviewed the importance of closely following a 2000mg  sodium diet; using Mrs Deliah Boston for seasoning - saw cardiology Fletcher Anon) 01/30/20 - BNP 06/06/19 was 549.0 - has received both her COVID vaccines  2: HTN- - BP looks good today - saw PCP [name]) 01/14/20 - BMP from 11/13/19 reviewed and showed sodium 140, potassium 3.8, creatinine 3.2   3: DM-  - A1c 12/31/19 was 6.2% - not checking her glucose at home  4: CKD stage V- - dialysis on M & F - saw nephrology (K[place]uru) 11/30/19   5: Lymphedema- - stage 2 - limited in her ability to exercise due to being unsteady - has worn compression socks in the past but says that they "cut into her leg" - encouraged her to elevate her legs when sitting for long periods of time - saw vascular (Schn[place]) 01/02/20   Medication bottles were reviewed.   Due to HF stability, will not schedule another appointment at this time. Advised patient that she could call back at anytime to schedule another appointment or for any questions. Patient was comfortable with this plan.

## 2020-03-17 NOTE — Progress Notes (Deleted)
Established patient visit   Patient: Lori Stanley   DOB: 12-May-1931   86 y.o. Female  MRN: [id number] Visit Date: 03/17/2020  Today's healthcare provider: [name], MD   No chief complaint on file.  Subjective    HPI  Hypertension, follow-up  BP Readings from Last 3 Encounters:  02/11/20 (!) 123/94  01/30/20 (!) 136/44  01/15/20 140/66   Wt Readings from Last 3 Encounters:  01/30/20 124 lb (56.2 kg)  01/15/20 121 lb 14.6 oz (55.3 kg)  01/14/20 122 lb (55.3 kg)     She was last seen for hypertension 2 months ago.  BP at that visit was 135/46. Management since that visit includes; Decreased dose of ARB. She reports {excellent/good/fair/poor:19665} compliance with treatment. She {is/is not:9024} having side effects. {document side effects if present:1} She {is/is not:9024} exercising. She {is/is not:9024} adherent to low salt diet.   Outside blood pressures are {enter patient reported home BP, or 'not being checked':1}.  She {does/does not:200015} smoke.  Use of agents associated with hypertension: {bp agents assoc with hypertension:511::"none"}.   ---------------------------------------------------------------------------------------------------  {Show patient history (optional):23778::" "}   Medications: Outpatient Medications Prior to Visit  Medication Sig  . acetaminophen (TYLENOL) 500 MG tablet Take 500-1,000 mg by mouth every 6 (six) hours as needed for mild pain or fever.   [name] albuterol (VENTOLIN HFA) 108 (90 Base) MCG/ACT inhaler TAKE 2 PUFFS BY MOUTH EVERY 6 HOURS AS NEEDED FOR WHEEZE OR SHORTNESS OF BREATH  . amLODipine (NORVASC) 5 MG tablet TAKE 1 TABLET BY MOUTH EVERY DAY (Patient taking differently: Take 5 mg by mouth daily. )  . aspirin 81 MG chewable tablet Chew 1 tablet (81 mg total) by mouth daily.  . Bepotastine Besilate (BEPREVE) 1.5 % SOLN Place 1 drop into both eyes in the morning and at bedtime. Morning & afternoon.  .  brimonidine (ALPHAGAN) 0.2 % ophthalmic solution Place 1 drop into both eyes 2 (two) times daily.   . calcitRIOL (ROCALTROL) 0.25 MCG capsule Take 0.25 mcg by mouth every Monday, Wednesday, and Friday.   . carvedilol (COREG) 12.5 MG tablet Take 12.5 mg by mouth 2 (two) times daily with a meal.  . dorzolamide-timolol (COSOPT) 22.3-6.8 MG/ML ophthalmic solution Place 1 drop into both eyes 2 (two) times daily.   [name] latanoprost (XALATAN) 0.005 % ophthalmic solution Place 1 drop into both eyes at bedtime.   [name] loratadine (CLARITIN) 10 MG tablet Take 1 tablet (10 mg total) by mouth daily. (Patient taking differently: Take 10 mg by mouth daily as needed for allergies. )  . multivitamin (RENA-VIT) TABS tablet Take 1 tablet by mouth 2 (two) times a week. Mondays & Fridays  . OXYGEN Inhale 2 L into the lungs at bedtime.  . sertraline (ZOLOFT) 25 MG tablet TAKE 1 TABLET BY MOUTH EVERY DAY (Patient taking differently: Take 25 mg by mouth daily. )  . telmisartan (MICARDIS) 40 MG tablet Take 40 mg by mouth daily.  [name] torsemide (DEMADEX) 100 MG tablet Take 100 mg by mouth daily.  . traZODone (DESYREL) 150 MG tablet Take 150 mg by mouth at bedtime.   No facility-administered medications prior to visit.    Review of Systems  Constitutional: Negative for appetite change, chills, fatigue and fever.  Respiratory: Negative for chest tightness and shortness of breath.   Cardiovascular: Negative for chest pain and palpitations.  Gastrointestinal: Negative for abdominal pain, nausea and vomiting.  Neurological: Negative for dizziness and weakness.    {Heme  Chem  Endocrine  Serology  Results Review (optional):23779::" "}  Objective    There were no vitals taken for this visit. {Show previous vital signs (optional):23777::" "}  Physical Exam  ***  No results found for any visits on 03/17/20.  Assessment & Plan     ***  No follow-ups on file.      {provider attestation***:1}   [name],  MD  [facility] [phone] (phone) [phone] (fax)  [place]

## 2020-03-19 ENCOUNTER — Other Ambulatory Visit: Payer: Self-pay

## 2020-03-19 ENCOUNTER — Ambulatory Visit: Payer: Medicare Other | Attending: Family | Admitting: Family

## 2020-03-19 ENCOUNTER — Encounter: Payer: Self-pay | Admitting: Family

## 2020-03-19 VITALS — BP 137/46 | HR 69 | Resp 16 | Ht 62.0 in | Wt 122.2 lb

## 2020-03-19 DIAGNOSIS — J3489 Other specified disorders of nose and nasal sinuses: Secondary | ICD-10-CM | POA: Insufficient documentation

## 2020-03-19 DIAGNOSIS — Z992 Dependence on renal dialysis: Secondary | ICD-10-CM | POA: Diagnosis not present

## 2020-03-19 DIAGNOSIS — E1122 Type 2 diabetes mellitus with diabetic chronic kidney disease: Secondary | ICD-10-CM | POA: Diagnosis not present

## 2020-03-19 DIAGNOSIS — E785 Hyperlipidemia, unspecified: Secondary | ICD-10-CM | POA: Insufficient documentation

## 2020-03-19 DIAGNOSIS — Z885 Allergy status to narcotic agent status: Secondary | ICD-10-CM | POA: Insufficient documentation

## 2020-03-19 DIAGNOSIS — I890 Lymphedema, not elsewhere classified: Secondary | ICD-10-CM | POA: Insufficient documentation

## 2020-03-19 DIAGNOSIS — I132 Hypertensive heart and chronic kidney disease with heart failure and with stage 5 chronic kidney disease, or end stage renal disease: Secondary | ICD-10-CM | POA: Diagnosis not present

## 2020-03-19 DIAGNOSIS — Z87891 Personal history of nicotine dependence: Secondary | ICD-10-CM | POA: Insufficient documentation

## 2020-03-19 DIAGNOSIS — Z79899 Other long term (current) drug therapy: Secondary | ICD-10-CM | POA: Diagnosis not present

## 2020-03-19 DIAGNOSIS — F419 Anxiety disorder, unspecified: Secondary | ICD-10-CM | POA: Diagnosis not present

## 2020-03-19 DIAGNOSIS — Z8249 Family history of ischemic heart disease and other diseases of the circulatory system: Secondary | ICD-10-CM | POA: Insufficient documentation

## 2020-03-19 DIAGNOSIS — E669 Obesity, unspecified: Secondary | ICD-10-CM | POA: Insufficient documentation

## 2020-03-19 DIAGNOSIS — N186 End stage renal disease: Secondary | ICD-10-CM | POA: Insufficient documentation

## 2020-03-19 DIAGNOSIS — I10 Essential (primary) hypertension: Secondary | ICD-10-CM

## 2020-03-19 DIAGNOSIS — I5032 Chronic diastolic (congestive) heart failure: Secondary | ICD-10-CM | POA: Insufficient documentation

## 2020-03-19 DIAGNOSIS — Z7982 Long term (current) use of aspirin: Secondary | ICD-10-CM | POA: Diagnosis not present

## 2020-03-19 DIAGNOSIS — R5383 Other fatigue: Secondary | ICD-10-CM | POA: Diagnosis not present

## 2020-03-19 NOTE — Patient Instructions (Addendum)
Continue weighing daily and call for an overnight weight gain of > 2 pounds or a weekly weight gain of >5 pounds.   Call us in the future if you'd like to schedule another appointment 

## 2020-03-20 DIAGNOSIS — Z992 Dependence on renal dialysis: Secondary | ICD-10-CM | POA: Diagnosis not present

## 2020-03-20 DIAGNOSIS — N186 End stage renal disease: Secondary | ICD-10-CM | POA: Diagnosis not present

## 2020-03-23 DIAGNOSIS — Z992 Dependence on renal dialysis: Secondary | ICD-10-CM | POA: Diagnosis not present

## 2020-03-23 DIAGNOSIS — N186 End stage renal disease: Secondary | ICD-10-CM | POA: Diagnosis not present

## 2020-03-27 DIAGNOSIS — N186 End stage renal disease: Secondary | ICD-10-CM | POA: Diagnosis not present

## 2020-03-27 DIAGNOSIS — Z992 Dependence on renal dialysis: Secondary | ICD-10-CM | POA: Diagnosis not present

## 2020-03-30 DIAGNOSIS — Z992 Dependence on renal dialysis: Secondary | ICD-10-CM | POA: Diagnosis not present

## 2020-03-30 DIAGNOSIS — N186 End stage renal disease: Secondary | ICD-10-CM | POA: Diagnosis not present

## 2020-03-31 DIAGNOSIS — Z992 Dependence on renal dialysis: Secondary | ICD-10-CM | POA: Diagnosis not present

## 2020-03-31 DIAGNOSIS — N186 End stage renal disease: Secondary | ICD-10-CM | POA: Diagnosis not present

## 2020-04-03 DIAGNOSIS — N186 End stage renal disease: Secondary | ICD-10-CM | POA: Diagnosis not present

## 2020-04-03 DIAGNOSIS — Z992 Dependence on renal dialysis: Secondary | ICD-10-CM | POA: Diagnosis not present

## 2020-04-06 DIAGNOSIS — N186 End stage renal disease: Secondary | ICD-10-CM | POA: Diagnosis not present

## 2020-04-06 DIAGNOSIS — Z992 Dependence on renal dialysis: Secondary | ICD-10-CM | POA: Diagnosis not present

## 2020-04-10 DIAGNOSIS — N186 End stage renal disease: Secondary | ICD-10-CM | POA: Diagnosis not present

## 2020-04-10 DIAGNOSIS — Z992 Dependence on renal dialysis: Secondary | ICD-10-CM | POA: Diagnosis not present

## 2020-04-13 DIAGNOSIS — Z992 Dependence on renal dialysis: Secondary | ICD-10-CM | POA: Diagnosis not present

## 2020-04-13 DIAGNOSIS — N186 End stage renal disease: Secondary | ICD-10-CM | POA: Diagnosis not present

## 2020-04-13 DIAGNOSIS — N2581 Secondary hyperparathyroidism of renal origin: Secondary | ICD-10-CM | POA: Diagnosis not present

## 2020-04-14 ENCOUNTER — Telehealth: Payer: Self-pay

## 2020-04-14 DIAGNOSIS — R062 Wheezing: Secondary | ICD-10-CM | POA: Diagnosis not present

## 2020-04-14 DIAGNOSIS — J449 Chronic obstructive pulmonary disease, unspecified: Secondary | ICD-10-CM | POA: Diagnosis not present

## 2020-04-14 DIAGNOSIS — I509 Heart failure, unspecified: Secondary | ICD-10-CM | POA: Diagnosis not present

## 2020-04-14 NOTE — Telephone Encounter (Signed)
°  Chronic Care Management   Outreach Note  04/14/2020 Name: Lori Stanley MRN: [id number] DOB: 1931-08-12  Referred by: Jerrol Banana., MD Reason for referral : Chronic Case Management   An unsuccessful telephone outreach was attempted today. [name] is currently enrolled in the chronic care management program.  Phone line rang busy after multiple attempts.   Follow Up Plan:  A member of the care management team will attempt to reach [name] within the next two weeks.    [facility] Care Management [facility] [phone]

## 2020-04-16 DIAGNOSIS — I5022 Chronic systolic (congestive) heart failure: Secondary | ICD-10-CM | POA: Diagnosis not present

## 2020-04-17 DIAGNOSIS — Z992 Dependence on renal dialysis: Secondary | ICD-10-CM | POA: Diagnosis not present

## 2020-04-17 DIAGNOSIS — N186 End stage renal disease: Secondary | ICD-10-CM | POA: Diagnosis not present

## 2020-04-20 DIAGNOSIS — Z992 Dependence on renal dialysis: Secondary | ICD-10-CM | POA: Diagnosis not present

## 2020-04-20 DIAGNOSIS — N186 End stage renal disease: Secondary | ICD-10-CM | POA: Diagnosis not present

## 2020-04-24 DIAGNOSIS — N186 End stage renal disease: Secondary | ICD-10-CM | POA: Diagnosis not present

## 2020-04-24 DIAGNOSIS — Z992 Dependence on renal dialysis: Secondary | ICD-10-CM | POA: Diagnosis not present

## 2020-04-26 ENCOUNTER — Other Ambulatory Visit: Payer: Self-pay | Admitting: Family Medicine

## 2020-04-27 DIAGNOSIS — N186 End stage renal disease: Secondary | ICD-10-CM | POA: Diagnosis not present

## 2020-04-27 DIAGNOSIS — Z992 Dependence on renal dialysis: Secondary | ICD-10-CM | POA: Diagnosis not present

## 2020-05-01 DIAGNOSIS — N186 End stage renal disease: Secondary | ICD-10-CM | POA: Diagnosis not present

## 2020-05-01 DIAGNOSIS — Z992 Dependence on renal dialysis: Secondary | ICD-10-CM | POA: Diagnosis not present

## 2020-05-06 ENCOUNTER — Other Ambulatory Visit: Payer: Self-pay | Admitting: Family Medicine

## 2020-05-06 DIAGNOSIS — N186 End stage renal disease: Secondary | ICD-10-CM | POA: Diagnosis not present

## 2020-05-06 DIAGNOSIS — I10 Essential (primary) hypertension: Secondary | ICD-10-CM

## 2020-05-06 DIAGNOSIS — Z992 Dependence on renal dialysis: Secondary | ICD-10-CM | POA: Diagnosis not present

## 2020-05-06 NOTE — Telephone Encounter (Signed)
Requested medication (s) are due for refill today: Amount not specified  Requested medication (s) are on the active medication list:yes  Last refill: 06/25/19  Future visit scheduled yes 05/12/20  Notes to clinic: Historical provider  Requested Prescriptions  Pending Prescriptions Disp Refills   traZODone (DESYREL) 150 MG tablet [Pharmacy Med Name: TRAZODONE 150 MG TABLET] 90 tablet 3    Sig: TAKE 1 TABLET (150 MG TOTAL) BY MOUTH AT BEDTIME.      Psychiatry: Antidepressants - Serotonin Modulator Passed - 05/06/2020  1:41 AM      Passed - Completed PHQ-2 or PHQ-9 in the last 360 days      Passed - Valid encounter within last 6 months    Recent Outpatient Visits           3 months ago Need for immunization against influenza   [facility] Jerrol Banana., MD   4 months ago Type 2 diabetes mellitus with hyperlipidemia [facility])   [facility] Jerrol Banana., MD   6 months ago Type 2 diabetes mellitus with hyperlipidemia [facility])   [facility] Jerrol Banana., MD   9 months ago Type 2 diabetes mellitus with hyperlipidemia [facility])   [facility] Jerrol Banana., MD   10 months ago Acute on chronic diastolic CHF (congestive heart failure) [facility])   [facility] Jerrol Banana., MD       Future Appointments             In 6 days Jerrol Banana., MD [facility]              Signed Prescriptions Disp Refills   amLODipine (NORVASC) 5 MG tablet 90 tablet 0    Sig: TAKE 1 TABLET BY MOUTH EVERY DAY      Cardiovascular:  Calcium Channel Blockers Failed - 05/06/2020  1:41 AM      Failed - Last BP in normal range    BP Readings from Last 1 Encounters:  03/19/20 (!) 137/46          Passed - Valid encounter within last 6 months    Recent Outpatient Visits           3 months ago Need for immunization against influenza   [facility] Jerrol Banana., MD   4 months ago Type 2 diabetes mellitus with hyperlipidemia [facility])   [facility] Jerrol Banana., MD   6 months ago Type 2 diabetes mellitus with hyperlipidemia [facility])   [facility] Jerrol Banana., MD   9 months ago Type 2 diabetes mellitus with hyperlipidemia [facility])   [facility] Jerrol Banana., MD   10 months ago Acute on chronic diastolic CHF (congestive heart failure) [facility])   [facility] Jerrol Banana., MD       Future Appointments             In 6 days Jerrol Banana., MD [facility]

## 2020-05-08 DIAGNOSIS — N186 End stage renal disease: Secondary | ICD-10-CM | POA: Diagnosis not present

## 2020-05-08 DIAGNOSIS — Z992 Dependence on renal dialysis: Secondary | ICD-10-CM | POA: Diagnosis not present

## 2020-05-10 ENCOUNTER — Other Ambulatory Visit: Payer: Self-pay | Admitting: Family Medicine

## 2020-05-10 DIAGNOSIS — I5033 Acute on chronic diastolic (congestive) heart failure: Secondary | ICD-10-CM

## 2020-05-11 DIAGNOSIS — N2581 Secondary hyperparathyroidism of renal origin: Secondary | ICD-10-CM | POA: Diagnosis not present

## 2020-05-11 DIAGNOSIS — N186 End stage renal disease: Secondary | ICD-10-CM | POA: Diagnosis not present

## 2020-05-11 DIAGNOSIS — Z992 Dependence on renal dialysis: Secondary | ICD-10-CM | POA: Diagnosis not present

## 2020-05-12 ENCOUNTER — Ambulatory Visit: Payer: Self-pay | Admitting: Family Medicine

## 2020-05-15 DIAGNOSIS — R062 Wheezing: Secondary | ICD-10-CM | POA: Diagnosis not present

## 2020-05-15 DIAGNOSIS — Z992 Dependence on renal dialysis: Secondary | ICD-10-CM | POA: Diagnosis not present

## 2020-05-15 DIAGNOSIS — N186 End stage renal disease: Secondary | ICD-10-CM | POA: Diagnosis not present

## 2020-05-15 DIAGNOSIS — J449 Chronic obstructive pulmonary disease, unspecified: Secondary | ICD-10-CM | POA: Diagnosis not present

## 2020-05-15 DIAGNOSIS — I509 Heart failure, unspecified: Secondary | ICD-10-CM | POA: Diagnosis not present

## 2020-05-17 DIAGNOSIS — I5022 Chronic systolic (congestive) heart failure: Secondary | ICD-10-CM | POA: Diagnosis not present

## 2020-05-19 ENCOUNTER — Telehealth: Payer: Self-pay

## 2020-05-19 DIAGNOSIS — N186 End stage renal disease: Secondary | ICD-10-CM | POA: Diagnosis not present

## 2020-05-19 DIAGNOSIS — Z992 Dependence on renal dialysis: Secondary | ICD-10-CM | POA: Diagnosis not present

## 2020-05-19 NOTE — Telephone Encounter (Signed)
  Chronic Care Management   Outreach Note  05/19/2020 Name: Lori Stanley MRN: [id number] DOB: 1932/02/12  Primary Care Provider: Jerrol Banana., MD Reason for referral : Chronic Care Management  An unsuccessful telephone outreach was attempted today. Lori Stanley is currently enrolled in the chronic care management program.     Follow Up Plan:  A HIPAA compliant voice message was left today requesting a return call.     [facility] Care Management [facility] [phone]

## 2020-05-20 ENCOUNTER — Other Ambulatory Visit: Payer: Self-pay

## 2020-05-20 ENCOUNTER — Ambulatory Visit (INDEPENDENT_AMBULATORY_CARE_PROVIDER_SITE_OTHER): Payer: Medicare Other | Admitting: Family Medicine

## 2020-05-20 DIAGNOSIS — E1169 Type 2 diabetes mellitus with other specified complication: Secondary | ICD-10-CM | POA: Diagnosis not present

## 2020-05-20 DIAGNOSIS — F329 Major depressive disorder, single episode, unspecified: Secondary | ICD-10-CM | POA: Diagnosis not present

## 2020-05-20 DIAGNOSIS — I10 Essential (primary) hypertension: Secondary | ICD-10-CM | POA: Diagnosis not present

## 2020-05-20 DIAGNOSIS — E785 Hyperlipidemia, unspecified: Secondary | ICD-10-CM

## 2020-05-20 DIAGNOSIS — N186 End stage renal disease: Secondary | ICD-10-CM

## 2020-05-20 MED ORDER — AMLODIPINE BESYLATE 5 MG PO TABS: 5.0000 mg | ORAL_TABLET | Freq: Every day | ORAL | 3 refills | Status: DC

## 2020-05-20 NOTE — Progress Notes (Unsigned)
Established patient visit   Patient: Lori Stanley   DOB: 1931/07/16   87 y.o. Female  MRN: [id number] Visit Date: 05/20/2020  Today's healthcare provider: [name], MD   Chief Complaint  Patient presents with  . Diabetes  . Hypertension   Subjective    HPI  Patient feels well.  She has no complaints. Diabetes Mellitus Type II, Follow-up  Lab Results  Component Value Date   HGBA1C 6.2 (A) 12/31/2019   HGBA1C 6.0 (A) 07/23/2019   HGBA1C 6.0 (H) 04/10/2019   Wt Readings from Last 3 Encounters:  03/19/20 122 lb 4 oz (55.5 kg)  01/30/20 124 lb (56.2 kg)  01/15/20 121 lb 14.6 oz (55.3 kg)   Last seen for diabetes 5 months ago.  Management since then includes no changes. She reports excellent compliance with treatment. She is not having side effects. Symptoms: Yes fatigue No foot ulcerations  Yes appetite changes No nausea  No paresthesia of the feet  No polydipsia  No polyuria No visual disturbances   No vomiting     Home blood sugar records: not being checked  Episodes of hypoglycemia? No    Current insulin regiment: none Most Recent Eye Exam: no Current exercise: none Current diet habits: in general, a "healthy" diet    Pertinent Labs: Lab Results  Component Value Date   CHOL 233 (H) 11/05/2018   HDL 53 11/05/2018   LDLCALC 163 (H) 11/05/2018   TRIG 83 11/05/2018   CHOLHDL 4.4 11/05/2018   Lab Results  Component Value Date   NA 140 11/13/2019   K 3.8 11/13/2019   CREATININE 3.20 (H) 11/13/2019   GFRNONAA 15 (L) 11/12/2019   GFRAA 17 (L) 11/12/2019   GLUCOSE 84 11/13/2019     --------------------------------------------------------------------------------------------------- Hypertension, follow-up  BP Readings from Last 3 Encounters:  03/19/20 (!) 137/46  02/11/20 (!) 123/94  01/30/20 (!) 136/44   Wt Readings from Last 3 Encounters:  03/19/20 122 lb 4 oz (55.5 kg)  01/30/20 124 lb (56.2 kg)  01/15/20 121 lb 14.6 oz (55.3  kg)     She was last seen for hypertension 5 months ago.  BP at that visit was 136/44. Management since that visit includes no changes.  She reports excellent compliance with treatment. She is not having side effects.  She is following a Regular diet. She is not exercising. She does not smoke.  Use of agents associated with hypertension: none.   Outside blood pressures are stable. Symptoms: No chest pain No chest pressure  No palpitations No syncope  No dyspnea No orthopnea  No paroxysmal nocturnal dyspnea Yes lower extremity edema   Pertinent labs: Lab Results  Component Value Date   CHOL 233 (H) 11/05/2018   HDL 53 11/05/2018   LDLCALC 163 (H) 11/05/2018   TRIG 83 11/05/2018   CHOLHDL 4.4 11/05/2018   Lab Results  Component Value Date   NA 140 11/13/2019   K 3.8 11/13/2019   CREATININE 3.20 (H) 11/13/2019   GFRNONAA 15 (L) 11/12/2019   GFRAA 17 (L) 11/12/2019   GLUCOSE 84 11/13/2019     The ASCVD Risk score (Goff DC Jr., et al., 2013) failed to calculate for the following reasons:   The 2013 ASCVD risk score is only valid for ages 87 to 77   The patient has a prior MI or stroke diagnosis   --------------------------------------------------------------------------------------------------- Depression, Follow-up  She  was last seen for this 5 months ago. Changes made at  last visit include no changes.   She reports excellent compliance with treatment. She is not having side effects.   She reports excellent tolerance of treatment. Current symptoms include: fatigue She feels she is Improved since last visit.  Depression screen [facility] 2/9 05/20/2020 12/26/2019 05/23/2019  Decreased Interest 1 0 0  Down, Depressed, Hopeless 3 0 1  PHQ - 2 Score 4 0 1  Altered sleeping 0 - -  Tired, decreased energy 0 - -  Change in appetite 0 - -  Feeling bad or failure about yourself  0 - -  Trouble concentrating 0 - -  Moving slowly or fidgety/restless 0 - -  Suicidal thoughts  0 - -  PHQ-9 Score 4 - -  Difficult doing work/chores Not difficult at all - -    -----------------------------------------------------------------------------------------   Patient Active Problem List   Diagnosis Date Noted  . Volume overload 08/30/2019  . Anemia due to stage 4 chronic kidney disease ([place]) 08/27/2019  . ESRD (end stage renal disease) ([place])   . Acute on chronic heart failure with preserved ejection fraction (HFpEF) ([place]) 06/05/2019  . Heart failure ([place]) 06/05/2019  . Edema of both lower extremities   . Anemia in chronic kidney disease 01/23/2019  . Benign hypertensive kidney disease with chronic kidney disease 01/23/2019  . Chronic kidney disease (CKD), stage V ([place]) 01/23/2019  . Proteinuria 01/23/2019  . Secondary hyperparathyroidism of renal origin ([place]) 01/23/2019  . Respiratory failure ([place]) 04/09/2017  . Syncopal episodes 12/15/2015  . Anemia of chronic disease 04/14/2015  . Anxiety 04/14/2015  . Clinical depression 04/14/2015  . Essential hypertension 04/14/2015  . Gastro-esophageal reflux disease without esophagitis 04/14/2015  . Arthritis, degenerative 04/14/2015  . Allergic rhinitis 04/14/2015  . Type 2 diabetes mellitus with hyperlipidemia ([place]) 04/14/2015  . Hyperlipidemia 02/27/2015  . Glaucoma 12/23/2014  . Diabetes ([place]) 12/23/2014  . GERD (gastroesophageal reflux disease) 12/23/2014  . Essential hypertension, malignant 05/24/2013  . SOB (shortness of breath) 05/24/2013  . Acute on chronic diastolic CHF (congestive heart failure) ([place]) 05/24/2013   Social History   Tobacco Use  . Smoking status: Former Smoker    Types: Cigarettes  . Smokeless tobacco: Never Used  . Tobacco comment: Quit in 2015-2016  Vaping Use  . Vaping Use: Never used  Substance Use Topics  . Alcohol use: No  . Drug use: No   Allergies  Allergen Reactions  . Antihistamines, Chlorpheniramine-Type Other (See Comments)    Unknown reaction.  [name] Paxil [Paroxetine]      "makes her feel funny" not in a good way  . Codeine Rash and Other (See Comments)    Mouth sore       Medications: Outpatient Medications Prior to Visit  Medication Sig  . acetaminophen (TYLENOL) 500 MG tablet Take 500-1,000 mg by mouth every 6 (six) hours as needed for mild pain or fever.   [name] albuterol (VENTOLIN HFA) 108 (90 Base) MCG/ACT inhaler TAKE 2 PUFFS BY MOUTH EVERY 6 HOURS AS NEEDED FOR WHEEZE OR SHORTNESS OF BREATH  . amLODipine (NORVASC) 5 MG tablet TAKE 1 TABLET BY MOUTH EVERY DAY  . Bepotastine Besilate (BEPREVE) 1.5 % SOLN Place 1 drop into both eyes in the morning and at bedtime. Morning & afternoon.  . brimonidine (ALPHAGAN) 0.2 % ophthalmic solution Place 1 drop into both eyes 2 (two) times daily.   . calcitRIOL (ROCALTROL) 0.25 MCG capsule Take 0.25 mcg by mouth every Monday, Wednesday, and Friday.   . carvedilol (COREG) 12.5  MG tablet TAKE 1 TABLET (12.5 MG TOTAL) BY MOUTH 2 (TWO) TIMES DAILY.  . [employer] ASPIRIN ADULT LOW DOSE 81 MG chewable tablet CHEW 1 TABLET (81 MG TOTAL) BY MOUTH DAILY.  [name] dorzolamide-timolol (COSOPT) 22.3-6.8 MG/ML ophthalmic solution Place 1 drop into both eyes 2 (two) times daily.   [name] latanoprost (XALATAN) 0.005 % ophthalmic solution Place 1 drop into both eyes at bedtime.   [name] loratadine (CLARITIN) 10 MG tablet Take 1 tablet (10 mg total) by mouth daily. (Patient taking differently: Take 10 mg by mouth daily as needed for allergies. )  . multivitamin (RENA-VIT) TABS tablet Take 1 tablet by mouth daily.   . OXYGEN Inhale 2 L into the lungs at bedtime.  . sertraline (ZOLOFT) 25 MG tablet TAKE 1 TABLET BY MOUTH EVERY DAY (Patient taking differently: Take 25 mg by mouth daily. )  . telmisartan (MICARDIS) 40 MG tablet Take 40 mg by mouth daily.  [name] torsemide (DEMADEX) 100 MG tablet Take 100 mg by mouth daily.  . traZODone (DESYREL) 150 MG tablet TAKE 1 TABLET (150 MG TOTAL) BY MOUTH AT BEDTIME.   No facility-administered medications prior to visit.     Review of Systems  Constitutional: Positive for appetite change and fatigue. Negative for chills and fever.  Eyes: Negative for visual disturbance.  Respiratory: Negative for cough, shortness of breath and wheezing.   Cardiovascular: Positive for leg swelling. Negative for chest pain and palpitations.  Gastrointestinal: Negative for abdominal distention, nausea and vomiting.    Last CBC Lab Results  Component Value Date   WBC 5.0 11/12/2019   HGB 12.9 11/13/2019   HCT 38.0 11/13/2019   MCV 86.5 11/12/2019   MCH 26.9 11/12/2019   RDW 17.2 (H) 11/12/2019   PLT 264 11/12/2019       Objective    There were no vitals taken for this visit. BP Readings from Last 3 Encounters:  03/19/20 (!) 137/46  02/11/20 (!) 123/94  01/30/20 (!) 136/44   Wt Readings from Last 3 Encounters:  03/19/20 122 lb 4 oz (55.5 kg)  01/30/20 124 lb (56.2 kg)  01/15/20 121 lb 14.6 oz (55.3 kg)       Physical Exam Vitals reviewed.  HENT:     Head: Normocephalic and atraumatic.     Right Ear: External ear normal.     Left Ear: External ear normal.  Eyes:     General: No scleral icterus.    Conjunctiva/sclera: Conjunctivae normal.  Cardiovascular:     Rate and Rhythm: Normal rate and regular rhythm.     Heart sounds: Normal heart sounds. No friction rub. No gallop.   Pulmonary:     Effort: No respiratory distress.     Breath sounds: Normal breath sounds.  Abdominal:     Palpations: Abdomen is soft.  Musculoskeletal:     Right lower leg: Edema present.     Left lower leg: Edema present.     Comments: Chronic 1+ LE edema.  Skin:    General: Skin is warm and dry.  Neurological:     General: No focal deficit present.     Mental Status: She is alert and oriented to person, place, and time.  Psychiatric:        Mood and Affect: Mood normal.        Behavior: Behavior normal.        Thought Content: Thought content normal.        Judgment: Judgment normal.  No results found for  any visits on 05/20/20.  Assessment & Plan     1. Essential hypertension Refill amlodipine.  2. Type 2 diabetes mellitus with hyperlipidemia (HCC) Follow-up A1c in the near future.  3. Reactive depression Clinically stable.  4. Essential hypertension, malignant  - amLODipine (NORVASC) 5 MG tablet; Take 1 tablet (5 mg total) by mouth daily.  Dispense: 90 tablet; Refill: 3  5. ESRD (end stage renal disease) ([place]) On twice a week dialysis.   No follow-ups on file.      I, [name], MD, have reviewed all documentation for this visit. The documentation on 05/24/20 for the exam, diagnosis, procedures, and orders are all accurate and complete.    [place] Hankin Cranford Mon, MD  [facility] [phone] (phone) [phone] (fax)  [place]

## 2020-05-22 DIAGNOSIS — N186 End stage renal disease: Secondary | ICD-10-CM | POA: Diagnosis not present

## 2020-05-22 DIAGNOSIS — Z992 Dependence on renal dialysis: Secondary | ICD-10-CM | POA: Diagnosis not present

## 2020-05-25 ENCOUNTER — Telehealth: Payer: Self-pay

## 2020-05-25 DIAGNOSIS — D509 Iron deficiency anemia, unspecified: Secondary | ICD-10-CM | POA: Diagnosis not present

## 2020-05-25 DIAGNOSIS — Z992 Dependence on renal dialysis: Secondary | ICD-10-CM | POA: Diagnosis not present

## 2020-05-25 DIAGNOSIS — N186 End stage renal disease: Secondary | ICD-10-CM | POA: Diagnosis not present

## 2020-05-25 DIAGNOSIS — E119 Type 2 diabetes mellitus without complications: Secondary | ICD-10-CM | POA: Diagnosis not present

## 2020-05-25 NOTE — Telephone Encounter (Signed)
Copied from [place] [phone]. Topic: General - Inquiry >> May 22, 2020  3:55 PM Greggory Keen D wrote: PT called wanting to know if she is due a pneumonia vaccine... She thinks it has been a couple of years since she had one  CB#  [phone]

## 2020-05-26 NOTE — Telephone Encounter (Signed)
Patient advised she is up to date on her vaccines.

## 2020-05-26 NOTE — Progress Notes (Signed)
Subjective:   Lori Stanley is a 87 y.o. female who presents for an Initial Medicare Annual Wellness Visit.  I connected with Lori Stanley today by telephone and verified that I am speaking with the correct person using two identifiers. Location patient: home Location provider: work Persons participating in the virtual visit: patient, provider.   I discussed the limitations, risks, security and privacy concerns of performing an evaluation and management service by telephone and the availability of in person appointments. I also discussed with the patient that there may be a patient responsible charge related to this service. The patient expressed understanding and verbally consented to this telephonic visit.    Interactive audio and video telecommunications were attempted between this provider and patient, however failed, due to patient having technical difficulties OR patient did not have access to video capability.  We continued and completed visit with audio only.   Review of Systems    N/A  Cardiac Risk Factors include: advanced age (>52mn, >>41women);diabetes mellitus;hypertension;dyslipidemia     Objective:    There were no vitals filed for this visit. There is no height or weight on file to calculate BMI.  Advanced Directives 05/27/2020 11/07/2019 09/11/2019 08/31/2019 08/30/2019 08/27/2019 06/12/2019  Does Patient Have a Medical Advance Directive? Yes Yes No;Yes Yes Yes Yes Yes  Type of AParamedicof [place]airview ParkLiving will Living will;Healthcare Power of [place]ardsLiving will [place]Living will - [place]Living will -  Does patient want to make changes to medical advance directive? - No - Patient declined No - Patient declined No - Patient declined No - Patient declined - -  Copy of [place]in Chart? No - copy requested No - copy requested No - copy requested No -  copy requested - - -  Would patient like information on creating a medical advance directive? - - No - Patient declined - - - -    Current Medications (verified) Outpatient Encounter Medications as of 05/27/2020  Medication Sig  . acetaminophen (TYLENOL) 500 MG tablet Take 500-1,000 mg by mouth every 6 (six) hours as needed for mild pain or fever.   .[name] Kitchenalbuterol (VENTOLIN HFA) 108 (90 Base) MCG/ACT inhaler TAKE 2 PUFFS BY MOUTH EVERY 6 HOURS AS NEEDED FOR WHEEZE OR SHORTNESS OF BREATH  . amLODipine (NORVASC) 5 MG tablet Take 1 tablet (5 mg total) by mouth daily.  . Bepotastine Besilate 1.5 % SOLN Place 1 drop into both eyes in the morning and at bedtime. Morning & afternoon.  . brimonidine (ALPHAGAN) 0.2 % ophthalmic solution Place 1 drop into both eyes 2 (two) times daily.   . calcitRIOL (ROCALTROL) 0.25 MCG capsule Take 0.25 mcg by mouth every Monday, Wednesday, and Friday.   . carvedilol (COREG) 12.5 MG tablet TAKE 1 TABLET (12.5 MG TOTAL) BY MOUTH 2 (TWO) TIMES DAILY.  . [facility] ASPIRIN ADULT LOW DOSE 81 MG chewable tablet CHEW 1 TABLET (81 MG TOTAL) BY MOUTH DAILY.  .[name] Kitchendorzolamide-timolol (COSOPT) 22.3-6.8 MG/ML ophthalmic solution Place 1 drop into both eyes 2 (two) times daily.   .[name] Kitchenlatanoprost (XALATAN) 0.005 % ophthalmic solution Place 1 drop into both eyes at bedtime.  .[name] Kitchenloratadine (CLARITIN) 10 MG tablet Take 1 tablet (10 mg total) by mouth daily. (Patient taking differently: Take 10 mg by mouth daily as needed for allergies.)  . multivitamin (RENA-VIT) TABS tablet Take 1 tablet by mouth daily.   . OXYGEN Inhale 2 L  into the lungs at bedtime.  . sertraline (ZOLOFT) 25 MG tablet TAKE 1 TABLET BY MOUTH EVERY DAY (Patient taking differently: Take 25 mg by mouth daily.)  . telmisartan (MICARDIS) 40 MG tablet Take 40 mg by mouth daily.  [name] torsemide (DEMADEX) 100 MG tablet Take 100 mg by mouth daily.  . traZODone (DESYREL) 150 MG tablet TAKE 1 TABLET (150 MG TOTAL) BY MOUTH AT BEDTIME.   No  facility-administered encounter medications on file as of 05/27/2020.    Allergies (verified) Antihistamines, chlorpheniramine-type; Paxil [paroxetine]; and Codeine   History: Past Medical History:  Diagnosis Date  . Acute heart failure ([place])   . Anemia   . Anxiety   . CHF (congestive heart failure) ([place])   . Chronic kidney disease    stage V; end stage renal disease  . Chronic low back pain   . Depression   . Diabetes mellitus without complication ([place])   . GERD (gastroesophageal reflux disease)   . Hyperlipidemia   . Hypertension   . MI (myocardial infarction) ([place])   . Obesity    Past Surgical History:  Procedure Laterality Date  . A/V FISTULAGRAM Left 01/15/2020   Procedure: A/V FISTULAGRAM;  Surgeon: Katha Cabal, MD;  Location: [facility];  Service: Cardiovascular;  Laterality: Left;  . ABDOMINAL HYSTERECTOMY    . APPENDECTOMY    . AV FISTULA PLACEMENT Left 11/13/2019   Procedure: INSERTION OF ARTERIOVENOUS (AV) GORE-TEX GRAFT ARM ( BRACHIAL AXILLARY );  Surgeon: Katha Cabal, MD;  Location: [facility];  Service: Vascular;  Laterality: Left;  . DIALYSIS/PERMA CATHETER INSERTION N/A 06/07/2019   Procedure: DIALYSIS/PERMA CATHETER INSERTION;  Surgeon: Katha Cabal, MD;  Location: [facility];  Service: Cardiovascular;  Laterality: N/A;  . DIALYSIS/PERMA CATHETER REMOVAL N/A 02/11/2020   Procedure: DIALYSIS/PERMA CATHETER REMOVAL;  Surgeon: Katha Cabal, MD;  Location: [facility];  Service: Cardiovascular;  Laterality: N/A;  . EYE SURGERY    . HERNIA REPAIR    . INCISION AND DRAINAGE PERIRECTAL ABSCESS    . KNEE SURGERY    . THYROID SURGERY    . VAGINAL HYSTERECTOMY     Family History  Problem Relation Age of Onset  . Heart attack Mother   . Heart disease Sister   . Cancer Sister    Social History   Socioeconomic History  . Marital status: Widowed    Spouse name: Not on file  . Number of children: 1  . Years of  education: Not on file  . Highest education level: 11th grade  Occupational History  . Occupation: retired  Tobacco Use  . Smoking status: Former Smoker    Types: Cigarettes  . Smokeless tobacco: Never Used  . Tobacco comment: Quit in 2015-2016  Vaping Use  . Vaping Use: Never used  Substance and Sexual Activity  . Alcohol use: No  . Drug use: No  . Sexual activity: Never  Other Topics Concern  . Not on file  Social History Narrative   Lives in [place]; self; friends do chores; quit smoking many years; no alcohol. Worked in [employer].    Social Determinants of Health   Financial Resource Strain: Low Risk   . Difficulty of Paying Living Expenses: Not hard at all  Food Insecurity: No Food Insecurity  . Worried About Charity fundraiser in the Last Year: Never true  . Ran Out of Food in the Last Year: Never true  Transportation Needs: No Transportation  Needs  . Lack of Transportation (Medical): No  . Lack of Transportation (Non-Medical): No  Physical Activity: Inactive  . Days of Exercise per Week: 0 days  . Minutes of Exercise per Session: 0 min  Stress: No Stress Concern Present  . Feeling of Stress : Not at all  Social Connections: Socially Isolated  . Frequency of Communication with Friends and Family: More than three times a week  . Frequency of Social Gatherings with Friends and Family: Three times a week  . Attends Religious Services: Never  . Active Member of Clubs or Organizations: No  . Attends Archivist Meetings: Never  . Marital Status: Widowed    Tobacco Counseling Counseling given: Not Answered Comment: Quit in 2015-2016   Clinical Intake:  Pre-visit preparation completed: Yes  Pain : No/denies pain     Nutritional Risks: None Diabetes: Yes  How often do you need to have someone help you when you read instructions, pamphlets, or other written materials from your doctor or pharmacy?: 1 - Never  Diabetic? Yes  Nutrition Risk  Assessment:  Has the patient had any N/V/D within the last 2 months?  No  Does the patient have any non-healing wounds?  No  Has the patient had any unintentional weight loss or weight gain?  No   Diabetes:  Is the patient diabetic?  Yes  If diabetic, was a CBG obtained today?  No  Did the patient bring in their glucometer from home?  No  How often do you monitor your CBG's? Once or twice a month.   Financial Strains and Diabetes Management:  Are you having any financial strains with the device, your supplies or your medication? No .  Does the patient want to be seen by Chronic Care Management for management of their diabetes?  No  Would the patient like to be referred to a Nutritionist or for Diabetic Management?  No   Diabetic Exams:  Diabetic Eye Exam: Overdue for diabetic eye exam. Pt has been advised about the importance in completing this exam. Patient advised to call and schedule an eye exam. Diabetic Foot Exam: Done 01/14/20.   Interpreter Needed?: No  Information entered by :: [facility], LPN   Activities of Daily Living In your present state of health, do you have any difficulty performing the following activities: 05/27/2020 05/20/2020  Hearing? N N  Comment - -  Vision? N N  Difficulty concentrating or making decisions? N N  Comment - -  Walking or climbing stairs? Y N  Comment Due to knee pains. -  Dressing or bathing? N N  Doing errands, shopping? Y N  Comment Does not drive. -  Preparing Food and eating ? N -  Using the Toilet? N -  In the past six months, have you accidently leaked urine? N -  Do you have problems with loss of bowel control? N -  Managing your Medications? N -  Managing your Finances? N -  Housekeeping or managing your Housekeeping? N -  Some recent data might be hidden    Patient Care Team: Jerrol Banana., MD as PCP - General (Family Medicine) Wellington Hampshire, MD as PCP - Cardiology (Cardiology) Sharlotte Alamo, DPM  (Podiatry) Hayden Pedro, MD as Consulting Physician (Ophthalmology) Lavonia Dana, MD as Consulting Physician (Internal Medicine) Odette Fraction (Optometry) Cammie Sickle, MD as Consulting Physician (Internal Medicine) Neldon Labella, RN as Case Manager  Indicate any recent Medical Services you may have received  from other than [facility] providers in the past year (date may be approximate).     Assessment:   This is a routine wellness examination for [place].  Hearing/Vision screen No exam data present  Dietary issues and exercise activities discussed: Current Exercise Habits: The patient does not participate in regular exercise at present, Exercise limited by: orthopedic condition(s)  Goals      Patient Stated   .  Expensive eye drops (pt-stated)      Current Barriers:  . financial  Pharmacist Clinical Goal(s): Over the next 14 days, Ms.Oswaldo Milian will provide the necessary supplementary documents (proof of out of pocket prescription expenditure, proof of household income) needed for medication assistance applications to CCM pharmacist.   Interventions: . CCM pharmacist will investigate available programs or foundations for Ms. Pester's eye drops  Patient Self Care Activities:  [name] Gather necessary documents needed to apply for medication assistance  Initial goal documentation      .  Pill Burden (pt-stated)       Current Barriers:  . High pill burden . Limited vision abilities  Pharmacist Clinical Goal(s): Over the next 30 days, Ms. Trebilcock will be adherent to all medications as evidenced by patient report. CCM clinical pharmacist will collaborate with prescribers to reduce medications if medically appropriate.   Interventions: . Patient educated on purpose, proper use and potential adverse effects of medications. Minette Brine Dr. Delton Prairie re: Ergocalciferol and labs . Develop plan with Dr. [name] for coming off of insulin . DC ASA '81mg'$   . Make  sure patient can read and visualize medications appropriately  Patient Self Care Activities:  . Continue to take all medications as prescribed  Initial goal documentation        Other   .  Chronic Care Management      CARE PLAN ENTRY (see longitudinal plan of care for additional care plan information)  Current Barriers:  . Chronic Disease Management support and education needs related HTN, CHF, DM, HLD and ESRD.  Case Manager Clinical Goal(s):  Over the next 120 days, patient will: . Not require hospitalization or emergent care d/t complications r/t chronic illnesses. . Take all medications as prescribed. . Attend all medical appointments as scheduled. . Monitor blood pressure and record readings. . Follow recommended safety measures to prevent fall and injuries. Over the next 45 days, patient will: . Schedule routine eye exam. . Schedule routine podiatry visit.    Interventions:  . Inter-disciplinary care team collaboration (see longitudinal plan of care) . Discussed recent BP readings. Unable to recall exact readings. Reports occasional low readings following dialysis but says most readings have been within range.  . Reviewed s/sx of CHF related complications and discussed current activity tolerance. She continues to use supplemental oxygen at 2L/min during the night. Denies episodes of shortness of breath during the day. Denies chest discomfort, abdominal or lower extremity edema. Denies change/decline in activity tolerance.  . Discussed plan for care management follow-up. Ms. Duddy reports doing well and remains independent in the home. She required a new arteriovenous graft. The procedure was performed on 11/13/19. Reports episodes of shoulder pain but denies unrelieved pain or worsening symptoms that require immediate medical follow-up. Reports wearing her arm sleeve as advised. We thoroughly discussed her nutrition and importance of protein intake. She feels that she is  doing well with maintaining a renal diet. She does recall days when her appetite is very limited d/t fatigue following dialysis. We discussed possible need to supplement with Nepro  on these days vice skipping afternoon and evening meals. She agreed to discuss with her Nephrologist. Currently feels that she is doing well and denies urgent concerns or changes with care management needs.    Patient Self Care Activities:  . Self administers medications  . Attends scheduled provider appointments . Calls pharmacy for medication refills . Performs ADL's independently . Performs IADL's independently   Please see past updates related to this goal by clicking on the "Past Updates" button in the selected goal      .  DIET - INCREASE WATER INTAKE      Recommend to drink at least 6-8 8oz glasses of water per day.      Depression Screen PHQ 2/9 Scores 05/20/2020 12/26/2019 05/23/2019 05/23/2019 04/10/2019 03/11/2019 05/15/2018  PHQ - 2 Score 4 0 1 1 0 2 1  PHQ- 9 Score 4 - - - 0 7 -    Fall Risk Fall Risk  05/27/2020 05/20/2020 03/19/2020 09/17/2019 07/16/2019  Falls in the past year? 0 0 0 0 1  Number falls in past yr: 0 0 0 0 0  Injury with Fall? 0 0 0 0 -  Risk for fall due to : - No Fall Risks - Impaired balance/gait -  Follow up - Falls evaluation completed Falls evaluation completed Falls evaluation completed Falls evaluation completed    [place]:  Any stairs in or around the home? No  If so, are there any without handrails? No  Home free of loose throw rugs in walkways, pet beds, electrical cords, etc? Yes  Adequate lighting in your home to reduce risk of falls? Yes   ASSISTIVE DEVICES UTILIZED TO PREVENT FALLS:  Life alert? No  Use of a cane, walker or w/c? Yes  Grab bars in the bathroom? Yes  Shower chair or bench in shower? Yes  Elevated toilet seat or a handicapped toilet? Yes    Cognitive Function: Normal cognitive status assessed by observation  by this Nurse Health Advisor. No abnormalities found.       6CIT Screen 05/23/2019 05/15/2018  What Year? 0 points 0 points  What month? 0 points 0 points  What time? 0 points 0 points  Count back from 20 0 points 0 points  Months in reverse 4 points 2 points  Repeat phrase 6 points 4 points  Total Score 10 6    Immunizations Immunization History  Administered Date(s) Administered  . Fluad Quad(high Dose 65+) 01/29/2019, 01/14/2020  . Hepatitis B, adult 12/27/2019, 03/30/2020, 04/03/2020  . Influenza, High Dose Seasonal PF 02/05/2015, 01/16/2016, 12/26/2016, 02/13/2018  . PFIZER(Purple Top)SARS-COV-2 Vaccination 08/07/2019, 08/28/2019, 05/20/2020  . PPD Test 11/29/2019  . Pneumococcal Conjugate-13 02/04/2014  . Pneumococcal Polysaccharide-23 04/07/2002  . Td 10/19/2016    TDAP status: Up to date  Flu Vaccine status: Up to date  Pneumococcal vaccine status: Up to date  Covid-19 vaccine status: Completed vaccines  Qualifies for Shingles Vaccine? Yes   Zostavax completed No   Shingrix Completed?: No.    Education has been provided regarding the importance of this vaccine. Patient has been advised to call insurance company to determine out of pocket expense if they have not yet received this vaccine. Advised may also receive vaccine at local pharmacy or Health Dept. Verbalized acceptance and understanding.  Screening Tests Health Maintenance  Topic Date Due  . OPHTHALMOLOGY EXAM  10/04/2018  . HEMOGLOBIN A1C  06/29/2020  . FOOT EXAM  01/13/2021  . TETANUS/TDAP  10/20/2026  .  INFLUENZA VACCINE  Completed  . DEXA SCAN  Completed  . COVID-19 Vaccine  Completed  . PNA vac Low Risk Adult  Completed    Health Maintenance  Health Maintenance Due  Topic Date Due  . OPHTHALMOLOGY EXAM  10/04/2018    Colorectal cancer screening: No longer required.   Mammogram status: No longer required due to age.  Bone Density status: Completed 12/11/03. Results reflect: Previous DEXA  scan was normal. No repeat needed unless advised by a physician.  Lung Cancer Screening: (Low Dose CT Chest recommended if Age 47-80 years, 30 pack-year currently smoking OR have quit w/in 15years.) does not qualify.   Additional Screening:  Vision Screening: Recommended annual ophthalmology exams for early detection of glaucoma and other disorders of the eye. Is the patient up to date with their annual eye exam?  Yes  Who is the provider or what is the name of the office in which the patient attends annual eye exams? Dr Gwynn Burly If pt is not established with a provider, would they like to be referred to a provider to establish care? No .   Dental Screening: Recommended annual dental exams for proper oral hygiene  Community Resource Referral / Chronic Care Management: CRR required this visit?  No   CCM required this visit?  No      Plan:     I have personally reviewed and noted the following in the patient's chart:   . Medical and social history . Use of alcohol, tobacco or illicit drugs  . Current medications and supplements . Functional ability and status . Nutritional status . Physical activity . Advanced directives . List of other physicians . Hospitalizations, surgeries, and ER visits in previous 12 months . Vitals . Screenings to include cognitive, depression, and falls . Referrals and appointments  In addition, I have reviewed and discussed with patient certain preventive protocols, quality metrics, and best practice recommendations. A written personalized care plan for preventive services as well as general preventive health recommendations were provided to patient.     Merial Moritz [place], [place]   [phone]   Nurse Notes: Pt plans to call and schedule an eye exam with Dr Gwynn Burly this year.

## 2020-05-27 ENCOUNTER — Ambulatory Visit (INDEPENDENT_AMBULATORY_CARE_PROVIDER_SITE_OTHER): Payer: Medicare Other

## 2020-05-27 ENCOUNTER — Other Ambulatory Visit: Payer: Self-pay

## 2020-05-27 DIAGNOSIS — Z0000 Encounter for general adult medical examination without abnormal findings: Secondary | ICD-10-CM

## 2020-05-27 NOTE — Patient Instructions (Signed)
Lori Stanley , Thank you for taking time to come for your Medicare Wellness Visit. I appreciate your ongoing commitment to your health goals. Please review the following plan we discussed and let me know if I can assist you in the future.   Screening recommendations/referrals: Colonoscopy: No longer required.  Mammogram: No longer required.  Bone Density: Previous DEXA scan was normal. No repeat needed unless advised by a physician. Recommended yearly ophthalmology/optometry visit for glaucoma screening and checkup Recommended yearly dental visit for hygiene and checkup  Vaccinations: Influenza vaccine: Done 01/14/20 Pneumococcal vaccine: Completed series Tdap vaccine: Up to date, due 10/2026 Shingles vaccine: Shingrix discussed. Please contact your pharmacy for coverage information.     Advanced directives: Please bring a copy of your POA (Power of Attorney) and/or Living Will to your next appointment.   Conditions/risks identified: Recommend to drink at least 6-8 8oz glasses of water per day.  Next appointment: 09/15/20 @ 10:40 AM with Dr [name]    Preventive Care 85 Years and Older, Female Preventive care refers to lifestyle choices and visits with your health care provider that can promote health and wellness. What does preventive care include?  A yearly physical exam. This is also called an annual well check.  Dental exams once or twice a year.  Routine eye exams. Ask your health care provider how often you should have your eyes checked.  Personal lifestyle choices, including:  Daily care of your teeth and gums.  Regular physical activity.  Eating a healthy diet.  Avoiding tobacco and drug use.  Limiting alcohol use.  Practicing safe sex.  Taking low-dose aspirin every day.  Taking vitamin and mineral supplements as recommended by your health care provider. What happens during an annual well check? The services and screenings done by your health care provider  during your annual well check will depend on your age, overall health, lifestyle risk factors, and family history of disease. Counseling  Your health care provider may ask you questions about your:  Alcohol use.  Tobacco use.  Drug use.  Emotional well-being.  Home and relationship well-being.  Sexual activity.  Eating habits.  History of falls.  Memory and ability to understand (cognition).  Work and work Statistician.  Reproductive health. Screening  You may have the following tests or measurements:  Height, weight, and BMI.  Blood pressure.  Lipid and cholesterol levels. These may be checked every 5 years, or more frequently if you are over 68 years old.  Skin check.  Lung cancer screening. You may have this screening every year starting at age 51 if you have a 30-pack-year history of smoking and currently smoke or have quit within the past 15 years.  Fecal occult blood test (FOBT) of the stool. You may have this test every year starting at age 43.  Flexible sigmoidoscopy or colonoscopy. You may have a sigmoidoscopy every 5 years or a colonoscopy every 10 years starting at age 60.  Hepatitis C blood test.  Hepatitis B blood test.  Sexually transmitted disease (STD) testing.  Diabetes screening. This is done by checking your blood sugar (glucose) after you have not eaten for a while (fasting). You may have this done every 1-3 years.  Bone density scan. This is done to screen for osteoporosis. You may have this done starting at age 35.  Mammogram. This may be done every 1-2 years. Talk to your health care provider about how often you should have regular mammograms. Talk with your health care provider about  your test results, treatment options, and if necessary, the need for more tests. Vaccines  Your health care provider may recommend certain vaccines, such as:  Influenza vaccine. This is recommended every year.  Tetanus, diphtheria, and acellular pertussis  (Tdap, Td) vaccine. You may need a Td booster every 10 years.  Zoster vaccine. You may need this after age 59.  Pneumococcal 13-valent conjugate (PCV13) vaccine. One dose is recommended after age 31.  Pneumococcal polysaccharide (PPSV23) vaccine. One dose is recommended after age 52. Talk to your health care provider about which screenings and vaccines you need and how often you need them. This information is not intended to replace advice given to you by your health care provider. Make sure you discuss any questions you have with your health care provider. Document Released: 05/15/2015 Document Revised: 01/06/2016 Document Reviewed: 02/17/2015 Elsevier Interactive Patient Education  2017 [place] Prevention in the Home Falls can cause injuries. They can happen to people of all ages. There are many things you can do to make your home safe and to help prevent falls. What can I do on the outside of my home?  Regularly fix the edges of walkways and driveways and fix any cracks.  Remove anything that might make you trip as you walk through a door, such as a raised step or threshold.  Trim any bushes or trees on the path to your home.  Use bright outdoor lighting.  Clear any walking paths of anything that might make someone trip, such as rocks or tools.  Regularly check to see if handrails are loose or broken. Make sure that both sides of any steps have handrails.  Any raised decks and porches should have guardrails on the edges.  Have any leaves, snow, or ice cleared regularly.  Use sand or salt on walking paths during winter.  Clean up any spills in your garage right away. This includes oil or grease spills. What can I do in the bathroom?  Use night lights.  Install grab bars by the toilet and in the tub and shower. Do not use towel bars as grab bars.  Use non-skid mats or decals in the tub or shower.  If you need to sit down in the shower, use a plastic, non-slip  stool.  Keep the floor dry. Clean up any water that spills on the floor as soon as it happens.  Remove soap buildup in the tub or shower regularly.  Attach bath mats securely with double-sided non-slip rug tape.  Do not have throw rugs and other things on the floor that can make you trip. What can I do in the bedroom?  Use night lights.  Make sure that you have a light by your bed that is easy to reach.  Do not use any sheets or blankets that are too big for your bed. They should not hang down onto the floor.  Have a firm chair that has side arms. You can use this for support while you get dressed.  Do not have throw rugs and other things on the floor that can make you trip. What can I do in the kitchen?  Clean up any spills right away.  Avoid walking on wet floors.  Keep items that you use a lot in easy-to-reach places.  If you need to reach something above you, use a strong step stool that has a grab bar.  Keep electrical cords out of the way.  Do not use floor polish or wax  that makes floors slippery. If you must use wax, use non-skid floor wax.  Do not have throw rugs and other things on the floor that can make you trip. What can I do with my stairs?  Do not leave any items on the stairs.  Make sure that there are handrails on both sides of the stairs and use them. Fix handrails that are broken or loose. Make sure that handrails are as long as the stairways.  Check any carpeting to make sure that it is firmly attached to the stairs. Fix any carpet that is loose or worn.  Avoid having throw rugs at the top or bottom of the stairs. If you do have throw rugs, attach them to the floor with carpet tape.  Make sure that you have a light switch at the top of the stairs and the bottom of the stairs. If you do not have them, ask someone to add them for you. What else can I do to help prevent falls?  Wear shoes that:  Do not have high heels.  Have rubber bottoms.  Are  comfortable and fit you well.  Are closed at the toe. Do not wear sandals.  If you use a stepladder:  Make sure that it is fully opened. Do not climb a closed stepladder.  Make sure that both sides of the stepladder are locked into place.  Ask someone to hold it for you, if possible.  Clearly mark and make sure that you can see:  Any grab bars or handrails.  First and last steps.  Where the edge of each step is.  Use tools that help you move around (mobility aids) if they are needed. These include:  Canes.  Walkers.  Scooters.  Crutches.  Turn on the lights when you go into a dark area. Replace any light bulbs as soon as they burn out.  Set up your furniture so you have a clear path. Avoid moving your furniture around.  If any of your floors are uneven, fix them.  If there are any pets around you, be aware of where they are.  Review your medicines with your doctor. Some medicines can make you feel dizzy. This can increase your chance of falling. Ask your doctor what other things that you can do to help prevent falls. This information is not intended to replace advice given to you by your health care provider. Make sure you discuss any questions you have with your health care provider. Document Released: 02/12/2009 Document Revised: 09/24/2015 Document Reviewed: 05/23/2014 Elsevier Interactive Patient Education  2017 [employer].

## 2020-05-29 DIAGNOSIS — N186 End stage renal disease: Secondary | ICD-10-CM | POA: Diagnosis not present

## 2020-05-29 DIAGNOSIS — Z992 Dependence on renal dialysis: Secondary | ICD-10-CM | POA: Diagnosis not present

## 2020-06-01 DIAGNOSIS — N186 End stage renal disease: Secondary | ICD-10-CM | POA: Diagnosis not present

## 2020-06-01 DIAGNOSIS — Z992 Dependence on renal dialysis: Secondary | ICD-10-CM | POA: Diagnosis not present

## 2020-06-03 ENCOUNTER — Other Ambulatory Visit: Payer: Self-pay | Admitting: Family Medicine

## 2020-06-05 DIAGNOSIS — N186 End stage renal disease: Secondary | ICD-10-CM | POA: Diagnosis not present

## 2020-06-05 DIAGNOSIS — Z992 Dependence on renal dialysis: Secondary | ICD-10-CM | POA: Diagnosis not present

## 2020-06-10 DIAGNOSIS — N186 End stage renal disease: Secondary | ICD-10-CM | POA: Diagnosis not present

## 2020-06-10 DIAGNOSIS — B351 Tinea unguium: Secondary | ICD-10-CM | POA: Diagnosis not present

## 2020-06-10 DIAGNOSIS — Z992 Dependence on renal dialysis: Secondary | ICD-10-CM | POA: Diagnosis not present

## 2020-06-10 DIAGNOSIS — E119 Type 2 diabetes mellitus without complications: Secondary | ICD-10-CM | POA: Diagnosis not present

## 2020-06-12 DIAGNOSIS — Z992 Dependence on renal dialysis: Secondary | ICD-10-CM | POA: Diagnosis not present

## 2020-06-12 DIAGNOSIS — N186 End stage renal disease: Secondary | ICD-10-CM | POA: Diagnosis not present

## 2020-06-15 DIAGNOSIS — Z992 Dependence on renal dialysis: Secondary | ICD-10-CM | POA: Diagnosis not present

## 2020-06-15 DIAGNOSIS — R062 Wheezing: Secondary | ICD-10-CM | POA: Diagnosis not present

## 2020-06-15 DIAGNOSIS — I509 Heart failure, unspecified: Secondary | ICD-10-CM | POA: Diagnosis not present

## 2020-06-15 DIAGNOSIS — J449 Chronic obstructive pulmonary disease, unspecified: Secondary | ICD-10-CM | POA: Diagnosis not present

## 2020-06-15 DIAGNOSIS — N186 End stage renal disease: Secondary | ICD-10-CM | POA: Diagnosis not present

## 2020-06-17 DIAGNOSIS — I5022 Chronic systolic (congestive) heart failure: Secondary | ICD-10-CM | POA: Diagnosis not present

## 2020-06-19 DIAGNOSIS — N186 End stage renal disease: Secondary | ICD-10-CM | POA: Diagnosis not present

## 2020-06-19 DIAGNOSIS — Z992 Dependence on renal dialysis: Secondary | ICD-10-CM | POA: Diagnosis not present

## 2020-06-22 DIAGNOSIS — N186 End stage renal disease: Secondary | ICD-10-CM | POA: Diagnosis not present

## 2020-06-22 DIAGNOSIS — Z992 Dependence on renal dialysis: Secondary | ICD-10-CM | POA: Diagnosis not present

## 2020-06-26 DIAGNOSIS — N186 End stage renal disease: Secondary | ICD-10-CM | POA: Diagnosis not present

## 2020-06-26 DIAGNOSIS — Z992 Dependence on renal dialysis: Secondary | ICD-10-CM | POA: Diagnosis not present

## 2020-06-29 DIAGNOSIS — N186 End stage renal disease: Secondary | ICD-10-CM | POA: Diagnosis not present

## 2020-06-29 DIAGNOSIS — Z992 Dependence on renal dialysis: Secondary | ICD-10-CM | POA: Diagnosis not present

## 2020-07-03 DIAGNOSIS — N186 End stage renal disease: Secondary | ICD-10-CM | POA: Diagnosis not present

## 2020-07-03 DIAGNOSIS — Z992 Dependence on renal dialysis: Secondary | ICD-10-CM | POA: Diagnosis not present

## 2020-07-06 DIAGNOSIS — N186 End stage renal disease: Secondary | ICD-10-CM | POA: Diagnosis not present

## 2020-07-06 DIAGNOSIS — Z992 Dependence on renal dialysis: Secondary | ICD-10-CM | POA: Diagnosis not present

## 2020-07-11 ENCOUNTER — Encounter: Payer: Self-pay | Admitting: Emergency Medicine

## 2020-07-11 ENCOUNTER — Other Ambulatory Visit: Payer: Self-pay

## 2020-07-11 ENCOUNTER — Inpatient Hospital Stay
Admission: EM | Admit: 2020-07-11 | Discharge: 2020-07-14 | DRG: 350 | Disposition: A | Discharge status: Home Health | Payer: Medicare Other | Attending: Internal Medicine | Admitting: Internal Medicine

## 2020-07-11 ENCOUNTER — Emergency Department: Payer: Medicare Other

## 2020-07-11 DIAGNOSIS — K4030 Unilateral inguinal hernia, with obstruction, without gangrene, not specified as recurrent: Secondary | ICD-10-CM | POA: Diagnosis present

## 2020-07-11 DIAGNOSIS — Z992 Dependence on renal dialysis: Secondary | ICD-10-CM | POA: Diagnosis not present

## 2020-07-11 DIAGNOSIS — Z9049 Acquired absence of other specified parts of digestive tract: Secondary | ICD-10-CM

## 2020-07-11 DIAGNOSIS — K4130 Unilateral femoral hernia, with obstruction, without gangrene, not specified as recurrent: Principal | ICD-10-CM | POA: Diagnosis present

## 2020-07-11 DIAGNOSIS — Z8249 Family history of ischemic heart disease and other diseases of the circulatory system: Secondary | ICD-10-CM

## 2020-07-11 DIAGNOSIS — I509 Heart failure, unspecified: Secondary | ICD-10-CM | POA: Diagnosis not present

## 2020-07-11 DIAGNOSIS — I132 Hypertensive heart and chronic kidney disease with heart failure and with stage 5 chronic kidney disease, or end stage renal disease: Secondary | ICD-10-CM | POA: Diagnosis present

## 2020-07-11 DIAGNOSIS — N2581 Secondary hyperparathyroidism of renal origin: Secondary | ICD-10-CM | POA: Diagnosis not present

## 2020-07-11 DIAGNOSIS — Z7982 Long term (current) use of aspirin: Secondary | ICD-10-CM

## 2020-07-11 DIAGNOSIS — Z9981 Dependence on supplemental oxygen: Secondary | ICD-10-CM

## 2020-07-11 DIAGNOSIS — K6389 Other specified diseases of intestine: Secondary | ICD-10-CM | POA: Diagnosis not present

## 2020-07-11 DIAGNOSIS — F32A Depression, unspecified: Secondary | ICD-10-CM | POA: Diagnosis present

## 2020-07-11 DIAGNOSIS — Z4682 Encounter for fitting and adjustment of non-vascular catheter: Secondary | ICD-10-CM | POA: Diagnosis not present

## 2020-07-11 DIAGNOSIS — I5033 Acute on chronic diastolic (congestive) heart failure: Secondary | ICD-10-CM | POA: Diagnosis not present

## 2020-07-11 DIAGNOSIS — D631 Anemia in chronic kidney disease: Secondary | ICD-10-CM | POA: Diagnosis present

## 2020-07-11 DIAGNOSIS — K8020 Calculus of gallbladder without cholecystitis without obstruction: Secondary | ICD-10-CM | POA: Diagnosis not present

## 2020-07-11 DIAGNOSIS — E1165 Type 2 diabetes mellitus with hyperglycemia: Secondary | ICD-10-CM | POA: Diagnosis present

## 2020-07-11 DIAGNOSIS — D72829 Elevated white blood cell count, unspecified: Secondary | ICD-10-CM | POA: Diagnosis present

## 2020-07-11 DIAGNOSIS — I5032 Chronic diastolic (congestive) heart failure: Secondary | ICD-10-CM | POA: Diagnosis not present

## 2020-07-11 DIAGNOSIS — N186 End stage renal disease: Secondary | ICD-10-CM | POA: Diagnosis present

## 2020-07-11 DIAGNOSIS — F419 Anxiety disorder, unspecified: Secondary | ICD-10-CM | POA: Diagnosis present

## 2020-07-11 DIAGNOSIS — Z79899 Other long term (current) drug therapy: Secondary | ICD-10-CM | POA: Diagnosis not present

## 2020-07-11 DIAGNOSIS — R188 Other ascites: Secondary | ICD-10-CM | POA: Diagnosis not present

## 2020-07-11 DIAGNOSIS — E785 Hyperlipidemia, unspecified: Secondary | ICD-10-CM | POA: Diagnosis present

## 2020-07-11 DIAGNOSIS — I10 Essential (primary) hypertension: Secondary | ICD-10-CM | POA: Diagnosis present

## 2020-07-11 DIAGNOSIS — Z20822 Contact with and (suspected) exposure to covid-19: Secondary | ICD-10-CM | POA: Diagnosis present

## 2020-07-11 DIAGNOSIS — K4090 Unilateral inguinal hernia, without obstruction or gangrene, not specified as recurrent: Secondary | ICD-10-CM | POA: Diagnosis not present

## 2020-07-11 DIAGNOSIS — E1122 Type 2 diabetes mellitus with diabetic chronic kidney disease: Secondary | ICD-10-CM | POA: Diagnosis present

## 2020-07-11 DIAGNOSIS — K5730 Diverticulosis of large intestine without perforation or abscess without bleeding: Secondary | ICD-10-CM | POA: Diagnosis not present

## 2020-07-11 DIAGNOSIS — Z87891 Personal history of nicotine dependence: Secondary | ICD-10-CM

## 2020-07-11 DIAGNOSIS — I252 Old myocardial infarction: Secondary | ICD-10-CM

## 2020-07-11 DIAGNOSIS — J449 Chronic obstructive pulmonary disease, unspecified: Secondary | ICD-10-CM | POA: Diagnosis not present

## 2020-07-11 DIAGNOSIS — E119 Type 2 diabetes mellitus without complications: Secondary | ICD-10-CM

## 2020-07-11 DIAGNOSIS — K56609 Unspecified intestinal obstruction, unspecified as to partial versus complete obstruction: Secondary | ICD-10-CM | POA: Diagnosis present

## 2020-07-11 DIAGNOSIS — R062 Wheezing: Secondary | ICD-10-CM | POA: Diagnosis not present

## 2020-07-11 LAB — CBC
HCT: 39.9 % (ref 36.0–46.0)
Hemoglobin: 12.7 g/dL (ref 12.0–15.0)
MCH: 28.3 pg (ref 26.0–34.0)
MCHC: 31.8 g/dL (ref 30.0–36.0)
MCV: 88.9 fL (ref 80.0–100.0)
Platelets: 285 10*3/uL (ref 150–400)
RBC: 4.49 MIL/uL (ref 3.87–5.11)
RDW: 14.1 % (ref 11.5–15.5)
WBC: 12.8 10*3/uL — ABNORMAL HIGH (ref 4.0–10.5)
nRBC: 0 % (ref 0.0–0.2)

## 2020-07-11 LAB — COMPREHENSIVE METABOLIC PANEL
ALT: 17 U/L (ref 0–44)
AST: 23 U/L (ref 15–41)
Albumin: 4.2 g/dL (ref 3.5–5.0)
Alkaline Phosphatase: 92 U/L (ref 38–126)
Anion gap: 11 (ref 5–15)
BUN: 50 mg/dL — ABNORMAL HIGH (ref 8–23)
CO2: 25 mmol/L (ref 22–32)
Calcium: 9.9 mg/dL (ref 8.9–10.3)
Chloride: 99 mmol/L (ref 98–111)
Creatinine, Ser: 4.12 mg/dL — ABNORMAL HIGH (ref 0.44–1.00)
GFR, Estimated: 10 mL/min — ABNORMAL LOW (ref >60)
Glucose, Bld: 207 mg/dL — ABNORMAL HIGH (ref 70–99)
Potassium: 4.7 mmol/L (ref 3.5–5.1)
Sodium: 135 mmol/L (ref 135–145)
Total Bilirubin: 0.9 mg/dL (ref 0.3–1.2)
Total Protein: 7.5 g/dL (ref 6.5–8.1)

## 2020-07-11 LAB — RESP PANEL BY RT-PCR (FLU A&B, COVID) ARPGX2
Influenza A by PCR: NEGATIVE
Influenza B by PCR: NEGATIVE
SARS Coronavirus 2 by RT PCR: NEGATIVE

## 2020-07-11 LAB — LIPASE, BLOOD: Lipase: 26 U/L (ref 11–51)

## 2020-07-11 MED ORDER — BUPIVACAINE HCL (PF) 0.25 % IJ SOLN
INTRAMUSCULAR | Status: AC
  Filled 2020-07-11: qty 30

## 2020-07-11 MED ORDER — SODIUM CHLORIDE 0.9 % IV SOLN: Freq: Once | INTRAVENOUS | Status: DC

## 2020-07-11 MED ORDER — PIPERACILLIN-TAZOBACTAM IN DEX 2-0.25 GM/50ML IV SOLN
2.2500 g | Freq: Three times a day (TID) | INTRAVENOUS | Status: DC
  Administered 2020-07-11 – 2020-07-14 (×7): 2.25 g via INTRAVENOUS
  Filled 2020-07-11 (×13): qty 50

## 2020-07-11 MED ORDER — SODIUM CHLORIDE 0.9 % IV SOLN: INTRAVENOUS | Status: AC

## 2020-07-11 MED ORDER — BUPIVACAINE LIPOSOME 1.3 % IJ SUSP
INTRAMUSCULAR | Status: AC
  Filled 2020-07-11: qty 20

## 2020-07-11 MED ORDER — FENTANYL CITRATE (PF) 100 MCG/2ML IJ SOLN
INTRAMUSCULAR | Status: AC
  Filled 2020-07-11: qty 2

## 2020-07-11 MED ORDER — PROPOFOL 10 MG/ML IV BOLUS
INTRAVENOUS | Status: AC
  Filled 2020-07-11: qty 20

## 2020-07-11 MED ORDER — FENTANYL CITRATE (PF) 100 MCG/2ML IJ SOLN: 12.5000 ug | INTRAMUSCULAR | Status: DC | PRN

## 2020-07-11 MED ORDER — INSULIN ASPART 100 UNIT/ML ~~LOC~~ SOLN
0.0000 [IU] | Freq: Three times a day (TID) | SUBCUTANEOUS | Status: DC
  Administered 2020-07-12 – 2020-07-13 (×2): 1 [IU] via SUBCUTANEOUS
  Filled 2020-07-11 (×2): qty 1

## 2020-07-11 MED ORDER — FENTANYL CITRATE (PF) 100 MCG/2ML IJ SOLN
12.5000 ug | Freq: Once | INTRAMUSCULAR | Status: AC
  Administered 2020-07-11: 12.5 ug via INTRAVENOUS
  Filled 2020-07-11: qty 2

## 2020-07-11 MED ORDER — ONDANSETRON HCL 4 MG/2ML IJ SOLN
4.0000 mg | Freq: Once | INTRAMUSCULAR | Status: AC
  Administered 2020-07-11: 4 mg via INTRAVENOUS
  Filled 2020-07-11: qty 2

## 2020-07-11 MED ORDER — ONDANSETRON HCL 4 MG/2ML IJ SOLN: 4.0000 mg | Freq: Four times a day (QID) | INTRAMUSCULAR | Status: DC | PRN

## 2020-07-11 NOTE — Consult Note (Signed)
Date of Consultation:  07/11/2020  Requesting Physician:  Duffy Bruce, MD; Betha Loa, PA-C  Reason for Consultation:  Small bowel obstruction with left inguinal hernia  History of Present Illness: Lori Stanley is a 87 y.o. female presenting with a 2-3 day history of nausea and vomiting associated with low abdominal pain.  She had been doing well before that, but then on 07/09/20, started having these symptoms.  She did not feel well on 3/11 and missed her Friday round of dialysis.  Due to no improvement, she presented today to the ER.  The pain has remained in the low abdomen, denies any radiation.  Reports some distention.  In the ER, labs show mild elevation of WBC to 12.8, K of 4.7, normal LFTs.  CT scan shows distended loops of small bowel with transition point at the patient's left inguinal hernia which contains a loop of small bowel.  There is a bit of inflammatory changes surrounding it.  NG tube was placed and manual reduction was attempted by the ER providers, but were not able to reduce.  Past Medical History: Past Medical History:  Diagnosis Date  . Acute heart failure ([place])   . Anemia   . Anxiety   . CHF (congestive heart failure) ([place])   . Chronic kidney disease    stage V; end stage renal disease  . Chronic low back pain   . Depression   . Diabetes mellitus without complication ([place])   . GERD (gastroesophageal reflux disease)   . Hyperlipidemia   . Hypertension   . MI (myocardial infarction) ([place])   . Obesity      Past Surgical History: Past Surgical History:  Procedure Laterality Date  . A/V FISTULAGRAM Left 01/15/2020   Procedure: A/V FISTULAGRAM;  Surgeon: Katha Cabal, MD;  Location: [facility] [facility] [facility];  Service: Cardiovascular;  Laterality: Left;  . ABDOMINAL HYSTERECTOMY    . APPENDECTOMY    . AV FISTULA PLACEMENT Left 11/13/2019   Procedure: INSERTION OF ARTERIOVENOUS (AV) GORE-TEX GRAFT ARM ( BRACHIAL AXILLARY );  Surgeon: Katha Cabal, MD;  Location: [facility];  Service: Vascular;  Laterality: Left;  . DIALYSIS/PERMA CATHETER INSERTION N/A 06/07/2019   Procedure: DIALYSIS/PERMA CATHETER INSERTION;  Surgeon: Katha Cabal, MD;  Location: [facility];  Service: Cardiovascular;  Laterality: N/A;  . DIALYSIS/PERMA CATHETER REMOVAL N/A 02/11/2020   Procedure: DIALYSIS/PERMA CATHETER REMOVAL;  Surgeon: Katha Cabal, MD;  Location: [facility];  Service: Cardiovascular;  Laterality: N/A;  . EYE SURGERY    . HERNIA REPAIR    . INCISION AND DRAINAGE PERIRECTAL ABSCESS    . KNEE SURGERY    . THYROID SURGERY    . VAGINAL HYSTERECTOMY      Home Medications: Prior to Admission medications   Medication Sig Start Date End Date Taking? Authorizing Provider  acetaminophen (TYLENOL) 500 MG tablet Take 500-1,000 mg by mouth every 6 (six) hours as needed for mild pain or fever.     [provider]  albuterol (VENTOLIN HFA) 108 (90 Base) MCG/ACT inhaler TAKE 2 PUFFS BY MOUTH EVERY 6 HOURS AS NEEDED FOR WHEEZE OR SHORTNESS OF BREATH 10/05/19   Jerrol Banana., MD  amLODipine (NORVASC) 5 MG tablet Take 1 tablet (5 mg total) by mouth daily. 05/20/20   Jerrol Banana., MD  Bepotastine Besilate 1.5 % SOLN Place 1 drop into both eyes in the morning and at bedtime. Morning & afternoon.    [provider]  brimonidine (ALPHAGAN) 0.2 % ophthalmic solution Place 1 drop into both eyes 2 (two) times daily.     [provider]  calcitRIOL (ROCALTROL) 0.25 MCG capsule Take 0.25 mcg by mouth every Monday, Wednesday, and Friday.  10/23/18   [provider]  carvedilol (COREG) 12.5 MG tablet TAKE 1 TABLET (12.5 MG TOTAL) BY MOUTH 2 (TWO) TIMES DAILY. 04/26/20   Jerrol Banana., MD  [facility] ASPIRIN ADULT LOW DOSE 81 MG chewable tablet CHEW 1 TABLET (81 MG TOTAL) BY MOUTH DAILY. 05/10/20   Jerrol Banana., MD  dorzolamide-timolol (COSOPT) 22.3-6.8 MG/ML ophthalmic solution  Place 1 drop into both eyes 2 (two) times daily.  03/06/19   [provider]  latanoprost (XALATAN) 0.005 % ophthalmic solution Place 1 drop into both eyes at bedtime. 02/11/15   [provider]  loratadine (CLARITIN) 10 MG tablet Take 1 tablet (10 mg total) by mouth daily. Patient taking differently: Take 10 mg by mouth daily as needed for allergies. 12/15/14   Jerrol Banana., MD  multivitamin (RENA-VIT) TABS tablet Take 1 tablet by mouth daily.  09/13/19   [provider]  OXYGEN Inhale 2 L into the lungs at bedtime.    [provider]  sertraline (ZOLOFT) 25 MG tablet TAKE 1 TABLET BY MOUTH EVERY DAY Patient taking differently: Take 25 mg by mouth daily. 01/17/20   Jerrol Banana., MD  telmisartan (MICARDIS) 40 MG tablet Take 40 mg by mouth daily.    [provider]  torsemide (DEMADEX) 100 MG tablet Take 100 mg by mouth daily.    [provider]  traZODone (DESYREL) 150 MG tablet TAKE 1 TABLET (150 MG TOTAL) BY MOUTH AT BEDTIME. 05/07/20   Jerrol Banana., MD    Allergies: Allergies  Allergen Reactions  . Antihistamines, Chlorpheniramine-Type Other (See Comments)    Unknown reaction.  [name] Paxil [Paroxetine]     "makes her feel funny" not in a good way  . Codeine Rash and Other (See Comments)    Mouth sore    Social History:  reports that she has quit smoking. Her smoking use included cigarettes. She has never used smokeless tobacco. She reports that she does not drink alcohol and does not use drugs.   Family History: Family History  Problem Relation Age of Onset  . Heart attack Mother   . Heart disease Sister   . Cancer Sister     Review of Systems: Review of Systems  Constitutional: Positive for malaise/fatigue. Negative for chills and fever.  HENT: Negative for hearing loss.   Respiratory: Negative for shortness of breath.   Cardiovascular: Negative for chest pain.  Gastrointestinal: Positive for  abdominal pain, nausea and vomiting. Negative for blood in stool, constipation and diarrhea.  Genitourinary: Negative for dysuria.  Musculoskeletal: Negative for myalgias.  Neurological: Negative for dizziness.  Psychiatric/Behavioral: Negative for depression.    Physical Exam BP (!) 168/63   Pulse 66   Temp 98.5 F (36.9 C) (Oral)   Resp 18   Ht '5\' 3"'$  (1.6 m)   Wt 56.7 kg   SpO2 93%   BMI 22.14 kg/m  CONSTITUTIONAL: No acute distress HEENT:  Normocephalic, atraumatic, extraocular motion intact. NECK: Trachea is midline, and there is no jugular venous distension. RESPIRATORY:  Normal respiratory effort without pathologic use of accessory muscles. CARDIOVASCULAR:  Regular rhythm and rate. GI: The abdomen is soft, mildly distended, with tenderness to palpation in the left  groin. The patient has a hard, incarcerated left inguinal hernia.  There is no overlying erythema, but I am unable to reduce the hernia. There were no palpable masses.  NG tube in place, and was advanced to 65 cm at the nose. Patient has a well healed low midline incision from prior hysterectomy.  MUSCULOSKELETAL:  Normal muscle strength and tone in all four extremities.  No peripheral edema or cyanosis. SKIN: Skin turgor is normal. There are no pathologic skin lesions.  NEUROLOGIC:  Motor and sensation is grossly normal.  Cranial nerves are grossly intact. PSYCH:  Alert and oriented to person, place and time. Affect is normal.  Laboratory Analysis: Results for orders placed or performed during the hospital encounter of 07/11/20 (from the past 24 hour(s))  Lipase, blood     Status: None   Collection Time: 07/11/20  4:36 PM  Result Value Ref Range   Lipase 26 11 - 51 U/L  Comprehensive metabolic panel     Status: Abnormal   Collection Time: 07/11/20  4:36 PM  Result Value Ref Range   Sodium 135 135 - 145 mmol/L   Potassium 4.7 3.5 - 5.1 mmol/L   Chloride 99 98 - 111 mmol/L   CO2 25 22 - 32 mmol/L   Glucose,  Bld 207 (H) 70 - 99 mg/dL   BUN 50 (H) 8 - 23 mg/dL   Creatinine, Ser 4.12 (H) 0.44 - 1.00 mg/dL   Calcium 9.9 8.9 - 10.3 mg/dL   Total Protein 7.5 6.5 - 8.1 g/dL   Albumin 4.2 3.5 - 5.0 g/dL   AST 23 15 - 41 U/L   ALT 17 0 - 44 U/L   Alkaline Phosphatase 92 38 - 126 U/L   Total Bilirubin 0.9 0.3 - 1.2 mg/dL   GFR, Estimated 10 (L) >60 mL/min   Anion gap 11 5 - 15  CBC     Status: Abnormal   Collection Time: 07/11/20  4:36 PM  Result Value Ref Range   WBC 12.8 (H) 4.0 - 10.5 K/uL   RBC 4.49 3.87 - 5.11 MIL/uL   Hemoglobin 12.7 12.0 - 15.0 g/dL   HCT 39.9 36.0 - 46.0 %   MCV 88.9 80.0 - 100.0 fL   MCH 28.3 26.0 - 34.0 pg   MCHC 31.8 30.0 - 36.0 g/dL   RDW 14.1 11.5 - 15.5 %   Platelets 285 150 - 400 K/uL   nRBC 0.0 0.0 - 0.2 %    Imaging: CT ABDOMEN PELVIS WO CONTRAST  Result Date: 07/11/2020 CLINICAL DATA:  Left lower quadrant abdominal pain EXAM: CT ABDOMEN AND PELVIS WITHOUT CONTRAST TECHNIQUE: Multidetector CT imaging of the abdomen and pelvis was performed following the standard protocol without IV contrast. COMPARISON:  None. FINDINGS: Lower chest: Cardiomegaly. Extensive 3 vessel coronary artery calcifications and/or stents. Small pericardial effusion. Hepatobiliary: No solid liver abnormality is seen. Calcified gallstones in the gallbladder. Gallbladder wall thickening, or biliary dilatation. Pancreas: Unremarkable. No pancreatic ductal dilatation or surrounding inflammatory changes. Spleen: Normal in size without significant abnormality. Adrenals/Urinary Tract: Adrenal glands are unremarkable. Kidneys are normal, without renal calculi, solid lesion, or hydronephrosis. Bladder is unremarkable. Stomach/Bowel: Stomach is within normal limits. There are numerous distended loops of small bowel in the mid abdomen, measuring up to 3.5 cm in caliber. There is an abrupt caliber change of the bowel distal to a left-sided inguinal hernia containing a single knuckled loop of ileum (series  2, image 73). Descending and sigmoid diverticulosis. Vascular/Lymphatic: Aortic atherosclerosis.  No enlarged abdominal or pelvic lymph nodes. Reproductive: Status post hysterectomy. Other: Left-sided inguinal hernia containing obstructed appearing with bowel as detailed above. Small volume ascites throughout the abdomen and pelvis. Musculoskeletal: No acute or significant osseous findings. IMPRESSION: 1. There are numerous distended loops of small bowel in the mid abdomen, measuring up to 3.5 cm in caliber. There is an abrupt caliber change of the bowel distal to a left-sided inguinal hernia containing a single knuckled loop of ileum. Findings are consistent with small bowel obstruction. 2. Small volume ascites throughout the abdomen and pelvis, likely reactive. 3. Cholelithiasis without evidence of acute cholecystitis. 4. Cardiomegaly and coronary artery disease. 5. Small pericardial effusion. Aortic Atherosclerosis (ICD10-I70.0). Electronically Signed   By: Eddie Candle M.D.   On: 07/11/2020 18:34   DG Abdomen 1 View  Result Date: 07/11/2020 CLINICAL DATA:  Enteric catheter placement EXAM: ABDOMEN - 1 VIEW COMPARISON:  06/06/2019 FINDINGS: Frontal view of the lower chest and upper abdomen demonstrates enteric catheter tip and side port projecting over the distal thoracic esophagus. Recommend advancing at least 7.5 cm to ensure side port placement within the gastric lumen. Chronic elevation of the right hemidiaphragm. Diffuse gaseous distention of the small bowel concerning for obstruction. IMPRESSION: 1. Enteric catheter as above, tip and side port projecting over distal thoracic esophagus. Recommend advancing at least 7.5 cm to ensure side port placement within the gastric lumen. 2. Small bowel obstruction. Electronically Signed   By: Randa Ngo M.D.   On: 07/11/2020 20:52    Assessment and Plan: This is a 87 y.o. female with an incarcerated left inguinal hernia containing small bowel, resulting in  small bowel obstruction.  --Despite of multiple attempts by ED providers and myself, unable to reduce the patient's hernia.  There was not significant tenderness when trying to reduce the hernia, but it does feel hard.  Discussed with the patient that given unsuccessful attempts to reduce it, will have to take her to the OR tonight for left inguinal hernia repair, possible small bowel resection, possible laparotomy.  Discussed with her the risks of bleeding, infection, injury to surrounding structures, and she's willing to proceed.  Will continue NPO with NG tube to suction after surgery until return of bowel function.  Will start IV Zosyn as a precaution given the small bowel incarceration.  Given all her medical comorbidities, I also think it's best to admit to medicine.  Will need consult with Nephrology for dialysis. --Plan for OR tonight pending OR availability.  Face-to-face time spent with the patient and care providers was 80 minutes, with more than 50% of the time spent counseling, educating, and coordinating care of the patient.     Melvyn Neth, MD [facility]g:  [phone]

## 2020-07-11 NOTE — ED Notes (Signed)
Pt placed on oxygen at 2lpm for pox of 90% after fentanyl administration. Rebound pox up to 94% with oxygen.

## 2020-07-11 NOTE — ED Notes (Signed)
Report to [place], rn with OR.

## 2020-07-11 NOTE — ED Triage Notes (Signed)
Pt to ER with c/o n/v since yesterday. Pt also reports abdominal pain.  Pt states was unable to go to dialysis yesterday due to being sick.

## 2020-07-11 NOTE — ED Notes (Signed)
Hat placed in toilet, pt educated on specimen collection. Pt and daughter verbalize understanding.

## 2020-07-11 NOTE — Progress Notes (Signed)
Pharmacy Antibiotic Note  Lori Stanley is a 87 y.o. female with ESRD on HD admitted on 07/11/2020 with intraabdominal infection w/ incarcerated left inguinal hernia containing small bowel.  Pharmacy has been consulted for Zosyn dosing.  Plan: Ordered Zosyn 2.25 gm q8h per indication and abx dosing guidelines for HD pts.   Pharmacy will continue to follow   Height: '5\' 3"'$  (160 cm) Weight: 56.7 kg (125 lb) IBW/kg (Calculated) : 52.4  Temp (24hrs), Avg:98.5 F (36.9 C), Min:98.5 F (36.9 C), Max:98.5 F (36.9 C)  Recent Labs  Lab 07/11/20 1636  WBC 12.8*  CREATININE 4.12*    Estimated Creatinine Clearance: 8.1 mL/min (A) (by C-G formula based on SCr of 4.12 mg/dL (H)).    Allergies  Allergen Reactions  . Antihistamines, Chlorpheniramine-Type Other (See Comments)    Unknown reaction.  [name] Paxil [Paroxetine]     "makes her feel funny" not in a good way  . Codeine Rash and Other (See Comments)    Mouth sore    Antimicrobials this admission: 03/12 Zosyn >>   Microbiology results: No Lab Cx currently ordered at this time.  Thank you for allowing pharmacy to be a part of this patient's care.  Renda Rolls, PharmD, [facility] 07/11/2020 11:40 PM

## 2020-07-11 NOTE — ED Notes (Signed)
Surgeon in to see pt. Surgeon adjusting ng tube length.

## 2020-07-11 NOTE — ED Notes (Signed)
Waiting on zosyn from pharmacy

## 2020-07-11 NOTE — ED Notes (Signed)
hospitalist in to see pt.

## 2020-07-11 NOTE — ED Notes (Signed)
Xray with pt 

## 2020-07-11 NOTE — H&P (Signed)
History and Physical    Lori Stanley [id number] DOB: 03-09-1932 DOA: 07/11/2020  PCP: Jerrol Banana., MD  Patient coming from: Home, sister at bedside  I have personally briefly reviewed patient's old medical records in [place]  Chief Complaint: abdominal pain, N/V/D  HPI: Lori Stanley is a 87 y.o. female with medical history significant for ESRD on HD (MW), chronic diastolic heart failure, diet-controlled type 2 diabetes, hypertension, and secondary hyperparathyroidism who presents with concerns of persistent nausea, vomiting and diarrhea.  About 3 days ago patient was taking out the trash and noted sudden onset left-sided abdominal pain that was severe and persistent.  She then also started developing nausea, vomiting and diarrhea.  Abdominal pain is resolved today but continues to have vomiting and diarrhea.  Missed her dialysis on Friday since she was feeling sick.  Denies any fever.  She has history of appendectomy, umbilical hernia repair and total hysterectomy.  In the ER, she was afebrile and hypertensive up to 160 due to pain.  CBC shows leukocytosis of 12.8.  Creatinine of 4.12 from a baseline of around 3.  CT of the abdomen and pelvis showed left-sided inguinal hernia incarceration.  Multiple attempts of manual reduction was unsuccessful by ED physician Dr. Ellender Hose and general surgeon Dr.Piscoya.  Plan for OR tonight.  Currently n.p.o. with NG tube to suction.  Hospitalist to admit for medical management of other comorbidities.   Review of Systems: Constitutional: No Weight Change, No Fever ENT/Mouth: No sore throat, No Rhinorrhea Eyes: No Eye Pain, No Vision Changes Cardiovascular: No Chest Pain, no SOB Respiratory: No Cough, No Sputum,  Gastrointestinal: + Nausea, + Vomiting, + Diarrhea, No Constipation, + Pain Genitourinary: no Urinary Incontinence Musculoskeletal: No Arthralgias, No Myalgias Skin: No Skin Lesions, No Pruritus, Neuro: no  Weakness, No Numbness Psych: No Anxiety/Panic, No Depression, + decrease appetite Heme/Lymph: No Bruising, No Bleeding  Past Medical History:  Diagnosis Date   Acute heart failure (HCC)    Anemia    Anxiety    CHF (congestive heart failure) (HCC)    Chronic kidney disease    stage V; end stage renal disease   Chronic low back pain    Depression    Diabetes mellitus without complication (HCC)    GERD (gastroesophageal reflux disease)    Hyperlipidemia    Hypertension    MI (myocardial infarction) ([place])    Obesity     Past Surgical History:  Procedure Laterality Date   A/V FISTULAGRAM Left 01/15/2020   Procedure: A/V FISTULAGRAM;  Surgeon: Katha Cabal, MD;  Location: [facility];  Service: Cardiovascular;  Laterality: Left;   ABDOMINAL HYSTERECTOMY     APPENDECTOMY     AV FISTULA PLACEMENT Left 11/13/2019   Procedure: INSERTION OF ARTERIOVENOUS (AV) GORE-TEX GRAFT ARM ( BRACHIAL AXILLARY );  Surgeon: Katha Cabal, MD;  Location: [facility];  Service: Vascular;  Laterality: Left;   DIALYSIS/PERMA CATHETER INSERTION N/A 06/07/2019   Procedure: DIALYSIS/PERMA CATHETER INSERTION;  Surgeon: Katha Cabal, MD;  Location: [facility];  Service: Cardiovascular;  Laterality: N/A;   DIALYSIS/PERMA CATHETER REMOVAL N/A 02/11/2020   Procedure: DIALYSIS/PERMA CATHETER REMOVAL;  Surgeon: Katha Cabal, MD;  Location: [facility];  Service: Cardiovascular;  Laterality: N/A;   EYE SURGERY     HERNIA REPAIR     INCISION AND DRAINAGE PERIRECTAL ABSCESS     KNEE SURGERY     THYROID SURGERY  VAGINAL HYSTERECTOMY       reports that she has quit smoking. Her smoking use included cigarettes. She has never used smokeless tobacco. She reports that she does not drink alcohol and does not use drugs. Social History  Allergies  Allergen Reactions   Antihistamines, Chlorpheniramine-Type Other (See Comments)    Unknown reaction.    Paxil [Paroxetine]     "makes her feel funny" not in a good way   Codeine Rash and Other (See Comments)    Mouth sore    Family History  Problem Relation Age of Onset   Heart attack Mother    Heart disease Sister    Cancer Sister      Prior to Admission medications   Medication Sig Start Date End Date Taking? Authorizing Provider  acetaminophen (TYLENOL) 500 MG tablet Take 500-1,000 mg by mouth every 6 (six) hours as needed for mild pain or fever.     [provider]  albuterol (VENTOLIN HFA) 108 (90 Base) MCG/ACT inhaler TAKE 2 PUFFS BY MOUTH EVERY 6 HOURS AS NEEDED FOR WHEEZE OR SHORTNESS OF BREATH 10/05/19   Jerrol Banana., MD  amLODipine (NORVASC) 5 MG tablet Take 1 tablet (5 mg total) by mouth daily. 05/20/20   Jerrol Banana., MD  Bepotastine Besilate 1.5 % SOLN Place 1 drop into both eyes in the morning and at bedtime. Morning & afternoon.    [provider]  brimonidine (ALPHAGAN) 0.2 % ophthalmic solution Place 1 drop into both eyes 2 (two) times daily.     [provider]  calcitRIOL (ROCALTROL) 0.25 MCG capsule Take 0.25 mcg by mouth every Monday, Wednesday, and Friday.  10/23/18   [provider]  carvedilol (COREG) 12.5 MG tablet TAKE 1 TABLET (12.5 MG TOTAL) BY MOUTH 2 (TWO) TIMES DAILY. 04/26/20   Jerrol Banana., MD  [facility] ASPIRIN ADULT LOW DOSE 81 MG chewable tablet CHEW 1 TABLET (81 MG TOTAL) BY MOUTH DAILY. 05/10/20   Jerrol Banana., MD  dorzolamide-timolol (COSOPT) 22.3-6.8 MG/ML ophthalmic solution Place 1 drop into both eyes 2 (two) times daily.  03/06/19   [provider]  latanoprost (XALATAN) 0.005 % ophthalmic solution Place 1 drop into both eyes at bedtime. 02/11/15   [provider]  loratadine (CLARITIN) 10 MG tablet Take 1 tablet (10 mg total) by mouth daily. Patient taking differently: Take 10 mg by mouth daily as needed for allergies. 12/15/14   Jerrol Banana., MD   multivitamin (RENA-VIT) TABS tablet Take 1 tablet by mouth daily.  09/13/19   [provider]  OXYGEN Inhale 2 L into the lungs at bedtime.    [provider]  sertraline (ZOLOFT) 25 MG tablet TAKE 1 TABLET BY MOUTH EVERY DAY Patient taking differently: Take 25 mg by mouth daily. 01/17/20   Jerrol Banana., MD  telmisartan (MICARDIS) 40 MG tablet Take 40 mg by mouth daily.    [provider]  torsemide (DEMADEX) 100 MG tablet Take 100 mg by mouth daily.    [provider]  traZODone (DESYREL) 150 MG tablet TAKE 1 TABLET (150 MG TOTAL) BY MOUTH AT BEDTIME. 05/07/20   Jerrol Banana., MD    Physical Exam: Vitals:   07/11/20 1900 07/11/20 2000 07/11/20 2050 07/11/20 2100  BP: (!) 166/67 (!) 167/62 (!) 165/67 (!) 168/63  Pulse: 63 63 73 66  Resp: '17 17 16 18  '$ Temp:      TempSrc:  SpO2: 97% 98% 93% 93%  Weight:      Height:        Constitutional: NAD, ill appearing drowsy elderly thin female Vitals:   07/11/20 1900 07/11/20 2000 07/11/20 2050 07/11/20 2100  BP: (!) 166/67 (!) 167/62 (!) 165/67 (!) 168/63  Pulse: 63 63 73 66  Resp: '17 17 16 18  '$ Temp:      TempSrc:      SpO2: 97% 98% 93% 93%  Weight:      Height:       Eyes: PERRL, lids and conjunctivae normal ENMT: Mucous membranes are dry. Neck: normal, supple Respiratory: clear to auscultation bilaterally, no wheezing, no crackles. Normal respiratory effort. No accessory muscle use.  Cardiovascular: Regular rate and rhythm, no murmurs / rubs / gallops. No extremity edema.  Abdomen: LLQ pain with hernia noted. No guarding or rigidity. Minimal Bowel sounds Musculoskeletal: no clubbing / cyanosis. No joint deformity upper and lower extremities.  Normal muscle tone.  Skin: no rashes, lesions, ulcers. No induration Neurologic: CN 2-12 grossly intact. Sensation intact,  Strength 5/5 in all 4.  Psychiatric: Normal judgment and insight. Alert and oriented x 3. Normal mood.      Labs on Admission: I have personally reviewed following labs and imaging studies  CBC: Recent Labs  Lab 07/11/20 1636  WBC 12.8*  HGB 12.7  HCT 39.9  MCV 88.9  PLT [id number]   Basic Metabolic Panel: Recent Labs  Lab 07/11/20 1636  NA 135  K 4.7  CL 99  CO2 25  GLUCOSE 207*  BUN 50*  CREATININE 4.12*  CALCIUM 9.9   GFR: Estimated Creatinine Clearance: 8.1 mL/min (A) (by C-G formula based on SCr of 4.12 mg/dL (H)). Liver Function Tests: Recent Labs  Lab 07/11/20 1636  AST 23  ALT 17  ALKPHOS 92  BILITOT 0.9  PROT 7.5  ALBUMIN 4.2   Recent Labs  Lab 07/11/20 1636  LIPASE 26   No results for input(s): AMMONIA in the last 168 hours. Coagulation Profile: No results for input(s): INR, PROTIME in the last 168 hours. Cardiac Enzymes: No results for input(s): CKTOTAL, CKMB, CKMBINDEX, TROPONINI in the last 168 hours. BNP (last 3 results) No results for input(s): PROBNP in the last 8760 hours. HbA1C: No results for input(s): HGBA1C in the last 72 hours. CBG: No results for input(s): GLUCAP in the last 168 hours. Lipid Profile: No results for input(s): CHOL, HDL, LDLCALC, TRIG, CHOLHDL, LDLDIRECT in the last 72 hours. Thyroid Function Tests: No results for input(s): TSH, T4TOTAL, FREET4, T3FREE, THYROIDAB in the last 72 hours. Anemia Panel: No results for input(s): VITAMINB12, FOLATE, FERRITIN, TIBC, IRON, RETICCTPCT in the last 72 hours. Urine analysis:    Component Value Date/Time   COLORURINE Yellow 05/11/2013 2255   APPEARANCEUR Clear 05/11/2013 2255   LABSPEC 1.010 05/11/2013 2255   PHURINE 7.0 05/11/2013 2255   GLUCOSEU >=500 05/11/2013 2255   HGBUR 1+ 05/11/2013 2255   BILIRUBINUR Negative 05/11/2013 2255   KETONESUR Negative 05/11/2013 2255   PROTEINUR >=500 05/11/2013 2255   NITRITE Negative 05/11/2013 2255   LEUKOCYTESUR Negative 05/11/2013 2255    Radiological Exams on Admission: CT ABDOMEN PELVIS WO CONTRAST  Result Date:  07/11/2020 CLINICAL DATA:  Left lower quadrant abdominal pain EXAM: CT ABDOMEN AND PELVIS WITHOUT CONTRAST TECHNIQUE: Multidetector CT imaging of the abdomen and pelvis was performed following the standard protocol without IV contrast. COMPARISON:  None. FINDINGS: Lower chest: Cardiomegaly. Extensive 3 vessel coronary artery calcifications and/or stents.  Small pericardial effusion. Hepatobiliary: No solid liver abnormality is seen. Calcified gallstones in the gallbladder. Gallbladder wall thickening, or biliary dilatation. Pancreas: Unremarkable. No pancreatic ductal dilatation or surrounding inflammatory changes. Spleen: Normal in size without significant abnormality. Adrenals/Urinary Tract: Adrenal glands are unremarkable. Kidneys are normal, without renal calculi, solid lesion, or hydronephrosis. Bladder is unremarkable. Stomach/Bowel: Stomach is within normal limits. There are numerous distended loops of small bowel in the mid abdomen, measuring up to 3.5 cm in caliber. There is an abrupt caliber change of the bowel distal to a left-sided inguinal hernia containing a single knuckled loop of ileum (series 2, image 73). Descending and sigmoid diverticulosis. Vascular/Lymphatic: Aortic atherosclerosis. No enlarged abdominal or pelvic lymph nodes. Reproductive: Status post hysterectomy. Other: Left-sided inguinal hernia containing obstructed appearing with bowel as detailed above. Small volume ascites throughout the abdomen and pelvis. Musculoskeletal: No acute or significant osseous findings. IMPRESSION: 1. There are numerous distended loops of small bowel in the mid abdomen, measuring up to 3.5 cm in caliber. There is an abrupt caliber change of the bowel distal to a left-sided inguinal hernia containing a single knuckled loop of ileum. Findings are consistent with small bowel obstruction. 2. Small volume ascites throughout the abdomen and pelvis, likely reactive. 3. Cholelithiasis without evidence of acute  cholecystitis. 4. Cardiomegaly and coronary artery disease. 5. Small pericardial effusion. Aortic Atherosclerosis (ICD10-I70.0). Electronically Signed   By: Eddie Candle M.D.   On: 07/11/2020 18:34   DG Abdomen 1 View  Result Date: 07/11/2020 CLINICAL DATA:  Enteric catheter placement EXAM: ABDOMEN - 1 VIEW COMPARISON:  06/06/2019 FINDINGS: Frontal view of the lower chest and upper abdomen demonstrates enteric catheter tip and side port projecting over the distal thoracic esophagus. Recommend advancing at least 7.5 cm to ensure side port placement within the gastric lumen. Chronic elevation of the right hemidiaphragm. Diffuse gaseous distention of the small bowel concerning for obstruction. IMPRESSION: 1. Enteric catheter as above, tip and side port projecting over distal thoracic esophagus. Recommend advancing at least 7.5 cm to ensure side port placement within the gastric lumen. 2. Small bowel obstruction. Electronically Signed   By: Randa Ngo M.D.   On: 07/11/2020 20:52      Assessment/Plan  Incarcerated left inguinal hernia Successful attempt to reduce at bedside by ED physician and by general surgery Patient to be taken the OR tonight by general surgeon Dr.Piscoya. Will be kept NPO on NG tube after surgery until clinically improved IV Zosyn for prophylaxis given small bowel incarceration per surgery PRN Zofran  PRN Fentanyl q3hr for severe pain  ESRD on HD MW Missed dialysis Friday No other electrolyte abnormality No urgent dialysis needs Need to consult nephrology  Diet-controlled type 2 diabetes with hyperglycemia Check hemoglobin A1c Place on very sensitive sliding scale  Chronic diastolic heart failure Appears hypovolemic on exam.  Patient will receive continuous IV fluids until taken the OR.  Hypertension Elevated secondary to pain Resume antihypertensives when able to be back on p.o.  DVT prophylaxis: SCD Code Status: Full Family Communication: Plan discussed  with patient and sister at bedside  disposition Plan: Home with at least 2 midnight stays  Consults called:  Admission status: inpatient  Level of care: Stepdown  Status is: Inpatient  Remains inpatient appropriate because:Inpatient level of care appropriate due to severity of illness   Dispo: The patient is from: Home              Anticipated d/c is to: Home  Patient currently is not medically stable to d/c.   Difficult to place patient No         Orene Desanctis DO [facility]   If 7PM-7AM, please contact night-coverage [email or web address]   07/11/2020, 11:07 PM

## 2020-07-11 NOTE — ED Notes (Signed)
Iv team here 

## 2020-07-11 NOTE — ED Notes (Signed)
Pt changed out of clothing and placed into gown. Pt informed iv team is coming to place iv. Pt with AVF to left upper arm.

## 2020-07-11 NOTE — ED Provider Notes (Signed)
[facility] Emergency Department Provider Note  ____________________________________________  Time seen: Approximately 5:52 PM  I have reviewed the triage vital signs and the nursing notes.   HISTORY  Chief Complaint Emesis and Abdominal Pain    HPI Lori Stanley is a 87 y.o. female who presents emergency department complaining of malaise, nausea, 1 episode of vomiting, diarrhea.  Patient states that symptoms have been ongoing for 2 to 3 days.  Patient is seen no amount emesis or hematochezia.  Patient is on dialysis, missed dialysis yesterday to not feeling "well."  Patient has had no reported fevers, nasal congestion, sore throat, cough.  Patient has had some intermittent abdominal pain, best described as left lower quadrant currently.  Patient denies any recent sick contacts.  History of CHF, anemia, end-stage renal disease on dialysis, diabetes, GERD, hypertension.   Patient still urinates even though she is on dialysis.  She denies any changes in urination.        Past Medical History:  Diagnosis Date  . Acute heart failure ([place])   . Anemia   . Anxiety   . CHF (congestive heart failure) ([place])   . Chronic kidney disease    stage V; end stage renal disease  . Chronic low back pain   . Depression   . Diabetes mellitus without complication ([place])   . GERD (gastroesophageal reflux disease)   . Hyperlipidemia   . Hypertension   . MI (myocardial infarction) ([place])   . Obesity     Patient Active Problem List   Diagnosis Date Noted  . Volume overload 08/30/2019  . Anemia due to stage 4 chronic kidney disease ([place]) 08/27/2019  . ESRD (end stage renal disease) ([place])   . Acute on chronic heart failure with preserved ejection fraction (HFpEF) ([place]) 06/05/2019  . Heart failure ([place]) 06/05/2019  . Edema of both lower extremities   . Anemia in chronic kidney disease 01/23/2019  . Benign hypertensive kidney disease with chronic kidney disease 01/23/2019   . Chronic kidney disease (CKD), stage V ([place]) 01/23/2019  . Proteinuria 01/23/2019  . Secondary hyperparathyroidism of renal origin ([place]) 01/23/2019  . Respiratory failure ([place]) 04/09/2017  . Syncopal episodes 12/15/2015  . Anemia of chronic disease 04/14/2015  . Anxiety 04/14/2015  . Clinical depression 04/14/2015  . Essential hypertension 04/14/2015  . Gastro-esophageal reflux disease without esophagitis 04/14/2015  . Arthritis, degenerative 04/14/2015  . Allergic rhinitis 04/14/2015  . Type 2 diabetes mellitus with hyperlipidemia ([place]) 04/14/2015  . Hyperlipidemia 02/27/2015  . Glaucoma 12/23/2014  . Diabetes ([place]) 12/23/2014  . GERD (gastroesophageal reflux disease) 12/23/2014  . Essential hypertension, malignant 05/24/2013  . SOB (shortness of breath) 05/24/2013  . Acute on chronic diastolic CHF (congestive heart failure) ([place]) 05/24/2013    Past Surgical History:  Procedure Laterality Date  . A/V FISTULAGRAM Left 01/15/2020   Procedure: A/V FISTULAGRAM;  Surgeon: Katha Cabal, MD;  Location: [facility];  Service: Cardiovascular;  Laterality: Left;  . ABDOMINAL HYSTERECTOMY    . APPENDECTOMY    . AV FISTULA PLACEMENT Left 11/13/2019   Procedure: INSERTION OF ARTERIOVENOUS (AV) GORE-TEX GRAFT ARM ( BRACHIAL AXILLARY );  Surgeon: Katha Cabal, MD;  Location: [facility];  Service: Vascular;  Laterality: Left;  . DIALYSIS/PERMA CATHETER INSERTION N/A 06/07/2019   Procedure: DIALYSIS/PERMA CATHETER INSERTION;  Surgeon: Katha Cabal, MD;  Location: [facility];  Service: Cardiovascular;  Laterality: N/A;  . DIALYSIS/PERMA CATHETER REMOVAL N/A 02/11/2020   Procedure: DIALYSIS/PERMA  CATHETER REMOVAL;  Surgeon: Katha Cabal, MD;  Location: [facility];  Service: Cardiovascular;  Laterality: N/A;  . EYE SURGERY    . HERNIA REPAIR    . INCISION AND DRAINAGE PERIRECTAL ABSCESS    . KNEE SURGERY    . THYROID SURGERY    . VAGINAL  HYSTERECTOMY      Prior to Admission medications   Medication Sig Start Date End Date Taking? Authorizing Provider  acetaminophen (TYLENOL) 500 MG tablet Take 500-1,000 mg by mouth every 6 (six) hours as needed for mild pain or fever.     [provider]  albuterol (VENTOLIN HFA) 108 (90 Base) MCG/ACT inhaler TAKE 2 PUFFS BY MOUTH EVERY 6 HOURS AS NEEDED FOR WHEEZE OR SHORTNESS OF BREATH 10/05/19   Jerrol Banana., MD  amLODipine (NORVASC) 5 MG tablet Take 1 tablet (5 mg total) by mouth daily. 05/20/20   Jerrol Banana., MD  Bepotastine Besilate 1.5 % SOLN Place 1 drop into both eyes in the morning and at bedtime. Morning & afternoon.    [provider]  brimonidine (ALPHAGAN) 0.2 % ophthalmic solution Place 1 drop into both eyes 2 (two) times daily.     [provider]  calcitRIOL (ROCALTROL) 0.25 MCG capsule Take 0.25 mcg by mouth every Monday, Wednesday, and Friday.  10/23/18   [provider]  carvedilol (COREG) 12.5 MG tablet TAKE 1 TABLET (12.5 MG TOTAL) BY MOUTH 2 (TWO) TIMES DAILY. 04/26/20   Jerrol Banana., MD  [facility] ASPIRIN ADULT LOW DOSE 81 MG chewable tablet CHEW 1 TABLET (81 MG TOTAL) BY MOUTH DAILY. 05/10/20   Jerrol Banana., MD  dorzolamide-timolol (COSOPT) 22.3-6.8 MG/ML ophthalmic solution Place 1 drop into both eyes 2 (two) times daily.  03/06/19   [provider]  latanoprost (XALATAN) 0.005 % ophthalmic solution Place 1 drop into both eyes at bedtime. 02/11/15   [provider]  loratadine (CLARITIN) 10 MG tablet Take 1 tablet (10 mg total) by mouth daily. Patient taking differently: Take 10 mg by mouth daily as needed for allergies. 12/15/14   Jerrol Banana., MD  multivitamin (RENA-VIT) TABS tablet Take 1 tablet by mouth daily.  09/13/19   [provider]  OXYGEN Inhale 2 L into the lungs at bedtime.    [provider]  sertraline (ZOLOFT) 25 MG tablet TAKE 1 TABLET BY MOUTH  EVERY DAY Patient taking differently: Take 25 mg by mouth daily. 01/17/20   Jerrol Banana., MD  telmisartan (MICARDIS) 40 MG tablet Take 40 mg by mouth daily.    [provider]  torsemide (DEMADEX) 100 MG tablet Take 100 mg by mouth daily.    [provider]  traZODone (DESYREL) 150 MG tablet TAKE 1 TABLET (150 MG TOTAL) BY MOUTH AT BEDTIME. 05/07/20   Jerrol Banana., MD    Allergies Antihistamines, chlorpheniramine-type; Paxil [paroxetine]; and Codeine  Family History  Problem Relation Age of Onset  . Heart attack Mother   . Heart disease Sister   . Cancer Sister     Social History Social History   Tobacco Use  . Smoking status: Former Smoker    Types: Cigarettes  . Smokeless tobacco: Never Used  . Tobacco comment: Quit in 2015-2016  Vaping Use  . Vaping Use: Never used  Substance Use Topics  . Alcohol use: No  . Drug use: No     Review of Systems  Constitutional: No  fever/chills Eyes: No visual changes. No discharge ENT: No upper respiratory complaints. Cardiovascular: no chest pain. Respiratory: no cough. No SOB. Gastrointestinal: No abdominal pain.  No nausea, no vomiting.  No diarrhea.  No constipation. Genitourinary: Negative for dysuria. No hematuria Musculoskeletal: Negative for musculoskeletal pain. Skin: Negative for rash, abrasions, lacerations, ecchymosis. Neurological: Negative for headaches, focal weakness or numbness.  10 System ROS otherwise negative.  ____________________________________________   PHYSICAL EXAM:  VITAL SIGNS: ED Triage Vitals  Enc Vitals Group     BP 07/11/20 1622 (!) 164/55     Pulse Rate 07/11/20 1622 62     Resp 07/11/20 1730 16     Temp 07/11/20 1622 98.5 F (36.9 C)     Temp Source 07/11/20 1622 Oral     SpO2 07/11/20 1622 98 %     Weight 07/11/20 1625 125 lb (56.7 kg)     Height 07/11/20 1625 '5\' 3"'$  (1.6 m)     Head Circumference --      Peak Flow --      Pain Score 07/11/20 1624 0      Pain Loc --      Pain Edu? --      Excl. in [place]? --      Constitutional: Alert and oriented. Well appearing and in no acute distress. Eyes: Conjunctivae are normal. PERRL. EOMI. Head: Atraumatic. ENT:      Ears:       Nose: No congestion/rhinnorhea.      Mouth/Throat: Mucous membranes are moist.  Neck: No stridor.   Cardiovascular: Normal rate, regular rhythm. Normal S1 and S2.  Good peripheral circulation. Respiratory: Normal respiratory effort without tachypnea or retractions. Lungs CTAB. Good air entry to the bases with no decreased or absent breath sounds. Gastrointestinal: Bowel sounds 4 quadrants.  Soft to palpation all quadrants.  Patient is slightly tender to palpation of the left lower quadrant.  No other significant tenderness on exam.  No guarding or rigidity. No palpable masses. No distention. No CVA tenderness.  Visualizing the patient's groin reveals large abnormality consistent with hernia.  Attempts at reduction by myself were unsuccessful.  Attending provider, Dr. Ellender Hose also attempts to reduce palpable hernia with no success. Musculoskeletal: Full range of motion to all extremities. No gross deformities appreciated. Neurologic:  Normal speech and language. No gross focal neurologic deficits are appreciated.  Skin:  Skin is warm, dry and intact. No rash noted. Psychiatric: Mood and affect are normal. Speech and behavior are normal. Patient exhibits appropriate insight and judgement.   ____________________________________________   LABS (all labs ordered are listed, but only abnormal results are displayed)  Labs Reviewed  COMPREHENSIVE METABOLIC PANEL - Abnormal; Notable for the following components:      Result Value   Glucose, Bld 207 (*)    BUN 50 (*)    Creatinine, Ser 4.12 (*)    GFR, Estimated 10 (*)    All other components within normal limits  CBC - Abnormal; Notable for the following components:   WBC 12.8 (*)    All other components within normal  limits  SARS CORONAVIRUS 2 (TAT 6-24 HRS)  LIPASE, BLOOD  URINALYSIS, COMPLETE (UACMP) WITH MICROSCOPIC   ____________________________________________  EKG   ____________________________________________  RADIOLOGY I personally viewed and evaluated these images as part of my medical decision making, as well as reviewing the written report by the radiologist.  ED Provider Interpretation: Findings consistent with small bowel obstruction due to bowel containing left inguinal hernia.  CT ABDOMEN  PELVIS WO CONTRAST  Result Date: 07/11/2020 CLINICAL DATA:  Left lower quadrant abdominal pain EXAM: CT ABDOMEN AND PELVIS WITHOUT CONTRAST TECHNIQUE: Multidetector CT imaging of the abdomen and pelvis was performed following the standard protocol without IV contrast. COMPARISON:  None. FINDINGS: Lower chest: Cardiomegaly. Extensive 3 vessel coronary artery calcifications and/or stents. Small pericardial effusion. Hepatobiliary: No solid liver abnormality is seen. Calcified gallstones in the gallbladder. Gallbladder wall thickening, or biliary dilatation. Pancreas: Unremarkable. No pancreatic ductal dilatation or surrounding inflammatory changes. Spleen: Normal in size without significant abnormality. Adrenals/Urinary Tract: Adrenal glands are unremarkable. Kidneys are normal, without renal calculi, solid lesion, or hydronephrosis. Bladder is unremarkable. Stomach/Bowel: Stomach is within normal limits. There are numerous distended loops of small bowel in the mid abdomen, measuring up to 3.5 cm in caliber. There is an abrupt caliber change of the bowel distal to a left-sided inguinal hernia containing a single knuckled loop of ileum (series 2, image 73). Descending and sigmoid diverticulosis. Vascular/Lymphatic: Aortic atherosclerosis. No enlarged abdominal or pelvic lymph nodes. Reproductive: Status post hysterectomy. Other: Left-sided inguinal hernia containing obstructed appearing with bowel as detailed  above. Small volume ascites throughout the abdomen and pelvis. Musculoskeletal: No acute or significant osseous findings. IMPRESSION: 1. There are numerous distended loops of small bowel in the mid abdomen, measuring up to 3.5 cm in caliber. There is an abrupt caliber change of the bowel distal to a left-sided inguinal hernia containing a single knuckled loop of ileum. Findings are consistent with small bowel obstruction. 2. Small volume ascites throughout the abdomen and pelvis, likely reactive. 3. Cholelithiasis without evidence of acute cholecystitis. 4. Cardiomegaly and coronary artery disease. 5. Small pericardial effusion. Aortic Atherosclerosis (ICD10-I70.0). Electronically Signed   By: Eddie Candle M.D.   On: 07/11/2020 18:34    ____________________________________________    PROCEDURES  Procedure(s) performed:    Procedures    Medications - No data to display   ____________________________________________   INITIAL IMPRESSION / ASSESSMENT AND PLAN / ED COURSE  Pertinent labs & imaging results that were available during my care of the patient were reviewed by me and considered in my medical decision making (see chart for details).  Review of the [place] CSRS was performed in accordance of the [place] prior to dispensing any controlled drugs.           Patient's diagnosis is consistent with small bowel obstruction with unilateral inguinal hernia with obstruction.  Patient presents emergency department with malaise, nausea vomiting and some diarrhea x2 to 3 days.  Patient missed dialysis yesterday due to the symptoms.  She presented today.  Initially very minimal tenderness was appreciated left lower quadrant without any other specific findings.  Imaging returns with small bowel obstruction secondary to bowel containing left inguinal hernia.  I discussed the patient with on-call surgeon, Dr. Hampton Abbot.  He recommends NG tube with attempts at manual reduction of hernia.  Unfortunately I  was unable to reduce the hernia.  I had attending provider, Dr. Ellender Hose also evaluate the patient and he was unable to reduce this hernia.  I called surgery who is coming in to assess the patient in the emergency department to see whether this is able to be reduced in the ED versus needing to go to the [facility]..    ____________________________________________  FINAL CLINICAL IMPRESSION(S) / ED DIAGNOSES  Final diagnoses:  SBO (small bowel obstruction) (HCC)  Unilateral inguinal hernia with obstruction and without gangrene, recurrence not specified      NEW MEDICATIONS STARTED  DURING THIS VISIT:  ED Discharge Orders    None          This chart was dictated using voice recognition software/Dragon. Despite best efforts to proofread, errors can occur which can change the meaning. Any change was purely unintentional.    Brynda Peon 07/11/20 2147    Duffy Bruce, MD 07/12/20 [phone]

## 2020-07-11 NOTE — ED Notes (Signed)
Ng advanced to 61 at the nose by surgeon.

## 2020-07-12 ENCOUNTER — Encounter
Admission: EM | Disposition: A | Discharge status: Home Health | Payer: Self-pay | Source: Home / Self Care | Attending: Internal Medicine

## 2020-07-12 ENCOUNTER — Inpatient Hospital Stay: Payer: Medicare Other | Admitting: Certified Registered"

## 2020-07-12 DIAGNOSIS — I5032 Chronic diastolic (congestive) heart failure: Secondary | ICD-10-CM | POA: Diagnosis not present

## 2020-07-12 DIAGNOSIS — I10 Essential (primary) hypertension: Secondary | ICD-10-CM

## 2020-07-12 DIAGNOSIS — Z992 Dependence on renal dialysis: Secondary | ICD-10-CM

## 2020-07-12 DIAGNOSIS — E1122 Type 2 diabetes mellitus with diabetic chronic kidney disease: Secondary | ICD-10-CM

## 2020-07-12 DIAGNOSIS — K4030 Unilateral inguinal hernia, with obstruction, without gangrene, not specified as recurrent: Secondary | ICD-10-CM

## 2020-07-12 DIAGNOSIS — N186 End stage renal disease: Secondary | ICD-10-CM

## 2020-07-12 HISTORY — PX: INGUINAL HERNIA REPAIR: SHX194

## 2020-07-12 LAB — CBC
HCT: 36 % (ref 36.0–46.0)
Hemoglobin: 11.4 g/dL — ABNORMAL LOW (ref 12.0–15.0)
MCH: 28 pg (ref 26.0–34.0)
MCHC: 31.7 g/dL (ref 30.0–36.0)
MCV: 88.5 fL (ref 80.0–100.0)
Platelets: 239 10*3/uL (ref 150–400)
RBC: 4.07 MIL/uL (ref 3.87–5.11)
RDW: 14.1 % (ref 11.5–15.5)
WBC: 10.6 10*3/uL — ABNORMAL HIGH (ref 4.0–10.5)
nRBC: 0 % (ref 0.0–0.2)

## 2020-07-12 LAB — GLUCOSE, CAPILLARY
Glucose-Capillary: 178 mg/dL — ABNORMAL HIGH (ref 70–99)
Glucose-Capillary: 186 mg/dL — ABNORMAL HIGH (ref 70–99)
Glucose-Capillary: 99 mg/dL (ref 70–99)
Glucose-Capillary: 189 mg/dL — ABNORMAL HIGH (ref 70–99)

## 2020-07-12 LAB — BASIC METABOLIC PANEL
Anion gap: 10 (ref 5–15)
BUN: 51 mg/dL — ABNORMAL HIGH (ref 8–23)
CO2: 23 mmol/L (ref 22–32)
Calcium: 9.1 mg/dL (ref 8.9–10.3)
Chloride: 104 mmol/L (ref 98–111)
Creatinine, Ser: 3.93 mg/dL — ABNORMAL HIGH (ref 0.44–1.00)
GFR, Estimated: 11 mL/min — ABNORMAL LOW (ref >60)
Glucose, Bld: 196 mg/dL — ABNORMAL HIGH (ref 70–99)
Potassium: 4.3 mmol/L (ref 3.5–5.1)
Sodium: 137 mmol/L (ref 135–145)

## 2020-07-12 LAB — HEMOGLOBIN A1C
Hgb A1c MFr Bld: 6.4 % — ABNORMAL HIGH (ref 4.8–5.6)
Mean Plasma Glucose: 136.98 mg/dL

## 2020-07-12 LAB — CBG MONITORING, ED: Glucose-Capillary: 189 mg/dL — ABNORMAL HIGH (ref 70–99)

## 2020-07-12 SURGERY — REPAIR, HERNIA, INGUINAL, ADULT
Anesthesia: General

## 2020-07-12 MED ORDER — ONDANSETRON HCL 4 MG/2ML IJ SOLN: 4.0000 mg | Freq: Once | INTRAMUSCULAR | Status: DC | PRN

## 2020-07-12 MED ORDER — LIDOCAINE HCL (CARDIAC) PF 100 MG/5ML IV SOSY
PREFILLED_SYRINGE | INTRAVENOUS | Status: DC | PRN
  Administered 2020-07-12: 60 mg via INTRAVENOUS

## 2020-07-12 MED ORDER — DEXAMETHASONE SODIUM PHOSPHATE 10 MG/ML IJ SOLN
INTRAMUSCULAR | Status: AC
  Filled 2020-07-12: qty 1

## 2020-07-12 MED ORDER — FENTANYL CITRATE (PF) 100 MCG/2ML IJ SOLN
INTRAMUSCULAR | Status: DC | PRN
  Administered 2020-07-12: 25 ug via INTRAVENOUS

## 2020-07-12 MED ORDER — SODIUM CHLORIDE 0.9 % IV SOLN: INTRAVENOUS | Status: DC

## 2020-07-12 MED ORDER — LACTATED RINGERS IV SOLN: INTRAVENOUS | Status: DC | PRN

## 2020-07-12 MED ORDER — BUPIVACAINE HCL (PF) 0.25 % IJ SOLN
INTRAMUSCULAR | Status: DC | PRN
  Administered 2020-07-12: 30 mL

## 2020-07-12 MED ORDER — ONDANSETRON HCL 4 MG/2ML IJ SOLN
INTRAMUSCULAR | Status: AC
  Filled 2020-07-12: qty 2

## 2020-07-12 MED ORDER — DEXTROSE-NACL 5-0.45 % IV SOLN
INTRAVENOUS | Status: DC
Start: 2020-07-12 — End: 2020-07-12

## 2020-07-12 MED ORDER — PROPOFOL 10 MG/ML IV BOLUS
INTRAVENOUS | Status: DC | PRN
  Administered 2020-07-12: 60 mg via INTRAVENOUS

## 2020-07-12 MED ORDER — EPHEDRINE SULFATE 50 MG/ML IJ SOLN
INTRAMUSCULAR | Status: DC | PRN
  Administered 2020-07-12 (×2): 5 mg via INTRAVENOUS

## 2020-07-12 MED ORDER — SODIUM CHLORIDE 0.45 % IV SOLN: INTRAVENOUS | Status: DC

## 2020-07-12 MED ORDER — MORPHINE SULFATE (PF) 2 MG/ML IV SOLN: 1.0000 mg | INTRAVENOUS | Status: DC | PRN

## 2020-07-12 MED ORDER — DEXAMETHASONE SODIUM PHOSPHATE 10 MG/ML IJ SOLN
INTRAMUSCULAR | Status: DC | PRN
  Administered 2020-07-12: 5 mg via INTRAVENOUS

## 2020-07-12 MED ORDER — ROCURONIUM BROMIDE 100 MG/10ML IV SOLN
INTRAVENOUS | Status: DC | PRN
  Administered 2020-07-12: 40 mg via INTRAVENOUS

## 2020-07-12 MED ORDER — BUPIVACAINE LIPOSOME 1.3 % IJ SUSP
INTRAMUSCULAR | Status: DC | PRN
  Administered 2020-07-12: 20 mL

## 2020-07-12 MED ORDER — FENTANYL CITRATE (PF) 100 MCG/2ML IJ SOLN: 25.0000 ug | INTRAMUSCULAR | Status: DC | PRN

## 2020-07-12 MED ORDER — EPHEDRINE 5 MG/ML INJ
INTRAVENOUS | Status: AC
  Filled 2020-07-12: qty 10

## 2020-07-12 MED ORDER — SUGAMMADEX SODIUM 200 MG/2ML IV SOLN
INTRAVENOUS | Status: DC | PRN
  Administered 2020-07-12: 100 mg via INTRAVENOUS

## 2020-07-12 MED ORDER — SUCCINYLCHOLINE CHLORIDE 20 MG/ML IJ SOLN
INTRAMUSCULAR | Status: DC | PRN
  Administered 2020-07-12: 100 mg via INTRAVENOUS

## 2020-07-12 SURGICAL SUPPLY — 37 items
ADH SKN CLS APL DERMABOND .7 (GAUZE/BANDAGES/DRESSINGS) ×1
APL PRP STRL LF DISP 70% ISPRP (MISCELLANEOUS) ×1
BLADE SURG 15 STRL LF DISP TIS (BLADE) ×1 IMPLANT
BLADE SURG 15 STRL SS (BLADE) ×2
BULB RESERV EVAC DRAIN JP 100C (MISCELLANEOUS) ×2 IMPLANT
CANISTER SUCT 1200ML W/VALVE (MISCELLANEOUS) ×2 IMPLANT
CHLORAPREP W/TINT 26 (MISCELLANEOUS) ×2 IMPLANT
COVER WAND RF STERILE (DRAPES) ×2 IMPLANT
DERMABOND ADVANCED (GAUZE/BANDAGES/DRESSINGS) ×1
DERMABOND ADVANCED .7 DNX12 (GAUZE/BANDAGES/DRESSINGS) ×1 IMPLANT
DRAIN CHANNEL JP 15F RND 16 (MISCELLANEOUS) ×2 IMPLANT
DRAIN PENROSE 1/4X12 LTX STRL (WOUND CARE) ×2 IMPLANT
DRAPE LAPAROTOMY 100X77 ABD (DRAPES) ×2 IMPLANT
ELECT CAUTERY BLADE 6.4 (BLADE) ×2 IMPLANT
ELECT REM PT RETURN 9FT ADLT (ELECTROSURGICAL) ×2
ELECTRODE REM PT RTRN 9FT ADLT (ELECTROSURGICAL) ×1 IMPLANT
GLOVE SURG SYN 7.0 (GLOVE) ×2 IMPLANT
GLOVE SURG SYN 7.5  E (GLOVE) ×1
GLOVE SURG SYN 7.5 E (GLOVE) ×1 IMPLANT
GOWN STRL REUS W/ TWL LRG LVL3 (GOWN DISPOSABLE) ×2 IMPLANT
GOWN STRL REUS W/TWL LRG LVL3 (GOWN DISPOSABLE) ×4
LABEL OR SOLS (LABEL) ×2 IMPLANT
MANIFOLD NEPTUNE II (INSTRUMENTS) ×2 IMPLANT
NEEDLE HYPO 22GX1.5 SAFETY (NEEDLE) ×2 IMPLANT
NS IRRIG 500ML POUR BTL (IV SOLUTION) ×2 IMPLANT
PACK BASIN MINOR ARMC (MISCELLANEOUS) ×2 IMPLANT
SPONGE LAP 18X18 RF (DISPOSABLE) ×2 IMPLANT
SUT MNCRL 4-0 (SUTURE) ×2
SUT MNCRL 4-0 27XMFL (SUTURE) ×1
SUT PROLENE 2 0 SH DA (SUTURE) ×4 IMPLANT
SUT VIC AB 2-0 CT1 (SUTURE) ×2 IMPLANT
SUT VIC AB 3-0 SH 27 (SUTURE) ×2
SUT VIC AB 3-0 SH 27X BRD (SUTURE) ×1 IMPLANT
SUTURE MNCRL 4-0 27XMF (SUTURE) ×1 IMPLANT
SYR 10ML LL (SYRINGE) ×2 IMPLANT
SYR 30ML LL (SYRINGE) ×2 IMPLANT
SYR BULB IRRIG 60ML STRL (SYRINGE) ×2 IMPLANT

## 2020-07-12 NOTE — Evaluation (Signed)
Physical Therapy Evaluation Patient Details Name: Lori Stanley MRN: [id number] DOB: Jul 21, 1931 Today's Date: 07/12/2020   History of Present Illness  Pt admitted for incarcerated L inguinal hernia s/p repain on 3/13. Complaints of N/V and abdominal pain. History includes HD, anemia, anxiety, depression, DM, GERD, HTN, and MI.  Clinical Impression  Pt is a pleasant 87 year old female who was admitted for complaints of N/V with incarcerated hernia now s/p repair. Pt performs bed mobility with supervision, transfers with cga, and ambulation with cga and RW. Pt demonstrates deficits with endurance/mobility/strength. Good family support at home. Would benefit from skilled PT to address above deficits and promote optimal return to PLOF. Recommend transition to [place] upon discharge from acute hospitalization.     Follow Up Recommendations Home health PT    Equipment Recommendations  Rolling walker with 5" wheels    Recommendations for Other Services       Precautions / Restrictions Precautions Precautions: Fall      Mobility  Bed Mobility Overal bed mobility: Needs Assistance Bed Mobility: Supine to Sit     Supine to sit: Supervision     General bed mobility comments: safe technique. Does use railing to assist    Transfers Overall transfer level: Needs assistance Equipment used: Rolling walker (2 wheeled) Transfers: Sit to/from Stand Sit to Stand: Min guard         General transfer comment: Safe technique with upright posture  Ambulation/Gait Ambulation/Gait assistance: Min guard Gait Distance (Feet): 3 Feet Assistive device: Rolling walker (2 wheeled) Gait Pattern/deviations: Step-to pattern     General Gait Details: ambulated short distance to recliner. Declines further distances at this time due to fatigue. Safe technique. O2 sats at 90% on RA, left on RA, RN notified  Stairs            Wheelchair Mobility    Modified Rankin (Stroke Patients Only)        Balance Overall balance assessment: Needs assistance Sitting-balance support: Feet supported Sitting balance-Leahy Scale: Good     Standing balance support: Bilateral upper extremity supported Standing balance-Leahy Scale: Good                               Pertinent Vitals/Pain Pain Assessment: No/denies pain    Home Living Family/patient expects to be discharged to:: Private residence Living Arrangements: Alone Available Help at Discharge: Family;Available 24 hours/day (sister planning to stay with her at discharge) Type of Home: House Home Access: Stairs to enter Entrance Stairs-Rails: Can reach both Entrance Stairs-Number of Steps: 4 Home Layout: One level Home Equipment: Bedside commode (reports her RW is broken)      Prior Function Level of Independence: Independent with assistive device(s)         Comments: Was previously using RW, however is broken. Drives     Hand Dominance        Extremity/Trunk Assessment   Upper Extremity Assessment Upper Extremity Assessment: Overall WFL for tasks assessed    Lower Extremity Assessment Lower Extremity Assessment: Generalized weakness (B LE grossly 4/5)       Communication   Communication: No difficulties  Cognition Arousal/Alertness: Awake/alert Behavior During Therapy: WFL for tasks assessed/performed Overall Cognitive Status: Within Functional Limits for tasks assessed  General Comments      Exercises Other Exercises Other Exercises: seated ther-ex performed on B LE including AP, quad sets, and hip abd/add. 10 reps with supervision. Encouraged to continue throughout stay. Educated on frequency/duration.   Assessment/Plan    PT Assessment Patient needs continued PT services  PT Problem List Decreased strength;Decreased activity tolerance;Decreased balance;Decreased mobility       PT Treatment Interventions DME  instruction;Gait training;Therapeutic exercise;Balance training    PT Goals (Current goals can be found in the Care Plan section)  Acute Rehab PT Goals Patient Stated Goal: to go home PT Goal Formulation: With patient Time For Goal Achievement: 07/26/20 Potential to Achieve Goals: Good    Frequency Min 2X/week   Barriers to discharge        Co-evaluation               AM-PAC PT "6 Clicks" Mobility  Outcome Measure Help needed turning from your back to your side while in a flat bed without using bedrails?: A Little Help needed moving from lying on your back to sitting on the side of a flat bed without using bedrails?: A Little Help needed moving to and from a bed to a chair (including a wheelchair)?: A Little Help needed standing up from a chair using your arms (e.g., wheelchair or bedside chair)?: A Little Help needed to walk in hospital room?: A Little Help needed climbing 3-5 steps with a railing? : A Little 6 Click Score: 18    End of Session   Activity Tolerance: Patient tolerated treatment well Patient left: in chair;with chair alarm set Nurse Communication: Mobility status PT Visit Diagnosis: Unsteadiness on feet (R26.81);Muscle weakness (generalized) (M62.81);Difficulty in walking, not elsewhere classified (R26.2)    Time: 1336-1400 PT Time Calculation (min) (ACUTE ONLY): 24 min   Charges:   PT Evaluation $PT Eval Low Complexity: 1 Low PT Treatments $Therapeutic Exercise: 8-22 mins        Greggory Stallion, PT, DPT [phone]   Lori Stanley 07/12/2020, 3:51 PM

## 2020-07-12 NOTE — ED Notes (Signed)
P[t to OR with [place], rn.

## 2020-07-12 NOTE — Plan of Care (Signed)
Plan of care discussed with pt.

## 2020-07-12 NOTE — Consult Note (Signed)
Referring Provider: No ref. provider found Primary Care Physician:  Jerrol Banana., MD Primary Nephrologist:  Dr.   Luiz Iron for Consultation: ESRD  HPI: 87 year old female with history of end-stage renal disease on hemodialysis Monday and Friday with chronic diastolic heart failure, diabetes, hypertension, secondary hyperparathyroidism now admitted with history of persistent nausea vomiting and diarrhea. She was found to have a incarcerated inguinal hernia.  She had it repaired last night.  Past Medical History:  Diagnosis Date  . Acute heart failure ([place])   . Anemia   . Anxiety   . CHF (congestive heart failure) ([place])   . Chronic kidney disease    stage V; end stage renal disease  . Chronic low back pain   . Depression   . Diabetes mellitus without complication ([place])   . GERD (gastroesophageal reflux disease)   . Hyperlipidemia   . Hypertension   . MI (myocardial infarction) ([place])   . Obesity     Past Surgical History:  Procedure Laterality Date  . A/V FISTULAGRAM Left 01/15/2020   Procedure: A/V FISTULAGRAM;  Surgeon: Katha Cabal, MD;  Location: [facility];  Service: Cardiovascular;  Laterality: Left;  . ABDOMINAL HYSTERECTOMY    . APPENDECTOMY    . AV FISTULA PLACEMENT Left 11/13/2019   Procedure: INSERTION OF ARTERIOVENOUS (AV) GORE-TEX GRAFT ARM ( BRACHIAL AXILLARY );  Surgeon: Katha Cabal, MD;  Location: [facility];  Service: Vascular;  Laterality: Left;  . DIALYSIS/PERMA CATHETER INSERTION N/A 06/07/2019   Procedure: DIALYSIS/PERMA CATHETER INSERTION;  Surgeon: Katha Cabal, MD;  Location: [place] [facility];  Service: Cardiovascular;  Laterality: N/A;  . DIALYSIS/PERMA CATHETER REMOVAL N/A 02/11/2020   Procedure: DIALYSIS/PERMA CATHETER REMOVAL;  Surgeon: Katha Cabal, MD;  Location: [place] [facility];  Service: Cardiovascular;  Laterality: N/A;  . EYE SURGERY    . HERNIA REPAIR    . INCISION AND DRAINAGE PERIRECTAL ABSCESS     . KNEE SURGERY    . THYROID SURGERY    . VAGINAL HYSTERECTOMY      Prior to Admission medications   Medication Sig Start Date End Date Taking? Authorizing Provider  acetaminophen (TYLENOL) 500 MG tablet Take 500-1,000 mg by mouth every 6 (six) hours as needed for mild pain or fever.    Yes [provider]  albuterol (VENTOLIN HFA) 108 (90 Base) MCG/ACT inhaler TAKE 2 PUFFS BY MOUTH EVERY 6 HOURS AS NEEDED FOR WHEEZE OR SHORTNESS OF BREATH 10/05/19  Yes Jerrol Banana., MD  amLODipine (NORVASC) 5 MG tablet Take 1 tablet (5 mg total) by mouth daily. 05/20/20  Yes Jerrol Banana., MD  Bepotastine Besilate 1.5 % SOLN Place 1 drop into both eyes in the morning and at bedtime. Morning & afternoon.   Yes [provider]  calcitRIOL (ROCALTROL) 0.25 MCG capsule Take 0.25 mcg by mouth every Monday, Wednesday, and Friday.  10/23/18  Yes [provider]  carvedilol (COREG) 12.5 MG tablet TAKE 1 TABLET (12.5 MG TOTAL) BY MOUTH 2 (TWO) TIMES DAILY. 04/26/20  Yes Jerrol Banana., MD  [facility] ASPIRIN ADULT LOW DOSE 81 MG chewable tablet CHEW 1 TABLET (81 MG TOTAL) BY MOUTH DAILY. 05/10/20  Yes Jerrol Banana., MD  dorzolamide-timolol (COSOPT) 22.3-6.8 MG/ML ophthalmic solution Place 1 drop into both eyes 2 (two) times daily.  03/06/19  Yes [provider]  latanoprost (XALATAN) 0.005 % ophthalmic solution Place 1 drop into both eyes at bedtime. 02/11/15  Yes [provider]  loratadine (CLARITIN) 10 MG tablet Take 1 tablet (10 mg total) by mouth daily. Patient taking differently: Take 10 mg by mouth daily as needed for allergies. 12/15/14  Yes Jerrol Banana., MD  multivitamin (RENA-VIT) TABS tablet Take 1 tablet by mouth daily.  09/13/19  Yes [provider]  sertraline (ZOLOFT) 25 MG tablet TAKE 1 TABLET BY MOUTH EVERY DAY Patient taking differently: Take 25 mg by mouth daily. 01/17/20  Yes Jerrol Banana., MD  telmisartan  (MICARDIS) 40 MG tablet Take 40 mg by mouth daily.   Yes [provider]  torsemide (DEMADEX) 100 MG tablet Take 100 mg by mouth daily.   Yes [provider]  traZODone (DESYREL) 150 MG tablet TAKE 1 TABLET (150 MG TOTAL) BY MOUTH AT BEDTIME. 05/07/20  Yes Jerrol Banana., MD  brimonidine [facility]) 0.2 % ophthalmic solution Place 1 drop into both eyes 2 (two) times daily.     [provider]  OXYGEN Inhale 2 L into the lungs at bedtime.    [provider]    Current Facility-Administered Medications  Medication Dose Route Frequency Provider Last Rate Last Admin  . fentaNYL (SUBLIMAZE) injection 12.5 mcg  12.5 mcg Intravenous Q3H PRN Piscoya, Jose, MD      . insulin aspart (novoLOG) injection 0-6 Units  0-6 Units Subcutaneous TID WC Piscoya, Jose, MD      . morphine 2 MG/ML injection 1 mg  1 mg Intravenous Q3H PRN Piscoya, Jose, MD      . ondansetron (ZOFRAN) injection 4 mg  4 mg Intravenous Q6H PRN Piscoya, Jose, MD      . piperacillin-tazobactam (ZOSYN) IVPB 2.25 g  2.25 g Intravenous Q8H Olean Ree, MD   Stopped at 07/12/20 0011    Allergies as of 07/11/2020 - Review Complete 07/11/2020  Allergen Reaction Noted  . Antihistamines, chlorpheniramine-type Other (See Comments) 04/14/2015  . Paxil [paroxetine]  06/16/2016  . Codeine Rash and Other (See Comments) 05/22/2013    Family History  Problem Relation Age of Onset  . Heart attack Mother   . Heart disease Sister   . Cancer Sister     Social History   Socioeconomic History  . Marital status: Widowed    Spouse name: Not on file  . Number of children: 1  . Years of education: Not on file  . Highest education level: 11th grade  Occupational History  . Occupation: retired  Tobacco Use  . Smoking status: Former Smoker    Types: Cigarettes  . Smokeless tobacco: Never Used  . Tobacco comment: Quit in 2015-2016  Vaping Use  . Vaping Use: Never used  Substance and Sexual Activity  .  Alcohol use: No  . Drug use: No  . Sexual activity: Never  Other Topics Concern  . Not on file  Social History Narrative   Lives in [place]; self; friends do chores; quit smoking many years; no alcohol. Worked in [employer].    Social Determinants of Health   Financial Resource Strain: Low Risk   . Difficulty of Paying Living Expenses: Not hard at all  Food Insecurity: No Food Insecurity  . Worried About Charity fundraiser in the Last Year: Never true  . Ran Out of Food in the Last Year: Never true  Transportation Needs: No Transportation Needs  . Lack of Transportation (Medical): No  . Lack of Transportation (Non-Medical): No  Physical Activity: Inactive  . Days of Exercise  per Week: 0 days  . Minutes of Exercise per Session: 0 min  Stress: No Stress Concern Present  . Feeling of Stress : Not at all  Social Connections: Socially Isolated  . Frequency of Communication with Friends and Family: More than three times a week  . Frequency of Social Gatherings with Friends and Family: Three times a week  . Attends Religious Services: Never  . Active Member of Clubs or Organizations: No  . Attends Archivist Meetings: Never  . Marital Status: Widowed  Intimate Partner Violence: Not At Risk  . Fear of Current or Ex-Partner: No  . Emotionally Abused: No  . Physically Abused: No  . Sexually Abused: No    Review of Systems: Gen: Denies any fever, chills, sweats, anorexia, fatigue, weakness, malaise, weight loss, and sleep disorder HEENT: No visual complaints, No history of Retinopathy. Normal external appearance No Epistaxis or Sore throat. No sinusitis.   CV: Denies chest pain, angina, palpitations, syncope, orthopnea, PND, peripheral edema, and claudication. Resp: Denies dyspnea at rest, dyspnea with exercise, cough, sputum, wheezing, coughing up blood, and pleurisy. GI: Denies vomiting blood, jaundice, and fecal incontinence.   Denies dysphagia or odynophagia. GU :  Denies urinary burning, blood in urine, urinary frequency, urinary hesitancy, nocturnal urination, and urinary incontinence.  No renal calculi. MS: Denies joint pain, limitation of movement, and swelling, stiffness, low back pain, extremity pain. Denies muscle weakness, cramps, atrophy.  No use of non steroidal antiinflammatory drugs. Derm: Denies rash, itching, dry skin, hives, moles, warts, or unhealing ulcers.  Psych: Denies depression, anxiety, memory loss, suicidal ideation, hallucinations, paranoia, and confusion. Heme: Denies bruising, bleeding, and enlarged lymph nodes. Neuro: No headache.  No diplopia. No dysarthria.  No dysphasia.  No history of CVA.  No Seizures. No paresthesias.  No weakness. Endocrine No DM.  No Thyroid disease.  No Adrenal disease.  Physical Exam: Vital signs in last 24 hours: Temp:  [97.2 F (36.2 C)-98.8 F (37.1 C)] 98 F (36.7 C) (03/13 1004) Pulse Rate:  [40-76] 72 (03/13 1004) Resp:  [9-28] 16 (03/13 1004) BP: (149-172)/(54-73) 153/60 (03/13 1004) SpO2:  [90 %-100 %] 98 % (03/13 1004) Weight:  [56.7 kg] 56.7 kg (03/12 1625)   General:   Alert,  Well-developed, well-nourished, pleasant and cooperative in NAD Head:  Normocephalic and atraumatic. Eyes:  Sclera clear, no icterus.   Conjunctiva pink. Ears:  Normal auditory acuity. Nose:  No deformity, discharge,  or lesions. Mouth:  No deformity or lesions, dentition normal. Neck:  Supple; no masses or thyromegaly. JVP not elevated Lungs:  Clear throughout to auscultation.   No wheezes, crackles, or rhonchi. No acute distress. Heart:  Regular rate and rhythm; no murmurs, clicks, rubs,  or gallops. Abdomen:  Soft, nontender and nondistended. No masses, hepatosplenomegaly or hernias noted. Normal bowel sounds, without guarding, and without rebound.   Msk:  Symmetrical without gross deformities. Normal posture. Pulses:  No carotid, renal, femoral bruits. DP and PT symmetrical and equal Extremities:   Without clubbing or edema. Neurologic:  Alert and  oriented x4;  grossly normal neurologically. Skin:  Intact without significant lesions or rashes. Cervical Nodes:  No significant cervical adenopathy. Psych:  Alert and cooperative. Normal mood and affect.  Intake/Output from previous day: 03/12 0701 - 03/13 0700 In: 550 [I.V.:500; IV Piggyback:50] Out: 750 [Urine:310; Drains:20; Blood:20] Intake/Output this shift: Total I/O In: -  Out: 6 [Urine:30; Drains:10]  Lab Results: Recent Labs    07/11/20 1636 07/12/20 0840  WBC 12.8* 10.6*  HGB 12.7 11.4*  HCT 39.9 36.0  PLT 285 239   BMET Recent Labs    07/11/20 1636 07/12/20 0840  NA 135 137  K 4.7 4.3  CL 99 104  CO2 25 23  GLUCOSE 207* 196*  BUN 50* 51*  CREATININE 4.12* 3.93*  CALCIUM 9.9 9.1   LFT Recent Labs    07/11/20 1636  PROT 7.5  ALBUMIN 4.2  AST 23  ALT 17  ALKPHOS 92  BILITOT 0.9   PT/INR No results for input(s): LABPROT, INR in the last 72 hours. Hepatitis Panel No results for input(s): HEPBSAG, HCVAB, HEPAIGM, HEPBIGM in the last 72 hours.  Studies/Results: CT ABDOMEN PELVIS WO CONTRAST  Result Date: 07/11/2020 CLINICAL DATA:  Left lower quadrant abdominal pain EXAM: CT ABDOMEN AND PELVIS WITHOUT CONTRAST TECHNIQUE: Multidetector CT imaging of the abdomen and pelvis was performed following the standard protocol without IV contrast. COMPARISON:  None. FINDINGS: Lower chest: Cardiomegaly. Extensive 3 vessel coronary artery calcifications and/or stents. Small pericardial effusion. Hepatobiliary: No solid liver abnormality is seen. Calcified gallstones in the gallbladder. Gallbladder wall thickening, or biliary dilatation. Pancreas: Unremarkable. No pancreatic ductal dilatation or surrounding inflammatory changes. Spleen: Normal in size without significant abnormality. Adrenals/Urinary Tract: Adrenal glands are unremarkable. Kidneys are normal, without renal calculi, solid lesion, or hydronephrosis.  Bladder is unremarkable. Stomach/Bowel: Stomach is within normal limits. There are numerous distended loops of small bowel in the mid abdomen, measuring up to 3.5 cm in caliber. There is an abrupt caliber change of the bowel distal to a left-sided inguinal hernia containing a single knuckled loop of ileum (series 2, image 73). Descending and sigmoid diverticulosis. Vascular/Lymphatic: Aortic atherosclerosis. No enlarged abdominal or pelvic lymph nodes. Reproductive: Status post hysterectomy. Other: Left-sided inguinal hernia containing obstructed appearing with bowel as detailed above. Small volume ascites throughout the abdomen and pelvis. Musculoskeletal: No acute or significant osseous findings. IMPRESSION: 1. There are numerous distended loops of small bowel in the mid abdomen, measuring up to 3.5 cm in caliber. There is an abrupt caliber change of the bowel distal to a left-sided inguinal hernia containing a single knuckled loop of ileum. Findings are consistent with small bowel obstruction. 2. Small volume ascites throughout the abdomen and pelvis, likely reactive. 3. Cholelithiasis without evidence of acute cholecystitis. 4. Cardiomegaly and coronary artery disease. 5. Small pericardial effusion. Aortic Atherosclerosis (ICD10-I70.0). Electronically Signed   By: Eddie Candle M.D.   On: 07/11/2020 18:34   DG Abdomen 1 View  Result Date: 07/11/2020 CLINICAL DATA:  Enteric catheter placement EXAM: ABDOMEN - 1 VIEW COMPARISON:  06/06/2019 FINDINGS: Frontal view of the lower chest and upper abdomen demonstrates enteric catheter tip and side port projecting over the distal thoracic esophagus. Recommend advancing at least 7.5 cm to ensure side port placement within the gastric lumen. Chronic elevation of the right hemidiaphragm. Diffuse gaseous distention of the small bowel concerning for obstruction. IMPRESSION: 1. Enteric catheter as above, tip and side port projecting over distal thoracic esophagus. Recommend  advancing at least 7.5 cm to ensure side port placement within the gastric lumen. 2. Small bowel obstruction. Electronically Signed   By: Randa Ngo M.D.   On: 07/11/2020 20:52    Assessment/Plan:  87 year old female with multiple medical problems including diabetes, hypertension, congestive heart failure, end-stage renal disease on dialysis now admitted for incarcerated inguinal hernia status post repair.  Patient presently hemodynamically stable. Will arrange for dialysis tomorrow morning. Continue the present medications as ordered.  LOS: 1 Madhu Korrapati '@TODAY''@12'$ :14 PM

## 2020-07-12 NOTE — Anesthesia Postprocedure Evaluation (Signed)
Anesthesia Post Note  Patient: Lori Stanley [facility]  Procedure(s) Performed: HERNIA REPAIR INGUINAL ADULT (N/A )  Patient location during evaluation: PACU Anesthesia Type: General Level of consciousness: awake and alert Pain management: pain level controlled Vital Signs Assessment: post-procedure vital signs reviewed and stable Respiratory status: spontaneous breathing and respiratory function stable Cardiovascular status: stable Anesthetic complications: no   No complications documented.   Last Vitals:  Vitals:   07/12/20 0030 07/12/20 0445  BP:  (!) 170/61  Pulse: 63 76  Resp: 18 18  Temp:  (!) 36.2 C  SpO2: 100% 100%    Last Pain:  Vitals:   07/11/20 1624  TempSrc:   PainSc: 0-No pain                 KEPHART,WILLIAM K

## 2020-07-12 NOTE — Op Note (Signed)
Procedure Date:  07/12/2020  Pre-operative Diagnosis:  Incarcerated Left Femoral hernia resulting in small bowel obstruction  Post-operative Diagnosis:  Incarcerated Left Femoral hernia resulting in small bowel obstruction  Procedure:  Open Left Incarcerated Femoral Hernia Repair  Surgeon:  Melvyn Neth, MD  Anesthesia:  General endotracheal  Estimated Blood Loss:  10 ml  Specimens:  Hernia sac  Complications:  None  Findings:  The patient had a thick hernia sac consistent with this being a chronic hernia.  The small bowel knuckle that was incarcerated was hyperemic but viable and was reduced through the femoral defect.  The patient had about 200 ml of ascitic fluid that was suctioned through the hernia defect.  Indications for Procedure:  This is a 87 y.o. female who presents with an incarcerated left femoral hernia resulting in small bowel obstruction.  Multiple unsuccessful attempts at reduction at bedside.  Discussed with the patient and her sister that given this, would have to take her urgently to the OR to prevent strangulation and treat the small bowel obstruction. The risks of bleeding, abscess or infection, recurrence of symptoms, potential for an open laparotomy and small bowel resection, injury to surrounding structures, and chronic pain were all discussed with the patient and was willing to proceed.  Description of Procedure: The patient was correctly identified in the preoperative area and brought into the operating room.  The patient was placed supine with VTE prophylaxis in place.  Appropriate time-outs were performed.  Anesthesia was induced and the patient was intubated.  Appropriate antibiotics were infused.  The left groin and lower abdomen were prepped and draped in a sterile fashion. An oblique incision was made about 2 cm inferior to the usual inguinal hernia incision. Using electrocautery, the subcutaneous tissues were dissected, assuring adequate hemostasis.   The dissection plane was between the hernia sac and the inguinal ligament.  Then, the hernia sac itself was dissected and multiple layers of chronic scar tissue were dissected in order to finally reach the peritoneal sac surrounding the bowel.  This was entered and ascitic fluid was encountered, in addition to a small loop of small bowel.  The bowel itself was hyperemic, but there was no evidence of any necrosis or hemorrhagic changes.  I could not reduce the bowel through the hernia defect itself, and it was decided to dissect anteriorly through the defect towards the inguinal ligament.  This allowed enough space to be able to reduce the bowel without complication.  About 200 ml of ascitic fluid was suction through the defect.  Following that, the hernia sac was fully excised, and the stump was sutured with 2-0 Prolene purse string.  The stump was pushed back through the femoral defect, and the hernia defect was closed using 2-0 Prolene by approximating the inguinal ligament to Cooper's ligament.  Given the small bowel incarceration and ascites, it was decided to forgo any mesh placement. 50 ml of Exparel solution mixed with 0.25% bupivacaine with epi was infiltrated onto the fascia, subcutaneous tissue, and skin, as well as a left ilioinguinal block.  Given the size of the space left behind by the hernia sac, it was decided to place a 15 Fr. Blake drain.    The wound was irrigated and the incision was closed in three layers using 2-0 Vicryl for Scarpa's layer, 3-0 Vicryl for dermal layer, and staples for skin.  The drain was secured in place using 3-0 Nylon.  The wound was cleaned and the incision and drain were dressed  with 4x4 gauze and Tegaderm.  The patient was emerged from anesthesia and extubated and brought to the recovery room for further management.  The patient tolerated the procedure well and all counts were correct at the end of the case.   Melvyn Neth, MD

## 2020-07-12 NOTE — Progress Notes (Signed)
07/12/2020  Subjective: Patient is Day of Surgery s/p open left incarcerated femoral hernia repair overnight.  No acute events.  Reports some soreness in the left groin.  Denies any abdominal pain and reports having flatus last night and this morning as well.  Vital signs: Temp:  [97.2 F (36.2 C)-98.8 F (37.1 C)] 98 F (36.7 C) (03/13 1004) Pulse Rate:  [40-76] 72 (03/13 1004) Resp:  [9-28] 16 (03/13 1004) BP: (149-172)/(54-73) 153/60 (03/13 1004) SpO2:  [90 %-100 %] 98 % (03/13 1004) Weight:  [56.7 kg] 56.7 kg (03/12 1625)   Intake/Output: 03/12 0701 - 03/13 0700 In: 550 [I.V.:500; IV Piggyback:50] Out: 750 [Urine:310; Drains:20; Blood:20]    Physical Exam: Constitutional: No acute distress Abdomen:  Soft, non-distended, non-tender to palpation except some soreness at the left groin incision and drain sites.  Drain with serosanguinous fluid.  Labs:  Recent Labs    07/11/20 1636 07/12/20 0840  WBC 12.8* 10.6*  HGB 12.7 11.4*  HCT 39.9 36.0  PLT 285 239   Recent Labs    07/11/20 1636 07/12/20 0840  NA 135 137  K 4.7 4.3  CL 99 104  CO2 25 23  GLUCOSE 207* 196*  BUN 50* 51*  CREATININE 4.12* 3.93*  CALCIUM 9.9 9.1   No results for input(s): LABPROT, INR in the last 72 hours.  Imaging: CT ABDOMEN PELVIS WO CONTRAST  Result Date: 07/11/2020 CLINICAL DATA:  Left lower quadrant abdominal pain EXAM: CT ABDOMEN AND PELVIS WITHOUT CONTRAST TECHNIQUE: Multidetector CT imaging of the abdomen and pelvis was performed following the standard protocol without IV contrast. COMPARISON:  None. FINDINGS: Lower chest: Cardiomegaly. Extensive 3 vessel coronary artery calcifications and/or stents. Small pericardial effusion. Hepatobiliary: No solid liver abnormality is seen. Calcified gallstones in the gallbladder. Gallbladder wall thickening, or biliary dilatation. Pancreas: Unremarkable. No pancreatic ductal dilatation or surrounding inflammatory changes. Spleen: Normal in size  without significant abnormality. Adrenals/Urinary Tract: Adrenal glands are unremarkable. Kidneys are normal, without renal calculi, solid lesion, or hydronephrosis. Bladder is unremarkable. Stomach/Bowel: Stomach is within normal limits. There are numerous distended loops of small bowel in the mid abdomen, measuring up to 3.5 cm in caliber. There is an abrupt caliber change of the bowel distal to a left-sided inguinal hernia containing a single knuckled loop of ileum (series 2, image 73). Descending and sigmoid diverticulosis. Vascular/Lymphatic: Aortic atherosclerosis. No enlarged abdominal or pelvic lymph nodes. Reproductive: Status post hysterectomy. Other: Left-sided inguinal hernia containing obstructed appearing with bowel as detailed above. Small volume ascites throughout the abdomen and pelvis. Musculoskeletal: No acute or significant osseous findings. IMPRESSION: 1. There are numerous distended loops of small bowel in the mid abdomen, measuring up to 3.5 cm in caliber. There is an abrupt caliber change of the bowel distal to a left-sided inguinal hernia containing a single knuckled loop of ileum. Findings are consistent with small bowel obstruction. 2. Small volume ascites throughout the abdomen and pelvis, likely reactive. 3. Cholelithiasis without evidence of acute cholecystitis. 4. Cardiomegaly and coronary artery disease. 5. Small pericardial effusion. Aortic Atherosclerosis (ICD10-I70.0). Electronically Signed   By: Eddie Candle M.D.   On: 07/11/2020 18:34   DG Abdomen 1 View  Result Date: 07/11/2020 CLINICAL DATA:  Enteric catheter placement EXAM: ABDOMEN - 1 VIEW COMPARISON:  06/06/2019 FINDINGS: Frontal view of the lower chest and upper abdomen demonstrates enteric catheter tip and side port projecting over the distal thoracic esophagus. Recommend advancing at least 7.5 cm to ensure side port placement within the gastric  lumen. Chronic elevation of the right hemidiaphragm. Diffuse gaseous  distention of the small bowel concerning for obstruction. IMPRESSION: 1. Enteric catheter as above, tip and side port projecting over distal thoracic esophagus. Recommend advancing at least 7.5 cm to ensure side port placement within the gastric lumen. 2. Small bowel obstruction. Electronically Signed   By: Randa Ngo M.D.   On: 07/11/2020 20:52    Assessment/Plan: This is a 87 y.o. female s/p open left incarcerated femoral hernia repair.  --Discussed with patient the OR findings from overnight.  No bowel resection needed.   --Since she's reporting flatus this morning, will do NG clamping trial.  If residual less than 150 ml, can d/c NG tube and start clears. --D/C foley --PT consult placed.   Melvyn Neth, [facility]

## 2020-07-12 NOTE — Progress Notes (Signed)
PROGRESS NOTE    Lori Stanley  [id number] DOB: 1931/09/28 DOA: 07/11/2020 PCP: Jerrol Banana., MD   Chief complaint.  Abdominal pain Brief Narrative:  Lori Stanley is a 87 y.o. female with medical history significant for ESRD on HD (MW), chronic diastolic heart failure, diet-controlled type 2 diabetes, hypertension, and secondary hyperparathyroidism who presents with concerns of abdominal pain, persistent nausea, vomiting and diarrhea. CT scan of abdomen/pelvis showed left-sided inguinal hernia incarceration.  Patient had hernia repair on 3/13.   Assessment & Plan:   Principal Problem:   Incarcerated left inguinal hernia Active Problems:   Essential hypertension, malignant   Diabetes (HCC)   ESRD (end stage renal disease) (HCC)   SBO (small bowel obstruction) (HCC)   Chronic diastolic CHF (congestive heart failure) ([place])  #1.  Incarcerated left-sided inguinal hernia. Patient had nausea vomiting and abdominal pain.  She had a hernia repair today.  She is currently n.p.o. pending bowel recovery. Patient also has end-stage renal disease, I will start lower dose fluids with half-normal saline at 50 mL/h to keep her rehydrated.  #2.  End-stage renal disease. Patient has been seen by nephrology, dialysis per nephrology.  3.  Type 2 diabetes with hyperglycemia. Continue to follow as patient is n.p.o. lower dose sliding scale insulin.  4.  Chronic diastolic congestive heart failure. Stable, no evidence of volume overload.  4.  Essential hypertension. Continue to follow.    DVT prophylaxis: SCDs Code Status: Full Family Communication:  Disposition Plan:  .   Status is: Inpatient  Remains inpatient appropriate because:Hemodynamically unstable and Inpatient level of care appropriate due to severity of illness   Dispo: The patient is from: Home              Anticipated d/c is to: Home              Patient currently is not medically stable to d/c.    Difficult to place patient No        I/O last 3 completed shifts: In: 550 [I.V.:500; IV Piggyback:50] Out: 750 [Urine:310; Drains:20; Other:400; Blood:20] Total I/O In: -  Out: 25 [Urine:30; Drains:10]     Consultants:   GS  Procedures: Hernia repair.  Antimicrobials: None  Subjective: Patient status post hernia repair, currently doing well, no abdominal pain or nausea vomiting.  Has not passed gas, no bowel movement.  Currently n.p.o. Denies any short of breath or cough. No fever chills.  Objective: Vitals:   07/12/20 0800 07/12/20 0835 07/12/20 0907 07/12/20 1004  BP: (!) 162/64 (!) 163/61 (!) 149/55 (!) 153/60  Pulse: 72 71 69 72  Resp: (!) '23 16 16 16  '$ Temp: 98.8 F (37.1 C) 97.8 F (36.6 C) 98.1 F (36.7 C) 98 F (36.7 C)  TempSrc:  Oral Oral Oral  SpO2: 98% 100% 98% 98%  Weight:      Height:        Intake/Output Summary (Last 24 hours) at 07/12/2020 1219 Last data filed at 07/12/2020 0800 Gross per 24 hour  Intake 550 ml  Output 790 ml  Net -240 ml   Filed Weights   07/11/20 1625  Weight: 56.7 kg    Examination:  General exam: Appears calm and comfortable  Respiratory system: Clear to auscultation. Respiratory effort normal. Cardiovascular system: S1 & S2 heard, RRR. 2/6 SM at RUSB, with normal S2. No pedal edema. Gastrointestinal system: Abdomen is nondistended, soft and nontender. No organomegaly or masses felt.  No bowel sounds appreciated.  Central nervous system: Alert and oriented. No focal neurological deficits. Extremities: Symmetric 5 x 5 power. Skin: No rashes, lesions or ulcers Psychiatry: Judgement and insight appear normal. Mood & affect appropriate.     Data Reviewed: I have personally reviewed following labs and imaging studies  CBC: Recent Labs  Lab 07/11/20 1636 07/12/20 0840  WBC 12.8* 10.6*  HGB 12.7 11.4*  HCT 39.9 36.0  MCV 88.9 88.5  PLT 285 [id number]   Basic Metabolic Panel: Recent Labs  Lab 07/11/20 1636  07/12/20 0840  NA 135 137  K 4.7 4.3  CL 99 104  CO2 25 23  GLUCOSE 207* 196*  BUN 50* 51*  CREATININE 4.12* 3.93*  CALCIUM 9.9 9.1   GFR: Estimated Creatinine Clearance: 8.5 mL/min (A) (by C-G formula based on SCr of 3.93 mg/dL (H)). Liver Function Tests: Recent Labs  Lab 07/11/20 1636  AST 23  ALT 17  ALKPHOS 92  BILITOT 0.9  PROT 7.5  ALBUMIN 4.2   Recent Labs  Lab 07/11/20 1636  LIPASE 26   No results for input(s): AMMONIA in the last 168 hours. Coagulation Profile: No results for input(s): INR, PROTIME in the last 168 hours. Cardiac Enzymes: No results for input(s): CKTOTAL, CKMB, CKMBINDEX, TROPONINI in the last 168 hours. BNP (last 3 results) No results for input(s): PROBNP in the last 8760 hours. HbA1C: No results for input(s): HGBA1C in the last 72 hours. CBG: Recent Labs  Lab 07/12/20 0457 07/12/20 0904 07/12/20 1152  GLUCAP 189* 178* 186*   Lipid Profile: No results for input(s): CHOL, HDL, LDLCALC, TRIG, CHOLHDL, LDLDIRECT in the last 72 hours. Thyroid Function Tests: No results for input(s): TSH, T4TOTAL, FREET4, T3FREE, THYROIDAB in the last 72 hours. Anemia Panel: No results for input(s): VITAMINB12, FOLATE, FERRITIN, TIBC, IRON, RETICCTPCT in the last 72 hours. Sepsis Labs: No results for input(s): PROCALCITON, LATICACIDVEN in the last 168 hours.  Recent Results (from the past 240 hour(s))  Resp Panel by RT-PCR (Flu A&B, Covid) Nasopharyngeal Swab     Status: None   Collection Time: 07/11/20  6:00 PM   Specimen: Nasopharyngeal Swab; Nasopharyngeal(NP) swabs in vial transport medium  Result Value Ref Range Status   SARS Coronavirus 2 by RT PCR NEGATIVE NEGATIVE Final    Comment: (NOTE) SARS-CoV-2 target nucleic acids are NOT DETECTED.  The SARS-CoV-2 RNA is generally detectable in upper respiratory specimens during the acute phase of infection. The lowest concentration of SARS-CoV-2 viral copies this assay can detect is 138 copies/mL.  A negative result does not preclude SARS-Cov-2 infection and should not be used as the sole basis for treatment or other patient management decisions. A negative result may occur with  improper specimen collection/handling, submission of specimen other than nasopharyngeal swab, presence of viral mutation(s) within the areas targeted by this assay, and inadequate number of viral copies(<138 copies/mL). A negative result must be combined with clinical observations, patient history, and epidemiological information. The expected result is Negative.  Fact Sheet for Patients:  EntrepreneurPulse.com.au  Fact Sheet for Healthcare Providers:  IncredibleEmployment.be  This test is no t yet approved or cleared by the [place] FDA and  has been authorized for detection and/or diagnosis of SARS-CoV-2 by FDA under an Emergency Use Authorization (EUA). This EUA will remain  in effect (meaning this test can be used) for the duration of the COVID-19 declaration under Section 564(b)(1) of the Act, 21 U.S.C.section 360bbb-3(b)(1), unless the authorization is terminated  or revoked sooner.  Influenza A by PCR NEGATIVE NEGATIVE Final   Influenza B by PCR NEGATIVE NEGATIVE Final    Comment: (NOTE) The Xpert Xpress SARS-CoV-2/FLU/RSV plus assay is intended as an aid in the diagnosis of influenza from Nasopharyngeal swab specimens and should not be used as a sole basis for treatment. Nasal washings and aspirates are unacceptable for Xpert Xpress SARS-CoV-2/FLU/RSV testing.  Fact Sheet for Patients: EntrepreneurPulse.com.au  Fact Sheet for Healthcare Providers: IncredibleEmployment.be  This test is not yet approved or cleared by the [place] FDA and has been authorized for detection and/or diagnosis of SARS-CoV-2 by FDA under an Emergency Use Authorization (EUA). This EUA will remain in effect (meaning this test  can be used) for the duration of the COVID-19 declaration under Section 564(b)(1) of the Act, 21 U.S.C. section 360bbb-3(b)(1), unless the authorization is terminated or revoked.  Performed at [facility], [address] [place], [place] [address]          Radiology Studies: CT ABDOMEN PELVIS WO CONTRAST  Result Date: 07/11/2020 CLINICAL DATA:  Left lower quadrant abdominal pain EXAM: CT ABDOMEN AND PELVIS WITHOUT CONTRAST TECHNIQUE: Multidetector CT imaging of the abdomen and pelvis was performed following the standard protocol without IV contrast. COMPARISON:  None. FINDINGS: Lower chest: Cardiomegaly. Extensive 3 vessel coronary artery calcifications and/or stents. Small pericardial effusion. Hepatobiliary: No solid liver abnormality is seen. Calcified gallstones in the gallbladder. Gallbladder wall thickening, or biliary dilatation. Pancreas: Unremarkable. No pancreatic ductal dilatation or surrounding inflammatory changes. Spleen: Normal in size without significant abnormality. Adrenals/Urinary Tract: Adrenal glands are unremarkable. Kidneys are normal, without renal calculi, solid lesion, or hydronephrosis. Bladder is unremarkable. Stomach/Bowel: Stomach is within normal limits. There are numerous distended loops of small bowel in the mid abdomen, measuring up to 3.5 cm in caliber. There is an abrupt caliber change of the bowel distal to a left-sided inguinal hernia containing a single knuckled loop of ileum (series 2, image 73). Descending and sigmoid diverticulosis. Vascular/Lymphatic: Aortic atherosclerosis. No enlarged abdominal or pelvic lymph nodes. Reproductive: Status post hysterectomy. Other: Left-sided inguinal hernia containing obstructed appearing with bowel as detailed above. Small volume ascites throughout the abdomen and pelvis. Musculoskeletal: No acute or significant osseous findings. IMPRESSION: 1. There are numerous distended loops of small bowel in the mid  abdomen, measuring up to 3.5 cm in caliber. There is an abrupt caliber change of the bowel distal to a left-sided inguinal hernia containing a single knuckled loop of ileum. Findings are consistent with small bowel obstruction. 2. Small volume ascites throughout the abdomen and pelvis, likely reactive. 3. Cholelithiasis without evidence of acute cholecystitis. 4. Cardiomegaly and coronary artery disease. 5. Small pericardial effusion. Aortic Atherosclerosis (ICD10-I70.0). Electronically Signed   By: Eddie Candle M.D.   On: 07/11/2020 18:34   DG Abdomen 1 View  Result Date: 07/11/2020 CLINICAL DATA:  Enteric catheter placement EXAM: ABDOMEN - 1 VIEW COMPARISON:  06/06/2019 FINDINGS: Frontal view of the lower chest and upper abdomen demonstrates enteric catheter tip and side port projecting over the distal thoracic esophagus. Recommend advancing at least 7.5 cm to ensure side port placement within the gastric lumen. Chronic elevation of the right hemidiaphragm. Diffuse gaseous distention of the small bowel concerning for obstruction. IMPRESSION: 1. Enteric catheter as above, tip and side port projecting over distal thoracic esophagus. Recommend advancing at least 7.5 cm to ensure side port placement within the gastric lumen. 2. Small bowel obstruction. Electronically Signed   By: Randa Ngo M.D.   On: 07/11/2020  20:52        Scheduled Meds: . insulin aspart  0-6 Units Subcutaneous TID WC   Continuous Infusions: . dextrose 5 % and 0.45% NaCl    . piperacillin-tazobactam (ZOSYN)  IV Stopped (07/12/20 0011)     LOS: 1 day    Time spent: 28 minutes    Sharen Hones, MD [facility]   To contact the attending provider between 7A-7P or the covering provider during after hours 7P-7A, please log into the web site [email or web address] and access using universal [place] password for that web site. If you do not have the password, please call the hospital operator.  07/12/2020, 12:19 PM

## 2020-07-12 NOTE — Brief Op Note (Signed)
i3/13/2022  4:48 AM  PATIENT:  Lori Stanley  87 y.o. female  PRE-OPERATIVE DIAGNOSIS:  incarcerated left femoral hernia  POST-OPERATIVE DIAGNOSIS:  incarcerated left femoral hernia  PROCEDURE:  Procedure(s): HERNIA REPAIR LEFT FEMORAL ADULT (N/A)  SURGEON:  Surgeon(s) and Role:    * Shritha Bresee, MD - Primary   ANESTHESIA:   general  EBL:  10 ml   BLOOD ADMINISTERED:none  DRAINS: (15 Fr) Blake drain(s) in the left groin   LOCAL MEDICATIONS USED:  BUPIVICAINE   SPECIMEN:  Source of Specimen:  hernia sac  DISPOSITION OF SPECIMEN:  PATHOLOGY  COUNTS:  YES  TOURNIQUET:  * No tourniquets in log *  DICTATION: .Dragon Dictation  PLAN OF CARE: Admit to inpatient   PATIENT DISPOSITION:  PACU - hemodynamically stable.   Delay start of Pharmacological VTE agent (>24hrs) due to surgical blood loss or risk of bleeding: yes

## 2020-07-12 NOTE — Transfer of Care (Signed)
Immediate Anesthesia Transfer of Care Note  Patient: Lori Stanley [facility]  Procedure(s) Performed: HERNIA REPAIR INGUINAL ADULT (N/A )  Patient Location: PACU  Anesthesia Type:General  Level of Consciousness: awake, alert  and oriented  Airway & Oxygen Therapy: Patient Spontanous Breathing and Patient connected to face mask oxygen  Post-op Assessment: Report given to RN and Post -op Vital signs reviewed and stable  Post vital signs: Reviewed and stable  Last Vitals:  Vitals Value Taken Time  BP 170/61 07/12/20 0446  Temp    Pulse 78 07/12/20 0451  Resp 19 07/12/20 0451  SpO2 100 % 07/12/20 0451  Vitals shown include unvalidated device data.  Last Pain:  Vitals:   07/11/20 1624  TempSrc:   PainSc: 0-No pain         Complications: No complications documented.

## 2020-07-12 NOTE — Anesthesia Preprocedure Evaluation (Signed)
Anesthesia Evaluation  Patient identified by MRN, date of birth, ID band Patient awake    Reviewed: Allergy & Precautions, NPO status , Patient's Chart, lab work & pertinent test results  History of Anesthesia Complications Negative for: history of anesthetic complications  Airway Mallampati: III       Dental  (+) Poor Dentition, Missing, Chipped, Loose   Pulmonary neg sleep apnea, COPD,  oxygen dependent, Not current smoker, former smoker,           Cardiovascular hypertension, Pt. on medications + Past MI (5 yrs ago) and +CHF  (-) dysrhythmias (-) Valvular Problems/Murmurs     Neuro/Psych neg Seizures Anxiety Depression    GI/Hepatic Neg liver ROS, GERD  Medicated and Controlled,  Endo/Other  diabetes, Type 2, Oral Hypoglycemic Agents  Renal/GU ESRF and DialysisRenal disease     Musculoskeletal   Abdominal   Peds  Hematology   Anesthesia Other Findings   Reproductive/Obstetrics                             Anesthesia Physical Anesthesia Plan  ASA: IV and emergent  Anesthesia Plan: General   Post-op Pain Management:    Induction: Intravenous  PONV Risk Score and Plan: 3 and Ondansetron, Dexamethasone and Treatment may vary due to age or medical condition  Airway Management Planned: Oral ETT  Additional Equipment:   Intra-op Plan:   Post-operative Plan:   Informed Consent: I have reviewed the patients History and Physical, chart, labs and discussed the procedure including the risks, benefits and alternatives for the proposed anesthesia with the patient or authorized representative who has indicated his/her understanding and acceptance.       Plan Discussed with:   Anesthesia Plan Comments:         Anesthesia Quick Evaluation

## 2020-07-12 NOTE — Anesthesia Procedure Notes (Signed)
Procedure Name: Intubation Date/Time: 07/12/2020 1:19 AM Performed by: Jerrye Noble, CRNA Pre-anesthesia Checklist: Patient identified, Emergency Drugs available, Suction available and Patient being monitored Patient Re-evaluated:Patient Re-evaluated prior to induction Oxygen Delivery Method: Circle system utilized Preoxygenation: Pre-oxygenation with 100% oxygen Induction Type: IV induction, Rapid sequence and Cricoid Pressure applied Laryngoscope Size: McGraph and 3 Grade View: Grade I Tube type: Oral Tube size: 7.0 mm Number of attempts: 1 Airway Equipment and Method: Stylet,  Oral airway and Video-laryngoscopy Placement Confirmation: ETT inserted through vocal cords under direct vision,  positive ETCO2 and breath sounds checked- equal and bilateral Secured at: 22 cm Tube secured with: Tape Dental Injury: Teeth and Oropharynx as per pre-operative assessment

## 2020-07-13 ENCOUNTER — Encounter: Payer: Self-pay | Admitting: Surgery

## 2020-07-13 DIAGNOSIS — K4030 Unilateral inguinal hernia, with obstruction, without gangrene, not specified as recurrent: Secondary | ICD-10-CM | POA: Diagnosis not present

## 2020-07-13 DIAGNOSIS — N186 End stage renal disease: Secondary | ICD-10-CM | POA: Diagnosis not present

## 2020-07-13 DIAGNOSIS — I10 Essential (primary) hypertension: Secondary | ICD-10-CM | POA: Diagnosis not present

## 2020-07-13 LAB — CBC WITH DIFFERENTIAL/PLATELET
Abs Immature Granulocytes: 0.05 10*3/uL (ref 0.00–0.07)
Basophils Absolute: 0 10*3/uL (ref 0.0–0.1)
Basophils Relative: 0 %
Eosinophils Absolute: 0 10*3/uL (ref 0.0–0.5)
Eosinophils Relative: 0 %
HCT: 32.7 % — ABNORMAL LOW (ref 36.0–46.0)
Hemoglobin: 10.7 g/dL — ABNORMAL LOW (ref 12.0–15.0)
Immature Granulocytes: 1 %
Lymphocytes Relative: 11 %
Lymphs Abs: 1 10*3/uL (ref 0.7–4.0)
MCH: 28.9 pg (ref 26.0–34.0)
MCHC: 32.7 g/dL (ref 30.0–36.0)
MCV: 88.4 fL (ref 80.0–100.0)
Monocytes Absolute: 0.7 10*3/uL (ref 0.1–1.0)
Monocytes Relative: 7 %
Neutro Abs: 7.4 10*3/uL (ref 1.7–7.7)
Neutrophils Relative %: 81 %
Platelets: 224 10*3/uL (ref 150–400)
RBC: 3.7 MIL/uL — ABNORMAL LOW (ref 3.87–5.11)
RDW: 14 % (ref 11.5–15.5)
WBC: 9.1 10*3/uL (ref 4.0–10.5)
nRBC: 0 % (ref 0.0–0.2)

## 2020-07-13 LAB — GLUCOSE, CAPILLARY
Glucose-Capillary: 80 mg/dL (ref 70–99)
Glucose-Capillary: 157 mg/dL — ABNORMAL HIGH (ref 70–99)
Glucose-Capillary: 116 mg/dL — ABNORMAL HIGH (ref 70–99)

## 2020-07-13 LAB — BASIC METABOLIC PANEL
Anion gap: 7 (ref 5–15)
BUN: 57 mg/dL — ABNORMAL HIGH (ref 8–23)
CO2: 25 mmol/L (ref 22–32)
Calcium: 8.7 mg/dL — ABNORMAL LOW (ref 8.9–10.3)
Chloride: 105 mmol/L (ref 98–111)
Creatinine, Ser: 3.85 mg/dL — ABNORMAL HIGH (ref 0.44–1.00)
GFR, Estimated: 11 mL/min — ABNORMAL LOW (ref >60)
Glucose, Bld: 96 mg/dL (ref 70–99)
Potassium: 3.7 mmol/L (ref 3.5–5.1)
Sodium: 137 mmol/L (ref 135–145)

## 2020-07-13 LAB — MAGNESIUM: Magnesium: 2.1 mg/dL (ref 1.7–2.4)

## 2020-07-13 MED ORDER — HYDROXYZINE HCL 25 MG PO TABS
25.0000 mg | ORAL_TABLET | Freq: Four times a day (QID) | ORAL | Status: DC | PRN
  Administered 2020-07-13: 25 mg via ORAL
  Filled 2020-07-13: qty 1

## 2020-07-13 MED ORDER — GUAIFENESIN-DM 100-10 MG/5ML PO SYRP
5.0000 mL | ORAL_SOLUTION | ORAL | Status: DC | PRN
  Administered 2020-07-13: 5 mL via ORAL
  Filled 2020-07-13: qty 5

## 2020-07-13 MED ORDER — ACETAMINOPHEN 325 MG PO TABS
650.0000 mg | ORAL_TABLET | Freq: Four times a day (QID) | ORAL | Status: DC | PRN
  Administered 2020-07-13: 650 mg via ORAL
  Filled 2020-07-13: qty 2

## 2020-07-13 MED ORDER — CHLORHEXIDINE GLUCONATE CLOTH 2 % EX PADS: 6.0000 | MEDICATED_PAD | Freq: Every day | CUTANEOUS | Status: DC

## 2020-07-13 MED ORDER — CARVEDILOL 12.5 MG PO TABS
12.5000 mg | ORAL_TABLET | Freq: Two times a day (BID) | ORAL | Status: DC
  Administered 2020-07-13: 12.5 mg via ORAL
  Filled 2020-07-13 (×2): qty 1

## 2020-07-13 MED ORDER — AMLODIPINE BESYLATE 5 MG PO TABS
5.0000 mg | ORAL_TABLET | Freq: Every day | ORAL | Status: DC
  Administered 2020-07-13: 5 mg via ORAL
  Filled 2020-07-13: qty 1

## 2020-07-13 NOTE — Progress Notes (Signed)
[facility]  ROUNDING NOTE   Subjective:   Lori Stanley is a 87 y.o. female with past medical history of ESRD on HD, diastolic chronic heart failure, diet controlled type 2 diabetes, hypertension and secondary hyperparathyroidism.  She presented to the ED with nausea, vomiting and diarrhea.   She was admitted for left inguinal hernia and had a repair on 07/12/20  Patient seen resting in bed Alert and oriented Says she has some soreness from procedure Able to tolerate meals Denies nausea Denies shortness of breath  JP drain-33m   Objective:  Vital signs in last 24 hours:  Temp:  [98.2 F (36.8 C)-98.8 F (37.1 C)] 98.4 F (36.9 C) (03/14 1100) Pulse Rate:  [65-69] 68 (03/14 1100) Resp:  [20] 20 (03/14 1100) BP: (152-166)/(50-64) 160/64 (03/14 1100) SpO2:  [91 %-95 %] 92 % (03/14 1100)  Weight change:  Filed Weights   07/11/20 1625  Weight: 56.7 kg    Intake/Output: I/O last 3 completed shifts: In: 1808.4 [P.O.:780; I.V.:958.4; IV Piggyback:70] Out: 840 [Urine:340; Drains:80; Other:400; Blood:20]   Intake/Output this shift:  Total I/O In: 600 [P.O.:600] Out: -   Physical Exam: General: NAD, laying in bed  Head: Normocephalic, atraumatic. Moist oral mucosal membranes  Eyes: Anicteric, PERRL  Neck: Supple, trachea midline  Lungs:  Clear to auscultation  Heart: Regular rate and rhythm  Abdomen:  Soft, nontender, left groin surgical dressing, JP drain  Extremities:  no peripheral edema.  Neurologic: Nonfocal, moving all four extremities  Skin: No lesions  Access: LUE AVG    Basic Metabolic Panel: Recent Labs  Lab 07/11/20 1636 07/12/20 0840 07/13/20 0635  NA 135 137 137  K 4.7 4.3 3.7  CL 99 104 105  CO2 '25 23 25  '$ GLUCOSE 207* 196* 96  BUN 50* 51* 57*  CREATININE 4.12* 3.93* 3.85*  CALCIUM 9.9 9.1 8.7*  MG  --   --  2.1    Liver Function Tests: Recent Labs  Lab 07/11/20 1636  AST 23  ALT 17  ALKPHOS 92  BILITOT 0.9  PROT  7.5  ALBUMIN 4.2   Recent Labs  Lab 07/11/20 1636  LIPASE 26   No results for input(s): AMMONIA in the last 168 hours.  CBC: Recent Labs  Lab 07/11/20 1636 07/12/20 0840 07/13/20 0635  WBC 12.8* 10.6* 9.1  NEUTROABS  --   --  7.4  HGB 12.7 11.4* 10.7*  HCT 39.9 36.0 32.7*  MCV 88.9 88.5 88.4  PLT 285 239 224    Cardiac Enzymes: No results for input(s): CKTOTAL, CKMB, CKMBINDEX, TROPONINI in the last 168 hours.  BNP: Invalid input(s): POCBNP  CBG: Recent Labs  Lab 07/12/20 1152 07/12/20 1656 07/12/20 2039 07/13/20 0733 07/13/20 1144  GLUCAP 186* 99 189* 80 157*    Microbiology: Results for orders placed or performed during the hospital encounter of 07/11/20  Resp Panel by RT-PCR (Flu A&B, Covid) Nasopharyngeal Swab     Status: None   Collection Time: 07/11/20  6:00 PM   Specimen: Nasopharyngeal Swab; Nasopharyngeal(NP) swabs in vial transport medium  Result Value Ref Range Status   SARS Coronavirus 2 by RT PCR NEGATIVE NEGATIVE Final    Comment: (NOTE) SARS-CoV-2 target nucleic acids are NOT DETECTED.  The SARS-CoV-2 RNA is generally detectable in upper respiratory specimens during the acute phase of infection. The lowest concentration of SARS-CoV-2 viral copies this assay can detect is 138 copies/mL. A negative result does not preclude SARS-Cov-2 infection and should not be used  as the sole basis for treatment or other patient management decisions. A negative result may occur with  improper specimen collection/handling, submission of specimen other than nasopharyngeal swab, presence of viral mutation(s) within the areas targeted by this assay, and inadequate number of viral copies(<138 copies/mL). A negative result must be combined with clinical observations, patient history, and epidemiological information. The expected result is Negative.  Fact Sheet for Patients:  EntrepreneurPulse.com.au  Fact Sheet for Healthcare Providers:   IncredibleEmployment.be  This test is no t yet approved or cleared by the [place] FDA and  has been authorized for detection and/or diagnosis of SARS-CoV-2 by FDA under an Emergency Use Authorization (EUA). This EUA will remain  in effect (meaning this test can be used) for the duration of the COVID-19 declaration under Section 564(b)(1) of the Act, 21 U.S.C.section 360bbb-3(b)(1), unless the authorization is terminated  or revoked sooner.       Influenza A by PCR NEGATIVE NEGATIVE Final   Influenza B by PCR NEGATIVE NEGATIVE Final    Comment: (NOTE) The Xpert Xpress SARS-CoV-2/FLU/RSV plus assay is intended as an aid in the diagnosis of influenza from Nasopharyngeal swab specimens and should not be used as a sole basis for treatment. Nasal washings and aspirates are unacceptable for Xpert Xpress SARS-CoV-2/FLU/RSV testing.  Fact Sheet for Patients: EntrepreneurPulse.com.au  Fact Sheet for Healthcare Providers: IncredibleEmployment.be  This test is not yet approved or cleared by the [place] FDA and has been authorized for detection and/or diagnosis of SARS-CoV-2 by FDA under an Emergency Use Authorization (EUA). This EUA will remain in effect (meaning this test can be used) for the duration of the COVID-19 declaration under Section 564(b)(1) of the Act, 21 U.S.C. section 360bbb-3(b)(1), unless the authorization is terminated or revoked.  Performed at [facility], [address] [place], [place] [address]     Coagulation Studies: No results for input(s): LABPROT, INR in the last 72 hours.  Urinalysis: No results for input(s): COLORURINE, LABSPEC, PHURINE, GLUCOSEU, HGBUR, BILIRUBINUR, KETONESUR, PROTEINUR, UROBILINOGEN, NITRITE, LEUKOCYTESUR in the last 72 hours.  Invalid input(s): APPERANCEUR    Imaging: CT ABDOMEN PELVIS WO CONTRAST  Result Date: 07/11/2020 CLINICAL DATA:  Left lower  quadrant abdominal pain EXAM: CT ABDOMEN AND PELVIS WITHOUT CONTRAST TECHNIQUE: Multidetector CT imaging of the abdomen and pelvis was performed following the standard protocol without IV contrast. COMPARISON:  None. FINDINGS: Lower chest: Cardiomegaly. Extensive 3 vessel coronary artery calcifications and/or stents. Small pericardial effusion. Hepatobiliary: No solid liver abnormality is seen. Calcified gallstones in the gallbladder. Gallbladder wall thickening, or biliary dilatation. Pancreas: Unremarkable. No pancreatic ductal dilatation or surrounding inflammatory changes. Spleen: Normal in size without significant abnormality. Adrenals/Urinary Tract: Adrenal glands are unremarkable. Kidneys are normal, without renal calculi, solid lesion, or hydronephrosis. Bladder is unremarkable. Stomach/Bowel: Stomach is within normal limits. There are numerous distended loops of small bowel in the mid abdomen, measuring up to 3.5 cm in caliber. There is an abrupt caliber change of the bowel distal to a left-sided inguinal hernia containing a single knuckled loop of ileum (series 2, image 73). Descending and sigmoid diverticulosis. Vascular/Lymphatic: Aortic atherosclerosis. No enlarged abdominal or pelvic lymph nodes. Reproductive: Status post hysterectomy. Other: Left-sided inguinal hernia containing obstructed appearing with bowel as detailed above. Small volume ascites throughout the abdomen and pelvis. Musculoskeletal: No acute or significant osseous findings. IMPRESSION: 1. There are numerous distended loops of small bowel in the mid abdomen, measuring up to 3.5 cm in caliber. There is an abrupt caliber  change of the bowel distal to a left-sided inguinal hernia containing a single knuckled loop of ileum. Findings are consistent with small bowel obstruction. 2. Small volume ascites throughout the abdomen and pelvis, likely reactive. 3. Cholelithiasis without evidence of acute cholecystitis. 4. Cardiomegaly and coronary  artery disease. 5. Small pericardial effusion. Aortic Atherosclerosis (ICD10-I70.0). Electronically Signed   By: Eddie Candle M.D.   On: 07/11/2020 18:34   DG Abdomen 1 View  Result Date: 07/11/2020 CLINICAL DATA:  Enteric catheter placement EXAM: ABDOMEN - 1 VIEW COMPARISON:  06/06/2019 FINDINGS: Frontal view of the lower chest and upper abdomen demonstrates enteric catheter tip and side port projecting over the distal thoracic esophagus. Recommend advancing at least 7.5 cm to ensure side port placement within the gastric lumen. Chronic elevation of the right hemidiaphragm. Diffuse gaseous distention of the small bowel concerning for obstruction. IMPRESSION: 1. Enteric catheter as above, tip and side port projecting over distal thoracic esophagus. Recommend advancing at least 7.5 cm to ensure side port placement within the gastric lumen. 2. Small bowel obstruction. Electronically Signed   By: Randa Ngo M.D.   On: 07/11/2020 20:52     Medications:   . piperacillin-tazobactam (ZOSYN)  IV 2.25 g (07/13/20 0506)   . amLODipine  5 mg Oral Daily  . carvedilol  12.5 mg Oral BID WC  . insulin aspart  0-6 Units Subcutaneous TID WC   acetaminophen, fentaNYL (SUBLIMAZE) injection, guaiFENesin-dextromethorphan, morphine injection, ondansetron (ZOFRAN) IV  Assessment/ Plan:  Ms. JENNAVEVE SALTERS is a 87 y.o.  female with past medical history of ESRD on HD, diastolic chronic heart failure, diet controlled type 2 diabetes, hypertension and secondary hyperparathyroidism.  Past Medical History:  Diagnosis Date  . Acute heart failure ([place])   . Anemia   . Anxiety   . CHF (congestive heart failure) ([place])   . Chronic kidney disease    stage V; end stage renal disease  . Chronic low back pain   . Depression   . Diabetes mellitus without complication ([place])   . GERD (gastroesophageal reflux disease)   . Hyperlipidemia   . Hypertension   . MI (myocardial infarction) ([place])   . Obesity     [place]/MF/LUE AVG  # End Stage Renal Disease on hemodialysis:MF Scheduled for dialysis tomorrow No acute need for dialysis today  # Anemia of chronic kidney disease Hemoglobin 10.7.  Lab Results  Component Value Date   HGB 10.7 (L) 07/13/2020  Will continue to monitor  # Secondary Hyperparathyroidism: Binders not needed at this time Lab Results  Component Value Date   PTH 148 (H) 06/07/2019   CALCIUM 8.7 (L) 07/13/2020   CAION 1.35 11/13/2019   PHOS 4.0 06/12/2019      LOS: 2 Waqas Bruhl 3/14/20223:06 PM

## 2020-07-13 NOTE — Progress Notes (Signed)
[facility] Day(s): 2.   Post op day(s): 1 Day Post-Op.   Interval History:  Patient seen and examined No acute events or new complaints overnight.  Patient reports she has LLQ soreness but overall feeling good No fever, chills, nausea, emesis Previously seen leukocytosis now resolved; WBC 9.1K Hgb 10.7; suspect degree of dilution BMP consistent with degree of renal failure No significant electrolyte derangements Surgical drain with 60 ccs out; serosanguinous Currently on CLD; tolerating well She continues to pass flatus and having BM Worked with PT; recommending HHPT  Vital signs in last 24 hours: [min-max] current  Temp:  [97.8 F (36.6 C)-98.8 F (37.1 C)] 98.4 F (36.9 C) (03/14 0432) Pulse Rate:  [64-72] 69 (03/14 0432) Resp:  [16-23] 20 (03/14 0432) BP: (149-166)/(50-65) 166/63 (03/14 0432) SpO2:  [91 %-100 %] 91 % (03/14 0432)     Height: '5\' 3"'$  (160 cm) Weight: 56.7 kg BMI (Calculated): 22.15   Intake/Output last 2 shifts:  03/13 0701 - 03/14 0700 In: 1258.4 [P.O.:780; I.V.:458.4; IV Piggyback:20] Out: 90 [Urine:30; Drains:60]   Physical Exam:  Constitutional: alert, cooperative and no distress  Respiratory: breathing non-labored at rest  Cardiovascular: regular rate and sinus rhythm, she does have a systolic murmur Gastrointestinal: Soft, incisional soreness in the LLQ/Left Groin, non-distended, no rebound/guarding, surgical drain in left groin with serosanguinous drainage  Integumentary: Incision in left groin with dressing in place  Labs:  CBC Latest Ref Rng & Units 07/13/2020 07/12/2020 07/11/2020  WBC 4.0 - 10.5 K/uL 9.1 10.6(H) 12.8(H)  Hemoglobin 12.0 - 15.0 g/dL 10.7(L) 11.4(L) 12.7  Hematocrit 36.0 - 46.0 % 32.7(L) 36.0 39.9  Platelets 150 - 400 K/uL 224 239 285   CMP Latest Ref Rng & Units 07/12/2020 07/11/2020 11/13/2019  Glucose 70 - 99 mg/dL 196(H) 207(H) 84  BUN 8 - 23 mg/dL 51(H) 50(H) 32(H)   Creatinine 0.44 - 1.00 mg/dL 3.93(H) 4.12(H) 3.20(H)  Sodium 135 - 145 mmol/L 137 135 140  Potassium 3.5 - 5.1 mmol/L 4.3 4.7 3.8  Chloride 98 - 111 mmol/L 104 99 104  CO2 22 - 32 mmol/L 23 25 -  Calcium 8.9 - 10.3 mg/dL 9.1 9.9 -  Total Protein 6.5 - 8.1 g/dL - 7.5 -  Total Bilirubin 0.3 - 1.2 mg/dL - 0.9 -  Alkaline Phos 38 - 126 U/L - 92 -  AST 15 - 41 U/L - 23 -  ALT 0 - 44 U/L - 17 -     Imaging studies: No new pertinent imaging studies   Assessment/Plan:  87 y.o. female with return of bowel function and overall doing well 1 Day Post-Op s/p Open Left Incarcerated Femoral Hernia Repair.   - Will advance to soft diet  - Wean from IVF  - Monitor abdominal examination; on-going bowel function  - Maintain drain; monitor and record output daily   - Pain control prn; antiemetics prn  - Continue dialysis as scheduled; MWF   - Mobilize with therapies; recommending HH   - Further management per primary service; we will follow    - Discharge Planning; Pending advancement of diet, likely okay to DC tomorrow morning  All of the above findings and recommendations were discussed with the patient, and the medical team, and all of patient's questions were answered to her expressed satisfaction.  -- Edison Simon, PA-C [facility] 07/13/2020, 7:11 AM [phone] M-F: 7am - 4pm

## 2020-07-13 NOTE — Progress Notes (Signed)
PROGRESS NOTE    Lori Stanley  [id number] DOB: 1932/04/15 DOA: 07/11/2020 PCP: Jerrol Banana., MD   Chief Complaint abdominal pain. Brief Narrative:  Lori Birc[place]tt [place] 87 y.o.femalewith medical history significant forESRD on HD (MW), chronic diastolic heart failure, diet-controlled type 2 diabetes, hypertension, and secondary hyperparathyroidism who presents with concerns of abdominal pain, persistent nausea, vomiting and diarrhea. CT scan of abdomen/pelvis showed left-sided inguinal hernia incarceration.  Patient had hernia repair on 3/13.   Assessment & Plan:   Principal Problem:   Incarcerated left inguinal hernia Active Problems:   Essential hypertension, malignant   Diabetes (HCC)   ESRD (end stage renal disease) (HCC)   SBO (small bowel obstruction) (HCC)   Chronic diastolic CHF (congestive heart failure) ([place])  #1.  Incarcerated left-sided inguinal hernia.  Status post hernia repair. Patient has not passed gas, had a small bowel movement.  General surgery has started diet, I will discontinue IV fluids. Patient also placed on Zosyn per general surgery.  2.  End-stage renal disease. Patient be dialyzed today.  3.  Type 2 diabetes. Continue current regimen.  4.  Essential hypertension. Resume Coreg and Norvasc.   DVT prophylaxis: SCDs Code Status: Full Family Communication:  Disposition Plan:  .   Status is: Inpatient  Remains inpatient appropriate because:Inpatient level of care appropriate due to severity of illness   Dispo: The patient is from: Home              Anticipated d/c is to: Home              Patient currently is not medically stable to d/c.   Difficult to place patient No        I/O last 3 completed shifts: In: 1808.4 [P.O.:780; I.V.:958.4; IV Piggyback:70] Out: 840 [Urine:340; Drains:80; Other:400; Blood:20] Total I/O In: 29 [P.O.:480] Out: -      Consultants:   GS  Procedures: Inguinal hernia  repair.  Antimicrobials:  Zosyn  Subjective: Patient doing well today, she passed gas multiple times, she had a small bowel movement.  General surgery starting diet. Denies any short of breath or cough. No abdominal pain or nausea vomiting.  Objective: Vitals:   07/12/20 1732 07/12/20 2026 07/12/20 2316 07/13/20 0432  BP: (!) 155/57 (!) 157/50 (!) 152/59 (!) 166/63  Pulse: 69 65 66 69  Resp: '20 20 20 20  '$ Temp: 98.2 F (36.8 C) 98.8 F (37.1 C) 98.6 F (37 C) 98.4 F (36.9 C)  TempSrc: Oral Oral Oral Oral  SpO2: 94% 92% 95% 91%  Weight:      Height:        Intake/Output Summary (Last 24 hours) at 07/13/2020 1046 Last data filed at 07/13/2020 1004 Gross per 24 hour  Intake 1738.36 ml  Output 50 ml  Net 1688.36 ml   Filed Weights   07/11/20 1625  Weight: 56.7 kg    Examination:  General exam: Appears calm and comfortable  Respiratory system: Clear to auscultation. Respiratory effort normal. Cardiovascular system: S1 & S2 heard, RRR. No JVD, murmurs, rubs, gallops or clicks. No pedal edema. Gastrointestinal system: Abdomen is nondistended, soft and nontender. No organomegaly or masses felt. Normal bowel sounds heard. Central nervous system: Alert and oriented. No focal neurological deficits. Extremities: Symmetric 5 x 5 power. Skin: No rashes, lesions or ulcers Psychiatry: Judgement and insight appear normal. Mood & affect appropriate.     Data Reviewed: I have personally reviewed following labs and imaging studies  CBC: Recent  Labs  Lab 07/11/20 1636 07/12/20 0840 07/13/20 0635  WBC 12.8* 10.6* 9.1  NEUTROABS  --   --  7.4  HGB 12.7 11.4* 10.7*  HCT 39.9 36.0 32.7*  MCV 88.9 88.5 88.4  PLT 285 239 [id number]   Basic Metabolic Panel: Recent Labs  Lab 07/11/20 1636 07/12/20 0840 07/13/20 0635  NA 135 137 137  K 4.7 4.3 3.7  CL 99 104 105  CO2 '25 23 25  '$ GLUCOSE 207* 196* 96  BUN 50* 51* 57*  CREATININE 4.12* 3.93* 3.85*  CALCIUM 9.9 9.1 8.7*  MG  --    --  2.1   GFR: Estimated Creatinine Clearance: 8.7 mL/min (A) (by C-G formula based on SCr of 3.85 mg/dL (H)). Liver Function Tests: Recent Labs  Lab 07/11/20 1636  AST 23  ALT 17  ALKPHOS 92  BILITOT 0.9  PROT 7.5  ALBUMIN 4.2   Recent Labs  Lab 07/11/20 1636  LIPASE 26   No results for input(s): AMMONIA in the last 168 hours. Coagulation Profile: No results for input(s): INR, PROTIME in the last 168 hours. Cardiac Enzymes: No results for input(s): CKTOTAL, CKMB, CKMBINDEX, TROPONINI in the last 168 hours. BNP (last 3 results) No results for input(s): PROBNP in the last 8760 hours. HbA1C: Recent Labs    07/12/20 0840  HGBA1C 6.4*   CBG: Recent Labs  Lab 07/12/20 0904 07/12/20 1152 07/12/20 1656 07/12/20 2039 07/13/20 0733  GLUCAP 178* 186* 99 189* 80   Lipid Profile: No results for input(s): CHOL, HDL, LDLCALC, TRIG, CHOLHDL, LDLDIRECT in the last 72 hours. Thyroid Function Tests: No results for input(s): TSH, T4TOTAL, FREET4, T3FREE, THYROIDAB in the last 72 hours. Anemia Panel: No results for input(s): VITAMINB12, FOLATE, FERRITIN, TIBC, IRON, RETICCTPCT in the last 72 hours. Sepsis Labs: No results for input(s): PROCALCITON, LATICACIDVEN in the last 168 hours.  Recent Results (from the past 240 hour(s))  Resp Panel by RT-PCR (Flu A&B, Covid) Nasopharyngeal Swab     Status: None   Collection Time: 07/11/20  6:00 PM   Specimen: Nasopharyngeal Swab; Nasopharyngeal(NP) swabs in vial transport medium  Result Value Ref Range Status   SARS Coronavirus 2 by RT PCR NEGATIVE NEGATIVE Final    Comment: (NOTE) SARS-CoV-2 target nucleic acids are NOT DETECTED.  The SARS-CoV-2 RNA is generally detectable in upper respiratory specimens during the acute phase of infection. The lowest concentration of SARS-CoV-2 viral copies this assay can detect is 138 copies/mL. A negative result does not preclude SARS-Cov-2 infection and should not be used as the sole basis for  treatment or other patient management decisions. A negative result may occur with  improper specimen collection/handling, submission of specimen other than nasopharyngeal swab, presence of viral mutation(s) within the areas targeted by this assay, and inadequate number of viral copies(<138 copies/mL). A negative result must be combined with clinical observations, patient history, and epidemiological information. The expected result is Negative.  Fact Sheet for Patients:  EntrepreneurPulse.com.au  Fact Sheet for Healthcare Providers:  IncredibleEmployment.be  This test is no t yet approved or cleared by the [place] FDA and  has been authorized for detection and/or diagnosis of SARS-CoV-2 by FDA under an Emergency Use Authorization (EUA). This EUA will remain  in effect (meaning this test can be used) for the duration of the COVID-19 declaration under Section 564(b)(1) of the Act, 21 U.S.C.section 360bbb-3(b)(1), unless the authorization is terminated  or revoked sooner.       Influenza A by PCR NEGATIVE  NEGATIVE Final   Influenza B by PCR NEGATIVE NEGATIVE Final    Comment: (NOTE) The Xpert Xpress SARS-CoV-2/FLU/RSV plus assay is intended as an aid in the diagnosis of influenza from Nasopharyngeal swab specimens and should not be used as a sole basis for treatment. Nasal washings and aspirates are unacceptable for Xpert Xpress SARS-CoV-2/FLU/RSV testing.  Fact Sheet for Patients: EntrepreneurPulse.com.au  Fact Sheet for Healthcare Providers: IncredibleEmployment.be  This test is not yet approved or cleared by the [place] FDA and has been authorized for detection and/or diagnosis of SARS-CoV-2 by FDA under an Emergency Use Authorization (EUA). This EUA will remain in effect (meaning this test can be used) for the duration of the COVID-19 declaration under Section 564(b)(1) of the Act, 21  U.S.C. section 360bbb-3(b)(1), unless the authorization is terminated or revoked.  Performed at [facility], [address] [place], [place] [address]          Radiology Studies: CT ABDOMEN PELVIS WO CONTRAST  Result Date: 07/11/2020 CLINICAL DATA:  Left lower quadrant abdominal pain EXAM: CT ABDOMEN AND PELVIS WITHOUT CONTRAST TECHNIQUE: Multidetector CT imaging of the abdomen and pelvis was performed following the standard protocol without IV contrast. COMPARISON:  None. FINDINGS: Lower chest: Cardiomegaly. Extensive 3 vessel coronary artery calcifications and/or stents. Small pericardial effusion. Hepatobiliary: No solid liver abnormality is seen. Calcified gallstones in the gallbladder. Gallbladder wall thickening, or biliary dilatation. Pancreas: Unremarkable. No pancreatic ductal dilatation or surrounding inflammatory changes. Spleen: Normal in size without significant abnormality. Adrenals/Urinary Tract: Adrenal glands are unremarkable. Kidneys are normal, without renal calculi, solid lesion, or hydronephrosis. Bladder is unremarkable. Stomach/Bowel: Stomach is within normal limits. There are numerous distended loops of small bowel in the mid abdomen, measuring up to 3.5 cm in caliber. There is an abrupt caliber change of the bowel distal to a left-sided inguinal hernia containing a single knuckled loop of ileum (series 2, image 73). Descending and sigmoid diverticulosis. Vascular/Lymphatic: Aortic atherosclerosis. No enlarged abdominal or pelvic lymph nodes. Reproductive: Status post hysterectomy. Other: Left-sided inguinal hernia containing obstructed appearing with bowel as detailed above. Small volume ascites throughout the abdomen and pelvis. Musculoskeletal: No acute or significant osseous findings. IMPRESSION: 1. There are numerous distended loops of small bowel in the mid abdomen, measuring up to 3.5 cm in caliber. There is an abrupt caliber change of the bowel distal to a  left-sided inguinal hernia containing a single knuckled loop of ileum. Findings are consistent with small bowel obstruction. 2. Small volume ascites throughout the abdomen and pelvis, likely reactive. 3. Cholelithiasis without evidence of acute cholecystitis. 4. Cardiomegaly and coronary artery disease. 5. Small pericardial effusion. Aortic Atherosclerosis (ICD10-I70.0). Electronically Signed   By: Eddie Candle M.D.   On: 07/11/2020 18:34   DG Abdomen 1 View  Result Date: 07/11/2020 CLINICAL DATA:  Enteric catheter placement EXAM: ABDOMEN - 1 VIEW COMPARISON:  06/06/2019 FINDINGS: Frontal view of the lower chest and upper abdomen demonstrates enteric catheter tip and side port projecting over the distal thoracic esophagus. Recommend advancing at least 7.5 cm to ensure side port placement within the gastric lumen. Chronic elevation of the right hemidiaphragm. Diffuse gaseous distention of the small bowel concerning for obstruction. IMPRESSION: 1. Enteric catheter as above, tip and side port projecting over distal thoracic esophagus. Recommend advancing at least 7.5 cm to ensure side port placement within the gastric lumen. 2. Small bowel obstruction. Electronically Signed   By: Randa Ngo M.D.   On: 07/11/2020 20:52  Scheduled Meds: . insulin aspart  0-6 Units Subcutaneous TID WC   Continuous Infusions: . piperacillin-tazobactam (ZOSYN)  IV 2.25 g (07/13/20 0506)     LOS: 2 days    Time spent: 25 minutes    Sharen Hones, MD [facility]   To contact the attending provider between 7A-7P or the covering provider during after hours 7P-7A, please log into the web site [email or web address] and access using universal [place] password for that web site. If you do not have the password, please call the hospital operator.  07/13/2020, 10:46 AM

## 2020-07-14 DIAGNOSIS — N186 End stage renal disease: Secondary | ICD-10-CM | POA: Diagnosis not present

## 2020-07-14 DIAGNOSIS — I5032 Chronic diastolic (congestive) heart failure: Secondary | ICD-10-CM | POA: Diagnosis not present

## 2020-07-14 DIAGNOSIS — K4030 Unilateral inguinal hernia, with obstruction, without gangrene, not specified as recurrent: Secondary | ICD-10-CM | POA: Diagnosis not present

## 2020-07-14 LAB — URINALYSIS, COMPLETE (UACMP) WITH MICROSCOPIC
Bacteria, UA: NONE SEEN
Bilirubin Urine: NEGATIVE
Glucose, UA: NEGATIVE mg/dL
Ketones, ur: NEGATIVE mg/dL
Nitrite: NEGATIVE
Protein, ur: 100 mg/dL — AB
Specific Gravity, Urine: 1.015 (ref 1.005–1.030)
pH: 5 (ref 5.0–8.0)

## 2020-07-14 LAB — GLUCOSE, CAPILLARY
Glucose-Capillary: 108 mg/dL — ABNORMAL HIGH (ref 70–99)
Glucose-Capillary: 100 mg/dL — ABNORMAL HIGH (ref 70–99)
Glucose-Capillary: 82 mg/dL (ref 70–99)
Glucose-Capillary: 120 mg/dL — ABNORMAL HIGH (ref 70–99)

## 2020-07-14 LAB — SURGICAL PATHOLOGY

## 2020-07-14 MED ORDER — LIDOCAINE-PRILOCAINE 2.5-2.5 % EX CREA
TOPICAL_CREAM | Freq: Once | CUTANEOUS | Status: AC
  Filled 2020-07-14: qty 5

## 2020-07-14 NOTE — Discharge Summary (Addendum)
Physician Discharge Summary  Patient ID: Lori Stanley MRN: [id number] DOB/AGE: 10-11-1931 87 y.o.  Admit date: 07/11/2020 Discharge date: 07/14/2020  Admission Diagnoses:  Discharge Diagnoses:  Principal Problem:   Incarcerated left inguinal hernia Active Problems:   Essential hypertension, malignant   Diabetes ([place])   ESRD (end stage renal disease) ([place])   SBO (small bowel obstruction) (HCC)   Chronic diastolic CHF (congestive heart failure) ([place])   Discharged Condition: good Hospital Course:  Alvaretta Mcparland [place] a 87 y.o.femalewith medical history significant forESRD on HD (MW), chronic diastolic heart failure, diet-controlled type 2 diabetes, hypertension, and secondary hyperparathyroidism who presents with concerns ofabdominal pain,persistent nausea, vomiting and diarrhea. CT scan of abdomen/pelvis showed left-sided inguinal hernia incarceration. Patient hadhernia repair on 3/13.  #1.  Incarcerated left-sided inguinal hernia.  Status post hernia repair. Patient condition had improved, she is tolerating diet, she had a bowel movement, she passed gas.  General surgery has cleared patient for discharge. Patient had an abnormal urine, however, patient does not have any urinary symptoms.  She already has finished 3 days of Zosyn, no concern for UTI  2.  End-stage renal disease. Continue outpatient dialysis  3.  Type 2 diabetes. Resume home regimens.  4.  Essential hypertension. Resume home regimen.    Consults: general surgery  Significant Diagnostic Studies:  CT ABDOMEN AND PELVIS WITHOUT CONTRAST  TECHNIQUE: Multidetector CT imaging of the abdomen and pelvis was performed following the standard protocol without IV contrast.  COMPARISON:  None.  FINDINGS: Lower chest: Cardiomegaly. Extensive 3 vessel coronary artery calcifications and/or stents. Small pericardial effusion.  Hepatobiliary: No solid liver abnormality is seen.  Calcified gallstones in the gallbladder. Gallbladder wall thickening, or biliary dilatation.  Pancreas: Unremarkable. No pancreatic ductal dilatation or surrounding inflammatory changes.  Spleen: Normal in size without significant abnormality.  Adrenals/Urinary Tract: Adrenal glands are unremarkable. Kidneys are normal, without renal calculi, solid lesion, or hydronephrosis. Bladder is unremarkable.  Stomach/Bowel: Stomach is within normal limits. There are numerous distended loops of small bowel in the mid abdomen, measuring up to 3.5 cm in caliber. There is an abrupt caliber change of the bowel distal to a left-sided inguinal hernia containing a single knuckled loop of ileum (series 2, image 73). Descending and sigmoid diverticulosis.  Vascular/Lymphatic: Aortic atherosclerosis. No enlarged abdominal or pelvic lymph nodes.  Reproductive: Status post hysterectomy.  Other: Left-sided inguinal hernia containing obstructed appearing with bowel as detailed above. Small volume ascites throughout the abdomen and pelvis.  Musculoskeletal: No acute or significant osseous findings.  IMPRESSION: 1. There are numerous distended loops of small bowel in the mid abdomen, measuring up to 3.5 cm in caliber. There is an abrupt caliber change of the bowel distal to a left-sided inguinal hernia containing a single knuckled loop of ileum. Findings are consistent with small bowel obstruction. 2. Small volume ascites throughout the abdomen and pelvis, likely reactive. 3. Cholelithiasis without evidence of acute cholecystitis. 4. Cardiomegaly and coronary artery disease. 5. Small pericardial effusion.  Aortic Atherosclerosis (ICD10-I70.0).   Electronically Signed   By: Eddie Candle M.D.   On: 07/11/2020 18:34    Treatments: Hiatal hernia repair.  Discharge Exam: Blood pressure (!) 160/56, pulse 69, temperature 98.4 F (36.9 C), temperature source Oral, resp. rate 16,  height '5\' 3"'$  (1.6 m), weight 56.7 kg, SpO2 96 %. General appearance: alert and cooperative Resp: clear to auscultation bilaterally Cardio: regular rate and rhythm, S1, S2 normal, no murmur, click, rub or gallop GI: soft, non-tender; bowel sounds  normal; no masses,  no organomegaly Extremities: extremities normal, atraumatic, no cyanosis or edema  Disposition: Discharge disposition: 01-Home or Self Care       Discharge Instructions    Diet general   Complete by: As directed    Renal diet   Discharge wound care:   Complete by: As directed    Keep clean, follow with surgery   Increase activity slowly   Complete by: As directed      Allergies as of 07/14/2020      Reactions   Antihistamines, Chlorpheniramine-type Other (See Comments)   Unknown reaction.   Paxil [paroxetine]    "makes her feel funny" not in a good way   Codeine Rash, Other (See Comments)   Mouth sore      Medication List    TAKE these medications   acetaminophen 500 MG tablet Commonly known as: TYLENOL Take 500-1,000 mg by mouth every 6 (six) hours as needed for mild pain or fever.   albuterol 108 (90 Base) MCG/ACT inhaler Commonly known as: VENTOLIN HFA TAKE 2 PUFFS BY MOUTH EVERY 6 HOURS AS NEEDED FOR WHEEZE OR SHORTNESS OF BREATH   amLODipine 5 MG tablet Commonly known as: NORVASC Take 1 tablet (5 mg total) by mouth daily.   Bepotastine Besilate 1.5 % Soln Place 1 drop into both eyes in the morning and at bedtime. Morning & afternoon.   brimonidine 0.2 % ophthalmic solution Commonly known as: ALPHAGAN Place 1 drop into both eyes 2 (two) times daily.   calcitRIOL 0.25 MCG capsule Commonly known as: ROCALTROL Take 0.25 mcg by mouth every Monday, Wednesday, and Friday.   carvedilol 12.5 MG tablet Commonly known as: COREG TAKE 1 TABLET (12.5 MG TOTAL) BY MOUTH 2 (TWO) TIMES DAILY.   [employer] Aspirin Adult Low Dose 81 MG chewable tablet Generic drug: aspirin CHEW 1 TABLET (81 MG TOTAL) BY MOUTH  DAILY.   dorzolamide-timolol 22.3-6.8 MG/ML ophthalmic solution Commonly known as: COSOPT Place 1 drop into both eyes 2 (two) times daily.   latanoprost 0.005 % ophthalmic solution Commonly known as: XALATAN Place 1 drop into both eyes at bedtime.   loratadine 10 MG tablet Commonly known as: CLARITIN Take 1 tablet (10 mg total) by mouth daily. What changed:   when to take this  reasons to take this   multivitamin Tabs tablet Take 1 tablet by mouth daily.   OXYGEN Inhale 2 L into the lungs at bedtime.   sertraline 25 MG tablet Commonly known as: ZOLOFT TAKE 1 TABLET BY MOUTH EVERY DAY   telmisartan 40 MG tablet Commonly known as: MICARDIS Take 40 mg by mouth daily.   torsemide 100 MG tablet Commonly known as: DEMADEX Take 100 mg by mouth daily.   traZODone 150 MG tablet Commonly known as: DESYREL TAKE 1 TABLET (150 MG TOTAL) BY MOUTH AT BEDTIME.            Discharge Care Instructions  (From admission, onward)         Start     Ordered   07/14/20 0000  Discharge wound care:       Comments: Keep clean, follow with surgery   07/14/20 [id number]          Follow-up Information    Olean Ree, MD. Schedule an appointment as soon as possible for a visit on 07/20/2020.   Specialty: General Surgery Why: s/p incarcerated left femoral hernia repair, has drain  Contact information: [address] [place] [address] [phone]  Jerrol Banana., MD Follow up in 1 week(s).   Specialty: Family Medicine Contact information: [address] [place] [address] [phone]        Wellington Hampshire, MD .   Specialty: Cardiology Contact information: [place] [address] [phone]               Signed: Sharen Hones 07/14/2020, 9:21 AM

## 2020-07-14 NOTE — Progress Notes (Signed)
[facility] Day(s): 3.   Post op day(s): 2 Days Post-Op.   Interval History:  Patient seen and examined No acute events or new complaints overnight.  Patient reports she is doing well; still with LLQ soreness No fever, chills, nausea, emesis No new labs or imaging  Surgical drain with 50 ccs out; serosanguinous She has tolerated soft diet without issues; having bowel function Plan for HD today Worked with therapies; recommending HHPT  Vital signs in last 24 hours: [min-max] current  Temp:  [98 F (36.7 C)-99.1 F (37.3 C)] 98 F (36.7 C) (03/15 0527) Pulse Rate:  [64-69] 64 (03/15 0527) Resp:  [16-20] 18 (03/15 0527) BP: (120-160)/(49-64) 155/56 (03/15 0527) SpO2:  [92 %-97 %] 94 % (03/15 0527)     Height: '5\' 3"'$  (160 cm) Weight: 56.7 kg BMI (Calculated): 22.15   Intake/Output last 2 shifts:  03/14 0701 - 03/15 0700 In: 600 [P.O.:600] Out: 250 [Urine:200; Drains:50]   Physical Exam:  Constitutional: alert, cooperative and no distress  Respiratory: breathing non-labored at rest  Cardiovascular: regular rate and sinus rhythm, she does have a systolic murmur Gastrointestinal: Soft, incisional soreness in the LLQ/Left Groin, non-distended, no rebound/guarding, surgical drain in left groin with serosanguinous drainage  Integumentary: Incision in left groin with dressing in place  Labs:  CBC Latest Ref Rng & Units 07/13/2020 07/12/2020 07/11/2020  WBC 4.0 - 10.5 K/uL 9.1 10.6(H) 12.8(H)  Hemoglobin 12.0 - 15.0 g/dL 10.7(L) 11.4(L) 12.7  Hematocrit 36.0 - 46.0 % 32.7(L) 36.0 39.9  Platelets 150 - 400 K/uL 224 239 285   CMP Latest Ref Rng & Units 07/13/2020 07/12/2020 07/11/2020  Glucose 70 - 99 mg/dL 96 196(H) 207(H)  BUN 8 - 23 mg/dL 57(H) 51(H) 50(H)  Creatinine 0.44 - 1.00 mg/dL 3.85(H) 3.93(H) 4.12(H)  Sodium 135 - 145 mmol/L 137 137 135  Potassium 3.5 - 5.1 mmol/L 3.7 4.3 4.7  Chloride 98 - 111 mmol/L 105 104 99  CO2 22 -  32 mmol/L '25 23 25  '$ Calcium 8.9 - 10.3 mg/dL 8.7(L) 9.1 9.9  Total Protein 6.5 - 8.1 g/dL - - 7.5  Total Bilirubin 0.3 - 1.2 mg/dL - - 0.9  Alkaline Phos 38 - 126 U/L - - 92  AST 15 - 41 U/L - - 23  ALT 0 - 44 U/L - - 17     Imaging studies: No new pertinent imaging studies   Assessment/Plan:  87 y.o. female with return of bowel function and overall doing well 2 Days Post-Op s/p Open Left Incarcerated FemoralHernia Repair   - Continue soft / regular diet   - Monitor abdominal examination; on-going bowel function             - Maintain drain; monitor and record output daily              - Pain control prn; antiemetics prn   - Continue dialysis as scheduled; plan to dialyze today   - Mobilize with therapies; recommending HH              - Further management per primary service   - Discharge Planning: Okay for discharge from surgical standpoint, I will place follow up for Monday 03/21 with Dr Hampton Abbot for drain removal    All of the above findings and recommendations were discussed with the patient, and the medical team, and all of patient's questions were answered to her expressed satisfaction.  -- Edison Simon, PA-C [facility]  07/14/2020, 7:18 AM [phone] M-F: 7am - 4pm

## 2020-07-14 NOTE — Progress Notes (Signed)
[facility]  ROUNDING NOTE   Subjective:   Lori Stanley is a 87 y.o. female with past medical history of ESRD on HD, diastolic chronic heart failure, diet controlled type 2 diabetes, hypertension and secondary hyperparathyroidism.  She presented to the ED with nausea, vomiting and diarrhea.   She was admitted for left inguinal hernia and had a repair on 07/12/20  Patient seen resting in bed eating breakfast Alert and oriented She says she is feeling well Denies nausea Denies shortness of breath  JP drain-96m   Objective:  Vital signs in last 24 hours:  Temp:  [98 F (36.7 C)-99.1 F (37.3 C)] 98.4 F (36.9 C) (03/15 0800) Pulse Rate:  [58-69] 64 (03/15 1315) Resp:  [16-28] 18 (03/15 1315) BP: (120-167)/(49-61) 142/51 (03/15 1315) SpO2:  [94 %-97 %] 96 % (03/15 0800)  Weight change:  Filed Weights   07/11/20 1625  Weight: 56.7 kg    Intake/Output: I/O last 3 completed shifts: In: 887.4 [P.O.:600; I.V.:267.4; IV Piggyback:20] Out: 300 [Urine:200; Drains:100]   Intake/Output this shift:  No intake/output data recorded.  Physical Exam: General: NAD, laying in bed  Head: Normocephalic, atraumatic. Moist oral mucosal membranes  Eyes: Anicteric  Neck: Supple, trachea midline  Lungs:  Clear to auscultation  Heart: Regular rate and rhythm  Abdomen:  Soft, nontender, left groin surgical dressing, JP drain  Extremities:  no peripheral edema.  Neurologic: Nonfocal, moving all four extremities  Skin: No lesions  Access: LUE AVG    Basic Metabolic Panel: Recent Labs  Lab 07/11/20 1636 07/12/20 0840 07/13/20 0635  NA 135 137 137  K 4.7 4.3 3.7  CL 99 104 105  CO2 '25 23 25  '$ GLUCOSE 207* 196* 96  BUN 50* 51* 57*  CREATININE 4.12* 3.93* 3.85*  CALCIUM 9.9 9.1 8.7*  MG  --   --  2.1    Liver Function Tests: Recent Labs  Lab 07/11/20 1636  AST 23  ALT 17  ALKPHOS 92  BILITOT 0.9  PROT 7.5  ALBUMIN 4.2   Recent Labs  Lab 07/11/20 1636   LIPASE 26   No results for input(s): AMMONIA in the last 168 hours.  CBC: Recent Labs  Lab 07/11/20 1636 07/12/20 0840 07/13/20 0635  WBC 12.8* 10.6* 9.1  NEUTROABS  --   --  7.4  HGB 12.7 11.4* 10.7*  HCT 39.9 36.0 32.7*  MCV 88.9 88.5 88.4  PLT 285 239 224    Cardiac Enzymes: No results for input(s): CKTOTAL, CKMB, CKMBINDEX, TROPONINI in the last 168 hours.  BNP: Invalid input(s): POCBNP  CBG: Recent Labs  Lab 07/13/20 0733 07/13/20 1144 07/13/20 1606 07/13/20 2233 07/14/20 0802  GLUCAP 80 157* 116* 108* 100*    Microbiology: Results for orders placed or performed during the hospital encounter of 07/11/20  Resp Panel by RT-PCR (Flu A&B, Covid) Nasopharyngeal Swab     Status: None   Collection Time: 07/11/20  6:00 PM   Specimen: Nasopharyngeal Swab; Nasopharyngeal(NP) swabs in vial transport medium  Result Value Ref Range Status   SARS Coronavirus 2 by RT PCR NEGATIVE NEGATIVE Final    Comment: (NOTE) SARS-CoV-2 target nucleic acids are NOT DETECTED.  The SARS-CoV-2 RNA is generally detectable in upper respiratory specimens during the acute phase of infection. The lowest concentration of SARS-CoV-2 viral copies this assay can detect is 138 copies/mL. A negative result does not preclude SARS-Cov-2 infection and should not be used as the sole basis for treatment or other patient management  decisions. A negative result may occur with  improper specimen collection/handling, submission of specimen other than nasopharyngeal swab, presence of viral mutation(s) within the areas targeted by this assay, and inadequate number of viral copies(<138 copies/mL). A negative result must be combined with clinical observations, patient history, and epidemiological information. The expected result is Negative.  Fact Sheet for Patients:  EntrepreneurPulse.com.au  Fact Sheet for Healthcare Providers:  IncredibleEmployment.be  This test  is no t yet approved or cleared by the [place] FDA and  has been authorized for detection and/or diagnosis of SARS-CoV-2 by FDA under an Emergency Use Authorization (EUA). This EUA will remain  in effect (meaning this test can be used) for the duration of the COVID-19 declaration under Section 564(b)(1) of the Act, 21 U.S.C.section 360bbb-3(b)(1), unless the authorization is terminated  or revoked sooner.       Influenza A by PCR NEGATIVE NEGATIVE Final   Influenza B by PCR NEGATIVE NEGATIVE Final    Comment: (NOTE) The Xpert Xpress SARS-CoV-2/FLU/RSV plus assay is intended as an aid in the diagnosis of influenza from Nasopharyngeal swab specimens and should not be used as a sole basis for treatment. Nasal washings and aspirates are unacceptable for Xpert Xpress SARS-CoV-2/FLU/RSV testing.  Fact Sheet for Patients: EntrepreneurPulse.com.au  Fact Sheet for Healthcare Providers: IncredibleEmployment.be  This test is not yet approved or cleared by the [place] FDA and has been authorized for detection and/or diagnosis of SARS-CoV-2 by FDA under an Emergency Use Authorization (EUA). This EUA will remain in effect (meaning this test can be used) for the duration of the COVID-19 declaration under Section 564(b)(1) of the Act, 21 U.S.C. section 360bbb-3(b)(1), unless the authorization is terminated or revoked.  Performed at [facility], [address] [place], [place] [address]     Coagulation Studies: No results for input(s): LABPROT, INR in the last 72 hours.  Urinalysis: Recent Labs    07/14/20 0540  COLORURINE YELLOW*  LABSPEC 1.015  PHURINE 5.0  GLUCOSEU NEGATIVE  HGBUR SMALL*  BILIRUBINUR NEGATIVE  KETONESUR NEGATIVE  PROTEINUR 100*  NITRITE NEGATIVE  LEUKOCYTESUR MODERATE*      Imaging: No results found.   Medications:   . piperacillin-tazobactam (ZOSYN)  IV 2.25 g (07/14/20 0626)   .  amLODipine  5 mg Oral Daily  . carvedilol  12.5 mg Oral BID WC  . insulin aspart  0-6 Units Subcutaneous TID WC   acetaminophen, fentaNYL (SUBLIMAZE) injection, guaiFENesin-dextromethorphan, hydrOXYzine, morphine injection, ondansetron (ZOFRAN) IV  Assessment/ Plan:  Lori Stanley is a 87 y.o.  female with past medical history of ESRD on HD, diastolic chronic heart failure, diet controlled type 2 diabetes, hypertension and secondary hyperparathyroidism.  Past Medical History:  Diagnosis Date  . Acute heart failure ([place])   . Anemia   . Anxiety   . CHF (congestive heart failure) ([place])   . Chronic kidney disease    stage V; end stage renal disease  . Chronic low back pain   . Depression   . Diabetes mellitus without complication ([place])   . GERD (gastroesophageal reflux disease)   . Hyperlipidemia   . Hypertension   . MI (myocardial infarction) ([place])   . Obesity     [place]/MF/LUE AVG  # End Stage Renal Disease on hemodialysis:MF - scheduled to receive dialysis today - UF goal- 2L - Next treatment scheduled for Friday  # Anemia of chronic kidney disease Hemoglobin 10.7.  Lab Results  Component Value Date   HGB  10.7 (L) 07/13/2020  Will continue to monitor  # Secondary Hyperparathyroidism: Binders not needed at this time Lab Results  Component Value Date   PTH 148 (H) 06/07/2019   CALCIUM 8.7 (L) 07/13/2020   CAION 1.35 11/13/2019   PHOS 4.0 06/12/2019      LOS: 3 Lori Stanley 3/15/20221:30 PM

## 2020-07-14 NOTE — TOC Initial Note (Signed)
Transition of Care [facility]) - Initial/Assessment Note    Patient Details  Name: Lori Stanley MRN: [id number] Date of Birth: 1932-03-29  [facility] CM/SW Contact:    Eileen Stanford, LCSW Phone Number: 07/14/2020, 3:02 PM  Clinical Narrative:    CSW spoke with pt and pt is agreeable to [facility]. Pt states she lives lone but her niece helps her as much as she needs. Pt is able to get her medications and has a established PCP. Pt states she will need a walker. Pt has no agency preference.   [facility] will service pt. [employer] will deliver walker.               Expected Discharge Plan: [place] Barriers to Discharge: No Barriers Identified   Patient Goals and CMS Choice Patient states their goals for this hospitalization and ongoing recovery are:: to get better   Choice offered to / list presented to : Patient  Expected Discharge Plan and Services Expected Discharge Plan: [place] In-house Referral: NA   Post Acute Care Choice: [place] arrangements for the past 2 months: Single Family Home Expected Discharge Date: 07/14/20               DME Arranged: [place] DME Agency: [employer] Date DME Agency Contacted: 07/14/20 Time DME Agency Contacted: 1500 Representative spoke with at DME Agency: zach [place]: PT,RN [place] Agency: [facility] [employer] [facility] [employer] Date [place]: 07/14/20 Time [place]: 1501 Representative spoke with at [place]: teresa  Prior Living Arrangements/Services Living arrangements for the past 2 months: [place] Lives with:: Self Patient language and need for interpreter reviewed:: Yes Do you feel safe going back to the place where you live?: Yes      Need for Family Participation in Patient Care: Yes (Comment) Care giver support system in place?: Yes (comment)   Criminal Activity/Legal Involvement Pertinent to Current Situation/Hospitalization: No -  Comment as needed  Activities of Daily Living Home Assistive Devices/Equipment: Walker (specify type) ADL Screening (condition at time of admission) Patient's cognitive ability adequate to safely complete daily activities?: Yes Is the patient deaf or have difficulty hearing?: No Does the patient have difficulty seeing, even when wearing glasses/contacts?: No Does the patient have difficulty concentrating, remembering, or making decisions?: No Patient able to express need for assistance with ADLs?: Yes Does the patient have difficulty dressing or bathing?: No Independently performs ADLs?: Yes (appropriate for developmental age) Does the patient have difficulty walking or climbing stairs?: No Weakness of Legs: None Weakness of Arms/Hands: None  Permission Sought/Granted Permission sought to share information with : Family Supports    Share Information with NAME: [name]  Permission granted to share info w AGENC[facility]  Permission granted to share info w Relationship: daughter     Emotional Assessment Appearance:: Appears stated age Attitude/Demeanor/Rapport: Engaged Affect (typically observed): Accepting Orientation: : Oriented to Self,Oriented to  Time,Oriented to Place,Oriented to Situation Alcohol / Substance Use: Not Applicable Psych Involvement: No (comment)  Admission diagnosis:  SBO (small bowel obstruction) ([place]) [K56.609] Unilateral inguinal hernia with obstruction and without gangrene, recurrence not specified [K40.30] Patient Active Problem List   Diagnosis Date Noted  . Chronic diastolic CHF (congestive heart failure) ([place]) 07/11/2020  . Incarcerated left inguinal hernia 07/11/2020  . SBO (small bowel obstruction) ([place])   . Unilateral inguinal hernia with obstruction and without gangrene   . Volume overload 08/30/2019  .  Anemia due to stage 4 chronic kidney disease ([place]) 08/27/2019  . ESRD (end stage renal disease) ([place])   . Acute on chronic heart failure with  preserved ejection fraction (HFpEF) ([place]) 06/05/2019  . Heart failure ([place]) 06/05/2019  . Edema of both lower extremities   . Anemia in chronic kidney disease 01/23/2019  . Benign hypertensive kidney disease with chronic kidney disease 01/23/2019  . Chronic kidney disease (CKD), stage V ([place]) 01/23/2019  . Proteinuria 01/23/2019  . Secondary hyperparathyroidism of renal origin ([place]) 01/23/2019  . Respiratory failure ([place]) 04/09/2017  . Syncopal episodes 12/15/2015  . Anemia of chronic disease 04/14/2015  . Anxiety 04/14/2015  . Clinical depression 04/14/2015  . Essential hypertension 04/14/2015  . Gastro-esophageal reflux disease without esophagitis 04/14/2015  . Arthritis, degenerative 04/14/2015  . Allergic rhinitis 04/14/2015  . Type 2 diabetes mellitus with hyperlipidemia ([place]) 04/14/2015  . Hyperlipidemia 02/27/2015  . Glaucoma 12/23/2014  . Diabetes ([place]) 12/23/2014  . GERD (gastroesophageal reflux disease) 12/23/2014  . Essential hypertension, malignant 05/24/2013  . SOB (shortness of breath) 05/24/2013  . Acute on chronic diastolic CHF (congestive heart failure) ([place]) 05/24/2013   PCP:  Jerrol Banana., MD Pharmacy:   [id number] [place] [id number]: [phone]: [phone]    Social Determinants of Health (SDOH) Interventions    Readmission Risk Interventions Readmission Risk Prevention Plan 09/03/2019  Transportation Screening Complete  Palliative Care Screening Not Applicable  Medication Review (RN Care Manager) Complete  Some recent data might be hidden

## 2020-07-14 NOTE — Care Management Important Message (Signed)
Important Message  Patient Details  Name: Lori Stanley MRN: [id number] Date of Birth: [birth year]   Medicare Important Message Given:  Yes     Dannette Barbara 07/14/2020, 11:16 AM

## 2020-07-14 NOTE — Progress Notes (Signed)
Hemodialysis patient known at [facility] Monday and Fridays only at 10:30. Patient is normally transported by friend or family. Patient stated that her sister has been transporting most recently. Patient stated no dialysis concerns.   Lori Stanley Dialysis Coordinator [phone]

## 2020-07-14 NOTE — Progress Notes (Signed)
MD ordered patient to be discharged home.  Discharge instructions were reviewed with the patient and she voiced understanding.  Follow-up appointment was made.  No prescriptions given to the patient.  IV was removed with catheter intact.  Patient educated on taking care of the JP drain and supplies were given to empty the drain.  All patients questions were answered.  Patient leaving via wheelchair escorted by NT.

## 2020-07-15 ENCOUNTER — Telehealth: Payer: Self-pay

## 2020-07-15 DIAGNOSIS — I5022 Chronic systolic (congestive) heart failure: Secondary | ICD-10-CM | POA: Diagnosis not present

## 2020-07-15 NOTE — Telephone Encounter (Signed)
Transition Care Management Follow-up Telephone Call  Date of discharge and from where: [facility] on 07/14/20  How have you been since you were released from the hospital? Doing better but is still sore in her lower abdomen. Declines a pain score at this time. Pt is not taking any pain medications for soreness. Pt had diarrhea yesterday but none so far today. Pt states she slept good last night and appetite is getting better. Incision site looks good an shows no sign of infection. Per pt the drainage is a light red but is not blood. Pt states it was the same color upon d/c. Declined fever, SOB, swelling, weakness, or n/v. Pt has not checked her BS or b/p since d/c. Everything was normal prior to leaving the hospital.  Any questions or concerns? Yes   Items Reviewed:  Did the pt receive and understand the discharge instructions provided? Yes   Medications obtained and verified? No, declined reveiwing at this time. No med changes were made.  Any new allergies since your discharge? No   Dietary orders reviewed? Yes  Do you have support at home? Yes   Other (ie: DME, Home Health, etc): Pt was d/c with a new walker.  Functional Questionnaire: (I = Independent and D = Dependent)  Bathing/Dressing- I   Meal Prep- I  Eating- I  Maintaining continence- I  Transferring/Ambulation- Uses a walker at all times.  Managing Meds- I   Follow up appointments reviewed:    PCP Hospital f/u appt confirmed? Yes  scheduled to see Dr [name] on 07/22/20 @ 2:20 PM.  [facility] f/u appt confirmed? Yes    Are transportation arrangements needed? No   If their condition worsens, is the pt aware to call  their PCP or go to the ED? Yes  Was the patient provided with contact information for the PCP's office or ED? Yes  Was the pt encouraged to call back with questions or concerns? Yes

## 2020-07-15 NOTE — Telephone Encounter (Signed)
HFU scheduled 07/22/20 @ 2:20 PM.

## 2020-07-17 ENCOUNTER — Other Ambulatory Visit: Payer: Self-pay

## 2020-07-17 ENCOUNTER — Inpatient Hospital Stay
Admission: EM | Admit: 2020-07-17 | Discharge: 2020-07-20 | DRG: 919 | Disposition: A | Discharge status: Home / Self Care | Payer: Medicare Other | Attending: Surgery | Admitting: Surgery

## 2020-07-17 ENCOUNTER — Emergency Department: Payer: Medicare Other

## 2020-07-17 DIAGNOSIS — N2581 Secondary hyperparathyroidism of renal origin: Secondary | ICD-10-CM | POA: Diagnosis present

## 2020-07-17 DIAGNOSIS — G8929 Other chronic pain: Secondary | ICD-10-CM | POA: Diagnosis not present

## 2020-07-17 DIAGNOSIS — Z79899 Other long term (current) drug therapy: Secondary | ICD-10-CM

## 2020-07-17 DIAGNOSIS — K8020 Calculus of gallbladder without cholecystitis without obstruction: Secondary | ICD-10-CM | POA: Diagnosis not present

## 2020-07-17 DIAGNOSIS — D631 Anemia in chronic kidney disease: Secondary | ICD-10-CM | POA: Diagnosis present

## 2020-07-17 DIAGNOSIS — Z6821 Body mass index (BMI) 21.0-21.9, adult: Secondary | ICD-10-CM | POA: Diagnosis not present

## 2020-07-17 DIAGNOSIS — E1122 Type 2 diabetes mellitus with diabetic chronic kidney disease: Secondary | ICD-10-CM | POA: Diagnosis present

## 2020-07-17 DIAGNOSIS — I120 Hypertensive chronic kidney disease with stage 5 chronic kidney disease or end stage renal disease: Secondary | ICD-10-CM | POA: Diagnosis not present

## 2020-07-17 DIAGNOSIS — I252 Old myocardial infarction: Secondary | ICD-10-CM | POA: Diagnosis not present

## 2020-07-17 DIAGNOSIS — F419 Anxiety disorder, unspecified: Secondary | ICD-10-CM | POA: Diagnosis present

## 2020-07-17 DIAGNOSIS — J309 Allergic rhinitis, unspecified: Secondary | ICD-10-CM | POA: Diagnosis present

## 2020-07-17 DIAGNOSIS — I132 Hypertensive heart and chronic kidney disease with heart failure and with stage 5 chronic kidney disease, or end stage renal disease: Secondary | ICD-10-CM | POA: Diagnosis present

## 2020-07-17 DIAGNOSIS — L7622 Postprocedural hemorrhage and hematoma of skin and subcutaneous tissue following other procedure: Secondary | ICD-10-CM | POA: Diagnosis not present

## 2020-07-17 DIAGNOSIS — Z888 Allergy status to other drugs, medicaments and biological substances status: Secondary | ICD-10-CM

## 2020-07-17 DIAGNOSIS — K219 Gastro-esophageal reflux disease without esophagitis: Secondary | ICD-10-CM | POA: Diagnosis present

## 2020-07-17 DIAGNOSIS — K5750 Diverticulosis of both small and large intestine without perforation or abscess without bleeding: Secondary | ICD-10-CM | POA: Diagnosis not present

## 2020-07-17 DIAGNOSIS — F32A Depression, unspecified: Secondary | ICD-10-CM | POA: Diagnosis present

## 2020-07-17 DIAGNOSIS — N186 End stage renal disease: Secondary | ICD-10-CM

## 2020-07-17 DIAGNOSIS — Z7982 Long term (current) use of aspirin: Secondary | ICD-10-CM | POA: Diagnosis not present

## 2020-07-17 DIAGNOSIS — Z79891 Long term (current) use of opiate analgesic: Secondary | ICD-10-CM | POA: Diagnosis not present

## 2020-07-17 DIAGNOSIS — R9431 Abnormal electrocardiogram [ECG] [EKG]: Secondary | ICD-10-CM | POA: Diagnosis not present

## 2020-07-17 DIAGNOSIS — Y838 Other surgical procedures as the cause of abnormal reaction of the patient, or of later complication, without mention of misadventure at the time of the procedure: Secondary | ICD-10-CM | POA: Diagnosis present

## 2020-07-17 DIAGNOSIS — Z992 Dependence on renal dialysis: Secondary | ICD-10-CM | POA: Diagnosis not present

## 2020-07-17 DIAGNOSIS — D62 Acute posthemorrhagic anemia: Secondary | ICD-10-CM | POA: Diagnosis not present

## 2020-07-17 DIAGNOSIS — R58 Hemorrhage, not elsewhere classified: Secondary | ICD-10-CM | POA: Diagnosis not present

## 2020-07-17 DIAGNOSIS — E785 Hyperlipidemia, unspecified: Secondary | ICD-10-CM | POA: Diagnosis present

## 2020-07-17 DIAGNOSIS — Z20822 Contact with and (suspected) exposure to covid-19: Secondary | ICD-10-CM | POA: Diagnosis present

## 2020-07-17 DIAGNOSIS — S301XXA Contusion of abdominal wall, initial encounter: Secondary | ICD-10-CM | POA: Diagnosis not present

## 2020-07-17 DIAGNOSIS — L7632 Postprocedural hematoma of skin and subcutaneous tissue following other procedure: Secondary | ICD-10-CM | POA: Diagnosis not present

## 2020-07-17 DIAGNOSIS — I700 Atherosclerosis of aorta: Secondary | ICD-10-CM | POA: Diagnosis not present

## 2020-07-17 DIAGNOSIS — Z885 Allergy status to narcotic agent status: Secondary | ICD-10-CM

## 2020-07-17 DIAGNOSIS — E669 Obesity, unspecified: Secondary | ICD-10-CM | POA: Diagnosis present

## 2020-07-17 DIAGNOSIS — Z743 Need for continuous supervision: Secondary | ICD-10-CM | POA: Diagnosis not present

## 2020-07-17 DIAGNOSIS — R6889 Other general symptoms and signs: Secondary | ICD-10-CM | POA: Diagnosis not present

## 2020-07-17 DIAGNOSIS — I2510 Atherosclerotic heart disease of native coronary artery without angina pectoris: Secondary | ICD-10-CM | POA: Diagnosis not present

## 2020-07-17 DIAGNOSIS — R0902 Hypoxemia: Secondary | ICD-10-CM | POA: Diagnosis not present

## 2020-07-17 DIAGNOSIS — Z87891 Personal history of nicotine dependence: Secondary | ICD-10-CM

## 2020-07-17 DIAGNOSIS — T148XXA Other injury of unspecified body region, initial encounter: Secondary | ICD-10-CM | POA: Diagnosis present

## 2020-07-17 DIAGNOSIS — Z90710 Acquired absence of both cervix and uterus: Secondary | ICD-10-CM

## 2020-07-17 DIAGNOSIS — I5032 Chronic diastolic (congestive) heart failure: Secondary | ICD-10-CM | POA: Diagnosis present

## 2020-07-17 DIAGNOSIS — S301XXD Contusion of abdominal wall, subsequent encounter: Secondary | ICD-10-CM

## 2020-07-17 DIAGNOSIS — D500 Iron deficiency anemia secondary to blood loss (chronic): Secondary | ICD-10-CM | POA: Diagnosis not present

## 2020-07-17 DIAGNOSIS — Z8249 Family history of ischemic heart disease and other diseases of the circulatory system: Secondary | ICD-10-CM

## 2020-07-17 HISTORY — DX: Other injury of unspecified body region, initial encounter: T14.8XXA

## 2020-07-17 LAB — COMPREHENSIVE METABOLIC PANEL
ALT: 14 U/L (ref 0–44)
AST: 17 U/L (ref 15–41)
Albumin: 3.2 g/dL — ABNORMAL LOW (ref 3.5–5.0)
Alkaline Phosphatase: 65 U/L (ref 38–126)
Anion gap: 7 (ref 5–15)
BUN: 31 mg/dL — ABNORMAL HIGH (ref 8–23)
CO2: 29 mmol/L (ref 22–32)
Calcium: 9.4 mg/dL (ref 8.9–10.3)
Chloride: 103 mmol/L (ref 98–111)
Creatinine, Ser: 3.25 mg/dL — ABNORMAL HIGH (ref 0.44–1.00)
GFR, Estimated: 13 mL/min — ABNORMAL LOW (ref >60)
Glucose, Bld: 103 mg/dL — ABNORMAL HIGH (ref 70–99)
Potassium: 4.6 mmol/L (ref 3.5–5.1)
Sodium: 139 mmol/L (ref 135–145)
Total Bilirubin: 0.6 mg/dL (ref 0.3–1.2)
Total Protein: 6.1 g/dL — ABNORMAL LOW (ref 6.5–8.1)

## 2020-07-17 LAB — CBC WITH DIFFERENTIAL/PLATELET
Abs Immature Granulocytes: 0.03 10*3/uL (ref 0.00–0.07)
Basophils Absolute: 0 10*3/uL (ref 0.0–0.1)
Basophils Relative: 0 %
Eosinophils Absolute: 0.2 10*3/uL (ref 0.0–0.5)
Eosinophils Relative: 3 %
HCT: 33.7 % — ABNORMAL LOW (ref 36.0–46.0)
Hemoglobin: 10.4 g/dL — ABNORMAL LOW (ref 12.0–15.0)
Immature Granulocytes: 0 %
Lymphocytes Relative: 12 %
Lymphs Abs: 0.9 10*3/uL (ref 0.7–4.0)
MCH: 28.5 pg (ref 26.0–34.0)
MCHC: 30.9 g/dL (ref 30.0–36.0)
MCV: 92.3 fL (ref 80.0–100.0)
Monocytes Absolute: 0.4 10*3/uL (ref 0.1–1.0)
Monocytes Relative: 6 %
Neutro Abs: 6 10*3/uL (ref 1.7–7.7)
Neutrophils Relative %: 79 %
Platelets: 262 10*3/uL (ref 150–400)
RBC: 3.65 MIL/uL — ABNORMAL LOW (ref 3.87–5.11)
RDW: 13.9 % (ref 11.5–15.5)
WBC: 7.5 10*3/uL (ref 4.0–10.5)
nRBC: 0 % (ref 0.0–0.2)

## 2020-07-17 LAB — TYPE AND SCREEN
ABO/RH(D): O POS
Antibody Screen: NEGATIVE

## 2020-07-17 LAB — RESP PANEL BY RT-PCR (FLU A&B, COVID) ARPGX2
Influenza A by PCR: NEGATIVE
Influenza B by PCR: NEGATIVE
SARS Coronavirus 2 by RT PCR: NEGATIVE

## 2020-07-17 LAB — PROTIME-INR
INR: 1.2 (ref 0.8–1.2)
Prothrombin Time: 14.6 seconds (ref 11.4–15.2)

## 2020-07-17 MED ORDER — ONDANSETRON 4 MG PO TBDP: 4.0000 mg | ORAL_TABLET | Freq: Four times a day (QID) | ORAL | Status: DC | PRN

## 2020-07-17 MED ORDER — TRAZODONE HCL 50 MG PO TABS
150.0000 mg | ORAL_TABLET | Freq: Every day | ORAL | Status: DC
  Administered 2020-07-17 – 2020-07-19 (×3): 150 mg via ORAL
  Filled 2020-07-17 (×3): qty 1

## 2020-07-17 MED ORDER — BRIMONIDINE TARTRATE 0.2 % OP SOLN
1.0000 [drp] | Freq: Two times a day (BID) | OPHTHALMIC | Status: DC
  Administered 2020-07-17 – 2020-07-20 (×6): 1 [drp] via OPHTHALMIC
  Filled 2020-07-17: qty 5

## 2020-07-17 MED ORDER — ACETAMINOPHEN 500 MG PO TABS
500.0000 mg | ORAL_TABLET | Freq: Four times a day (QID) | ORAL | Status: DC | PRN
  Administered 2020-07-19: 500 mg via ORAL
  Administered 2020-07-20: 1000 mg via ORAL
  Filled 2020-07-17: qty 2
  Filled 2020-07-17: qty 1

## 2020-07-17 MED ORDER — TORSEMIDE 100 MG PO TABS
100.0000 mg | ORAL_TABLET | Freq: Every day | ORAL | Status: DC
  Administered 2020-07-17 – 2020-07-19 (×3): 100 mg via ORAL
  Filled 2020-07-17 (×4): qty 1

## 2020-07-17 MED ORDER — LATANOPROST 0.005 % OP SOLN
1.0000 [drp] | Freq: Every day | OPHTHALMIC | Status: DC
  Administered 2020-07-17 – 2020-07-19 (×3): 1 [drp] via OPHTHALMIC
  Filled 2020-07-17: qty 2.5

## 2020-07-17 MED ORDER — OXYCODONE HCL 5 MG PO TABS
5.0000 mg | ORAL_TABLET | ORAL | Status: DC | PRN
  Administered 2020-07-17 (×2): 5 mg via ORAL
  Administered 2020-07-19: 10 mg via ORAL
  Filled 2020-07-17: qty 2
  Filled 2020-07-17 (×2): qty 1

## 2020-07-17 MED ORDER — DORZOLAMIDE HCL-TIMOLOL MAL 2-0.5 % OP SOLN
1.0000 [drp] | Freq: Two times a day (BID) | OPHTHALMIC | Status: DC
  Administered 2020-07-17 – 2020-07-20 (×6): 1 [drp] via OPHTHALMIC
  Filled 2020-07-17: qty 10

## 2020-07-17 MED ORDER — SERTRALINE HCL 50 MG PO TABS
25.0000 mg | ORAL_TABLET | Freq: Every day | ORAL | Status: DC
  Administered 2020-07-17 – 2020-07-20 (×4): 25 mg via ORAL
  Filled 2020-07-17 (×4): qty 1

## 2020-07-17 MED ORDER — ONDANSETRON HCL 4 MG/2ML IJ SOLN: 4.0000 mg | Freq: Four times a day (QID) | INTRAMUSCULAR | Status: DC | PRN

## 2020-07-17 MED ORDER — HYDROMORPHONE HCL 1 MG/ML IJ SOLN: 0.5000 mg | INTRAMUSCULAR | Status: DC | PRN

## 2020-07-17 MED ORDER — CARVEDILOL 12.5 MG PO TABS
12.5000 mg | ORAL_TABLET | Freq: Two times a day (BID) | ORAL | Status: DC
  Administered 2020-07-17 – 2020-07-19 (×6): 12.5 mg via ORAL
  Filled 2020-07-17: qty 1
  Filled 2020-07-17: qty 2
  Filled 2020-07-17 (×4): qty 1

## 2020-07-17 MED ORDER — FENTANYL CITRATE (PF) 100 MCG/2ML IJ SOLN
50.0000 ug | Freq: Once | INTRAMUSCULAR | Status: AC
Start: 2020-07-17 — End: 2020-07-17
  Administered 2020-07-17: 50 ug via INTRAVENOUS
  Filled 2020-07-17: qty 2

## 2020-07-17 MED ORDER — AMLODIPINE BESYLATE 5 MG PO TABS
5.0000 mg | ORAL_TABLET | Freq: Every day | ORAL | Status: DC
  Administered 2020-07-17 – 2020-07-19 (×2): 5 mg via ORAL
  Filled 2020-07-17 (×3): qty 1

## 2020-07-17 MED ORDER — ALBUTEROL SULFATE HFA 108 (90 BASE) MCG/ACT IN AERS
2.0000 | INHALATION_SPRAY | Freq: Four times a day (QID) | RESPIRATORY_TRACT | Status: DC | PRN
  Filled 2020-07-17: qty 6.7

## 2020-07-17 NOTE — ED Notes (Signed)
Pt transported by transport team at this time

## 2020-07-17 NOTE — ED Provider Notes (Signed)
[facility] Emergency Department Provider Note  ____________________________________________   Event Date/Time   First MD Initiated Contact with Patient 07/17/20 [phone]     (approximate)  I have reviewed the triage vital signs and the nursing notes.   HISTORY  Chief Complaint bleeding from JP drain site   HPI Lori Stanley is a 87 y.o. female with medical history significant forESRD (on HD (MW however most recently on 3/15), chronic diastolic heart failure, diet-controlled type 2 diabetes, hypertension, and secondary hyperparathyroidism, and recent admission for incarcerated left-sided inguinal hernia s/p operative reduction discharged with Keenan Bachelor drain who presents for assessment from the EMS of some left lower quadrant abdominal pain associate with some bleeding around the drain insertion site that she notes this morning.  Patient states that since being discharged she has noticed some bloody drainage and thinks it has been emptied maybe twice.  She states that she has not had any bleeding around the insertion site until today and that this is slightly below the incision site which otherwise seems to been healing well.  He denies any headache, earache, sore throat, cough, shortness of breath, nausea, vomiting, diarrhea, dysuria, rash or extremity pain.  No other acute complaints at this time.         Past Medical History:  Diagnosis Date  . Acute heart failure ([place])   . Anemia   . Anxiety   . CHF (congestive heart failure) ([place])   . Chronic kidney disease    stage V; end stage renal disease  . Chronic low back pain   . Depression   . Diabetes mellitus without complication ([place])   . GERD (gastroesophageal reflux disease)   . Hyperlipidemia   . Hypertension   . MI (myocardial infarction) ([place])   . Obesity     Patient Active Problem List   Diagnosis Date Noted  . Hematoma 07/17/2020  . Chronic diastolic CHF (congestive heart failure) ([place])  07/11/2020  . Incarcerated left inguinal hernia 07/11/2020  . SBO (small bowel obstruction) ([place])   . Unilateral inguinal hernia with obstruction and without gangrene   . Volume overload 08/30/2019  . Anemia due to stage 4 chronic kidney disease ([place]) 08/27/2019  . ESRD (end stage renal disease) ([place])   . Acute on chronic heart failure with preserved ejection fraction (HFpEF) ([place]) 06/05/2019  . Heart failure ([place]) 06/05/2019  . Edema of both lower extremities   . Anemia in chronic kidney disease 01/23/2019  . Benign hypertensive kidney disease with chronic kidney disease 01/23/2019  . Chronic kidney disease (CKD), stage V ([place]) 01/23/2019  . Proteinuria 01/23/2019  . Secondary hyperparathyroidism of renal origin ([place]) 01/23/2019  . Respiratory failure ([place]) 04/09/2017  . Syncopal episodes 12/15/2015  . Anemia of chronic disease 04/14/2015  . Anxiety 04/14/2015  . Clinical depression 04/14/2015  . Essential hypertension 04/14/2015  . Gastro-esophageal reflux disease without esophagitis 04/14/2015  . Arthritis, degenerative 04/14/2015  . Allergic rhinitis 04/14/2015  . Type 2 diabetes mellitus with hyperlipidemia ([place]) 04/14/2015  . Hyperlipidemia 02/27/2015  . Glaucoma 12/23/2014  . Diabetes ([place]) 12/23/2014  . GERD (gastroesophageal reflux disease) 12/23/2014  . Essential hypertension, malignant 05/24/2013  . SOB (shortness of breath) 05/24/2013  . Acute on chronic diastolic CHF (congestive heart failure) ([place]) 05/24/2013    Past Surgical History:  Procedure Laterality Date  . A/V FISTULAGRAM Left 01/15/2020   Procedure: A/V FISTULAGRAM;  Surgeon: Katha Cabal, MD;  Location: [place] [facility];  Service: Cardiovascular;  Laterality: Left;  . ABDOMINAL HYSTERECTOMY    . APPENDECTOMY    . AV FISTULA PLACEMENT Left 11/13/2019   Procedure: INSERTION OF ARTERIOVENOUS (AV) GORE-TEX GRAFT ARM ( BRACHIAL AXILLARY );  Surgeon: Katha Cabal, MD;  Location: [facility];   Service: Vascular;  Laterality: Left;  . DIALYSIS/PERMA CATHETER INSERTION N/A 06/07/2019   Procedure: DIALYSIS/PERMA CATHETER INSERTION;  Surgeon: Katha Cabal, MD;  Location: [place] [facility];  Service: Cardiovascular;  Laterality: N/A;  . DIALYSIS/PERMA CATHETER REMOVAL N/A 02/11/2020   Procedure: DIALYSIS/PERMA CATHETER REMOVAL;  Surgeon: Katha Cabal, MD;  Location: [place] [facility];  Service: Cardiovascular;  Laterality: N/A;  . EYE SURGERY    . HERNIA REPAIR    . INCISION AND DRAINAGE PERIRECTAL ABSCESS    . INGUINAL HERNIA REPAIR N/A 07/12/2020   Procedure: HERNIA REPAIR INGUINAL ADULT;  Surgeon: Olean Ree, MD;  Location: [facility];  Service: General;  Laterality: N/A;  . KNEE SURGERY    . THYROID SURGERY    . VAGINAL HYSTERECTOMY      Prior to Admission medications   Medication Sig Start Date End Date Taking? Authorizing Provider  acetaminophen (TYLENOL) 500 MG tablet Take 500-1,000 mg by mouth every 6 (six) hours as needed for mild pain or fever.     [provider]  albuterol (VENTOLIN HFA) 108 (90 Base) MCG/ACT inhaler TAKE 2 PUFFS BY MOUTH EVERY 6 HOURS AS NEEDED FOR WHEEZE OR SHORTNESS OF BREATH 10/05/19   Jerrol Banana., MD  amLODipine (NORVASC) 5 MG tablet Take 1 tablet (5 mg total) by mouth daily. 05/20/20   Jerrol Banana., MD  Bepotastine Besilate 1.5 % SOLN Place 1 drop into both eyes in the morning and at bedtime. Morning & afternoon.    [provider]  brimonidine (ALPHAGAN) 0.2 % ophthalmic solution Place 1 drop into both eyes 2 (two) times daily.     [provider]  calcitRIOL (ROCALTROL) 0.25 MCG capsule Take 0.25 mcg by mouth every Monday, Wednesday, and Friday.  10/23/18   [provider]  carvedilol (COREG) 12.5 MG tablet TAKE 1 TABLET (12.5 MG TOTAL) BY MOUTH 2 (TWO) TIMES DAILY. 04/26/20   Jerrol Banana., MD  [facility] ASPIRIN ADULT LOW DOSE 81 MG chewable tablet CHEW 1 TABLET (81 MG TOTAL) BY  MOUTH DAILY. 05/10/20   Jerrol Banana., MD  dorzolamide-timolol (COSOPT) 22.3-6.8 MG/ML ophthalmic solution Place 1 drop into both eyes 2 (two) times daily.  03/06/19   [provider]  latanoprost (XALATAN) 0.005 % ophthalmic solution Place 1 drop into both eyes at bedtime. 02/11/15   [provider]  loratadine (CLARITIN) 10 MG tablet Take 1 tablet (10 mg total) by mouth daily. Patient taking differently: Take 10 mg by mouth daily as needed for allergies. 12/15/14   Jerrol Banana., MD  multivitamin (RENA-VIT) TABS tablet Take 1 tablet by mouth daily.  09/13/19   [provider]  OXYGEN Inhale 2 L into the lungs at bedtime.    [provider]  sertraline (ZOLOFT) 25 MG tablet TAKE 1 TABLET BY MOUTH EVERY DAY Patient taking differently: Take 25 mg by mouth daily. 01/17/20   Jerrol Banana., MD  telmisartan (MICARDIS) 40 MG tablet Take 40 mg by mouth daily.    [provider]  torsemide (DEMADEX) 100 MG tablet Take 100 mg by mouth daily.    [provider]  traZODone (DESYREL) 150 MG tablet TAKE  1 TABLET (150 MG TOTAL) BY MOUTH AT BEDTIME. 05/07/20   Jerrol Banana., MD    Allergies Antihistamines, chlorpheniramine-type; Paxil [paroxetine]; and Codeine  Family History  Problem Relation Age of Onset  . Heart attack Mother   . Heart disease Sister   . Cancer Sister     Social History Social History   Tobacco Use  . Smoking status: Former Smoker    Types: Cigarettes  . Smokeless tobacco: Never Used  . Tobacco comment: Quit in 2015-2016  Vaping Use  . Vaping Use: Never used  Substance Use Topics  . Alcohol use: No  . Drug use: No    Review of Systems  Review of Systems  Constitutional: Negative for chills and fever.  HENT: Negative for sore throat.   Eyes: Negative for pain.  Respiratory: Negative for cough and stridor.   Cardiovascular: Negative for chest pain.  Gastrointestinal: Positive for  abdominal pain. Negative for vomiting.  Skin: Negative for rash.  Neurological: Negative for seizures, loss of consciousness and headaches.  Psychiatric/Behavioral: Negative for suicidal ideas.  All other systems reviewed and are negative.     ____________________________________________   PHYSICAL EXAM:  VITAL SIGNS: ED Triage Vitals  Enc Vitals Group     BP      Pulse      Resp      Temp      Temp src      SpO2      Weight      Height      Head Circumference      Peak Flow      Pain Score      Pain Loc      Pain Edu?      Excl. in [place]?    Vitals:   07/17/20 0858 07/17/20 0859  BP:    Pulse: 60   Resp: 12   Temp:  98.1 F (36.7 C)  SpO2: 99%    Physical Exam Vitals and nursing note reviewed.  Constitutional:      General: She is not in acute distress.    Appearance: She is well-developed.  HENT:     Head: Normocephalic and atraumatic.     Right Ear: External ear normal.     Left Ear: External ear normal.  Eyes:     Conjunctiva/sclera: Conjunctivae normal.  Cardiovascular:     Rate and Rhythm: Normal rate and regular rhythm.     Heart sounds: No murmur heard.   Pulmonary:     Effort: Pulmonary effort is normal. No respiratory distress.     Breath sounds: Normal breath sounds.  Abdominal:     Palpations: Abdomen is soft.     Tenderness: There is abdominal tenderness in the left lower quadrant. There is no right CVA tenderness or left CVA tenderness.  Musculoskeletal:     Cervical back: Neck supple.  Skin:    General: Skin is warm and dry.     Capillary Refill: Capillary refill takes less than 2 seconds.  Neurological:     Mental Status: She is alert and oriented to person, place, and time.  Psychiatric:        Mood and Affect: Mood normal.     Is a blood clot around the drain insertion site at the incision and staples otherwise appears clean dry and intact. ____________________________________________   LABS (all labs ordered are listed, but  only abnormal results are displayed)  Labs Reviewed  CBC WITH DIFFERENTIAL/PLATELET - Abnormal; Notable  for the following components:      Result Value   RBC 3.65 (*)    Hemoglobin 10.4 (*)    HCT 33.7 (*)    All other components within normal limits  COMPREHENSIVE METABOLIC PANEL - Abnormal; Notable for the following components:   Glucose, Bld 103 (*)    BUN 31 (*)    Creatinine, Ser 3.25 (*)    Total Protein 6.1 (*)    Albumin 3.2 (*)    GFR, Estimated 13 (*)    All other components within normal limits  RESP PANEL BY RT-PCR (FLU A&B, COVID) ARPGX2  PROTIME-INR  URINALYSIS, COMPLETE (UACMP) WITH MICROSCOPIC  TYPE AND SCREEN   ____________________________________________  EKG  Sinus rhythm with ventricular rate of 63, normal axis, unremarkable intervals and no clear evidence of acute ischemia. ____________________________________________  RADIOLOGY  ED MD interpretation: Status post left inguinal hernia repair with hematoma the abdominal wall 6.6 x 5.6 cm with drain in position.  No evidence of diverticulitis or other acute abdominal pelvic pathology.  Official radiology report(s): CT ABDOMEN PELVIS WO CONTRAST  Result Date: 07/17/2020 CLINICAL DATA:  Abdominal pain, hernia surgery last week, bleeding around JP drain EXAM: CT ABDOMEN AND PELVIS WITHOUT CONTRAST TECHNIQUE: Multidetector CT imaging of the abdomen and pelvis was performed following the standard protocol without IV contrast. COMPARISON:  07/11/2020 FINDINGS: Lower chest: Cardiomegaly. Unchanged small pericardial effusion. Three-vessel coronary artery calcifications. Hepatobiliary: No solid liver abnormality is seen. Gallstones in the contracted gallbladder. No gallbladder wall thickening, or biliary dilatation. Pancreas: Unremarkable. No pancreatic ductal dilatation or surrounding inflammatory changes. Spleen: Unchanged fluid attenuation splenic cyst or hemangioma (series 2, image 28). Adrenals/Urinary Tract: Adrenal  glands are unremarkable. Kidneys are normal, without renal calculi, solid lesion, or hydronephrosis. Bladder is unremarkable. Stomach/Bowel: Stomach is within normal limits. Appendix appears normal. There are fluid-filled, nonobstructed loops of bowel in the abdomen. Gas is present to the rectum. Sigmoid diverticulosis. Moderate burden of stool. Vascular/Lymphatic: Aortic atherosclerosis. No enlarged abdominal or pelvic lymph nodes. Reproductive: Status post hysterectomy. Other: Interval evidence of left inguinal hernia repair. There is a hematoma within the abdominal wall at this site measuring 6.6 x 5.6 cm (series 2, image 91). Surgical drain is positioned about this hematoma. No abdominopelvic ascites. Musculoskeletal: No acute or significant osseous findings. IMPRESSION: 1. Interval evidence of left inguinal hernia repair. There is a hematoma within the abdominal wall at this site measuring 6.6 x 5.6 cm. Surgical drain is positioned about this hematoma. 2. No evidence of persistent or recurrent bowel obstruction. 3. Sigmoid diverticulosis. 4. Cholelithiasis. 5. Cardiomegaly and coronary artery disease. Aortic Atherosclerosis (ICD10-I70.0). Electronically Signed   By: Eddie Candle M.D.   On: 07/17/2020 09:49    ____________________________________________   PROCEDURES  Procedure(s) performed (including Critical Care):  .1-3 Lead EKG Interpretation Performed by: Lucrezia Starch, MD Authorized by: Lucrezia Starch, MD     Interpretation: normal     ECG rate assessment: normal     Rhythm: sinus rhythm     Ectopy: none     Conduction: normal       ____________________________________________   INITIAL IMPRESSION / ASSESSMENT AND PLAN / ED COURSE      Patient presents with above-stated exam for assessment of some bleeding and pain around [facility] drain was placed after operative reduction of incarcerated left inguinal hernia on 3/13.  On arrival patient is still hypertensive otherwise  stable vital signs on room air.  Incision site appears clean dry and intact  although some bruising around incision site of the [place] drain.  The balloon is essentially empty.  CT shows it is in position with location of a hematoma in the abdominal wall does not seem to be appropriately draining.  Suspect this is etiology of patient's pain as there is no findings on CT of diverticulitis, SBO, abscess or other acute abdominopelvic pathology.  No evidence on exam of acute infection.  CBC without leukocytosis and hemoglobin at baseline at 10.4 compared to 10.74 days ago.  CMP remarkable for creatinine of 3.25 and a BUN of 31 consistent with patient's known history of ESRD without any other significant derangements.  INR is unremarkable.  I did reach out to on-call surgeon Dr. Milas Gain who will admit the patient to their service for observation. ____________________________________________   FINAL CLINICAL IMPRESSION(S) / ED DIAGNOSES  Final diagnoses:  Abdominal wall hematoma, subsequent encounter  ESRD (end stage renal disease) (HCC)    Medications  fentaNYL (SUBLIMAZE) injection 50 mcg (50 mcg Intravenous Given 07/17/20 0856)     ED Discharge Orders    None       Note:  This document was prepared using Dragon voice recognition software and may include unintentional dictation errors.   Lucrezia Starch, MD 07/17/20 1158

## 2020-07-17 NOTE — ED Triage Notes (Signed)
pt arrives via ems from from home. ems reports hemorrhage from surgical site, pt had hernia surgery last week. JP drain present left lower abdomen. bleeding noted around JP drain, visible clot present around wound drain tubing at insertion site. Pt a&o x 4 on arrival. NAD noted at this time MD present for assessment.

## 2020-07-17 NOTE — ED Notes (Signed)
Pt up OOB with walker at this time, pt tolerated well. Pt changed into clean gown at this time, warm blanket given.

## 2020-07-17 NOTE — ED Notes (Signed)
Surgery at bedside at this time. Will collect covid swab after.

## 2020-07-17 NOTE — H&P (Signed)
[facility] SURGICAL HISTORY & PHYSICAL (cpt [phone])  HISTORY OF PRESENT ILLNESS (HPI):  87 y.o. female well known to our service following emergent left incarcerated femoral hernia repair on 03/13 with Dr Hampton Abbot. She did well and was discharged home on 03/15. She reports that she was doing at home until earlier this morning. She reports the acute onset of bleeding around her drain site earlier this morning. This was accompanied with LLQ abdominal soreness as well. Her drain had been functioning normal until this point. She denied any fever, chills, nausea, emesis. She is tolerating PO and having normal bowel function. Otherwise, had been doing well. She is on 81 mg ASA but no other blood thinning medications. Work up in the ED revealed Hgb of 10.4 (10.7 at discharge), normal WBC at 7.5, renal function is baseline with her history of ESRD 3.25. She did have CT Abdomen/Pelvis which is concerning for large hematoma in her operative site; however, the drain is located within this.   General surgery is consulted by emergency medicine physician Dr Hulan Saas, MD for evaluation and management of post-operative hematoma.    PAST MEDICAL HISTORY (PMH):  Past Medical History:  Diagnosis Date  . Acute heart failure ([place])   . Anemia   . Anxiety   . CHF (congestive heart failure) ([place])   . Chronic kidney disease    stage V; end stage renal disease  . Chronic low back pain   . Depression   . Diabetes mellitus without complication ([place])   . GERD (gastroesophageal reflux disease)   . Hyperlipidemia   . Hypertension   . MI (myocardial infarction) ([place])   . Obesity     Reviewed. Otherwise negative.   PAST SURGICAL HISTORY ([place]):  Past Surgical History:  Procedure Laterality Date  . A/V FISTULAGRAM Left 01/15/2020   Procedure: A/V FISTULAGRAM;  Surgeon: Katha Cabal, MD;  Location: [facility];  Service: Cardiovascular;  Laterality: Left;  . ABDOMINAL HYSTERECTOMY     . APPENDECTOMY    . AV FISTULA PLACEMENT Left 11/13/2019   Procedure: INSERTION OF ARTERIOVENOUS (AV) GORE-TEX GRAFT ARM ( BRACHIAL AXILLARY );  Surgeon: Katha Cabal, MD;  Location: [facility];  Service: Vascular;  Laterality: Left;  . DIALYSIS/PERMA CATHETER INSERTION N/A 06/07/2019   Procedure: DIALYSIS/PERMA CATHETER INSERTION;  Surgeon: Katha Cabal, MD;  Location: [facility];  Service: Cardiovascular;  Laterality: N/A;  . DIALYSIS/PERMA CATHETER REMOVAL N/A 02/11/2020   Procedure: DIALYSIS/PERMA CATHETER REMOVAL;  Surgeon: Katha Cabal, MD;  Location: [facility];  Service: Cardiovascular;  Laterality: N/A;  . EYE SURGERY    . HERNIA REPAIR    . INCISION AND DRAINAGE PERIRECTAL ABSCESS    . INGUINAL HERNIA REPAIR N/A 07/12/2020   Procedure: HERNIA REPAIR INGUINAL ADULT;  Surgeon: Olean Ree, MD;  Location: [facility];  Service: General;  Laterality: N/A;  . KNEE SURGERY    . THYROID SURGERY    . VAGINAL HYSTERECTOMY      Reviewed. Otherwise negative.   MEDICATIONS:  Prior to Admission medications   Medication Sig Start Date End Date Taking? Authorizing Provider  acetaminophen (TYLENOL) 500 MG tablet Take 500-1,000 mg by mouth every 6 (six) hours as needed for mild pain or fever.     [provider]  albuterol (VENTOLIN HFA) 108 (90 Base) MCG/ACT inhaler TAKE 2 PUFFS BY MOUTH EVERY 6 HOURS AS NEEDED FOR WHEEZE OR SHORTNESS OF BREATH 10/05/19   Eulas Post  Brooke Bonito., MD  amLODipine (NORVASC) 5 MG tablet Take 1 tablet (5 mg total) by mouth daily. 05/20/20   Jerrol Banana., MD  Bepotastine Besilate 1.5 % SOLN Place 1 drop into both eyes in the morning and at bedtime. Morning & afternoon.    [provider]  brimonidine (ALPHAGAN) 0.2 % ophthalmic solution Place 1 drop into both eyes 2 (two) times daily.     [provider]  calcitRIOL (ROCALTROL) 0.25 MCG capsule Take 0.25 mcg by mouth every Monday, Wednesday, and Friday.   10/23/18   [provider]  carvedilol (COREG) 12.5 MG tablet TAKE 1 TABLET (12.5 MG TOTAL) BY MOUTH 2 (TWO) TIMES DAILY. 04/26/20   Jerrol Banana., MD  [facility] ASPIRIN ADULT LOW DOSE 81 MG chewable tablet CHEW 1 TABLET (81 MG TOTAL) BY MOUTH DAILY. 05/10/20   Jerrol Banana., MD  dorzolamide-timolol (COSOPT) 22.3-6.8 MG/ML ophthalmic solution Place 1 drop into both eyes 2 (two) times daily.  03/06/19   [provider]  latanoprost (XALATAN) 0.005 % ophthalmic solution Place 1 drop into both eyes at bedtime. 02/11/15   [provider]  loratadine (CLARITIN) 10 MG tablet Take 1 tablet (10 mg total) by mouth daily. Patient taking differently: Take 10 mg by mouth daily as needed for allergies. 12/15/14   Jerrol Banana., MD  multivitamin (RENA-VIT) TABS tablet Take 1 tablet by mouth daily.  09/13/19   [provider]  OXYGEN Inhale 2 L into the lungs at bedtime.    [provider]  sertraline (ZOLOFT) 25 MG tablet TAKE 1 TABLET BY MOUTH EVERY DAY Patient taking differently: Take 25 mg by mouth daily. 01/17/20   Jerrol Banana., MD  telmisartan (MICARDIS) 40 MG tablet Take 40 mg by mouth daily.    [provider]  torsemide (DEMADEX) 100 MG tablet Take 100 mg by mouth daily.    [provider]  traZODone (DESYREL) 150 MG tablet TAKE 1 TABLET (150 MG TOTAL) BY MOUTH AT BEDTIME. 05/07/20   Jerrol Banana., MD     ALLERGIES:  Allergies  Allergen Reactions  . Antihistamines, Chlorpheniramine-Type Other (See Comments)    Unknown reaction.  [name] Paxil [Paroxetine]     "makes her feel funny" not in a good way  . Codeine Rash and Other (See Comments)    Mouth sore     SOCIAL HISTORY:  Social History   Socioeconomic History  . Marital status: Widowed    Spouse name: Not on file  . Number of children: 1  . Years of education: Not on file  . Highest education level: 11th grade  Occupational History  . Occupation:  retired  Tobacco Use  . Smoking status: Former Smoker    Types: Cigarettes  . Smokeless tobacco: Never Used  . Tobacco comment: Quit in 2015-2016  Vaping Use  . Vaping Use: Never used  Substance and Sexual Activity  . Alcohol use: No  . Drug use: No  . Sexual activity: Never  Other Topics Concern  . Not on file  Social History Narrative   Lives in [place]; self; friends do chores; quit smoking many years; no alcohol. Worked in [employer].    Social Determinants of Health   Financial Resource Strain: Low Risk   . Difficulty of Paying Living Expenses: Not hard at all  Food Insecurity: No Food Insecurity  . Worried About Charity fundraiser in the Last Year: Never true  .  Ran Out of Food in the Last Year: Never true  Transportation Needs: No Transportation Needs  . Lack of Transportation (Medical): No  . Lack of Transportation (Non-Medical): No  Physical Activity: Inactive  . Days of Exercise per Week: 0 days  . Minutes of Exercise per Session: 0 min  Stress: No Stress Concern Present  . Feeling of Stress : Not at all  Social Connections: Socially Isolated  . Frequency of Communication with Friends and Family: More than three times a week  . Frequency of Social Gatherings with Friends and Family: Three times a week  . Attends Religious Services: Never  . Active Member of Clubs or Organizations: No  . Attends Archivist Meetings: Never  . Marital Status: Widowed  Intimate Partner Violence: Not At Risk  . Fear of Current or Ex-Partner: No  . Emotionally Abused: No  . Physically Abused: No  . Sexually Abused: No     FAMILY HISTORY:  Family History  Problem Relation Age of Onset  . Heart attack Mother   . Heart disease Sister   . Cancer Sister     Otherwise negative.   REVIEW OF SYSTEMS:  Review of Systems  Constitutional: Negative for chills and fever.  HENT: Negative for congestion and sore throat.   Respiratory: Negative for cough and shortness of  breath.   Cardiovascular: Negative for chest pain and palpitations.  Gastrointestinal: Positive for abdominal pain. Negative for blood in stool, constipation, diarrhea, nausea and vomiting.  Genitourinary: Negative for dysuria and urgency.  All other systems reviewed and are negative.   VITAL SIGNS:  Temp:  [98.1 F (36.7 C)] 98.1 F (36.7 C) (03/18 0859) Pulse Rate:  [60-64] 60 (03/18 0858) Resp:  [12-20] 12 (03/18 0858) BP: (160-163)/(45-53) 160/53 (03/18 0820) SpO2:  [95 %-99 %] 99 % (03/18 0858) Weight:  [56 kg] 56 kg (03/18 0819)     Height: '5\' 3"'$  (160 cm) Weight: 56 kg BMI (Calculated): 21.88   PHYSICAL EXAM:  Physical Exam Vitals and nursing note reviewed. Exam conducted with a chaperone present.  Constitutional:      General: She is not in acute distress.    Appearance: Normal appearance. She is normal weight. She is not diaphoretic.  HENT:     Head: Normocephalic and atraumatic.  Eyes:     General: No scleral icterus.    Conjunctiva/sclera: Conjunctivae normal.  Pulmonary:     Effort: Pulmonary effort is normal. No respiratory distress.  Abdominal:     General: There is no distension.     Palpations: Abdomen is soft.     Tenderness: There is abdominal tenderness. There is no guarding or rebound.       Comments: Abdomen is soft, she is tender over her incision site in left groin, there is induration in this area consistent with likely coagulated hematoma. Surgical drain is lateral to this incision. There is bloody drainage in bulb and around tubing. I did spend significant timing 'milking' this and got about 50-100 ccs out.    Genitourinary:    Comments: Deferred Musculoskeletal:     Right lower leg: No edema.     Left lower leg: No edema.  Skin:    General: Skin is warm and dry.  Neurological:     General: No focal deficit present.     Mental Status: She is alert and oriented to person, place, and time. Mental status is at baseline.  Psychiatric:        Mood  and Affect: Mood normal.        Behavior: Behavior normal.     INTAKE/OUTPUT:  This shift: No intake/output data recorded.  Last 2 shifts: '@IOLAST2SHIFTS'$ @  Labs:  CBC Latest Ref Rng & Units 07/17/2020 07/13/2020 07/12/2020  WBC 4.0 - 10.5 K/uL 7.5 9.1 10.6(H)  Hemoglobin 12.0 - 15.0 g/dL 10.4(L) 10.7(L) 11.4(L)  Hematocrit 36.0 - 46.0 % 33.7(L) 32.7(L) 36.0  Platelets 150 - 400 K/uL 262 224 239   CMP Latest Ref Rng & Units 07/17/2020 07/13/2020 07/12/2020  Glucose 70 - 99 mg/dL 103(H) 96 196(H)  BUN 8 - 23 mg/dL 31(H) 57(H) 51(H)  Creatinine 0.44 - 1.00 mg/dL 3.25(H) 3.85(H) 3.93(H)  Sodium 135 - 145 mmol/L 139 137 137  Potassium 3.5 - 5.1 mmol/L 4.6 3.7 4.3  Chloride 98 - 111 mmol/L 103 105 104  CO2 22 - 32 mmol/L '29 25 23  '$ Calcium 8.9 - 10.3 mg/dL 9.4 8.7(L) 9.1  Total Protein 6.5 - 8.1 g/dL 6.1(L) - -  Total Bilirubin 0.3 - 1.2 mg/dL 0.6 - -  Alkaline Phos 38 - 126 U/L 65 - -  AST 15 - 41 U/L 17 - -  ALT 0 - 44 U/L 14 - -     Imaging studies:   CT Abdomen/Pelvis (07/17/2020) personally reviewed showing large post-operative hematoma, drain is present in this, otherwise expected post-operative changes, and radiologist report reviewed:  IMPRESSION: 1. Interval evidence of left inguinal hernia repair. There is a hematoma within the abdominal wall at this site measuring 6.6 x 5.6 cm. Surgical drain is positioned about this hematoma. 2. No evidence of persistent or recurrent bowel obstruction. 3. Sigmoid diverticulosis. 4. Cholelithiasis. 5. Cardiomegaly and coronary artery disease.    Assessment/Plan: (ICD-10's: T14.8XXA) 87 y.o. female with post-operative hematoma 5 days s/p Open Left Incarcerated FemoralHernia Repair, complicated by pertinent comorbidities including ESRD.    - I spent significant time at bedside attempting to 'strip' her drain. I was able to get about 50-100 ccs of sanguinous output. Additionally, I changed out her JP bulb. I suspect there is a significant  coagulated hematoma underneath the surgical incision. There is no evidence of infection nor infected hematoma.   - I do think given her age and comorbidities, it is prudent to observe her to ensure no further bleeding, her Hgb remains stable, and there are no further issues with the drain. Patient is in agreement with this plan.   - Will admit to general surgery for observation  - Monitor and record drain output  - Okay for regular diet  - Monitor H&H; morning CBC   - Monitor abdominal examination  - Pain control prn; avoid NSAIDs  - Home medications; hold ASA  - DVT prophylaxis; hold  All of the above findings and recommendations were discussed with the patient and her sister at bedside, and all of their questions were answered to their expressed satisfaction.  -- Edison Simon, PA-C [facility] 07/17/2020, 11:37 AM [phone] M-F: 7am - 4pm

## 2020-07-18 DIAGNOSIS — I2510 Atherosclerotic heart disease of native coronary artery without angina pectoris: Secondary | ICD-10-CM | POA: Diagnosis present

## 2020-07-18 DIAGNOSIS — S301XXA Contusion of abdominal wall, initial encounter: Secondary | ICD-10-CM | POA: Diagnosis present

## 2020-07-18 DIAGNOSIS — Z6821 Body mass index (BMI) 21.0-21.9, adult: Secondary | ICD-10-CM | POA: Diagnosis not present

## 2020-07-18 DIAGNOSIS — Z90710 Acquired absence of both cervix and uterus: Secondary | ICD-10-CM | POA: Diagnosis not present

## 2020-07-18 DIAGNOSIS — F419 Anxiety disorder, unspecified: Secondary | ICD-10-CM | POA: Diagnosis present

## 2020-07-18 DIAGNOSIS — G8929 Other chronic pain: Secondary | ICD-10-CM | POA: Diagnosis present

## 2020-07-18 DIAGNOSIS — Z20822 Contact with and (suspected) exposure to covid-19: Secondary | ICD-10-CM | POA: Diagnosis present

## 2020-07-18 DIAGNOSIS — J309 Allergic rhinitis, unspecified: Secondary | ICD-10-CM | POA: Diagnosis present

## 2020-07-18 DIAGNOSIS — Z992 Dependence on renal dialysis: Secondary | ICD-10-CM | POA: Diagnosis not present

## 2020-07-18 DIAGNOSIS — Z79891 Long term (current) use of opiate analgesic: Secondary | ICD-10-CM | POA: Diagnosis not present

## 2020-07-18 DIAGNOSIS — F32A Depression, unspecified: Secondary | ICD-10-CM | POA: Diagnosis present

## 2020-07-18 DIAGNOSIS — L7632 Postprocedural hematoma of skin and subcutaneous tissue following other procedure: Secondary | ICD-10-CM | POA: Diagnosis present

## 2020-07-18 DIAGNOSIS — E1122 Type 2 diabetes mellitus with diabetic chronic kidney disease: Secondary | ICD-10-CM | POA: Diagnosis present

## 2020-07-18 DIAGNOSIS — K219 Gastro-esophageal reflux disease without esophagitis: Secondary | ICD-10-CM | POA: Diagnosis present

## 2020-07-18 DIAGNOSIS — N186 End stage renal disease: Secondary | ICD-10-CM

## 2020-07-18 DIAGNOSIS — Y838 Other surgical procedures as the cause of abnormal reaction of the patient, or of later complication, without mention of misadventure at the time of the procedure: Secondary | ICD-10-CM | POA: Diagnosis present

## 2020-07-18 DIAGNOSIS — D631 Anemia in chronic kidney disease: Secondary | ICD-10-CM | POA: Diagnosis present

## 2020-07-18 DIAGNOSIS — D500 Iron deficiency anemia secondary to blood loss (chronic): Secondary | ICD-10-CM | POA: Diagnosis not present

## 2020-07-18 DIAGNOSIS — E785 Hyperlipidemia, unspecified: Secondary | ICD-10-CM | POA: Diagnosis present

## 2020-07-18 DIAGNOSIS — D62 Acute posthemorrhagic anemia: Secondary | ICD-10-CM | POA: Diagnosis present

## 2020-07-18 DIAGNOSIS — N2581 Secondary hyperparathyroidism of renal origin: Secondary | ICD-10-CM | POA: Diagnosis present

## 2020-07-18 DIAGNOSIS — I5032 Chronic diastolic (congestive) heart failure: Secondary | ICD-10-CM | POA: Diagnosis present

## 2020-07-18 DIAGNOSIS — Z7982 Long term (current) use of aspirin: Secondary | ICD-10-CM | POA: Diagnosis not present

## 2020-07-18 DIAGNOSIS — L7622 Postprocedural hemorrhage and hematoma of skin and subcutaneous tissue following other procedure: Secondary | ICD-10-CM | POA: Diagnosis not present

## 2020-07-18 DIAGNOSIS — I132 Hypertensive heart and chronic kidney disease with heart failure and with stage 5 chronic kidney disease, or end stage renal disease: Secondary | ICD-10-CM | POA: Diagnosis present

## 2020-07-18 DIAGNOSIS — E669 Obesity, unspecified: Secondary | ICD-10-CM | POA: Diagnosis present

## 2020-07-18 DIAGNOSIS — I252 Old myocardial infarction: Secondary | ICD-10-CM | POA: Diagnosis not present

## 2020-07-18 LAB — RENAL FUNCTION PANEL
Albumin: 2.7 g/dL — ABNORMAL LOW (ref 3.5–5.0)
Anion gap: 6 (ref 5–15)
BUN: 44 mg/dL — ABNORMAL HIGH (ref 8–23)
CO2: 26 mmol/L (ref 22–32)
Calcium: 8.5 mg/dL — ABNORMAL LOW (ref 8.9–10.3)
Chloride: 102 mmol/L (ref 98–111)
Creatinine, Ser: 3.98 mg/dL — ABNORMAL HIGH (ref 0.44–1.00)
GFR, Estimated: 10 mL/min — ABNORMAL LOW (ref >60)
Glucose, Bld: 133 mg/dL — ABNORMAL HIGH (ref 70–99)
Phosphorus: 3.9 mg/dL (ref 2.5–4.6)
Potassium: 4.1 mmol/L (ref 3.5–5.1)
Sodium: 134 mmol/L — ABNORMAL LOW (ref 135–145)

## 2020-07-18 LAB — CBC
HCT: 23.9 % — ABNORMAL LOW (ref 36.0–46.0)
HCT: 22.3 % — ABNORMAL LOW (ref 36.0–46.0)
Hemoglobin: 7.5 g/dL — ABNORMAL LOW (ref 12.0–15.0)
Hemoglobin: 7 g/dL — ABNORMAL LOW (ref 12.0–15.0)
MCH: 28.7 pg (ref 26.0–34.0)
MCH: 28.6 pg (ref 26.0–34.0)
MCHC: 31.4 g/dL (ref 30.0–36.0)
MCHC: 31.4 g/dL (ref 30.0–36.0)
MCV: 91.6 fL (ref 80.0–100.0)
MCV: 91 fL (ref 80.0–100.0)
Platelets: 206 10*3/uL (ref 150–400)
Platelets: 213 10*3/uL (ref 150–400)
RBC: 2.61 MIL/uL — ABNORMAL LOW (ref 3.87–5.11)
RBC: 2.45 MIL/uL — ABNORMAL LOW (ref 3.87–5.11)
RDW: 14 % (ref 11.5–15.5)
RDW: 14.1 % (ref 11.5–15.5)
WBC: 6.9 10*3/uL (ref 4.0–10.5)
WBC: 7.3 10*3/uL (ref 4.0–10.5)
nRBC: 0 % (ref 0.0–0.2)
nRBC: 0 % (ref 0.0–0.2)

## 2020-07-18 LAB — BASIC METABOLIC PANEL
Anion gap: 6 (ref 5–15)
BUN: 40 mg/dL — ABNORMAL HIGH (ref 8–23)
CO2: 26 mmol/L (ref 22–32)
Calcium: 8.7 mg/dL — ABNORMAL LOW (ref 8.9–10.3)
Chloride: 103 mmol/L (ref 98–111)
Creatinine, Ser: 3.89 mg/dL — ABNORMAL HIGH (ref 0.44–1.00)
GFR, Estimated: 11 mL/min — ABNORMAL LOW (ref >60)
Glucose, Bld: 142 mg/dL — ABNORMAL HIGH (ref 70–99)
Potassium: 4.4 mmol/L (ref 3.5–5.1)
Sodium: 135 mmol/L (ref 135–145)

## 2020-07-18 MED ORDER — LIDOCAINE-PRILOCAINE 2.5-2.5 % EX CREA
1.0000 "application " | TOPICAL_CREAM | CUTANEOUS | Status: DC | PRN
  Filled 2020-07-18: qty 5

## 2020-07-18 MED ORDER — ALTEPLASE 2 MG IJ SOLR: 2.0000 mg | Freq: Once | INTRAMUSCULAR | Status: DC | PRN

## 2020-07-18 MED ORDER — PENTAFLUOROPROP-TETRAFLUOROETH EX AERO
1.0000 "application " | INHALATION_SPRAY | CUTANEOUS | Status: DC | PRN
  Filled 2020-07-18: qty 30

## 2020-07-18 MED ORDER — SODIUM CHLORIDE 0.9 % IV SOLN: 100.0000 mL | INTRAVENOUS | Status: DC | PRN

## 2020-07-18 MED ORDER — CHLORHEXIDINE GLUCONATE CLOTH 2 % EX PADS
6.0000 | MEDICATED_PAD | Freq: Every day | CUTANEOUS | Status: DC
  Administered 2020-07-19 – 2020-07-20 (×2): 6 via TOPICAL

## 2020-07-18 MED ORDER — LIDOCAINE HCL (PF) 1 % IJ SOLN
5.0000 mL | INTRAMUSCULAR | Status: DC | PRN
  Filled 2020-07-18: qty 5

## 2020-07-18 MED ORDER — HEPARIN SODIUM (PORCINE) 1000 UNIT/ML DIALYSIS: 1000.0000 [IU] | INTRAMUSCULAR | Status: DC | PRN

## 2020-07-18 NOTE — Plan of Care (Signed)

## 2020-07-18 NOTE — Progress Notes (Addendum)
[facility] Hospital Day(s): 0.   Post op day(s):  [name]   Interval History: Patient seen and examined, no acute events or new complaints overnight. Patient reports left groin pain and swelling significantly improved.  She has hemodialysis twice weekly, her last course was Tuesday while she was in the hospital after her operation.  She is concerned about getting her dialysis completed, and I reached out to dialysis nurses and her nephrologist.   She complained of her hand cramping.  She has had this before.  It is the same extremity as her HD access.  Review of Systems:  Constitutional: denies fever, chills  Respiratory: denies any shortness of breath  Cardiovascular: denies chest pain or palpitations  Gastrointestinal: denies abdominal pain, N/V, or diarrhea Musculoskeletal: denies pain, decreased motor or sensation Integumentary: denies any other rashes or skin discolorations  Vital signs in last 24 hours: [min-max] current  Temp:  [98 F (36.7 C)-98.5 F (36.9 C)] 98.5 F (36.9 C) (03/19 1042) Pulse Rate:  [65-71] 71 (03/19 1042) Resp:  [12-20] 16 (03/19 1042) BP: (110-150)/(40-62) 110/40 (03/19 1042) SpO2:  [93 %-97 %] 96 % (03/19 1042) Weight:  [55 kg] 55 kg (03/18 1926)     Height: '5\' 3"'$  (160 cm) Weight: 55 kg BMI (Calculated): 21.48   Intake/Output last 2 shifts:  03/18 0701 - 03/19 0700 In: -  Out: 20 [Drains:20]   Physical Exam:  Constitutional: alert, cooperative and no distress  Respiratory: breathing non-labored at rest  Cardiovascular: regular rate and sinus rhythm  Gastrointestinal: soft, non-tender, and non-distended Integumentary: Left groin staples are clean dry and intact.  Drain continues to be serosanguineous, minimal volume present.  No blood noted around the drain, no appreciable reaccumulation.  Pulsatile access noted left arm.  The left extremity is warm to touch, to fingertips.  Labs:  CBC Latest Ref Rng & Units  07/18/2020 07/17/2020 07/13/2020  WBC 4.0 - 10.5 K/uL 6.9 7.5 9.1  Hemoglobin 12.0 - 15.0 g/dL 7.5(L) 10.4(L) 10.7(L)  Hematocrit 36.0 - 46.0 % 23.9(L) 33.7(L) 32.7(L)  Platelets 150 - 400 K/uL 206 262 224   CMP Latest Ref Rng & Units 07/18/2020 07/17/2020 07/13/2020  Glucose 70 - 99 mg/dL 142(H) 103(H) 96  BUN 8 - 23 mg/dL 40(H) 31(H) 57(H)  Creatinine 0.44 - 1.00 mg/dL 3.89(H) 3.25(H) 3.85(H)  Sodium 135 - 145 mmol/L 135 139 137  Potassium 3.5 - 5.1 mmol/L 4.4 4.6 3.7  Chloride 98 - 111 mmol/L 103 103 105  CO2 22 - 32 mmol/L '26 29 25  '$ Calcium 8.9 - 10.3 mg/dL 8.7(L) 9.4 8.7(L)  Total Protein 6.5 - 8.1 g/dL - 6.1(L) -  Total Bilirubin 0.3 - 1.2 mg/dL - 0.6 -  Alkaline Phos 38 - 126 U/L - 65 -  AST 15 - 41 U/L - 17 -  ALT 0 - 44 U/L - 14 -     Imaging studies: No new pertinent imaging studies   Assessment/Plan:  87 y.o. female with drained hematoma   s/p hernia repair, and now with what appears to be acute blood loss anemia without evidence of active hemorrhage. Complicated by pertinent comorbidities including:  Patient Active Problem List   Diagnosis Date Noted  . Hematoma 07/17/2020  . Chronic diastolic CHF (congestive heart failure) ([place]) 07/11/2020  . Incarcerated left inguinal hernia 07/11/2020  . SBO (small bowel obstruction) ([place])   . Unilateral inguinal hernia with obstruction and without gangrene   . Volume overload 08/30/2019  .  Anemia due to stage 4 chronic kidney disease ([place]) 08/27/2019  . ESRD (end stage renal disease) ([place])   . Acute on chronic heart failure with preserved ejection fraction (HFpEF) ([place]) 06/05/2019  . Heart failure ([place]) 06/05/2019  . Edema of both lower extremities   . Anemia in chronic kidney disease 01/23/2019  . Benign hypertensive kidney disease with chronic kidney disease 01/23/2019  . Chronic kidney disease (CKD), stage V ([place]) 01/23/2019  . Proteinuria 01/23/2019  . Secondary hyperparathyroidism of renal origin ([place]) 01/23/2019  .  Respiratory failure ([place]) 04/09/2017  . Syncopal episodes 12/15/2015  . Anemia of chronic disease 04/14/2015  . Anxiety 04/14/2015  . Clinical depression 04/14/2015  . Essential hypertension 04/14/2015  . Gastro-esophageal reflux disease without esophagitis 04/14/2015  . Arthritis, degenerative 04/14/2015  . Allergic rhinitis 04/14/2015  . Type 2 diabetes mellitus with hyperlipidemia ([place]) 04/14/2015  . Hyperlipidemia 02/27/2015  . Glaucoma 12/23/2014  . Diabetes ([place]) 12/23/2014  . GERD (gastroesophageal reflux disease) 12/23/2014  . Essential hypertension, malignant 05/24/2013  . SOB (shortness of breath) 05/24/2013  . Acute on chronic diastolic CHF (congestive heart failure) ([place]) 05/24/2013    -Most important course for today is to complete hemodialysis.  Discussed with hemodialysis nurse, will contact Dr. Holley Raring.  -Continue to observe for any additional hemorrhage.  -Repeat H&H in the morning.  -Continue drain.  All of the above findings and recommendations were discussed with the patient, and the medical team, and all of patient's were answered to their expressed satisfaction.  -- Ronny Bacon, M.D., [facility] 07/18/2020

## 2020-07-19 DIAGNOSIS — D500 Iron deficiency anemia secondary to blood loss (chronic): Secondary | ICD-10-CM

## 2020-07-19 LAB — HEMOGLOBIN AND HEMATOCRIT, BLOOD
HCT: 23.3 % — ABNORMAL LOW (ref 36.0–46.0)
Hemoglobin: 7.3 g/dL — ABNORMAL LOW (ref 12.0–15.0)

## 2020-07-19 NOTE — Plan of Care (Signed)

## 2020-07-19 NOTE — Progress Notes (Signed)
[facility] Hospital Day(s): 2  Post op day(s):  . 7  Interval History: Patient seen and examined, no acute events or new complaints overnight. Patient reports left groin pain and swelling about the same.  She underwent hemodialysis last night, and is planned to undergo hemodialysis again tomorrow.  She she reports her hand still having some cramping.  She has had this before.  Review of Systems:  Constitutional: denies fever, chills  Respiratory: denies any shortness of breath  Cardiovascular: denies chest pain or palpitations  Gastrointestinal: denies abdominal pain, N/V, or diarrhea Musculoskeletal: denies pain, decreased motor or sensation Integumentary: denies any other rashes or skin discolorations  Vital signs in last 24 hours: [min-max] current  Temp:  [97.8 F (36.6 C)-99.3 F (37.4 C)] 98.3 F (36.8 C) (03/20 1559) Pulse Rate:  [62-73] 62 (03/20 1559) Resp:  [16-27] 16 (03/20 0759) BP: (108-137)/(39-50) 108/41 (03/20 1559) SpO2:  [90 %-96 %] 96 % (03/20 1559)     Height: '5\' 3"'$  (160 cm) Weight: 55 kg BMI (Calculated): 21.48   Intake/Output last 2 shifts:  03/19 0701 - 03/20 0700 In: 238 [P.O.:238] Out: 1012 [Drains:12]   Physical Exam:  Constitutional: alert, cooperative and no distress  Respiratory: breathing non-labored at rest  Cardiovascular: regular rate and sinus rhythm  Gastrointestinal: soft, non-tender, and non-distended Integumentary: Left groin staples are clean dry and intact.  Drain output continues to be minimal with traces which appear bright red.  No blood noted around the drain, no appreciable reaccumulation of her hematoma, with persistent fullness in her left groin.. The left extremity is warm to touch, to fingertips.  Labs:  CBC Latest Ref Rng & Units 07/19/2020 07/18/2020 07/18/2020  WBC 4.0 - 10.5 K/uL - 7.3 6.9  Hemoglobin 12.0 - 15.0 g/dL 7.3(L) 7.0(L) 7.5(L)  Hematocrit 36.0 - 46.0 % 23.3(L) 22.3(L)  23.9(L)  Platelets 150 - 400 K/uL - 213 206   CMP Latest Ref Rng & Units 07/18/2020 07/18/2020 07/17/2020  Glucose 70 - 99 mg/dL 133(H) 142(H) 103(H)  BUN 8 - 23 mg/dL 44(H) 40(H) 31(H)  Creatinine 0.44 - 1.00 mg/dL 3.98(H) 3.89(H) 3.25(H)  Sodium 135 - 145 mmol/L 134(L) 135 139  Potassium 3.5 - 5.1 mmol/L 4.1 4.4 4.6  Chloride 98 - 111 mmol/L 102 103 103  CO2 22 - 32 mmol/L '26 26 29  '$ Calcium 8.9 - 10.3 mg/dL 8.5(L) 8.7(L) 9.4  Total Protein 6.5 - 8.1 g/dL - - 6.1(L)  Total Bilirubin 0.3 - 1.2 mg/dL - - 0.6  Alkaline Phos 38 - 126 U/L - - 65  AST 15 - 41 U/L - - 17  ALT 0 - 44 U/L - - 14     Imaging studies: No new pertinent imaging studies   Assessment/Plan:  87 y.o. female with drained hematoma   s/p hernia repair, and now with what appears to be acute blood loss anemia without evidence of active hemorrhage. Complicated by pertinent comorbidities including:  Patient Active Problem List   Diagnosis Date Noted  . Hematoma 07/17/2020  . Chronic diastolic CHF (congestive heart failure) ([place]) 07/11/2020  . Incarcerated left inguinal hernia 07/11/2020  . SBO (small bowel obstruction) ([place])   . Unilateral inguinal hernia with obstruction and without gangrene   . Volume overload 08/30/2019  . Anemia due to stage 4 chronic kidney disease ([place]) 08/27/2019  . ESRD (end stage renal disease) ([place])   . Acute on chronic heart failure with preserved ejection fraction (HFpEF) ([place]) 06/05/2019  .  Heart failure ([place]) 06/05/2019  . Edema of both lower extremities   . Anemia in chronic kidney disease 01/23/2019  . Benign hypertensive kidney disease with chronic kidney disease 01/23/2019  . Chronic kidney disease (CKD), stage V ([place]) 01/23/2019  . Proteinuria 01/23/2019  . Secondary hyperparathyroidism of renal origin ([place]) 01/23/2019  . Respiratory failure ([place]) 04/09/2017  . Syncopal episodes 12/15/2015  . Anemia of chronic disease 04/14/2015  . Anxiety 04/14/2015  . Clinical depression  04/14/2015  . Essential hypertension 04/14/2015  . Gastro-esophageal reflux disease without esophagitis 04/14/2015  . Arthritis, degenerative 04/14/2015  . Allergic rhinitis 04/14/2015  . Type 2 diabetes mellitus with hyperlipidemia ([place]) 04/14/2015  . Hyperlipidemia 02/27/2015  . Glaucoma 12/23/2014  . Diabetes ([place]) 12/23/2014  . GERD (gastroesophageal reflux disease) 12/23/2014  . Essential hypertension, malignant 05/24/2013  . SOB (shortness of breath) 05/24/2013  . Acute on chronic diastolic CHF (congestive heart failure) ([place]) 05/24/2013    -Repeat hemodialysis tomorrow as scheduled..    -Continue to observe for any additional hemorrhage.  -Continue drain.  All of the above findings and recommendations were discussed with the patient, and the medical team, and all of patient's were answered to their expressed satisfaction.  -- Ronny Bacon, M.D., [facility] 07/19/2020

## 2020-07-19 NOTE — Progress Notes (Signed)
[facility]  ROUNDING NOTE   Subjective:   Lori Stanley is a 87 y.o. female with past medical history of ESRD on HD, diastolic chronic heart failure, diet controlled type 2 diabetes, hypertension and secondary hyperparathyroidism.  She was recently admitted for incarcerated left-sided inguinal hernia s/p operative reduction on 07/12/20,discharged with drain.She  presented to the ED with LLQ abdominal pain and some bleeding from the surgical drain site. She was admitted for left inguinal hernia and had a repair on 07/12/20  Patient seen resting in bed, in no acute distress. She received dialysis treatment yesterday    Objective:  Vital signs in last 24 hours:  Temp:  [97.8 F (36.6 C)-99.3 F (37.4 C)] 98.2 F (36.8 C) (03/20 1147) Pulse Rate:  [64-73] 64 (03/20 1147) Resp:  [16-27] 16 (03/20 0759) BP: (100-137)/(39-50) 112/42 (03/20 1147) SpO2:  [90 %-94 %] 90 % (03/20 1147)  Weight change:  Filed Weights   07/17/20 0819 07/17/20 1926  Weight: 56 kg 55 kg    Intake/Output: I/O last 3 completed shifts: In: 39 [P.O.:238] Out: 1032 [Drains:32; Other:1000]   Intake/Output this shift:  Total I/O In: 120 [P.O.:120] Out: 0   Physical Exam: General: No acute distress  Head: Normocephalic, atraumatic. Moist oral mucosal membranes  Lungs:  Clear to auscultation bilaterally, normal respiratory effort  Heart: Regular rate and rhythm  Abdomen:  Soft, nontender, non distended  Extremities:  no peripheral edema.  Neurologic: Nonfocal, moving all four extremities  Skin: No lesions or rashes  Access: LUE AVG    Basic Metabolic Panel: Recent Labs  Lab 07/13/20 0635 07/17/20 1000 07/18/20 0532 07/18/20 2027  NA 137 139 135 134*  K 3.7 4.6 4.4 4.1  CL 105 103 103 102  CO2 '25 29 26 26  '$ GLUCOSE 96 103* 142* 133*  BUN 57* 31* 40* 44*  CREATININE 3.85* 3.25* 3.89* 3.98*  CALCIUM 8.7* 9.4 8.7* 8.5*  MG 2.1  --   --   --   PHOS  --   --   --  3.9    Liver  Function Tests: Recent Labs  Lab 07/17/20 1000 07/18/20 2027  AST 17  --   ALT 14  --   ALKPHOS 65  --   BILITOT 0.6  --   PROT 6.1*  --   ALBUMIN 3.2* 2.7*   No results for input(s): LIPASE, AMYLASE in the last 168 hours. No results for input(s): AMMONIA in the last 168 hours.  CBC: Recent Labs  Lab 07/13/20 0635 07/17/20 0846 07/18/20 0532 07/18/20 2027 07/19/20 0545  WBC 9.1 7.5 6.9 7.3  --   NEUTROABS 7.4 6.0  --   --   --   HGB 10.7* 10.4* 7.5* 7.0* 7.3*  HCT 32.7* 33.7* 23.9* 22.3* 23.3*  MCV 88.4 92.3 91.6 91.0  --   PLT 224 262 206 213  --     Cardiac Enzymes: No results for input(s): CKTOTAL, CKMB, CKMBINDEX, TROPONINI in the last 168 hours.  BNP: Invalid input(s): POCBNP  CBG: Recent Labs  Lab 07/13/20 1606 07/13/20 2233 07/14/20 0802 07/14/20 1446 07/14/20 1540  GLUCAP 116* 108* 100* 82 120*    Microbiology: Results for orders placed or performed during the hospital encounter of 07/17/20  Resp Panel by RT-PCR (Flu A&B, Covid) Nasopharyngeal Swab     Status: None   Collection Time: 07/17/20  2:17 PM   Specimen: Nasopharyngeal Swab; Nasopharyngeal(NP) swabs in vial transport medium  Result Value Ref Range Status  SARS Coronavirus 2 by RT PCR NEGATIVE NEGATIVE Final    Comment: (NOTE) SARS-CoV-2 target nucleic acids are NOT DETECTED.  The SARS-CoV-2 RNA is generally detectable in upper respiratory specimens during the acute phase of infection. The lowest concentration of SARS-CoV-2 viral copies this assay can detect is 138 copies/mL. A negative result does not preclude SARS-Cov-2 infection and should not be used as the sole basis for treatment or other patient management decisions. A negative result may occur with  improper specimen collection/handling, submission of specimen other than nasopharyngeal swab, presence of viral mutation(s) within the areas targeted by this assay, and inadequate number of viral copies(<138 copies/mL). A negative  result must be combined with clinical observations, patient history, and epidemiological information. The expected result is Negative.  Fact Sheet for Patients:  EntrepreneurPulse.com.au  Fact Sheet for Healthcare Providers:  IncredibleEmployment.be  This test is no t yet approved or cleared by the [place] FDA and  has been authorized for detection and/or diagnosis of SARS-CoV-2 by FDA under an Emergency Use Authorization (EUA). This EUA will remain  in effect (meaning this test can be used) for the duration of the COVID-19 declaration under Section 564(b)(1) of the Act, 21 U.S.C.section 360bbb-3(b)(1), unless the authorization is terminated  or revoked sooner.       Influenza A by PCR NEGATIVE NEGATIVE Final   Influenza B by PCR NEGATIVE NEGATIVE Final    Comment: (NOTE) The Xpert Xpress SARS-CoV-2/FLU/RSV plus assay is intended as an aid in the diagnosis of influenza from Nasopharyngeal swab specimens and should not be used as a sole basis for treatment. Nasal washings and aspirates are unacceptable for Xpert Xpress SARS-CoV-2/FLU/RSV testing.  Fact Sheet for Patients: EntrepreneurPulse.com.au  Fact Sheet for Healthcare Providers: IncredibleEmployment.be  This test is not yet approved or cleared by the [place] FDA and has been authorized for detection and/or diagnosis of SARS-CoV-2 by FDA under an Emergency Use Authorization (EUA). This EUA will remain in effect (meaning this test can be used) for the duration of the COVID-19 declaration under Section 564(b)(1) of the Act, 21 U.S.C. section 360bbb-3(b)(1), unless the authorization is terminated or revoked.  Performed at [facility], [address] [place], [place] [address]     Coagulation Studies: Recent Labs    07/17/20 1000  LABPROT 14.6  INR 1.2    Urinalysis: No results for input(s): COLORURINE, LABSPEC,  PHURINE, GLUCOSEU, HGBUR, BILIRUBINUR, KETONESUR, PROTEINUR, UROBILINOGEN, NITRITE, LEUKOCYTESUR in the last 72 hours.  Invalid input(s): APPERANCEUR    Imaging: No results found.   Medications:   . sodium chloride    . sodium chloride     . amLODipine  5 mg Oral Daily  . brimonidine  1 drop Both Eyes BID  . carvedilol  12.5 mg Oral BID  . Chlorhexidine Gluconate Cloth  6 each Topical Q0600  . dorzolamide-timolol  1 drop Both Eyes BID  . latanoprost  1 drop Both Eyes QHS  . sertraline  25 mg Oral Daily  . torsemide  100 mg Oral Daily  . traZODone  150 mg Oral QHS   sodium chloride, sodium chloride, acetaminophen, albuterol, alteplase, heparin, HYDROmorphone (DILAUDID) injection, lidocaine (PF), lidocaine-prilocaine, ondansetron **OR** ondansetron (ZOFRAN) IV, oxyCODONE, pentafluoroprop-tetrafluoroeth  Assessment/ Plan:  Ms. NGUN SCHEIDEMAN is a 87 y.o.  female with past medical history of ESRD on HD, diastolic chronic heart failure, diet controlled type 2 diabetes, hypertension and secondary hyperparathyroidism.  Past Medical History:  Diagnosis Date  . Acute heart failure (  HCC)   . Anemia   . Anxiety   . CHF (congestive heart failure) ([place])   . Chronic kidney disease    stage V; end stage renal disease  . Chronic low back pain   . Depression   . Diabetes mellitus without complication ([place])   . GERD (gastroesophageal reflux disease)   . Hyperlipidemia   . Hypertension   . MI (myocardial infarction) ([place])   . Obesity     [place]/MF/LUE AVG  # End Stage Renal Disease on hemodialysis:MF Received HD yesterday Will plan dialysis tomorrow to keep her outpatient schedule  # Anemia of chronic kidney disease Hemoglobin 10.7.  Lab Results  Component Value Date   HGB 7.3 (L) 07/19/2020  Will continue to monitor CBCs  # Secondary Hyperparathyroidism:  Lab Results  Component Value Date   PTH 148 (H) 06/07/2019   CALCIUM 8.5 (L) 07/18/2020   CAION 1.35  11/13/2019   PHOS 3.9 07/18/2020   Will continue monitoring bone mineral metabolism parameters   LOS: 1 Arra Connaughton 3/20/20222:55 PM

## 2020-07-20 ENCOUNTER — Telehealth: Payer: Self-pay

## 2020-07-20 ENCOUNTER — Encounter: Payer: Medicare Other | Admitting: Surgery

## 2020-07-20 LAB — PHOSPHORUS: Phosphorus: 1.7 mg/dL — ABNORMAL LOW (ref 2.5–4.6)

## 2020-07-20 NOTE — TOC Initial Note (Addendum)
Transition of Care [facility]) - Initial/Assessment Note    Patient Details  Name: Lori Stanley MRN: [id number] Date of Birth: 11-18-1931  [facility] CM/SW Contact:    Beverly Sessions, RN Phone Number: 07/20/2020, 3:46 PM  Clinical Narrative:                 Patient admitted from home with bleeding at her drain site Patient lives at home al one [place] and sister live locally and provide transportation to appointments and HD  Sister to transport at discharge Helene Kelp with [facility] at home notified of discharge.   Elvera Bicker dialysis liaison notified of discharge  Patient received a RW at previous DC       Patient Goals and CMS Choice        Expected Discharge Plan and Services           Expected Discharge Date: 07/20/20                                    Prior Living Arrangements/Services                       Activities of Daily Living Home Assistive Devices/Equipment: Gilford Rile (specify type) ADL Screening (condition at time of admission) Patient's cognitive ability adequate to safely complete daily activities?: Yes Is the patient deaf or have difficulty hearing?: No Does the patient have difficulty seeing, even when wearing glasses/contacts?: No Does the patient have difficulty concentrating, remembering, or making decisions?: No Patient able to express need for assistance with ADLs?: Yes Does the patient have difficulty dressing or bathing?: No Independently performs ADLs?: Yes (appropriate for developmental age) Does the patient have difficulty walking or climbing stairs?: Yes Weakness of Legs: None Weakness of Arms/Hands: None  Permission Sought/Granted                  Emotional Assessment              Admission diagnosis:  Hematoma [T14.8XXA] ESRD (end stage renal disease) ([place]) [N18.6] Abdominal wall hematoma, subsequent encounter [S30.1XXD] Patient Active Problem List   Diagnosis Date Noted  . Hematoma  07/17/2020  . Chronic diastolic CHF (congestive heart failure) ([place]) 07/11/2020  . Incarcerated left inguinal hernia 07/11/2020  . SBO (small bowel obstruction) ([place])   . Unilateral inguinal hernia with obstruction and without gangrene   . Volume overload 08/30/2019  . Anemia due to stage 4 chronic kidney disease ([place]) 08/27/2019  . ESRD (end stage renal disease) ([place])   . Acute on chronic heart failure with preserved ejection fraction (HFpEF) ([place]) 06/05/2019  . Heart failure ([place]) 06/05/2019  . Edema of both lower extremities   . Anemia in chronic kidney disease 01/23/2019  . Benign hypertensive kidney disease with chronic kidney disease 01/23/2019  . Chronic kidney disease (CKD), stage V ([place]) 01/23/2019  . Proteinuria 01/23/2019  . Secondary hyperparathyroidism of renal origin ([place]) 01/23/2019  . Respiratory failure ([place]) 04/09/2017  . Syncopal episodes 12/15/2015  . Anemia of chronic disease 04/14/2015  . Anxiety 04/14/2015  . Clinical depression 04/14/2015  . Essential hypertension 04/14/2015  . Gastro-esophageal reflux disease without esophagitis 04/14/2015  . Arthritis, degenerative 04/14/2015  . Allergic rhinitis 04/14/2015  . Type 2 diabetes mellitus with hyperlipidemia ([place]) 04/14/2015  . Hyperlipidemia 02/27/2015  . Glaucoma 12/23/2014  . Diabetes ([place]) 12/23/2014  . GERD (gastroesophageal reflux disease) 12/23/2014  .  Essential hypertension, malignant 05/24/2013  . SOB (shortness of breath) 05/24/2013  . Acute on chronic diastolic CHF (congestive heart failure) ([place]) 05/24/2013   PCP:  Jerrol Banana., MD Pharmacy:   [id number] [place], [id number]: [phone]: [phone]    Social Determinants of Health (SDOH) Interventions    Readmission Risk Interventions Readmission Risk Prevention Plan 07/20/2020 09/03/2019  Transportation Screening Complete Complete  PCP or Specialist Appt  within 3-5 Days Complete -  HRI or Home Care Consult Complete -  Palliative Care Screening Not Applicable Not Applicable  Medication Review (RN Care Manager) Complete Complete  Some recent data might be hidden

## 2020-07-20 NOTE — Progress Notes (Signed)
[facility]  ROUNDING NOTE   Subjective:   Lori Stanley is a 87 y.o. female with past medical history of ESRD on HD, diastolic chronic heart failure, diet controlled type 2 diabetes, hypertension and secondary hyperparathyroidism.  She was recently admitted for incarcerated left-sided inguinal hernia s/p operative reduction on 07/12/20,discharged with drain.She  presented to the ED with LLQ abdominal pain and some bleeding from the surgical drain site. She was admitted for left inguinal hernia and had a repair on 07/12/20  Patient seen during dialysis   HEMODIALYSIS FLOWSHEET:  Blood Flow Rate (mL/min): 400 mL/min Arterial Pressure (mmHg): -160 mmHg Venous Pressure (mmHg): 150 mmHg Transmembrane Pressure (mmHg): 70 mmHg Ultrafiltration Rate (mL/min): 0 mL/min Dialysate Flow Rate (mL/min): 500 ml/min Conductivity: Machine : 14 Conductivity: Machine : 14 Dialysis Fluid Bolus: Normal Saline Bolus Amount (mL): 200 mL  Patient resting in bed No complaints at this time Denies shortness of breath Able to eat breakfast with no nausea   Objective:  Vital signs in last 24 hours:  Temp:  [97.7 F (36.5 C)-98.3 F (36.8 C)] 97.7 F (36.5 C) (03/21 1456) Pulse Rate:  [48-63] 60 (03/21 1456) Resp:  [11-27] 18 (03/21 1456) BP: (107-142)/(37-50) 142/46 (03/21 1456) SpO2:  [82 %-100 %] 96 % (03/21 1456)  Weight change:  Filed Weights   07/17/20 0819 07/17/20 1926  Weight: 56 kg 55 kg    Intake/Output: I/O last 3 completed shifts: In: 47 [P.O.:238] Out: 1010 [Drains:10; Other:1000]   Intake/Output this shift:  Total I/O In: -  Out: 504 [Other:504]  Physical Exam: General: No acute distress  Head: Normocephalic, atraumatic. Moist oral mucosal membranes  Lungs:  Clear to auscultation bilaterally, normal respiratory effort  Heart: Regular rate and rhythm  Abdomen:  Soft, nontender, non distended  Extremities:  no peripheral edema.  Neurologic: Nonfocal,  moving all four extremities  Skin: No lesions or rashes  Access: LUE AVG    Basic Metabolic Panel: Recent Labs  Lab 07/17/20 1000 07/18/20 0532 07/18/20 2027 07/20/20 0832  NA 139 135 134*  --   K 4.6 4.4 4.1  --   CL 103 103 102  --   CO2 '29 26 26  '$ --   GLUCOSE 103* 142* 133*  --   BUN 31* 40* 44*  --   CREATININE 3.25* 3.89* 3.98*  --   CALCIUM 9.4 8.7* 8.5*  --   PHOS  --   --  3.9 1.7*    Liver Function Tests: Recent Labs  Lab 07/17/20 1000 07/18/20 2027  AST 17  --   ALT 14  --   ALKPHOS 65  --   BILITOT 0.6  --   PROT 6.1*  --   ALBUMIN 3.2* 2.7*   No results for input(s): LIPASE, AMYLASE in the last 168 hours. No results for input(s): AMMONIA in the last 168 hours.  CBC: Recent Labs  Lab 07/17/20 0846 07/18/20 0532 07/18/20 2027 07/19/20 0545  WBC 7.5 6.9 7.3  --   NEUTROABS 6.0  --   --   --   HGB 10.4* 7.5* 7.0* 7.3*  HCT 33.7* 23.9* 22.3* 23.3*  MCV 92.3 91.6 91.0  --   PLT 262 206 213  --     Cardiac Enzymes: No results for input(s): CKTOTAL, CKMB, CKMBINDEX, TROPONINI in the last 168 hours.  BNP: Invalid input(s): POCBNP  CBG: Recent Labs  Lab 07/13/20 1606 07/13/20 2233 07/14/20 0802 07/14/20 1446 07/14/20 1540  GLUCAP 116* 108* 100* 82  120*    Microbiology: Results for orders placed or performed during the hospital encounter of 07/17/20  Resp Panel by RT-PCR (Flu A&B, Covid) Nasopharyngeal Swab     Status: None   Collection Time: 07/17/20  2:17 PM   Specimen: Nasopharyngeal Swab; Nasopharyngeal(NP) swabs in vial transport medium  Result Value Ref Range Status   SARS Coronavirus 2 by RT PCR NEGATIVE NEGATIVE Final    Comment: (NOTE) SARS-CoV-2 target nucleic acids are NOT DETECTED.  The SARS-CoV-2 RNA is generally detectable in upper respiratory specimens during the acute phase of infection. The lowest concentration of SARS-CoV-2 viral copies this assay can detect is 138 copies/mL. A negative result does not preclude  SARS-Cov-2 infection and should not be used as the sole basis for treatment or other patient management decisions. A negative result may occur with  improper specimen collection/handling, submission of specimen other than nasopharyngeal swab, presence of viral mutation(s) within the areas targeted by this assay, and inadequate number of viral copies(<138 copies/mL). A negative result must be combined with clinical observations, patient history, and epidemiological information. The expected result is Negative.  Fact Sheet for Patients:  EntrepreneurPulse.com.au  Fact Sheet for Healthcare Providers:  IncredibleEmployment.be  This test is no t yet approved or cleared by the [place] FDA and  has been authorized for detection and/or diagnosis of SARS-CoV-2 by FDA under an Emergency Use Authorization (EUA). This EUA will remain  in effect (meaning this test can be used) for the duration of the COVID-19 declaration under Section 564(b)(1) of the Act, 21 U.S.C.section 360bbb-3(b)(1), unless the authorization is terminated  or revoked sooner.       Influenza A by PCR NEGATIVE NEGATIVE Final   Influenza B by PCR NEGATIVE NEGATIVE Final    Comment: (NOTE) The Xpert Xpress SARS-CoV-2/FLU/RSV plus assay is intended as an aid in the diagnosis of influenza from Nasopharyngeal swab specimens and should not be used as a sole basis for treatment. Nasal washings and aspirates are unacceptable for Xpert Xpress SARS-CoV-2/FLU/RSV testing.  Fact Sheet for Patients: EntrepreneurPulse.com.au  Fact Sheet for Healthcare Providers: IncredibleEmployment.be  This test is not yet approved or cleared by the [place] FDA and has been authorized for detection and/or diagnosis of SARS-CoV-2 by FDA under an Emergency Use Authorization (EUA). This EUA will remain in effect (meaning this test can be used) for the duration of  the COVID-19 declaration under Section 564(b)(1) of the Act, 21 U.S.C. section 360bbb-3(b)(1), unless the authorization is terminated or revoked.  Performed at [facility], [address] [place], [place] [address]     Coagulation Studies: No results for input(s): LABPROT, INR in the last 72 hours.  Urinalysis: No results for input(s): COLORURINE, LABSPEC, PHURINE, GLUCOSEU, HGBUR, BILIRUBINUR, KETONESUR, PROTEINUR, UROBILINOGEN, NITRITE, LEUKOCYTESUR in the last 72 hours.  Invalid input(s): APPERANCEUR    Imaging: No results found.   Medications:    . amLODipine  5 mg Oral Daily  . brimonidine  1 drop Both Eyes BID  . carvedilol  12.5 mg Oral BID  . Chlorhexidine Gluconate Cloth  6 each Topical Q0600  . dorzolamide-timolol  1 drop Both Eyes BID  . latanoprost  1 drop Both Eyes QHS  . sertraline  25 mg Oral Daily  . torsemide  100 mg Oral Daily  . traZODone  150 mg Oral QHS   acetaminophen, albuterol, HYDROmorphone (DILAUDID) injection, ondansetron **OR** ondansetron (ZOFRAN) IV, oxyCODONE  Assessment/ Plan:  Lori Stanley is a 87 y.o.  female with past medical history of ESRD on HD, diastolic chronic heart failure, diet controlled type 2 diabetes, hypertension and secondary hyperparathyroidism.  Past Medical History:  Diagnosis Date  . Acute heart failure ([place])   . Anemia   . Anxiety   . CHF (congestive heart failure) ([place])   . Chronic kidney disease    stage V; end stage renal disease  . Chronic low back pain   . Depression   . Diabetes mellitus without complication ([place])   . GERD (gastroesophageal reflux disease)   . Hyperlipidemia   . Hypertension   . MI (myocardial infarction) ([place])   . Obesity     [place]/MF/LUE AVG  # End Stage Renal Disease on hemodialysis:MF Received today [id number] Next treatment on Friday  # Anemia of chronic kidney disease Lab Results  Component Value Date   HGB 7.3 (L) 07/19/2020  Will continue  to monitor CBCs May consider EPO outpatient  # Secondary Hyperparathyroidism:  Lab Results  Component Value Date   PTH 148 (H) 06/07/2019   CALCIUM 8.5 (L) 07/18/2020   CAION 1.35 11/13/2019   PHOS 1.7 (L) 07/20/2020    Ordering Sodium Phos 30 mmol IV x 1   LOS: 2 Shantelle Breeze 3/21/20223:26 PM

## 2020-07-20 NOTE — Progress Notes (Signed)
Patient sent out via wheelchair with belongings to waiting ride 

## 2020-07-20 NOTE — Discharge Summary (Signed)
[facility] SURGICAL DISCHARGE SUMMARY   Patient ID: Lori Stanley MRN: [id number] DOB/AGE: 85-Jan-1933 85 y.o.  Admit date: 07/17/2020 Discharge date: 07/20/2020  Discharge Diagnoses Patient Active Problem List   Diagnosis Date Noted  . Hematoma 07/17/2020  . Incarcerated left inguinal hernia 07/11/2020  . SBO (small bowel obstruction) ([place])   . Unilateral inguinal hernia with obstruction and without gangrene     Consultants Nephrology   Procedures None  HPI: 87 y.o. female well known to our service following emergent left incarcerated femoral hernia repair on 03/13 with Dr Hampton Abbot. She did well and was discharged home on 03/15. She reports that she was doing at home until earlier this morning. She reports the acute onset of bleeding around her drain site earlier this morning. This was accompanied with LLQ abdominal soreness as well. Her drain had been functioning normal until this point. She denied any fever, chills, nausea, emesis. She is tolerating PO and having normal bowel function. Otherwise, had been doing well. She is on 81 mg ASA but no other blood thinning medications. Work up in the ED revealed Hgb of 10.4 (10.7 at discharge), normal WBC at 7.5, renal function is baseline with her history of ESRD 3.25. She did have CT Abdomen/Pelvis which is concerning for large hematoma in her operative site; however, the drain is located within this.   Hospital Course: Patient was admitted to general surgery for observation. Her Hgb was monitored and stable x48 hours. Nephrology was consulted for HD given her ESRD. The remainder of patient's hospital course was essentially unremarkable, and discharge planning was initiated accordingly with patient safely able to be discharged home with appropriate discharge instructions, pain control, and outpatient follow-up after all of her questions were answered to her expressed satisfaction.   Discharge Condition: Good   Physical  Examination:  Constitutional: Alert, cooperative and no distress  Respiratory: breathing non-labored at rest  Cardiovascular: regular rate and sinus rhythm  Gastrointestinal: Soft, non-tender, and non-distended Integumentary: Left inguinal incision is CDI with staples, this remains indurated expectedly but no erythema or drainage, her surgical drain continues to have serosanguinous drainage with some old blood, no bright red blood   Allergies as of 07/20/2020      Reactions   Antihistamines, Chlorpheniramine-type Other (See Comments)   Unknown reaction.   Paxil [paroxetine]    "makes her feel funny" not in a good way   Codeine Rash, Other (See Comments)   Mouth sore      Medication List    TAKE these medications   acetaminophen 500 MG tablet Commonly known as: TYLENOL Take 500-1,000 mg by mouth every 6 (six) hours as needed for mild pain or fever.   albuterol 108 (90 Base) MCG/ACT inhaler Commonly known as: VENTOLIN HFA TAKE 2 PUFFS BY MOUTH EVERY 6 HOURS AS NEEDED FOR WHEEZE OR SHORTNESS OF BREATH What changed: See the new instructions.   amLODipine 5 MG tablet Commonly known as: NORVASC Take 1 tablet (5 mg total) by mouth daily.   Bepotastine Besilate 1.5 % Soln Place 1 drop into both eyes in the morning and at bedtime. Morning & afternoon.   brimonidine 0.2 % ophthalmic solution Commonly known as: ALPHAGAN Place 1 drop into both eyes 2 (two) times daily.   calcitRIOL 0.25 MCG capsule Commonly known as: ROCALTROL Take 0.25 mcg by mouth every Monday, Wednesday, and Friday.   carvedilol 12.5 MG tablet Commonly known as: COREG TAKE 1 TABLET (12.5 MG TOTAL) BY MOUTH 2 (TWO) TIMES  DAILY.   [facility] Aspirin Adult Low Dose 81 MG chewable tablet Generic drug: aspirin CHEW 1 TABLET (81 MG TOTAL) BY MOUTH DAILY.   dorzolamide-timolol 22.3-6.8 MG/ML ophthalmic solution Commonly known as: COSOPT Place 1 drop into both eyes 2 (two) times daily.   latanoprost 0.005 % ophthalmic  solution Commonly known as: XALATAN Place 1 drop into both eyes at bedtime.   multivitamin Tabs tablet Take 1 tablet by mouth daily.   OXYGEN Inhale 2 L into the lungs at bedtime.   sertraline 25 MG tablet Commonly known as: ZOLOFT TAKE 1 TABLET BY MOUTH EVERY DAY   torsemide 100 MG tablet Commonly known as: DEMADEX Take 100 mg by mouth daily.   traZODone 150 MG tablet Commonly known as: DESYREL TAKE 1 TABLET (150 MG TOTAL) BY MOUTH AT BEDTIME. What changed: See the new instructions.         Follow-up Information    Olean Ree, MD. Go on 07/22/2020.   Specialty: General Surgery Why: Go to your scheduled follow up on 03/23 at 1015 with dr Wynona Canes information: [address] [place] [address] [phone]                Time spent on discharge management including discussion of hospital course, clinical condition, outpatient instructions, prescriptions, and follow up with the patient and members of the medical team: >30 minutes  -- Edison Simon , PA-C [facility]  07/20/2020, 10:15 AM [phone] M-F: 7am - 4pm

## 2020-07-20 NOTE — Progress Notes (Signed)
Patient transported to HD 

## 2020-07-22 ENCOUNTER — Encounter: Payer: Self-pay | Admitting: Surgery

## 2020-07-22 ENCOUNTER — Inpatient Hospital Stay: Payer: Medicare Other | Admitting: Family Medicine

## 2020-07-22 ENCOUNTER — Emergency Department
Admission: EM | Admit: 2020-07-22 | Discharge: 2020-07-22 | Disposition: A | Discharge status: Home / Self Care | Payer: Medicare Other | Attending: Emergency Medicine | Admitting: Emergency Medicine

## 2020-07-22 ENCOUNTER — Other Ambulatory Visit: Payer: Self-pay

## 2020-07-22 ENCOUNTER — Ambulatory Visit (INDEPENDENT_AMBULATORY_CARE_PROVIDER_SITE_OTHER): Payer: Medicare Other | Admitting: Surgery

## 2020-07-22 VITALS — BP 143/51 | HR 66 | Temp 97.8°F | Ht 63.0 in | Wt 120.2 lb

## 2020-07-22 DIAGNOSIS — E1122 Type 2 diabetes mellitus with diabetic chronic kidney disease: Secondary | ICD-10-CM | POA: Diagnosis not present

## 2020-07-22 DIAGNOSIS — R1032 Left lower quadrant pain: Secondary | ICD-10-CM | POA: Diagnosis not present

## 2020-07-22 DIAGNOSIS — Z87891 Personal history of nicotine dependence: Secondary | ICD-10-CM | POA: Insufficient documentation

## 2020-07-22 DIAGNOSIS — T148XXA Other injury of unspecified body region, initial encounter: Secondary | ICD-10-CM

## 2020-07-22 DIAGNOSIS — Z992 Dependence on renal dialysis: Secondary | ICD-10-CM | POA: Insufficient documentation

## 2020-07-22 DIAGNOSIS — I5033 Acute on chronic diastolic (congestive) heart failure: Secondary | ICD-10-CM | POA: Insufficient documentation

## 2020-07-22 DIAGNOSIS — I132 Hypertensive heart and chronic kidney disease with heart failure and with stage 5 chronic kidney disease, or end stage renal disease: Secondary | ICD-10-CM | POA: Diagnosis not present

## 2020-07-22 DIAGNOSIS — G8918 Other acute postprocedural pain: Secondary | ICD-10-CM | POA: Insufficient documentation

## 2020-07-22 DIAGNOSIS — R9431 Abnormal electrocardiogram [ECG] [EKG]: Secondary | ICD-10-CM | POA: Diagnosis not present

## 2020-07-22 DIAGNOSIS — K4030 Unilateral inguinal hernia, with obstruction, without gangrene, not specified as recurrent: Secondary | ICD-10-CM

## 2020-07-22 DIAGNOSIS — Z09 Encounter for follow-up examination after completed treatment for conditions other than malignant neoplasm: Secondary | ICD-10-CM

## 2020-07-22 DIAGNOSIS — Z79899 Other long term (current) drug therapy: Secondary | ICD-10-CM | POA: Diagnosis not present

## 2020-07-22 DIAGNOSIS — Z7982 Long term (current) use of aspirin: Secondary | ICD-10-CM | POA: Diagnosis not present

## 2020-07-22 DIAGNOSIS — N186 End stage renal disease: Secondary | ICD-10-CM | POA: Diagnosis not present

## 2020-07-22 LAB — CBC
HCT: 25.4 % — ABNORMAL LOW (ref 36.0–46.0)
Hemoglobin: 7.8 g/dL — ABNORMAL LOW (ref 12.0–15.0)
MCH: 28.6 pg (ref 26.0–34.0)
MCHC: 30.7 g/dL (ref 30.0–36.0)
MCV: 93 fL (ref 80.0–100.0)
Platelets: 259 10*3/uL (ref 150–400)
RBC: 2.73 MIL/uL — ABNORMAL LOW (ref 3.87–5.11)
RDW: 14 % (ref 11.5–15.5)
WBC: 8.2 10*3/uL (ref 4.0–10.5)
nRBC: 0 % (ref 0.0–0.2)

## 2020-07-22 LAB — COMPREHENSIVE METABOLIC PANEL
ALT: 12 U/L (ref 0–44)
AST: 22 U/L (ref 15–41)
Albumin: 3.1 g/dL — ABNORMAL LOW (ref 3.5–5.0)
Alkaline Phosphatase: 75 U/L (ref 38–126)
Anion gap: 10 (ref 5–15)
BUN: 28 mg/dL — ABNORMAL HIGH (ref 8–23)
CO2: 28 mmol/L (ref 22–32)
Calcium: 9.5 mg/dL (ref 8.9–10.3)
Chloride: 99 mmol/L (ref 98–111)
Creatinine, Ser: 3.22 mg/dL — ABNORMAL HIGH (ref 0.44–1.00)
GFR, Estimated: 13 mL/min — ABNORMAL LOW (ref >60)
Glucose, Bld: 187 mg/dL — ABNORMAL HIGH (ref 70–99)
Potassium: 4.4 mmol/L (ref 3.5–5.1)
Sodium: 137 mmol/L (ref 135–145)
Total Bilirubin: 1 mg/dL (ref 0.3–1.2)
Total Protein: 5.9 g/dL — ABNORMAL LOW (ref 6.5–8.1)

## 2020-07-22 LAB — TYPE AND SCREEN

## 2020-07-22 NOTE — ED Notes (Addendum)
IV attempt x1, bloodwork obtained

## 2020-07-22 NOTE — Progress Notes (Signed)
07/22/2020  HPI: Lori Stanley is a 87 y.o. female s/p left femoral incarcerated hernia repair on 07/11/20.  She was readmitted on 3/18 with hematoma of the left groin.  She has a blake drain in place and staples.  Presents for follow up.  Denies any worsening pain.  The drain is working, but she was unable to empty it this morning.    Vital signs: BP (!) 143/51   Pulse 66   Temp 97.8 F (36.6 C) (Oral)   Ht '5\' 3"'$  (1.6 m)   Wt 120 lb 3.2 oz (54.5 kg)   SpO2 91%   BMI 21.29 kg/m    Physical Exam: Constitutional:  No acute distress Abdomen:  Soft, non-distended, with some discomfort in the left groin.  There is a moderate size hematoma consistent with the previous findings.  Stable in size.  Staples in place and incision is healing well.  Blake drain in place with old blood and clot in drain.  No bright red blood.  Assessment/Plan: This is a 87 y.o. female s/p left femoral incarcerated hernia repair, complicated by hematoma.  --The patient's hematoma is stable.  There is only only blood in the drain.  Drain bulb replaced as the clot was creating a blockage with emptying it.  Staples removed and steri strips placed. --Will leave the drain in for another week.  It is helping with drainage for now. --Follow up in one week for drain removal.   Melvyn Neth, [place]

## 2020-07-22 NOTE — ED Provider Notes (Signed)
[facility] Emergency Department Provider Note   ____________________________________________   Event Date/Time   First MD Initiated Contact with Patient 07/22/20 1208     (approximate)  I have reviewed the triage vital signs and the nursing notes.   HISTORY  Chief Complaint Abdominal Pain    HPI Lori Stanley is a 87 y.o. female with a stated past medical history of heart failure, end-stage renal disease, type 2 diabetes, and hypertension as well as a recent inguinal hernia repair with a JP drain in place.  Patient states that she was seen by her general surgeon earlier today and given a "clean bill" for her surgical incision site however when she left the office she began having cramping, 7/10, nonradiating abdominal pain that does not radiate and had no exacerbating or relieving factors.  Patient denies any continued pain at the time of this interview.  Patient currently denies any vision changes, tinnitus, difficulty speaking, facial droop, sore throat, chest pain, shortness of breath, nausea/vomiting/diarrhea, dysuria, or weakness/numbness/paresthesias in any extremity         Past Medical History:  Diagnosis Date  . Acute heart failure ([place])   . Anemia   . Anxiety   . CHF (congestive heart failure) ([place])   . Chronic kidney disease    stage V; end stage renal disease  . Chronic low back pain   . Depression   . Diabetes mellitus without complication ([place])   . GERD (gastroesophageal reflux disease)   . Hyperlipidemia   . Hypertension   . MI (myocardial infarction) ([place])   . Obesity     Patient Active Problem List   Diagnosis Date Noted  . Hematoma 07/17/2020  . Chronic diastolic CHF (congestive heart failure) ([place]) 07/11/2020  . Incarcerated left inguinal hernia 07/11/2020  . SBO (small bowel obstruction) ([place])   . Unilateral inguinal hernia with obstruction and without gangrene   . Volume overload 08/30/2019  . Anemia due to stage  4 chronic kidney disease ([place]) 08/27/2019  . ESRD (end stage renal disease) ([place])   . Acute on chronic heart failure with preserved ejection fraction (HFpEF) ([place]) 06/05/2019  . Heart failure ([place]) 06/05/2019  . Edema of both lower extremities   . Anemia in chronic kidney disease 01/23/2019  . Benign hypertensive kidney disease with chronic kidney disease 01/23/2019  . Chronic kidney disease (CKD), stage V ([place]) 01/23/2019  . Proteinuria 01/23/2019  . Secondary hyperparathyroidism of renal origin ([place]) 01/23/2019  . Respiratory failure ([place]) 04/09/2017  . Syncopal episodes 12/15/2015  . Anemia of chronic disease 04/14/2015  . Anxiety 04/14/2015  . Clinical depression 04/14/2015  . Essential hypertension 04/14/2015  . Gastro-esophageal reflux disease without esophagitis 04/14/2015  . Arthritis, degenerative 04/14/2015  . Allergic rhinitis 04/14/2015  . Type 2 diabetes mellitus with hyperlipidemia ([place]) 04/14/2015  . Hyperlipidemia 02/27/2015  . Glaucoma 12/23/2014  . Diabetes ([place]) 12/23/2014  . GERD (gastroesophageal reflux disease) 12/23/2014  . Essential hypertension, malignant 05/24/2013  . SOB (shortness of breath) 05/24/2013  . Acute on chronic diastolic CHF (congestive heart failure) ([place]) 05/24/2013    Past Surgical History:  Procedure Laterality Date  . A/V FISTULAGRAM Left 01/15/2020   Procedure: A/V FISTULAGRAM;  Surgeon: Katha Cabal, MD;  Location: [place];  Service: Cardiovascular;  Laterality: Left;  . ABDOMINAL HYSTERECTOMY    . APPENDECTOMY    . AV FISTULA PLACEMENT Left 11/13/2019   Procedure: INSERTION OF ARTERIOVENOUS (AV) GORE-TEX GRAFT ARM ( BRACHIAL AXILLARY );  Surgeon: Katha Cabal, MD;  Location: [facility];  Service: Vascular;  Laterality: Left;  . DIALYSIS/PERMA CATHETER INSERTION N/A 06/07/2019   Procedure: DIALYSIS/PERMA CATHETER INSERTION;  Surgeon: Katha Cabal, MD;  Location: [place];  Service: Cardiovascular;   Laterality: N/A;  . DIALYSIS/PERMA CATHETER REMOVAL N/A 02/11/2020   Procedure: DIALYSIS/PERMA CATHETER REMOVAL;  Surgeon: Katha Cabal, MD;  Location: [place] [facility];  Service: Cardiovascular;  Laterality: N/A;  . EYE SURGERY    . HERNIA REPAIR    . INCISION AND DRAINAGE PERIRECTAL ABSCESS    . INGUINAL HERNIA REPAIR N/A 07/12/2020   Procedure: HERNIA REPAIR INGUINAL ADULT;  Surgeon: Olean Ree, MD;  Location: [facility];  Service: General;  Laterality: N/A;  . KNEE SURGERY    . THYROID SURGERY    . VAGINAL HYSTERECTOMY      Prior to Admission medications   Medication Sig Start Date End Date Taking? Authorizing Provider  acetaminophen (TYLENOL) 500 MG tablet Take 500-1,000 mg by mouth every 6 (six) hours as needed for mild pain or fever.     [provider]  albuterol (VENTOLIN HFA) 108 (90 Base) MCG/ACT inhaler TAKE 2 PUFFS BY MOUTH EVERY 6 HOURS AS NEEDED FOR WHEEZE OR SHORTNESS OF BREATH Patient taking differently: Inhale 2 puffs into the lungs every 6 (six) hours as needed for wheezing or shortness of breath. 10/05/19   Jerrol Banana., MD  amLODipine (NORVASC) 5 MG tablet Take 1 tablet (5 mg total) by mouth daily. 05/20/20   Jerrol Banana., MD  Bepotastine Besilate 1.5 % SOLN Place 1 drop into both eyes in the morning and at bedtime. Morning & afternoon.    [provider]  brimonidine (ALPHAGAN) 0.2 % ophthalmic solution Place 1 drop into both eyes 2 (two) times daily.     [provider]  calcitRIOL (ROCALTROL) 0.25 MCG capsule Take 0.25 mcg by mouth every Monday, Wednesday, and Friday.  10/23/18   [provider]  carvedilol (COREG) 12.5 MG tablet TAKE 1 TABLET (12.5 MG TOTAL) BY MOUTH 2 (TWO) TIMES DAILY. 04/26/20   Jerrol Banana., MD  CVS ASPIRIN ADULT LOW DOSE 81 MG chewable tablet CHEW 1 TABLET (81 MG TOTAL) BY MOUTH DAILY. 05/10/20   Jerrol Banana., MD  dorzolamide-timolol (COSOPT) 22.3-6.8 MG/ML  ophthalmic solution Place 1 drop into both eyes 2 (two) times daily.  03/06/19   [provider]  latanoprost (XALATAN) 0.005 % ophthalmic solution Place 1 drop into both eyes at bedtime. 02/11/15   [provider]  multivitamin (RENA-VIT) TABS tablet Take 1 tablet by mouth daily.  09/13/19   [provider]  OXYGEN Inhale 2 L into the lungs at bedtime.    [provider]  sertraline (ZOLOFT) 25 MG tablet TAKE 1 TABLET BY MOUTH EVERY DAY Patient taking differently: Take 25 mg by mouth daily. 01/17/20   Jerrol Banana., MD  torsemide (DEMADEX) 100 MG tablet Take 100 mg by mouth daily.    [provider]  traZODone (DESYREL) 150 MG tablet TAKE 1 TABLET (150 MG TOTAL) BY MOUTH AT BEDTIME. Patient taking differently: Take 150 mg by mouth at bedtime. 05/07/20   Jerrol Banana., MD    Allergies Antihistamines, chlorpheniramine-type; Paxil [paroxetine]; and Codeine  Family History  Problem Relation Age of Onset  . Heart attack Mother   . Heart disease Sister   . Cancer Sister     Social History  Social History   Tobacco Use  . Smoking status: Former Smoker    Types: Cigarettes  . Smokeless tobacco: Never Used  . Tobacco comment: Quit in 2015-2016  Vaping Use  . Vaping Use: Never used  Substance Use Topics  . Alcohol use: No  . Drug use: No    Review of Systems Constitutional: No fever/chills Eyes: No visual changes. ENT: No sore throat. Cardiovascular: Denies chest pain. Respiratory: Denies shortness of breath. Gastrointestinal: Endorses abdominal pain.  No nausea, no vomiting.  No diarrhea. Genitourinary: Negative for dysuria. Musculoskeletal: Negative for acute arthralgias Skin: Negative for rash. Neurological: Negative for headaches, weakness/numbness/paresthesias in any extremity Psychiatric: Negative for suicidal ideation/homicidal ideation   ____________________________________________   PHYSICAL EXAM:  VITAL  SIGNS: ED Triage Vitals  Enc Vitals Group     BP 07/22/20 1205 (!) 90/50     Pulse Rate 07/22/20 1205 (!) 45     Resp 07/22/20 1205 18     Temp 07/22/20 1205 97.6 F (36.4 C)     Temp Source 07/22/20 1205 Oral     SpO2 07/22/20 1205 93 %     Weight 07/22/20 1203 119 lb 0.8 oz (54 kg)     Height 07/22/20 1203 '5\' 3"'$  (1.6 m)     Head Circumference --      Peak Flow --      Pain Score 07/22/20 1202 7     Pain Loc --      Pain Edu? --      Excl. in [place]? --    Constitutional: Alert and oriented. Well appearing and in no acute distress. Eyes: Conjunctivae are normal. PERRL. Head: Atraumatic. Nose: No congestion/rhinnorhea. Mouth/Throat: Mucous membranes are moist. Neck: No stridor Cardiovascular: Grossly normal heart sounds.  Good peripheral circulation. Respiratory: Normal respiratory effort.  No retractions. Gastrointestinal: Soft and nontender. No distention.  Surgical incision site to the left inguinal region with JP drain in place draining blood Musculoskeletal: No obvious deformities Neurologic:  Normal speech and language. No gross focal neurologic deficits are appreciated. Skin:  Skin is warm and dry. No rash noted. Psychiatric: Mood and affect are normal. Speech and behavior are normal.  ____________________________________________   LABS (all labs ordered are listed, but only abnormal results are displayed)  Labs Reviewed  COMPREHENSIVE METABOLIC PANEL - Abnormal; Notable for the following components:      Result Value   Glucose, Bld 187 (*)    BUN 28 (*)    Creatinine, Ser 3.22 (*)    Total Protein 5.9 (*)    Albumin 3.1 (*)    GFR, Estimated 13 (*)    All other components within normal limits  CBC - Abnormal; Notable for the following components:   RBC 2.73 (*)    Hemoglobin 7.8 (*)    HCT 25.4 (*)    All other components within normal limits  PROTIME-INR  POC OCCULT BLOOD, ED  TYPE AND SCREEN  TYPE AND SCREEN    ____________________________________________  EKG  ED ECG REPORT I, Naaman Plummer, the attending physician, personally viewed and interpreted this ECG.  Date: 07/22/2020 EKG Time: 1231 Rate: 57 Rhythm: normal sinus rhythm QRS Axis: normal Intervals: normal ST/T Wave abnormalities: normal Narrative Interpretation: no evidence of acute ischemia  PROCEDURES  Procedure(s) performed (including Critical Care):  .1-3 Lead EKG Interpretation Performed by: Naaman Plummer, MD Authorized by: Naaman Plummer, MD     Interpretation: normal     ECG rate:  66  ECG rate assessment: normal     Rhythm: sinus rhythm     Ectopy: none     Conduction: normal       ____________________________________________   INITIAL IMPRESSION / ASSESSMENT AND PLAN / ED COURSE  As part of my medical decision making, I reviewed the following data within the [facility] notes reviewed and incorporated, Labs reviewed, EKG interpreted, Old chart reviewed, and Notes from prior ED visits reviewed and incorporated     Patient 87 year old female that presents for left inguinal pain at the site of a previous surgery.  Differential diagnosis includes but is not limited to: GI bleeding, surgical site infection, constipation, SBO  Patient's laboratory evaluation shows no red flags.  Patient's pain resolved spontaneously  The patient has been reexamined and is ready to be discharged.  All diagnostic results have been reviewed and discussed with the patient/family.  Care plan has been outlined and the patient/family understands all current diagnoses, results, and treatment plans.  There are no new complaints, changes, or physical findings at this time.  All questions have been addressed and answered.  Patient was instructed to, and agrees to follow-up with their primary care physician as well as return to the emergency department if any new or worsening symptoms develop.       ____________________________________________   FINAL CLINICAL IMPRESSION(S) / ED DIAGNOSES  Final diagnoses:  Left lower quadrant abdominal pain     ED Discharge Orders    None       Note:  This document was prepared using Dragon voice recognition software and may include unintentional dictation errors.   Naaman Plummer, MD 07/22/20 [phone]

## 2020-07-22 NOTE — ED Notes (Signed)
Lab called for type and screen recollect, patient had completed type and screen 5 days ago and does not need another one at this time unless MD request. MD aware.

## 2020-07-22 NOTE — Progress Notes (Deleted)
      Established patient visit   Patient: Lori Stanley   DOB: Oct 05, 1931   87 y.o. Female  MRN: [id number] Visit Date: 07/22/2020  Today's healthcare provider: [name], MD   No chief complaint on file.  Subjective    HPI  Follow up Hospitalization  Patient was admitted to [facility] on 07/17/2020 and discharged on 07/20/2020. She was treated for LLQ pain, pain around incision area. S/P hernia repair on 07/12/2020.  Treatment for this included admitted to general surgery for observation. Her Hgb was monitored and stable x48 hours. Nephrology was consulted for HD given her ESRD. The remainder of patient's hospital course was essentially unremarkable.  She reports {excellent/good/fair:19992} compliance with treatment. She reports this condition is {resolved/improved/worsened:23923}.  ----------------------------------------------------------------------------------------- -    {Show patient history (optional):23778::" "}   Medications: Outpatient Medications Prior to Visit  Medication Sig  . acetaminophen (TYLENOL) 500 MG tablet Take 500-1,000 mg by mouth every 6 (six) hours as needed for mild pain or fever.   [name] albuterol (VENTOLIN HFA) 108 (90 Base) MCG/ACT inhaler TAKE 2 PUFFS BY MOUTH EVERY 6 HOURS AS NEEDED FOR WHEEZE OR SHORTNESS OF BREATH (Patient taking differently: Inhale 2 puffs into the lungs every 6 (six) hours as needed for wheezing or shortness of breath.)  . amLODipine (NORVASC) 5 MG tablet Take 1 tablet (5 mg total) by mouth daily.  . Bepotastine Besilate 1.5 % SOLN Place 1 drop into both eyes in the morning and at bedtime. Morning & afternoon.  . brimonidine (ALPHAGAN) 0.2 % ophthalmic solution Place 1 drop into both eyes 2 (two) times daily.   . calcitRIOL (ROCALTROL) 0.25 MCG capsule Take 0.25 mcg by mouth every Monday, Wednesday, and Friday.   . carvedilol (COREG) 12.5 MG tablet TAKE 1 TABLET (12.5 MG TOTAL) BY MOUTH 2 (TWO) TIMES DAILY.  . CVS ASPIRIN  ADULT LOW DOSE 81 MG chewable tablet CHEW 1 TABLET (81 MG TOTAL) BY MOUTH DAILY.  [name] dorzolamide-timolol (COSOPT) 22.3-6.8 MG/ML ophthalmic solution Place 1 drop into both eyes 2 (two) times daily.   [name] latanoprost (XALATAN) 0.005 % ophthalmic solution Place 1 drop into both eyes at bedtime.  . multivitamin (RENA-VIT) TABS tablet Take 1 tablet by mouth daily.   . OXYGEN Inhale 2 L into the lungs at bedtime.  . sertraline (ZOLOFT) 25 MG tablet TAKE 1 TABLET BY MOUTH EVERY DAY (Patient taking differently: Take 25 mg by mouth daily.)  . torsemide (DEMADEX) 100 MG tablet Take 100 mg by mouth daily.  . traZODone (DESYREL) 150 MG tablet TAKE 1 TABLET (150 MG TOTAL) BY MOUTH AT BEDTIME. (Patient taking differently: Take 150 mg by mouth at bedtime.)   No facility-administered medications prior to visit.    Review of Systems  {Labs  Heme  Chem  Endocrine  Serology  Results Review (optional):23779::" "}   Objective    There were no vitals taken for this visit. {Show previous vital signs (optional):23777::" "}   Physical Exam  ***  No results found for any visits on 07/22/20.  Assessment & Plan     ***  No follow-ups on file.      {provider attestation***:1}   [name], MD  [facility] [phone] (phone) [phone] (fax)  [employer]

## 2020-07-22 NOTE — ED Triage Notes (Signed)
Pt comes pov with post op complications from hernia surgery. Pt was seen recently for bleeding around the site but was seen today by surgeon and site was fine. JP drain intact. Pt c/o abd pain. Pt was helped out of car. Lethargic, smells of a GI bleed, reports needing to go to the bathroom.

## 2020-07-22 NOTE — ED Notes (Signed)
Patient reports she is feeling much better, states she still has intermittent abdominal cramping.

## 2020-07-22 NOTE — ED Notes (Signed)
Patient reports she is feeling much better, Dr Cheri Fowler aware.

## 2020-07-22 NOTE — Patient Instructions (Addendum)
We removed the staples and placed steri strips. These will fall off within 10-14 days. You may shower as usual. Do not scrub over the area. Continue to keep a record of the contents from the drain.  Please see your follow up appointment listed below.      GENERAL POST-OPERATIVE PATIENT INSTRUCTIONS   WOUND CARE INSTRUCTIONS:  Keep a dry clean dressing on the wound if there is drainage. The initial bandage may be removed after 24 hours.  Once the wound has quit draining you may leave it open to air.  If clothing rubs against the wound or causes irritation and the wound is not draining you may cover it with a dry dressing during the daytime.  Try to keep the wound dry and avoid ointments on the wound unless directed to do so.  If the wound becomes bright red and painful or starts to drain infected material that is not clear, please contact your physician immediately.  If the wound is mildly pink and has a thick firm ridge underneath it, this is normal, and is referred to as a healing ridge.  This will resolve over the next 4-6 weeks.  BATHING: You may shower if you have been informed of this by your surgeon. However, Please do not submerge in a tub, hot tub, or pool until incisions are completely sealed or have been told by your surgeon that you may do so.  DIET:  You may eat any foods that you can tolerate.  It is a good idea to eat a high fiber diet and take in plenty of fluids to prevent constipation.  If you do become constipated you may want to take a mild laxative or take ducolax tablets on a daily basis until your bowel habits are regular.  Constipation can be very uncomfortable, along with straining, after recent surgery.  ACTIVITY:  You are encouraged to cough and deep breath or use your incentive spirometer if you were given one, every 15-30 minutes when awake.  This will help prevent respiratory complications and low grade fevers post-operatively if you had a general anesthetic.  You may  want to hug a pillow when coughing and sneezing to add additional support to the surgical area, if you had abdominal or chest surgery, which will decrease pain during these times.  You are encouraged to walk and engage in light activity for the next two weeks.  You should not lift more than 20 pounds, until 08/23/2020 as it could put you at increased risk for complications.  Twenty pounds is roughly equivalent to a plastic bag of groceries. At that time- Listen to your body when lifting, if you have pain when lifting, stop and then try again in a few days. Soreness after doing exercises or activities of daily living is normal as you get back in to your normal routine.  MEDICATIONS:  Try to take narcotic medications and anti-inflammatory medications, such as tylenol, ibuprofen, naprosyn, etc., with food.  This will minimize stomach upset from the medication.  Should you develop nausea and vomiting from the pain medication, or develop a rash, please discontinue the medication and contact your physician.  You should not drive, make important decisions, or operate machinery when taking narcotic pain medication.  SUNBLOCK Use sun block to incision area over the next year if this area will be exposed to sun. This helps decrease scarring and will allow you avoid a permanent darkened area over your incision.  QUESTIONS:  Please feel free to call  our office if you have any questions, and we will be glad to assist you. [phone]

## 2020-07-24 DIAGNOSIS — N186 End stage renal disease: Secondary | ICD-10-CM | POA: Diagnosis not present

## 2020-07-24 DIAGNOSIS — N2581 Secondary hyperparathyroidism of renal origin: Secondary | ICD-10-CM | POA: Diagnosis not present

## 2020-07-24 DIAGNOSIS — Z992 Dependence on renal dialysis: Secondary | ICD-10-CM | POA: Diagnosis not present

## 2020-07-27 DIAGNOSIS — Z992 Dependence on renal dialysis: Secondary | ICD-10-CM | POA: Diagnosis not present

## 2020-07-27 DIAGNOSIS — N186 End stage renal disease: Secondary | ICD-10-CM | POA: Diagnosis not present

## 2020-07-27 DIAGNOSIS — R0989 Other specified symptoms and signs involving the circulatory and respiratory systems: Secondary | ICD-10-CM | POA: Diagnosis not present

## 2020-07-27 DIAGNOSIS — R059 Cough, unspecified: Secondary | ICD-10-CM | POA: Diagnosis not present

## 2020-07-28 ENCOUNTER — Encounter: Payer: Medicare Other | Admitting: Family Medicine

## 2020-07-28 NOTE — Progress Notes (Deleted)
Virtual telephone visit    Virtual Visit via Telephone Note   This visit type was conducted due to national recommendations for restrictions regarding the COVID-19 Pandemic (e.g. social distancing) in an effort to limit this patient's exposure and mitigate transmission in our community. Due to her co-morbid illnesses, this patient is at least at moderate risk for complications without adequate follow up. This format is felt to be most appropriate for this patient at this time. The patient did not have access to video technology or had technical difficulties with video requiring transitioning to audio format only (telephone). Physical exam was limited to content and character of the telephone converstion.    Patient location: *** Provider location: ***  I discussed the limitations of evaluation and management by telemedicine and the availability of in person appointments. The patient expressed understanding and agreed to proceed.   Visit Date: 07/28/2020  Today's healthcare provider: [name], MD   No chief complaint on file.  Subjective    URI  This is a new problem. The current episode started in the past 7 days. There has been no fever. Associated symptoms include congestion, coughing, headaches and sinus pain. She has tried decongestant for the symptoms. The treatment provided mild relief.    ***    {Show patient history (optional):23778::" "}  Medications: Outpatient Medications Prior to Visit  Medication Sig  . acetaminophen (TYLENOL) 500 MG tablet Take 500-1,000 mg by mouth every 6 (six) hours as needed for mild pain or fever.   [name] albuterol (VENTOLIN HFA) 108 (90 Base) MCG/ACT inhaler TAKE 2 PUFFS BY MOUTH EVERY 6 HOURS AS NEEDED FOR WHEEZE OR SHORTNESS OF BREATH (Patient taking differently: Inhale 2 puffs into the lungs every 6 (six) hours as needed for wheezing or shortness of breath.)  . amLODipine (NORVASC) 5 MG tablet Take 1 tablet (5 mg total) by mouth  daily.  . Bepotastine Besilate 1.5 % SOLN Place 1 drop into both eyes in the morning and at bedtime. Morning & afternoon.  . brimonidine (ALPHAGAN) 0.2 % ophthalmic solution Place 1 drop into both eyes 2 (two) times daily.   . calcitRIOL (ROCALTROL) 0.25 MCG capsule Take 0.25 mcg by mouth every Monday, Wednesday, and Friday.   . carvedilol (COREG) 12.5 MG tablet TAKE 1 TABLET (12.5 MG TOTAL) BY MOUTH 2 (TWO) TIMES DAILY.  . [employer] ASPIRIN ADULT LOW DOSE 81 MG chewable tablet CHEW 1 TABLET (81 MG TOTAL) BY MOUTH DAILY.  [name] dorzolamide-timolol (COSOPT) 22.3-6.8 MG/ML ophthalmic solution Place 1 drop into both eyes 2 (two) times daily.   [name] latanoprost (XALATAN) 0.005 % ophthalmic solution Place 1 drop into both eyes at bedtime.  . multivitamin (RENA-VIT) TABS tablet Take 1 tablet by mouth daily.   . OXYGEN Inhale 2 L into the lungs at bedtime.  . sertraline (ZOLOFT) 25 MG tablet TAKE 1 TABLET BY MOUTH EVERY DAY (Patient taking differently: Take 25 mg by mouth daily.)  . torsemide (DEMADEX) 100 MG tablet Take 100 mg by mouth daily.  . traZODone (DESYREL) 150 MG tablet TAKE 1 TABLET (150 MG TOTAL) BY MOUTH AT BEDTIME. (Patient taking differently: Take 150 mg by mouth at bedtime.)   No facility-administered medications prior to visit.    Review of Systems  HENT: Positive for congestion and sinus pain.   Respiratory: Positive for cough.   Neurological: Positive for headaches.    {Labs  Heme  Chem  Endocrine  Serology  Results Review (optional):23779::" "}  Objective  There were no vitals taken for this visit. {Show previous vital signs (optional):23777::" "}    Assessment & Plan     ***  No follow-ups on file.    I discussed the assessment and treatment plan with the patient. The patient was provided an opportunity to ask questions and all were answered. The patient agreed with the plan and demonstrated an understanding of the instructions.   The patient was advised to call back  or seek an in-person evaluation if the symptoms worsen or if the condition fails to improve as anticipated.  I provided *** minutes of non-face-to-face time during this encounter.  {provider attestation***:1}  [name], MD [facility] [phone] (phone) [phone] (fax)  [place]

## 2020-07-29 ENCOUNTER — Other Ambulatory Visit: Payer: Self-pay

## 2020-07-29 ENCOUNTER — Ambulatory Visit (INDEPENDENT_AMBULATORY_CARE_PROVIDER_SITE_OTHER): Payer: Medicare Other | Admitting: Surgery

## 2020-07-29 ENCOUNTER — Encounter: Payer: Self-pay | Admitting: Surgery

## 2020-07-29 VITALS — BP 119/57 | HR 87 | Temp 98.2°F | Ht 63.0 in | Wt 117.6 lb

## 2020-07-29 DIAGNOSIS — T148XXA Other injury of unspecified body region, initial encounter: Secondary | ICD-10-CM

## 2020-07-29 DIAGNOSIS — J309 Allergic rhinitis, unspecified: Secondary | ICD-10-CM

## 2020-07-29 DIAGNOSIS — J329 Chronic sinusitis, unspecified: Secondary | ICD-10-CM

## 2020-07-29 DIAGNOSIS — K4030 Unilateral inguinal hernia, with obstruction, without gangrene, not specified as recurrent: Secondary | ICD-10-CM

## 2020-07-29 DIAGNOSIS — Z09 Encounter for follow-up examination after completed treatment for conditions other than malignant neoplasm: Secondary | ICD-10-CM

## 2020-07-29 MED ORDER — DOXYCYCLINE HYCLATE 100 MG PO TABS: 100.0000 mg | ORAL_TABLET | Freq: Two times a day (BID) | ORAL | 0 refills | Status: AC

## 2020-07-29 MED ORDER — LORATADINE 10 MG PO TABS: 10.0000 mg | ORAL_TABLET | Freq: Every day | ORAL | 11 refills | Status: DC

## 2020-07-29 NOTE — Progress Notes (Signed)
07/29/2020  HPI: Lori Stanley is a 87 y.o. female s/p left femoral incarcerated hernia repair on 07/11/20.  She was readmitted on 3/18 with hematoma of the left groin.  Denies much drainage from the drain itself, but she does notice some bleeding around the drain.  Denies any significant pain, but reports some discomfort in the left groin where the hematoma is located.    Vital signs: BP (!) 119/57   Pulse 87   Temp 98.2 F (36.8 C) (Oral)   Ht '5\' 3"'$  (1.6 m)   Wt 117 lb 9.6 oz (53.3 kg)   SpO2 (!) 86%   BMI 20.83 kg/m    Physical Exam: Constitutional:  No acute distress Abdomen:  Soft, non-distended, with some discomfort in the left groin.  There is a moderate size hematoma consistent with the previous findings.  Stable in size and soft in palpation.  Blake drain in place with old blood and clot in drain.  No bright red blood.  Assessment/Plan: This is a 87 y.o. female s/p left femoral incarcerated hernia repair, complicated by hematoma.  --The patient's hematoma is stable.  Drain was removed at bedside without complications.  Dry gauze dressing applied. --Follow up in 3 weeks to evaluate the hematoma.   Melvyn Neth, [facility]

## 2020-07-29 NOTE — Telephone Encounter (Signed)
Medication sent in per Dr. [name]. Pt stopped by the office to f/u televisit yesterday (07/28/2020)

## 2020-07-29 NOTE — Patient Instructions (Addendum)
Keep wound dry. You may take it off to shower and then reapply a new dry dressing. Repeat this daily. See follow up appointment below. If you have any concerns or questions, please feel free to contact our office.

## 2020-07-30 DIAGNOSIS — N186 End stage renal disease: Secondary | ICD-10-CM | POA: Diagnosis not present

## 2020-07-30 DIAGNOSIS — Z992 Dependence on renal dialysis: Secondary | ICD-10-CM | POA: Diagnosis not present

## 2020-07-31 DIAGNOSIS — Z992 Dependence on renal dialysis: Secondary | ICD-10-CM | POA: Diagnosis not present

## 2020-07-31 DIAGNOSIS — N186 End stage renal disease: Secondary | ICD-10-CM | POA: Diagnosis not present

## 2020-08-01 ENCOUNTER — Other Ambulatory Visit: Payer: Self-pay | Admitting: Family Medicine

## 2020-08-01 DIAGNOSIS — F329 Major depressive disorder, single episode, unspecified: Secondary | ICD-10-CM

## 2020-08-03 DIAGNOSIS — N186 End stage renal disease: Secondary | ICD-10-CM | POA: Diagnosis not present

## 2020-08-03 DIAGNOSIS — Z992 Dependence on renal dialysis: Secondary | ICD-10-CM | POA: Diagnosis not present

## 2020-08-04 DIAGNOSIS — K219 Gastro-esophageal reflux disease without esophagitis: Secondary | ICD-10-CM | POA: Diagnosis not present

## 2020-08-04 DIAGNOSIS — I5032 Chronic diastolic (congestive) heart failure: Secondary | ICD-10-CM | POA: Diagnosis not present

## 2020-08-04 DIAGNOSIS — Z48815 Encounter for surgical aftercare following surgery on the digestive system: Secondary | ICD-10-CM | POA: Diagnosis not present

## 2020-08-04 DIAGNOSIS — E1122 Type 2 diabetes mellitus with diabetic chronic kidney disease: Secondary | ICD-10-CM | POA: Diagnosis not present

## 2020-08-04 DIAGNOSIS — I132 Hypertensive heart and chronic kidney disease with heart failure and with stage 5 chronic kidney disease, or end stage renal disease: Secondary | ICD-10-CM | POA: Diagnosis not present

## 2020-08-04 DIAGNOSIS — I252 Old myocardial infarction: Secondary | ICD-10-CM | POA: Diagnosis not present

## 2020-08-04 DIAGNOSIS — Z992 Dependence on renal dialysis: Secondary | ICD-10-CM | POA: Diagnosis not present

## 2020-08-04 DIAGNOSIS — I313 Pericardial effusion (noninflammatory): Secondary | ICD-10-CM | POA: Diagnosis not present

## 2020-08-04 DIAGNOSIS — G8929 Other chronic pain: Secondary | ICD-10-CM | POA: Diagnosis not present

## 2020-08-04 DIAGNOSIS — I2510 Atherosclerotic heart disease of native coronary artery without angina pectoris: Secondary | ICD-10-CM | POA: Diagnosis not present

## 2020-08-04 DIAGNOSIS — D631 Anemia in chronic kidney disease: Secondary | ICD-10-CM | POA: Diagnosis not present

## 2020-08-04 DIAGNOSIS — K56609 Unspecified intestinal obstruction, unspecified as to partial versus complete obstruction: Secondary | ICD-10-CM | POA: Diagnosis not present

## 2020-08-04 DIAGNOSIS — E212 Other hyperparathyroidism: Secondary | ICD-10-CM | POA: Diagnosis not present

## 2020-08-04 DIAGNOSIS — E1165 Type 2 diabetes mellitus with hyperglycemia: Secondary | ICD-10-CM | POA: Diagnosis not present

## 2020-08-04 DIAGNOSIS — Z6822 Body mass index (BMI) 22.0-22.9, adult: Secondary | ICD-10-CM | POA: Diagnosis not present

## 2020-08-04 DIAGNOSIS — N186 End stage renal disease: Secondary | ICD-10-CM | POA: Diagnosis not present

## 2020-08-04 DIAGNOSIS — F32A Depression, unspecified: Secondary | ICD-10-CM | POA: Diagnosis not present

## 2020-08-04 DIAGNOSIS — M545 Low back pain, unspecified: Secondary | ICD-10-CM | POA: Diagnosis not present

## 2020-08-04 DIAGNOSIS — Z87891 Personal history of nicotine dependence: Secondary | ICD-10-CM | POA: Diagnosis not present

## 2020-08-04 DIAGNOSIS — Z9981 Dependence on supplemental oxygen: Secondary | ICD-10-CM | POA: Diagnosis not present

## 2020-08-07 DIAGNOSIS — N186 End stage renal disease: Secondary | ICD-10-CM | POA: Diagnosis not present

## 2020-08-07 DIAGNOSIS — Z992 Dependence on renal dialysis: Secondary | ICD-10-CM | POA: Diagnosis not present

## 2020-08-10 ENCOUNTER — Inpatient Hospital Stay: Payer: Medicare Other | Admitting: Family Medicine

## 2020-08-10 DIAGNOSIS — N2581 Secondary hyperparathyroidism of renal origin: Secondary | ICD-10-CM | POA: Diagnosis not present

## 2020-08-10 DIAGNOSIS — Z992 Dependence on renal dialysis: Secondary | ICD-10-CM | POA: Diagnosis not present

## 2020-08-10 DIAGNOSIS — N186 End stage renal disease: Secondary | ICD-10-CM | POA: Diagnosis not present

## 2020-08-10 NOTE — Progress Notes (Deleted)
      Established patient visit   Patient: Lori Stanley   DOB: 1931-08-31   87 y.o. Female  MRN: [id number] Visit Date: 08/10/2020  Today's healthcare provider: [name], MD   No chief complaint on file.  Subjective    HPI  Follow up ER visit  Patient was seen in ER for Abdominal pain on 07/28/2020. She was treated for abdominal pain. Treatment for this included; see notes in chart. She reports {DESC; EXCELLENT/GOOD/FAIR:19992} compliance with treatment. She reports this condition is {improved/worse/unchanged:3041574}.  -----------------------------------------------------------------------------------------    {Show patient history (optional):23778::" "}   Medications: Outpatient Medications Prior to Visit  Medication Sig  . acetaminophen (TYLENOL) 500 MG tablet Take 500-1,000 mg by mouth every 6 (six) hours as needed for mild pain or fever.   [name] albuterol (VENTOLIN HFA) 108 (90 Base) MCG/ACT inhaler TAKE 2 PUFFS BY MOUTH EVERY 6 HOURS AS NEEDED FOR WHEEZE OR SHORTNESS OF BREATH (Patient taking differently: Inhale 2 puffs into the lungs every 6 (six) hours as needed for wheezing or shortness of breath.)  . amLODipine (NORVASC) 5 MG tablet Take 1 tablet (5 mg total) by mouth daily.  . Bepotastine Besilate 1.5 % SOLN Place 1 drop into both eyes in the morning and at bedtime. Morning & afternoon.  . brimonidine (ALPHAGAN) 0.2 % ophthalmic solution Place 1 drop into both eyes 2 (two) times daily.   . calcitRIOL (ROCALTROL) 0.25 MCG capsule Take 0.25 mcg by mouth every Monday, Wednesday, and Friday.   . carvedilol (COREG) 12.5 MG tablet TAKE 1 TABLET (12.5 MG TOTAL) BY MOUTH 2 (TWO) TIMES DAILY.  . [employer] ASPIRIN ADULT LOW DOSE 81 MG chewable tablet CHEW 1 TABLET (81 MG TOTAL) BY MOUTH DAILY.  [name] dorzolamide-timolol (COSOPT) 22.3-6.8 MG/ML ophthalmic solution Place 1 drop into both eyes 2 (two) times daily.   [name] latanoprost (XALATAN) 0.005 % ophthalmic solution Place 1  drop into both eyes at bedtime.  [name] loratadine (CLARITIN) 10 MG tablet Take 1 tablet (10 mg total) by mouth daily.  . multivitamin (RENA-VIT) TABS tablet Take 1 tablet by mouth daily.   . OXYGEN Inhale 2 L into the lungs at bedtime.  . sertraline (ZOLOFT) 25 MG tablet TAKE 1 TABLET BY MOUTH EVERY DAY (Patient taking differently: Take 25 mg by mouth daily.)  . torsemide (DEMADEX) 100 MG tablet Take 100 mg by mouth daily.  . traZODone (DESYREL) 150 MG tablet TAKE 1 TABLET (150 MG TOTAL) BY MOUTH AT BEDTIME. (Patient taking differently: Take 150 mg by mouth at bedtime.)   No facility-administered medications prior to visit.    Review of Systems  Constitutional: Negative for appetite change, chills, fatigue and fever.  Respiratory: Negative for chest tightness and shortness of breath.   Cardiovascular: Negative for chest pain and palpitations.  Gastrointestinal: Negative for abdominal pain, nausea and vomiting.  Neurological: Negative for dizziness and weakness.    {Labs  Heme  Chem  Endocrine  Serology  Results Review (optional):23779::" "}   Objective    There were no vitals taken for this visit. {Show previous vital signs (optional):23777::" "}   Physical Exam  ***  No results found for any visits on 08/10/20.  Assessment & Plan     ***  No follow-ups on file.      {provider attestation***:1}   [name], MD  [facility] [phone] (phone) [phone] (fax)  [place]

## 2020-08-10 NOTE — Progress Notes (Signed)
This encounter was created in error - please disregard.

## 2020-08-11 DIAGNOSIS — I313 Pericardial effusion (noninflammatory): Secondary | ICD-10-CM | POA: Diagnosis not present

## 2020-08-11 DIAGNOSIS — Z992 Dependence on renal dialysis: Secondary | ICD-10-CM | POA: Diagnosis not present

## 2020-08-11 DIAGNOSIS — Z6822 Body mass index (BMI) 22.0-22.9, adult: Secondary | ICD-10-CM | POA: Diagnosis not present

## 2020-08-11 DIAGNOSIS — Z87891 Personal history of nicotine dependence: Secondary | ICD-10-CM | POA: Diagnosis not present

## 2020-08-11 DIAGNOSIS — I252 Old myocardial infarction: Secondary | ICD-10-CM | POA: Diagnosis not present

## 2020-08-11 DIAGNOSIS — I2510 Atherosclerotic heart disease of native coronary artery without angina pectoris: Secondary | ICD-10-CM | POA: Diagnosis not present

## 2020-08-11 DIAGNOSIS — E212 Other hyperparathyroidism: Secondary | ICD-10-CM | POA: Diagnosis not present

## 2020-08-11 DIAGNOSIS — Z48815 Encounter for surgical aftercare following surgery on the digestive system: Secondary | ICD-10-CM | POA: Diagnosis not present

## 2020-08-11 DIAGNOSIS — E1122 Type 2 diabetes mellitus with diabetic chronic kidney disease: Secondary | ICD-10-CM | POA: Diagnosis not present

## 2020-08-11 DIAGNOSIS — Z9981 Dependence on supplemental oxygen: Secondary | ICD-10-CM | POA: Diagnosis not present

## 2020-08-11 DIAGNOSIS — M545 Low back pain, unspecified: Secondary | ICD-10-CM | POA: Diagnosis not present

## 2020-08-11 DIAGNOSIS — E1165 Type 2 diabetes mellitus with hyperglycemia: Secondary | ICD-10-CM | POA: Diagnosis not present

## 2020-08-11 DIAGNOSIS — I132 Hypertensive heart and chronic kidney disease with heart failure and with stage 5 chronic kidney disease, or end stage renal disease: Secondary | ICD-10-CM | POA: Diagnosis not present

## 2020-08-11 DIAGNOSIS — D631 Anemia in chronic kidney disease: Secondary | ICD-10-CM | POA: Diagnosis not present

## 2020-08-11 DIAGNOSIS — K56609 Unspecified intestinal obstruction, unspecified as to partial versus complete obstruction: Secondary | ICD-10-CM | POA: Diagnosis not present

## 2020-08-11 DIAGNOSIS — N186 End stage renal disease: Secondary | ICD-10-CM | POA: Diagnosis not present

## 2020-08-11 DIAGNOSIS — G8929 Other chronic pain: Secondary | ICD-10-CM | POA: Diagnosis not present

## 2020-08-11 DIAGNOSIS — I5032 Chronic diastolic (congestive) heart failure: Secondary | ICD-10-CM | POA: Diagnosis not present

## 2020-08-11 DIAGNOSIS — K219 Gastro-esophageal reflux disease without esophagitis: Secondary | ICD-10-CM | POA: Diagnosis not present

## 2020-08-11 DIAGNOSIS — F32A Depression, unspecified: Secondary | ICD-10-CM | POA: Diagnosis not present

## 2020-08-12 ENCOUNTER — Telehealth: Payer: Self-pay | Admitting: Family Medicine

## 2020-08-12 DIAGNOSIS — K56609 Unspecified intestinal obstruction, unspecified as to partial versus complete obstruction: Secondary | ICD-10-CM | POA: Diagnosis not present

## 2020-08-12 DIAGNOSIS — Z48815 Encounter for surgical aftercare following surgery on the digestive system: Secondary | ICD-10-CM | POA: Diagnosis not present

## 2020-08-12 DIAGNOSIS — I2510 Atherosclerotic heart disease of native coronary artery without angina pectoris: Secondary | ICD-10-CM | POA: Diagnosis not present

## 2020-08-12 DIAGNOSIS — I132 Hypertensive heart and chronic kidney disease with heart failure and with stage 5 chronic kidney disease, or end stage renal disease: Secondary | ICD-10-CM | POA: Diagnosis not present

## 2020-08-12 DIAGNOSIS — I252 Old myocardial infarction: Secondary | ICD-10-CM | POA: Diagnosis not present

## 2020-08-12 DIAGNOSIS — D631 Anemia in chronic kidney disease: Secondary | ICD-10-CM | POA: Diagnosis not present

## 2020-08-12 DIAGNOSIS — Z992 Dependence on renal dialysis: Secondary | ICD-10-CM | POA: Diagnosis not present

## 2020-08-12 DIAGNOSIS — F32A Depression, unspecified: Secondary | ICD-10-CM | POA: Diagnosis not present

## 2020-08-12 DIAGNOSIS — E212 Other hyperparathyroidism: Secondary | ICD-10-CM | POA: Diagnosis not present

## 2020-08-12 DIAGNOSIS — N186 End stage renal disease: Secondary | ICD-10-CM | POA: Diagnosis not present

## 2020-08-12 DIAGNOSIS — Z87891 Personal history of nicotine dependence: Secondary | ICD-10-CM | POA: Diagnosis not present

## 2020-08-12 DIAGNOSIS — Z9981 Dependence on supplemental oxygen: Secondary | ICD-10-CM | POA: Diagnosis not present

## 2020-08-12 DIAGNOSIS — M545 Low back pain, unspecified: Secondary | ICD-10-CM | POA: Diagnosis not present

## 2020-08-12 DIAGNOSIS — E1165 Type 2 diabetes mellitus with hyperglycemia: Secondary | ICD-10-CM | POA: Diagnosis not present

## 2020-08-12 DIAGNOSIS — E1122 Type 2 diabetes mellitus with diabetic chronic kidney disease: Secondary | ICD-10-CM | POA: Diagnosis not present

## 2020-08-12 DIAGNOSIS — I5032 Chronic diastolic (congestive) heart failure: Secondary | ICD-10-CM | POA: Diagnosis not present

## 2020-08-12 DIAGNOSIS — G8929 Other chronic pain: Secondary | ICD-10-CM | POA: Diagnosis not present

## 2020-08-12 DIAGNOSIS — I313 Pericardial effusion (noninflammatory): Secondary | ICD-10-CM | POA: Diagnosis not present

## 2020-08-12 DIAGNOSIS — K219 Gastro-esophageal reflux disease without esophagitis: Secondary | ICD-10-CM | POA: Diagnosis not present

## 2020-08-12 DIAGNOSIS — Z6822 Body mass index (BMI) 22.0-22.9, adult: Secondary | ICD-10-CM | POA: Diagnosis not present

## 2020-08-12 NOTE — Telephone Encounter (Signed)
Called to request verbal orders for OT - 1xwk 4.  Any questions, please call [phone]

## 2020-08-12 NOTE — Telephone Encounter (Signed)
Lori Stanley was given verbal okay.

## 2020-08-13 DIAGNOSIS — D631 Anemia in chronic kidney disease: Secondary | ICD-10-CM | POA: Diagnosis not present

## 2020-08-13 DIAGNOSIS — Z992 Dependence on renal dialysis: Secondary | ICD-10-CM | POA: Diagnosis not present

## 2020-08-13 DIAGNOSIS — E1122 Type 2 diabetes mellitus with diabetic chronic kidney disease: Secondary | ICD-10-CM | POA: Diagnosis not present

## 2020-08-13 DIAGNOSIS — K219 Gastro-esophageal reflux disease without esophagitis: Secondary | ICD-10-CM | POA: Diagnosis not present

## 2020-08-13 DIAGNOSIS — I132 Hypertensive heart and chronic kidney disease with heart failure and with stage 5 chronic kidney disease, or end stage renal disease: Secondary | ICD-10-CM | POA: Diagnosis not present

## 2020-08-13 DIAGNOSIS — Z87891 Personal history of nicotine dependence: Secondary | ICD-10-CM | POA: Diagnosis not present

## 2020-08-13 DIAGNOSIS — Z6822 Body mass index (BMI) 22.0-22.9, adult: Secondary | ICD-10-CM | POA: Diagnosis not present

## 2020-08-13 DIAGNOSIS — Z9981 Dependence on supplemental oxygen: Secondary | ICD-10-CM | POA: Diagnosis not present

## 2020-08-13 DIAGNOSIS — K56609 Unspecified intestinal obstruction, unspecified as to partial versus complete obstruction: Secondary | ICD-10-CM | POA: Diagnosis not present

## 2020-08-13 DIAGNOSIS — E1165 Type 2 diabetes mellitus with hyperglycemia: Secondary | ICD-10-CM | POA: Diagnosis not present

## 2020-08-13 DIAGNOSIS — G8929 Other chronic pain: Secondary | ICD-10-CM | POA: Diagnosis not present

## 2020-08-13 DIAGNOSIS — I252 Old myocardial infarction: Secondary | ICD-10-CM | POA: Diagnosis not present

## 2020-08-13 DIAGNOSIS — E212 Other hyperparathyroidism: Secondary | ICD-10-CM | POA: Diagnosis not present

## 2020-08-13 DIAGNOSIS — Z48815 Encounter for surgical aftercare following surgery on the digestive system: Secondary | ICD-10-CM | POA: Diagnosis not present

## 2020-08-13 DIAGNOSIS — N186 End stage renal disease: Secondary | ICD-10-CM | POA: Diagnosis not present

## 2020-08-13 DIAGNOSIS — M545 Low back pain, unspecified: Secondary | ICD-10-CM | POA: Diagnosis not present

## 2020-08-13 DIAGNOSIS — I2510 Atherosclerotic heart disease of native coronary artery without angina pectoris: Secondary | ICD-10-CM | POA: Diagnosis not present

## 2020-08-13 DIAGNOSIS — I313 Pericardial effusion (noninflammatory): Secondary | ICD-10-CM | POA: Diagnosis not present

## 2020-08-13 DIAGNOSIS — I5032 Chronic diastolic (congestive) heart failure: Secondary | ICD-10-CM | POA: Diagnosis not present

## 2020-08-13 DIAGNOSIS — F32A Depression, unspecified: Secondary | ICD-10-CM | POA: Diagnosis not present

## 2020-08-14 DIAGNOSIS — Z992 Dependence on renal dialysis: Secondary | ICD-10-CM | POA: Diagnosis not present

## 2020-08-14 DIAGNOSIS — N186 End stage renal disease: Secondary | ICD-10-CM | POA: Diagnosis not present

## 2020-08-15 DIAGNOSIS — I5022 Chronic systolic (congestive) heart failure: Secondary | ICD-10-CM | POA: Diagnosis not present

## 2020-08-17 DIAGNOSIS — Z992 Dependence on renal dialysis: Secondary | ICD-10-CM | POA: Diagnosis not present

## 2020-08-17 DIAGNOSIS — N186 End stage renal disease: Secondary | ICD-10-CM | POA: Diagnosis not present

## 2020-08-18 DIAGNOSIS — Z6822 Body mass index (BMI) 22.0-22.9, adult: Secondary | ICD-10-CM | POA: Diagnosis not present

## 2020-08-18 DIAGNOSIS — N186 End stage renal disease: Secondary | ICD-10-CM | POA: Diagnosis not present

## 2020-08-18 DIAGNOSIS — G8929 Other chronic pain: Secondary | ICD-10-CM | POA: Diagnosis not present

## 2020-08-18 DIAGNOSIS — D631 Anemia in chronic kidney disease: Secondary | ICD-10-CM | POA: Diagnosis not present

## 2020-08-18 DIAGNOSIS — I252 Old myocardial infarction: Secondary | ICD-10-CM | POA: Diagnosis not present

## 2020-08-18 DIAGNOSIS — M545 Low back pain, unspecified: Secondary | ICD-10-CM | POA: Diagnosis not present

## 2020-08-18 DIAGNOSIS — Z9981 Dependence on supplemental oxygen: Secondary | ICD-10-CM | POA: Diagnosis not present

## 2020-08-18 DIAGNOSIS — F32A Depression, unspecified: Secondary | ICD-10-CM | POA: Diagnosis not present

## 2020-08-18 DIAGNOSIS — K56609 Unspecified intestinal obstruction, unspecified as to partial versus complete obstruction: Secondary | ICD-10-CM | POA: Diagnosis not present

## 2020-08-18 DIAGNOSIS — I5032 Chronic diastolic (congestive) heart failure: Secondary | ICD-10-CM | POA: Diagnosis not present

## 2020-08-18 DIAGNOSIS — E1165 Type 2 diabetes mellitus with hyperglycemia: Secondary | ICD-10-CM | POA: Diagnosis not present

## 2020-08-18 DIAGNOSIS — I313 Pericardial effusion (noninflammatory): Secondary | ICD-10-CM | POA: Diagnosis not present

## 2020-08-18 DIAGNOSIS — E1122 Type 2 diabetes mellitus with diabetic chronic kidney disease: Secondary | ICD-10-CM | POA: Diagnosis not present

## 2020-08-18 DIAGNOSIS — I2510 Atherosclerotic heart disease of native coronary artery without angina pectoris: Secondary | ICD-10-CM | POA: Diagnosis not present

## 2020-08-18 DIAGNOSIS — Z48815 Encounter for surgical aftercare following surgery on the digestive system: Secondary | ICD-10-CM | POA: Diagnosis not present

## 2020-08-18 DIAGNOSIS — Z87891 Personal history of nicotine dependence: Secondary | ICD-10-CM | POA: Diagnosis not present

## 2020-08-18 DIAGNOSIS — E212 Other hyperparathyroidism: Secondary | ICD-10-CM | POA: Diagnosis not present

## 2020-08-18 DIAGNOSIS — I132 Hypertensive heart and chronic kidney disease with heart failure and with stage 5 chronic kidney disease, or end stage renal disease: Secondary | ICD-10-CM | POA: Diagnosis not present

## 2020-08-18 DIAGNOSIS — Z992 Dependence on renal dialysis: Secondary | ICD-10-CM | POA: Diagnosis not present

## 2020-08-18 DIAGNOSIS — K219 Gastro-esophageal reflux disease without esophagitis: Secondary | ICD-10-CM | POA: Diagnosis not present

## 2020-08-19 ENCOUNTER — Other Ambulatory Visit: Payer: Self-pay

## 2020-08-19 ENCOUNTER — Encounter: Payer: Self-pay | Admitting: Surgery

## 2020-08-19 ENCOUNTER — Ambulatory Visit (INDEPENDENT_AMBULATORY_CARE_PROVIDER_SITE_OTHER): Payer: Medicare Other | Admitting: Surgery

## 2020-08-19 VITALS — BP 178/67 | HR 97 | Temp 98.1°F | Ht 63.0 in | Wt 116.0 lb

## 2020-08-19 DIAGNOSIS — L7632 Postprocedural hematoma of skin and subcutaneous tissue following other procedure: Secondary | ICD-10-CM | POA: Diagnosis not present

## 2020-08-19 DIAGNOSIS — T148XXA Other injury of unspecified body region, initial encounter: Secondary | ICD-10-CM | POA: Diagnosis not present

## 2020-08-19 DIAGNOSIS — Z09 Encounter for follow-up examination after completed treatment for conditions other than malignant neoplasm: Secondary | ICD-10-CM

## 2020-08-19 NOTE — Progress Notes (Signed)
08/19/2020  HPI: Lori Stanley is a 87 y.o. female s/p open left femoral incarcerated hernia repair on 07/11/20.  The patient developed a large hematoma in the left groin post-op.  Things had been stable but over the last couple days, the patient has noted bleeding from the wound.  Denies any bright red blood and significant pooling of blood.  Denies any weakness/lightheadedness.  Denies any worsening pain the left groin and denies any new bulging.  Vital signs: BP (!) 178/67   Pulse 97   Temp 98.1 F (36.7 C) (Oral)   Ht '5\' 3"'$  (1.6 m)   Wt 116 lb (52.6 kg)   SpO2 (!) 85%   BMI 20.55 kg/m    Physical Exam: Constitutional:  No acute distress Abdomen:  Soft, non-distended, non-tender to palpation.  The patient's left groin incision has two small openings which are draining the patient's known hematoma.  The drainage is dark blood, in clots.  No erythema, induration, or purulent drainage.  Assessment/Plan: This is a 87 y.o. female s/p open left femoral hernia repair, with subsequent hematoma.  --The patient's hematoma is draining through two openings at the incision.  It appears the pressure from the hematoma caused two spots of wound dehiscence which is allowing the drainage.  There's no active bleeding and no bright red blood, but clots and dark blood only.  I proceeded to explore the lateral opening, probing with qtip, and manually expressing hematoma contents.  The cavity was then irrigated with saline and dried.  Qtip again was used to probe the wound and there were no loculations and no purulent drainage.  The cavity was then packed with 1/2 inch iodoform gauze and dressed with 4x4 gauze and tape.  Dressing change instructions and supplies were given to patient. --No evidence of infection at this point.  Will continue with dressing changes. --Follow up in one week to evaluate the wound.   Melvyn Neth, [facility]

## 2020-08-19 NOTE — Patient Instructions (Addendum)
Replace packing daily and apply a dry dressing until wound is healed. If you have any concerns or questions, please feel free to call our office.      Wound Care, Adult Taking care of your wound properly can help to prevent pain, infection, and scarring. It can also help your wound heal more quickly. Follow instructions from your health care provider about how to care for your wound. Supplies needed:  Soap and water.  Wound cleanser.  Gauze.  If needed, a clean bandage (dressing) or other type of wound dressing material to cover or place in the wound. Follow your health care provider's instructions about what dressing supplies to use.  Cream or ointment to apply to the wound, if told by your health care provider. How to care for your wound Cleaning the wound Ask your health care provider how to clean the wound. This may include:  Using mild soap and water or a wound cleanser.  Using a clean gauze to pat the wound dry after cleaning it. Do not rub or scrub the wound. Dressing care  Wash your hands with soap and water for at least 20 seconds before and after you change the dressing. If soap and water are not available, use hand sanitizer.  Change your dressing as told by your health care provider. This may include: ? Cleaning or rinsing out (irrigating) the wound. ? Placing a dressing over the wound or in the wound (packing). ? Covering the wound with an outer dressing.  Leave any stitches (sutures), skin glue, or adhesive strips in place. These skin closures may need to stay in place for 2 weeks or longer. If adhesive strip edges start to loosen and curl up, you may trim the loose edges. Do not remove adhesive strips completely unless your health care provider tells you to do that.  Ask your health care provider when you can leave the wound uncovered. Checking for infection Check your wound area every day for signs of infection. Check for:  More redness, swelling, or  pain.  Fluid or blood.  Warmth.  Pus or a bad smell.   Follow these instructions at home Medicines  If you were prescribed an antibiotic medicine, cream, or ointment, take or apply it as told by your health care provider. Do not stop using the antibiotic even if your condition improves.  If you were prescribed pain medicine, take it 30 minutes before you do any wound care or as told by your health care provider.  Take over-the-counter and prescription medicines only as told by your health care provider. Eating and drinking  Eat a diet that includes protein, vitamin A, vitamin C, and other nutrient-rich foods to help the wound heal. ? Foods rich in protein include meat, fish, eggs, dairy, beans, and nuts. ? Foods rich in vitamin A include carrots and dark green, leafy vegetables. ? Foods rich in vitamin C include citrus fruits, tomatoes, broccoli, and peppers.  Drink enough fluid to keep your urine pale yellow. General instructions  Do not take baths, swim, use a hot tub, or do anything that would put the wound underwater until your health care provider approves. Ask your health care provider if you may take showers. You may only be allowed to take sponge baths.  Do not scratch or pick at the wound. Keep it covered as told by your health care provider.  Return to your normal activities as told by your health care provider. Ask your health care provider what activities are  safe for you.  Protect your wound from the sun when you are outside for the first 6 months, or for as long as told by your health care provider. Cover up the scar area or apply sunscreen that has an SPF of at least 35.  Do not use any products that contain nicotine or tobacco, such as cigarettes, e-cigarettes, and chewing tobacco. These may delay wound healing. If you need help quitting, ask your health care provider.  Keep all follow-up visits as told by your health care provider. This is important. Contact a  health care provider if:  You received a tetanus shot and you have swelling, severe pain, redness, or bleeding at the injection site.  Your pain is not controlled with medicine.  You have any of these signs of infection: ? More redness, swelling, or pain around the wound. ? Fluid or blood coming from the wound. ? Warmth coming from the wound. ? Pus or a bad smell coming from the wound. ? A fever or chills.  You are nauseous or you vomit.  You are dizzy. Get help right away if:  You have a red streak of skin near the area around your wound.  Your wound has been closed with staples, sutures, skin glue, or adhesive strips and it begins to open up and separate.  Your wound is bleeding, and the bleeding does not stop with gentle pressure.  You have a rash.  You faint.  You have trouble breathing. These symptoms may represent a serious problem that is an emergency. Do not wait to see if the symptoms will go away. Get medical help right away. Call your local emergency services (911 in the [place] Do not drive yourself to the hospital. Summary  Always wash your hands with soap and water for at least 20 seconds before and after changing your dressing.  Change your dressing as told by your health care provider.  To help with healing, eat foods that are rich in protein, vitamin A, vitamin C, and other nutrients.  Check your wound every day for signs of infection. Contact your health care provider if you suspect that your wound is infected. This information is not intended to replace advice given to you by your health care provider. Make sure you discuss any questions you have with your health care provider. Document Revised: 02/01/2019 Document Reviewed: 02/01/2019 Elsevier Patient Education  2021 [place] POST-OPERATIVE PATIENT INSTRUCTIONS   WOUND CARE INSTRUCTIONS:  Keep a dry clean dressing on the wound if there is drainage. The initial bandage may be removed  after 24 hours.  Once the wound has quit draining you may leave it open to air.  If clothing rubs against the wound or causes irritation and the wound is not draining you may cover it with a dry dressing during the daytime.  Try to keep the wound dry and avoid ointments on the wound unless directed to do so.  If the wound becomes bright red and painful or starts to drain infected material that is not clear, please contact your physician immediately.  If the wound is mildly pink and has a thick firm ridge underneath it, this is normal, and is referred to as a healing ridge.  This will resolve over the next 4-6 weeks.  BATHING: You may shower if you have been informed of this by your surgeon. However, Please do not submerge in a tub, hot tub, or pool until incisions are completely sealed or have  been told by your surgeon that you may do so.  DIET:  You may eat any foods that you can tolerate.  It is a good idea to eat a high fiber diet and take in plenty of fluids to prevent constipation.  If you do become constipated you may want to take a mild laxative or take ducolax tablets on a daily basis until your bowel habits are regular.  Constipation can be very uncomfortable, along with straining, after recent surgery.  ACTIVITY:  You are encouraged to cough and deep breath or use your incentive spirometer if you were given one, every 15-30 minutes when awake.  This will help prevent respiratory complications and low grade fevers post-operatively if you had a general anesthetic.  You may want to hug a pillow when coughing and sneezing to add additional support to the surgical area, if you had abdominal or chest surgery, which will decrease pain during these times.  You are encouraged to walk and engage in light activity for the next two weeks.   Soreness after doing exercises or activities of daily living is normal as you get back in to your normal routine.  MEDICATIONS:  Try to take narcotic medications and  anti-inflammatory medications, such as tylenol, ibuprofen, naprosyn, etc., with food.  This will minimize stomach upset from the medication.  Should you develop nausea and vomiting from the pain medication, or develop a rash, please discontinue the medication and contact your physician.  You should not drive, make important decisions, or operate machinery when taking narcotic pain medication.  SUNBLOCK Use sun block to incision area over the next year if this area will be exposed to sun. This helps decrease scarring and will allow you avoid a permanent darkened area over your incision.

## 2020-08-20 DIAGNOSIS — F32A Depression, unspecified: Secondary | ICD-10-CM | POA: Diagnosis not present

## 2020-08-20 DIAGNOSIS — Z87891 Personal history of nicotine dependence: Secondary | ICD-10-CM | POA: Diagnosis not present

## 2020-08-20 DIAGNOSIS — K56609 Unspecified intestinal obstruction, unspecified as to partial versus complete obstruction: Secondary | ICD-10-CM | POA: Diagnosis not present

## 2020-08-20 DIAGNOSIS — E1165 Type 2 diabetes mellitus with hyperglycemia: Secondary | ICD-10-CM | POA: Diagnosis not present

## 2020-08-20 DIAGNOSIS — I252 Old myocardial infarction: Secondary | ICD-10-CM | POA: Diagnosis not present

## 2020-08-20 DIAGNOSIS — E212 Other hyperparathyroidism: Secondary | ICD-10-CM | POA: Diagnosis not present

## 2020-08-20 DIAGNOSIS — D631 Anemia in chronic kidney disease: Secondary | ICD-10-CM | POA: Diagnosis not present

## 2020-08-20 DIAGNOSIS — K219 Gastro-esophageal reflux disease without esophagitis: Secondary | ICD-10-CM | POA: Diagnosis not present

## 2020-08-20 DIAGNOSIS — Z48815 Encounter for surgical aftercare following surgery on the digestive system: Secondary | ICD-10-CM | POA: Diagnosis not present

## 2020-08-20 DIAGNOSIS — E1122 Type 2 diabetes mellitus with diabetic chronic kidney disease: Secondary | ICD-10-CM | POA: Diagnosis not present

## 2020-08-20 DIAGNOSIS — I5032 Chronic diastolic (congestive) heart failure: Secondary | ICD-10-CM | POA: Diagnosis not present

## 2020-08-20 DIAGNOSIS — Z6822 Body mass index (BMI) 22.0-22.9, adult: Secondary | ICD-10-CM | POA: Diagnosis not present

## 2020-08-20 DIAGNOSIS — Z9981 Dependence on supplemental oxygen: Secondary | ICD-10-CM | POA: Diagnosis not present

## 2020-08-20 DIAGNOSIS — I313 Pericardial effusion (noninflammatory): Secondary | ICD-10-CM | POA: Diagnosis not present

## 2020-08-20 DIAGNOSIS — I2510 Atherosclerotic heart disease of native coronary artery without angina pectoris: Secondary | ICD-10-CM | POA: Diagnosis not present

## 2020-08-20 DIAGNOSIS — N186 End stage renal disease: Secondary | ICD-10-CM | POA: Diagnosis not present

## 2020-08-20 DIAGNOSIS — G8929 Other chronic pain: Secondary | ICD-10-CM | POA: Diagnosis not present

## 2020-08-20 DIAGNOSIS — M545 Low back pain, unspecified: Secondary | ICD-10-CM | POA: Diagnosis not present

## 2020-08-20 DIAGNOSIS — Z992 Dependence on renal dialysis: Secondary | ICD-10-CM | POA: Diagnosis not present

## 2020-08-20 DIAGNOSIS — I132 Hypertensive heart and chronic kidney disease with heart failure and with stage 5 chronic kidney disease, or end stage renal disease: Secondary | ICD-10-CM | POA: Diagnosis not present

## 2020-08-21 DIAGNOSIS — Z992 Dependence on renal dialysis: Secondary | ICD-10-CM | POA: Diagnosis not present

## 2020-08-21 DIAGNOSIS — N186 End stage renal disease: Secondary | ICD-10-CM | POA: Diagnosis not present

## 2020-08-24 DIAGNOSIS — D509 Iron deficiency anemia, unspecified: Secondary | ICD-10-CM | POA: Diagnosis not present

## 2020-08-24 DIAGNOSIS — Z992 Dependence on renal dialysis: Secondary | ICD-10-CM | POA: Diagnosis not present

## 2020-08-24 DIAGNOSIS — N186 End stage renal disease: Secondary | ICD-10-CM | POA: Diagnosis not present

## 2020-08-25 ENCOUNTER — Other Ambulatory Visit: Payer: Self-pay

## 2020-08-25 ENCOUNTER — Encounter: Payer: Self-pay | Admitting: Physician Assistant

## 2020-08-25 ENCOUNTER — Ambulatory Visit (INDEPENDENT_AMBULATORY_CARE_PROVIDER_SITE_OTHER): Payer: Medicare Other | Admitting: Physician Assistant

## 2020-08-25 VITALS — BP 136/55 | HR 72 | Temp 98.1°F | Ht 62.0 in | Wt 112.0 lb

## 2020-08-25 DIAGNOSIS — Z09 Encounter for follow-up examination after completed treatment for conditions other than malignant neoplasm: Secondary | ICD-10-CM

## 2020-08-25 DIAGNOSIS — E212 Other hyperparathyroidism: Secondary | ICD-10-CM | POA: Diagnosis not present

## 2020-08-25 DIAGNOSIS — I5032 Chronic diastolic (congestive) heart failure: Secondary | ICD-10-CM | POA: Diagnosis not present

## 2020-08-25 DIAGNOSIS — Z992 Dependence on renal dialysis: Secondary | ICD-10-CM | POA: Diagnosis not present

## 2020-08-25 DIAGNOSIS — Z48815 Encounter for surgical aftercare following surgery on the digestive system: Secondary | ICD-10-CM | POA: Diagnosis not present

## 2020-08-25 DIAGNOSIS — F32A Depression, unspecified: Secondary | ICD-10-CM | POA: Diagnosis not present

## 2020-08-25 DIAGNOSIS — K56609 Unspecified intestinal obstruction, unspecified as to partial versus complete obstruction: Secondary | ICD-10-CM | POA: Diagnosis not present

## 2020-08-25 DIAGNOSIS — I132 Hypertensive heart and chronic kidney disease with heart failure and with stage 5 chronic kidney disease, or end stage renal disease: Secondary | ICD-10-CM | POA: Diagnosis not present

## 2020-08-25 DIAGNOSIS — D631 Anemia in chronic kidney disease: Secondary | ICD-10-CM | POA: Diagnosis not present

## 2020-08-25 DIAGNOSIS — K219 Gastro-esophageal reflux disease without esophagitis: Secondary | ICD-10-CM | POA: Diagnosis not present

## 2020-08-25 DIAGNOSIS — E1165 Type 2 diabetes mellitus with hyperglycemia: Secondary | ICD-10-CM | POA: Diagnosis not present

## 2020-08-25 DIAGNOSIS — M545 Low back pain, unspecified: Secondary | ICD-10-CM | POA: Diagnosis not present

## 2020-08-25 DIAGNOSIS — E1122 Type 2 diabetes mellitus with diabetic chronic kidney disease: Secondary | ICD-10-CM | POA: Diagnosis not present

## 2020-08-25 DIAGNOSIS — I313 Pericardial effusion (noninflammatory): Secondary | ICD-10-CM | POA: Diagnosis not present

## 2020-08-25 DIAGNOSIS — I2510 Atherosclerotic heart disease of native coronary artery without angina pectoris: Secondary | ICD-10-CM | POA: Diagnosis not present

## 2020-08-25 DIAGNOSIS — T148XXA Other injury of unspecified body region, initial encounter: Secondary | ICD-10-CM

## 2020-08-25 DIAGNOSIS — Z6822 Body mass index (BMI) 22.0-22.9, adult: Secondary | ICD-10-CM | POA: Diagnosis not present

## 2020-08-25 DIAGNOSIS — I252 Old myocardial infarction: Secondary | ICD-10-CM | POA: Diagnosis not present

## 2020-08-25 DIAGNOSIS — K4030 Unilateral inguinal hernia, with obstruction, without gangrene, not specified as recurrent: Secondary | ICD-10-CM

## 2020-08-25 DIAGNOSIS — Z9981 Dependence on supplemental oxygen: Secondary | ICD-10-CM | POA: Diagnosis not present

## 2020-08-25 DIAGNOSIS — G8929 Other chronic pain: Secondary | ICD-10-CM | POA: Diagnosis not present

## 2020-08-25 DIAGNOSIS — N186 End stage renal disease: Secondary | ICD-10-CM | POA: Diagnosis not present

## 2020-08-25 DIAGNOSIS — Z87891 Personal history of nicotine dependence: Secondary | ICD-10-CM | POA: Diagnosis not present

## 2020-08-25 NOTE — Patient Instructions (Addendum)
Please continue to wash the wound daily with soap and water and place a dry dressing over the area.   Please call if you have any questions or concerns.

## 2020-08-25 NOTE — Progress Notes (Signed)
[facility] POST-OP OFFICE VISIT  08/25/2020  HPI: Lori Stanley is a 87 y.o. female ~6 weeks s/p open left femoral incarcerated hernia repair which was complicated by hematoma. She was last seen on 04/20 with Dr Hampton Abbot in which the remaining hematoma was manually evacuated. Since that time, she reports that she has done very well. She is having no pain. No longer requiring any packing. Only doing superficial dressings and with minimal serous output. No fever, chills. No other complaints this morning.   Vital signs: BP (!) 136/55   Pulse 72   Temp 98.1 F (36.7 C) (Oral)   Ht '5\' 2"'$  (1.575 m)   Wt 112 lb (50.8 kg)   SpO2 93%   BMI 20.49 kg/m    Physical Exam: Constitutional: Well appearing female, NAD Abdomen: Soft, non-tender, non-distended, no rebound/guarding Skin: Left inguinal incision has healed well, no further opening, she does appear to have minimal serous drainage on band-aid, no further swelling, no erythema  Assessment/Plan: This is a 87 y.o. female ~6 weeks s/p open left femoral incarcerated hernia repair which was complicated by hematoma   - She can continue superficial dressings prn  - Nothing further from surgical perspective  - She can follow up with [place] on as needed basis  -- Edison Simon, PA-C [facility] 08/25/2020, 10:58 AM [phone] M-F: 7am - 4pm

## 2020-08-27 DIAGNOSIS — Z9981 Dependence on supplemental oxygen: Secondary | ICD-10-CM | POA: Diagnosis not present

## 2020-08-27 DIAGNOSIS — M545 Low back pain, unspecified: Secondary | ICD-10-CM | POA: Diagnosis not present

## 2020-08-27 DIAGNOSIS — E212 Other hyperparathyroidism: Secondary | ICD-10-CM | POA: Diagnosis not present

## 2020-08-27 DIAGNOSIS — E1165 Type 2 diabetes mellitus with hyperglycemia: Secondary | ICD-10-CM | POA: Diagnosis not present

## 2020-08-27 DIAGNOSIS — E1122 Type 2 diabetes mellitus with diabetic chronic kidney disease: Secondary | ICD-10-CM | POA: Diagnosis not present

## 2020-08-27 DIAGNOSIS — N186 End stage renal disease: Secondary | ICD-10-CM | POA: Diagnosis not present

## 2020-08-27 DIAGNOSIS — I5032 Chronic diastolic (congestive) heart failure: Secondary | ICD-10-CM | POA: Diagnosis not present

## 2020-08-27 DIAGNOSIS — I132 Hypertensive heart and chronic kidney disease with heart failure and with stage 5 chronic kidney disease, or end stage renal disease: Secondary | ICD-10-CM | POA: Diagnosis not present

## 2020-08-27 DIAGNOSIS — F32A Depression, unspecified: Secondary | ICD-10-CM | POA: Diagnosis not present

## 2020-08-27 DIAGNOSIS — I2510 Atherosclerotic heart disease of native coronary artery without angina pectoris: Secondary | ICD-10-CM | POA: Diagnosis not present

## 2020-08-27 DIAGNOSIS — I313 Pericardial effusion (noninflammatory): Secondary | ICD-10-CM | POA: Diagnosis not present

## 2020-08-27 DIAGNOSIS — Z48815 Encounter for surgical aftercare following surgery on the digestive system: Secondary | ICD-10-CM | POA: Diagnosis not present

## 2020-08-27 DIAGNOSIS — K219 Gastro-esophageal reflux disease without esophagitis: Secondary | ICD-10-CM | POA: Diagnosis not present

## 2020-08-27 DIAGNOSIS — G8929 Other chronic pain: Secondary | ICD-10-CM | POA: Diagnosis not present

## 2020-08-27 DIAGNOSIS — K56609 Unspecified intestinal obstruction, unspecified as to partial versus complete obstruction: Secondary | ICD-10-CM | POA: Diagnosis not present

## 2020-08-27 DIAGNOSIS — I252 Old myocardial infarction: Secondary | ICD-10-CM | POA: Diagnosis not present

## 2020-08-27 DIAGNOSIS — D631 Anemia in chronic kidney disease: Secondary | ICD-10-CM | POA: Diagnosis not present

## 2020-08-27 DIAGNOSIS — Z6822 Body mass index (BMI) 22.0-22.9, adult: Secondary | ICD-10-CM | POA: Diagnosis not present

## 2020-08-27 DIAGNOSIS — Z87891 Personal history of nicotine dependence: Secondary | ICD-10-CM | POA: Diagnosis not present

## 2020-08-27 DIAGNOSIS — Z992 Dependence on renal dialysis: Secondary | ICD-10-CM | POA: Diagnosis not present

## 2020-08-28 DIAGNOSIS — Z992 Dependence on renal dialysis: Secondary | ICD-10-CM | POA: Diagnosis not present

## 2020-08-28 DIAGNOSIS — N186 End stage renal disease: Secondary | ICD-10-CM | POA: Diagnosis not present

## 2020-08-29 DIAGNOSIS — Z992 Dependence on renal dialysis: Secondary | ICD-10-CM | POA: Diagnosis not present

## 2020-08-29 DIAGNOSIS — N186 End stage renal disease: Secondary | ICD-10-CM | POA: Diagnosis not present

## 2020-08-31 DIAGNOSIS — N186 End stage renal disease: Secondary | ICD-10-CM | POA: Diagnosis not present

## 2020-08-31 DIAGNOSIS — Z992 Dependence on renal dialysis: Secondary | ICD-10-CM | POA: Diagnosis not present

## 2020-09-01 DIAGNOSIS — Z48815 Encounter for surgical aftercare following surgery on the digestive system: Secondary | ICD-10-CM | POA: Diagnosis not present

## 2020-09-01 DIAGNOSIS — M545 Low back pain, unspecified: Secondary | ICD-10-CM | POA: Diagnosis not present

## 2020-09-01 DIAGNOSIS — E212 Other hyperparathyroidism: Secondary | ICD-10-CM | POA: Diagnosis not present

## 2020-09-01 DIAGNOSIS — I132 Hypertensive heart and chronic kidney disease with heart failure and with stage 5 chronic kidney disease, or end stage renal disease: Secondary | ICD-10-CM | POA: Diagnosis not present

## 2020-09-01 DIAGNOSIS — I5032 Chronic diastolic (congestive) heart failure: Secondary | ICD-10-CM | POA: Diagnosis not present

## 2020-09-01 DIAGNOSIS — I252 Old myocardial infarction: Secondary | ICD-10-CM | POA: Diagnosis not present

## 2020-09-01 DIAGNOSIS — E1122 Type 2 diabetes mellitus with diabetic chronic kidney disease: Secondary | ICD-10-CM | POA: Diagnosis not present

## 2020-09-01 DIAGNOSIS — Z6822 Body mass index (BMI) 22.0-22.9, adult: Secondary | ICD-10-CM | POA: Diagnosis not present

## 2020-09-01 DIAGNOSIS — K56609 Unspecified intestinal obstruction, unspecified as to partial versus complete obstruction: Secondary | ICD-10-CM | POA: Diagnosis not present

## 2020-09-01 DIAGNOSIS — I313 Pericardial effusion (noninflammatory): Secondary | ICD-10-CM | POA: Diagnosis not present

## 2020-09-01 DIAGNOSIS — Z992 Dependence on renal dialysis: Secondary | ICD-10-CM | POA: Diagnosis not present

## 2020-09-01 DIAGNOSIS — E1165 Type 2 diabetes mellitus with hyperglycemia: Secondary | ICD-10-CM | POA: Diagnosis not present

## 2020-09-01 DIAGNOSIS — D631 Anemia in chronic kidney disease: Secondary | ICD-10-CM | POA: Diagnosis not present

## 2020-09-01 DIAGNOSIS — N186 End stage renal disease: Secondary | ICD-10-CM | POA: Diagnosis not present

## 2020-09-01 DIAGNOSIS — I2510 Atherosclerotic heart disease of native coronary artery without angina pectoris: Secondary | ICD-10-CM | POA: Diagnosis not present

## 2020-09-01 DIAGNOSIS — F32A Depression, unspecified: Secondary | ICD-10-CM | POA: Diagnosis not present

## 2020-09-01 DIAGNOSIS — Z9981 Dependence on supplemental oxygen: Secondary | ICD-10-CM | POA: Diagnosis not present

## 2020-09-01 DIAGNOSIS — K219 Gastro-esophageal reflux disease without esophagitis: Secondary | ICD-10-CM | POA: Diagnosis not present

## 2020-09-01 DIAGNOSIS — G8929 Other chronic pain: Secondary | ICD-10-CM | POA: Diagnosis not present

## 2020-09-01 DIAGNOSIS — Z87891 Personal history of nicotine dependence: Secondary | ICD-10-CM | POA: Diagnosis not present

## 2020-09-04 DIAGNOSIS — N186 End stage renal disease: Secondary | ICD-10-CM | POA: Diagnosis not present

## 2020-09-04 DIAGNOSIS — Z992 Dependence on renal dialysis: Secondary | ICD-10-CM | POA: Diagnosis not present

## 2020-09-07 DIAGNOSIS — N186 End stage renal disease: Secondary | ICD-10-CM | POA: Diagnosis not present

## 2020-09-07 DIAGNOSIS — Z992 Dependence on renal dialysis: Secondary | ICD-10-CM | POA: Diagnosis not present

## 2020-09-07 DIAGNOSIS — N2581 Secondary hyperparathyroidism of renal origin: Secondary | ICD-10-CM | POA: Diagnosis not present

## 2020-09-09 DIAGNOSIS — E1165 Type 2 diabetes mellitus with hyperglycemia: Secondary | ICD-10-CM | POA: Diagnosis not present

## 2020-09-09 DIAGNOSIS — I132 Hypertensive heart and chronic kidney disease with heart failure and with stage 5 chronic kidney disease, or end stage renal disease: Secondary | ICD-10-CM | POA: Diagnosis not present

## 2020-09-09 DIAGNOSIS — D631 Anemia in chronic kidney disease: Secondary | ICD-10-CM | POA: Diagnosis not present

## 2020-09-09 DIAGNOSIS — N186 End stage renal disease: Secondary | ICD-10-CM | POA: Diagnosis not present

## 2020-09-09 DIAGNOSIS — M545 Low back pain, unspecified: Secondary | ICD-10-CM | POA: Diagnosis not present

## 2020-09-09 DIAGNOSIS — Z48815 Encounter for surgical aftercare following surgery on the digestive system: Secondary | ICD-10-CM | POA: Diagnosis not present

## 2020-09-09 DIAGNOSIS — F32A Depression, unspecified: Secondary | ICD-10-CM | POA: Diagnosis not present

## 2020-09-09 DIAGNOSIS — E119 Type 2 diabetes mellitus without complications: Secondary | ICD-10-CM | POA: Diagnosis not present

## 2020-09-09 DIAGNOSIS — B351 Tinea unguium: Secondary | ICD-10-CM | POA: Diagnosis not present

## 2020-09-09 DIAGNOSIS — E1122 Type 2 diabetes mellitus with diabetic chronic kidney disease: Secondary | ICD-10-CM | POA: Diagnosis not present

## 2020-09-09 DIAGNOSIS — Z87891 Personal history of nicotine dependence: Secondary | ICD-10-CM | POA: Diagnosis not present

## 2020-09-09 DIAGNOSIS — K219 Gastro-esophageal reflux disease without esophagitis: Secondary | ICD-10-CM | POA: Diagnosis not present

## 2020-09-09 DIAGNOSIS — Z6822 Body mass index (BMI) 22.0-22.9, adult: Secondary | ICD-10-CM | POA: Diagnosis not present

## 2020-09-09 DIAGNOSIS — Z992 Dependence on renal dialysis: Secondary | ICD-10-CM | POA: Diagnosis not present

## 2020-09-09 DIAGNOSIS — I5032 Chronic diastolic (congestive) heart failure: Secondary | ICD-10-CM | POA: Diagnosis not present

## 2020-09-09 DIAGNOSIS — I313 Pericardial effusion (noninflammatory): Secondary | ICD-10-CM | POA: Diagnosis not present

## 2020-09-09 DIAGNOSIS — E212 Other hyperparathyroidism: Secondary | ICD-10-CM | POA: Diagnosis not present

## 2020-09-09 DIAGNOSIS — Z9981 Dependence on supplemental oxygen: Secondary | ICD-10-CM | POA: Diagnosis not present

## 2020-09-09 DIAGNOSIS — K56609 Unspecified intestinal obstruction, unspecified as to partial versus complete obstruction: Secondary | ICD-10-CM | POA: Diagnosis not present

## 2020-09-09 DIAGNOSIS — G8929 Other chronic pain: Secondary | ICD-10-CM | POA: Diagnosis not present

## 2020-09-09 DIAGNOSIS — I252 Old myocardial infarction: Secondary | ICD-10-CM | POA: Diagnosis not present

## 2020-09-09 DIAGNOSIS — I2510 Atherosclerotic heart disease of native coronary artery without angina pectoris: Secondary | ICD-10-CM | POA: Diagnosis not present

## 2020-09-11 DIAGNOSIS — Z992 Dependence on renal dialysis: Secondary | ICD-10-CM | POA: Diagnosis not present

## 2020-09-11 DIAGNOSIS — N186 End stage renal disease: Secondary | ICD-10-CM | POA: Diagnosis not present

## 2020-09-14 DIAGNOSIS — N186 End stage renal disease: Secondary | ICD-10-CM | POA: Diagnosis not present

## 2020-09-14 DIAGNOSIS — I5022 Chronic systolic (congestive) heart failure: Secondary | ICD-10-CM | POA: Diagnosis not present

## 2020-09-14 DIAGNOSIS — Z992 Dependence on renal dialysis: Secondary | ICD-10-CM | POA: Diagnosis not present

## 2020-09-15 ENCOUNTER — Encounter: Payer: Self-pay | Admitting: Family Medicine

## 2020-09-15 ENCOUNTER — Ambulatory Visit (INDEPENDENT_AMBULATORY_CARE_PROVIDER_SITE_OTHER): Payer: Medicare Other | Admitting: Family Medicine

## 2020-09-15 ENCOUNTER — Other Ambulatory Visit: Payer: Self-pay

## 2020-09-15 VITALS — BP 167/60 | HR 70 | Temp 97.5°F | Resp 18 | Ht 62.0 in | Wt 114.0 lb

## 2020-09-15 DIAGNOSIS — K219 Gastro-esophageal reflux disease without esophagitis: Secondary | ICD-10-CM | POA: Diagnosis not present

## 2020-09-15 DIAGNOSIS — Z0000 Encounter for general adult medical examination without abnormal findings: Secondary | ICD-10-CM | POA: Diagnosis not present

## 2020-09-15 DIAGNOSIS — F32A Depression, unspecified: Secondary | ICD-10-CM | POA: Diagnosis not present

## 2020-09-15 DIAGNOSIS — E212 Other hyperparathyroidism: Secondary | ICD-10-CM | POA: Diagnosis not present

## 2020-09-15 DIAGNOSIS — E1165 Type 2 diabetes mellitus with hyperglycemia: Secondary | ICD-10-CM | POA: Diagnosis not present

## 2020-09-15 DIAGNOSIS — E785 Hyperlipidemia, unspecified: Secondary | ICD-10-CM | POA: Diagnosis not present

## 2020-09-15 DIAGNOSIS — E1122 Type 2 diabetes mellitus with diabetic chronic kidney disease: Secondary | ICD-10-CM | POA: Diagnosis not present

## 2020-09-15 DIAGNOSIS — I10 Essential (primary) hypertension: Secondary | ICD-10-CM | POA: Diagnosis not present

## 2020-09-15 DIAGNOSIS — K56609 Unspecified intestinal obstruction, unspecified as to partial versus complete obstruction: Secondary | ICD-10-CM | POA: Diagnosis not present

## 2020-09-15 DIAGNOSIS — N185 Chronic kidney disease, stage 5: Secondary | ICD-10-CM

## 2020-09-15 DIAGNOSIS — N186 End stage renal disease: Secondary | ICD-10-CM | POA: Diagnosis not present

## 2020-09-15 DIAGNOSIS — D631 Anemia in chronic kidney disease: Secondary | ICD-10-CM | POA: Diagnosis not present

## 2020-09-15 DIAGNOSIS — I313 Pericardial effusion (noninflammatory): Secondary | ICD-10-CM | POA: Diagnosis not present

## 2020-09-15 DIAGNOSIS — I132 Hypertensive heart and chronic kidney disease with heart failure and with stage 5 chronic kidney disease, or end stage renal disease: Secondary | ICD-10-CM | POA: Diagnosis not present

## 2020-09-15 DIAGNOSIS — Z48815 Encounter for surgical aftercare following surgery on the digestive system: Secondary | ICD-10-CM | POA: Diagnosis not present

## 2020-09-15 DIAGNOSIS — Z992 Dependence on renal dialysis: Secondary | ICD-10-CM | POA: Diagnosis not present

## 2020-09-15 DIAGNOSIS — Z9981 Dependence on supplemental oxygen: Secondary | ICD-10-CM | POA: Diagnosis not present

## 2020-09-15 DIAGNOSIS — M545 Low back pain, unspecified: Secondary | ICD-10-CM | POA: Diagnosis not present

## 2020-09-15 DIAGNOSIS — I5032 Chronic diastolic (congestive) heart failure: Secondary | ICD-10-CM

## 2020-09-15 DIAGNOSIS — E1169 Type 2 diabetes mellitus with other specified complication: Secondary | ICD-10-CM | POA: Diagnosis not present

## 2020-09-15 DIAGNOSIS — Z87891 Personal history of nicotine dependence: Secondary | ICD-10-CM | POA: Diagnosis not present

## 2020-09-15 DIAGNOSIS — G8929 Other chronic pain: Secondary | ICD-10-CM | POA: Diagnosis not present

## 2020-09-15 DIAGNOSIS — I252 Old myocardial infarction: Secondary | ICD-10-CM | POA: Diagnosis not present

## 2020-09-15 DIAGNOSIS — I2510 Atherosclerotic heart disease of native coronary artery without angina pectoris: Secondary | ICD-10-CM | POA: Diagnosis not present

## 2020-09-15 DIAGNOSIS — Z6822 Body mass index (BMI) 22.0-22.9, adult: Secondary | ICD-10-CM | POA: Diagnosis not present

## 2020-09-15 NOTE — Progress Notes (Signed)
I,April Miller,acting as a scribe for [name], MD.,have documented all relevant documentation on the behalf of [name], MD,as directed by  [name], MD while in the presence of [name], MD.   Complete physical exam   Patient: Lori Stanley   DOB: 1931/09/24   87 y.o. Female  MRN: [id number] Visit Date: 09/15/2020  Today's healthcare provider: [name], MD   Chief Complaint  Patient presents with  . Annual Exam   Subjective    Lori Stanley is a 87 y.o. female who presents today for a complete physical exam.  She reports consuming a general and low sodium diet. The patient does not participate in regular exercise at present. She generally feels fairly well. She reports sleeping fairly well. She does not have additional problems to discuss today.  HPI  Patient had AWV with [facility] on 05/27/2020.  Past Medical History:  Diagnosis Date  . Acute heart failure ([place])   . Anemia   . Anxiety   . CHF (congestive heart failure) ([place])   . Chronic kidney disease    stage V; end stage renal disease  . Chronic low back pain   . Depression   . Diabetes mellitus without complication ([place])   . GERD (gastroesophageal reflux disease)   . Hyperlipidemia   . Hypertension   . MI (myocardial infarction) ([place])   . Obesity    Past Surgical History:  Procedure Laterality Date  . A/V FISTULAGRAM Left 01/15/2020   Procedure: A/V FISTULAGRAM;  Surgeon: Katha Cabal, MD;  Location: [place] [facility];  Service: Cardiovascular;  Laterality: Left;  . ABDOMINAL HYSTERECTOMY    . APPENDECTOMY    . AV FISTULA PLACEMENT Left 11/13/2019   Procedure: INSERTION OF ARTERIOVENOUS (AV) GORE-TEX GRAFT ARM ( BRACHIAL AXILLARY );  Surgeon: Katha Cabal, MD;  Location: [facility];  Service: Vascular;  Laterality: Left;  . DIALYSIS/PERMA CATHETER INSERTION N/A 06/07/2019   Procedure: DIALYSIS/PERMA CATHETER INSERTION;  Surgeon: Katha Cabal,  MD;  Location: [place] [facility];  Service: Cardiovascular;  Laterality: N/A;  . DIALYSIS/PERMA CATHETER REMOVAL N/A 02/11/2020   Procedure: DIALYSIS/PERMA CATHETER REMOVAL;  Surgeon: Katha Cabal, MD;  Location: [place] [facility];  Service: Cardiovascular;  Laterality: N/A;  . EYE SURGERY    . HERNIA REPAIR    . INCISION AND DRAINAGE PERIRECTAL ABSCESS    . INGUINAL HERNIA REPAIR N/A 07/12/2020   Procedure: HERNIA REPAIR INGUINAL ADULT;  Surgeon: Olean Ree, MD;  Location: [facility];  Service: General;  Laterality: N/A;  . KNEE SURGERY    . THYROID SURGERY    . VAGINAL HYSTERECTOMY     Social History   Socioeconomic History  . Marital status: Widowed    Spouse name: Not on file  . Number of children: 1  . Years of education: Not on file  . Highest education level: 11th grade  Occupational History  . Occupation: retired  Tobacco Use  . Smoking status: Former Smoker    Types: Cigarettes  . Smokeless tobacco: Never Used  . Tobacco comment: Quit in 2015-2016  Vaping Use  . Vaping Use: Never used  Substance and Sexual Activity  . Alcohol use: No  . Drug use: No  . Sexual activity: Never  Other Topics Concern  . Not on file  Social History Narrative   Lives in [place]; self; friends do chores; quit smoking many years; no alcohol. Worked in [employer].  Social Determinants of Health   Financial Resource Strain: Low Risk   . Difficulty of Paying Living Expenses: Not hard at all  Food Insecurity: No Food Insecurity  . Worried About Charity fundraiser in the Last Year: Never true  . Ran Out of Food in the Last Year: Never true  Transportation Needs: No Transportation Needs  . Lack of Transportation (Medical): No  . Lack of Transportation (Non-Medical): No  Physical Activity: Inactive  . Days of Exercise per Week: 0 days  . Minutes of Exercise per Session: 0 min  Stress: No Stress Concern Present  . Feeling of Stress : Not at all  Social Connections:  Socially Isolated  . Frequency of Communication with Friends and Family: More than three times a week  . Frequency of Social Gatherings with Friends and Family: Three times a week  . Attends Religious Services: Never  . Active Member of Clubs or Organizations: No  . Attends Archivist Meetings: Never  . Marital Status: Widowed  Intimate Partner Violence: Not At Risk  . Fear of Current or Ex-Partner: No  . Emotionally Abused: No  . Physically Abused: No  . Sexually Abused: No   Family Status  Relation Name Status  . Mother  Deceased  . Father  Deceased  . Son  Deceased       Died from blood clot during sx.  . Sister  Deceased  . Brother  Alive  . Sister  Alive  . Sister  Alive  . Sister  Deceased   Family History  Problem Relation Age of Onset  . Heart attack Mother   . Heart disease Sister   . Cancer Sister    Allergies  Allergen Reactions  . Antihistamines, Chlorpheniramine-Type Other (See Comments)    Unknown reaction.  [name] Paxil [Paroxetine]     "makes her feel funny" not in a good way  . Codeine Rash and Other (See Comments)    Mouth sore    Patient Care Team: Jerrol Banana., MD as PCP - General (Family Medicine) Wellington Hampshire, MD as PCP - Cardiology (Cardiology) Sharlotte Alamo, DPM (Podiatry) Hayden Pedro, MD as Consulting Physician (Ophthalmology) Lavonia Dana, MD as Consulting Physician (Internal Medicine) Odette Fraction (Optometry) Cammie Sickle, MD as Consulting Physician (Internal Medicine) Neldon Labella, RN as Case Manager   Medications: Outpatient Medications Prior to Visit  Medication Sig  . acetaminophen (TYLENOL) 500 MG tablet Take 500-1,000 mg by mouth every 6 (six) hours as needed for mild pain or fever.   [name] albuterol (VENTOLIN HFA) 108 (90 Base) MCG/ACT inhaler TAKE 2 PUFFS BY MOUTH EVERY 6 HOURS AS NEEDED FOR WHEEZE OR SHORTNESS OF BREATH (Patient taking differently: Inhale 2 puffs into the lungs every 6  (six) hours as needed for wheezing or shortness of breath.)  . amLODipine (NORVASC) 5 MG tablet Take 1 tablet (5 mg total) by mouth daily.  . Bepotastine Besilate 1.5 % SOLN Place 1 drop into both eyes in the morning and at bedtime. Morning & afternoon.  . brimonidine (ALPHAGAN) 0.2 % ophthalmic solution Place 1 drop into both eyes 2 (two) times daily.   . calcitRIOL (ROCALTROL) 0.25 MCG capsule Take 0.25 mcg by mouth every Monday, Wednesday, and Friday.   . carvedilol (COREG) 12.5 MG tablet TAKE 1 TABLET (12.5 MG TOTAL) BY MOUTH 2 (TWO) TIMES DAILY.  . CVS ASPIRIN ADULT LOW DOSE 81 MG chewable tablet CHEW 1 TABLET (81 MG TOTAL) BY  MOUTH DAILY.  [name] dorzolamide-timolol (COSOPT) 22.3-6.8 MG/ML ophthalmic solution Place 1 drop into both eyes 2 (two) times daily.   [name] latanoprost (XALATAN) 0.005 % ophthalmic solution Place 1 drop into both eyes at bedtime.  [name] loratadine (CLARITIN) 10 MG tablet Take 1 tablet (10 mg total) by mouth daily.  . multivitamin (RENA-VIT) TABS tablet Take 1 tablet by mouth daily.   . OXYGEN Inhale 2 L into the lungs at bedtime.  . sertraline (ZOLOFT) 25 MG tablet TAKE 1 TABLET BY MOUTH EVERY DAY (Patient taking differently: Take 25 mg by mouth daily.)  . torsemide (DEMADEX) 100 MG tablet Take 100 mg by mouth daily.  . traZODone (DESYREL) 150 MG tablet TAKE 1 TABLET (150 MG TOTAL) BY MOUTH AT BEDTIME. (Patient taking differently: Take 150 mg by mouth at bedtime.)   No facility-administered medications prior to visit.    Review of Systems  HENT: Positive for sneezing.   Eyes: Positive for itching.  Musculoskeletal: Positive for arthralgias.  Allergic/Immunologic: Positive for food allergies.  All other systems reviewed and are negative.      Objective    BP (!) 167/60 (BP Location: Right Arm, Patient Position: Sitting, Cuff Size: Small)   Pulse 70   Temp (!) 97.5 F (36.4 C) (Oral)   Resp 18   Ht '5\' 2"'$  (1.575 m)   Wt 114 lb (51.7 kg)   SpO2 96%   BMI 20.85 kg/m   BP Readings from Last 3 Encounters:  09/15/20 (!) 167/60  08/25/20 (!) 136/55  08/19/20 (!) 178/67   Wt Readings from Last 3 Encounters:  09/15/20 114 lb (51.7 kg)  08/25/20 112 lb (50.8 kg)  08/19/20 116 lb (52.6 kg)      Physical Exam Vitals reviewed.  Constitutional:      Appearance: She is well-developed.  HENT:     Head: Normocephalic and atraumatic.     Right Ear: External ear normal.     Left Ear: External ear normal.     Nose: Nose normal.  Eyes:     General: No scleral icterus.    Conjunctiva/sclera: Conjunctivae normal.  Neck:     Thyroid: No thyromegaly.     Vascular: No JVD.  Cardiovascular:     Rate and Rhythm: Normal rate and regular rhythm.     Heart sounds: Normal heart sounds.  Pulmonary:     Effort: Pulmonary effort is normal.     Breath sounds: Normal breath sounds.  Abdominal:     Palpations: Abdomen is soft.  Musculoskeletal:     Comments: Trace edema.  Lymphadenopathy:     Cervical: No cervical adenopathy.  Skin:    General: Skin is warm and dry.     Comments: Hyper pigmented, hyperkeratotic skin around both lateral ankles.  Neurological:     Mental Status: She is oriented to person, place, and time.  Psychiatric:        Behavior: Behavior normal.        Thought Content: Thought content normal.        Judgment: Judgment normal.       Last depression screening scores PHQ 2/9 Scores 05/20/2020 12/26/2019 05/23/2019  PHQ - 2 Score 4 0 1  PHQ- 9 Score 4 - -   Last fall risk screening Fall Risk  08/25/2020  Falls in the past year? 0  Number falls in past yr: -  Injury with Fall? -  Risk for fall due to : -  Follow up -   Last  Audit-C alcohol use screening Alcohol Use Disorder Test (AUDIT) 05/27/2020  1. How often do you have a drink containing alcohol? 0  2. How many drinks containing alcohol do you have on a typical day when you are drinking? 0  3. How often do you have six or more drinks on one occasion? 0  AUDIT-C Score 0   Alcohol Brief Interventions/Follow-up AUDIT Score <7 follow-up not indicated   A score of 3 or more in women, and 4 or more in men indicates increased risk for alcohol abuse, EXCEPT if all of the points are from question 1   No results found for any visits on 09/15/20.  Assessment & Plan    Routine Health Maintenance and Physical Exam  Exercise Activities and Dietary recommendations Goals    .  Chronic Care Management      CARE PLAN ENTRY (see longitudinal plan of care for additional care plan information)  Current Barriers:  . Chronic Disease Management support and education needs related HTN, CHF, DM, HLD and ESRD.  Case Manager Clinical Goal(s):  Over the next 120 days, patient will: . Not require hospitalization or emergent care d/t complications r/t chronic illnesses. . Take all medications as prescribed. . Attend all medical appointments as scheduled. . Monitor blood pressure and record readings. . Follow recommended safety measures to prevent fall and injuries. Over the next 45 days, patient will: . Schedule routine eye exam. . Schedule routine podiatry visit.    Interventions:  . Inter-disciplinary care team collaboration (see longitudinal plan of care) . Discussed recent BP readings. Unable to recall exact readings. Reports occasional low readings following dialysis but says most readings have been within range.  . Reviewed s/sx of CHF related complications and discussed current activity tolerance. She continues to use supplemental oxygen at 2L/min during the night. Denies episodes of shortness of breath during the day. Denies chest discomfort, abdominal or lower extremity edema. Denies change/decline in activity tolerance.  . Discussed plan for care management follow-up. Ms. Twomey reports doing well and remains independent in the home. She required a new arteriovenous graft. The procedure was performed on 11/13/19. Reports episodes of shoulder pain but denies  unrelieved pain or worsening symptoms that require immediate medical follow-up. Reports wearing her arm sleeve as advised. We thoroughly discussed her nutrition and importance of protein intake. She feels that she is doing well with maintaining a renal diet. She does recall days when her appetite is very limited d/t fatigue following dialysis. We discussed possible need to supplement with Nepro on these days vice skipping afternoon and evening meals. She agreed to discuss with her Nephrologist. Currently feels that she is doing well and denies urgent concerns or changes with care management needs.    Patient Self Care Activities:  . Self administers medications  . Attends scheduled provider appointments . Calls pharmacy for medication refills . Performs ADL's independently . Performs IADL's independently   Please see past updates related to this goal by clicking on the "Past Updates" button in the selected goal      .  DIET - INCREASE WATER INTAKE      Recommend to drink at least 6-8 8oz glasses of water per day.    .  Expensive eye drops (pt-stated)      Current Barriers:  . financial  Pharmacist Clinical Goal(s): Over the next 14 days, Ms.Oswaldo Milian will provide the necessary supplementary documents (proof of out of pocket prescription expenditure, proof of household income) needed for medication  assistance applications to CCM pharmacist.   Interventions: . CCM pharmacist will investigate available programs or foundations for Ms. Shambley's eye drops  Patient Self Care Activities:  [name] Gather necessary documents needed to apply for medication assistance  Initial goal documentation      .  Pill Burden (pt-stated)       Current Barriers:  . High pill burden . Limited vision abilities  Pharmacist Clinical Goal(s): Over the next 30 days, Ms. Ostenson will be adherent to all medications as evidenced by patient report. CCM clinical pharmacist will collaborate with prescribers to  reduce medications if medically appropriate.   Interventions: . Patient educated on purpose, proper use and potential adverse effects of medications. Minette Brine Dr. [place]ton [place] re: Ergocalciferol and labs . Develop plan with Dr. [name] for coming off of insulin . DC ASA '81mg'$   . Make sure patient can read and visualize medications appropriately  Patient Self Care Activities:  . Continue to take all medications as prescribed  Initial goal documentation         Immunization History  Administered Date(s) Administered  . Fluad Quad(high Dose 65+) 01/29/2019, 01/14/2020  . Hepatitis B, adult 12/27/2019, 03/30/2020, 04/03/2020  . Influenza, High Dose Seasonal PF 02/05/2015, 01/16/2016, 12/26/2016, 02/13/2018  . PFIZER(Purple Top)SARS-COV-2 Vaccination 08/07/2019, 08/28/2019, 05/20/2020  . PPD Test 11/29/2019  . Pneumococcal Conjugate-13 02/04/2014  . Pneumococcal Polysaccharide-23 04/07/2002  . Td 10/19/2016    Health Maintenance  Topic Date Due  . URINE MICROALBUMIN  Never done  . OPHTHALMOLOGY EXAM  10/04/2018  . INFLUENZA VACCINE  11/30/2020  . HEMOGLOBIN A1C  01/12/2021  . FOOT EXAM  01/13/2021  . TETANUS/TDAP  10/20/2026  . DEXA SCAN  Completed  . COVID-19 Vaccine  Completed  . PNA vac Low Risk Adult  Completed  . HPV VACCINES  Aged Out    Discussed health benefits of physical activity, and encouraged her to engage in regular exercise appropriate for her age and condition.  1. Annual physical exam  - Lipid panel - TSH - CBC w/Diff/Platelet - Comprehensive Metabolic Panel (CMET)  2. Essential hypertension  - Lipid panel - TSH - CBC w/Diff/Platelet - Comprehensive Metabolic Panel (CMET)  3. Type 2 diabetes mellitus with hyperlipidemia (HCC)  - Lipid panel - TSH - CBC w/Diff/Platelet - Comprehensive Metabolic Panel (CMET)  4. Hyperlipidemia, unspecified hyperlipidemia type  - Lipid panel - TSH - CBC w/Diff/Platelet - Comprehensive Metabolic Panel  (CMET)  5. Chronic diastolic CHF (congestive heart failure) (HCC)  - Lipid panel - TSH - CBC w/Diff/Platelet - Comprehensive Metabolic Panel (CMET)  6. Chronic kidney disease (CKD), stage V ([place]) On hemodialysis. - Lipid panel - TSH - CBC w/Diff/Platelet - Comprehensive Metabolic Panel (CMET)   Return in about 3 months (around 12/16/2020).        Marilyne Haseley Cranford Mon, MD  [facility] [phone] (phone) [phone] (fax)  [place]

## 2020-09-15 NOTE — Patient Instructions (Signed)
Try small Neoprene knee sleeve.

## 2020-09-16 LAB — COMPREHENSIVE METABOLIC PANEL
ALT: 11 IU/L (ref 0–32)
AST: 15 IU/L (ref 0–40)
Albumin/Globulin Ratio: 1.6 (ref 1.2–2.2)
Albumin: 4 g/dL (ref 3.6–4.6)
Alkaline Phosphatase: 110 IU/L (ref 44–121)
BUN/Creatinine Ratio: 7 — ABNORMAL LOW (ref 12–28)
BUN: 17 mg/dL (ref 8–27)
Bilirubin Total: 0.3 mg/dL (ref 0.0–1.2)
CO2: 26 mmol/L (ref 20–29)
Calcium: 10.1 mg/dL (ref 8.7–10.3)
Chloride: 101 mmol/L (ref 96–106)
Creatinine, Ser: 2.37 mg/dL — ABNORMAL HIGH (ref 0.57–1.00)
Globulin, Total: 2.5 g/dL (ref 1.5–4.5)
Glucose: 206 mg/dL — ABNORMAL HIGH (ref 65–99)
Potassium: 4.4 mmol/L (ref 3.5–5.2)
Sodium: 143 mmol/L (ref 134–144)
Total Protein: 6.5 g/dL (ref 6.0–8.5)
eGFR: 19 mL/min/{1.73_m2} — ABNORMAL LOW (ref >59)

## 2020-09-16 LAB — CBC WITH DIFFERENTIAL/PLATELET
Basophils Absolute: 0 10*3/uL (ref 0.0–0.2)
Basos: 0 %
EOS (ABSOLUTE): 0.1 10*3/uL (ref 0.0–0.4)
Eos: 2 %
Hematocrit: 34.8 % (ref 34.0–46.6)
Hemoglobin: 11.1 g/dL (ref 11.1–15.9)
Immature Grans (Abs): 0 10*3/uL (ref 0.0–0.1)
Immature Granulocytes: 0 %
Lymphocytes Absolute: 1 10*3/uL (ref 0.7–3.1)
Lymphs: 22 %
MCH: 29.5 pg (ref 26.6–33.0)
MCHC: 31.9 g/dL (ref 31.5–35.7)
MCV: 93 fL (ref 79–97)
Monocytes Absolute: 0.4 10*3/uL (ref 0.1–0.9)
Monocytes: 8 %
Neutrophils Absolute: 3.2 10*3/uL (ref 1.4–7.0)
Neutrophils: 68 %
Platelets: 261 10*3/uL (ref 150–450)
RBC: 3.76 x10E6/uL — ABNORMAL LOW (ref 3.77–5.28)
RDW: 13.9 % (ref 11.7–15.4)
WBC: 4.7 10*3/uL (ref 3.4–10.8)

## 2020-09-16 LAB — LIPID PANEL
Chol/HDL Ratio: 2.7 ratio (ref 0.0–4.4)
Cholesterol, Total: 205 mg/dL — ABNORMAL HIGH (ref 100–199)
HDL: 76 mg/dL (ref >39)
LDL Chol Calc (NIH): 115 mg/dL — ABNORMAL HIGH (ref 0–99)
Triglycerides: 80 mg/dL (ref 0–149)
VLDL Cholesterol Cal: 14 mg/dL (ref 5–40)

## 2020-09-16 LAB — TSH: TSH: 2.07 u[IU]/mL (ref 0.450–4.500)

## 2020-09-17 DIAGNOSIS — E1165 Type 2 diabetes mellitus with hyperglycemia: Secondary | ICD-10-CM | POA: Diagnosis not present

## 2020-09-17 DIAGNOSIS — I313 Pericardial effusion (noninflammatory): Secondary | ICD-10-CM | POA: Diagnosis not present

## 2020-09-17 DIAGNOSIS — E212 Other hyperparathyroidism: Secondary | ICD-10-CM | POA: Diagnosis not present

## 2020-09-17 DIAGNOSIS — F32A Depression, unspecified: Secondary | ICD-10-CM | POA: Diagnosis not present

## 2020-09-17 DIAGNOSIS — K219 Gastro-esophageal reflux disease without esophagitis: Secondary | ICD-10-CM | POA: Diagnosis not present

## 2020-09-17 DIAGNOSIS — Z9981 Dependence on supplemental oxygen: Secondary | ICD-10-CM | POA: Diagnosis not present

## 2020-09-17 DIAGNOSIS — G8929 Other chronic pain: Secondary | ICD-10-CM | POA: Diagnosis not present

## 2020-09-17 DIAGNOSIS — D631 Anemia in chronic kidney disease: Secondary | ICD-10-CM | POA: Diagnosis not present

## 2020-09-17 DIAGNOSIS — I5032 Chronic diastolic (congestive) heart failure: Secondary | ICD-10-CM | POA: Diagnosis not present

## 2020-09-17 DIAGNOSIS — Z48815 Encounter for surgical aftercare following surgery on the digestive system: Secondary | ICD-10-CM | POA: Diagnosis not present

## 2020-09-17 DIAGNOSIS — K56609 Unspecified intestinal obstruction, unspecified as to partial versus complete obstruction: Secondary | ICD-10-CM | POA: Diagnosis not present

## 2020-09-17 DIAGNOSIS — I252 Old myocardial infarction: Secondary | ICD-10-CM | POA: Diagnosis not present

## 2020-09-17 DIAGNOSIS — Z992 Dependence on renal dialysis: Secondary | ICD-10-CM | POA: Diagnosis not present

## 2020-09-17 DIAGNOSIS — Z6822 Body mass index (BMI) 22.0-22.9, adult: Secondary | ICD-10-CM | POA: Diagnosis not present

## 2020-09-17 DIAGNOSIS — I132 Hypertensive heart and chronic kidney disease with heart failure and with stage 5 chronic kidney disease, or end stage renal disease: Secondary | ICD-10-CM | POA: Diagnosis not present

## 2020-09-17 DIAGNOSIS — I2510 Atherosclerotic heart disease of native coronary artery without angina pectoris: Secondary | ICD-10-CM | POA: Diagnosis not present

## 2020-09-17 DIAGNOSIS — M545 Low back pain, unspecified: Secondary | ICD-10-CM | POA: Diagnosis not present

## 2020-09-17 DIAGNOSIS — E1122 Type 2 diabetes mellitus with diabetic chronic kidney disease: Secondary | ICD-10-CM | POA: Diagnosis not present

## 2020-09-17 DIAGNOSIS — N186 End stage renal disease: Secondary | ICD-10-CM | POA: Diagnosis not present

## 2020-09-17 DIAGNOSIS — Z87891 Personal history of nicotine dependence: Secondary | ICD-10-CM | POA: Diagnosis not present

## 2020-09-18 DIAGNOSIS — N186 End stage renal disease: Secondary | ICD-10-CM | POA: Diagnosis not present

## 2020-09-18 DIAGNOSIS — Z992 Dependence on renal dialysis: Secondary | ICD-10-CM | POA: Diagnosis not present

## 2020-09-21 DIAGNOSIS — Z992 Dependence on renal dialysis: Secondary | ICD-10-CM | POA: Diagnosis not present

## 2020-09-21 DIAGNOSIS — N186 End stage renal disease: Secondary | ICD-10-CM | POA: Diagnosis not present

## 2020-09-22 DIAGNOSIS — E1122 Type 2 diabetes mellitus with diabetic chronic kidney disease: Secondary | ICD-10-CM | POA: Diagnosis not present

## 2020-09-22 DIAGNOSIS — Z48815 Encounter for surgical aftercare following surgery on the digestive system: Secondary | ICD-10-CM | POA: Diagnosis not present

## 2020-09-22 DIAGNOSIS — I5032 Chronic diastolic (congestive) heart failure: Secondary | ICD-10-CM | POA: Diagnosis not present

## 2020-09-22 DIAGNOSIS — I132 Hypertensive heart and chronic kidney disease with heart failure and with stage 5 chronic kidney disease, or end stage renal disease: Secondary | ICD-10-CM | POA: Diagnosis not present

## 2020-09-22 DIAGNOSIS — Z6822 Body mass index (BMI) 22.0-22.9, adult: Secondary | ICD-10-CM | POA: Diagnosis not present

## 2020-09-22 DIAGNOSIS — K219 Gastro-esophageal reflux disease without esophagitis: Secondary | ICD-10-CM | POA: Diagnosis not present

## 2020-09-22 DIAGNOSIS — Z992 Dependence on renal dialysis: Secondary | ICD-10-CM | POA: Diagnosis not present

## 2020-09-22 DIAGNOSIS — K56609 Unspecified intestinal obstruction, unspecified as to partial versus complete obstruction: Secondary | ICD-10-CM | POA: Diagnosis not present

## 2020-09-22 DIAGNOSIS — N186 End stage renal disease: Secondary | ICD-10-CM | POA: Diagnosis not present

## 2020-09-22 DIAGNOSIS — M545 Low back pain, unspecified: Secondary | ICD-10-CM | POA: Diagnosis not present

## 2020-09-22 DIAGNOSIS — Z87891 Personal history of nicotine dependence: Secondary | ICD-10-CM | POA: Diagnosis not present

## 2020-09-22 DIAGNOSIS — E212 Other hyperparathyroidism: Secondary | ICD-10-CM | POA: Diagnosis not present

## 2020-09-22 DIAGNOSIS — I2510 Atherosclerotic heart disease of native coronary artery without angina pectoris: Secondary | ICD-10-CM | POA: Diagnosis not present

## 2020-09-22 DIAGNOSIS — I252 Old myocardial infarction: Secondary | ICD-10-CM | POA: Diagnosis not present

## 2020-09-22 DIAGNOSIS — Z9981 Dependence on supplemental oxygen: Secondary | ICD-10-CM | POA: Diagnosis not present

## 2020-09-22 DIAGNOSIS — D631 Anemia in chronic kidney disease: Secondary | ICD-10-CM | POA: Diagnosis not present

## 2020-09-22 DIAGNOSIS — E1165 Type 2 diabetes mellitus with hyperglycemia: Secondary | ICD-10-CM | POA: Diagnosis not present

## 2020-09-22 DIAGNOSIS — G8929 Other chronic pain: Secondary | ICD-10-CM | POA: Diagnosis not present

## 2020-09-22 DIAGNOSIS — F32A Depression, unspecified: Secondary | ICD-10-CM | POA: Diagnosis not present

## 2020-09-22 DIAGNOSIS — I313 Pericardial effusion (noninflammatory): Secondary | ICD-10-CM | POA: Diagnosis not present

## 2020-09-25 DIAGNOSIS — N186 End stage renal disease: Secondary | ICD-10-CM | POA: Diagnosis not present

## 2020-09-25 DIAGNOSIS — Z992 Dependence on renal dialysis: Secondary | ICD-10-CM | POA: Diagnosis not present

## 2020-09-29 DIAGNOSIS — N186 End stage renal disease: Secondary | ICD-10-CM | POA: Diagnosis not present

## 2020-09-29 DIAGNOSIS — Z992 Dependence on renal dialysis: Secondary | ICD-10-CM | POA: Diagnosis not present

## 2020-09-30 DIAGNOSIS — Z992 Dependence on renal dialysis: Secondary | ICD-10-CM | POA: Diagnosis not present

## 2020-09-30 DIAGNOSIS — N186 End stage renal disease: Secondary | ICD-10-CM | POA: Diagnosis not present

## 2020-10-01 DIAGNOSIS — E1122 Type 2 diabetes mellitus with diabetic chronic kidney disease: Secondary | ICD-10-CM | POA: Diagnosis not present

## 2020-10-01 DIAGNOSIS — I313 Pericardial effusion (noninflammatory): Secondary | ICD-10-CM | POA: Diagnosis not present

## 2020-10-01 DIAGNOSIS — Z87891 Personal history of nicotine dependence: Secondary | ICD-10-CM | POA: Diagnosis not present

## 2020-10-01 DIAGNOSIS — Z48815 Encounter for surgical aftercare following surgery on the digestive system: Secondary | ICD-10-CM | POA: Diagnosis not present

## 2020-10-01 DIAGNOSIS — K219 Gastro-esophageal reflux disease without esophagitis: Secondary | ICD-10-CM | POA: Diagnosis not present

## 2020-10-01 DIAGNOSIS — I252 Old myocardial infarction: Secondary | ICD-10-CM | POA: Diagnosis not present

## 2020-10-01 DIAGNOSIS — E212 Other hyperparathyroidism: Secondary | ICD-10-CM | POA: Diagnosis not present

## 2020-10-01 DIAGNOSIS — Z9981 Dependence on supplemental oxygen: Secondary | ICD-10-CM | POA: Diagnosis not present

## 2020-10-01 DIAGNOSIS — I132 Hypertensive heart and chronic kidney disease with heart failure and with stage 5 chronic kidney disease, or end stage renal disease: Secondary | ICD-10-CM | POA: Diagnosis not present

## 2020-10-01 DIAGNOSIS — I2510 Atherosclerotic heart disease of native coronary artery without angina pectoris: Secondary | ICD-10-CM | POA: Diagnosis not present

## 2020-10-01 DIAGNOSIS — F32A Depression, unspecified: Secondary | ICD-10-CM | POA: Diagnosis not present

## 2020-10-01 DIAGNOSIS — K56609 Unspecified intestinal obstruction, unspecified as to partial versus complete obstruction: Secondary | ICD-10-CM | POA: Diagnosis not present

## 2020-10-01 DIAGNOSIS — Z6822 Body mass index (BMI) 22.0-22.9, adult: Secondary | ICD-10-CM | POA: Diagnosis not present

## 2020-10-01 DIAGNOSIS — G8929 Other chronic pain: Secondary | ICD-10-CM | POA: Diagnosis not present

## 2020-10-01 DIAGNOSIS — D631 Anemia in chronic kidney disease: Secondary | ICD-10-CM | POA: Diagnosis not present

## 2020-10-01 DIAGNOSIS — M545 Low back pain, unspecified: Secondary | ICD-10-CM | POA: Diagnosis not present

## 2020-10-01 DIAGNOSIS — Z992 Dependence on renal dialysis: Secondary | ICD-10-CM | POA: Diagnosis not present

## 2020-10-01 DIAGNOSIS — E1165 Type 2 diabetes mellitus with hyperglycemia: Secondary | ICD-10-CM | POA: Diagnosis not present

## 2020-10-01 DIAGNOSIS — N186 End stage renal disease: Secondary | ICD-10-CM | POA: Diagnosis not present

## 2020-10-01 DIAGNOSIS — I5032 Chronic diastolic (congestive) heart failure: Secondary | ICD-10-CM | POA: Diagnosis not present

## 2020-10-02 DIAGNOSIS — Z992 Dependence on renal dialysis: Secondary | ICD-10-CM | POA: Diagnosis not present

## 2020-10-02 DIAGNOSIS — N186 End stage renal disease: Secondary | ICD-10-CM | POA: Diagnosis not present

## 2020-10-05 ENCOUNTER — Other Ambulatory Visit: Payer: Self-pay | Admitting: Family Medicine

## 2020-10-05 DIAGNOSIS — Z992 Dependence on renal dialysis: Secondary | ICD-10-CM | POA: Diagnosis not present

## 2020-10-05 DIAGNOSIS — N186 End stage renal disease: Secondary | ICD-10-CM | POA: Diagnosis not present

## 2020-10-05 DIAGNOSIS — F329 Major depressive disorder, single episode, unspecified: Secondary | ICD-10-CM

## 2020-10-09 DIAGNOSIS — Z992 Dependence on renal dialysis: Secondary | ICD-10-CM | POA: Diagnosis not present

## 2020-10-09 DIAGNOSIS — N186 End stage renal disease: Secondary | ICD-10-CM | POA: Diagnosis not present

## 2020-10-12 ENCOUNTER — Other Ambulatory Visit: Payer: Self-pay | Admitting: Family Medicine

## 2020-10-12 DIAGNOSIS — Z992 Dependence on renal dialysis: Secondary | ICD-10-CM | POA: Diagnosis not present

## 2020-10-12 DIAGNOSIS — N186 End stage renal disease: Secondary | ICD-10-CM | POA: Diagnosis not present

## 2020-10-12 DIAGNOSIS — I10 Essential (primary) hypertension: Secondary | ICD-10-CM

## 2020-10-12 DIAGNOSIS — N2581 Secondary hyperparathyroidism of renal origin: Secondary | ICD-10-CM | POA: Diagnosis not present

## 2020-10-12 DIAGNOSIS — F329 Major depressive disorder, single episode, unspecified: Secondary | ICD-10-CM

## 2020-10-12 NOTE — Telephone Encounter (Signed)
[facility] called and spoke to [facility], Merchant [employer] officer about the refill(s) Sertraline requested. Advised it was sent on 10/05/20 #90/2 refill(s). She says they have that one one hold and she will release it to refill for the patient. I asked about Telmisartan because it's no longer on her profile. She says the last it was refilled was in December, 2021 and she should be out as of March. She says the patient gets confused with her medications sometimes. I advised I will call the patient. Patient called and advised Sertraline will be refilled, advised Telmisartan was discontinued when she was in the hospital in March and the only BP medications she should be taking are Amlodipine and Carvedilol. Patient verbalized understanding.

## 2020-10-12 NOTE — Telephone Encounter (Signed)
Copied from [place] [id number]. Topic: Quick Communication - Rx Refill/Question >> Oct 12, 2020  4:34 PM Mcneil, Ja-Kwan wrote: Medication: sertraline (ZOLOFT) 25 MG tablet and telmisartan (MICARDIS) 20 MG tablet  Has the patient contacted their pharmacy? Yes.  Pt told to call the office (Agent: If no, request that the patient contact the pharmacy for the refill.) (Agent: If yes, when and what did the pharmacy advise?)  Preferred Pharmacy (with phone number or street name): [id number] [address]  Phone: [phone] Fax: [phone] Agent: Please be advised that RX refills may take up to 3 business days. We ask that you follow-up with your pharmacy.

## 2020-10-15 ENCOUNTER — Encounter: Payer: Self-pay | Admitting: Internal Medicine

## 2020-10-15 DIAGNOSIS — I5022 Chronic systolic (congestive) heart failure: Secondary | ICD-10-CM | POA: Diagnosis not present

## 2020-10-16 DIAGNOSIS — N186 End stage renal disease: Secondary | ICD-10-CM | POA: Diagnosis not present

## 2020-10-16 DIAGNOSIS — Z992 Dependence on renal dialysis: Secondary | ICD-10-CM | POA: Diagnosis not present

## 2020-10-19 DIAGNOSIS — Z992 Dependence on renal dialysis: Secondary | ICD-10-CM | POA: Diagnosis not present

## 2020-10-19 DIAGNOSIS — N186 End stage renal disease: Secondary | ICD-10-CM | POA: Diagnosis not present

## 2020-10-22 ENCOUNTER — Telehealth: Payer: Self-pay

## 2020-10-22 NOTE — Telephone Encounter (Signed)
  Care Management   Follow Up Note   10/22/2020 Name: Lori Stanley MRN: [id number] DOB: 28-Jan-1932   Primary Care Provider: Jerrol Banana., MD Reason for referral : Chronic Care Management   [name] is currently enrolled in the Chronic Care Management program. Unable to complete outreach with her today due to lack of phone services. Successfully connected with her niece who indicated the issues are being resolved. Anticipates her having services within the next few weeks.    Follow Up Plan:  Will follow up within the next few weeks.    Cristy [facility] Care Management [facility] [phone]

## 2020-10-23 DIAGNOSIS — N186 End stage renal disease: Secondary | ICD-10-CM | POA: Diagnosis not present

## 2020-10-23 DIAGNOSIS — Z992 Dependence on renal dialysis: Secondary | ICD-10-CM | POA: Diagnosis not present

## 2020-10-26 DIAGNOSIS — N186 End stage renal disease: Secondary | ICD-10-CM | POA: Diagnosis not present

## 2020-10-26 DIAGNOSIS — Z992 Dependence on renal dialysis: Secondary | ICD-10-CM | POA: Diagnosis not present

## 2020-10-29 DIAGNOSIS — N186 End stage renal disease: Secondary | ICD-10-CM | POA: Diagnosis not present

## 2020-10-29 DIAGNOSIS — Z992 Dependence on renal dialysis: Secondary | ICD-10-CM | POA: Diagnosis not present

## 2020-10-30 DIAGNOSIS — N186 End stage renal disease: Secondary | ICD-10-CM | POA: Diagnosis not present

## 2020-10-30 DIAGNOSIS — Z992 Dependence on renal dialysis: Secondary | ICD-10-CM | POA: Diagnosis not present

## 2020-11-02 DIAGNOSIS — Z992 Dependence on renal dialysis: Secondary | ICD-10-CM | POA: Diagnosis not present

## 2020-11-02 DIAGNOSIS — N186 End stage renal disease: Secondary | ICD-10-CM | POA: Diagnosis not present

## 2020-11-06 DIAGNOSIS — N186 End stage renal disease: Secondary | ICD-10-CM | POA: Diagnosis not present

## 2020-11-06 DIAGNOSIS — Z992 Dependence on renal dialysis: Secondary | ICD-10-CM | POA: Diagnosis not present

## 2020-11-09 DIAGNOSIS — Z992 Dependence on renal dialysis: Secondary | ICD-10-CM | POA: Diagnosis not present

## 2020-11-09 DIAGNOSIS — N2581 Secondary hyperparathyroidism of renal origin: Secondary | ICD-10-CM | POA: Diagnosis not present

## 2020-11-09 DIAGNOSIS — N186 End stage renal disease: Secondary | ICD-10-CM | POA: Diagnosis not present

## 2020-11-13 DIAGNOSIS — Z992 Dependence on renal dialysis: Secondary | ICD-10-CM | POA: Diagnosis not present

## 2020-11-13 DIAGNOSIS — N186 End stage renal disease: Secondary | ICD-10-CM | POA: Diagnosis not present

## 2020-11-14 DIAGNOSIS — I5022 Chronic systolic (congestive) heart failure: Secondary | ICD-10-CM | POA: Diagnosis not present

## 2020-11-16 DIAGNOSIS — N186 End stage renal disease: Secondary | ICD-10-CM | POA: Diagnosis not present

## 2020-11-16 DIAGNOSIS — Z992 Dependence on renal dialysis: Secondary | ICD-10-CM | POA: Diagnosis not present

## 2020-11-20 ENCOUNTER — Ambulatory Visit (INDEPENDENT_AMBULATORY_CARE_PROVIDER_SITE_OTHER): Payer: Medicare Other

## 2020-11-20 DIAGNOSIS — I10 Essential (primary) hypertension: Secondary | ICD-10-CM | POA: Diagnosis not present

## 2020-11-20 DIAGNOSIS — Z992 Dependence on renal dialysis: Secondary | ICD-10-CM | POA: Diagnosis not present

## 2020-11-20 DIAGNOSIS — N186 End stage renal disease: Secondary | ICD-10-CM | POA: Diagnosis not present

## 2020-11-20 NOTE — Chronic Care Management (AMB) (Signed)
  Chronic Care Management   CCM RN Visit Note  11/20/2020 Name: Lori Stanley MRN: [id number] DOB: 09-22-1931   Subjective: Lori Stanley is a 87 y.o. year old female who is a primary care patient of Jerrol Banana., MD. The care management team was consulted for assistance with disease management and care coordination needs.  Her primary care provider will be notified of our unsuccessful attempts to maintain contact. The care management team will gladly outreach at any time in the future if she is interested in receiving assistance.   PLAN The care management team will gladly follow up with Lori Stanley after the primary care provider has a conversation with her regarding recommendation for care management engagement and subsequent re-referral for care management services.     Cristy [facility] Care Management [facility] [phone]

## 2020-11-23 DIAGNOSIS — Z992 Dependence on renal dialysis: Secondary | ICD-10-CM | POA: Diagnosis not present

## 2020-11-23 DIAGNOSIS — N186 End stage renal disease: Secondary | ICD-10-CM | POA: Diagnosis not present

## 2020-11-27 DIAGNOSIS — Z992 Dependence on renal dialysis: Secondary | ICD-10-CM | POA: Diagnosis not present

## 2020-11-27 DIAGNOSIS — E119 Type 2 diabetes mellitus without complications: Secondary | ICD-10-CM | POA: Diagnosis not present

## 2020-11-27 DIAGNOSIS — N186 End stage renal disease: Secondary | ICD-10-CM | POA: Diagnosis not present

## 2020-11-27 DIAGNOSIS — D509 Iron deficiency anemia, unspecified: Secondary | ICD-10-CM | POA: Diagnosis not present

## 2020-11-29 DIAGNOSIS — N186 End stage renal disease: Secondary | ICD-10-CM | POA: Diagnosis not present

## 2020-11-29 DIAGNOSIS — Z992 Dependence on renal dialysis: Secondary | ICD-10-CM | POA: Diagnosis not present

## 2020-11-30 DIAGNOSIS — N186 End stage renal disease: Secondary | ICD-10-CM | POA: Diagnosis not present

## 2020-11-30 DIAGNOSIS — Z992 Dependence on renal dialysis: Secondary | ICD-10-CM | POA: Diagnosis not present

## 2020-12-04 DIAGNOSIS — Z992 Dependence on renal dialysis: Secondary | ICD-10-CM | POA: Diagnosis not present

## 2020-12-04 DIAGNOSIS — N186 End stage renal disease: Secondary | ICD-10-CM | POA: Diagnosis not present

## 2020-12-07 DIAGNOSIS — Z992 Dependence on renal dialysis: Secondary | ICD-10-CM | POA: Diagnosis not present

## 2020-12-07 DIAGNOSIS — N186 End stage renal disease: Secondary | ICD-10-CM | POA: Diagnosis not present

## 2020-12-07 DIAGNOSIS — N2581 Secondary hyperparathyroidism of renal origin: Secondary | ICD-10-CM | POA: Diagnosis not present

## 2020-12-10 ENCOUNTER — Other Ambulatory Visit: Payer: Self-pay

## 2020-12-10 ENCOUNTER — Ambulatory Visit (INDEPENDENT_AMBULATORY_CARE_PROVIDER_SITE_OTHER): Payer: Medicare Other | Admitting: Family Medicine

## 2020-12-10 ENCOUNTER — Encounter: Payer: Self-pay | Admitting: Family Medicine

## 2020-12-10 VITALS — BP 161/50 | HR 66 | Temp 97.7°F | Resp 18 | Wt 111.4 lb

## 2020-12-10 DIAGNOSIS — F329 Major depressive disorder, single episode, unspecified: Secondary | ICD-10-CM

## 2020-12-10 DIAGNOSIS — I5033 Acute on chronic diastolic (congestive) heart failure: Secondary | ICD-10-CM | POA: Diagnosis not present

## 2020-12-10 DIAGNOSIS — M79674 Pain in right toe(s): Secondary | ICD-10-CM | POA: Diagnosis not present

## 2020-12-10 DIAGNOSIS — Z992 Dependence on renal dialysis: Secondary | ICD-10-CM | POA: Diagnosis not present

## 2020-12-10 DIAGNOSIS — E785 Hyperlipidemia, unspecified: Secondary | ICD-10-CM | POA: Diagnosis not present

## 2020-12-10 DIAGNOSIS — D631 Anemia in chronic kidney disease: Secondary | ICD-10-CM

## 2020-12-10 DIAGNOSIS — I10 Essential (primary) hypertension: Secondary | ICD-10-CM

## 2020-12-10 DIAGNOSIS — E1169 Type 2 diabetes mellitus with other specified complication: Secondary | ICD-10-CM | POA: Diagnosis not present

## 2020-12-10 DIAGNOSIS — M79675 Pain in left toe(s): Secondary | ICD-10-CM | POA: Diagnosis not present

## 2020-12-10 DIAGNOSIS — N186 End stage renal disease: Secondary | ICD-10-CM | POA: Diagnosis not present

## 2020-12-10 DIAGNOSIS — B351 Tinea unguium: Secondary | ICD-10-CM | POA: Diagnosis not present

## 2020-12-10 LAB — POCT GLYCOSYLATED HEMOGLOBIN (HGB A1C)
Est. average glucose Bld gHb Est-mCnc: 114
Hemoglobin A1C: 5.6 % (ref 4.0–5.6)

## 2020-12-10 NOTE — Progress Notes (Signed)
I,Roshena L Chambers,acting as a scribe for [name], MD.,have documented all relevant documentation on the behalf of [name], MD,as directed by  [name], MD while in the presence of [name], MD.    Established patient visit   Patient: Lori Stanley   DOB: Aug 27, 1931   87 y.o. Female  MRN: [id number] Visit Date: 12/10/2020  Today's healthcare provider: [name], MD   Chief Complaint  Patient presents with   Diabetes   Subjective    HPI  Patient is recently feeling stable.  She has Monday and Friday hemodialysis. She is slowly feeling weaker over the past year or 2. She also gets erythropoietin for anemia. Diabetes Mellitus Type II, follow-up  Lab Results  Component Value Date   HGBA1C 5.6 12/10/2020   HGBA1C 6.4 (H) 07/12/2020   HGBA1C 6.2 (A) 12/31/2019   Last seen for diabetes 3 months ago.  Management since then includes continuing the same treatment. She reports good compliance with treatment. She is not having side effects.   Home blood sugar records:  blood sugars are not checked  Episodes of hypoglycemia? No    Current insulin regiment: none Most Recent Eye Exam: not UTD  --------------------------------------------------------------------------------------------------- Hypertension, follow-up  BP Readings from Last 3 Encounters:  12/10/20 (!) 161/50  09/15/20 (!) 167/60  08/25/20 (!) 136/55   Wt Readings from Last 3 Encounters:  12/10/20 111 lb 6.4 oz (50.5 kg)  09/15/20 114 lb (51.7 kg)  08/25/20 112 lb (50.8 kg)     She was last seen for hypertension 3 months ago.  BP at that visit was 167/60. Management since that visit includes; on amlodipine and carvedilol. She reports good compliance with treatment. She is not having side effects.  She is not exercising. She is adherent to low salt diet.   Outside blood pressures are not checked.  She does not smoke.  Use of agents  associated with hypertension: none.   ---------------------------------------------------------------------------------------------------       Medications: Outpatient Medications Prior to Visit  Medication Sig   acetaminophen (TYLENOL) 500 MG tablet Take 500-1,000 mg by mouth every 6 (six) hours as needed for mild pain or fever.    albuterol (VENTOLIN HFA) 108 (90 Base) MCG/ACT inhaler TAKE 2 PUFFS BY MOUTH EVERY 6 HOURS AS NEEDED FOR WHEEZE OR SHORTNESS OF BREATH (Patient taking differently: Inhale 2 puffs into the lungs every 6 (six) hours as needed for wheezing or shortness of breath.)   Bepotastine Besilate 1.5 % SOLN Place 1 drop into both eyes in the morning and at bedtime. Morning & afternoon.   brimonidine (ALPHAGAN) 0.2 % ophthalmic solution Place 1 drop into both eyes 2 (two) times daily.    calcitRIOL (ROCALTROL) 0.25 MCG capsule Take 0.25 mcg by mouth every Monday, Wednesday, and Friday.    carvedilol (COREG) 12.5 MG tablet TAKE 1 TABLET BY MOUTH 2 TIMES DAILY.   [facility] ASPIRIN ADULT LOW DOSE 81 MG chewable tablet CHEW 1 TABLET (81 MG TOTAL) BY MOUTH DAILY.   dorzolamide-timolol (COSOPT) 22.3-6.8 MG/ML ophthalmic solution Place 1 drop into both eyes 2 (two) times daily.    latanoprost (XALATAN) 0.005 % ophthalmic solution Place 1 drop into both eyes at bedtime.   loratadine (CLARITIN) 10 MG tablet Take 1 tablet (10 mg total) by mouth daily.   multivitamin (RENA-VIT) TABS tablet Take 1 tablet by mouth daily.    OXYGEN Inhale 2 L into the lungs at bedtime.  sertraline (ZOLOFT) 25 MG tablet TAKE 1 TABLET BY MOUTH EVERY DAY   torsemide (DEMADEX) 100 MG tablet Take 100 mg by mouth daily.   traZODone (DESYREL) 150 MG tablet TAKE 1 TABLET (150 MG TOTAL) BY MOUTH AT BEDTIME. (Patient taking differently: Take 150 mg by mouth at bedtime.)   amLODipine (NORVASC) 5 MG tablet Take 1 tablet (5 mg total) by mouth daily. (Patient not taking: Reported on 12/10/2020)   No facility-administered  medications prior to visit.    Review of Systems  Constitutional:  Positive for fatigue. Negative for appetite change, chills and fever.  Respiratory:  Negative for chest tightness and shortness of breath.   Cardiovascular:  Negative for chest pain and palpitations.  Gastrointestinal:  Negative for abdominal pain, nausea and vomiting.  Neurological:  Negative for dizziness and weakness.       Objective    BP (!) 161/50 (BP Location: Right Arm, Patient Position: Sitting, Cuff Size: Normal)   Pulse 66   Temp 97.7 F (36.5 C) (Temporal)   Resp 18   Wt 111 lb 6.4 oz (50.5 kg)   SpO2 98% Comment: room air  BMI 20.38 kg/m  BP Readings from Last 3 Encounters:  12/10/20 (!) 161/50  09/15/20 (!) 167/60  08/25/20 (!) 136/55   Wt Readings from Last 3 Encounters:  12/10/20 111 lb 6.4 oz (50.5 kg)  09/15/20 114 lb (51.7 kg)  08/25/20 112 lb (50.8 kg)       Physical Exam Vitals reviewed.  HENT:     Head: Normocephalic and atraumatic.     Right Ear: External ear normal.     Left Ear: External ear normal.  Eyes:     General: No scleral icterus.    Conjunctiva/sclera: Conjunctivae normal.  Cardiovascular:     Rate and Rhythm: Normal rate and regular rhythm.     Heart sounds: Normal heart sounds.    No friction rub. No gallop.  Pulmonary:     Effort: No respiratory distress.     Breath sounds: Normal breath sounds.  Abdominal:     Palpations: Abdomen is soft.  Musculoskeletal:     Right lower leg: Edema present.     Left lower leg: Edema present.     Comments: Chronic 1+ LE edema.  Skin:    General: Skin is warm and dry.  Neurological:     General: No focal deficit present.     Mental Status: She is alert and oriented to person, place, and time.  Psychiatric:        Mood and Affect: Mood normal.        Behavior: Behavior normal.        Thought Content: Thought content normal.        Judgment: Judgment normal.      Results for orders placed or performed in visit on  12/10/20  POCT glycosylated hemoglobin (Hb A1C)  Result Value Ref Range   Hemoglobin A1C 5.6 4.0 - 5.6 %   Est. average glucose Bld gHb Est-mCnc 114     Assessment & Plan     1. Type 2 diabetes mellitus with hyperlipidemia (HCC) A1c 5.6 on diet and exercise alone.  Follow-up twice a year now. - POCT glycosylated hemoglobin (Hb A1C)  2. Essential hypertension Fair control with patient on hemodialysis.  3. ESRD (end stage renal disease) ([place]) On hemodialysis twice a week  4. Anemia in chronic kidney disease, on chronic dialysis (HCC) On erythropoietin per hematology  5. Acute on chronic  heart failure with preserved ejection fraction (HFpEF) (HCC) On carvedilol and torsemide  6. Reactive depression On sertraline and trazodone   No follow-ups on file.      I, [name], MD, have reviewed all documentation for this visit. The documentation on 12/20/20 for the exam, diagnosis, procedures, and orders are all accurate and complete.    Vieno Tarrant Cranford Mon, MD  [facility] [phone] (phone) [phone] (fax)  [place]

## 2020-12-11 DIAGNOSIS — Z992 Dependence on renal dialysis: Secondary | ICD-10-CM | POA: Diagnosis not present

## 2020-12-11 DIAGNOSIS — N186 End stage renal disease: Secondary | ICD-10-CM | POA: Diagnosis not present

## 2020-12-14 DIAGNOSIS — N186 End stage renal disease: Secondary | ICD-10-CM | POA: Diagnosis not present

## 2020-12-14 DIAGNOSIS — Z992 Dependence on renal dialysis: Secondary | ICD-10-CM | POA: Diagnosis not present

## 2020-12-15 DIAGNOSIS — I5022 Chronic systolic (congestive) heart failure: Secondary | ICD-10-CM | POA: Diagnosis not present

## 2020-12-18 DIAGNOSIS — Z992 Dependence on renal dialysis: Secondary | ICD-10-CM | POA: Diagnosis not present

## 2020-12-18 DIAGNOSIS — N186 End stage renal disease: Secondary | ICD-10-CM | POA: Diagnosis not present

## 2020-12-21 DIAGNOSIS — N2581 Secondary hyperparathyroidism of renal origin: Secondary | ICD-10-CM | POA: Diagnosis not present

## 2020-12-21 DIAGNOSIS — Z992 Dependence on renal dialysis: Secondary | ICD-10-CM | POA: Diagnosis not present

## 2020-12-21 DIAGNOSIS — N186 End stage renal disease: Secondary | ICD-10-CM | POA: Diagnosis not present

## 2020-12-25 DIAGNOSIS — N186 End stage renal disease: Secondary | ICD-10-CM | POA: Diagnosis not present

## 2020-12-25 DIAGNOSIS — Z992 Dependence on renal dialysis: Secondary | ICD-10-CM | POA: Diagnosis not present

## 2020-12-28 DIAGNOSIS — N186 End stage renal disease: Secondary | ICD-10-CM | POA: Diagnosis not present

## 2020-12-28 DIAGNOSIS — Z992 Dependence on renal dialysis: Secondary | ICD-10-CM | POA: Diagnosis not present

## 2020-12-30 DIAGNOSIS — N186 End stage renal disease: Secondary | ICD-10-CM | POA: Diagnosis not present

## 2020-12-30 DIAGNOSIS — Z992 Dependence on renal dialysis: Secondary | ICD-10-CM | POA: Diagnosis not present

## 2021-01-01 DIAGNOSIS — Z992 Dependence on renal dialysis: Secondary | ICD-10-CM | POA: Diagnosis not present

## 2021-01-01 DIAGNOSIS — N186 End stage renal disease: Secondary | ICD-10-CM | POA: Diagnosis not present

## 2021-01-04 DIAGNOSIS — N186 End stage renal disease: Secondary | ICD-10-CM | POA: Diagnosis not present

## 2021-01-04 DIAGNOSIS — Z992 Dependence on renal dialysis: Secondary | ICD-10-CM | POA: Diagnosis not present

## 2021-01-08 DIAGNOSIS — N186 End stage renal disease: Secondary | ICD-10-CM | POA: Diagnosis not present

## 2021-01-08 DIAGNOSIS — Z992 Dependence on renal dialysis: Secondary | ICD-10-CM | POA: Diagnosis not present

## 2021-01-11 DIAGNOSIS — Z992 Dependence on renal dialysis: Secondary | ICD-10-CM | POA: Diagnosis not present

## 2021-01-11 DIAGNOSIS — N186 End stage renal disease: Secondary | ICD-10-CM | POA: Diagnosis not present

## 2021-01-11 DIAGNOSIS — N2581 Secondary hyperparathyroidism of renal origin: Secondary | ICD-10-CM | POA: Diagnosis not present

## 2021-01-15 DIAGNOSIS — N186 End stage renal disease: Secondary | ICD-10-CM | POA: Diagnosis not present

## 2021-01-15 DIAGNOSIS — Z992 Dependence on renal dialysis: Secondary | ICD-10-CM | POA: Diagnosis not present

## 2021-01-15 DIAGNOSIS — I5022 Chronic systolic (congestive) heart failure: Secondary | ICD-10-CM | POA: Diagnosis not present

## 2021-01-17 IMAGING — CR DG CHEST 2V
2 series · 2 of 2 positions shown · non-contrast
Comparison: 06/13/2019

CLINICAL DATA: Shortness of breath for 1 week, TB screening

EXAM:
CHEST - 2 VIEW

[chest pa]
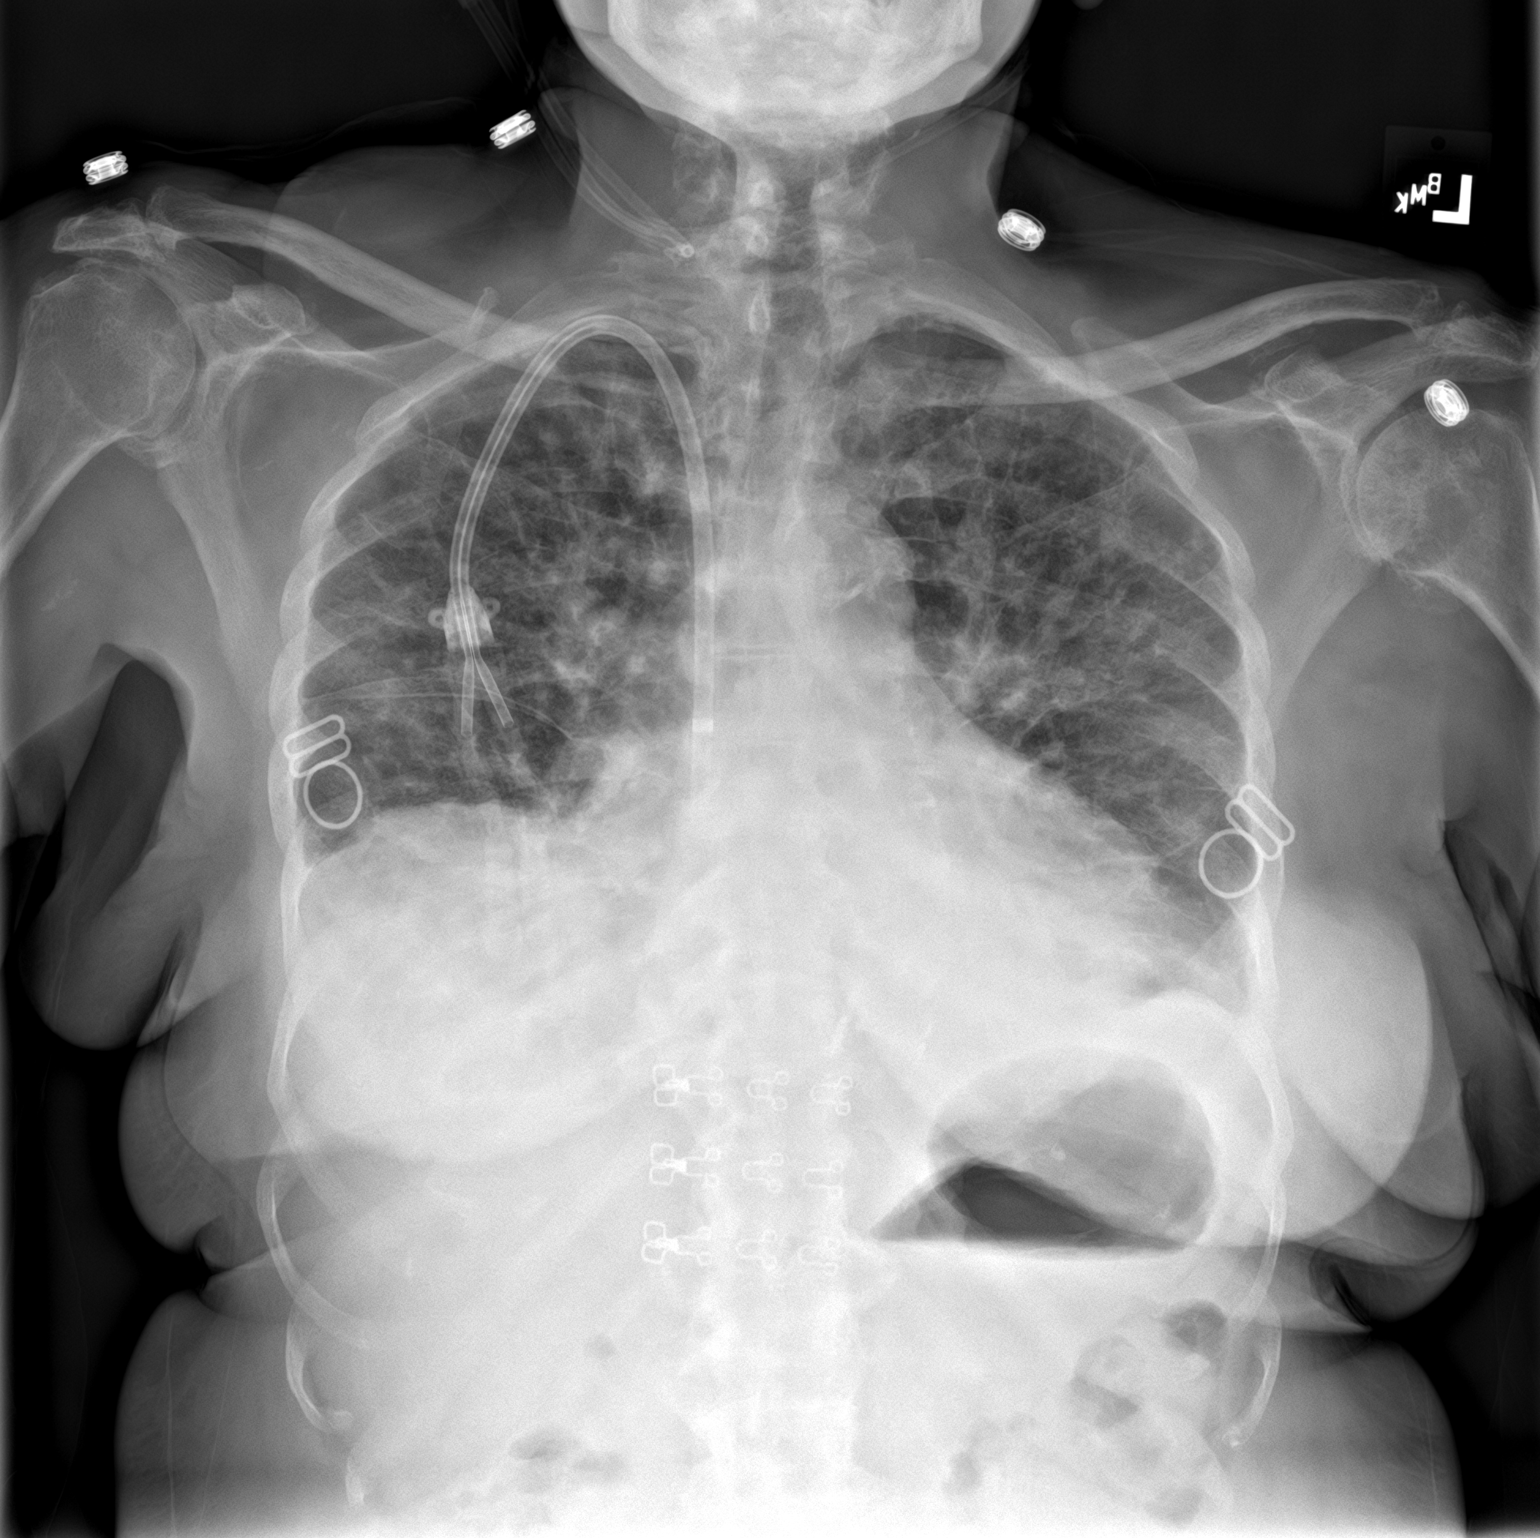

[chest lat]
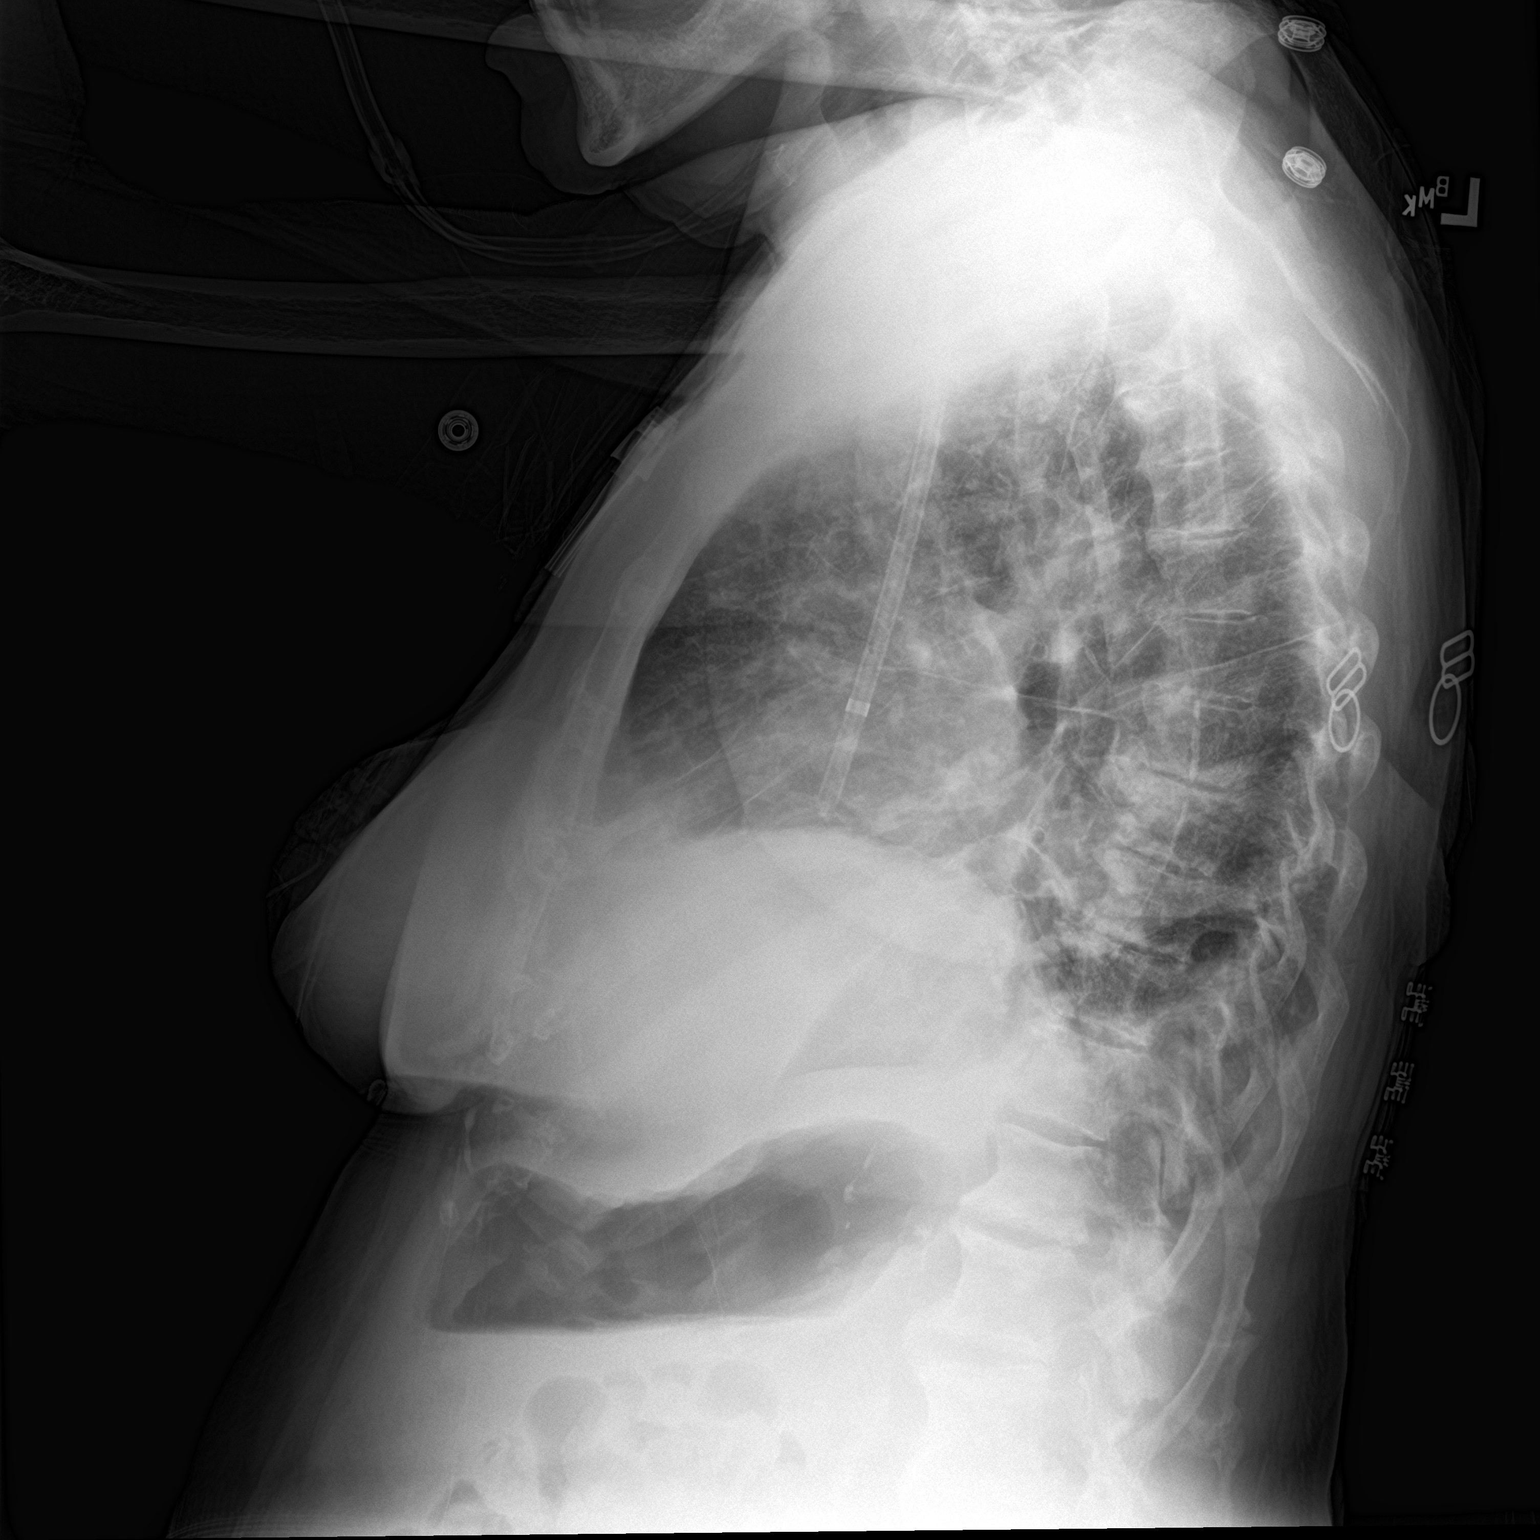

[2 of 2 positions shown; findings below may reference images not displayed]

FINDINGS: Cardiac shadow is enlarged but stable. Aortic calcifications are
noted. Dialysis catheter is again seen and stable. Lungs are well
aerated bilaterally. Vascular congestion is seen. Chronic scarring
in the left base is noted. No bony abnormality is noted.
IMPRESSION: Stable vascular congestion similar to that seen on the prior exam.
No acute abnormality is noted.

## 2021-01-18 DIAGNOSIS — Z992 Dependence on renal dialysis: Secondary | ICD-10-CM | POA: Diagnosis not present

## 2021-01-18 DIAGNOSIS — N186 End stage renal disease: Secondary | ICD-10-CM | POA: Diagnosis not present

## 2021-01-22 DIAGNOSIS — N186 End stage renal disease: Secondary | ICD-10-CM | POA: Diagnosis not present

## 2021-01-22 DIAGNOSIS — Z992 Dependence on renal dialysis: Secondary | ICD-10-CM | POA: Diagnosis not present

## 2021-01-25 DIAGNOSIS — N186 End stage renal disease: Secondary | ICD-10-CM | POA: Diagnosis not present

## 2021-01-25 DIAGNOSIS — Z992 Dependence on renal dialysis: Secondary | ICD-10-CM | POA: Diagnosis not present

## 2021-01-26 ENCOUNTER — Telehealth: Payer: Self-pay | Admitting: Family Medicine

## 2021-01-26 NOTE — Telephone Encounter (Signed)
Kara from [facility] [employer] called to confirm if a fax was received for OV note request and RX for diabetic shoes/ she is faxing it over again now/ please advise

## 2021-01-27 ENCOUNTER — Ambulatory Visit (INDEPENDENT_AMBULATORY_CARE_PROVIDER_SITE_OTHER): Payer: Medicare Other

## 2021-01-27 ENCOUNTER — Other Ambulatory Visit: Payer: Self-pay

## 2021-01-27 DIAGNOSIS — Z23 Encounter for immunization: Secondary | ICD-10-CM

## 2021-01-29 DIAGNOSIS — Z992 Dependence on renal dialysis: Secondary | ICD-10-CM | POA: Diagnosis not present

## 2021-01-29 DIAGNOSIS — N186 End stage renal disease: Secondary | ICD-10-CM | POA: Diagnosis not present

## 2021-01-29 NOTE — Telephone Encounter (Signed)
Faxed was received and placed in providers box.

## 2021-02-01 DIAGNOSIS — N186 End stage renal disease: Secondary | ICD-10-CM | POA: Diagnosis not present

## 2021-02-01 DIAGNOSIS — Z992 Dependence on renal dialysis: Secondary | ICD-10-CM | POA: Diagnosis not present

## 2021-02-05 DIAGNOSIS — N186 End stage renal disease: Secondary | ICD-10-CM | POA: Diagnosis not present

## 2021-02-05 DIAGNOSIS — Z992 Dependence on renal dialysis: Secondary | ICD-10-CM | POA: Diagnosis not present

## 2021-02-08 DIAGNOSIS — N186 End stage renal disease: Secondary | ICD-10-CM | POA: Diagnosis not present

## 2021-02-08 DIAGNOSIS — N2581 Secondary hyperparathyroidism of renal origin: Secondary | ICD-10-CM | POA: Diagnosis not present

## 2021-02-08 DIAGNOSIS — Z992 Dependence on renal dialysis: Secondary | ICD-10-CM | POA: Diagnosis not present

## 2021-02-09 NOTE — Telephone Encounter (Signed)
Lori Stanley from [facility] was following up to see if Dr [name] completed and faxed the paperwork back, caller stated she still has not received anything for the pt.

## 2021-02-10 NOTE — Telephone Encounter (Signed)
Order is being faxed.

## 2021-02-12 DIAGNOSIS — Z992 Dependence on renal dialysis: Secondary | ICD-10-CM | POA: Diagnosis not present

## 2021-02-12 DIAGNOSIS — N186 End stage renal disease: Secondary | ICD-10-CM | POA: Diagnosis not present

## 2021-02-14 DIAGNOSIS — I5022 Chronic systolic (congestive) heart failure: Secondary | ICD-10-CM | POA: Diagnosis not present

## 2021-02-15 DIAGNOSIS — N186 End stage renal disease: Secondary | ICD-10-CM | POA: Diagnosis not present

## 2021-02-15 DIAGNOSIS — Z992 Dependence on renal dialysis: Secondary | ICD-10-CM | POA: Diagnosis not present

## 2021-02-19 DIAGNOSIS — Z992 Dependence on renal dialysis: Secondary | ICD-10-CM | POA: Diagnosis not present

## 2021-02-19 DIAGNOSIS — N186 End stage renal disease: Secondary | ICD-10-CM | POA: Diagnosis not present

## 2021-02-22 DIAGNOSIS — D509 Iron deficiency anemia, unspecified: Secondary | ICD-10-CM | POA: Diagnosis not present

## 2021-02-22 DIAGNOSIS — E119 Type 2 diabetes mellitus without complications: Secondary | ICD-10-CM | POA: Diagnosis not present

## 2021-02-22 DIAGNOSIS — N186 End stage renal disease: Secondary | ICD-10-CM | POA: Diagnosis not present

## 2021-02-22 DIAGNOSIS — Z992 Dependence on renal dialysis: Secondary | ICD-10-CM | POA: Diagnosis not present

## 2021-02-26 DIAGNOSIS — N186 End stage renal disease: Secondary | ICD-10-CM | POA: Diagnosis not present

## 2021-02-26 DIAGNOSIS — Z992 Dependence on renal dialysis: Secondary | ICD-10-CM | POA: Diagnosis not present

## 2021-03-01 DIAGNOSIS — N186 End stage renal disease: Secondary | ICD-10-CM | POA: Diagnosis not present

## 2021-03-01 DIAGNOSIS — Z992 Dependence on renal dialysis: Secondary | ICD-10-CM | POA: Diagnosis not present

## 2021-03-05 DIAGNOSIS — N186 End stage renal disease: Secondary | ICD-10-CM | POA: Diagnosis not present

## 2021-03-05 DIAGNOSIS — Z992 Dependence on renal dialysis: Secondary | ICD-10-CM | POA: Diagnosis not present

## 2021-03-08 DIAGNOSIS — Z992 Dependence on renal dialysis: Secondary | ICD-10-CM | POA: Diagnosis not present

## 2021-03-08 DIAGNOSIS — N186 End stage renal disease: Secondary | ICD-10-CM | POA: Diagnosis not present

## 2021-03-12 DIAGNOSIS — N186 End stage renal disease: Secondary | ICD-10-CM | POA: Diagnosis not present

## 2021-03-12 DIAGNOSIS — Z992 Dependence on renal dialysis: Secondary | ICD-10-CM | POA: Diagnosis not present

## 2021-03-15 DIAGNOSIS — Z992 Dependence on renal dialysis: Secondary | ICD-10-CM | POA: Diagnosis not present

## 2021-03-15 DIAGNOSIS — N186 End stage renal disease: Secondary | ICD-10-CM | POA: Diagnosis not present

## 2021-03-15 DIAGNOSIS — N2581 Secondary hyperparathyroidism of renal origin: Secondary | ICD-10-CM | POA: Diagnosis not present

## 2021-03-16 ENCOUNTER — Ambulatory Visit (INDEPENDENT_AMBULATORY_CARE_PROVIDER_SITE_OTHER): Payer: Medicare Other | Admitting: Family Medicine

## 2021-03-16 ENCOUNTER — Other Ambulatory Visit: Payer: Self-pay

## 2021-03-16 VITALS — BP 105/53 | HR 82 | Temp 99.1°F | Wt 101.7 lb

## 2021-03-16 DIAGNOSIS — E785 Hyperlipidemia, unspecified: Secondary | ICD-10-CM | POA: Diagnosis not present

## 2021-03-16 DIAGNOSIS — I5032 Chronic diastolic (congestive) heart failure: Secondary | ICD-10-CM

## 2021-03-16 DIAGNOSIS — E1169 Type 2 diabetes mellitus with other specified complication: Secondary | ICD-10-CM | POA: Diagnosis not present

## 2021-03-16 DIAGNOSIS — N186 End stage renal disease: Secondary | ICD-10-CM

## 2021-03-16 DIAGNOSIS — I10 Essential (primary) hypertension: Secondary | ICD-10-CM | POA: Diagnosis not present

## 2021-03-16 DIAGNOSIS — F329 Major depressive disorder, single episode, unspecified: Secondary | ICD-10-CM | POA: Diagnosis not present

## 2021-03-16 DIAGNOSIS — N184 Chronic kidney disease, stage 4 (severe): Secondary | ICD-10-CM

## 2021-03-16 DIAGNOSIS — D631 Anemia in chronic kidney disease: Secondary | ICD-10-CM

## 2021-03-16 DIAGNOSIS — Z992 Dependence on renal dialysis: Secondary | ICD-10-CM | POA: Diagnosis not present

## 2021-03-16 NOTE — Progress Notes (Signed)
Established patient visit   Patient: Lori Stanley   DOB: [birth year]   87 y.o. Female  MRN: [id number] Visit Date: 03/16/2021  Today's healthcare provider: [name], MD   Chief Complaint  Patient presents with   Follow-up   Diabetes   Hypertension   Subjective    HPI  Patient comes in today for follow-up of chronic medical problems.  She is getting dialysis twice a week. She is widowed and her only child died at age 70 in 15s.  That occurred in November of that year and it is bothered her ever since then.  She has a chronic mild depression and anxiety issues and has been fairly well controlled on sertraline 25 mg daily and trazodone 150 mg for sleep. Patient has had no syncope or falls recently. She has chronic arthritic pains especially in her left shoulder. She is taking medications as prescribed. Again, she gets hemodialysis twice a week. Diabetes Mellitus Type II, follow-up  Lab Results  Component Value Date   HGBA1C 5.6 12/10/2020   HGBA1C 6.4 (H) 07/12/2020   HGBA1C 6.2 (A) 12/31/2019   Last seen for diabetes 3 months ago.  Management since then includes continuing the same treatment. She reports good compliance with treatment. She is not having side effects. none  Home blood sugar records: fasting range: not checking  Episodes of hypoglycemia? No none   Current insulin regiment: n/a Most Recent Eye Exam: due  --------------------------------------------------------------------------------------------------- Hypertension, follow-up  BP Readings from Last 3 Encounters:  03/16/21 (!) 105/53  12/10/20 (!) 161/50  09/15/20 (!) 167/60   Wt Readings from Last 3 Encounters:  03/16/21 101 lb 11.2 oz (46.1 kg)  12/10/20 111 lb 6.4 oz (50.5 kg)  09/15/20 114 lb (51.7 kg)     She was last seen for hypertension 3 months ago.  BP at that visit was 161/50. Management since that visit includes; Fair control with patient on hemodialysis. She  reports good compliance with treatment. She is not having side effects. none She is not exercising. She is adherent to low salt diet.   Outside blood pressures are checks ossionally.  She does not smoke.  Use of agents associated with hypertension: none.   --------------------------------------------------------------------------------------------------- Depression, Follow-up  She  was last seen for this 3 months ago. Changes made at last visit include; On sertraline and trazodone.   She reports good compliance with treatment. She is not having side effects. none  She reports good tolerance of treatment. Current symptoms include:  n/a She feels she is Unchanged since last visit.  Depression screen [facility] 2/9 05/20/2020 12/26/2019 05/23/2019  Decreased Interest 1 0 0  Down, Depressed, Hopeless 3 0 1  PHQ - 2 Score 4 0 1  Altered sleeping 0 - -  Tired, decreased energy 0 - -  Change in appetite 0 - -  Feeling bad or failure about yourself  0 - -  Trouble concentrating 0 - -  Moving slowly or fidgety/restless 0 - -  Suicidal thoughts 0 - -  PHQ-9 Score 4 - -  Difficult doing work/chores Not difficult at all - -  Some recent data might be hidden    -----------------------------------------------------------------------------------------      Medications: Outpatient Medications Prior to Visit  Medication Sig   acetaminophen (TYLENOL) 500 MG tablet Take 500-1,000 mg by mouth every 6 (six) hours as needed for mild pain or fever.    albuterol (VENTOLIN HFA) 108 (90 Base) MCG/ACT inhaler TAKE 2  PUFFS BY MOUTH EVERY 6 HOURS AS NEEDED FOR WHEEZE OR SHORTNESS OF BREATH (Patient taking differently: Inhale 2 puffs into the lungs every 6 (six) hours as needed for wheezing or shortness of breath.)   amLODipine (NORVASC) 5 MG tablet Take 1 tablet (5 mg total) by mouth daily.   Bepotastine Besilate 1.5 % SOLN Place 1 drop into both eyes in the morning and at bedtime. Morning & afternoon.    brimonidine (ALPHAGAN) 0.2 % ophthalmic solution Place 1 drop into both eyes 2 (two) times daily.    calcitRIOL (ROCALTROL) 0.25 MCG capsule Take 0.25 mcg by mouth every Monday, Wednesday, and Friday.    carvedilol (COREG) 12.5 MG tablet TAKE 1 TABLET BY MOUTH 2 TIMES DAILY.   dorzolamide-timolol (COSOPT) 22.3-6.8 MG/ML ophthalmic solution Place 1 drop into both eyes 2 (two) times daily.    latanoprost (XALATAN) 0.005 % ophthalmic solution Place 1 drop into both eyes at bedtime.   loratadine (CLARITIN) 10 MG tablet Take 1 tablet (10 mg total) by mouth daily.   multivitamin (RENA-VIT) TABS tablet Take 1 tablet by mouth daily.    OXYGEN Inhale 2 L into the lungs at bedtime.   sertraline (ZOLOFT) 25 MG tablet TAKE 1 TABLET BY MOUTH EVERY DAY   torsemide (DEMADEX) 100 MG tablet Take 100 mg by mouth daily.   traZODone (DESYREL) 150 MG tablet TAKE 1 TABLET (150 MG TOTAL) BY MOUTH AT BEDTIME. (Patient taking differently: Take 150 mg by mouth at bedtime.)   [facility] ASPIRIN ADULT LOW DOSE 81 MG chewable tablet CHEW 1 TABLET (81 MG TOTAL) BY MOUTH DAILY. (Patient not taking: Reported on 03/16/2021)   No facility-administered medications prior to visit.    Review of Systems  Constitutional:  Negative for appetite change, chills, fatigue and fever.  Respiratory:  Negative for chest tightness and shortness of breath.   Cardiovascular:  Negative for chest pain and palpitations.  Gastrointestinal:  Negative for abdominal pain, nausea and vomiting.  Neurological:  Negative for dizziness and weakness.   Last hemoglobin A1c Lab Results  Component Value Date   HGBA1C 5.6 12/10/2020       Objective    BP (!) 105/53 (BP Location: Right Arm, Patient Position: Sitting, Cuff Size: Small)   Pulse 82   Temp 99.1 F (37.3 C)   Wt 101 lb 11.2 oz (46.1 kg)   SpO2 97%   BMI 18.60 kg/m  BP Readings from Last 3 Encounters:  03/16/21 (!) 105/53  12/10/20 (!) 161/50  09/15/20 (!) 167/60   Wt Readings from Last  3 Encounters:  03/16/21 101 lb 11.2 oz (46.1 kg)  12/10/20 111 lb 6.4 oz (50.5 kg)  09/15/20 114 lb (51.7 kg)      Physical Exam Vitals reviewed.  HENT:     Head: Normocephalic and atraumatic.     Right Ear: External ear normal.     Left Ear: External ear normal.  Eyes:     General: No scleral icterus.    Conjunctiva/sclera: Conjunctivae normal.  Cardiovascular:     Rate and Rhythm: Normal rate and regular rhythm.     Heart sounds: Murmur heard.    No friction rub. No gallop.  Pulmonary:     Effort: No respiratory distress.     Breath sounds: Normal breath sounds.  Abdominal:     Palpations: Abdomen is soft.  Musculoskeletal:     Right lower leg: Edema present.     Left lower leg: Edema present.     Comments:  Chronic 1+ LE edema.  Skin:    General: Skin is warm and dry.  Neurological:     General: No focal deficit present.     Mental Status: She is alert and oriented to person, place, and time.  Psychiatric:        Mood and Affect: Mood normal.        Behavior: Behavior normal.        Thought Content: Thought content normal.        Judgment: Judgment normal.      No results found for any visits on 03/16/21.  Assessment & Plan     1. Type 2 diabetes mellitus with hyperlipidemia (HCC) Prediabetic not diabetic.  Follow-up A1c on next visit in the spring.  2. Essential hypertension Relatively hypotensive without being lightheaded today.  Continue to follow home blood pressure readings also.  3. Reactive depression Continue sertraline for life.  4. Chronic diastolic CHF (congestive heart failure) (HCC) On torsemide 100 mg daily  5. Essential hypertension, malignant On amlodipine 5 Coreg 12.5 twice a day  6. Anemia due to stage 4 chronic kidney disease (HCC) Follow-up CBC plus other labs on her next visit.  Labs routinely drawn by nephrology.  7. End-stage renal disease on hemodialysis ([place])  Of dialysis twice with   Return in about 6 months (around  09/13/2021).      I, [name], MD, have reviewed all documentation for this visit. The documentation on 03/16/21 for the exam, diagnosis, procedures, and orders are all accurate and complete.    Lizanne Erker Cranford Mon, MD  [facility] [phone] (phone) [phone] (fax)  [place]

## 2021-03-19 DIAGNOSIS — N186 End stage renal disease: Secondary | ICD-10-CM | POA: Diagnosis not present

## 2021-03-19 DIAGNOSIS — Z992 Dependence on renal dialysis: Secondary | ICD-10-CM | POA: Diagnosis not present

## 2021-03-22 DIAGNOSIS — Z992 Dependence on renal dialysis: Secondary | ICD-10-CM | POA: Diagnosis not present

## 2021-03-22 DIAGNOSIS — N186 End stage renal disease: Secondary | ICD-10-CM | POA: Diagnosis not present

## 2021-03-26 DIAGNOSIS — Z992 Dependence on renal dialysis: Secondary | ICD-10-CM | POA: Diagnosis not present

## 2021-03-26 DIAGNOSIS — N186 End stage renal disease: Secondary | ICD-10-CM | POA: Diagnosis not present

## 2021-03-29 DIAGNOSIS — Z992 Dependence on renal dialysis: Secondary | ICD-10-CM | POA: Diagnosis not present

## 2021-03-29 DIAGNOSIS — N186 End stage renal disease: Secondary | ICD-10-CM | POA: Diagnosis not present

## 2021-03-31 DIAGNOSIS — N186 End stage renal disease: Secondary | ICD-10-CM | POA: Diagnosis not present

## 2021-03-31 DIAGNOSIS — Z992 Dependence on renal dialysis: Secondary | ICD-10-CM | POA: Diagnosis not present

## 2021-04-02 DIAGNOSIS — Z992 Dependence on renal dialysis: Secondary | ICD-10-CM | POA: Diagnosis not present

## 2021-04-02 DIAGNOSIS — N186 End stage renal disease: Secondary | ICD-10-CM | POA: Diagnosis not present

## 2021-04-05 DIAGNOSIS — N186 End stage renal disease: Secondary | ICD-10-CM | POA: Diagnosis not present

## 2021-04-05 DIAGNOSIS — Z992 Dependence on renal dialysis: Secondary | ICD-10-CM | POA: Diagnosis not present

## 2021-04-06 DIAGNOSIS — E1169 Type 2 diabetes mellitus with other specified complication: Secondary | ICD-10-CM | POA: Diagnosis not present

## 2021-04-09 DIAGNOSIS — N186 End stage renal disease: Secondary | ICD-10-CM | POA: Diagnosis not present

## 2021-04-09 DIAGNOSIS — Z992 Dependence on renal dialysis: Secondary | ICD-10-CM | POA: Diagnosis not present

## 2021-04-12 DIAGNOSIS — N2581 Secondary hyperparathyroidism of renal origin: Secondary | ICD-10-CM | POA: Diagnosis not present

## 2021-04-12 DIAGNOSIS — N186 End stage renal disease: Secondary | ICD-10-CM | POA: Diagnosis not present

## 2021-04-12 DIAGNOSIS — Z992 Dependence on renal dialysis: Secondary | ICD-10-CM | POA: Diagnosis not present

## 2021-04-15 ENCOUNTER — Other Ambulatory Visit: Payer: Self-pay | Admitting: Family Medicine

## 2021-04-16 DIAGNOSIS — Z992 Dependence on renal dialysis: Secondary | ICD-10-CM | POA: Diagnosis not present

## 2021-04-16 DIAGNOSIS — N186 End stage renal disease: Secondary | ICD-10-CM | POA: Diagnosis not present

## 2021-04-16 NOTE — Telephone Encounter (Signed)
Please advise refill?  LOV: 03/16/2021 NOV: 09/21/2021 Last refill: 05/07/2020 #90 w/3 refills

## 2021-04-19 DIAGNOSIS — N186 End stage renal disease: Secondary | ICD-10-CM | POA: Diagnosis not present

## 2021-04-19 DIAGNOSIS — Z992 Dependence on renal dialysis: Secondary | ICD-10-CM | POA: Diagnosis not present

## 2021-04-19 DIAGNOSIS — N2581 Secondary hyperparathyroidism of renal origin: Secondary | ICD-10-CM | POA: Diagnosis not present

## 2021-04-23 DIAGNOSIS — N186 End stage renal disease: Secondary | ICD-10-CM | POA: Diagnosis not present

## 2021-04-23 DIAGNOSIS — Z992 Dependence on renal dialysis: Secondary | ICD-10-CM | POA: Diagnosis not present

## 2021-04-26 DIAGNOSIS — Z992 Dependence on renal dialysis: Secondary | ICD-10-CM | POA: Diagnosis not present

## 2021-04-26 DIAGNOSIS — N186 End stage renal disease: Secondary | ICD-10-CM | POA: Diagnosis not present

## 2021-04-29 ENCOUNTER — Other Ambulatory Visit: Payer: Self-pay | Admitting: Family Medicine

## 2021-04-29 DIAGNOSIS — F329 Major depressive disorder, single episode, unspecified: Secondary | ICD-10-CM

## 2021-04-30 DIAGNOSIS — N186 End stage renal disease: Secondary | ICD-10-CM | POA: Diagnosis not present

## 2021-04-30 DIAGNOSIS — Z992 Dependence on renal dialysis: Secondary | ICD-10-CM | POA: Diagnosis not present

## 2021-05-01 DIAGNOSIS — N186 End stage renal disease: Secondary | ICD-10-CM | POA: Diagnosis not present

## 2021-05-01 DIAGNOSIS — Z992 Dependence on renal dialysis: Secondary | ICD-10-CM | POA: Diagnosis not present

## 2021-05-03 DIAGNOSIS — Z992 Dependence on renal dialysis: Secondary | ICD-10-CM | POA: Diagnosis not present

## 2021-05-03 DIAGNOSIS — N186 End stage renal disease: Secondary | ICD-10-CM | POA: Diagnosis not present

## 2021-05-05 DIAGNOSIS — Z992 Dependence on renal dialysis: Secondary | ICD-10-CM | POA: Diagnosis not present

## 2021-05-05 DIAGNOSIS — N186 End stage renal disease: Secondary | ICD-10-CM | POA: Diagnosis not present

## 2021-05-07 DIAGNOSIS — Z992 Dependence on renal dialysis: Secondary | ICD-10-CM | POA: Diagnosis not present

## 2021-05-07 DIAGNOSIS — N186 End stage renal disease: Secondary | ICD-10-CM | POA: Diagnosis not present

## 2021-05-10 DIAGNOSIS — Z992 Dependence on renal dialysis: Secondary | ICD-10-CM | POA: Diagnosis not present

## 2021-05-10 DIAGNOSIS — N2581 Secondary hyperparathyroidism of renal origin: Secondary | ICD-10-CM | POA: Diagnosis not present

## 2021-05-10 DIAGNOSIS — N186 End stage renal disease: Secondary | ICD-10-CM | POA: Diagnosis not present

## 2021-05-14 DIAGNOSIS — N186 End stage renal disease: Secondary | ICD-10-CM | POA: Diagnosis not present

## 2021-05-14 DIAGNOSIS — Z992 Dependence on renal dialysis: Secondary | ICD-10-CM | POA: Diagnosis not present

## 2021-05-17 DIAGNOSIS — Z992 Dependence on renal dialysis: Secondary | ICD-10-CM | POA: Diagnosis not present

## 2021-05-17 DIAGNOSIS — N186 End stage renal disease: Secondary | ICD-10-CM | POA: Diagnosis not present

## 2021-05-21 DIAGNOSIS — N186 End stage renal disease: Secondary | ICD-10-CM | POA: Diagnosis not present

## 2021-05-21 DIAGNOSIS — Z992 Dependence on renal dialysis: Secondary | ICD-10-CM | POA: Diagnosis not present

## 2021-05-24 DIAGNOSIS — N186 End stage renal disease: Secondary | ICD-10-CM | POA: Diagnosis not present

## 2021-05-24 DIAGNOSIS — E119 Type 2 diabetes mellitus without complications: Secondary | ICD-10-CM | POA: Diagnosis not present

## 2021-05-24 DIAGNOSIS — Z992 Dependence on renal dialysis: Secondary | ICD-10-CM | POA: Diagnosis not present

## 2021-05-24 DIAGNOSIS — D509 Iron deficiency anemia, unspecified: Secondary | ICD-10-CM | POA: Diagnosis not present

## 2021-05-28 DIAGNOSIS — Z992 Dependence on renal dialysis: Secondary | ICD-10-CM | POA: Diagnosis not present

## 2021-05-28 DIAGNOSIS — N186 End stage renal disease: Secondary | ICD-10-CM | POA: Diagnosis not present

## 2021-05-31 DIAGNOSIS — N186 End stage renal disease: Secondary | ICD-10-CM | POA: Diagnosis not present

## 2021-05-31 DIAGNOSIS — Z992 Dependence on renal dialysis: Secondary | ICD-10-CM | POA: Diagnosis not present

## 2021-06-01 DIAGNOSIS — N186 End stage renal disease: Secondary | ICD-10-CM | POA: Diagnosis not present

## 2021-06-01 DIAGNOSIS — Z992 Dependence on renal dialysis: Secondary | ICD-10-CM | POA: Diagnosis not present

## 2021-06-04 DIAGNOSIS — N186 End stage renal disease: Secondary | ICD-10-CM | POA: Diagnosis not present

## 2021-06-04 DIAGNOSIS — Z992 Dependence on renal dialysis: Secondary | ICD-10-CM | POA: Diagnosis not present

## 2021-06-07 DIAGNOSIS — N186 End stage renal disease: Secondary | ICD-10-CM | POA: Diagnosis not present

## 2021-06-07 DIAGNOSIS — Z992 Dependence on renal dialysis: Secondary | ICD-10-CM | POA: Diagnosis not present

## 2021-06-09 DIAGNOSIS — N186 End stage renal disease: Secondary | ICD-10-CM | POA: Diagnosis not present

## 2021-06-09 DIAGNOSIS — Z992 Dependence on renal dialysis: Secondary | ICD-10-CM | POA: Diagnosis not present

## 2021-06-11 DIAGNOSIS — Z992 Dependence on renal dialysis: Secondary | ICD-10-CM | POA: Diagnosis not present

## 2021-06-11 DIAGNOSIS — N186 End stage renal disease: Secondary | ICD-10-CM | POA: Diagnosis not present

## 2021-06-14 DIAGNOSIS — Z992 Dependence on renal dialysis: Secondary | ICD-10-CM | POA: Diagnosis not present

## 2021-06-14 DIAGNOSIS — N186 End stage renal disease: Secondary | ICD-10-CM | POA: Diagnosis not present

## 2021-06-16 DIAGNOSIS — Z992 Dependence on renal dialysis: Secondary | ICD-10-CM | POA: Diagnosis not present

## 2021-06-16 DIAGNOSIS — N2581 Secondary hyperparathyroidism of renal origin: Secondary | ICD-10-CM | POA: Diagnosis not present

## 2021-06-16 DIAGNOSIS — N186 End stage renal disease: Secondary | ICD-10-CM | POA: Diagnosis not present

## 2021-06-18 DIAGNOSIS — Z992 Dependence on renal dialysis: Secondary | ICD-10-CM | POA: Diagnosis not present

## 2021-06-18 DIAGNOSIS — N186 End stage renal disease: Secondary | ICD-10-CM | POA: Diagnosis not present

## 2021-06-21 DIAGNOSIS — N186 End stage renal disease: Secondary | ICD-10-CM | POA: Diagnosis not present

## 2021-06-21 DIAGNOSIS — Z992 Dependence on renal dialysis: Secondary | ICD-10-CM | POA: Diagnosis not present

## 2021-06-25 DIAGNOSIS — N186 End stage renal disease: Secondary | ICD-10-CM | POA: Diagnosis not present

## 2021-06-25 DIAGNOSIS — Z992 Dependence on renal dialysis: Secondary | ICD-10-CM | POA: Diagnosis not present

## 2021-06-28 DIAGNOSIS — Z992 Dependence on renal dialysis: Secondary | ICD-10-CM | POA: Diagnosis not present

## 2021-06-28 DIAGNOSIS — N186 End stage renal disease: Secondary | ICD-10-CM | POA: Diagnosis not present

## 2021-06-29 DIAGNOSIS — N186 End stage renal disease: Secondary | ICD-10-CM | POA: Diagnosis not present

## 2021-06-29 DIAGNOSIS — Z992 Dependence on renal dialysis: Secondary | ICD-10-CM | POA: Diagnosis not present

## 2021-06-30 DIAGNOSIS — N186 End stage renal disease: Secondary | ICD-10-CM | POA: Diagnosis not present

## 2021-06-30 DIAGNOSIS — Z992 Dependence on renal dialysis: Secondary | ICD-10-CM | POA: Diagnosis not present

## 2021-07-02 DIAGNOSIS — Z992 Dependence on renal dialysis: Secondary | ICD-10-CM | POA: Diagnosis not present

## 2021-07-02 DIAGNOSIS — N186 End stage renal disease: Secondary | ICD-10-CM | POA: Diagnosis not present

## 2021-07-05 DIAGNOSIS — N186 End stage renal disease: Secondary | ICD-10-CM | POA: Diagnosis not present

## 2021-07-05 DIAGNOSIS — Z992 Dependence on renal dialysis: Secondary | ICD-10-CM | POA: Diagnosis not present

## 2021-07-07 DIAGNOSIS — Z992 Dependence on renal dialysis: Secondary | ICD-10-CM | POA: Diagnosis not present

## 2021-07-07 DIAGNOSIS — N186 End stage renal disease: Secondary | ICD-10-CM | POA: Diagnosis not present

## 2021-07-09 DIAGNOSIS — N186 End stage renal disease: Secondary | ICD-10-CM | POA: Diagnosis not present

## 2021-07-09 DIAGNOSIS — Z992 Dependence on renal dialysis: Secondary | ICD-10-CM | POA: Diagnosis not present

## 2021-07-12 DIAGNOSIS — N186 End stage renal disease: Secondary | ICD-10-CM | POA: Diagnosis not present

## 2021-07-12 DIAGNOSIS — Z992 Dependence on renal dialysis: Secondary | ICD-10-CM | POA: Diagnosis not present

## 2021-07-14 DIAGNOSIS — N186 End stage renal disease: Secondary | ICD-10-CM | POA: Diagnosis not present

## 2021-07-14 DIAGNOSIS — Z992 Dependence on renal dialysis: Secondary | ICD-10-CM | POA: Diagnosis not present

## 2021-07-16 DIAGNOSIS — N186 End stage renal disease: Secondary | ICD-10-CM | POA: Diagnosis not present

## 2021-07-16 DIAGNOSIS — Z992 Dependence on renal dialysis: Secondary | ICD-10-CM | POA: Diagnosis not present

## 2021-07-19 DIAGNOSIS — Z992 Dependence on renal dialysis: Secondary | ICD-10-CM | POA: Diagnosis not present

## 2021-07-19 DIAGNOSIS — N186 End stage renal disease: Secondary | ICD-10-CM | POA: Diagnosis not present

## 2021-07-21 ENCOUNTER — Other Ambulatory Visit: Payer: Self-pay | Admitting: Family Medicine

## 2021-07-21 DIAGNOSIS — J309 Allergic rhinitis, unspecified: Secondary | ICD-10-CM

## 2021-07-21 DIAGNOSIS — N186 End stage renal disease: Secondary | ICD-10-CM | POA: Diagnosis not present

## 2021-07-21 DIAGNOSIS — Z992 Dependence on renal dialysis: Secondary | ICD-10-CM | POA: Diagnosis not present

## 2021-07-23 DIAGNOSIS — N186 End stage renal disease: Secondary | ICD-10-CM | POA: Diagnosis not present

## 2021-07-23 DIAGNOSIS — Z992 Dependence on renal dialysis: Secondary | ICD-10-CM | POA: Diagnosis not present

## 2021-07-26 DIAGNOSIS — Z992 Dependence on renal dialysis: Secondary | ICD-10-CM | POA: Diagnosis not present

## 2021-07-26 DIAGNOSIS — N186 End stage renal disease: Secondary | ICD-10-CM | POA: Diagnosis not present

## 2021-07-28 DIAGNOSIS — N186 End stage renal disease: Secondary | ICD-10-CM | POA: Diagnosis not present

## 2021-07-28 DIAGNOSIS — Z992 Dependence on renal dialysis: Secondary | ICD-10-CM | POA: Diagnosis not present

## 2021-07-30 DIAGNOSIS — N186 End stage renal disease: Secondary | ICD-10-CM | POA: Diagnosis not present

## 2021-07-30 DIAGNOSIS — Z992 Dependence on renal dialysis: Secondary | ICD-10-CM | POA: Diagnosis not present

## 2021-08-02 DIAGNOSIS — N186 End stage renal disease: Secondary | ICD-10-CM | POA: Diagnosis not present

## 2021-08-02 DIAGNOSIS — Z992 Dependence on renal dialysis: Secondary | ICD-10-CM | POA: Diagnosis not present

## 2021-08-03 ENCOUNTER — Other Ambulatory Visit: Payer: Self-pay | Admitting: Family Medicine

## 2021-08-03 NOTE — Telephone Encounter (Signed)
Medication Refill - Medication: torsemide (DEMADEX) 100 MG tablet ? ?Has the patient contacted their pharmacy? Yes.   ?Pt was put on this medication at hospital stay.   ? ?Preferred Pharmacy (with phone number or street name): [id number] - [place], [place] - [address] ? ?Has the patient been seen for an appointment in the last year OR does the patient have an upcoming appointment? Yes.   ? ?Agent: Please be advised that RX refills may take up to 3 business days. We ask that you follow-up with your pharmacy. ? ?

## 2021-08-04 DIAGNOSIS — Z992 Dependence on renal dialysis: Secondary | ICD-10-CM | POA: Diagnosis not present

## 2021-08-04 DIAGNOSIS — N186 End stage renal disease: Secondary | ICD-10-CM | POA: Diagnosis not present

## 2021-08-04 NOTE — Telephone Encounter (Signed)
Requested medication (s) are due for refill today:   No ? ?Requested medication (s) are on the active medication list:   Yes but as a historical med. ? ?Future visit scheduled:   No ? ? ?Last ordered: Not sure ? ?Returned because prescribed by a historical provider a year ago.     ? ?Requested Prescriptions  ?Pending Prescriptions Disp Refills  ? torsemide (DEMADEX) 100 MG tablet    ?  Sig: Take 1 tablet (100 mg total) by mouth daily.  ?  ? Cardiovascular:  Diuretics - Loop Failed - 08/03/2021  4:01 PM  ?  ?  Failed - K in normal range and within 180 days  ?  Potassium  ?Date Value Ref Range Status  ?09/15/2020 4.4 3.5 - 5.2 mmol/L Final  ?05/15/2013 4.0 3.5 - 5.1 mmol/L Final  ? ?Potassium [facility] vascular lab)  ?Date Value Ref Range Status  ?01/15/2020 3.7 3.5 - 5.1 Final  ?  Comment:  ?  Performed at [facility], [address], [place], [place] [address]  ?  ?  ?  ?  Failed - Ca in normal range and within 180 days  ?  Calcium  ?Date Value Ref Range Status  ?09/15/2020 10.1 8.7 - 10.3 mg/dL Final  ? ?Calcium, Total  ?Date Value Ref Range Status  ?05/15/2013 8.0 (L) 8.5 - 10.1 mg/dL Final  ? ?Calcium, Ion  ?Date Value Ref Range Status  ?11/13/2019 1.35 1.15 - 1.40 mmol/L Final  ?  ?  ?  ?  Failed - Na in normal range and within 180 days  ?  Sodium  ?Date Value Ref Range Status  ?09/15/2020 143 134 - 144 mmol/L Final  ?05/15/2013 132 (L) 136 - 145 mmol/L Final  ?  ?  ?  ?  Failed - Cr in normal range and within 180 days  ?  Creatinine  ?Date Value Ref Range Status  ?05/15/2013 2.27 (H) 0.60 - 1.30 mg/dL Final  ? ?Creatinine, Ser  ?Date Value Ref Range Status  ?09/15/2020 2.37 (H) 0.57 - 1.00 mg/dL Final  ?  ?  ?  ?  Failed - Cl in normal range and within 180 days  ?  Chloride  ?Date Value Ref Range Status  ?09/15/2020 101 96 - 106 mmol/L Final  ?05/15/2013 99 98 - 107 mmol/L Final  ?  ?  ?  ?  Failed - Mg Level in normal range and within 180 days  ?  Magnesium  ?Date Value Ref Range Status  ?07/13/2020 2.1  1.7 - 2.4 mg/dL Final  ?  Comment:  ?  Performed at [facility], [address] [place], [place] [address]  ?  ?  ?  ?  Passed - Last BP in normal range  ?  BP Readings from Last 1 Encounters:  ?03/16/21 (!) 105/53  ?  ?  ?  ?  Passed - Valid encounter within last 6 months  ?  Recent Outpatient Visits   ? ?      ? 4 months ago Type 2 diabetes mellitus with hyperlipidemia ([place])  ? [facility] Jerrol Banana., MD  ? 7 months ago Type 2 diabetes mellitus with hyperlipidemia [facility])  ? [facility] Jerrol Banana., MD  ? 10 months ago Annual physical exam  ? [facility] Jerrol Banana., MD  ? 1 year ago Essential hypertension  ? [facility] Jerrol Banana., MD  ?  1 year ago Need for immunization against influenza  ? [facility] Jerrol Banana., MD  ? ?  ?  ? ?  ?  ?  ? ?

## 2021-08-04 NOTE — Progress Notes (Signed)
? ?I,Pamela Intrieri,acting as a scribe for [employer], PA-C.,have documented all relevant documentation on the behalf of Mikey Kirschner, PA-C,as directed by  Mikey Kirschner, PA-C while in the presence of Mikey Kirschner, PA-C. ? ?Virtual telephone visit ? ? ? ?Virtual Visit via Telephone Note  ? ?This visit type was conducted due to national recommendations for restrictions regarding the COVID-19 Pandemic (e.g. social distancing) in an effort to limit this patient's exposure and mitigate transmission in our community. Due to her co-morbid illnesses, this patient is at least at moderate risk for complications without adequate follow up. This format is felt to be most appropriate for this patient at this time. The patient did not have access to video technology or had technical difficulties with video requiring transitioning to audio format only (telephone). Physical exam was limited to content and character of the telephone converstion.   ? ?Patient location: home  ?Provider location: [facility] ? ?I discussed the limitations of evaluation and management by telemedicine and the availability of in person appointments. The patient expressed understanding and agreed to proceed.  ? ?Visit Date: 08/05/2021 ? ?Today's healthcare provider: Mikey Kirschner, PA-C  ? ?Cc. hoarseness ? ?Subjective  ?  ?HPI  ? ?Patient reports hoarseness off and on for more than a month. Reports taking only prescribed medications and says that it gets better sometimes. Believes it could be due to weather changes but not sure. Has not come in contact with anyone with strep that she is aware of and no soreness in throat to report or any other related symptoms. Reports cough in AM. Denies fevers, chills, trouble swallowing, sore throat.  ?  ?Medications: ?Outpatient Medications Prior to Visit  ?Medication Sig  ? acetaminophen (TYLENOL) 500 MG tablet Take 500-1,000 mg by mouth every 6 (six) hours as needed for mild pain or fever.    ? albuterol (VENTOLIN HFA) 108 (90 Base) MCG/ACT inhaler TAKE 2 PUFFS BY MOUTH EVERY 6 HOURS AS NEEDED FOR WHEEZE OR SHORTNESS OF BREATH (Patient taking differently: Inhale 2 puffs into the lungs every 6 (six) hours as needed for wheezing or shortness of breath.)  ? amLODipine (NORVASC) 5 MG tablet Take 1 tablet (5 mg total) by mouth daily.  ? Bepotastine Besilate 1.5 % SOLN Place 1 drop into both eyes in the morning and at bedtime. Morning & afternoon.  ? brimonidine (ALPHAGAN) 0.2 % ophthalmic solution Place 1 drop into both eyes 2 (two) times daily.   ? calcitRIOL (ROCALTROL) 0.25 MCG capsule Take 0.25 mcg by mouth every Monday, Wednesday, and Friday.   ? carvedilol (COREG) 12.5 MG tablet TAKE 1 TABLET BY MOUTH TWICE A DAY  ? [facility] ASPIRIN ADULT LOW DOSE 81 MG chewable tablet CHEW 1 TABLET (81 MG TOTAL) BY MOUTH DAILY.  ? dorzolamide-timolol (COSOPT) 22.3-6.8 MG/ML ophthalmic solution Place 1 drop into both eyes 2 (two) times daily.   ? latanoprost (XALATAN) 0.005 % ophthalmic solution Place 1 drop into both eyes at bedtime.  ? loratadine (CLARITIN) 10 MG tablet TAKE 1 TABLET BY MOUTH EVERY DAY  ? multivitamin (RENA-VIT) TABS tablet Take 1 tablet by mouth daily.   ? OXYGEN Inhale 2 L into the lungs at bedtime.  ? sertraline (ZOLOFT) 25 MG tablet TAKE 1 TABLET BY MOUTH EVERY DAY  ? torsemide (DEMADEX) 100 MG tablet Take 100 mg by mouth daily.  ? traZODone (DESYREL) 150 MG tablet TAKE 1 TABLET BY MOUTH AT BEDTIME.  ? ?No facility-administered medications prior to visit.  ? ? ?  Review of Systems  ?Constitutional:  Negative for fatigue and fever.  ?HENT:  Positive for voice change. Negative for sore throat.   ?Respiratory:  Negative for cough and shortness of breath.   ?Cardiovascular:  Negative for chest pain and leg swelling.  ?Gastrointestinal:  Negative for abdominal pain.  ?Neurological:  Negative for dizziness and headaches.  ? ? Objective  ?  ?There were no vitals taken for this visit. ? ? ? Assessment & Plan  ?   ? ?Hoarseness ?May be secondary to seasonal allergies, difficult to tell over the phone ?Rx flonase, to use daily. ?If no improvement recommend come into the office ? ?Return if symptoms worsen or fail to improve. ?  ? ?I discussed the assessment and treatment plan with the patient. The patient was provided an opportunity to ask questions and all were answered. The patient agreed with the plan and demonstrated an understanding of the instructions. ?  ?The patient was advised to call back or seek an in-person evaluation if the symptoms worsen or if the condition fails to improve as anticipated. ? ?I provided 10 minutes of non-face-to-face time during this encounter. ? ?I, Mikey Kirschner, PA-C have reviewed all documentation for this visit. The documentation on  08/05/2021  for the exam, diagnosis, procedures, and orders are all accurate and complete. ? ?Mikey Kirschner, PA-C ?[facility] [id number] ?[place], [place], [address] ?Office: [phone] ?Fax: [phone]  ? ?[facility]  ? ?

## 2021-08-05 ENCOUNTER — Ambulatory Visit (INDEPENDENT_AMBULATORY_CARE_PROVIDER_SITE_OTHER): Payer: Medicare Other | Admitting: Physician Assistant

## 2021-08-05 ENCOUNTER — Encounter: Payer: Self-pay | Admitting: Physician Assistant

## 2021-08-05 DIAGNOSIS — R490 Dysphonia: Secondary | ICD-10-CM

## 2021-08-05 MED ORDER — FLUTICASONE PROPIONATE 50 MCG/ACT NA SUSP: 1.0000 | Freq: Every day | NASAL | 2 refills | Status: DC

## 2021-08-06 DIAGNOSIS — Z992 Dependence on renal dialysis: Secondary | ICD-10-CM | POA: Diagnosis not present

## 2021-08-06 DIAGNOSIS — N186 End stage renal disease: Secondary | ICD-10-CM | POA: Diagnosis not present

## 2021-08-06 MED ORDER — TORSEMIDE 100 MG PO TABS: 100.0000 mg | ORAL_TABLET | Freq: Every day | ORAL | 0 refills | Status: DC

## 2021-08-09 DIAGNOSIS — N186 End stage renal disease: Secondary | ICD-10-CM | POA: Diagnosis not present

## 2021-08-09 DIAGNOSIS — Z992 Dependence on renal dialysis: Secondary | ICD-10-CM | POA: Diagnosis not present

## 2021-08-11 DIAGNOSIS — N186 End stage renal disease: Secondary | ICD-10-CM | POA: Diagnosis not present

## 2021-08-11 DIAGNOSIS — Z992 Dependence on renal dialysis: Secondary | ICD-10-CM | POA: Diagnosis not present

## 2021-08-12 DIAGNOSIS — L851 Acquired keratosis [keratoderma] palmaris et plantaris: Secondary | ICD-10-CM | POA: Diagnosis not present

## 2021-08-12 DIAGNOSIS — E119 Type 2 diabetes mellitus without complications: Secondary | ICD-10-CM | POA: Diagnosis not present

## 2021-08-12 DIAGNOSIS — B351 Tinea unguium: Secondary | ICD-10-CM | POA: Diagnosis not present

## 2021-08-13 DIAGNOSIS — Z992 Dependence on renal dialysis: Secondary | ICD-10-CM | POA: Diagnosis not present

## 2021-08-13 DIAGNOSIS — N186 End stage renal disease: Secondary | ICD-10-CM | POA: Diagnosis not present

## 2021-08-16 DIAGNOSIS — Z992 Dependence on renal dialysis: Secondary | ICD-10-CM | POA: Diagnosis not present

## 2021-08-16 DIAGNOSIS — N186 End stage renal disease: Secondary | ICD-10-CM | POA: Diagnosis not present

## 2021-08-18 DIAGNOSIS — Z992 Dependence on renal dialysis: Secondary | ICD-10-CM | POA: Diagnosis not present

## 2021-08-18 DIAGNOSIS — N186 End stage renal disease: Secondary | ICD-10-CM | POA: Diagnosis not present

## 2021-08-20 DIAGNOSIS — N186 End stage renal disease: Secondary | ICD-10-CM | POA: Diagnosis not present

## 2021-08-20 DIAGNOSIS — Z992 Dependence on renal dialysis: Secondary | ICD-10-CM | POA: Diagnosis not present

## 2021-08-23 DIAGNOSIS — E1122 Type 2 diabetes mellitus with diabetic chronic kidney disease: Secondary | ICD-10-CM | POA: Diagnosis not present

## 2021-08-23 DIAGNOSIS — N186 End stage renal disease: Secondary | ICD-10-CM | POA: Diagnosis not present

## 2021-08-23 DIAGNOSIS — Z992 Dependence on renal dialysis: Secondary | ICD-10-CM | POA: Diagnosis not present

## 2021-08-25 DIAGNOSIS — Z992 Dependence on renal dialysis: Secondary | ICD-10-CM | POA: Diagnosis not present

## 2021-08-25 DIAGNOSIS — N186 End stage renal disease: Secondary | ICD-10-CM | POA: Diagnosis not present

## 2021-08-27 DIAGNOSIS — N186 End stage renal disease: Secondary | ICD-10-CM | POA: Diagnosis not present

## 2021-08-27 DIAGNOSIS — Z992 Dependence on renal dialysis: Secondary | ICD-10-CM | POA: Diagnosis not present

## 2021-08-29 DIAGNOSIS — N186 End stage renal disease: Secondary | ICD-10-CM | POA: Diagnosis not present

## 2021-08-29 DIAGNOSIS — Z992 Dependence on renal dialysis: Secondary | ICD-10-CM | POA: Diagnosis not present

## 2021-08-30 DIAGNOSIS — Z992 Dependence on renal dialysis: Secondary | ICD-10-CM | POA: Diagnosis not present

## 2021-08-30 DIAGNOSIS — N186 End stage renal disease: Secondary | ICD-10-CM | POA: Diagnosis not present

## 2021-09-01 DIAGNOSIS — N186 End stage renal disease: Secondary | ICD-10-CM | POA: Diagnosis not present

## 2021-09-01 DIAGNOSIS — Z992 Dependence on renal dialysis: Secondary | ICD-10-CM | POA: Diagnosis not present

## 2021-09-03 DIAGNOSIS — Z992 Dependence on renal dialysis: Secondary | ICD-10-CM | POA: Diagnosis not present

## 2021-09-03 DIAGNOSIS — N186 End stage renal disease: Secondary | ICD-10-CM | POA: Diagnosis not present

## 2021-09-06 DIAGNOSIS — N186 End stage renal disease: Secondary | ICD-10-CM | POA: Diagnosis not present

## 2021-09-06 DIAGNOSIS — Z992 Dependence on renal dialysis: Secondary | ICD-10-CM | POA: Diagnosis not present

## 2021-09-08 DIAGNOSIS — N186 End stage renal disease: Secondary | ICD-10-CM | POA: Diagnosis not present

## 2021-09-08 DIAGNOSIS — Z992 Dependence on renal dialysis: Secondary | ICD-10-CM | POA: Diagnosis not present

## 2021-09-10 DIAGNOSIS — Z992 Dependence on renal dialysis: Secondary | ICD-10-CM | POA: Diagnosis not present

## 2021-09-10 DIAGNOSIS — N186 End stage renal disease: Secondary | ICD-10-CM | POA: Diagnosis not present

## 2021-09-13 DIAGNOSIS — Z992 Dependence on renal dialysis: Secondary | ICD-10-CM | POA: Diagnosis not present

## 2021-09-13 DIAGNOSIS — N186 End stage renal disease: Secondary | ICD-10-CM | POA: Diagnosis not present

## 2021-09-15 DIAGNOSIS — N186 End stage renal disease: Secondary | ICD-10-CM | POA: Diagnosis not present

## 2021-09-15 DIAGNOSIS — Z992 Dependence on renal dialysis: Secondary | ICD-10-CM | POA: Diagnosis not present

## 2021-09-17 DIAGNOSIS — Z992 Dependence on renal dialysis: Secondary | ICD-10-CM | POA: Diagnosis not present

## 2021-09-17 DIAGNOSIS — N186 End stage renal disease: Secondary | ICD-10-CM | POA: Diagnosis not present

## 2021-09-20 DIAGNOSIS — N186 End stage renal disease: Secondary | ICD-10-CM | POA: Diagnosis not present

## 2021-09-20 DIAGNOSIS — Z992 Dependence on renal dialysis: Secondary | ICD-10-CM | POA: Diagnosis not present

## 2021-09-21 ENCOUNTER — Ambulatory Visit (INDEPENDENT_AMBULATORY_CARE_PROVIDER_SITE_OTHER): Payer: Medicare Other | Admitting: Family Medicine

## 2021-09-21 ENCOUNTER — Encounter: Payer: Self-pay | Admitting: Family Medicine

## 2021-09-21 VITALS — BP 152/59 | HR 79 | Resp 16 | Ht 63.0 in | Wt 111.5 lb

## 2021-09-21 DIAGNOSIS — G8929 Other chronic pain: Secondary | ICD-10-CM

## 2021-09-21 DIAGNOSIS — I10 Essential (primary) hypertension: Secondary | ICD-10-CM | POA: Diagnosis not present

## 2021-09-21 DIAGNOSIS — M25562 Pain in left knee: Secondary | ICD-10-CM

## 2021-09-21 DIAGNOSIS — E785 Hyperlipidemia, unspecified: Secondary | ICD-10-CM

## 2021-09-21 DIAGNOSIS — M25561 Pain in right knee: Secondary | ICD-10-CM | POA: Diagnosis not present

## 2021-09-21 DIAGNOSIS — E1169 Type 2 diabetes mellitus with other specified complication: Secondary | ICD-10-CM | POA: Diagnosis not present

## 2021-09-21 DIAGNOSIS — N185 Chronic kidney disease, stage 5: Secondary | ICD-10-CM

## 2021-09-21 DIAGNOSIS — I5032 Chronic diastolic (congestive) heart failure: Secondary | ICD-10-CM

## 2021-09-21 DIAGNOSIS — D631 Anemia in chronic kidney disease: Secondary | ICD-10-CM | POA: Diagnosis not present

## 2021-09-21 DIAGNOSIS — Z0000 Encounter for general adult medical examination without abnormal findings: Secondary | ICD-10-CM

## 2021-09-21 NOTE — Progress Notes (Signed)
Annual Wellness Visit     Patient: Lori Stanley, Female    DOB: July 25, 1931, 88 y.o.   MRN: [id number] Visit Date: 09/21/2021  Today's Provider: [name], MD   Chief Complaint  Patient presents with   Medicare Wellness   Subjective    Lori Stanley is a 88 y.o. female who presents today for her Annual Wellness Visit.Annual Physical. She reports consuming a general and low sodium diet. The patient does not participate in regular exercise at present. She generally feels fairly well. She reports sleeping fairly well. She does not have additional problems to discuss today.  She continues to live independently.  She is on Monday Wednesday Friday hemodialysis.  She does not like this.  She has chronic knee pain and wonders if Dr. Sabra Heck could help her in any way.  Her only complaint today is 1 of left ear pain.  HPI    Medications: Outpatient Medications Prior to Visit  Medication Sig   acetaminophen (TYLENOL) 500 MG tablet Take 500-1,000 mg by mouth every 6 (six) hours as needed for mild pain or fever.    albuterol (VENTOLIN HFA) 108 (90 Base) MCG/ACT inhaler TAKE 2 PUFFS BY MOUTH EVERY 6 HOURS AS NEEDED FOR WHEEZE OR SHORTNESS OF BREATH (Patient taking differently: Inhale 2 puffs into the lungs every 6 (six) hours as needed for wheezing or shortness of breath.)   Bepotastine Besilate 1.5 % SOLN Place 1 drop into both eyes in the morning and at bedtime. Morning & afternoon.   brimonidine (ALPHAGAN) 0.2 % ophthalmic solution Place 1 drop into both eyes 2 (two) times daily.    calcitRIOL (ROCALTROL) 0.25 MCG capsule Take 0.25 mcg by mouth every Monday, Wednesday, and Friday.    carvedilol (COREG) 12.5 MG tablet TAKE 1 TABLET BY MOUTH TWICE A DAY   dorzolamide-timolol (COSOPT) 22.3-6.8 MG/ML ophthalmic solution Place 1 drop into both eyes 2 (two) times daily.    fluticasone (FLONASE) 50 MCG/ACT nasal spray Place 1 spray into both nostrils daily.   latanoprost  (XALATAN) 0.005 % ophthalmic solution Place 1 drop into both eyes at bedtime.   loratadine (CLARITIN) 10 MG tablet TAKE 1 TABLET BY MOUTH EVERY DAY   multivitamin (RENA-VIT) TABS tablet Take 1 tablet by mouth daily.    OXYGEN Inhale 2 L into the lungs at bedtime.   sertraline (ZOLOFT) 25 MG tablet TAKE 1 TABLET BY MOUTH EVERY DAY   torsemide (DEMADEX) 100 MG tablet Take 1 tablet (100 mg total) by mouth daily.   traZODone (DESYREL) 150 MG tablet TAKE 1 TABLET BY MOUTH AT BEDTIME.   [DISCONTINUED] amLODipine (NORVASC) 5 MG tablet Take 1 tablet (5 mg total) by mouth daily. (Patient not taking: Reported on 09/21/2021)   [DISCONTINUED] CVS ASPIRIN ADULT LOW DOSE 81 MG chewable tablet CHEW 1 TABLET (81 MG TOTAL) BY MOUTH DAILY. (Patient not taking: Reported on 09/21/2021)   No facility-administered medications prior to visit.    Allergies  Allergen Reactions   Antihistamines, Chlorpheniramine-Type Other (See Comments)    Unknown reaction.   Paxil [Paroxetine]     "makes her feel funny" not in a good way   Codeine Rash and Other (See Comments)    Mouth sore    Patient Care Team: Jerrol Banana., MD as PCP - General (Family Medicine) Wellington Hampshire, MD as PCP - Cardiology (Cardiology) Sharlotte Alamo, DPM (Podiatry) Hayden Pedro, MD as Consulting Physician (Ophthalmology) Lavonia Dana, MD as Consulting Physician (  Internal Medicine) Odette Fraction (Optometry) Cammie Sickle, MD as Consulting Physician (Internal Medicine)  Review of Systems  All other systems reviewed and are negative.      Objective    Vitals: BP (!) 152/59 (BP Location: Left Arm, Patient Position: Sitting, Cuff Size: Small)   Pulse 79   Resp 16   Ht 5\' 3"  (1.6 m)   Wt 111 lb 8 oz (50.6 kg)   SpO2 99%   BMI 19.75 kg/m     Physical Exam Vitals reviewed.  HENT:     Head: Normocephalic and atraumatic.     Right Ear: Ear canal and external ear normal.     Left Ear: External ear normal.      Ears:     Comments: Left EAC blocked with cerumen    Mouth/Throat:     Pharynx: Oropharynx is clear.  Eyes:     General: No scleral icterus.    Conjunctiva/sclera: Conjunctivae normal.  Cardiovascular:     Rate and Rhythm: Normal rate and regular rhythm.     Heart sounds: Murmur heard.    No friction rub. No gallop.  Pulmonary:     Effort: No respiratory distress.     Breath sounds: Normal breath sounds.  Abdominal:     Palpations: Abdomen is soft.  Musculoskeletal:     Right lower leg: Edema present.     Left lower leg: Edema present.     Comments: Chronic 1+ LE edema.  Skin:    General: Skin is warm and dry.  Neurological:     General: No focal deficit present.     Mental Status: She is alert and oriented to person, place, and time.  Psychiatric:        Mood and Affect: Mood normal.        Behavior: Behavior normal.        Thought Content: Thought content normal.        Judgment: Judgment normal.     Most recent functional status assessment:    09/21/2021    9:45 AM  In your present state of health, do you have any difficulty performing the following activities:  Hearing? 1  Vision? 1  Difficulty concentrating or making decisions? 0  Walking or climbing stairs? 1  Dressing or bathing? 0  Doing errands, shopping? 1   Most recent fall risk assessment:    09/21/2021    9:45 AM  Fall Risk   Falls in the past year? 1  Number falls in past yr: 1  Injury with Fall? 1  Risk for fall due to : Impaired balance/gait;Impaired mobility;Impaired vision;Orthopedic patient  Follow up Falls evaluation completed    Most recent depression screenings:    09/21/2021    9:45 AM 05/20/2020    1:48 PM  PHQ 2/9 Scores  PHQ - 2 Score 0 4  PHQ- 9 Score 2 4   Most recent cognitive screening:    05/23/2019    9:38 AM  6CIT Screen  What Year? 0 points  What month? 0 points  What time? 0 points  Count back from 20 0 points  Months in reverse 4 points  Repeat phrase 6  points  Total Score 10 points   Most recent Audit-C alcohol use screening    05/27/2020   10:22 AM  Alcohol Use Disorder Test (AUDIT)  1. How often do you have a drink containing alcohol? 0  2. How many drinks containing alcohol do you have on a  typical day when you are drinking? 0  3. How often do you have six or more drinks on one occasion? 0  AUDIT-C Score 0  Alcohol Brief Interventions/Follow-up AUDIT Score <7 follow-up not indicated   A score of 3 or more in women, and 4 or more in men indicates increased risk for alcohol abuse, EXCEPT if all of the points are from question 1   No results found for any visits on 09/21/21.  Assessment & Plan     Annual wellness visit done today including the all of the following: Reviewed patient's Family Medical History Reviewed and updated list of patient's medical providers Assessment of cognitive impairment was done Assessed patient's functional ability Established a written schedule for health screening [place] Completed and Reviewed  Exercise Activities and Dietary recommendations  Goals       Chronic Care Management      CARE PLAN ENTRY (see longitudinal plan of care for additional care plan information)  Current Barriers:  Chronic Disease Management support and education needs related HTN, CHF, DM, HLD and ESRD.  Case Manager Clinical Goal(s):  Over the next 120 days, patient will: Not require hospitalization or emergent care d/t complications r/t chronic illnesses. Take all medications as prescribed. Attend all medical appointments as scheduled. Monitor blood pressure and record readings. Follow recommended safety measures to prevent fall and injuries. Over the next 45 days, patient will: Schedule routine eye exam. Schedule routine podiatry visit.    Interventions:  Inter-disciplinary care team collaboration (see longitudinal plan of care) Discussed recent BP readings. Unable to recall exact  readings. Reports occasional low readings following dialysis but says most readings have been within range.  Reviewed s/sx of CHF related complications and discussed current activity tolerance. She continues to use supplemental oxygen at 2L/min during the night. Denies episodes of shortness of breath during the day. Denies chest discomfort, abdominal or lower extremity edema. Denies change/decline in activity tolerance.  Discussed plan for care management follow-up. Ms. Innes reports doing well and remains independent in the home. She required a new arteriovenous graft. The procedure was performed on 11/13/19. Reports episodes of shoulder pain but denies unrelieved pain or worsening symptoms that require immediate medical follow-up. Reports wearing her arm sleeve as advised. We thoroughly discussed her nutrition and importance of protein intake. She feels that she is doing well with maintaining a renal diet. She does recall days when her appetite is very limited d/t fatigue following dialysis. We discussed possible need to supplement with Nepro on these days vice skipping afternoon and evening meals. She agreed to discuss with her Nephrologist. Currently feels that she is doing well and denies urgent concerns or changes with care management needs.    Patient Self Care Activities:  Self administers medications  Attends scheduled provider appointments Calls pharmacy for medication refills Performs ADL's independently Performs IADL's independently   Please see past updates related to this goal by clicking on the "Past Updates" button in the selected goal        DIET - INCREASE WATER INTAKE      Recommend to drink at least 6-8 8oz glasses of water per day.       Expensive eye drops (pt-stated)      Current Barriers:  financial  Pharmacist Clinical Goal(s): Over the next 14 days, Ms.Oswaldo Milian will provide the necessary supplementary documents (proof of out of pocket prescription expenditure,  proof of household income) needed for medication assistance applications to CCM pharmacist.  Interventions: CCM pharmacist will investigate available programs or foundations for Ms. Daughtry's eye drops  Patient Self Care Activities:  Gather necessary documents needed to apply for medication assistance  Initial goal documentation        Pill Burden (pt-stated)       Current Barriers:  High pill burden Limited vision abilities  Pharmacist Clinical Goal(s): Over the next 30 days, Ms. Witherell will be adherent to all medications as evidenced by patient report. CCM clinical pharmacist will collaborate with prescribers to reduce medications if medically appropriate.   Interventions: Patient educated on purpose, proper use and potential adverse effects of medications. Contact Dr. Delton Prairie re: Ergocalciferol and labs Develop plan with Dr. [name] for coming off of insulin DC ASA 81mg   Make sure patient can read and visualize medications appropriately  Patient Self Care Activities:  Continue to take all medications as prescribed  Initial goal documentation          Immunization History  Administered Date(s) Administered   Fluad Quad(high Dose 65+) 01/29/2019, 01/14/2020, 01/27/2021   Hepatitis B, adult 12/27/2019, 03/30/2020, 04/03/2020   Influenza, High Dose Seasonal PF 02/05/2015, 01/16/2016, 12/26/2016, 02/13/2018   PFIZER(Purple Top)SARS-COV-2 Vaccination 08/07/2019, 08/28/2019, 05/20/2020   PPD Test 11/29/2019, 10/30/2020   Pneumococcal Conjugate-13 02/04/2014   Pneumococcal Polysaccharide-23 04/07/2002   Td 10/19/2016    Health Maintenance  Topic Date Due   URINE MICROALBUMIN  Never done   Zoster Vaccines- Shingrix (1 of 2) Never done   OPHTHALMOLOGY EXAM  10/04/2018   COVID-19 Vaccine (4 - Booster for Pfizer series) 07/15/2020   HEMOGLOBIN A1C  06/12/2021   INFLUENZA VACCINE  11/30/2021   FOOT EXAM  12/10/2021   TETANUS/TDAP  10/20/2026   Pneumonia  Vaccine 89+ Years old  Completed   DEXA SCAN  Completed   HPV VACCINES  Aged Out     Discussed health benefits of physical activity, and encouraged her to engage in regular exercise appropriate for her age and condition.    1. Encounter for Medicare annual wellness exam We will bring out most forms - Lipid panel - TSH - CBC w/Diff/Platelet - Comprehensive Metabolic Panel (CMET) - Hemoglobin A1c  2. Annual physical exam No more screening in this 88 year old  3. Essential hypertension  - Lipid panel - TSH - CBC w/Diff/Platelet - Comprehensive Metabolic Panel (CMET) - Hemoglobin A1c  4. Type 2 diabetes mellitus with hyperlipidemia (HCC) Goal A1c less than 8.5 to - Lipid panel - TSH - CBC w/Diff/Platelet - Comprehensive Metabolic Panel (CMET) - Hemoglobin A1c  5. Hyperlipidemia, unspecified hyperlipidemia type  - Lipid panel - TSH - CBC w/Diff/Platelet - Comprehensive Metabolic Panel (CMET) - Hemoglobin A1c  6. Anemia in stage 5 chronic kidney disease, not on chronic dialysis [facility]) On hemodialysis Monday Wednesday Friday - Lipid panel - TSH - CBC w/Diff/Platelet - Comprehensive Metabolic Panel (CMET) - Hemoglobin A1c  7. Chronic diastolic CHF (congestive heart failure) (HCC)  - Lipid panel - TSH - CBC w/Diff/Platelet - Comprehensive Metabolic Panel (CMET) - Hemoglobin A1c  8. Chronic kidney disease (CKD), stage V (HCC)  - Lipid panel - TSH - CBC w/Diff/Platelet - Comprehensive Metabolic Panel (CMET) - Hemoglobin A1c  9. Chronic pain of both knees Refer back to orthopedics to see if may be steroid shots or Synvisc might help her.  Told her knee brace might also be helpful - AMB referral to orthopedics  10.  Left ceruminosis Try Debrox to loosen the cerumen  Return in about 4 months (around 01/22/2022).  I, [name], MD, have reviewed all documentation for this visit. The documentation on 09/21/21 for the exam, diagnosis, procedures,  and orders are all accurate and complete.    Findley Blankenbaker Cranford Mon, MD  [facility] [phone] (phone) [phone] (fax)  [place]

## 2021-09-21 NOTE — Patient Instructions (Signed)
GET OVER-THE-COUNTER DEBROX TO SOFTEN EAR WAX.

## 2021-09-22 DIAGNOSIS — Z992 Dependence on renal dialysis: Secondary | ICD-10-CM | POA: Diagnosis not present

## 2021-09-22 DIAGNOSIS — N186 End stage renal disease: Secondary | ICD-10-CM | POA: Diagnosis not present

## 2021-09-22 LAB — CBC WITH DIFFERENTIAL/PLATELET
Basophils Absolute: 0 10*3/uL (ref 0.0–0.2)
Basos: 0 %
EOS (ABSOLUTE): 0.1 10*3/uL (ref 0.0–0.4)
Eos: 2 %
Hematocrit: 27.1 % — ABNORMAL LOW (ref 34.0–46.6)
Hemoglobin: 8.4 g/dL — ABNORMAL LOW (ref 11.1–15.9)
Immature Grans (Abs): 0 10*3/uL (ref 0.0–0.1)
Immature Granulocytes: 1 %
Lymphocytes Absolute: 1 10*3/uL (ref 0.7–3.1)
Lymphs: 23 %
MCH: 28.1 pg (ref 26.6–33.0)
MCHC: 31 g/dL — ABNORMAL LOW (ref 31.5–35.7)
MCV: 91 fL (ref 79–97)
Monocytes Absolute: 0.3 10*3/uL (ref 0.1–0.9)
Monocytes: 6 %
Neutrophils Absolute: 3.1 10*3/uL (ref 1.4–7.0)
Neutrophils: 68 %
Platelets: 314 10*3/uL (ref 150–450)
RBC: 2.99 x10E6/uL — ABNORMAL LOW (ref 3.77–5.28)
RDW: 13.7 % (ref 11.7–15.4)
WBC: 4.6 10*3/uL (ref 3.4–10.8)

## 2021-09-22 LAB — LIPID PANEL
Chol/HDL Ratio: 3.2 ratio (ref 0.0–4.4)
Cholesterol, Total: 197 mg/dL (ref 100–199)
HDL: 62 mg/dL (ref >39)
LDL Chol Calc (NIH): 123 mg/dL — ABNORMAL HIGH (ref 0–99)
Triglycerides: 63 mg/dL (ref 0–149)
VLDL Cholesterol Cal: 12 mg/dL (ref 5–40)

## 2021-09-22 LAB — COMPREHENSIVE METABOLIC PANEL
ALT: 12 IU/L (ref 0–32)
AST: 16 IU/L (ref 0–40)
Albumin/Globulin Ratio: 1.7 (ref 1.2–2.2)
Albumin: 4.1 g/dL (ref 3.6–4.6)
Alkaline Phosphatase: 134 IU/L — ABNORMAL HIGH (ref 44–121)
BUN/Creatinine Ratio: 10 — ABNORMAL LOW (ref 12–28)
BUN: 31 mg/dL — ABNORMAL HIGH (ref 8–27)
Bilirubin Total: <0.2 mg/dL (ref 0.0–1.2)
CO2: 24 mmol/L (ref 20–29)
Calcium: 10.4 mg/dL — ABNORMAL HIGH (ref 8.7–10.3)
Chloride: 99 mmol/L (ref 96–106)
Creatinine, Ser: 3.17 mg/dL — ABNORMAL HIGH (ref 0.57–1.00)
Globulin, Total: 2.4 g/dL (ref 1.5–4.5)
Glucose: 146 mg/dL — ABNORMAL HIGH (ref 70–99)
Potassium: 3.9 mmol/L (ref 3.5–5.2)
Sodium: 139 mmol/L (ref 134–144)
Total Protein: 6.5 g/dL (ref 6.0–8.5)
eGFR: 14 mL/min/{1.73_m2} — ABNORMAL LOW (ref >59)

## 2021-09-22 LAB — HEMOGLOBIN A1C
Est. average glucose Bld gHb Est-mCnc: 97 mg/dL
Hgb A1c MFr Bld: 5 % (ref 4.8–5.6)

## 2021-09-22 LAB — TSH: TSH: 2.84 u[IU]/mL (ref 0.450–4.500)

## 2021-09-24 DIAGNOSIS — Z992 Dependence on renal dialysis: Secondary | ICD-10-CM | POA: Diagnosis not present

## 2021-09-24 DIAGNOSIS — N186 End stage renal disease: Secondary | ICD-10-CM | POA: Diagnosis not present

## 2021-09-27 DIAGNOSIS — Z992 Dependence on renal dialysis: Secondary | ICD-10-CM | POA: Diagnosis not present

## 2021-09-27 DIAGNOSIS — N186 End stage renal disease: Secondary | ICD-10-CM | POA: Diagnosis not present

## 2021-09-29 DIAGNOSIS — N186 End stage renal disease: Secondary | ICD-10-CM | POA: Diagnosis not present

## 2021-09-29 DIAGNOSIS — Z992 Dependence on renal dialysis: Secondary | ICD-10-CM | POA: Diagnosis not present

## 2021-10-01 DIAGNOSIS — Z992 Dependence on renal dialysis: Secondary | ICD-10-CM | POA: Diagnosis not present

## 2021-10-01 DIAGNOSIS — N186 End stage renal disease: Secondary | ICD-10-CM | POA: Diagnosis not present

## 2021-10-04 DIAGNOSIS — N186 End stage renal disease: Secondary | ICD-10-CM | POA: Diagnosis not present

## 2021-10-04 DIAGNOSIS — Z992 Dependence on renal dialysis: Secondary | ICD-10-CM | POA: Diagnosis not present

## 2021-10-06 DIAGNOSIS — Z992 Dependence on renal dialysis: Secondary | ICD-10-CM | POA: Diagnosis not present

## 2021-10-06 DIAGNOSIS — N186 End stage renal disease: Secondary | ICD-10-CM | POA: Diagnosis not present

## 2021-10-08 DIAGNOSIS — N186 End stage renal disease: Secondary | ICD-10-CM | POA: Diagnosis not present

## 2021-10-08 DIAGNOSIS — Z992 Dependence on renal dialysis: Secondary | ICD-10-CM | POA: Diagnosis not present

## 2021-10-11 DIAGNOSIS — N186 End stage renal disease: Secondary | ICD-10-CM | POA: Diagnosis not present

## 2021-10-11 DIAGNOSIS — Z992 Dependence on renal dialysis: Secondary | ICD-10-CM | POA: Diagnosis not present

## 2021-10-13 DIAGNOSIS — Z992 Dependence on renal dialysis: Secondary | ICD-10-CM | POA: Diagnosis not present

## 2021-10-13 DIAGNOSIS — N186 End stage renal disease: Secondary | ICD-10-CM | POA: Diagnosis not present

## 2021-10-15 DIAGNOSIS — N186 End stage renal disease: Secondary | ICD-10-CM | POA: Diagnosis not present

## 2021-10-15 DIAGNOSIS — Z992 Dependence on renal dialysis: Secondary | ICD-10-CM | POA: Diagnosis not present

## 2021-10-18 DIAGNOSIS — Z992 Dependence on renal dialysis: Secondary | ICD-10-CM | POA: Diagnosis not present

## 2021-10-18 DIAGNOSIS — N186 End stage renal disease: Secondary | ICD-10-CM | POA: Diagnosis not present

## 2021-10-20 DIAGNOSIS — N186 End stage renal disease: Secondary | ICD-10-CM | POA: Diagnosis not present

## 2021-10-20 DIAGNOSIS — Z992 Dependence on renal dialysis: Secondary | ICD-10-CM | POA: Diagnosis not present

## 2021-10-22 DIAGNOSIS — Z992 Dependence on renal dialysis: Secondary | ICD-10-CM | POA: Diagnosis not present

## 2021-10-22 DIAGNOSIS — N186 End stage renal disease: Secondary | ICD-10-CM | POA: Diagnosis not present

## 2021-10-25 DIAGNOSIS — N186 End stage renal disease: Secondary | ICD-10-CM | POA: Diagnosis not present

## 2021-10-25 DIAGNOSIS — Z992 Dependence on renal dialysis: Secondary | ICD-10-CM | POA: Diagnosis not present

## 2021-10-27 DIAGNOSIS — Z992 Dependence on renal dialysis: Secondary | ICD-10-CM | POA: Diagnosis not present

## 2021-10-27 DIAGNOSIS — N186 End stage renal disease: Secondary | ICD-10-CM | POA: Diagnosis not present

## 2021-10-29 DIAGNOSIS — N186 End stage renal disease: Secondary | ICD-10-CM | POA: Diagnosis not present

## 2021-10-29 DIAGNOSIS — Z992 Dependence on renal dialysis: Secondary | ICD-10-CM | POA: Diagnosis not present

## 2021-10-31 ENCOUNTER — Other Ambulatory Visit: Payer: Self-pay | Admitting: Family Medicine

## 2021-10-31 DIAGNOSIS — F329 Major depressive disorder, single episode, unspecified: Secondary | ICD-10-CM

## 2021-11-01 DIAGNOSIS — Z992 Dependence on renal dialysis: Secondary | ICD-10-CM | POA: Diagnosis not present

## 2021-11-01 DIAGNOSIS — N186 End stage renal disease: Secondary | ICD-10-CM | POA: Diagnosis not present

## 2021-11-03 DIAGNOSIS — Z992 Dependence on renal dialysis: Secondary | ICD-10-CM | POA: Diagnosis not present

## 2021-11-03 DIAGNOSIS — N186 End stage renal disease: Secondary | ICD-10-CM | POA: Diagnosis not present

## 2021-11-05 DIAGNOSIS — Z992 Dependence on renal dialysis: Secondary | ICD-10-CM | POA: Diagnosis not present

## 2021-11-05 DIAGNOSIS — E1122 Type 2 diabetes mellitus with diabetic chronic kidney disease: Secondary | ICD-10-CM | POA: Diagnosis not present

## 2021-11-05 DIAGNOSIS — N186 End stage renal disease: Secondary | ICD-10-CM | POA: Diagnosis not present

## 2021-11-08 DIAGNOSIS — N186 End stage renal disease: Secondary | ICD-10-CM | POA: Diagnosis not present

## 2021-11-08 DIAGNOSIS — Z992 Dependence on renal dialysis: Secondary | ICD-10-CM | POA: Diagnosis not present

## 2021-11-10 DIAGNOSIS — Z992 Dependence on renal dialysis: Secondary | ICD-10-CM | POA: Diagnosis not present

## 2021-11-10 DIAGNOSIS — N186 End stage renal disease: Secondary | ICD-10-CM | POA: Diagnosis not present

## 2021-11-12 DIAGNOSIS — Z992 Dependence on renal dialysis: Secondary | ICD-10-CM | POA: Diagnosis not present

## 2021-11-12 DIAGNOSIS — N186 End stage renal disease: Secondary | ICD-10-CM | POA: Diagnosis not present

## 2021-11-15 DIAGNOSIS — N186 End stage renal disease: Secondary | ICD-10-CM | POA: Diagnosis not present

## 2021-11-15 DIAGNOSIS — Z992 Dependence on renal dialysis: Secondary | ICD-10-CM | POA: Diagnosis not present

## 2021-11-17 DIAGNOSIS — N186 End stage renal disease: Secondary | ICD-10-CM | POA: Diagnosis not present

## 2021-11-17 DIAGNOSIS — Z992 Dependence on renal dialysis: Secondary | ICD-10-CM | POA: Diagnosis not present

## 2021-11-18 ENCOUNTER — Ambulatory Visit: Payer: Medicare Other | Admitting: Cardiovascular Disease

## 2021-11-18 NOTE — Progress Notes (Deleted)
Cardiology Office Note   Date:  11/18/2021   ID:  Lori Stanley, Lori Stanley 02-11-1932, MRN [id number]  PCP:  Jerrol Banana., MD  Cardiologist:   Kathlyn Sacramento, MD   No chief complaint on file.     History of Present Illness: Lori Stanley is a 88 y.o. female who presents for a followup visit regarding chronic diastolic heart failure . She has known history of hypertension, diabetes and end-stage renal disease on hemodialysis twice a week.  Nuclear stress test in January, 2015  showed no evidence of ischemia with normal ejection fraction.  No recent hospitalizations since she was started on dialysis.  Most recent echocardiogram in February of this year showed normal LV systolic function with mild aortic stenosis and mild pulmonary hypertension. She is doing well with no recent chest pain or worsening dyspnea.  Blood pressure has been controlled.   Past Medical History:  Diagnosis Date   Acute heart failure (HCC)    Anemia    Anxiety    CHF (congestive heart failure) (HCC)    Chronic kidney disease    stage V; end stage renal disease   Chronic low back pain    Depression    Diabetes mellitus without complication (HCC)    GERD (gastroesophageal reflux disease)    Hyperlipidemia    Hypertension    MI (myocardial infarction) ([place])    Obesity     Past Surgical History:  Procedure Laterality Date   A/V FISTULAGRAM Left 01/15/2020   Procedure: A/V FISTULAGRAM;  Surgeon: Katha Cabal, MD;  Location: [facility];  Service: Cardiovascular;  Laterality: Left;   ABDOMINAL HYSTERECTOMY     APPENDECTOMY     AV FISTULA PLACEMENT Left 11/13/2019   Procedure: INSERTION OF ARTERIOVENOUS (AV) GORE-TEX GRAFT ARM ( BRACHIAL AXILLARY );  Surgeon: Katha Cabal, MD;  Location: [facility];  Service: Vascular;  Laterality: Left;   DIALYSIS/PERMA CATHETER INSERTION N/A 06/07/2019   Procedure: DIALYSIS/PERMA CATHETER INSERTION;  Surgeon: Katha Cabal, MD;   Location: [facility];  Service: Cardiovascular;  Laterality: N/A;   DIALYSIS/PERMA CATHETER REMOVAL N/A 02/11/2020   Procedure: DIALYSIS/PERMA CATHETER REMOVAL;  Surgeon: Katha Cabal, MD;  Location: [facility];  Service: Cardiovascular;  Laterality: N/A;   EYE SURGERY     HERNIA REPAIR     INCISION AND DRAINAGE PERIRECTAL ABSCESS     INGUINAL HERNIA REPAIR N/A 07/12/2020   Procedure: HERNIA REPAIR INGUINAL ADULT;  Surgeon: Olean Ree, MD;  Location: [facility];  Service: General;  Laterality: N/A;   KNEE SURGERY     THYROID SURGERY     VAGINAL HYSTERECTOMY       Current Outpatient Medications  Medication Sig Dispense Refill   acetaminophen (TYLENOL) 500 MG tablet Take 500-1,000 mg by mouth every 6 (six) hours as needed for mild pain or fever.      albuterol (VENTOLIN HFA) 108 (90 Base) MCG/ACT inhaler TAKE 2 PUFFS BY MOUTH EVERY 6 HOURS AS NEEDED FOR WHEEZE OR SHORTNESS OF BREATH (Patient taking differently: Inhale 2 puffs into the lungs every 6 (six) hours as needed for wheezing or shortness of breath.) 8 g 1   Bepotastine Besilate 1.5 % SOLN Place 1 drop into both eyes in the morning and at bedtime. Morning & afternoon.     brimonidine (ALPHAGAN) 0.2 % ophthalmic solution Place 1 drop into both eyes 2 (two) times daily.      calcitRIOL (ROCALTROL) 0.25  MCG capsule Take 0.25 mcg by mouth every Monday, Wednesday, and Friday.      carvedilol (COREG) 12.5 MG tablet TAKE 1 TABLET BY MOUTH TWICE A DAY 180 tablet 1   dorzolamide-timolol (COSOPT) 22.3-6.8 MG/ML ophthalmic solution Place 1 drop into both eyes 2 (two) times daily.      fluticasone (FLONASE) 50 MCG/ACT nasal spray Place 1 spray into both nostrils daily. 16 g 2   latanoprost (XALATAN) 0.005 % ophthalmic solution Place 1 drop into both eyes at bedtime.     loratadine (CLARITIN) 10 MG tablet TAKE 1 TABLET BY MOUTH EVERY DAY 90 tablet 3   multivitamin (RENA-VIT) TABS tablet Take 1 tablet by mouth daily.       OXYGEN Inhale 2 L into the lungs at bedtime.     sertraline (ZOLOFT) 25 MG tablet TAKE 1 TABLET BY MOUTH EVERY DAY 90 tablet 1   torsemide (DEMADEX) 100 MG tablet Take 1 tablet (100 mg total) by mouth daily. 90 tablet 0   traZODone (DESYREL) 150 MG tablet TAKE 1 TABLET BY MOUTH AT BEDTIME. 90 tablet 3   No current facility-administered medications for this visit.    Allergies:   Antihistamines, chlorpheniramine-type; Paxil [paroxetine]; and Codeine    Social History:  The patient  reports that she has quit smoking. Her smoking use included cigarettes. She has never used smokeless tobacco. She reports that she does not drink alcohol and does not use drugs.   Family History:  The patient's family history includes Cancer in her sister; Heart attack in her mother; Heart disease in her sister.    ROS:  Please see the history of present illness.   Otherwise, review of systems are positive for none.   All other systems are reviewed and negative.    PHYSICAL EXAM: VS:  There were no vitals taken for this visit. , BMI There is no height or weight on file to calculate BMI. GEN: Well nourished, well developed, in no acute distress  HEENT: normal  Neck: no JVD, carotid bruits, or masses Cardiac: RRR; no rubs, or gallops,no edema . 3/ 6 systolic ejection murmur in the aortic area which is mid peaking. Respiratory:  clear to auscultation bilaterally, normal work of breathing GI: soft, nontender, nondistended, + BS MS: no deformity or atrophy  Skin: warm and dry, no rash Neuro:  Strength and sensation are intact Psych: euthymic mood, full affect She has a functioning fistula in the left arm.   EKG:  EKG is ordered today. The ekg ordered today demonstrates  normal sinus rhythm with no significant ST or T wave changes.    Recent Labs: 09/21/2021: ALT 12; BUN 31; Creatinine, Ser 3.17; Hemoglobin 8.4; Platelets 314; Potassium 3.9; Sodium 139; TSH 2.840    Lipid Panel    Component Value  Date/Time   CHOL 197 09/21/2021 1114   CHOL 137 05/13/2013 0344   TRIG 63 09/21/2021 1114   TRIG 104 05/13/2013 0344   HDL 62 09/21/2021 1114   HDL 43 05/13/2013 0344   CHOLHDL 3.2 09/21/2021 1114   VLDL 21 05/13/2013 0344   LDLCALC 123 (H) 09/21/2021 1114   LDLCALC 73 05/13/2013 0344      Wt Readings from Last 3 Encounters:  09/21/21 111 lb 8 oz (50.6 kg)  03/16/21 101 lb 11.2 oz (46.1 kg)  12/10/20 111 lb 6.4 oz (50.5 kg)          No data to display  ASSESSMENT AND PLAN:  1.  Chronic diastolic heart failure: She is doing very well overall with no evidence of volume overload.  Fluid management is done via dialysis but at the same time she is on torsemide as she continues to make some urine..  She takes torsemide 100 mg daily.   2. Essential hypertension: Blood pressure is well controlled on current medications.  3.  Aortic stenosis: I reviewed most recent echocardiogram in February and there was evidence of mild aortic stenosis with valve area of 1.9.  She has a cardiac murmur consistent with at least mild to moderate aortic stenosis.  Even if this gets worse in the future, I doubt that she will be a good candidate for TAVR given her age and the fact that she is on dialysis.   Disposition:   FU with me in 6 months.  Signed,  Kathlyn Sacramento, MD  11/18/2021 9:12 AM    [place]

## 2021-11-19 DIAGNOSIS — Z992 Dependence on renal dialysis: Secondary | ICD-10-CM | POA: Diagnosis not present

## 2021-11-19 DIAGNOSIS — N186 End stage renal disease: Secondary | ICD-10-CM | POA: Diagnosis not present

## 2021-11-22 DIAGNOSIS — N186 End stage renal disease: Secondary | ICD-10-CM | POA: Diagnosis not present

## 2021-11-22 DIAGNOSIS — Z992 Dependence on renal dialysis: Secondary | ICD-10-CM | POA: Diagnosis not present

## 2021-11-26 ENCOUNTER — Other Ambulatory Visit: Payer: Self-pay

## 2021-11-26 ENCOUNTER — Other Ambulatory Visit
Admission: RE | Admit: 2021-11-26 | Discharge: 2021-11-26 | Disposition: A | Discharge status: Home / Self Care | Payer: Medicare Other | Source: Ambulatory Visit | Attending: Family Medicine | Admitting: Family Medicine

## 2021-11-26 ENCOUNTER — Inpatient Hospital Stay
Admission: EM | Admit: 2021-11-26 | Discharge: 2021-11-30 | DRG: 377 | Disposition: A | Discharge status: Home / Self Care | Payer: Medicare Other | Attending: Internal Medicine | Admitting: Internal Medicine

## 2021-11-26 DIAGNOSIS — Z992 Dependence on renal dialysis: Secondary | ICD-10-CM

## 2021-11-26 DIAGNOSIS — N186 End stage renal disease: Secondary | ICD-10-CM

## 2021-11-26 DIAGNOSIS — F419 Anxiety disorder, unspecified: Secondary | ICD-10-CM | POA: Diagnosis not present

## 2021-11-26 DIAGNOSIS — I5032 Chronic diastolic (congestive) heart failure: Secondary | ICD-10-CM

## 2021-11-26 DIAGNOSIS — K449 Diaphragmatic hernia without obstruction or gangrene: Secondary | ICD-10-CM | POA: Diagnosis present

## 2021-11-26 DIAGNOSIS — D638 Anemia in other chronic diseases classified elsewhere: Secondary | ICD-10-CM | POA: Diagnosis not present

## 2021-11-26 DIAGNOSIS — E876 Hypokalemia: Secondary | ICD-10-CM | POA: Diagnosis not present

## 2021-11-26 DIAGNOSIS — F32A Depression, unspecified: Secondary | ICD-10-CM | POA: Diagnosis present

## 2021-11-26 DIAGNOSIS — Z888 Allergy status to other drugs, medicaments and biological substances status: Secondary | ICD-10-CM

## 2021-11-26 DIAGNOSIS — K2971 Gastritis, unspecified, with bleeding: Secondary | ICD-10-CM | POA: Diagnosis not present

## 2021-11-26 DIAGNOSIS — K3189 Other diseases of stomach and duodenum: Secondary | ICD-10-CM | POA: Diagnosis not present

## 2021-11-26 DIAGNOSIS — D62 Acute posthemorrhagic anemia: Secondary | ICD-10-CM | POA: Diagnosis not present

## 2021-11-26 DIAGNOSIS — I252 Old myocardial infarction: Secondary | ICD-10-CM

## 2021-11-26 DIAGNOSIS — H409 Unspecified glaucoma: Secondary | ICD-10-CM

## 2021-11-26 DIAGNOSIS — K222 Esophageal obstruction: Secondary | ICD-10-CM | POA: Diagnosis not present

## 2021-11-26 DIAGNOSIS — I10 Essential (primary) hypertension: Secondary | ICD-10-CM

## 2021-11-26 DIAGNOSIS — I132 Hypertensive heart and chronic kidney disease with heart failure and with stage 5 chronic kidney disease, or end stage renal disease: Secondary | ICD-10-CM | POA: Diagnosis present

## 2021-11-26 DIAGNOSIS — E119 Type 2 diabetes mellitus without complications: Secondary | ICD-10-CM

## 2021-11-26 DIAGNOSIS — K219 Gastro-esophageal reflux disease without esophagitis: Secondary | ICD-10-CM

## 2021-11-26 DIAGNOSIS — K31819 Angiodysplasia of stomach and duodenum without bleeding: Secondary | ICD-10-CM | POA: Diagnosis not present

## 2021-11-26 DIAGNOSIS — D631 Anemia in chronic kidney disease: Secondary | ICD-10-CM | POA: Diagnosis present

## 2021-11-26 DIAGNOSIS — N2581 Secondary hyperparathyroidism of renal origin: Secondary | ICD-10-CM | POA: Diagnosis not present

## 2021-11-26 DIAGNOSIS — E1122 Type 2 diabetes mellitus with diabetic chronic kidney disease: Secondary | ICD-10-CM

## 2021-11-26 DIAGNOSIS — D649 Anemia, unspecified: Principal | ICD-10-CM

## 2021-11-26 DIAGNOSIS — K922 Gastrointestinal hemorrhage, unspecified: Secondary | ICD-10-CM | POA: Diagnosis not present

## 2021-11-26 DIAGNOSIS — E785 Hyperlipidemia, unspecified: Secondary | ICD-10-CM | POA: Diagnosis not present

## 2021-11-26 DIAGNOSIS — K921 Melena: Secondary | ICD-10-CM | POA: Diagnosis not present

## 2021-11-26 DIAGNOSIS — G8929 Other chronic pain: Secondary | ICD-10-CM | POA: Diagnosis not present

## 2021-11-26 DIAGNOSIS — Z885 Allergy status to narcotic agent status: Secondary | ICD-10-CM

## 2021-11-26 DIAGNOSIS — F329 Major depressive disorder, single episode, unspecified: Secondary | ICD-10-CM

## 2021-11-26 DIAGNOSIS — Z87891 Personal history of nicotine dependence: Secondary | ICD-10-CM

## 2021-11-26 DIAGNOSIS — Z79899 Other long term (current) drug therapy: Secondary | ICD-10-CM | POA: Diagnosis not present

## 2021-11-26 DIAGNOSIS — R195 Other fecal abnormalities: Secondary | ICD-10-CM | POA: Diagnosis not present

## 2021-11-26 HISTORY — DX: Unilateral inguinal hernia, with obstruction, without gangrene, not specified as recurrent: K40.30

## 2021-11-26 LAB — COMPREHENSIVE METABOLIC PANEL
ALT: 13 U/L (ref 0–44)
AST: 21 U/L (ref 15–41)
Albumin: 3.2 g/dL — ABNORMAL LOW (ref 3.5–5.0)
Alkaline Phosphatase: 94 U/L (ref 38–126)
Anion gap: 10 (ref 5–15)
BUN: 23 mg/dL (ref 8–23)
CO2: 29 mmol/L (ref 22–32)
Calcium: 8.9 mg/dL (ref 8.9–10.3)
Chloride: 104 mmol/L (ref 98–111)
Creatinine, Ser: 1.93 mg/dL — ABNORMAL HIGH (ref 0.44–1.00)
GFR, Estimated: 25 mL/min — ABNORMAL LOW (ref >60)
Glucose, Bld: 141 mg/dL — ABNORMAL HIGH (ref 70–99)
Potassium: 3.3 mmol/L — ABNORMAL LOW (ref 3.5–5.1)
Sodium: 143 mmol/L (ref 135–145)
Total Bilirubin: 0.5 mg/dL (ref 0.3–1.2)
Total Protein: 6 g/dL — ABNORMAL LOW (ref 6.5–8.1)

## 2021-11-26 LAB — CBC WITH DIFFERENTIAL/PLATELET
Abs Immature Granulocytes: 0.02 10*3/uL (ref 0.00–0.07)
Basophils Absolute: 0 10*3/uL (ref 0.0–0.1)
Basophils Relative: 1 %
Eosinophils Absolute: 0.1 10*3/uL (ref 0.0–0.5)
Eosinophils Relative: 2 %
HCT: 21.8 % — ABNORMAL LOW (ref 36.0–46.0)
Hemoglobin: 6.5 g/dL — ABNORMAL LOW (ref 12.0–15.0)
Immature Granulocytes: 0 %
Lymphocytes Relative: 21 %
Lymphs Abs: 1.2 10*3/uL (ref 0.7–4.0)
MCH: 26 pg (ref 26.0–34.0)
MCHC: 29.8 g/dL — ABNORMAL LOW (ref 30.0–36.0)
MCV: 87.2 fL (ref 80.0–100.0)
Monocytes Absolute: 0.4 10*3/uL (ref 0.1–1.0)
Monocytes Relative: 7 %
Neutro Abs: 3.8 10*3/uL (ref 1.7–7.7)
Neutrophils Relative %: 69 %
Platelets: 266 10*3/uL (ref 150–400)
RBC: 2.5 MIL/uL — ABNORMAL LOW (ref 3.87–5.11)
RDW: 16.8 % — ABNORMAL HIGH (ref 11.5–15.5)
WBC: 5.5 10*3/uL (ref 4.0–10.5)
nRBC: 0 % (ref 0.0–0.2)

## 2021-11-26 LAB — PREPARE RBC (CROSSMATCH)

## 2021-11-26 LAB — BRAIN NATRIURETIC PEPTIDE: B Natriuretic Peptide: 525 pg/mL — ABNORMAL HIGH (ref 0.0–100.0)

## 2021-11-26 LAB — HEMATOCRIT: HCT: 21 % — ABNORMAL LOW (ref 36.0–46.0)

## 2021-11-26 LAB — HEMOGLOBIN: Hemoglobin: 6.3 g/dL — ABNORMAL LOW (ref 12.0–15.0)

## 2021-11-26 MED ORDER — POLYETHYLENE GLYCOL 3350 17 G PO PACK: 17.0000 g | PACK | Freq: Every day | ORAL | Status: DC | PRN

## 2021-11-26 MED ORDER — ACETAMINOPHEN 650 MG RE SUPP: 650.0000 mg | Freq: Four times a day (QID) | RECTAL | Status: DC | PRN

## 2021-11-26 MED ORDER — PANTOPRAZOLE INFUSION (NEW) - SIMPLE MED
8.0000 mg/h | INTRAVENOUS | Status: AC
  Administered 2021-11-26 – 2021-11-28 (×6): 8 mg/h via INTRAVENOUS
  Filled 2021-11-26 (×6): qty 100

## 2021-11-26 MED ORDER — PANTOPRAZOLE SODIUM 40 MG IV SOLR: 40.0000 mg | Freq: Two times a day (BID) | INTRAVENOUS | Status: DC

## 2021-11-26 MED ORDER — RENA-VITE PO TABS
1.0000 | ORAL_TABLET | Freq: Every day | ORAL | Status: DC
  Administered 2021-11-27 – 2021-11-30 (×4): 1 via ORAL
  Filled 2021-11-26 (×4): qty 1

## 2021-11-26 MED ORDER — LATANOPROST 0.005 % OP SOLN
1.0000 [drp] | Freq: Every day | OPHTHALMIC | Status: DC
  Administered 2021-11-26 – 2021-11-29 (×4): 1 [drp] via OPHTHALMIC
  Filled 2021-11-26: qty 2.5

## 2021-11-26 MED ORDER — ACETAMINOPHEN 325 MG PO TABS: 650.0000 mg | ORAL_TABLET | Freq: Four times a day (QID) | ORAL | Status: DC | PRN

## 2021-11-26 MED ORDER — CARVEDILOL 6.25 MG PO TABS
12.5000 mg | ORAL_TABLET | Freq: Two times a day (BID) | ORAL | Status: DC
  Administered 2021-11-26 – 2021-11-30 (×8): 12.5 mg via ORAL
  Filled 2021-11-26 (×8): qty 2

## 2021-11-26 MED ORDER — TORSEMIDE 20 MG PO TABS
100.0000 mg | ORAL_TABLET | Freq: Every day | ORAL | Status: DC
  Administered 2021-11-27 – 2021-11-30 (×4): 100 mg via ORAL
  Filled 2021-11-26 (×4): qty 5

## 2021-11-26 MED ORDER — BRIMONIDINE TARTRATE 0.2 % OP SOLN
1.0000 [drp] | Freq: Two times a day (BID) | OPHTHALMIC | Status: DC
  Administered 2021-11-26 – 2021-11-30 (×8): 1 [drp] via OPHTHALMIC
  Filled 2021-11-26: qty 5

## 2021-11-26 MED ORDER — OLOPATADINE HCL 0.1 % OP SOLN
1.0000 [drp] | Freq: Two times a day (BID) | OPHTHALMIC | Status: DC
  Administered 2021-11-26 – 2021-11-30 (×8): 1 [drp] via OPHTHALMIC
  Filled 2021-11-26: qty 5

## 2021-11-26 MED ORDER — ALBUTEROL SULFATE (2.5 MG/3ML) 0.083% IN NEBU
2.5000 mg | INHALATION_SOLUTION | Freq: Four times a day (QID) | RESPIRATORY_TRACT | Status: DC | PRN
Start: 2021-11-26 — End: 2021-11-30

## 2021-11-26 MED ORDER — SODIUM CHLORIDE 0.9 % IV SOLN: 10.0000 mL/h | Freq: Once | INTRAVENOUS | Status: DC

## 2021-11-26 MED ORDER — SERTRALINE HCL 25 MG PO TABS
25.0000 mg | ORAL_TABLET | Freq: Every day | ORAL | Status: DC
  Administered 2021-11-27 – 2021-11-30 (×4): 25 mg via ORAL
  Filled 2021-11-26 (×4): qty 1

## 2021-11-26 MED ORDER — DORZOLAMIDE HCL-TIMOLOL MAL 2-0.5 % OP SOLN
1.0000 [drp] | Freq: Two times a day (BID) | OPHTHALMIC | Status: DC
  Administered 2021-11-26 – 2021-11-30 (×8): 1 [drp] via OPHTHALMIC
  Filled 2021-11-26: qty 10

## 2021-11-26 MED ORDER — TRAZODONE HCL 50 MG PO TABS
150.0000 mg | ORAL_TABLET | Freq: Every day | ORAL | Status: DC
Start: 2021-11-26 — End: 2021-11-30
  Administered 2021-11-26 – 2021-11-29 (×4): 150 mg via ORAL
  Filled 2021-11-26 (×4): qty 3

## 2021-11-26 MED ORDER — PANTOPRAZOLE 80MG IVPB - SIMPLE MED
80.0000 mg | Freq: Once | INTRAVENOUS | Status: AC
  Administered 2021-11-26: 80 mg via INTRAVENOUS
  Filled 2021-11-26: qty 100

## 2021-11-26 MED ORDER — CALCITRIOL 0.25 MCG PO CAPS
0.2500 ug | ORAL_CAPSULE | ORAL | Status: DC
  Administered 2021-11-29: 0.25 ug via ORAL
  Filled 2021-11-26: qty 1

## 2021-11-26 MED ORDER — SODIUM CHLORIDE 0.9% FLUSH
3.0000 mL | Freq: Two times a day (BID) | INTRAVENOUS | Status: DC
Start: 2021-11-26 — End: 2021-11-30
  Administered 2021-11-26 – 2021-11-29 (×6): 3 mL via INTRAVENOUS

## 2021-11-26 NOTE — ED Notes (Signed)
First nurse notified of BP of 119/43. Pt informed not to stand up from wheelchair. Pt's skin is dry and warm to touch, breathing is even and unlabored at this time, capillary refill is <3 seconds.

## 2021-11-26 NOTE — ED Notes (Signed)
First RN Note: Pt to ED via POV, pt sent for abn labs at this time. Pt with Hgb 6.4 per labs done at dialysis earlier today. Pt presents A&O, NAD noted on arrival to triage.

## 2021-11-26 NOTE — ED Triage Notes (Addendum)
Pt sent by dialysis today for "low blood"- pt unsure, thinks potassium, pt denies any s/s- CP, SHOB, dizziness, etc. Pt is AOX4, NAD noted. Pt gets dialysis Mon Wed Fri

## 2021-11-26 NOTE — ED Provider Notes (Signed)
[facility] Provider Note    Event Date/Time   First MD Initiated Contact with Patient 11/26/21 1921     (approximate)   History   abnormal labs   HPI  Lori Stanley is a 88 y.o. female with past medical history of hypertension type 2 diabetes hyperlipidemia end-stage renal disease on dialysis Monday Wednesday Friday, anemia, diastolic heart failure who presents with low blood count.  Patient tells me she is feeling fine.  Today she did see some blood in her stool which she describes as bright red but this was the first time denies abdominal pain no chest pain shortness of breath no history of GI bleeding denies being on a blood thinner.     Past Medical History:  Diagnosis Date   Acute heart failure (HCC)    Anemia    Anxiety    CHF (congestive heart failure) (HCC)    Chronic kidney disease    stage V; end stage renal disease   Chronic low back pain    Depression    Diabetes mellitus without complication (HCC)    GERD (gastroesophageal reflux disease)    Hyperlipidemia    Hypertension    MI (myocardial infarction) ([place])    Obesity     Patient Active Problem List   Diagnosis Date Noted   Hematoma 07/17/2020   Chronic diastolic CHF (congestive heart failure) ([place]) 07/11/2020   Incarcerated left inguinal hernia 07/11/2020   SBO (small bowel obstruction) (HCC)    Unilateral inguinal hernia with obstruction and without gangrene    Volume overload 08/30/2019   Anemia due to stage 4 chronic kidney disease ([place]) 08/27/2019   ESRD (end stage renal disease) ([place])    Acute on chronic heart failure with preserved ejection fraction (HFpEF) ([place]) 06/05/2019   Heart failure ([place]) 06/05/2019   Edema of both lower extremities    Anemia in chronic kidney disease 01/23/2019   Benign hypertensive kidney disease with chronic kidney disease 01/23/2019   Chronic kidney disease (CKD), stage V ([place]) 01/23/2019   Proteinuria 01/23/2019   Secondary  hyperparathyroidism of renal origin ([place]) 01/23/2019   Chronic kidney disease, stage 5 ([place]) 01/23/2019   Type 2 diabetes mellitus with diabetic chronic kidney disease ([place]) 01/23/2019   Respiratory failure ([place]) 04/09/2017   Syncopal episodes 12/15/2015   Anemia of chronic disease 04/14/2015   Anxiety 04/14/2015   Clinical depression 04/14/2015   Essential hypertension 04/14/2015   Gastro-esophageal reflux disease without esophagitis 04/14/2015   Arthritis, degenerative 04/14/2015   Allergic rhinitis 04/14/2015   Type 2 diabetes mellitus with hyperlipidemia ([place]) 04/14/2015   Hyperlipidemia 02/27/2015   Glaucoma 12/23/2014   Diabetes ([place]) 12/23/2014   GERD (gastroesophageal reflux disease) 12/23/2014   Essential hypertension, malignant 05/24/2013   SOB (shortness of breath) 05/24/2013   Acute on chronic diastolic CHF (congestive heart failure) ([place]) 05/24/2013     Physical Exam  Triage Vital Signs: ED Triage Vitals  Enc Vitals Group     BP 11/26/21 1838 (!) 119/43     Pulse Rate 11/26/21 1838 84     Resp 11/26/21 1838 18     Temp 11/26/21 1838 98.9 F (37.2 C)     Temp Source 11/26/21 1838 Oral     SpO2 11/26/21 1838 94 %     Weight --      Height --      Head Circumference --      Peak Flow --      Pain Score  11/26/21 1839 0     Pain Loc --      Pain Edu? --      Excl. in [place]? --     Most recent vital signs: Vitals:   11/26/21 1838 11/26/21 2000  BP: (!) 119/43 (!) 128/49  Pulse: 84 73  Resp: 18 (!) 25  Temp: 98.9 F (37.2 C)   SpO2: 94% 97%     General: Awake, no distress.  CV:  Good peripheral perfusion.  1+ edema, right greater than left Resp:  Normal effort.  No increased work of breathing Abd:  No distention.  Abdomen soft nontender Neuro:             Awake, Alert, Oriented x 3  Other:  Maroon stool on rectal exam   ED Results / Procedures / Treatments  Labs (all labs ordered are listed, but only abnormal results are displayed) Labs Reviewed   CBC WITH DIFFERENTIAL/PLATELET - Abnormal; Notable for the following components:      Result Value   RBC 2.50 (*)    Hemoglobin 6.5 (*)    HCT 21.8 (*)    MCHC 29.8 (*)    RDW 16.8 (*)    All other components within normal limits  COMPREHENSIVE METABOLIC PANEL - Abnormal; Notable for the following components:   Potassium 3.3 (*)    Glucose, Bld 141 (*)    Creatinine, Ser 1.93 (*)    Total Protein 6.0 (*)    Albumin 3.2 (*)    GFR, Estimated 25 (*)    All other components within normal limits  BRAIN NATRIURETIC PEPTIDE - Abnormal; Notable for the following components:   B Natriuretic Peptide 525.0 (*)    All other components within normal limits  TYPE AND SCREEN  PREPARE RBC (CROSSMATCH)     EKG     RADIOLOGY    PROCEDURES:  Critical Care performed: Yes, see critical care procedure note(s)  .Critical Care  Performed by: Rada Hay, MD Authorized by: Rada Hay, MD   Critical care provider statement:    Critical care time (minutes):  30   Critical care was time spent personally by me on the following activities:  Development of treatment plan with patient or surrogate, discussions with consultants, evaluation of patient's response to treatment, examination of patient, ordering and review of laboratory studies, ordering and review of radiographic studies, ordering and performing treatments and interventions, pulse oximetry, re-evaluation of patient's condition and review of old charts   The patient is on the cardiac monitor to evaluate for evidence of arrhythmia and/or significant heart rate changes.   MEDICATIONS ORDERED IN ED: Medications  pantoprazole (PROTONIX) 80 mg /NS 100 mL IVPB (has no administration in time range)  pantoprozole (PROTONIX) 80 mg /NS 100 mL infusion (has no administration in time range)  pantoprazole (PROTONIX) injection 40 mg (has no administration in time range)  0.9 %  sodium chloride infusion (has no administration in  time range)     IMPRESSION / MDM / ASSESSMENT AND PLAN / ED COURSE  I reviewed the triage vital signs and the nursing notes.                              Patient's presentation is most consistent with acute presentation with potential threat to life or bodily function.  Differential diagnosis includes, but is not limited to, anemia of chronic disease, upper GI bleed, iron deficiency anemia, hemolysis  The patient is an 88 year old female presenting with anemia on outpatient labs.  Hemoglobin was around 6.5.  She is not have any acute complaints other than seeing blood in her stool today.  Described as bright red mixed in with the stool.  She has no shortness of breath chest pain or other ischemic symptoms.  She is not lightheaded or dizzy she is hemodynamically stable.  Had dialysis today.  On exam she does have some pitting edema in the bilateral lower extremities right greater than left which is chronic for her ever since the knee surgery apparently.  On rectal exam she has dark brown/maroon appearing stool is not frankly melanotic it is not bright red it is guaiac positive.  Hemoglobin was last checked 2 months ago and it was 8.5.  No history of GI bleeding.  Given this finding and her anemia we will transfuse a unit of blood slowly and start on Protonix.  She will require admission.       FINAL CLINICAL IMPRESSION(S) / ED DIAGNOSES   Final diagnoses:  Anemia, unspecified type     Rx / DC Orders   ED Discharge Orders     None        Note:  This document was prepared using Dragon voice recognition software and may include unintentional dictation errors.   Rada Hay, MD 11/26/21 2032

## 2021-11-26 NOTE — H&P (Signed)
History and Physical   Lori Stanley IRC:[id number] DOB: [birth year] DOA: 11/26/2021  PCP: Jerrol Banana., MD   Patient coming from: Home  Chief Complaint: Abnormal labs  HPI: Lori Stanley is a 88 y.o. female with medical history significant of hypertension, diastolic heart failure, diabetes, GERD, glaucoma, hyperlipidemia, anemia, anxiety, depression, ESRD on HD MWF presenting with abnormal lab value outpatient, low hemoglobin.  Patient had outpatient labs checked today noted to have hemoglobin around 6.5.  Present to the ED for further evaluation and likely blood transfusion.  She denies any lightheadedness or weakness.  She does report episode of bright red blood in her stool today.  No history of GI bleed.  Does have history of anemia of chronic disease.  She denies fevers, chills, chest pain, shortness of breath, abdominal pain, constipation, diarrhea, nausea, vomiting.  ED Course: Vital signs in the ED significant for blood pressure in the 510C to 585I systolic.  Lab work-up included CMP with potassium 3.3, creatinine stable 1.93, glucose 141, protein 6, albumin 3.2, hemoglobin 6.5 down from 8.52 months ago.  BNP elevated at 525.  FOBT positive.  Patient was typed and screened in the ED.  1 unit transfusion has been ordered.  Patient started on IV PPI.  Review of Systems: As per HPI otherwise all other systems reviewed and are negative.  Past Medical History:  Diagnosis Date   Acute heart failure (HCC)    Acute on chronic heart failure with preserved ejection fraction (HFpEF) ([place]) 06/05/2019   Anemia    Anemia due to stage 4 chronic kidney disease ([place]) 08/27/2019   Anxiety    CHF (congestive heart failure) (HCC)    Chronic kidney disease    stage V; end stage renal disease   Chronic kidney disease, stage 5 ([place]) 01/23/2019   Chronic low back pain    Depression    Diabetes mellitus without complication ([place])    Essential hypertension, malignant 05/24/2013   GERD  (gastroesophageal reflux disease)    Hematoma 07/17/2020   Hyperlipidemia    Hypertension    MI (myocardial infarction) ([place])    Obesity    Proteinuria 01/23/2019   Respiratory failure ([place]) 04/09/2017   SOB (shortness of breath) 05/24/2013   Unilateral inguinal hernia with obstruction and without gangrene    Volume overload 08/30/2019    Past Surgical History:  Procedure Laterality Date   A/V FISTULAGRAM Left 01/15/2020   Procedure: A/V FISTULAGRAM;  Surgeon: Katha Cabal, MD;  Location: [place] [facility];  Service: Cardiovascular;  Laterality: Left;   ABDOMINAL HYSTERECTOMY     APPENDECTOMY     AV FISTULA PLACEMENT Left 11/13/2019   Procedure: INSERTION OF ARTERIOVENOUS (AV) GORE-TEX GRAFT ARM ( BRACHIAL AXILLARY );  Surgeon: Katha Cabal, MD;  Location: [facility];  Service: Vascular;  Laterality: Left;   DIALYSIS/PERMA CATHETER INSERTION N/A 06/07/2019   Procedure: DIALYSIS/PERMA CATHETER INSERTION;  Surgeon: Katha Cabal, MD;  Location: [place] [facility];  Service: Cardiovascular;  Laterality: N/A;   DIALYSIS/PERMA CATHETER REMOVAL N/A 02/11/2020   Procedure: DIALYSIS/PERMA CATHETER REMOVAL;  Surgeon: Katha Cabal, MD;  Location: [place] [facility];  Service: Cardiovascular;  Laterality: N/A;   EYE SURGERY     HERNIA REPAIR     INCISION AND DRAINAGE PERIRECTAL ABSCESS     INGUINAL HERNIA REPAIR N/A 07/12/2020   Procedure: HERNIA REPAIR INGUINAL ADULT;  Surgeon: Olean Ree, MD;  Location: [facility];  Service: General;  Laterality: N/A;  KNEE SURGERY     THYROID SURGERY     VAGINAL HYSTERECTOMY      Social History  reports that she has quit smoking. Her smoking use included cigarettes. She has never used smokeless tobacco. She reports that she does not drink alcohol and does not use drugs.  Allergies  Allergen Reactions   Antihistamines, Chlorpheniramine-Type Other (See Comments)    Unknown reaction.   Paxil [Paroxetine]     "makes her feel funny"  not in a good way   Codeine Rash and Other (See Comments)    Mouth sore    Family History  Problem Relation Age of Onset   Heart attack Mother    Heart disease Sister    Cancer Sister   Reviewed on admission  Prior to Admission medications   Medication Sig Start Date End Date Taking? Authorizing Provider  acetaminophen (TYLENOL) 500 MG tablet Take 500-1,000 mg by mouth every 6 (six) hours as needed for mild pain or fever.     [provider]  albuterol (VENTOLIN HFA) 108 (90 Base) MCG/ACT inhaler TAKE 2 PUFFS BY MOUTH EVERY 6 HOURS AS NEEDED FOR WHEEZE OR SHORTNESS OF BREATH Patient taking differently: Inhale 2 puffs into the lungs every 6 (six) hours as needed for wheezing or shortness of breath. 10/05/19   Jerrol Banana., MD  Bepotastine Besilate 1.5 % SOLN Place 1 drop into both eyes in the morning and at bedtime. Morning & afternoon.    [provider]  brimonidine (ALPHAGAN) 0.2 % ophthalmic solution Place 1 drop into both eyes 2 (two) times daily.     [provider]  calcitRIOL (ROCALTROL) 0.25 MCG capsule Take 0.25 mcg by mouth every Monday, Wednesday, and Friday.  10/23/18   [provider]  carvedilol (COREG) 12.5 MG tablet TAKE 1 TABLET BY MOUTH TWICE A DAY 04/30/21   Birdie Sons, MD  dorzolamide-timolol (COSOPT) 22.3-6.8 MG/ML ophthalmic solution Place 1 drop into both eyes 2 (two) times daily.  03/06/19   [provider]  fluticasone (FLONASE) 50 MCG/ACT nasal spray Place 1 spray into both nostrils daily. 08/05/21   Drubel, Ria Comment, PA-C  latanoprost (XALATAN) 0.005 % ophthalmic solution Place 1 drop into both eyes at bedtime. 02/11/15   [provider]  loratadine (CLARITIN) 10 MG tablet TAKE 1 TABLET BY MOUTH EVERY DAY 07/21/21   Jerrol Banana., MD  multivitamin (RENA-VIT) TABS tablet Take 1 tablet by mouth daily.  09/13/19   [provider]  OXYGEN Inhale 2 L into the lungs at bedtime.    [provider]  sertraline (ZOLOFT) 25 MG tablet TAKE 1 TABLET BY MOUTH EVERY DAY 11/01/21   Birdie Sons, MD  torsemide (DEMADEX) 100 MG tablet Take 1 tablet (100 mg total) by mouth daily. 08/06/21   Mikey Kirschner, PA-C  traZODone (DESYREL) 150 MG tablet TAKE 1 TABLET BY MOUTH AT BEDTIME. 04/16/21   Mecum, Dani Gobble, PA-C    Physical Exam: Vitals:   11/26/21 1838 11/26/21 2000 11/26/21 2030 11/26/21 2100  BP: (!) 119/43 (!) 128/49 (!) 134/52 (!) 147/65  Pulse: 84 73 71 73  Resp: 18 (!) 25 20 16   Temp: 98.9 F (37.2 C)     TempSrc: Oral     SpO2: 94% 97% 98% 99%    Physical Exam Constitutional:      General: She is not in acute distress.    Appearance: Normal appearance.  HENT:     Head: Normocephalic  and atraumatic.     Mouth/Throat:     Mouth: Mucous membranes are moist.     Pharynx: Oropharynx is clear.  Eyes:     Extraocular Movements: Extraocular movements intact.     Pupils: Pupils are equal, round, and reactive to light.  Cardiovascular:     Rate and Rhythm: Normal rate and regular rhythm.     Pulses: Normal pulses.     Heart sounds: Normal heart sounds.  Pulmonary:     Effort: Pulmonary effort is normal. No respiratory distress.     Breath sounds: Normal breath sounds.  Abdominal:     General: Bowel sounds are normal. There is no distension.     Palpations: Abdomen is soft.     Tenderness: There is no abdominal tenderness.  Musculoskeletal:        General: No swelling or deformity.  Skin:    General: Skin is warm and dry.  Neurological:     General: No focal deficit present.     Mental Status: Mental status is at baseline.     Labs on Admission: I have personally reviewed following labs and imaging studies  CBC: Recent Labs  Lab 11/26/21 1244 11/26/21 1848  WBC  --  5.5  NEUTROABS  --  3.8  HGB 6.3* 6.5*  HCT 21.0* 21.8*  MCV  --  87.2  PLT  --  443    Basic Metabolic Panel: Recent Labs  Lab 11/26/21 1848  NA 143  K 3.3*  CL 104  CO2 29   GLUCOSE 141*  BUN 23  CREATININE 1.93*  CALCIUM 8.9    GFR: CrCl cannot be calculated (Unknown ideal weight.).  Liver Function Tests: Recent Labs  Lab 11/26/21 1848  AST 21  ALT 13  ALKPHOS 94  BILITOT 0.5  PROT 6.0*  ALBUMIN 3.2*    Urine analysis:    Component Value Date/Time   COLORURINE YELLOW (A) 07/14/2020 0540   APPEARANCEUR HAZY (A) 07/14/2020 0540   APPEARANCEUR Clear 05/11/2013 2255   LABSPEC 1.015 07/14/2020 0540   LABSPEC 1.010 05/11/2013 2255   PHURINE 5.0 07/14/2020 0540   GLUCOSEU NEGATIVE 07/14/2020 0540   GLUCOSEU >=500 05/11/2013 2255   HGBUR SMALL (A) 07/14/2020 0540   BILIRUBINUR NEGATIVE 07/14/2020 0540   BILIRUBINUR Negative 05/11/2013 [place] 07/14/2020 0540   PROTEINUR 100 (A) 07/14/2020 0540   NITRITE NEGATIVE 07/14/2020 0540   LEUKOCYTESUR MODERATE (A) 07/14/2020 0540   LEUKOCYTESUR Negative 05/11/2013 2255    Radiological Exams on Admission: No results found.  EKG: Not yet performed  Assessment/Plan Principal Problem:   GI bleed Active Problems:   Glaucoma   Diabetes (HCC)   GERD (gastroesophageal reflux disease)   Hyperlipidemia   Anemia of chronic disease   Anxiety   Clinical depression   Essential hypertension   ESRD (end stage renal disease) (HCC)   Chronic diastolic CHF (congestive heart failure) (HCC)   Severe anemia GI bleed > Patient presenting with abnormal lab value with significant anemia with hemoglobin 6.5. > Has known history of anemia of chronic kidney disease with her ESRD status but has also noticed some GI bleeding today.  Was FOBT positive in the ED. > Patient started on IV PPI in the ED. - Message sent to GI, may need to confirm they are following. - Monitor on telemetry - Continue IV PPI  - Continue with 1 unit transfusion, if further needed will need to consult nephrology to avoid taco - Trend hemoglobin  ESRD on HD MWF > Did undergo HD today.  Mild hypokalemia 3.3 in the ED  but otherwise renal function electrolytes stable.  We will hold off on any repletion given ESRD status. - Continue home torsemide - Continue home calcitriol - We will need nephrology consult if here through Monday or needs significant transfusion  Diabetes - SSI  GERD - On IV PPI as above  Glaucoma - Continue home eyedrops  Anxiety Depression - Continue home sertraline and trazodone  CHF - Continue home carvedilol and torsemide - I's and O's, daily weights   DVT prophylaxis: SCDs Code Status:   Full Family Communication:  None admission, she states her family was here earlier and is up-to-date. Disposition Plan:   Patient is from:  Home  Anticipated DC to:  Home  Anticipated DC date:  1 to 3 days  Anticipated DC barriers: None  Consults called:  None.  Message sent to GI.  Will need nephrology consult if needs significant transfusion or is here through Monday. Admission status:  Observation, telemetry  Severity of Illness: The appropriate patient status for this patient is OBSERVATION. Observation status is judged to be reasonable and necessary in order to provide the required intensity of service to ensure the patient's safety. The patient's presenting symptoms, physical exam findings, and initial radiographic and laboratory data in the context of their medical condition is felt to place them at decreased risk for further clinical deterioration. Furthermore, it is anticipated that the patient will be medically stable for discharge from the hospital within 2 midnights of admission.    Marcelyn Bruins MD [facility]  How to contact the [facility] Attending or Consulting provider [place] or covering provider during after hours [place], for this patient?   Check the care team in [facility] and look for a) attending/consulting [facility] provider listed and b) the [facility] team listed Log into [email or web address] and use [place]'s universal password to access. If you do not have the password,  please contact the hospital operator. Locate the [facility] provider you are looking for under [facility] and page to a number that you can be directly reached. If you still have difficulty reaching the provider, please page the [facility] (Director on Call) for the Hospitalists listed on amion for assistance.  11/26/2021, 9:08 PM

## 2021-11-27 DIAGNOSIS — D62 Acute posthemorrhagic anemia: Secondary | ICD-10-CM | POA: Diagnosis present

## 2021-11-27 DIAGNOSIS — D649 Anemia, unspecified: Secondary | ICD-10-CM | POA: Diagnosis not present

## 2021-11-27 DIAGNOSIS — R195 Other fecal abnormalities: Secondary | ICD-10-CM

## 2021-11-27 LAB — COMPREHENSIVE METABOLIC PANEL
ALT: 12 U/L (ref 0–44)
AST: 15 U/L (ref 15–41)
Albumin: 2.8 g/dL — ABNORMAL LOW (ref 3.5–5.0)
Alkaline Phosphatase: 92 U/L (ref 38–126)
Anion gap: 6 (ref 5–15)
BUN: 24 mg/dL — ABNORMAL HIGH (ref 8–23)
CO2: 29 mmol/L (ref 22–32)
Calcium: 9 mg/dL (ref 8.9–10.3)
Chloride: 107 mmol/L (ref 98–111)
Creatinine, Ser: 2.29 mg/dL — ABNORMAL HIGH (ref 0.44–1.00)
GFR, Estimated: 20 mL/min — ABNORMAL LOW (ref >60)
Glucose, Bld: 82 mg/dL (ref 70–99)
Potassium: 3.5 mmol/L (ref 3.5–5.1)
Sodium: 142 mmol/L (ref 135–145)
Total Bilirubin: 0.5 mg/dL (ref 0.3–1.2)
Total Protein: 5.5 g/dL — ABNORMAL LOW (ref 6.5–8.1)

## 2021-11-27 LAB — TYPE AND SCREEN
ABO/RH(D): O POS
Antibody Screen: NEGATIVE
Unit division: 0

## 2021-11-27 LAB — HEMOGLOBIN AND HEMATOCRIT, BLOOD
HCT: 27.7 % — ABNORMAL LOW (ref 36.0–46.0)
HCT: 30 % — ABNORMAL LOW (ref 36.0–46.0)
Hemoglobin: 8.5 g/dL — ABNORMAL LOW (ref 12.0–15.0)
Hemoglobin: 9.4 g/dL — ABNORMAL LOW (ref 12.0–15.0)

## 2021-11-27 LAB — IRON AND TIBC
Iron: 29 ug/dL (ref 28–170)
Saturation Ratios: 14 % (ref 10.4–31.8)
TIBC: 216 ug/dL — ABNORMAL LOW (ref 250–450)
UIBC: 187 ug/dL

## 2021-11-27 LAB — CBC
HCT: 26.2 % — ABNORMAL LOW (ref 36.0–46.0)
Hemoglobin: 8.3 g/dL — ABNORMAL LOW (ref 12.0–15.0)
MCH: 27.3 pg (ref 26.0–34.0)
MCHC: 31.7 g/dL (ref 30.0–36.0)
MCV: 86.2 fL (ref 80.0–100.0)
Platelets: 253 10*3/uL (ref 150–400)
RBC: 3.04 MIL/uL — ABNORMAL LOW (ref 3.87–5.11)
RDW: 16 % — ABNORMAL HIGH (ref 11.5–15.5)
WBC: 5.9 10*3/uL (ref 4.0–10.5)
nRBC: 0 % (ref 0.0–0.2)

## 2021-11-27 LAB — FERRITIN: Ferritin: 257 ng/mL (ref 11–307)

## 2021-11-27 LAB — BPAM RBC
Blood Product Expiration Date: 202309042359
ISSUE DATE / TIME: 202307282214
Unit Type and Rh: 5100

## 2021-11-27 LAB — FOLATE: Folate: >40 ng/mL (ref >5.9)

## 2021-11-27 MED ORDER — VITAMIN B-12 1000 MCG PO TABS
1000.0000 ug | ORAL_TABLET | Freq: Every day | ORAL | Status: DC
  Administered 2021-11-27 – 2021-11-30 (×4): 1000 ug via ORAL
  Filled 2021-11-27 (×4): qty 1

## 2021-11-27 MED ORDER — LORATADINE 10 MG PO TABS
10.0000 mg | ORAL_TABLET | Freq: Every day | ORAL | Status: DC
  Administered 2021-11-27 – 2021-11-30 (×4): 10 mg via ORAL
  Filled 2021-11-27 (×4): qty 1

## 2021-11-27 MED ORDER — ALUM & MAG HYDROXIDE-SIMETH 200-200-20 MG/5ML PO SUSP: 15.0000 mL | ORAL | Status: DC | PRN

## 2021-11-27 NOTE — Assessment & Plan Note (Signed)
Superimposed on anemia of chronic disease. Hbg 6.5 on admission.  Due to GI bleeding. FOBT positive and pt reported red blood in stool.   Transfused 1 unit pRBC's on admission. Hbg trend: 6.5 >> 8.3 >> 8.5, --GI consulted --Trend Hbg, transfuse if < 7.0 or worsening hemodynamics with active bleeding --Clear liquid diet for now --Pt indicated she would likely decline endoscopic evaluation --Continue IV PPI --Follow up GI recommendations --Monitor on telemetry

## 2021-11-27 NOTE — Hospital Course (Signed)
Lori Stanley is a 88 y.o. female with medical history significant of hypertension, diastolic heart failure, diabetes, GERD, glaucoma, hyperlipidemia, anemia, anxiety, depression, ESRD on HD MWF presented to the ED on 11/26/2021 due to finding of low hemoglobin on outpatient labs.  Pt reported single episode of bright red blood in stool earlier on 7/28, otherwise asymptomatic.  Later endorsed recent generalized weakness and fatigue.  No prior history of GI bleeding per patient.   In the ED, Hbg was 6.5.  Hemodynamically stable.  FOBT was positive.  Pt was typed and screened, transfused 1 unit pRBC's. Admitted to the hospital for further evaluation and management of GI bleeding with acute blood loss anemia.

## 2021-11-27 NOTE — Assessment & Plan Note (Signed)
See ABLA

## 2021-11-27 NOTE — Progress Notes (Signed)
Progress Note   Patient: Lori Stanley:[id number] DOB: [birth year] DOA: 11/26/2021     0 DOS: the patient was seen and examined on 11/27/2021   Brief hospital course:  Lori Stanley is a 88 y.o. female with medical history significant of hypertension, diastolic heart failure, diabetes, GERD, glaucoma, hyperlipidemia, anemia, anxiety, depression, ESRD on HD MWF presented to the ED on 11/26/2021 due to finding of low hemoglobin on outpatient labs.  Pt reported single episode of bright red blood in stool earlier on 7/28, otherwise asymptomatic.  Later endorsed recent generalized weakness and fatigue.  No prior history of GI bleeding per patient.   In the ED, Hbg was 6.5.  Hemodynamically stable.  FOBT was positive.  Pt was typed and screened, transfused 1 unit pRBC's. Admitted to the hospital for further evaluation and management of GI bleeding with acute blood loss anemia.  Assessment and Plan: * ABLA (acute blood loss anemia) Superimposed on anemia of chronic disease. Hbg 6.5 on admission.  Due to GI bleeding. FOBT positive and pt reported red blood in stool.   Transfused 1 unit pRBC's on admission. Hbg trend: 6.5 >> 8.3 >> 8.5, --GI consulted --Trend Hbg, transfuse if < 7.0 or worsening hemodynamics with active bleeding --Clear liquid diet for now --Pt indicated she would likely decline endoscopic evaluation --Continue IV PPI --Follow up GI recommendations --Monitor on telemetry  GI bleed See ABLA   ESRD on HD MWF > Did undergo HD today.  Mild hypokalemia 3.3 in the ED but otherwise renal function electrolytes stable.  We will hold off on any repletion given ESRD status. - Continue home torsemide - Continue home calcitriol - We will need nephrology consult if here through Monday or needs significant transfusion  Diabetes type 2 - SSI  GERD - On IV PPI as above  Glaucoma - Continue home eyedrops  Anxiety Depression - Continue home sertraline and  trazodone  Chronic diastolic CHF - Continue home carvedilol and torsemide - I's and O's, daily weights       Subjective: Pt awake resting in bed this AM.  She was just return from [facility], had a BM with foul odor and appears very dark maroon.  She reports recently being very weak and fatigued.  Otherwise no abdominal pain.   When I ask about endoscopy, she indicates she would prefer to avoid invasive procedures given her age and other health issues.    Physical Exam: Vitals:   11/27/21 0627 11/27/21 0758 11/27/21 1129 11/27/21 1520  BP: (!) 152/74 (!) 160/68 (!) 136/53 (!) 148/62  Pulse: 71 73 66 74  Resp: 16 20 19 19   Temp: 99 F (37.2 C) 98.2 F (36.8 C) 98.3 F (36.8 C) 99.5 F (37.5 C)  TempSrc:      SpO2: 98% 93% 97% 94%   General exam: awake, alert, no acute distress, frail HEENT: pale conjunctiva, moist mucus membranes, hearing grossly normal  Respiratory system: CTAB, no wheezes, rales or rhonchi, normal respiratory effort. Cardiovascular system: normal S1/S2, RRR, no pedal edema.   Gastrointestinal system: melanotic stool in bedside commode, soft, NT, ND, no HSM felt, +bowel sounds. Central nervous system: A&O x4. no gross focal neurologic deficits, normal speech Extremities: moves all, no edema, normal tone Skin: dry, intact, normal temperature Psychiatry: normal mood, congruent affect, judgement and insight appear normal   Data Reviewed:  Notable labs --- Hbg 6.5 >> 8.3 >> 8.5,  BUN 24, Cr 2.29, albumin 2.8, GFR 20, TIBS 216 otherwise normal iron  panel,    Family Communication: none  Disposition: Status is: Observation The patient remains OBS appropriate and will d/c before 2 midnights.  Planned Discharge Destination: Home    Time spent: 40 minutes  Author: Ezekiel Slocumb, DO 11/27/2021 5:33 PM  For on call review [email or web address].

## 2021-11-27 NOTE — Consult Note (Signed)
Cephas Darby, MD [address]  [place], [place] [address]  Main: [phone]  Fax: [phone] Pager: [phone]   Consultation  Referring Provider:     No ref. provider found Primary Care Physician:  Jerrol Banana., MD Primary Gastroenterologist: Althia Forts      Reason for Consultation: Melena  Date of Admission:  11/26/2021 Date of Consultation:  11/27/2021         HPI:   Lori Stanley is a 88 y.o. female history of CHF, ESRD on hemodialysis, diabetes admitted with severe anemia.  Patient was informed by her PCP to go to the emergency room due to low hemoglobin, 6.4 done at the dialysis center yesterday.  Patient reports having dark stools for the last 1 week intermittently.  She denies any abdominal pain, nausea, vomiting, rectal bleeding.  Patient has been hemodynamically stable in the ER.  She received 2 units of PRBCs.  Patient denies taking any oral iron supplements as outpatient.  Patient does not smoke or drink alcohol  NSAIDs: None  Antiplts/Anticoagulants/Anti thrombotics: None  GI Procedures: None  Past Medical History:  Diagnosis Date   Acute heart failure (HCC)    Acute on chronic heart failure with preserved ejection fraction (HFpEF) ([place]) 06/05/2019   Anemia    Anemia due to stage 4 chronic kidney disease ([place]) 08/27/2019   Anxiety    CHF (congestive heart failure) (HCC)    Chronic kidney disease    stage V; end stage renal disease   Chronic kidney disease, stage 5 ([place]) 01/23/2019   Chronic low back pain    Depression    Diabetes mellitus without complication (HCC)    Essential hypertension, malignant 05/24/2013   GERD (gastroesophageal reflux disease)    Hematoma 07/17/2020   Hyperlipidemia    Hypertension    MI (myocardial infarction) ([place])    Obesity    Proteinuria 01/23/2019   Respiratory failure ([place]) 04/09/2017   SOB (shortness of breath) 05/24/2013   Unilateral inguinal hernia with obstruction and without  gangrene    Volume overload 08/30/2019    Past Surgical History:  Procedure Laterality Date   A/V FISTULAGRAM Left 01/15/2020   Procedure: A/V FISTULAGRAM;  Surgeon: Katha Cabal, MD;  Location: [facility];  Service: Cardiovascular;  Laterality: Left;   ABDOMINAL HYSTERECTOMY     APPENDECTOMY     AV FISTULA PLACEMENT Left 11/13/2019   Procedure: INSERTION OF ARTERIOVENOUS (AV) GORE-TEX GRAFT ARM ( BRACHIAL AXILLARY );  Surgeon: Katha Cabal, MD;  Location: [facility];  Service: Vascular;  Laterality: Left;   DIALYSIS/PERMA CATHETER INSERTION N/A 06/07/2019   Procedure: DIALYSIS/PERMA CATHETER INSERTION;  Surgeon: Katha Cabal, MD;  Location: [facility];  Service: Cardiovascular;  Laterality: N/A;   DIALYSIS/PERMA CATHETER REMOVAL N/A 02/11/2020   Procedure: DIALYSIS/PERMA CATHETER REMOVAL;  Surgeon: Katha Cabal, MD;  Location: [facility];  Service: Cardiovascular;  Laterality: N/A;   EYE SURGERY     HERNIA REPAIR     INCISION AND DRAINAGE PERIRECTAL ABSCESS     INGUINAL HERNIA REPAIR N/A 07/12/2020   Procedure: HERNIA REPAIR INGUINAL ADULT;  Surgeon: Olean Ree, MD;  Location: [facility];  Service: General;  Laterality: N/A;   KNEE SURGERY     THYROID SURGERY     VAGINAL HYSTERECTOMY       Current Facility-Administered Medications:    0.9 %  sodium chloride infusion, 10 mL/hr, Intravenous, Once, Neva Seat  B, MD, Held at 11/26/21 2033   acetaminophen (TYLENOL) tablet 650 mg, 650 mg, Oral, Q6H PRN **OR** acetaminophen (TYLENOL) suppository 650 mg, 650 mg, Rectal, Q6H PRN, Marcelyn Bruins, MD   albuterol (PROVENTIL) (2.5 MG/3ML) 0.083% nebulizer solution 2.5 mg, 2.5 mg, Inhalation, Q6H PRN, Marcelyn Bruins, MD   brimonidine (ALPHAGAN) 0.2 % ophthalmic solution 1 drop, 1 drop, Both Eyes, BID, Marcelyn Bruins, MD, 1 drop at 11/27/21 0931   [START ON 11/29/2021] calcitRIOL (ROCALTROL) capsule 0.25 mcg, 0.25 mcg, Oral, Q  M,W,F, Marcelyn Bruins, MD   carvedilol (COREG) tablet 12.5 mg, 12.5 mg, Oral, BID, Marcelyn Bruins, MD, 12.5 mg at 11/27/21 6599   cyanocobalamin (VITAMIN B12) tablet 1,000 mcg, 1,000 mcg, Oral, Daily, Sidhant Helderman, Tally Due, MD   dorzolamide-timolol (COSOPT) 22.3-6.8 MG/ML ophthalmic solution 1 drop, 1 drop, Both Eyes, BID, Marcelyn Bruins, MD, 1 drop at 11/27/21 0932   latanoprost (XALATAN) 0.005 % ophthalmic solution 1 drop, 1 drop, Both Eyes, QHS, Marcelyn Bruins, MD, 1 drop at 11/26/21 2316   loratadine (CLARITIN) tablet 10 mg, 10 mg, Oral, Daily, Nicole Kindred A, DO, 10 mg at 11/27/21 1233   multivitamin (RENA-VIT) tablet 1 tablet, 1 tablet, Oral, Daily, Marcelyn Bruins, MD, 1 tablet at 11/27/21 0926   olopatadine (PATANOL) 0.1 % ophthalmic solution 1 drop, 1 drop, Both Eyes, BID, Marcelyn Bruins, MD, 1 drop at 11/27/21 0932   [START ON 11/30/2021] pantoprazole (PROTONIX) injection 40 mg, 40 mg, Intravenous, Q12H, Marcelyn Bruins, MD   pantoprozole (PROTONIX) 80 mg /NS 100 mL infusion, 8 mg/hr, Intravenous, Continuous, Marcelyn Bruins, MD, Last Rate: 10 mL/hr at 11/27/21 3570, 8 mg/hr at 11/27/21 0921   polyethylene glycol (MIRALAX / GLYCOLAX) packet 17 g, 17 g, Oral, Daily PRN, Marcelyn Bruins, MD   sertraline (ZOLOFT) tablet 25 mg, 25 mg, Oral, Daily, Marcelyn Bruins, MD, 25 mg at 11/27/21 1779   sodium chloride flush (NS) 0.9 % injection 3 mL, 3 mL, Intravenous, Q12H, Marcelyn Bruins, MD, 3 mL at 11/27/21 3903   torsemide [facility]) tablet 100 mg, 100 mg, Oral, Daily, Marcelyn Bruins, MD, 100 mg at 11/27/21 0092   traZODone (DESYREL) tablet 150 mg, 150 mg, Oral, QHS, Marcelyn Bruins, MD, 150 mg at 11/26/21 2312   Family History  Problem Relation Age of Onset   Heart attack Mother    Heart disease Sister    Cancer Sister      Social History   Tobacco Use   Smoking status: Former    Types: Cigarettes   Smokeless tobacco: Never    Tobacco comments:    Quit in 2015-2016  Vaping Use   Vaping Use: Never used  Substance Use Topics   Alcohol use: No   Drug use: No    Allergies as of 11/26/2021 - Review Complete 11/26/2021  Allergen Reaction Noted   Antihistamines, chlorpheniramine-type Other (See Comments) 04/14/2015   Paxil [paroxetine]  06/16/2016   Codeine Rash and Other (See Comments) 05/22/2013    Review of Systems:    All systems reviewed and negative except where noted in HPI.   Physical Exam:  Vital signs in last 24 hours: Temp:  [98.1 F (36.7 C)-99.5 F (37.5 C)] 99.5 F (37.5 C) (07/29 1520) Pulse Rate:  [65-84] 74 (07/29 1520) Resp:  [16-25] 19 (07/29 1520) BP: (119-160)/(43-74) 148/62 (07/29 1520) SpO2:  [93 %-100 %] 94 % (07/29 1520) Last BM Date : 11/26/21 General:  Pleasant, cooperative in NAD Head:  Normocephalic and atraumatic. Eyes:   No icterus.   Conjunctiva pink. PERRLA. Ears:  Normal auditory acuity. Neck:  Supple; no masses or thyroidomegaly Lungs: Respirations even and unlabored. Lungs clear to auscultation bilaterally.   No wheezes, crackles, or rhonchi.  Heart:  Regular rate and rhythm;  Without murmur, clicks, rubs or gallops Abdomen:  Soft, nondistended, nontender. Normal bowel sounds. No appreciable masses or hepatomegaly.  No rebound or guarding.  Rectal:  Not performed. Msk:  Symmetrical without gross deformities.  Strength generalized weakness Extremities:  Without edema, cyanosis or clubbing. Neurologic:  Alert and oriented x3;  grossly normal neurologically. Skin:  Intact without significant lesions or rashes. Psych:  Alert and cooperative. Normal affect.  LAB RESULTS:    Latest Ref Rng & Units 11/27/2021   12:24 PM 11/27/2021    5:01 AM 11/26/2021    6:48 PM  CBC  WBC 4.0 - 10.5 K/uL  5.9  5.5   Hemoglobin 12.0 - 15.0 g/dL 8.5  8.3  6.5   Hematocrit 36.0 - 46.0 % 27.7  26.2  21.8   Platelets 150 - 400 K/uL  253  266     BMET    Latest Ref Rng & Units  11/27/2021    5:01 AM 11/26/2021    6:48 PM 09/21/2021   11:14 AM  BMP  Glucose 70 - 99 mg/dL 82  141  146   BUN 8 - 23 mg/dL 24  23  31    Creatinine 0.44 - 1.00 mg/dL 2.29  1.93  3.17   BUN/Creat Ratio 12 - 28   10   Sodium 135 - 145 mmol/L 142  143  139   Potassium 3.5 - 5.1 mmol/L 3.5  3.3  3.9   Chloride 98 - 111 mmol/L 107  104  99   CO2 22 - 32 mmol/L 29  29  24    Calcium 8.9 - 10.3 mg/dL 9.0  8.9  10.4     LFT    Latest Ref Rng & Units 11/27/2021    5:01 AM 11/26/2021    6:48 PM 09/21/2021   11:14 AM  Hepatic Function  Total Protein 6.5 - 8.1 g/dL 5.5  6.0  6.5   Albumin 3.5 - 5.0 g/dL 2.8  3.2  4.1   AST 15 - 41 U/L 15  21  16    ALT 0 - 44 U/L 12  13  12    Alk Phosphatase 38 - 126 U/L 92  94  134   Total Bilirubin 0.3 - 1.2 mg/dL 0.5  0.5  <0.2      STUDIES: No results found.    Impression / Plan:   CHEMEKA FILICE is a 88 y.o. pleasant African-American female with history of renal disease on hemodialysis, CHF, hypertension presented with acute on chronic anemia and reported history of intermittent dark stools  Acute on chronic anemia with 1 week history of dark stools Patient is hemodynamically stable, s/p units of PRBCs, responded appropriately to blood transfusion Monitor CBC closely, maintain hemoglobin above 8 Okay to continue pantoprazole drip Patient has history of severe B12 deficiency, recommend to recheck vitamin B12 levels  Start oral B12 1000 mcg daily Discussed with patient regarding upper endoscopy for further evaluation and she refused to undergo any GI procedures at this time.  She said she will think about it.  Informed Dr. Arbutus Ped Diet as tolerated Please call GI back if patient is agreeable to undergo endoscopic evaluation  Thank you for involving me in the care of this patient.      LOS: 0 days   Sherri Sear, MD  11/27/2021, 5:48 PM    Note: This dictation was prepared with Dragon dictation along with smaller phrase technology. Any  transcriptional errors that result from this process are unintentional.

## 2021-11-28 DIAGNOSIS — D62 Acute posthemorrhagic anemia: Secondary | ICD-10-CM | POA: Diagnosis not present

## 2021-11-28 LAB — HEMOGLOBIN AND HEMATOCRIT, BLOOD
HCT: 29.5 % — ABNORMAL LOW (ref 36.0–46.0)
Hemoglobin: 9 g/dL — ABNORMAL LOW (ref 12.0–15.0)

## 2021-11-28 LAB — CBC
HCT: 28.1 % — ABNORMAL LOW (ref 36.0–46.0)
Hemoglobin: 8.8 g/dL — ABNORMAL LOW (ref 12.0–15.0)
MCH: 27 pg (ref 26.0–34.0)
MCHC: 31.3 g/dL (ref 30.0–36.0)
MCV: 86.2 fL (ref 80.0–100.0)
Platelets: 254 10*3/uL (ref 150–400)
RBC: 3.26 MIL/uL — ABNORMAL LOW (ref 3.87–5.11)
RDW: 16 % — ABNORMAL HIGH (ref 11.5–15.5)
WBC: 6.2 10*3/uL (ref 4.0–10.5)
nRBC: 0 % (ref 0.0–0.2)

## 2021-11-28 LAB — VITAMIN B12: Vitamin B-12: 324 pg/mL (ref 180–914)

## 2021-11-28 NOTE — TOC Progression Note (Signed)
Transition of Care [facility]) - Progression Note    Patient Details  Name: Lori Stanley MRN: [id number] Date of Birth: [birth year]  Transition of Care [facility]) CM/SW Contact  Izola Price, RN Phone Number: 11/28/2021, 12:45 PM  Clinical Narrative:  7/30: Asked by PT to check on status of obtaining a new walker per patient request. Patient does have one currently. [employer] indicated she received one in the last year so not eligible via insurance but patient could private pay for $42.00. Updated PT via secure chat. Simmie Davies RN CM          Expected Discharge Plan and Services                                                 Social Determinants of Health (SDOH) Interventions    Readmission Risk Interventions    07/20/2020    3:45 PM 09/03/2019    2:21 PM  Readmission Risk Prevention Plan  Transportation Screening Complete Complete  PCP or Specialist Appt within 3-5 Days Complete   HRI or Home Care Consult Complete   Palliative Care Screening Not Applicable Not Applicable  Medication Review (RN Care Manager) Complete Complete

## 2021-11-28 NOTE — Evaluation (Signed)
Physical Therapy Evaluation Patient Details Name: Lori Stanley MRN: [id number] DOB: 1932/02/03 Today's Date: 11/28/2021  History of Present Illness  Pt is an 88 y/o F admitted on 11/26/21 after presenting to the ED 2/2 finding of low Hgb on oupatient labs. Pt reported single episode of bright red pblood in stool earlier on 7/28, otherwise asymptomatic. Pt was admitted for management of ABLA & transfused 1 unit of pRBCs. PMH: HTN, diastolic heart failure, DM, GERD, glaucoma, HLD, anemia, anxiety, depression, ESRD on HD MWF  Clinical Impression  Pt seen for PT evaluation with pt agreeable to tx. Pt reports prior to admission she was living alone in a 1 level home with 3-4 steps with B rails to enter, ambulatory with RW, & family assisted with transportation. On this date, pt is mod I for bed mobility & transfers STS without assistance. Pt ambulates into hallway with decreased gait speed but mod I with RW. Pt does endorse SOB after gait but notes this has been happening at dialysis too. SpO2 after gait as low as 81% but then 85%, returns to 91-95% semi reclined in bed -- nurse & MD notified of O2 readings. Pt declined f/u HHPT but will continue to follow pt acutely to address endurance & high level balance.     Recommendations for follow up therapy are one component of a multi-disciplinary discharge planning process, led by the attending physician.  Recommendations may be updated based on patient status, additional functional criteria and insurance authorization.  Follow Up Recommendations  (pt declines f/u services)      Assistance Recommended at Discharge Intermittent Supervision/Assistance  Patient can return home with the following  Assist for transportation;Help with stairs or ramp for entrance    Equipment Recommendations None recommended by PT  Recommendations for Other Services       Functional Status Assessment Patient has had a recent decline in their functional status and  demonstrates the ability to make significant improvements in function in a reasonable and predictable amount of time.     Precautions / Restrictions Precautions Precautions: Fall Restrictions Weight Bearing Restrictions: No      Mobility  Bed Mobility Overal bed mobility: Modified Independent             General bed mobility comments: supine>sit with HOB elevated    Transfers Overall transfer level: Modified independent Equipment used: Rolling walker (2 wheels)               General transfer comment: STS with extra time to power up    Ambulation/Gait Ambulation/Gait assistance: Modified independent (Device/Increase time) Gait Distance (Feet): 80 Feet Assistive device: Rolling walker (2 wheels) Gait Pattern/deviations: Decreased stride length Gait velocity: decreased        Stairs            Wheelchair Mobility    Modified Rankin (Stroke Patients Only)       Balance Overall balance assessment: Needs assistance Sitting-balance support: Feet supported Sitting balance-Leahy Scale: Good     Standing balance support: During functional activity, Bilateral upper extremity supported Standing balance-Leahy Scale: Good                               Pertinent Vitals/Pain Pain Assessment Pain Assessment: Faces Faces Pain Scale: Hurts a little bit Pain Location: soreness at IV site Pain Descriptors / Indicators: Sore Pain Intervention(s): Monitored during session    Home Living Family/patient expects to be discharged  to:: Private residence Living Arrangements: Alone Available Help at Discharge: Family Type of Home: House Home Access: Stairs to enter Entrance Stairs-Rails: Right;Can reach Software engineer of Steps: 3-4   Home Layout: One level Home Equipment: Conservation officer, nature (2 wheels)      Prior Function               Mobility Comments: Pt reports she was mod I with RW, denies falls in the past 6  months. ADLs Comments: cooks simple meals, family assists with transportation     Hand Dominance        Extremity/Trunk Assessment   Upper Extremity Assessment Upper Extremity Assessment: Overall WFL for tasks assessed    Lower Extremity Assessment Lower Extremity Assessment: Generalized weakness       Communication   Communication: No difficulties  Cognition Arousal/Alertness: Awake/alert Behavior During Therapy: WFL for tasks assessed/performed Overall Cognitive Status: Within Functional Limits for tasks assessed                                 General Comments: sweet lady        General Comments General comments (skin integrity, edema, etc.): BP at beginning of session in RUE 146/64 mmHg    Exercises     Assessment/Plan    PT Assessment Patient needs continued PT services  PT Problem List Decreased strength;Cardiopulmonary status limiting activity;Decreased mobility;Decreased balance;Decreased activity tolerance       PT Treatment Interventions Therapeutic exercise;DME instruction;Gait training;Balance training;Stair training;Neuromuscular re-education;Functional mobility training;Therapeutic activities;Patient/family education    PT Goals (Current goals can be found in the Care Plan section)  Acute Rehab PT Goals Patient Stated Goal: go home PT Goal Formulation: With patient Time For Goal Achievement: 12/12/21 Potential to Achieve Goals: Good    Frequency Min 2X/week     Co-evaluation               AM-PAC PT "6 Clicks" Mobility  Outcome Measure Help needed turning from your back to your side while in a flat bed without using bedrails?: None Help needed moving from lying on your back to sitting on the side of a flat bed without using bedrails?: A Little Help needed moving to and from a bed to a chair (including a wheelchair)?: A Little Help needed standing up from a chair using your arms (e.g., wheelchair or bedside chair)?:  None Help needed to walk in hospital room?: A Little Help needed climbing 3-5 steps with a railing? : A Little 6 Click Score: 20    End of Session   Activity Tolerance: Patient tolerated treatment well Patient left: with call bell/phone within reach;in bed;with bed alarm set Nurse Communication: Mobility status (O2) PT Visit Diagnosis: Muscle weakness (generalized) (M62.81)    Time: 9935-7017 PT Time Calculation (min) (ACUTE ONLY): 23 min   Charges:   PT Evaluation $PT Eval Low Complexity: [place], PT, DPT 11/28/21, 12:32 PM   Waunita Schooner 11/28/2021, 12:31 PM

## 2021-11-28 NOTE — Progress Notes (Signed)
Progress Note   Patient: Lori Stanley LZJ:[id number] DOB: [birth year] DOA: 11/26/2021     0 DOS: the patient was seen and examined on 11/28/2021   Brief hospital course:  KATELEEN ENCARNACION is a 88 y.o. female with medical history significant of hypertension, diastolic heart failure, diabetes, GERD, glaucoma, hyperlipidemia, anemia, anxiety, depression, ESRD on HD MWF presented to the ED on 11/26/2021 due to finding of low hemoglobin on outpatient labs.  Pt reported single episode of bright red blood in stool earlier on 7/28, otherwise asymptomatic.  Later endorsed recent generalized weakness and fatigue.  No prior history of GI bleeding per patient.   In the ED, Hbg was 6.5.  Hemodynamically stable.  FOBT was positive.  Pt was typed and screened, transfused 1 unit pRBC's. Admitted to the hospital for further evaluation and management of GI bleeding with acute blood loss anemia.  Assessment and Plan: * ABLA (acute blood loss anemia) Superimposed on anemia of chronic disease. Hbg 6.5 on admission.  Due to GI bleeding. FOBT positive and pt reported red blood in stool.   Transfused 1 unit pRBC's on admission. Hbg trend: 6.5 >> 8.3 >> 8.5 >> 9.4 >> 8.8 >> 9.0 --GI consulted --EGD recommended but patient is thinking about whether or not to proceed --Trend Hbg, transfuse if < 7.0 or worsening hemodynamics with active bleeding -- Diet per GI, currently on full liquids --Continue IV PPI --Follow up GI recommendations --Monitor on telemetry  GI bleed See ABLA   ESRD on HD MWF > Did undergo HD today.  Mild hypokalemia 3.3 in the ED but otherwise renal function electrolytes stable.  We will hold off on any repletion given ESRD status. - Continue home torsemide - Continue home calcitriol - We will need nephrology consult if here through Monday or needs significant transfusion  Diabetes type 2 - SSI  GERD - On IV PPI as above  Glaucoma - Continue home  eyedrops  Anxiety Depression - Continue home sertraline and trazodone  Chronic diastolic CHF - Continue home carvedilol and torsemide - I's and O's, daily weights       Subjective: Pt awake resting in bed when seen on rounds.  She reports having another bowel movement that was similar in color and odor to yesterday morning when she had melena.  She asks a lot of questions about EGD and what that procedure is like because she is still thinking about whether or not she wants to proceed.  She would not want any major surgery, says she might be agreeable to EGD but wants to wait till GI sees her today.  Physical Exam: Vitals:   11/28/21 0514 11/28/21 0517 11/28/21 0900 11/28/21 1108  BP:   (!) 180/75 (!) 146/64  Pulse: 77 72 76 72  Resp:   18 18  Temp:   98.2 F (36.8 C) 99.2 F (37.3 C)  TempSrc:      SpO2: 91% 93% 91% 96%  Weight:       General exam: awake, alert, no acute distress, frail HEENT: Hearing grossly normal, moist mucous membranes Respiratory system: Lungs clear bilaterally, normal respiratory effort, on room air Cardiovascular system: Regular rate and rhythm, no peripheral edema.   Gastrointestinal system: Abdomen soft nontender nondistended Central nervous system: A&O x4. no gross focal neurologic deficits, normal speech Extremities: moves all, no edema, normal tone Skin: dry, intact, normal temperature Psychiatry: normal mood, congruent affect, judgement and insight appear normal   Data Reviewed:  Notable labs: CBC this morning  hemoglobin 8.8, repeat hemoglobin this afternoon 9.0  Family Communication: none  Disposition: Status is: Observation The patient remains OBS appropriate and will d/c before 2 midnights.  Planned Discharge Destination: Home  Patient remains in the hospital due to recurrent melena, closely monitoring, patient considering EGD as recommended by GI.  Anticipate discharge after dialysis tomorrow if hemoglobin stable and EGD is  deferred.      Time spent: 35 minutes  Author: Ezekiel Slocumb, DO 11/28/2021 5:06 PM  For on call review [email or web address].

## 2021-11-29 DIAGNOSIS — F419 Anxiety disorder, unspecified: Secondary | ICD-10-CM | POA: Diagnosis present

## 2021-11-29 DIAGNOSIS — K219 Gastro-esophageal reflux disease without esophagitis: Secondary | ICD-10-CM | POA: Diagnosis present

## 2021-11-29 DIAGNOSIS — K449 Diaphragmatic hernia without obstruction or gangrene: Secondary | ICD-10-CM | POA: Diagnosis present

## 2021-11-29 DIAGNOSIS — K222 Esophageal obstruction: Secondary | ICD-10-CM | POA: Diagnosis present

## 2021-11-29 DIAGNOSIS — K2971 Gastritis, unspecified, with bleeding: Secondary | ICD-10-CM | POA: Diagnosis present

## 2021-11-29 DIAGNOSIS — I5032 Chronic diastolic (congestive) heart failure: Secondary | ICD-10-CM | POA: Diagnosis present

## 2021-11-29 DIAGNOSIS — Z888 Allergy status to other drugs, medicaments and biological substances status: Secondary | ICD-10-CM | POA: Diagnosis not present

## 2021-11-29 DIAGNOSIS — E1122 Type 2 diabetes mellitus with diabetic chronic kidney disease: Secondary | ICD-10-CM | POA: Diagnosis present

## 2021-11-29 DIAGNOSIS — Z885 Allergy status to narcotic agent status: Secondary | ICD-10-CM | POA: Diagnosis not present

## 2021-11-29 DIAGNOSIS — I132 Hypertensive heart and chronic kidney disease with heart failure and with stage 5 chronic kidney disease, or end stage renal disease: Secondary | ICD-10-CM | POA: Diagnosis present

## 2021-11-29 DIAGNOSIS — I252 Old myocardial infarction: Secondary | ICD-10-CM | POA: Diagnosis not present

## 2021-11-29 DIAGNOSIS — Z992 Dependence on renal dialysis: Secondary | ICD-10-CM | POA: Diagnosis not present

## 2021-11-29 DIAGNOSIS — E785 Hyperlipidemia, unspecified: Secondary | ICD-10-CM | POA: Diagnosis present

## 2021-11-29 DIAGNOSIS — F32A Depression, unspecified: Secondary | ICD-10-CM | POA: Diagnosis present

## 2021-11-29 DIAGNOSIS — Z79899 Other long term (current) drug therapy: Secondary | ICD-10-CM | POA: Diagnosis not present

## 2021-11-29 DIAGNOSIS — D631 Anemia in chronic kidney disease: Secondary | ICD-10-CM | POA: Diagnosis present

## 2021-11-29 DIAGNOSIS — H409 Unspecified glaucoma: Secondary | ICD-10-CM | POA: Diagnosis present

## 2021-11-29 DIAGNOSIS — Z87891 Personal history of nicotine dependence: Secondary | ICD-10-CM | POA: Diagnosis not present

## 2021-11-29 DIAGNOSIS — E876 Hypokalemia: Secondary | ICD-10-CM | POA: Diagnosis present

## 2021-11-29 DIAGNOSIS — N186 End stage renal disease: Secondary | ICD-10-CM | POA: Diagnosis present

## 2021-11-29 DIAGNOSIS — G8929 Other chronic pain: Secondary | ICD-10-CM | POA: Diagnosis present

## 2021-11-29 DIAGNOSIS — K922 Gastrointestinal hemorrhage, unspecified: Secondary | ICD-10-CM | POA: Diagnosis present

## 2021-11-29 DIAGNOSIS — K31819 Angiodysplasia of stomach and duodenum without bleeding: Secondary | ICD-10-CM | POA: Diagnosis not present

## 2021-11-29 DIAGNOSIS — D62 Acute posthemorrhagic anemia: Secondary | ICD-10-CM | POA: Diagnosis present

## 2021-11-29 DIAGNOSIS — K921 Melena: Secondary | ICD-10-CM | POA: Diagnosis not present

## 2021-11-29 LAB — BASIC METABOLIC PANEL
Anion gap: 8 (ref 5–15)
BUN: 29 mg/dL — ABNORMAL HIGH (ref 8–23)
CO2: 23 mmol/L (ref 22–32)
Calcium: 9.2 mg/dL (ref 8.9–10.3)
Chloride: 109 mmol/L (ref 98–111)
Creatinine, Ser: 3.52 mg/dL — ABNORMAL HIGH (ref 0.44–1.00)
GFR, Estimated: 12 mL/min — ABNORMAL LOW (ref >60)
Glucose, Bld: 87 mg/dL (ref 70–99)
Potassium: 3.5 mmol/L (ref 3.5–5.1)
Sodium: 140 mmol/L (ref 135–145)

## 2021-11-29 LAB — HEMOGLOBIN AND HEMATOCRIT, BLOOD
HCT: 28.1 % — ABNORMAL LOW (ref 36.0–46.0)
Hemoglobin: 8.7 g/dL — ABNORMAL LOW (ref 12.0–15.0)

## 2021-11-29 LAB — HEPATITIS B CORE ANTIBODY, IGM: Hep B C IgM: NONREACTIVE

## 2021-11-29 LAB — HEPATITIS B SURFACE ANTIGEN: Hepatitis B Surface Ag: NONREACTIVE

## 2021-11-29 LAB — HEPATITIS B CORE ANTIBODY, TOTAL: Hep B Core Total Ab: NONREACTIVE

## 2021-11-29 MED ORDER — PENTAFLUOROPROP-TETRAFLUOROETH EX AERO
INHALATION_SPRAY | CUTANEOUS | Status: AC
  Filled 2021-11-29: qty 30

## 2021-11-29 MED ORDER — ALTEPLASE 2 MG IJ SOLR: 2.0000 mg | Freq: Once | INTRAMUSCULAR | Status: DC | PRN

## 2021-11-29 MED ORDER — LIDOCAINE HCL (PF) 1 % IJ SOLN: 5.0000 mL | INTRAMUSCULAR | Status: DC | PRN

## 2021-11-29 MED ORDER — CHLORHEXIDINE GLUCONATE CLOTH 2 % EX PADS
6.0000 | MEDICATED_PAD | Freq: Every day | CUTANEOUS | Status: DC
  Administered 2021-11-29 – 2021-11-30 (×2): 6 via TOPICAL

## 2021-11-29 MED ORDER — HEPARIN SODIUM (PORCINE) 1000 UNIT/ML DIALYSIS: 1000.0000 [IU] | INTRAMUSCULAR | Status: DC | PRN

## 2021-11-29 MED ORDER — LIDOCAINE-PRILOCAINE 2.5-2.5 % EX CREA: 1.0000 | TOPICAL_CREAM | CUTANEOUS | Status: DC | PRN

## 2021-11-29 MED ORDER — ANTICOAGULANT SODIUM CITRATE 4% (200MG/5ML) IV SOLN: 5.0000 mL | Status: DC | PRN

## 2021-11-29 MED ORDER — PENTAFLUOROPROP-TETRAFLUOROETH EX AERO: 1.0000 | INHALATION_SPRAY | CUTANEOUS | Status: DC | PRN

## 2021-11-29 NOTE — Progress Notes (Signed)
  Progress Note   Patient: Lori Stanley IHK:[id number] DOB: [birth year] DOA: 11/26/2021     0 DOS: the patient was seen and examined on 11/29/2021   Brief hospital course:  Lori Stanley is a 88 y.o. female with medical history significant of hypertension, diastolic heart failure, diabetes, GERD, glaucoma, hyperlipidemia, anemia, anxiety, depression, ESRD on HD MWF presented to the ED on 11/26/2021 due to finding of low hemoglobin on outpatient labs.  Pt reported single episode of bright red blood in stool earlier on 7/28, otherwise asymptomatic.  Later endorsed recent generalized weakness and fatigue.  No prior history of GI bleeding per patient.   In the ED, Hbg was 6.5.  Hemodynamically stable.  FOBT was positive.  Pt was typed and screened, transfused 1 unit pRBC's. Admitted to the hospital for further evaluation and management of GI bleeding with acute blood loss anemia.  Assessment and Plan: * ABLA (acute blood loss anemia) Superimposed on anemia of chronic disease. Hbg 6.5 on admission.  Due to GI bleeding. FOBT positive and pt reported red blood in stool.   Transfused 1 unit pRBC's on admission. Hbg trend: 6.5 >> 8.3 >> 8.5 >> 9.4 >> 8.8 >> 9.0 --GI consulted --EGD planned for tomrrow --Trend Hbg, transfuse if < 7.0 or worsening hemodynamics with active bleeding -- Diet per GI, currently on full liquids, NPO after 5 AM tomorrow --Continue IV PPI   GI bleed See ABLA   ESRD on HD MWF > Did undergo HD today.   - Nephrology following - Continue home torsemide - Continue home calcitriol   Diabetes type 2 - SSI  GERD - On IV PPI as above  Glaucoma - Continue home eyedrops  Anxiety Depression - Continue home sertraline and trazodone  Chronic diastolic CHF - Continue home carvedilol and torsemide - I's and O's, daily weights       Subjective: Pt awake resting in bed when seen on rounds.  She is agreeable to EGD after thinking about it.   Denies abdominal  pain, N/V.  Has dialysis today.  No other acute complaints.   Physical Exam: Vitals:   11/29/21 1530 11/29/21 1600 11/29/21 1625 11/29/21 1705  BP: (!) 162/60 (!) 156/59 (!) 151/65   Pulse: (!) 59 61 64   Resp: 20 18 (!) 21   Temp:   98.2 F (36.8 C)   TempSrc:   Oral   SpO2:      Weight:    49.2 kg   General exam: awake, alert, no acute distress, frail HEENT: Hearing grossly normal, moist mucous membranes Respiratory system: normal respiratory effort, on room air Cardiovascular system: Regular rate and rhythm, no peripheral edema.   Gastrointestinal system: Abdomen soft nontender nondistended Central nervous system: A&O x4. no gross focal neurologic deficits, normal speech Extremities: moves all, no edema, normal tone Psychiatry: normal mood, congruent affect, judgement and insight appear normal   Data Reviewed:  Notable labs: BUN 29, Cr 3.52, Hbg 8.7, Hct 28.1  Family Communication: none  Disposition: Status is: Inpatient Remains inpatient appropriate because: ongoing evaluation with EGD planned for tomorrow       Planned Discharge Destination: Home  Patient remains in the hospital due to recurrent melena, closely monitoring, patient considering EGD as recommended by GI.  Anticipate discharge after dialysis tomorrow if hemoglobin stable and EGD is deferred.      Time spent: 35 minutes  Author: Ezekiel Slocumb, DO 11/29/2021 5:36 PM  For on call review [email or web address].

## 2021-11-29 NOTE — Progress Notes (Signed)
Received patient in bed to unit.  Alert and oriented.  Informed consent signed and in  chart.   Treatment initiated: 1300 Treatment completed: 1625  Patient tolerated well.  Transported back to the room  alert, without acute distress.  Hand-off given to patient's nurse.   Access used: Right Upper AVG Access issues: none  Total UF removed: 500 ML Medication(s) given: none Post HD VS: 151/65   Hr 64   Rr 21   Tmp 98.2  Post HD weight: 49.2 [facility]

## 2021-11-29 NOTE — Progress Notes (Signed)
[facility]  ROUNDING NOTE   Subjective:   Lori Stanley is a 88 year old female with past medical history including hyperlipidemia, diabetes, GERD, anemia, diastolic heart failure, hypertension, and end-stage renal disease on hemodialysis.  Patient presents to the emergency department with abnormal labs.  Patient was found to be anemic and admitted under observation for GI bleed [K92.2] Anemia, unspecified type [D64.9]  Patient is known to our practice and receives outpatient dialysis treatments at [facility] on a Monday Wednesday Friday schedule, supervised by Dr. Candiss Norse.  Patient currently seen sitting up in chair.  Patient states she missed treatment on Friday due to generalized malaise.  Patient states she has progressively felt weakness during the week.  Denies sick contacts.  Denies change in appetite, nausea, vomiting, or diarrhea.  Reports mild shortness of breath with activity.  Denies known fever or chills.  States no episodes of dark or bloody stool until yesterday.  Labs on admission concerning for potassium of 3.3, albumin 3.2, hemoglobin 6.3 with recheck 6.5.  Patient has received 1 unit blood transfusion.  We have been consulted to manage dialysis needs.   Objective:  Vital signs in last 24 hours:  Temp:  [98 F (36.7 C)-99.3 F (37.4 C)] 98.7 F (37.1 C) (07/31 0831) Pulse Rate:  [62-71] 70 (07/31 0831) Resp:  [16-18] 18 (07/31 0948) BP: (142-163)/(63-70) 159/64 (07/31 0831) SpO2:  [85 %-92 %] 92 % (07/31 0831) Weight:  [53.1 kg] 53.1 kg (07/31 0438)  Weight change: 2.5 kg Filed Weights   11/28/21 0500 11/29/21 0438  Weight: 50.6 kg 53.1 kg    Intake/Output: I/O last 3 completed shifts: In: 52 [P.O.:240; I.V.:40] Out: -    Intake/Output this shift:  Total I/O In: 120 [P.O.:120] Out: -   Physical Exam: General: NAD, sitting in chair  Head: Normocephalic, atraumatic. Moist oral mucosal membranes  Eyes: Anicteric  Lungs:  Clear to  auscultation, normal effort, room air  Heart: Regular rate and rhythm  Abdomen:  Soft, nontender  Extremities: No peripheral edema.  Neurologic: Nonfocal, moving all four extremities  Skin: No lesions  Access: None    Basic Metabolic Panel: Recent Labs  Lab 11/26/21 1848 11/27/21 0501 11/29/21 0601  NA 143 142 140  K 3.3* 3.5 3.5  CL 104 107 109  CO2 29 29 23   GLUCOSE 141* 82 87  BUN 23 24* 29*  CREATININE 1.93* 2.29* 3.52*  CALCIUM 8.9 9.0 9.2    Liver Function Tests: Recent Labs  Lab 11/26/21 1848 11/27/21 0501  AST 21 15  ALT 13 12  ALKPHOS 94 92  BILITOT 0.5 0.5  PROT 6.0* 5.5*  ALBUMIN 3.2* 2.8*   No results for input(s): "LIPASE", "AMYLASE" in the last 168 hours. No results for input(s): "AMMONIA" in the last 168 hours.  CBC: Recent Labs  Lab 11/26/21 1848 11/27/21 0501 11/27/21 1224 11/27/21 1911 11/28/21 0728 11/28/21 1444 11/29/21 0601  WBC 5.5 5.9  --   --  6.2  --   --   NEUTROABS 3.8  --   --   --   --   --   --   HGB 6.5* 8.3* 8.5* 9.4* 8.8* 9.0* 8.7*  HCT 21.8* 26.2* 27.7* 30.0* 28.1* 29.5* 28.1*  MCV 87.2 86.2  --   --  86.2  --   --   PLT 266 253  --   --  254  --   --     Cardiac Enzymes: No results for input(s): "  CKTOTAL", "CKMB", "CKMBINDEX", "TROPONINI" in the last 168 hours.  BNP: Invalid input(s): "POCBNP"  CBG: No results for input(s): "GLUCAP" in the last 168 hours.  Microbiology: Results for orders placed or performed during the hospital encounter of 07/17/20  Resp Panel by RT-PCR (Flu A&B, Covid) Nasopharyngeal Swab     Status: None   Collection Time: 07/17/20  2:17 PM   Specimen: Nasopharyngeal Swab; Nasopharyngeal(NP) swabs in vial transport medium  Result Value Ref Range Status   SARS Coronavirus 2 by RT PCR NEGATIVE NEGATIVE Final    Comment: (NOTE) SARS-CoV-2 target nucleic acids are NOT DETECTED.  The SARS-CoV-2 RNA is generally detectable in upper respiratory specimens during the acute phase of infection.  The lowest concentration of SARS-CoV-2 viral copies this assay can detect is 138 copies/mL. A negative result does not preclude SARS-Cov-2 infection and should not be used as the sole basis for treatment or other patient management decisions. A negative result may occur with  improper specimen collection/handling, submission of specimen other than nasopharyngeal swab, presence of viral mutation(s) within the areas targeted by this assay, and inadequate number of viral copies(<138 copies/mL). A negative result must be combined with clinical observations, patient history, and epidemiological information. The expected result is Negative.  Fact Sheet for Patients:  EntrepreneurPulse.com.au  Fact Sheet for Healthcare Providers:  IncredibleEmployment.be  This test is no t yet approved or cleared by the [place] FDA and  has been authorized for detection and/or diagnosis of SARS-CoV-2 by FDA under an Emergency Use Authorization (EUA). This EUA will remain  in effect (meaning this test can be used) for the duration of the COVID-19 declaration under Section 564(b)(1) of the Act, 21 U.S.C.section 360bbb-3(b)(1), unless the authorization is terminated  or revoked sooner.       Influenza A by PCR NEGATIVE NEGATIVE Final   Influenza B by PCR NEGATIVE NEGATIVE Final    Comment: (NOTE) The Xpert Xpress SARS-CoV-2/FLU/RSV plus assay is intended as an aid in the diagnosis of influenza from Nasopharyngeal swab specimens and should not be used as a sole basis for treatment. Nasal washings and aspirates are unacceptable for Xpert Xpress SARS-CoV-2/FLU/RSV testing.  Fact Sheet for Patients: EntrepreneurPulse.com.au  Fact Sheet for Healthcare Providers: IncredibleEmployment.be  This test is not yet approved or cleared by the [place] FDA and has been authorized for detection and/or diagnosis of SARS-CoV-2 by FDA  under an Emergency Use Authorization (EUA). This EUA will remain in effect (meaning this test can be used) for the duration of the COVID-19 declaration under Section 564(b)(1) of the Act, 21 U.S.C. section 360bbb-3(b)(1), unless the authorization is terminated or revoked.  Performed at [facility], [address] [place], [place] [address]     Coagulation Studies: No results for input(s): "LABPROT", "INR" in the last 72 hours.  Urinalysis: No results for input(s): "COLORURINE", "LABSPEC", "PHURINE", "GLUCOSEU", "HGBUR", "BILIRUBINUR", "KETONESUR", "PROTEINUR", "UROBILINOGEN", "NITRITE", "LEUKOCYTESUR" in the last 72 hours.  Invalid input(s): "APPERANCEUR"    Imaging: No results found.   Medications:    sodium chloride Stopped (11/26/21 2033)   anticoagulant sodium citrate     pantoprazole 8 mg/hr (11/28/21 2333)    brimonidine  1 drop Both Eyes BID   calcitRIOL  0.25 mcg Oral Q M,W,F   carvedilol  12.5 mg Oral BID   Chlorhexidine Gluconate Cloth  6 each Topical Q0600   vitamin B-12  1,000 mcg Oral Daily   dorzolamide-timolol  1 drop Both Eyes BID   latanoprost  1  drop Both Eyes QHS   loratadine  10 mg Oral Daily   multivitamin  1 tablet Oral Daily   olopatadine  1 drop Both Eyes BID   [START ON 11/30/2021] pantoprazole  40 mg Intravenous Q12H   pentafluoroprop-tetrafluoroeth       sertraline  25 mg Oral Daily   sodium chloride flush  3 mL Intravenous Q12H   torsemide  100 mg Oral Daily   traZODone  150 mg Oral QHS   acetaminophen **OR** acetaminophen, albuterol, alteplase, alum & mag hydroxide-simeth, anticoagulant sodium citrate, heparin, lidocaine (PF), lidocaine-prilocaine, pentafluoroprop-tetrafluoroeth, pentafluoroprop-tetrafluoroeth, polyethylene glycol  Assessment/ Plan:  Ms. ARIANY KESSELMAN is a 88 y.o.  female with past medical history including hyperlipidemia, diabetes, GERD, anemia, diastolic heart failure, hypertension, and end-stage renal  disease on hemodialysis.  Patient presents to the emergency department with abnormal labs.  Patient was found to be anemic and admitted under observation for GI bleed [K92.2] Anemia, unspecified type [D64.9]  [facility]/MWF/LUE AVG  Anemia of chronic kidney disease//acute blood loss: Lab Results  Component Value Date   HGB 8.7 (L) 11/29/2021  Hemoglobin on admission 6.3.  Patient has received 1 unit blood transfusion during this admission.  Receiving IV PPI with GI following.  Recommending EGD however patient hesitant.  Patient was agreeable to proceed with EGD during our visit.  We will continue to monitor hemoglobin during this admission.   End-stage renal disease on hemodialysis.  Will maintain outpatient schedule if possible.  Patient will receive scheduled dialysis treatment today, UF goal 0 to 0.5 L as tolerated.  Next treatment scheduled for Wednesday.  Secondary Hyperparathyroidism:  Lab Results  Component Value Date   PTH 148 (H) 06/07/2019   CALCIUM 9.2 11/29/2021   CAION 1.35 11/13/2019   PHOS 1.7 (L) 07/20/2020  Patient receives calcitriol outpatient.  Calcium remains within desired target.  Phosphorus remains low.  We will continue to monitor.  Hypertension with chronic kidney disease.  Home regimen includes carvedilol and torsemide.  Both currently prescribed during this admission.     LOS: 0 Angelee Bahr 7/31/202312:45 PM

## 2021-11-30 ENCOUNTER — Encounter
Admission: EM | Disposition: A | Discharge status: Home / Self Care | Payer: Self-pay | Source: Home / Self Care | Attending: Internal Medicine

## 2021-11-30 ENCOUNTER — Encounter: Payer: Self-pay | Admitting: Internal Medicine

## 2021-11-30 ENCOUNTER — Inpatient Hospital Stay: Payer: Medicare Other | Admitting: General Practice

## 2021-11-30 DIAGNOSIS — K921 Melena: Secondary | ICD-10-CM | POA: Diagnosis not present

## 2021-11-30 DIAGNOSIS — K31819 Angiodysplasia of stomach and duodenum without bleeding: Secondary | ICD-10-CM | POA: Diagnosis not present

## 2021-11-30 DIAGNOSIS — D62 Acute posthemorrhagic anemia: Secondary | ICD-10-CM

## 2021-11-30 HISTORY — PX: ESOPHAGOGASTRODUODENOSCOPY (EGD) WITH PROPOFOL: SHX5813

## 2021-11-30 LAB — HEMOGLOBIN AND HEMATOCRIT, BLOOD
HCT: 25.7 % — ABNORMAL LOW (ref 36.0–46.0)
Hemoglobin: 8.2 g/dL — ABNORMAL LOW (ref 12.0–15.0)

## 2021-11-30 LAB — HEPATITIS B SURFACE ANTIBODY, QUANTITATIVE: Hep B S AB Quant (Post): <3.1 m[IU]/mL — ABNORMAL LOW (ref >9.9)

## 2021-11-30 SURGERY — ESOPHAGOGASTRODUODENOSCOPY (EGD) WITH PROPOFOL
Anesthesia: General

## 2021-11-30 MED ORDER — LIDOCAINE HCL (CARDIAC) PF 100 MG/5ML IV SOSY
PREFILLED_SYRINGE | INTRAVENOUS | Status: DC | PRN
  Administered 2021-11-30: 100 mg via INTRAVENOUS

## 2021-11-30 MED ORDER — PANTOPRAZOLE SODIUM 40 MG PO TBEC
40.0000 mg | DELAYED_RELEASE_TABLET | Freq: Every day | ORAL | Status: DC
  Administered 2021-11-30: 40 mg via ORAL
  Filled 2021-11-30: qty 1

## 2021-11-30 MED ORDER — SODIUM CHLORIDE 0.9 % IV BOLUS
250.0000 mL | Freq: Once | INTRAVENOUS | Status: DC | PRN
Start: 2021-11-30 — End: 2021-11-30

## 2021-11-30 MED ORDER — OMEPRAZOLE 40 MG PO CPDR
40.0000 mg | DELAYED_RELEASE_CAPSULE | Freq: Every day | ORAL | 2 refills | Status: DC
Start: 2021-11-30 — End: 2023-05-08

## 2021-11-30 MED ORDER — PROPOFOL 10 MG/ML IV BOLUS
INTRAVENOUS | Status: DC | PRN
  Administered 2021-11-30: 20 mg via INTRAVENOUS
  Administered 2021-11-30: 10 mg via INTRAVENOUS
  Administered 2021-11-30: 30 mg via INTRAVENOUS

## 2021-11-30 MED ORDER — SODIUM CHLORIDE 0.9 % IV SOLN
INTRAVENOUS | Status: DC
Start: 2021-11-30 — End: 2021-11-30

## 2021-11-30 NOTE — Progress Notes (Signed)
Physical Therapy Treatment Patient Details Name: Lori Stanley MRN: [id number] DOB: 05/23/1931 Today's Date: 11/30/2021   History of Present Illness Pt is an 88 y/o F admitted on 11/26/21 after presenting to the ED 2/2 finding of low Hgb on oupatient labs. Pt reported single episode of bright red pblood in stool earlier on 7/28, otherwise asymptomatic. Pt was admitted for management of ABLA & transfused 1 unit of pRBCs. PMH: HTN, diastolic heart failure, DM, GERD, glaucoma, HLD, anemia, anxiety, depression, ESRD on HD MWF    PT Comments    Pt very pleasant and eager to work with PT despite not having felt well earlier today.  She was able to ambulate ~100 ft with slow, but safe ambulation.  Pt reports her gait is close to baseline and that she does not typically do much more walking that this anyway.  Her O2 remained in the mid/low 90s much of the time with HR staying the the 80-90s range.      Recommendations for follow up therapy are one component of a multi-disciplinary discharge planning process, led by the attending physician.  Recommendations may be updated based on patient status, additional functional criteria and insurance authorization.  Follow Up Recommendations  Home health PT     Assistance Recommended at Discharge Frequent or constant Supervision/Assistance  Patient can return home with the following Assist for transportation;Help with stairs or ramp for entrance;Assistance with cooking/housework   Equipment Recommendations  None recommended by PT    Recommendations for Other Services       Precautions / Restrictions Precautions Precautions: Fall Restrictions Weight Bearing Restrictions: No     Mobility  Bed Mobility Overal bed mobility: Modified Independent             General bed mobility comments: slow and effortful, but able to get EOB w/o assist    Transfers Overall transfer level: Modified independent Equipment used: Rolling walker (2 wheels)                General transfer comment: able to rise w/o assist, heavy UE use    Ambulation/Gait Ambulation/Gait assistance: Min guard Gait Distance (Feet): 100 Feet Assistive device: Rolling walker (2 wheels)         General Gait Details: Pt showed good effort, appropraite UE/AD use and though she was slow with some guarding reports feeling close to her baseline and that she normally does not walk much more than this anyway.   Stairs             Wheelchair Mobility    Modified Rankin (Stroke Patients Only)       Balance Overall balance assessment: Needs assistance Sitting-balance support: Feet supported Sitting balance-Leahy Scale: Good     Standing balance support: Bilateral upper extremity supported Standing balance-Leahy Scale: Good Standing balance comment: no LOBs during amulation with walker, definite need of UEs                            Cognition Arousal/Alertness: Awake/alert Behavior During Therapy: WFL for tasks assessed/performed Overall Cognitive Status: Within Functional Limits for tasks assessed                                          Exercises      General Comments        Pertinent Vitals/Pain Pain Assessment Pain  Assessment: Faces Faces Pain Scale: Hurts a little bit Pain Location: b/l knee OA, chronic L shld pain    Home Living                          Prior Function            PT Goals (current goals can now be found in the care plan section) Progress towards PT goals: Progressing toward goals    Frequency    Min 2X/week      PT Plan Current plan remains appropriate    Co-evaluation              AM-PAC PT "6 Clicks" Mobility   Outcome Measure  Help needed turning from your back to your side while in a flat bed without using bedrails?: None Help needed moving from lying on your back to sitting on the side of a flat bed without using bedrails?: None Help needed  moving to and from a bed to a chair (including a wheelchair)?: A Little Help needed standing up from a chair using your arms (e.g., wheelchair or bedside chair)?: A Little Help needed to walk in hospital room?: A Little Help needed climbing 3-5 steps with a railing? : A Little 6 Click Score: 20    End of Session Equipment Utilized During Treatment: Gait belt Activity Tolerance: Patient tolerated treatment well Patient left: with call bell/phone within reach;with chair alarm set Nurse Communication: Mobility status PT Visit Diagnosis: Muscle weakness (generalized) (M62.81)     Time: 3428-7681 PT Time Calculation (min) (ACUTE ONLY): 17 min  Charges:  $Gait Training: 8-22 mins                     Kreg Shropshire, DPT 11/30/2021, 5:47 PM

## 2021-11-30 NOTE — Transfer of Care (Signed)
Immediate Anesthesia Transfer of Care Note  Patient: Lori Stanley [facility]  Procedure(s) Performed: ESOPHAGOGASTRODUODENOSCOPY (EGD) WITH PROPOFOL  Patient Location: PACU and Endoscopy Unit  Anesthesia Type:General  Level of Consciousness: awake  Airway & Oxygen Therapy: Patient Spontanous Breathing  Post-op Assessment: Report given to RN  Post vital signs: stable  Last Vitals:  Vitals Value Taken Time  BP    Temp    Pulse    Resp    SpO2      Last Pain:  Vitals:   11/30/21 0742  TempSrc: Temporal  PainSc: 0-No pain         Complications: No notable events documented.

## 2021-11-30 NOTE — Op Note (Signed)
[facility] Patient Name: Lori Stanley Procedure Date: 11/30/2021 8:04 AM MRN: [id number] Account #: [id number] Date of Birth: [birth year] Admit Type: Inpatient Age: 88 Room: [facility] ENDO ROOM 3 Gender: Female Note Status: Finalized Instrument Name: Altamese Cabal Endoscope [id number] Procedure:             Upper GI endoscopy Indications:           Melena Providers:             Lin Landsman MD, MD Referring MD:          Janine Ores. [name], MD (Referring MD) Medicines:             General Anesthesia Complications:         No immediate complications. Estimated blood loss:                         Minimal. Procedure:             Pre-Anesthesia Assessment:                        - Prior to the procedure, a History and Physical was                         performed, and patient medications and allergies were                         reviewed. The patient is competent. The risks and                         benefits of the procedure and the sedation options and                         risks were discussed with the patient. All questions                         were answered and informed consent was obtained.                         Patient identification and proposed procedure were                         verified by the physician, the nurse, the                         anesthesiologist, the anesthetist and the technician                         in the pre-procedure area in the procedure room in the                         endoscopy suite. Mental Status Examination: alert and                         oriented. Airway Examination: normal oropharyngeal                         airway and neck mobility. Respiratory Examination:  clear to auscultation. CV Examination: normal.                         Prophylactic Antibiotics: The patient does not require                         prophylactic antibiotics. Prior Anticoagulants: The                          patient has taken no previous anticoagulant or                         antiplatelet agents. ASA Grade Assessment: III - A                         patient with severe systemic disease. After reviewing                         the risks and benefits, the patient was deemed in                         satisfactory condition to undergo the procedure. The                         anesthesia plan was to use general anesthesia.                         Immediately prior to administration of medications,                         the patient was re-assessed for adequacy to receive                         sedatives. The heart rate, respiratory rate, oxygen                         saturations, blood pressure, adequacy of pulmonary                         ventilation, and response to care were monitored                         throughout the procedure. The physical status of the                         patient was re-assessed after the procedure.                        After obtaining informed consent, the endoscope was                         passed under direct vision. Throughout the procedure,                         the patient's blood pressure, pulse, and oxygen                         saturations were monitored continuously. The Endoscope  was introduced through the mouth, and advanced to the                         second part of duodenum. The upper GI endoscopy was                         accomplished without difficulty. The patient tolerated                         the procedure well. Findings:      A single medium sized polypoid lesion with a localized distribution was       found in the duodenal bulb along the sweep. Biopsies were taken with a       cold forceps for histology.      The second portion of the duodenum was normal.      Striped moderately erythematous mucosa without bleeding was found in the       gastric body and in the gastric antrum. Biopsies were  taken with a cold       forceps for histology.      The cardia and gastric fundus were normal on retroflexion.      A small hiatal hernia was present.      One benign-appearing, intrinsic mild stenosis was found in the lower       third of the esophagus. This stenosis measured less than one cm (in       length). The stenosis was traversed. Impression:            - Nodule found in the duodenum. Biopsied.                        - Normal second portion of the duodenum.                        - Erythematous mucosa in the gastric body and antrum.                         Biopsied.                        - Small hiatal hernia.                        - Benign-appearing esophageal stenosis. Recommendation:        - Await pathology results.                        - Return patient to hospital ward for possible                         discharge same day.                        - Resume previous diet today.                        - Continue present medications.                        - Follow an antireflux regimen indefinitely.                        -  Use Prilosec (omeprazole) 40 mg PO daily                         indefinitely.                        - Return to GI office in 3 months. Procedure Code(s):     --- Professional ---                        [phone], Esophagogastroduodenoscopy, flexible,                         transoral; with biopsy, single or multiple Diagnosis Code(s):     --- Professional ---                        K31.89, Other diseases of stomach and duodenum                        K44.9, Diaphragmatic hernia without obstruction or                         gangrene                        K22.2, Esophageal obstruction                        K92.1, Melena (includes Hematochezia) CPT copyright 2019 American Medical Association. All rights reserved. The codes documented in this report are preliminary and upon coder review may  be revised to meet current compliance requirements. Dr.  Ulyess Mort Lin Landsman MD, MD 11/30/2021 8:29:34 AM This report has been signed electronically. Number of Addenda: 0 Note Initiated On: 11/30/2021 8:04 AM Estimated Blood Loss:  Estimated blood loss was minimal.      [facility]

## 2021-11-30 NOTE — Progress Notes (Signed)
[facility]  ROUNDING NOTE   Subjective:   Lori Stanley is a 88 year old female with past medical history including hyperlipidemia, diabetes, GERD, anemia, diastolic heart failure, hypertension, and end-stage renal disease on hemodialysis.  Patient presents to the emergency department with abnormal labs.  Patient was found to be anemic and admitted under observation for GI bleed [K92.2] Anemia, unspecified type [D64.9] ABLA (acute blood loss anemia) [D62]  Patient is known to our practice and receives outpatient dialysis treatments at [facility] on a Monday Wednesday Friday schedule, supervised by Dr. Candiss Norse.    Patient seen resting in bed after procedure Alert and oriented States procedure went well but unable to remember results No lower extremity edema Awaiting meal tray States possible discharge this afternoon.   Objective:  Vital signs in last 24 hours:  Temp:  [97.5 F (36.4 C)-99.3 F (37.4 C)] 98.1 F (36.7 C) (08/01 0917) Pulse Rate:  [43-77] 43 (08/01 1100) Resp:  [15-30] 18 (08/01 1100) BP: (102-172)/(33-115) 112/42 (08/01 1100) SpO2:  [90 %-98 %] 92 % (08/01 0917) Weight:  [49.2 kg-53.4 kg] 50 kg (08/01 0742)  Weight change: -3.1 kg Filed Weights   11/29/21 1705 11/30/21 0449 11/30/21 0742  Weight: 49.2 kg 53.4 kg 50 kg    Intake/Output: I/O last 3 completed shifts: In: 160 [P.O.:120; I.V.:40] Out: 500 [Other:500]   Intake/Output this shift:  Total I/O In: -  Out: 300 [Urine:300]  Physical Exam: General: NAD, resting comfortably  Head: Normocephalic, atraumatic. Moist oral mucosal membranes  Eyes: Anicteric  Lungs:  Clear to auscultation, normal effort, room air  Heart: Regular rate and rhythm  Abdomen:  Soft, nontender  Extremities: No peripheral edema.  Neurologic: Nonfocal, moving all four extremities  Skin: No lesions  Access: Left aVF    Basic Metabolic Panel: Recent Labs  Lab 11/26/21 1848 11/27/21 0501  11/29/21 0601  NA 143 142 140  K 3.3* 3.5 3.5  CL 104 107 109  CO2 29 29 23   GLUCOSE 141* 82 87  BUN 23 24* 29*  CREATININE 1.93* 2.29* 3.52*  CALCIUM 8.9 9.0 9.2     Liver Function Tests: Recent Labs  Lab 11/26/21 1848 11/27/21 0501  AST 21 15  ALT 13 12  ALKPHOS 94 92  BILITOT 0.5 0.5  PROT 6.0* 5.5*  ALBUMIN 3.2* 2.8*    No results for input(s): "LIPASE", "AMYLASE" in the last 168 hours. No results for input(s): "AMMONIA" in the last 168 hours.  CBC: Recent Labs  Lab 11/26/21 1848 11/27/21 0501 11/27/21 1224 11/27/21 1911 11/28/21 0728 11/28/21 1444 11/29/21 0601 11/30/21 0458  WBC 5.5 5.9  --   --  6.2  --   --   --   NEUTROABS 3.8  --   --   --   --   --   --   --   HGB 6.5* 8.3*   < > 9.4* 8.8* 9.0* 8.7* 8.2*  HCT 21.8* 26.2*   < > 30.0* 28.1* 29.5* 28.1* 25.7*  MCV 87.2 86.2  --   --  86.2  --   --   --   PLT 266 253  --   --  254  --   --   --    < > = values in this interval not displayed.     Cardiac Enzymes: No results for input(s): "CKTOTAL", "CKMB", "CKMBINDEX", "TROPONINI" in the last 168 hours.  BNP: Invalid input(s): "POCBNP"  CBG: No results for input(s): "GLUCAP"  in the last 168 hours.  Microbiology: Results for orders placed or performed during the hospital encounter of 07/17/20  Resp Panel by RT-PCR (Flu A&B, Covid) Nasopharyngeal Swab     Status: None   Collection Time: 07/17/20  2:17 PM   Specimen: Nasopharyngeal Swab; Nasopharyngeal(NP) swabs in vial transport medium  Result Value Ref Range Status   SARS Coronavirus 2 by RT PCR NEGATIVE NEGATIVE Final    Comment: (NOTE) SARS-CoV-2 target nucleic acids are NOT DETECTED.  The SARS-CoV-2 RNA is generally detectable in upper respiratory specimens during the acute phase of infection. The lowest concentration of SARS-CoV-2 viral copies this assay can detect is 138 copies/mL. A negative result does not preclude SARS-Cov-2 infection and should not be used as the sole basis for  treatment or other patient management decisions. A negative result may occur with  improper specimen collection/handling, submission of specimen other than nasopharyngeal swab, presence of viral mutation(s) within the areas targeted by this assay, and inadequate number of viral copies(<138 copies/mL). A negative result must be combined with clinical observations, patient history, and epidemiological information. The expected result is Negative.  Fact Sheet for Patients:  EntrepreneurPulse.com.au  Fact Sheet for Healthcare Providers:  IncredibleEmployment.be  This test is no t yet approved or cleared by the [place] FDA and  has been authorized for detection and/or diagnosis of SARS-CoV-2 by FDA under an Emergency Use Authorization (EUA). This EUA will remain  in effect (meaning this test can be used) for the duration of the COVID-19 declaration under Section 564(b)(1) of the Act, 21 U.S.C.section 360bbb-3(b)(1), unless the authorization is terminated  or revoked sooner.       Influenza A by PCR NEGATIVE NEGATIVE Final   Influenza B by PCR NEGATIVE NEGATIVE Final    Comment: (NOTE) The Xpert Xpress SARS-CoV-2/FLU/RSV plus assay is intended as an aid in the diagnosis of influenza from Nasopharyngeal swab specimens and should not be used as a sole basis for treatment. Nasal washings and aspirates are unacceptable for Xpert Xpress SARS-CoV-2/FLU/RSV testing.  Fact Sheet for Patients: EntrepreneurPulse.com.au  Fact Sheet for Healthcare Providers: IncredibleEmployment.be  This test is not yet approved or cleared by the [place] FDA and has been authorized for detection and/or diagnosis of SARS-CoV-2 by FDA under an Emergency Use Authorization (EUA). This EUA will remain in effect (meaning this test can be used) for the duration of the COVID-19 declaration under Section 564(b)(1) of the Act, 21  U.S.C. section 360bbb-3(b)(1), unless the authorization is terminated or revoked.  Performed at [facility], [address] [place], [place] [address]     Coagulation Studies: No results for input(s): "LABPROT", "INR" in the last 72 hours.  Urinalysis: No results for input(s): "COLORURINE", "LABSPEC", "PHURINE", "GLUCOSEU", "HGBUR", "BILIRUBINUR", "KETONESUR", "PROTEINUR", "UROBILINOGEN", "NITRITE", "LEUKOCYTESUR" in the last 72 hours.  Invalid input(s): "APPERANCEUR"    Imaging: No results found.   Medications:    sodium chloride Stopped (11/26/21 2033)   sodium chloride      brimonidine  1 drop Both Eyes BID   calcitRIOL  0.25 mcg Oral Q M,W,F   carvedilol  12.5 mg Oral BID   Chlorhexidine Gluconate Cloth  6 each Topical Q0600   vitamin B-12  1,000 mcg Oral Daily   dorzolamide-timolol  1 drop Both Eyes BID   latanoprost  1 drop Both Eyes QHS   loratadine  10 mg Oral Daily   multivitamin  1 tablet Oral Daily   olopatadine  1 drop Both Eyes BID  sertraline  25 mg Oral Daily   sodium chloride flush  3 mL Intravenous Q12H   torsemide  100 mg Oral Daily   traZODone  150 mg Oral QHS   acetaminophen **OR** acetaminophen, albuterol, alum & mag hydroxide-simeth, polyethylene glycol, sodium chloride  Assessment/ Plan:  [name] is a 88 y.o.  female with past medical history including hyperlipidemia, diabetes, GERD, anemia, diastolic heart failure, hypertension, and end-stage renal disease on hemodialysis.  Patient presents to the emergency department with abnormal labs.  Patient was found to be anemic and admitted under observation for GI bleed [K92.2] Anemia, unspecified type [D64.9] ABLA (acute blood loss anemia) [D62]  [facility]/MWF/LUE AVG  End-stage renal disease on hemodialysis.  Will maintain outpatient schedule if possible.  Patient received dialysis yesterday, tolerated well.  UF 500 mL achieved.  Next treatment scheduled for  Wednesday.  Anemia of chronic kidney disease//acute blood loss: Lab Results  Component Value Date   HGB 8.2 (L) 11/30/2021  Hemoglobin on admission 6.3.  Patient has received 1 unit blood transfusion during this admission.  GI completed EGD today with findings to include a nodule in the duodenum, erythematous mucosa in gastric body and antrum, and a small hiatal hernia.  Biopsy sent.  No active bleeding noted.  Secondary Hyperparathyroidism: PTH 630 on 11/08/2021 Lab Results  Component Value Date   PTH 148 (H) 06/07/2019   CALCIUM 9.2 11/29/2021   CAION 1.35 11/13/2019   PHOS 1.7 (L) 07/20/2020  Continue calcitriol.  Calcium within acceptable range.  Hypertension with chronic kidney disease.  Home regimen includes carvedilol and torsemide.  Both currently prescribed during this admission.  Blood pressure stable     LOS: 1 Cintya Daughety 8/1/202311:50 AM

## 2021-11-30 NOTE — Progress Notes (Signed)
Patient worked with physical therapy. Up in chair, ambulated with walker. Patient feels much better and improved then earlier today. Patients niece phoned of the news of discharge plans. Patients sister Maryann Conners, will plan to pick patient up at 7pm. Phone number to the 1C desk given along with RN work phone number. Will get patient ready for d/c. PIV removed. AVS reviewed. Tele removed.

## 2021-11-30 NOTE — Progress Notes (Signed)
1100: assisted patient to the bedside commode. Patient HR's started to decrease to the 30-40's. Patient became weak and nauseous. Tele called with findings. Patient had a vagal response while having a BM. X3 assist to get patient back to bed. MD Notified. Continue to monitor.

## 2021-11-30 NOTE — Anesthesia Preprocedure Evaluation (Addendum)
Anesthesia Evaluation  Patient identified by MRN, date of birth, ID band Patient awake    Reviewed: Allergy & Precautions, NPO status , Patient's Chart, lab work & pertinent test results  History of Anesthesia Complications Negative for: history of anesthetic complications  Airway Mallampati: IV  TM Distance: >3 FB Neck ROM: full    Dental  (+) Poor Dentition, Missing   Pulmonary neg pulmonary ROS, neg shortness of breath, neg COPD, former smoker,    Pulmonary exam normal        Cardiovascular hypertension, (-) angina+ Past MI and +CHF  Normal cardiovascular exam     Neuro/Psych PSYCHIATRIC DISORDERS negative neurological ROS     GI/Hepatic negative GI ROS, Neg liver ROS, GERD  ,  Endo/Other  negative endocrine ROSdiabetes  Renal/GU Renal disease  negative genitourinary   Musculoskeletal   Abdominal   Peds  Hematology negative hematology ROS (+) Blood dyscrasia, anemia ,   Anesthesia Other Findings Past Medical History: No date: Acute heart failure ([place]) 06/05/2019: Acute on chronic heart failure with preserved ejection  fraction (HFpEF) (HCC) No date: Anemia 08/27/2019: Anemia due to stage 4 chronic kidney disease ([place]) No date: Anxiety No date: CHF (congestive heart failure) (HCC) No date: Chronic kidney disease     Comment:  stage V; end stage renal disease 01/23/2019: Chronic kidney disease, stage 5 (HCC) No date: Chronic low back pain No date: Depression No date: Diabetes mellitus without complication ([place]) 0/07/4915: Essential hypertension, malignant No date: GERD (gastroesophageal reflux disease) 07/17/2020: Hematoma No date: Hyperlipidemia No date: Hypertension No date: MI (myocardial infarction) ([place]) No date: Obesity 01/23/2019: Proteinuria 04/09/2017: Respiratory failure ([place]) 05/24/2013: SOB (shortness of breath) No date: Unilateral inguinal hernia with obstruction and without   gangrene 08/30/2019: Volume overload  Past Surgical History: 01/15/2020: A/V FISTULAGRAM; Left     Comment:  Procedure: A/V FISTULAGRAM;  Surgeon: Katha Cabal, MD;  Location: [facility];  Service:               Cardiovascular;  Laterality: Left; No date: ABDOMINAL HYSTERECTOMY No date: APPENDECTOMY 11/13/2019: AV FISTULA PLACEMENT; Left     Comment:  Procedure: INSERTION OF ARTERIOVENOUS (AV) GORE-TEX               GRAFT ARM ( BRACHIAL AXILLARY );  Surgeon: Katha Cabal, MD;  Location: [facility];  Service: Vascular;                Laterality: Left; 06/07/2019: DIALYSIS/PERMA CATHETER INSERTION; N/A     Comment:  Procedure: DIALYSIS/PERMA CATHETER INSERTION;  Surgeon:               Katha Cabal, MD;  Location: [facility];               Service: Cardiovascular;  Laterality: N/A; 02/11/2020: DIALYSIS/PERMA CATHETER REMOVAL; N/A     Comment:  Procedure: DIALYSIS/PERMA CATHETER REMOVAL;  Surgeon:               Katha Cabal, MD;  Location: [facility];               Service: Cardiovascular;  Laterality: N/A; No date: EYE SURGERY No date: HERNIA REPAIR No date: INCISION AND DRAINAGE PERIRECTAL ABSCESS 07/12/2020: INGUINAL HERNIA REPAIR; N/A     Comment:  Procedure:  HERNIA REPAIR INGUINAL ADULT;  Surgeon:               Olean Ree, MD;  Location: [facility];  Service:               General;  Laterality: N/A; No date: KNEE SURGERY No date: THYROID SURGERY No date: VAGINAL HYSTERECTOMY  BMI    Body Mass Index: 20.85 kg/m      Reproductive/Obstetrics negative OB ROS                             Anesthesia Physical Anesthesia Plan  ASA: 3  Anesthesia Plan: General   Post-op Pain Management:    Induction: Intravenous  PONV Risk Score and Plan: 3 and Propofol infusion and TIVA  Airway Management Planned: Natural Airway and Nasal Cannula  Additional Equipment:   Intra-op  Plan:   Post-operative Plan:   Informed Consent: I have reviewed the patients History and Physical, chart, labs and discussed the procedure including the risks, benefits and alternatives for the proposed anesthesia with the patient or authorized representative who has indicated his/her understanding and acceptance.     Dental Advisory Given  Plan Discussed with: Anesthesiologist, CRNA and Surgeon  Anesthesia Plan Comments: (Patient consented for risks of anesthesia including but not limited to:  - adverse reactions to medications - risk of airway placement if required - damage to eyes, teeth, lips or other oral mucosa - nerve damage due to positioning  - sore throat or hoarseness - Damage to heart, brain, nerves, lungs, other parts of body or loss of life  Patient voiced understanding.)        Anesthesia Quick Evaluation

## 2021-11-30 NOTE — Discharge Summary (Signed)
Physician Discharge Summary   Patient: Lori Stanley MRN: [id number] DOB: 10-23-1931  Admit date:     11/26/2021  Discharge date: 11/30/2021  Discharge Physician: Ezekiel Slocumb   PCP: Jerrol Banana., MD   Recommendations at discharge:    Follow up with Gastroenterology, Dr. Marius Ditch, in 2-3 months Follow up with Primary Care in 1-2 weeks Repeat CBC in 1-2 weeks, check Hbg Follow up with Nephrology and attend scheduled dialysis  Discharge Diagnoses: Principal Problem:   ABLA (acute blood loss anemia) Active Problems:   GI bleed   Glaucoma   Diabetes ([place])   GERD (gastroesophageal reflux disease)   Hyperlipidemia   Anemia of chronic disease   Anxiety   Clinical depression   Essential hypertension   ESRD (end stage renal disease) (HCC)   Chronic diastolic CHF (congestive heart failure) [facility])   Hospital Course:  Lori Stanley is a 88 y.o. female with medical history significant of hypertension, diastolic heart failure, diabetes, GERD, glaucoma, hyperlipidemia, anemia, anxiety, depression, ESRD on HD MWF presented to the ED on 11/26/2021 due to finding of low hemoglobin on outpatient labs.  Pt reported single episode of bright red blood in stool earlier on 7/28, otherwise asymptomatic.  Later endorsed recent generalized weakness and fatigue.  No prior history of GI bleeding per patient.   In the ED, Hbg was 6.5.  Hemodynamically stable.  FOBT was positive.  Pt was typed and screened, transfused 1 unit pRBC's. Admitted to the hospital for further evaluation and management of GI bleeding with acute blood loss anemia.  8/1: Patient underwent EGD this AM with Dr. 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  GI recommended to continue omeprazole 40 daily indefinitely and will see patient in follow up in 3 months.   Patient had no further melena or other signs of bleeding and hemoglobin remained stable.       Assessment and Plan: * ABLA (acute blood loss anemia) Superimposed on anemia of chronic disease. Hbg 6.5 on admission.  Due to GI bleeding. FOBT positive and pt reported red blood in stool.   Transfused 1 unit pRBC's on admission. Hbg trend: 6.5 >> 8.3 >> 8.5 >> 9.4 >> 8.8 >> 9.0 --GI consulted --EGD planned for tomrrow --Trend Hbg, transfuse if < 7.0 or worsening hemodynamics with active bleeding -- Diet per GI, currently on full liquids, NPO after 5 AM tomorrow --Continue IV PPI   GI bleed See ABLA         Consultants: GI Procedures performed: EGD, dialysis  Disposition: Home Diet recommendation:  Discharge Diet Orders (From admission, onward)     Start     Ordered   11/30/21 0000  Diet - low sodium heart healthy        11/30/21 0918           Renal diet DISCHARGE MEDICATION: Allergies as of 11/30/2021       Reactions   Antihistamines, Chlorpheniramine-type Other (See Comments)   Unknown reaction.   Paxil [paroxetine]    "makes her feel funny" not in a good way   Codeine Rash, Other (See Comments)   Mouth sore        Medication List     TAKE these medications    acetaminophen 500 MG tablet Commonly known as: TYLENOL Take 500-1,000 mg by mouth every 6 (six) hours as needed for mild pain or fever.   albuterol 108 (90 Base) MCG/ACT inhaler Commonly  known as: VENTOLIN HFA TAKE 2 PUFFS BY MOUTH EVERY 6 HOURS AS NEEDED FOR WHEEZE OR SHORTNESS OF BREATH What changed: See the new instructions.   Bepotastine Besilate 1.5 % Soln Place 1 drop into both eyes in the morning and at bedtime. Morning & afternoon.   brimonidine 0.2 % ophthalmic solution Commonly known as: ALPHAGAN Place 1 drop into both eyes 2 (two) times daily.   calcitRIOL 0.25 MCG capsule Commonly known as: ROCALTROL Take 0.25 mcg by mouth every Monday, Wednesday, and Friday.   carvedilol 3.125 MG tablet Commonly known as: COREG Take 3.125 mg by mouth 2 (two) times daily.    dorzolamide-timolol 22.3-6.8 MG/ML ophthalmic solution Commonly known as: COSOPT Place 1 drop into both eyes 2 (two) times daily.   fluticasone 50 MCG/ACT nasal spray Commonly known as: FLONASE Place 1 spray into both nostrils daily.   latanoprost 0.005 % ophthalmic solution Commonly known as: XALATAN Place 1 drop into both eyes at bedtime.   loratadine 10 MG tablet Commonly known as: CLARITIN TAKE 1 TABLET BY MOUTH EVERY DAY   multivitamin Tabs tablet Take 1 tablet by mouth daily.   omeprazole 40 MG capsule Commonly known as: PRILOSEC Take 1 capsule (40 mg total) by mouth daily.   OXYGEN Inhale 2 L into the lungs at bedtime.   sertraline 25 MG tablet Commonly known as: ZOLOFT TAKE 1 TABLET BY MOUTH EVERY DAY   torsemide 100 MG tablet Commonly known as: DEMADEX Take 1 tablet (100 mg total) by mouth daily.   traZODone 150 MG tablet Commonly known as: DESYREL TAKE 1 TABLET BY MOUTH AT BEDTIME.        Discharge Exam: Filed Weights   11/29/21 1705 11/30/21 0449 11/30/21 0742  Weight: 49.2 kg 53.4 kg 50 kg   General exam: awake, alert, no acute distress HEENT: atraumatic, clear conjunctiva, anicteric sclera, moist mucus membranes, hearing grossly normal  Respiratory system: CTAB, no wheezes, rales or rhonchi, normal respiratory effort. Cardiovascular system: normal S1/S2, RRR, no JVD, murmurs, rubs, gallops, no pedal edema.   Gastrointestinal system: soft, NT, ND, no HSM felt, +bowel sounds. Central nervous system: A&O x4. no gross focal neurologic deficits, normal speech Extremities: moves all, no edema, normal tone Skin: dry, intact, normal temperature Psychiatry: normal mood, congruent affect, judgement and insight appear normal   Condition at discharge: stable  The results of significant diagnostics from this hospitalization (including imaging, microbiology, ancillary and laboratory) are listed below for reference.   Imaging Studies: No results  found.  Microbiology: Results for orders placed or performed during the hospital encounter of 07/17/20  Resp Panel by RT-PCR (Flu A&B, Covid) Nasopharyngeal Swab     Status: None   Collection Time: 07/17/20  2:17 PM   Specimen: Nasopharyngeal Swab; Nasopharyngeal(NP) swabs in vial transport medium  Result Value Ref Range Status   SARS Coronavirus 2 by RT PCR NEGATIVE NEGATIVE Final    Comment: (NOTE) SARS-CoV-2 target nucleic acids are NOT DETECTED.  The SARS-CoV-2 RNA is generally detectable in upper respiratory specimens during the acute phase of infection. The lowest concentration of SARS-CoV-2 viral copies this assay can detect is 138 copies/mL. A negative result does not preclude SARS-Cov-2 infection and should not be used as the sole basis for treatment or other patient management decisions. A negative result may occur with  improper specimen collection/handling, submission of specimen other than nasopharyngeal swab, presence of viral mutation(s) within the areas targeted by this assay, and inadequate number of viral copies(<138 copies/mL). A  negative result must be combined with clinical observations, patient history, and epidemiological information. The expected result is Negative.  Fact Sheet for Patients:  EntrepreneurPulse.com.au  Fact Sheet for Healthcare Providers:  IncredibleEmployment.be  This test is no t yet approved or cleared by the [place] FDA and  has been authorized for detection and/or diagnosis of SARS-CoV-2 by FDA under an Emergency Use Authorization (EUA). This EUA will remain  in effect (meaning this test can be used) for the duration of the COVID-19 declaration under Section 564(b)(1) of the Act, 21 U.S.C.section 360bbb-3(b)(1), unless the authorization is terminated  or revoked sooner.       Influenza A by PCR NEGATIVE NEGATIVE Final   Influenza B by PCR NEGATIVE NEGATIVE Final    Comment: (NOTE) The  Xpert Xpress SARS-CoV-2/FLU/RSV plus assay is intended as an aid in the diagnosis of influenza from Nasopharyngeal swab specimens and should not be used as a sole basis for treatment. Nasal washings and aspirates are unacceptable for Xpert Xpress SARS-CoV-2/FLU/RSV testing.  Fact Sheet for Patients: EntrepreneurPulse.com.au  Fact Sheet for Healthcare Providers: IncredibleEmployment.be  This test is not yet approved or cleared by the [place] FDA and has been authorized for detection and/or diagnosis of SARS-CoV-2 by FDA under an Emergency Use Authorization (EUA). This EUA will remain in effect (meaning this test can be used) for the duration of the COVID-19 declaration under Section 564(b)(1) of the Act, 21 U.S.C. section 360bbb-3(b)(1), unless the authorization is terminated or revoked.  Performed at [facility], [address] [place], [place] [address]     Labs: CBC: Recent Labs  Lab 11/26/21 1848 11/27/21 0501 11/27/21 1224 11/27/21 1911 11/28/21 0728 11/28/21 1444 11/29/21 0601 11/30/21 0458  WBC 5.5 5.9  --   --  6.2  --   --   --   NEUTROABS 3.8  --   --   --   --   --   --   --   HGB 6.5* 8.3*   < > 9.4* 8.8* 9.0* 8.7* 8.2*  HCT 21.8* 26.2*   < > 30.0* 28.1* 29.5* 28.1* 25.7*  MCV 87.2 86.2  --   --  86.2  --   --   --   PLT 266 253  --   --  254  --   --   --    < > = values in this interval not displayed.   Basic Metabolic Panel: Recent Labs  Lab 11/26/21 1848 11/27/21 0501 11/29/21 0601  NA 143 142 140  K 3.3* 3.5 3.5  CL 104 107 109  CO2 29 29 23   GLUCOSE 141* 82 87  BUN 23 24* 29*  CREATININE 1.93* 2.29* 3.52*  CALCIUM 8.9 9.0 9.2   Liver Function Tests: Recent Labs  Lab 11/26/21 1848 11/27/21 0501  AST 21 15  ALT 13 12  ALKPHOS 94 92  BILITOT 0.5 0.5  PROT 6.0* 5.5*  ALBUMIN 3.2* 2.8*   CBG: No results for input(s): "GLUCAP" in the last 168 hours.  Discharge time spent: greater  than 30 minutes.  Signed: Ezekiel Slocumb, DO [facility] 11/30/2021

## 2021-11-30 NOTE — Anesthesia Postprocedure Evaluation (Signed)
Anesthesia Post Note  Patient: Araiyah Cumpton [facility]  Procedure(s) Performed: ESOPHAGOGASTRODUODENOSCOPY (EGD) WITH PROPOFOL  Patient location during evaluation: Endoscopy Anesthesia Type: General Level of consciousness: awake and alert Pain management: pain level controlled Vital Signs Assessment: post-procedure vital signs reviewed and stable Respiratory status: spontaneous breathing, nonlabored ventilation, respiratory function stable and patient connected to nasal cannula oxygen Cardiovascular status: blood pressure returned to baseline and stable Postop Assessment: no apparent nausea or vomiting Anesthetic complications: no   No notable events documented.   Last Vitals:  Vitals:   11/30/21 0744 11/30/21 0831  BP: (!) 136/115 (!) 102/33  Pulse:    Resp:    Temp:  (!) 36.4 C  SpO2: 90% 94%    Last Pain:  Vitals:   11/30/21 0831  TempSrc: Temporal  PainSc: 0-No pain                 Dimas Millin

## 2021-12-01 ENCOUNTER — Other Ambulatory Visit: Payer: Self-pay

## 2021-12-01 ENCOUNTER — Encounter: Payer: Self-pay | Admitting: Gastroenterology

## 2021-12-01 DIAGNOSIS — Z992 Dependence on renal dialysis: Secondary | ICD-10-CM | POA: Diagnosis not present

## 2021-12-01 DIAGNOSIS — N186 End stage renal disease: Secondary | ICD-10-CM | POA: Diagnosis not present

## 2021-12-01 LAB — SURGICAL PATHOLOGY

## 2021-12-01 NOTE — Patient Outreach (Signed)
  Care Coordination [facility] Note Transition Care Management Unsuccessful Follow-up Telephone Call  Date of discharge and from where:  11/30/21 [facility]  Attempts:  1st Attempt  Reason for unsuccessful TCM follow-up call:  Left voice message  Peter Garter RN, [facility], [place] Management [phone]

## 2021-12-02 ENCOUNTER — Telehealth: Payer: Self-pay

## 2021-12-02 ENCOUNTER — Ambulatory Visit: Payer: Self-pay | Admitting: *Deleted

## 2021-12-02 NOTE — Telephone Encounter (Signed)
Transition Care Management Unsuccessful Follow-up Telephone Call  Date of discharge and from where:  [facility] 8/1  Attempts:  2nd Attempt  Reason for unsuccessful TCM follow-up call:  Left voice message

## 2021-12-02 NOTE — Patient Outreach (Signed)
  Care Coordination [facility] Note Transition Care Management Unsuccessful Follow-up Telephone Call  Date of discharge and from where:  11/30/2021 [place]  Attempts:  2nd Attempt  Reason for unsuccessful TCM follow-up call:  Left voice message  Hubert Azure RN, MSN RN Care Management Coordinator  [place] [phone] [email or web address]

## 2021-12-03 ENCOUNTER — Ambulatory Visit: Payer: Self-pay | Admitting: *Deleted

## 2021-12-03 DIAGNOSIS — N186 End stage renal disease: Secondary | ICD-10-CM | POA: Diagnosis not present

## 2021-12-03 DIAGNOSIS — Z992 Dependence on renal dialysis: Secondary | ICD-10-CM | POA: Diagnosis not present

## 2021-12-03 NOTE — Patient Outreach (Signed)
  Care Coordination [facility] Note Transition Care Management Unsuccessful Follow-up Telephone Call  Date of discharge and from where:  11/30/2021 [place]  Attempts:  3rd Attempt  Reason for unsuccessful TCM follow-up call:  Left voice message  Hubert Azure RN, MSN RN Care Management Coordinator  [place] [phone] [email or web address]

## 2021-12-05 IMAGING — CT CT ABD-PELV W/O CM
2 of 4 series · 16 of 46 positions shown, 18 images · non-contrast
Comparison: 07/11/2020

CLINICAL DATA: Abdominal pain, hernia surgery last week, bleeding
around JP drain

EXAM:
CT ABDOMEN AND PELVIS WITHOUT CONTRAST
TECHNIQUE: Multidetector CT imaging of the abdomen and pelvis was performed
following the standard protocol without IV contrast.

[Series 2: axial st · axial · 0.71mm/px · z∈[-1006,-606]mm · 13 of 88 slices shown, 15 images]
[im 4/88  soft-tissue]
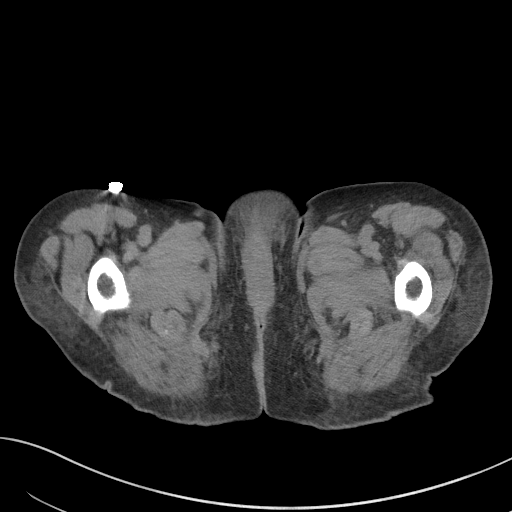
[im 4/88  bone]
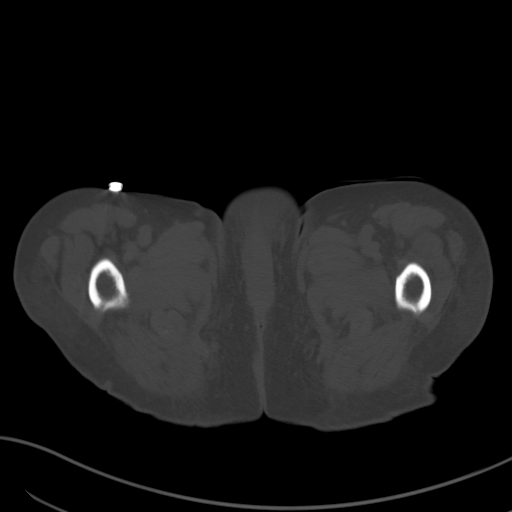
[im 12/88  soft-tissue]
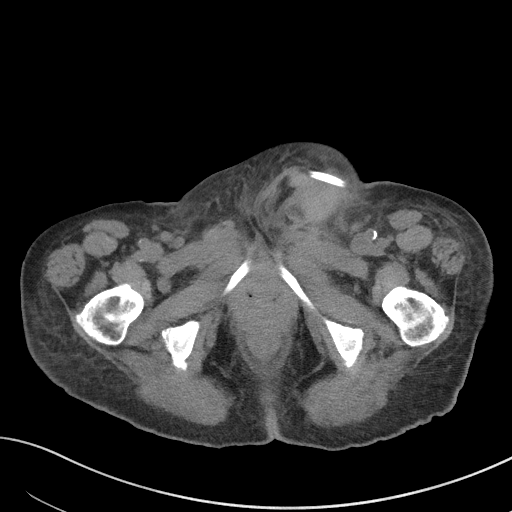
[im 19/88  soft-tissue]
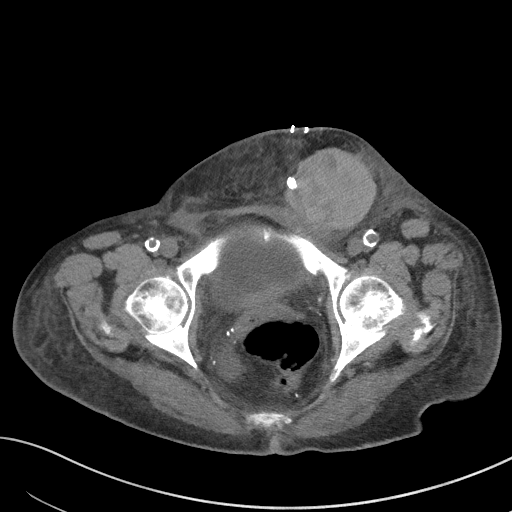
[im 23/88  soft-tissue]
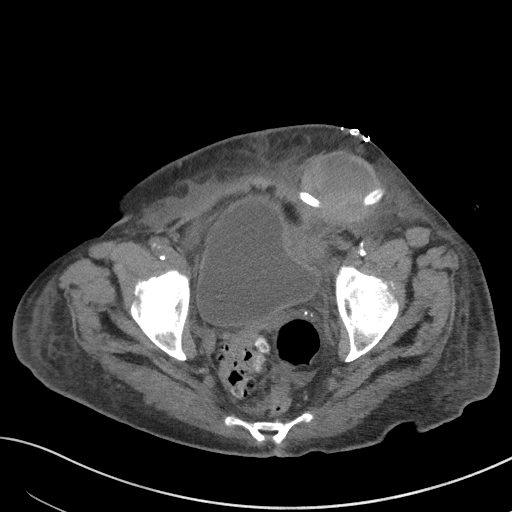
[im 31/88  soft-tissue]
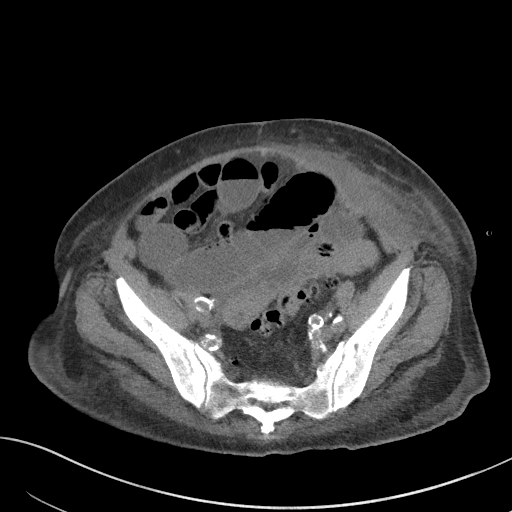
[im 38/88  soft-tissue]
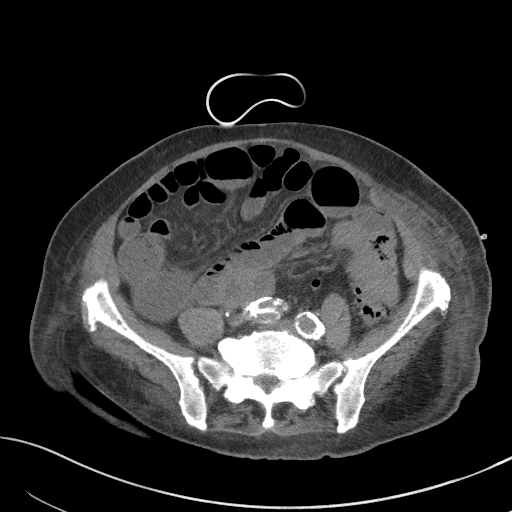
[im 46/88  soft-tissue]
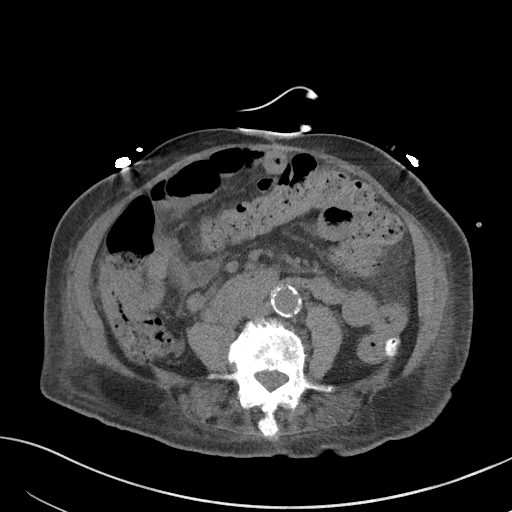
[im 50/88  soft-tissue]
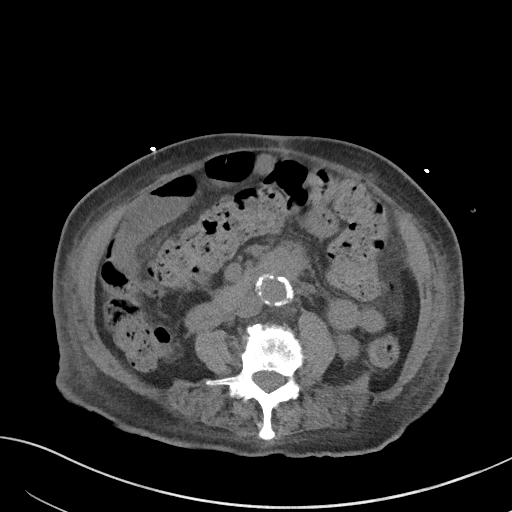
[im 57/88  soft-tissue]
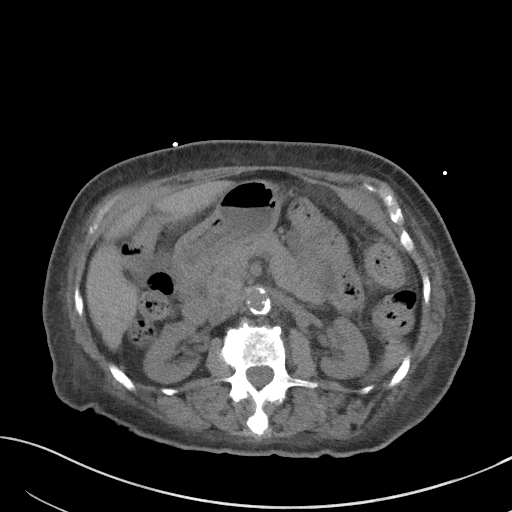
[im 57/88  bone]
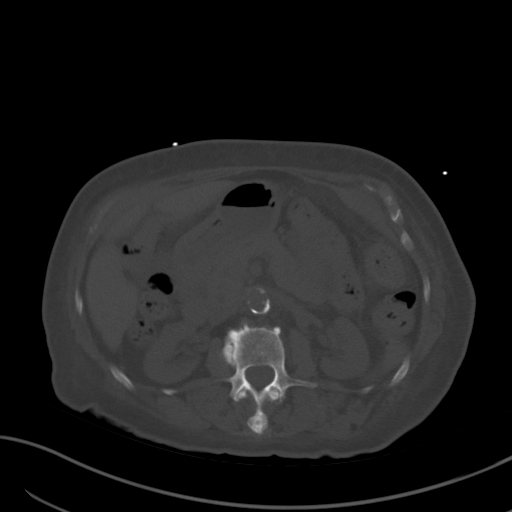
[im 65/88  soft-tissue]
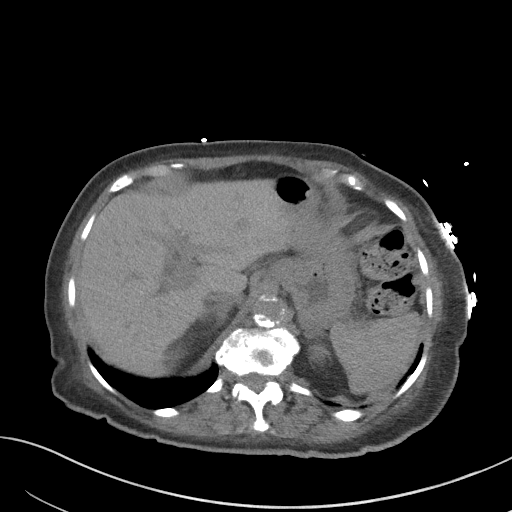
[im 69/88  soft-tissue]
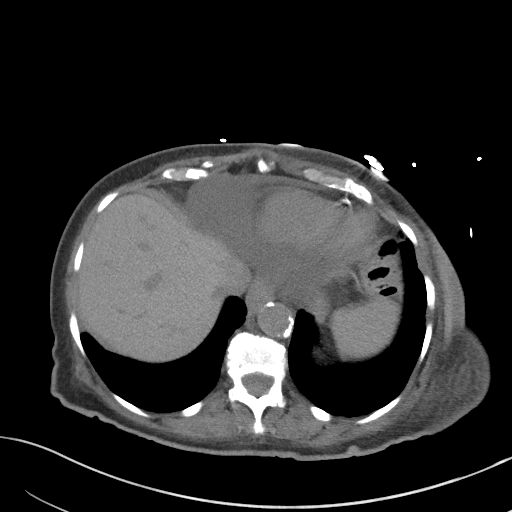
[im 76/88  soft-tissue]
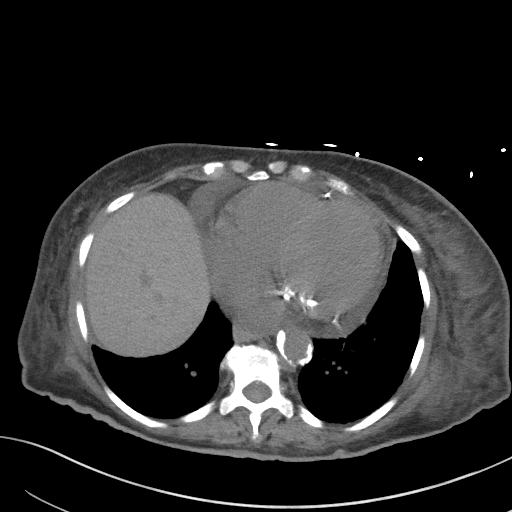
[im 84/88  soft-tissue]
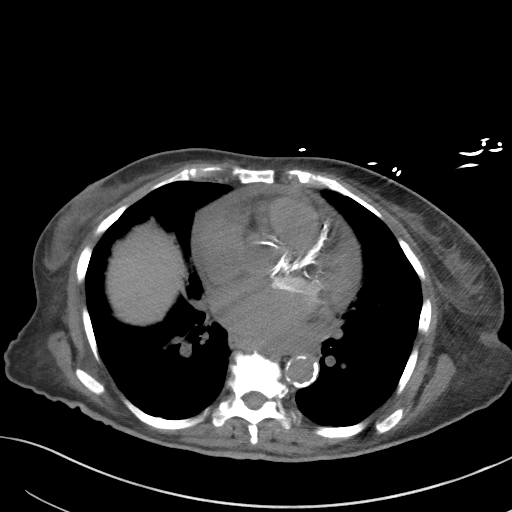

[Series 5: coronal st · coronal · 0.78mm/px · 3 of 82 slices shown]
[im 28/82  soft-tissue]
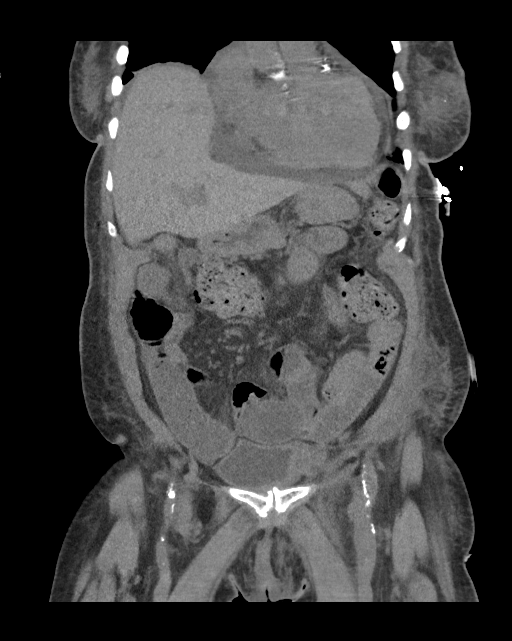
[im 37/82  soft-tissue]
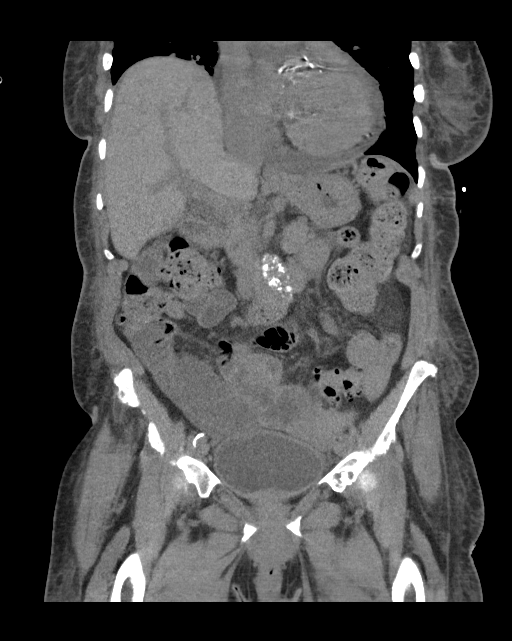
[im 46/82  soft-tissue]
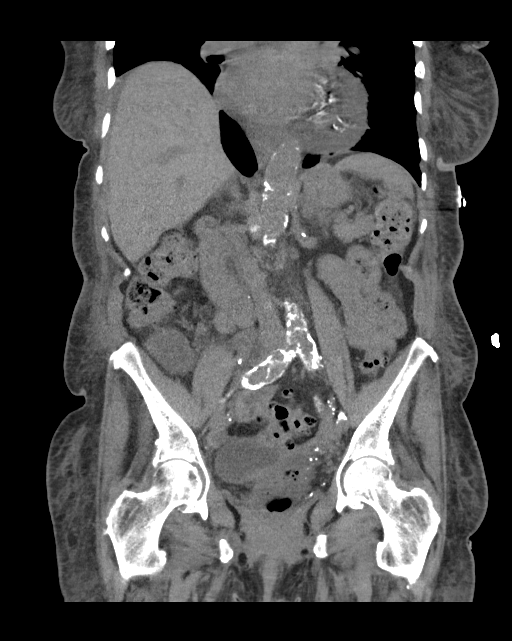

[16 of 46 positions shown; findings below may reference images not displayed]

FINDINGS: Lower chest: Cardiomegaly. Unchanged small pericardial effusion.
Three-vessel coronary artery calcifications.

Hepatobiliary: No solid liver abnormality is seen. Gallstones in the
contracted gallbladder. No gallbladder wall thickening, or biliary
dilatation.

Pancreas: Unremarkable. No pancreatic ductal dilatation or
surrounding inflammatory changes.

Spleen: Unchanged fluid attenuation splenic cyst or hemangioma
(series 2, image 28).

Adrenals/Urinary Tract: Adrenal glands are unremarkable. Kidneys are
normal, without renal calculi, solid lesion, or hydronephrosis.
Bladder is unremarkable.

Stomach/Bowel: Stomach is within normal limits. Appendix appears
normal. There are fluid-filled, nonobstructed loops of bowel in the
abdomen. Gas is present to the rectum. Sigmoid diverticulosis.
Moderate burden of stool.

Vascular/Lymphatic: Aortic atherosclerosis. No enlarged abdominal or
pelvic lymph nodes.

Reproductive: Status post hysterectomy.

Other: Interval evidence of left inguinal hernia repair. There is a
hematoma within the abdominal wall at this site measuring 6.6 x
cm (series 2, image 91). Surgical drain is positioned about this
hematoma. No abdominopelvic ascites.

Musculoskeletal: No acute or significant osseous findings.
IMPRESSION: 1. Interval evidence of left inguinal hernia repair. There is a
hematoma within the abdominal wall at this site measuring 6.6 x
cm. Surgical drain is positioned about this hematoma.
2. No evidence of persistent or recurrent bowel obstruction.
3. Sigmoid diverticulosis.
4. Cholelithiasis.
5. Cardiomegaly and coronary artery disease.

Aortic Atherosclerosis ([id number]-[id number].A).

## 2021-12-06 DIAGNOSIS — Z992 Dependence on renal dialysis: Secondary | ICD-10-CM | POA: Diagnosis not present

## 2021-12-06 DIAGNOSIS — N186 End stage renal disease: Secondary | ICD-10-CM | POA: Diagnosis not present

## 2021-12-07 ENCOUNTER — Ambulatory Visit (INDEPENDENT_AMBULATORY_CARE_PROVIDER_SITE_OTHER): Payer: Medicare Other | Admitting: Medical

## 2021-12-07 ENCOUNTER — Encounter: Payer: Self-pay | Admitting: Medical

## 2021-12-07 VITALS — BP 140/50 | HR 80 | Ht 63.0 in | Wt 112.0 lb

## 2021-12-07 DIAGNOSIS — I10 Essential (primary) hypertension: Secondary | ICD-10-CM

## 2021-12-07 DIAGNOSIS — N186 End stage renal disease: Secondary | ICD-10-CM | POA: Diagnosis not present

## 2021-12-07 DIAGNOSIS — I5032 Chronic diastolic (congestive) heart failure: Secondary | ICD-10-CM

## 2021-12-07 DIAGNOSIS — I350 Nonrheumatic aortic (valve) stenosis: Secondary | ICD-10-CM | POA: Diagnosis not present

## 2021-12-07 DIAGNOSIS — I359 Nonrheumatic aortic valve disorder, unspecified: Secondary | ICD-10-CM

## 2021-12-07 NOTE — Addendum Note (Signed)
Addended by: Anselm Pancoast on: 12/07/2021 10:40 AM   Modules accepted: Orders

## 2021-12-07 NOTE — Patient Instructions (Signed)
Medication Instructions:   Your physician recommends that you continue on your current medications as directed. Please refer to the Current Medication list given to you today.   *If you need a refill on your cardiac medications before your next appointment, please call your pharmacy*   Lab Work: None ordered  If you have labs (blood work) drawn today and your tests are completely normal, you will receive your results only by: [place] (if you have MyChart) OR A paper copy in the mail If you have any lab test that is abnormal or we need to change your treatment, we will call you to review the results.   Testing/Procedures: Your physician has requested that you have an echocardiogram. Echocardiography is a painless test that uses sound waves to create images of your heart. It provides your doctor with information about the size and shape of your heart and how well your heart's chambers and valves are working. This procedure takes approximately one hour. There are no restrictions for this procedure.    Follow-Up: At [facility], you and your health needs are our priority.  As part of our continuing mission to provide you with exceptional heart care, we have created designated Provider Care Teams.  These Care Teams include your primary Cardiologist (physician) and Advanced Practice Providers (APPs -  Physician Assistants and Nurse Practitioners) who all work together to provide you with the care you need, when you need it.  We recommend signing up for the patient portal called "MyChart".  Sign up information is provided on this After Visit Summary.  MyChart is used to connect with patients for Virtual Visits (Telemedicine).  Patients are able to view lab/test results, encounter notes, upcoming appointments, etc.  Non-urgent messages can be sent to your provider as well.   To learn more about what you can do with MyChart, go to [email or web address].    Your next appointment:   6  month(s)  The format for your next appointment:   In Person  Provider:   You may see Kathlyn Sacramento, MD or one of the following Advanced Practice Providers on your designated Care Team:   Murray Hodgkins, NP Christell Faith, PA-C Cadence Kathlen Mody, [place]    Other Instructions N/A  Important Information About [place]

## 2021-12-07 NOTE — Progress Notes (Signed)
Cardiology Office Note:    Date:  12/07/2021   ID:  Lori Stanley, Lori Stanley [birth year], MRN [id number]  PCP:  Jerrol Banana., MD  [facility] Cardiologist:  Kathlyn Sacramento, MD  [facility] Electrophysiologist:  None   Referring MD: Jerrol Banana.,*   Chief Complaint: 6 month follow-up/hospital follow-up  History of Present Illness:    Lori Stanley is a 88 y.o. female with a hx of chronic systolic diastolic heart failure, HTN, diabetes, ESRD on HD twice weekly who presents for 6 month follow-up.   Nuclear stress test in January 2015 showed no evidence of ischemia with normal EF. Echo in February 2021 showed normal LVSF with mild AS and mild pulmonary HTN.   Last seen 01/2020 and was overall doing well.   Patient was hospitalized for anemia with down to Hgb 6.5, requiring transfusion. EGD showed no source of bleeding. She was started on omeprazole. D/c Hgb was 8.8.   Today, the patient reports she is doing better since being home. She is overall feeling better with a little more energy. She will follow-up with GI. She does Hemodialysis M,W,F. She denies chest pain. She has some DOE. She has some LLE, this is dependent. No orthopnea or pnd. No lightheadedness or dizziness. BP is higher, but she didn't have her mediations. She still makes urine and takes torsemide 100mg  daily.   Past Medical History:  Diagnosis Date   Acute heart failure (HCC)    Acute on chronic heart failure with preserved ejection fraction (HFpEF) ([place]) 06/05/2019   Anemia    Anemia due to stage 4 chronic kidney disease ([place]) 08/27/2019   Anxiety    CHF (congestive heart failure) (HCC)    Chronic kidney disease    stage V; end stage renal disease   Chronic kidney disease, stage 5 ([place]) 01/23/2019   Chronic low back pain    Depression    Diabetes mellitus without complication ([place])    Essential hypertension, malignant 05/24/2013   GERD (gastroesophageal reflux disease)    Hematoma 07/17/2020    Hyperlipidemia    Hypertension    MI (myocardial infarction) ([place])    Obesity    Proteinuria 01/23/2019   Respiratory failure ([place]) 04/09/2017   SOB (shortness of breath) 05/24/2013   Unilateral inguinal hernia with obstruction and without gangrene    Volume overload 08/30/2019    Past Surgical History:  Procedure Laterality Date   A/V FISTULAGRAM Left 01/15/2020   Procedure: A/V FISTULAGRAM;  Surgeon: Katha Cabal, MD;  Location: [facility];  Service: Cardiovascular;  Laterality: Left;   ABDOMINAL HYSTERECTOMY     APPENDECTOMY     AV FISTULA PLACEMENT Left 11/13/2019   Procedure: INSERTION OF ARTERIOVENOUS (AV) GORE-TEX GRAFT ARM ( BRACHIAL AXILLARY );  Surgeon: Katha Cabal, MD;  Location: [facility] ORS;  Service: Vascular;  Laterality: Left;   DIALYSIS/PERMA CATHETER INSERTION N/A 06/07/2019   Procedure: DIALYSIS/PERMA CATHETER INSERTION;  Surgeon: Katha Cabal, MD;  Location: [facility];  Service: Cardiovascular;  Laterality: N/A;   DIALYSIS/PERMA CATHETER REMOVAL N/A 02/11/2020   Procedure: DIALYSIS/PERMA CATHETER REMOVAL;  Surgeon: Katha Cabal, MD;  Location: [facility];  Service: Cardiovascular;  Laterality: N/A;   ESOPHAGOGASTRODUODENOSCOPY (EGD) WITH PROPOFOL N/A 11/30/2021   Procedure: ESOPHAGOGASTRODUODENOSCOPY (EGD) WITH PROPOFOL;  Surgeon: Lin Landsman, MD;  Location: [facility] ENDOSCOPY;  Service: Gastroenterology;  Laterality: N/A;   EYE SURGERY     HERNIA REPAIR  INCISION AND DRAINAGE PERIRECTAL ABSCESS     INGUINAL HERNIA REPAIR N/A 07/12/2020   Procedure: HERNIA REPAIR INGUINAL ADULT;  Surgeon: Olean Ree, MD;  Location: [facility] ORS;  Service: General;  Laterality: N/A;   KNEE SURGERY     THYROID SURGERY     VAGINAL HYSTERECTOMY      Current Medications: Current Meds  Medication Sig   acetaminophen (TYLENOL) 500 MG tablet Take 500-1,000 mg by mouth every 6 (six) hours as needed for mild pain or fever.    albuterol  (VENTOLIN HFA) 108 (90 Base) MCG/ACT inhaler TAKE 2 PUFFS BY MOUTH EVERY 6 HOURS AS NEEDED FOR WHEEZE OR SHORTNESS OF BREATH (Patient taking differently: Inhale 2 puffs into the lungs every 6 (six) hours as needed for wheezing or shortness of breath.)   Bepotastine Besilate 1.5 % SOLN Place 1 drop into both eyes in the morning and at bedtime. Morning & afternoon.   brimonidine (ALPHAGAN) 0.2 % ophthalmic solution Place 1 drop into both eyes 2 (two) times daily.    calcitRIOL (ROCALTROL) 0.25 MCG capsule Take 0.25 mcg by mouth every Monday, Wednesday, and Friday.    carvedilol (COREG) 3.125 MG tablet Take 3.125 mg by mouth 2 (two) times daily.   dorzolamide-timolol (COSOPT) 22.3-6.8 MG/ML ophthalmic solution Place 1 drop into both eyes 2 (two) times daily.    fluticasone (FLONASE) 50 MCG/ACT nasal spray Place 1 spray into both nostrils daily.   latanoprost (XALATAN) 0.005 % ophthalmic solution Place 1 drop into both eyes at bedtime.   loratadine (CLARITIN) 10 MG tablet TAKE 1 TABLET BY MOUTH EVERY DAY   multivitamin (RENA-VIT) TABS tablet Take 1 tablet by mouth daily.    omeprazole (PRILOSEC) 40 MG capsule Take 1 capsule (40 mg total) by mouth daily.   OXYGEN Inhale 2 L into the lungs at bedtime.   sertraline (ZOLOFT) 25 MG tablet TAKE 1 TABLET BY MOUTH EVERY DAY   torsemide (DEMADEX) 100 MG tablet Take 1 tablet (100 mg total) by mouth daily.   traZODone (DESYREL) 150 MG tablet TAKE 1 TABLET BY MOUTH AT BEDTIME.     Allergies:   Antihistamines, chlorpheniramine-type; Paxil [paroxetine]; and Codeine   Social History   Socioeconomic History   Marital status: Widowed    Spouse name: Not on file   Number of children: 1   Years of education: Not on file   Highest education level: 11th grade  Occupational History   Occupation: retired  Tobacco Use   Smoking status: Former    Types: Cigarettes   Smokeless tobacco: Never   Tobacco comments:    Quit in 2015-2016  Vaping Use   Vaping Use:  Never used  Substance and Sexual Activity   Alcohol use: No   Drug use: No   Sexual activity: Never  Other Topics Concern   Not on file  Social History Narrative   Lives in [place]; self; friends do chores; quit smoking many years; no alcohol. Worked in [employer].    Social Determinants of Health   Financial Resource Strain: Low Risk  (05/27/2020)   Overall Financial Resource Strain (CARDIA)    Difficulty of Paying Living Expenses: Not hard at all  Food Insecurity: No Food Insecurity (05/27/2020)   Hunger Vital Sign    Worried About Running Out of Food in the Last Year: Never true    Ran Out of Food in the Last Year: Never true  Transportation Needs: No Transportation Needs (05/27/2020)   PRAPARE - Transportation  Lack of Transportation (Medical): No    Lack of Transportation (Non-Medical): No  Physical Activity: Inactive (05/27/2020)   Exercise Vital Sign    Days of Exercise per Week: 0 days    Minutes of Exercise per Session: 0 min  Stress: No Stress Concern Present (05/27/2020)   [place]    Feeling of Stress : Not at all  Social Connections: Socially Isolated (05/27/2020)   Social Connection and Isolation Panel [NHANES]    Frequency of Communication with Friends and Family: More than three times a week    Frequency of Social Gatherings with Friends and Family: Three times a week    Attends Religious Services: Never    Active Member of Clubs or Organizations: No    Attends Archivist Meetings: Never    Marital Status: Widowed     Family History: The patient's family history includes Cancer in her sister; Heart attack in her mother; Heart disease in her sister.  ROS:   Please see the history of present illness.     All other systems reviewed and are negative.  EKGs/Labs/Other Studies Reviewed:    The following studies were reviewed today:  Echo 06/2019   1. Left ventricular ejection  fraction, by visual estimation, is 60 to  65%. The left ventricle has normal function. There is moderately increased  left ventricular hypertrophy.   2. The left ventricle has no regional wall motion abnormalities.   3. Left ventricular diastolic parameters are consistent with Grade II  diastolic dysfunction (pseudonormalization).   4. Global right ventricle has normal systolic function.The right  ventricular size is normal. No increase in right ventricular wall  thickness.   5. Left atrial size was severely dilated.   6. Mildly elevated pulmonary artery systolic pressure.   7. The inferior vena cava is dilated in size with <50% respiratory  variability, suggesting right atrial pressure of 15 mmHg.   8. pleural effusion noted on left, 7 cm   9. Small pericardial effusion.   EKG:  EKG is ordered today.  The ekg ordered today demonstrates NSR 80bpm, LAD, nonspecific T wave changes  Recent Labs: 09/21/2021: TSH 2.840 11/26/2021: B Natriuretic Peptide 525.0 11/27/2021: ALT 12 11/28/2021: Platelets 254 11/29/2021: BUN 29; Creatinine, Ser 3.52; Potassium 3.5; Sodium 140 11/30/2021: Hemoglobin 8.2  Recent Lipid Panel    Component Value Date/Time   CHOL 197 09/21/2021 1114   CHOL 137 05/13/2013 0344   TRIG 63 09/21/2021 1114   TRIG 104 05/13/2013 0344   HDL 62 09/21/2021 1114   HDL 43 05/13/2013 0344   CHOLHDL 3.2 09/21/2021 1114   VLDL 21 05/13/2013 0344   LDLCALC 123 (H) 09/21/2021 1114   LDLCALC 73 05/13/2013 0344     Physical Exam:    VS:  BP (!) 140/50 (BP Location: Right Arm, Patient Position: Sitting, Cuff Size: Normal)   Pulse 80   Ht 5\' 3"  (1.6 m)   Wt 112 lb (50.8 kg)   SpO2 90%   BMI 19.84 kg/m     Wt Readings from Last 3 Encounters:  12/07/21 112 lb (50.8 kg)  11/30/21 110 lb 3.7 oz (50 kg)  09/21/21 111 lb 8 oz (50.6 kg)     GEN:  Well nourished, well developed in no acute distress HEENT: Normal NECK: No JVD; No carotid bruits LYMPHATICS: No  lymphadenopathy CARDIAC: RRR, + murmur, no rubs, gallops RESPIRATORY:  Clear to auscultation without rales, wheezing or rhonchi  ABDOMEN:  Soft, non-tender, non-distended MUSCULOSKELETAL:  No edema; No deformity  SKIN: Warm and dry NEUROLOGIC:  Alert and oriented x 3 PSYCHIATRIC:  Normal affect   ASSESSMENT:    1. Aortic valve stenosis, etiology of cardiac valve disease unspecified   2. Chronic diastolic CHF (congestive heart failure) ([place])   3. Chronic diastolic heart failure ([place])   4. ESRD (end stage renal disease) ([place])   5. Aortic valve disorder   6. Essential hypertension    PLAN:    In order of problems listed above:  Chronic diastolic heart failure Volume is managed mainly through dialysis three times weekly. She still makes some urine and takes Torsemide 100mg  daily. She has moderate lower leg edema on exam that she says resolves overnight. She has chronic DOE which is likely multifactorial (anemia, general deconditioning, CHF). Continue torsemide and Coreg. I will update an echocardiogram.   HTN BP is mildly elevated today, but she did not have her medications. No changes.  Aortic stenosis Last echo in 2021 showed mild to moderate AS with normal LVEF. Obvious murmur on exam. I will update an echocardiogram today. Do not suspect she is a good candidate for TAVR given age and that she is on dialysis.   Disposition: Follow up in 6 month(s) with MD/APP    Signed, Caid Radin Ninfa Meeker, PA-C  12/07/2021 10:08 AM    [facility]

## 2021-12-08 DIAGNOSIS — Z992 Dependence on renal dialysis: Secondary | ICD-10-CM | POA: Diagnosis not present

## 2021-12-08 DIAGNOSIS — N186 End stage renal disease: Secondary | ICD-10-CM | POA: Diagnosis not present

## 2021-12-10 DIAGNOSIS — Z992 Dependence on renal dialysis: Secondary | ICD-10-CM | POA: Diagnosis not present

## 2021-12-10 DIAGNOSIS — N186 End stage renal disease: Secondary | ICD-10-CM | POA: Diagnosis not present

## 2021-12-13 DIAGNOSIS — Z992 Dependence on renal dialysis: Secondary | ICD-10-CM | POA: Diagnosis not present

## 2021-12-13 DIAGNOSIS — N186 End stage renal disease: Secondary | ICD-10-CM | POA: Diagnosis not present

## 2021-12-15 DIAGNOSIS — N186 End stage renal disease: Secondary | ICD-10-CM | POA: Diagnosis not present

## 2021-12-15 DIAGNOSIS — Z992 Dependence on renal dialysis: Secondary | ICD-10-CM | POA: Diagnosis not present

## 2021-12-17 DIAGNOSIS — Z992 Dependence on renal dialysis: Secondary | ICD-10-CM | POA: Diagnosis not present

## 2021-12-17 DIAGNOSIS — N186 End stage renal disease: Secondary | ICD-10-CM | POA: Diagnosis not present

## 2021-12-20 DIAGNOSIS — N186 End stage renal disease: Secondary | ICD-10-CM | POA: Diagnosis not present

## 2021-12-20 DIAGNOSIS — Z992 Dependence on renal dialysis: Secondary | ICD-10-CM | POA: Diagnosis not present

## 2021-12-22 DIAGNOSIS — N186 End stage renal disease: Secondary | ICD-10-CM | POA: Diagnosis not present

## 2021-12-22 DIAGNOSIS — Z992 Dependence on renal dialysis: Secondary | ICD-10-CM | POA: Diagnosis not present

## 2021-12-24 ENCOUNTER — Ambulatory Visit: Payer: Self-pay

## 2021-12-24 DIAGNOSIS — Z992 Dependence on renal dialysis: Secondary | ICD-10-CM | POA: Diagnosis not present

## 2021-12-24 DIAGNOSIS — N186 End stage renal disease: Secondary | ICD-10-CM | POA: Diagnosis not present

## 2021-12-24 NOTE — Chronic Care Management (AMB) (Unsigned)
   12/24/2021  [place] [birth year] [phone]  Documentation encounter created to complete case transition. The care management team will follow for care coordination as needed.   [place] Management [phone]

## 2021-12-27 DIAGNOSIS — N186 End stage renal disease: Secondary | ICD-10-CM | POA: Diagnosis not present

## 2021-12-27 DIAGNOSIS — Z992 Dependence on renal dialysis: Secondary | ICD-10-CM | POA: Diagnosis not present

## 2021-12-29 DIAGNOSIS — N186 End stage renal disease: Secondary | ICD-10-CM | POA: Diagnosis not present

## 2021-12-29 DIAGNOSIS — Z992 Dependence on renal dialysis: Secondary | ICD-10-CM | POA: Diagnosis not present

## 2021-12-30 DIAGNOSIS — N186 End stage renal disease: Secondary | ICD-10-CM | POA: Diagnosis not present

## 2021-12-30 DIAGNOSIS — Z992 Dependence on renal dialysis: Secondary | ICD-10-CM | POA: Diagnosis not present

## 2021-12-31 DIAGNOSIS — N186 End stage renal disease: Secondary | ICD-10-CM | POA: Diagnosis not present

## 2021-12-31 DIAGNOSIS — Z992 Dependence on renal dialysis: Secondary | ICD-10-CM | POA: Diagnosis not present

## 2022-01-03 DIAGNOSIS — Z992 Dependence on renal dialysis: Secondary | ICD-10-CM | POA: Diagnosis not present

## 2022-01-03 DIAGNOSIS — N186 End stage renal disease: Secondary | ICD-10-CM | POA: Diagnosis not present

## 2022-01-05 ENCOUNTER — Encounter: Payer: Self-pay | Admitting: Internal Medicine

## 2022-01-05 DIAGNOSIS — N186 End stage renal disease: Secondary | ICD-10-CM | POA: Diagnosis not present

## 2022-01-05 DIAGNOSIS — Z992 Dependence on renal dialysis: Secondary | ICD-10-CM | POA: Diagnosis not present

## 2022-01-06 ENCOUNTER — Ambulatory Visit: Payer: Medicare Other | Attending: Medical

## 2022-01-06 DIAGNOSIS — I350 Nonrheumatic aortic (valve) stenosis: Secondary | ICD-10-CM | POA: Diagnosis not present

## 2022-01-06 LAB — ECHOCARDIOGRAM COMPLETE
AR max vel: 1.06 cm2
AV Area VTI: 1.14 cm2
AV Area mean vel: 1.15 cm2
AV Mean grad: 28 mmHg
AV Peak grad: 43.6 mmHg
Ao pk vel: 3.3 m/s
Area-P 1/2: 2.73 cm2
Calc EF: 75 %
S' Lateral: 2.2 cm
Single Plane A2C EF: 72.1 %
Single Plane A4C EF: 76.7 %

## 2022-01-07 DIAGNOSIS — Z992 Dependence on renal dialysis: Secondary | ICD-10-CM | POA: Diagnosis not present

## 2022-01-07 DIAGNOSIS — N186 End stage renal disease: Secondary | ICD-10-CM | POA: Diagnosis not present

## 2022-01-10 ENCOUNTER — Telehealth: Payer: Self-pay

## 2022-01-10 DIAGNOSIS — N186 End stage renal disease: Secondary | ICD-10-CM | POA: Diagnosis not present

## 2022-01-10 DIAGNOSIS — Z992 Dependence on renal dialysis: Secondary | ICD-10-CM | POA: Diagnosis not present

## 2022-01-10 NOTE — Telephone Encounter (Signed)
Called to give the patient echo results. Lmtcb. 

## 2022-01-10 NOTE — Telephone Encounter (Signed)
-----   Message from [place], PA-C sent at 01/09/2022  9:38 PM EDT ----- Echo showed normal pump function with impaired relaxation, mild mitral regurgitation and moderate aortic stenosis. No change since the last echo

## 2022-01-12 DIAGNOSIS — Z992 Dependence on renal dialysis: Secondary | ICD-10-CM | POA: Diagnosis not present

## 2022-01-12 DIAGNOSIS — N186 End stage renal disease: Secondary | ICD-10-CM | POA: Diagnosis not present

## 2022-01-14 DIAGNOSIS — N186 End stage renal disease: Secondary | ICD-10-CM | POA: Diagnosis not present

## 2022-01-14 DIAGNOSIS — Z992 Dependence on renal dialysis: Secondary | ICD-10-CM | POA: Diagnosis not present

## 2022-01-17 DIAGNOSIS — Z992 Dependence on renal dialysis: Secondary | ICD-10-CM | POA: Diagnosis not present

## 2022-01-17 DIAGNOSIS — N186 End stage renal disease: Secondary | ICD-10-CM | POA: Diagnosis not present

## 2022-01-19 ENCOUNTER — Other Ambulatory Visit: Payer: Self-pay | Admitting: Family Medicine

## 2022-01-19 DIAGNOSIS — Z992 Dependence on renal dialysis: Secondary | ICD-10-CM | POA: Diagnosis not present

## 2022-01-19 DIAGNOSIS — N186 End stage renal disease: Secondary | ICD-10-CM | POA: Diagnosis not present

## 2022-01-20 NOTE — Telephone Encounter (Signed)
2nd attempted to call the pt with results.lmtcb.

## 2022-01-21 DIAGNOSIS — N186 End stage renal disease: Secondary | ICD-10-CM | POA: Diagnosis not present

## 2022-01-21 DIAGNOSIS — Z992 Dependence on renal dialysis: Secondary | ICD-10-CM | POA: Diagnosis not present

## 2022-01-24 DIAGNOSIS — Z992 Dependence on renal dialysis: Secondary | ICD-10-CM | POA: Diagnosis not present

## 2022-01-24 DIAGNOSIS — N186 End stage renal disease: Secondary | ICD-10-CM | POA: Diagnosis not present

## 2022-01-25 ENCOUNTER — Other Ambulatory Visit: Payer: Self-pay

## 2022-01-25 NOTE — Telephone Encounter (Signed)
DPR on file. Lmom with echo results. Patient is to contact the office if any questions. 

## 2022-01-26 ENCOUNTER — Other Ambulatory Visit: Payer: Self-pay | Admitting: Physician Assistant

## 2022-01-26 DIAGNOSIS — N186 End stage renal disease: Secondary | ICD-10-CM | POA: Diagnosis not present

## 2022-01-26 DIAGNOSIS — Z992 Dependence on renal dialysis: Secondary | ICD-10-CM | POA: Diagnosis not present

## 2022-01-26 NOTE — Telephone Encounter (Signed)
Requested Prescriptions  Pending Prescriptions Disp Refills  . torsemide (DEMADEX) 100 MG tablet [Pharmacy Med Name: TORSEMIDE 100 MG TABLET] 90 tablet 0    Sig: TAKE 1 TABLET BY MOUTH EVERY DAY     Cardiovascular:  Diuretics - Loop Failed - 01/26/2022  2:43 AM      Failed - Cr in normal range and within 180 days    Creatinine  Date Value Ref Range Status  05/15/2013 2.27 (H) 0.60 - 1.30 mg/dL Final   Creatinine, Ser  Date Value Ref Range Status  11/29/2021 3.52 (H) 0.44 - 1.00 mg/dL Final         Failed - Mg Level in normal range and within 180 days    Magnesium  Date Value Ref Range Status  07/13/2020 2.1 1.7 - 2.4 mg/dL Final    Comment:    Performed at [facility], [address] [place], [place] [address]         Failed - Last BP in normal range    BP Readings from Last 1 Encounters:  12/07/21 (!) 140/50         Passed - K in normal range and within 180 days    Potassium  Date Value Ref Range Status  11/29/2021 3.5 3.5 - 5.1 mmol/L Final  05/15/2013 4.0 3.5 - 5.1 mmol/L Final   Potassium [facility] vascular lab)  Date Value Ref Range Status  01/15/2020 3.7 3.5 - 5.1 Final    Comment:    Performed at [facility], [address] [place], [place] [address]         [place] in normal range and within 180 days    Calcium  Date Value Ref Range Status  11/29/2021 9.2 8.9 - 10.3 mg/dL Final   Calcium, Total  Date Value Ref Range Status  05/15/2013 8.0 (L) 8.5 - 10.1 mg/dL Final   Calcium, Ion  Date Value Ref Range Status  11/13/2019 1.35 1.15 - 1.40 mmol/L Final         Passed - Na in normal range and within 180 days    Sodium  Date Value Ref Range Status  11/29/2021 140 135 - 145 mmol/L Final  09/21/2021 139 134 - 144 mmol/L Final  05/15/2013 132 (L) 136 - 145 mmol/L Final         Passed - Cl in normal range and within 180 days    Chloride  Date Value Ref Range Status  11/29/2021 109 98 - 111 mmol/L Final  05/15/2013 99 98 - 107  mmol/L Final         Passed - Valid encounter within last 6 months    Recent Outpatient Visits          4 months ago Encounter for [employer] annual wellness exam   [facility] Jerrol Banana., MD   5 months ago Hoarseness   [facility] Thedore Mins, [place], PA-C   10 months ago Type 2 diabetes mellitus with hyperlipidemia [facility])   [facility] Jerrol Banana., MD   1 year ago Type 2 diabetes mellitus with hyperlipidemia [facility])   [facility] Jerrol Banana., MD   1 year ago Annual physical exam   [facility] Jerrol Banana., MD      Future Appointments            Tomorrow Eulis Foster, MD [facility], [facility]   In 4 months [place], Boy[place] A,  MD [place]. [place]

## 2022-01-26 NOTE — Progress Notes (Signed)
Established patient visit  I,Joseline E Rosas,acting as a scribe for [employer], MD.,have documented all relevant documentation on the behalf of Eulis Foster, MD,as directed by  Eulis Foster, MD while in the presence of Eulis Foster, MD.   Patient: Lori Stanley   DOB: Feb 11, 1932   88 y.o. Female  MRN: [id number] Visit Date: 01/27/2022  Today's healthcare provider: Eulis Foster, MD   Chief Complaint  Patient presents with   Follow-up Chronic Disease and Memory issues   Subjective    HPI  Diabetes Mellitus Type II, follow-up  Lab Results  Component Value Date   HGBA1C 5.0 09/21/2021   HGBA1C 5.6 12/10/2020   HGBA1C 6.4 (H) 07/12/2020   Last seen for diabetes 4 months ago.  Manages with diet  Hypertension, follow-up  BP Readings from Last 3 Encounters:  01/27/22 (!) 157/63  12/07/21 (!) 140/50  11/30/21 (!) 138/54   Wt Readings from Last 3 Encounters:  01/27/22 107 lb 12.8 oz (48.9 kg)  12/07/21 112 lb (50.8 kg)  11/30/21 110 lb 3.7 oz (50 kg)     She was last seen for hypertension 4 months ago.  BP at that visit was 152/59. She is not exercising.  Outside blood pressures are not being checked.  She does not smoke.   Symptoms: Yes appetite changes No foot ulcerations  No chest pain No chest pressure/discomfort  No dyspnea No orthopnea  Yes fatigue Yes lower extremity edema-feet  No palpitations No paroxysmal nocturnal dyspnea  No nausea Yes numbness or tingling of extremity  No polydipsia No polyuria  No speech difficulty No syncope      Family Concerned for Patient's Memory  Patient sister attended visit today however waited in the waiting room at the patient's request.  Patient completed many mental status exam today. She denies concerns for her memory. She asked for her sister to remain in the lobby for today's visit. Patient reports completing her own ADLs and IADLS specifically  setting up her week long medication packs, preparing her own meals and managing her finances. She denies getting lost or wandering.    Knee Arthritis  Patient reports that her knee pain is the only barrier to her ambulating normally. She is using a walker to assist with ambulation today. She states that her knees feel stiff in the mornings and pain decreases as the day goes by. She wants to know if she can use Tylenol Arthritis.       01/27/2022   11:16 AM  MMSE - Mini Mental State Exam  Orientation to time 5  Orientation to Place 5  Registration 3  Attention/ Calculation 4  Recall 3  Language- name 2 objects 2  Language- repeat 1  Language- follow 3 step command 3  Language- read & follow direction 1  Write a sentence 1  Copy design 0  Total score 28    Medications: Outpatient Medications Prior to Visit  Medication Sig   acetaminophen (TYLENOL) 500 MG tablet Take 500-1,000 mg by mouth every 6 (six) hours as needed for mild pain or fever.    albuterol (VENTOLIN HFA) 108 (90 Base) MCG/ACT inhaler TAKE 2 PUFFS BY MOUTH EVERY 6 HOURS AS NEEDED FOR WHEEZE OR SHORTNESS OF BREATH (Patient taking differently: Inhale 2 puffs into the lungs every 6 (six) hours as needed for wheezing or shortness of breath.)   Bepotastine Besilate 1.5 % SOLN Place 1 drop into both eyes in the morning and at bedtime. Morning &  afternoon.   brimonidine (ALPHAGAN) 0.2 % ophthalmic solution Place 1 drop into both eyes 2 (two) times daily.    calcitRIOL (ROCALTROL) 0.25 MCG capsule Take 0.25 mcg by mouth every Monday, Wednesday, and Friday.    carvedilol (COREG) 3.125 MG tablet Take 3.125 mg by mouth 2 (two) times daily.   dorzolamide-timolol (COSOPT) 22.3-6.8 MG/ML ophthalmic solution Place 1 drop into both eyes 2 (two) times daily.    fluticasone (FLONASE) 50 MCG/ACT nasal spray Place 1 spray into both nostrils daily.   latanoprost (XALATAN) 0.005 % ophthalmic solution Place 1 drop into both eyes at bedtime.    loratadine (CLARITIN) 10 MG tablet TAKE 1 TABLET BY MOUTH EVERY DAY   multivitamin (RENA-VIT) TABS tablet Take 1 tablet by mouth daily.    omeprazole (PRILOSEC) 40 MG capsule Take 1 capsule (40 mg total) by mouth daily.   OXYGEN Inhale 2 L into the lungs at bedtime.   sertraline (ZOLOFT) 25 MG tablet TAKE 1 TABLET BY MOUTH EVERY DAY   torsemide (DEMADEX) 100 MG tablet TAKE 1 TABLET BY MOUTH EVERY DAY   [DISCONTINUED] traZODone (DESYREL) 150 MG tablet TAKE 1 TABLET BY MOUTH AT BEDTIME.   No facility-administered medications prior to visit.    Review of Systems     Objective    BP (!) 157/63 (BP Location: Right Arm, Patient Position: Sitting, Cuff Size: Small)   Pulse 60   Temp 97.7 F (36.5 C) (Oral)   Resp 16   Ht 5\' 3"  (1.6 m)   Wt 107 lb 12.8 oz (48.9 kg)   BMI 19.10 kg/m    Physical Exam Vitals reviewed.  Constitutional:      General: She is not in acute distress.    Appearance: Normal appearance. She is not ill-appearing, toxic-appearing or diaphoretic.     Comments: Using walker to assist with ambulation   Eyes:     Conjunctiva/sclera: Conjunctivae normal.  Cardiovascular:     Rate and Rhythm: Normal rate and regular rhythm.     Pulses: Normal pulses.     Heart sounds: Murmur heard.  Pulmonary:     Effort: Pulmonary effort is normal. No respiratory distress.     Breath sounds: Normal breath sounds. No stridor. No wheezing, rhonchi or rales.  Musculoskeletal:     Right knee: Swelling present. No effusion, erythema or crepitus. Tenderness present over the medial joint line. No LCL laxity, MCL laxity, ACL laxity or PCL laxity.     Instability Tests: Anterior Lachman test negative.     Left knee: Swelling present. No effusion, erythema or crepitus. Tenderness present over the medial joint line and MCL. No LCL laxity, MCL laxity, ACL laxity or PCL laxity.    Instability Tests: Anterior Lachman test negative.  Neurological:     Mental Status: She is alert and oriented  to person, place, and time.     Comments: Patient scored 28/30 MMSE  Patient demonstrates orientation to self, location, day of the week, date She is able to recall medications easily and remote facts from decades ago       No results found for any visits on 01/27/22.  Assessment & Plan     Problem List Items Addressed This Visit       Cardiovascular and Mediastinum   Essential hypertension - Primary (Chronic)    Chronic, stable  BP mildly elevated systolic today  She will continue torsemide 100mg  daily  Patient on carvedilol 3.125 mg twice daily torsemide 100 mg daily  Chronic diastolic CHF (congestive heart failure) (HCC) (Chronic)    Chronic, stable Patient will continue current medications including torsemide 100 mg daily and Coreg 3.125 mg twice daily        Endocrine   Diabetes (HCC) (Chronic)    Chronic, stable She will continue dietary management No current medications         Musculoskeletal and Integument   Primary osteoarthritis of both knees    Chronic Recommended conservative therapies such as heat therapy and physical activity is to improve morning stiffness Given patient ESRD status, did not recommend NSAIDs we discussed it is okay for the patient to take Tylenol arthritis recommended that she continue to use her walker for assistance with ambulation and safety         Other   Memory change    Patient's family will not present for this visit as the patient requested that her sisters stay in the waiting area Patient with 28/30 score on Mini-Mental status exam She answers appropriately to orientation questions during our visit and is able to recall medications and events from several years ago Patient likely has some memory changes in the setting of increased age but appears to be alert and oriented today No recommendations for medication changes or neuroimaging at this time as her neuro exam is within normal limits as well Patient has been able  to complete both ADLs and IADLs and an independent living situation so I do not think that she is unsafe at this time      Other Visit Diagnoses     Need for immunization against influenza       Relevant Orders   Flu Vaccine QUAD High Dose(Fluad) (Completed)        Return in about 3 months (around 05/10/2022).      I, Eulis Foster, MD, have reviewed all documentation for this visit. The documentation on 01/28/22 for the exam, diagnosis, procedures, and orders are all accurate and complete.    Eulis Foster, MD  [facility] [phone] (phone) [phone] (fax)  [place]

## 2022-01-27 ENCOUNTER — Ambulatory Visit: Payer: Medicare Other | Admitting: Family Medicine

## 2022-01-27 ENCOUNTER — Encounter: Payer: Self-pay | Admitting: Family Medicine

## 2022-01-27 ENCOUNTER — Ambulatory Visit (INDEPENDENT_AMBULATORY_CARE_PROVIDER_SITE_OTHER): Payer: Medicare Other | Admitting: Family Medicine

## 2022-01-27 VITALS — BP 157/63 | HR 60 | Temp 97.7°F | Resp 16 | Ht 63.0 in | Wt 107.8 lb

## 2022-01-27 DIAGNOSIS — M170 Bilateral primary osteoarthritis of knee: Secondary | ICD-10-CM

## 2022-01-27 DIAGNOSIS — I5032 Chronic diastolic (congestive) heart failure: Secondary | ICD-10-CM | POA: Diagnosis not present

## 2022-01-27 DIAGNOSIS — Z23 Encounter for immunization: Secondary | ICD-10-CM | POA: Diagnosis not present

## 2022-01-27 DIAGNOSIS — E1122 Type 2 diabetes mellitus with diabetic chronic kidney disease: Secondary | ICD-10-CM

## 2022-01-27 DIAGNOSIS — R413 Other amnesia: Secondary | ICD-10-CM

## 2022-01-27 DIAGNOSIS — N186 End stage renal disease: Secondary | ICD-10-CM | POA: Diagnosis not present

## 2022-01-27 DIAGNOSIS — Z992 Dependence on renal dialysis: Secondary | ICD-10-CM

## 2022-01-27 DIAGNOSIS — I10 Essential (primary) hypertension: Secondary | ICD-10-CM | POA: Diagnosis not present

## 2022-01-27 MED ORDER — TRAZODONE HCL 150 MG PO TABS: 150.0000 mg | ORAL_TABLET | Freq: Every day | ORAL | 0 refills | Status: DC

## 2022-01-27 NOTE — Assessment & Plan Note (Addendum)
Chronic, stable  BP mildly elevated systolic today  She will continue torsemide 100mg  daily  Patient on carvedilol 3.125 mg twice daily torsemide 100 mg daily

## 2022-01-27 NOTE — Patient Instructions (Addendum)
Vitamin E oil- ask the pharmacist for this to apply to the areas on your legs    You can also take the Arthritis Tylenol for your knee pain.

## 2022-01-28 DIAGNOSIS — R413 Other amnesia: Secondary | ICD-10-CM | POA: Insufficient documentation

## 2022-01-28 DIAGNOSIS — N186 End stage renal disease: Secondary | ICD-10-CM | POA: Diagnosis not present

## 2022-01-28 DIAGNOSIS — Z992 Dependence on renal dialysis: Secondary | ICD-10-CM | POA: Diagnosis not present

## 2022-01-28 NOTE — Assessment & Plan Note (Signed)
Chronic Recommended conservative therapies such as heat therapy and physical activity is to improve morning stiffness Given patient ESRD status, did not recommend NSAIDs we discussed it is okay for the patient to take Tylenol arthritis recommended that she continue to use her walker for assistance with ambulation and safety

## 2022-01-28 NOTE — Assessment & Plan Note (Signed)
Chronic, stable She will continue dietary management No current medications

## 2022-01-28 NOTE — Assessment & Plan Note (Signed)
Chronic, stable Patient will continue current medications including torsemide 100 mg daily and Coreg 3.125 mg twice daily

## 2022-01-28 NOTE — Assessment & Plan Note (Signed)
Patient's family will not present for this visit as the patient requested that her sisters stay in the waiting area Patient with 28/30 score on Mini-Mental status exam She answers appropriately to orientation questions during our visit and is able to recall medications and events from several years ago Patient likely has some memory changes in the setting of increased age but appears to be alert and oriented today No recommendations for medication changes or neuroimaging at this time as her neuro exam is within normal limits as well Patient has been able to complete both ADLs and IADLs and an independent living situation so I do not think that she is unsafe at this time

## 2022-03-09 ENCOUNTER — Encounter: Payer: Self-pay | Admitting: Emergency Medicine

## 2022-03-09 ENCOUNTER — Other Ambulatory Visit: Payer: Self-pay

## 2022-03-09 ENCOUNTER — Emergency Department: Payer: Medicare Other

## 2022-03-09 ENCOUNTER — Emergency Department
Admission: EM | Admit: 2022-03-09 | Discharge: 2022-03-09 | Disposition: A | Discharge status: Home / Self Care | Payer: Medicare Other | Attending: Emergency Medicine | Admitting: Emergency Medicine

## 2022-03-09 DIAGNOSIS — E1122 Type 2 diabetes mellitus with diabetic chronic kidney disease: Secondary | ICD-10-CM | POA: Diagnosis not present

## 2022-03-09 DIAGNOSIS — Z992 Dependence on renal dialysis: Secondary | ICD-10-CM | POA: Insufficient documentation

## 2022-03-09 DIAGNOSIS — N186 End stage renal disease: Secondary | ICD-10-CM | POA: Insufficient documentation

## 2022-03-09 DIAGNOSIS — R079 Chest pain, unspecified: Secondary | ICD-10-CM

## 2022-03-09 DIAGNOSIS — R0789 Other chest pain: Secondary | ICD-10-CM | POA: Diagnosis present

## 2022-03-09 DIAGNOSIS — I509 Heart failure, unspecified: Secondary | ICD-10-CM | POA: Insufficient documentation

## 2022-03-09 DIAGNOSIS — I132 Hypertensive heart and chronic kidney disease with heart failure and with stage 5 chronic kidney disease, or end stage renal disease: Secondary | ICD-10-CM | POA: Insufficient documentation

## 2022-03-09 LAB — CBC
HCT: 40.1 % (ref 36.0–46.0)
Hemoglobin: 12 g/dL (ref 12.0–15.0)
MCH: 26.5 pg (ref 26.0–34.0)
MCHC: 29.9 g/dL — ABNORMAL LOW (ref 30.0–36.0)
MCV: 88.5 fL (ref 80.0–100.0)
Platelets: 215 10*3/uL (ref 150–400)
RBC: 4.53 MIL/uL (ref 3.87–5.11)
RDW: 19.3 % — ABNORMAL HIGH (ref 11.5–15.5)
WBC: 5.9 10*3/uL (ref 4.0–10.5)
nRBC: 0 % (ref 0.0–0.2)

## 2022-03-09 LAB — BASIC METABOLIC PANEL
Anion gap: 10 (ref 5–15)
BUN: 28 mg/dL — ABNORMAL HIGH (ref 8–23)
CO2: 26 mmol/L (ref 22–32)
Calcium: 8.9 mg/dL (ref 8.9–10.3)
Chloride: 106 mmol/L (ref 98–111)
Creatinine, Ser: 3.15 mg/dL — ABNORMAL HIGH (ref 0.44–1.00)
GFR, Estimated: 14 mL/min — ABNORMAL LOW (ref >60)
Glucose, Bld: 142 mg/dL — ABNORMAL HIGH (ref 70–99)
Potassium: 3.4 mmol/L — ABNORMAL LOW (ref 3.5–5.1)
Sodium: 142 mmol/L (ref 135–145)

## 2022-03-09 LAB — TROPONIN I (HIGH SENSITIVITY)
Troponin I (High Sensitivity): 4 ng/L (ref <18)
Troponin I (High Sensitivity): 5 ng/L (ref <18)

## 2022-03-09 MED ORDER — BRIMONIDINE TARTRATE 0.2 % OP SOLN: 1.0000 [drp] | Freq: Two times a day (BID) | OPHTHALMIC | 0 refills | Status: AC

## 2022-03-09 MED ORDER — BEPOTASTINE BESILATE 1.5 % OP SOLN: 1.0000 [drp] | Freq: Two times a day (BID) | OPHTHALMIC | 0 refills | Status: AC

## 2022-03-09 MED ORDER — DORZOLAMIDE HCL-TIMOLOL MAL 2-0.5 % OP SOLN: 1.0000 [drp] | Freq: Two times a day (BID) | OPHTHALMIC | 0 refills | Status: AC

## 2022-03-09 MED ORDER — LATANOPROST 0.005 % OP SOLN: 1.0000 [drp] | Freq: Every day | OPHTHALMIC | 0 refills | Status: AC

## 2022-03-09 NOTE — ED Triage Notes (Signed)
Patient to ED via [facility] from dialysis for chest pain. Patient was 1 hr into dialysis tx when her HR dropped down into the 30's and started having CP. Per EMS, no currently CP and HR in the 60's.

## 2022-03-09 NOTE — ED Notes (Signed)
Patient missing walker. C Com called by Lattie Haw, Network engineer. Lori Stanley is at dialysis with patient's brown colored wallet.

## 2022-03-09 NOTE — ED Provider Notes (Signed)
[facility] Provider Note    Event Date/Time   First MD Initiated Contact with Patient 03/09/22 1422     (approximate)   History   Chief Complaint: Chest Pain   HPI  Lori Stanley is a 88 y.o. female with a history of ESRD on hemodialysis, diabetes, GERD, hypertension, heart failure who was in the midst of her usual dialysis session today when she started having central chest pain that lasted about 10 minutes.  Not exertional.  Nonradiating.  No shortness of breath diaphoresis or vomiting.  Reportedly she was noted to have a heart rate in the 30s at that time.  Patient reports being in her usual state of health prior to this episode.     Physical Exam   Triage Vital Signs: ED Triage Vitals  Enc Vitals Group     BP 03/09/22 1215 (!) 165/64     Pulse Rate 03/09/22 1215 68     Resp 03/09/22 1215 18     Temp 03/09/22 1215 97.9 F (36.6 C)     Temp Source 03/09/22 1215 Oral     SpO2 03/09/22 1213 100 %     Weight 03/09/22 1215 115 lb (52.2 kg)     Height 03/09/22 1215 5\' 3"  (1.6 m)     Head Circumference --      Peak Flow --      Pain Score 03/09/22 1215 0     Pain Loc --      Pain Edu? --      Excl. in [place]? --     Most recent vital signs: Vitals:   03/09/22 1215 03/09/22 1425  BP: (!) 165/64   Pulse: 68 68  Resp: 18 19  Temp: 97.9 F (36.6 C) 98.7 F (37.1 C)  SpO2: 98% 95%    General: Awake, no distress.  CV:  Good peripheral perfusion.  Regular rate and rhythm Resp:  Normal effort.  Clear to auscultation bilaterally Abd:  No distention.  Soft, nontender Other:  No lower extremity edema, no calf tenderness.  Moist oral mucosa.  Clamps present on recently access left upper extremity AV fistula.  No bleeding.   ED Results / Procedures / Treatments   Labs (all labs ordered are listed, but only abnormal results are displayed) Labs Reviewed  BASIC METABOLIC PANEL - Abnormal; Notable for the following components:      Result Value    Potassium 3.4 (*)    Glucose, Bld 142 (*)    BUN 28 (*)    Creatinine, Ser 3.15 (*)    GFR, Estimated 14 (*)    All other components within normal limits  CBC - Abnormal; Notable for the following components:   MCHC 29.9 (*)    RDW 19.3 (*)    All other components within normal limits  TROPONIN I (HIGH SENSITIVITY)  TROPONIN I (HIGH SENSITIVITY)     EKG Interpreted by me Normal sinus rhythm rate of 66.  Normal axis, normal intervals.  Voltage criteria for LVH in the high lateral leads.  No acute ischemic changes.   RADIOLOGY Chest x-ray interpreted by me, negative for edema effusion or consolidation.  No pneumothorax.  Radiology report reviewed.   PROCEDURES:  Procedures   MEDICATIONS ORDERED IN ED: Medications - No data to display   IMPRESSION / MDM / [place] / ED COURSE  I reviewed the triage vital signs and the nursing notes.  Differential diagnosis includes, but is not limited to, GERD, vagal episode, non-STEMI, pneumothorax, pleural effusion, pulmonary edema, anxiety, bronchospasm  Patient's presentation is most consistent with acute presentation with potential threat to life or bodily function.  Patient presents with an episode of atypical chest pain occurring at rest, now resolved.  Vital signs and exam are unremarkable.  EKG chest x-ray and initial labs are all reassuring.  We will obtain repeat troponin.  If trend is flat, I think she is stable for discharge home to continue usual care and plan outpatient follow-up with her cardiologist.       FINAL CLINICAL IMPRESSION(S) / ED DIAGNOSES   Final diagnoses:  Nonspecific chest pain  ESRD on hemodialysis ([place])  Chronic congestive heart failure, unspecified heart failure type ([place])     Rx / DC Orders   ED Discharge Orders          Ordered    Bepotastine Besilate 1.5 % SOLN  2 times daily        03/09/22 1438    brimonidine (ALPHAGAN) 0.2 % ophthalmic  solution  2 times daily        03/09/22 1438    dorzolamide-timolol (COSOPT) 2-0.5 % ophthalmic solution  2 times daily        03/09/22 1438    latanoprost (XALATAN) 0.005 % ophthalmic solution  Daily at bedtime        03/09/22 1438             Note:  This document was prepared using Dragon voice recognition software and may include unintentional dictation errors.   Carrie Mew, MD 03/09/22 [phone]

## 2022-04-24 ENCOUNTER — Other Ambulatory Visit: Payer: Self-pay | Admitting: Physician Assistant

## 2022-05-02 ENCOUNTER — Other Ambulatory Visit: Payer: Self-pay | Admitting: Physician Assistant

## 2022-05-02 DIAGNOSIS — R490 Dysphonia: Secondary | ICD-10-CM

## 2022-05-03 NOTE — Telephone Encounter (Signed)
Requested Prescriptions  Pending Prescriptions Disp Refills   fluticasone (FLONASE) 50 MCG/ACT nasal spray [Pharmacy Med Name: FLUTICASONE PROP 50 MCG SPRAY] 16 mL 2    Sig: SPRAY 1 SPRAY INTO BOTH NOSTRILS DAILY.     Ear, Nose, and Throat: Nasal Preparations - Corticosteroids Passed - 05/02/2022  3:18 PM      Passed - Valid encounter within last 12 months    Recent Outpatient Visits           3 months ago Essential hypertension   [place], [place], MD   7 months ago Encounter for [employer] annual wellness exam   [facility] Jerrol Banana., MD   9 months ago [address] Thedore Mins, [place], PA-C   1 year ago Type 2 diabetes mellitus with hyperlipidemia [facility])   [facility] Jerrol Banana., MD   1 year ago Type 2 diabetes mellitus with hyperlipidemia [facility])   [facility] Jerrol Banana., MD       Future Appointments             In 1 week Simmons-Robinson, Riki Sheer, MD [facility] [place]   In 1 month Fletcher Anon, Mertie Clause, MD [place] [place]

## 2022-05-04 DIAGNOSIS — Z992 Dependence on renal dialysis: Secondary | ICD-10-CM | POA: Diagnosis not present

## 2022-05-04 DIAGNOSIS — N186 End stage renal disease: Secondary | ICD-10-CM | POA: Diagnosis not present

## 2022-05-04 DIAGNOSIS — N2581 Secondary hyperparathyroidism of renal origin: Secondary | ICD-10-CM | POA: Diagnosis not present

## 2022-05-06 DIAGNOSIS — Z992 Dependence on renal dialysis: Secondary | ICD-10-CM | POA: Diagnosis not present

## 2022-05-06 DIAGNOSIS — N186 End stage renal disease: Secondary | ICD-10-CM | POA: Diagnosis not present

## 2022-05-06 DIAGNOSIS — N2581 Secondary hyperparathyroidism of renal origin: Secondary | ICD-10-CM | POA: Diagnosis not present

## 2022-05-09 DIAGNOSIS — Z992 Dependence on renal dialysis: Secondary | ICD-10-CM | POA: Diagnosis not present

## 2022-05-09 DIAGNOSIS — N2581 Secondary hyperparathyroidism of renal origin: Secondary | ICD-10-CM | POA: Diagnosis not present

## 2022-05-09 DIAGNOSIS — N186 End stage renal disease: Secondary | ICD-10-CM | POA: Diagnosis not present

## 2022-05-09 NOTE — Progress Notes (Unsigned)
      Established patient visit   Patient: Lori Stanley   DOB: 1931-11-26   89 y.o. Female  MRN: [id number] Visit Date: 05/10/2022  Today's healthcare provider: Eulis Foster, MD   No chief complaint on file.  Subjective    HPI  Arthritis Follow Up Patient was last seen for arthritis in Sept of 2023  She was recommended to use heat therapy and physical activity is to improve morning stiffness as well as continue Tylenol arthritis, recommended that she continue to use her walker for assistance with ambulation & safe mobility   Medications: Outpatient Medications Prior to Visit  Medication Sig   acetaminophen (TYLENOL) 500 MG tablet Take 500-1,000 mg by mouth every 6 (six) hours as needed for mild pain or fever.    albuterol (VENTOLIN HFA) 108 (90 Base) MCG/ACT inhaler TAKE 2 PUFFS BY MOUTH EVERY 6 HOURS AS NEEDED FOR WHEEZE OR SHORTNESS OF BREATH (Patient taking differently: Inhale 2 puffs into the lungs every 6 (six) hours as needed for wheezing or shortness of breath.)   calcitRIOL (ROCALTROL) 0.25 MCG capsule Take 0.25 mcg by mouth every Monday, Wednesday, and Friday.    carvedilol (COREG) 3.125 MG tablet Take 3.125 mg by mouth 2 (two) times daily.   fluticasone (FLONASE) 50 MCG/ACT nasal spray SPRAY 1 SPRAY INTO BOTH NOSTRILS DAILY.   loratadine (CLARITIN) 10 MG tablet TAKE 1 TABLET BY MOUTH EVERY DAY   multivitamin (RENA-VIT) TABS tablet Take 1 tablet by mouth daily.    omeprazole (PRILOSEC) 40 MG capsule Take 1 capsule (40 mg total) by mouth daily.   OXYGEN Inhale 2 L into the lungs at bedtime.   sertraline (ZOLOFT) 25 MG tablet TAKE 1 TABLET BY MOUTH EVERY DAY   torsemide (DEMADEX) 100 MG tablet TAKE 1 TABLET BY MOUTH EVERY DAY   traZODone (DESYREL) 150 MG tablet Take 1 tablet (150 mg total) by mouth at bedtime.   No facility-administered medications prior to visit.    Review of Systems  {Labs  Heme  Chem  Endocrine  Serology  Results Review  (optional):23779}   Objective    There were no vitals taken for this visit. {Show previous vital signs (optional):23777}  Physical Exam  ***  No results found for any visits on 05/10/22.  Assessment & Plan     Problem List Items Addressed This Visit   None    No follow-ups on file.      I, Eulis Foster, MD, have reviewed all documentation for this visit.  Portions of this information were initially documented by the CMA and reviewed by me for thoroughness and accuracy.      Eulis Foster, MD  [facility] [phone] (phone) [phone] (fax)  [place]

## 2022-05-10 ENCOUNTER — Encounter: Payer: Self-pay | Admitting: Family Medicine

## 2022-05-10 ENCOUNTER — Ambulatory Visit (INDEPENDENT_AMBULATORY_CARE_PROVIDER_SITE_OTHER): Payer: Medicare Other | Admitting: Family Medicine

## 2022-05-10 VITALS — BP 155/67 | HR 85 | Temp 98.2°F | Wt 104.1 lb

## 2022-05-10 DIAGNOSIS — M170 Bilateral primary osteoarthritis of knee: Secondary | ICD-10-CM | POA: Diagnosis not present

## 2022-05-10 NOTE — Assessment & Plan Note (Signed)
Declines steroid injections  Recommended continued arthritis tylenol Prescription written for bilateral supportive knee braces  Follow up PRN

## 2022-05-11 DIAGNOSIS — N2581 Secondary hyperparathyroidism of renal origin: Secondary | ICD-10-CM | POA: Diagnosis not present

## 2022-05-11 DIAGNOSIS — N186 End stage renal disease: Secondary | ICD-10-CM | POA: Diagnosis not present

## 2022-05-11 DIAGNOSIS — Z992 Dependence on renal dialysis: Secondary | ICD-10-CM | POA: Diagnosis not present

## 2022-05-13 DIAGNOSIS — N2581 Secondary hyperparathyroidism of renal origin: Secondary | ICD-10-CM | POA: Diagnosis not present

## 2022-05-13 DIAGNOSIS — Z992 Dependence on renal dialysis: Secondary | ICD-10-CM | POA: Diagnosis not present

## 2022-05-13 DIAGNOSIS — N186 End stage renal disease: Secondary | ICD-10-CM | POA: Diagnosis not present

## 2022-05-16 DIAGNOSIS — Z992 Dependence on renal dialysis: Secondary | ICD-10-CM | POA: Diagnosis not present

## 2022-05-16 DIAGNOSIS — N2581 Secondary hyperparathyroidism of renal origin: Secondary | ICD-10-CM | POA: Diagnosis not present

## 2022-05-16 DIAGNOSIS — N186 End stage renal disease: Secondary | ICD-10-CM | POA: Diagnosis not present

## 2022-05-18 DIAGNOSIS — E119 Type 2 diabetes mellitus without complications: Secondary | ICD-10-CM | POA: Diagnosis not present

## 2022-05-18 DIAGNOSIS — N186 End stage renal disease: Secondary | ICD-10-CM | POA: Diagnosis not present

## 2022-05-18 DIAGNOSIS — N2581 Secondary hyperparathyroidism of renal origin: Secondary | ICD-10-CM | POA: Diagnosis not present

## 2022-05-18 DIAGNOSIS — Z992 Dependence on renal dialysis: Secondary | ICD-10-CM | POA: Diagnosis not present

## 2022-05-20 DIAGNOSIS — Z992 Dependence on renal dialysis: Secondary | ICD-10-CM | POA: Diagnosis not present

## 2022-05-20 DIAGNOSIS — N2581 Secondary hyperparathyroidism of renal origin: Secondary | ICD-10-CM | POA: Diagnosis not present

## 2022-05-20 DIAGNOSIS — N186 End stage renal disease: Secondary | ICD-10-CM | POA: Diagnosis not present

## 2022-05-23 DIAGNOSIS — N2581 Secondary hyperparathyroidism of renal origin: Secondary | ICD-10-CM | POA: Diagnosis not present

## 2022-05-23 DIAGNOSIS — N186 End stage renal disease: Secondary | ICD-10-CM | POA: Diagnosis not present

## 2022-05-23 DIAGNOSIS — Z992 Dependence on renal dialysis: Secondary | ICD-10-CM | POA: Diagnosis not present

## 2022-05-25 DIAGNOSIS — Z992 Dependence on renal dialysis: Secondary | ICD-10-CM | POA: Diagnosis not present

## 2022-05-25 DIAGNOSIS — N186 End stage renal disease: Secondary | ICD-10-CM | POA: Diagnosis not present

## 2022-05-25 DIAGNOSIS — N2581 Secondary hyperparathyroidism of renal origin: Secondary | ICD-10-CM | POA: Diagnosis not present

## 2022-05-27 DIAGNOSIS — N186 End stage renal disease: Secondary | ICD-10-CM | POA: Diagnosis not present

## 2022-05-27 DIAGNOSIS — Z992 Dependence on renal dialysis: Secondary | ICD-10-CM | POA: Diagnosis not present

## 2022-05-27 DIAGNOSIS — N2581 Secondary hyperparathyroidism of renal origin: Secondary | ICD-10-CM | POA: Diagnosis not present

## 2022-05-30 DIAGNOSIS — N186 End stage renal disease: Secondary | ICD-10-CM | POA: Diagnosis not present

## 2022-05-30 DIAGNOSIS — Z992 Dependence on renal dialysis: Secondary | ICD-10-CM | POA: Diagnosis not present

## 2022-05-30 DIAGNOSIS — N2581 Secondary hyperparathyroidism of renal origin: Secondary | ICD-10-CM | POA: Diagnosis not present

## 2022-06-01 ENCOUNTER — Encounter: Payer: Self-pay | Admitting: Internal Medicine

## 2022-06-01 DIAGNOSIS — N186 End stage renal disease: Secondary | ICD-10-CM | POA: Diagnosis not present

## 2022-06-01 DIAGNOSIS — N2581 Secondary hyperparathyroidism of renal origin: Secondary | ICD-10-CM | POA: Diagnosis not present

## 2022-06-01 DIAGNOSIS — Z992 Dependence on renal dialysis: Secondary | ICD-10-CM | POA: Diagnosis not present

## 2022-06-03 DIAGNOSIS — N2581 Secondary hyperparathyroidism of renal origin: Secondary | ICD-10-CM | POA: Diagnosis not present

## 2022-06-03 DIAGNOSIS — Z992 Dependence on renal dialysis: Secondary | ICD-10-CM | POA: Diagnosis not present

## 2022-06-03 DIAGNOSIS — N186 End stage renal disease: Secondary | ICD-10-CM | POA: Diagnosis not present

## 2022-06-05 ENCOUNTER — Other Ambulatory Visit: Payer: Self-pay | Admitting: Family Medicine

## 2022-06-05 DIAGNOSIS — F329 Major depressive disorder, single episode, unspecified: Secondary | ICD-10-CM

## 2022-06-06 DIAGNOSIS — N2581 Secondary hyperparathyroidism of renal origin: Secondary | ICD-10-CM | POA: Diagnosis not present

## 2022-06-06 DIAGNOSIS — N186 End stage renal disease: Secondary | ICD-10-CM | POA: Diagnosis not present

## 2022-06-06 DIAGNOSIS — Z992 Dependence on renal dialysis: Secondary | ICD-10-CM | POA: Diagnosis not present

## 2022-06-06 NOTE — Telephone Encounter (Signed)
Requested Prescriptions  Pending Prescriptions Disp Refills   sertraline (ZOLOFT) 25 MG tablet [Pharmacy Med Name: SERTRALINE HCL 25 MG TABLET] 90 tablet 0    Sig: TAKE 1 TABLET BY MOUTH EVERY DAY     Psychiatry:  Antidepressants - SSRI - sertraline Passed - 06/05/2022  1:07 AM      Passed - AST in normal range and within 360 days    AST  Date Value Ref Range Status  11/27/2021 15 15 - 41 U/L Final   SGOT(AST)  Date Value Ref Range Status  05/11/2013 46 (H) 15 - 37 Unit/L Final         Passed - ALT in normal range and within 360 days    ALT  Date Value Ref Range Status  11/27/2021 12 0 - 44 U/L Final   SGPT (ALT)  Date Value Ref Range Status  05/11/2013 33 12 - 78 U/L Final         Passed - Completed PHQ-2 or PHQ-9 in the last 360 days      Passed - Valid encounter within last 6 months    Recent Outpatient Visits           3 weeks ago Primary osteoarthritis of both knees   [place] Simmons-Robinson, [place], MD   4 months ago Essential hypertension   [place] [employer], [place], MD   8 months ago Encounter for [employer] annual wellness exam   [facility] Eulas Post, MD   10 months ago [place] Mikey Kirschner, PA-C   1 year ago Type 2 diabetes mellitus with hyperlipidemia [facility])   [place] Eulas Post, MD       Future Appointments             In 3 days Wellington Hampshire, MD [place] at [facility]

## 2022-06-08 DIAGNOSIS — Z992 Dependence on renal dialysis: Secondary | ICD-10-CM | POA: Diagnosis not present

## 2022-06-08 DIAGNOSIS — N2581 Secondary hyperparathyroidism of renal origin: Secondary | ICD-10-CM | POA: Diagnosis not present

## 2022-06-08 DIAGNOSIS — N186 End stage renal disease: Secondary | ICD-10-CM | POA: Diagnosis not present

## 2022-06-09 ENCOUNTER — Encounter: Payer: Self-pay | Admitting: Cardiovascular Disease

## 2022-06-09 ENCOUNTER — Ambulatory Visit: Payer: 59 | Attending: Cardiovascular Disease | Admitting: Cardiovascular Disease

## 2022-06-09 VITALS — BP 170/66 | HR 76 | Ht 63.0 in | Wt 110.5 lb

## 2022-06-09 DIAGNOSIS — I5032 Chronic diastolic (congestive) heart failure: Secondary | ICD-10-CM

## 2022-06-09 DIAGNOSIS — I359 Nonrheumatic aortic valve disorder, unspecified: Secondary | ICD-10-CM | POA: Diagnosis not present

## 2022-06-09 DIAGNOSIS — I10 Essential (primary) hypertension: Secondary | ICD-10-CM | POA: Diagnosis not present

## 2022-06-09 NOTE — Progress Notes (Signed)
Cardiology Office Note   Date:  06/09/2022   ID:  Lori, Stanley [birth year], MRN [id number]  PCP:  Eulis Foster, MD  Cardiologist:   Kathlyn Sacramento, MD   Chief Complaint  Patient presents with   6 month follow up     Patient c/o shortness of breath with exertion. Medications reviewed by the patient's bottles.       History of Present Illness: Lori Stanley is a 89 y.o. female who presents for a followup visit regarding chronic diastolic heart failure and aortic stenosis. She has known history of hypertension, diabetes and end-stage renal disease on hemodialysis.  Nuclear stress test in January, 2015  showed no evidence of ischemia with normal ejection fraction.  She has been hospitalized in the past for anemia with a hemoglobin of 6.5.  EGD showed no source of bleeding. Most recent echocardiogram in September 2023 showed normal LV systolic function, moderate mitral regurgitation and moderate aortic stenosis with a mean gradient of 28 mmHg and valve area of 1.14 cm. She has been doing reasonably well with no chest pain or worsening dyspnea.  She lives by herself but gets help from her sister and niece who live close by.  She walks slowly with a walker and has to use a wheelchair frequently.  Past Medical History:  Diagnosis Date   Acute heart failure (HCC)    Acute on chronic heart failure with preserved ejection fraction (HFpEF) (Lori [facility]) 06/05/2019   Anemia    Anemia due to stage 4 chronic kidney disease (Lori Stanley) 08/27/2019   Anxiety    CHF (congestive heart failure) (HCC)    Chronic kidney disease    stage V; end stage renal disease   Chronic kidney disease, stage 5 (Lori [facility]) 01/23/2019   Chronic low back pain    Depression    Diabetes mellitus without complication (Lori [facility])    Essential hypertension, malignant 05/24/2013   GERD (gastroesophageal reflux disease)    Hematoma 07/17/2020   Hyperlipidemia    Hypertension    MI (myocardial infarction) (Lori Stanley)    Obesity     Proteinuria 01/23/2019   Respiratory failure (Lori [facility]) 04/09/2017   SOB (shortness of breath) 05/24/2013   Unilateral inguinal hernia with obstruction and without gangrene    Volume overload 08/30/2019    Past Surgical History:  Procedure Laterality Date   A/V FISTULAGRAM Left 01/15/2020   Procedure: A/V FISTULAGRAM;  Surgeon: Katha Cabal, MD;  Location: [place] [facility];  Service: Cardiovascular;  Laterality: Left;   ABDOMINAL HYSTERECTOMY     APPENDECTOMY     AV FISTULA PLACEMENT Left 11/13/2019   Procedure: INSERTION OF ARTERIOVENOUS (AV) GORE-TEX GRAFT ARM ( BRACHIAL AXILLARY );  Surgeon: Katha Cabal, MD;  Location: [facility];  Service: Vascular;  Laterality: Left;   DIALYSIS/PERMA CATHETER INSERTION N/A 06/07/2019   Procedure: DIALYSIS/PERMA CATHETER INSERTION;  Surgeon: Katha Cabal, MD;  Location: [place] [facility];  Service: Cardiovascular;  Laterality: N/A;   DIALYSIS/PERMA CATHETER REMOVAL N/A 02/11/2020   Procedure: DIALYSIS/PERMA CATHETER REMOVAL;  Surgeon: Katha Cabal, MD;  Location: [facility];  Service: Cardiovascular;  Laterality: N/A;   ESOPHAGOGASTRODUODENOSCOPY (EGD) WITH PROPOFOL N/A 11/30/2021   Procedure: ESOPHAGOGASTRODUODENOSCOPY (EGD) WITH PROPOFOL;  Surgeon: Lin Landsman, MD;  Location: [facility];  Service: Gastroenterology;  Laterality: N/A;   EYE SURGERY     HERNIA REPAIR     INCISION AND DRAINAGE PERIRECTAL ABSCESS     INGUINAL HERNIA REPAIR N/A 07/12/2020  Procedure: HERNIA REPAIR INGUINAL ADULT;  Surgeon: Lori Ree, MD;  Location: [facility];  Service: General;  Laterality: N/A;   KNEE SURGERY     THYROID SURGERY     VAGINAL HYSTERECTOMY       Current Outpatient Medications  Medication Sig Dispense Refill   acetaminophen (TYLENOL) 500 MG tablet Take 500-1,000 mg by mouth every 6 (six) hours as needed for mild pain or fever.      albuterol (VENTOLIN HFA) 108 (90 Base) MCG/ACT inhaler TAKE 2 PUFFS BY MOUTH  EVERY 6 HOURS AS NEEDED FOR WHEEZE OR SHORTNESS OF BREATH (Patient taking differently: Inhale 2 puffs into the lungs every 6 (six) hours as needed for wheezing or shortness of breath.) 8 g 1   calcitRIOL (ROCALTROL) 0.25 MCG capsule Take 0.25 mcg by mouth every Monday, Wednesday, and Friday.      carvedilol (COREG) 3.125 MG tablet Take 3.125 mg by mouth 2 (two) times daily.     fluticasone (FLONASE) 50 MCG/ACT nasal spray SPRAY 1 SPRAY INTO BOTH NOSTRILS DAILY. 16 mL 2   loratadine (CLARITIN) 10 MG tablet TAKE 1 TABLET BY MOUTH EVERY DAY 90 tablet 3   multivitamin (RENA-VIT) TABS tablet Take 1 tablet by mouth daily.      omeprazole (PRILOSEC) 40 MG capsule Take 1 capsule (40 mg total) by mouth daily. 30 capsule 2   OXYGEN Inhale 2 L into the lungs at bedtime.     sertraline (ZOLOFT) 25 MG tablet TAKE 1 TABLET BY MOUTH EVERY DAY 90 tablet 0   torsemide (DEMADEX) 100 MG tablet TAKE 1 TABLET BY MOUTH EVERY DAY 90 tablet 0   traZODone (DESYREL) 150 MG tablet Take 1 tablet (150 mg total) by mouth at bedtime. 90 tablet 0   No current facility-administered medications for this visit.    Allergies:   Antihistamines, chlorpheniramine-type; Other; Paxil [paroxetine]; and Codeine    Social History:  The patient  reports that she has quit smoking. Her smoking use included cigarettes. She has never used smokeless tobacco. She reports that she does not drink alcohol and does not use drugs.   Family History:  The patient's family history includes Cancer in her sister; Heart attack in her mother; Heart disease in her sister.    ROS:  Please see the history of present illness.   Otherwise, review of systems are positive for none.   All other systems are reviewed and negative.    PHYSICAL EXAM: VS:  BP (!) 170/66 (BP Location: Left Arm, Patient Position: Sitting, Cuff Size: Normal)   Pulse 76   Ht 5\' 3"  (1.6 m)   Wt 110 lb 8 oz (50.1 kg)   BMI 19.57 kg/m  , BMI Body mass index is 19.57 kg/m. GEN:  Well nourished, well developed, in no acute distress  HEENT: normal  Neck: no JVD, carotid bruits, or masses Cardiac: RRR; no rubs, or gallops,no edema . 3/ 6 systolic ejection murmur in the aortic area which is mid peaking. Respiratory:  clear to auscultation bilaterally, normal work of breathing GI: soft, nontender, nondistended, + BS MS: no deformity or atrophy  Skin: warm and dry, no rash Neuro:  Strength and sensation are intact Psych: euthymic mood, full affect She has a functioning fistula in the left arm.   EKG:  EKG is ordered today. The ekg ordered today demonstrates  normal sinus rhythm with no significant ST or T wave changes.  Left axis deviation, left atrial enlargement as well as left ventricular hypertrophy.  Recent Labs: 09/21/2021: TSH 2.840 11/26/2021: B Natriuretic Peptide 525.0 11/27/2021: ALT 12 03/09/2022: BUN 28; Creatinine, Ser 3.15; Hemoglobin 12.0; Platelets 215; Potassium 3.4; Sodium 142    Lipid Panel    Component Value Date/Time   CHOL 197 09/21/2021 1114   CHOL 137 05/13/2013 0344   TRIG 63 09/21/2021 1114   TRIG 104 05/13/2013 0344   HDL 62 09/21/2021 1114   HDL 43 05/13/2013 0344   CHOLHDL 3.2 09/21/2021 1114   VLDL 21 05/13/2013 0344   LDLCALC 123 (H) 09/21/2021 1114   LDLCALC 73 05/13/2013 0344      Wt Readings from Last 3 Encounters:  06/09/22 110 lb 8 oz (50.1 kg)  05/10/22 104 lb 1.6 oz (47.2 kg)  03/09/22 115 lb (52.2 kg)          No data to display            ASSESSMENT AND PLAN:  1.  Chronic diastolic heart failure: She is doing very well overall with no evidence of volume overload.  Fluid management is done via dialysis but at the same time she is on torsemide 100 mg once daily as she continues to make urine.  2. Essential hypertension: Blood pressure is elevated but her blood pressure drops on dialysis days on Monday Wednesday and Friday.  In addition, she has underlying moderate aortic stenosis and I do not recommend  aggressive lowering of her blood pressure.  3.  Aortic stenosis: I reviewed the results of most recent echocardiogram which showed normal LV systolic function with moderate aortic stenosis.  Even if this gets worse in the future, I doubt that she will be a good candidate for TAVR given her age, poor functional capacity and dialysis status.   Disposition:   FU with me in 6 months.  Signed,  Kathlyn Sacramento, MD  06/09/2022 10:09 AM    [place]

## 2022-06-09 NOTE — Patient Instructions (Signed)
Medication Instructions:  No changes *If you need a refill on your cardiac medications before your next appointment, please call your pharmacy*   Lab Work: None ordered If you have labs (blood work) drawn today and your tests are completely normal, you will receive your results only by: MyChart Message (if you have MyChart) OR A paper copy in the mail If you have any lab test that is abnormal or we need to change your treatment, we will call you to review the results.   Testing/Procedures: None ordered   Follow-Up: At [facility], you and your health needs are our priority.  As part of our continuing mission to provide you with exceptional heart care, we have created designated Provider Care Teams.  These Care Teams include your primary Cardiologist (physician) and Advanced Practice Providers (APPs -  Physician Assistants and Nurse Practitioners) who all work together to provide you with the care you need, when you need it.  We recommend signing up for the patient portal called "MyChart".  Sign up information is provided on this After Visit Summary.  MyChart is used to connect with patients for Virtual Visits (Telemedicine).  Patients are able to view lab/test results, encounter notes, upcoming appointments, etc.  Non-urgent messages can be sent to your provider as well.   To learn more about what you can do with MyChart, go to [email or web address].    Your next appointment:   6 month(s)  Provider:   You may see Muhammad Arida, MD or one of the following Advanced Practice Providers on your designated Care Team:   Christopher Berge, NP Ryan Dunn, PA-C Cadence Furth, PA-C Sheri Hammock, NP    

## 2022-06-10 DIAGNOSIS — N186 End stage renal disease: Secondary | ICD-10-CM | POA: Diagnosis not present

## 2022-06-10 DIAGNOSIS — N2581 Secondary hyperparathyroidism of renal origin: Secondary | ICD-10-CM | POA: Diagnosis not present

## 2022-06-10 DIAGNOSIS — H401133 Primary open-angle glaucoma, bilateral, severe stage: Secondary | ICD-10-CM | POA: Diagnosis not present

## 2022-06-10 DIAGNOSIS — Z992 Dependence on renal dialysis: Secondary | ICD-10-CM | POA: Diagnosis not present

## 2022-06-13 DIAGNOSIS — N2581 Secondary hyperparathyroidism of renal origin: Secondary | ICD-10-CM | POA: Diagnosis not present

## 2022-06-13 DIAGNOSIS — Z992 Dependence on renal dialysis: Secondary | ICD-10-CM | POA: Diagnosis not present

## 2022-06-13 DIAGNOSIS — N186 End stage renal disease: Secondary | ICD-10-CM | POA: Diagnosis not present

## 2022-06-15 DIAGNOSIS — N2581 Secondary hyperparathyroidism of renal origin: Secondary | ICD-10-CM | POA: Diagnosis not present

## 2022-06-15 DIAGNOSIS — N186 End stage renal disease: Secondary | ICD-10-CM | POA: Diagnosis not present

## 2022-06-15 DIAGNOSIS — Z992 Dependence on renal dialysis: Secondary | ICD-10-CM | POA: Diagnosis not present

## 2022-06-17 DIAGNOSIS — N2581 Secondary hyperparathyroidism of renal origin: Secondary | ICD-10-CM | POA: Diagnosis not present

## 2022-06-17 DIAGNOSIS — N186 End stage renal disease: Secondary | ICD-10-CM | POA: Diagnosis not present

## 2022-06-17 DIAGNOSIS — Z992 Dependence on renal dialysis: Secondary | ICD-10-CM | POA: Diagnosis not present

## 2022-06-20 DIAGNOSIS — Z992 Dependence on renal dialysis: Secondary | ICD-10-CM | POA: Diagnosis not present

## 2022-06-20 DIAGNOSIS — N2581 Secondary hyperparathyroidism of renal origin: Secondary | ICD-10-CM | POA: Diagnosis not present

## 2022-06-20 DIAGNOSIS — N186 End stage renal disease: Secondary | ICD-10-CM | POA: Diagnosis not present

## 2022-06-22 DIAGNOSIS — N186 End stage renal disease: Secondary | ICD-10-CM | POA: Diagnosis not present

## 2022-06-22 DIAGNOSIS — N2581 Secondary hyperparathyroidism of renal origin: Secondary | ICD-10-CM | POA: Diagnosis not present

## 2022-06-22 DIAGNOSIS — Z992 Dependence on renal dialysis: Secondary | ICD-10-CM | POA: Diagnosis not present

## 2022-06-24 DIAGNOSIS — Z992 Dependence on renal dialysis: Secondary | ICD-10-CM | POA: Diagnosis not present

## 2022-06-24 DIAGNOSIS — N186 End stage renal disease: Secondary | ICD-10-CM | POA: Diagnosis not present

## 2022-06-24 DIAGNOSIS — N2581 Secondary hyperparathyroidism of renal origin: Secondary | ICD-10-CM | POA: Diagnosis not present

## 2022-06-27 DIAGNOSIS — N2581 Secondary hyperparathyroidism of renal origin: Secondary | ICD-10-CM | POA: Diagnosis not present

## 2022-06-27 DIAGNOSIS — Z992 Dependence on renal dialysis: Secondary | ICD-10-CM | POA: Diagnosis not present

## 2022-06-27 DIAGNOSIS — N186 End stage renal disease: Secondary | ICD-10-CM | POA: Diagnosis not present

## 2022-06-29 DIAGNOSIS — Z992 Dependence on renal dialysis: Secondary | ICD-10-CM | POA: Diagnosis not present

## 2022-06-29 DIAGNOSIS — N186 End stage renal disease: Secondary | ICD-10-CM | POA: Diagnosis not present

## 2022-06-29 DIAGNOSIS — N2581 Secondary hyperparathyroidism of renal origin: Secondary | ICD-10-CM | POA: Diagnosis not present

## 2022-06-30 DIAGNOSIS — Z992 Dependence on renal dialysis: Secondary | ICD-10-CM | POA: Diagnosis not present

## 2022-06-30 DIAGNOSIS — N186 End stage renal disease: Secondary | ICD-10-CM | POA: Diagnosis not present

## 2022-06-30 DIAGNOSIS — H401123 Primary open-angle glaucoma, left eye, severe stage: Secondary | ICD-10-CM | POA: Diagnosis not present

## 2022-07-01 DIAGNOSIS — N186 End stage renal disease: Secondary | ICD-10-CM | POA: Diagnosis not present

## 2022-07-01 DIAGNOSIS — Z992 Dependence on renal dialysis: Secondary | ICD-10-CM | POA: Diagnosis not present

## 2022-07-04 DIAGNOSIS — Z992 Dependence on renal dialysis: Secondary | ICD-10-CM | POA: Diagnosis not present

## 2022-07-04 DIAGNOSIS — N186 End stage renal disease: Secondary | ICD-10-CM | POA: Diagnosis not present

## 2022-07-06 DIAGNOSIS — Z992 Dependence on renal dialysis: Secondary | ICD-10-CM | POA: Diagnosis not present

## 2022-07-06 DIAGNOSIS — N186 End stage renal disease: Secondary | ICD-10-CM | POA: Diagnosis not present

## 2022-07-08 DIAGNOSIS — Z992 Dependence on renal dialysis: Secondary | ICD-10-CM | POA: Diagnosis not present

## 2022-07-08 DIAGNOSIS — N186 End stage renal disease: Secondary | ICD-10-CM | POA: Diagnosis not present

## 2022-07-11 DIAGNOSIS — N186 End stage renal disease: Secondary | ICD-10-CM | POA: Diagnosis not present

## 2022-07-11 DIAGNOSIS — Z992 Dependence on renal dialysis: Secondary | ICD-10-CM | POA: Diagnosis not present

## 2022-07-13 DIAGNOSIS — Z992 Dependence on renal dialysis: Secondary | ICD-10-CM | POA: Diagnosis not present

## 2022-07-13 DIAGNOSIS — N186 End stage renal disease: Secondary | ICD-10-CM | POA: Diagnosis not present

## 2022-07-15 DIAGNOSIS — Z992 Dependence on renal dialysis: Secondary | ICD-10-CM | POA: Diagnosis not present

## 2022-07-15 DIAGNOSIS — N186 End stage renal disease: Secondary | ICD-10-CM | POA: Diagnosis not present

## 2022-07-17 ENCOUNTER — Other Ambulatory Visit: Payer: Self-pay | Admitting: Family Medicine

## 2022-07-17 DIAGNOSIS — F329 Major depressive disorder, single episode, unspecified: Secondary | ICD-10-CM

## 2022-07-18 DIAGNOSIS — N186 End stage renal disease: Secondary | ICD-10-CM | POA: Diagnosis not present

## 2022-07-18 DIAGNOSIS — Z992 Dependence on renal dialysis: Secondary | ICD-10-CM | POA: Diagnosis not present

## 2022-07-19 DIAGNOSIS — N186 End stage renal disease: Secondary | ICD-10-CM | POA: Diagnosis not present

## 2022-07-19 DIAGNOSIS — Z992 Dependence on renal dialysis: Secondary | ICD-10-CM | POA: Diagnosis not present

## 2022-07-20 DIAGNOSIS — Z992 Dependence on renal dialysis: Secondary | ICD-10-CM | POA: Diagnosis not present

## 2022-07-20 DIAGNOSIS — N186 End stage renal disease: Secondary | ICD-10-CM | POA: Diagnosis not present

## 2022-07-22 ENCOUNTER — Other Ambulatory Visit: Payer: Self-pay | Admitting: Family Medicine

## 2022-07-22 DIAGNOSIS — N186 End stage renal disease: Secondary | ICD-10-CM | POA: Diagnosis not present

## 2022-07-22 DIAGNOSIS — Z992 Dependence on renal dialysis: Secondary | ICD-10-CM | POA: Diagnosis not present

## 2022-07-22 NOTE — Telephone Encounter (Signed)
Requested medications are due for refill today.  yes  Requested medications are on the active medications list.  yes  Last refill. 04/26/2022 #90 0 rf  Future visit scheduled.   no  Notes to clinic.  Abnormal labs.    Requested Prescriptions  Pending Prescriptions Disp Refills   torsemide (DEMADEX) 100 MG tablet [Pharmacy Med Name: TORSEMIDE 100 MG TABLET] 90 tablet 0    Sig: TAKE 1 TABLET BY MOUTH EVERY DAY     Cardiovascular:  Diuretics - Loop Failed - 07/22/2022  2:16 AM      Failed - K in normal range and within 180 days    Potassium  Date Value Ref Range Status  03/09/2022 3.4 (L) 3.5 - 5.1 mmol/L Final  05/15/2013 4.0 3.5 - 5.1 mmol/L Final   Potassium [facility] vascular lab)  Date Value Ref Range Status  01/15/2020 3.7 3.5 - 5.1 Final    Comment:    Performed at [facility], [address] [place], [place] [address]         Failed - Cr in normal range and within 180 days    Creatinine  Date Value Ref Range Status  05/15/2013 2.27 (H) 0.60 - 1.30 mg/dL Final   Creatinine, Ser  Date Value Ref Range Status  03/09/2022 3.15 (H) 0.44 - 1.00 mg/dL Final         Failed - Mg Level in normal range and within 180 days    Magnesium  Date Value Ref Range Status  07/13/2020 2.1 1.7 - 2.4 mg/dL Final    Comment:    Performed at [facility], [address] [place], [place] [address]         Failed - Last BP in normal range    BP Readings from Last 1 Encounters:  06/09/22 (!) 170/66         Passed - Ca in normal range and within 180 days    Calcium  Date Value Ref Range Status  03/09/2022 8.9 8.9 - 10.3 mg/dL Final   Calcium, Total  Date Value Ref Range Status  05/15/2013 8.0 (L) 8.5 - 10.1 mg/dL Final   Calcium, Ion  Date Value Ref Range Status  11/13/2019 1.35 1.15 - 1.40 mmol/L Final         Passed - Na in normal range and within 180 days    Sodium  Date Value Ref Range Status  03/09/2022 142 135 - 145 mmol/L Final  09/21/2021 139  134 - 144 mmol/L Final  05/15/2013 132 (L) 136 - 145 mmol/L Final         Passed - Cl in normal range and within 180 days    Chloride  Date Value Ref Range Status  03/09/2022 106 98 - 111 mmol/L Final  05/15/2013 99 98 - 107 mmol/L Final         Passed - Valid encounter within last 6 months    Recent Outpatient Visits           2 months ago Primary osteoarthritis of both [place] Simmons-Robinson, [place], MD   5 months ago Essential hypertension   [place], Riki Sheer, MD   10 months ago Encounter for [employer] annual wellness exam   [facility] Eulas Post, MD   11 months ago [place] Mikey Kirschner, PA-C   1 year ago Type 2 diabetes mellitus with  hyperlipidemia [facility])   [place] Eulas Post, MD

## 2022-07-25 DIAGNOSIS — N186 End stage renal disease: Secondary | ICD-10-CM | POA: Diagnosis not present

## 2022-07-25 DIAGNOSIS — Z992 Dependence on renal dialysis: Secondary | ICD-10-CM | POA: Diagnosis not present

## 2022-07-27 DIAGNOSIS — Z992 Dependence on renal dialysis: Secondary | ICD-10-CM | POA: Diagnosis not present

## 2022-07-27 DIAGNOSIS — N186 End stage renal disease: Secondary | ICD-10-CM | POA: Diagnosis not present

## 2022-07-29 DIAGNOSIS — N186 End stage renal disease: Secondary | ICD-10-CM | POA: Diagnosis not present

## 2022-07-29 DIAGNOSIS — Z992 Dependence on renal dialysis: Secondary | ICD-10-CM | POA: Diagnosis not present

## 2022-07-31 ENCOUNTER — Other Ambulatory Visit: Payer: Self-pay | Admitting: Family Medicine

## 2022-07-31 DIAGNOSIS — N186 End stage renal disease: Secondary | ICD-10-CM | POA: Diagnosis not present

## 2022-07-31 DIAGNOSIS — Z992 Dependence on renal dialysis: Secondary | ICD-10-CM | POA: Diagnosis not present

## 2022-08-01 DIAGNOSIS — Z992 Dependence on renal dialysis: Secondary | ICD-10-CM | POA: Diagnosis not present

## 2022-08-01 DIAGNOSIS — N186 End stage renal disease: Secondary | ICD-10-CM | POA: Diagnosis not present

## 2022-08-03 DIAGNOSIS — N186 End stage renal disease: Secondary | ICD-10-CM | POA: Diagnosis not present

## 2022-08-03 DIAGNOSIS — Z992 Dependence on renal dialysis: Secondary | ICD-10-CM | POA: Diagnosis not present

## 2022-08-05 DIAGNOSIS — Z992 Dependence on renal dialysis: Secondary | ICD-10-CM | POA: Diagnosis not present

## 2022-08-05 DIAGNOSIS — N186 End stage renal disease: Secondary | ICD-10-CM | POA: Diagnosis not present

## 2022-08-08 DIAGNOSIS — Z992 Dependence on renal dialysis: Secondary | ICD-10-CM | POA: Diagnosis not present

## 2022-08-08 DIAGNOSIS — N186 End stage renal disease: Secondary | ICD-10-CM | POA: Diagnosis not present

## 2022-08-10 DIAGNOSIS — N186 End stage renal disease: Secondary | ICD-10-CM | POA: Diagnosis not present

## 2022-08-10 DIAGNOSIS — Z992 Dependence on renal dialysis: Secondary | ICD-10-CM | POA: Diagnosis not present

## 2022-08-12 DIAGNOSIS — N186 End stage renal disease: Secondary | ICD-10-CM | POA: Diagnosis not present

## 2022-08-12 DIAGNOSIS — Z992 Dependence on renal dialysis: Secondary | ICD-10-CM | POA: Diagnosis not present

## 2022-08-15 DIAGNOSIS — N186 End stage renal disease: Secondary | ICD-10-CM | POA: Diagnosis not present

## 2022-08-15 DIAGNOSIS — Z992 Dependence on renal dialysis: Secondary | ICD-10-CM | POA: Diagnosis not present

## 2022-08-15 DIAGNOSIS — E119 Type 2 diabetes mellitus without complications: Secondary | ICD-10-CM | POA: Diagnosis not present

## 2022-08-17 DIAGNOSIS — Z992 Dependence on renal dialysis: Secondary | ICD-10-CM | POA: Diagnosis not present

## 2022-08-17 DIAGNOSIS — N186 End stage renal disease: Secondary | ICD-10-CM | POA: Diagnosis not present

## 2022-08-19 DIAGNOSIS — N186 End stage renal disease: Secondary | ICD-10-CM | POA: Diagnosis not present

## 2022-08-19 DIAGNOSIS — Z992 Dependence on renal dialysis: Secondary | ICD-10-CM | POA: Diagnosis not present

## 2022-08-22 DIAGNOSIS — N186 End stage renal disease: Secondary | ICD-10-CM | POA: Diagnosis not present

## 2022-08-22 DIAGNOSIS — Z992 Dependence on renal dialysis: Secondary | ICD-10-CM | POA: Diagnosis not present

## 2022-08-24 DIAGNOSIS — Z992 Dependence on renal dialysis: Secondary | ICD-10-CM | POA: Diagnosis not present

## 2022-08-24 DIAGNOSIS — N186 End stage renal disease: Secondary | ICD-10-CM | POA: Diagnosis not present

## 2022-08-26 DIAGNOSIS — N186 End stage renal disease: Secondary | ICD-10-CM | POA: Diagnosis not present

## 2022-08-26 DIAGNOSIS — Z992 Dependence on renal dialysis: Secondary | ICD-10-CM | POA: Diagnosis not present

## 2022-08-29 ENCOUNTER — Other Ambulatory Visit: Payer: Self-pay | Admitting: Family Medicine

## 2022-08-29 DIAGNOSIS — Z992 Dependence on renal dialysis: Secondary | ICD-10-CM | POA: Diagnosis not present

## 2022-08-29 DIAGNOSIS — J309 Allergic rhinitis, unspecified: Secondary | ICD-10-CM

## 2022-08-29 DIAGNOSIS — N186 End stage renal disease: Secondary | ICD-10-CM | POA: Diagnosis not present

## 2022-08-29 NOTE — Telephone Encounter (Signed)
Medication Refill - Medication: loratadine (CLARITIN) 10 MG tablet   Has the patient contacted their pharmacy? Yes.     Preferred Pharmacy (with phone number or street name):  [id number] [place], [place] - [address] Phone: [phone]  Fax: [phone]     Has the patient been seen for an appointment in the last year OR does the patient have an upcoming appointment? Yes.    Please assist patient further

## 2022-08-30 DIAGNOSIS — Z992 Dependence on renal dialysis: Secondary | ICD-10-CM | POA: Diagnosis not present

## 2022-08-30 DIAGNOSIS — N186 End stage renal disease: Secondary | ICD-10-CM | POA: Diagnosis not present

## 2022-08-30 MED ORDER — LORATADINE 10 MG PO TABS: 10.0000 mg | ORAL_TABLET | Freq: Every day | ORAL | 1 refills | Status: DC

## 2022-08-30 NOTE — Telephone Encounter (Signed)
Requested Prescriptions  Pending Prescriptions Disp Refills   loratadine (CLARITIN) 10 MG tablet 90 tablet 1    Sig: Take 1 tablet (10 mg total) by mouth daily.     Ear, Nose, and Throat:  Antihistamines 2 Failed - 08/29/2022 10:06 AM      Failed - Cr in normal range and within 360 days    Creatinine  Date Value Ref Range Status  05/15/2013 2.27 (H) 0.60 - 1.30 mg/dL Final   Creatinine, Ser  Date Value Ref Range Status  03/09/2022 3.15 (H) 0.44 - 1.00 mg/dL Final         Passed - Valid encounter within last 12 months    Recent Outpatient Visits           3 months ago Primary osteoarthritis of both knees   [place] [facility] Simmons-Robinson, [place], MD   7 months ago Essential hypertension   [place] [facility] Oliver, [place], MD   11 months ago Encounter for [employer] annual wellness exam   [facility] Bosie Clos, MD   1 year ago Hoarseness   [place] [facility] Alfredia Ferguson, PA-C   1 year ago Type 2 diabetes mellitus with hyperlipidemia [facility])   [place] [facility] Bosie Clos, MD

## 2022-08-31 DIAGNOSIS — N186 End stage renal disease: Secondary | ICD-10-CM | POA: Diagnosis not present

## 2022-08-31 DIAGNOSIS — Z992 Dependence on renal dialysis: Secondary | ICD-10-CM | POA: Diagnosis not present

## 2022-09-02 DIAGNOSIS — N186 End stage renal disease: Secondary | ICD-10-CM | POA: Diagnosis not present

## 2022-09-02 DIAGNOSIS — Z992 Dependence on renal dialysis: Secondary | ICD-10-CM | POA: Diagnosis not present

## 2022-09-05 DIAGNOSIS — N186 End stage renal disease: Secondary | ICD-10-CM | POA: Diagnosis not present

## 2022-09-05 DIAGNOSIS — Z992 Dependence on renal dialysis: Secondary | ICD-10-CM | POA: Diagnosis not present

## 2022-09-07 DIAGNOSIS — N186 End stage renal disease: Secondary | ICD-10-CM | POA: Diagnosis not present

## 2022-09-07 DIAGNOSIS — Z992 Dependence on renal dialysis: Secondary | ICD-10-CM | POA: Diagnosis not present

## 2022-09-09 DIAGNOSIS — N186 End stage renal disease: Secondary | ICD-10-CM | POA: Diagnosis not present

## 2022-09-09 DIAGNOSIS — Z992 Dependence on renal dialysis: Secondary | ICD-10-CM | POA: Diagnosis not present

## 2022-09-12 DIAGNOSIS — Z992 Dependence on renal dialysis: Secondary | ICD-10-CM | POA: Diagnosis not present

## 2022-09-12 DIAGNOSIS — N186 End stage renal disease: Secondary | ICD-10-CM | POA: Diagnosis not present

## 2022-09-14 DIAGNOSIS — N186 End stage renal disease: Secondary | ICD-10-CM | POA: Diagnosis not present

## 2022-09-14 DIAGNOSIS — Z992 Dependence on renal dialysis: Secondary | ICD-10-CM | POA: Diagnosis not present

## 2022-09-16 DIAGNOSIS — N186 End stage renal disease: Secondary | ICD-10-CM | POA: Diagnosis not present

## 2022-09-16 DIAGNOSIS — Z992 Dependence on renal dialysis: Secondary | ICD-10-CM | POA: Diagnosis not present

## 2022-09-19 DIAGNOSIS — Z992 Dependence on renal dialysis: Secondary | ICD-10-CM | POA: Diagnosis not present

## 2022-09-19 DIAGNOSIS — N186 End stage renal disease: Secondary | ICD-10-CM | POA: Diagnosis not present

## 2022-09-21 DIAGNOSIS — N186 End stage renal disease: Secondary | ICD-10-CM | POA: Diagnosis not present

## 2022-09-21 DIAGNOSIS — Z992 Dependence on renal dialysis: Secondary | ICD-10-CM | POA: Diagnosis not present

## 2022-09-23 DIAGNOSIS — Z992 Dependence on renal dialysis: Secondary | ICD-10-CM | POA: Diagnosis not present

## 2022-09-23 DIAGNOSIS — N186 End stage renal disease: Secondary | ICD-10-CM | POA: Diagnosis not present

## 2022-09-26 DIAGNOSIS — N186 End stage renal disease: Secondary | ICD-10-CM | POA: Diagnosis not present

## 2022-09-26 DIAGNOSIS — Z992 Dependence on renal dialysis: Secondary | ICD-10-CM | POA: Diagnosis not present

## 2022-09-28 DIAGNOSIS — Z992 Dependence on renal dialysis: Secondary | ICD-10-CM | POA: Diagnosis not present

## 2022-09-28 DIAGNOSIS — N186 End stage renal disease: Secondary | ICD-10-CM | POA: Diagnosis not present

## 2022-09-29 DIAGNOSIS — T82838A Hemorrhage of vascular prosthetic devices, implants and grafts, initial encounter: Secondary | ICD-10-CM | POA: Diagnosis not present

## 2022-09-29 DIAGNOSIS — T82898A Other specified complication of vascular prosthetic devices, implants and grafts, initial encounter: Secondary | ICD-10-CM | POA: Diagnosis not present

## 2022-09-29 DIAGNOSIS — T82858A Stenosis of vascular prosthetic devices, implants and grafts, initial encounter: Secondary | ICD-10-CM | POA: Diagnosis not present

## 2022-09-29 DIAGNOSIS — I871 Compression of vein: Secondary | ICD-10-CM | POA: Diagnosis not present

## 2022-09-30 DIAGNOSIS — N186 End stage renal disease: Secondary | ICD-10-CM | POA: Diagnosis not present

## 2022-09-30 DIAGNOSIS — Z992 Dependence on renal dialysis: Secondary | ICD-10-CM | POA: Diagnosis not present

## 2022-10-03 DIAGNOSIS — Z992 Dependence on renal dialysis: Secondary | ICD-10-CM | POA: Diagnosis not present

## 2022-10-03 DIAGNOSIS — N186 End stage renal disease: Secondary | ICD-10-CM | POA: Diagnosis not present

## 2022-10-05 ENCOUNTER — Encounter: Payer: Self-pay | Admitting: Internal Medicine

## 2022-10-05 DIAGNOSIS — N186 End stage renal disease: Secondary | ICD-10-CM | POA: Diagnosis not present

## 2022-10-05 DIAGNOSIS — Z992 Dependence on renal dialysis: Secondary | ICD-10-CM | POA: Diagnosis not present

## 2022-10-08 DIAGNOSIS — Z992 Dependence on renal dialysis: Secondary | ICD-10-CM | POA: Diagnosis not present

## 2022-10-08 DIAGNOSIS — N186 End stage renal disease: Secondary | ICD-10-CM | POA: Diagnosis not present

## 2022-10-10 DIAGNOSIS — Z992 Dependence on renal dialysis: Secondary | ICD-10-CM | POA: Diagnosis not present

## 2022-10-10 DIAGNOSIS — N186 End stage renal disease: Secondary | ICD-10-CM | POA: Diagnosis not present

## 2022-10-12 DIAGNOSIS — Z992 Dependence on renal dialysis: Secondary | ICD-10-CM | POA: Diagnosis not present

## 2022-10-12 DIAGNOSIS — N186 End stage renal disease: Secondary | ICD-10-CM | POA: Diagnosis not present

## 2022-10-14 DIAGNOSIS — Z992 Dependence on renal dialysis: Secondary | ICD-10-CM | POA: Diagnosis not present

## 2022-10-14 DIAGNOSIS — N186 End stage renal disease: Secondary | ICD-10-CM | POA: Diagnosis not present

## 2022-10-17 DIAGNOSIS — Z992 Dependence on renal dialysis: Secondary | ICD-10-CM | POA: Diagnosis not present

## 2022-10-17 DIAGNOSIS — N186 End stage renal disease: Secondary | ICD-10-CM | POA: Diagnosis not present

## 2022-10-19 DIAGNOSIS — Z992 Dependence on renal dialysis: Secondary | ICD-10-CM | POA: Diagnosis not present

## 2022-10-19 DIAGNOSIS — N186 End stage renal disease: Secondary | ICD-10-CM | POA: Diagnosis not present

## 2022-10-21 DIAGNOSIS — N186 End stage renal disease: Secondary | ICD-10-CM | POA: Diagnosis not present

## 2022-10-21 DIAGNOSIS — Z992 Dependence on renal dialysis: Secondary | ICD-10-CM | POA: Diagnosis not present

## 2022-10-24 DIAGNOSIS — N186 End stage renal disease: Secondary | ICD-10-CM | POA: Diagnosis not present

## 2022-10-24 DIAGNOSIS — Z992 Dependence on renal dialysis: Secondary | ICD-10-CM | POA: Diagnosis not present

## 2022-10-26 DIAGNOSIS — Z992 Dependence on renal dialysis: Secondary | ICD-10-CM | POA: Diagnosis not present

## 2022-10-26 DIAGNOSIS — N186 End stage renal disease: Secondary | ICD-10-CM | POA: Diagnosis not present

## 2022-10-27 ENCOUNTER — Encounter: Payer: Self-pay | Admitting: Internal Medicine

## 2022-10-28 DIAGNOSIS — N186 End stage renal disease: Secondary | ICD-10-CM | POA: Diagnosis not present

## 2022-10-28 DIAGNOSIS — Z992 Dependence on renal dialysis: Secondary | ICD-10-CM | POA: Diagnosis not present

## 2022-10-30 DIAGNOSIS — Z992 Dependence on renal dialysis: Secondary | ICD-10-CM | POA: Diagnosis not present

## 2022-10-30 DIAGNOSIS — N186 End stage renal disease: Secondary | ICD-10-CM | POA: Diagnosis not present

## 2022-10-31 DIAGNOSIS — N186 End stage renal disease: Secondary | ICD-10-CM | POA: Diagnosis not present

## 2022-10-31 DIAGNOSIS — Z992 Dependence on renal dialysis: Secondary | ICD-10-CM | POA: Diagnosis not present

## 2022-11-01 ENCOUNTER — Ambulatory Visit (INDEPENDENT_AMBULATORY_CARE_PROVIDER_SITE_OTHER): Payer: 59

## 2022-11-01 VITALS — Ht 63.0 in | Wt 110.0 lb

## 2022-11-01 DIAGNOSIS — Z0000 Encounter for general adult medical examination without abnormal findings: Secondary | ICD-10-CM

## 2022-11-01 NOTE — Patient Instructions (Signed)
Lori Stanley , Thank you for taking time to come for your Medicare Wellness Visit. I appreciate your ongoing commitment to your health goals. Please review the following plan we discussed and let me know if I can assist you in the future.   These are the goals we discussed:  Goals       Chronic Care Management      CARE PLAN ENTRY (see longitudinal plan of care for additional care plan information)  Current Barriers:  Chronic Disease Management support and education needs related HTN, CHF, DM, HLD and ESRD.  Case Manager Clinical Goal(s):  Over the next 120 days, patient will: Not require hospitalization or emergent care d/t complications r/t chronic illnesses. Take all medications as prescribed. Attend all medical appointments as scheduled. Monitor blood pressure and record readings. Follow recommended safety measures to prevent fall and injuries. Over the next 45 days, patient will: Schedule routine eye exam. Schedule routine podiatry visit.    Interventions:  Inter-disciplinary care team collaboration (see longitudinal plan of care) Discussed recent BP readings. Unable to recall exact readings. Reports occasional low readings following dialysis but says most readings have been within range.  Reviewed s/sx of CHF related complications and discussed current activity tolerance. She continues to use supplemental oxygen at 2L/min during the night. Denies episodes of shortness of breath during the day. Denies chest discomfort, abdominal or lower extremity edema. Denies change/decline in activity tolerance.  Discussed plan for care management follow-up. Lori Stanley reports doing well and remains independent in the home. She required a new arteriovenous graft. The procedure was performed on 11/13/19. Reports episodes of shoulder pain but denies unrelieved pain or worsening symptoms that require immediate medical follow-up. Reports wearing her arm sleeve as advised. We thoroughly  discussed her nutrition and importance of protein intake. She feels that she is doing well with maintaining a renal diet. She does recall days when her appetite is very limited d/t fatigue following dialysis. We discussed possible need to supplement with Nepro on these days vice skipping afternoon and evening meals. She agreed to discuss with her Nephrologist. Currently feels that she is doing well and denies urgent concerns or changes with care management needs.    Patient Self Care Activities:  Self administers medications  Attends scheduled provider appointments Calls pharmacy for medication refills Performs ADL's independently Performs IADL's independently   Please see past updates related to this goal by clicking on the "Past Updates" button in the selected goal        DIET - INCREASE WATER INTAKE      Recommend to drink at least 6-8 8oz glasses of water per day.      Expensive eye drops (pt-stated)      Current Barriers:  financial  Pharmacist Clinical Goal(s): Over the next 14 days, Lori Stanley will provide the necessary supplementary documents (proof of out of pocket prescription expenditure, proof of household income) needed for medication assistance applications to CCM pharmacist.   Interventions: CCM pharmacist will investigate available programs or foundations for Lori Stanley eye drops  Patient Self Care Activities:  Gather necessary documents needed to apply for medication assistance  Initial goal documentation        Pill Burden (pt-stated)       Current Barriers:  High pill burden Limited vision abilities  Pharmacist Clinical Goal(s): Over the next 30 days, Lori Stanley will be adherent to all medications as evidenced by patient report. CCM clinical pharmacist will collaborate with prescribers to reduce medications  if medically appropriate.   Interventions: Patient educated on purpose, proper use and potential adverse effects of medications. Contact  Dr. Vivia Budge re: Ergocalciferol and labs Develop plan with Dr. Sullivan Lone for coming off of insulin DC ASA 81mg   Make sure patient can read and visualize medications appropriately  Patient Self Care Activities:  Continue to take all medications as prescribed  Initial goal documentation          This is a list of the screening recommended for you and due dates:  Health Maintenance  Topic Date Due   Zoster (Shingles) Vaccine (1 of 2) Never done   Eye exam for diabetics  10/04/2018   Complete foot exam   12/10/2021   COVID-19 Vaccine (4 - 2023-24 season) 12/31/2021   Hemoglobin A1C  03/24/2022   Flu Shot  12/01/2022   Medicare Annual Wellness Visit  11/01/2023   DTaP/Tdap/Td vaccine (2 - Tdap) 10/20/2026   Pneumonia Vaccine  Completed   DEXA scan (bone density measurement)  Completed   HPV Vaccine  Aged Out    Advanced directives: no  Conditions/risks identified: moderate falls risk  Next appointment: Follow up in one year for your annual wellness visit 11/07/2023 @ 10:15am telephone   Preventive Care 65 Years and Older, Female Preventive care refers to lifestyle choices and visits with your health care provider that can promote health and wellness. What does preventive care include? A yearly physical exam. This is also called an annual well check. Dental exams once or twice a year. Routine eye exams. Ask your health care provider how often you should have your eyes checked. Personal lifestyle choices, including: Daily care of your teeth and gums. Regular physical activity. Eating a healthy diet. Avoiding tobacco and drug use. Limiting alcohol use. Practicing safe sex. Taking low-dose aspirin every day. Taking vitamin and mineral supplements as recommended by your health care provider. What happens during an annual well check? The services and screenings done by your health care provider during your annual well check will depend on your age, overall health, lifestyle  risk factors, and family history of disease. Counseling  Your health care provider may ask you questions about your: Alcohol use. Tobacco use. Drug use. Emotional well-being. Home and relationship well-being. Sexual activity. Eating habits. History of falls. Memory and ability to understand (cognition). Work and work Astronomer. Reproductive health. Screening  You may have the following tests or measurements: Height, weight, and BMI. Blood pressure. Lipid and cholesterol levels. These may be checked every 5 years, or more frequently if you are over 47 years old. Skin check. Lung cancer screening. You may have this screening every year starting at age 71 if you have a 30-pack-year history of smoking and currently smoke or have quit within the past 15 years. Fecal occult blood test (FOBT) of the stool. You may have this test every year starting at age 38. Flexible sigmoidoscopy or colonoscopy. You may have a sigmoidoscopy every 5 years or a colonoscopy every 10 years starting at age 54. Hepatitis C blood test. Hepatitis B blood test. Sexually transmitted disease (STD) testing. Diabetes screening. This is done by checking your blood sugar (glucose) after you have not eaten for a while (fasting). You may have this done every 1-3 years. Bone density scan. This is done to screen for osteoporosis. You may have this done starting at age 68. Mammogram. This may be done every 1-2 years. Talk to your health care provider about how often you should have regular mammograms. Talk  with your health care provider about your test results, treatment options, and if necessary, the need for more tests. Vaccines  Your health care provider may recommend certain vaccines, such as: Influenza vaccine. This is recommended every year. Tetanus, diphtheria, and acellular pertussis (Tdap, Td) vaccine. You may need a Td booster every 10 years. Zoster vaccine. You may need this after age 28. Pneumococcal  13-valent conjugate (PCV13) vaccine. One dose is recommended after age 68. Pneumococcal polysaccharide (PPSV23) vaccine. One dose is recommended after age 60. Talk to your health care provider about which screenings and vaccines you need and how often you need them. This information is not intended to replace advice given to you by your health care provider. Make sure you discuss any questions you have with your health care provider. Document Released: 05/15/2015 Document Revised: 01/06/2016 Document Reviewed: 02/17/2015 Elsevier Interactive Patient Education  2017 [employer].  Fall Prevention in the Home Falls can cause injuries. They can happen to people of all ages. There are many things you can do to make your home safe and to help prevent falls. What can I do on the outside of my home? Regularly fix the edges of walkways and driveways and fix any cracks. Remove anything that might make you trip as you walk through a door, such as a raised step or threshold. Trim any bushes or trees on the path to your home. Use bright outdoor lighting. Clear any walking paths of anything that might make someone trip, such as rocks or tools. Regularly check to see if handrails are loose or broken. Make sure that both sides of any steps have handrails. Any raised decks and porches should have guardrails on the edges. Have any leaves, snow, or ice cleared regularly. Use sand or salt on walking paths during winter. Clean up any spills in your garage right away. This includes oil or grease spills. What can I do in the bathroom? Use night lights. Install grab bars by the toilet and in the tub and shower. Do not use towel bars as grab bars. Use non-skid mats or decals in the tub or shower. If you need to sit down in the shower, use a plastic, non-slip stool. Keep the floor dry. Clean up any water that spills on the floor as soon as it happens. Remove soap buildup in the tub or shower regularly. Attach  bath mats securely with double-sided non-slip rug tape. Do not have throw rugs and other things on the floor that can make you trip. What can I do in the bedroom? Use night lights. Make sure that you have a light by your bed that is easy to reach. Do not use any sheets or blankets that are too big for your bed. They should not hang down onto the floor. Have a firm chair that has side arms. You can use this for support while you get dressed. Do not have throw rugs and other things on the floor that can make you trip. What can I do in the kitchen? Clean up any spills right away. Avoid walking on wet floors. Keep items that you use a lot in easy-to-reach places. If you need to reach something above you, use a strong step stool that has a grab bar. Keep electrical cords out of the way. Do not use floor polish or wax that makes floors slippery. If you must use wax, use non-skid floor wax. Do not have throw rugs and other things on the floor that can make you  trip. What can I do with my stairs? Do not leave any items on the stairs. Make sure that there are handrails on both sides of the stairs and use them. Fix handrails that are broken or loose. Make sure that handrails are as long as the stairways. Check any carpeting to make sure that it is firmly attached to the stairs. Fix any carpet that is loose or worn. Avoid having throw rugs at the top or bottom of the stairs. If you do have throw rugs, attach them to the floor with carpet tape. Make sure that you have a light switch at the top of the stairs and the bottom of the stairs. If you do not have them, ask someone to add them for you. What else can I do to help prevent falls? Wear shoes that: Do not have high heels. Have rubber bottoms. Are comfortable and fit you well. Are closed at the toe. Do not wear sandals. If you use a stepladder: Make sure that it is fully opened. Do not climb a closed stepladder. Make sure that both sides of the  stepladder are locked into place. Ask someone to hold it for you, if possible. Clearly mark and make sure that you can see: Any grab bars or handrails. First and last steps. Where the edge of each step is. Use tools that help you move around (mobility aids) if they are needed. These include: Canes. Walkers. Scooters. Crutches. Turn on the lights when you go into a dark area. Replace any light bulbs as soon as they burn out. Set up your furniture so you have a clear path. Avoid moving your furniture around. If any of your floors are uneven, fix them. If there are any pets around you, be aware of where they are. Review your medicines with your doctor. Some medicines can make you feel dizzy. This can increase your chance of falling. Ask your doctor what other things that you can do to help prevent falls. This information is not intended to replace advice given to you by your health care provider. Make sure you discuss any questions you have with your health care provider. Document Released: 02/12/2009 Document Revised: 09/24/2015 Document Reviewed: 05/23/2014 Elsevier Interactive Patient Education  2017 [employer].

## 2022-11-01 NOTE — Progress Notes (Signed)
Subjective:   Lori Stanley is a 89 y.o. female who presents for Medicare Annual (Subsequent) preventive examination.  Visit Complete: Virtual  I connected with  Lori Stanley on 11/01/22 by a audio enabled telemedicine application and verified that I am speaking with the correct person using two identifiers.  Patient Location: Home  Provider Location: Home Office  I discussed the limitations of evaluation and management by telemedicine. The patient expressed understanding and agreed to proceed.  Patient Medicare AWV questionnaire was completed by the patient on (not done); I have confirmed that all information answered by patient is correct and no changes since this date.  Review of Systems    Cardiac Risk Factors include: advanced age (>9men, >90 women);diabetes mellitus;dyslipidemia;hypertension;sedentary lifestyle    Objective:    Today's Vitals   11/01/22 0955  Weight: 110 lb (49.9 kg)  Height: 5\' 3"  (1.6 m)   Body mass index is 19.49 kg/m.     11/01/2022   10:09 AM 03/09/2022   12:16 PM 11/30/2021    7:45 AM 11/26/2021    6:40 PM 07/22/2020   12:03 PM 07/17/2020    7:00 PM 07/17/2020    8:22 AM  Advanced Directives  Does Patient Have a Medical Advance Directive? No Yes Yes No Yes Yes Yes  Type of Furniture conservator/restorer;Living will Living will  Healthcare Power of [employer] Power of [employer] Power of Attorney  Does patient want to make changes to medical advance directive?      No - Patient declined   Copy of Healthcare Power of Attorney in Chart?      No - copy requested   Would patient like information on creating a medical advance directive?    No - Patient declined  No - Patient declined No - Patient declined    Current Medications (verified) Outpatient Encounter Medications as of 11/01/2022  Medication Sig   acetaminophen (TYLENOL) 500 MG tablet Take 500-1,000 mg by mouth every 6 (six) hours as needed for mild  pain or fever.    albuterol (VENTOLIN HFA) 108 (90 Base) MCG/ACT inhaler TAKE 2 PUFFS BY MOUTH EVERY 6 HOURS AS NEEDED FOR WHEEZE OR SHORTNESS OF BREATH (Patient taking differently: Inhale 2 puffs into the lungs every 6 (six) hours as needed for wheezing or shortness of breath.)   calcitRIOL (ROCALTROL) 0.25 MCG capsule Take 0.25 mcg by mouth every Monday, Wednesday, and Friday.    carvedilol (COREG) 3.125 MG tablet Take 3.125 mg by mouth 2 (two) times daily.   fluticasone (FLONASE) 50 MCG/ACT nasal spray SPRAY 1 SPRAY INTO BOTH NOSTRILS DAILY.   loratadine (CLARITIN) 10 MG tablet Take 1 tablet (10 mg total) by mouth daily.   multivitamin (RENA-VIT) TABS tablet Take 1 tablet by mouth daily.    omeprazole (PRILOSEC) 40 MG capsule Take 1 capsule (40 mg total) by mouth daily.   OXYGEN Inhale 2 L into the lungs at bedtime.   sertraline (ZOLOFT) 25 MG tablet TAKE 1 TABLET BY MOUTH EVERY DAY   torsemide (DEMADEX) 100 MG tablet TAKE 1 TABLET BY MOUTH EVERY DAY   traZODone (DESYREL) 150 MG tablet TAKE 1 TABLET BY MOUTH EVERYDAY AT BEDTIME   No facility-administered encounter medications on file as of 11/01/2022.    Allergies (verified) Antihistamines, chlorpheniramine-type; Other; Paxil [paroxetine]; and Codeine   History: Past Medical History:  Diagnosis Date   Acute heart failure (HCC)    Acute on chronic heart failure with preserved ejection  fraction (HFpEF) (HCC) 06/05/2019   Anemia    Anemia due to stage 4 chronic kidney disease (HCC) 08/27/2019   Anxiety    CHF (congestive heart failure) (HCC)    Chronic kidney disease    stage V; end stage renal disease   Chronic kidney disease, stage 5 (HCC) 01/23/2019   Chronic low back pain    Depression    Diabetes mellitus without complication (HCC)    Essential hypertension, malignant 05/24/2013   GERD (gastroesophageal reflux disease)    Hematoma 07/17/2020   Hyperlipidemia    Hypertension    MI (myocardial infarction) (HCC)    Obesity     Proteinuria 01/23/2019   Respiratory failure (HCC) 04/09/2017   SOB (shortness of breath) 05/24/2013   Unilateral inguinal hernia with obstruction and without gangrene    Volume overload 08/30/2019   Past Surgical History:  Procedure Laterality Date   A/V FISTULAGRAM Left 01/15/2020   Procedure: A/V FISTULAGRAM;  Surgeon: Renford Dills, MD;  Location: [facility] INVASIVE CV [facility];  Service: Cardiovascular;  Laterality: Left;   ABDOMINAL HYSTERECTOMY     APPENDECTOMY     AV FISTULA PLACEMENT Left 11/13/2019   Procedure: INSERTION OF ARTERIOVENOUS (AV) GORE-TEX GRAFT ARM ( BRACHIAL AXILLARY );  Surgeon: Renford Dills, MD;  Location: [facility] ORS;  Service: Vascular;  Laterality: Left;   DIALYSIS/PERMA CATHETER INSERTION N/A 06/07/2019   Procedure: DIALYSIS/PERMA CATHETER INSERTION;  Surgeon: Renford Dills, MD;  Location: [facility];  Service: Cardiovascular;  Laterality: N/A;   DIALYSIS/PERMA CATHETER REMOVAL N/A 02/11/2020   Procedure: DIALYSIS/PERMA CATHETER REMOVAL;  Surgeon: Renford Dills, MD;  Location: [facility];  Service: Cardiovascular;  Laterality: N/A;   ESOPHAGOGASTRODUODENOSCOPY (EGD) WITH PROPOFOL N/A 11/30/2021   Procedure: ESOPHAGOGASTRODUODENOSCOPY (EGD) WITH PROPOFOL;  Surgeon: Toney Reil, MD;  Location: [facility];  Service: Gastroenterology;  Laterality: N/A;   EYE SURGERY     HERNIA REPAIR     INCISION AND DRAINAGE PERIRECTAL ABSCESS     INGUINAL HERNIA REPAIR N/A 07/12/2020   Procedure: HERNIA REPAIR INGUINAL ADULT;  Surgeon: Henrene Dodge, MD;  Location: [facility] ORS;  Service: General;  Laterality: N/A;   KNEE SURGERY     THYROID SURGERY     VAGINAL HYSTERECTOMY     Family History  Problem Relation Age of Onset   Heart attack Mother    Heart disease Sister    Cancer Sister    Social History   Socioeconomic History   Marital status: Widowed    Spouse name: Not on file   Number of children: 1   Years of education: Not on file    Highest education level: 11th grade  Occupational History   Occupation: retired  Tobacco Use   Smoking status: Former    Types: Cigarettes   Smokeless tobacco: Never   Tobacco comments:    Quit in 2015-2016  Vaping Use   Vaping Use: Never used  Substance and Sexual Activity   Alcohol use: No   Drug use: No   Sexual activity: Never  Other Topics Concern   Not on file  Social History Narrative   Lives in [place]; self; friends do chores; quit smoking many years; no alcohol. Worked in [employer].    Social Determinants of Health   Financial Resource Strain: Low Risk  (11/01/2022)   Overall Financial Resource Strain (CARDIA)    Difficulty of Paying Living Expenses: Not hard at all  Food Insecurity: No Food Insecurity (11/01/2022)  Hunger Vital Sign    Worried About Running Out of Food in the Last Year: Never true    Ran Out of Food in the Last Year: Never true  Transportation Needs: No Transportation Needs (11/01/2022)   PRAPARE - Administrator, Civil Service (Medical): No    Lack of Transportation (Non-Medical): No  Physical Activity: Inactive (11/01/2022)   Exercise Vital Sign    Days of Exercise per Week: 0 days    Minutes of Exercise per Session: 0 min  Stress: No Stress Concern Present (11/01/2022)   [employer] of Occupational Health - Occupational Stress Questionnaire    Feeling of Stress : Not at all  Social Connections: Socially Isolated (11/01/2022)   Social Connection and Isolation Panel [NHANES]    Frequency of Communication with Friends and Family: More than three times a week    Frequency of Social Gatherings with Friends and Family: Once a week    Attends Religious Services: Never    Database administrator or Organizations: No    Attends Banker Meetings: Never    Marital Status: Widowed    Tobacco Counseling Counseling given: Not Answered Tobacco comments: Quit in 2015-2016   Clinical Intake:  Pre-visit preparation completed:  Yes  Pain : No/denies pain     BMI - recorded: 19.49 Nutritional Status: BMI of 19-24  Normal Nutritional Risks: None Diabetes: Yes CBG done?: No Did pt. bring in CBG monitor from home?: No  How often do you need to have someone help you when you read instructions, pamphlets, or other written materials from your doctor or pharmacy?: 1 - Never  Interpreter Needed?: No  Comments: lives alone Information entered by :: B.Nigeria Lasseter,LPN   Activities of Daily Living    11/01/2022   10:09 AM 01/27/2022   11:02 AM  In your present state of health, do you have any difficulty performing the following activities:  Hearing? 1 0  Vision? 0 1  Comment  need new glasses  Difficulty concentrating or making decisions? 0 0  Walking or climbing stairs? 1 1  Dressing or bathing? 0 0  Doing errands, shopping? 1 1  Preparing Food and eating ? N   Using the Toilet? N   In the past six months, have you accidently leaked urine? N   Do you have problems with loss of bowel control? N   Managing your Medications? N   Managing your Finances? N   Housekeeping or managing your Housekeeping? Y     Patient Care Team: [name], MD as PCP - General (Family Medicine) Iran Ouch, MD as PCP - Cardiology (Cardiology) Linus Galas, DPM (Podiatry) Sherrie George, MD as Consulting Physician (Ophthalmology) Lamont Dowdy, MD as Consulting Physician (Internal Medicine) Marlene Bast [facility]) Earna Coder, MD as Consulting Physician (Internal Medicine)  Indicate any recent Medical Services you may have received from other than [facility] providers in the past year (date may be approximate).     Assessment:   This is a routine wellness examination for Lori Stanley.  Hearing/Vision screen Hearing Screening - Comments:: Adequate hearing mostly; low sounds miss sometimes Vision Screening - Comments:: Adequate vision w/glasses [facility]  Dietary issues and exercise activities  discussed:     Goals Addressed               This Visit's Progress     DIET - INCREASE WATER INTAKE   On track     Recommend to drink at  least 6-8 8oz glasses of water per day.      Pill Burden (pt-stated)   On track      Current Barriers:  High pill burden Limited vision abilities  Pharmacist Clinical Goal(s): Over the next 30 days, Lori Stanley will be adherent to all medications as evidenced by patient report. CCM clinical pharmacist will collaborate with prescribers to reduce medications if medically appropriate.   Interventions: Patient educated on purpose, proper use and potential adverse effects of medications. Contact Dr. Vivia Budge re: Ergocalciferol and labs Develop plan with Dr. Sullivan Lone for coming off of insulin DC ASA 81mg   Make sure patient can read and visualize medications appropriately  Patient Self Care Activities:  Continue to take all medications as prescribed  Initial goal documentation         Depression Screen    11/01/2022   10:06 AM 01/27/2022   10:59 AM 09/21/2021    9:45 AM 05/20/2020    1:48 PM 12/26/2019   11:45 AM 05/23/2019    9:33 AM 05/23/2019    9:31 AM  PHQ 2/9 Scores  PHQ - 2 Score 0 1 0 4 0 1 1  PHQ- 9 Score  5 2 4        Fall Risk    11/01/2022   10:00 AM 01/27/2022   10:59 AM 09/21/2021    9:45 AM 08/25/2020   10:09 AM 07/22/2020   10:19 AM  Fall Risk   Falls in the past year? 0 1 1 0 0  Number falls in past yr: 0 0 1    Injury with Fall? 0 0 1    Risk for fall due to : No Fall Risks Impaired balance/gait Impaired balance/gait;Impaired mobility;Impaired vision;Orthopedic patient    Follow up Education provided;Falls prevention discussed Falls evaluation completed Falls evaluation completed      MEDICARE RISK AT HOME:  Medicare Risk at Home - 11/01/22 1001     Any stairs in or around the home? Yes   4steps   If so, are there any without handrails? Yes    Home free of loose throw rugs in walkways, pet beds, electrical  cords, etc? Yes    Adequate lighting in your home to reduce risk of falls? Yes    Life alert? Yes    Use of a cane, walker or w/c? Yes   walker:legs not strong sometimes   Grab bars in the bathroom? No    Shower chair or bench in shower? No    Elevated toilet seat or a handicapped toilet? Yes             TIMED UP AND GO:  Was the test performed?  No    Cognitive Function:    01/27/2022   11:16 AM  MMSE - Mini Mental State Exam  Orientation to time 5  Orientation to Place 5  Registration 3  Attention/ Calculation 4  Recall 3  Language- name 2 objects 2  Language- repeat 1  Language- follow 3 step command 3  Language- read & follow direction 1  Write a sentence 1  Copy design 0  Total score 28        11/01/2022   10:14 AM 05/23/2019    9:38 AM 05/15/2018   11:07 AM  6CIT Screen  What Year? 0 points 0 points 0 points  What month? 0 points 0 points 0 points  What time? 0 points 0 points 0 points  Count back from 20 0 points 0 points 0  points  Months in reverse 0 points 4 points 2 points  Repeat phrase 10 points 6 points 4 points  Total Score 10 points 10 points 6 points    Immunizations Immunization History  Administered Date(s) Administered   Fluad Quad(high Dose 65+) 01/29/2019, 01/14/2020, 01/27/2021, 01/27/2022   Hepatitis B, ADULT 12/27/2019, 03/30/2020, 04/03/2020   Influenza, High Dose Seasonal PF 02/05/2015, 01/16/2016, 12/26/2016, 02/13/2018   PFIZER(Purple Top)SARS-COV-2 Vaccination 08/07/2019, 08/28/2019, 05/20/2020   PPD Test 11/29/2019, 10/30/2020   Pneumococcal Conjugate-13 02/04/2014   Pneumococcal Polysaccharide-23 04/07/2002   Td 10/19/2016    TDAP status: Up to date  Flu Vaccine status: Up to date  Pneumococcal vaccine status: Up to date  Covid-19 vaccine status: Completed vaccines  Qualifies for Shingles Vaccine? Yes   Zostavax completed No   Shingrix Completed?: No.    Education has been provided regarding the importance of this  vaccine. Patient has been advised to call insurance company to determine out of pocket expense if they have not yet received this vaccine. Advised may also receive vaccine at local pharmacy or Health Dept. Verbalized acceptance and understanding.  Screening Tests Health Maintenance  Topic Date Due   Zoster Vaccines- Shingrix (1 of 2) Never done   OPHTHALMOLOGY EXAM  10/04/2018   FOOT EXAM  12/10/2021   COVID-19 Vaccine (4 - 2023-24 season) 12/31/2021   HEMOGLOBIN A1C  03/24/2022   INFLUENZA VACCINE  12/01/2022   Medicare Annual Wellness (AWV)  11/01/2023   DTaP/Tdap/Td (2 - Tdap) 10/20/2026   Pneumonia Vaccine 70+ Years old  Completed   DEXA SCAN  Completed   HPV VACCINES  Aged Out    Health Maintenance  Health Maintenance Due  Topic Date Due   Zoster Vaccines- Shingrix (1 of 2) Never done   OPHTHALMOLOGY EXAM  10/04/2018   FOOT EXAM  12/10/2021   COVID-19 Vaccine (4 - 2023-24 season) 12/31/2021   HEMOGLOBIN A1C  03/24/2022    Colorectal cancer screening: No longer required.   Mammogram status: No longer required due to age.  Lung Cancer Screening: (Low Dose CT Chest recommended if Age 42-80 years, 20 pack-year currently smoking OR have quit w/in 15years.) does not qualify.   Lung Cancer Screening Referral: no  Additional Screening:  Hepatitis C Screening: does not qualify; Completed yes  Vision Screening: Recommended annual ophthalmology exams for early detection of glaucoma and other disorders of the eye. Is the patient up to date with their annual eye exam?  Yes  Who is the provider or what is the name of the office in which the patient attends annual eye exams? [facility] If pt is not established with a provider, would they like to be referred to a provider to establish care? No .   Dental Screening: Recommended annual dental exams for proper oral hygiene  Diabetic Foot Exam: Diabetic Foot Exam: Completed yes  Community Resource Referral / Chronic Care  Management: CRR required this visit?  No   CCM required this visit?  Appt scheduled with PCP     Plan:     I have personally reviewed and noted the following in the patient's chart:   Medical and social history Use of alcohol, tobacco or illicit drugs  Current medications and supplements including opioid prescriptions. Patient is not currently taking opioid prescriptions. Functional ability and status Nutritional status Physical activity Advanced directives List of other physicians Hospitalizations, surgeries, and ER visits in previous 12 months Vitals Screenings to include cognitive, depression, and falls Referrals and appointments  In addition, I have reviewed and discussed with patient certain preventive protocols, quality metrics, and best practice recommendations. A written personalized care plan for preventive services as well as general preventive health recommendations were provided to patient.     Sue Lush, LPN   05/07/1094   After Visit Summary: (Declined) Due to this being a telephonic visit, with patients personalized plan was offered to patient but patient Declined AVS at this time   Nurse Notes: The patient states she thinks she is doing alright. She does relay she does not have a need for oxygen at night and has only used it twice in over a year. She relays she got "winded" and needed.  She continues to go to dialysis 3xweekly and is picked up by them. She relays she does not drive and coordinates her rides to doctor and store. She relays she does not go anywhere else. She has no concerns or questions at this time.

## 2022-11-02 DIAGNOSIS — N186 End stage renal disease: Secondary | ICD-10-CM | POA: Diagnosis not present

## 2022-11-02 DIAGNOSIS — Z992 Dependence on renal dialysis: Secondary | ICD-10-CM | POA: Diagnosis not present

## 2022-11-04 DIAGNOSIS — N186 End stage renal disease: Secondary | ICD-10-CM | POA: Diagnosis not present

## 2022-11-04 DIAGNOSIS — Z992 Dependence on renal dialysis: Secondary | ICD-10-CM | POA: Diagnosis not present

## 2022-11-07 DIAGNOSIS — Z992 Dependence on renal dialysis: Secondary | ICD-10-CM | POA: Diagnosis not present

## 2022-11-07 DIAGNOSIS — N186 End stage renal disease: Secondary | ICD-10-CM | POA: Diagnosis not present

## 2022-11-09 DIAGNOSIS — Z992 Dependence on renal dialysis: Secondary | ICD-10-CM | POA: Diagnosis not present

## 2022-11-09 DIAGNOSIS — N186 End stage renal disease: Secondary | ICD-10-CM | POA: Diagnosis not present

## 2022-11-11 DIAGNOSIS — N186 End stage renal disease: Secondary | ICD-10-CM | POA: Diagnosis not present

## 2022-11-11 DIAGNOSIS — Z992 Dependence on renal dialysis: Secondary | ICD-10-CM | POA: Diagnosis not present

## 2022-11-14 DIAGNOSIS — Z992 Dependence on renal dialysis: Secondary | ICD-10-CM | POA: Diagnosis not present

## 2022-11-14 DIAGNOSIS — N186 End stage renal disease: Secondary | ICD-10-CM | POA: Diagnosis not present

## 2022-11-16 DIAGNOSIS — Z992 Dependence on renal dialysis: Secondary | ICD-10-CM | POA: Diagnosis not present

## 2022-11-16 DIAGNOSIS — N186 End stage renal disease: Secondary | ICD-10-CM | POA: Diagnosis not present

## 2022-11-18 DIAGNOSIS — N186 End stage renal disease: Secondary | ICD-10-CM | POA: Diagnosis not present

## 2022-11-18 DIAGNOSIS — Z992 Dependence on renal dialysis: Secondary | ICD-10-CM | POA: Diagnosis not present

## 2022-11-21 DIAGNOSIS — N186 End stage renal disease: Secondary | ICD-10-CM | POA: Diagnosis not present

## 2022-11-21 DIAGNOSIS — Z992 Dependence on renal dialysis: Secondary | ICD-10-CM | POA: Diagnosis not present

## 2022-11-21 DIAGNOSIS — E119 Type 2 diabetes mellitus without complications: Secondary | ICD-10-CM | POA: Diagnosis not present

## 2022-11-23 DIAGNOSIS — Z992 Dependence on renal dialysis: Secondary | ICD-10-CM | POA: Diagnosis not present

## 2022-11-23 DIAGNOSIS — N186 End stage renal disease: Secondary | ICD-10-CM | POA: Diagnosis not present

## 2022-11-25 DIAGNOSIS — Z992 Dependence on renal dialysis: Secondary | ICD-10-CM | POA: Diagnosis not present

## 2022-11-25 DIAGNOSIS — N186 End stage renal disease: Secondary | ICD-10-CM | POA: Diagnosis not present

## 2022-11-28 DIAGNOSIS — N186 End stage renal disease: Secondary | ICD-10-CM | POA: Diagnosis not present

## 2022-11-28 DIAGNOSIS — Z992 Dependence on renal dialysis: Secondary | ICD-10-CM | POA: Diagnosis not present

## 2022-11-30 DIAGNOSIS — N186 End stage renal disease: Secondary | ICD-10-CM | POA: Diagnosis not present

## 2022-11-30 DIAGNOSIS — Z992 Dependence on renal dialysis: Secondary | ICD-10-CM | POA: Diagnosis not present

## 2022-12-02 DIAGNOSIS — N186 End stage renal disease: Secondary | ICD-10-CM | POA: Diagnosis not present

## 2022-12-02 DIAGNOSIS — Z992 Dependence on renal dialysis: Secondary | ICD-10-CM | POA: Diagnosis not present

## 2022-12-03 ENCOUNTER — Other Ambulatory Visit: Payer: Self-pay | Admitting: Family Medicine

## 2022-12-03 DIAGNOSIS — F329 Major depressive disorder, single episode, unspecified: Secondary | ICD-10-CM

## 2022-12-05 DIAGNOSIS — N186 End stage renal disease: Secondary | ICD-10-CM | POA: Diagnosis not present

## 2022-12-05 DIAGNOSIS — Z992 Dependence on renal dialysis: Secondary | ICD-10-CM | POA: Diagnosis not present

## 2022-12-05 NOTE — Telephone Encounter (Signed)
Requested medications are due for refill today.  yes  Requested medications are on the active medications list.  yes  Last refill. 07/18/2022 #90 0 rf  Future visit scheduled.   yes  Notes to clinic.  Labs are expired.    Requested Prescriptions  Pending Prescriptions Disp Refills   sertraline (ZOLOFT) 25 MG tablet [Pharmacy Med Name: SERTRALINE HCL 25 MG TABLET] 90 tablet 0    Sig: TAKE 1 TABLET BY MOUTH EVERY DAY     Psychiatry:  Antidepressants - SSRI - sertraline Failed - 12/03/2022  1:14 AM      Failed - AST in normal range and within 360 days    AST  Date Value Ref Range Status  11/27/2021 15 15 - 41 U/L Final   SGOT(AST)  Date Value Ref Range Status  05/11/2013 46 (H) 15 - 37 Unit/L Final         Failed - ALT in normal range and within 360 days    ALT  Date Value Ref Range Status  11/27/2021 12 0 - 44 U/L Final   SGPT (ALT)  Date Value Ref Range Status  05/11/2013 33 12 - 78 U/L Final         Passed - Completed PHQ-2 or PHQ-9 in the last 360 days      Passed - Valid encounter within last 6 months    Recent Outpatient Visits           6 months ago Primary osteoarthritis of both knees   [facility] Simmons-Robinson, Armorel, MD   10 months ago Essential hypertension   [place] [facility] Malo, Lititz, MD   1 year ago Encounter for Medicare annual wellness exam   [facility] Bosie Clos, MD   1 year ago Hoarseness   [place] [facility] Alfredia Ferguson, PA-C   1 year ago Type 2 diabetes mellitus with hyperlipidemia [facility])   [place] [facility] Bosie Clos, MD       Future Appointments             In 1 month Simmons-Robinson, Tawanna Cooler, MD [facility]

## 2022-12-07 DIAGNOSIS — Z992 Dependence on renal dialysis: Secondary | ICD-10-CM | POA: Diagnosis not present

## 2022-12-07 DIAGNOSIS — N186 End stage renal disease: Secondary | ICD-10-CM | POA: Diagnosis not present

## 2022-12-09 DIAGNOSIS — Z992 Dependence on renal dialysis: Secondary | ICD-10-CM | POA: Diagnosis not present

## 2022-12-09 DIAGNOSIS — N186 End stage renal disease: Secondary | ICD-10-CM | POA: Diagnosis not present

## 2022-12-12 DIAGNOSIS — Z992 Dependence on renal dialysis: Secondary | ICD-10-CM | POA: Diagnosis not present

## 2022-12-12 DIAGNOSIS — N186 End stage renal disease: Secondary | ICD-10-CM | POA: Diagnosis not present

## 2022-12-14 DIAGNOSIS — N186 End stage renal disease: Secondary | ICD-10-CM | POA: Diagnosis not present

## 2022-12-14 DIAGNOSIS — Z992 Dependence on renal dialysis: Secondary | ICD-10-CM | POA: Diagnosis not present

## 2022-12-16 DIAGNOSIS — Z992 Dependence on renal dialysis: Secondary | ICD-10-CM | POA: Diagnosis not present

## 2022-12-16 DIAGNOSIS — N186 End stage renal disease: Secondary | ICD-10-CM | POA: Diagnosis not present

## 2022-12-19 DIAGNOSIS — N186 End stage renal disease: Secondary | ICD-10-CM | POA: Diagnosis not present

## 2022-12-19 DIAGNOSIS — Z992 Dependence on renal dialysis: Secondary | ICD-10-CM | POA: Diagnosis not present

## 2022-12-21 DIAGNOSIS — Z992 Dependence on renal dialysis: Secondary | ICD-10-CM | POA: Diagnosis not present

## 2022-12-21 DIAGNOSIS — N186 End stage renal disease: Secondary | ICD-10-CM | POA: Diagnosis not present

## 2022-12-23 DIAGNOSIS — Z992 Dependence on renal dialysis: Secondary | ICD-10-CM | POA: Diagnosis not present

## 2022-12-23 DIAGNOSIS — N186 End stage renal disease: Secondary | ICD-10-CM | POA: Diagnosis not present

## 2022-12-26 DIAGNOSIS — Z992 Dependence on renal dialysis: Secondary | ICD-10-CM | POA: Diagnosis not present

## 2022-12-26 DIAGNOSIS — N186 End stage renal disease: Secondary | ICD-10-CM | POA: Diagnosis not present

## 2022-12-28 DIAGNOSIS — Z992 Dependence on renal dialysis: Secondary | ICD-10-CM | POA: Diagnosis not present

## 2022-12-28 DIAGNOSIS — N186 End stage renal disease: Secondary | ICD-10-CM | POA: Diagnosis not present

## 2022-12-30 DIAGNOSIS — N186 End stage renal disease: Secondary | ICD-10-CM | POA: Diagnosis not present

## 2022-12-30 DIAGNOSIS — Z992 Dependence on renal dialysis: Secondary | ICD-10-CM | POA: Diagnosis not present

## 2022-12-31 DIAGNOSIS — Z992 Dependence on renal dialysis: Secondary | ICD-10-CM | POA: Diagnosis not present

## 2022-12-31 DIAGNOSIS — N186 End stage renal disease: Secondary | ICD-10-CM | POA: Diagnosis not present

## 2023-01-01 DIAGNOSIS — N186 End stage renal disease: Secondary | ICD-10-CM | POA: Diagnosis not present

## 2023-01-01 DIAGNOSIS — Z992 Dependence on renal dialysis: Secondary | ICD-10-CM | POA: Diagnosis not present

## 2023-01-02 DIAGNOSIS — Z992 Dependence on renal dialysis: Secondary | ICD-10-CM | POA: Diagnosis not present

## 2023-01-02 DIAGNOSIS — N186 End stage renal disease: Secondary | ICD-10-CM | POA: Diagnosis not present

## 2023-01-04 DIAGNOSIS — N186 End stage renal disease: Secondary | ICD-10-CM | POA: Diagnosis not present

## 2023-01-04 DIAGNOSIS — Z992 Dependence on renal dialysis: Secondary | ICD-10-CM | POA: Diagnosis not present

## 2023-01-05 DIAGNOSIS — M79675 Pain in left toe(s): Secondary | ICD-10-CM | POA: Diagnosis not present

## 2023-01-05 DIAGNOSIS — M79674 Pain in right toe(s): Secondary | ICD-10-CM | POA: Diagnosis not present

## 2023-01-05 DIAGNOSIS — E119 Type 2 diabetes mellitus without complications: Secondary | ICD-10-CM | POA: Diagnosis not present

## 2023-01-05 DIAGNOSIS — B351 Tinea unguium: Secondary | ICD-10-CM | POA: Diagnosis not present

## 2023-01-06 DIAGNOSIS — N186 End stage renal disease: Secondary | ICD-10-CM | POA: Diagnosis not present

## 2023-01-06 DIAGNOSIS — Z992 Dependence on renal dialysis: Secondary | ICD-10-CM | POA: Diagnosis not present

## 2023-01-09 DIAGNOSIS — Z992 Dependence on renal dialysis: Secondary | ICD-10-CM | POA: Diagnosis not present

## 2023-01-09 DIAGNOSIS — N186 End stage renal disease: Secondary | ICD-10-CM | POA: Diagnosis not present

## 2023-01-11 DIAGNOSIS — Z992 Dependence on renal dialysis: Secondary | ICD-10-CM | POA: Diagnosis not present

## 2023-01-11 DIAGNOSIS — N186 End stage renal disease: Secondary | ICD-10-CM | POA: Diagnosis not present

## 2023-01-12 ENCOUNTER — Other Ambulatory Visit: Payer: Self-pay | Admitting: Family Medicine

## 2023-01-13 DIAGNOSIS — N186 End stage renal disease: Secondary | ICD-10-CM | POA: Diagnosis not present

## 2023-01-13 DIAGNOSIS — Z992 Dependence on renal dialysis: Secondary | ICD-10-CM | POA: Diagnosis not present

## 2023-01-16 DIAGNOSIS — Z992 Dependence on renal dialysis: Secondary | ICD-10-CM | POA: Diagnosis not present

## 2023-01-16 DIAGNOSIS — N186 End stage renal disease: Secondary | ICD-10-CM | POA: Diagnosis not present

## 2023-01-17 ENCOUNTER — Encounter: Payer: 59 | Admitting: Family Medicine

## 2023-01-18 DIAGNOSIS — Z992 Dependence on renal dialysis: Secondary | ICD-10-CM | POA: Diagnosis not present

## 2023-01-18 DIAGNOSIS — N186 End stage renal disease: Secondary | ICD-10-CM | POA: Diagnosis not present

## 2023-01-20 DIAGNOSIS — Z992 Dependence on renal dialysis: Secondary | ICD-10-CM | POA: Diagnosis not present

## 2023-01-20 DIAGNOSIS — N186 End stage renal disease: Secondary | ICD-10-CM | POA: Diagnosis not present

## 2023-01-23 DIAGNOSIS — Z992 Dependence on renal dialysis: Secondary | ICD-10-CM | POA: Diagnosis not present

## 2023-01-23 DIAGNOSIS — N186 End stage renal disease: Secondary | ICD-10-CM | POA: Diagnosis not present

## 2023-01-25 DIAGNOSIS — N186 End stage renal disease: Secondary | ICD-10-CM | POA: Diagnosis not present

## 2023-01-25 DIAGNOSIS — Z992 Dependence on renal dialysis: Secondary | ICD-10-CM | POA: Diagnosis not present

## 2023-01-27 DIAGNOSIS — N186 End stage renal disease: Secondary | ICD-10-CM | POA: Diagnosis not present

## 2023-01-27 DIAGNOSIS — Z992 Dependence on renal dialysis: Secondary | ICD-10-CM | POA: Diagnosis not present

## 2023-01-30 DIAGNOSIS — N186 End stage renal disease: Secondary | ICD-10-CM | POA: Diagnosis not present

## 2023-01-30 DIAGNOSIS — Z992 Dependence on renal dialysis: Secondary | ICD-10-CM | POA: Diagnosis not present

## 2023-01-31 DIAGNOSIS — Z992 Dependence on renal dialysis: Secondary | ICD-10-CM | POA: Diagnosis not present

## 2023-01-31 DIAGNOSIS — N186 End stage renal disease: Secondary | ICD-10-CM | POA: Diagnosis not present

## 2023-02-01 DIAGNOSIS — N186 End stage renal disease: Secondary | ICD-10-CM | POA: Diagnosis not present

## 2023-02-01 DIAGNOSIS — Z992 Dependence on renal dialysis: Secondary | ICD-10-CM | POA: Diagnosis not present

## 2023-02-02 ENCOUNTER — Ambulatory Visit: Payer: 59 | Admitting: Family Medicine

## 2023-02-02 ENCOUNTER — Encounter: Payer: Self-pay | Admitting: Family Medicine

## 2023-02-02 VITALS — BP 160/58 | HR 79 | Ht 63.0 in | Wt 116.0 lb

## 2023-02-02 DIAGNOSIS — I10 Essential (primary) hypertension: Secondary | ICD-10-CM | POA: Diagnosis not present

## 2023-02-02 DIAGNOSIS — E785 Hyperlipidemia, unspecified: Secondary | ICD-10-CM | POA: Diagnosis not present

## 2023-02-02 DIAGNOSIS — E1122 Type 2 diabetes mellitus with diabetic chronic kidney disease: Secondary | ICD-10-CM | POA: Diagnosis not present

## 2023-02-02 DIAGNOSIS — Z992 Dependence on renal dialysis: Secondary | ICD-10-CM | POA: Diagnosis not present

## 2023-02-02 DIAGNOSIS — J029 Acute pharyngitis, unspecified: Secondary | ICD-10-CM

## 2023-02-02 DIAGNOSIS — N186 End stage renal disease: Secondary | ICD-10-CM

## 2023-02-02 DIAGNOSIS — J309 Allergic rhinitis, unspecified: Secondary | ICD-10-CM

## 2023-02-02 DIAGNOSIS — Z0001 Encounter for general adult medical examination with abnormal findings: Secondary | ICD-10-CM | POA: Diagnosis not present

## 2023-02-02 DIAGNOSIS — Z0000 Encounter for general adult medical examination without abnormal findings: Secondary | ICD-10-CM | POA: Diagnosis not present

## 2023-02-02 DIAGNOSIS — Z23 Encounter for immunization: Secondary | ICD-10-CM

## 2023-02-02 LAB — POCT RAPID STREP A (OFFICE): Rapid Strep A Screen: NEGATIVE

## 2023-02-02 MED ORDER — ALBUTEROL SULFATE HFA 108 (90 BASE) MCG/ACT IN AERS: 2.0000 | INHALATION_SPRAY | Freq: Four times a day (QID) | RESPIRATORY_TRACT | 3 refills | Status: DC | PRN

## 2023-02-02 NOTE — Assessment & Plan Note (Signed)
Annual wellness visit completed today including all of the following: -Reviewed patient's family medical history -Reviewed and updated patient's list of medical providers -Completed assessment of cognitive impairment -Completed assessment of patient's functional ability -Provided patient with recommendations for health screening services as well as vaccines Health risk assessment completed and reviewed -Discussed recommendations for well-balanced diet in addition to 150 minutes of physical activity per week  

## 2023-02-02 NOTE — Assessment & Plan Note (Signed)
Chronic  Manageed with HD, followed by nephrology

## 2023-02-02 NOTE — Progress Notes (Signed)
Annual Wellness Visit     Patient: Lori Stanley, Female    DOB: 1932-02-24, 89 y.o.   MRN: [id number] Visit Date: 02/02/2023  Today's Provider: [name], MD   Chief Complaint  Patient presents with   Annual Exam    Would like to have throat checked, has been sore for about 1 week. No prior treatment   Subjective    Lori Stanley is a 89 y.o. female who presents today for her Annual Wellness Visit.  She reports consuming a general diet.  Exercise is limited by orthopedic condition(s): leg pain. She generally feels fairly well.  She reports sleeping well.    She does not have additional problems to discuss today.   Discussed the use of AI scribe software for clinical note transcription with the patient, who gave verbal consent to proceed.  History of Present Illness   The patient, with a history of heart disease and diabetes, presented for an annual wellness visit. She reported a general diet and limited physical activity due to weakness in the arms and legs. The patient described her overall health as "very well," but noted a persistent issue with her shoulder and arm, which she described as "shrinking."  The patient reported a recent sore throat, which was not severe and not associated with coughing or ear pain. She had been managing the discomfort with warm salt water. The patient also reported occasional shortness of breath, particularly after prolonged walking. She had previously been prescribed an inhaler, which she requested a refill for, stating it helped manage her symptoms.  The patient also reported occasional difficulty sleeping, feeling tired with little energy less than half of the time, and sometimes feeling hopeless. She is currently on sertraline for depression. She denied any thoughts of self-harm or suicide.  The patient reported a fall at the beginning of the year, but denied any injuries from the fall. She uses a walker for mobility  due to joint pain, which she reported worsens with exercise. She also reported some difficulty with hearing, particularly when people speak softly or mumble, and vision problems, for which she wears glasses.  The patient reported a recent lab work done, but did not specify the results. She also mentioned having an oxygen tank at home, which she uses about once or twice a year at two liters. She also reported a history of a heart attack and is currently on carvedilol. She has been prescribed roclatin drops for glaucoma, but reported difficulty affording the medication.  The patient lives alone and reported some difficulty running errands due to transportation issues. She also reported some difficulty with dressing and bathing due to joint pain, but is able to manage these tasks independently. She denied any difficulty with concentration or memory, but did report some weight loss, which she attributed to a decrease in appetite.      Medications: Outpatient Medications Prior to Visit  Medication Sig   acetaminophen (TYLENOL) 500 MG tablet Take 500-1,000 mg by mouth every 6 (six) hours as needed for mild pain or fever.    calcitRIOL (ROCALTROL) 0.25 MCG capsule Take 0.25 mcg by mouth every Monday, Wednesday, and Friday.    carvedilol (COREG) 3.125 MG tablet Take 3.125 mg by mouth 2 (two) times daily.   fluticasone (FLONASE) 50 MCG/ACT nasal spray SPRAY 1 SPRAY INTO BOTH NOSTRILS DAILY.   loratadine (CLARITIN) 10 MG tablet Take 1 tablet (10 mg total) by mouth daily.   multivitamin (RENA-VIT) TABS tablet  Take 1 tablet by mouth daily.    omeprazole (PRILOSEC) 40 MG capsule Take 1 capsule (40 mg total) by mouth daily.   OXYGEN Inhale 2 L into the lungs at bedtime.   sertraline (ZOLOFT) 25 MG tablet TAKE 1 TABLET BY MOUTH EVERY DAY   torsemide (DEMADEX) 100 MG tablet TAKE 1 TABLET BY MOUTH EVERY DAY   traZODone (DESYREL) 150 MG tablet TAKE 1 TABLET BY MOUTH EVERYDAY AT BEDTIME   [DISCONTINUED] albuterol  (VENTOLIN HFA) 108 (90 Base) MCG/ACT inhaler TAKE 2 PUFFS BY MOUTH EVERY 6 HOURS AS NEEDED FOR WHEEZE OR SHORTNESS OF BREATH (Patient taking differently: Inhale 2 puffs into the lungs every 6 (six) hours as needed for wheezing or shortness of breath.)   No facility-administered medications prior to visit.    Allergies  Allergen Reactions   Antihistamines, Chlorpheniramine-Type Other (See Comments)    Unknown reaction.   Other Other (See Comments)   Paxil [Paroxetine]     "makes her feel funny" not in a good way   Codeine Rash and Other (See Comments)    Mouth sore    Patient Care Team: [name], MD as PCP - General (Family Medicine) Iran Ouch, MD as PCP - Cardiology (Cardiology) Linus Galas, DPM (Podiatry) Sherrie George, MD as Consulting Physician (Ophthalmology) Lamont Dowdy, MD as Consulting Physician (Internal Medicine) Marlene Bast [facility]) Earna Coder, MD as Consulting Physician (Internal Medicine)  Review of Systems  Last CBC Lab Results  Component Value Date   WBC 5.9 03/09/2022   HGB 12.0 03/09/2022   HCT 40.1 03/09/2022   MCV 88.5 03/09/2022   MCH 26.5 03/09/2022   RDW 19.3 (H) 03/09/2022   PLT 215 03/09/2022   Last metabolic panel Lab Results  Component Value Date   GLUCOSE 142 (H) 03/09/2022   NA 142 03/09/2022   K 3.4 (L) 03/09/2022   CL 106 03/09/2022   CO2 26 03/09/2022   BUN 28 (H) 03/09/2022   CREATININE 3.15 (H) 03/09/2022   GFRNONAA 14 (L) 03/09/2022   CALCIUM 8.9 03/09/2022   PHOS 1.7 (L) 07/20/2020   PROT 5.5 (L) 11/27/2021   ALBUMIN 2.8 (L) 11/27/2021   LABGLOB 2.4 09/21/2021   AGRATIO 1.7 09/21/2021   BILITOT 0.5 11/27/2021   ALKPHOS 92 11/27/2021   AST 15 11/27/2021   ALT 12 11/27/2021   ANIONGAP 10 03/09/2022   Last hemoglobin A1c Lab Results  Component Value Date   HGBA1C 5.0 09/21/2021   Last thyroid functions Lab Results  Component Value Date   TSH 2.840 09/21/2021          Objective    Vitals: BP (!) 160/58 (BP Location: Left Arm, Patient Position: Sitting, Cuff Size: Normal)   Pulse 79   Ht 5\' 3"  (1.6 m)   Wt 116 lb (52.6 kg)   SpO2 100%   BMI 20.55 kg/m   BP Readings from Last 3 Encounters:  02/02/23 (!) 160/58  06/09/22 (!) 170/66  05/10/22 (!) 155/67   Wt Readings from Last 3 Encounters:  02/02/23 116 lb (52.6 kg)  11/01/22 110 lb (49.9 kg)  06/09/22 110 lb 8 oz (50.1 kg)        Physical Exam Vitals reviewed.  Constitutional:      General: She is not in acute distress.    Appearance: Normal appearance. She is not ill-appearing, toxic-appearing or diaphoretic.  HENT:     Head: Normocephalic and atraumatic.     Right Ear: Tympanic membrane and  external ear normal. There is no impacted cerumen.     Left Ear: Tympanic membrane and external ear normal. There is no impacted cerumen.     Nose: Nose normal.     Mouth/Throat:     Pharynx: Oropharynx is clear.  Eyes:     General: No scleral icterus.    Extraocular Movements: Extraocular movements intact.     Conjunctiva/sclera: Conjunctivae normal.     Pupils: Pupils are equal, round, and reactive to light.  Cardiovascular:     Rate and Rhythm: Normal rate and regular rhythm.     Pulses: Normal pulses.     Heart sounds: Normal heart sounds. No murmur heard.    No friction rub. No gallop.  Pulmonary:     Effort: Pulmonary effort is normal. No respiratory distress.     Breath sounds: Normal breath sounds. No wheezing, rhonchi or rales.  Abdominal:     General: Bowel sounds are normal. There is no distension.     Palpations: Abdomen is soft. There is no mass.     Tenderness: There is no abdominal tenderness. There is no guarding.  Musculoskeletal:        General: Swelling, tenderness and deformity present.     Cervical back: Normal range of motion and neck supple. No rigidity.     Right lower leg: No edema.     Left lower leg: No edema.  Lymphadenopathy:     Cervical: No  cervical adenopathy.  Skin:    General: Skin is warm.     Capillary Refill: Capillary refill takes less than 2 seconds.     Findings: No erythema or rash.  Neurological:     General: No focal deficit present.     Mental Status: She is alert and oriented to person, place, and time.     Motor: No weakness.     Gait: Gait normal.  Psychiatric:        Mood and Affect: Mood normal.        Behavior: Behavior normal.      Most recent functional status assessment:    02/02/2023    1:44 PM  In your present state of health, do you have any difficulty performing the following activities:  Hearing? 0  Vision? 1  Difficulty concentrating or making decisions? 0  Walking or climbing stairs? 1  Dressing or bathing? 0  Doing errands, shopping? 0  Comment relies on transportation because she cannot drive   Most recent fall risk assessment:    02/02/2023    1:43 PM  Fall Risk   Falls in the past year? 1  Number falls in past yr: 0  Injury with Fall? 0  Risk for fall due to : Impaired mobility  Follow up Falls prevention discussed    Most recent depression screenings:    02/02/2023    1:40 PM 11/01/2022   10:06 AM  PHQ 2/9 Scores  PHQ - 2 Score 1 0  PHQ- 9 Score 3    Most recent cognitive screening:    02/02/2023    1:37 PM  6CIT Screen  What Year? 0 points  What month? 0 points  What time? 0 points  Count back from 20 0 points  Months in reverse 2 points  Repeat phrase 4 points  Total Score 6 points   Most recent Audit-C alcohol use screening    02/02/2023    1:40 PM  Alcohol Use Disorder Test (AUDIT)  1. How often do you have a  drink containing alcohol? 0  2. How many drinks containing alcohol do you have on a typical day when you are drinking? 0  3. How often do you have six or more drinks on one occasion? 0  AUDIT-C Score 0   A score of 3 or more in women, and 4 or more in men indicates increased risk for alcohol abuse, EXCEPT if all of the points are from question 1    Results for orders placed or performed in visit on 02/02/23  POCT rapid strep A  Result Value Ref Range   Rapid Strep A Screen Negative Negative    Assessment & Plan     Problem List Items Addressed This Visit     Allergic rhinitis   Relevant Medications   albuterol (VENTOLIN HFA) 108 (90 Base) MCG/ACT inhaler   Diabetes (HCC) (Chronic)   Encounter for annual wellness visit (AWV) in Medicare patient    Annual wellness visit completed today including all of the following: -Reviewed patient's family medical history -Reviewed and updated patient's list of medical providers -Completed assessment of cognitive impairment -Completed assessment of patient's functional ability -Provided patient with recommendations for health screening services as well as vaccines Health risk assessment completed and reviewed -Discussed recommendations for well-balanced diet in addition to 150 minutes of physical activity per week       Relevant Orders   Flu Vaccine Trivalent High Dose (Fluad) (Completed)   ESRD (end stage renal disease) (HCC) (Chronic)    Chronic  Manageed with HD, followed by nephrology       Essential hypertension (Chronic)   Hyperlipidemia (Chronic)   Other Visit Diagnoses     Sore throat    -  Primary   Relevant Orders   POCT rapid strep A (Completed)        Immunization History  Administered Date(s) Administered   Fluad Quad(high Dose 65+) 01/29/2019, 01/14/2020, 01/27/2021, 01/27/2022   Fluad Trivalent(High Dose 65+) 02/02/2023   Hepatitis B, ADULT 12/27/2019, 03/30/2020, 04/03/2020   Influenza, High Dose Seasonal PF 02/05/2015, 01/16/2016, 12/26/2016, 02/13/2018   PFIZER(Purple Top)SARS-COV-2 Vaccination 08/07/2019, 08/28/2019, 05/20/2020   PPD Test 11/29/2019, 10/30/2020   Pneumococcal Conjugate-13 02/04/2014   Pneumococcal Polysaccharide-23 04/07/2002   Td 10/19/2016    Health Maintenance  Topic Date Due   Zoster Vaccines- Shingrix (1 of 2) Never done    OPHTHALMOLOGY EXAM  10/04/2018   FOOT EXAM  12/10/2021   HEMOGLOBIN A1C  03/24/2022   COVID-19 Vaccine (4 - 2023-24 season) 01/01/2023   Medicare Annual Wellness (AWV)  02/02/2024   DTaP/Tdap/Td (2 - Tdap) 10/20/2026   Pneumonia Vaccine 37+ Years old  Completed   INFLUENZA VACCINE  Completed   DEXA SCAN  Completed   HPV VACCINES  Aged Out   Assessment and Plan    Sore Throat Mild sore throat for a week. No exudate seen on examination. Strep test negative. No associated symptoms like cough, ear pain, or difficulty swallowing. -Advise warm fluids like soup broth or tea for soothing. -If symptoms worsen or new symptoms develop (ear pain, difficulty swallowing, etc. ), patient to notify the clinic.  Joint Pain Complaints of joint pain in the knees and shoulder. Difficulty with mobility due to joint pain. -Continue current management.  Depression On Sertraline 25mg . Reports feeling hopeless sometimes, but denies feeling down, depressed, or having thoughts of self-harm. -Continue Sertraline 25mg .  Shortness of Breath Occasional shortness of breath, especially with exertion. Has Albuterol inhaler which helps. -Refill Albuterol inhaler prescription.  General  Health Maintenance -Flu shot administered today. -Advise to get the latest COVID-19 booster shot at the pharmacy. -Continue regular foot exams with the podiatrist. -Next eye exam for diabetes to be scheduled. -Follow-up appointment in January 2025.        Return in about 3 months (around 05/05/2023) for CHRONIC F/U.       [name], MD  [facility] [phone] (phone) [phone] (fax)  [facility]

## 2023-02-02 NOTE — Patient Instructions (Signed)
It was a pleasure to see you today!  Thank you for choosing [facility] for your primary care.   Today you were seen for your annual wellness visit  Please review the attached information regarding helpful preventive health topics.   To keep you healthy, please keep in mind the following health maintenance items that you are due for:   1.A1c  2. Great job on getting your flu vaccine today   Please make sure to schedule your next annual physical for one year from today.   I will see you in 3 months for your chronic follow up  Please call us if your throat symptoms do not improve   [phone]  Best Wishes,   Dr. Roxan Hockey

## 2023-02-03 DIAGNOSIS — Z992 Dependence on renal dialysis: Secondary | ICD-10-CM | POA: Diagnosis not present

## 2023-02-03 DIAGNOSIS — N186 End stage renal disease: Secondary | ICD-10-CM | POA: Diagnosis not present

## 2023-02-06 DIAGNOSIS — N186 End stage renal disease: Secondary | ICD-10-CM | POA: Diagnosis not present

## 2023-02-06 DIAGNOSIS — Z992 Dependence on renal dialysis: Secondary | ICD-10-CM | POA: Diagnosis not present

## 2023-02-08 DIAGNOSIS — Z992 Dependence on renal dialysis: Secondary | ICD-10-CM | POA: Diagnosis not present

## 2023-02-08 DIAGNOSIS — N186 End stage renal disease: Secondary | ICD-10-CM | POA: Diagnosis not present

## 2023-02-10 DIAGNOSIS — N186 End stage renal disease: Secondary | ICD-10-CM | POA: Diagnosis not present

## 2023-02-10 DIAGNOSIS — Z992 Dependence on renal dialysis: Secondary | ICD-10-CM | POA: Diagnosis not present

## 2023-02-13 DIAGNOSIS — Z992 Dependence on renal dialysis: Secondary | ICD-10-CM | POA: Diagnosis not present

## 2023-02-13 DIAGNOSIS — N186 End stage renal disease: Secondary | ICD-10-CM | POA: Diagnosis not present

## 2023-02-15 DIAGNOSIS — N186 End stage renal disease: Secondary | ICD-10-CM | POA: Diagnosis not present

## 2023-02-15 DIAGNOSIS — Z992 Dependence on renal dialysis: Secondary | ICD-10-CM | POA: Diagnosis not present

## 2023-02-17 DIAGNOSIS — N186 End stage renal disease: Secondary | ICD-10-CM | POA: Diagnosis not present

## 2023-02-17 DIAGNOSIS — Z992 Dependence on renal dialysis: Secondary | ICD-10-CM | POA: Diagnosis not present

## 2023-02-20 DIAGNOSIS — Z992 Dependence on renal dialysis: Secondary | ICD-10-CM | POA: Diagnosis not present

## 2023-02-20 DIAGNOSIS — N186 End stage renal disease: Secondary | ICD-10-CM | POA: Diagnosis not present

## 2023-02-22 ENCOUNTER — Other Ambulatory Visit: Payer: Self-pay | Admitting: Family Medicine

## 2023-02-22 DIAGNOSIS — N186 End stage renal disease: Secondary | ICD-10-CM | POA: Diagnosis not present

## 2023-02-22 DIAGNOSIS — Z992 Dependence on renal dialysis: Secondary | ICD-10-CM | POA: Diagnosis not present

## 2023-02-24 DIAGNOSIS — N186 End stage renal disease: Secondary | ICD-10-CM | POA: Diagnosis not present

## 2023-02-24 DIAGNOSIS — Z992 Dependence on renal dialysis: Secondary | ICD-10-CM | POA: Diagnosis not present

## 2023-02-27 DIAGNOSIS — N186 End stage renal disease: Secondary | ICD-10-CM | POA: Diagnosis not present

## 2023-02-27 DIAGNOSIS — Z992 Dependence on renal dialysis: Secondary | ICD-10-CM | POA: Diagnosis not present

## 2023-02-27 DIAGNOSIS — E119 Type 2 diabetes mellitus without complications: Secondary | ICD-10-CM | POA: Diagnosis not present

## 2023-03-01 DIAGNOSIS — N186 End stage renal disease: Secondary | ICD-10-CM | POA: Diagnosis not present

## 2023-03-01 DIAGNOSIS — Z992 Dependence on renal dialysis: Secondary | ICD-10-CM | POA: Diagnosis not present

## 2023-03-02 DIAGNOSIS — Z992 Dependence on renal dialysis: Secondary | ICD-10-CM | POA: Diagnosis not present

## 2023-03-02 DIAGNOSIS — N186 End stage renal disease: Secondary | ICD-10-CM | POA: Diagnosis not present

## 2023-03-03 DIAGNOSIS — N186 End stage renal disease: Secondary | ICD-10-CM | POA: Diagnosis not present

## 2023-03-03 DIAGNOSIS — Z992 Dependence on renal dialysis: Secondary | ICD-10-CM | POA: Diagnosis not present

## 2023-03-06 DIAGNOSIS — N186 End stage renal disease: Secondary | ICD-10-CM | POA: Diagnosis not present

## 2023-03-06 DIAGNOSIS — Z992 Dependence on renal dialysis: Secondary | ICD-10-CM | POA: Diagnosis not present

## 2023-03-08 DIAGNOSIS — N186 End stage renal disease: Secondary | ICD-10-CM | POA: Diagnosis not present

## 2023-03-08 DIAGNOSIS — Z992 Dependence on renal dialysis: Secondary | ICD-10-CM | POA: Diagnosis not present

## 2023-03-10 DIAGNOSIS — Z992 Dependence on renal dialysis: Secondary | ICD-10-CM | POA: Diagnosis not present

## 2023-03-10 DIAGNOSIS — N186 End stage renal disease: Secondary | ICD-10-CM | POA: Diagnosis not present

## 2023-03-13 DIAGNOSIS — N186 End stage renal disease: Secondary | ICD-10-CM | POA: Diagnosis not present

## 2023-03-13 DIAGNOSIS — Z992 Dependence on renal dialysis: Secondary | ICD-10-CM | POA: Diagnosis not present

## 2023-03-15 DIAGNOSIS — Z992 Dependence on renal dialysis: Secondary | ICD-10-CM | POA: Diagnosis not present

## 2023-03-15 DIAGNOSIS — N186 End stage renal disease: Secondary | ICD-10-CM | POA: Diagnosis not present

## 2023-03-17 DIAGNOSIS — N186 End stage renal disease: Secondary | ICD-10-CM | POA: Diagnosis not present

## 2023-03-17 DIAGNOSIS — Z992 Dependence on renal dialysis: Secondary | ICD-10-CM | POA: Diagnosis not present

## 2023-03-19 DIAGNOSIS — Z992 Dependence on renal dialysis: Secondary | ICD-10-CM | POA: Diagnosis not present

## 2023-03-19 DIAGNOSIS — N186 End stage renal disease: Secondary | ICD-10-CM | POA: Diagnosis not present

## 2023-03-20 DIAGNOSIS — Z992 Dependence on renal dialysis: Secondary | ICD-10-CM | POA: Diagnosis not present

## 2023-03-20 DIAGNOSIS — N186 End stage renal disease: Secondary | ICD-10-CM | POA: Diagnosis not present

## 2023-03-22 DIAGNOSIS — N186 End stage renal disease: Secondary | ICD-10-CM | POA: Diagnosis not present

## 2023-03-22 DIAGNOSIS — Z992 Dependence on renal dialysis: Secondary | ICD-10-CM | POA: Diagnosis not present

## 2023-03-24 DIAGNOSIS — Z992 Dependence on renal dialysis: Secondary | ICD-10-CM | POA: Diagnosis not present

## 2023-03-24 DIAGNOSIS — N186 End stage renal disease: Secondary | ICD-10-CM | POA: Diagnosis not present

## 2023-03-27 DIAGNOSIS — Z992 Dependence on renal dialysis: Secondary | ICD-10-CM | POA: Diagnosis not present

## 2023-03-27 DIAGNOSIS — N186 End stage renal disease: Secondary | ICD-10-CM | POA: Diagnosis not present

## 2023-03-29 DIAGNOSIS — N186 End stage renal disease: Secondary | ICD-10-CM | POA: Diagnosis not present

## 2023-03-29 DIAGNOSIS — Z992 Dependence on renal dialysis: Secondary | ICD-10-CM | POA: Diagnosis not present

## 2023-03-31 DIAGNOSIS — Z992 Dependence on renal dialysis: Secondary | ICD-10-CM | POA: Diagnosis not present

## 2023-03-31 DIAGNOSIS — N186 End stage renal disease: Secondary | ICD-10-CM | POA: Diagnosis not present

## 2023-04-01 DIAGNOSIS — Z992 Dependence on renal dialysis: Secondary | ICD-10-CM | POA: Diagnosis not present

## 2023-04-01 DIAGNOSIS — N186 End stage renal disease: Secondary | ICD-10-CM | POA: Diagnosis not present

## 2023-04-02 DIAGNOSIS — N2581 Secondary hyperparathyroidism of renal origin: Secondary | ICD-10-CM | POA: Diagnosis not present

## 2023-04-02 DIAGNOSIS — Z992 Dependence on renal dialysis: Secondary | ICD-10-CM | POA: Diagnosis not present

## 2023-04-02 DIAGNOSIS — N186 End stage renal disease: Secondary | ICD-10-CM | POA: Diagnosis not present

## 2023-04-03 DIAGNOSIS — Z992 Dependence on renal dialysis: Secondary | ICD-10-CM | POA: Diagnosis not present

## 2023-04-03 DIAGNOSIS — N2581 Secondary hyperparathyroidism of renal origin: Secondary | ICD-10-CM | POA: Diagnosis not present

## 2023-04-03 DIAGNOSIS — N186 End stage renal disease: Secondary | ICD-10-CM | POA: Diagnosis not present

## 2023-04-05 DIAGNOSIS — Z992 Dependence on renal dialysis: Secondary | ICD-10-CM | POA: Diagnosis not present

## 2023-04-05 DIAGNOSIS — N2581 Secondary hyperparathyroidism of renal origin: Secondary | ICD-10-CM | POA: Diagnosis not present

## 2023-04-05 DIAGNOSIS — N186 End stage renal disease: Secondary | ICD-10-CM | POA: Diagnosis not present

## 2023-04-06 ENCOUNTER — Other Ambulatory Visit: Payer: Self-pay

## 2023-04-06 ENCOUNTER — Emergency Department: Payer: 59

## 2023-04-06 ENCOUNTER — Ambulatory Visit (INDEPENDENT_AMBULATORY_CARE_PROVIDER_SITE_OTHER): Payer: 59 | Admitting: Family Medicine

## 2023-04-06 ENCOUNTER — Encounter: Payer: Self-pay | Admitting: Emergency Medicine

## 2023-04-06 ENCOUNTER — Encounter: Payer: Self-pay | Admitting: Family Medicine

## 2023-04-06 VITALS — BP 186/90 | HR 96 | Temp 98.1°F | Resp 16 | Ht 63.0 in | Wt 80.4 lb

## 2023-04-06 DIAGNOSIS — G4700 Insomnia, unspecified: Secondary | ICD-10-CM | POA: Diagnosis not present

## 2023-04-06 DIAGNOSIS — Z681 Body mass index (BMI) 19 or less, adult: Secondary | ICD-10-CM

## 2023-04-06 DIAGNOSIS — A408 Other streptococcal sepsis: Principal | ICD-10-CM | POA: Diagnosis present

## 2023-04-06 DIAGNOSIS — B370 Candidal stomatitis: Secondary | ICD-10-CM | POA: Diagnosis not present

## 2023-04-06 DIAGNOSIS — F419 Anxiety disorder, unspecified: Secondary | ICD-10-CM | POA: Diagnosis present

## 2023-04-06 DIAGNOSIS — K5730 Diverticulosis of large intestine without perforation or abscess without bleeding: Secondary | ICD-10-CM | POA: Diagnosis not present

## 2023-04-06 DIAGNOSIS — Z992 Dependence on renal dialysis: Secondary | ICD-10-CM | POA: Diagnosis not present

## 2023-04-06 DIAGNOSIS — B955 Unspecified streptococcus as the cause of diseases classified elsewhere: Secondary | ICD-10-CM | POA: Diagnosis not present

## 2023-04-06 DIAGNOSIS — I953 Hypotension of hemodialysis: Secondary | ICD-10-CM | POA: Diagnosis not present

## 2023-04-06 DIAGNOSIS — E1122 Type 2 diabetes mellitus with diabetic chronic kidney disease: Secondary | ICD-10-CM | POA: Diagnosis not present

## 2023-04-06 DIAGNOSIS — R1310 Dysphagia, unspecified: Secondary | ICD-10-CM | POA: Diagnosis not present

## 2023-04-06 DIAGNOSIS — R293 Abnormal posture: Secondary | ICD-10-CM | POA: Diagnosis not present

## 2023-04-06 DIAGNOSIS — E43 Unspecified severe protein-calorie malnutrition: Secondary | ICD-10-CM | POA: Diagnosis present

## 2023-04-06 DIAGNOSIS — K8020 Calculus of gallbladder without cholecystitis without obstruction: Secondary | ICD-10-CM | POA: Diagnosis not present

## 2023-04-06 DIAGNOSIS — E785 Hyperlipidemia, unspecified: Secondary | ICD-10-CM | POA: Diagnosis not present

## 2023-04-06 DIAGNOSIS — R6889 Other general symptoms and signs: Secondary | ICD-10-CM | POA: Diagnosis not present

## 2023-04-06 DIAGNOSIS — K2970 Gastritis, unspecified, without bleeding: Secondary | ICD-10-CM | POA: Diagnosis present

## 2023-04-06 DIAGNOSIS — Z87891 Personal history of nicotine dependence: Secondary | ICD-10-CM

## 2023-04-06 DIAGNOSIS — Z9981 Dependence on supplemental oxygen: Secondary | ICD-10-CM

## 2023-04-06 DIAGNOSIS — B954 Other streptococcus as the cause of diseases classified elsewhere: Secondary | ICD-10-CM | POA: Diagnosis not present

## 2023-04-06 DIAGNOSIS — Z885 Allergy status to narcotic agent status: Secondary | ICD-10-CM

## 2023-04-06 DIAGNOSIS — R109 Unspecified abdominal pain: Secondary | ICD-10-CM

## 2023-04-06 DIAGNOSIS — R627 Adult failure to thrive: Secondary | ICD-10-CM | POA: Diagnosis not present

## 2023-04-06 DIAGNOSIS — I132 Hypertensive heart and chronic kidney disease with heart failure and with stage 5 chronic kidney disease, or end stage renal disease: Secondary | ICD-10-CM | POA: Diagnosis not present

## 2023-04-06 DIAGNOSIS — Z8249 Family history of ischemic heart disease and other diseases of the circulatory system: Secondary | ICD-10-CM | POA: Diagnosis not present

## 2023-04-06 DIAGNOSIS — I10 Essential (primary) hypertension: Secondary | ICD-10-CM | POA: Diagnosis not present

## 2023-04-06 DIAGNOSIS — N2581 Secondary hyperparathyroidism of renal origin: Secondary | ICD-10-CM | POA: Diagnosis present

## 2023-04-06 DIAGNOSIS — Z743 Need for continuous supervision: Secondary | ICD-10-CM | POA: Diagnosis not present

## 2023-04-06 DIAGNOSIS — I2510 Atherosclerotic heart disease of native coronary artery without angina pectoris: Secondary | ICD-10-CM | POA: Diagnosis present

## 2023-04-06 DIAGNOSIS — Z555 Less than a high school diploma: Secondary | ICD-10-CM

## 2023-04-06 DIAGNOSIS — E876 Hypokalemia: Secondary | ICD-10-CM | POA: Diagnosis present

## 2023-04-06 DIAGNOSIS — J9601 Acute respiratory failure with hypoxia: Secondary | ICD-10-CM | POA: Diagnosis not present

## 2023-04-06 DIAGNOSIS — I517 Cardiomegaly: Secondary | ICD-10-CM | POA: Diagnosis not present

## 2023-04-06 DIAGNOSIS — J9621 Acute and chronic respiratory failure with hypoxia: Secondary | ICD-10-CM | POA: Diagnosis not present

## 2023-04-06 DIAGNOSIS — I444 Left anterior fascicular block: Secondary | ICD-10-CM | POA: Diagnosis present

## 2023-04-06 DIAGNOSIS — R69 Illness, unspecified: Secondary | ICD-10-CM | POA: Diagnosis not present

## 2023-04-06 DIAGNOSIS — N390 Urinary tract infection, site not specified: Secondary | ICD-10-CM | POA: Diagnosis present

## 2023-04-06 DIAGNOSIS — R0902 Hypoxemia: Secondary | ICD-10-CM | POA: Diagnosis not present

## 2023-04-06 DIAGNOSIS — G8929 Other chronic pain: Secondary | ICD-10-CM | POA: Diagnosis present

## 2023-04-06 DIAGNOSIS — R932 Abnormal findings on diagnostic imaging of liver and biliary tract: Secondary | ICD-10-CM | POA: Diagnosis not present

## 2023-04-06 DIAGNOSIS — R1312 Dysphagia, oropharyngeal phase: Secondary | ICD-10-CM | POA: Diagnosis not present

## 2023-04-06 DIAGNOSIS — R2681 Unsteadiness on feet: Secondary | ICD-10-CM | POA: Diagnosis not present

## 2023-04-06 DIAGNOSIS — I120 Hypertensive chronic kidney disease with stage 5 chronic kidney disease or end stage renal disease: Secondary | ICD-10-CM | POA: Diagnosis not present

## 2023-04-06 DIAGNOSIS — I5030 Unspecified diastolic (congestive) heart failure: Secondary | ICD-10-CM | POA: Diagnosis not present

## 2023-04-06 DIAGNOSIS — R7881 Bacteremia: Secondary | ICD-10-CM | POA: Diagnosis not present

## 2023-04-06 DIAGNOSIS — R319 Hematuria, unspecified: Secondary | ICD-10-CM | POA: Diagnosis not present

## 2023-04-06 DIAGNOSIS — R0689 Other abnormalities of breathing: Secondary | ICD-10-CM | POA: Diagnosis not present

## 2023-04-06 DIAGNOSIS — I129 Hypertensive chronic kidney disease with stage 1 through stage 4 chronic kidney disease, or unspecified chronic kidney disease: Secondary | ICD-10-CM | POA: Diagnosis not present

## 2023-04-06 DIAGNOSIS — R9389 Abnormal findings on diagnostic imaging of other specified body structures: Secondary | ICD-10-CM | POA: Diagnosis not present

## 2023-04-06 DIAGNOSIS — M47812 Spondylosis without myelopathy or radiculopathy, cervical region: Secondary | ICD-10-CM | POA: Diagnosis present

## 2023-04-06 DIAGNOSIS — J189 Pneumonia, unspecified organism: Secondary | ICD-10-CM | POA: Diagnosis not present

## 2023-04-06 DIAGNOSIS — J9620 Acute and chronic respiratory failure, unspecified whether with hypoxia or hypercapnia: Secondary | ICD-10-CM | POA: Diagnosis not present

## 2023-04-06 DIAGNOSIS — F32A Depression, unspecified: Secondary | ICD-10-CM | POA: Diagnosis present

## 2023-04-06 DIAGNOSIS — R634 Abnormal weight loss: Secondary | ICD-10-CM

## 2023-04-06 DIAGNOSIS — I959 Hypotension, unspecified: Secondary | ICD-10-CM | POA: Diagnosis not present

## 2023-04-06 DIAGNOSIS — I5032 Chronic diastolic (congestive) heart failure: Secondary | ICD-10-CM | POA: Diagnosis not present

## 2023-04-06 DIAGNOSIS — I3139 Other pericardial effusion (noninflammatory): Secondary | ICD-10-CM | POA: Diagnosis not present

## 2023-04-06 DIAGNOSIS — Z79899 Other long term (current) drug therapy: Secondary | ICD-10-CM | POA: Diagnosis not present

## 2023-04-06 DIAGNOSIS — E119 Type 2 diabetes mellitus without complications: Secondary | ICD-10-CM | POA: Diagnosis not present

## 2023-04-06 DIAGNOSIS — Z741 Need for assistance with personal care: Secondary | ICD-10-CM | POA: Diagnosis not present

## 2023-04-06 DIAGNOSIS — K219 Gastro-esophageal reflux disease without esophagitis: Secondary | ICD-10-CM | POA: Diagnosis not present

## 2023-04-06 DIAGNOSIS — I5022 Chronic systolic (congestive) heart failure: Secondary | ICD-10-CM | POA: Diagnosis not present

## 2023-04-06 DIAGNOSIS — J384 Edema of larynx: Secondary | ICD-10-CM | POA: Diagnosis present

## 2023-04-06 DIAGNOSIS — I252 Old myocardial infarction: Secondary | ICD-10-CM

## 2023-04-06 DIAGNOSIS — N186 End stage renal disease: Secondary | ICD-10-CM | POA: Diagnosis not present

## 2023-04-06 DIAGNOSIS — M6259 Muscle wasting and atrophy, not elsewhere classified, multiple sites: Secondary | ICD-10-CM | POA: Diagnosis not present

## 2023-04-06 DIAGNOSIS — I700 Atherosclerosis of aorta: Secondary | ICD-10-CM | POA: Diagnosis not present

## 2023-04-06 DIAGNOSIS — J029 Acute pharyngitis, unspecified: Secondary | ICD-10-CM | POA: Diagnosis not present

## 2023-04-06 DIAGNOSIS — R918 Other nonspecific abnormal finding of lung field: Secondary | ICD-10-CM | POA: Diagnosis not present

## 2023-04-06 DIAGNOSIS — R490 Dysphonia: Secondary | ICD-10-CM | POA: Diagnosis not present

## 2023-04-06 DIAGNOSIS — D631 Anemia in chronic kidney disease: Secondary | ICD-10-CM | POA: Diagnosis present

## 2023-04-06 DIAGNOSIS — R0989 Other specified symptoms and signs involving the circulatory and respiratory systems: Secondary | ICD-10-CM | POA: Diagnosis not present

## 2023-04-06 DIAGNOSIS — R29890 Loss of height: Secondary | ICD-10-CM | POA: Diagnosis not present

## 2023-04-06 DIAGNOSIS — Z1152 Encounter for screening for COVID-19: Secondary | ICD-10-CM

## 2023-04-06 DIAGNOSIS — K111 Hypertrophy of salivary gland: Secondary | ICD-10-CM

## 2023-04-06 DIAGNOSIS — M545 Low back pain, unspecified: Secondary | ICD-10-CM | POA: Diagnosis present

## 2023-04-06 DIAGNOSIS — E569 Vitamin deficiency, unspecified: Secondary | ICD-10-CM | POA: Diagnosis not present

## 2023-04-06 DIAGNOSIS — M898X9 Other specified disorders of bone, unspecified site: Secondary | ICD-10-CM | POA: Diagnosis present

## 2023-04-06 DIAGNOSIS — H409 Unspecified glaucoma: Secondary | ICD-10-CM | POA: Diagnosis not present

## 2023-04-06 DIAGNOSIS — Z90710 Acquired absence of both cervix and uterus: Secondary | ICD-10-CM

## 2023-04-06 LAB — CBC WITH DIFFERENTIAL/PLATELET
Abs Immature Granulocytes: 0.02 10*3/uL (ref 0.00–0.07)
Basophils Absolute: 0 10*3/uL (ref 0.0–0.1)
Basophils Relative: 0 %
Eosinophils Absolute: 0.1 10*3/uL (ref 0.0–0.5)
Eosinophils Relative: 1 %
HCT: 38.9 % (ref 36.0–46.0)
Hemoglobin: 12.3 g/dL (ref 12.0–15.0)
Immature Granulocytes: 0 %
Lymphocytes Relative: 10 %
Lymphs Abs: 0.8 10*3/uL (ref 0.7–4.0)
MCH: 28.3 pg (ref 26.0–34.0)
MCHC: 31.6 g/dL (ref 30.0–36.0)
MCV: 89.4 fL (ref 80.0–100.0)
Monocytes Absolute: 0.4 10*3/uL (ref 0.1–1.0)
Monocytes Relative: 5 %
Neutro Abs: 6.7 10*3/uL (ref 1.7–7.7)
Neutrophils Relative %: 84 %
Platelets: 213 10*3/uL (ref 150–400)
RBC: 4.35 MIL/uL (ref 3.87–5.11)
RDW: 15.2 % (ref 11.5–15.5)
WBC: 8 10*3/uL (ref 4.0–10.5)
nRBC: 0 % (ref 0.0–0.2)

## 2023-04-06 LAB — POCT URINALYSIS DIPSTICK
Bilirubin, UA: NEGATIVE
Glucose, UA: NEGATIVE
Ketones, UA: NEGATIVE
Nitrite, UA: NEGATIVE
Protein, UA: POSITIVE — AB
Spec Grav, UA: 1.015 (ref 1.010–1.025)
Urobilinogen, UA: 0.2 U/dL
pH, UA: 7 (ref 5.0–8.0)

## 2023-04-06 LAB — BASIC METABOLIC PANEL
Anion gap: 9 (ref 5–15)
BUN: 22 mg/dL (ref 8–23)
CO2: 30 mmol/L (ref 22–32)
Calcium: 9.1 mg/dL (ref 8.9–10.3)
Chloride: 100 mmol/L (ref 98–111)
Creatinine, Ser: 2.36 mg/dL — ABNORMAL HIGH (ref 0.44–1.00)
GFR, Estimated: 19 mL/min — ABNORMAL LOW (ref >60)
Glucose, Bld: 107 mg/dL — ABNORMAL HIGH (ref 70–99)
Potassium: 2.6 mmol/L — CL (ref 3.5–5.1)
Sodium: 139 mmol/L (ref 135–145)

## 2023-04-06 MED ORDER — HYDROXYZINE HCL 10 MG PO TABS: 10.0000 mg | ORAL_TABLET | Freq: Every evening | ORAL | 0 refills | Status: DC | PRN

## 2023-04-06 NOTE — Patient Instructions (Signed)
VISIT SUMMARY:  During today's visit, we discussed your persistent throat discomfort, right flank pain, hypertension, and anxiety. We reviewed your symptoms, current medications, and recent changes in your health. We also discussed the importance of addressing your nutritional needs and managing your stress levels.  YOUR PLAN:  -DYSPHAGIA: Dysphagia means difficulty swallowing. You have been experiencing this on the left side, which has led to weight loss and malnutrition. We are concerned about possible glandular enlargement or a nodule under your left jaw. We will urgently refer you to an ENT specialist for further evaluation and possible biopsy. In the meantime, you can take acetaminophen for pain and drink warm tea with honey to soothe your throat.  -RIGHT FLANK PAIN: You have pain on the right side that gets worse when you sit down. This could be due to a urinary tract infection or a kidney-related issue. We will conduct a urine test to check for infection and an ultrasound to evaluate the cause of the pain.  -HYPERTENSION: Hypertension means high blood pressure. Your blood pressure may be higher due to stress and malnutrition. It is important to monitor your blood pressure closely and manage your stress to prevent complications.  -ANXIETY: You have been feeling jittery and nervous. We discussed using hydroxyzine, which is a medication that can help manage anxiety. Be aware that it may cause drowsiness.  -GENERAL HEALTH MAINTENANCE: Your routine blood work and dialysis-related labs are up to date. We will order additional blood tests to monitor your overall health and address your current symptoms.  INSTRUCTIONS:  Please follow up in one week to review your lab results and the recommendations from the specialists.   Please proceed to the emergency department for evaluation for your flank pain, elevated blood pressure and concern for difficulty eating due to throat pain likely contributing  to significant weight loss.

## 2023-04-06 NOTE — ED Triage Notes (Signed)
Pt to ED via [facility] from home. Pt saw her PCP today, per PCP note she is "recommending that she be evaluated in the ED due to elevated blood pressure, ESRD, severe weight loss and difficulty eating. I am concerned that she may need IV nutrition as well as imaging for the blood on her urine sample today and right flank pain with increased risk of developing infection."  Pts O2 sats were 88% on room air. Pt was placed on 2 liter by EMS and her sats improved to 100%. Pt states that she wears oxygen sometimes at home. Pt is in NAD.

## 2023-04-06 NOTE — Progress Notes (Signed)
Established patient visit   Patient: Lori Stanley   DOB: [birth year]   89 y.o. Female  MRN: [id number] Visit Date: 04/06/2023  Today's healthcare provider: [name], MD   Chief Complaint  Patient presents with   Sore Throat   Flank Pain    R sided started one week ago, no urinary symptoms   Subjective     HPI     Flank Pain    Additional comments: R sided started one week ago, no urinary symptoms      Last edited by Ashok Cordia, CMA on 04/06/2023 10:52 AM.       Discussed the use of AI scribe software for clinical note transcription with the patient, who gave verbal consent to proceed.  History of Present Illness   The patient presents with a chief complaint of persistent throat discomfort, which has been affecting her ability to eat. She describes the throat as feeling sore, particularly on the left side, and reports difficulty swallowing. The patient has been taking prescribed medication and using cough drops to manage the discomfort. She also reports a sensation of tightness in the throat, which she believes is related to her posture.  In addition to the throat discomfort, the patient has been experiencing pain on the right side, which she describes as making it difficult to sit down. This pain began around the same time as the throat discomfort. The patient reports no blood in her urine and has been having regular bowel movements.  The patient has been eating less due to the throat discomfort and difficulty swallowing, and has lost a significant amount of weight. She reports feeling jittery and upset, which she attributes to her nerves. The patient has been taking medication for these symptoms, but reports that the medication was recently changed.  The patient has a history of kidney disease and is on dialysis. She reports having blood work done weekly as part of her dialysis treatment. The patient also reports a history of hypertension.          Past Medical History:  Diagnosis Date   Acute heart failure (HCC)    Acute on chronic heart failure with preserved ejection fraction (HFpEF) (HCC) 06/05/2019   Anemia    Anemia due to stage 4 chronic kidney disease (HCC) 08/27/2019   Anxiety    CHF (congestive heart failure) (HCC)    Chronic kidney disease    stage V; end stage renal disease   Chronic kidney disease, stage 5 (HCC) 01/23/2019   Chronic low back pain    Depression    Diabetes mellitus without complication (HCC)    Essential hypertension, malignant 05/24/2013   GERD (gastroesophageal reflux disease)    Hematoma 07/17/2020   Hyperlipidemia    Hypertension    MI (myocardial infarction) (HCC)    Obesity    Proteinuria 01/23/2019   Respiratory failure (HCC) 04/09/2017   SOB (shortness of breath) 05/24/2013   Unilateral inguinal hernia with obstruction and without gangrene    Volume overload 08/30/2019    Medications: Outpatient Medications Prior to Visit  Medication Sig   acetaminophen (TYLENOL) 500 MG tablet Take 500-1,000 mg by mouth every 6 (six) hours as needed for mild pain or fever.    albuterol (VENTOLIN HFA) 108 (90 Base) MCG/ACT inhaler Inhale 2 puffs into the lungs every 6 (six) hours as needed for wheezing or shortness of breath.   calcitRIOL (ROCALTROL) 0.25 MCG capsule Take 0.25 mcg by mouth every Monday,  Wednesday, and Friday.    carvedilol (COREG) 3.125 MG tablet Take 3.125 mg by mouth 2 (two) times daily.   fluticasone (FLONASE) 50 MCG/ACT nasal spray SPRAY 1 SPRAY INTO BOTH NOSTRILS DAILY.   loratadine (CLARITIN) 10 MG tablet Take 1 tablet (10 mg total) by mouth daily.   multivitamin (RENA-VIT) TABS tablet Take 1 tablet by mouth daily.    omeprazole (PRILOSEC) 40 MG capsule Take 1 capsule (40 mg total) by mouth daily.   OXYGEN Inhale 2 L into the lungs at bedtime.   sertraline (ZOLOFT) 25 MG tablet TAKE 1 TABLET BY MOUTH EVERY DAY   torsemide (DEMADEX) 100 MG tablet TAKE 1 TABLET BY MOUTH EVERY DAY    traZODone (DESYREL) 150 MG tablet TAKE 1 TABLET BY MOUTH EVERYDAY AT BEDTIME   No facility-administered medications prior to visit.    Review of Systems  Last CBC Lab Results  Component Value Date   WBC 5.9 03/09/2022   HGB 12.0 03/09/2022   HCT 40.1 03/09/2022   MCV 88.5 03/09/2022   MCH 26.5 03/09/2022   RDW 19.3 (H) 03/09/2022   PLT 215 03/09/2022   Last metabolic panel Lab Results  Component Value Date   GLUCOSE 142 (H) 03/09/2022   NA 142 03/09/2022   K 3.4 (L) 03/09/2022   CL 106 03/09/2022   CO2 26 03/09/2022   BUN 28 (H) 03/09/2022   CREATININE 3.15 (H) 03/09/2022   GFRNONAA 14 (L) 03/09/2022   CALCIUM 8.9 03/09/2022   PHOS 1.7 (L) 07/20/2020   PROT 5.5 (L) 11/27/2021   ALBUMIN 2.8 (L) 11/27/2021   LABGLOB 2.4 09/21/2021   AGRATIO 1.7 09/21/2021   BILITOT 0.5 11/27/2021   ALKPHOS 92 11/27/2021   AST 15 11/27/2021   ALT 12 11/27/2021   ANIONGAP 10 03/09/2022   Last thyroid functions Lab Results  Component Value Date   TSH 2.840 09/21/2021    UA: has large blood cells, leukocytes     Objective    BP (!) 186/90   Pulse 96   Temp 98.1 F (36.7 C)   Resp 16   Ht 5\' 3"  (1.6 m)   Wt 80 lb 6.4 oz (36.5 kg)   BMI 14.24 kg/m  BP Readings from Last 3 Encounters:  04/06/23 (!) 186/90  02/02/23 (!) 160/58  06/09/22 (!) 170/66   Wt Readings from Last 3 Encounters:  04/06/23 80 lb 6.4 oz (36.5 kg)  02/02/23 116 lb (52.6 kg)  11/01/22 110 lb (49.9 kg)      Physical Exam Constitutional:      General: She is not in acute distress.    Comments: Frail appearing elderly female with pleasant demeanor   Facial muscle atrophy   Neck:      Comments: Patient holds neck in flexion position throughout exam Abdominal:     Tenderness: There is abdominal tenderness. There is no right CVA tenderness.     Comments: Right flank pain No overlying erythema of skin on right flank  No distension  Scaphoid abdomen  Normal BS    Musculoskeletal:      Cervical back: No erythema. Decreased range of motion.  Lymphadenopathy:     Cervical: Cervical adenopathy present.  Neurological:     Mental Status: She is alert.       Results for orders placed or performed in visit on 04/06/23  POCT Urinalysis Dipstick  Result Value Ref Range   Color, UA Orange    Clarity, UA Cloudy    Glucose, UA  Negative Negative   Bilirubin, UA Negative    Ketones, UA Negative    Spec Grav, UA 1.015 1.010 - 1.025   Blood, UA Large    pH, UA 7.0 5.0 - 8.0   Protein, UA Positive (A) Negative   Urobilinogen, UA 0.2 0.2 or 1.0 E.U./dL   Nitrite, UA Negative    Leukocytes, UA Large (3+) (A) Negative   Appearance     Odor      Assessment & Plan     Problem List Items Addressed This Visit       Digestive   Enlarged salivary gland - Primary   Relevant Orders   Thyroid Profile   Ambulatory referral to ENT     Other   Weight loss, non-intentional   Relevant Orders   Comprehensive metabolic panel   CBC   Lipase   Thyroid Profile   Ambulatory referral to ENT   Severe protein-calorie malnutrition (HCC)   Relevant Orders   Comprehensive metabolic panel   CBC   Lipase   Thyroid Profile   Other Visit Diagnoses     Sore throat       Oropharyngeal dysphagia       Relevant Orders   CBC   Thyroid Profile   Ambulatory referral to ENT   Flank pain       Relevant Orders   Urine Culture   POCT Urinalysis Dipstick (Completed)           Dysphagia Difficulty swallowing on the left side with associated weight loss and malnutrition. Possible glandular enlargement or nodule under the left jaw. Differential includes glandular enlargement, salivary gland issues, or tumor. Emphasized the importance of addressing malnutrition and potential risks if untreated. - Submit urgent referral to ENT for further evaluation and possible biopsy - Recommend acetaminophen for pain management - Advise warm tea with honey to soothe throat  Right Flank Pain Pain on  the right side, exacerbated by sitting. Differential includes urinary tract infection or kidney-related issues. Discussed the need for urine tests and an ultrasound to evaluate the cause. - Order urine test to check for infection - Order ultrasound to evaluate the flank pain  Hypertension Elevated blood pressure, likely exacerbated by stress and malnutrition. Discussed the importance of monitoring blood pressure closely and managing stress to prevent complications. - Monitor blood pressure closely - Discuss stress management techniques  Anxiety Reports of feeling jittery and nervous, seeking medication for relief. Discussed the use of hydroxyzine for anxiety management and its potential side effects, including drowsiness. - Prescribe hydroxyzine 10 mg for anxiety  General Health Maintenance Routine blood work and dialysis-related labs are up to date. Discussed the need for additional blood tests to monitor overall health and address current symptoms. - Order complete metabolic panel, CBC, lipase, and thyroid studies  I am recommending that she be evaluated in the ED due to elevated blood pressure, ESRD, severe weight loss and difficulty eating. I am concerned that she may need IV nutrition as well as imaging for the blood on her urine sample today and right flank pain with increased risk of developing infection.    Return in about 1 week (around 04/13/2023) for weight loss, sore throat .       [name], MD  [facility] [phone] (phone) [phone] (fax)  [facility]

## 2023-04-07 ENCOUNTER — Inpatient Hospital Stay
Admission: EM | Admit: 2023-04-07 | Discharge: 2023-04-18 | DRG: 871 | Disposition: A | Discharge status: Skilled Nursing Facility | Payer: 59 | Attending: Internal Medicine | Admitting: Internal Medicine

## 2023-04-07 ENCOUNTER — Observation Stay: Payer: 59

## 2023-04-07 DIAGNOSIS — H409 Unspecified glaucoma: Secondary | ICD-10-CM | POA: Diagnosis not present

## 2023-04-07 DIAGNOSIS — R7881 Bacteremia: Secondary | ICD-10-CM | POA: Diagnosis not present

## 2023-04-07 DIAGNOSIS — Z1152 Encounter for screening for COVID-19: Secondary | ICD-10-CM | POA: Diagnosis not present

## 2023-04-07 DIAGNOSIS — J9620 Acute and chronic respiratory failure, unspecified whether with hypoxia or hypercapnia: Secondary | ICD-10-CM | POA: Diagnosis not present

## 2023-04-07 DIAGNOSIS — I10 Essential (primary) hypertension: Secondary | ICD-10-CM | POA: Diagnosis present

## 2023-04-07 DIAGNOSIS — R29890 Loss of height: Secondary | ICD-10-CM | POA: Diagnosis not present

## 2023-04-07 DIAGNOSIS — M6259 Muscle wasting and atrophy, not elsewhere classified, multiple sites: Secondary | ICD-10-CM | POA: Diagnosis not present

## 2023-04-07 DIAGNOSIS — I5030 Unspecified diastolic (congestive) heart failure: Secondary | ICD-10-CM | POA: Diagnosis not present

## 2023-04-07 DIAGNOSIS — N2581 Secondary hyperparathyroidism of renal origin: Secondary | ICD-10-CM | POA: Diagnosis not present

## 2023-04-07 DIAGNOSIS — I5022 Chronic systolic (congestive) heart failure: Secondary | ICD-10-CM | POA: Diagnosis not present

## 2023-04-07 DIAGNOSIS — Z8249 Family history of ischemic heart disease and other diseases of the circulatory system: Secondary | ICD-10-CM | POA: Diagnosis not present

## 2023-04-07 DIAGNOSIS — K2970 Gastritis, unspecified, without bleeding: Secondary | ICD-10-CM | POA: Diagnosis not present

## 2023-04-07 DIAGNOSIS — R918 Other nonspecific abnormal finding of lung field: Secondary | ICD-10-CM | POA: Diagnosis not present

## 2023-04-07 DIAGNOSIS — J9601 Acute respiratory failure with hypoxia: Secondary | ICD-10-CM | POA: Diagnosis not present

## 2023-04-07 DIAGNOSIS — Z992 Dependence on renal dialysis: Secondary | ICD-10-CM

## 2023-04-07 DIAGNOSIS — R1310 Dysphagia, unspecified: Secondary | ICD-10-CM

## 2023-04-07 DIAGNOSIS — E1122 Type 2 diabetes mellitus with diabetic chronic kidney disease: Secondary | ICD-10-CM | POA: Diagnosis not present

## 2023-04-07 DIAGNOSIS — E876 Hypokalemia: Secondary | ICD-10-CM

## 2023-04-07 DIAGNOSIS — R490 Dysphonia: Secondary | ICD-10-CM | POA: Diagnosis not present

## 2023-04-07 DIAGNOSIS — E43 Unspecified severe protein-calorie malnutrition: Secondary | ICD-10-CM | POA: Diagnosis present

## 2023-04-07 DIAGNOSIS — N186 End stage renal disease: Secondary | ICD-10-CM | POA: Diagnosis not present

## 2023-04-07 DIAGNOSIS — E785 Hyperlipidemia, unspecified: Secondary | ICD-10-CM | POA: Diagnosis present

## 2023-04-07 DIAGNOSIS — K219 Gastro-esophageal reflux disease without esophagitis: Secondary | ICD-10-CM | POA: Diagnosis not present

## 2023-04-07 DIAGNOSIS — Z681 Body mass index (BMI) 19 or less, adult: Secondary | ICD-10-CM | POA: Diagnosis not present

## 2023-04-07 DIAGNOSIS — B955 Unspecified streptococcus as the cause of diseases classified elsewhere: Secondary | ICD-10-CM

## 2023-04-07 DIAGNOSIS — I132 Hypertensive heart and chronic kidney disease with heart failure and with stage 5 chronic kidney disease, or end stage renal disease: Secondary | ICD-10-CM | POA: Diagnosis present

## 2023-04-07 DIAGNOSIS — I120 Hypertensive chronic kidney disease with stage 5 chronic kidney disease or end stage renal disease: Secondary | ICD-10-CM | POA: Diagnosis not present

## 2023-04-07 DIAGNOSIS — I3139 Other pericardial effusion (noninflammatory): Secondary | ICD-10-CM

## 2023-04-07 DIAGNOSIS — I700 Atherosclerosis of aorta: Secondary | ICD-10-CM | POA: Diagnosis not present

## 2023-04-07 DIAGNOSIS — I2510 Atherosclerotic heart disease of native coronary artery without angina pectoris: Secondary | ICD-10-CM | POA: Diagnosis present

## 2023-04-07 DIAGNOSIS — J189 Pneumonia, unspecified organism: Secondary | ICD-10-CM | POA: Diagnosis not present

## 2023-04-07 DIAGNOSIS — R319 Hematuria, unspecified: Secondary | ICD-10-CM | POA: Diagnosis not present

## 2023-04-07 DIAGNOSIS — R2681 Unsteadiness on feet: Secondary | ICD-10-CM | POA: Diagnosis not present

## 2023-04-07 DIAGNOSIS — N390 Urinary tract infection, site not specified: Secondary | ICD-10-CM | POA: Diagnosis not present

## 2023-04-07 DIAGNOSIS — B954 Other streptococcus as the cause of diseases classified elsewhere: Secondary | ICD-10-CM | POA: Diagnosis not present

## 2023-04-07 DIAGNOSIS — R627 Adult failure to thrive: Principal | ICD-10-CM

## 2023-04-07 DIAGNOSIS — G4700 Insomnia, unspecified: Secondary | ICD-10-CM | POA: Diagnosis not present

## 2023-04-07 DIAGNOSIS — D631 Anemia in chronic kidney disease: Secondary | ICD-10-CM | POA: Diagnosis not present

## 2023-04-07 DIAGNOSIS — R293 Abnormal posture: Secondary | ICD-10-CM | POA: Diagnosis not present

## 2023-04-07 DIAGNOSIS — I5032 Chronic diastolic (congestive) heart failure: Secondary | ICD-10-CM | POA: Diagnosis present

## 2023-04-07 DIAGNOSIS — E119 Type 2 diabetes mellitus without complications: Secondary | ICD-10-CM | POA: Diagnosis not present

## 2023-04-07 DIAGNOSIS — R0989 Other specified symptoms and signs involving the circulatory and respiratory systems: Secondary | ICD-10-CM | POA: Diagnosis not present

## 2023-04-07 DIAGNOSIS — F32A Depression, unspecified: Secondary | ICD-10-CM | POA: Diagnosis present

## 2023-04-07 DIAGNOSIS — F419 Anxiety disorder, unspecified: Secondary | ICD-10-CM | POA: Diagnosis present

## 2023-04-07 DIAGNOSIS — R1312 Dysphagia, oropharyngeal phase: Secondary | ICD-10-CM | POA: Diagnosis not present

## 2023-04-07 DIAGNOSIS — Z79899 Other long term (current) drug therapy: Secondary | ICD-10-CM | POA: Diagnosis not present

## 2023-04-07 DIAGNOSIS — R932 Abnormal findings on diagnostic imaging of liver and biliary tract: Secondary | ICD-10-CM | POA: Diagnosis not present

## 2023-04-07 DIAGNOSIS — A408 Other streptococcal sepsis: Secondary | ICD-10-CM | POA: Diagnosis present

## 2023-04-07 DIAGNOSIS — B370 Candidal stomatitis: Secondary | ICD-10-CM | POA: Diagnosis not present

## 2023-04-07 DIAGNOSIS — I959 Hypotension, unspecified: Secondary | ICD-10-CM | POA: Diagnosis not present

## 2023-04-07 DIAGNOSIS — Z741 Need for assistance with personal care: Secondary | ICD-10-CM | POA: Diagnosis not present

## 2023-04-07 DIAGNOSIS — J9621 Acute and chronic respiratory failure with hypoxia: Secondary | ICD-10-CM | POA: Diagnosis present

## 2023-04-07 DIAGNOSIS — E569 Vitamin deficiency, unspecified: Secondary | ICD-10-CM | POA: Diagnosis not present

## 2023-04-07 DIAGNOSIS — I129 Hypertensive chronic kidney disease with stage 1 through stage 4 chronic kidney disease, or unspecified chronic kidney disease: Secondary | ICD-10-CM | POA: Diagnosis not present

## 2023-04-07 LAB — BLOOD CULTURE ID PANEL (REFLEXED) - BCID2

## 2023-04-07 LAB — RESP PANEL BY RT-PCR (RSV, FLU A&B, COVID)  RVPGX2
Influenza A by PCR: NEGATIVE
Influenza B by PCR: NEGATIVE
Resp Syncytial Virus by PCR: NEGATIVE
SARS Coronavirus 2 by RT PCR: NEGATIVE

## 2023-04-07 LAB — COMPREHENSIVE METABOLIC PANEL
ALT: 17 [IU]/L (ref 0–32)
AST: 23 [IU]/L (ref 0–40)
Albumin: 3.8 g/dL (ref 3.7–4.7)
Alkaline Phosphatase: 275 [IU]/L — ABNORMAL HIGH (ref 44–121)
BUN/Creatinine Ratio: 9 — ABNORMAL LOW (ref 12–28)
BUN: 20 mg/dL (ref 8–27)
Bilirubin Total: 0.3 mg/dL (ref 0.0–1.2)
CO2: 28 mmol/L (ref 20–29)
Calcium: 10.3 mg/dL (ref 8.7–10.3)
Chloride: 97 mmol/L (ref 96–106)
Creatinine, Ser: 2.16 mg/dL — ABNORMAL HIGH (ref 0.57–1.00)
Globulin, Total: 2.8 g/dL (ref 1.5–4.5)
Glucose: 88 mg/dL (ref 70–99)
Potassium: 3.5 mmol/L (ref 3.5–5.2)
Sodium: 141 mmol/L (ref 134–144)
Total Protein: 6.6 g/dL (ref 6.0–8.5)
eGFR: 21 mL/min/{1.73_m2} — ABNORMAL LOW (ref >59)

## 2023-04-07 LAB — CBC
Hematocrit: 40.8 % (ref 34.0–46.6)
Hemoglobin: 12.5 g/dL (ref 11.1–15.9)
MCH: 27.5 pg (ref 26.6–33.0)
MCHC: 30.6 g/dL — ABNORMAL LOW (ref 31.5–35.7)
MCV: 90 fL (ref 79–97)
Platelets: 238 10*3/uL (ref 150–450)
RBC: 4.54 x10E6/uL (ref 3.77–5.28)
RDW: 14 % (ref 11.7–15.4)
WBC: 6.8 10*3/uL (ref 3.4–10.8)

## 2023-04-07 LAB — URINALYSIS, ROUTINE W REFLEX MICROSCOPIC
Bilirubin Urine: NEGATIVE
Glucose, UA: NEGATIVE mg/dL
Ketones, ur: NEGATIVE mg/dL
Nitrite: NEGATIVE
Protein, ur: 100 mg/dL — AB
RBC / HPF: >50 RBC/hpf (ref 0–5)
Specific Gravity, Urine: 1.013 (ref 1.005–1.030)
WBC, UA: >50 WBC/hpf (ref 0–5)
pH: 6 (ref 5.0–8.0)

## 2023-04-07 LAB — THYROID PROFILE II
Free Thyroxine Index: 1.5 (ref 1.2–4.9)
T3 Uptake Ratio: 23 % — ABNORMAL LOW (ref 24–39)
T3, Total: 93 ng/dL (ref 71–180)
T4, Total: 6.6 ug/dL (ref 4.5–12.0)
TSH: 2.78 u[IU]/mL (ref 0.450–4.500)

## 2023-04-07 LAB — STREP PNEUMONIAE URINARY ANTIGEN: Strep Pneumo Urinary Antigen: NEGATIVE

## 2023-04-07 LAB — PHOSPHORUS: Phosphorus: 3.6 mg/dL (ref 2.5–4.6)

## 2023-04-07 LAB — LACTIC ACID, PLASMA
Lactic Acid, Venous: 1.2 mmol/L (ref 0.5–1.9)
Lactic Acid, Venous: 1.8 mmol/L (ref 0.5–1.9)

## 2023-04-07 LAB — TROPONIN I (HIGH SENSITIVITY): Troponin I (High Sensitivity): 7 ng/L (ref <18)

## 2023-04-07 LAB — MAGNESIUM: Magnesium: 1.8 mg/dL (ref 1.7–2.4)

## 2023-04-07 LAB — LIPASE: Lipase: 24 U/L (ref 14–85)

## 2023-04-07 MED ORDER — PANTOPRAZOLE SODIUM 40 MG IV SOLR
40.0000 mg | Freq: Two times a day (BID) | INTRAVENOUS | Status: DC
  Administered 2023-04-07 – 2023-04-08 (×3): 40 mg via INTRAVENOUS
  Filled 2023-04-07 (×3): qty 10

## 2023-04-07 MED ORDER — SODIUM CHLORIDE 0.9 % IV SOLN
500.0000 mg | INTRAVENOUS | Status: DC
  Administered 2023-04-07 – 2023-04-08 (×2): 500 mg via INTRAVENOUS
  Filled 2023-04-07 (×2): qty 5

## 2023-04-07 MED ORDER — ALBUTEROL SULFATE (2.5 MG/3ML) 0.083% IN NEBU: 2.5000 mg | INHALATION_SOLUTION | RESPIRATORY_TRACT | Status: AC | PRN

## 2023-04-07 MED ORDER — POTASSIUM CHLORIDE 10 MEQ/100ML IV SOLN
10.0000 meq | Freq: Once | INTRAVENOUS | Status: AC
  Administered 2023-04-07: 10 meq via INTRAVENOUS
  Filled 2023-04-07: qty 100

## 2023-04-07 MED ORDER — ONDANSETRON HCL 4 MG/2ML IJ SOLN: 4.0000 mg | Freq: Three times a day (TID) | INTRAMUSCULAR | Status: DC | PRN

## 2023-04-07 MED ORDER — SODIUM CHLORIDE 0.9 % IV SOLN: 1.0000 g | INTRAVENOUS | Status: DC

## 2023-04-07 MED ORDER — ONDANSETRON HCL 4 MG PO TABS: 4.0000 mg | ORAL_TABLET | Freq: Four times a day (QID) | ORAL | Status: DC | PRN

## 2023-04-07 MED ORDER — MAGNESIUM SULFATE 2 GM/50ML IV SOLN
2.0000 g | Freq: Once | INTRAVENOUS | Status: AC
  Administered 2023-04-07: 2 g via INTRAVENOUS
  Filled 2023-04-07: qty 50

## 2023-04-07 MED ORDER — HYDROMORPHONE HCL 1 MG/ML IJ SOLN: 0.5000 mg | INTRAMUSCULAR | Status: AC | PRN

## 2023-04-07 MED ORDER — SODIUM CHLORIDE 0.9 % IV SOLN
1.0000 g | Freq: Once | INTRAVENOUS | Status: AC
  Administered 2023-04-07: 1 g via INTRAVENOUS
  Filled 2023-04-07: qty 10

## 2023-04-07 MED ORDER — ACETAMINOPHEN 325 MG PO TABS
650.0000 mg | ORAL_TABLET | Freq: Four times a day (QID) | ORAL | Status: DC | PRN
  Administered 2023-04-14: 650 mg via ORAL
  Filled 2023-04-07 (×2): qty 2

## 2023-04-07 MED ORDER — SODIUM CHLORIDE 0.9 % IV BOLUS
1000.0000 mL | Freq: Once | INTRAVENOUS | Status: AC
  Administered 2023-04-07: 1000 mL via INTRAVENOUS

## 2023-04-07 MED ORDER — FAMOTIDINE IN NACL 20-0.9 MG/50ML-% IV SOLN
20.0000 mg | Freq: Once | INTRAVENOUS | Status: AC
  Administered 2023-04-07: 20 mg via INTRAVENOUS
  Filled 2023-04-07: qty 50

## 2023-04-07 MED ORDER — HEPARIN SODIUM (PORCINE) 5000 UNIT/ML IJ SOLN
5000.0000 [IU] | Freq: Two times a day (BID) | INTRAMUSCULAR | Status: DC
Start: 2023-04-07 — End: 2023-04-18
  Administered 2023-04-07 – 2023-04-18 (×23): 5000 [IU] via SUBCUTANEOUS
  Filled 2023-04-07 (×24): qty 1

## 2023-04-07 MED ORDER — ONDANSETRON HCL 4 MG/2ML IJ SOLN: 4.0000 mg | Freq: Four times a day (QID) | INTRAMUSCULAR | Status: DC | PRN

## 2023-04-07 MED ORDER — SODIUM CHLORIDE 0.9 % IV SOLN
2.0000 g | INTRAVENOUS | Status: DC
  Administered 2023-04-08 – 2023-04-10 (×3): 2 g via INTRAVENOUS
  Filled 2023-04-07 (×4): qty 20

## 2023-04-07 NOTE — H&P (Addendum)
History and Physical    Patient: DELFA Stanley WUJ:[id number] DOB: [birth year] DOA: 04/07/2023 DOS: the patient was seen and examined on 04/07/2023 PCP: [name], MD  Patient coming from: Home  Chief Complaint:  Chief Complaint  Patient presents with   Failure To Thrive   HPI: [name] is a 89 y.o. female with medical history significant of multiple medical issues including HFpEF, ESRD on hemodialysis Monday Wednesday Friday, GERD, hypertension presenting with failure to thrive, pneumonia, gastritis.  Patient reports difficulty swallowing over the past 2 to 3 weeks.  Has been able to tolerate some food albeit with difficulty and pain.  No fevers or chills.  Minimal nausea.  No abdominal pain.  No shortness of breath.  Family reports significant weight loss over the past few months.  Was evaluated by PCP yesterday.  Some concern for left salivary gland enlargement with recommendation for ENT evaluation. Presented to the ER afebrile, hemodynamically stable.  Satting in the mid 90s on 1 to 2 L nasal cannula.  White count 6.8, hemoglobin 12.5, platelets 238, creatinine 2.2, alk phos 275, urinalysis indicative of infection.  COVID flu and RSV negative.  Chest is clear positive for right midlung pneumonia.  CT abdomen pelvis concerning for gastritis as well as cholelithiasis.[name]  Positive mild cardiomegaly. Review of Systems: As mentioned in the history of present illness. All other systems reviewed and are negative. Past Medical History:  Diagnosis Date   Acute heart failure (HCC)    Acute on chronic heart failure with preserved ejection fraction (HFpEF) (HCC) 06/05/2019   Anemia    Anemia due to stage 4 chronic kidney disease (HCC) 08/27/2019   Anxiety    CHF (congestive heart failure) (HCC)    Chronic kidney disease    stage V; end stage renal disease   Chronic kidney disease, stage 5 (HCC) 01/23/2019   Chronic low back pain    Depression    Diabetes mellitus without  complication (HCC)    Essential hypertension, malignant 05/24/2013   GERD (gastroesophageal reflux disease)    Hematoma 07/17/2020   Hyperlipidemia    Hypertension    MI (myocardial infarction) (HCC)    Obesity    Proteinuria 01/23/2019   Respiratory failure (HCC) 04/09/2017   SOB (shortness of breath) 05/24/2013   Unilateral inguinal hernia with obstruction and without gangrene    Volume overload 08/30/2019   Past Surgical History:  Procedure Laterality Date   A/V FISTULAGRAM Left 01/15/2020   Procedure: A/V FISTULAGRAM;  Surgeon: Renford Dills, MD;  Location: [facility] INVASIVE CV LAB;  Service: Cardiovascular;  Laterality: Left;   ABDOMINAL HYSTERECTOMY     APPENDECTOMY     AV FISTULA PLACEMENT Left 11/13/2019   Procedure: INSERTION OF ARTERIOVENOUS (AV) GORE-TEX GRAFT ARM ( BRACHIAL AXILLARY );  Surgeon: Renford Dills, MD;  Location: [facility] ORS;  Service: Vascular;  Laterality: Left;   DIALYSIS/PERMA CATHETER INSERTION N/A 06/07/2019   Procedure: DIALYSIS/PERMA CATHETER INSERTION;  Surgeon: Renford Dills, MD;  Location: [facility] INVASIVE CV LAB;  Service: Cardiovascular;  Laterality: N/A;   DIALYSIS/PERMA CATHETER REMOVAL N/A 02/11/2020   Procedure: DIALYSIS/PERMA CATHETER REMOVAL;  Surgeon: Renford Dills, MD;  Location: [facility] INV[facility]IVE CV [facility];  Service: Cardiovascular;  Laterality: N/A;   ESOPHAGOGASTRODUODENOSCOPY (EGD) WITH PROPOFOL N/A 11/30/2021   Procedure: ESOPHAGOGASTRODUODENOSCOPY (EGD) WITH PROPOFOL;  Surgeon: Toney Reil, MD;  Location[facility] ENDOSCOPY;  Service: Gastroenterology;  Laterality: N/A;   EYE SURGERY     HERNIA REPAIR  INCISION AND DRAINAGE PERIRECTAL ABSCESS     INGUINAL HERNIA REPAIR N/A 07/12/2020   Procedure: HERNIA REPAIR INGUINAL ADULT;  Surgeon: Henrene Dodge, MD;  Location: [facility] ORS;  Service: General;  Laterality: N/A;   KNEE SURGERY     THYROID SURGERY     VAGINAL HYSTERECTOMY     Social History:  reports that she has quit smoking. Her  smoking use included cigarettes. She has never used smokeless tobacco. She reports that she does not drink alcohol and does not use drugs.  Allergies  Allergen Reactions   Antihistamines, Chlorpheniramine-Type Other (See Comments)    Unknown reaction.   Other Other (See Comments)   Paxil [Paroxetine]     "makes her feel funny" not in a good way   Codeine Rash and Other (See Comments)    Mouth sore    Family History  Problem Relation Age of Onset   Heart attack Mother    Heart disease Sister    Cancer Sister     Prior to Admission medications   Medication Sig Start Date End Date Taking? Authorizing Provider  acetaminophen (TYLENOL) 500 MG tablet Take 500-1,000 mg by mouth every 6 (six) hours as needed for mild pain or fever.     [provider]  albuterol (VENTOLIN HFA) 108 (90 Base) MCG/ACT inhaler Inhale 2 puffs into the lungs every 6 (six) hours as needed for wheezing or shortness of breath. 02/02/23   Simmons-Robinson, Tawanna Cooler, MD  calcitRIOL (ROCALTROL) 0.25 MCG capsule Take 0.25 mcg by mouth every Monday, Wednesday, and Friday.  10/23/18   [provider]  carvedilol (COREG) 3.125 MG tablet Take 3.125 mg by mouth 2 (two) times daily. 11/12/21   [provider]  fluticasone (FLONASE) 50 MCG/ACT nasal spray SPRAY 1 SPRAY INTO BOTH NOSTRILS DAILY. 05/03/22   Alfredia Ferguson, PA-C  hydrOXYzine (ATARAX) 10 MG tablet Take 1 tablet (10 mg total) by mouth at bedtime as needed for anxiety. 04/06/23   Simmons-Robinson, Tawanna Cooler, MD  loratadine (CLARITIN) 10 MG tablet Take 1 tablet (10 mg total) by mouth daily. 08/30/22   Simmons-Robinson, Makiera, MD  multivitamin (RENA-VIT) TABS tablet Take 1 tablet by mouth daily.  09/13/19   [provider]  omeprazole (PRILOSEC) 40 MG capsule Take 1 capsule (40 mg total) by mouth daily. 11/30/21   Esaw Grandchild A, DO  OXYGEN Inhale 2 L into the lungs at bedtime.    [provider]  sertraline (ZOLOFT) 25 MG tablet  TAKE 1 TABLET BY MOUTH EVERY DAY 12/05/22   Simmons-Robinson, Tawanna Cooler, MD  torsemide (DEMADEX) 100 MG tablet TAKE 1 TABLET BY MOUTH EVERY DAY 02/22/23   Ostwalt, Edmon Crape, PA-C  traZODone (DESYREL) 150 MG tablet TAKE 1 TABLET BY MOUTH EVERYDAY AT BEDTIME 01/12/23   [name], MD    Physical Exam: Vitals:   04/06/23 2245 04/07/23 0352 04/07/23 0359 04/07/23 0600  BP: (!) 156/64 (!) 157/55  (!) 164/60  Pulse: 76 64  73  Resp: 18 12    Temp: 98.7 F (37.1 C) 98 F (36.7 C)    TempSrc: Oral Oral    SpO2: 94% 100% 100% 96%  Weight:      Height:       Physical Exam Constitutional:      Comments: Underweight   HENT:     Head: Normocephalic and atraumatic.     Nose: Nose normal.     Mouth/Throat:     Mouth: Mucous membranes are dry.  Eyes:  Pupils: Pupils are equal, round, and reactive to light.  Cardiovascular:     Rate and Rhythm: Normal rate and regular rhythm.  Pulmonary:     Effort: Pulmonary effort is normal.  Abdominal:     General: Bowel sounds are normal.     Comments: Mild R sided abd pain    Skin:    General: Skin is warm and dry.  Neurological:     General: No focal deficit present.  Psychiatric:        Mood and Affect: Mood normal.     Data Reviewed:  There are no new results to review at this time.  CT Renal Stone Study CLINICAL DATA:  Right flank pain with hematuria, severe weight loss and difficulty eating.  EXAM: CT ABDOMEN AND PELVIS WITHOUT CONTRAST  TECHNIQUE: Multidetector CT imaging of the abdomen and pelvis was performed following the standard protocol without IV contrast.  RADIATION DOSE REDUCTION: This exam was performed according to the departmental dose-optimization program which includes automated exposure control, adjustment of the mA and/or kV according to patient size and/or use of iterative reconstruction technique.  COMPARISON:  July 17, 2020  FINDINGS: Lower chest: There is mild cardiomegaly with a  predominantly stable moderate sized pericardial effusion.  Hepatobiliary: No focal liver abnormality is seen. Multiple gallstones are seen within a contracted gallbladder, without evidence of gallbladder wall thickening, pericholecystic inflammation or biliary dilatation.  Pancreas: Unremarkable. No pancreatic ductal dilatation or surrounding inflammatory changes.  Spleen: A stable 2.8 cm diameter partially calcified cyst is seen within the posterior aspect of the spleen.  Adrenals/Urinary Tract: Adrenal glands are unremarkable. The kidneys are mildly atrophic in size, without renal calculi, focal lesion, or hydronephrosis. A mild amount of air is seen within the lumen of the urinary bladder.  Stomach/Bowel: The stomach is poorly distended. Moderate to marked severity diffuse gastric wall thickening is noted. The appendix is surgically absent. No evidence of bowel dilatation. Numerous diverticula are seen scattered throughout the large bowel.  Vascular/Lymphatic: Aortic atherosclerosis. No enlarged abdominal or pelvic lymph nodes.  Reproductive: Status post hysterectomy. No adnexal masses.  Other: No abdominal wall hernia or abnormality. No abdominopelvic ascites.  Musculoskeletal: Diffusely sclerotic vertebral bodies are seen with marked severity multilevel degenerative changes noted throughout the thoracolumbar spine.  IMPRESSION: 1. Cholelithiasis. 2. Colonic diverticulosis. 3. Moderate to marked severity diffuse gastric wall thickening which may represent gastritis. Correlation with follow-up upper GI series or endoscopy is recommended. 4. Mild amount of air within the lumen of the urinary bladder which may be secondary to recent instrumentation. 5. Mild cardiomegaly with a predominantly stable moderate sized pericardial effusion. 6. Aortic atherosclerosis.  Aortic Atherosclerosis (ICD10-I70.0).  Electronically Signed   By: Aram Candela M.D.   On:  04/07/2023 00:10  Lab Results  Component Value Date   WBC 8.0 04/06/2023   HGB 12.3 04/06/2023   HCT 38.9 04/06/2023   MCV 89.4 04/06/2023   PLT 213 04/06/2023   Last metabolic panel Lab Results  Component Value Date   GLUCOSE 107 (H) 04/06/2023   NA 139 04/06/2023   K 2.6 (LL) 04/06/2023   CL 100 04/06/2023   CO2 30 04/06/2023   BUN 22 04/06/2023   CREATININE 2.36 (H) 04/06/2023   EGFR 21 (L) 04/06/2023   CALCIUM 9.1 04/06/2023   PHOS 1.7 (L) 07/20/2020   PROT 6.6 04/06/2023   ALBUMIN 3.8 04/06/2023   LABGLOB 2.8 04/06/2023   AGRATIO 1.7 09/21/2021   BILITOT 0.3 04/06/2023   ALKPHOS  275 (H) 04/06/2023   AST 23 04/06/2023   ALT 17 04/06/2023   ANIONGAP 9 04/06/2023    Assessment and Plan: UTI (urinary tract infection) Positive right-sided flank pain with urinalysis being indicative of infection IV Rocephin Urine culture Monitor  Failure to thrive in adult Progressive weight loss and decreased functional status over several weeks Suspect multifactorial with contributions of dysphagia as well as baseline chronic medical conditions including ESRD Plan for formal swallow evaluation Check prealbumin PT/OT evaluation    Dysphagia Noted progressive dysphagia over several weeks with noted enlarged salivary gland on outpatient evaluation pending ENT consultation CT imaging concerning for gastritis Will order CT soft tissue of neck in the interim  Will start patient on IV PPI therapy Speech evaluation Noted cholelithiasis- will check RUQ u/s to correlate  LFTs and t bili WNL  Will consult ENT  Consider GI evaluation as appropriate in setting of gastritis    Chronic diastolic CHF (congestive heart failure) (HCC) 2D echo 01/19/2022 with EF of 60 to 65% and grade 1 diastolic dysfunction Clinically dry euvolemic Dialysis dependent volume status Monitor  ESRD (end stage renal disease) (HCC) Baseline ESRD on hemodialysis Monday Wednesday Friday Due for dialysis  today Consult nephrology  Acute respiratory failure with hypoxia (HCC) Decompensated respiratory failure requiring 2 L nasal cannula with noted pneumonia on imaging IV Rocephin azithromycin Blood and respiratory cultures Continue supplemental oxygen Monitor  Essential hypertension BP stable Titrate regimen  Diabetes (HCC) Blood sugar in the 100s on presentation Monitor      Advance Care Planning:   Code Status: Full Code   Consults: None   Family Communication: No family at the bedside   Severity of Illness: The appropriate patient status for this patient is INPATIENT. Inpatient status is judged to be reasonable and necessary in order to provide the required intensity of service to ensure the patient's safety. The patient's presenting symptoms, physical exam findings, and initial radiographic and laboratory data in the context of their chronic comorbidities is felt to place them at high risk for further clinical deterioration. Furthermore, it is not anticipated that the patient will be medically stable for discharge from the hospital within 2 midnights of admission.   * I certify that at the point of admission it is my clinical judgment that the patient will require inpatient hospital care spanning beyond 2 midnights from the point of admission due to high intensity of service, high risk for further deterioration and high frequency of surveillance required.*  Author: Floydene Flock, MD 04/07/2023 8:37 AM  For on call review [email or web address].

## 2023-04-07 NOTE — Consult Note (Addendum)
PHARMACY CONSULT NOTE - ELECTROLYTES  Pharmacy Consult for Electrolyte Monitoring and Replacement   Recent Labs: Height: 5\' 3"  (160 cm) Weight: 36.5 kg (80 lb 6.4 oz) IBW/kg (Calculated) : 52.4 Estimated Creatinine Clearance: 9.3 mL/min (A) (by C-G formula based on SCr of 2.36 mg/dL (H)).  Potassium (mmol/L)  Date Value  04/06/2023 2.6 (LL)  05/15/2013 4.0   Magnesium (mg/dL)  Date Value  16/01/9603 1.8   Calcium (mg/dL)  Date Value  54/12/8117 9.1   Calcium, Total (mg/dL)  Date Value  14/78/2956 8.0 (L)   Albumin (g/dL)  Date Value  21/30/8657 3.8  05/11/2013 3.6   Phosphorus (mg/dL)  Date Value  84/69/6295 3.6   Sodium (mmol/L)  Date Value  04/06/2023 139  04/06/2023 141  05/15/2013 132 (L)   Assessment  Lori Stanley is a 89 y.o. female presenting with failure to thrive. Pharmacy has been consulted to monitor and replace electrolytes.  Diet: Dysphagia  MIVF: None  Pertinent medications: None   Goal of Therapy: Electrolytes WNL  Plan:  Mag 1.8 - will order magnesium 2 gm IV x 1  Most recent K+ is 3.5 from CMP this morning  Check BMP, Mg, Phos with AM labs  Thank you for allowing pharmacy to be a part of this patient's care.  Littie Deeds, PharmD Pharmacy Resident  04/07/2023 12:55 PM

## 2023-04-07 NOTE — Assessment & Plan Note (Addendum)
Noted progressive dysphagia over several weeks with noted enlarged salivary gland on outpatient evaluation pending ENT consultation CT imaging concerning for gastritis Will order CT soft tissue of neck in the interim  Will start patient on IV PPI therapy Speech evaluation Noted cholelithiasis- will check RUQ u/s to correlate  LFTs and t bili WNL  Will consult ENT  Consider GI evaluation as appropriate in setting of gastritis

## 2023-04-07 NOTE — Evaluation (Signed)
Occupational Therapy Evaluation Patient Details Name: Lori Stanley MRN: [id number] DOB: 05-18-1931 Today's Date: 04/07/2023   History of Present Illness 89 y.o. female with PMHx of HFpEF, ESRD on hemodialysis Monday Wednesday Friday, GERD, hypertension presenting with failure to thrive, pneumonia, gastritis, Cholelithiasis and Colonic diverticulosis and UTI.   Clinical Impression   Pt was seen for OT evaluation this date. Prior to hospital admission, pt lives alone in a 1 level home with ramp to enter. Reports Mod I with mobility using RW and for ADLs. Performs some IADLs and has family staying with her some and checking in as needed to bring meals and drive her to all appts.   Pt presents to acute OT demonstrating impaired ADL performance and functional mobility 2/2 weakness, low activity tolerance and pain (See OT problem list for additional functional deficits). Alert and oriented. Reports pain to R flank during movement, none at rest. Pt currently requires SUP for bed mobility and STS. SBA for mobility from bed<>toilet in ED room. Placed on RA for eval with lowest reading of 87% with quick increase WFL. Replaced 2L [place] once returned to supine. Grip weakness bilaterally and reports her hands will give out during bathing at home. Pt would benefit from skilled OT services to address noted impairments and functional limitations (see below for any additional details) in order to maximize safety and independence while minimizing falls risk and caregiver burden. Recommend HHOT services with SUP and assist from family as needed.       If plan is discharge home, recommend the following: A little help with walking and/or transfers;A little help with bathing/dressing/bathroom;Help with stairs or ramp for entrance;Assist for transportation;Assistance with cooking/housework    Functional Status Assessment  Patient has had a recent decline in their functional status and demonstrates the ability to make  significant improvements in function in a reasonable and predictable amount of time.  Equipment Recommendations  None recommended by OT    Recommendations for Other Services       Precautions / Restrictions Precautions Precautions: Fall Restrictions Weight Bearing Restrictions: No      Mobility Bed Mobility Overal bed mobility: Needs Assistance Bed Mobility: Supine to Sit, Sit to Supine     Supine to sit: Supervision Sit to supine: Supervision   General bed mobility comments: SUP from high gurney in ED    Transfers Overall transfer level: Needs assistance Equipment used: Rolling walker (2 wheels) Transfers: Sit to/from Stand Sit to Stand: Supervision, From elevated surface           General transfer comment: SUP for STS from high gurney; SBA for mobility to toilet and back to bed using RW with no LOB and able to stand at bedside for chuck pad to be changed with SUP at RW      Balance Overall balance assessment: Needs assistance   Sitting balance-Leahy Scale: Good     Standing balance support: Bilateral upper extremity supported, Reliant on assistive device for balance, During functional activity Standing balance-Leahy Scale: Fair Standing balance comment: fair to good standing balance with no LOB noted during mobility and transfer during eval                           ADL either performed or assessed with clinical judgement   ADL Overall ADL's : Needs assistance/impaired Eating/Feeding: Set up;Bed level  Toilet Transfer: Software engineer Details (indicate cue type and reason): CGA for toilet transfer and SBA for mobility from bed<>toilet                 Vision         Perception         Praxis         Pertinent Vitals/Pain Pain Assessment Pain Assessment: Faces Faces Pain Scale: Hurts even more (with movement, none at rest) Pain Location: R flank pain Pain Descriptors /  Indicators: Aching Pain Intervention(s): Monitored during session, Limited activity within patient's tolerance     Extremity/Trunk Assessment Upper Extremity Assessment Upper Extremity Assessment: Generalized weakness (weak grip strength bilaterally with pt reporting they fatigue easily when bathing)   Lower Extremity Assessment Lower Extremity Assessment: Generalized weakness       Communication Communication Communication: No apparent difficulties Cueing Techniques: Verbal cues   Cognition Arousal: Alert Behavior During Therapy: WFL for tasks assessed/performed Overall Cognitive Status: Within Functional Limits for tasks assessed                                       General Comments  placed on RA for session; noted 87% at lowest with increased to 100% back in bed replaced 2L upon return to bed    Exercises Other Exercises Other Exercises: Edu in role of OT in acute setting and importance of therapy to maximize safety/IND to return to PLOF.   Shoulder Instructions      Home Living Family/patient expects to be discharged to:: Private residence Living Arrangements: Alone Available Help at Discharge: Family;Available PRN/intermittently (reports they do stay with her from time to time) Type of Home: House Home Access: Ramped entrance     Home Layout: One level     Bathroom Shower/Tub: Sponge bathes at baseline   Bathroom Toilet: Handicapped height (BSC over toilet)     Home Equipment: Agricultural consultant (2 wheels);BSC/3in1          Prior Functioning/Environment               Mobility Comments: MOD I with RW for indoor/outdoor mobility ADLs Comments: IND with ADLs, simple IADLs; family drives to appts, brings food in, stays with her at time as well-niece, nephew and sister all assist        OT Problem List: Pain;Decreased strength      OT Treatment/Interventions: Self-care/ADL training;Therapeutic exercise;Therapeutic activities;DME and/or  AE instruction;Patient/family education;Balance training    OT Goals(Current goals can be found in the care plan section) Acute Rehab OT Goals Patient Stated Goal: return home OT Goal Formulation: With patient Time For Goal Achievement: 04/21/23 Potential to Achieve Goals: Good ADL Goals Pt Will Perform Lower Body Bathing: with supervision;sitting/lateral leans;sit to/from stand Pt Will Perform Lower Body Dressing: with supervision;sitting/lateral leans;sit to/from stand Pt Will Transfer to Toilet: with supervision;ambulating;bedside commode;grab bars Pt Will Perform Toileting - Clothing Manipulation and hygiene: with supervision;sit to/from stand;sitting/lateral leans Pt/caregiver will Perform Home Exercise Program: Both right and left upper extremity;With theraputty;With Supervision;With written HEP provided  OT Frequency: Min 1X/week    Co-evaluation              AM-PAC OT "6 Clicks" Daily Activity     Outcome Measure Help from another person eating meals?: None Help from another person taking care of personal grooming?: None Help from another person toileting, which includes  using toliet, bedpan, or urinal?: A Little Help from another person bathing (including washing, rinsing, drying)?: A Little Help from another person to put on and taking off regular upper body clothing?: None Help from another person to put on and taking off regular lower body clothing?: A Little 6 Click Score: 21   End of Session Equipment Utilized During Treatment: Rolling walker (2 wheels) Nurse Communication: Mobility status  Activity Tolerance: Patient tolerated treatment well Patient left: in bed;with call bell/phone within reach  OT Visit Diagnosis: Other abnormalities of gait and mobility (R26.89)                Time: 1610-9604 OT Time Calculation (min): 28 min Charges:  OT General Charges $OT Visit: 1 Visit OT Evaluation $OT Eval Low Complexity: 1 Low OT Treatments $Self Care/Home  Management : 8-22 mins Zanita Millman, OTR/L 04/07/23, 3:47 PM  Hatem Cull E Lorriann Hansmann 04/07/2023, 3:44 PM

## 2023-04-07 NOTE — Assessment & Plan Note (Signed)
Longstanding uncomplicated diabetes mellitus, supportive care  Last reported hemoglobin A1c of 5.0% in 2023

## 2023-04-07 NOTE — Consult Note (Signed)
Miller, Reffner [id number] [birth year] Floydene Flock, MD  Reason for Consult: Dysphagia, dysphonia, "gland swelling"  HPI: 89 year old female admitted for failure to thrive and flank pain.  Also there was concern for sore throat and dysphagia as well as "gland swelling" per PCP.  Patient has vague symptoms of sore throat over the last week.  Evaluated by speech therapy and placed on a pureed diet.  Asked to evaluate patient due to dysphagia.  Patient reports left sided neck pain previously but denies any significant pain in her throat.  Reports a voice change as well.  Urgent ENT referral placed for evaluation.  Allergies:  Allergies  Allergen Reactions   Antihistamines, Chlorpheniramine-Type Other (See Comments)    Unknown reaction.   Other Other (See Comments)   Paxil [Paroxetine]     "makes her feel funny" not in a good way   Codeine Rash and Other (See Comments)    Mouth sore    ROS: Review of systems normal other than 12 systems except per HPI.  PMH:  Past Medical History:  Diagnosis Date   Acute heart failure (HCC)    Acute on chronic heart failure with preserved ejection fraction (HFpEF) (HCC) 06/05/2019   Anemia    Anemia due to stage 4 chronic kidney disease (HCC) 08/27/2019   Anxiety    CHF (congestive heart failure) (HCC)    Chronic kidney disease    stage V; end stage renal disease   Chronic kidney disease, stage 5 (HCC) 01/23/2019   Chronic low back pain    Depression    Diabetes mellitus without complication (HCC)    Essential hypertension, malignant 05/24/2013   GERD (gastroesophageal reflux disease)    Hematoma 07/17/2020   Hyperlipidemia    Hypertension    MI (myocardial infarction) (HCC)    Obesity    Proteinuria 01/23/2019   Respiratory failure (HCC) 04/09/2017   SOB (shortness of breath) 05/24/2013   Unilateral inguinal hernia with obstruction and without gangrene    Volume overload 08/30/2019    FH:  Family History  Problem Relation Age of Onset    Heart attack Mother    Heart disease Sister    Cancer Sister     SH:  Social History   Socioeconomic History   Marital status: Widowed    Spouse name: Not on file   Number of children: 1   Years of education: Not on file   Highest education level: 11th grade  Occupational History   Occupation: retired  Tobacco Use   Smoking status: Former    Types: Cigarettes   Smokeless tobacco: Never   Tobacco comments:    Quit in 2015-2016  Vaping Use   Vaping status: Never Used  Substance and Sexual Activity   Alcohol use: No   Drug use: No   Sexual activity: Never  Other Topics Concern   Not on file  Social History Narrative   Lives in [place]; self; friends do chores; quit smoking many years; no alcohol. Worked in [employer].    Social Determinants of Health   Financial Resource Strain: Low Risk  (11/01/2022)   Overall Financial Resource Strain (CARDIA)    Difficulty of Paying Living Expenses: Not hard at all  Food Insecurity: No Food Insecurity (11/01/2022)   Hunger Vital Sign    Worried About Running Out of Food in the Last Year: Never true    Ran Out of Food in the Last Year: Never true  Transportation Needs: No Transportation Needs (  11/01/2022)   PRAPARE - Administrator, Civil Service (Medical): No    Lack of Transportation (Non-Medical): No  Physical Activity: Inactive (11/01/2022)   Exercise Vital Sign    Days of Exercise per Week: 0 days    Minutes of Exercise per Session: 0 min  Stress: No Stress Concern Present (11/01/2022)   [employer] of Occupational Health - Occupational Stress Questionnaire    Feeling of Stress : Not at all  Social Connections: Socially Isolated (11/01/2022)   Social Connection and Isolation Panel [NHANES]    Frequency of Communication with Friends and Family: More than three times a week    Frequency of Social Gatherings with Friends and Family: Once a week    Attends Religious Services: Never    Database administrator or  Organizations: No    Attends Banker Meetings: Never    Marital Status: Widowed  Intimate Partner Violence: Not At Risk (11/01/2022)   Humiliation, Afraid, Rape, and Kick questionnaire    Fear of Current or Ex-Partner: No    Emotionally Abused: No    Physically Abused: No    Sexually Abused: No    PSH:  Past Surgical History:  Procedure Laterality Date   A/V FISTULAGRAM Left 01/15/2020   Procedure: A/V FISTULAGRAM;  Surgeon: Renford Dills, MD;  Location: [facility];  Service: Cardiovascular;  Laterality: Left;   ABDOMINAL HYSTERECTOMY     APPENDECTOMY     AV FISTULA PLACEMENT Left 11/13/2019   Procedure: INSERTION OF ARTERIOVENOUS (AV) GORE-TEX GRAFT ARM ( BRACHIAL AXILLARY );  Surgeon: Renford Dills, MD;  Location: [facility];  Service: Vascular;  Laterality: Left;   DIALYSIS/PERMA CATHETER INSERTION N/A 06/07/2019   Procedure: DIALYSIS/PERMA CATHETER INSERTION;  Surgeon: Renford Dills, MD;  Location: [facility];  Service: Cardiovascular;  Laterality: N/A;   DIALYSIS/PERMA CATHETER REMOVAL N/A 02/11/2020   Procedure: DIALYSIS/PERMA CATHETER REMOVAL;  Surgeon: Renford Dills, MD;  Location: [facility];  Service: Cardiovascular;  Laterality: N/A;   ESOPHAGOGASTRODUODENOSCOPY (EGD) WITH PROPOFOL N/A 11/30/2021   Procedure: ESOPHAGOGASTRODUODENOSCOPY (EGD) WITH PROPOFOL;  Surgeon: Toney Reil, MD;  Location: [facility];  Service: Gastroenterology;  Laterality: N/A;   EYE SURGERY     HERNIA REPAIR     INCISION AND DRAINAGE PERIRECTAL ABSCESS     INGUINAL HERNIA REPAIR N/A 07/12/2020   Procedure: HERNIA REPAIR INGUINAL ADULT;  Surgeon: Henrene Dodge, MD;  Location: [facility];  Service: General;  Laterality: N/A;   KNEE SURGERY     THYROID SURGERY     VAGINAL HYSTERECTOMY      Physical  Exam:  GEN-  NAD, supine in bed with head elevated eating dinner NEURO-  CN 2-12 grossly intact and symmetric. EARS-  external ears  clear NOSE- clear anteriorly, Nasal canula in place OC/OP-  very poor dentition, no abnormal masses or lesions, posterior oropharynx easily evalauted NECK-  very kyphotic and anterior placement with chin resting on anterior sternum at baseline.  No abnormal glandular enlargment.  Area of concern where patient points to is her thyroid cartilage.  No masses or lesions palpated. No induration. RESP- unlabored CARD  RRR  CT:  IMPRESSION: 1. Evaluation is limited by the absence of intravenous contrast and patient positioning. Within this limitation, no acute finding in the neck. Poor visualization of the submandibular glands but no definite mass or stone is seen. If there is persistent concern, consider ultrasound or contrasted neck CT. 2.  Advanced degenerative changes in the shoulders, with bony erosion in both glenohumeral joints and a right glenohumeral joint effusion with subacromial/subdeltoid bursal fluid. CT cannot determine whether this fluid is inflammatory or infected. 3. Advanced degenerative changes in the cervical spine, with vertebral body height loss at C7 and T1, which appears chronic. 4. Severe coronary and aortic atherosclerotic calcifications.  Procedure:  Trans-nasal flexible laryngoscopy:  Pre-procedure diagnosis:  dysphagia, dysphonia.  Post-procedure diagnosis:  same.  Description of procedure:  After verbal consent obtained, combination of 4% lidocaine and 0.5% phenylephrine 0.55ml was sprayed into the patinet's left nostril.  A flexible fiberoptic laryngoscope was next gently inserted for visualization of the patient's nasopharynx, pharynx and larynx.  This demonstrated no abnormality in the nasopharynx.  The patient's base of tongue/valleculae were difficulty to visualize due to severe anterior neck flexion.  The true vocal folds were visualized and noted to be mobile and normal in appearance.  Mild edema was present in the supraglottis.   A/P: Dysphagia and dysphonia in a  patient with severe anterior neck flexion and cervical spine disease.  I do not think there is any glandular infection or enlargement where the is an infection.  She did have some mild edema on laryngoscopy and could consider IV steroids to see if that helps some.  I think most of patient's symptoms are going to be related to her posture and cervical spine disease.  Could consider a modified barium swallow study for further evaluation.  No role for any biopsy as no abnormal swelling on CT nor exam.     Bud Face 04/07/2023 6:05 PM

## 2023-04-07 NOTE — Evaluation (Signed)
Clinical/Bedside Swallow Evaluation Patient Details  Name: Lori Stanley MRN: [id number] Date of Birth: 05-05-1931  Today's Date: 04/07/2023 Time: SLP Start Time (ACUTE ONLY): 1220 SLP Stop Time (ACUTE ONLY): 1241 SLP Time Calculation (min) (ACUTE ONLY): 21 min  Past Medical History:  Past Medical History:  Diagnosis Date   Acute heart failure (HCC)    Acute on chronic heart failure with preserved ejection fraction (HFpEF) (HCC) 06/05/2019   Anemia    Anemia due to stage 4 chronic kidney disease (HCC) 08/27/2019   Anxiety    CHF (congestive heart failure) (HCC)    Chronic kidney disease    stage V; end stage renal disease   Chronic kidney disease, stage 5 (HCC) 01/23/2019   Chronic low back pain    Depression    Diabetes mellitus without complication (HCC)    Essential hypertension, malignant 05/24/2013   GERD (gastroesophageal reflux disease)    Hematoma 07/17/2020   Hyperlipidemia    Hypertension    MI (myocardial infarction) (HCC)    Obesity    Proteinuria 01/23/2019   Respiratory failure (HCC) 04/09/2017   SOB (shortness of breath) 05/24/2013   Unilateral inguinal hernia with obstruction and without gangrene    Volume overload 08/30/2019   Past Surgical History:  Past Surgical History:  Procedure Laterality Date   A/V FISTULAGRAM Left 01/15/2020   Procedure: A/V FISTULAGRAM;  Surgeon: Renford Dills, MD;  Location: [facility] INVASIVE CV [facility];  Service: Cardiovascular;  Laterality: Left;   ABDOMINAL HYSTERECTOMY     APPENDECTOMY     AV FISTULA PLACEMENT Left 11/13/2019   Procedure: INSERTION OF ARTERIOVENOUS (AV) GORE-TEX GRAFT ARM ( BRACHIAL AXILLARY );  Surgeon: Renford Dills, MD;  Location: [facility] ORS;  Service: Vascular;  Laterality: Left;   DIALYSIS/PERMA CATHETER INSERTION N/A 06/07/2019   Procedure: DIALYSIS/PERMA CATHETER INSERTION;  Surgeon: Renford Dills, MD;  Location: [facility] IN[facility] CV [facility];  Service: Cardiovascular;  Laterality: N/A;   DIALYSIS/PERMA CATHETER  REMOVAL N/A 02/11/2020   Procedure: DIALYSIS/PERMA CATHETER REMOVAL;  Surgeon: Renford Dills, MD;  Location: [facility];  Service: Cardiovascular;  Laterality: N/A;   ESOPHAGOGASTRODUODENOSCOPY (EGD) WITH PROPOFOL N/A 11/30/2021   Procedure: ESOPHAGOGASTRODUODENOSCOPY (EGD) WITH PROPOFOL;  Surgeon: Toney Reil, MD;  Location: [facility] ENDOSCOPY;  Service: Gastroenterology;  Laterality: N/A;   EYE SURGERY     HERNIA REPAIR     INCISION AND DRAINAGE PERIRECTAL ABSCESS     INGUINAL HERNIA REPAIR N/A 07/12/2020   Procedure: HERNIA REPAIR INGUINAL ADULT;  Surgeon: Henrene Dodge, MD;  Location: [facility] ORS;  Service: General;  Laterality: N/A;   KNEE SURGERY     THYROID SURGERY     VAGINAL HYSTERECTOMY     HPI:  Pt is a 89 year old femaile with sore throat reported during appt with PCP on 02/02/2023, negative strep. 04/06/2023 appt with PCP reported "Dysphagia Difficulty swallowing on the left side with associated weight loss and malnutrition. Possible glandular enlargement or nodule under the left jaw. Differential includes glandular enlargement, salivary gland issues, or tumor. Emphasized the importance of addressing malnutrition and potential risks if untreated. - Submit urgent referral to ENT for further evaluation and possible biopsy - Recommend acetaminophen for pain management - Advise warm tea with honey to soothe throat." Pt sent to ED for evaluation of hypertension, ESRD, severe weight loss and difficulty eating. "I am concerned that she may need IV nutrition." Chest is clear positive for right midlung pneumonia.  CT abdomen pelvis concerning for gastritis  as well as cholelithiasis.[name]  Positive mild cardiomegaly. CT soft tissue neck ono 04/07/2023 revealed 1. Evaluation is limited by the absence of intravenous contrast and patient positioning. Within this limitation, no acute finding in the neck. Poor visualization of the submandibular glands but no definite mass or stone is seen. If  there is persistent concern, consider ultrasound or contrasted neck CT.    Assessment / Plan / Recommendation  Clinical Impression  Given recent referral to ENT and weight loss related to throat pain, this wrtier requested ENT consult.   At bedside, pt reports sore throat that is worse when swallowing, she is unable to rate pain level. During brief oral inspection pt doesn't appear with any visible thrush. She also reports "something that feels like a knot, right here (feeling under the right angle of mandible) that prevents my food or liquid from going down, sometimes it comes back up."   When consuming thin liquids via cup and straw pt presents with the appearance of a swift swallow response and not overt s/s of aspiration. She occasionally frowned indicating "some pain." Pt's lunch tray was present during this evaluation and pt stated she wouldn't be at chew the chicken breast or the diced peaches "if they were hard." because she didn't "have the teeth" to chew them. Pt was agreeable to downgraded diet of dysphagia 2 with thin liquids for ease of chewing. At this time, pt appears at reduced risk of aspiration. ST services will sign off.  SLP Visit Diagnosis: Dysphagia, unspecified (R13.10)    Aspiration Risk  No limitations    Diet Recommendation Dysphagia 2 (Fine chop);Thin liquid    Liquid Administration via: Cup;Straw Medication Administration: Whole meds with liquid Supervision: Patient able to self feed;Intermittent supervision to cue for compensatory strategies Compensations: Minimize environmental distractions;Slow rate;Small sips/bites Postural Changes: Seated upright at 90 degrees;Remain upright for at least 30 minutes after po intake    Other  Recommendations Recommended Consults: Consider ENT evaluation Oral Care Recommendations: Oral care BID    Recommendations for follow up therapy are one component of a multi-disciplinary discharge planning process, led by the attending  physician.  Recommendations may be updated based on patient status, additional functional criteria and insurance authorization.  Follow up Recommendations No SLP follow up      Assistance Recommended at Discharge  TBD  Functional Status Assessment Patient has not had a recent decline in their functional status  Frequency and Duration   TBD         Prognosis   TBD     Swallow Study   General Date of Onset: 04/06/23 HPI: Pt is a 89 year old femaile with sore throat reported during appt with PCP on 02/02/2023, negative strep. 04/06/2023 appt with PCP reported "Dysphagia Difficulty swallowing on the left side with associated weight loss and malnutrition. Possible glandular enlargement or nodule under the left jaw. Differential includes glandular enlargement, salivary gland issues, or tumor. Emphasized the importance of addressing malnutrition and potential risks if untreated. - Submit urgent referral to ENT for further evaluation and possible biopsy - Recommend acetaminophen for pain management - Advise warm tea with honey to soothe throat." Pt sent to ED for evaluation of hypertension, ESRD, severe weight loss and difficulty eating. "I am concerned that she may need IV nutrition." Chest is clear positive for right midlung pneumonia.  CT abdomen pelvis concerning for gastritis as well as cholelithiasis.[name]  Positive mild cardiomegaly. CT soft tissue neck ono 04/07/2023 revealed 1. Evaluation is limited by  the absence of intravenous contrast and patient positioning. Within this limitation, no acute finding in the neck. Poor visualization of the submandibular glands but no definite mass or stone is seen. If there is persistent concern, consider ultrasound or contrasted neck CT. Type of Study: Bedside Swallow Evaluation Previous Swallow Assessment: none in chart Diet Prior to this Study: Regular;Thin liquids (Level 0) Temperature Spikes Noted: No Respiratory Status: Nasal cannula History of Recent  Intubation: No Behavior/Cognition: Alert;Cooperative;Pleasant mood Oral Cavity Assessment: Within Functional Limits Oral Care Completed by SLP: Recent completion by staff Oral Cavity - Dentition: Poor condition;Missing dentition Vision: Functional for self-feeding Self-Feeding Abilities: Needs assist;Needs set up Patient Positioning: Upright in bed Baseline Vocal Quality: Hoarse Volitional Cough: Strong;Weak Volitional Swallow: Able to elicit    Oral/Motor/Sensory Function Overall Oral Motor/Sensory Function: Within functional limits   Ice Chips Ice chips: Not tested   Thin Liquid Thin Liquid: Within functional limits Presentation: Cup;Self Fed;Straw    Nectar Thick Nectar Thick Liquid: Not tested   Honey Thick Honey Thick Liquid: Not tested   Puree Puree: Within functional limits Presentation: Self Fed;Spoon   Solid     Solid: Within functional limits Presentation: Self Fed Other Comments: soft solids assessed as pt stated "I won't be able to chew that" while pointing to the chicken breast, "I don't have the teeth to chew that"     Lori Stanley B. Dreama Saa, M.S., CCC-SLP, Tree surgeon Certified Brain Injury Specialist [facility]  [facility] Rehabilitation Services Office [phone] Ascom [phone] Fax [phone]

## 2023-04-07 NOTE — ED Provider Notes (Signed)
[facility] Provider Note    Event Date/Time   First MD Initiated Contact with Patient 04/07/23 [phone]     (approximate)   History   Failure To Thrive   HPI  Lori Stanley is a 89 y.o. female  who presents to the ED via EMS from home at the insistence of her PCP who she saw yesterday.  Per PCP note, she is "recommending that she be evaluated in the ED due to elevated blood pressure, ESRD, severe weight loss and difficulty eating. I am concerned that she may need IV nutrition as well as imaging for the blood on her urine sample today and right flank pain with increased risk of developing infection."  She was seen for sore throat/dysphagia with ENT referral and right flank pain exacerbated by sitting.  Room air sats 88% on room air, placed on 2 L nasal cannula oxygen.  Patient states she wears oxygen sometimes at home but not all the time.        Past Medical History   Past Medical History:  Diagnosis Date   Acute heart failure (HCC)    Acute on chronic heart failure with preserved ejection fraction (HFpEF) (HCC) 06/05/2019   Anemia    Anemia due to stage 4 chronic kidney disease (HCC) 08/27/2019   Anxiety    CHF (congestive heart failure) (HCC)    Chronic kidney disease    stage V; end stage renal disease   Chronic kidney disease, stage 5 (HCC) 01/23/2019   Chronic low back pain    Depression    Diabetes mellitus without complication (HCC)    Essential hypertension, malignant 05/24/2013   GERD (gastroesophageal reflux disease)    Hematoma 07/17/2020   Hyperlipidemia    Hypertension    MI (myocardial infarction) (HCC)    Obesity    Proteinuria 01/23/2019   Respiratory failure (HCC) 04/09/2017   SOB (shortness of breath) 05/24/2013   Unilateral inguinal hernia with obstruction and without gangrene    Volume overload 08/30/2019     Active Problem List   Patient Active Problem List   Diagnosis Date Noted   Enlarged salivary gland 04/06/2023    Severe protein-calorie malnutrition (HCC) 04/06/2023   Weight loss, non-intentional 04/06/2023   Encounter for annual wellness visit (AWV) in Medicare patient 02/02/2023   Memory change 01/28/2022   Primary osteoarthritis of both knees 01/27/2022   ABLA (acute blood loss anemia) 11/27/2021   GI bleed 11/26/2021   Chronic diastolic CHF (congestive heart failure) (HCC) 07/11/2020   Incarcerated left inguinal hernia 07/11/2020   SBO (small bowel obstruction) (HCC)    Anemia due to stage 4 chronic kidney disease (HCC) 08/27/2019   ESRD (end stage renal disease) (HCC)    Anemia in chronic kidney disease 01/23/2019   Secondary hyperparathyroidism of renal origin (HCC) 01/23/2019   Syncopal episodes 12/15/2015   Anemia of chronic disease 04/14/2015   Anxiety 04/14/2015   Clinical depression 04/14/2015   Essential hypertension 04/14/2015   Arthritis, degenerative 04/14/2015   Allergic rhinitis 04/14/2015   Hyperlipidemia 02/27/2015   Glaucoma 12/23/2014   Diabetes (HCC) 12/23/2014   GERD (gastroesophageal reflux disease) 12/23/2014     Past Surgical History   Past Surgical History:  Procedure Laterality Date   A/V FISTULAGRAM Left 01/15/2020   Procedure: A/V FISTULAGRAM;  Surgeon: Renford Dills, MD;  Location: [facility] INVASIVE CV LAB;  Service: Cardiovascular;  Laterality: Left;   ABDOMINAL HYSTERECTOMY     APPENDECTOMY  AV FISTULA PLACEMENT Left 11/13/2019   Procedure: INSERTION OF ARTERIOVENOUS (AV) GORE-TEX GRAFT ARM ( BRACHIAL AXILLARY );  Surgeon: Renford Dills, MD;  Location: [facility] ORS;  Service: Vascular;  Laterality: Left;   DIALYSIS/PERMA CATHETER INSERTION N/A 06/07/2019   Procedure: DIALYSIS/PERMA CATHETER INSERTION;  Surgeon: Renford Dills, MD;  Location: [facility] INVASIVE CV LAB;  Service: Cardiovascular;  Laterality: N/A;   DIALYSIS/PERMA CATHETER REMOVAL N/A 02/11/2020   Procedure: DIALYSIS/PERMA CATHETER REMOVAL;  Surgeon: Renford Dills, MD;  Location: [facility]  INVASIVE CV LAB;  Service: Cardiovascular;  Laterality: N/A;   ESOPHAGOGASTRODUODENOSCOPY (EGD) WITH PROPOFOL N/A 11/30/2021   Procedure: ESOPHAGOGASTRODUODENOSCOPY (EGD) WITH PROPOFOL;  Surgeon: Toney Reil, MD;  Location: [facility] ENDOSCOPY;  Service: Gastroenterology;  Laterality: N/A;   EYE SURGERY     HERNIA REPAIR     INCISION AND DRAINAGE PERIRECTAL ABSCESS     INGUINAL HERNIA REPAIR N/A 07/12/2020   Procedure: HERNIA REPAIR INGUINAL ADULT;  Surgeon: Henrene Dodge, MD;  Location: [facility] ORS;  Service: General;  Laterality: N/A;   KNEE SURGERY     THYROID SURGERY     VAGINAL HYSTERECTOMY       Home Medications   Prior to Admission medications   Medication Sig Start Date End Date Taking? Authorizing Provider  acetaminophen (TYLENOL) 500 MG tablet Take 500-1,000 mg by mouth every 6 (six) hours as needed for mild pain or fever.     [provider]  albuterol (VENTOLIN HFA) 108 (90 Base) MCG/ACT inhaler Inhale 2 puffs into the lungs every 6 (six) hours as needed for wheezing or shortness of breath. 02/02/23   Simmons-Robinson, Tawanna Cooler, MD  calcitRIOL (ROCALTROL) 0.25 MCG capsule Take 0.25 mcg by mouth every Monday, Wednesday, and Friday.  10/23/18   [provider]  carvedilol (COREG) 3.125 MG tablet Take 3.125 mg by mouth 2 (two) times daily. 11/12/21   [provider]  fluticasone (FLONASE) 50 MCG/ACT nasal spray SPRAY 1 SPRAY INTO BOTH NOSTRILS DAILY. 05/03/22   Alfredia Ferguson, PA-C  hydrOXYzine (ATARAX) 10 MG tablet Take 1 tablet (10 mg total) by mouth at bedtime as needed for anxiety. 04/06/23   Simmons-Robinson, Tawanna Cooler, MD  loratadine (CLARITIN) 10 MG tablet Take 1 tablet (10 mg total) by mouth daily. 08/30/22   Simmons-Robinson, Makiera, MD  multivitamin (RENA-VIT) TABS tablet Take 1 tablet by mouth daily.  09/13/19   [provider]  omeprazole (PRILOSEC) 40 MG capsule Take 1 capsule (40 mg total) by mouth daily. 11/30/21   Esaw Grandchild A, DO  OXYGEN  Inhale 2 L into the lungs at bedtime.    [provider]  sertraline (ZOLOFT) 25 MG tablet TAKE 1 TABLET BY MOUTH EVERY DAY 12/05/22   Simmons-Robinson, Makiera, MD  torsemide (DEMADEX) 100 MG tablet TAKE 1 TABLET BY MOUTH EVERY DAY 02/22/23   Ostwalt, Edmon Crape, PA-C  traZODone (DESYREL) 150 MG tablet TAKE 1 TABLET BY MOUTH EVERYDAY AT BEDTIME 01/12/23   Simmons-Robinson, Tawanna Cooler, MD     Allergies  Antihistamines, chlorpheniramine-type; Other; Paxil [paroxetine]; and Codeine   Family History   Family History  Problem Relation Age of Onset   Heart attack Mother    Heart disease Sister    Cancer Sister      Physical Exam  Triage Vital Signs: ED Triage Vitals  Encounter Vitals Group     BP 04/06/23 1542 (!) 147/57     Systolic BP Percentile --      Diastolic BP Percentile --  Pulse Rate 04/06/23 1542 89     Resp 04/06/23 1542 16     Temp 04/06/23 1542 98.4 F (36.9 C)     Temp Source 04/06/23 1542 Oral     SpO2 04/06/23 1546 99 %     Weight 04/06/23 1545 80 lb 6.4 oz (36.5 kg)     Height 04/06/23 1545 5\' 3"  (1.6 m)     Head Circumference --      Peak Flow --      Pain Score 04/06/23 1545 0     Pain Loc --      Pain Education --      Exclude from Growth Chart --     Updated Vital Signs: BP (!) 156/64 (BP Location: Right Arm)   Pulse 76   Temp 98.7 F (37.1 C) (Oral)   Resp 18   Ht 5\' 3"  (1.6 m)   Wt 36.5 kg   SpO2 94%   BMI 14.24 kg/m    General: Awake, no distress.  Cachectic. CV:  RRR.  Good peripheral perfusion.  Resp:  Normal effort.  CTAB. Abd:  Nontender.  No distention.  Other:  No truncal vesicles.   ED Results / Procedures / Treatments  Labs (all labs ordered are listed, but only abnormal results are displayed) Labs Reviewed  BASIC METABOLIC PANEL - Abnormal; Notable for the following components:      Result Value   Potassium 2.6 (*)    Glucose, Bld 107 (*)    Creatinine, Ser 2.36 (*)    GFR, Estimated 19 (*)    All other  components within normal limits  CULTURE, BLOOD (ROUTINE X 2)  CULTURE, BLOOD (ROUTINE X 2)  URINE CULTURE  CBC WITH DIFFERENTIAL/PLATELET  URINALYSIS, ROUTINE W REFLEX MICROSCOPIC  LACTIC ACID, PLASMA  LACTIC ACID, PLASMA  TROPONIN I (HIGH SENSITIVITY)     EKG  ED ECG REPORT I, Kadin Bera J, the attending physician, personally viewed and interpreted this ECG.   Date: 04/07/2023  EKG Time: 2144  Rate: 78  Rhythm: normal sinus rhythm  Axis: Normal  Intervals:none  ST&T Change: Nonspecific    RADIOLOGY I have independently visualized and interpreted patient's imaging studies as well as noted the radiology interpretation:  Chest x-ray: Possible patchy right midlung infiltrates  CT renal colic: Cholelithiasis, gastritis, moderate pericardial effusion  Official radiology report(s): CT Renal Stone Study  Result Date: 04/07/2023 CLINICAL DATA:  Right flank pain with hematuria, severe weight loss and difficulty eating. EXAM: CT ABDOMEN AND PELVIS WITHOUT CONTRAST TECHNIQUE: Multidetector CT imaging of the abdomen and pelvis was performed following the standard protocol without IV contrast. RADIATION DOSE REDUCTION: This exam was performed according to the departmental dose-optimization program which includes automated exposure control, adjustment of the mA and/or kV according to patient size and/or use of iterative reconstruction technique. COMPARISON:  July 17, 2020 FINDINGS: Lower chest: There is mild cardiomegaly with a predominantly stable moderate sized pericardial effusion. Hepatobiliary: No focal liver abnormality is seen. Multiple gallstones are seen within a contracted gallbladder, without evidence of gallbladder wall thickening, pericholecystic inflammation or biliary dilatation. Pancreas: Unremarkable. No pancreatic ductal dilatation or surrounding inflammatory changes. Spleen: A stable 2.8 cm diameter partially calcified cyst is seen within the posterior aspect of the spleen.  Adrenals/Urinary Tract: Adrenal glands are unremarkable. The kidneys are mildly atrophic in size, without renal calculi, focal lesion, or hydronephrosis. A mild amount of air is seen within the lumen of the urinary bladder. Stomach/Bowel: The stomach is poorly distended.  Moderate to marked severity diffuse gastric wall thickening is noted. The appendix is surgically absent. No evidence of bowel dilatation. Numerous diverticula are seen scattered throughout the large bowel. Vascular/Lymphatic: Aortic atherosclerosis. No enlarged abdominal or pelvic lymph nodes. Reproductive: Status post hysterectomy. No adnexal masses. Other: No abdominal wall hernia or abnormality. No abdominopelvic ascites. Musculoskeletal: Diffusely sclerotic vertebral bodies are seen with marked severity multilevel degenerative changes noted throughout the thoracolumbar spine. IMPRESSION: 1. Cholelithiasis. 2. Colonic diverticulosis. 3. Moderate to marked severity diffuse gastric wall thickening which may represent gastritis. Correlation with follow-up upper GI series or endoscopy is recommended. 4. Mild amount of air within the lumen of the urinary bladder which may be secondary to recent instrumentation. 5. Mild cardiomegaly with a predominantly stable moderate sized pericardial effusion. 6. Aortic atherosclerosis. Aortic Atherosclerosis (ICD10-I70.0). Electronically Signed   By: Aram Candela M.D.   On: 04/07/2023 00:10   DG Chest Portable 1 View  Result Date: 04/06/2023 CLINICAL DATA:  Hypoxia EXAM: PORTABLE CHEST 1 VIEW COMPARISON:  03/09/2022 FINDINGS: The patient's head and chin obscure the apices. Chronic elevation of the right diaphragm. Cardiomegaly. Aortic atherosclerosis. Possible patchy right mid lung infiltrates. No pneumothorax. Severe shoulder degenerative change IMPRESSION: Possible patchy right mid lung infiltrates. Chronic elevation of right diaphragm. Cardiomegaly. Electronically Signed   By: Jasmine Pang M.D.   On:  04/06/2023 23:40     PROCEDURES:  Critical Care performed: No  Procedures   MEDICATIONS ORDERED IN ED: Medications  potassium chloride 10 mEq in 100 mL IVPB (has no administration in time range)  potassium chloride 10 mEq in 100 mL IVPB (has no administration in time range)     IMPRESSION / MDM / ASSESSMENT AND PLAN / ED COURSE  I reviewed the triage vital signs and the nursing notes.                             89 year old female referred by her PCP for failure to thrive, hypoxia, right flank pain. Differential includes, but is not limited to, viral syndrome, bronchitis including COPD exacerbation, pneumonia, reactive airway disease including asthma, CHF including exacerbation with or without pulmonary/interstitial edema, pneumothorax, ACS, thoracic trauma, and pulmonary embolism.  Personally reviewed patient's records and note her PCP documentation from yesterday.  Patient's presentation is most consistent with acute presentation with potential threat to life or bodily function.  Laboratory results demonstrate normal WBC of 8, hypokalemia with potassium 2.6, improved renal function from prior.  CT renal stone study negative for acute process.  Chest x-ray concerning for pneumonia.  Will obtain blood cultures, lactic acid.  Initiate IV antibiotics, fluids, potassium.  Consult hospitalist services for evaluation and admission.      FINAL CLINICAL IMPRESSION(S) / ED DIAGNOSES   Final diagnoses:  Failure to thrive in adult  Hypokalemia  Community acquired pneumonia, unspecified laterality     Rx / DC Orders   ED Discharge Orders     None        Note:  This document was prepared using Dragon voice recognition software and may include unintentional dictation errors.   Irean Hong, MD 04/07/23 [phone]

## 2023-04-07 NOTE — Assessment & Plan Note (Signed)
No signs of acute exacerbation, continue renal replacement therapy with ultrafiltration.  Likely her dry weight has changed due to loss of  muscle mass.  Continue carvedilol and torsemide.

## 2023-04-07 NOTE — Assessment & Plan Note (Signed)
Nephrology following, their assistance is appreciated  Patient receiving hemodialysis per her schedule of M-W-F.

## 2023-04-07 NOTE — Progress Notes (Signed)
Received consult for this patient for continued dialysis. Patient appears stable and does not require dialysis today. Will monitor for need of dialysis tomorrow with formal note to follow/

## 2023-04-07 NOTE — Assessment & Plan Note (Signed)
Continue blood pressure control with carvedilol.  

## 2023-04-07 NOTE — ED Notes (Signed)
Called lab to collect Troponin from pt. State that they will come ASAP but that it may not be until 6 am or after. MD notified of this and states that this is acceptable.

## 2023-04-07 NOTE — Assessment & Plan Note (Signed)
Positive right-sided flank pain with urinalysis being indicative of infection IV Rocephin Urine culture Monitor

## 2023-04-07 NOTE — Assessment & Plan Note (Signed)
Decompensated respiratory failure requiring 2 L nasal cannula with noted pneumonia on imaging IV Rocephin azithromycin Blood and respiratory cultures Continue supplemental oxygen Monitor

## 2023-04-07 NOTE — Progress Notes (Signed)
PHARMACY - PHYSICIAN COMMUNICATION CRITICAL VALUE ALERT - BLOOD CULTURE IDENTIFICATION (BCID)  Lori Stanley is an 89 y.o. female who presented to [facility] on 04/07/2023 with a chief complaint of failure to thrive and flank pain.  Assessment:  1/4 bottles growing strep species (not further identified by BCID), likely a contaminant  Name of physician (or Provider) Contacted: Dr. Allena Katz  Current antibiotics: ceftriaxone and azithromycin  Changes to prescribed antibiotics recommended:  Patient is on recommended antibiotics - No changes needed  Results for orders placed or performed during the hospital encounter of 04/07/23  Blood Culture ID Panel (Reflexed) (Collected: 04/07/2023  3:40 AM)  Result Value Ref Range   Enterococcus faecalis NOT DETECTED NOT DETECTED   Enterococcus Faecium NOT DETECTED NOT DETECTED   Listeria monocytogenes NOT DETECTED NOT DETECTED   Staphylococcus species NOT DETECTED NOT DETECTED   Staphylococcus aureus (BCID) NOT DETECTED NOT DETECTED   Staphylococcus epidermidis NOT DETECTED NOT DETECTED   Staphylococcus lugdunensis NOT DETECTED NOT DETECTED   Streptococcus species DETECTED (A) NOT DETECTED   Streptococcus agalactiae NOT DETECTED NOT DETECTED   Streptococcus pneumoniae NOT DETECTED NOT DETECTED   Streptococcus pyogenes NOT DETECTED NOT DETECTED   A.calcoaceticus-baumannii NOT DETECTED NOT DETECTED   Bacteroides fragilis NOT DETECTED NOT DETECTED   Enterobacterales NOT DETECTED NOT DETECTED   Enterobacter cloacae complex NOT DETECTED NOT DETECTED   Escherichia coli NOT DETECTED NOT DETECTED   Klebsiella aerogenes NOT DETECTED NOT DETECTED   Klebsiella oxytoca NOT DETECTED NOT DETECTED   Klebsiella pneumoniae NOT DETECTED NOT DETECTED   Proteus species NOT DETECTED NOT DETECTED   Salmonella species NOT DETECTED NOT DETECTED   Serratia marcescens NOT DETECTED NOT DETECTED   Haemophilus influenzae NOT DETECTED NOT DETECTED   Neisseria meningitidis  NOT DETECTED NOT DETECTED   Pseudomonas aeruginosa NOT DETECTED NOT DETECTED   Stenotrophomonas maltophilia NOT DETECTED NOT DETECTED   Candida albicans NOT DETECTED NOT DETECTED   Candida auris NOT DETECTED NOT DETECTED   Candida glabrata NOT DETECTED NOT DETECTED   Candida krusei NOT DETECTED NOT DETECTED   Candida parapsilosis NOT DETECTED NOT DETECTED   Candida tropicalis NOT DETECTED NOT DETECTED   Cryptococcus neoformans/gattii NOT DETECTED NOT DETECTED    Thank you for involving pharmacy in this patient's care.   Rockwell Alexandria, PharmD Clinical Pharmacist 04/07/2023 9:20 PM

## 2023-04-08 DIAGNOSIS — I10 Essential (primary) hypertension: Secondary | ICD-10-CM | POA: Diagnosis not present

## 2023-04-08 DIAGNOSIS — R1310 Dysphagia, unspecified: Secondary | ICD-10-CM

## 2023-04-08 DIAGNOSIS — R319 Hematuria, unspecified: Secondary | ICD-10-CM

## 2023-04-08 DIAGNOSIS — N390 Urinary tract infection, site not specified: Secondary | ICD-10-CM

## 2023-04-08 DIAGNOSIS — E1122 Type 2 diabetes mellitus with diabetic chronic kidney disease: Secondary | ICD-10-CM

## 2023-04-08 DIAGNOSIS — N186 End stage renal disease: Secondary | ICD-10-CM | POA: Diagnosis not present

## 2023-04-08 DIAGNOSIS — I5032 Chronic diastolic (congestive) heart failure: Secondary | ICD-10-CM

## 2023-04-08 LAB — COMPREHENSIVE METABOLIC PANEL
ALT: 15 U/L (ref 0–44)
AST: 18 U/L (ref 15–41)
Albumin: 2.9 g/dL — ABNORMAL LOW (ref 3.5–5.0)
Alkaline Phosphatase: 194 U/L — ABNORMAL HIGH (ref 38–126)
Anion gap: 8 (ref 5–15)
BUN: 29 mg/dL — ABNORMAL HIGH (ref 8–23)
CO2: 27 mmol/L (ref 22–32)
Calcium: 8.7 mg/dL — ABNORMAL LOW (ref 8.9–10.3)
Chloride: 108 mmol/L (ref 98–111)
Creatinine, Ser: 2.52 mg/dL — ABNORMAL HIGH (ref 0.44–1.00)
GFR, Estimated: 18 mL/min — ABNORMAL LOW (ref >60)
Glucose, Bld: 102 mg/dL — ABNORMAL HIGH (ref 70–99)
Potassium: 3.1 mmol/L — ABNORMAL LOW (ref 3.5–5.1)
Sodium: 143 mmol/L (ref 135–145)
Total Bilirubin: 0.7 mg/dL (ref <1.2)
Total Protein: 5.9 g/dL — ABNORMAL LOW (ref 6.5–8.1)

## 2023-04-08 LAB — CBC
HCT: 37 % (ref 36.0–46.0)
Hemoglobin: 11.6 g/dL — ABNORMAL LOW (ref 12.0–15.0)
MCH: 27.6 pg (ref 26.0–34.0)
MCHC: 31.4 g/dL (ref 30.0–36.0)
MCV: 87.9 fL (ref 80.0–100.0)
Platelets: 212 10*3/uL (ref 150–400)
RBC: 4.21 MIL/uL (ref 3.87–5.11)
RDW: 15.3 % (ref 11.5–15.5)
WBC: 12.2 10*3/uL — ABNORMAL HIGH (ref 4.0–10.5)
nRBC: 0 % (ref 0.0–0.2)

## 2023-04-08 LAB — PHOSPHORUS: Phosphorus: 3.4 mg/dL (ref 2.5–4.6)

## 2023-04-08 LAB — MAGNESIUM: Magnesium: 2.1 mg/dL (ref 1.7–2.4)

## 2023-04-08 MED ORDER — CARVEDILOL 3.125 MG PO TABS
3.1250 mg | ORAL_TABLET | Freq: Two times a day (BID) | ORAL | Status: DC
  Administered 2023-04-09 – 2023-04-14 (×12): 3.125 mg via ORAL
  Filled 2023-04-08 (×13): qty 1

## 2023-04-08 MED ORDER — TRAZODONE HCL 50 MG PO TABS: 150.0000 mg | ORAL_TABLET | Freq: Every evening | ORAL | Status: DC | PRN

## 2023-04-08 MED ORDER — BRIMONIDINE TARTRATE 0.2 % OP SOLN
1.0000 [drp] | Freq: Two times a day (BID) | OPHTHALMIC | Status: DC
Start: 2023-04-09 — End: 2023-04-18
  Administered 2023-04-09 – 2023-04-18 (×19): 1 [drp] via OPHTHALMIC
  Filled 2023-04-08 (×3): qty 5

## 2023-04-08 MED ORDER — TORSEMIDE 20 MG PO TABS
100.0000 mg | ORAL_TABLET | Freq: Every day | ORAL | Status: DC
  Administered 2023-04-09 – 2023-04-18 (×10): 100 mg via ORAL
  Filled 2023-04-08 (×10): qty 5

## 2023-04-08 MED ORDER — PANTOPRAZOLE SODIUM 40 MG PO TBEC
40.0000 mg | DELAYED_RELEASE_TABLET | Freq: Every day | ORAL | Status: DC
  Administered 2023-04-08 – 2023-04-18 (×11): 40 mg via ORAL
  Filled 2023-04-08 (×11): qty 1

## 2023-04-08 MED ORDER — POTASSIUM CHLORIDE 20 MEQ PO PACK
20.0000 meq | PACK | Freq: Once | ORAL | Status: AC
  Administered 2023-04-08: 20 meq via ORAL
  Filled 2023-04-08: qty 1

## 2023-04-08 MED ORDER — SERTRALINE HCL 50 MG PO TABS
25.0000 mg | ORAL_TABLET | Freq: Every day | ORAL | Status: DC
  Administered 2023-04-08 – 2023-04-18 (×11): 25 mg via ORAL
  Filled 2023-04-08 (×11): qty 1

## 2023-04-08 MED ORDER — RENA-VITE PO TABS
1.0000 | ORAL_TABLET | Freq: Every day | ORAL | Status: DC
Start: 2023-04-09 — End: 2023-04-18
  Administered 2023-04-09 – 2023-04-18 (×10): 1 via ORAL
  Filled 2023-04-08 (×10): qty 1

## 2023-04-08 NOTE — Evaluation (Addendum)
Physical Therapy Evaluation Patient Details Name: Lori Stanley MRN: [id number] DOB: 01-Jun-1931 Today's Date: 04/08/2023  History of Present Illness  89 y.o. female with PMHx of HFpEF, ESRD on hemodialysis Monday Wednesday Friday, GERD, hypertension presenting with failure to thrive, pneumonia, gastritis, Cholelithiasis and Colonic diverticulosis and UTI.  Clinical Impression  89 yo Female presents to hospital from home. She lives at home alone. Her niece lives next door. She reports her family can stay with her from time to time. However unsure how much family support she really has at home. Patient lives in a single story home with ramped entrance. She does have a RW for mobility. Patient does report a decline in mobility due to recent illness and decline in health. She currently requires supervision for supine to sit bed mobility but min A to transition sit to supine (requiring assistance to lift legs into bed). She is max A to scoot up in bed. Patient requires CGA to stand by assist for transfers. She ambulated with RW requiring CGA for safety demonstrating reciprocal gait pattern, wide base of support and mild unsteadiness/veering to right side. Patient expressed strong desire to go home, however therapist concerned about safety with limited family support. She would benefit from skilled PT intervention to improve strength,balance and mobility.        If plan is discharge home, recommend the following: A little help with walking and/or transfers;A little help with bathing/dressing/bathroom;Assistance with cooking/housework;Direct supervision/assist for financial management;Assist for transportation;Direct supervision/assist for medications management;Help with stairs or ramp for entrance   Can travel by private vehicle   No    Equipment Recommendations None recommended by PT  Recommendations for Other Services       Functional Status Assessment Patient has had a recent decline in  their functional status and demonstrates the ability to make significant improvements in function in a reasonable and predictable amount of time.     Precautions / Restrictions Precautions Precautions: Fall Restrictions Weight Bearing Restrictions: No      Mobility  Bed Mobility Overal bed mobility: Needs Assistance Bed Mobility: Supine to Sit, Sit to Supine     Supine to sit: Supervision Sit to supine: Min assist   General bed mobility comments: Required min A to lift legs back into bed when transitioning sit to supine; Pt is max A to slide up in bed;    Transfers Overall transfer level: Needs assistance Equipment used: Rolling walker (2 wheels) Transfers: Sit to/from Stand Sit to Stand: Supervision, From elevated surface           General transfer comment: SUP for STS from high gurney; CGA to SBA for sit<>stand from bedside commode and stand pivot to bed;    Ambulation/Gait Ambulation/Gait assistance: Contact guard assist Gait Distance (Feet): 20 Feet Assistive device: Rolling walker (2 wheels) Gait Pattern/deviations: Step-through pattern, Trunk flexed Gait velocity: decreased     General Gait Details: pt ambulates with slower gait speed, wide base of support, slight veering to right side requiring min A to steady  Stairs            Wheelchair Mobility     Tilt Bed    Modified Rankin (Stroke Patients Only)       Balance Overall balance assessment: Needs assistance Sitting-balance support: Feet supported, Bilateral upper extremity supported Sitting balance-Leahy Scale: Good     Standing balance support: Bilateral upper extremity supported, Reliant on assistive device for balance, During functional activity Standing balance-Leahy Scale: Fair Standing balance comment: Requires  CGA to SBA when moving with RW, mild unsteadiness noted when walking towards sink; able to stand at sink and wash hands with CGA                              Pertinent Vitals/Pain Pain Assessment Pain Assessment: 0-10 Pain Score: 2  Faces Pain Scale: Hurts a little bit Pain Location: right side Pain Descriptors / Indicators: Aching Pain Intervention(s): Limited activity within patient's tolerance, Monitored during session, Repositioned    Home Living Family/patient expects to be discharged to:: Private residence Living Arrangements: Alone Available Help at Discharge: Family;Available PRN/intermittently (reports they do stay with her from time to time) Type of Home: House Home Access: Ramped entrance       Home Layout: One level Home Equipment: Agricultural consultant (2 wheels);BSC/3in1      Prior Function               Mobility Comments: MOD I with RW for indoor/outdoor mobility ADLs Comments: IND with ADLs, simple IADLs; family drives to appts, brings food in, stays with her at time as well-niece, nephew and sister all assist     Extremity/Trunk Assessment   Upper Extremity Assessment Upper Extremity Assessment: Generalized weakness    Lower Extremity Assessment Lower Extremity Assessment: Generalized weakness    Cervical / Trunk Assessment Cervical / Trunk Assessment: Kyphotic  Communication   Communication Communication: No apparent difficulties Cueing Techniques: Verbal cues  Cognition Arousal: Alert Behavior During Therapy: WFL for tasks assessed/performed Overall Cognitive Status: Within Functional Limits for tasks assessed                                          General Comments General comments (skin integrity, edema, etc.): Pt demonstrates quick drop of SPo2 to 85% when placed on room air, SPo2 quickly rebounded to 100% when on 2L;    Exercises   TREATMENT: PT instructed patient in sit<>stand transfers from bedside commode. She requires CGA to SBA for sit to stand transfer with cues to use handrail for safety. Patient also required therapist assistance for hygiene to clean self after  BM. Upon standing, therapist instructed patient to stand pivot transfer to bed for donning brief. PT was going to have patient walk to sink in room, however she required short seated rest break due to low Spo2 dropping to mid 80's while on 2L O2. After short rest break she was able to walk to sink and stood beside sink to wash hands with CGA from therapist for safety (x10 min)   Assessment/Plan    PT Assessment Patient needs continued PT services  PT Problem List Decreased strength;Cardiopulmonary status limiting activity;Pain;Decreased activity tolerance;Decreased balance;Decreased safety awareness;Decreased mobility       PT Treatment Interventions DME instruction;Balance training;Gait training;Neuromuscular re-education;Functional mobility training;Patient/family education;Therapeutic activities;Therapeutic exercise    PT Goals (Current goals can be found in the Care Plan section)  Acute Rehab PT Goals Patient Stated Goal: to go home PT Goal Formulation: With patient Time For Goal Achievement: 04/22/23 Potential to Achieve Goals: Fair    Frequency Min 1X/week     Co-evaluation               AM-PAC PT "6 Clicks" Mobility  Outcome Measure Help needed turning from your back to your side while in a flat bed without using bedrails?: A  Little Help needed moving from lying on your back to sitting on the side of a flat bed without using bedrails?: A Little Help needed moving to and from a bed to a chair (including a wheelchair)?: A Little Help needed standing up from a chair using your arms (e.g., wheelchair or bedside chair)?: A Little Help needed to walk in hospital room?: A Little Help needed climbing 3-5 steps with a railing? : A Lot 6 Click Score: 17    End of Session Equipment Utilized During Treatment: Gait belt Activity Tolerance: Patient tolerated treatment well Patient left: in bed;with call bell/phone within reach Nurse Communication: Mobility status PT Visit  Diagnosis: Unsteadiness on feet (R26.81);Muscle weakness (generalized) (M62.81)    Time: 1610-9604 PT Time Calculation (min) (ACUTE ONLY): 25 min   Charges:   PT Evaluation $PT Eval Low Complexity: 1 Low PT Treatments $Therapeutic Activity: 8-22 mins PT General Charges $$ ACUTE PT VISIT: 1 Visit           Shruti Arrey PT, DPT 04/08/2023, 5:41 PM

## 2023-04-08 NOTE — Assessment & Plan Note (Addendum)
Urine culture with 3.000 CF enterococcus, possible contamination.  Plan to continue current antibiotic therapy with ceftriaxone for total of 3 days.   Acute hypoxemic respiratory failure has resolved with 02 saturation 100% on room air  Will continue oxymetry monitoring Incentive spirometer.  Pneumonia has been ruled out.

## 2023-04-08 NOTE — Hospital Course (Addendum)
89 yo female with the past medical history of heart failure, hypertension, ESRD, and gastritis who presented with difficulty swallowing for 2 to 3 weeks prior to hospitalization with progressive weight loss.  Due to concerns over failure to thrive and volume depletion the hospitalist group was then called to assess the patient for admission to the hospital.  During a thorough evaluation patient was identified to have urinalysis suggestive of urinary tract infection.  Urine culture on 12/5 grew out streptococcus anginosus with additional blood culture drawn on 12/6 growing out same organism.  Of note, urine culture performed on 12/6 grew out Enterococcus faecalis however this was felt to be contaminant.  Patient was treated with intravenous ceftriaxone and infectious disease was consulted.  Infectious disease recommended evaluating for infective endocarditis in the setting of systolic murmur on cardiac examination although due to cervical spine disease a TEE could not be performed.  They recommended a TTE instead.  Concerning the patient's difficulty swallowing Dr. [name] with ENT was consulted and performed a transnasal flexible laryngoscopy revealing some mild edema on laryngoscopy with a short course of systemic steroids recommended.  Patient received hemodialysis per her usual Monday, Wednesday, Friday dialysis treatment schedule while hospitalized.  Throughout this complicated hospitalization physical therapy evaluated the patient and patient felt the patient would benefit from skilled services in a skilled nursing facility.  Plan is for patient to proceed with skilled nurse facility placement once bed is obtained and insurance authorization is complete.

## 2023-04-08 NOTE — Progress Notes (Signed)
Received patient in bed   Alert and oriented.  Informed consent signed and in chart.   TX duration:2.5 hours  Patient tolerated well.  Alert, without acute distress.  Hand-off given to patient's nurse.   Access used: avg Access issues: high/ low alarm   Total UF removed: 940 Medication(s) given: none Post HD VS: see table below Post HD weight: 43.6kg    [facility]   04/08/23 2307  Vitals  Temp 97.6 F (36.4 C)  Temp Source Oral  BP (!) 136/55  MAP (mmHg) 79  BP Location Right Arm  BP Method Automatic  Patient Position (if appropriate) Lying  Pulse Rate 74  Pulse Rate Source Monitor  ECG Heart Rate 73  Resp 18  Oxygen Therapy  SpO2 97 %  O2 Device Nasal Cannula  O2 Flow Rate (L/min) 2 L/min  Patient Activity (if Appropriate) In bed  Pulse Oximetry Type Continuous  During Treatment Monitoring  Blood Flow Rate (mL/min) 0 mL/min  Arterial Pressure (mmHg) -2.83 mmHg  Venous Pressure (mmHg) -1.21 mmHg  TMP (mmHg) -50.1 mmHg  Ultrafiltration Rate (mL/min) 0 mL/min  Dialysate Flow Rate (mL/min) 299 ml/min  Dialysate Potassium Concentration 4  Dialysate Calcium Concentration 2.5  Duration of HD Treatment -hour(s) 2.5 hour(s)  Cumulative Fluid Removed (mL) per Treatment  940.39  HD Safety Checks Performed Yes  Intra-Hemodialysis Comments Tolerated well  Post Treatment  Dialyzer Clearance Lightly streaked  Hemodialysis Intake (mL) 0 mL  Liters Processed 60  Fluid Removed (mL) 940 mL  Tolerated HD Treatment Yes  AVG/AVF Arterial Site Held (minutes) 5 minutes  AVG/AVF Venous Site Held (minutes) 5 minutes  Fistula / Graft Left Upper arm Arteriovenous vein graft  No placement date or time found.   Placed prior to admission: Yes  Orientation: Left  Access Location: Upper arm  Access Type: Arteriovenous vein graft  Site Condition No complications  Fistula / Graft Assessment Present;Thrill;Bruit  Status Deaccessed;Flushed;Patent

## 2023-04-08 NOTE — Progress Notes (Signed)
[facility]  ROUNDING NOTE   Subjective:   [name] was admitted to [facility] on 04/07/2023 for Hypokalemia [E87.6] Pericardial effusion [I31.39] Pneumonia [J18.9] Failure to thrive in adult [R62.7] Urinary tract infection without hematuria, site unspecified [N39.0] Community acquired pneumonia, unspecified laterality [J18.9]  Last hemodialysis treatment was Wednesday 12/4     Objective:  Vital signs in last 24 hours:  Temp:  [98 F (36.7 C)-100.2 F (37.9 C)] 98.4 F (36.9 C) (12/07 0941) Pulse Rate:  [25-102] 93 (12/07 1734) Resp:  [16-35] 35 (12/07 1500) BP: (162-188)/(58-88) 181/74 (12/07 1500) SpO2:  [36 %-100 %] 98 % (12/07 1734)  Weight change:  Filed Weights   04/06/23 1545  Weight: 36.5 kg    Intake/Output: I/O last 3 completed shifts: In: 250 [IV Piggyback:250] Out: -    Intake/Output this shift:  Total I/O In: 100 [IV Piggyback:100] Out: -   Physical Exam: General: NAD,   Head: +dysphonic voice   Eyes: Anicteric, PERRL  Neck: Supple, trachea midline  Lungs:  Clear to auscultation  Heart: Regular rate and rhythm  Abdomen:  Soft, nontender,   Extremities:  no peripheral edema.  Neurologic: Nonfocal, moving all four extremities  Skin: No lesions  Access: AVF    Basic Metabolic Panel: Recent Labs  Lab 04/06/23 1159 04/06/23 1653 04/06/23 1654 04/08/23 0313  NA 141  --  139 143  K 3.5  --  2.6* 3.1*  CL 97  --  100 108  CO2 28  --  30 27  GLUCOSE 88  --  107* 102*  BUN 20  --  22 29*  CREATININE 2.16*  --  2.36* 2.52*  CALCIUM 10.3  --  9.1 8.7*  MG  --  1.8  --  2.1  PHOS  --  3.6  --  3.4    Liver Function Tests: Recent Labs  Lab 04/06/23 1159 04/08/23 0313  AST 23 18  ALT 17 15  ALKPHOS 275* 194*  BILITOT 0.3 0.7  PROT 6.6 5.9*  ALBUMIN 3.8 2.9*   Recent Labs  Lab 04/06/23 1159  LIPASE 24   No results for input(s): "AMMONIA" in the last 168 hours.  CBC: Recent Labs  Lab 04/06/23 1159  04/06/23 1654 04/08/23 0313  WBC 6.8 8.0 12.2*  NEUTROABS  --  6.7  --   HGB 12.5 12.3 11.6*  HCT 40.8 38.9 37.0  MCV 90 89.4 87.9  PLT 238 213 212    Cardiac Enzymes: No results for input(s): "CKTOTAL", "CKMB", "CKMBINDEX", "TROPONINI" in the last 168 hours.  BNP: Invalid input(s): "POCBNP"  CBG: No results for input(s): "GLUCAP" in the last 168 hours.  Microbiology: Results for orders placed or performed during the hospital encounter of 04/07/23  Culture, blood (routine x 2)     Status: None (Preliminary result)   Collection Time: 04/07/23  3:04 AM   Specimen: BLOOD  Result Value Ref Range Status   Specimen Description BLOOD BLOOD RIGHT ARM  Final   Special Requests   Final    BOTTLES DRAWN AEROBIC AND ANAEROBIC Blood Culture adequate volume   Culture   Final    NO GROWTH 1 DAY Performed at [facility], [address] [place], [place] [address]    Report Status PENDING  Incomplete  Culture, blood (routine x 2)     Status: Abnormal (Preliminary result)   Collection Time: 04/07/23  3:40 AM   Specimen: BLOOD  Result Value Ref Range Status  Specimen Description   Final    BLOOD BLOOD RIGHT FOREARM Performed at [facility], [address] [place], [place] [address]    Special Requests   Final    BOTTLES DRAWN AEROBIC AND ANAEROBIC Blood Culture adequate volume Performed at [facility], [address] [place], [place] [address]    Culture  Setup Time   Final    GRAM POSITIVE COCCI ANAEROBIC BOTTLE ONLY CRITICAL RESULT CALLED TO, READ BACK BY AND VERIFIED WITH: EMILY STEINBOCK @ 2104 04/07/23 LFD     Culture (A)  Final    STREPTOCOCCUS ANGINOSIS SUSCEPTIBILITIES TO FOLLOW Performed at [facility], [address] [place], [place] [address]    Report Status PENDING  Incomplete  Blood Culture ID Panel (Reflexed)     Status: Abnormal   Collection Time: 04/07/23  3:40 AM  Result Value Ref Range Status   Enterococcus  faecalis NOT DETECTED NOT DETECTED Final   Enterococcus Faecium NOT DETECTED NOT DETECTED Final   Listeria monocytogenes NOT DETECTED NOT DETECTED Final   Staphylococcus species NOT DETECTED NOT DETECTED Final   Staphylococcus aureus (BCID) NOT DETECTED NOT DETECTED Final   Staphylococcus epidermidis NOT DETECTED NOT DETECTED Final   Staphylococcus lugdunensis NOT DETECTED NOT DETECTED Final   Streptococcus species DETECTED (A) NOT DETECTED Final    Comment: Not Enterococcus species, Streptococcus agalactiae, Streptococcus pyogenes, or Streptococcus pneumoniae. EMILY STEINBOCK @ 2104 04/07/23 LFD    Streptococcus agalactiae NOT DETECTED NOT DETECTED Final   Streptococcus pneumoniae NOT DETECTED NOT DETECTED Final   Streptococcus pyogenes NOT DETECTED NOT DETECTED Final   A.calcoaceticus-baumannii NOT DETECTED NOT DETECTED Final   Bacteroides fragilis NOT DETECTED NOT DETECTED Final   Enterobacterales NOT DETECTED NOT DETECTED Final   Enterobacter cloacae complex NOT DETECTED NOT DETECTED Final   Escherichia coli NOT DETECTED NOT DETECTED Final   Klebsiella aerogenes NOT DETECTED NOT DETECTED Final   Klebsiella oxytoca NOT DETECTED NOT DETECTED Final   Klebsiella pneumoniae NOT DETECTED NOT DETECTED Final   Proteus species NOT DETECTED NOT DETECTED Final   Salmonella species NOT DETECTED NOT DETECTED Final   Serratia marcescens NOT DETECTED NOT DETECTED Final   Haemophilus influenzae NOT DETECTED NOT DETECTED Final   Neisseria meningitidis NOT DETECTED NOT DETECTED Final   Pseudomonas aeruginosa NOT DETECTED NOT DETECTED Final   Stenotrophomonas maltophilia NOT DETECTED NOT DETECTED Final   Candida albicans NOT DETECTED NOT DETECTED Final   Candida auris NOT DETECTED NOT DETECTED Final   Candida glabrata NOT DETECTED NOT DETECTED Final   Candida krusei NOT DETECTED NOT DETECTED Final   Candida parapsilosis NOT DETECTED NOT DETECTED Final   Candida tropicalis NOT DETECTED NOT DETECTED  Final   Cryptococcus neoformans/gattii NOT DETECTED NOT DETECTED Final    Comment: Performed at [facility], [address], [place], [place] [address]  Urine Culture     Status: Abnormal (Preliminary result)   Collection Time: 04/07/23  3:53 AM   Specimen: In/Out Cath Urine  Result Value Ref Range Status   Specimen Description   Final    IN/OUT CATH URINE Performed at [facility], [address], [place], [place] [address]    Special Requests   Final    NONE Performed at [facility], [address], [place], [place] [address]    Culture (A)  Final    3,000 COLONIES/mL ENTEROCOCCUS FAECALIS SUSCEPTIBILITIES TO FOLLOW Performed at [facility], [address] [place], [place] [address]  Report Status PENDING  Incomplete  Resp panel by RT-PCR (RSV, Flu A&B, Covid) Anterior Nasal Swab     Status: None   Collection Time: 04/07/23  3:53 AM   Specimen: Anterior Nasal Swab  Result Value Ref Range Status   SARS Coronavirus 2 by RT PCR NEGATIVE NEGATIVE Final    Comment: (NOTE) SARS-CoV-2 target nucleic acids are NOT DETECTED.  The SARS-CoV-2 RNA is generally detectable in upper respiratory specimens during the acute phase of infection. The lowest concentration of SARS-CoV-2 viral copies this assay can detect is 138 copies/mL. A negative result does not preclude SARS-Cov-2 infection and should not be used as the sole basis for treatment or other patient management decisions. A negative result may occur with  improper specimen collection/handling, submission of specimen other than nasopharyngeal swab, presence of viral mutation(s) within the areas targeted by this assay, and inadequate number of viral copies(<138 copies/mL). A negative result must be combined with clinical observations, patient history, and epidemiological information. The expected result is Negative.  Fact Sheet for Patients:   BloggerCourse.com  Fact Sheet for Healthcare Providers:  SeriousBroker.it  This test is no t yet approved or cleared by the [place] FDA and  has been authorized for detection and/or diagnosis of SARS-CoV-2 by FDA under an Emergency Use Authorization (EUA). This EUA will remain  in effect (meaning this test can be used) for the duration of the COVID-19 declaration under Section 564(b)(1) of the Act, 21 U.S.C.section 360bbb-3(b)(1), unless the authorization is terminated  or revoked sooner.       Influenza A by PCR NEGATIVE NEGATIVE Final   Influenza B by PCR NEGATIVE NEGATIVE Final    Comment: (NOTE) The Xpert Xpress SARS-CoV-2/FLU/RSV plus assay is intended as an aid in the diagnosis of influenza from Nasopharyngeal swab specimens and should not be used as a sole basis for treatment. Nasal washings and aspirates are unacceptable for Xpert Xpress SARS-CoV-2/FLU/RSV testing.  Fact Sheet for Patients: BloggerCourse.com  Fact Sheet for Healthcare Providers: SeriousBroker.it  This test is not yet approved or cleared by the [place] FDA and has been authorized for detection and/or diagnosis of SARS-CoV-2 by FDA under an Emergency Use Authorization (EUA). This EUA will remain in effect (meaning this test can be used) for the duration of the COVID-19 declaration under Section 564(b)(1) of the Act, 21 U.S.C. section 360bbb-3(b)(1), unless the authorization is terminated or revoked.     Resp Syncytial Virus by PCR NEGATIVE NEGATIVE Final    Comment: (NOTE) Fact Sheet for Patients: BloggerCourse.com  Fact Sheet for Healthcare Providers: SeriousBroker.it  This test is not yet approved or cleared by the [place] FDA and has been authorized for detection and/or diagnosis of SARS-CoV-2 by FDA under an Emergency Use  Authorization (EUA). This EUA will remain in effect (meaning this test can be used) for the duration of the COVID-19 declaration under Section 564(b)(1) of the Act, 21 U.S.C. section 360bbb-3(b)(1), unless the authorization is terminated or revoked.  Performed at [facility], [address] [place], [place] [address]     Coagulation Studies: No results for input(s): "LABPROT", "INR" in the last 72 hours.  Urinalysis: Recent Labs    04/06/23 1147 04/07/23 0353  COLORURINE  --  YELLOW*  LABSPEC  --  1.013  PHURINE  --  6.0  GLUCOSEU  --  NEGATIVE  HGBUR  --  MODERATE*  BILIRUBINUR Negative NEGATIVE  KETONESUR  --  NEGATIVE  PROTEINUR Positive* 100*  UROBILINOGEN 0.2  --  NITRITE Negative NEGATIVE  LEUKOCYTESUR Large (3+)* LARGE*      Imaging: CT SOFT TISSUE NECK WO CONTRAST  Result Date: 04/07/2023 CLINICAL DATA:  Enlarged salivary gland, infection suspected EXAM: CT NECK WITHOUT CONTRAST TECHNIQUE: Multidetector CT imaging of the neck was performed following the standard protocol without intravenous contrast. RADIATION DOSE REDUCTION: This exam was performed according to the departmental dose-optimization program which includes automated exposure control, adjustment of the mA and/or kV according to patient size and/or use of iterative reconstruction technique. COMPARISON:  None Available. FINDINGS: Evaluation is somewhat limited by the absence of intravenous contrast and patient positioning. Pharynx and larynx: Normal. No mass or swelling. Salivary glands: No acute finding in the parotid glands. Poor visualization of the submandibular glands but no definite mass or stone is seen. Thyroid: Normal. Lymph nodes: None enlarged or abnormal density. Vascular: Vascular calcifications, including severe coronary and aortic atherosclerotic calcifications. Limited intracranial: Negative. Visualized orbits: No acute finding. Status post bilateral lens replacements. Mastoids and  visualized paranasal sinuses: Clear. Skeleton: Advanced degenerative changes in the shoulders, with advanced joint space narrowing, subchondral sclerosis and subchondral cystic changes, with bony erosion in both glenohumeral joints. Fluid in the right glenohumeral joint, extending into the subacromial/subdeltoid bursa, likely with full-thickness superior rotator cuff tear. Advanced degenerative changes in the cervical spine, with vertebral body height loss at C7 and T1, which appears chronic. Pannus formation at C1-C2. Osseous fusion of T1 through T8, likely extending more inferiorly off field of view. Upper chest: Emphysema. No focal pulmonary opacity or pleural effusion. IMPRESSION: 1. Evaluation is limited by the absence of intravenous contrast and patient positioning. Within this limitation, no acute finding in the neck. Poor visualization of the submandibular glands but no definite mass or stone is seen. If there is persistent concern, consider ultrasound or contrasted neck CT. 2. Advanced degenerative changes in the shoulders, with bony erosion in both glenohumeral joints and a right glenohumeral joint effusion with subacromial/subdeltoid bursal fluid. CT cannot determine whether this fluid is inflammatory or infected. 3. Advanced degenerative changes in the cervical spine, with vertebral body height loss at C7 and T1, which appears chronic. 4. Severe coronary and aortic atherosclerotic calcifications. Aortic Atherosclerosis (ICD10-I70.0) and Emphysema (ICD10-J43.9). Electronically Signed   By: Wiliam Ke M.D.   On: 04/07/2023 14:12   US Abdomen Limited RUQ (LIVER/GB)  Result Date: 04/07/2023 CLINICAL DATA:  Cholelithiasis EXAM: ULTRASOUND ABDOMEN LIMITED RIGHT UPPER QUADRANT COMPARISON:  Renal stone CT 04/06/2023. FINDINGS: Gallbladder: In the expected location of the gallbladder is a echogenic rounded shadowing area measuring 13 mm. This has is difficult to define as a true gallbladder structure. This  has seen on previous exams as well. On remote studies the gallbladder was felt to be contracted with multiple stones. Appearance is similar going back to a CT of March 2022 Common bile duct: Diameter: 7 mm Liver: No focal lesion identified. Within normal limits in parenchymal echogenicity. Portal vein is patent on color Doppler imaging with normal direction of blood flow towards the liver. Other: None. IMPRESSION: No biliary ductal dilatation. There is a echogenic shadowing area in the hilum of the liver corresponding to the areas of calcification by CT. Previously this was felt to be a contracted gallbladder with stones. That is possible but the structure is not as well defined by this ultrasound. Additional evaluation as clinically appropriate Electronically Signed   By: Karen Kays M.D.   On: 04/07/2023 10:34   CT Renal Stone Study  Result Date: 04/07/2023 CLINICAL  DATA:  Right flank pain with hematuria, severe weight loss and difficulty eating. EXAM: CT ABDOMEN AND PELVIS WITHOUT CONTRAST TECHNIQUE: Multidetector CT imaging of the abdomen and pelvis was performed following the standard protocol without IV contrast. RADIATION DOSE REDUCTION: This exam was performed according to the departmental dose-optimization program which includes automated exposure control, adjustment of the mA and/or kV according to patient size and/or use of iterative reconstruction technique. COMPARISON:  July 17, 2020 FINDINGS: Lower chest: There is mild cardiomegaly with a predominantly stable moderate sized pericardial effusion. Hepatobiliary: No focal liver abnormality is seen. Multiple gallstones are seen within a contracted gallbladder, without evidence of gallbladder wall thickening, pericholecystic inflammation or biliary dilatation. Pancreas: Unremarkable. No pancreatic ductal dilatation or surrounding inflammatory changes. Spleen: A stable 2.8 cm diameter partially calcified cyst is seen within the posterior aspect of the  spleen. Adrenals/Urinary Tract: Adrenal glands are unremarkable. The kidneys are mildly atrophic in size, without renal calculi, focal lesion, or hydronephrosis. A mild amount of air is seen within the lumen of the urinary bladder. Stomach/Bowel: The stomach is poorly distended. Moderate to marked severity diffuse gastric wall thickening is noted. The appendix is surgically absent. No evidence of bowel dilatation. Numerous diverticula are seen scattered throughout the large bowel. Vascular/Lymphatic: Aortic atherosclerosis. No enlarged abdominal or pelvic lymph nodes. Reproductive: Status post hysterectomy. No adnexal masses. Other: No abdominal wall hernia or abnormality. No abdominopelvic ascites. Musculoskeletal: Diffusely sclerotic vertebral bodies are seen with marked severity multilevel degenerative changes noted throughout the thoracolumbar spine. IMPRESSION: 1. Cholelithiasis. 2. Colonic diverticulosis. 3. Moderate to marked severity diffuse gastric wall thickening which may represent gastritis. Correlation with follow-up upper GI series or endoscopy is recommended. 4. Mild amount of air within the lumen of the urinary bladder which may be secondary to recent instrumentation. 5. Mild cardiomegaly with a predominantly stable moderate sized pericardial effusion. 6. Aortic atherosclerosis. Aortic Atherosclerosis (ICD10-I70.0). Electronically Signed   By: Aram Candela M.D.   On: 04/07/2023 00:10   DG Chest Portable 1 View  Result Date: 04/06/2023 CLINICAL DATA:  Hypoxia EXAM: PORTABLE CHEST 1 VIEW COMPARISON:  03/09/2022 FINDINGS: The patient's head and chin obscure the apices. Chronic elevation of the right diaphragm. Cardiomegaly. Aortic atherosclerosis. Possible patchy right mid lung infiltrates. No pneumothorax. Severe shoulder degenerative change IMPRESSION: Possible patchy right mid lung infiltrates. Chronic elevation of right diaphragm. Cardiomegaly. Electronically Signed   By: Jasmine Pang M.D.    On: 04/06/2023 23:40     Medications:    cefTRIAXone (ROCEPHIN)  IV Stopped (04/08/23 0720)    [START ON 04/09/2023] brimonidine  1 drop Both Eyes BID   carvedilol  3.125 mg Oral BID   heparin  5,000 Units Subcutaneous Q12H   [START ON 04/09/2023] multivitamin  1 tablet Oral Daily   pantoprazole  40 mg Oral Daily   sertraline  25 mg Oral Daily   torsemide  100 mg Oral Daily   acetaminophen, ondansetron **OR** ondansetron (ZOFRAN) IV, traZODone  Assessment/ Plan:  [name] is a 89 y.o.  female wth end stage renal disease on hemodialysis, hypertension, congestive heart failure, depression, GERD who presents to [facility] with 04/07/2023 with Hypokalemia [E87.6] Pericardial effusion [I31.39] Pneumonia [J18.9] Failure to thrive in adult [R62.7] Urinary tract infection without hematuria, site unspecified [N39.0] Community acquired pneumonia, unspecified laterality [J18.9]  [facility] MWF Left AVF 43kg  End Stage Renal Disease: missed dialysis yesterday. Plan on dialysis today and then resume MWF schedule Hypertension: 173/73. Elevated. Due  to lack of dialysis. Continue torsemide.  Anemia with chronic kidney disease: mircera as outpatient. Hemoglobin stable at 11.6 Secondary Hyperparathyroidism: currently holding phosphate binders. Gets parsabiv outpatient.    LOS: 1 Tasmia Blumer 12/7/20245:40 PM

## 2023-04-08 NOTE — ED Notes (Signed)
Answered call light, pt advised she wants to get up, pt ask what time is it, advised  pt is is almost 2:00am. Pt stated, I thought it was day time. Encourage pt to stay in the bed and get some rest and reminded her of her cords hooked up to the monitor.

## 2023-04-08 NOTE — Consult Note (Signed)
PHARMACY CONSULT NOTE - ELECTROLYTES  Pharmacy Consult for Electrolyte Monitoring and Replacement   Recent Labs: Height: 5\' 3"  (160 cm) Weight: 36.5 kg (80 lb 6.4 oz) IBW/kg (Calculated) : 52.4 Estimated Creatinine Clearance: 8.7 mL/min (A) (by C-G formula based on SCr of 2.52 mg/dL (H)).  Potassium (mmol/L)  Date Value  04/08/2023 3.1 (L)  05/15/2013 4.0   Magnesium (mg/dL)  Date Value  44/05/270 2.1   Calcium (mg/dL)  Date Value  53/66/4403 8.7 (L)   Calcium, Total (mg/dL)  Date Value  47/42/5956 8.0 (L)   Albumin (g/dL)  Date Value  38/75/6433 2.9 (L)  04/06/2023 3.8  05/11/2013 3.6   Phosphorus (mg/dL)  Date Value  29/51/8841 3.4   Sodium (mmol/L)  Date Value  04/08/2023 143  04/06/2023 141  05/15/2013 132 (L)   Assessment  Lori Stanley is a 89 y.o. female presenting with failure to thrive. Pharmacy has been consulted to monitor and replace electrolytes. ESRD on hemodialysis   Diet: Dysphagia 2 MIVF: None  Pertinent medications: None   Goal of Therapy: Electrolytes WNL  Plan:  K 3.1  on hemodialysis.  Will order KCL 20 meq po packet x1 Check BMP, Mg, Phos with AM labs  Thank you for allowing pharmacy to be a part of this patient's care.  Lori Stanley A, PharmD 04/08/2023 12:52 PM

## 2023-04-08 NOTE — Progress Notes (Signed)
Progress Note   Patient: Lori Stanley UJW:[id number] DOB: [birth year] DOA: 04/07/2023     1 DOS: the patient was seen and examined on 04/08/2023   Brief hospital course: Lori Stanley was admitted to the hospital with the working diagnosis of failure to thrive.   89 yo female with the past medical history of heart failure, hypertension, ESRD, and gastritis who presented with difficulty swallowing for the last 2 to 3 weeks prior to hospitalization. At home positive weight loss. Recent outpatient evaluation, recommended ENT referral for salivary gland enlargement. On her initial physical examination in the ED her blood pressure was 156/64, HR 76, RR 18 and 02 saturation 96%, lungs with no wheezing or rhonchi, heart with S1 and S2 present and regular, abdomen with no distention, positive mild pain to palpation mid right side, no lower extremity edema.   Na 139, K 2,6 Cl 100, bicarbonate 30, glucose 107, bun 22 cr 2,36  Wbc 8,0 hgb 12.3 plt 213  Sars covid 19 negative  Urine analysis SG 1,013, protein 100, leukocytes large, moderate hgb, > 50 rbc, > 50 wbc.   CT abdomen with cholelithiasis, colonic diverticulosis, moderate to marked severity diffuse gastric wall thickening with may represent gastritis.   CT soft tissue neck with limited evaluation. Advance degenerative changes in cervical spine.   Chest radiograph hypoinflation, cardiomegaly with elevation of right hemidiaphragm.   EKG 78 bpm, left axis deviation, left anterior fascicular block, sinus rhythm with no significant ST segment or T wave changes.   12/07 diet advance to dysphagia 2 diet. HD today.   Assessment and Plan: * UTI (urinary tract infection) Plan to continue antibiotic therapy with IV ceftriaxone.  Follow up on cultures, cell count and temperature curve Her mentation has improved.  Follow up with Pt and Ot.   Acute hypoxemic respiratory failure has resolved with 02 saturation 100% on room air  Will continue  oxymetry monitoring Incentive spirometer.  Ruled out pneumonia.   ESRD (end stage renal disease) (HCC) Hypokalemia   Patient will have HD today, she did not had renal replacement therapy yesterday. Continue close follow up on electrolytes.  Patient with muscle mass loss, likely her dry weight has changed.   Dysphagia Continue pantoprazole for antiacid therapy Patient with significant cervical spine disease.  Plan to add dysphagia diet and follow up with speech therapy recommendations.    Failure to thrive in adult    Essential hypertension Continue blood pressure monitoring.  Resume carvedilol.   Diabetes (HCC) Glucose cover and monitoring with insulin sliding scale.   Chronic diastolic CHF (congestive heart failure) (HCC) No signs of acute exacerbation, continue renal replacement therapy with ultrafiltration.  Likely her dry weight has changed due to loss of  muscle mass.  Continue carvedilol and torsemide.         Subjective: Patient is feeling better, no chest pain or dyspnea   Physical Exam: Vitals:   04/08/23 1017 04/08/23 1300 04/08/23 1400 04/08/23 1500  BP: (!) 169/73 (!) 179/88 (!) 166/84 (!) 181/74  Pulse: 73 (!) 102 (!) 25 85  Resp: 16  (!) 21 (!) 35  Temp:      TempSrc:      SpO2: 100% 99% (!) 36% 98%  Weight:      Height:       Neurology awake and alert, deconditioned  ENT with mild pallor Cardiovascular with S1 and S2 present and regular, positive systolic murmur at the right lower sternal border Respiratory with mild rales at  bases with no wheezing Abdomen with no distention  No lower extremity edema  Data Reviewed:   Family Communication: I spoke with patient's sister at the bedside, we talked in detail about patient's condition, plan of care and prognosis and all questions were addressed.   Disposition: Status is: Inpatient Remains inpatient appropriate because: renal replacement therapy IV antibiotics   Planned Discharge  Destination: Home     Author: Coralie Keens, MD 04/08/2023 4:56 PM  For on call review [email or web address].

## 2023-04-09 DIAGNOSIS — N186 End stage renal disease: Secondary | ICD-10-CM | POA: Diagnosis not present

## 2023-04-09 DIAGNOSIS — I10 Essential (primary) hypertension: Secondary | ICD-10-CM | POA: Diagnosis not present

## 2023-04-09 DIAGNOSIS — N390 Urinary tract infection, site not specified: Secondary | ICD-10-CM | POA: Diagnosis not present

## 2023-04-09 DIAGNOSIS — R1310 Dysphagia, unspecified: Secondary | ICD-10-CM | POA: Diagnosis not present

## 2023-04-09 DIAGNOSIS — F419 Anxiety disorder, unspecified: Secondary | ICD-10-CM

## 2023-04-09 LAB — CBC WITH DIFFERENTIAL/PLATELET
Abs Immature Granulocytes: 0.05 10*3/uL (ref 0.00–0.07)
Basophils Absolute: 0 10*3/uL (ref 0.0–0.1)
Basophils Relative: 0 %
Eosinophils Absolute: 0 10*3/uL (ref 0.0–0.5)
Eosinophils Relative: 0 %
HCT: 36 % (ref 36.0–46.0)
Hemoglobin: 11.6 g/dL — ABNORMAL LOW (ref 12.0–15.0)
Immature Granulocytes: 1 %
Lymphocytes Relative: 8 %
Lymphs Abs: 0.8 10*3/uL (ref 0.7–4.0)
MCH: 27.8 pg (ref 26.0–34.0)
MCHC: 32.2 g/dL (ref 30.0–36.0)
MCV: 86.3 fL (ref 80.0–100.0)
Monocytes Absolute: 0.4 10*3/uL (ref 0.1–1.0)
Monocytes Relative: 4 %
Neutro Abs: 8.3 10*3/uL — ABNORMAL HIGH (ref 1.7–7.7)
Neutrophils Relative %: 87 %
Platelets: 197 10*3/uL (ref 150–400)
RBC: 4.17 MIL/uL (ref 3.87–5.11)
RDW: 15.1 % (ref 11.5–15.5)
WBC: 9.5 10*3/uL (ref 4.0–10.5)
nRBC: 0 % (ref 0.0–0.2)

## 2023-04-09 LAB — BASIC METABOLIC PANEL
Anion gap: 8 (ref 5–15)
BUN: 15 mg/dL (ref 8–23)
CO2: 29 mmol/L (ref 22–32)
Calcium: 8.8 mg/dL — ABNORMAL LOW (ref 8.9–10.3)
Chloride: 100 mmol/L (ref 98–111)
Creatinine, Ser: 1.6 mg/dL — ABNORMAL HIGH (ref 0.44–1.00)
GFR, Estimated: 31 mL/min — ABNORMAL LOW (ref >60)
Glucose, Bld: 131 mg/dL — ABNORMAL HIGH (ref 70–99)
Potassium: 3.3 mmol/L — ABNORMAL LOW (ref 3.5–5.1)
Sodium: 137 mmol/L (ref 135–145)

## 2023-04-09 LAB — EXPECTORATED SPUTUM ASSESSMENT W GRAM STAIN, RFLX TO RESP C

## 2023-04-09 LAB — HEPATITIS B SURFACE ANTIGEN: Hepatitis B Surface Ag: NONREACTIVE

## 2023-04-09 MED ORDER — POTASSIUM CHLORIDE 20 MEQ PO PACK
20.0000 meq | PACK | Freq: Once | ORAL | Status: DC
  Filled 2023-04-09: qty 1

## 2023-04-09 MED ORDER — POTASSIUM CHLORIDE 20 MEQ PO PACK
20.0000 meq | PACK | Freq: Once | ORAL | Status: AC
  Administered 2023-04-09: 20 meq via ORAL
  Filled 2023-04-09: qty 1

## 2023-04-09 MED ORDER — ALPRAZOLAM 0.5 MG PO TABS
0.5000 mg | ORAL_TABLET | Freq: Three times a day (TID) | ORAL | Status: DC | PRN
  Administered 2023-04-09: 0.5 mg via ORAL
  Filled 2023-04-09: qty 1

## 2023-04-09 MED ORDER — PREDNISONE 20 MG PO TABS
20.0000 mg | ORAL_TABLET | Freq: Every day | ORAL | Status: AC
  Administered 2023-04-09 – 2023-04-10 (×2): 20 mg via ORAL
  Filled 2023-04-09 (×2): qty 1

## 2023-04-09 MED ORDER — ENSURE ENLIVE PO LIQD: 237.0000 mL | Freq: Two times a day (BID) | ORAL | Status: DC

## 2023-04-09 MED ORDER — NEPRO/CARBSTEADY PO LIQD
237.0000 mL | Freq: Two times a day (BID) | ORAL | Status: DC
  Administered 2023-04-10: 237 mL via ORAL

## 2023-04-09 NOTE — Progress Notes (Signed)
Progress Note   Patient: Lori Stanley [id number]:[id number] DOB: 1931/07/29 DOA: 04/07/2023     2 DOS: the patient was seen and examined on 04/09/2023   Brief hospital course: Mrs. Partington was admitted to the hospital with the working diagnosis of failure to thrive.   89 yo female with the past medical history of heart failure, hypertension, ESRD, and gastritis who presented with difficulty swallowing for the last 2 to 3 weeks prior to hospitalization. At home positive weight loss. Recent outpatient evaluation, recommended ENT referral for salivary gland enlargement. On her initial physical examination in the ED her blood pressure was 156/64, HR 76, RR 18 and 02 saturation 96%, lungs with no wheezing or rhonchi, heart with S1 and S2 present and regular, abdomen with no distention, positive mild pain to palpation mid right side, no lower extremity edema.   Na 139, K 2,6 Cl 100, bicarbonate 30, glucose 107, bun 22 cr 2,36  Wbc 8,0 hgb 12.3 plt 213  Sars covid 19 negative  Urine analysis SG 1,013, protein 100, leukocytes large, moderate hgb, > 50 rbc, > 50 wbc.   CT abdomen with cholelithiasis, colonic diverticulosis, moderate to marked severity diffuse gastric wall thickening with may represent gastritis.   CT soft tissue neck with limited evaluation. Advance degenerative changes in cervical spine.   Chest radiograph hypoinflation, cardiomegaly with elevation of right hemidiaphragm.   EKG 78 bpm, left axis deviation, left anterior fascicular block, sinus rhythm with no significant ST segment or T wave changes.   12/07 diet advance to dysphagia 2 diet. HD today.  12/08 patient continue very weak and deconditioned, placed on steroids for mild edema noted on laryngoscopy.  Plan for HD 12/09. TOC consulted for disposition.   Assessment and Plan: * UTI (urinary tract infection) Urine culture with 3.000 CF enterococcus, possible contamination.  Plan to continue current antibiotic therapy with  ceftriaxone for total of 3 days.   Acute hypoxemic respiratory failure has resolved with 02 saturation 100% on room air  Will continue oxymetry monitoring Incentive spirometer.  Pneumonia has been ruled out.   ESRD (end stage renal disease) (HCC) Hypokalemia   K today is 3,3 with serum bicarbonate at 29 and BUN at 15.   Patient will continue HD tomorrow, back on her schedule of M-W-F.  Added 20 meq Kcl today.  Patient on daily 100 mg of torsemide.  Patient with muscle mass loss, likely her dry weight has changed.   Dysphagia Continue pantoprazole for antiacid therapy Patient with significant cervical spine disease.  Tolerating dysphagia diet.  Per ENT recommendations will do a short course of prednisone for laryngeal edema.    Essential hypertension Continue blood pressure control with carvedilol.   Diabetes (HCC) Glucose cover and monitoring with insulin sliding scale.   Chronic diastolic CHF (congestive heart failure) (HCC) No signs of acute exacerbation, continue renal replacement therapy with ultrafiltration.  Likely her dry weight has changed due to loss of  muscle mass.  Continue carvedilol and torsemide.   Anxiety Continue sertraline and trazodone.  Added as needed alprazolam for anxiety.         Subjective: Patient with improvement in mentation, tolerating well dysphagia diet. Continue very weak and deconditioned   Physical Exam: Vitals:   04/08/23 2307 04/08/23 2314 04/08/23 2346 04/09/23 0829  BP: (!) 136/55  128/60 (!) 156/70  Pulse: 74  78 80  Resp: 18  18 16   Temp: 97.6 F (36.4 C)  97.7 F (36.5 C) 98 F (36.7  C)  TempSrc: Oral   Oral  SpO2: 97%  97% 97%  Weight:  43.6 kg    Height:       Neurology awake and alert ENT with mild pallor Cardiovascular with S1 and S2 present and regular, positive systolic murmur at the apex No JVD No lower extremity edema Respiratory with no rales or wheezing, no rhonchi Abdomen with no distention  Data  Reviewed:    Family Communication: I spoke with patient's nice at the bedside, we talked in detail about patient's condition, plan of care and prognosis and all questions were addressed.   Disposition: Status is: Inpatient Remains inpatient appropriate because: renal replacement therapy and IV antibiotics   Planned Discharge Destination: Home      Author: Coralie Keens, MD 04/09/2023 1:17 PM  For on call review [email or web address].

## 2023-04-09 NOTE — Plan of Care (Signed)

## 2023-04-09 NOTE — TOC PASRR Note (Signed)
To Whom It May Concern:  Please be advised that the above-named patient will require a short-term nursing home stay - anticipated 30 days or less for rehabilitation and strengthening.  The plan is for return home.  

## 2023-04-09 NOTE — TOC Initial Note (Addendum)
Transition of Care [facility]) - Initial/Assessment Note    Patient Details  Name: Lori Stanley MRN: [id number] Date of Birth: [birth year]  Transition of Care [facility]) CM/SW Contact:    Liliana Cline, LCSW Phone Number: 04/09/2023, 2:35 PM  Clinical Narrative:                 Spoke with PTA who worked with patient today. Patient requiring heavier assist than yesterday, and presenting with confusion. Patient will need 24 hour supervision at home or SNF.   CSW called patient's niece Solon Palm to discuss DC planning. Solon Palm states there is no family who could provide 24 hour care at patient's home. She states both nieces work, and patient's sister is frail herself. Solon Palm states they feel that patient needs STR to get stronger before returning home. She states her first choice is [facility]. She states she feels patient will need help at home once she leaves rehab. Discussed Home Health as well as option of applying for PCS through DSS.  SNF work up started. PASRR PENDING- info has been uploaded.  Expected Discharge Plan: Skilled Nursing Facility Barriers to Discharge: Continued Medical Work up   Patient Goals and CMS Choice   CMS Medicare.gov Compare Post Acute Care list provided to:: Patient Represenative (must comment) Choice offered to / list presented to :  (niece)      Expected Discharge Plan and Services       Living arrangements for the past 2 months: Single Family Home                                      Prior Living Arrangements/Services Living arrangements for the past 2 months: Single Family Home Lives with:: Self Patient language and need for interpreter reviewed:: Yes        Need for Family Participation in Patient Care: Yes (Comment) Care giver support system in place?: Yes (comment) Current home services: DME Criminal Activity/Legal Involvement Pertinent to Current Situation/Hospitalization: No - Comment as needed  Activities of Daily Living    ADL Screening (condition at time of admission) Independently performs ADLs?: No Does the patient have a NEW difficulty with bathing/dressing/toileting/self-feeding that is expected to last >3 days?: Yes (Initiates electronic notice to provider for possible OT consult) Does the patient have a NEW difficulty with getting in/out of bed, walking, or climbing stairs that is expected to last >3 days?: Yes (Initiates electronic notice to provider for possible PT consult) Does the patient have a NEW difficulty with communication that is expected to last >3 days?: No Is the patient deaf or have difficulty hearing?: No Does the patient have difficulty seeing, even when wearing glasses/contacts?: No Does the patient have difficulty concentrating, remembering, or making decisions?: No  Permission Sought/Granted                  Emotional Assessment       Orientation: : Fluctuating Orientation (Suspected and/or reported Sundowners) Alcohol / Substance Use: Not Applicable Psych Involvement: No (comment)  Admission diagnosis:  Hypokalemia [E87.6] Pericardial effusion [I31.39] Pneumonia [J18.9] Failure to thrive in adult [R62.7] Urinary tract infection without hematuria, site unspecified [N39.0] Community acquired pneumonia, unspecified laterality [J18.9] Patient Active Problem List   Diagnosis Date Noted   Pneumonia 04/07/2023   Dysphagia 04/07/2023   UTI (urinary tract infection) 04/07/2023   Enlarged salivary gland 04/06/2023   Severe protein-calorie malnutrition (HCC) 04/06/2023  Weight loss, non-intentional 04/06/2023   Encounter for annual wellness visit (AWV) in Medicare patient 02/02/2023   Memory change 01/28/2022   Primary osteoarthritis of both knees 01/27/2022   ABLA (acute blood loss anemia) 11/27/2021   GI bleed 11/26/2021   Chronic diastolic CHF (congestive heart failure) (HCC) 07/11/2020   Incarcerated left inguinal hernia 07/11/2020   SBO (small bowel obstruction)  (HCC)    Anemia due to stage 4 chronic kidney disease (HCC) 08/27/2019   ESRD (end stage renal disease) (HCC)    Anemia in chronic kidney disease 01/23/2019   Secondary hyperparathyroidism of renal origin (HCC) 01/23/2019   Acute respiratory failure with hypoxia (HCC) 04/09/2017   Syncopal episodes 12/15/2015   Anemia of chronic disease 04/14/2015   Anxiety 04/14/2015   Clinical depression 04/14/2015   Essential hypertension 04/14/2015   Arthritis, degenerative 04/14/2015   Allergic rhinitis 04/14/2015   Hyperlipidemia 02/27/2015   Glaucoma 12/23/2014   Diabetes (HCC) 12/23/2014   GERD (gastroesophageal reflux disease) 12/23/2014   PCP:  [name], MD Pharmacy:   [facility] [address], [place] - [address] [place] [address] Phone: [phone] Fax: [phone]     Social Determinants of Health (SDOH) Social History: SDOH Screenings   Food Insecurity: No Food Insecurity (04/08/2023)  Housing: Low Risk  (04/08/2023)  Transportation Needs: No Transportation Needs (04/08/2023)  Utilities: Not At Risk (04/08/2023)  Alcohol Screen: Low Risk  (02/02/2023)  Depression (PHQ2-9): Low Risk  (04/06/2023)  Financial Resource Strain: Low Risk  (11/01/2022)  Physical Activity: Inactive (11/01/2022)  Social Connections: Socially Isolated (11/01/2022)  Stress: No Stress Concern Present (11/01/2022)  Tobacco Use: Medium Risk (04/06/2023)   SDOH Interventions:     Readmission Risk Interventions    04/09/2023    2:34 PM 07/20/2020    3:45 PM  Readmission Risk Prevention Plan  Transportation Screening Complete Complete  PCP or Specialist Appt within 5-7 Days Complete   PCP or Specialist Appt within 3-5 Days  Complete  Home Care Screening Complete   Medication Review (RN CM) Complete   HRI or Home Care Consult  Complete  Palliative Care Screening  Not Applicable  Medication Review (RN Care Manager)  Complete

## 2023-04-09 NOTE — Consult Note (Signed)
PHARMACY CONSULT NOTE - ELECTROLYTES  Pharmacy Consult for Electrolyte Monitoring and Replacement   Recent Labs: Height: 5\' 2"  (157.5 cm) Weight: 43.6 kg (96 lb 1.9 oz) IBW/kg (Calculated) : 50.1 Estimated Creatinine Clearance: 16.4 mL/min (A) (by C-G formula based on SCr of 1.6 mg/dL (H)).  Potassium (mmol/L)  Date Value  04/09/2023 3.3 (L)  05/15/2013 4.0   Magnesium (mg/dL)  Date Value  40/98/1191 2.1   Calcium (mg/dL)  Date Value  47/82/9562 8.8 (L)   Calcium, Total (mg/dL)  Date Value  13/11/6576 8.0 (L)   Albumin (g/dL)  Date Value  46/96/2952 2.9 (L)  04/06/2023 3.8  05/11/2013 3.6   Phosphorus (mg/dL)  Date Value  84/13/2440 3.4   Sodium (mmol/L)  Date Value  04/09/2023 137  04/06/2023 141  05/15/2013 132 (L)   Assessment  Lori Stanley is a 89 y.o. female presenting with failure to thrive. Pharmacy has been consulted to monitor and replace electrolytes. ESRD on hemodialysis    Pertinent medications: torsemide 100 mg po daily  Goal of Therapy: Electrolytes WNL  Plan:  Will order KCL 20 meq po packet x1 Check BMP, Mg, Phos with AM labs  Thank you for allowing pharmacy to be a part of this patient's care.  Lowella Bandy, PharmD 04/09/2023 7:08 AM

## 2023-04-09 NOTE — Progress Notes (Addendum)
[facility]  ROUNDING NOTE   Subjective:   Lori Stanley was admitted to [facility] on 04/07/2023 for Hypokalemia [E87.6] Pericardial effusion [I31.39] Pneumonia [J18.9] Failure to thrive in adult [R62.7] Urinary tract infection without hematuria, site unspecified [N39.0] Community acquired pneumonia, unspecified laterality [J18.9]  Last hemodialysis treatment was Sat 12/7 Update: Patient sitting up in room comfortably. Patient denies shortness of breath or pain. On 2.5L [place]. Uses intemittnet oxygen at home as needed. Reports continued discomfort with throat but tolerating soft foods.      Objective:  Vital signs in last 24 hours:  Temp:  [97.6 F (36.4 C)-98.2 F (36.8 C)] 98 F (36.7 C) (12/08 0829) Pulse Rate:  [25-102] 80 (12/08 0829) Resp:  [16-35] 16 (12/08 0829) BP: (121-192)/(55-88) 156/70 (12/08 0829) SpO2:  [36 %-100 %] 97 % (12/08 0829) Weight:  [36.5 kg-44.6 kg] 43.6 kg (12/07 2314)  Weight change:  Filed Weights   04/08/23 1800 04/08/23 1922 04/08/23 2314  Weight: 36.5 kg 44.6 kg 43.6 kg    Intake/Output: I/O last 3 completed shifts: In: 220 [P.O.:120; IV Piggyback:100] Out: 940 [Other:940]   Intake/Output this shift:  No intake/output data recorded.  Physical Exam: General: NAD,   Head: +dysphonic voice   Eyes: Anicteric, PERRL  Neck: Supple, trachea midline  Lungs:  Clear to auscultation  Heart: Regular rate and rhythm  Abdomen:  Soft, nontender,   Extremities:  no peripheral edema.  Neurologic: Nonfocal, moving all four extremities  Skin: No lesions  Access: AVF    Basic Metabolic Panel: Recent Labs  Lab 04/06/23 1159 04/06/23 1653 04/06/23 1654 04/08/23 0313 04/09/23 0313  NA 141  --  139 143 137  K 3.5  --  2.6* 3.1* 3.3*  CL 97  --  100 108 100  CO2 28  --  30 27 29   GLUCOSE 88  --  107* 102* 131*  BUN 20  --  22 29* 15  CREATININE 2.16*  --  2.36* 2.52* 1.60*  CALCIUM 10.3  --  9.1 8.7* 8.8*  MG  --  1.8  --   2.1  --   PHOS  --  3.6  --  3.4  --     Liver Function Tests: Recent Labs  Lab 04/06/23 1159 04/08/23 0313  AST 23 18  ALT 17 15  ALKPHOS 275* 194*  BILITOT 0.3 0.7  PROT 6.6 5.9*  ALBUMIN 3.8 2.9*   Recent Labs  Lab 04/06/23 1159  LIPASE 24   No results for input(s): "AMMONIA" in the last 168 hours.  CBC: Recent Labs  Lab 04/06/23 1159 04/06/23 1654 04/08/23 0313 04/09/23 0313  WBC 6.8 8.0 12.2* 9.5  NEUTROABS  --  6.7  --  8.3*  HGB 12.5 12.3 11.6* 11.6*  HCT 40.8 38.9 37.0 36.0  MCV 90 89.4 87.9 86.3  PLT 238 213 212 197    Cardiac Enzymes: No results for input(s): "CKTOTAL", "CKMB", "CKMBINDEX", "TROPONINI" in the last 168 hours.  BNP: Invalid input(s): "POCBNP"  CBG: No results for input(s): "GLUCAP" in the last 168 hours.  Microbiology: Results for orders placed or performed during the hospital encounter of 04/07/23  Culture, blood (routine x 2)     Status: None (Preliminary result)   Collection Time: 04/07/23  3:04 AM   Specimen: BLOOD  Result Value Ref Range Status   Specimen Description BLOOD BLOOD RIGHT ARM  Final   Special Requests   Final    BOTTLES DRAWN AEROBIC AND  ANAEROBIC Blood Culture adequate volume   Culture   Final    NO GROWTH 2 DAYS Performed at [facility], [address] [place], [place] [address]    Report Status PENDING  Incomplete  Culture, blood (routine x 2)     Status: Abnormal   Collection Time: 04/07/23  3:40 AM   Specimen: BLOOD  Result Value Ref Range Status   Specimen Description   Final    BLOOD BLOOD RIGHT FOREARM Performed at [facility], [address] [place], [place] [address]    Special Requests   Final    BOTTLES DRAWN AEROBIC AND ANAEROBIC Blood Culture adequate volume Performed at [facility], [address] [place], [place] [address]    Culture  Setup Time   Final    GRAM POSITIVE COCCI ANAEROBIC BOTTLE ONLY CRITICAL RESULT CALLED TO, READ BACK BY AND  VERIFIED WITH: EMILY STEINBOCK @ 2104 04/07/23 LFD  Performed at [facility], [address] [place], [place] [address]    Culture STREPTOCOCCUS ANGINOSIS (A)  Final   Report Status 04/09/2023 FINAL  Final   Organism ID, Bacteria STREPTOCOCCUS ANGINOSIS  Final      Susceptibility   Streptococcus anginosis - MIC*    PENICILLIN 0.5 INTERMEDIATE Intermediate     CEFTRIAXONE <=0.12 SENSITIVE Sensitive     ERYTHROMYCIN <=0.12 SENSITIVE Sensitive     VANCOMYCIN <=0.12 SENSITIVE Sensitive     * STREPTOCOCCUS ANGINOSIS  Blood Culture ID Panel (Reflexed)     Status: Abnormal   Collection Time: 04/07/23  3:40 AM  Result Value Ref Range Status   Enterococcus faecalis NOT DETECTED NOT DETECTED Final   Enterococcus Faecium NOT DETECTED NOT DETECTED Final   Listeria monocytogenes NOT DETECTED NOT DETECTED Final   Staphylococcus species NOT DETECTED NOT DETECTED Final   Staphylococcus aureus (BCID) NOT DETECTED NOT DETECTED Final   Staphylococcus epidermidis NOT DETECTED NOT DETECTED Final   Staphylococcus lugdunensis NOT DETECTED NOT DETECTED Final   Streptococcus species DETECTED (A) NOT DETECTED Final    Comment: Not Enterococcus species, Streptococcus agalactiae, Streptococcus pyogenes, or Streptococcus pneumoniae. EMILY STEINBOCK @ 2104 04/07/23 LFD    Streptococcus agalactiae NOT DETECTED NOT DETECTED Final   Streptococcus pneumoniae NOT DETECTED NOT DETECTED Final   Streptococcus pyogenes NOT DETECTED NOT DETECTED Final   A.calcoaceticus-baumannii NOT DETECTED NOT DETECTED Final   Bacteroides fragilis NOT DETECTED NOT DETECTED Final   Enterobacterales NOT DETECTED NOT DETECTED Final   Enterobacter cloacae complex NOT DETECTED NOT DETECTED Final   Escherichia coli NOT DETECTED NOT DETECTED Final   Klebsiella aerogenes NOT DETECTED NOT DETECTED Final   Klebsiella oxytoca NOT DETECTED NOT DETECTED Final   Klebsiella pneumoniae NOT DETECTED NOT DETECTED Final   Proteus species NOT  DETECTED NOT DETECTED Final   Salmonella species NOT DETECTED NOT DETECTED Final   Serratia marcescens NOT DETECTED NOT DETECTED Final   Haemophilus influenzae NOT DETECTED NOT DETECTED Final   Neisseria meningitidis NOT DETECTED NOT DETECTED Final   Pseudomonas aeruginosa NOT DETECTED NOT DETECTED Final   Stenotrophomonas maltophilia NOT DETECTED NOT DETECTED Final   Candida albicans NOT DETECTED NOT DETECTED Final   Candida auris NOT DETECTED NOT DETECTED Final   Candida glabrata NOT DETECTED NOT DETECTED Final   Candida krusei NOT DETECTED NOT DETECTED Final   Candida parapsilosis NOT DETECTED NOT DETECTED Final   Candida tropicalis NOT DETECTED NOT DETECTED Final   Cryptococcus neoformans/gattii NOT DETECTED NOT DETECTED Final  Comment: Performed at [facility], [address], [place], [place] [address]  Urine Culture     Status: Abnormal (Preliminary result)   Collection Time: 04/07/23  3:53 AM   Specimen: In/Out Cath Urine  Result Value Ref Range Status   Specimen Description   Final    IN/OUT CATH URINE Performed at [facility], [address] [place], [place] [address]    Special Requests   Final    NONE Performed at [facility], [address], [place], [place] [address]    Culture (A)  Final    3,000 COLONIES/mL ENTEROCOCCUS FAECALIS CULTURE REINCUBATED FOR BETTER GROWTH Performed at [facility], [address] [place], [place] [address]    Report Status PENDING  Incomplete   Organism ID, Bacteria ENTEROCOCCUS FAECALIS (A)  Final      Susceptibility   Enterococcus faecalis - MIC*    AMPICILLIN <=2 SENSITIVE Sensitive     VANCOMYCIN 1 SENSITIVE Sensitive     * 3,000 COLONIES/mL ENTEROCOCCUS FAECALIS  Resp panel by RT-PCR (RSV, Flu A&B, Covid) Anterior Nasal Swab     Status: None   Collection Time: 04/07/23  3:53 AM   Specimen: Anterior Nasal Swab  Result Value Ref Range Status   SARS Coronavirus 2 by RT PCR NEGATIVE  NEGATIVE Final    Comment: (NOTE) SARS-CoV-2 target nucleic acids are NOT DETECTED.  The SARS-CoV-2 RNA is generally detectable in upper respiratory specimens during the acute phase of infection. The lowest concentration of SARS-CoV-2 viral copies this assay can detect is 138 copies/mL. A negative result does not preclude SARS-Cov-2 infection and should not be used as the sole basis for treatment or other patient management decisions. A negative result may occur with  improper specimen collection/handling, submission of specimen other than nasopharyngeal swab, presence of viral mutation(s) within the areas targeted by this assay, and inadequate number of viral copies(<138 copies/mL). A negative result must be combined with clinical observations, patient history, and epidemiological information. The expected result is Negative.  Fact Sheet for Patients:  BloggerCourse.com  Fact Sheet for Healthcare Providers:  SeriousBroker.it  This test is no t yet approved or cleared by the [place] FDA and  has been authorized for detection and/or diagnosis of SARS-CoV-2 by FDA under an Emergency Use Authorization (EUA). This EUA will remain  in effect (meaning this test can be used) for the duration of the COVID-19 declaration under Section 564(b)(1) of the Act, 21 U.S.C.section 360bbb-3(b)(1), unless the authorization is terminated  or revoked sooner.       Influenza A by PCR NEGATIVE NEGATIVE Final   Influenza B by PCR NEGATIVE NEGATIVE Final    Comment: (NOTE) The Xpert Xpress SARS-CoV-2/FLU/RSV plus assay is intended as an aid in the diagnosis of influenza from Nasopharyngeal swab specimens and should not be used as a sole basis for treatment. Nasal washings and aspirates are unacceptable for Xpert Xpress SARS-CoV-2/FLU/RSV testing.  Fact Sheet for Patients: BloggerCourse.com  Fact Sheet for Healthcare  Providers: SeriousBroker.it  This test is not yet approved or cleared by the [place] FDA and has been authorized for detection and/or diagnosis of SARS-CoV-2 by FDA under an Emergency Use Authorization (EUA). This EUA will remain in effect (meaning this test can be used) for the duration of the COVID-19 declaration under Section 564(b)(1) of the Act, 21 U.S.C. section 360bbb-3(b)(1), unless the authorization is terminated or revoked.     Resp Syncytial Virus by PCR NEGATIVE NEGATIVE Final  Comment: (NOTE) Fact Sheet for Patients: BloggerCourse.com  Fact Sheet for Healthcare Providers: SeriousBroker.it  This test is not yet approved or cleared by the [place] FDA and has been authorized for detection and/or diagnosis of SARS-CoV-2 by FDA under an Emergency Use Authorization (EUA). This EUA will remain in effect (meaning this test can be used) for the duration of the COVID-19 declaration under Section 564(b)(1) of the Act, 21 U.S.C. section 360bbb-3(b)(1), unless the authorization is terminated or revoked.  Performed at [facility], [address] [place], [place] [address]     Coagulation Studies: No results for input(s): "LABPROT", "INR" in the last 72 hours.  Urinalysis: Recent Labs    04/06/23 1147 04/07/23 0353  COLORURINE  --  YELLOW*  LABSPEC  --  1.013  PHURINE  --  6.0  GLUCOSEU  --  NEGATIVE  HGBUR  --  MODERATE*  BILIRUBINUR Negative NEGATIVE  KETONESUR  --  NEGATIVE  PROTEINUR Positive* 100*  UROBILINOGEN 0.2  --   NITRITE Negative NEGATIVE  LEUKOCYTESUR Large (3+)* LARGE*      Imaging: CT SOFT TISSUE NECK WO CONTRAST  Result Date: 04/07/2023 CLINICAL DATA:  Enlarged salivary gland, infection suspected EXAM: CT NECK WITHOUT CONTRAST TECHNIQUE: Multidetector CT imaging of the neck was performed following the standard protocol without intravenous  contrast. RADIATION DOSE REDUCTION: This exam was performed according to the departmental dose-optimization program which includes automated exposure control, adjustment of the mA and/or kV according to patient size and/or use of iterative reconstruction technique. COMPARISON:  None Available. FINDINGS: Evaluation is somewhat limited by the absence of intravenous contrast and patient positioning. Pharynx and larynx: Normal. No mass or swelling. Salivary glands: No acute finding in the parotid glands. Poor visualization of the submandibular glands but no definite mass or stone is seen. Thyroid: Normal. Lymph nodes: None enlarged or abnormal density. Vascular: Vascular calcifications, including severe coronary and aortic atherosclerotic calcifications. Limited intracranial: Negative. Visualized orbits: No acute finding. Status post bilateral lens replacements. Mastoids and visualized paranasal sinuses: Clear. Skeleton: Advanced degenerative changes in the shoulders, with advanced joint space narrowing, subchondral sclerosis and subchondral cystic changes, with bony erosion in both glenohumeral joints. Fluid in the right glenohumeral joint, extending into the subacromial/subdeltoid bursa, likely with full-thickness superior rotator cuff tear. Advanced degenerative changes in the cervical spine, with vertebral body height loss at C7 and T1, which appears chronic. Pannus formation at C1-C2. Osseous fusion of T1 through T8, likely extending more inferiorly off field of view. Upper chest: Emphysema. No focal pulmonary opacity or pleural effusion. IMPRESSION: 1. Evaluation is limited by the absence of intravenous contrast and patient positioning. Within this limitation, no acute finding in the neck. Poor visualization of the submandibular glands but no definite mass or stone is seen. If there is persistent concern, consider ultrasound or contrasted neck CT. 2. Advanced degenerative changes in the shoulders, with bony  erosion in both glenohumeral joints and a right glenohumeral joint effusion with subacromial/subdeltoid bursal fluid. CT cannot determine whether this fluid is inflammatory or infected. 3. Advanced degenerative changes in the cervical spine, with vertebral body height loss at C7 and T1, which appears chronic. 4. Severe coronary and aortic atherosclerotic calcifications. Aortic Atherosclerosis (ICD10-I70.0) and Emphysema (ICD10-J43.9). Electronically Signed   By: Wiliam Ke M.D.   On: 04/07/2023 14:12     Medications:    cefTRIAXone (ROCEPHIN)  IV 2 g (04/09/23 0517)    brimonidine  1 drop Both Eyes BID   carvedilol  3.125  mg Oral BID   heparin  5,000 Units Subcutaneous Q12H   multivitamin  1 tablet Oral Daily   pantoprazole  40 mg Oral Daily   sertraline  25 mg Oral Daily   torsemide  100 mg Oral Daily   acetaminophen, ondansetron **OR** ondansetron (ZOFRAN) IV, traZODone  Assessment/ Plan:  Lori Stanley is a 89 y.o.  female wth end stage renal disease on hemodialysis, hypertension, congestive heart failure, depression, GERD who presents to [facility] with 04/07/2023 with Hypokalemia [E87.6] Pericardial effusion [I31.39] Pneumonia [J18.9] Failure to thrive in adult [R62.7] Urinary tract infection without hematuria, site unspecified [N39.0] Community acquired pneumonia, unspecified laterality [J18.9]  [facility] MWF Left AVF 43kg  End Stage Renal Disease: missed dialysis yesterday. Plan on dialysis tomorrow to resume MWF schedule. Hypertension: 156/70. Elevated.Improving with dialysis Continue torsemide.  Anemia with chronic kidney disease: mircera as outpatient. Hemoglobin stable at 11.6 Secondary Hyperparathyroidism: currently holding phosphate binders. Gets parsabiv outpatient.    LOS: 2 [place] Thum Tonny Bollman 12/8/202410:49 AM

## 2023-04-09 NOTE — Progress Notes (Signed)
Physical Therapy Treatment Patient Details Name: Lori Stanley MRN: [id number] DOB: 12-May-1931 Today's Date: 04/09/2023   History of Present Illness 89 y.o. female with PMHx of HFpEF, ESRD on hemodialysis Monday Wednesday Friday, GERD, hypertension presenting with failure to thrive, pneumonia, gastritis, Cholelithiasis and Colonic diverticulosis and UTI.    PT Comments  Pt seen this pm to assist with discharge planning.  Pt has been declining SNF stating she has help at home.  She is found in bed with her O2 removed and drink spilt in bed.  Sats 93% on room air at rest so session is continued without O2 for qualifying sats.  She has trouble transitioning to EOB today and despite increased time and cues she need heavy mod a x 1 to get fully to EOB.  Generally steady in sitting unsupported.  She stands to RW with min a x 1 and takes several poor quality steps with mod cues to turn.  She does not feel she can walk further at this time so is cues to sit in chair.  She tries to turn back to bed and is again cued to sit in chair which she has trouble reaching back and then initiating transition to sitting needing assist to safely guide her to chair.  Once sitting she starts playing with her fingers and bringing them to her mouth.  When asked what she is doing she stated she is eating her food.  I told her her food was on her tray and she keeps "eating" her fingers.  She is given pudding with a spoon and she does fair with it but tech is aware and will assist.  Sats 87% on room air once seated and O2 is replaced.    SaO2 on room air at rest = 93% SaO2 on room air while ambulating =  87% SaO2 on 2 liters of O2 while ambulating =  93%  - did not walk as she was unable but O2 slowly returning to baseline with support.  Pt did seem to struggle more.  Discussed with TOC and MD.  Will keep recommendations for SNF.  If pt does return home she will need +24 hour support and hands on assist for mobility and ADL  tasks.  If she does not have a wheelchair at home, one would be recommended as she has not been able to ambulate household distances.  She will need assist to navigate with wheelchair at home.    Patient suffers from failure to thrive/general weakness which impairs his/her ability to perform daily activities like toileting, feeding, dressing, grooming, bathing in the home. A cane, walker, crutch will not resolve the patient's issue with performing activities of daily living. A lightweight wheelchair and cushion is required/recommended and will allow patient to safely perform daily activities.   Patient can safely propel the wheelchair in the home or has a caregiver who can provide assistance.     If plan is discharge home, recommend the following: Assistance with cooking/housework;Direct supervision/assist for financial management;Assist for transportation;Direct supervision/assist for medications management;Help with stairs or ramp for entrance;A lot of help with walking and/or transfers;A lot of help with bathing/dressing/bathroom   Can travel by private vehicle        Equipment Recommendations  Wheelchair (measurements PT);Wheelchair cushion (measurements PT);Rolling walker (2 wheels);BSC/3in1    Recommendations for Other Services       Precautions / Restrictions Precautions Precautions: Fall Restrictions Weight Bearing Restrictions: No     Mobility  Bed Mobility Overal bed  mobility: Needs Assistance Bed Mobility: Supine to Sit     Supine to sit: Mod assist     General bed mobility comments: heavy cues and assist to transition to EOB.  given time and cues but seems to rely on writer for assist.    Transfers Overall transfer level: Needs assistance Equipment used: Rolling walker (2 wheels) Transfers: Sit to/from Stand Sit to Stand: Min assist, From elevated surface                Ambulation/Gait Ambulation/Gait assistance: Editor, commissioning (Feet): 3  Feet Assistive device: Rolling walker (2 wheels) Gait Pattern/deviations: Step-through pattern, Trunk flexed Gait velocity: decreased     General Gait Details: poor quality, slow steps to chair and generally seemed confused with directions   Stairs             Wheelchair Mobility     Tilt Bed    Modified Rankin (Stroke Patients Only)       Balance Overall balance assessment: Needs assistance Sitting-balance support: Feet supported, Bilateral upper extremity supported Sitting balance-Leahy Scale: Fair     Standing balance support: Bilateral upper extremity supported, Reliant on assistive device for balance, During functional activity Standing balance-Leahy Scale: Poor Standing balance comment: +1 hands on assist with mobility for safety and balance                            Cognition Arousal: Alert Behavior During Therapy: Anxious Overall Cognitive Status: No family/caregiver present to determine baseline cognitive functioning                                 General Comments: seemed more confused this session        Exercises      General Comments        Pertinent Vitals/Pain Pain Assessment Pain Assessment: No/denies pain Pain Intervention(s): Monitored during session    Home Living                          Prior Function            PT Goals (current goals can now be found in the care plan section) Progress towards PT goals: Not progressing toward goals - comment    Frequency    Min 1X/week      PT Plan      Co-evaluation              AM-PAC PT "6 Clicks" Mobility   Outcome Measure  Help needed turning from your back to your side while in a flat bed without using bedrails?: A Lot Help needed moving from lying on your back to sitting on the side of a flat bed without using bedrails?: A Lot Help needed moving to and from a bed to a chair (including a wheelchair)?: A Lot Help needed standing  up from a chair using your arms (e.g., wheelchair or bedside chair)?: A Lot Help needed to walk in hospital room?: A Lot Help needed climbing 3-5 steps with a railing? : Total 6 Click Score: 11    End of Session Equipment Utilized During Treatment: Gait belt;Oxygen Activity Tolerance: Patient tolerated treatment well Patient left: in chair;with call bell/phone within reach;with chair alarm set Nurse Communication: Mobility status PT Visit Diagnosis: Unsteadiness on feet (R26.81);Muscle weakness (generalized) (M62.81)  Time: 1314-1330 PT Time Calculation (min) (ACUTE ONLY): 16 min  Charges:    $Therapeutic Activity: 8-22 mins PT General Charges $$ ACUTE PT VISIT: 1 Visit                   Danielle Dess, PTA 04/09/23, 1:46 PM

## 2023-04-09 NOTE — Assessment & Plan Note (Signed)
Continue sertraline and trazodone.  Added as needed alprazolam for anxiety.

## 2023-04-09 NOTE — [place] FL2 (Signed)
MEDICAID FL2 LEVEL OF CARE FORM     IDENTIFICATION  Patient Name: Lori Stanley Birthdate: 08-Jun-1931 Sex: female Admission Date (Current Location): 04/07/2023  [facility] and [place] Number:  Chiropodist and Address:  [facility], [address] [place], [place] [address]      Provider Number: [id number]  Attending Physician Name and Address:  Coralie Keens,*  Relative Name and Phone Number:  Ross,Mahalia (Niece)  [phone] [facility])    Current Level of Care: Hospital Recommended Level of Care: Skilled Nursing Facility Prior Approval Number:    Date Approved/Denied:   PASRR Number: pending  Discharge Plan:      Current Diagnoses: Patient Active Problem List   Diagnosis Date Noted   Pneumonia 04/07/2023   Dysphagia 04/07/2023   UTI (urinary tract infection) 04/07/2023   Enlarged salivary gland 04/06/2023   Severe protein-calorie malnutrition (HCC) 04/06/2023   Weight loss, non-intentional 04/06/2023   Encounter for annual wellness visit (AWV) in Medicare patient 02/02/2023   Memory change 01/28/2022   Primary osteoarthritis of both knees 01/27/2022   ABLA (acute blood loss anemia) 11/27/2021   GI bleed 11/26/2021   Chronic diastolic CHF (congestive heart failure) (HCC) 07/11/2020   Incarcerated left inguinal hernia 07/11/2020   SBO (small bowel obstruction) (HCC)    Anemia due to stage 4 chronic kidney disease (HCC) 08/27/2019   ESRD (end stage renal disease) (HCC)    Anemia in chronic kidney disease 01/23/2019   Secondary hyperparathyroidism of renal origin (HCC) 01/23/2019   Acute respiratory failure with hypoxia (HCC) 04/09/2017   Syncopal episodes 12/15/2015   Anemia of chronic disease 04/14/2015   Anxiety 04/14/2015   Clinical depression 04/14/2015   Essential hypertension 04/14/2015   Arthritis, degenerative 04/14/2015   Allergic rhinitis 04/14/2015   Hyperlipidemia 02/27/2015   Glaucoma  12/23/2014   Diabetes (HCC) 12/23/2014   GERD (gastroesophageal reflux disease) 12/23/2014    Orientation RESPIRATION BLADDER Height & Weight     Self, Time, Situation, Place  O2 Continent (oliguria) Weight: 96 lb 1.9 oz (43.6 kg) Height:  5\' 2"  (157.5 cm)  BEHAVIORAL SYMPTOMS/MOOD NEUROLOGICAL BOWEL NUTRITION STATUS      Continent Diet (dys 2)  AMBULATORY STATUS COMMUNICATION OF NEEDS Skin   Extensive Assist Verbally Other (Comment) (dry/ flaky)                       Personal Care Assistance Level of Assistance  Bathing, Dressing, Feeding Bathing Assistance: Maximum assistance Feeding assistance: Maximum assistance Dressing Assistance: Maximum assistance     Functional Limitations Info             SPECIAL CARE FACTORS FREQUENCY  PT (By licensed PT), OT (By licensed OT)     PT Frequency: 5 times per week OT Frequency: 5 times per week            Contractures      Additional Factors Info  Code Status, Allergies Code Status Info: full Allergies Info: Antihistamines, Chlorpheniramine-type, Other, Paxil (Paroxetine), Codeine           Current Medications (04/09/2023):  This is the current hospital active medication list Current Facility-Administered Medications  Medication Dose Route Frequency Provider Last Rate Last Admin   acetaminophen (TYLENOL) tablet 650 mg  650 mg Oral Q6H PRN Andris Baumann, MD       ALPRAZolam Prudy Feeler) tablet 0.5 mg  0.5 mg Oral TID PRN Arrien, York Ram, MD   0.5  mg at 04/09/23 1242   brimonidine (ALPHAGAN) 0.2 % ophthalmic solution 1 drop  1 drop Both Eyes BID Arrien, York Ram, MD   1 drop at 04/09/23 1231   carvedilol (COREG) tablet 3.125 mg  3.125 mg Oral BID Coralie Keens, MD   3.125 mg at 04/09/23 1022   cefTRIAXone (ROCEPHIN) 2 g in sodium chloride 0.9 % 100 mL IVPB  2 g Intravenous Q24H Floydene Flock, MD 200 mL/hr at 04/09/23 0517 2 g at 04/09/23 0517   heparin injection 5,000 Units  5,000 Units  Subcutaneous Q12H Floydene Flock, MD   5,000 Units at 04/09/23 1022   multivitamin (RENA-VIT) tablet 1 tablet  1 tablet Oral Daily Arrien, York Ram, MD   1 tablet at 04/09/23 1022   ondansetron (ZOFRAN) tablet 4 mg  4 mg Oral Q6H PRN Floydene Flock, MD       Or   ondansetron [facility]) injection 4 mg  4 mg Intravenous Q6H PRN Floydene Flock, MD       pantoprazole (PROTONIX) EC tablet 40 mg  40 mg Oral Daily Arrien, York Ram, MD   40 mg at 04/09/23 1022   predniSONE (DELTASONE) tablet 20 mg  20 mg Oral Q breakfast Arrien, York Ram, MD   20 mg at 04/09/23 1242   sertraline (ZOLOFT) tablet 25 mg  25 mg Oral Daily Arrien, York Ram, MD   25 mg at 04/09/23 1022   torsemide (DEMADEX) tablet 100 mg  100 mg Oral Daily Arrien, York Ram, MD   100 mg at 04/09/23 1022   traZODone (DESYREL) tablet 150 mg  150 mg Oral QHS PRN Arrien, York Ram, MD         Discharge Medications: Please see discharge summary for a list of discharge medications.  Relevant Imaging Results:  Relevant Lab Results:   Additional Information [facility] MWF. SS #: [id number]  Lory Galan E Chaelyn Bunyan, LCSW

## 2023-04-10 ENCOUNTER — Telehealth: Payer: Self-pay

## 2023-04-10 DIAGNOSIS — R319 Hematuria, unspecified: Secondary | ICD-10-CM | POA: Diagnosis not present

## 2023-04-10 DIAGNOSIS — N390 Urinary tract infection, site not specified: Secondary | ICD-10-CM | POA: Diagnosis not present

## 2023-04-10 LAB — RENAL FUNCTION PANEL
Albumin: 2.6 g/dL — ABNORMAL LOW (ref 3.5–5.0)
Anion gap: 10 (ref 5–15)
BUN: 22 mg/dL (ref 8–23)
CO2: 27 mmol/L (ref 22–32)
Calcium: 9.1 mg/dL (ref 8.9–10.3)
Chloride: 99 mmol/L (ref 98–111)
Creatinine, Ser: 2.3 mg/dL — ABNORMAL HIGH (ref 0.44–1.00)
GFR, Estimated: 20 mL/min — ABNORMAL LOW (ref >60)
Glucose, Bld: 186 mg/dL — ABNORMAL HIGH (ref 70–99)
Phosphorus: 4.2 mg/dL (ref 2.5–4.6)
Potassium: 3.8 mmol/L (ref 3.5–5.1)
Sodium: 136 mmol/L (ref 135–145)

## 2023-04-10 LAB — URINE CULTURE: Culture: 3000 — AB

## 2023-04-10 LAB — CBC
HCT: 36 % (ref 36.0–46.0)
Hemoglobin: 11.3 g/dL — ABNORMAL LOW (ref 12.0–15.0)
MCH: 27.7 pg (ref 26.0–34.0)
MCHC: 31.4 g/dL (ref 30.0–36.0)
MCV: 88.2 fL (ref 80.0–100.0)
Platelets: 213 10*3/uL (ref 150–400)
RBC: 4.08 MIL/uL (ref 3.87–5.11)
RDW: 15.1 % (ref 11.5–15.5)
WBC: 9.4 10*3/uL (ref 4.0–10.5)
nRBC: 0 % (ref 0.0–0.2)

## 2023-04-10 MED ORDER — ANTICOAGULANT SODIUM CITRATE 4% (200MG/5ML) IV SOLN: 5.0000 mL | Status: DC | PRN

## 2023-04-10 MED ORDER — LIDOCAINE HCL (PF) 1 % IJ SOLN
5.0000 mL | INTRAMUSCULAR | Status: DC | PRN
  Filled 2023-04-10: qty 5

## 2023-04-10 MED ORDER — LIDOCAINE-PRILOCAINE 2.5-2.5 % EX CREA: 1.0000 | TOPICAL_CREAM | CUTANEOUS | Status: DC | PRN

## 2023-04-10 MED ORDER — ALTEPLASE 2 MG IJ SOLR: 2.0000 mg | Freq: Once | INTRAMUSCULAR | Status: DC | PRN

## 2023-04-10 MED ORDER — HEPARIN SODIUM (PORCINE) 1000 UNIT/ML DIALYSIS: 1000.0000 [IU] | INTRAMUSCULAR | Status: DC | PRN

## 2023-04-10 MED ORDER — SODIUM CHLORIDE 0.9 % IV SOLN
3.0000 g | Freq: Two times a day (BID) | INTRAVENOUS | Status: DC
  Filled 2023-04-10: qty 8

## 2023-04-10 MED ORDER — PENTAFLUOROPROP-TETRAFLUOROETH EX AERO: 1.0000 | INHALATION_SPRAY | CUTANEOUS | Status: DC | PRN

## 2023-04-10 MED ORDER — SODIUM CHLORIDE 0.9 % IV SOLN
2.0000 g | INTRAVENOUS | Status: DC
  Administered 2023-04-11: 2 g via INTRAVENOUS
  Filled 2023-04-10: qty 20

## 2023-04-10 NOTE — Progress Notes (Signed)
[facility]  ROUNDING NOTE   Subjective:   [name] was admitted to [facility] on 04/07/2023 for Hypokalemia [E87.6] Pericardial effusion [I31.39] Pneumonia [J18.9] Failure to thrive in adult [R62.7] Urinary tract infection without hematuria, site unspecified [N39.0] Community acquired pneumonia, unspecified laterality [J18.9]   Update:  Patient seen and evaluated during dialysis   HEMODIALYSIS FLOWSHEET:  Blood Flow Rate (mL/min): 399 mL/min Arterial Pressure (mmHg): -199.99 mmHg Venous Pressure (mmHg): 205.85 mmHg TMP (mmHg): -6.06 mmHg Ultrafiltration Rate (mL/min): 485 mL/min Dialysate Flow Rate (mL/min): 299 ml/min Dialysis Fluid Bolus: Normal Saline  No complaints to offer Resting comfortably during dialysis    Objective:  Vital signs in last 24 hours:  Temp:  [97.3 F (36.3 C)-97.9 F (36.6 C)] 97.3 F (36.3 C) (12/09 1000) Pulse Rate:  [71-93] 72 (12/09 1045) Resp:  [10-24] 17 (12/09 1045) BP: (135-163)/(61-73) 156/66 (12/09 1045) SpO2:  [87 %-100 %] 99 % (12/09 1045) Weight:  [42 kg] 42 kg (12/09 1000)  Weight change:  Filed Weights   04/08/23 1922 04/08/23 2314 04/10/23 1000  Weight: 44.6 kg 43.6 kg 42 kg    Intake/Output: I/O last 3 completed shifts: In: 480 [P.O.:480] Out: 940 [Other:940]   Intake/Output this shift:  No intake/output data recorded.  Physical Exam: General: NAD,   Head: +dysphonic voice   Eyes: Anicteric  Lungs:  Clear to auscultation, normal effort  Heart: Regular rate and rhythm  Abdomen:  Soft, nontender,   Extremities:  no peripheral edema.  Neurologic: Alert and oriented, moving all four extremities  Skin: No lesions  Access: AVF    Basic Metabolic Panel: Recent Labs  Lab 04/06/23 1159 04/06/23 1653 04/06/23 1654 04/08/23 0313 04/09/23 0313 04/10/23 0210  NA 141  --  139 143 137 136  K 3.5  --  2.6* 3.1* 3.3* 3.8  CL 97  --  100 108 100 99  CO2 28  --  30 27 29 27   GLUCOSE 88  --   107* 102* 131* 186*  BUN 20  --  22 29* 15 22  CREATININE 2.16*  --  2.36* 2.52* 1.60* 2.30*  CALCIUM 10.3  --  9.1 8.7* 8.8* 9.1  MG  --  1.8  --  2.1  --   --   PHOS  --  3.6  --  3.4  --  4.2    Liver Function Tests: Recent Labs  Lab 04/06/23 1159 04/08/23 0313 04/10/23 0210  AST 23 18  --   ALT 17 15  --   ALKPHOS 275* 194*  --   BILITOT 0.3 0.7  --   PROT 6.6 5.9*  --   ALBUMIN 3.8 2.9* 2.6*   Recent Labs  Lab 04/06/23 1159  LIPASE 24   No results for input(s): "AMMONIA" in the last 168 hours.  CBC: Recent Labs  Lab 04/06/23 1159 04/06/23 1654 04/08/23 0313 04/09/23 0313 04/10/23 0210  WBC 6.8 8.0 12.2* 9.5 9.4  NEUTROABS  --  6.7  --  8.3*  --   HGB 12.5 12.3 11.6* 11.6* 11.3*  HCT 40.8 38.9 37.0 36.0 36.0  MCV 90 89.4 87.9 86.3 88.2  PLT 238 213 212 197 213    Cardiac Enzymes: No results for input(s): "CKTOTAL", "CKMB", "CKMBINDEX", "TROPONINI" in the last 168 hours.  BNP: Invalid input(s): "POCBNP"  CBG: No results for input(s): "GLUCAP" in the last 168 hours.  Microbiology: Results for orders placed or performed during the hospital encounter of 04/07/23  Culture, blood (routine x 2)     Status: None (Preliminary result)   Collection Time: 04/07/23  3:04 AM   Specimen: BLOOD  Result Value Ref Range Status   Specimen Description BLOOD BLOOD RIGHT ARM  Final   Special Requests   Final    BOTTLES DRAWN AEROBIC AND ANAEROBIC Blood Culture adequate volume   Culture   Final    NO GROWTH 3 DAYS Performed at [facility], [address], [place], [place] [address]    Report Status PENDING  Incomplete  Culture, blood (routine x 2)     Status: Abnormal   Collection Time: 04/07/23  3:40 AM   Specimen: BLOOD  Result Value Ref Range Status   Specimen Description   Final    BLOOD BLOOD RIGHT FOREARM Performed at [facility], [address] [place], [place] [address]    Special Requests   Final    BOTTLES DRAWN AEROBIC AND  ANAEROBIC Blood Culture adequate volume Performed at [facility], [address], [place], [place] [address]    Culture  Setup Time   Final    GRAM POSITIVE COCCI ANAEROBIC BOTTLE ONLY CRITICAL RESULT CALLED TO, READ BACK BY AND VERIFIED WITH: EMILY STEINBOCK @ 2104 04/07/23 LFD  Performed at [facility], [address], [place], [place] [address]    Culture STREPTOCOCCUS ANGINOSIS (A)  Final   Report Status 04/09/2023 FINAL  Final   Organism ID, Bacteria STREPTOCOCCUS ANGINOSIS  Final      Susceptibility   Streptococcus anginosis - MIC*    PENICILLIN 0.5 INTERMEDIATE Intermediate     CEFTRIAXONE <=0.12 SENSITIVE Sensitive     ERYTHROMYCIN <=0.12 SENSITIVE Sensitive     VANCOMYCIN <=0.12 SENSITIVE Sensitive     * STREPTOCOCCUS ANGINOSIS  Blood Culture ID Panel (Reflexed)     Status: Abnormal   Collection Time: 04/07/23  3:40 AM  Result Value Ref Range Status   Enterococcus faecalis NOT DETECTED NOT DETECTED Final   Enterococcus Faecium NOT DETECTED NOT DETECTED Final   Listeria monocytogenes NOT DETECTED NOT DETECTED Final   Staphylococcus species NOT DETECTED NOT DETECTED Final   Staphylococcus aureus (BCID) NOT DETECTED NOT DETECTED Final   Staphylococcus epidermidis NOT DETECTED NOT DETECTED Final   Staphylococcus lugdunensis NOT DETECTED NOT DETECTED Final   Streptococcus species DETECTED (A) NOT DETECTED Final    Comment: Not Enterococcus species, Streptococcus agalactiae, Streptococcus pyogenes, or Streptococcus pneumoniae. EMILY STEINBOCK @ 2104 04/07/23 LFD    Streptococcus agalactiae NOT DETECTED NOT DETECTED Final   Streptococcus pneumoniae NOT DETECTED NOT DETECTED Final   Streptococcus pyogenes NOT DETECTED NOT DETECTED Final   A.calcoaceticus-baumannii NOT DETECTED NOT DETECTED Final   Bacteroides fragilis NOT DETECTED NOT DETECTED Final   Enterobacterales NOT DETECTED NOT DETECTED Final   Enterobacter cloacae complex NOT DETECTED NOT DETECTED  Final   Escherichia coli NOT DETECTED NOT DETECTED Final   Klebsiella aerogenes NOT DETECTED NOT DETECTED Final   Klebsiella oxytoca NOT DETECTED NOT DETECTED Final   Klebsiella pneumoniae NOT DETECTED NOT DETECTED Final   Proteus species NOT DETECTED NOT DETECTED Final   Salmonella species NOT DETECTED NOT DETECTED Final   Serratia marcescens NOT DETECTED NOT DETECTED Final   Haemophilus influenzae NOT DETECTED NOT DETECTED Final   Neisseria meningitidis NOT DETECTED NOT DETECTED Final   Pseudomonas aeruginosa NOT DETECTED NOT DETECTED Final   Stenotrophomonas maltophilia NOT DETECTED NOT DETECTED Final   Candida albicans NOT DETECTED NOT DETECTED Final  Candida auris NOT DETECTED NOT DETECTED Final   Candida glabrata NOT DETECTED NOT DETECTED Final   Candida krusei NOT DETECTED NOT DETECTED Final   Candida parapsilosis NOT DETECTED NOT DETECTED Final   Candida tropicalis NOT DETECTED NOT DETECTED Final   Cryptococcus neoformans/gattii NOT DETECTED NOT DETECTED Final    Comment: Performed at [facility], [address], [place], [place] [address]  Urine Culture     Status: Abnormal (Preliminary result)   Collection Time: 04/07/23  3:53 AM   Specimen: In/Out Cath Urine  Result Value Ref Range Status   Specimen Description   Final    IN/OUT CATH URINE Performed at [facility], [address] [place], [place] [address]    Special Requests   Final    NONE Performed at [facility], [address], [place], [place] [address]    Culture (A)  Final    3,000 COLONIES/mL ENTEROCOCCUS FAECALIS CULTURE REINCUBATED FOR BETTER GROWTH Performed at [facility], [address] [place], [place] [address]    Report Status PENDING  Incomplete   Organism ID, Bacteria ENTEROCOCCUS FAECALIS (A)  Final      Susceptibility   Enterococcus faecalis - MIC*    AMPICILLIN <=2 SENSITIVE Sensitive     VANCOMYCIN 1 SENSITIVE Sensitive     * 3,000 COLONIES/mL  ENTEROCOCCUS FAECALIS  Resp panel by RT-PCR (RSV, Flu A&B, Covid) Anterior Nasal Swab     Status: None   Collection Time: 04/07/23  3:53 AM   Specimen: Anterior Nasal Swab  Result Value Ref Range Status   SARS Coronavirus 2 by RT PCR NEGATIVE NEGATIVE Final    Comment: (NOTE) SARS-CoV-2 target nucleic acids are NOT DETECTED.  The SARS-CoV-2 RNA is generally detectable in upper respiratory specimens during the acute phase of infection. The lowest concentration of SARS-CoV-2 viral copies this assay can detect is 138 copies/mL. A negative result does not preclude SARS-Cov-2 infection and should not be used as the sole basis for treatment or other patient management decisions. A negative result may occur with  improper specimen collection/handling, submission of specimen other than nasopharyngeal swab, presence of viral mutation(s) within the areas targeted by this assay, and inadequate number of viral copies(<138 copies/mL). A negative result must be combined with clinical observations, patient history, and epidemiological information. The expected result is Negative.  Fact Sheet for Patients:  BloggerCourse.com  Fact Sheet for Healthcare Providers:  SeriousBroker.it  This test is no t yet approved or cleared by the [place] FDA and  has been authorized for detection and/or diagnosis of SARS-CoV-2 by FDA under an Emergency Use Authorization (EUA). This EUA will remain  in effect (meaning this test can be used) for the duration of the COVID-19 declaration under Section 564(b)(1) of the Act, 21 U.S.C.section 360bbb-3(b)(1), unless the authorization is terminated  or revoked sooner.       Influenza A by PCR NEGATIVE NEGATIVE Final   Influenza B by PCR NEGATIVE NEGATIVE Final    Comment: (NOTE) The Xpert Xpress SARS-CoV-2/FLU/RSV plus assay is intended as an aid in the diagnosis of influenza from Nasopharyngeal swab specimens  and should not be used as a sole basis for treatment. Nasal washings and aspirates are unacceptable for Xpert Xpress SARS-CoV-2/FLU/RSV testing.  Fact Sheet for Patients: BloggerCourse.com  Fact Sheet for Healthcare Providers: SeriousBroker.it  This test is not yet approved or cleared by the [place] FDA and has been authorized for detection and/or diagnosis of SARS-CoV-2 by  FDA under an Emergency Use Authorization (EUA). This EUA will remain in effect (meaning this test can be used) for the duration of the COVID-19 declaration under Section 564(b)(1) of the Act, 21 U.S.C. section 360bbb-3(b)(1), unless the authorization is terminated or revoked.     Resp Syncytial Virus by PCR NEGATIVE NEGATIVE Final    Comment: (NOTE) Fact Sheet for Patients: BloggerCourse.com  Fact Sheet for Healthcare Providers: SeriousBroker.it  This test is not yet approved or cleared by the [place] FDA and has been authorized for detection and/or diagnosis of SARS-CoV-2 by FDA under an Emergency Use Authorization (EUA). This EUA will remain in effect (meaning this test can be used) for the duration of the COVID-19 declaration under Section 564(b)(1) of the Act, 21 U.S.C. section 360bbb-3(b)(1), unless the authorization is terminated or revoked.  Performed at [facility], [address] [place], [place] [address]   Expectorated Sputum Assessment w Gram Stain, Rflx to Resp Cult     Status: None   Collection Time: 04/09/23 12:24 PM   Specimen: Sputum  Result Value Ref Range Status   Specimen Description SPUTUM  Final   Special Requests NONE  Final   Sputum evaluation   Final    Sputum specimen not acceptable for testing.  Please recollect.   C/JOAN WILLIS AT 1329 04/09/23.PMF Performed at [facility], [address] [place], [place] [address]    Report Status  04/09/2023 FINAL  Final    Coagulation Studies: No results for input(s): "LABPROT", "INR" in the last 72 hours.  Urinalysis: No results for input(s): "COLORURINE", "LABSPEC", "PHURINE", "GLUCOSEU", "HGBUR", "BILIRUBINUR", "KETONESUR", "PROTEINUR", "UROBILINOGEN", "NITRITE", "LEUKOCYTESUR" in the last 72 hours.  Invalid input(s): "APPERANCEUR"     Imaging: No results found.   Medications:    anticoagulant sodium citrate     cefTRIAXone (ROCEPHIN)  IV 2 g (04/10/23 0444)    brimonidine  1 drop Both Eyes BID   carvedilol  3.125 mg Oral BID   feeding supplement (NEPRO CARB STEADY)  237 mL Oral BID BM   heparin  5,000 Units Subcutaneous Q12H   multivitamin  1 tablet Oral Daily   pantoprazole  40 mg Oral Daily   [COMPLETED] predniSONE  20 mg Oral Q breakfast   sertraline  25 mg Oral Daily   torsemide  100 mg Oral Daily   acetaminophen, ALPRAZolam, alteplase, anticoagulant sodium citrate, heparin, lidocaine (PF), lidocaine-prilocaine, ondansetron **OR** ondansetron (ZOFRAN) IV, pentafluoroprop-tetrafluoroeth, traZODone  Assessment/ Plan:  [name] is a 89 y.o.  female wth end stage renal disease on hemodialysis, hypertension, congestive heart failure, depression, GERD who presents to [facility] with 04/07/2023 with Hypokalemia [E87.6] Pericardial effusion [I31.39] Pneumonia [J18.9] Failure to thrive in adult [R62.7] Urinary tract infection without hematuria, site unspecified [N39.0] Community acquired pneumonia, unspecified laterality [J18.9]  [facility] MWF Left AVF 43kg  End Stage Renal Disease: Patient receiving scheduled dialysis today, UF goal 0.5 L as tolerated.  Next treatment scheduled for Wednesday.  Hypertension with chronic kidney disease: Continue torsemide.  Blood pressure 156/66 during dialysis.  Anemia with chronic kidney disease: mircera as outpatient. Hemoglobin remains acceptable at 11.3  Secondary Hyperparathyroidism: currently holding  phosphate binders. Gets parsabiv outpatient.  Calcium and phosphorus within optimal range.   LOS: 3 Laretta Pyatt 12/9/202411:16 AM

## 2023-04-10 NOTE — Progress Notes (Signed)
Patient taken to dialysis. 

## 2023-04-10 NOTE — Consult Note (Signed)
PHARMACY CONSULT NOTE - ELECTROLYTES  Pharmacy Consult for Electrolyte Monitoring and Replacement   Recent Labs: Height: 5\' 2"  (157.5 cm) Weight: 43.6 kg (96 lb 1.9 oz) IBW/kg (Calculated) : 50.1 Estimated Creatinine Clearance: 11.4 mL/min (A) (by C-G formula based on SCr of 2.3 mg/dL (H)).  Potassium (mmol/L)  Date Value  04/10/2023 3.8  05/15/2013 4.0   Magnesium (mg/dL)  Date Value  25/36/6440 2.1   Calcium (mg/dL)  Date Value  34/74/2595 9.1   Calcium, Total (mg/dL)  Date Value  63/87/5643 8.0 (L)   Albumin (g/dL)  Date Value  32/95/1884 2.6 (L)  04/06/2023 3.8  05/11/2013 3.6   Phosphorus (mg/dL)  Date Value  16/60/6301 4.2   Sodium (mmol/L)  Date Value  04/10/2023 136  04/06/2023 141  05/15/2013 132 (L)   Assessment  Lori Stanley is a 89 y.o. female presenting with failure to thrive. Pharmacy has been consulted to monitor and replace electrolytes. ESRD on hemodialysis    Pertinent medications: torsemide 100 mg po daily  Goal of Therapy: Electrolytes WNL  Plan:  No supplementation warranted  Check BMP with AM labs  Thank you for allowing pharmacy to be a part of this patient's care.  Merryl Hacker, PharmD 04/10/2023 7:21 AM

## 2023-04-10 NOTE — Progress Notes (Signed)
PROGRESS NOTE Lori Stanley  FAO:[id number] DOB: Apr 24, 1932 DOA: 04/07/2023 PCP: [name], MD  Brief Narrative/Hospital Course: Lori Stanley was admitted to the hospital with the working diagnosis of failure to thrive.   89 yo female with the past medical history of heart failure, hypertension, ESRD, and gastritis who presented with difficulty swallowing for the last 2 to 3 weeks prior to hospitalization. At home positive weight loss. Recent outpatient evaluation, recommended ENT referral for salivary gland enlargement. On her initial physical examination in the ED her blood pressure was 156/64, HR 76, RR 18 and 02 saturation 96%, lungs with no wheezing or rhonchi, heart with S1 and S2 present and regular, abdomen with no distention, positive mild pain to palpation mid right side, no lower extremity edema.   Na 139, K 2,6 Cl 100, bicarbonate 30, glucose 107, bun 22 cr 2,36  Wbc 8,0 hgb 12.3 plt 213  Sars covid 19 negative  Urine analysis SG 1,013, protein 100, leukocytes large, moderate hgb, > 50 rbc, > 50 wbc.   CT abdomen with cholelithiasis, colonic diverticulosis, moderate to marked severity diffuse gastric wall thickening with may represent gastritis.   CT soft tissue neck with limited evaluation. Advance degenerative changes in cervical spine.   Chest radiograph hypoinflation, cardiomegaly with elevation of right hemidiaphragm.   EKG 78 bpm, left axis deviation, left anterior fascicular block, sinus rhythm with no significant ST segment or T wave changes.  12/07 diet advance to dysphagia 2 diet. HD .  12/08 patient continue very weak and deconditioned, placed on steroids for mild edema noted on laryngoscopy.  Plan for HD 12/09. TOC consulted for disposition and planning for skilled nursing facility disposition once bed available.      Subjective: Seen and examined this morning alert awake, in dialysis Labs reviewed creatinine 2.3 stable bicarb, BUN and  potassium and chronic anemia on CBC. No new complaints resting comfortably  Assessment and Plan: Principal Problem:   UTI (urinary tract infection) Active Problems:   ESRD (end stage renal disease) (HCC)   Dysphagia   Essential hypertension   Diabetes (HCC)   Chronic diastolic CHF (congestive heart failure) (HCC)   Anxiety    UTI Urine culture with 3.000 CF enterococcus, possible contamination.  Completing 3 days of ceftriaxone  Acute hypoxemic respiratory failure: Resolved, continue incentive spirometry, OOB.  Pneumonia ruled out  ESRD  Hypokalemia Metabolic bone disease: Labs stable getting HD today nephrology managing Recent Labs    04/06/23 1159 04/06/23 1654 04/08/23 0313 04/09/23 0313 04/10/23 0210  BUN 20 22 29* 15 22  CREATININE 2.16* 2.36* 2.52* 1.60* 2.30*  CO2 28 30 27 29 27   K 3.5 2.6* 3.1* 3.3* 3.8    Anemia of kidney disease: Hemoglobin is stable. Recent Labs    04/06/23 1159 04/06/23 1654 04/06/23 1654 04/08/23 0313 04/09/23 0313 04/10/23 0210  HGB 12.5 12.3  --  11.6* 11.6* 11.3*  MCV 90 89.4   < > 87.9 86.3 88.2   < > = values in this interval not displayed.    HTN: BP stable continue torsemide, Coreg  Dysphagia Patient with significant cervical spine disease.  Supportive care PPI. Per ENT recommendations will do a short course of prednisone for laryngeal edema.   Diabetes; Stable con ssi No results for input(s): "GLUCAP", "HGBA1C" in the last 168 hours.  Chronic diastolic CHF No signs of fluid overload continue ultrafiltration and dialysis  cont torsemide"   Anxiety disorder: Continue sertraline and trazodone.   Failure to thrive  Debility/deconditioning   Body mass index is 16.94 kg/m.  Consult RD and augment diet  DVT prophylaxis: heparin injection 5,000 Units Start: 04/07/23 1000 Code Status:   Code Status: Full Code Family Communication: plan of care discussed with patient at bedside. Patient status is: Remains  hospitalized because of SNF PENDIN Level of care: Med-Surg   Dispo: The patient is from: HOME            Anticipated disposition: snf Objective: Vitals last 24 hrs: Vitals:   04/10/23 1015 04/10/23 1017 04/10/23 1045 04/10/23 1115  BP:  (!) 163/69 (!) 156/66 (!) 172/72  Pulse: 91 85 72 69  Resp: (!) 23 (!) 24 17 14   Temp:      TempSrc:      SpO2: 97% 100% 99% 99%  Weight:      Height:       Weight change:   Physical Examination: General exam: alert awake, older than stated age HEENT:Oral mucosa moist, Ear/Nose WNL grossly Respiratory system: bilaterally CLEAR BS, no use of accessory muscle Cardiovascular system: S1 & S2 +, No JVD. Gastrointestinal system: Abdomen soft,NT,ND, BS+ Nervous System:Alert, awake, moving extremities. Extremities: LE edema NEG,distal peripheral pulses palpable.  Skin: No rashes,no icterus. MSK: Normal muscle bulk,tone, power  Medications reviewed:  Scheduled Meds:  brimonidine  1 drop Both Eyes BID   carvedilol  3.125 mg Oral BID   feeding supplement (NEPRO CARB STEADY)  237 mL Oral BID BM   heparin  5,000 Units Subcutaneous Q12H   multivitamin  1 tablet Oral Daily   pantoprazole  40 mg Oral Daily   sertraline  25 mg Oral Daily   torsemide  100 mg Oral Daily   Continuous Infusions:  anticoagulant sodium citrate     cefTRIAXone (ROCEPHIN)  IV 2 g (04/10/23 0444)    Diet Order             DIET DYS 2 Fluid consistency: Thin  Diet effective now                   Intake/Output Summary (Last 24 hours) at 04/10/2023 1218 Last data filed at 04/09/2023 1400 Gross per 24 hour  Intake 360 ml  Output --  Net 360 ml   Net IO Since Admission: -110 mL [04/10/23 1218]  Wt Readings from Last 3 Encounters:  04/10/23 42 kg  04/06/23 36.5 kg  02/02/23 52.6 kg     Unresulted Labs (From admission, onward)     Start     Ordered   04/11/23 0500  Basic metabolic panel  Tomorrow morning,   R       Question:  Specimen collection method  Answer:   Lab=Lab collect   04/10/23 0723   04/10/23 0845  Hepatitis B surface antibody,quantitative  Once,   R       Question:  Specimen collection method  Answer:  Lab=Lab collect   04/10/23 0844   04/08/23 1739  Hepatitis B surface antibody,quantitative  (New Admission Hemo Labs (Hepatitis B))  Once,   R        04/08/23 1739   04/07/23 7829  Legionella Pneumophila Serogp 1 Ur Ag  (COPD / Pneumonia / Cellulitis / Lower Extremity Wound)  Once,   R        04/07/23 5621   Signed and Held  Renal function panel  Once,   R        Signed and Held   Signed and Held  CBC  Once,  R        Signed and Held   Signed and Held  Comprehensive metabolic panel  Once,   R       Question:  Specimen collection method  Answer:  Lab=Lab collect   Signed and Held          Data Reviewed: I have personally reviewed following labs and imaging studies CBC: Recent Labs  Lab 04/06/23 1159 04/06/23 1654 04/08/23 0313 04/09/23 0313 04/10/23 0210  WBC 6.8 8.0 12.2* 9.5 9.4  NEUTROABS  --  6.7  --  8.3*  --   HGB 12.5 12.3 11.6* 11.6* 11.3*  HCT 40.8 38.9 37.0 36.0 36.0  MCV 90 89.4 87.9 86.3 88.2  PLT 238 213 212 197 213   Basic Metabolic Panel:  Recent Labs  Lab 04/06/23 1159 04/06/23 1653 04/06/23 1654 04/08/23 0313 04/09/23 0313 04/10/23 0210  NA 141  --  139 143 137 136  K 3.5  --  2.6* 3.1* 3.3* 3.8  CL 97  --  100 108 100 99  CO2 28  --  30 27 29 27   GLUCOSE 88  --  107* 102* 131* 186*  BUN 20  --  22 29* 15 22  CREATININE 2.16*  --  2.36* 2.52* 1.60* 2.30*  CALCIUM 10.3  --  9.1 8.7* 8.8* 9.1  MG  --  1.8  --  2.1  --   --   PHOS  --  3.6  --  3.4  --  4.2   GFR: Estimated Creatinine Clearance: 11 mL/min (A) (by C-G formula based on SCr of 2.3 mg/dL (H)). Liver Function Tests:  Recent Labs  Lab 04/06/23 1159 04/08/23 0313 04/10/23 0210  AST 23 18  --   ALT 17 15  --   ALKPHOS 275* 194*  --   BILITOT 0.3 0.7  --   PROT 6.6 5.9*  --   ALBUMIN 3.8 2.9* 2.6*   Recent Labs  Lab  04/06/23 1159  LIPASE 24  Sepsis Labs: Recent Labs  Lab 04/07/23 0304 04/07/23 0652  LATICACIDVEN 1.2 1.8   Recent Results (from the past 240 hour(s))  Urine Culture     Status: Abnormal   Collection Time: 04/06/23 12:00 AM   Specimen: Urine   Urine  Result Value Ref Range Status   Urine Culture, Routine Final report (A)  Final   Organism ID, Bacteria Comment (A)  Final    Comment: Streptococcus anginosus group Greater than 100,000 colony forming units per mL Susceptibility not normally performed on this organism.   Culture, blood (routine x 2)     Status: None (Preliminary result)   Collection Time: 04/07/23  3:04 AM   Specimen: BLOOD  Result Value Ref Range Status   Specimen Description BLOOD BLOOD RIGHT ARM  Final   Special Requests   Final    BOTTLES DRAWN AEROBIC AND ANAEROBIC Blood Culture adequate volume   Culture   Final    NO GROWTH 3 DAYS Performed at [facility], [address], [place], [place] [address]    Report Status PENDING  Incomplete  Culture, blood (routine x 2)     Status: Abnormal   Collection Time: 04/07/23  3:40 AM   Specimen: BLOOD  Result Value Ref Range Status   Specimen Description   Final    BLOOD BLOOD RIGHT FOREARM Performed at [facility], [address], [place], [place] [address]    Special Requests   Final  BOTTLES DRAWN AEROBIC AND ANAEROBIC Blood Culture adequate volume Performed at [facility], [address] [place], [place] [address]    Culture  Setup Time   Final    GRAM POSITIVE COCCI ANAEROBIC BOTTLE ONLY CRITICAL RESULT CALLED TO, READ BACK BY AND VERIFIED WITH: EMILY STEINBOCK @ 2104 04/07/23 LFD  Performed at [facility], [address] [place], [place] [address]    Culture STREPTOCOCCUS ANGINOSIS (A)  Final   Report Status 04/09/2023 FINAL  Final   Organism ID, Bacteria STREPTOCOCCUS ANGINOSIS  Final      Susceptibility   Streptococcus anginosis - MIC*    PENICILLIN 0.5  INTERMEDIATE Intermediate     CEFTRIAXONE <=0.12 SENSITIVE Sensitive     ERYTHROMYCIN <=0.12 SENSITIVE Sensitive     VANCOMYCIN <=0.12 SENSITIVE Sensitive     * STREPTOCOCCUS ANGINOSIS  Blood Culture ID Panel (Reflexed)     Status: Abnormal   Collection Time: 04/07/23  3:40 AM  Result Value Ref Range Status   Enterococcus faecalis NOT DETECTED NOT DETECTED Final   Enterococcus Faecium NOT DETECTED NOT DETECTED Final   Listeria monocytogenes NOT DETECTED NOT DETECTED Final   Staphylococcus species NOT DETECTED NOT DETECTED Final   Staphylococcus aureus (BCID) NOT DETECTED NOT DETECTED Final   Staphylococcus epidermidis NOT DETECTED NOT DETECTED Final   Staphylococcus lugdunensis NOT DETECTED NOT DETECTED Final   Streptococcus species DETECTED (A) NOT DETECTED Final    Comment: Not Enterococcus species, Streptococcus agalactiae, Streptococcus pyogenes, or Streptococcus pneumoniae. EMILY STEINBOCK @ 2104 04/07/23 LFD    Streptococcus agalactiae NOT DETECTED NOT DETECTED Final   Streptococcus pneumoniae NOT DETECTED NOT DETECTED Final   Streptococcus pyogenes NOT DETECTED NOT DETECTED Final   A.calcoaceticus-baumannii NOT DETECTED NOT DETECTED Final   Bacteroides fragilis NOT DETECTED NOT DETECTED Final   Enterobacterales NOT DETECTED NOT DETECTED Final   Enterobacter cloacae complex NOT DETECTED NOT DETECTED Final   Escherichia coli NOT DETECTED NOT DETECTED Final   Klebsiella aerogenes NOT DETECTED NOT DETECTED Final   Klebsiella oxytoca NOT DETECTED NOT DETECTED Final   Klebsiella pneumoniae NOT DETECTED NOT DETECTED Final   Proteus species NOT DETECTED NOT DETECTED Final   Salmonella species NOT DETECTED NOT DETECTED Final   Serratia marcescens NOT DETECTED NOT DETECTED Final   Haemophilus influenzae NOT DETECTED NOT DETECTED Final   Neisseria meningitidis NOT DETECTED NOT DETECTED Final   Pseudomonas aeruginosa NOT DETECTED NOT DETECTED Final   Stenotrophomonas maltophilia NOT  DETECTED NOT DETECTED Final   Candida albicans NOT DETECTED NOT DETECTED Final   Candida auris NOT DETECTED NOT DETECTED Final   Candida glabrata NOT DETECTED NOT DETECTED Final   Candida krusei NOT DETECTED NOT DETECTED Final   Candida parapsilosis NOT DETECTED NOT DETECTED Final   Candida tropicalis NOT DETECTED NOT DETECTED Final   Cryptococcus neoformans/gattii NOT DETECTED NOT DETECTED Final    Comment: Performed at [facility], [address] [place], [place] [address]  Urine Culture     Status: Abnormal   Collection Time: 04/07/23  3:53 AM   Specimen: In/Out Cath Urine  Result Value Ref Range Status   Specimen Description   Final    IN/OUT CATH URINE Performed at [facility], [address], [place], [place] [address]    Special Requests   Final    NONE Performed at [facility], [address], [place], [place] [address]    Culture (A)  Final    3,000 COLONIES/mL ENTEROCOCCUS FAECALIS >=  100,000 COLONIES/mL VIRIDANS STREPTOCOCCUS    Report Status 04/10/2023 FINAL  Final   Organism ID, Bacteria ENTEROCOCCUS FAECALIS (A)  Final      Susceptibility   Enterococcus faecalis - MIC*    AMPICILLIN <=2 SENSITIVE Sensitive     VANCOMYCIN 1 SENSITIVE Sensitive     NITROFURANTOIN <=16 SENSITIVE Sensitive     * 3,000 COLONIES/mL ENTEROCOCCUS FAECALIS  Resp panel by RT-PCR (RSV, Flu A&B, Covid) Anterior Nasal Swab     Status: None   Collection Time: 04/07/23  3:53 AM   Specimen: Anterior Nasal Swab  Result Value Ref Range Status   SARS Coronavirus 2 by RT PCR NEGATIVE NEGATIVE Final    Comment: (NOTE) SARS-CoV-2 target nucleic acids are NOT DETECTED.  The SARS-CoV-2 RNA is generally detectable in upper respiratory specimens during the acute phase of infection. The lowest concentration of SARS-CoV-2 viral copies this assay can detect is 138 copies/mL. A negative result does not preclude SARS-Cov-2 infection and should not be used as the sole basis  for treatment or other patient management decisions. A negative result may occur with  improper specimen collection/handling, submission of specimen other than nasopharyngeal swab, presence of viral mutation(s) within the areas targeted by this assay, and inadequate number of viral copies(<138 copies/mL). A negative result must be combined with clinical observations, patient history, and epidemiological information. The expected result is Negative.  Fact Sheet for Patients:  BloggerCourse.com  Fact Sheet for Healthcare Providers:  SeriousBroker.it  This test is no t yet approved or cleared by the [place] FDA and  has been authorized for detection and/or diagnosis of SARS-CoV-2 by FDA under an Emergency Use Authorization (EUA). This EUA will remain  in effect (meaning this test can be used) for the duration of the COVID-19 declaration under Section 564(b)(1) of the Act, 21 U.S.C.section 360bbb-3(b)(1), unless the authorization is terminated  or revoked sooner.       Influenza A by PCR NEGATIVE NEGATIVE Final   Influenza B by PCR NEGATIVE NEGATIVE Final    Comment: (NOTE) The Xpert Xpress SARS-CoV-2/FLU/RSV plus assay is intended as an aid in the diagnosis of influenza from Nasopharyngeal swab specimens and should not be used as a sole basis for treatment. Nasal washings and aspirates are unacceptable for Xpert Xpress SARS-CoV-2/FLU/RSV testing.  Fact Sheet for Patients: BloggerCourse.com  Fact Sheet for Healthcare Providers: SeriousBroker.it  This test is not yet approved or cleared by the [place] FDA and has been authorized for detection and/or diagnosis of SARS-CoV-2 by FDA under an Emergency Use Authorization (EUA). This EUA will remain in effect (meaning this test can be used) for the duration of the COVID-19 declaration under Section 564(b)(1) of the Act, 21  U.S.C. section 360bbb-3(b)(1), unless the authorization is terminated or revoked.     Resp Syncytial Virus by PCR NEGATIVE NEGATIVE Final    Comment: (NOTE) Fact Sheet for Patients: BloggerCourse.com  Fact Sheet for Healthcare Providers: SeriousBroker.it  This test is not yet approved or cleared by the [place] FDA and has been authorized for detection and/or diagnosis of SARS-CoV-2 by FDA under an Emergency Use Authorization (EUA). This EUA will remain in effect (meaning this test can be used) for the duration of the COVID-19 declaration under Section 564(b)(1) of the Act, 21 U.S.C. section 360bbb-3(b)(1), unless the authorization is terminated or revoked.  Performed at [facility], [address] [place], [place] [address]   Expectorated Sputum Assessment w Gram Stain, Rflx to Resp Cult  Status: None   Collection Time: 04/09/23 12:24 PM   Specimen: Sputum  Result Value Ref Range Status   Specimen Description SPUTUM  Final   Special Requests NONE  Final   Sputum evaluation   Final    Sputum specimen not acceptable for testing.  Please recollect.   C/JOAN WILLIS AT 1329 04/09/23.PMF Performed at [facility], [address] [place], [place] [address]    Report Status 04/09/2023 FINAL  Final    Antimicrobials/Microbiology: Anti-infectives (From admission, onward)    Start     Dose/Rate Route Frequency Ordered Stop   04/08/23 0500  cefTRIAXone (ROCEPHIN) 1 g in sodium chloride 0.9 % 100 mL IVPB  Status:  Discontinued        1 g 200 mL/hr over 30 Minutes Intravenous Every 24 hours 04/07/23 0821 04/07/23 1252   04/08/23 0500  cefTRIAXone (ROCEPHIN) 2 g in sodium chloride 0.9 % 100 mL IVPB        2 g 200 mL/hr over 30 Minutes Intravenous Every 24 hours 04/07/23 1252 04/12/23 0459   04/07/23 0830  azithromycin (ZITHROMAX) 500 mg in sodium chloride 0.9 % 250 mL IVPB  Status:  Discontinued         500 mg 250 mL/hr over 60 Minutes Intravenous Every 24 hours 04/07/23 0821 04/08/23 1655   04/07/23 0430  cefTRIAXone (ROCEPHIN) 1 g in sodium chloride 0.9 % 100 mL IVPB        1 g 200 mL/hr over 30 Minutes Intravenous  Once 04/07/23 0426 04/07/23 0509         Component Value Date/Time   SDES SPUTUM 04/09/2023 1224   SPECREQUEST NONE 04/09/2023 1224   CULT (A) 04/07/2023 0353    3,000 COLONIES/mL ENTEROCOCCUS FAECALIS >=100,000 COLONIES/mL VIRIDANS STREPTOCOCCUS    REPTSTATUS 04/09/2023 FINAL 04/09/2023 1224     Radiology Studies: No results found.  LOS: 3 days  Total time spent in review of labs and imaging, patient evaluation, formulation of plan, documentation and communication with family: 25 minutes  Lanae Boast, MD [facility]  04/10/2023, 12:18 PM

## 2023-04-10 NOTE — Progress Notes (Addendum)
Morning medications will be given late, patient taken to dialysis.Edit to note 0831- patient refused to go with transport to dialysis as she wanted to eat breakfast first. Medications will be given with the exception of b/p meds.

## 2023-04-10 NOTE — Progress Notes (Signed)
Received patient in bed to unit.    Informed consent signed and in chart.    TX duration: 2.5 hrs     Transported back to floor  Hand-off given to patient's nurse.   Access used:  lt avf Access issues: n/a  Total UF removed: 500 mls      Maple Hudson, RN Dialysis Unit

## 2023-04-10 NOTE — Plan of Care (Signed)

## 2023-04-10 NOTE — Progress Notes (Signed)
PT Cancellation Note  Patient Details Name: Lori Stanley MRN: [id number] DOB: 1931-08-05   Cancelled Treatment:    Reason Eval/Treat Not Completed: Other (comment): At time of attempt, patient being transported to Dialysis. Will re-attempt at later date/time.   Creed Copper Fairly, PT, DPT 04/10/23 9:58 AM

## 2023-04-11 DIAGNOSIS — B954 Other streptococcus as the cause of diseases classified elsewhere: Secondary | ICD-10-CM

## 2023-04-11 DIAGNOSIS — R319 Hematuria, unspecified: Secondary | ICD-10-CM | POA: Diagnosis not present

## 2023-04-11 DIAGNOSIS — N390 Urinary tract infection, site not specified: Secondary | ICD-10-CM | POA: Diagnosis not present

## 2023-04-11 DIAGNOSIS — R7881 Bacteremia: Secondary | ICD-10-CM | POA: Diagnosis not present

## 2023-04-11 LAB — BASIC METABOLIC PANEL
Anion gap: 9 (ref 5–15)
BUN: 21 mg/dL (ref 8–23)
CO2: 29 mmol/L (ref 22–32)
Calcium: 9.1 mg/dL (ref 8.9–10.3)
Chloride: 99 mmol/L (ref 98–111)
Creatinine, Ser: 2.09 mg/dL — ABNORMAL HIGH (ref 0.44–1.00)
GFR, Estimated: 22 mL/min — ABNORMAL LOW (ref >60)
Glucose, Bld: 77 mg/dL (ref 70–99)
Potassium: 3.4 mmol/L — ABNORMAL LOW (ref 3.5–5.1)
Sodium: 137 mmol/L (ref 135–145)

## 2023-04-11 LAB — HEPATITIS B SURFACE ANTIBODY, QUANTITATIVE
Hep B S AB Quant (Post): <3.5 m[IU]/mL — ABNORMAL LOW
Hep B S AB Quant (Post): <3.5 m[IU]/mL — ABNORMAL LOW

## 2023-04-11 LAB — LEGIONELLA PNEUMOPHILA SEROGP 1 UR AG: L. pneumophila Serogp 1 Ur Ag: NEGATIVE

## 2023-04-11 MED ORDER — PROSOURCE PLUS PO LIQD
30.0000 mL | Freq: Two times a day (BID) | ORAL | Status: DC
  Administered 2023-04-11 – 2023-04-18 (×12): 30 mL via ORAL
  Filled 2023-04-11 (×14): qty 30

## 2023-04-11 MED ORDER — POTASSIUM CHLORIDE CRYS ER 20 MEQ PO TBCR
40.0000 meq | EXTENDED_RELEASE_TABLET | Freq: Once | ORAL | Status: AC
  Administered 2023-04-11: 40 meq via ORAL
  Filled 2023-04-11: qty 2

## 2023-04-11 MED ORDER — NYSTATIN 100000 UNIT/ML MT SUSP
5.0000 mL | Freq: Four times a day (QID) | OROMUCOSAL | Status: DC
  Administered 2023-04-11 – 2023-04-18 (×21): 500000 [IU] via ORAL
  Filled 2023-04-11 (×28): qty 5

## 2023-04-11 MED ORDER — CEFAZOLIN SODIUM-DEXTROSE 2-4 GM/100ML-% IV SOLN
2.0000 g | INTRAVENOUS | Status: DC
  Administered 2023-04-12 – 2023-04-17 (×3): 2 g via INTRAVENOUS
  Filled 2023-04-11 (×3): qty 100

## 2023-04-11 MED ORDER — ENSURE ENLIVE PO LIQD
237.0000 mL | Freq: Three times a day (TID) | ORAL | Status: DC
  Administered 2023-04-11 – 2023-04-14 (×7): 237 mL via ORAL

## 2023-04-11 NOTE — Plan of Care (Signed)
  Problem: Clinical Measurements: Goal: Ability to maintain clinical measurements within normal limits will improve Outcome: Progressing Goal: Will remain free from infection Outcome: Progressing Goal: Diagnostic test results will improve Outcome: Progressing Goal: Respiratory complications will improve Outcome: Progressing Goal: Cardiovascular complication will be avoided Outcome: Progressing   Problem: Elimination: Goal: Will not experience complications related to bowel motility Outcome: Progressing Goal: Will not experience complications related to urinary retention Outcome: Progressing   Problem: Safety: Goal: Ability to remain free from injury will improve Outcome: Progressing

## 2023-04-11 NOTE — Progress Notes (Signed)
Initial Nutrition Assessment  DOCUMENTATION CODES:   Underweight, Severe malnutrition in context of chronic illness  INTERVENTION:   -D/c Nepro -Renal MVI daily -Ensure Enlive po TID, each supplement provides 350 kcal and 20 grams of protein -Magic cup TID with meals, each supplement provides 290 kcal and 9 grams of protein  -30 ml Prosource Plus BID, each supplement provides 100 kcals and 15 grams protein  NUTRITION DIAGNOSIS:   Severe Malnutrition related to chronic illness (ESRD on HD) as evidenced by moderate fat depletion, severe fat depletion, moderate muscle depletion, severe muscle depletion.  GOAL:   Patient will meet greater than or equal to 90% of their needs  MONITOR:   PO intake, Supplement acceptance, Diet advancement  REASON FOR ASSESSMENT:   Consult Assessment of nutrition requirement/status  ASSESSMENT:   Pt with medical history significant of multiple medical issues including HFpEF, ESRD on hemodialysis Monday Wednesday Friday, GERD, hypertension presenting with failure to thrive, pneumonia, gastritis.  Patient reports difficulty swallowing over the past 2 to 3 weeks PTA.  Pt admitted with UTI, dysphagia, and FTT.   Reviewed I/O's: -110 ml x 24 hours   Spoke with pt, who was sitting in recliner chair at time of visit. Pt just finished working with PT and reports having a good session.   Noted breakfast tray at bedside; pt consumed 75% of oatmeal and applesauce. Pt shares that she has had dysphagia over the past several weeks, mainly painful swallowing, which has improved since admission. Pt reports she has pain mostly from harder foods and has been consuming pureed foods (like applesauce, masked potatoes, and pudding) without difficulty. Pt is taking liquids, but states that she is does this slowly. Per pt, she has a hard time with the harder textured foods, like chopped sausage. Discussed option of further downgrading diet further, but pt politely declined  as she likes to be able to eat biscuits from the menu (further downgrading diet would limit this).   Pt reports fair intake PTA. She usually consumes 2 meals and a snack (Breakfast: scrambled eggs and toast; Dinner: spaghetti and mac and cheese with green beans; Snacks: peanut butter crackers).   Pt endorses wt loss, but unsure how much. She reports the most she ever weighed was 105# in 1957 before getting married. Reviewed wt hx; pt has experienced a 15.8% wt loss over the past 5 months, which is significant for time frame. EDW 45 kg. Pt thinks her dry weight has also been decreasing in HD, but otherwise tolerating HD treatments well.   Discussed importance of good meal and supplement intake to promote healing.   Medications reviewed and include protonix, rocephin, and demadex.   Lab Results  Component Value Date   HGBA1C 5.0 09/21/2021   PTA DM medications are none.   Labs reviewed: K: 3.4, CBGS: 120 (inpatient orders for glycemic control are none).    NUTRITION - FOCUSED PHYSICAL EXAM:  Flowsheet Row Most Recent Value  Orbital Region Moderate depletion  Upper Arm Region Severe depletion  Thoracic and Lumbar Region Severe depletion  Buccal Region Moderate depletion  Temple Region Moderate depletion  Clavicle Bone Region Severe depletion  Clavicle and Acromion Bone Region Severe depletion  Scapular Bone Region Severe depletion  Dorsal Hand Severe depletion  Patellar Region Moderate depletion  Anterior Thigh Region Moderate depletion  Posterior Calf Region Moderate depletion  Edema (RD Assessment) None  Hair Reviewed  Eyes Reviewed  Mouth Reviewed  Skin Reviewed  Nails Reviewed  Diet Order:   Diet Order             DIET DYS 2 Fluid consistency: Thin  Diet effective now                   EDUCATION NEEDS:   Education needs have been addressed  Skin:  Skin Assessment: Reviewed RN Assessment  Last BM:  04/11/23 (type 6)  Height:   Ht Readings from  Last 1 Encounters:  04/08/23 5\' 2"  (1.575 m)    Weight:   Wt Readings from Last 1 Encounters:  04/10/23 42 kg    Ideal Body Weight:  50 kg  BMI:  Body mass index is 16.94 kg/m.  Estimated Nutritional Needs:   Kcal:  1500-1700  Protein:  70-85 grams  Fluid:  1000 ml + UOP    Levada Schilling, RD, LDN, CDCES Registered Dietitian III Certified Diabetes Care and Education Specialist Please refer to [facility] for RD and/or RD on-call/weekend/after hours pager

## 2023-04-11 NOTE — Discharge Planning (Signed)
ESTABLISHED HEMODIALYSIS Outpatient Facility [facility]  [address] [place], [place] [address] [phone]  Schedule: MWF 10:30am  Dimas Chyle Dialysis Coordinator II  [facility] Cell: [phone] eFax: [phone] [email or web address]

## 2023-04-11 NOTE — Consult Note (Signed)
PHARMACY CONSULT NOTE - ELECTROLYTES  Pharmacy Consult for Electrolyte Monitoring and Replacement   Recent Labs: Height: 5\' 2"  (157.5 cm) Weight: 42 kg (92 lb 9.5 oz) IBW/kg (Calculated) : 50.1 Estimated Creatinine Clearance: 12.1 mL/min (A) (by C-G formula based on SCr of 2.09 mg/dL (H)).  Potassium (mmol/L)  Date Value  04/11/2023 3.4 (L)  05/15/2013 4.0   Magnesium (mg/dL)  Date Value  40/98/1191 2.1   Calcium (mg/dL)  Date Value  47/82/9562 9.1   Calcium, Total (mg/dL)  Date Value  13/11/6576 8.0 (L)   Albumin (g/dL)  Date Value  46/96/2952 2.6 (L)  04/06/2023 3.8  05/11/2013 3.6   Phosphorus (mg/dL)  Date Value  84/13/2440 4.2   Sodium (mmol/L)  Date Value  04/11/2023 137  04/06/2023 141  05/15/2013 132 (L)   Assessment  Lori Stanley is a 89 y.o. female presenting with failure to thrive. Pharmacy has been consulted to monitor and replace electrolytes. ESRD on hemodialysis    Pertinent medications: torsemide 100 mg po daily  Goal of Therapy: Electrolytes WNL  Plan:  K 3.4: Kcl x 1 Check BMP, Mag, Phos with AM labs  Thank you for allowing pharmacy to be a part of this patient's care.  Bettey Costa, PharmD 04/11/2023 8:23 AM

## 2023-04-11 NOTE — Progress Notes (Signed)
Occupational Therapy Treatment Patient Details Name: Lori Stanley MRN: [id number] DOB: May 04, 1931 Today's Date: 04/11/2023   History of present illness 89 y.o. female with PMHx of HFpEF, ESRD on hemodialysis Monday Wednesday Friday, GERD, hypertension presenting with failure to thrive, pneumonia, gastritis, Cholelithiasis and Colonic diverticulosis and UTI.   OT comments  [name] was seen for OT treatment on this date. Upon arrival to room pt in bed, agreeable to tx. Upon initiating bed mobility pt noted to be incontinent of stool. Pt requires MIN A + HHA for BSC t/f and pericare sitting. MAX A for LB bathing in sitting 2/2 stool incontinence. Left in chair with alarm on and all needs in reach. Pt making good progress toward goals, will continue to follow POC. Discharge recommendation updated to reflect pt needs and no assistance at home.       If plan is discharge home, recommend the following:  A little help with walking and/or transfers;A little help with bathing/dressing/bathroom;Help with stairs or ramp for entrance;Assist for transportation;Assistance with cooking/housework   Equipment Recommendations  BSC/3in1    Recommendations for Other Services      Precautions / Restrictions Precautions Precautions: Fall Restrictions Weight Bearing Restrictions: No       Mobility Bed Mobility Overal bed mobility: Needs Assistance Bed Mobility: Supine to Sit     Supine to sit: Min assist          Transfers Overall transfer level: Needs assistance Equipment used: 1 person hand held assist Transfers: Sit to/from Stand, Bed to chair/wheelchair/BSC Sit to Stand: Min assist     Step pivot transfers: Min assist           Balance Overall balance assessment: Needs assistance Sitting-balance support: Feet supported, Single extremity supported Sitting balance-Leahy Scale: Fair     Standing balance support: Bilateral upper extremity supported Standing  balance-Leahy Scale: Poor                             ADL either performed or assessed with clinical judgement   ADL Overall ADL's : Needs assistance/impaired                                       General ADL Comments: MIN A + HHA for BSC t/f and pericare sitting. MAX A LB bathing in sitting 2/2 stool incontinence.      Cognition Arousal: Alert Behavior During Therapy: WFL for tasks assessed/performed Overall Cognitive Status: History of cognitive impairments - at baseline                                 General Comments: oriented to self and location, incontinent of stool                   Pertinent Vitals/ Pain       Pain Assessment Pain Assessment: No/denies pain   Frequency  Min 1X/week        Progress Toward Goals  OT Goals(current goals can now be found in the care plan section)  Progress towards OT goals: Progressing toward goals  Acute Rehab OT Goals OT Goal Formulation: With patient Time For Goal Achievement: 04/21/23 Potential to Achieve Goals: Good ADL Goals Pt Will Perform Lower Body Bathing: with supervision;sitting/lateral leans;sit to/from stand Pt Will Perform Lower Body  Dressing: with supervision;sitting/lateral leans;sit to/from stand Pt Will Transfer to Toilet: with supervision;ambulating;bedside commode;grab bars Pt Will Perform Toileting - Clothing Manipulation and hygiene: with supervision;sit to/from stand;sitting/lateral leans Pt/caregiver will Perform Home Exercise Program: Both right and left upper extremity;With theraputty;With Supervision;With written HEP provided  Plan      Co-evaluation                 AM-PAC OT "6 Clicks" Daily Activity     Outcome Measure   Help from another person eating meals?: None Help from another person taking care of personal grooming?: A Little Help from another person toileting, which includes using toliet, bedpan, or urinal?: A Little Help from  another person bathing (including washing, rinsing, drying)?: A Lot Help from another person to put on and taking off regular upper body clothing?: A Little Help from another person to put on and taking off regular lower body clothing?: A Lot 6 Click Score: 17    End of Session    OT Visit Diagnosis: Other abnormalities of gait and mobility (R26.89)   Activity Tolerance Patient tolerated treatment well   Patient Left in chair;with call bell/phone within reach;with chair alarm set   Nurse Communication Mobility status        Time: 5784-6962 OT Time Calculation (min): 18 min  Charges: OT General Charges $OT Visit: 1 Visit OT Treatments $Self Care/Home Management : 8-22 mins  Kathie Dike, M.S. OTR/L  04/11/23, 10:53 AM  ascom [phone]

## 2023-04-11 NOTE — Plan of Care (Signed)

## 2023-04-11 NOTE — Consult Note (Addendum)
NAME: Lori Stanley  DOB: 02-07-1932  MRN: [id number]  Date/Time: 04/11/2023 3:47 PM  REQUESTING PROVIDER: Dr.Ramesh Subjective:  REASON FOR CONSULT: streptococcus anginosus bacteremia ? Lori Stanley is a 89 y.o. female with a history of ESRD on HD Anemia, HFePF, HTN, HLD, CAD presented with rt sided flank pain and pain in her throat. PT had seen her PCP on 12/5 with above complaints of 1 week duration.Pain in her throat for a few days, pain while swallowing No fever or chills- cough + In the ED vitals  04/07/23 03:52  BP 157/55 !  Temp 98 F (36.7 C)  Pulse Rate 64  Resp 12  SpO2 100 %  O2 Flow Rate (L/min) 2 L/min    Latest Reference Range & Units 04/08/23  WBC 4.0 - 10.5 K/uL 12.2 (H)  Hemoglobin 12.0 - 15.0 g/dL 40.9 (L)  HCT 81.1 - 91.4 % 37.0  Platelets 150 - 400 K/uL 212  Creatinine 0.44 - 1.00 mg/dL 7.82 (H)  Hepatitis B Surface Ag NON REACTIVE  NON REACTIVE [1]  Blood culture sent UA from PCP office was strep anginosus Blood culture was strep anginosus and I am seeing the patient for the same Pt lives on her own Goes to HD thre etimes a week Makes some urine daily 'no blood in [place]  Past Medical History:  Diagnosis Date   Acute heart failure (HCC)    Acute on chronic heart failure with preserved ejection fraction (HFpEF) (HCC) 06/05/2019   Anemia    Anemia due to stage 4 chronic kidney disease (HCC) 08/27/2019   Anxiety    CHF (congestive heart failure) (HCC)    Chronic kidney disease    stage V; end stage renal disease   Chronic kidney disease, stage 5 (HCC) 01/23/2019   Chronic low back pain    Depression    Diabetes mellitus without complication (HCC)    Essential hypertension, malignant 05/24/2013   GERD (gastroesophageal reflux disease)    Hematoma 07/17/2020   Hyperlipidemia    Hypertension    MI (myocardial infarction) (HCC)    Obesity    Proteinuria 01/23/2019   Respiratory failure (HCC) 04/09/2017   SOB (shortness of breath) 05/24/2013    Unilateral inguinal hernia with obstruction and without gangrene    Volume overload 08/30/2019    Past Surgical History:  Procedure Laterality Date   A/V FISTULAGRAM Left 01/15/2020   Procedure: A/V FISTULAGRAM;  Surgeon: Renford Dills, MD;  Location: [facility];  Service: Cardiovascular;  Laterality: Left;   ABDOMINAL HYSTERECTOMY     APPENDECTOMY     AV FISTULA PLACEMENT Left 11/13/2019   Procedure: INSERTION OF ARTERIOVENOUS (AV) GORE-TEX GRAFT ARM ( BRACHIAL AXILLARY );  Surgeon: Renford Dills, MD;  Location: [facility] OR[facility];  Service: Vascular;  Laterality: Left;   DIALYSIS/PERMA CATHETER INSERTION N/A 06/07/2019   Procedure: DIALYSIS/PERMA CATHETER INSERTION;  Surgeon: Renford Dills, MD;  Location: [facility];  Service: Cardiovascular;  Laterality: N/A;   DIALYSIS/PERMA CATHETER REMOVAL N/A 02/11/2020   Procedure: DIALYSIS/PERMA CATHETER REMOVAL;  Surgeon: Renford Dills, MD;  Location: [facility];  Service: Cardiovascular;  Laterality: N/A;   ESOPHAGOGASTRODUODENOSCOPY (EGD) WITH PROPOFOL N/A 11/30/2021   Procedure: ESOPHAGOGASTRODUODENOSCOPY (EGD) WITH PROPOFOL;  Surgeon: Toney Reil, MD;  Location: [facility]OS[facility];  Service: Gastroenterology;  Laterality: N/A;   EYE SURGERY     HERNIA REPAIR     INCISION AND DRAINAGE PERIRECTAL ABSCESS     INGUINAL HERNIA  REPAIR N/A 07/12/2020   Procedure: HERNIA REPAIR INGUINAL ADULT;  Surgeon: Henrene Dodge, MD;  Location: [facility];  Service: General;  Laterality: N/A;   KNEE SURGERY     THYROID SURGERY     VAGINAL HYSTERECTOMY      Social History   Socioeconomic History   Marital status: Widowed    Spouse name: Not on file   Number of children: 1   Years of education: Not on file   Highest education level: 11th grade  Occupational History   Occupation: retired  Tobacco Use   Smoking status: Former    Types: Cigarettes   Smokeless tobacco: Never   Tobacco comments:    Quit in 2015-2016   Vaping Use   Vaping status: Never Used  Substance and Sexual Activity   Alcohol use: No   Drug use: No   Sexual activity: Never  Other Topics Concern   Not on file  Social History Narrative   Lives in [place]; self; friends do chores; quit smoking many years; no alcohol. Worked in [employer].    Social Determinants of Health   Financial Resource Strain: Low Risk  (11/01/2022)   Overall Financial Resource Strain (CARDIA)    Difficulty of Paying Living Expenses: Not hard at all  Food Insecurity: No Food Insecurity (04/08/2023)   Hunger Vital Sign    Worried About Running Out of Food in the Last Year: Never true    Ran Out of Food in the Last Year: Never true  Transportation Needs: No Transportation Needs (04/08/2023)   PRAPARE - Administrator, Civil Service (Medical): No    Lack of Transportation (Non-Medical): No  Physical Activity: Inactive (11/01/2022)   Exercise Vital Sign    Days of Exercise per Week: 0 days    Minutes of Exercise per Session: 0 min  Stress: No Stress Concern Present (11/01/2022)   [employer] of Occupational Health - Occupational Stress Questionnaire    Feeling of Stress : Not at all  Social Connections: Socially Isolated (11/01/2022)   Social Connection and Isolation Panel [NHANES]    Frequency of Communication with Friends and Family: More than three times a week    Frequency of Social Gatherings with Friends and Family: Once a week    Attends Religious Services: Never    Database administrator or Organizations: No    Attends Banker Meetings: Never    Marital Status: Widowed  Intimate Partner Violence: Not At Risk (04/08/2023)   Humiliation, Afraid, Rape, and Kick questionnaire    Fear of Current or Ex-Partner: No    Emotionally Abused: No    Physically Abused: No    Sexually Abused: No    Family History  Problem Relation Age of Onset   Heart attack Mother    Heart disease Sister    Cancer Sister    Allergies   Allergen Reactions   Antihistamines, Chlorpheniramine-Type Other (See Comments)    Unknown reaction.   Other Other (See Comments)   Paxil [Paroxetine]     "makes her feel funny" not in a good way   Codeine Rash and Other (See Comments)    Mouth sore   I? Current Facility-Administered Medications  Medication Dose Route Frequency Provider Last Rate Last Admin   (feeding supplement) PROSource Plus liquid 30 mL  30 mL Oral BID BM Kc, Ramesh, MD   30 mL at 04/11/23 1407   acetaminophen (TYLENOL) tablet 650 mg  650 mg Oral Q6H PRN  Andris Baumann, MD       ALPRAZolam Prudy Feeler) tablet 0.5 mg  0.5 mg Oral TID PRN Arrien, York Ram, MD   0.5 mg at 04/09/23 1242   brimonidine (ALPHAGAN) 0.2 % ophthalmic solution 1 drop  1 drop Both Eyes BID Arrien, York Ram, MD   1 drop at 04/11/23 0847   carvedilol (COREG) tablet 3.125 mg  3.125 mg Oral BID Coralie Keens, MD   3.125 mg at 04/11/23 0847   cefTRIAXone (ROCEPHIN) 2 g in sodium chloride 0.9 % 100 mL IVPB  2 g Intravenous Q24H Aleda Grana, RPH 200 mL/hr at 04/11/23 1610 2 g at 04/11/23 9604   feeding supplement (ENSURE ENLIVE / ENSURE PLUS) liquid 237 mL  237 mL Oral TID BM Lanae Boast, MD   237 mL at 04/11/23 1407   heparin injection 5,000 Units  5,000 Units Subcutaneous Q12H Floydene Flock, MD   5,000 Units at 04/11/23 5409   multivitamin (RENA-VIT) tablet 1 tablet  1 tablet Oral Daily Arrien, York Ram, MD   1 tablet at 04/11/23 0846   ondansetron (ZOFRAN) tablet 4 mg  4 mg Oral Q6H PRN Floydene Flock, MD       Or   ondansetron [facility]) injection 4 mg  4 mg Intravenous Q6H PRN Floydene Flock, MD       pantoprazole (PROTONIX) EC tablet 40 mg  40 mg Oral Daily Arrien, York Ram, MD   40 mg at 04/11/23 0847   sertraline (ZOLOFT) tablet 25 mg  25 mg Oral Daily Arrien, York Ram, MD   25 mg at 04/11/23 0847   torsemide (DEMADEX) tablet 100 mg  100 mg Oral Daily Arrien, York Ram, MD   100 mg at  04/11/23 0846   traZODone (DESYREL) tablet 150 mg  150 mg Oral QHS PRN Arrien, York Ram, MD         Abtx:  Anti-infectives (From admission, onward)    Start     Dose/Rate Route Frequency Ordered Stop   04/11/23 0600  cefTRIAXone (ROCEPHIN) 2 g in sodium chloride 0.9 % 100 mL IVPB        2 g 200 mL/hr over 30 Minutes Intravenous Every 24 hours 04/10/23 1740     04/10/23 2200  Ampicillin-Sulbactam (UNASYN) 3 g in sodium chloride 0.9 % 100 mL IVPB  Status:  Discontinued        3 g 200 mL/hr over 30 Minutes Intravenous Every 12 hours 04/10/23 1736 04/10/23 1740   04/08/23 0500  cefTRIAXone (ROCEPHIN) 1 g in sodium chloride 0.9 % 100 mL IVPB  Status:  Discontinued        1 g 200 mL/hr over 30 Minutes Intravenous Every 24 hours 04/07/23 0821 04/07/23 1252   04/08/23 0500  cefTRIAXone (ROCEPHIN) 2 g in sodium chloride 0.9 % 100 mL IVPB  Status:  Discontinued        2 g 200 mL/hr over 30 Minutes Intravenous Every 24 hours 04/07/23 1252 04/10/23 1736   04/07/23 0830  azithromycin (ZITHROMAX) 500 mg in sodium chloride 0.9 % 250 mL IVPB  Status:  Discontinued        500 mg 250 mL/hr over 60 Minutes Intravenous Every 24 hours 04/07/23 0821 04/08/23 1655   04/07/23 0430  cefTRIAXone (ROCEPHIN) 1 g in sodium chloride 0.9 % 100 mL IVPB        1 g 200 mL/hr over 30 Minutes Intravenous  Once 04/07/23 0426 04/07/23 0509  REVIEW OF SYSTEMS:  Const: negative fever, negative chills, ++ weight loss Eyes: negative diplopia or visual changes, negative eye pain ENT: negative coryza, ++sore throat Resp: ++ cough, --hemoptysis, ++dyspnea Cards: negative for chest pain, palpitations, lower extremity edema GU: negative for frequency, dysuria and hematuria GI: rt flank pain Skin: negative for rash and pruritus Heme: negative for easy bruising and gum/nose bleeding MS: general weakness Neurolo:negative for headaches, dizziness, vertigo, memory problems  Psych: negative for feelings of anxiety,  depression  Endocrine: diabetes Allergy/Immunology- as above Objective:  VITALS:  BP (!) 158/58   Pulse 78   Temp 97.9 F (36.6 C)   Resp 16   Ht 5\' 2"  (1.575 m)   Wt 42 kg   SpO2 100%   BMI 16.94 kg/m   PHYSICAL EXAM:  General: Alert, cooperative, no distress, responds to commands well Head: Normocephalic, without obvious abnormality, atraumatic. Eyes: Conjunctivae clear, anicteric sclerae. Pupils are equal ENT Nares normal. No drainage or sinus tenderness. Oral cavity- normal pahrynx Tongue has white patches resembling yeast Neck: in flexion Back: No CVA tenderness. Lungs: b/l air entry decreased bases Heart: systolic murmur, [id number] Abdomen: Soft, non-tender,not distended. Bowel sounds normal. No masses Extremities: atraumatic, no cyanosis. No edema. No clubbing Skin: No rashes or lesions. Or bruising Lymph: Cervical, supraclavicular normal. Neurologic: Grossly non-focal Pertinent Labs Lab Results CBC    Component Value Date/Time   WBC 9.4 04/10/2023 0210   RBC 4.08 04/10/2023 0210   HGB 11.3 (L) 04/10/2023 0210   HGB 12.5 04/06/2023 1159   HCT 36.0 04/10/2023 0210   HCT 40.8 04/06/2023 1159   PLT 213 04/10/2023 0210   PLT 238 04/06/2023 1159   MCV 88.2 04/10/2023 0210   MCV 90 04/06/2023 1159   MCV 84 05/13/2013 0344   MCH 27.7 04/10/2023 0210   MCHC 31.4 04/10/2023 0210   RDW 15.1 04/10/2023 0210   RDW 14.0 04/06/2023 1159   RDW 14.1 05/13/2013 0344   LYMPHSABS 0.8 04/09/2023 0313   LYMPHSABS 1.0 09/21/2021 1114   LYMPHSABS 0.5 (L) 05/13/2013 0344   MONOABS 0.4 04/09/2023 0313   MONOABS 0.1 (L) 05/13/2013 0344   EOSABS 0.0 04/09/2023 0313   EOSABS 0.1 09/21/2021 1114   EOSABS 0.0 05/13/2013 0344   BASOSABS 0.0 04/09/2023 0313   BASOSABS 0.0 09/21/2021 1114   BASOSABS 0.0 05/13/2013 0344       Latest Ref Rng & Units 04/11/2023    5:03 AM 04/10/2023    2:10 AM 04/09/2023    3:13 AM  CMP  Glucose 70 - 99 mg/dL 77  960  454   BUN 8 - 23 mg/dL 21   22  15    Creatinine 0.44 - 1.00 mg/dL 0.98  1.19  1.47   Sodium 135 - 145 mmol/L 137  136  137   Potassium 3.5 - 5.1 mmol/L 3.4  3.8  3.3   Chloride 98 - 111 mmol/L 99  99  100   CO2 22 - 32 mmol/L 29  27  29    Calcium 8.9 - 10.3 mg/dL 9.1  9.1  8.8       Microbiology: Recent Results (from the past 240 hour(s))  Urine Culture     Status: Abnormal   Collection Time: 04/06/23 12:00 AM   Specimen: Urine   Urine  Result Value Ref Range Status   Urine Culture, Routine Final report (A)  Final   Organism ID, Bacteria Comment (A)  Final    Comment: Streptococcus anginosus group Greater  than 100,000 colony forming units per mL Susceptibility not normally performed on this organism.   Culture, blood (routine x 2)     Status: None (Preliminary result)   Collection Time: 04/07/23  3:04 AM   Specimen: BLOOD  Result Value Ref Range Status   Specimen Description BLOOD BLOOD RIGHT ARM  Final   Special Requests   Final    BOTTLES DRAWN AEROBIC AND ANAEROBIC Blood Culture adequate volume   Culture   Final    NO GROWTH 4 DAYS Performed at [facility], [address] [place], [place] [address]    Report Status PENDING  Incomplete  Culture, blood (routine x 2)     Status: Abnormal   Collection Time: 04/07/23  3:40 AM   Specimen: BLOOD  Result Value Ref Range Status   Specimen Description   Final    BLOOD BLOOD RIGHT FOREARM Performed at [facility], [address] [place], [place] [address]    Special Requests   Final    BOTTLES DRAWN AEROBIC AND ANAEROBIC Blood Culture adequate volume Performed at [facility], [address] [place], [place] [address]    Culture  Setup Time   Final    GRAM POSITIVE COCCI ANAEROBIC BOTTLE ONLY CRITICAL RESULT CALLED TO, READ BACK BY AND VERIFIED WITH: EMILY STEINBOCK @ 2104 04/07/23 LFD  Performed at [facility], [address] [place], [place] [address]    Culture STREPTOCOCCUS ANGINOSIS (A)  Final    Report Status 04/09/2023 FINAL  Final   Organism ID, Bacteria STREPTOCOCCUS ANGINOSIS  Final      Susceptibility   Streptococcus anginosis - MIC*    PENICILLIN 0.5 INTERMEDIATE Intermediate     CEFTRIAXONE <=0.12 SENSITIVE Sensitive     ERYTHROMYCIN <=0.12 SENSITIVE Sensitive     VANCOMYCIN <=0.12 SENSITIVE Sensitive     * STREPTOCOCCUS ANGINOSIS  Blood Culture ID Panel (Reflexed)     Status: Abnormal   Collection Time: 04/07/23  3:40 AM  Result Value Ref Range Status   Enterococcus faecalis NOT DETECTED NOT DETECTED Final   Enterococcus Faecium NOT DETECTED NOT DETECTED Final   Listeria monocytogenes NOT DETECTED NOT DETECTED Final   Staphylococcus species NOT DETECTED NOT DETECTED Final   Staphylococcus aureus (BCID) NOT DETECTED NOT DETECTED Final   Staphylococcus epidermidis NOT DETECTED NOT DETECTED Final   Staphylococcus lugdunensis NOT DETECTED NOT DETECTED Final   Streptococcus species DETECTED (A) NOT DETECTED Final    Comment: Not Enterococcus species, Streptococcus agalactiae, Streptococcus pyogenes, or Streptococcus pneumoniae. EMILY STEINBOCK @ 2104 04/07/23 LFD    Streptococcus agalactiae NOT DETECTED NOT DETECTED Final   Streptococcus pneumoniae NOT DETECTED NOT DETECTED Final   Streptococcus pyogenes NOT DETECTED NOT DETECTED Final   A.calcoaceticus-baumannii NOT DETECTED NOT DETECTED Final   Bacteroides fragilis NOT DETECTED NOT DETECTED Final   Enterobacterales NOT DETECTED NOT DETECTED Final   Enterobacter cloacae complex NOT DETECTED NOT DETECTED Final   Escherichia coli NOT DETECTED NOT DETECTED Final   Klebsiella aerogenes NOT DETECTED NOT DETECTED Final   Klebsiella oxytoca NOT DETECTED NOT DETECTED Final   Klebsiella pneumoniae NOT DETECTED NOT DETECTED Final   Proteus species NOT DETECTED NOT DETECTED Final   Salmonella species NOT DETECTED NOT DETECTED Final   Serratia marcescens NOT DETECTED NOT DETECTED Final   Haemophilus influenzae NOT DETECTED NOT  DETECTED Final   Neisseria meningitidis NOT DETECTED NOT DETECTED Final   Pseudomonas aeruginosa NOT DETECTED NOT DETECTED Final   Stenotrophomonas maltophilia  NOT DETECTED NOT DETECTED Final   Candida albicans NOT DETECTED NOT DETECTED Final   Candida auris NOT DETECTED NOT DETECTED Final   Candida glabrata NOT DETECTED NOT DETECTED Final   Candida krusei NOT DETECTED NOT DETECTED Final   Candida parapsilosis NOT DETECTED NOT DETECTED Final   Candida tropicalis NOT DETECTED NOT DETECTED Final   Cryptococcus neoformans/gattii NOT DETECTED NOT DETECTED Final    Comment: Performed at [facility], [address] [place], [place] [address]  Urine Culture     Status: Abnormal   Collection Time: 04/07/23  3:53 AM   Specimen: In/Out Cath Urine  Result Value Ref Range Status   Specimen Description   Final    IN/OUT CATH URINE Performed at [facility], [address] [place], [place] [address]    Special Requests   Final    NONE Performed at [facility], [address] [place], [place] [address]    Culture (A)  Final    3,000 COLONIES/mL ENTEROCOCCUS FAECALIS >=100,000 COLONIES/mL VIRIDANS STREPTOCOCCUS    Report Status 04/10/2023 FINAL  Final   Organism ID, Bacteria ENTEROCOCCUS FAECALIS (A)  Final      Susceptibility   Enterococcus faecalis - MIC*    AMPICILLIN <=2 SENSITIVE Sensitive     VANCOMYCIN 1 SENSITIVE Sensitive     NITROFURANTOIN <=16 SENSITIVE Sensitive     * 3,000 COLONIES/mL ENTEROCOCCUS FAECALIS  Resp panel by RT-PCR (RSV, Flu A&B, Covid) Anterior Nasal Swab     Status: None   Collection Time: 04/07/23  3:53 AM   Specimen: Anterior Nasal Swab  Result Value Ref Range Status   SARS Coronavirus 2 by RT PCR NEGATIVE NEGATIVE Final    Comment: (NOTE) SARS-CoV-2 target nucleic acids are NOT DETECTED.  The SARS-CoV-2 RNA is generally detectable in upper respiratory specimens during the acute phase of infection. The lowest concentration  of SARS-CoV-2 viral copies this assay can detect is 138 copies/mL. A negative result does not preclude SARS-Cov-2 infection and should not be used as the sole basis for treatment or other patient management decisions. A negative result may occur with  improper specimen collection/handling, submission of specimen other than nasopharyngeal swab, presence of viral mutation(s) within the areas targeted by this assay, and inadequate number of viral copies(<138 copies/mL). A negative result must be combined with clinical observations, patient history, and epidemiological information. The expected result is Negative.  Fact Sheet for Patients:  BloggerCourse.com  Fact Sheet for Healthcare Providers:  SeriousBroker.it  This test is no t yet approved or cleared by the [place] FDA and  has been authorized for detection and/or diagnosis of SARS-CoV-2 by FDA under an Emergency Use Authorization (EUA). This EUA will remain  in effect (meaning this test can be used) for the duration of the COVID-19 declaration under Section 564(b)(1) of the Act, 21 U.S.C.section 360bbb-3(b)(1), unless the authorization is terminated  or revoked sooner.       Influenza A by PCR NEGATIVE NEGATIVE Final   Influenza B by PCR NEGATIVE NEGATIVE Final    Comment: (NOTE) The Xpert Xpress SARS-CoV-2/FLU/RSV plus assay is intended as an aid in the diagnosis of influenza from Nasopharyngeal swab specimens and should not be used as a sole basis for treatment. Nasal washings and aspirates are unacceptable for Xpert Xpress SARS-CoV-2/FLU/RSV testing.  Fact Sheet for Patients: BloggerCourse.com  Fact Sheet for Healthcare Providers: SeriousBroker.it  This test is not yet approved or cleared by the [place] FDA and has been  authorized for detection and/or diagnosis of SARS-CoV-2 by FDA under an Emergency Use  Authorization (EUA). This EUA will remain in effect (meaning this test can be used) for the duration of the COVID-19 declaration under Section 564(b)(1) of the Act, 21 U.S.C. section 360bbb-3(b)(1), unless the authorization is terminated or revoked.     Resp Syncytial Virus by PCR NEGATIVE NEGATIVE Final    Comment: (NOTE) Fact Sheet for Patients: BloggerCourse.com  Fact Sheet for Healthcare Providers: SeriousBroker.it  This test is not yet approved or cleared by the [place] FDA and has been authorized for detection and/or diagnosis of SARS-CoV-2 by FDA under an Emergency Use Authorization (EUA). This EUA will remain in effect (meaning this test can be used) for the duration of the COVID-19 declaration under Section 564(b)(1) of the Act, 21 U.S.C. section 360bbb-3(b)(1), unless the authorization is terminated or revoked.  Performed at [facility], [address] [place], [place] [address]   Expectorated Sputum Assessment w Gram Stain, Rflx to Resp Cult     Status: None   Collection Time: 04/09/23 12:24 PM   Specimen: Sputum  Result Value Ref Range Status   Specimen Description SPUTUM  Final   Special Requests NONE  Final   Sputum evaluation   Final    Sputum specimen not acceptable for testing.  Please recollect.   C/JOAN WILLIS AT 1329 04/09/23.PMF Performed at [facility], [address] [place], [place] [address]    Report Status 04/09/2023 FINAL  Final  Culture, blood (Routine X 2) w Reflex to ID Panel     Status: None (Preliminary result)   Collection Time: 04/10/23  6:10 PM   Specimen: BLOOD  Result Value Ref Range Status   Specimen Description BLOOD RIGHT ANTECUBITAL  Final   Special Requests   Final    BOTTLES DRAWN AEROBIC AND ANAEROBIC Blood Culture adequate volume   Culture   Final    NO GROWTH < 24 HOURS Performed at [facility], [address], [place], [place]  [address]    Report Status PENDING  Incomplete  Culture, blood (Routine X 2) w Reflex to ID Panel     Status: None (Preliminary result)   Collection Time: 04/10/23  6:10 PM   Specimen: BLOOD RIGHT HAND  Result Value Ref Range Status   Specimen Description BLOOD RIGHT HAND  Final   Special Requests   Final    BOTTLES DRAWN AEROBIC AND ANAEROBIC Blood Culture adequate volume   Culture   Final    NO GROWTH < 24 HOURS Performed at [facility], [address] [place], [place] [address]    Report Status PENDING  Incomplete    IMAGING RESULTS:  I have personally reviewed the films ?chronic elevation of rt hemidiaphragm  Impression/Recommendation ?Streptococcus anginosus bacteremia and presence of same orgaism in urine culture This is not a common organism to cause UTI, so this could be a spillover from bacteremia ora true UTI rt pyelonephritis Need to r/o endocaridits as on dialysis and also systolic murmur but flexion deformity of cervical spine/neck precludes safe TEE Will get 2 d echo . May need al ong course of antibiotic Currently on ceftriaxone- may switch to cefazolin which can be given during dialysis  Throat pain/sore throat- no evidence of sialadenitis or lymphadenitis Seen by ENT - no obvious pathology  Weight loss CT scan shows thickened gastric wall- ? Gastritis ?? Linitis plastica  ESRD on dialysis  Pericardial effusion seen on CT Oral yeast  infection- nystatin swish and swallow    ? ________________________________________________ Discussed with patient, requesting provider Note:  This document was prepared using Dragon voice recognition software and may include unintentional dictation errors.

## 2023-04-11 NOTE — Progress Notes (Signed)
[facility]  ROUNDING NOTE   Subjective:   Ms. WINONA SERLIN was admitted to [facility] on 04/07/2023 for Hypokalemia [E87.6] Pericardial effusion [I31.39] Pneumonia [J18.9] Failure to thrive in adult [R62.7] Urinary tract infection without hematuria, site unspecified [N39.0] Community acquired pneumonia, unspecified laterality [J18.9]   Update:  Patient seen laying in bed Breakfast tray at bedside, awaiting assistance with tray Dialysis received yesterday, tolerated well    Objective:  Vital signs in last 24 hours:  Temp:  [97.9 F (36.6 C)-98.1 F (36.7 C)] 97.9 F (36.6 C) (12/10 0729) Pulse Rate:  [68-89] 68 (12/10 0729) Resp:  [15-22] 16 (12/10 0729) BP: (135-161)/(58-63) 161/59 (12/10 0729) SpO2:  [96 %-99 %] 96 % (12/10 0729)  Weight change:  Filed Weights   04/08/23 1922 04/08/23 2314 04/10/23 1000  Weight: 44.6 kg 43.6 kg 42 kg    Intake/Output: No intake/output data recorded.   Intake/Output this shift:  No intake/output data recorded.  Physical Exam: General: NAD,   Head: +dysphonic voice   Eyes: Anicteric  Lungs:  Clear to auscultation, normal effort  Heart: Regular rate and rhythm  Abdomen:  Soft, nontender,   Extremities:  no peripheral edema.  Neurologic: Alert and oriented, moving all four extremities  Skin: No lesions  Access: AVF    Basic Metabolic Panel: Recent Labs  Lab 04/06/23 1653 04/06/23 1654 04/08/23 0313 04/09/23 0313 04/10/23 0210 04/11/23 0503  NA  --  139 143 137 136 137  K  --  2.6* 3.1* 3.3* 3.8 3.4*  CL  --  100 108 100 99 99  CO2  --  30 27 29 27 29   GLUCOSE  --  107* 102* 131* 186* 77  BUN  --  22 29* 15 22 21   CREATININE  --  2.36* 2.52* 1.60* 2.30* 2.09*  CALCIUM  --  9.1 8.7* 8.8* 9.1 9.1  MG 1.8  --  2.1  --   --   --   PHOS 3.6  --  3.4  --  4.2  --     Liver Function Tests: Recent Labs  Lab 04/06/23 1159 04/08/23 0313 04/10/23 0210  AST 23 18  --   ALT 17 15  --   ALKPHOS 275* 194*   --   BILITOT 0.3 0.7  --   PROT 6.6 5.9*  --   ALBUMIN 3.8 2.9* 2.6*   Recent Labs  Lab 04/06/23 1159  LIPASE 24   No results for input(s): "AMMONIA" in the last 168 hours.  CBC: Recent Labs  Lab 04/06/23 1159 04/06/23 1654 04/08/23 0313 04/09/23 0313 04/10/23 0210  WBC 6.8 8.0 12.2* 9.5 9.4  NEUTROABS  --  6.7  --  8.3*  --   HGB 12.5 12.3 11.6* 11.6* 11.3*  HCT 40.8 38.9 37.0 36.0 36.0  MCV 90 89.4 87.9 86.3 88.2  PLT 238 213 212 197 213    Cardiac Enzymes: No results for input(s): "CKTOTAL", "CKMB", "CKMBINDEX", "TROPONINI" in the last 168 hours.  BNP: Invalid input(s): "POCBNP"  CBG: No results for input(s): "GLUCAP" in the last 168 hours.  Microbiology: Results for orders placed or performed during the hospital encounter of 04/07/23  Culture, blood (routine x 2)     Status: None (Preliminary result)   Collection Time: 04/07/23  3:04 AM   Specimen: BLOOD  Result Value Ref Range Status   Specimen Description BLOOD BLOOD RIGHT ARM  Final   Special Requests   Final    BOTTLES DRAWN  AEROBIC AND ANAEROBIC Blood Culture adequate volume   Culture   Final    NO GROWTH 4 DAYS Performed at [facility], [address] [place], [place] [address]    Report Status PENDING  Incomplete  Culture, blood (routine x 2)     Status: Abnormal   Collection Time: 04/07/23  3:40 AM   Specimen: BLOOD  Result Value Ref Range Status   Specimen Description   Final    BLOOD BLOOD RIGHT FOREARM Performed at [facility], [address] [place], [place] [address]    Special Requests   Final    BOTTLES DRAWN AEROBIC AND ANAEROBIC Blood Culture adequate volume Performed at [facility] [place], [address] [place], [place] [address]    Culture  Setup Time   Final    GRAM POSITIVE COCCI ANAEROBIC BOTTLE ONLY CRITICAL RESULT CALLED TO, READ BACK BY AND VERIFIED WITH: EMILY STEINBOCK @ 2104 04/07/23 LFD  Performed at [facility], [address], [place], [place] [address]    Culture STREPTOCOCCUS ANGINOSIS (A)  Final   Report Status 04/09/2023 FINAL  Final   Organism ID, Bacteria STREPTOCOCCUS ANGINOSIS  Final      Susceptibility   Streptococcus anginosis - MIC*    PENICILLIN 0.5 INTERMEDIATE Intermediate     CEFTRIAXONE <=0.12 SENSITIVE Sensitive     ERYTHROMYCIN <=0.12 SENSITIVE Sensitive     VANCOMYCIN <=0.12 SENSITIVE Sensitive     * STREPTOCOCCUS ANGINOSIS  Blood Culture ID Panel (Reflexed)     Status: Abnormal   Collection Time: 04/07/23  3:40 AM  Result Value Ref Range Status   Enterococcus faecalis NOT DETECTED NOT DETECTED Final   Enterococcus Faecium NOT DETECTED NOT DETECTED Final   Listeria monocytogenes NOT DETECTED NOT DETECTED Final   Staphylococcus species NOT DETECTED NOT DETECTED Final   Staphylococcus aureus (BCID) NOT DETECTED NOT DETECTED Final   Staphylococcus epidermidis NOT DETECTED NOT DETECTED Final   Staphylococcus lugdunensis NOT DETECTED NOT DETECTED Final   Streptococcus species DETECTED (A) NOT DETECTED Final    Comment: Not Enterococcus species, Streptococcus agalactiae, Streptococcus pyogenes, or Streptococcus pneumoniae. EMILY STEINBOCK @ 2104 04/07/23 LFD    Streptococcus agalactiae NOT DETECTED NOT DETECTED Final   Streptococcus pneumoniae NOT DETECTED NOT DETECTED Final   Streptococcus pyogenes NOT DETECTED NOT DETECTED Final   A.calcoaceticus-baumannii NOT DETECTED NOT DETECTED Final   Bacteroides fragilis NOT DETECTED NOT DETECTED Final   Enterobacterales NOT DETECTED NOT DETECTED Final   Enterobacter cloacae complex NOT DETECTED NOT DETECTED Final   Escherichia coli NOT DETECTED NOT DETECTED Final   Klebsiella aerogenes NOT DETECTED NOT DETECTED Final   Klebsiella oxytoca NOT DETECTED NOT DETECTED Final   Klebsiella pneumoniae NOT DETECTED NOT DETECTED Final   Proteus species NOT DETECTED NOT DETECTED Final   Salmonella species NOT DETECTED NOT DETECTED Final   Serratia marcescens  NOT DETECTED NOT DETECTED Final   Haemophilus influenzae NOT DETECTED NOT DETECTED Final   Neisseria meningitidis NOT DETECTED NOT DETECTED Final   Pseudomonas aeruginosa NOT DETECTED NOT DETECTED Final   Stenotrophomonas maltophilia NOT DETECTED NOT DETECTED Final   Candida albicans NOT DETECTED NOT DETECTED Final   Candida auris NOT DETECTED NOT DETECTED Final   Candida glabrata NOT DETECTED NOT DETECTED Final   Candida krusei NOT DETECTED NOT DETECTED Final   Candida parapsilosis NOT DETECTED NOT DETECTED Final   Candida tropicalis NOT DETECTED NOT DETECTED Final   Cryptococcus neoformans/gattii NOT DETECTED NOT DETECTED Final  Comment: Performed at [facility], [address] [place], [place] [address]  Urine Culture     Status: Abnormal   Collection Time: 04/07/23  3:53 AM   Specimen: In/Out Cath Urine  Result Value Ref Range Status   Specimen Description   Final    IN/OUT CATH URINE Performed at [facility], [address] [place], [place] [address]    Special Requests   Final    NONE Performed at [facility], [address] [place], [place] [address]    Culture (A)  Final    3,000 COLONIES/mL ENTEROCOCCUS FAECALIS >=100,000 COLONIES/mL VIRIDANS STREPTOCOCCUS    Report Status 04/10/2023 FINAL  Final   Organism ID, Bacteria ENTEROCOCCUS FAECALIS (A)  Final      Susceptibility   Enterococcus faecalis - MIC*    AMPICILLIN <=2 SENSITIVE Sensitive     VANCOMYCIN 1 SENSITIVE Sensitive     NITROFURANTOIN <=16 SENSITIVE Sensitive     * 3,000 COLONIES/mL ENTEROCOCCUS FAECALIS  Resp panel by RT-PCR (RSV, Flu A&B, Covid) Anterior Nasal Swab     Status: None   Collection Time: 04/07/23  3:53 AM   Specimen: Anterior Nasal Swab  Result Value Ref Range Status   SARS Coronavirus 2 by RT PCR NEGATIVE NEGATIVE Final    Comment: (NOTE) SARS-CoV-2 target nucleic acids are NOT DETECTED.  The SARS-CoV-2 RNA is generally detectable in upper  respiratory specimens during the acute phase of infection. The lowest concentration of SARS-CoV-2 viral copies this assay can detect is 138 copies/mL. A negative result does not preclude SARS-Cov-2 infection and should not be used as the sole basis for treatment or other patient management decisions. A negative result may occur with  improper specimen collection/handling, submission of specimen other than nasopharyngeal swab, presence of viral mutation(s) within the areas targeted by this assay, and inadequate number of viral copies(<138 copies/mL). A negative result must be combined with clinical observations, patient history, and epidemiological information. The expected result is Negative.  Fact Sheet for Patients:  BloggerCourse.com  Fact Sheet for Healthcare Providers:  SeriousBroker.it  This test is no t yet approved or cleared by the [place] FDA and  has been authorized for detection and/or diagnosis of SARS-CoV-2 by FDA under an Emergency Use Authorization (EUA). This EUA will remain  in effect (meaning this test can be used) for the duration of the COVID-19 declaration under Section 564(b)(1) of the Act, 21 U.S.C.section 360bbb-3(b)(1), unless the authorization is terminated  or revoked sooner.       Influenza A by PCR NEGATIVE NEGATIVE Final   Influenza B by PCR NEGATIVE NEGATIVE Final    Comment: (NOTE) The Xpert Xpress SARS-CoV-2/FLU/RSV plus assay is intended as an aid in the diagnosis of influenza from Nasopharyngeal swab specimens and should not be used as a sole basis for treatment. Nasal washings and aspirates are unacceptable for Xpert Xpress SARS-CoV-2/FLU/RSV testing.  Fact Sheet for Patients: BloggerCourse.com  Fact Sheet for Healthcare Providers: SeriousBroker.it  This test is not yet approved or cleared by the [place] FDA and has been  authorized for detection and/or diagnosis of SARS-CoV-2 by FDA under an Emergency Use Authorization (EUA). This EUA will remain in effect (meaning this test can be used) for the duration of the COVID-19 declaration under Section 564(b)(1) of the Act, 21 U.S.C. section 360bbb-3(b)(1), unless the authorization is terminated or revoked.     Resp Syncytial Virus by PCR NEGATIVE NEGATIVE Final    Comment: (NOTE) Fact Sheet  for Patients: BloggerCourse.com  Fact Sheet for Healthcare Providers: SeriousBroker.it  This test is not yet approved or cleared by the [place] FDA and has been authorized for detection and/or diagnosis of SARS-CoV-2 by FDA under an Emergency Use Authorization (EUA). This EUA will remain in effect (meaning this test can be used) for the duration of the COVID-19 declaration under Section 564(b)(1) of the Act, 21 U.S.C. section 360bbb-3(b)(1), unless the authorization is terminated or revoked.  Performed at [facility], [address] [place], [place] [address]   Expectorated Sputum Assessment w Gram Stain, Rflx to Resp Cult     Status: None   Collection Time: 04/09/23 12:24 PM   Specimen: Sputum  Result Value Ref Range Status   Specimen Description SPUTUM  Final   Special Requests NONE  Final   Sputum evaluation   Final    Sputum specimen not acceptable for testing.  Please recollect.   C/JOAN WILLIS AT 1329 04/09/23.PMF Performed at [facility], [address] [place], [place] [address]    Report Status 04/09/2023 FINAL  Final  Culture, blood (Routine X 2) w Reflex to ID Panel     Status: None (Preliminary result)   Collection Time: 04/10/23  6:10 PM   Specimen: BLOOD  Result Value Ref Range Status   Specimen Description BLOOD RIGHT ANTECUBITAL  Final   Special Requests   Final    BOTTLES DRAWN AEROBIC AND ANAEROBIC Blood Culture adequate volume   Culture   Final    NO GROWTH <  24 HOURS Performed at [facility], [address], [place], [place] [address]    Report Status PENDING  Incomplete  Culture, blood (Routine X 2) w Reflex to ID Panel     Status: None (Preliminary result)   Collection Time: 04/10/23  6:10 PM   Specimen: BLOOD RIGHT HAND  Result Value Ref Range Status   Specimen Description BLOOD RIGHT HAND  Final   Special Requests   Final    BOTTLES DRAWN AEROBIC AND ANAEROBIC Blood Culture adequate volume   Culture   Final    NO GROWTH < 24 HOURS Performed at [facility], [address], [place], [place] [address]    Report Status PENDING  Incomplete    Coagulation Studies: No results for input(s): "LABPROT", "INR" in the last 72 hours.  Urinalysis: No results for input(s): "COLORURINE", "LABSPEC", "PHURINE", "GLUCOSEU", "HGBUR", "BILIRUBINUR", "KETONESUR", "PROTEINUR", "UROBILINOGEN", "NITRITE", "LEUKOCYTESUR" in the last 72 hours.  Invalid input(s): "APPERANCEUR"     Imaging: No results found.   Medications:    cefTRIAXone (ROCEPHIN)  IV 2 g (04/11/23 2956)    (feeding supplement) PROSource Plus  30 mL Oral BID BM   brimonidine  1 drop Both Eyes BID   carvedilol  3.125 mg Oral BID   feeding supplement  237 mL Oral TID BM   heparin  5,000 Units Subcutaneous Q12H   multivitamin  1 tablet Oral Daily   pantoprazole  40 mg Oral Daily   sertraline  25 mg Oral Daily   torsemide  100 mg Oral Daily   acetaminophen, ALPRAZolam, ondansetron **OR** ondansetron (ZOFRAN) IV, traZODone  Assessment/ Plan:  Ms. AZARIYA WALTMAN is a 89 y.o.  female wth end stage renal disease on hemodialysis, hypertension, congestive heart failure, depression, GERD who presents to [facility] with 04/07/2023 with Hypokalemia [E87.6] Pericardial effusion [I31.39] Pneumonia [J18.9] Failure to thrive in adult [R62.7] Urinary tract infection without hematuria, site unspecified [N39.0] Community acquired pneumonia,  unspecified laterality  [J18.9]  [facility] Left AVF 43kg  End Stage Renal Disease: Dialysis received yesterday, UF 0.5 L achieved.  Next treatment scheduled for Wednesday.  Hypertension with chronic kidney disease: Continue torsemide.  Blood pressure remains acceptable for this patient.  Anemia with chronic kidney disease: mircera as outpatient. Hemoglobin acceptable at last check.  Secondary Hyperparathyroidism: currently holding phosphate binders. Gets parsabiv outpatient.  Calcium within desired range for renal patient.   LOS: 4 Talullah Abate 12/10/20241:06 PM

## 2023-04-11 NOTE — Progress Notes (Signed)
PROGRESS NOTE ADEBOLA BIRCHARD  NFA:[id number] DOB: 11-15-1931 DOA: 04/07/2023 PCP: [name], MD  Brief Narrative/Hospital Course: Mrs. Gannaway was admitted to the hospital with the working diagnosis of failure to thrive.   89 yo female with the past medical history of heart failure, hypertension, ESRD, and gastritis who presented with difficulty swallowing for the last 2 to 3 weeks prior to hospitalization. At home positive weight loss. Recent outpatient evaluation, recommended ENT referral for salivary gland enlargement. On her initial physical examination in the ED her blood pressure was 156/64, HR 76, RR 18 and 02 saturation 96%, lungs with no wheezing or rhonchi, heart with S1 and S2 present and regular, abdomen with no distention, positive mild pain to palpation mid right side, no lower extremity edema.   Na 139, K 2,6 Cl 100, bicarbonate 30, glucose 107, bun 22 cr 2,36  Wbc 8,0 hgb 12.3 plt 213  Sars covid 19 negative  Urine analysis SG 1,013, protein 100, leukocytes large, moderate hgb, > 50 rbc, > 50 wbc.   CT abdomen with cholelithiasis, colonic diverticulosis, moderate to marked severity diffuse gastric wall thickening with may represent gastritis.   CT soft tissue neck with limited evaluation. Advance degenerative changes in cervical spine.   CXR>hypoinflation, cardiomegaly with elevation of right hemidiaphragm.  EKG 78 bpm, left axis deviation, left anterior fascicular block, sinus rhythm with no significant ST segment or T wave changes.  12/07 diet advance to dysphagia 2 diet. HD .  12/08 patient continue very weak and deconditioned, placed on steroids for mild edema noted on laryngoscopy.  S/P HD 12/09.  Blood culture repeated, urine culture with Enterococcus sensitive to ampicillin , blood cx 12/6-Streptococcus not Enterococcus  TOC consulted for disposition and planning for skilled nursing facility disposition once bed available.    Subjective: Patient  seen and examined Repeat blood culture pending from 12/9 Overall doing well, no neck pain Overnight afebrile BP stable, underwent dialysis 12/9.  Awaiting for SNF  Assessment and Plan: Principal Problem:   UTI (urinary tract infection) Active Problems:   ESRD (end stage renal disease) (HCC)   Dysphagia   Essential hypertension   Diabetes (HCC)   Chronic diastolic CHF (congestive heart failure) (HCC)   Anxiety   UTI Urine culture with 3.000 CF enterococcus, possible contamination. Complete abx  Streptococcus bacteremia in 1 set: ?source/ vs contamination. Enlarged salivary gland on left side on admission but today is nontender on exam, CT soft tissue neck 12/6 no acute finding but limited without IV dye.  Repeat blood culture ordered 12/9, continue Rocephin . Will d/w ID  Acute hypoxemic respiratory failure: Resolved, continue incentive spirometry, OOB.  Pneumonia ruled out  ESRD  Hypokalemia Metabolic bone disease: Labs stable in the setting of ESRD, continue HD per nephrology.  Recent Labs    04/06/23 1159 04/06/23 1654 04/08/23 0313 04/09/23 0313 04/10/23 0210 04/11/23 0503  BUN 20 22 29* 15 22 21   CREATININE 2.16* 2.36* 2.52* 1.60* 2.30* 2.09*  CO2 28 30 27 29 27 29   K 3.5 2.6* 3.1* 3.3* 3.8 3.4*    Anemia of kidney disease: Hemoglobin is stable.  Transfuse for less than 7 g.  Monitor Recent Labs    04/06/23 1159 04/06/23 1654 04/06/23 1654 04/08/23 0313 04/09/23 0313 04/10/23 0210  HGB 12.5 12.3  --  11.6* 11.6* 11.3*  MCV 90 89.4   < > 87.9 86.3 88.2   < > = values in this interval not displayed.    HTN: BP controlled  on torsemide, Coreg  Dysphagia Patient with significant cervical spine disease.  Supportive care PPI. Per ENT recommendations will do a short course of prednisone for laryngeal edema.   Diabetes; Blood sugar stable in 70s to 180s   Chronic diastolic CHF No signs of fluid overload continue ultrafiltration and dialysis  cont  torsemide"   Anxiety disorder: Continue sertraline and trazodone.   Failure to thrive  Debility/deconditioning   Body mass index is 16.94 kg/m.  Consult RD and augment diet  DVT prophylaxis: heparin injection 5,000 Units Start: 04/07/23 1000 Code Status:   Code Status: Full Code Family Communication: plan of care discussed with patient at bedside. Patient status is: Remains hospitalized because of SNF PENDING Level of care: Med-Surg   Dispo: The patient is from: HOME            Anticipated disposition: snf once bed available.   Objective: Vitals last 24 hrs: Vitals:   04/10/23 1442 04/10/23 1614 04/10/23 2319 04/11/23 0729  BP:  (!) 141/63 (!) 135/58 (!) 161/59  Pulse:  89 75 68  Resp: (!) 22 15 18 16   Temp:  98.1 F (36.7 C) 98 F (36.7 C) 97.9 F (36.6 C)  TempSrc:      SpO2:  99% 98% 96%  Weight:      Height:       Weight change:   Physical Examination: General exam: alert awake, thin frail  HEENT:Oral mucosa moist, Ear/Nose WNL grossly Respiratory system: Bilaterally clear BS,no use of accessory muscle Cardiovascular system: S1 & S2 +, No JVD. Gastrointestinal system: Abdomen soft,NT,ND, BS+ Nervous System: Alert, awake, moving all extremities,and following commands. Extremities: LE edema neg,distal peripheral pulses palpable and warm.  Skin: No rashes,no icterus. MSK: Normal muscle bulk,tone, power   Medications reviewed:  Scheduled Meds:  brimonidine  1 drop Both Eyes BID   carvedilol  3.125 mg Oral BID   feeding supplement (NEPRO CARB STEADY)  237 mL Oral BID BM   heparin  5,000 Units Subcutaneous Q12H   multivitamin  1 tablet Oral Daily   pantoprazole  40 mg Oral Daily   sertraline  25 mg Oral Daily   torsemide  100 mg Oral Daily   Continuous Infusions:  cefTRIAXone (ROCEPHIN)  IV 2 g (04/11/23 6213)    Diet Order             DIET DYS 2 Fluid consistency: Thin  Diet effective now                  No intake or output data in the 24 hours  ending 04/11/23 1212  Net IO Since Admission: -110 mL [04/11/23 1212]  Wt Readings from Last 3 Encounters:  04/10/23 42 kg  04/06/23 36.5 kg  02/02/23 52.6 kg     Unresulted Labs (From admission, onward)     Start     Ordered   04/07/23 0821  Legionella Pneumophila Serogp 1 Ur Ag  (COPD / Pneumonia / Cellulitis / Lower Extremity Wound)  Once,   R        04/07/23 0865   Signed and Held  Renal function panel  Once,   R        Signed and Held   Signed and Held  CBC  Once,   R        Signed and Held   Signed and Held  Comprehensive metabolic panel  Once,   R       Question:  Specimen collection method  Answer:  Lab=Lab collect   Signed and Held   Pending  Hepatitis B surface antibody,quantitative  Once,   R       Question:  Specimen collection method  Answer:  Lab=Lab collect   Pending          Data Reviewed: I have personally reviewed following labs and imaging studies CBC: Recent Labs  Lab 04/06/23 1159 04/06/23 1654 04/08/23 0313 04/09/23 0313 04/10/23 0210  WBC 6.8 8.0 12.2* 9.5 9.4  NEUTROABS  --  6.7  --  8.3*  --   HGB 12.5 12.3 11.6* 11.6* 11.3*  HCT 40.8 38.9 37.0 36.0 36.0  MCV 90 89.4 87.9 86.3 88.2  PLT 238 213 212 197 213   Basic Metabolic Panel:  Recent Labs  Lab 04/06/23 1653 04/06/23 1654 04/08/23 0313 04/09/23 0313 04/10/23 0210 04/11/23 0503  NA  --  139 143 137 136 137  K  --  2.6* 3.1* 3.3* 3.8 3.4*  CL  --  100 108 100 99 99  CO2  --  30 27 29 27 29   GLUCOSE  --  107* 102* 131* 186* 77  BUN  --  22 29* 15 22 21   CREATININE  --  2.36* 2.52* 1.60* 2.30* 2.09*  CALCIUM  --  9.1 8.7* 8.8* 9.1 9.1  MG 1.8  --  2.1  --   --   --   PHOS 3.6  --  3.4  --  4.2  --    GFR: Estimated Creatinine Clearance: 12.1 mL/min (A) (by C-G formula based on SCr of 2.09 mg/dL (H)). Liver Function Tests:  Recent Labs  Lab 04/06/23 1159 04/08/23 0313 04/10/23 0210  AST 23 18  --   ALT 17 15  --   ALKPHOS 275* 194*  --   BILITOT 0.3 0.7  --   PROT 6.6  5.9*  --   ALBUMIN 3.8 2.9* 2.6*   Recent Labs  Lab 04/06/23 1159  LIPASE 24  Sepsis Labs: Recent Labs  Lab 04/07/23 0304 04/07/23 0652  LATICACIDVEN 1.2 1.8   Recent Results (from the past 240 hour(s))  Urine Culture     Status: Abnormal   Collection Time: 04/06/23 12:00 AM   Specimen: Urine   Urine  Result Value Ref Range Status   Urine Culture, Routine Final report (A)  Final   Organism ID, Bacteria Comment (A)  Final    Comment: Streptococcus anginosus group Greater than 100,000 colony forming units per mL Susceptibility not normally performed on this organism.   Culture, blood (routine x 2)     Status: None (Preliminary result)   Collection Time: 04/07/23  3:04 AM   Specimen: BLOOD  Result Value Ref Range Status   Specimen Description BLOOD BLOOD RIGHT ARM  Final   Special Requests   Final    BOTTLES DRAWN AEROBIC AND ANAEROBIC Blood Culture adequate volume   Culture   Final    NO GROWTH 4 DAYS Performed at [facility], [address], [place], [place] [address]    Report Status PENDING  Incomplete  Culture, blood (routine x 2)     Status: Abnormal   Collection Time: 04/07/23  3:40 AM   Specimen: BLOOD  Result Value Ref Range Status   Specimen Description   Final    BLOOD BLOOD RIGHT FOREARM Performed at [facility], [address] [place], [place] [address]    Special Requests   Final    BOTTLES DRAWN AEROBIC  AND ANAEROBIC Blood Culture adequate volume Performed at [facility], [address] [place], [place] [address]    Culture  Setup Time   Final    GRAM POSITIVE COCCI ANAEROBIC BOTTLE ONLY CRITICAL RESULT CALLED TO, READ BACK BY AND VERIFIED WITH: EMILY STEINBOCK @ 2104 04/07/23 LFD  Performed at [facility], [address], [place], [place] [address]    Culture STREPTOCOCCUS ANGINOSIS (A)  Final   Report Status 04/09/2023 FINAL  Final   Organism ID, Bacteria STREPTOCOCCUS ANGINOSIS  Final       Susceptibility   Streptococcus anginosis - MIC*    PENICILLIN 0.5 INTERMEDIATE Intermediate     CEFTRIAXONE <=0.12 SENSITIVE Sensitive     ERYTHROMYCIN <=0.12 SENSITIVE Sensitive     VANCOMYCIN <=0.12 SENSITIVE Sensitive     * STREPTOCOCCUS ANGINOSIS  Blood Culture ID Panel (Reflexed)     Status: Abnormal   Collection Time: 04/07/23  3:40 AM  Result Value Ref Range Status   Enterococcus faecalis NOT DETECTED NOT DETECTED Final   Enterococcus Faecium NOT DETECTED NOT DETECTED Final   Listeria monocytogenes NOT DETECTED NOT DETECTED Final   Staphylococcus species NOT DETECTED NOT DETECTED Final   Staphylococcus aureus (BCID) NOT DETECTED NOT DETECTED Final   Staphylococcus epidermidis NOT DETECTED NOT DETECTED Final   Staphylococcus lugdunensis NOT DETECTED NOT DETECTED Final   Streptococcus species DETECTED (A) NOT DETECTED Final    Comment: Not Enterococcus species, Streptococcus agalactiae, Streptococcus pyogenes, or Streptococcus pneumoniae. EMILY STEINBOCK @ 2104 04/07/23 LFD    Streptococcus agalactiae NOT DETECTED NOT DETECTED Final   Streptococcus pneumoniae NOT DETECTED NOT DETECTED Final   Streptococcus pyogenes NOT DETECTED NOT DETECTED Final   A.calcoaceticus-baumannii NOT DETECTED NOT DETECTED Final   Bacteroides fragilis NOT DETECTED NOT DETECTED Final   Enterobacterales NOT DETECTED NOT DETECTED Final   Enterobacter cloacae complex NOT DETECTED NOT DETECTED Final   Escherichia coli NOT DETECTED NOT DETECTED Final   Klebsiella aerogenes NOT DETECTED NOT DETECTED Final   Klebsiella oxytoca NOT DETECTED NOT DETECTED Final   Klebsiella pneumoniae NOT DETECTED NOT DETECTED Final   Proteus species NOT DETECTED NOT DETECTED Final   Salmonella species NOT DETECTED NOT DETECTED Final   Serratia marcescens NOT DETECTED NOT DETECTED Final   Haemophilus influenzae NOT DETECTED NOT DETECTED Final   Neisseria meningitidis NOT DETECTED NOT DETECTED Final   Pseudomonas aeruginosa NOT  DETECTED NOT DETECTED Final   Stenotrophomonas maltophilia NOT DETECTED NOT DETECTED Final   Candida albicans NOT DETECTED NOT DETECTED Final   Candida auris NOT DETECTED NOT DETECTED Final   Candida glabrata NOT DETECTED NOT DETECTED Final   Candida krusei NOT DETECTED NOT DETECTED Final   Candida parapsilosis NOT DETECTED NOT DETECTED Final   Candida tropicalis NOT DETECTED NOT DETECTED Final   Cryptococcus neoformans/gattii NOT DETECTED NOT DETECTED Final    Comment: Performed at [facility], [address] [place], [place] [address]  Urine Culture     Status: Abnormal   Collection Time: 04/07/23  3:53 AM   Specimen: In/Out Cath Urine  Result Value Ref Range Status   Specimen Description   Final    IN/OUT CATH URINE Performed at [facility], [address], [place], [place] [address]    Special Requests   Final    NONE Performed at [facility], [address] [place], [place] [address]    Culture (A)  Final    3,000 COLONIES/mL ENTEROCOCCUS FAECALIS >=100,000 COLONIES/mL VIRIDANS  STREPTOCOCCUS    Report Status 04/10/2023 FINAL  Final   Organism ID, Bacteria ENTEROCOCCUS FAECALIS (A)  Final      Susceptibility   Enterococcus faecalis - MIC*    AMPICILLIN <=2 SENSITIVE Sensitive     VANCOMYCIN 1 SENSITIVE Sensitive     NITROFURANTOIN <=16 SENSITIVE Sensitive     * 3,000 COLONIES/mL ENTEROCOCCUS FAECALIS  Resp panel by RT-PCR (RSV, Flu A&B, Covid) Anterior Nasal Swab     Status: None   Collection Time: 04/07/23  3:53 AM   Specimen: Anterior Nasal Swab  Result Value Ref Range Status   SARS Coronavirus 2 by RT PCR NEGATIVE NEGATIVE Final    Comment: (NOTE) SARS-CoV-2 target nucleic acids are NOT DETECTED.  The SARS-CoV-2 RNA is generally detectable in upper respiratory specimens during the acute phase of infection. The lowest concentration of SARS-CoV-2 viral copies this assay can detect is 138 copies/mL. A negative result does not  preclude SARS-Cov-2 infection and should not be used as the sole basis for treatment or other patient management decisions. A negative result may occur with  improper specimen collection/handling, submission of specimen other than nasopharyngeal swab, presence of viral mutation(s) within the areas targeted by this assay, and inadequate number of viral copies(<138 copies/mL). A negative result must be combined with clinical observations, patient history, and epidemiological information. The expected result is Negative.  Fact Sheet for Patients:  BloggerCourse.com  Fact Sheet for Healthcare Providers:  SeriousBroker.it  This test is no t yet approved or cleared by the [place] FDA and  has been authorized for detection and/or diagnosis of SARS-CoV-2 by FDA under an Emergency Use Authorization (EUA). This EUA will remain  in effect (meaning this test can be used) for the duration of the COVID-19 declaration under Section 564(b)(1) of the Act, 21 U.S.C.section 360bbb-3(b)(1), unless the authorization is terminated  or revoked sooner.       Influenza A by PCR NEGATIVE NEGATIVE Final   Influenza B by PCR NEGATIVE NEGATIVE Final    Comment: (NOTE) The Xpert Xpress SARS-CoV-2/FLU/RSV plus assay is intended as an aid in the diagnosis of influenza from Nasopharyngeal swab specimens and should not be used as a sole basis for treatment. Nasal washings and aspirates are unacceptable for Xpert Xpress SARS-CoV-2/FLU/RSV testing.  Fact Sheet for Patients: BloggerCourse.com  Fact Sheet for Healthcare Providers: SeriousBroker.it  This test is not yet approved or cleared by the [place] FDA and has been authorized for detection and/or diagnosis of SARS-CoV-2 by FDA under an Emergency Use Authorization (EUA). This EUA will remain in effect (meaning this test can be used) for the  duration of the COVID-19 declaration under Section 564(b)(1) of the Act, 21 U.S.C. section 360bbb-3(b)(1), unless the authorization is terminated or revoked.     Resp Syncytial Virus by PCR NEGATIVE NEGATIVE Final    Comment: (NOTE) Fact Sheet for Patients: BloggerCourse.com  Fact Sheet for Healthcare Providers: SeriousBroker.it  This test is not yet approved or cleared by the [place] FDA and has been authorized for detection and/or diagnosis of SARS-CoV-2 by FDA under an Emergency Use Authorization (EUA). This EUA will remain in effect (meaning this test can be used) for the duration of the COVID-19 declaration under Section 564(b)(1) of the Act, 21 U.S.C. section 360bbb-3(b)(1), unless the authorization is terminated or revoked.  Performed at [facility], [address] [place], [place] [address]   Expectorated Sputum Assessment w Gram Stain, Rflx to Resp Cult     Status:  None   Collection Time: 04/09/23 12:24 PM   Specimen: Sputum  Result Value Ref Range Status   Specimen Description SPUTUM  Final   Special Requests NONE  Final   Sputum evaluation   Final    Sputum specimen not acceptable for testing.  Please recollect.   C/JOAN WILLIS AT 1329 04/09/23.PMF Performed at [facility], [address] [place], [place] [address]    Report Status 04/09/2023 FINAL  Final  Culture, blood (Routine X 2) w Reflex to ID Panel     Status: None (Preliminary result)   Collection Time: 04/10/23  6:10 PM   Specimen: BLOOD  Result Value Ref Range Status   Specimen Description BLOOD RIGHT ANTECUBITAL  Final   Special Requests   Final    BOTTLES DRAWN AEROBIC AND ANAEROBIC Blood Culture adequate volume   Culture   Final    NO GROWTH < 24 HOURS Performed at [facility], [address] [place], [place] [address]    Report Status PENDING  Incomplete  Culture, blood (Routine X 2) w Reflex to ID Panel      Status: None (Preliminary result)   Collection Time: 04/10/23  6:10 PM   Specimen: BLOOD RIGHT HAND  Result Value Ref Range Status   Specimen Description BLOOD RIGHT HAND  Final   Special Requests   Final    BOTTLES DRAWN AEROBIC AND ANAEROBIC Blood Culture adequate volume   Culture   Final    NO GROWTH < 24 HOURS Performed at [facility], [address], [place], [place] [address]    Report Status PENDING  Incomplete    Antimicrobials/Microbiology: Anti-infectives (From admission, onward)    Start     Dose/Rate Route Frequency Ordered Stop   04/11/23 0600  cefTRIAXone (ROCEPHIN) 2 g in sodium chloride 0.9 % 100 mL IVPB        2 g 200 mL/hr over 30 Minutes Intravenous Every 24 hours 04/10/23 1740     04/10/23 2200  Ampicillin-Sulbactam (UNASYN) 3 g in sodium chloride 0.9 % 100 mL IVPB  Status:  Discontinued        3 g 200 mL/hr over 30 Minutes Intravenous Every 12 hours 04/10/23 1736 04/10/23 1740   04/08/23 0500  cefTRIAXone (ROCEPHIN) 1 g in sodium chloride 0.9 % 100 mL IVPB  Status:  Discontinued        1 g 200 mL/hr over 30 Minutes Intravenous Every 24 hours 04/07/23 0821 04/07/23 1252   04/08/23 0500  cefTRIAXone (ROCEPHIN) 2 g in sodium chloride 0.9 % 100 mL IVPB  Status:  Discontinued        2 g 200 mL/hr over 30 Minutes Intravenous Every 24 hours 04/07/23 1252 04/10/23 1736   04/07/23 0830  azithromycin (ZITHROMAX) 500 mg in sodium chloride 0.9 % 250 mL IVPB  Status:  Discontinued        500 mg 250 mL/hr over 60 Minutes Intravenous Every 24 hours 04/07/23 0821 04/08/23 1655   04/07/23 0430  cefTRIAXone (ROCEPHIN) 1 g in sodium chloride 0.9 % 100 mL IVPB        1 g 200 mL/hr over 30 Minutes Intravenous  Once 04/07/23 0426 04/07/23 0509         Component Value Date/Time   SDES BLOOD RIGHT ANTECUBITAL 04/10/2023 1810   SDES BLOOD RIGHT HAND 04/10/2023 1810   SPECREQUEST  04/10/2023 1810    BOTTLES DRAWN AEROBIC AND ANAEROBIC Blood Culture adequate volume    SPECREQUEST  04/10/2023 1810    BOTTLES DRAWN AEROBIC AND ANAEROBIC Blood Culture adequate volume   CULT  04/10/2023 1810    NO GROWTH < 24 HOURS Performed at [facility], [address] [place] [place], [place] [address]    CULT  04/10/2023 1810    NO GROWTH < 24 HOURS Performed at [facility], [address] [place], [place] [address]    REPTSTATUS PENDING 04/10/2023 1810   REPTSTATUS PENDING 04/10/2023 1810     Radiology Studies: No results found.  LOS: 4 days  Total time spent in review of labs and imaging, patient evaluation, formulation of plan, documentation and communication with family: 25 minutes  Lanae Boast, MD [facility]  04/11/2023, 12:12 PM

## 2023-04-11 NOTE — Progress Notes (Signed)
Physical Therapy Treatment Patient Details Name: Lori Stanley MRN: [id number] DOB: February 03, 1932 Today's Date: 04/11/2023   History of Present Illness 89 y.o. female with PMHx of HFpEF, ESRD on hemodialysis Monday Wednesday Friday, GERD, hypertension presenting with failure to thrive, pneumonia, gastritis, Cholelithiasis and Colonic diverticulosis and UTI.    PT Comments  Pt resting in recliner upon PT arrival; agreeable to therapy; requesting to toilet.  Prior to getting to [facility], pt with liquid BM incontinence (assist required for clean-up--NT notified and came to assist).  Pt given new gown and assisted back to recliner once finished toileting.  Will continue to focus on strengthening and progressive functional mobility during hospitalization.   If plan is discharge home, recommend the following: Assistance with cooking/housework;Direct supervision/assist for financial management;Assist for transportation;Direct supervision/assist for medications management;Help with stairs or ramp for entrance;A lot of help with walking and/or transfers;A lot of help with bathing/dressing/bathroom   Can travel by private vehicle     No  Equipment Recommendations  Wheelchair (measurements PT);Wheelchair cushion (measurements PT);Rolling walker (2 wheels);BSC/3in1    Recommendations for Other Services       Precautions / Restrictions Precautions Precautions: Fall Restrictions Weight Bearing Restrictions: No     Mobility  Bed Mobility               General bed mobility comments: Deferred (pt in recliner beginning/end of session)    Transfers Overall transfer level: Needs assistance Equipment used: 1 person hand held assist Transfers: Sit to/from Stand, Bed to chair/wheelchair/BSC Sit to Stand: Min assist   Step pivot transfers: Min assist       General transfer comment: min assist to stand (from recliner x1 trial and from [facility] x1 trial); min assist stand step turn recliner to/from  [facility]    Ambulation/Gait                   Stairs             Wheelchair Mobility     Tilt Bed    Modified Rankin (Stroke Patients Only)       Balance Overall balance assessment: Needs assistance Sitting-balance support: No upper extremity supported, Feet supported Sitting balance-Leahy Scale: Good Sitting balance - Comments: steady reaching within BOS   Standing balance support: Single extremity supported Standing balance-Leahy Scale: Fair Standing balance comment: steady static standing with single UE support                            Cognition Arousal: Alert Behavior During Therapy: WFL for tasks assessed/performed Overall Cognitive Status: History of cognitive impairments - at baseline                                 General Comments: Oriented to self and location        Exercises      General Comments  Pt agreeable to PT session.      Pertinent Vitals/Pain Pain Assessment Pain Assessment: No/denies pain Pain Intervention(s): Limited activity within patient's tolerance, Monitored during session    Home Living                          Prior Function            PT Goals (current goals can now be found in the care plan section) Acute Rehab PT Goals Patient  Stated Goal: to go home PT Goal Formulation: With patient Time For Goal Achievement: 04/22/23 Potential to Achieve Goals: Fair Progress towards PT goals: Progressing toward goals    Frequency    Min 1X/week      PT Plan      Co-evaluation              AM-PAC PT "6 Clicks" Mobility   Outcome Measure  Help needed turning from your back to your side while in a flat bed without using bedrails?: A Little Help needed moving from lying on your back to sitting on the side of a flat bed without using bedrails?: A Little Help needed moving to and from a bed to a chair (including a wheelchair)?: A Little Help needed standing up from a  chair using your arms (e.g., wheelchair or bedside chair)?: A Little Help needed to walk in hospital room?: A Lot Help needed climbing 3-5 steps with a railing? : Total 6 Click Score: 15    End of Session Equipment Utilized During Treatment: Gait belt;Oxygen Activity Tolerance: Patient tolerated treatment well Patient left: in chair;with call bell/phone within reach;with chair alarm set;with family/visitor present Nurse Communication: Mobility status;Precautions PT Visit Diagnosis: Unsteadiness on feet (R26.81);Muscle weakness (generalized) (M62.81)     Time: 1610-9604 PT Time Calculation (min) (ACUTE ONLY): 28 min  Charges:    $Therapeutic Activity: 23-37 mins PT General Charges $$ ACUTE PT VISIT: 1 Visit                     Hendricks Limes, PT 04/11/23, 5:13 PM

## 2023-04-12 DIAGNOSIS — B955 Unspecified streptococcus as the cause of diseases classified elsewhere: Secondary | ICD-10-CM

## 2023-04-12 DIAGNOSIS — B954 Other streptococcus as the cause of diseases classified elsewhere: Secondary | ICD-10-CM | POA: Diagnosis not present

## 2023-04-12 DIAGNOSIS — N186 End stage renal disease: Secondary | ICD-10-CM | POA: Diagnosis not present

## 2023-04-12 DIAGNOSIS — E119 Type 2 diabetes mellitus without complications: Secondary | ICD-10-CM

## 2023-04-12 DIAGNOSIS — R1310 Dysphagia, unspecified: Secondary | ICD-10-CM | POA: Diagnosis not present

## 2023-04-12 DIAGNOSIS — R7881 Bacteremia: Secondary | ICD-10-CM

## 2023-04-12 DIAGNOSIS — N390 Urinary tract infection, site not specified: Secondary | ICD-10-CM | POA: Diagnosis not present

## 2023-04-12 LAB — MAGNESIUM: Magnesium: 1.9 mg/dL (ref 1.7–2.4)

## 2023-04-12 LAB — CBC
HCT: 37 % (ref 36.0–46.0)
Hemoglobin: 11.4 g/dL — ABNORMAL LOW (ref 12.0–15.0)
MCH: 27.4 pg (ref 26.0–34.0)
MCHC: 30.8 g/dL (ref 30.0–36.0)
MCV: 88.9 fL (ref 80.0–100.0)
Platelets: 252 10*3/uL (ref 150–400)
RBC: 4.16 MIL/uL (ref 3.87–5.11)
RDW: 14.9 % (ref 11.5–15.5)
WBC: 7.6 10*3/uL (ref 4.0–10.5)
nRBC: 0 % (ref 0.0–0.2)

## 2023-04-12 LAB — CULTURE, BLOOD (ROUTINE X 2)
Culture: NO GROWTH
Special Requests: ADEQUATE

## 2023-04-12 LAB — BASIC METABOLIC PANEL
Anion gap: 8 (ref 5–15)
BUN: 28 mg/dL — ABNORMAL HIGH (ref 8–23)
CO2: 29 mmol/L (ref 22–32)
Calcium: 9.3 mg/dL (ref 8.9–10.3)
Chloride: 103 mmol/L (ref 98–111)
Creatinine, Ser: 2.55 mg/dL — ABNORMAL HIGH (ref 0.44–1.00)
GFR, Estimated: 18 mL/min — ABNORMAL LOW (ref >60)
Glucose, Bld: 83 mg/dL (ref 70–99)
Potassium: 4.2 mmol/L (ref 3.5–5.1)
Sodium: 140 mmol/L (ref 135–145)

## 2023-04-12 LAB — PHOSPHORUS: Phosphorus: 3.6 mg/dL (ref 2.5–4.6)

## 2023-04-12 MED ORDER — HYDRALAZINE HCL 20 MG/ML IJ SOLN: 10.0000 mg | Freq: Four times a day (QID) | INTRAMUSCULAR | Status: DC | PRN

## 2023-04-12 MED ORDER — HEPARIN SODIUM (PORCINE) 1000 UNIT/ML DIALYSIS: 1000.0000 [IU] | INTRAMUSCULAR | Status: DC | PRN

## 2023-04-12 MED ORDER — PHENOL 1.4 % MT LIQD
1.0000 | OROMUCOSAL | Status: DC | PRN
  Filled 2023-04-12: qty 177

## 2023-04-12 MED ORDER — PENTAFLUOROPROP-TETRAFLUOROETH EX AERO
INHALATION_SPRAY | CUTANEOUS | Status: AC
  Filled 2023-04-12: qty 30

## 2023-04-12 MED ORDER — PENTAFLUOROPROP-TETRAFLUOROETH EX AERO: 1.0000 | INHALATION_SPRAY | CUTANEOUS | Status: DC | PRN

## 2023-04-12 MED ORDER — LIDOCAINE-PRILOCAINE 2.5-2.5 % EX CREA: 1.0000 | TOPICAL_CREAM | CUTANEOUS | Status: DC | PRN

## 2023-04-12 NOTE — Progress Notes (Signed)
  Received patient in bed to unit.   Informed consent signed and in chart.    TX duration: 3.5hrs     Transported back to floor  Hand-off given to patient's nurse. No c/o and no distress noted    Access used: L AVG Access issues: none   Total UF removed: 1.0L Medication(s) given: 2gm Ancef  Post HD weight: 39.8kg     Lynann Beaver  [facility]

## 2023-04-12 NOTE — Progress Notes (Signed)
[facility]  ROUNDING NOTE   Subjective:   Ms. SKYLYR RYGH was admitted to [facility] on 04/07/2023 for Hypokalemia [E87.6] Pericardial effusion [I31.39] Pneumonia [J18.9] Failure to thrive in adult [R62.7] Urinary tract infection without hematuria, site unspecified [N39.0] Community acquired pneumonia, unspecified laterality [J18.9]   Update:  Patient seen and evaluated during dialysis   HEMODIALYSIS FLOWSHEET:  Blood Flow Rate (mL/min): 299 mL/min Arterial Pressure (mmHg): -179.38 mmHg Venous Pressure (mmHg): 193.12 mmHg TMP (mmHg): 9.49 mmHg Ultrafiltration Rate (mL/min): 705 mL/min Dialysate Flow Rate (mL/min): 299 ml/min Dialysis Fluid Bolus: Normal Saline  Tolerating treatment well.   Objective:  Vital signs in last 24 hours:  Temp:  [98.1 F (36.7 C)-99 F (37.2 C)] 98.1 F (36.7 C) (12/11 0755) Pulse Rate:  [53-80] 74 (12/11 1100) Resp:  [16-33] 33 (12/11 1100) BP: (144-179)/(58-69) 144/64 (12/11 1100) SpO2:  [92 %-100 %] 98 % (12/11 1100) Weight:  [40.7 kg] 40.7 kg (12/11 0755)  Weight change:  Filed Weights   04/08/23 2314 04/10/23 1000 04/12/23 0755  Weight: 43.6 kg 42 kg 40.7 kg    Intake/Output: I/O last 3 completed shifts: In: 580 [P.O.:480; IV Piggyback:100] Out: -    Intake/Output this shift:  No intake/output data recorded.  Physical Exam: General: NAD  Head: +dysphonic voice   Eyes: Anicteric  Lungs:  Clear to auscultation, normal effort  Heart: Regular rate and rhythm  Abdomen:  Soft, nontender  Extremities:  no peripheral edema.  Neurologic: Alert and oriented, moving all four extremities  Skin: No lesions  Access: AVF    Basic Metabolic Panel: Recent Labs  Lab 04/06/23 1653 04/06/23 1654 04/08/23 0313 04/09/23 0313 04/10/23 0210 04/11/23 0503 04/12/23 0803  NA  --    < > 143 137 136 137 140  K  --    < > 3.1* 3.3* 3.8 3.4* 4.2  CL  --    < > 108 100 99 99 103  CO2  --    < > 27 29 27 29 29   GLUCOSE  --     < > 102* 131* 186* 77 83  BUN  --    < > 29* 15 22 21  28*  CREATININE  --    < > 2.52* 1.60* 2.30* 2.09* 2.55*  CALCIUM  --    < > 8.7* 8.8* 9.1 9.1 9.3  MG 1.8  --  2.1  --   --   --  1.9  PHOS 3.6  --  3.4  --  4.2  --  3.6   < > = values in this interval not displayed.    Liver Function Tests: Recent Labs  Lab 04/06/23 1159 04/08/23 0313 04/10/23 0210  AST 23 18  --   ALT 17 15  --   ALKPHOS 275* 194*  --   BILITOT 0.3 0.7  --   PROT 6.6 5.9*  --   ALBUMIN 3.8 2.9* 2.6*   Recent Labs  Lab 04/06/23 1159  LIPASE 24   No results for input(s): "AMMONIA" in the last 168 hours.  CBC: Recent Labs  Lab 04/06/23 1654 04/08/23 0313 04/09/23 0313 04/10/23 0210 04/12/23 0803  WBC 8.0 12.2* 9.5 9.4 7.6  NEUTROABS 6.7  --  8.3*  --   --   HGB 12.3 11.6* 11.6* 11.3* 11.4*  HCT 38.9 37.0 36.0 36.0 37.0  MCV 89.4 87.9 86.3 88.2 88.9  PLT 213 212 197 213 252    Cardiac Enzymes: No results for input(s): "  CKTOTAL", "CKMB", "CKMBINDEX", "TROPONINI" in the last 168 hours.  BNP: Invalid input(s): "POCBNP"  CBG: No results for input(s): "GLUCAP" in the last 168 hours.  Microbiology: Results for orders placed or performed during the hospital encounter of 04/07/23  Culture, blood (routine x 2)     Status: None   Collection Time: 04/07/23  3:04 AM   Specimen: BLOOD  Result Value Ref Range Status   Specimen Description BLOOD BLOOD RIGHT ARM  Final   Special Requests   Final    BOTTLES DRAWN AEROBIC AND ANAEROBIC Blood Culture adequate volume   Culture   Final    NO GROWTH 5 DAYS Performed at [facility] [address] [place], [place] [address]    Report Status 04/12/2023 FINAL  Final  Culture, blood (routine x 2)     Status: Abnormal   Collection Time: 04/07/23  3:40 AM   Specimen: BLOOD  Result Value Ref Range Status   Specimen Description   Final    BLOOD BLOOD RIGHT FOREARM Performed at [facility], [address], [place], [place]  [address]    Special Requests   Final    BOTTLES DRAWN AEROBIC AND ANAEROBIC Blood Culture adequate volume Performed at [facility], [address], , [place] [address]    Culture  Setup Time   Final    GRAM POSITIVE COCCI ANAEROBIC BOTTLE ONLY CRITICAL RESULT CALLED TO, READ BACK BY AND VERIFIED WITH: EMILY STEINBOCK @ 2104 04/07/23 LFD  Performed at [facility], [address], [place], [place] [address]    Culture STREPTOCOCCUS ANGINOSIS (A)  Final   Report Status 04/09/2023 FINAL  Final   Organism ID, Bacteria STREPTOCOCCUS ANGINOSIS  Final      Susceptibility   Streptococcus anginosis - MIC*    PENICILLIN 0.5 INTERMEDIATE Intermediate     CEFTRIAXONE <=0.12 SENSITIVE Sensitive     ERYTHROMYCIN <=0.12 SENSITIVE Sensitive     VANCOMYCIN <=0.12 SENSITIVE Sensitive     * STREPTOCOCCUS ANGINOSIS  Blood Culture ID Panel (Reflexed)     Status: Abnormal   Collection Time: 04/07/23  3:40 AM  Result Value Ref Range Status   Enterococcus faecalis NOT DETECTED NOT DETECTED Final   Enterococcus Faecium NOT DETECTED NOT DETECTED Final   Listeria monocytogenes NOT DETECTED NOT DETECTED Final   Staphylococcus species NOT DETECTED NOT DETECTED Final   Staphylococcus aureus (BCID) NOT DETECTED NOT DETECTED Final   Staphylococcus epidermidis NOT DETECTED NOT DETECTED Final   Staphylococcus lugdunensis NOT DETECTED NOT DETECTED Final   Streptococcus species DETECTED (A) NOT DETECTED Final    Comment: Not Enterococcus species, Streptococcus agalactiae, Streptococcus pyogenes, or Streptococcus pneumoniae. EMILY STEINBOCK @ 2104 04/07/23 LFD    Streptococcus agalactiae NOT DETECTED NOT DETECTED Final   Streptococcus pneumoniae NOT DETECTED NOT DETECTED Final   Streptococcus pyogenes NOT DETECTED NOT DETECTED Final   A.calcoaceticus-baumannii NOT DETECTED NOT DETECTED Final   Bacteroides fragilis NOT DETECTED NOT DETECTED Final   Enterobacterales NOT DETECTED NOT DETECTED  Final   Enterobacter cloacae complex NOT DETECTED NOT DETECTED Final   Escherichia coli NOT DETECTED NOT DETECTED Final   Klebsiella aerogenes NOT DETECTED NOT DETECTED Final   Klebsiella oxytoca NOT DETECTED NOT DETECTED Final   Klebsiella pneumoniae NOT DETECTED NOT DETECTED Final   Proteus species NOT DETECTED NOT DETECTED Final   Salmonella species NOT DETECTED NOT DETECTED Final   Serratia marcescens NOT DETECTED NOT DETECTED Final   Haemophilus influenzae NOT DETECTED NOT  DETECTED Final   Neisseria meningitidis NOT DETECTED NOT DETECTED Final   Pseudomonas aeruginosa NOT DETECTED NOT DETECTED Final   Stenotrophomonas maltophilia NOT DETECTED NOT DETECTED Final   Candida albicans NOT DETECTED NOT DETECTED Final   Candida auris NOT DETECTED NOT DETECTED Final   Candida glabrata NOT DETECTED NOT DETECTED Final   Candida krusei NOT DETECTED NOT DETECTED Final   Candida parapsilosis NOT DETECTED NOT DETECTED Final   Candida tropicalis NOT DETECTED NOT DETECTED Final   Cryptococcus neoformans/gattii NOT DETECTED NOT DETECTED Final    Comment: Performed at [facility], [address], [place], [place] [address]  Urine Culture     Status: Abnormal   Collection Time: 04/07/23  3:53 AM   Specimen: In/Out Cath Urine  Result Value Ref Range Status   Specimen Description   Final    IN/OUT CATH URINE Performed at [facility], [address] [place], [place] [address]    Special Requests   Final    NONE Performed at [facility], [address] [place], [place] [address]    Culture (A)  Final    3,000 COLONIES/mL ENTEROCOCCUS FAECALIS >=100,000 COLONIES/mL VIRIDANS STREPTOCOCCUS    Report Status 04/10/2023 FINAL  Final   Organism ID, Bacteria ENTEROCOCCUS FAECALIS (A)  Final      Susceptibility   Enterococcus faecalis - MIC*    AMPICILLIN <=2 SENSITIVE Sensitive     VANCOMYCIN 1 SENSITIVE Sensitive     NITROFURANTOIN <=16 SENSITIVE Sensitive     *  3,000 COLONIES/mL ENTEROCOCCUS FAECALIS  Resp panel by RT-PCR (RSV, Flu A&B, Covid) Anterior Nasal Swab     Status: None   Collection Time: 04/07/23  3:53 AM   Specimen: Anterior Nasal Swab  Result Value Ref Range Status   SARS Coronavirus 2 by RT PCR NEGATIVE NEGATIVE Final    Comment: (NOTE) SARS-CoV-2 target nucleic acids are NOT DETECTED.  The SARS-CoV-2 RNA is generally detectable in upper respiratory specimens during the acute phase of infection. The lowest concentration of SARS-CoV-2 viral copies this assay can detect is 138 copies/mL. A negative result does not preclude SARS-Cov-2 infection and should not be used as the sole basis for treatment or other patient management decisions. A negative result may occur with  improper specimen collection/handling, submission of specimen other than nasopharyngeal swab, presence of viral mutation(s) within the areas targeted by this assay, and inadequate number of viral copies(<138 copies/mL). A negative result must be combined with clinical observations, patient history, and epidemiological information. The expected result is Negative.  Fact Sheet for Patients:  BloggerCourse.com  Fact Sheet for Healthcare Providers:  SeriousBroker.it  This test is no t yet approved or cleared by the [place] FDA and  has been authorized for detection and/or diagnosis of SARS-CoV-2 by FDA under an Emergency Use Authorization (EUA). This EUA will remain  in effect (meaning this test can be used) for the duration of the COVID-19 declaration under Section 564(b)(1) of the Act, 21 U.S.C.section 360bbb-3(b)(1), unless the authorization is terminated  or revoked sooner.       Influenza A by PCR NEGATIVE NEGATIVE Final   Influenza B by PCR NEGATIVE NEGATIVE Final    Comment: (NOTE) The Xpert Xpress SARS-CoV-2/FLU/RSV plus assay is intended as an aid in the diagnosis of influenza from Nasopharyngeal  swab specimens and should not be used as a sole basis for treatment. Nasal washings and aspirates are unacceptable for Xpert Xpress SARS-CoV-2/FLU/RSV testing.  Fact Sheet for Patients:  BloggerCourse.com  Fact Sheet for Healthcare Providers: SeriousBroker.it  This test is not yet approved or cleared by the [place] FDA and has been authorized for detection and/or diagnosis of SARS-CoV-2 by FDA under an Emergency Use Authorization (EUA). This EUA will remain in effect (meaning this test can be used) for the duration of the COVID-19 declaration under Section 564(b)(1) of the Act, 21 U.S.C. section 360bbb-3(b)(1), unless the authorization is terminated or revoked.     Resp Syncytial Virus by PCR NEGATIVE NEGATIVE Final    Comment: (NOTE) Fact Sheet for Patients: BloggerCourse.com  Fact Sheet for Healthcare Providers: SeriousBroker.it  This test is not yet approved or cleared by the [place] FDA and has been authorized for detection and/or diagnosis of SARS-CoV-2 by FDA under an Emergency Use Authorization (EUA). This EUA will remain in effect (meaning this test can be used) for the duration of the COVID-19 declaration under Section 564(b)(1) of the Act, 21 U.S.C. section 360bbb-3(b)(1), unless the authorization is terminated or revoked.  Performed at [facility], [address] [place], [place] [address]   Expectorated Sputum Assessment w Gram Stain, Rflx to Resp Cult     Status: None   Collection Time: 04/09/23 12:24 PM   Specimen: Sputum  Result Value Ref Range Status   Specimen Description SPUTUM  Final   Special Requests NONE  Final   Sputum evaluation   Final    Sputum specimen not acceptable for testing.  Please recollect.   C/JOAN WILLIS AT 1329 04/09/23.PMF Performed at [facility], [address] [place], [place] [address]    Report  Status 04/09/2023 FINAL  Final  Culture, blood (Routine X 2) w Reflex to ID Panel     Status: None (Preliminary result)   Collection Time: 04/10/23  6:10 PM   Specimen: BLOOD  Result Value Ref Range Status   Specimen Description BLOOD RIGHT ANTECUBITAL  Final   Special Requests   Final    BOTTLES DRAWN AEROBIC AND ANAEROBIC Blood Culture adequate volume   Culture   Final    NO GROWTH 2 DAYS Performed at [facility], [address] [place], [place] [address]    Report Status PENDING  Incomplete  Culture, blood (Routine X 2) w Reflex to ID Panel     Status: None (Preliminary result)   Collection Time: 04/10/23  6:10 PM   Specimen: BLOOD RIGHT HAND  Result Value Ref Range Status   Specimen Description BLOOD RIGHT HAND  Final   Special Requests   Final    BOTTLES DRAWN AEROBIC AND ANAEROBIC Blood Culture adequate volume   Culture   Final    NO GROWTH 2 DAYS Performed at [facility], [address] [place], [place] [address]    Report Status PENDING  Incomplete    Coagulation Studies: No results for input(s): "LABPROT", "INR" in the last 72 hours.  Urinalysis: No results for input(s): "COLORURINE", "LABSPEC", "PHURINE", "GLUCOSEU", "HGBUR", "BILIRUBINUR", "KETONESUR", "PROTEINUR", "UROBILINOGEN", "NITRITE", "LEUKOCYTESUR" in the last 72 hours.  Invalid input(s): "APPERANCEUR"     Imaging: No results found.   Medications:     ceFAZolin (ANCEF) IV 2 g (04/12/23 1108)    (feeding supplement) PROSource Plus  30 mL Oral BID BM   brimonidine  1 drop Both Eyes BID   carvedilol  3.125 mg Oral BID   feeding supplement  237 mL Oral TID BM   heparin  5,000 Units Subcutaneous Q12H   multivitamin  1 tablet Oral  Daily   nystatin  5 mL Oral QID   pantoprazole  40 mg Oral Daily   sertraline  25 mg Oral Daily   torsemide  100 mg Oral Daily   acetaminophen, ALPRAZolam, heparin, lidocaine-prilocaine, ondansetron **OR** ondansetron (ZOFRAN) IV,  pentafluoroprop-tetrafluoroeth, traZODone  Assessment/ Plan:  Ms. ZURISADAY BULT is a 89 y.o.  female wth end stage renal disease on hemodialysis, hypertension, congestive heart failure, depression, GERD who presents to [facility] with 04/07/2023 with Hypokalemia [E87.6] Pericardial effusion [I31.39] Pneumonia [J18.9] Failure to thrive in adult [R62.7] Urinary tract infection without hematuria, site unspecified [N39.0] Community acquired pneumonia, unspecified laterality [J18.9]  [facility] Left AVF 43kg  End Stage Renal Disease: Receiving dialysis today, UF 0.5-1L as tolerated. Next treatment scheduled for Friday.   Hypertension with chronic kidney disease: Continue torsemide.  Blood pressure 149/69 during dialysis   Anemia with chronic kidney disease: mircera as outpatient. Hemoglobin 11.4, at goal  Secondary Hyperparathyroidism: currently holding phosphate binders. Gets parsabiv outpatient.  Calcium and phosphorus within acceptable range.    LOS: 5 Seini Lannom 12/11/202411:32 AM

## 2023-04-12 NOTE — Progress Notes (Signed)
Date of Admission:  04/07/2023      ID: Lori Stanley is a 89 y.o. female Principal Problem:   UTI (urinary tract infection) Active Problems:   Diabetes (HCC)   Anxiety   Essential hypertension   ESRD (end stage renal disease) (HCC)   Chronic diastolic CHF (congestive heart failure) (HCC)   Dysphagia    Subjective: Feeling better Sore throat and throat pain much better  Medications:   (feeding supplement) PROSource Plus  30 mL Oral BID BM   brimonidine  1 drop Both Eyes BID   carvedilol  3.125 mg Oral BID   feeding supplement  237 mL Oral TID BM   heparin  5,000 Units Subcutaneous Q12H   multivitamin  1 tablet Oral Daily   nystatin  5 mL Oral QID   pantoprazole  40 mg Oral Daily   sertraline  25 mg Oral Daily   torsemide  100 mg Oral Daily    Objective: Vital signs in last 24 hours: Patient Vitals for the past 24 hrs:  BP Temp Temp src Pulse Resp SpO2 Weight  04/12/23 1541 (!) 124/54 98.1 F (36.7 C) Oral 72 18 100 % --  04/12/23 1244 (!) 151/62 98.3 F (36.8 C) Oral 92 18 98 % --  04/12/23 1201 -- -- -- -- -- -- 39.8 kg  04/12/23 1149 (!) 166/66 97.8 F (36.6 C) Oral 76 (!) 29 99 % --  04/12/23 1130 139/61 -- -- 81 (!) 23 98 % --  04/12/23 1100 (!) 144/64 -- -- 74 (!) 33 98 % --  04/12/23 1030 (!) 149/69 -- -- 80 (!) 28 98 % --  04/12/23 1000 (!) 151/63 -- -- 72 (!) 33 97 % --  04/12/23 0930 (!) 149/63 -- -- 79 (!) 32 95 % --  04/12/23 0900 (!) 161/64 -- -- 72 (!) 26 96 % --  04/12/23 0830 (!) 152/63 -- -- (!) 53 (!) 29 94 % --  04/12/23 0807 (!) 179/66 -- -- 75 (!) 24 92 % --  04/12/23 0755 (!) 177/67 98.1 F (36.7 C) Oral 78 (!) 25 97 % 40.7 kg  04/11/23 2320 (!) 150/68 99 F (37.2 C) Oral 76 20 96 % --      PHYSICAL EXAM:  General: Alert, cooperative, no distress, appears stated age.  Lungs: Clear to auscultation bilaterally. No Wheezing or Rhonchi. No rales. Heart: systolic murmur Abdomen: Soft, non-tender,not distended. Bowel sounds normal.  No masses Extremities: atraumatic, no cyanosis. No edema. No clubbing Skin: No rashes or lesions. Or bruising Lymph: Cervical, supraclavicular normal. Neurologic: Grossly non-focal  Lab Results    Latest Ref Rng & Units 04/12/2023    8:03 AM 04/10/2023    2:10 AM 04/09/2023    3:13 AM  CBC  WBC 4.0 - 10.5 K/uL 7.6  9.4  9.5   Hemoglobin 12.0 - 15.0 g/dL 65.7  84.6  96.2   Hematocrit 36.0 - 46.0 % 37.0  36.0  36.0   Platelets 150 - 400 K/uL 252  213  197        Latest Ref Rng & Units 04/12/2023    8:03 AM 04/11/2023    5:03 AM 04/10/2023    2:10 AM  CMP  Glucose 70 - 99 mg/dL 83  77  952   BUN 8 - 23 mg/dL 28  21  22    Creatinine 0.44 - 1.00 mg/dL 8.41  3.24  4.01   Sodium 135 - 145 mmol/L 140  137  136   Potassium 3.5 - 5.1 mmol/L 4.2  3.4  3.8   Chloride 98 - 111 mmol/L 103  99  99   CO2 22 - 32 mmol/L 29  29  27    Calcium 8.9 - 10.3 mg/dL 9.3  9.1  9.1       Microbiology: BC- strep anginosus    Assessment/Plan: Streptococcus anginosus bacteremia and presence of same orgaism in urine culture This is not a common organism to cause UTI, so this could be a spillover from bacteremia ora true UTI rt pyelonephritis Need to r/o endocaridits as on dialysis and also systolic murmur but flexion deformity of cervical spine/neck precludes safe TEE Will get 2 d echo . May need a long course of antibiotic on cefazolin which can be given during dialysis   Throat pain/sore throat- no evidence of sialadenitis or lymphadenitis Seen by ENT - no obvious pathology   Weight loss CT scan shows thickened gastric wall- ? Gastritis ?? Linitis plastica   ESRD on dialysis   Pericardial effusion seen on CT Oral yeast infection- nystatin swish and swallow   Discussed the management with the patient

## 2023-04-12 NOTE — Progress Notes (Signed)
PROGRESS NOTE   Lori Stanley  JXB:[id number] DOB: 01-Jul-1931 DOA: 04/07/2023 PCP: [name], MD   Date of Service: the patient was seen and examined on 04/12/2023  Brief Narrative:  89 yo female with the past medical history of heart failure, hypertension, ESRD, and gastritis who presented with difficulty swallowing for 2 to 3 weeks prior to hospitalization with progressive weight loss.  Due to concerns over failure to thrive and volume depletion the hospitalist group was then called to assess the patient for admission to the hospital.  During a thorough evaluation patient was identified to have urinalysis suggestive of urinary tract infection.  Urine culture on 12/5 grew out streptococcus anginosus with additional blood culture drawn on 12/6 growing out same organism.  Of note, urine culture performed on 12/6 grew out Enterococcus faecalis however this was felt to be contaminant.  Patient was treated with intravenous ceftriaxone and infectious disease was consulted.  Infectious disease recommended evaluating for infective endocarditis in the setting of systolic murmur on cardiac examination although due to cervical spine disease a TEE could not be performed.  They recommended a TTE instead.  Concerning the patient's difficulty swallowing Dr. [name] with ENT was consulted and performed a transnasal flexible laryngoscopy revealing some mild edema on laryngoscopy with a short course of systemic steroids recommended.  Patient received hemodialysis per her usual Monday, Wednesday, Friday dialysis treatment schedule while hospitalized.  Throughout this complicated hospitalization physical therapy evaluated the patient and patient felt the patient would benefit from skilled services in a skilled nursing facility.  Plan is for patient to received with skilled nurse facility placement after her workup is complete.   Assessment & Plan Streptococcal bacteremia Strep anginosus  growing out on both urine and blood cultures  Currently treated with intravenous ceftriaxone  Enterococcus faecalis growing out of urine on 12/6 felt to be a contaminant.   Infectious disease following, recommending TTE to evaluate for infective endocarditis considering patient cannot have a TEE due to cervical disease. Repeat surveillance blood cultures on 12/9 are without growth. UTI (urinary tract infection) See [facility] plan above ESRD (end stage renal disease) [facility]) Nephrology following, their assistance is appreciated  Patient receiving hemodialysis per her schedule of M-W-F.   Dysphagia Continue pantoprazole for antiacid therapy Per ENT recommendations with a short course of prednisone for laryngeal edema seen on laryngoscopy during this hospitalization  Essential hypertension Relaxed blood pressure targets due to advanced age  continue blood pressure control with carvedilol.  As intravenous hydralazine for markedly elevated blood pressure. Type 2 diabetes mellitus without complication, without long-term current use of insulin (HCC) Longstanding uncomplicated diabetes mellitus, supportive care  Last reported hemoglobin A1c of 5.0% in 2023 Chronic diastolic CHF (congestive heart failure) (HCC) No clinical evidence of cardiogenic volume overload Strict input and output monitoring Daily weights Low-sodium diet  Anxiety Continue sertraline and trazodone.  Added as needed alprazolam for anxiety.      Subjective:  Patient currently has no complaints.  Patient states that her generalized weakness is improving.  Physical Exam:  Vitals:   04/12/23 1149 04/12/23 1201 04/12/23 1244 04/12/23 1541  BP: (!) 166/66  (!) 151/62 (!) 124/54  Pulse: 76  92 72  Resp: (!) 29  18 18   Temp: 97.8 F (36.6 C)  98.3 F (36.8 C) 98.1 F (36.7 C)  TempSrc: Oral  Oral Oral  SpO2: 99%  98% 100%  Weight:  39.8 kg    Height:  Constitutional: Awake alert and oriented x3, no  associated distress.   Skin: no rashes, no lesions, good skin turgor noted. Eyes: Pupils are equally reactive to light.  No evidence of scleral icterus or conjunctival pallor.  ENMT: Moist mucous membranes noted.  Posterior pharynx clear of any exudate or lesions.   Respiratory: clear to auscultation bilaterally, no wheezing, no crackles. Normal respiratory effort. No accessory muscle use.  Cardiovascular: Notable 3 out of 6 systolic murmur.  No extremity edema. 2+ pedal pulses. No carotid bruits.  Abdomen: Abdomen is soft and nontender.  No evidence of intra-abdominal masses.  Positive bowel sounds noted in all quadrants.   Musculoskeletal: No joint deformity upper and lower extremities. Good ROM, no contractures. Normal muscle tone.    Data Reviewed:  I have personally reviewed and interpreted labs, imaging.  Significant findings are   CBC: Recent Labs  Lab 04/06/23 1654 04/08/23 0313 04/09/23 0313 04/10/23 0210 04/12/23 0803  WBC 8.0 12.2* 9.5 9.4 7.6  NEUTROABS 6.7  --  8.3*  --   --   HGB 12.3 11.6* 11.6* 11.3* 11.4*  HCT 38.9 37.0 36.0 36.0 37.0  MCV 89.4 87.9 86.3 88.2 88.9  PLT 213 212 197 213 252   Basic Metabolic Panel: Recent Labs  Lab 04/06/23 1653 04/06/23 1654 04/08/23 0313 04/09/23 0313 04/10/23 0210 04/11/23 0503 04/12/23 0803  NA  --    < > 143 137 136 137 140  K  --    < > 3.1* 3.3* 3.8 3.4* 4.2  CL  --    < > 108 100 99 99 103  CO2  --    < > 27 29 27 29 29   GLUCOSE  --    < > 102* 131* 186* 77 83  BUN  --    < > 29* 15 22 21  28*  CREATININE  --    < > 2.52* 1.60* 2.30* 2.09* 2.55*  CALCIUM  --    < > 8.7* 8.8* 9.1 9.1 9.3  MG 1.8  --  2.1  --   --   --  1.9  PHOS 3.6  --  3.4  --  4.2  --  3.6   < > = values in this interval not displayed.   GFR: Estimated Creatinine Clearance: 9.4 mL/min (A) (by C-G formula based on SCr of 2.55 mg/dL (H)). Liver Function Tests: Recent Labs  Lab 04/06/23 1159 04/08/23 0313 04/10/23 0210  AST 23 18  --    ALT 17 15  --   ALKPHOS 275* 194*  --   BILITOT 0.3 0.7  --   PROT 6.6 5.9*  --   ALBUMIN 3.8 2.9* 2.6*    Code Status:  Full code.  Code status decision has been confirmed with: patient    Severity of Illness:  The appropriate patient status for this patient is INPATIENT. Inpatient status is judged to be reasonable and necessary in order to provide the required intensity of service to ensure the patient's safety. The patient's presenting symptoms, physical exam findings, and initial radiographic and laboratory data in the context of their chronic comorbidities is felt to place them at high risk for further clinical deterioration. Furthermore, it is not anticipated that the patient will be medically stable for discharge from the hospital within 2 midnights of admission.   * I certify that at the point of admission it is my clinical judgment that the patient will require inpatient hospital care spanning beyond 2 midnights from  the point of admission due to high intensity of service, high risk for further deterioration and high frequency of surveillance required.*  Time spent:  51 minutes  Author:  Marinda Elk MD  04/12/2023 8:36 PM

## 2023-04-12 NOTE — Assessment & Plan Note (Signed)
Strep anginosus growing out on both urine and blood cultures  Currently treated with intravenous ceftriaxone  Enterococcus faecalis growing out of urine on 12/6 felt to be a contaminant.   Infectious disease following, recommending TTE to evaluate for infective endocarditis considering patient cannot have a TEE due to cervical disease. Repeat surveillance blood cultures on 12/9 are without growth.

## 2023-04-12 NOTE — Progress Notes (Signed)
PT Cancellation Note  Patient Details Name: Lori Stanley MRN: [id number] DOB: 1932-01-28   Cancelled Treatment:    Reason Eval/Treat Not Completed: Patient at procedure or test/unavailable Patient at HD. Will re-attempt when patient is available.   [name] 04/12/2023, 9:58 AM

## 2023-04-12 NOTE — Consult Note (Signed)
PHARMACY CONSULT NOTE - ELECTROLYTES  Pharmacy Consult for Electrolyte Monitoring and Replacement   Recent Labs: Height: 5\' 2"  (157.5 cm) Weight: 40.7 kg (89 lb 11.6 oz) IBW/kg (Calculated) : 50.1 Estimated Creatinine Clearance: 9.6 mL/min (A) (by C-G formula based on SCr of 2.55 mg/dL (H)).  Potassium (mmol/L)  Date Value  04/12/2023 4.2  05/15/2013 4.0   Magnesium (mg/dL)  Date Value  32/44/0102 1.9   Calcium (mg/dL)  Date Value  72/53/6644 9.3   Calcium, Total (mg/dL)  Date Value  03/47/4259 8.0 (L)   Albumin (g/dL)  Date Value  56/38/7564 2.6 (L)  04/06/2023 3.8  05/11/2013 3.6   Phosphorus (mg/dL)  Date Value  33/29/5188 3.6   Sodium (mmol/L)  Date Value  04/12/2023 140  04/06/2023 141  05/15/2013 132 (L)   Assessment  Lori Stanley is a 89 y.o. female presenting with failure to thrive. Pharmacy has been consulted to monitor and replace electrolytes. ESRD on hemodialysis    Pertinent medications: torsemide 100 mg po daily  Goal of Therapy: Electrolytes WNL  Plan:  No supplementation needed Check BMP with AM labs  Thank you for allowing pharmacy to be a part of this patient's care.  Merryl Hacker, PharmD 04/12/2023 8:52 AM

## 2023-04-12 NOTE — Progress Notes (Signed)
PT Cancellation Note  Patient Details Name: ADILEE CARDINAL MRN: [id number] DOB: 21-Jan-1932   Cancelled Treatment:    Reason Eval/Treat Not Completed: Fatigue/lethargy limiting ability to participate Patient declined PT at this time states she is worn out and wants to rest. Will re-attempt PT tomorrow.   Kennah Hehr 04/12/2023, 4:00 PM

## 2023-04-12 NOTE — Plan of Care (Signed)
Patient ID: Lori Stanley, female   DOB: 1931-07-02, 89 y.o.   MRN: [id number]  Problem: Education: Goal: Knowledge of General Education information will improve Description: Including pain rating scale, medication(s)/side effects and non-pharmacologic comfort measures Outcome: Progressing   Problem: Health Behavior/Discharge Planning: Goal: Ability to manage health-related needs will improve Outcome: Progressing   Problem: Clinical Measurements: Goal: Ability to maintain clinical measurements within normal limits will improve Outcome: Progressing Goal: Will remain free from infection Outcome: Progressing Goal: Diagnostic test results will improve Outcome: Progressing Goal: Respiratory complications will improve Outcome: Progressing Goal: Cardiovascular complication will be avoided Outcome: Progressing   Problem: Activity: Goal: Risk for activity intolerance will decrease Outcome: Progressing   Problem: Nutrition: Goal: Adequate nutrition will be maintained Outcome: Progressing   Problem: Coping: Goal: Level of anxiety will decrease Outcome: Progressing   Problem: Elimination: Goal: Will not experience complications related to bowel motility Outcome: Progressing Goal: Will not experience complications related to urinary retention Outcome: Progressing   Problem: Pain Management: Goal: General experience of comfort will improve Outcome: Progressing   Problem: Safety: Goal: Ability to remain free from injury will improve Outcome: Progressing   Problem: Skin Integrity: Goal: Risk for impaired skin integrity will decrease Outcome: Progressing   Problem: Activity: Goal: Ability to tolerate increased activity will improve Outcome: Progressing   Problem: Clinical Measurements: Goal: Ability to maintain a body temperature in the normal range will improve Outcome: Progressing   Problem: Respiratory: Goal: Ability to maintain adequate ventilation will  improve Outcome: Progressing Goal: Ability to maintain a clear airway will improve Outcome: Progressing    [name], RN

## 2023-04-13 ENCOUNTER — Inpatient Hospital Stay: Admit: 2023-04-13 | Payer: 59

## 2023-04-13 ENCOUNTER — Inpatient Hospital Stay
Admit: 2023-04-13 | Discharge: 2023-04-13 | Disposition: A | Discharge status: Home / Self Care | Payer: 59 | Attending: Infectious Diseases | Admitting: Infectious Diseases

## 2023-04-13 ENCOUNTER — Ambulatory Visit: Payer: 59 | Admitting: Family Medicine

## 2023-04-13 DIAGNOSIS — N390 Urinary tract infection, site not specified: Secondary | ICD-10-CM | POA: Diagnosis not present

## 2023-04-13 DIAGNOSIS — R1310 Dysphagia, unspecified: Secondary | ICD-10-CM | POA: Diagnosis not present

## 2023-04-13 DIAGNOSIS — R7881 Bacteremia: Secondary | ICD-10-CM | POA: Diagnosis not present

## 2023-04-13 DIAGNOSIS — N186 End stage renal disease: Secondary | ICD-10-CM | POA: Diagnosis not present

## 2023-04-13 LAB — CBC WITH DIFFERENTIAL/PLATELET
Abs Immature Granulocytes: 0.02 10*3/uL (ref 0.00–0.07)
Basophils Absolute: 0 10*3/uL (ref 0.0–0.1)
Basophils Relative: 0 %
Eosinophils Absolute: 0.1 10*3/uL (ref 0.0–0.5)
Eosinophils Relative: 1 %
HCT: 37.7 % (ref 36.0–46.0)
Hemoglobin: 11.9 g/dL — ABNORMAL LOW (ref 12.0–15.0)
Immature Granulocytes: 0 %
Lymphocytes Relative: 13 %
Lymphs Abs: 0.8 10*3/uL (ref 0.7–4.0)
MCH: 27.7 pg (ref 26.0–34.0)
MCHC: 31.6 g/dL (ref 30.0–36.0)
MCV: 87.7 fL (ref 80.0–100.0)
Monocytes Absolute: 0.4 10*3/uL (ref 0.1–1.0)
Monocytes Relative: 6 %
Neutro Abs: 5 10*3/uL (ref 1.7–7.7)
Neutrophils Relative %: 80 %
Platelets: 230 10*3/uL (ref 150–400)
RBC: 4.3 MIL/uL (ref 3.87–5.11)
RDW: 14.7 % (ref 11.5–15.5)
WBC: 6.3 10*3/uL (ref 4.0–10.5)
nRBC: 0 % (ref 0.0–0.2)

## 2023-04-13 LAB — COMPREHENSIVE METABOLIC PANEL
ALT: 10 U/L (ref 0–44)
AST: 16 U/L (ref 15–41)
Albumin: 2.7 g/dL — ABNORMAL LOW (ref 3.5–5.0)
Alkaline Phosphatase: 161 U/L — ABNORMAL HIGH (ref 38–126)
Anion gap: 8 (ref 5–15)
BUN: 13 mg/dL (ref 8–23)
CO2: 30 mmol/L (ref 22–32)
Calcium: 9.3 mg/dL (ref 8.9–10.3)
Chloride: 98 mmol/L (ref 98–111)
Creatinine, Ser: 1.88 mg/dL — ABNORMAL HIGH (ref 0.44–1.00)
GFR, Estimated: 25 mL/min — ABNORMAL LOW (ref >60)
Glucose, Bld: 70 mg/dL (ref 70–99)
Potassium: 4 mmol/L (ref 3.5–5.1)
Sodium: 136 mmol/L (ref 135–145)
Total Bilirubin: 0.8 mg/dL (ref <1.2)
Total Protein: 5.8 g/dL — ABNORMAL LOW (ref 6.5–8.1)

## 2023-04-13 LAB — ECHOCARDIOGRAM COMPLETE
AR max vel: 1.07 cm2
AV Area VTI: 1.13 cm2
AV Area mean vel: 1.08 cm2
AV Mean grad: 24.3 mm[Hg]
AV Peak grad: 38.5 mm[Hg]
Ao pk vel: 3.1 m/s
Area-P 1/2: 2.5 cm2
Height: 62 in
MV VTI: 2.19 cm2
S' Lateral: 2.3 cm
Weight: 1403.89 [oz_av]

## 2023-04-13 LAB — MAGNESIUM: Magnesium: 2.2 mg/dL (ref 1.7–2.4)

## 2023-04-13 NOTE — Treatment Plan (Addendum)
Diagnosis: Streptococcus anginosus bacteremia Urine culture streptococcus anginosus TEE cannot be done ESRD on dialysis    Allergies  Allergen Reactions   Antihistamines, Chlorpheniramine-Type Other (See Comments)    Unknown reaction.   Other Other (See Comments)   Paxil [Paroxetine]     "makes her feel funny" not in a good way   Codeine Rash and Other (See Comments)    Mouth sore    OPAT Orders Discharge antibiotics: Cefazolin 2 grams during each hemodialysis day  three times a week-M-W-F For 4 weeks total End Date: 05/05/23   [facility] Care Per Protocol:  Labs weekly while on IV antibiotics: _X_ CBC with differential  _X_ CMP    Fax weekly lab results  promptly to [phone]  Clinic Follow Up Appt: 04/27/23 at 11.45 am   Call [phone] with any questions or critical value

## 2023-04-13 NOTE — Progress Notes (Signed)
Brief Nutrition Follow-Up Note  Received phone call from pt's niece, who expressed concern over pt's poor oral intake. Pt continues to struggle with chopped foods and states "she can't eat it so it just sits there wasted". Niece is inquiring if diet can be further downgraded to support ease of intake. RD informed niece that RD discussed with her during previous visit, however, pt declined at this time due to having access to certain menu items which she liked. Niece discussed recommendation with her and RD heard pt verbally agree to diet downgrade. Diet downgraded to dysphagia for ease of intake.   RD will continue to follow throughout hospitalization and make further changes to care plan as necessary.   INTERVENTION:    -Continue renal MVI daily -Continue Ensure Enlive po TID, each supplement provides 350 kcal and 20 grams of protein -Continue Magic cup TID with meals, each supplement provides 290 kcal and 9 grams of protein  -Continue 30 ml Prosource Plus BID, each supplement provides 100 kcals and 15 grams protein -Downgrade diet to dysphagia 1 diet for ease of intake  Levada Schilling, RD, LDN, CDCES Registered Dietitian III Certified Diabetes Care and Education Specialist If unable to reach this RD, please use "RD Inpatient" group chat on secure chat between hours of 8am-4 pm daily

## 2023-04-13 NOTE — Progress Notes (Addendum)
Physical Therapy Treatment Patient Details Name: Lori Stanley MRN: [id number] DOB: Oct 22, 1931 Today's Date: 04/13/2023   History of Present Illness 89 y.o. female with PMHx of HFpEF, ESRD on hemodialysis Monday Wednesday Friday, GERD, hypertension presenting with failure to thrive, pneumonia, gastritis, Cholelithiasis and Colonic diverticulosis and UTI.    PT Comments  Pt resting in recliner upon PT arrival; pleasant and agreeable to therapy.  No c/o pain during session.  Pt min assist with transfers and CGA to min assist to ambulate 30 feet x2 with RW use (intermittent assist to steady required although improved balance noted 2nd trial of ambulation).  Pt's SpO2 sats 94% or greater on 2 L O2 via nasal cannula during sessions activities.  Will continue to focus on strengthening, balance, and progressive functional mobility during hospitalization.    If plan is discharge home, recommend the following: Assistance with cooking/housework;Direct supervision/assist for financial management;Assist for transportation;Direct supervision/assist for medications management;Help with stairs or ramp for entrance;A little help with walking and/or transfers;A little help with bathing/dressing/bathroom   Can travel by private Theme park manager (measurements PT);Wheelchair cushion (measurements PT);Rolling walker (2 wheels);BSC/3in1    Recommendations for Other Services       Precautions / Restrictions Precautions Precautions: Fall Precaution Comments: L UE fistula Restrictions Weight Bearing Restrictions Per Provider Order: No     Mobility  Bed Mobility               General bed mobility comments: Deferred (pt in recliner beginning/end of session)    Transfers Overall transfer level: Needs assistance Equipment used: Rolling walker (2 wheels) Transfers: Sit to/from Stand Sit to Stand: Min assist           General transfer comment: min assist  to stand from recliner x2 trials; vc's for UE/LE placement    Ambulation/Gait Ambulation/Gait assistance: Min assist, Contact guard assist Gait Distance (Feet):  (30 feet x2) Assistive device: Rolling walker (2 wheels) Gait Pattern/deviations: Step-through pattern, Trunk flexed, Decreased step length - right, Decreased step length - left Gait velocity: decreased     General Gait Details: intermittent unsteadiness requiring min assist for balance (although improved 2nd trial of ambulation)   Stairs             Wheelchair Mobility     Tilt Bed    Modified Rankin (Stroke Patients Only)       Balance Overall balance assessment: Needs assistance Sitting-balance support: No upper extremity supported, Feet supported Sitting balance-Leahy Scale: Good Sitting balance - Comments: steady reaching within BOS   Standing balance support: Bilateral upper extremity supported Standing balance-Leahy Scale: Poor Standing balance comment: intermittent assist to steady with ambulation                            Cognition Arousal: Alert Behavior During Therapy: WFL for tasks assessed/performed Overall Cognitive Status: History of cognitive impairments - at baseline                                 General Comments: Oriented to self, location, and general situation        Exercises      General Comments  Pt agreeable to PT session.      Pertinent Vitals/Pain Pain Assessment Pain Assessment: No/denies pain Pain Intervention(s): Limited activity within patient's tolerance, Monitored during session, Repositioned HR  WFL during sessions activities.    Home Living                          Prior Function            PT Goals (current goals can now be found in the care plan section) Acute Rehab PT Goals Patient Stated Goal: to go home PT Goal Formulation: With patient Time For Goal Achievement: 04/22/23 Potential to Achieve Goals:  Good Progress towards PT goals: Progressing toward goals    Frequency    Min 1X/week      PT Plan      Co-evaluation              AM-PAC PT "6 Clicks" Mobility   Outcome Measure  Help needed turning from your back to your side while in a flat bed without using bedrails?: A Little Help needed moving from lying on your back to sitting on the side of a flat bed without using bedrails?: A Little Help needed moving to and from a bed to a chair (including a wheelchair)?: A Little Help needed standing up from a chair using your arms (e.g., wheelchair or bedside chair)?: A Little Help needed to walk in hospital room?: A Little Help needed climbing 3-5 steps with a railing? : A Lot 6 Click Score: 17    End of Session Equipment Utilized During Treatment: Gait belt;Oxygen Activity Tolerance: Patient tolerated treatment well Patient left: in chair;with call bell/phone within reach;with chair alarm set Nurse Communication: Mobility status;Precautions (via whiteboard) PT Visit Diagnosis: Unsteadiness on feet (R26.81);Muscle weakness (generalized) (M62.81)     Time: 1510-1536 PT Time Calculation (min) (ACUTE ONLY): 26 min  Charges:    $Gait Training: 8-22 mins $Therapeutic Activity: 8-22 mins PT General Charges $$ ACUTE PT VISIT: 1 Visit                     Hendricks Limes, PT 04/13/23, 3:54 PM

## 2023-04-13 NOTE — Progress Notes (Signed)
The above named patient is recommended to go to Short Term Rehab for strengthening and gait training for balance.  It is expected that the Short Term Rehab stay will be less than 30 days.  The patient is expected to return home after Rehab.  

## 2023-04-13 NOTE — Assessment & Plan Note (Signed)
Longstanding uncomplicated diabetes mellitus, supportive care  Last reported hemoglobin A1c of 5.0% in 2023

## 2023-04-13 NOTE — Assessment & Plan Note (Signed)
Nephrology following, their assistance is appreciated  Patient receiving hemodialysis per her schedule of M-W-F.

## 2023-04-13 NOTE — Assessment & Plan Note (Signed)
Relaxed blood pressure targets due to advanced age  continue blood pressure control with carvedilol.  As intravenous hydralazine for markedly elevated blood pressure.

## 2023-04-13 NOTE — Assessment & Plan Note (Addendum)
Strep anginosus growing out on both urine and blood cultures  Previously treated with intravenous ceftriaxone however now infectious disease is recommending patient be transition to intravenous Ancef to be administered with dialysis over the course of a total of 4 weeks. Enterococcus faecalis growing out of urine on 12/6 felt to be a contaminant.   Infectious disease following, their input is appreciated  TTE performed today reveals no evidence of infective endocarditis.  Patient unlikely to tolerate TEE due to cervical disease.   Repeat surveillance blood cultures on 12/9 are without growth.

## 2023-04-13 NOTE — Assessment & Plan Note (Addendum)
Continue pantoprazole for antiacid therapy Per ENT recommendations with a short course of prednisone for laryngeal edema seen on laryngoscopy during this hospitalization.  Will likely discontinue at time of discharge.

## 2023-04-13 NOTE — Progress Notes (Signed)
PROGRESS NOTE   Lori Stanley  ZOX:[id number] DOB: 03-Jan-1932 DOA: 04/07/2023 PCP: [name], MD   Date of Service: the patient was seen and examined on 04/13/2023  Brief Narrative:  89 yo female with the past medical history of heart failure, hypertension, ESRD, and gastritis who presented with difficulty swallowing for 2 to 3 weeks prior to hospitalization with progressive weight loss.  Due to concerns over failure to thrive and volume depletion the hospitalist group was then called to assess the patient for admission to the hospital.  During a thorough evaluation patient was identified to have urinalysis suggestive of urinary tract infection.  Urine culture on 12/5 grew out streptococcus anginosus with additional blood culture drawn on 12/6 growing out same organism.  Of note, urine culture performed on 12/6 grew out Enterococcus faecalis however this was felt to be contaminant.  Patient was treated with intravenous ceftriaxone and infectious disease was consulted.  Infectious disease recommended evaluating for infective endocarditis in the setting of systolic murmur on cardiac examination although due to cervical spine disease a TEE could not be performed.  They recommended a TTE instead.  Concerning the patient's difficulty swallowing Dr. [name] with ENT was consulted and performed a transnasal flexible laryngoscopy revealing some mild edema on laryngoscopy with a short course of systemic steroids recommended.  Patient received hemodialysis per her usual Monday, Wednesday, Friday dialysis treatment schedule while hospitalized.  Throughout this complicated hospitalization physical therapy evaluated the patient and patient felt the patient would benefit from skilled services in a skilled nursing facility.  Plan is for patient to received with skilled nurse facility placement after her workup is complete.   Assessment & Plan Streptococcal bacteremia Strep anginosus  growing out on both urine and blood cultures  Previously treated with intravenous ceftriaxone however now infectious disease is recommending patient be transition to intravenous Ancef to be administered with dialysis over the course of a total of 4 weeks. Enterococcus faecalis growing out of urine on 12/6 felt to be a contaminant.   Infectious disease following, their input is appreciated  TTE performed today reveals no evidence of infective endocarditis.  Patient unlikely to tolerate TEE due to cervical disease.   Repeat surveillance blood cultures on 12/9 are without growth. UTI (urinary tract infection) See assessment and plan above ESRD (end stage renal disease) [facility]) Nephrology following, their assistance is appreciated  Patient receiving hemodialysis per her schedule of M-W-F.   Dysphagia Continue pantoprazole for antiacid therapy Per ENT recommendations with a short course of prednisone for laryngeal edema seen on laryngoscopy during this hospitalization.  Will likely discontinue at time of discharge.  Essential hypertension Relaxed blood pressure targets due to advanced age  continue blood pressure control with carvedilol.  As intravenous hydralazine for markedly elevated blood pressure. Type 2 diabetes mellitus without complication, without long-term current use of insulin (HCC) Longstanding uncomplicated diabetes mellitus, supportive care  Last reported hemoglobin A1c of 5.0% in 2023 Chronic diastolic CHF (congestive heart failure) (HCC) No clinical evidence of cardiogenic volume overload Strict input and output monitoring Daily weights Low-sodium diet  Anxiety Continue sertraline and trazodone.  Added as needed alprazolam for anxiety.      Subjective:  Patient currently has no complaints.  Patient states that she feels back to baseline.  Physical Exam:  Vitals:   04/12/23 1244 04/12/23 1541 04/12/23 2301 04/13/23 0810  BP: (!) 151/62 (!) 124/54 132/62 (!) 152/64   Pulse: 92 72 70 76  Resp: 18 18 20  Temp: 98.3 F (36.8 C) 98.1 F (36.7 C) 98.2 F (36.8 C) 98.4 F (36.9 C)  TempSrc: Oral Oral Oral Oral  SpO2: 98% 100% 100% 100%  Weight:      Height:         Constitutional: Awake alert and oriented x3, no associated distress.   Skin: no rashes, no lesions, good skin turgor noted. Eyes: Pupils are equally reactive to light.  No evidence of scleral icterus or conjunctival pallor.  ENMT: Moist mucous membranes noted.  Posterior pharynx clear of any exudate or lesions.   Respiratory: clear to auscultation bilaterally, no wheezing, no crackles. Normal respiratory effort. No accessory muscle use.  Cardiovascular: Notable 3 out of 6 systolic murmur.  No extremity edema. 2+ pedal pulses. No carotid bruits.  Abdomen: Abdomen is soft and nontender.  No evidence of intra-abdominal masses.  Positive bowel sounds noted in all quadrants.   Musculoskeletal: No joint deformity upper and lower extremities. Good ROM, no contractures. Normal muscle tone.    Data Reviewed:  I have personally reviewed and interpreted labs, imaging.  Significant findings are   CBC: Recent Labs  Lab 04/06/23 1654 04/08/23 0313 04/09/23 0313 04/10/23 0210 04/12/23 0803 04/13/23 0424  WBC 8.0 12.2* 9.5 9.4 7.6 6.3  NEUTROABS 6.7  --  8.3*  --   --  5.0  HGB 12.3 11.6* 11.6* 11.3* 11.4* 11.9*  HCT 38.9 37.0 36.0 36.0 37.0 37.7  MCV 89.4 87.9 86.3 88.2 88.9 87.7  PLT 213 212 197 213 252 230   Basic Metabolic Panel: Recent Labs  Lab 04/06/23 1653 04/06/23 1654 04/08/23 0313 04/09/23 0313 04/10/23 0210 04/11/23 0503 04/12/23 0803 04/13/23 0424  NA  --    < > 143 137 136 137 140 136  K  --    < > 3.1* 3.3* 3.8 3.4* 4.2 4.0  CL  --    < > 108 100 99 99 103 98  CO2  --    < > 27 29 27 29 29 30   GLUCOSE  --    < > 102* 131* 186* 77 83 70  BUN  --    < > 29* 15 22 21  28* 13  CREATININE  --    < > 2.52* 1.60* 2.30* 2.09* 2.55* 1.88*  CALCIUM  --    < > 8.7* 8.8*  9.1 9.1 9.3 9.3  MG 1.8  --  2.1  --   --   --  1.9 2.2  PHOS 3.6  --  3.4  --  4.2  --  3.6  --    < > = values in this interval not displayed.   GFR: Estimated Creatinine Clearance: 12.7 mL/min (A) (by C-G formula based on SCr of 1.88 mg/dL (H)). Liver Function Tests: Recent Labs  Lab 04/06/23 1159 04/08/23 0313 04/10/23 0210 04/13/23 0424  AST 23 18  --  16  ALT 17 15  --  10  ALKPHOS 275* 194*  --  161*  BILITOT 0.3 0.7  --  0.8  PROT 6.6 5.9*  --  5.8*  ALBUMIN 3.8 2.9* 2.6* 2.7*    Code Status:  Full code.  Code status decision has been confirmed with: patient    Severity of Illness:  The appropriate patient status for this patient is INPATIENT. Inpatient status is judged to be reasonable and necessary in order to provide the required intensity of service to ensure the patient's safety. The patient's presenting symptoms, physical exam findings, and initial  radiographic and laboratory data in the context of their chronic comorbidities is felt to place them at high risk for further clinical deterioration. Furthermore, it is not anticipated that the patient will be medically stable for discharge from the hospital within 2 midnights of admission.   * I certify that at the point of admission it is my clinical judgment that the patient will require inpatient hospital care spanning beyond 2 midnights from the point of admission due to high intensity of service, high risk for further deterioration and high frequency of surveillance required.*  Time spent:  43 minutes  Author:  Marinda Elk MD  04/13/2023 8:33 AM

## 2023-04-13 NOTE — Plan of Care (Signed)

## 2023-04-13 NOTE — Plan of Care (Signed)
  Problem: Education: Goal: Knowledge of General Education information will improve Description Including pain rating scale, medication(s)/side effects and non-pharmacologic comfort measures Outcome: Progressing   Problem: Clinical Measurements: Goal: Ability to maintain clinical measurements within normal limits will improve Outcome: Progressing   Problem: Activity: Goal: Risk for activity intolerance will decrease Outcome: Progressing   

## 2023-04-13 NOTE — Assessment & Plan Note (Signed)
Continue sertraline and trazodone.  Added as needed alprazolam for anxiety.

## 2023-04-13 NOTE — Progress Notes (Signed)
PHARMACY CONSULT NOTE FOR:  OUTPATIENT  PARENTERAL ANTIBIOTIC THERAPY (OPAT)  Indication: S. Anginosus bacteremia Regimen: Cefazolin 2gm IV with HD on MWF End date: 05/05/2023  Weekly CBC/diff, CMP Fax weekly lab results promptly to [phone]    IV antibiotic discharge orders are pended. To discharging provider:  please sign these orders via discharge navigator,  Select New Orders & click on the button choice - Manage This Unsigned Work.     Thank you for allowing pharmacy to be a part of this patient's care.  Juliette Alcide, PharmD, BCPS, BCIDP Work Cell: [phone] 04/13/2023 2:43 PM

## 2023-04-13 NOTE — Progress Notes (Addendum)
[facility]  ROUNDING NOTE   Subjective:   [name] was admitted to [facility] on 04/07/2023 for Hypokalemia [E87.6] Pericardial effusion [I31.39] Pneumonia [J18.9] Failure to thrive in adult [R62.7] Urinary tract infection without hematuria, site unspecified [N39.0] Community acquired pneumonia, unspecified laterality [J18.9]   Update: Patient seen resting in bed Currently eating breakfast No complaints to offer at this time  Room air  Objective:  Vital signs in last 24 hours:  Temp:  [98.1 F (36.7 C)-98.4 F (36.9 C)] 98.4 F (36.9 C) (12/12 0810) Pulse Rate:  [70-76] 76 (12/12 0810) Resp:  [18-20] 20 (12/11 2301) BP: (124-152)/(54-64) 152/64 (12/12 0810) SpO2:  [100 %] 100 % (12/12 0810)  Weight change:  Filed Weights   04/10/23 1000 04/12/23 0755 04/12/23 1201  Weight: 42 kg 40.7 kg 39.8 kg    Intake/Output: I/O last 3 completed shifts: In: -  Out: 1000 [Other:1000]   Intake/Output this shift:  No intake/output data recorded.  Physical Exam: General: NAD  Head: +dysphonic voice   Eyes: Anicteric  Lungs:  Clear to auscultation, normal effort  Heart: Regular rate and rhythm  Abdomen:  Soft, nontender  Extremities:  no peripheral edema.  Neurologic: Alert and oriented, moving all four extremities  Skin: No lesions  Access: AVF    Basic Metabolic Panel: Recent Labs  Lab 04/06/23 1653 04/06/23 1654 04/08/23 0313 04/09/23 0313 04/10/23 0210 04/11/23 0503 04/12/23 0803 04/13/23 0424  NA  --    < > 143 137 136 137 140 136  K  --    < > 3.1* 3.3* 3.8 3.4* 4.2 4.0  CL  --    < > 108 100 99 99 103 98  CO2  --    < > 27 29 27 29 29 30   GLUCOSE  --    < > 102* 131* 186* 77 83 70  BUN  --    < > 29* 15 22 21  28* 13  CREATININE  --    < > 2.52* 1.60* 2.30* 2.09* 2.55* 1.88*  CALCIUM  --    < > 8.7* 8.8* 9.1 9.1 9.3 9.3  MG 1.8  --  2.1  --   --   --  1.9 2.2  PHOS 3.6  --  3.4  --  4.2  --  3.6  --    < > = values in this  interval not displayed.    Liver Function Tests: Recent Labs  Lab 04/08/23 0313 04/10/23 0210 04/13/23 0424  AST 18  --  16  ALT 15  --  10  ALKPHOS 194*  --  161*  BILITOT 0.7  --  0.8  PROT 5.9*  --  5.8*  ALBUMIN 2.9* 2.6* 2.7*   No results for input(s): "LIPASE", "AMYLASE" in the last 168 hours.  No results for input(s): "AMMONIA" in the last 168 hours.  CBC: Recent Labs  Lab 04/06/23 1654 04/08/23 0313 04/09/23 0313 04/10/23 0210 04/12/23 0803 04/13/23 0424  WBC 8.0 12.2* 9.5 9.4 7.6 6.3  NEUTROABS 6.7  --  8.3*  --   --  5.0  HGB 12.3 11.6* 11.6* 11.3* 11.4* 11.9*  HCT 38.9 37.0 36.0 36.0 37.0 37.7  MCV 89.4 87.9 86.3 88.2 88.9 87.7  PLT 213 212 197 213 252 230    Cardiac Enzymes: No results for input(s): "CKTOTAL", "CKMB", "CKMBINDEX", "TROPONINI" in the last 168 hours.  BNP: Invalid input(s): "POCBNP"  CBG: No results for input(s): "GLUCAP" in the  last 168 hours.  Microbiology: Results for orders placed or performed during the hospital encounter of 04/07/23  Culture, blood (routine x 2)     Status: None   Collection Time: 04/07/23  3:04 AM   Specimen: BLOOD  Result Value Ref Range Status   Specimen Description BLOOD BLOOD RIGHT ARM  Final   Special Requests   Final    BOTTLES DRAWN AEROBIC AND ANAEROBIC Blood Culture adequate volume   Culture   Final    NO GROWTH 5 DAYS Performed at [facility], [address] [place], [place] [address]    Report Status 04/12/2023 FINAL  Final  Culture, blood (routine x 2)     Status: Abnormal   Collection Time: 04/07/23  3:40 AM   Specimen: BLOOD  Result Value Ref Range Status   Specimen Description   Final    BLOOD BLOOD RIGHT FOREARM Performed at [facility], [address] [place], [place] [address]    Special Requests   Final    BOTTLES DRAWN AEROBIC AND ANAEROBIC Blood Culture adequate volume Performed at [facility], [address], [place], [place] [address]     Culture  Setup Time   Final    GRAM POSITIVE COCCI ANAEROBIC BOTTLE ONLY CRITICAL RESULT CALLED TO, READ BACK BY AND VERIFIED WITH: EMILY STEINBOCK @ 2104 04/07/23 LFD  Performed at [facility], [address], [place], [place] [address]    Culture STREPTOCOCCUS ANGINOSIS (A)  Final   Report Status 04/09/2023 FINAL  Final   Organism ID, Bacteria STREPTOCOCCUS ANGINOSIS  Final      Susceptibility   Streptococcus anginosis - MIC*    PENICILLIN 0.5 INTERMEDIATE Intermediate     CEFTRIAXONE <=0.12 SENSITIVE Sensitive     ERYTHROMYCIN <=0.12 SENSITIVE Sensitive     VANCOMYCIN <=0.12 SENSITIVE Sensitive     * STREPTOCOCCUS ANGINOSIS  Blood Culture ID Panel (Reflexed)     Status: Abnormal   Collection Time: 04/07/23  3:40 AM  Result Value Ref Range Status   Enterococcus faecalis NOT DETECTED NOT DETECTED Final   Enterococcus Faecium NOT DETECTED NOT DETECTED Final   Listeria monocytogenes NOT DETECTED NOT DETECTED Final   Staphylococcus species NOT DETECTED NOT DETECTED Final   Staphylococcus aureus (BCID) NOT DETECTED NOT DETECTED Final   Staphylococcus epidermidis NOT DETECTED NOT DETECTED Final   Staphylococcus lugdunensis NOT DETECTED NOT DETECTED Final   Streptococcus species DETECTED (A) NOT DETECTED Final    Comment: Not Enterococcus species, Streptococcus agalactiae, Streptococcus pyogenes, or Streptococcus pneumoniae. EMILY STEINBOCK @ 2104 04/07/23 LFD    Streptococcus agalactiae NOT DETECTED NOT DETECTED Final   Streptococcus pneumoniae NOT DETECTED NOT DETECTED Final   Streptococcus pyogenes NOT DETECTED NOT DETECTED Final   A.calcoaceticus-baumannii NOT DETECTED NOT DETECTED Final   Bacteroides fragilis NOT DETECTED NOT DETECTED Final   Enterobacterales NOT DETECTED NOT DETECTED Final   Enterobacter cloacae complex NOT DETECTED NOT DETECTED Final   Escherichia coli NOT DETECTED NOT DETECTED Final   Klebsiella aerogenes NOT DETECTED NOT DETECTED Final   Klebsiella  oxytoca NOT DETECTED NOT DETECTED Final   Klebsiella pneumoniae NOT DETECTED NOT DETECTED Final   Proteus species NOT DETECTED NOT DETECTED Final   Salmonella species NOT DETECTED NOT DETECTED Final   Serratia marcescens NOT DETECTED NOT DETECTED Final   Haemophilus influenzae NOT DETECTED NOT DETECTED Final   Neisseria meningitidis NOT DETECTED NOT DETECTED Final   Pseudomonas aeruginosa NOT DETECTED NOT DETECTED Final   Stenotrophomonas  maltophilia NOT DETECTED NOT DETECTED Final   Candida albicans NOT DETECTED NOT DETECTED Final   Candida auris NOT DETECTED NOT DETECTED Final   Candida glabrata NOT DETECTED NOT DETECTED Final   Candida krusei NOT DETECTED NOT DETECTED Final   Candida parapsilosis NOT DETECTED NOT DETECTED Final   Candida tropicalis NOT DETECTED NOT DETECTED Final   Cryptococcus neoformans/gattii NOT DETECTED NOT DETECTED Final    Comment: Performed at [facility], [address] [place], [place] [address]  Urine Culture     Status: Abnormal   Collection Time: 04/07/23  3:53 AM   Specimen: In/Out Cath Urine  Result Value Ref Range Status   Specimen Description   Final    IN/OUT CATH URINE Performed at [facility], [address] [place], [place] [address]    Special Requests   Final    NONE Performed at [facility], [address] [place], [place] [address]    Culture (A)  Final    3,000 COLONIES/mL ENTEROCOCCUS FAECALIS >=100,000 COLONIES/mL VIRIDANS STREPTOCOCCUS    Report Status 04/10/2023 FINAL  Final   Organism ID, Bacteria ENTEROCOCCUS FAECALIS (A)  Final      Susceptibility   Enterococcus faecalis - MIC*    AMPICILLIN <=2 SENSITIVE Sensitive     VANCOMYCIN 1 SENSITIVE Sensitive     NITROFURANTOIN <=16 SENSITIVE Sensitive     * 3,000 COLONIES/mL ENTEROCOCCUS FAECALIS  Resp panel by RT-PCR (RSV, Flu A&B, Covid) Anterior Nasal Swab     Status: None   Collection Time: 04/07/23  3:53 AM   Specimen: Anterior Nasal Swab   Result Value Ref Range Status   SARS Coronavirus 2 by RT PCR NEGATIVE NEGATIVE Final    Comment: (NOTE) SARS-CoV-2 target nucleic acids are NOT DETECTED.  The SARS-CoV-2 RNA is generally detectable in upper respiratory specimens during the acute phase of infection. The lowest concentration of SARS-CoV-2 viral copies this assay can detect is 138 copies/mL. A negative result does not preclude SARS-Cov-2 infection and should not be used as the sole basis for treatment or other patient management decisions. A negative result may occur with  improper specimen collection/handling, submission of specimen other than nasopharyngeal swab, presence of viral mutation(s) within the areas targeted by this assay, and inadequate number of viral copies(<138 copies/mL). A negative result must be combined with clinical observations, patient history, and epidemiological information. The expected result is Negative.  Fact Sheet for Patients:  BloggerCourse.com  Fact Sheet for Healthcare Providers:  SeriousBroker.it  This test is no t yet approved or cleared by the [place] FDA and  has been authorized for detection and/or diagnosis of SARS-CoV-2 by FDA under an Emergency Use Authorization (EUA). This EUA will remain  in effect (meaning this test can be used) for the duration of the COVID-19 declaration under Section 564(b)(1) of the Act, 21 U.S.C.section 360bbb-3(b)(1), unless the authorization is terminated  or revoked sooner.       Influenza A by PCR NEGATIVE NEGATIVE Final   Influenza B by PCR NEGATIVE NEGATIVE Final    Comment: (NOTE) The Xpert Xpress SARS-CoV-2/FLU/RSV plus assay is intended as an aid in the diagnosis of influenza from Nasopharyngeal swab specimens and should not be used as a sole basis for treatment. Nasal washings and aspirates are unacceptable for Xpert Xpress SARS-CoV-2/FLU/RSV testing.  Fact Sheet for  Patients: BloggerCourse.com  Fact Sheet for Healthcare Providers: SeriousBroker.it  This test is not yet approved or cleared by the [place] and  has been authorized for detection and/or diagnosis of SARS-CoV-2 by FDA under an Emergency Use Authorization (EUA). This EUA will remain in effect (meaning this test can be used) for the duration of the COVID-19 declaration under Section 564(b)(1) of the Act, 21 U.S.C. section 360bbb-3(b)(1), unless the authorization is terminated or revoked.     Resp Syncytial Virus by PCR NEGATIVE NEGATIVE Final    Comment: (NOTE) Fact Sheet for Patients: BloggerCourse.com  Fact Sheet for Healthcare Providers: SeriousBroker.it  This test is not yet approved or cleared by the [place] FDA and has been authorized for detection and/or diagnosis of SARS-CoV-2 by FDA under an Emergency Use Authorization (EUA). This EUA will remain in effect (meaning this test can be used) for the duration of the COVID-19 declaration under Section 564(b)(1) of the Act, 21 U.S.C. section 360bbb-3(b)(1), unless the authorization is terminated or revoked.  Performed at [facility], [address] [place], [place] [address]   Expectorated Sputum Assessment w Gram Stain, Rflx to Resp Cult     Status: None   Collection Time: 04/09/23 12:24 PM   Specimen: Sputum  Result Value Ref Range Status   Specimen Description SPUTUM  Final   Special Requests NONE  Final   Sputum evaluation   Final    Sputum specimen not acceptable for testing.  Please recollect.   C/JOAN WILLIS AT 1329 04/09/23.PMF Performed at [facility], [address] [place], [place] [address]    Report Status 04/09/2023 FINAL  Final  Culture, blood (Routine X 2) w Reflex to ID Panel     Status: None (Preliminary result)   Collection Time: 04/10/23  6:10 PM   Specimen: BLOOD   Result Value Ref Range Status   Specimen Description BLOOD RIGHT ANTECUBITAL  Final   Special Requests   Final    BOTTLES DRAWN AEROBIC AND ANAEROBIC Blood Culture adequate volume   Culture   Final    NO GROWTH 3 DAYS Performed at [facility] [address] [place], [place] [address]    Report Status PENDING  Incomplete  Culture, blood (Routine X 2) w Reflex to ID Panel     Status: None (Preliminary result)   Collection Time: 04/10/23  6:10 PM   Specimen: BLOOD RIGHT HAND  Result Value Ref Range Status   Specimen Description BLOOD RIGHT HAND  Final   Special Requests   Final    BOTTLES DRAWN AEROBIC AND ANAEROBIC Blood Culture adequate volume   Culture   Final    NO GROWTH 3 DAYS Performed at [facility], [address] [place], [place] [address]    Report Status PENDING  Incomplete    Coagulation Studies: No results for input(s): "LABPROT", "INR" in the last 72 hours.  Urinalysis: No results for input(s): "COLORURINE", "LABSPEC", "PHURINE", "GLUCOSEU", "HGBUR", "BILIRUBINUR", "KETONESUR", "PROTEINUR", "UROBILINOGEN", "NITRITE", "LEUKOCYTESUR" in the last 72 hours.  Invalid input(s): "APPERANCEUR"     Imaging: No results found.   Medications:     ceFAZolin (ANCEF) IV Stopped (04/12/23 1138)    (feeding supplement) PROSource Plus  30 mL Oral BID BM   brimonidine  1 drop Both Eyes BID   carvedilol  3.125 mg Oral BID   feeding supplement  237 mL Oral TID BM   heparin  5,000 Units Subcutaneous Q12H   multivitamin  1 tablet Oral Daily   nystatin  5 mL Oral QID   pantoprazole  40 mg Oral Daily   sertraline  25 mg Oral  Daily   torsemide  100 mg Oral Daily   acetaminophen, ALPRAZolam, hydrALAZINE, ondansetron **OR** ondansetron (ZOFRAN) IV, phenol, traZODone  Assessment/ Plan:  [name] is a 89 y.o.  female wth end stage renal disease on hemodialysis, hypertension, congestive heart failure, depression, GERD who presents to [facility]  with 04/07/2023 with Hypokalemia [E87.6] Pericardial effusion [I31.39] Pneumonia [J18.9] Failure to thrive in adult [R62.7] Urinary tract infection without hematuria, site unspecified [N39.0] Community acquired pneumonia, unspecified laterality [J18.9]  [facility] MWF Left AVF 43kg  End Stage Renal Disease: Received dialysis yesterday, 1L achieved. Next treatment scheduled for Friday. Awaiting rehab acceptance.  Hypertension with chronic kidney disease: Continue torsemide.  Blood pressure acceptable for this patient  Anemia with chronic kidney disease: mircera as outpatient. Hemoglobin 11.9.  Secondary Hyperparathyroidism: currently holding phosphate binders. Gets parsabiv outpatient.  Will continue to monitor bone minerals during this time.   5. Streptococcal bacteremia secondary to UTI. Blood cultures and urine cultures positive for strep anginosus. Receiving IV ceftriaxone. ID consulted and recommends Cefazolin 2g/2g/2g with each dialysis until 05/05/2023. Weekly labs of CBC with diff and CMP requested.    LOS: 6 Lori Stanley 12/12/20241:41 PM

## 2023-04-13 NOTE — Progress Notes (Signed)
*  PRELIMINARY RESULTS* Echocardiogram 2D Echocardiogram has been performed.  Lori Stanley 04/13/2023, 7:56 AM

## 2023-04-13 NOTE — Assessment & Plan Note (Addendum)
See assessment and plan above

## 2023-04-13 NOTE — Assessment & Plan Note (Addendum)
No clinical evidence of cardiogenic volume overload Strict input and output monitoring Daily weights Low-sodium diet  

## 2023-04-13 NOTE — Consult Note (Signed)
PHARMACY CONSULT NOTE - ELECTROLYTES  Pharmacy Consult for Electrolyte Monitoring and Replacement   Recent Labs: Height: 5\' 2"  (157.5 cm) Weight: 39.8 kg (87 lb 11.9 oz) IBW/kg (Calculated) : 50.1 Estimated Creatinine Clearance: 12.7 mL/min (A) (by C-G formula based on SCr of 1.88 mg/dL (H)).  Potassium (mmol/L)  Date Value  04/13/2023 4.0  05/15/2013 4.0   Magnesium (mg/dL)  Date Value  38/18/2993 2.2   Calcium (mg/dL)  Date Value  71/69/6789 9.3   Calcium, Total (mg/dL)  Date Value  38/01/1750 8.0 (L)   Albumin (g/dL)  Date Value  02/58/5277 2.7 (L)  04/06/2023 3.8  05/11/2013 3.6   Phosphorus (mg/dL)  Date Value  82/42/3536 3.6   Sodium (mmol/L)  Date Value  04/13/2023 136  04/06/2023 141  05/15/2013 132 (L)   Assessment  Lori Stanley is a 89 y.o. female presenting with failure to thrive. Pharmacy has been consulted to monitor and replace electrolytes. ESRD on hemodialysis    Pertinent medications: torsemide 100 mg po daily  Goal of Therapy: Electrolytes WNL  Plan:  No supplementation needed Electrolytes stable x 2 days, will transition to monitoring electrolytes q72h Check BMP on 12/15  Thank you for allowing pharmacy to be a part of this patient's care.  Merryl Hacker, PharmD 04/13/2023 7:18 AM

## 2023-04-13 NOTE — TOC Progression Note (Addendum)
Transition of Care [facility]) - Progression Note    Patient Details  Name: Lori Stanley MRN: [id number] Date of Birth: Jun 18, 1931  [facility] of [facility]) CM/SW Contact  Marlowe Sax, RN Phone Number: 04/13/2023, 12:36 PM  Clinical Narrative:    PASSR Is still pending, they requested FL2, Uploaded the FL2 to [place] [facility], awaiting a PASSR Met with the patient and reviewed the bed offers, she would like to go to [facility], I notified Stanton Kidney at [facility], will need Ins approval it is pending PASSR [id number] A Expected Discharge Plan: Skilled Nursing Facility Barriers to Discharge: Continued Medical Work up  Expected Discharge Plan and Services       Living arrangements for the past 2 months: Single Family Home                                       Social Determinants of Health (SDOH) Interventions SDOH Screenings   Food Insecurity: No Food Insecurity (04/08/2023)  Housing: Low Risk  (04/08/2023)  Transportation Needs: No Transportation Needs (04/08/2023)  Utilities: Not At Risk (04/08/2023)  Alcohol Screen: Low Risk  (02/02/2023)  Depression (PHQ2-9): Low Risk  (04/06/2023)  Financial Resource Strain: Low Risk  (11/01/2022)  Physical Activity: Inactive (11/01/2022)  Social Connections: Socially Isolated (11/01/2022)  Stress: No Stress Concern Present (11/01/2022)  Tobacco Use: Medium Risk (04/06/2023)    Readmission Risk Interventions    04/09/2023    2:34 PM 07/20/2020    3:45 PM  Readmission Risk Prevention Plan  Transportation Screening Complete Complete  PCP or Specialist Appt within 5-7 Days Complete   PCP or Specialist Appt within 3-5 Days  Complete  Home Care Screening Complete   Medication Review (RN CM) Complete   HRI or Home Care Consult  Complete  Palliative Care Screening  Not Applicable  Medication Review (RN Care Manager)  Complete

## 2023-04-14 DIAGNOSIS — N390 Urinary tract infection, site not specified: Secondary | ICD-10-CM | POA: Diagnosis not present

## 2023-04-14 DIAGNOSIS — R1310 Dysphagia, unspecified: Secondary | ICD-10-CM | POA: Diagnosis not present

## 2023-04-14 DIAGNOSIS — R7881 Bacteremia: Secondary | ICD-10-CM | POA: Diagnosis not present

## 2023-04-14 DIAGNOSIS — N186 End stage renal disease: Secondary | ICD-10-CM | POA: Diagnosis not present

## 2023-04-14 LAB — PHOSPHORUS: Phosphorus: 3.9 mg/dL (ref 2.5–4.6)

## 2023-04-14 LAB — MAGNESIUM: Magnesium: 1.9 mg/dL (ref 1.7–2.4)

## 2023-04-14 LAB — COMPREHENSIVE METABOLIC PANEL
ALT: 6 U/L (ref 0–44)
AST: 13 U/L — ABNORMAL LOW (ref 15–41)
Albumin: 2.6 g/dL — ABNORMAL LOW (ref 3.5–5.0)
Alkaline Phosphatase: 138 U/L — ABNORMAL HIGH (ref 38–126)
Anion gap: 6 (ref 5–15)
BUN: 26 mg/dL — ABNORMAL HIGH (ref 8–23)
CO2: 31 mmol/L (ref 22–32)
Calcium: 8.9 mg/dL (ref 8.9–10.3)
Chloride: 98 mmol/L (ref 98–111)
Creatinine, Ser: 2.7 mg/dL — ABNORMAL HIGH (ref 0.44–1.00)
GFR, Estimated: 16 mL/min — ABNORMAL LOW (ref >60)
Glucose, Bld: 77 mg/dL (ref 70–99)
Potassium: 3.9 mmol/L (ref 3.5–5.1)
Sodium: 135 mmol/L (ref 135–145)
Total Bilirubin: 0.6 mg/dL (ref <1.2)
Total Protein: 5.4 g/dL — ABNORMAL LOW (ref 6.5–8.1)

## 2023-04-14 LAB — CBC
HCT: 33.9 % — ABNORMAL LOW (ref 36.0–46.0)
Hemoglobin: 10.6 g/dL — ABNORMAL LOW (ref 12.0–15.0)
MCH: 27.8 pg (ref 26.0–34.0)
MCHC: 31.3 g/dL (ref 30.0–36.0)
MCV: 89 fL (ref 80.0–100.0)
Platelets: 225 10*3/uL (ref 150–400)
RBC: 3.81 MIL/uL — ABNORMAL LOW (ref 3.87–5.11)
RDW: 14.8 % (ref 11.5–15.5)
WBC: 5.9 10*3/uL (ref 4.0–10.5)
nRBC: 0 % (ref 0.0–0.2)

## 2023-04-14 MED ORDER — PENTAFLUOROPROP-TETRAFLUOROETH EX AERO: 1.0000 | INHALATION_SPRAY | CUTANEOUS | Status: DC | PRN

## 2023-04-14 MED ORDER — CHLORHEXIDINE GLUCONATE CLOTH 2 % EX PADS
6.0000 | MEDICATED_PAD | Freq: Every day | CUTANEOUS | Status: DC
  Administered 2023-04-14 – 2023-04-18 (×5): 6 via TOPICAL

## 2023-04-14 MED ORDER — LIDOCAINE-PRILOCAINE 2.5-2.5 % EX CREA: 1.0000 | TOPICAL_CREAM | CUTANEOUS | Status: DC | PRN

## 2023-04-14 MED ORDER — HEPARIN SODIUM (PORCINE) 1000 UNIT/ML DIALYSIS: 1000.0000 [IU] | INTRAMUSCULAR | Status: DC | PRN

## 2023-04-14 MED ORDER — ACETAMINOPHEN 325 MG PO TABS
ORAL_TABLET | ORAL | Status: AC
  Filled 2023-04-14: qty 2

## 2023-04-14 NOTE — Assessment & Plan Note (Signed)
 Relaxed blood pressure targets due to advanced age  continue blood pressure control with carvedilol.  As intravenous hydralazine for markedly elevated blood pressure.

## 2023-04-14 NOTE — Progress Notes (Signed)
PT Cancellation Note  Patient Details Name: Lori Stanley MRN: [id number] DOB: [birth year]   Cancelled Treatment:    Reason Eval/Treat Not Completed: Patient at procedure or test/unavailable.  Pt currently off floor at dialysis.  Will re-attempt PT session at a later date/time.  Hendricks Limes, PT 04/14/23, 1:21 PM

## 2023-04-14 NOTE — Assessment & Plan Note (Signed)
Strep anginosus growing out on both urine and blood cultures  Previously treated with intravenous ceftriaxone however now infectious disease is recommending patient be transition to intravenous Ancef to be administered with dialysis over the course of a total of 4 weeks. Enterococcus faecalis growing out of urine on 12/6 felt to be a contaminant.   Infectious disease following, their input is appreciated  TTE performed 12/12 reveals no evidence of infective endocarditis.  Patient unlikely to tolerate TEE due to cervical disease.   Repeat surveillance blood cultures on 12/9 are without growth.

## 2023-04-14 NOTE — Progress Notes (Signed)
OT Cancellation Note  Patient Details Name: Lori Stanley MRN: [id number] DOB: May 11, 1931   Cancelled Treatment:    Reason Eval/Treat Not Completed: Patient at procedure or test/ unavailable. Pt at dialysis, out of the room. Will re-attempt as pt is available for OT tx.  Arman Filter., MPH, MS, OTR/L ascom [phone] 04/14/23, 10:25 AM

## 2023-04-14 NOTE — Plan of Care (Signed)

## 2023-04-14 NOTE — Progress Notes (Signed)
PROGRESS NOTE   Lori Stanley  GHW:[id number] DOB: 1931/05/29 DOA: 04/07/2023 PCP: [name], MD   Date of Service: the patient was seen and examined on 04/14/2023  Brief Narrative:  89 yo female with the past medical history of heart failure, hypertension, ESRD, and gastritis who presented with difficulty swallowing for 2 to 3 weeks prior to hospitalization with progressive weight loss.  Due to concerns over failure to thrive and volume depletion the hospitalist group was then called to assess the patient for admission to the hospital.  During a thorough evaluation patient was identified to have urinalysis suggestive of urinary tract infection.  Urine culture on 12/5 grew out streptococcus anginosus with additional blood culture drawn on 12/6 growing out same organism.  Of note, urine culture performed on 12/6 grew out Enterococcus faecalis however this was felt to be contaminant.  Patient was treated with intravenous ceftriaxone and infectious disease was consulted.  Infectious disease recommended evaluating for infective endocarditis in the setting of systolic murmur on cardiac examination although due to cervical spine disease a TEE could not be performed.  They recommended a TTE instead.  Concerning the patient's difficulty swallowing Dr. [name] with ENT was consulted and performed a transnasal flexible laryngoscopy revealing some mild edema on laryngoscopy with a short course of systemic steroids recommended.  Patient received hemodialysis per her usual Monday, Wednesday, Friday dialysis treatment schedule while hospitalized.  Throughout this complicated hospitalization physical therapy evaluated the patient and patient felt the patient would benefit from skilled services in a skilled nursing facility.  Plan is for patient to proceed with skilled nurse facility placement once bed is obtained and insurance authorization is complete.   Assessment & Plan Streptococcal  bacteremia Strep anginosus growing out on both urine and blood cultures  Previously treated with intravenous ceftriaxone however now infectious disease is recommending patient be transition to intravenous Ancef to be administered with dialysis over the course of a total of 4 weeks. Enterococcus faecalis growing out of urine on 12/6 felt to be a contaminant.   Infectious disease following, their input is appreciated  TTE performed 12/12 reveals no evidence of infective endocarditis.  Patient unlikely to tolerate TEE due to cervical disease.   Repeat surveillance blood cultures on 12/9 are without growth. UTI (urinary tract infection) See assessment and plan above ESRD (end stage renal disease) [facility]) Nephrology following, their assistance is appreciated  Patient receiving hemodialysis per her schedule of M-W-F.   Dysphagia Continue pantoprazole for antiacid therapy Per ENT recommendations with a short course of prednisone for laryngeal edema seen on laryngoscopy during this hospitalization.  Will likely discontinue at time of discharge.  Essential hypertension Relaxed blood pressure targets due to advanced age  continue blood pressure control with carvedilol.  As intravenous hydralazine for markedly elevated blood pressure. Type 2 diabetes mellitus without complication, without long-term current use of insulin (HCC) Longstanding uncomplicated diabetes mellitus, supportive care  Last reported hemoglobin A1c of 5.0% in 2023 Chronic diastolic CHF (congestive heart failure) (HCC) No clinical evidence of cardiogenic volume overload Strict input and output monitoring Daily weights Low-sodium diet  Anxiety Continue sertraline and trazodone.  Added as needed alprazolam for anxiety.      Subjective:  Patient currently has no complaints.    Physical Exam:  Vitals:   04/14/23 1300 04/14/23 1332 04/14/23 1406 04/14/23 1510  BP: (!) 145/63  (!) 164/76 (!) 127/56  Pulse:   70 77  Resp:  20  20 15   Temp: (!) 96.9  F (36.1 C)  97.7 F (36.5 C) 97.8 F (36.6 C)  TempSrc: Axillary  Oral   SpO2: 95%  99% 98%  Weight:  39.7 kg    Height:         Constitutional: Awake alert and oriented x3, no associated distress.   Skin: no rashes, no lesions, good skin turgor noted. Eyes: Pupils are equally reactive to light.  No evidence of scleral icterus or conjunctival pallor.  ENMT: Moist mucous membranes noted.  Posterior pharynx clear of any exudate or lesions.   Respiratory: clear to auscultation bilaterally, no wheezing, no crackles. Normal respiratory effort. No accessory muscle use.  Cardiovascular: Notable 3 out of 6 systolic murmur.  No extremity edema. 2+ pedal pulses. No carotid bruits.  Abdomen: Abdomen is soft and nontender.  No evidence of intra-abdominal masses.  Positive bowel sounds noted in all quadrants.   Musculoskeletal: No joint deformity upper and lower extremities. Good ROM, no contractures. Normal muscle tone.    Data Reviewed:  I have personally reviewed and interpreted labs, imaging.  Significant findings are   CBC: Recent Labs  Lab 04/09/23 0313 04/10/23 0210 04/12/23 0803 04/13/23 0424 04/14/23 0422  WBC 9.5 9.4 7.6 6.3 5.9  NEUTROABS 8.3*  --   --  5.0  --   HGB 11.6* 11.3* 11.4* 11.9* 10.6*  HCT 36.0 36.0 37.0 37.7 33.9*  MCV 86.3 88.2 88.9 87.7 89.0  PLT 197 213 252 230 225   Basic Metabolic Panel: Recent Labs  Lab 04/08/23 0313 04/09/23 0313 04/10/23 0210 04/11/23 0503 04/12/23 0803 04/13/23 0424 04/14/23 0422  NA 143   < > 136 137 140 136 135  K 3.1*   < > 3.8 3.4* 4.2 4.0 3.9  CL 108   < > 99 99 103 98 98  CO2 27   < > 27 29 29 30 31   GLUCOSE 102*   < > 186* 77 83 70 77  BUN 29*   < > 22 21 28* 13 26*  CREATININE 2.52*   < > 2.30* 2.09* 2.55* 1.88* 2.70*  CALCIUM 8.7*   < > 9.1 9.1 9.3 9.3 8.9  MG 2.1  --   --   --  1.9 2.2 1.9  PHOS 3.4  --  4.2  --  3.6  --  3.9   < > = values in this interval not displayed.    GFR: Estimated Creatinine Clearance: 8.9 mL/min (A) (by C-G formula based on SCr of 2.7 mg/dL (H)). Liver Function Tests: Recent Labs  Lab 04/08/23 0313 04/10/23 0210 04/13/23 0424 04/14/23 0422  AST 18  --  16 13*  ALT 15  --  10 6  ALKPHOS 194*  --  161* 138*  BILITOT 0.7  --  0.8 0.6  PROT 5.9*  --  5.8* 5.4*  ALBUMIN 2.9* 2.6* 2.7* 2.6*    Code Status:  Full code.  Code status decision has been confirmed with: patient    Severity of Illness:  The appropriate patient status for this patient is INPATIENT. Inpatient status is judged to be reasonable and necessary in order to provide the required intensity of service to ensure the patient's safety. The patient's presenting symptoms, physical exam findings, and initial radiographic and laboratory data in the context of their chronic comorbidities is felt to place them at high risk for further clinical deterioration. Furthermore, it is not anticipated that the patient will be medically stable for discharge from the hospital within 2 midnights of  admission.   * I certify that at the point of admission it is my clinical judgment that the patient will require inpatient hospital care spanning beyond 2 midnights from the point of admission due to high intensity of service, high risk for further deterioration and high frequency of surveillance required.*  Time spent:  35 minutes  Author:  Marinda Elk MD  04/14/2023 7:05 PM

## 2023-04-14 NOTE — Assessment & Plan Note (Signed)
 Longstanding uncomplicated diabetes mellitus, supportive care  Last reported hemoglobin A1c of 5.0% in 2023

## 2023-04-14 NOTE — Progress Notes (Signed)
[facility]  ROUNDING NOTE   Subjective:   [name] was admitted to [facility] on 04/07/2023 for Hypokalemia [E87.6] Pericardial effusion [I31.39] Pneumonia [J18.9] Failure to thrive in adult [R62.7] Urinary tract infection without hematuria, site unspecified [N39.0] Community acquired pneumonia, unspecified laterality [J18.9]   Update: Patient seen and evaluated during dialysis   HEMODIALYSIS FLOWSHEET:  Blood Flow Rate (mL/min): 249 mL/min Arterial Pressure (mmHg): -184.84 mmHg Venous Pressure (mmHg): 329.28 mmHg TMP (mmHg): 18.38 mmHg Ultrafiltration Rate (mL/min): 831 mL/min Dialysate Flow Rate (mL/min): 299 ml/min Dialysis Fluid Bolus: Normal Saline Bolus Amount (mL): 100 mL  Tolerating treatment.   Objective:  Vital signs in last 24 hours:  Temp:  [97.7 F (36.5 C)-98.3 F (36.8 C)] 97.7 F (36.5 C) (12/13 0900) Pulse Rate:  [65-77] 65 (12/13 1100) Resp:  [16-30] 18 (12/13 1100) BP: (128-172)/(47-80) 129/54 (12/13 1100) SpO2:  [93 %-100 %] 95 % (12/13 1100) Weight:  [39.7 kg] 39.7 kg (12/13 0905)  Weight change:  Filed Weights   04/12/23 0755 04/12/23 1201 04/14/23 0905  Weight: 40.7 kg 39.8 kg 39.7 kg    Intake/Output: No intake/output data recorded.   Intake/Output this shift:  No intake/output data recorded.  Physical Exam: General: NAD  Head: +dysphonic voice   Eyes: Anicteric  Lungs:  Clear to auscultation, normal effort  Heart: Regular rate and rhythm  Abdomen:  Soft, nontender  Extremities:  no peripheral edema.  Neurologic: Alert and oriented, moving all four extremities  Skin: No lesions  Access: AVF    Basic Metabolic Panel: Recent Labs  Lab 04/08/23 0313 04/09/23 0313 04/10/23 0210 04/11/23 0503 04/12/23 0803 04/13/23 0424 04/14/23 0422  NA 143   < > 136 137 140 136 135  K 3.1*   < > 3.8 3.4* 4.2 4.0 3.9  CL 108   < > 99 99 103 98 98  CO2 27   < > 27 29 29 30 31   GLUCOSE 102*   < > 186* 77 83 70 77   BUN 29*   < > 22 21 28* 13 26*  CREATININE 2.52*   < > 2.30* 2.09* 2.55* 1.88* 2.70*  CALCIUM 8.7*   < > 9.1 9.1 9.3 9.3 8.9  MG 2.1  --   --   --  1.9 2.2 1.9  PHOS 3.4  --  4.2  --  3.6  --  3.9   < > = values in this interval not displayed.    Liver Function Tests: Recent Labs  Lab 04/08/23 0313 04/10/23 0210 04/13/23 0424 04/14/23 0422  AST 18  --  16 13*  ALT 15  --  10 6  ALKPHOS 194*  --  161* 138*  BILITOT 0.7  --  0.8 0.6  PROT 5.9*  --  5.8* 5.4*  ALBUMIN 2.9* 2.6* 2.7* 2.6*   No results for input(s): "LIPASE", "AMYLASE" in the last 168 hours.  No results for input(s): "AMMONIA" in the last 168 hours.  CBC: Recent Labs  Lab 04/09/23 0313 04/10/23 0210 04/12/23 0803 04/13/23 0424 04/14/23 0422  WBC 9.5 9.4 7.6 6.3 5.9  NEUTROABS 8.3*  --   --  5.0  --   HGB 11.6* 11.3* 11.4* 11.9* 10.6*  HCT 36.0 36.0 37.0 37.7 33.9*  MCV 86.3 88.2 88.9 87.7 89.0  PLT 197 213 252 230 225    Cardiac Enzymes: No results for input(s): "CKTOTAL", "CKMB", "CKMBINDEX", "TROPONINI" in the last 168 hours.  BNP: Invalid input(s): "POCBNP"  CBG: No results for input(s): "GLUCAP" in the last 168 hours.  Microbiology: Results for orders placed or performed during the hospital encounter of 04/07/23  Culture, blood (routine x 2)     Status: None   Collection Time: 04/07/23  3:04 AM   Specimen: BLOOD  Result Value Ref Range Status   Specimen Description BLOOD BLOOD RIGHT ARM  Final   Special Requests   Final    BOTTLES DRAWN AEROBIC AND ANAEROBIC Blood Culture adequate volume   Culture   Final    NO GROWTH 5 DAYS Performed at [facility], [address] [place], [place] [address]    Report Status 04/12/2023 FINAL  Final  Culture, blood (routine x 2)     Status: Abnormal (Preliminary result)   Collection Time: 04/07/23  3:40 AM   Specimen: BLOOD  Result Value Ref Range Status   Specimen Description   Final    BLOOD BLOOD RIGHT FOREARM Performed at [facility], [address], [place], [place] [address]    Special Requests   Final    BOTTLES DRAWN AEROBIC AND ANAEROBIC Blood Culture adequate volume Performed at [facility], [address] [place], [place] [address]    Culture  Setup Time   Final    GRAM POSITIVE COCCI ANAEROBIC BOTTLE ONLY CRITICAL RESULT CALLED TO, READ BACK BY AND VERIFIED WITH: EMILY STEINBOCK @ 2104 04/07/23 LFD     Culture (A)  Final    STREPTOCOCCUS ANGINOSIS Sent to [facility] for further susceptibility testing. Performed at [facility], [address], [place], [place] [address]    Report Status PENDING  Incomplete   Organism ID, Bacteria STREPTOCOCCUS ANGINOSIS  Final      Susceptibility   Streptococcus anginosis - MIC*    PENICILLIN 0.5 INTERMEDIATE Intermediate     CEFTRIAXONE <=0.12 SENSITIVE Sensitive     ERYTHROMYCIN <=0.12 SENSITIVE Sensitive     VANCOMYCIN <=0.12 SENSITIVE Sensitive     * STREPTOCOCCUS ANGINOSIS  Blood Culture ID Panel (Reflexed)     Status: Abnormal   Collection Time: 04/07/23  3:40 AM  Result Value Ref Range Status   Enterococcus faecalis NOT DETECTED NOT DETECTED Final   Enterococcus Faecium NOT DETECTED NOT DETECTED Final   Listeria monocytogenes NOT DETECTED NOT DETECTED Final   Staphylococcus species NOT DETECTED NOT DETECTED Final   Staphylococcus aureus (BCID) NOT DETECTED NOT DETECTED Final   Staphylococcus epidermidis NOT DETECTED NOT DETECTED Final   Staphylococcus lugdunensis NOT DETECTED NOT DETECTED Final   Streptococcus species DETECTED (A) NOT DETECTED Final    Comment: Not Enterococcus species, Streptococcus agalactiae, Streptococcus pyogenes, or Streptococcus pneumoniae. EMILY STEINBOCK @ 2104 04/07/23 LFD    Streptococcus agalactiae NOT DETECTED NOT DETECTED Final   Streptococcus pneumoniae NOT DETECTED NOT DETECTED Final   Streptococcus pyogenes NOT DETECTED NOT DETECTED Final   A.calcoaceticus-baumannii NOT DETECTED NOT DETECTED  Final   Bacteroides fragilis NOT DETECTED NOT DETECTED Final   Enterobacterales NOT DETECTED NOT DETECTED Final   Enterobacter cloacae complex NOT DETECTED NOT DETECTED Final   Escherichia coli NOT DETECTED NOT DETECTED Final   Klebsiella aerogenes NOT DETECTED NOT DETECTED Final   Klebsiella oxytoca NOT DETECTED NOT DETECTED Final   Klebsiella pneumoniae NOT DETECTED NOT DETECTED Final   Proteus species NOT DETECTED NOT DETECTED Final   Salmonella species NOT DETECTED NOT DETECTED Final   Serratia marcescens NOT DETECTED NOT DETECTED Final   Haemophilus influenzae NOT DETECTED NOT DETECTED Final  Neisseria meningitidis NOT DETECTED NOT DETECTED Final   Pseudomonas aeruginosa NOT DETECTED NOT DETECTED Final   Stenotrophomonas maltophilia NOT DETECTED NOT DETECTED Final   Candida albicans NOT DETECTED NOT DETECTED Final   Candida auris NOT DETECTED NOT DETECTED Final   Candida glabrata NOT DETECTED NOT DETECTED Final   Candida krusei NOT DETECTED NOT DETECTED Final   Candida parapsilosis NOT DETECTED NOT DETECTED Final   Candida tropicalis NOT DETECTED NOT DETECTED Final   Cryptococcus neoformans/gattii NOT DETECTED NOT DETECTED Final    Comment: Performed at [facility], [address] [place], [place] [address]  Urine Culture     Status: Abnormal   Collection Time: 04/07/23  3:53 AM   Specimen: In/Out Cath Urine  Result Value Ref Range Status   Specimen Description   Final    IN/OUT CATH URINE Performed at [facility], [address], [place], [place] [address]    Special Requests   Final    NONE Performed at [facility], [address] [place], [place] [address]    Culture (A)  Final    3,000 COLONIES/mL ENTEROCOCCUS FAECALIS >=100,000 COLONIES/mL VIRIDANS STREPTOCOCCUS    Report Status 04/10/2023 FINAL  Final   Organism ID, Bacteria ENTEROCOCCUS FAECALIS (A)  Final      Susceptibility   Enterococcus faecalis - MIC*    AMPICILLIN <=2  SENSITIVE Sensitive     VANCOMYCIN 1 SENSITIVE Sensitive     NITROFURANTOIN <=16 SENSITIVE Sensitive     * 3,000 COLONIES/mL ENTEROCOCCUS FAECALIS  Resp panel by RT-PCR (RSV, Flu A&B, Covid) Anterior Nasal Swab     Status: None   Collection Time: 04/07/23  3:53 AM   Specimen: Anterior Nasal Swab  Result Value Ref Range Status   SARS Coronavirus 2 by RT PCR NEGATIVE NEGATIVE Final    Comment: (NOTE) SARS-CoV-2 target nucleic acids are NOT DETECTED.  The SARS-CoV-2 RNA is generally detectable in upper respiratory specimens during the acute phase of infection. The lowest concentration of SARS-CoV-2 viral copies this assay can detect is 138 copies/mL. A negative result does not preclude SARS-Cov-2 infection and should not be used as the sole basis for treatment or other patient management decisions. A negative result may occur with  improper specimen collection/handling, submission of specimen other than nasopharyngeal swab, presence of viral mutation(s) within the areas targeted by this assay, and inadequate number of viral copies(<138 copies/mL). A negative result must be combined with clinical observations, patient history, and epidemiological information. The expected result is Negative.  Fact Sheet for Patients:  BloggerCourse.com  Fact Sheet for Healthcare Providers:  SeriousBroker.it  This test is no t yet approved or cleared by the [place] FDA and  has been authorized for detection and/or diagnosis of SARS-CoV-2 by FDA under an Emergency Use Authorization (EUA). This EUA will remain  in effect (meaning this test can be used) for the duration of the COVID-19 declaration under Section 564(b)(1) of the Act, 21 U.S.C.section 360bbb-3(b)(1), unless the authorization is terminated  or revoked sooner.       Influenza A by PCR NEGATIVE NEGATIVE Final   Influenza B by PCR NEGATIVE NEGATIVE Final    Comment: (NOTE) The Xpert  Xpress SARS-CoV-2/FLU/RSV plus assay is intended as an aid in the diagnosis of influenza from Nasopharyngeal swab specimens and should not be used as a sole basis for treatment. Nasal washings and aspirates are unacceptable for Xpert Xpress SARS-CoV-2/FLU/RSV testing.  Fact Sheet for Patients: BloggerCourse.com  Fact Sheet  for Healthcare Providers: SeriousBroker.it  This test is not yet approved or cleared by the [place] and has been authorized for detection and/or diagnosis of SARS-CoV-2 by FDA under an Emergency Use Authorization (EUA). This EUA will remain in effect (meaning this test can be used) for the duration of the COVID-19 declaration under Section 564(b)(1) of the Act, 21 U.S.C. section 360bbb-3(b)(1), unless the authorization is terminated or revoked.     Resp Syncytial Virus by PCR NEGATIVE NEGATIVE Final    Comment: (NOTE) Fact Sheet for Patients: BloggerCourse.com  Fact Sheet for Healthcare Providers: SeriousBroker.it  This test is not yet approved or cleared by the [place] FDA and has been authorized for detection and/or diagnosis of SARS-CoV-2 by FDA under an Emergency Use Authorization (EUA). This EUA will remain in effect (meaning this test can be used) for the duration of the COVID-19 declaration under Section 564(b)(1) of the Act, 21 U.S.C. section 360bbb-3(b)(1), unless the authorization is terminated or revoked.  Performed at [facility], [address] [place], [place] [address]   Expectorated Sputum Assessment w Gram Stain, Rflx to Resp Cult     Status: None   Collection Time: 04/09/23 12:24 PM   Specimen: Sputum  Result Value Ref Range Status   Specimen Description SPUTUM  Final   Special Requests NONE  Final   Sputum evaluation   Final    Sputum specimen not acceptable for testing.  Please recollect.   C/JOAN WILLIS AT  1329 04/09/23.PMF Performed at [facility], [address] [place], [place] [address]    Report Status 04/09/2023 FINAL  Final  Culture, blood (Routine X 2) w Reflex to ID Panel     Status: None (Preliminary result)   Collection Time: 04/10/23  6:10 PM   Specimen: BLOOD  Result Value Ref Range Status   Specimen Description BLOOD RIGHT ANTECUBITAL  Final   Special Requests   Final    BOTTLES DRAWN AEROBIC AND ANAEROBIC Blood Culture adequate volume   Culture   Final    NO GROWTH 4 DAYS Performed at [facility], [address], [place], [place] [address]    Report Status PENDING  Incomplete  Culture, blood (Routine X 2) w Reflex to ID Panel     Status: None (Preliminary result)   Collection Time: 04/10/23  6:10 PM   Specimen: BLOOD RIGHT HAND  Result Value Ref Range Status   Specimen Description BLOOD RIGHT HAND  Final   Special Requests   Final    BOTTLES DRAWN AEROBIC AND ANAEROBIC Blood Culture adequate volume   Culture   Final    NO GROWTH 4 DAYS Performed at [facility], [address] [place], [place] [address]    Report Status PENDING  Incomplete    Coagulation Studies: No results for input(s): "LABPROT", "INR" in the last 72 hours.  Urinalysis: No results for input(s): "COLORURINE", "LABSPEC", "PHURINE", "GLUCOSEU", "HGBUR", "BILIRUBINUR", "KETONESUR", "PROTEINUR", "UROBILINOGEN", "NITRITE", "LEUKOCYTESUR" in the last 72 hours.  Invalid input(s): "APPERANCEUR"     Imaging: ECHOCARDIOGRAM COMPLETE Result Date: 04/13/2023    ECHOCARDIOGRAM REPORT   Patient Name:   BRENDER HEWGLEY Date of Exam: 04/13/2023 Medical Rec #:  [id number]         Height:       62.0 in Accession #:    [id number]        Weight:       87.7 lb Date of Birth:  04/18/1932  BSA:          1.347 m Patient Age:    89 years          BP:           132/62 mmHg Patient Gender: F                 HR:           70 bpm. Exam Location:  [facility] Procedure: 2D Echo, Cardiac  Doppler and Color Doppler Indications:     Bacteremia R78.81  History:         Patient has prior history of Echocardiogram examinations, most                  recent 01/06/2022. Previous Myocardial Infarction; Risk                  Factors:Hypertension and Dyslipidemia. CKD-stage 5.  Sonographer:     Cristela Blue Referring Phys:  [id number] Lynn Ito Diagnosing Phys: Alwyn Pea MD IMPRESSIONS  1. Left ventricular ejection fraction, by estimation, is 60 to 65%. The left ventricle has normal function. The left ventricle has no regional wall motion abnormalities. There is severe asymmetric left ventricular hypertrophy of the septal segment. Left  ventricular diastolic parameters are consistent with Grade I diastolic dysfunction (impaired relaxation).  2. Right ventricular systolic function is normal. The right ventricular size is normal. Mildly increased right ventricular wall thickness.  3. The mitral valve is degenerative. Mild mitral valve regurgitation.  4. The tricuspid valve is degenerative.  5. The aortic valve is calcified. Aortic valve regurgitation is trivial. Aortic valve sclerosis is present, with no evidence of aortic valve stenosis. FINDINGS  Left Ventricle: Left ventricular ejection fraction, by estimation, is 60 to 65%. The left ventricle has normal function. The left ventricle has no regional wall motion abnormalities. The left ventricular internal cavity size was normal in size. There is  severe asymmetric left ventricular hypertrophy of the septal segment. Left ventricular diastolic parameters are consistent with Grade I diastolic dysfunction (impaired relaxation). Right Ventricle: The right ventricular size is normal. Mildly increased right ventricular wall thickness. Right ventricular systolic function is normal. Left Atrium: Left atrial size was normal in size. Right Atrium: Right atrial size was normal in size. Pericardium: There is no evidence of pericardial effusion. Mitral Valve:  The mitral valve is degenerative in appearance. Mild mitral valve regurgitation. MV peak gradient, 10.6 mmHg. The mean mitral valve gradient is 4.0 mmHg. Tricuspid Valve: The tricuspid valve is degenerative in appearance. Tricuspid valve regurgitation is mild. Aortic Valve: The aortic valve is calcified. Aortic valve regurgitation is trivial. Aortic valve sclerosis is present, with no evidence of aortic valve stenosis. Aortic valve mean gradient measures 24.3 mmHg. Aortic valve peak gradient measures 38.5 mmHg. Aortic valve area, by VTI measures 1.13 cm. Pulmonic Valve: The pulmonic valve was normal in structure. Pulmonic valve regurgitation is not visualized. Aorta: The ascending aorta was not well visualized. IAS/Shunts: No atrial level shunt detected by color flow Doppler.  LEFT VENTRICLE PLAX 2D LVIDd:         3.40 cm   Diastology LVIDs:         2.30 cm   LV e' medial:    3.70 cm/s LV PW:         1.30 cm   LV E/e' medial:  28.1 LV IVS:        2.00 cm   LV e' lateral:   4.24 cm/s  LVOT diam:     2.00 cm   LV E/e' lateral: 24.5 LV SV:         79 LV SV Index:   59 LVOT Area:     3.14 cm  RIGHT VENTRICLE RV Basal diam:  2.90 cm RV Mid diam:    2.60 cm RV S prime:     14.00 cm/s TAPSE (M-mode): 2.4 cm LEFT ATRIUM             Index        RIGHT ATRIUM           Index LA diam:        3.90 cm 2.90 cm/m   RA Area:     13.50 cm LA Vol (A2C):   79.7 ml 59.17 ml/m  RA Volume:   37.50 ml  27.84 ml/m LA Vol (A4C):   99.9 ml 74.17 ml/m LA Biplane Vol: 97.0 ml 72.02 ml/m  AORTIC VALVE AV Area (Vmax):    1.07 cm AV Area (Vmean):   1.08 cm AV Area (VTI):     1.13 cm AV Vmax:           310.33 cm/s AV Vmean:          234.000 cm/s AV VTI:            0.703 m AV Peak Grad:      38.5 mmHg AV Mean Grad:      24.3 mmHg LVOT Vmax:         106.00 cm/s LVOT Vmean:        80.500 cm/s LVOT VTI:          0.253 m LVOT/AV VTI ratio: 0.36  AORTA Ao Root diam: 2.70 cm MITRAL VALVE                TRICUSPID VALVE MV Area (PHT): 2.50 cm      TR Peak grad:   14.3 mmHg MV Area VTI:   2.19 cm     TR Vmax:        189.00 cm/s MV Peak grad:  10.6 mmHg MV Mean grad:  4.0 mmHg     SHUNTS MV Vmax:       1.63 m/s     Systemic VTI:  0.25 m MV Vmean:      96.2 cm/s    Systemic Diam: 2.00 cm MV Decel Time: 303 msec MV E velocity: 104.00 cm/s MV A velocity: 143.00 cm/s MV E/A ratio:  0.73 Dwayne D Callwood MD Electronically signed by Alwyn Pea MD Signature Date/Time: 04/13/2023/2:29:10 PM    Final      Medications:     ceFAZolin (ANCEF) IV Stopped (04/12/23 1138)    (feeding supplement) PROSource Plus  30 mL Oral BID BM   brimonidine  1 drop Both Eyes BID   carvedilol  3.125 mg Oral BID   feeding supplement  237 mL Oral TID BM   heparin  5,000 Units Subcutaneous Q12H   multivitamin  1 tablet Oral Daily   nystatin  5 mL Oral QID   pantoprazole  40 mg Oral Daily   sertraline  25 mg Oral Daily   torsemide  100 mg Oral Daily   acetaminophen, ALPRAZolam, heparin, hydrALAZINE, lidocaine-prilocaine, ondansetron **OR** ondansetron (ZOFRAN) IV, pentafluoroprop-tetrafluoroeth, phenol, traZODone  Assessment/ Plan:  [name] is a 89 y.o.  female wth end stage renal disease on hemodialysis, hypertension, congestive heart failure, depression, GERD who presents to [facility] with 04/07/2023 with  Hypokalemia [E87.6] Pericardial effusion [I31.39] Pneumonia [J18.9] Failure to thrive in adult [R62.7] Urinary tract infection without hematuria, site unspecified [N39.0] Community acquired pneumonia, unspecified laterality [J18.9]  [facility] M[facility] Left AVF 43kg  End Stage Renal Disease on hemodialysis: Receiving dialysis today, UF goal 1 to 1.5 L as tolerated.  Next treatment scheduled for Wednesday.  Hypertension with chronic kidney disease: Continue torsemide.  Blood pressure 163/52 during dialysis  Anemia with chronic kidney disease: mircera as outpatient. Hemoglobin 10.6, within optimal range.  Secondary  Hyperparathyroidism: currently holding phosphate binders. Gets parsabiv outpatient.  Bone minerals remain acceptable.  5. Streptococcal bacteremia secondary to UTI. Blood cultures and urine cultures positive for strep anginosus. Receiving IV ceftriaxone. ID consulted and recommends Cefazolin 2g/2g/2g with each dialysis until 05/05/2023. Weekly labs of CBC with diff and CMP requested.   Outpatient clinic has been notified   LOS: 7 Kynsley Whitehouse 12/13/202411:52 AM

## 2023-04-14 NOTE — TOC Progression Note (Signed)
Transition of Care [facility]) - Progression Note    Patient Details  Name: Lori Stanley MRN: [id number] Date of Birth: December 24, 1931  Transition of Care [facility] CM/SW Contact  Marlowe Sax, RN Phone Number: 04/14/2023, 10:48 AM  Clinical Narrative:     Ins pending to go to [facility] [id number]  Expected Discharge Plan: Skilled Nursing Facility Barriers to Discharge: Continued Medical Work up  Expected Discharge Plan and Services       Living arrangements for the past 2 months: Single Family Home                                       Social Determinants of Health (SDOH) Interventions SDOH Screenings   Food Insecurity: No Food Insecurity (04/08/2023)  Housing: Low Risk  (04/08/2023)  Transportation Needs: No Transportation Needs (04/08/2023)  Utilities: Not At Risk (04/08/2023)  Alcohol Screen: Low Risk  (02/02/2023)  Depression (PHQ2-9): Low Risk  (04/06/2023)  Financial Resource Strain: Low Risk  (11/01/2022)  Physical Activity: Inactive (11/01/2022)  Social Connections: Socially Isolated (11/01/2022)  Stress: No Stress Concern Present (11/01/2022)  Tobacco Use: Medium Risk (04/06/2023)    Readmission Risk Interventions    04/09/2023    2:34 PM 07/20/2020    3:45 PM  Readmission Risk Prevention Plan  Transportation Screening Complete Complete  PCP or Specialist Appt within 5-7 Days Complete   PCP or Specialist Appt within 3-5 Days  Complete  Home Care Screening Complete   Medication Review (RN CM) Complete   HRI or Home Care Consult  Complete  Palliative Care Screening  Not Applicable  Medication Review (RN Care Manager)  Complete

## 2023-04-14 NOTE — Assessment & Plan Note (Signed)
 Continue sertraline and trazodone.  Added as needed alprazolam for anxiety.

## 2023-04-14 NOTE — Assessment & Plan Note (Signed)
 Nephrology following, their assistance is appreciated  Patient receiving hemodialysis per her schedule of M-W-F.

## 2023-04-14 NOTE — Assessment & Plan Note (Signed)
No clinical evidence of cardiogenic volume overload Strict input and output monitoring Daily weights Low-sodium diet  

## 2023-04-14 NOTE — Care Management Important Message (Signed)
Important Message  Patient Details  Name: Lori Stanley MRN: [id number] Date of Birth: [birth year]   Important Message Given:  Yes - Medicare IM     [name] 04/14/2023, 10:12 AM

## 2023-04-14 NOTE — Assessment & Plan Note (Signed)
 Continue pantoprazole for antiacid therapy Per ENT recommendations with a short course of prednisone for laryngeal edema seen on laryngoscopy during this hospitalization.  Will likely discontinue at time of discharge.

## 2023-04-14 NOTE — Progress Notes (Signed)
Pt left floor for HD prior to completion of full AM assessment by this RN. This RN to complete assessment, obtain VS, and administer meds once pt returns to floor.

## 2023-04-14 NOTE — Assessment & Plan Note (Signed)
See assessment and plan above

## 2023-04-14 NOTE — Progress Notes (Signed)
kg Received patient in bed to unit.   Informed consent signed and in chart.    TX duration:3 hrs     Transported back to floor  Hand-off given to patient's nurse. No c/o and no distress noted  Pt alarming throughout tx.  Venous and arterial needle pushing/pulling well.  Alaming high venous pressure.  Cartrige removed and changed.  Still alarming.  Pt taken off 1 hr early per NP     Access used: L AVG Access issues:    Total UF removed: 0.1L Medication(s) given: tylenol  Post HD weight:      Lynann Beaver  [facility]

## 2023-04-15 DIAGNOSIS — R7881 Bacteremia: Secondary | ICD-10-CM | POA: Diagnosis not present

## 2023-04-15 DIAGNOSIS — R1310 Dysphagia, unspecified: Secondary | ICD-10-CM | POA: Diagnosis not present

## 2023-04-15 DIAGNOSIS — N186 End stage renal disease: Secondary | ICD-10-CM | POA: Diagnosis not present

## 2023-04-15 DIAGNOSIS — N390 Urinary tract infection, site not specified: Secondary | ICD-10-CM | POA: Diagnosis not present

## 2023-04-15 LAB — CULTURE, BLOOD (ROUTINE X 2)
Culture: NO GROWTH
Culture: NO GROWTH
Special Requests: ADEQUATE
Special Requests: ADEQUATE

## 2023-04-15 MED ORDER — ORAL CARE MOUTH RINSE: 15.0000 mL | OROMUCOSAL | Status: DC | PRN

## 2023-04-15 MED ORDER — CARVEDILOL 3.125 MG PO TABS
6.2500 mg | ORAL_TABLET | Freq: Two times a day (BID) | ORAL | Status: DC
Start: 2023-04-15 — End: 2023-04-18
  Administered 2023-04-15 – 2023-04-18 (×6): 6.25 mg via ORAL
  Filled 2023-04-15 (×7): qty 2

## 2023-04-15 MED ORDER — BOOST / RESOURCE BREEZE PO LIQD CUSTOM
1.0000 | Freq: Three times a day (TID) | ORAL | Status: DC
Start: 2023-04-15 — End: 2023-04-18
  Administered 2023-04-15 – 2023-04-16 (×5): 1 via ORAL

## 2023-04-15 MED ORDER — ORAL CARE MOUTH RINSE
15.0000 mL | OROMUCOSAL | Status: DC
  Administered 2023-04-15 – 2023-04-18 (×10): 15 mL via OROMUCOSAL

## 2023-04-15 NOTE — Progress Notes (Signed)
PROGRESS NOTE   Lori Stanley  ZOX:[id number] DOB: Nov 22, 1931 DOA: 04/07/2023 PCP: [name], MD   Date of Service: the patient was seen and examined on 04/15/2023  Brief Narrative:  89 yo female with the past medical history of heart failure, hypertension, ESRD, and gastritis who presented with difficulty swallowing for 2 to 3 weeks prior to hospitalization with progressive weight loss.  Due to concerns over failure to thrive and volume depletion the hospitalist group was then called to assess the patient for admission to the hospital.  During a thorough evaluation patient was identified to have urinalysis suggestive of urinary tract infection.  Urine culture on 12/5 grew out streptococcus anginosus with additional blood culture drawn on 12/6 growing out same organism.  Of note, urine culture performed on 12/6 grew out Enterococcus faecalis however this was felt to be contaminant.  Patient was treated with intravenous ceftriaxone and infectious disease was consulted.  Infectious disease recommended evaluating for infective endocarditis in the setting of systolic murmur on cardiac examination although due to cervical spine disease a TEE could not be performed.  They recommended a TTE instead.  Concerning the patient's difficulty swallowing Dr. [name] with ENT was consulted and performed a transnasal flexible laryngoscopy revealing some mild edema on laryngoscopy with a short course of systemic steroids recommended.  Patient received hemodialysis per her usual Monday, Wednesday, Friday dialysis treatment schedule while hospitalized.  Throughout this complicated hospitalization physical therapy evaluated the patient and patient felt the patient would benefit from skilled services in a skilled nursing facility.  Plan is for patient to proceed with skilled nurse facility placement once bed is obtained and insurance authorization is complete.   Assessment & Plan Streptococcal  bacteremia Strep anginosus growing out on both urine and blood cultures  Previously treated with intravenous ceftriaxone however now infectious disease is recommending patient be transition to intravenous Ancef to be administered with dialysis over the course of a total of 4 weeks. Enterococcus faecalis growing out of urine on 12/6 felt to be a contaminant.   Infectious disease following, their input is appreciated  TTE performed 12/12 reveals no evidence of infective endocarditis.  Patient unlikely to tolerate TEE due to cervical disease.   Repeat surveillance blood cultures on 12/9 are without growth. UTI (urinary tract infection) See assessment and plan above ESRD (end stage renal disease) [facility] Nephrology following, their assistance is appreciated  Patient receiving hemodialysis per her schedule of M-W-F.   Dysphagia Continue pantoprazole for antiacid therapy Per ENT recommendations with a short course of prednisone for laryngeal edema seen on laryngoscopy during this hospitalization.  Will likely discontinue at time of discharge.  Essential hypertension Relaxed blood pressure targets due to advanced age  Will increase Coreg to 6.25 mg twice daily. As intravenous hydralazine for markedly elevated blood pressure. Type 2 diabetes mellitus without complication, without long-term current use of insulin (HCC) Longstanding uncomplicated diabetes mellitus, supportive care  Last reported hemoglobin A1c of 5.0% in 2023 Chronic diastolic CHF (congestive heart failure) (HCC) No clinical evidence of cardiogenic volume overload Strict input and output monitoring Daily weights Low-sodium diet  Anxiety Continue sertraline and trazodone.  Added as needed alprazolam for anxiety.      Subjective:  Patient currently has no complaints.    Physical Exam:  Vitals:   04/14/23 1510 04/14/23 2130 04/14/23 2132 04/15/23 0750  BP: (!) 127/56 (!) 158/55 (!) 158/55 (!) 152/56  Pulse: 77 80 80 66   Resp: 15 17  16   Temp:  97.8 F (36.6 C) 98.2 F (36.8 C)  97.8 F (36.6 C)  TempSrc:  Oral    SpO2: 98% 100%  93%  Weight:      Height:         Constitutional: Awake alert and oriented x3, no associated distress.   Skin: no rashes, no lesions, good skin turgor noted. Eyes: Pupils are equally reactive to light.  No evidence of scleral icterus or conjunctival pallor.  ENMT: Moist mucous membranes noted.  Posterior pharynx clear of any exudate or lesions.   Respiratory: clear to auscultation bilaterally, no wheezing, no crackles. Normal respiratory effort. No accessory muscle use.  Cardiovascular: Notable 3 out of 6 systolic murmur.  Notably hyperdynamic PMI.  No extremity edema. 2+ pedal pulses. No carotid bruits.  Abdomen: Abdomen is soft and nontender.  No evidence of intra-abdominal masses.  Positive bowel sounds noted in all quadrants.   Musculoskeletal: No joint deformity upper and lower extremities. Good ROM, no contractures. Normal muscle tone.    Data Reviewed:  I have personally reviewed and interpreted labs, imaging.  Significant findings are   CBC: Recent Labs  Lab 04/09/23 0313 04/10/23 0210 04/12/23 0803 04/13/23 0424 04/14/23 0422  WBC 9.5 9.4 7.6 6.3 5.9  NEUTROABS 8.3*  --   --  5.0  --   HGB 11.6* 11.3* 11.4* 11.9* 10.6*  HCT 36.0 36.0 37.0 37.7 33.9*  MCV 86.3 88.2 88.9 87.7 89.0  PLT 197 213 252 230 225   Basic Metabolic Panel: Recent Labs  Lab 04/10/23 0210 04/11/23 0503 04/12/23 0803 04/13/23 0424 04/14/23 0422  NA 136 137 140 136 135  K 3.8 3.4* 4.2 4.0 3.9  CL 99 99 103 98 98  CO2 27 29 29 30 31   GLUCOSE 186* 77 83 70 77  BUN 22 21 28* 13 26*  CREATININE 2.30* 2.09* 2.55* 1.88* 2.70*  CALCIUM 9.1 9.1 9.3 9.3 8.9  MG  --   --  1.9 2.2 1.9  PHOS 4.2  --  3.6  --  3.9   GFR: Estimated Creatinine Clearance: 8.9 mL/min (A) (by C-G formula based on SCr of 2.7 mg/dL (H)). Liver Function Tests: Recent Labs  Lab 04/10/23 0210  04/13/23 0424 04/14/23 0422  AST  --  16 13*  ALT  --  10 6  ALKPHOS  --  161* 138*  BILITOT  --  0.8 0.6  PROT  --  5.8* 5.4*  ALBUMIN 2.6* 2.7* 2.6*    Code Status:  Full code.  Code status decision has been confirmed with: patient    Severity of Illness:  The appropriate patient status for this patient is INPATIENT. Inpatient status is judged to be reasonable and necessary in order to provide the required intensity of service to ensure the patient's safety. The patient's presenting symptoms, physical exam findings, and initial radiographic and laboratory data in the context of their chronic comorbidities is felt to place them at high risk for further clinical deterioration. Furthermore, it is not anticipated that the patient will be medically stable for discharge from the hospital within 2 midnights of admission.   * I certify that at the point of admission it is my clinical judgment that the patient will require inpatient hospital care spanning beyond 2 midnights from the point of admission due to high intensity of service, high risk for further deterioration and high frequency of surveillance required.*  Time spent:  38 minutes  Author:  Marinda Elk MD  04/15/2023 8:17  AM

## 2023-04-15 NOTE — Assessment & Plan Note (Addendum)
Relaxed blood pressure targets due to advanced age  Will increase Coreg to 6.25 mg twice daily. As intravenous hydralazine for markedly elevated blood pressure.

## 2023-04-15 NOTE — Plan of Care (Signed)
  Problem: Education: Goal: Knowledge of General Education information will improve Description: Including pain rating scale, medication(s)/side effects and non-pharmacologic comfort measures Outcome: Progressing   Problem: Clinical Measurements: Goal: Ability to maintain clinical measurements within normal limits will improve Outcome: Progressing Goal: Will remain free from infection Outcome: Progressing Goal: Diagnostic test results will improve Outcome: Progressing Goal: Respiratory complications will improve Outcome: Progressing Goal: Cardiovascular complication will be avoided Outcome: Progressing   Problem: Activity: Goal: Risk for activity intolerance will decrease Outcome: Progressing   Problem: Nutrition: Goal: Adequate nutrition will be maintained Outcome: Progressing   Problem: Coping: Goal: Level of anxiety will decrease Outcome: Progressing   Problem: Elimination: Goal: Will not experience complications related to bowel motility Outcome: Progressing Goal: Will not experience complications related to urinary retention Outcome: Progressing   Problem: Pain Management: Goal: General experience of comfort will improve Outcome: Progressing   Problem: Safety: Goal: Ability to remain free from injury will improve Outcome: Progressing   Problem: Skin Integrity: Goal: Risk for impaired skin integrity will decrease Outcome: Progressing   Problem: Activity: Goal: Ability to tolerate increased activity will improve Outcome: Progressing   Problem: Clinical Measurements: Goal: Ability to maintain a body temperature in the normal range will improve Outcome: Progressing   Problem: Respiratory: Goal: Ability to maintain adequate ventilation will improve Outcome: Progressing Goal: Ability to maintain a clear airway will improve Outcome: Progressing

## 2023-04-15 NOTE — Assessment & Plan Note (Signed)
See assessment and plan above

## 2023-04-15 NOTE — Assessment & Plan Note (Signed)
 Continue pantoprazole for antiacid therapy Per ENT recommendations with a short course of prednisone for laryngeal edema seen on laryngoscopy during this hospitalization.  Will likely discontinue at time of discharge.

## 2023-04-15 NOTE — Plan of Care (Signed)

## 2023-04-15 NOTE — Progress Notes (Signed)
[facility]  ROUNDING NOTE   Subjective:   [name] was admitted to [facility] on 04/07/2023 for Hypokalemia [E87.6] Pericardial effusion [I31.39] Pneumonia [J18.9] Failure to thrive in adult [R62.7] Urinary tract infection without hematuria, site unspecified [N39.0] Community acquired pneumonia, unspecified laterality [J18.9]     Update: Patient seen sitting up in bed eating breakfast, on 2L [place]. Patient denies shortness of breath or pain. Patient in good spirits, hopeful to get discharged soon.  Objective:  Vital signs in last 24 hours:  Temp:  [96.9 F (36.1 C)-98.2 F (36.8 C)] 97.8 F (36.6 C) (12/14 0750) Pulse Rate:  [66-80] 66 (12/14 0750) Resp:  [14-20] 16 (12/14 0750) BP: (127-164)/(52-76) 152/56 (12/14 0750) SpO2:  [81 %-100 %] 93 % (12/14 0750) Weight:  [39.7 kg] 39.7 kg (12/13 1332)  Weight change:  Filed Weights   04/12/23 1201 04/14/23 0905 04/14/23 1332  Weight: 39.8 kg 39.7 kg 39.7 kg    Intake/Output: I/O last 3 completed shifts: In: 660 [P.O.:460; IV Piggyback:200] Out: 100 [Other:100]   Intake/Output this shift:  No intake/output data recorded.  Physical Exam: General: NAD,   Head: Normocephalic, atraumatic. Moist oral mucosal membranes  Eyes: Anicteric, PERRL  Neck: Supple, trachea midline  Lungs:  Clear to auscultation  Heart: Regular rate and rhythm  Abdomen:  Soft, nontender,   Extremities:  No peripheral edema.  Neurologic: Nonfocal, moving all four extremities  Skin: No lesions  Access: L AVG    Basic Metabolic Panel: Recent Labs  Lab 04/10/23 0210 04/11/23 0503 04/12/23 0803 04/13/23 0424 04/14/23 0422  NA 136 137 140 136 135  K 3.8 3.4* 4.2 4.0 3.9  CL 99 99 103 98 98  CO2 27 29 29 30 31   GLUCOSE 186* 77 83 70 77  BUN 22 21 28* 13 26*  CREATININE 2.30* 2.09* 2.55* 1.88* 2.70*  CALCIUM 9.1 9.1 9.3 9.3 8.9  MG  --   --  1.9 2.2 1.9  PHOS 4.2  --  3.6  --  3.9    Liver Function Tests: Recent Labs   Lab 04/10/23 0210 04/13/23 0424 04/14/23 0422  AST  --  16 13*  ALT  --  10 6  ALKPHOS  --  161* 138*  BILITOT  --  0.8 0.6  PROT  --  5.8* 5.4*  ALBUMIN 2.6* 2.7* 2.6*   No results for input(s): "LIPASE", "AMYLASE" in the last 168 hours. No results for input(s): "AMMONIA" in the last 168 hours.  CBC: Recent Labs  Lab 04/09/23 0313 04/10/23 0210 04/12/23 0803 04/13/23 0424 04/14/23 0422  WBC 9.5 9.4 7.6 6.3 5.9  NEUTROABS 8.3*  --   --  5.0  --   HGB 11.6* 11.3* 11.4* 11.9* 10.6*  HCT 36.0 36.0 37.0 37.7 33.9*  MCV 86.3 88.2 88.9 87.7 89.0  PLT 197 213 252 230 225    Cardiac Enzymes: No results for input(s): "CKTOTAL", "CKMB", "CKMBINDEX", "TROPONINI" in the last 168 hours.  BNP: Invalid input(s): "POCBNP"  CBG: No results for input(s): "GLUCAP" in the last 168 hours.  Microbiology: Results for orders placed or performed during the hospital encounter of 04/07/23  Culture, blood (routine x 2)     Status: None   Collection Time: 04/07/23  3:04 AM   Specimen: BLOOD  Result Value Ref Range Status   Specimen Description BLOOD BLOOD RIGHT ARM  Final   Special Requests   Final    BOTTLES DRAWN AEROBIC AND ANAEROBIC Blood Culture  adequate volume   Culture   Final    NO GROWTH 5 DAYS Performed at [facility], [address] [place], [place] [address]    Report Status 04/12/2023 FINAL  Final  Culture, blood (routine x 2)     Status: Abnormal (Preliminary result)   Collection Time: 04/07/23  3:40 AM   Specimen: BLOOD  Result Value Ref Range Status   Specimen Description   Final    BLOOD BLOOD RIGHT FOREARM Performed at [facility], [address] [place], [place] [address]    Special Requests   Final    BOTTLES DRAWN AEROBIC AND ANAEROBIC Blood Culture adequate volume Performed at [facility], [address] [place], [place] [address]    Culture  Setup Time   Final    GRAM POSITIVE COCCI ANAEROBIC BOTTLE ONLY CRITICAL  RESULT CALLED TO, READ BACK BY AND VERIFIED WITH: EMILY STEINBOCK @ 2104 04/07/23 LFD     Culture (A)  Final    STREPTOCOCCUS ANGINOSIS Sent to [facility] for further susceptibility testing. Performed at [facility], [address], [place], [place] [address]    Report Status PENDING  Incomplete   Organism ID, Bacteria STREPTOCOCCUS ANGINOSIS  Final      Susceptibility   Streptococcus anginosis - MIC*    PENICILLIN 0.5 INTERMEDIATE Intermediate     CEFTRIAXONE <=0.12 SENSITIVE Sensitive     ERYTHROMYCIN <=0.12 SENSITIVE Sensitive     VANCOMYCIN <=0.12 SENSITIVE Sensitive     * STREPTOCOCCUS ANGINOSIS  Blood Culture ID Panel (Reflexed)     Status: Abnormal   Collection Time: 04/07/23  3:40 AM  Result Value Ref Range Status   Enterococcus faecalis NOT DETECTED NOT DETECTED Final   Enterococcus Faecium NOT DETECTED NOT DETECTED Final   Listeria monocytogenes NOT DETECTED NOT DETECTED Final   Staphylococcus species NOT DETECTED NOT DETECTED Final   Staphylococcus aureus (BCID) NOT DETECTED NOT DETECTED Final   Staphylococcus epidermidis NOT DETECTED NOT DETECTED Final   Staphylococcus lugdunensis NOT DETECTED NOT DETECTED Final   Streptococcus species DETECTED (A) NOT DETECTED Final    Comment: Not Enterococcus species, Streptococcus agalactiae, Streptococcus pyogenes, or Streptococcus pneumoniae. EMILY STEINBOCK @ 2104 04/07/23 LFD    Streptococcus agalactiae NOT DETECTED NOT DETECTED Final   Streptococcus pneumoniae NOT DETECTED NOT DETECTED Final   Streptococcus pyogenes NOT DETECTED NOT DETECTED Final   A.calcoaceticus-baumannii NOT DETECTED NOT DETECTED Final   Bacteroides fragilis NOT DETECTED NOT DETECTED Final   Enterobacterales NOT DETECTED NOT DETECTED Final   Enterobacter cloacae complex NOT DETECTED NOT DETECTED Final   Escherichia coli NOT DETECTED NOT DETECTED Final   Klebsiella aerogenes NOT DETECTED NOT DETECTED Final   Klebsiella oxytoca NOT DETECTED NOT  DETECTED Final   Klebsiella pneumoniae NOT DETECTED NOT DETECTED Final   Proteus species NOT DETECTED NOT DETECTED Final   Salmonella species NOT DETECTED NOT DETECTED Final   Serratia marcescens NOT DETECTED NOT DETECTED Final   Haemophilus influenzae NOT DETECTED NOT DETECTED Final   Neisseria meningitidis NOT DETECTED NOT DETECTED Final   Pseudomonas aeruginosa NOT DETECTED NOT DETECTED Final   Stenotrophomonas maltophilia NOT DETECTED NOT DETECTED Final   Candida albicans NOT DETECTED NOT DETECTED Final   Candida auris NOT DETECTED NOT DETECTED Final   Candida glabrata NOT DETECTED NOT DETECTED Final   Candida krusei NOT DETECTED NOT DETECTED Final   Candida parapsilosis NOT DETECTED NOT DETECTED Final   Candida tropicalis NOT DETECTED NOT DETECTED Final  Cryptococcus neoformans/gattii NOT DETECTED NOT DETECTED Final    Comment: Performed at [facility], [address] [place], [place] [address]  Urine Culture     Status: Abnormal   Collection Time: 04/07/23  3:53 AM   Specimen: In/Out Cath Urine  Result Value Ref Range Status   Specimen Description   Final    IN/OUT CATH URINE Performed at [facility], [address] [place], [place] [address]    Special Requests   Final    NONE Performed at [facility], [address] [place], [place] [address]    Culture (A)  Final    3,000 COLONIES/mL ENTEROCOCCUS FAECALIS >=100,000 COLONIES/mL VIRIDANS STREPTOCOCCUS    Report Status 04/10/2023 FINAL  Final   Organism ID, Bacteria ENTEROCOCCUS FAECALIS (A)  Final      Susceptibility   Enterococcus faecalis - MIC*    AMPICILLIN <=2 SENSITIVE Sensitive     VANCOMYCIN 1 SENSITIVE Sensitive     NITROFURANTOIN <=16 SENSITIVE Sensitive     * 3,000 COLONIES/mL ENTEROCOCCUS FAECALIS  Resp panel by RT-PCR (RSV, Flu A&B, Covid) Anterior Nasal Swab     Status: None   Collection Time: 04/07/23  3:53 AM   Specimen: Anterior Nasal Swab  Result Value Ref Range  Status   SARS Coronavirus 2 by RT PCR NEGATIVE NEGATIVE Final    Comment: (NOTE) SARS-CoV-2 target nucleic acids are NOT DETECTED.  The SARS-CoV-2 RNA is generally detectable in upper respiratory specimens during the acute phase of infection. The lowest concentration of SARS-CoV-2 viral copies this assay can detect is 138 copies/mL. A negative result does not preclude SARS-Cov-2 infection and should not be used as the sole basis for treatment or other patient management decisions. A negative result may occur with  improper specimen collection/handling, submission of specimen other than nasopharyngeal swab, presence of viral mutation(s) within the areas targeted by this assay, and inadequate number of viral copies(<138 copies/mL). A negative result must be combined with clinical observations, patient history, and epidemiological information. The expected result is Negative.  Fact Sheet for Patients:  BloggerCourse.com  Fact Sheet for Healthcare Providers:  SeriousBroker.it  This test is no t yet approved or cleared by the [place] FDA and  has been authorized for detection and/or diagnosis of SARS-CoV-2 by FDA under an Emergency Use Authorization (EUA). This EUA will remain  in effect (meaning this test can be used) for the duration of the COVID-19 declaration under Section 564(b)(1) of the Act, 21 U.S.C.section 360bbb-3(b)(1), unless the authorization is terminated  or revoked sooner.       Influenza A by PCR NEGATIVE NEGATIVE Final   Influenza B by PCR NEGATIVE NEGATIVE Final    Comment: (NOTE) The Xpert Xpress SARS-CoV-2/FLU/RSV plus assay is intended as an aid in the diagnosis of influenza from Nasopharyngeal swab specimens and should not be used as a sole basis for treatment. Nasal washings and aspirates are unacceptable for Xpert Xpress SARS-CoV-2/FLU/RSV testing.  Fact Sheet for  Patients: BloggerCourse.com  Fact Sheet for Healthcare Providers: SeriousBroker.it  This test is not yet approved or cleared by the [place] FDA and has been authorized for detection and/or diagnosis of SARS-CoV-2 by FDA under an Emergency Use Authorization (EUA). This EUA will remain in effect (meaning this test can be used) for the duration of the COVID-19 declaration under Section 564(b)(1) of the Act, 21 U.S.C. section 360bbb-3(b)(1), unless the authorization is terminated or revoked.     Resp Syncytial Virus by PCR  NEGATIVE NEGATIVE Final    Comment: (NOTE) Fact Sheet for Patients: BloggerCourse.com  Fact Sheet for Healthcare Providers: SeriousBroker.it  This test is not yet approved or cleared by the [place] FDA and has been authorized for detection and/or diagnosis of SARS-CoV-2 by FDA under an Emergency Use Authorization (EUA). This EUA will remain in effect (meaning this test can be used) for the duration of the COVID-19 declaration under Section 564(b)(1) of the Act, 21 U.S.C. section 360bbb-3(b)(1), unless the authorization is terminated or revoked.  Performed at [facility], [address] [place], [place] [address]   Expectorated Sputum Assessment w Gram Stain, Rflx to Resp Cult     Status: None   Collection Time: 04/09/23 12:24 PM   Specimen: Sputum  Result Value Ref Range Status   Specimen Description SPUTUM  Final   Special Requests NONE  Final   Sputum evaluation   Final    Sputum specimen not acceptable for testing.  Please recollect.   C/JOAN WILLIS AT 1329 04/09/23.PMF Performed at [facility], [address] [place], [place] [address]    Report Status 04/09/2023 FINAL  Final  Culture, blood (Routine X 2) w Reflex to ID Panel     Status: None   Collection Time: 04/10/23  6:10 PM   Specimen: BLOOD  Result Value Ref  Range Status   Specimen Description BLOOD RIGHT ANTECUBITAL  Final   Special Requests   Final    BOTTLES DRAWN AEROBIC AND ANAEROBIC Blood Culture adequate volume   Culture   Final    NO GROWTH 5 DAYS Performed at [facility], [address] [place], [place] [address]    Report Status 04/15/2023 FINAL  Final  Culture, blood (Routine X 2) w Reflex to ID Panel     Status: None   Collection Time: 04/10/23  6:10 PM   Specimen: BLOOD RIGHT HAND  Result Value Ref Range Status   Specimen Description BLOOD RIGHT HAND  Final   Special Requests   Final    BOTTLES DRAWN AEROBIC AND ANAEROBIC Blood Culture adequate volume   Culture   Final    NO GROWTH 5 DAYS Performed at [facility], [address] [place], [place] [address]    Report Status 04/15/2023 FINAL  Final    Coagulation Studies: No results for input(s): "LABPROT", "INR" in the last 72 hours.  Urinalysis: No results for input(s): "COLORURINE", "LABSPEC", "PHURINE", "GLUCOSEU", "HGBUR", "BILIRUBINUR", "KETONESUR", "PROTEINUR", "UROBILINOGEN", "NITRITE", "LEUKOCYTESUR" in the last 72 hours.  Invalid input(s): "APPERANCEUR"    Imaging: No results found.   Medications:     ceFAZolin (ANCEF) IV Stopped (04/14/23 1727)    (feeding supplement) PROSource Plus  30 mL Oral BID BM   brimonidine  1 drop Both Eyes BID   carvedilol  6.25 mg Oral BID   Chlorhexidine Gluconate Cloth  6 each Topical Daily   feeding supplement  237 mL Oral TID BM   heparin  5,000 Units Subcutaneous Q12H   multivitamin  1 tablet Oral Daily   nystatin  5 mL Oral QID   mouth rinse  15 mL Mouth Rinse 4 times per day   pantoprazole  40 mg Oral Daily   sertraline  25 mg Oral Daily   torsemide  100 mg Oral Daily   acetaminophen, ALPRAZolam, hydrALAZINE, ondansetron **OR** ondansetron (ZOFRAN) IV, mouth rinse, phenol, traZODone  Assessment/ Plan:  [name] is a 89 y.o.  female wth end stage renal disease  on  hemodialysis, hypertension, congestive heart failure, depression, GERD who presents to [facility] with 04/07/2023 with Hypokalemia [E87.6] Pericardial effusion [I31.39] Pneumonia [J18.9] Failure to thrive in adult [R62.7] Urinary tract infection without hematuria, site unspecified [N39.0] Community acquired pneumonia, unspecified laterality [J18.9]   [facility] M[facility] Left AVF 43kg   End Stage Renal Disease on hemodialysis: Received HD 12/13 removed. Next tx Monday   Hypertension with chronic kidney disease: Continue torsemide.  Blood pressure 152/56   Anemia with chronic kidney disease: mircera as outpatient. Hemoglobin 10.6, within optimal range.   Secondary Hyperparathyroidism: currently holding phosphate binders. Gets parsabiv outpatient.  Bone minerals remain acceptable.   5. Streptococcal bacteremia secondary to UTI. Blood cultures and urine cultures positive for strep anginosus. Receiving IV ceftriaxone. ID consulted and recommends Cefazolin 2g/2g/2g with each dialysis until 05/05/2023. Weekly labs of CBC with diff and CMP requested.              Outpatient clinic has been notified   LOS: 8 Suhaylah Wampole P Mihir Flanigan 12/14/202411:24 AM

## 2023-04-15 NOTE — Assessment & Plan Note (Signed)
 Nephrology following, their assistance is appreciated  Patient receiving hemodialysis per her schedule of M-W-F.

## 2023-04-15 NOTE — Assessment & Plan Note (Signed)
No clinical evidence of cardiogenic volume overload Strict input and output monitoring Daily weights Low-sodium diet  

## 2023-04-15 NOTE — Assessment & Plan Note (Signed)
 Continue sertraline and trazodone.  Added as needed alprazolam for anxiety.

## 2023-04-15 NOTE — Consult Note (Signed)
PHARMACY CONSULT NOTE - ELECTROLYTES  Pharmacy Consult for Electrolyte Monitoring and Replacement   Recent Labs: Height: 5\' 2"  (157.5 cm) Weight: 39.7 kg (87 lb 8.4 oz) IBW/kg (Calculated) : 50.1 Estimated Creatinine Clearance: 8.9 mL/min (A) (by C-G formula based on SCr of 2.7 mg/dL (H)).  Potassium (mmol/L)  Date Value  04/14/2023 3.9  05/15/2013 4.0   Magnesium (mg/dL)  Date Value  16/01/9603 1.9   Calcium (mg/dL)  Date Value  54/12/8117 8.9   Calcium, Total (mg/dL)  Date Value  14/78/2956 8.0 (L)   Albumin (g/dL)  Date Value  21/30/8657 2.6 (L)  04/06/2023 3.8  05/11/2013 3.6   Phosphorus (mg/dL)  Date Value  84/69/6295 3.9   Sodium (mmol/L)  Date Value  04/14/2023 135  04/06/2023 141  05/15/2013 132 (L)   Assessment  Lori Stanley is a 89 y.o. female presenting with failure to thrive. Pharmacy has been consulted to monitor and replace electrolytes. ESRD on hemodialysis    Pertinent medications: torsemide 100 mg po daily  Goal of Therapy: Electrolytes WNL  Plan:  No replacement needed F/u with AM labs.   Thank you for allowing pharmacy to be a part of this patient's care.  [name], PharmD 04/15/2023 8:10 AM

## 2023-04-15 NOTE — Assessment & Plan Note (Signed)
Strep anginosus growing out on both urine and blood cultures  Previously treated with intravenous ceftriaxone however now infectious disease is recommending patient be transition to intravenous Ancef to be administered with dialysis over the course of a total of 4 weeks. Enterococcus faecalis growing out of urine on 12/6 felt to be a contaminant.   Infectious disease following, their input is appreciated  TTE performed 12/12 reveals no evidence of infective endocarditis.  Patient unlikely to tolerate TEE due to cervical disease.   Repeat surveillance blood cultures on 12/9 are without growth.

## 2023-04-15 NOTE — TOC Progression Note (Signed)
Transition of Care [facility]) - Progression Note    Patient Details  Name: NANIE HUMFLEET MRN: [id number] Date of Birth: 02-22-1932  [facility] CM/SW Contact  Liliana Cline, LCSW Phone Number: 04/15/2023, 9:29 AM  Clinical Narrative:    [facility] is still pending in [facility].    Expected Discharge Plan: Skilled Nursing Facility Barriers to Discharge: Continued Medical Work up  Expected Discharge Plan and Services       Living arrangements for the past 2 months: Single Family Home                                       Social Determinants of Health (SDOH) Interventions SDOH Screenings   Food Insecurity: No Food Insecurity (04/08/2023)  Housing: Low Risk  (04/15/2023)  Transportation Needs: No Transportation Needs (04/08/2023)  Utilities: Not At Risk (04/08/2023)  Alcohol Screen: Low Risk  (02/02/2023)  Depression (PHQ2-9): Low Risk  (04/06/2023)  Financial Resource Strain: Low Risk  (11/01/2022)  Physical Activity: Inactive (11/01/2022)  Social Connections: Socially Isolated (11/01/2022)  Stress: No Stress Concern Present (11/01/2022)  Tobacco Use: Medium Risk (04/06/2023)    Readmission Risk Interventions    04/09/2023    2:34 PM  Readmission Risk Prevention Plan  Transportation Screening Complete  PCP or Specialist Appt within 5-7 Days Complete  Home Care Screening Complete  Medication Review (RN CM) Complete

## 2023-04-15 NOTE — Assessment & Plan Note (Signed)
 Longstanding uncomplicated diabetes mellitus, supportive care  Last reported hemoglobin A1c of 5.0% in 2023

## 2023-04-16 ENCOUNTER — Inpatient Hospital Stay: Payer: 59

## 2023-04-16 DIAGNOSIS — N390 Urinary tract infection, site not specified: Secondary | ICD-10-CM | POA: Diagnosis not present

## 2023-04-16 DIAGNOSIS — R7881 Bacteremia: Secondary | ICD-10-CM | POA: Diagnosis not present

## 2023-04-16 DIAGNOSIS — N186 End stage renal disease: Secondary | ICD-10-CM | POA: Diagnosis not present

## 2023-04-16 DIAGNOSIS — R1310 Dysphagia, unspecified: Secondary | ICD-10-CM | POA: Diagnosis not present

## 2023-04-16 LAB — CBC
HCT: 40.3 % (ref 36.0–46.0)
Hemoglobin: 12.6 g/dL (ref 12.0–15.0)
MCH: 27.5 pg (ref 26.0–34.0)
MCHC: 31.3 g/dL (ref 30.0–36.0)
MCV: 88 fL (ref 80.0–100.0)
Platelets: 222 10*3/uL (ref 150–400)
RBC: 4.58 MIL/uL (ref 3.87–5.11)
RDW: 14.8 % (ref 11.5–15.5)
WBC: 6.4 10*3/uL (ref 4.0–10.5)
nRBC: 0 % (ref 0.0–0.2)

## 2023-04-16 LAB — BASIC METABOLIC PANEL
Anion gap: 10 (ref 5–15)
BUN: 43 mg/dL — ABNORMAL HIGH (ref 8–23)
CO2: 28 mmol/L (ref 22–32)
Calcium: 8.9 mg/dL (ref 8.9–10.3)
Chloride: 100 mmol/L (ref 98–111)
Creatinine, Ser: 3.06 mg/dL — ABNORMAL HIGH (ref 0.44–1.00)
GFR, Estimated: 14 mL/min — ABNORMAL LOW (ref >60)
Glucose, Bld: 88 mg/dL (ref 70–99)
Potassium: 3.9 mmol/L (ref 3.5–5.1)
Sodium: 138 mmol/L (ref 135–145)

## 2023-04-16 LAB — PHOSPHORUS: Phosphorus: 4.2 mg/dL (ref 2.5–4.6)

## 2023-04-16 MED ORDER — LIDOCAINE HCL (PF) 1 % IJ SOLN: 5.0000 mL | INTRAMUSCULAR | Status: DC | PRN

## 2023-04-16 MED ORDER — ANTICOAGULANT SODIUM CITRATE 4% (200MG/5ML) IV SOLN: 5.0000 mL | Status: DC | PRN

## 2023-04-16 MED ORDER — ALTEPLASE 2 MG IJ SOLR: 2.0000 mg | Freq: Once | INTRAMUSCULAR | Status: DC | PRN

## 2023-04-16 MED ORDER — LIDOCAINE-PRILOCAINE 2.5-2.5 % EX CREA: 1.0000 | TOPICAL_CREAM | CUTANEOUS | Status: DC | PRN

## 2023-04-16 MED ORDER — PENTAFLUOROPROP-TETRAFLUOROETH EX AERO: 1.0000 | INHALATION_SPRAY | CUTANEOUS | Status: DC | PRN

## 2023-04-16 MED ORDER — HEPARIN SODIUM (PORCINE) 1000 UNIT/ML DIALYSIS: 1000.0000 [IU] | INTRAMUSCULAR | Status: DC | PRN

## 2023-04-16 NOTE — Assessment & Plan Note (Signed)
 Longstanding uncomplicated diabetes mellitus, supportive care  Last reported hemoglobin A1c of 5.0% in 2023

## 2023-04-16 NOTE — Progress Notes (Signed)
[facility]  ROUNDING NOTE   Subjective:   Lori Stanley was admitted to [facility] on 04/07/2023 for Hypokalemia [E87.6] Pericardial effusion [I31.39] Pneumonia [J18.9] Failure to thrive in adult [R62.7] Urinary tract infection without hematuria, site unspecified [N39.0] Community acquired pneumonia, unspecified laterality [J18.9]  Update:  Patient sitting up in bed eating food. Denies shortness of breath, denies pain. Endorses improved appetite and ease of eating due to no pain with swallowing.   Objective:  Vital signs in last 24 hours:  Temp:  [98 F (36.7 C)-98.3 F (36.8 C)] 98.3 F (36.8 C) (12/15 0822) Pulse Rate:  [76-81] 76 (12/15 0822) Resp:  [15-16] 16 (12/15 0822) BP: (147-152)/(59-63) 151/59 (12/15 0822) SpO2:  [99 %-100 %] 100 % (12/15 0822)  Weight change:  Filed Weights   04/12/23 1201 04/14/23 0905 04/14/23 1332  Weight: 39.8 kg 39.7 kg 39.7 kg    Intake/Output: I/O last 3 completed shifts: In: 580 [P.O.:580] Out: 1 [Stool:1]   Intake/Output this shift:  Total I/O In: 320 [P.O.:320] Out: -   Physical Exam: General: NAD,   Head: Normocephalic, atraumatic. Moist oral mucosal membranes  Eyes: Anicteric, PERRL  Neck: Supple, trachea midline  Lungs:  Clear to auscultation  Heart: Regular rate and rhythm  Abdomen:  Soft, nontender,   Extremities:  No peripheral edema.  Neurologic: Nonfocal, moving all four extremities  Skin: No lesions  Access: Lt AVG    Basic Metabolic Panel: Recent Labs  Lab 04/10/23 0210 04/11/23 0503 04/12/23 0803 04/13/23 0424 04/14/23 0422 04/16/23 0446 04/16/23 1022  NA 136 137 140 136 135 138  --   K 3.8 3.4* 4.2 4.0 3.9 3.9  --   CL 99 99 103 98 98 100  --   CO2 27 29 29 30 31 28   --   GLUCOSE 186* 77 83 70 77 88  --   BUN 22 21 28* 13 26* 43*  --   CREATININE 2.30* 2.09* 2.55* 1.88* 2.70* 3.06*  --   CALCIUM 9.1 9.1 9.3 9.3 8.9 8.9  --   MG  --   --  1.9 2.2 1.9  --   --   PHOS 4.2  --   3.6  --  3.9  --  4.2    Liver Function Tests: Recent Labs  Lab 04/10/23 0210 04/13/23 0424 04/14/23 0422  AST  --  16 13*  ALT  --  10 6  ALKPHOS  --  161* 138*  BILITOT  --  0.8 0.6  PROT  --  5.8* 5.4*  ALBUMIN 2.6* 2.7* 2.6*   No results for input(s): "LIPASE", "AMYLASE" in the last 168 hours. No results for input(s): "AMMONIA" in the last 168 hours.  CBC: Recent Labs  Lab 04/10/23 0210 04/12/23 0803 04/13/23 0424 04/14/23 0422 04/16/23 1022  WBC 9.4 7.6 6.3 5.9 6.4  NEUTROABS  --   --  5.0  --   --   HGB 11.3* 11.4* 11.9* 10.6* 12.6  HCT 36.0 37.0 37.7 33.9* 40.3  MCV 88.2 88.9 87.7 89.0 88.0  PLT 213 252 230 225 222    Cardiac Enzymes: No results for input(s): "CKTOTAL", "CKMB", "CKMBINDEX", "TROPONINI" in the last 168 hours.  BNP: Invalid input(s): "POCBNP"  CBG: No results for input(s): "GLUCAP" in the last 168 hours.  Microbiology: Results for orders placed or performed during the hospital encounter of 04/07/23  Culture, blood (routine x 2)     Status: None   Collection Time: 04/07/23  3:04 AM   Specimen: BLOOD  Result Value Ref Range Status   Specimen Description BLOOD BLOOD RIGHT ARM  Final   Special Requests   Final    BOTTLES DRAWN AEROBIC AND ANAEROBIC Blood Culture adequate volume   Culture   Final    NO GROWTH 5 DAYS Performed at [facility], [address] [place], [place] [address]    Report Status 04/12/2023 FINAL  Final  Culture, blood (routine x 2)     Status: Abnormal (Preliminary result)   Collection Time: 04/07/23  3:40 AM   Specimen: BLOOD  Result Value Ref Range Status   Specimen Description   Final    BLOOD BLOOD RIGHT FOREARM Performed at [facility], [address] [place], [place] [address]    Special Requests   Final    BOTTLES DRAWN AEROBIC AND ANAEROBIC Blood Culture adequate volume Performed at [facility], [address] [place], [place] [address]    Culture  Setup Time   Final     GRAM POSITIVE COCCI ANAEROBIC BOTTLE ONLY CRITICAL RESULT CALLED TO, READ BACK BY AND VERIFIED WITH: EMILY STEINBOCK @ 2104 04/07/23 LFD     Culture (A)  Final    STREPTOCOCCUS ANGINOSIS Sent to [facility] for further susceptibility testing. Performed at [facility], [address] [place], [place] [address]    Report Status PENDING  Incomplete   Organism ID, Bacteria STREPTOCOCCUS ANGINOSIS  Final      Susceptibility   Streptococcus anginosis - MIC*    PENICILLIN 0.5 INTERMEDIATE Intermediate     CEFTRIAXONE <=0.12 SENSITIVE Sensitive     ERYTHROMYCIN <=0.12 SENSITIVE Sensitive     VANCOMYCIN <=0.12 SENSITIVE Sensitive     * STREPTOCOCCUS ANGINOSIS  Blood Culture ID Panel (Reflexed)     Status: Abnormal   Collection Time: 04/07/23  3:40 AM  Result Value Ref Range Status   Enterococcus faecalis NOT DETECTED NOT DETECTED Final   Enterococcus Faecium NOT DETECTED NOT DETECTED Final   Listeria monocytogenes NOT DETECTED NOT DETECTED Final   Staphylococcus species NOT DETECTED NOT DETECTED Final   Staphylococcus aureus (BCID) NOT DETECTED NOT DETECTED Final   Staphylococcus epidermidis NOT DETECTED NOT DETECTED Final   Staphylococcus lugdunensis NOT DETECTED NOT DETECTED Final   Streptococcus species DETECTED (A) NOT DETECTED Final    Comment: Not Enterococcus species, Streptococcus agalactiae, Streptococcus pyogenes, or Streptococcus pneumoniae. EMILY STEINBOCK @ 2104 04/07/23 LFD    Streptococcus agalactiae NOT DETECTED NOT DETECTED Final   Streptococcus pneumoniae NOT DETECTED NOT DETECTED Final   Streptococcus pyogenes NOT DETECTED NOT DETECTED Final   A.calcoaceticus-baumannii NOT DETECTED NOT DETECTED Final   Bacteroides fragilis NOT DETECTED NOT DETECTED Final   Enterobacterales NOT DETECTED NOT DETECTED Final   Enterobacter cloacae complex NOT DETECTED NOT DETECTED Final   Escherichia coli NOT DETECTED NOT DETECTED Final   Klebsiella aerogenes NOT DETECTED NOT  DETECTED Final   Klebsiella oxytoca NOT DETECTED NOT DETECTED Final   Klebsiella pneumoniae NOT DETECTED NOT DETECTED Final   Proteus species NOT DETECTED NOT DETECTED Final   Salmonella species NOT DETECTED NOT DETECTED Final   Serratia marcescens NOT DETECTED NOT DETECTED Final   Haemophilus influenzae NOT DETECTED NOT DETECTED Final   Neisseria meningitidis NOT DETECTED NOT DETECTED Final   Pseudomonas aeruginosa NOT DETECTED NOT DETECTED Final   Stenotrophomonas maltophilia NOT DETECTED NOT DETECTED Final   Candida albicans NOT DETECTED NOT DETECTED Final   Candida auris NOT DETECTED NOT DETECTED  Final   Candida glabrata NOT DETECTED NOT DETECTED Final   Candida krusei NOT DETECTED NOT DETECTED Final   Candida parapsilosis NOT DETECTED NOT DETECTED Final   Candida tropicalis NOT DETECTED NOT DETECTED Final   Cryptococcus neoformans/gattii NOT DETECTED NOT DETECTED Final    Comment: Performed at [facility], [address], [place], [place] [address]  Urine Culture     Status: Abnormal   Collection Time: 04/07/23  3:53 AM   Specimen: In/Out Cath Urine  Result Value Ref Range Status   Specimen Description   Final    IN/OUT CATH URINE Performed at [facility], [address] [place], [place] [address]    Special Requests   Final    NONE Performed at [facility], [address] [place], [place] [address]    Culture (A)  Final    3,000 COLONIES/mL ENTEROCOCCUS FAECALIS >=100,000 COLONIES/mL VIRIDANS STREPTOCOCCUS    Report Status 04/10/2023 FINAL  Final   Organism ID, Bacteria ENTEROCOCCUS FAECALIS (A)  Final      Susceptibility   Enterococcus faecalis - MIC*    AMPICILLIN <=2 SENSITIVE Sensitive     VANCOMYCIN 1 SENSITIVE Sensitive     NITROFURANTOIN <=16 SENSITIVE Sensitive     * 3,000 COLONIES/mL ENTEROCOCCUS FAECALIS  Resp panel by RT-PCR (RSV, Flu A&B, Covid) Anterior Nasal Swab     Status: None   Collection Time: 04/07/23  3:53 AM    Specimen: Anterior Nasal Swab  Result Value Ref Range Status   SARS Coronavirus 2 by RT PCR NEGATIVE NEGATIVE Final    Comment: (NOTE) SARS-CoV-2 target nucleic acids are NOT DETECTED.  The SARS-CoV-2 RNA is generally detectable in upper respiratory specimens during the acute phase of infection. The lowest concentration of SARS-CoV-2 viral copies this assay can detect is 138 copies/mL. A negative result does not preclude SARS-Cov-2 infection and should not be used as the sole basis for treatment or other patient management decisions. A negative result may occur with  improper specimen collection/handling, submission of specimen other than nasopharyngeal swab, presence of viral mutation(s) within the areas targeted by this assay, and inadequate number of viral copies(<138 copies/mL). A negative result must be combined with clinical observations, patient history, and epidemiological information. The expected result is Negative.  Fact Sheet for Patients:  BloggerCourse.com  Fact Sheet for Healthcare Providers:  SeriousBroker.it  This test is no t yet approved or cleared by the [place] FDA and  has been authorized for detection and/or diagnosis of SARS-CoV-2 by FDA under an Emergency Use Authorization (EUA). This EUA will remain  in effect (meaning this test can be used) for the duration of the COVID-19 declaration under Section 564(b)(1) of the Act, 21 U.S.C.section 360bbb-3(b)(1), unless the authorization is terminated  or revoked sooner.       Influenza A by PCR NEGATIVE NEGATIVE Final   Influenza B by PCR NEGATIVE NEGATIVE Final    Comment: (NOTE) The Xpert Xpress SARS-CoV-2/FLU/RSV plus assay is intended as an aid in the diagnosis of influenza from Nasopharyngeal swab specimens and should not be used as a sole basis for treatment. Nasal washings and aspirates are unacceptable for Xpert Xpress  SARS-CoV-2/FLU/RSV testing.  Fact Sheet for Patients: BloggerCourse.com  Fact Sheet for Healthcare Providers: SeriousBroker.it  This test is not yet approved or cleared by the [place] FDA and has been authorized for detection and/or diagnosis of SARS-CoV-2 by FDA under an Emergency Use Authorization (EUA). This EUA will remain in effect (  meaning this test can be used) for the duration of the COVID-19 declaration under Section 564(b)(1) of the Act, 21 U.S.C. section 360bbb-3(b)(1), unless the authorization is terminated or revoked.     Resp Syncytial Virus by PCR NEGATIVE NEGATIVE Final    Comment: (NOTE) Fact Sheet for Patients: BloggerCourse.com  Fact Sheet for Healthcare Providers: SeriousBroker.it  This test is not yet approved or cleared by the [place] FDA and has been authorized for detection and/or diagnosis of SARS-CoV-2 by FDA under an Emergency Use Authorization (EUA). This EUA will remain in effect (meaning this test can be used) for the duration of the COVID-19 declaration under Section 564(b)(1) of the Act, 21 U.S.C. section 360bbb-3(b)(1), unless the authorization is terminated or revoked.  Performed at [facility], [address] [place], [place] [address]   Expectorated Sputum Assessment w Gram Stain, Rflx to Resp Cult     Status: None   Collection Time: 04/09/23 12:24 PM   Specimen: Sputum  Result Value Ref Range Status   Specimen Description SPUTUM  Final   Special Requests NONE  Final   Sputum evaluation   Final    Sputum specimen not acceptable for testing.  Please recollect.   C/JOAN WILLIS AT 1329 04/09/23.PMF Performed at [facility], [address] [place], [place] [address]    Report Status 04/09/2023 FINAL  Final  Culture, blood (Routine X 2) w Reflex to ID Panel     Status: None   Collection Time: 04/10/23   6:10 PM   Specimen: BLOOD  Result Value Ref Range Status   Specimen Description BLOOD RIGHT ANTECUBITAL  Final   Special Requests   Final    BOTTLES DRAWN AEROBIC AND ANAEROBIC Blood Culture adequate volume   Culture   Final    NO GROWTH 5 DAYS Performed at [facility], [address], [place], [place] [address]    Report Status 04/15/2023 FINAL  Final  Culture, blood (Routine X 2) w Reflex to ID Panel     Status: None   Collection Time: 04/10/23  6:10 PM   Specimen: BLOOD RIGHT HAND  Result Value Ref Range Status   Specimen Description BLOOD RIGHT HAND  Final   Special Requests   Final    BOTTLES DRAWN AEROBIC AND ANAEROBIC Blood Culture adequate volume   Culture   Final    NO GROWTH 5 DAYS Performed at [facility], [address] [place], [place] [address]    Report Status 04/15/2023 FINAL  Final    Coagulation Studies: No results for input(s): "LABPROT", "INR" in the last 72 hours.  Urinalysis: No results for input(s): "COLORURINE", "LABSPEC", "PHURINE", "GLUCOSEU", "HGBUR", "BILIRUBINUR", "KETONESUR", "PROTEINUR", "UROBILINOGEN", "NITRITE", "LEUKOCYTESUR" in the last 72 hours.  Invalid input(s): "APPERANCEUR"    Imaging: No results found.   Medications:    anticoagulant sodium citrate      ceFAZolin (ANCEF) IV Stopped (04/14/23 1727)    (feeding supplement) PROSource Plus  30 mL Oral BID BM   brimonidine  1 drop Both Eyes BID   carvedilol  6.25 mg Oral BID   Chlorhexidine Gluconate Cloth  6 each Topical Daily   feeding supplement  1 Container Oral TID BM   heparin  5,000 Units Subcutaneous Q12H   multivitamin  1 tablet Oral Daily   nystatin  5 mL Oral QID   mouth rinse  15 mL Mouth Rinse 4 times per day   pantoprazole  40 mg Oral Daily   sertraline  25 mg Oral Daily   torsemide  100 mg Oral Daily   acetaminophen, ALPRAZolam, alteplase, anticoagulant sodium citrate, heparin, hydrALAZINE, lidocaine (PF), lidocaine-prilocaine,  ondansetron **OR** ondansetron (ZOFRAN) IV, mouth rinse, pentafluoroprop-tetrafluoroeth, phenol, traZODone  Assessment/ Plan:  Ms. TYKEIA RODENBECK is a 89 y.o.  female wth end stage renal disease on hemodialysis, hypertension, congestive heart failure, depression, GERD who presents to [facility] with 04/07/2023 with Hypokalemia [E87.6] Pericardial effusion [I31.39] Pneumonia [J18.9] Failure to thrive in adult [R62.7] Urinary tract infection without hematuria, site unspecified [N39.0] Community acquired pneumonia, unspecified laterality [J18.9]   [facility] Left AVF 43kg   End Stage Renal Disease on hemodialysis: Received HD 12/13 removed. Plan for HD Monday   Hypertension with chronic kidney disease: Continue torsemide.  Blood pressure 151/59   Anemia with chronic kidney disease: mircera as outpatient. Hemoglobin 11.9, within optimal range.   Secondary Hyperparathyroidism: currently holding phosphate binders. Gets parsabiv outpatient.  Bone minerals remain acceptable.   5. Streptococcal bacteremia secondary to UTI. Blood cultures and urine cultures positive for strep anginosus. Receiving IV ceftriaxone. ID consulted and recommends Cefazolin 2g/2g/2g with each dialysis until 05/05/2023. Weekly labs of CBC with diff and CMP requested.              Outpatient clinic has been notified     LOS: 9 Nishaan Stanke P Raneisha Bress 12/15/202412:10 PM

## 2023-04-16 NOTE — Plan of Care (Signed)

## 2023-04-16 NOTE — Assessment & Plan Note (Addendum)
Relaxed blood pressure targets due to advanced age  Improved blood pressures with recently increased Coreg to 6.25 mg twice daily. As intravenous hydralazine for markedly elevated blood pressure.

## 2023-04-16 NOTE — Plan of Care (Signed)

## 2023-04-16 NOTE — Assessment & Plan Note (Signed)
 Continue sertraline and trazodone.  Added as needed alprazolam for anxiety.

## 2023-04-16 NOTE — TOC Progression Note (Addendum)
Transition of Care [facility]) - Progression Note    Patient Details  Name: SHEMYA VELIC MRN: [id number] Date of Birth: [birth year]  [facility]) CM/SW Contact  Liliana Cline, LCSW Phone Number: 04/16/2023, 9:19 AM  Clinical Narrative:    [facility] still pending and [facility] does not take admissions on Sundays.  2:16- Auth still pending.  Expected Discharge Plan: Skilled Nursing Facility Barriers to Discharge: Continued Medical Work up  Expected Discharge Plan and Services       Living arrangements for the past 2 months: Single Family Home                                       Social Determinants of Health (SDOH) Interventions SDOH Screenings   Food Insecurity: No Food Insecurity (04/08/2023)  Housing: Low Risk  (04/15/2023)  Transportation Needs: No Transportation Needs (04/08/2023)  Utilities: Not At Risk (04/08/2023)  Alcohol Screen: Low Risk  (02/02/2023)  Depression (PHQ2-9): Low Risk  (04/06/2023)  Financial Resource Strain: Low Risk  (11/01/2022)  Physical Activity: Inactive (11/01/2022)  Social Connections: Socially Isolated (11/01/2022)  Stress: No Stress Concern Present (11/01/2022)  Tobacco Use: Medium Risk (04/06/2023)    Readmission Risk Interventions    04/09/2023    2:34 PM  Readmission Risk Prevention Plan  Transportation Screening Complete  PCP or Specialist Appt within 5-7 Days Complete  Home Care Screening Complete  Medication Review (RN CM) Complete

## 2023-04-16 NOTE — Assessment & Plan Note (Signed)
 Nephrology following, their assistance is appreciated  Patient receiving hemodialysis per her schedule of M-W-F.

## 2023-04-16 NOTE — Assessment & Plan Note (Signed)
Strep anginosus growing out on both urine and blood cultures  Previously treated with intravenous ceftriaxone however now infectious disease is recommending patient be transition to intravenous Ancef to be administered with dialysis over the course of a total of 4 weeks. Enterococcus faecalis growing out of urine on 12/6 felt to be a contaminant.   Infectious disease following, their input is appreciated  TTE performed 12/12 reveals no evidence of infective endocarditis.  Patient unlikely to tolerate TEE due to cervical disease.   Repeat surveillance blood cultures on 12/9 are without growth.

## 2023-04-16 NOTE — Progress Notes (Signed)
PROGRESS NOTE   Lori Stanley  WUJ:[id number] DOB: 12/07/1931 DOA: 04/07/2023 PCP: [name], MD   Date of Service: the patient was seen and examined on 04/16/2023  Brief Narrative:  89 yo female with the past medical history of chronic hypoxic respiratory failure  (on 2lpm via [place] with sleep/exertion), diastolic heart failure, hypertension, ESRD, and gastritis who presented with difficulty swallowing for 2 to 3 weeks prior to hospitalization with progressive weight loss.  Due to concerns over failure to thrive and volume depletion the hospitalist group was then called to assess the patient for admission to the hospital.  During a thorough evaluation patient was identified to have urinalysis suggestive of urinary tract infection.  Urine culture on 12/5 grew out streptococcus anginosus with additional blood culture drawn on 12/6 growing out same organism.  Of note, urine culture performed on 12/6 grew out Enterococcus faecalis however this was felt to be contaminant.  Patient was treated with intravenous ceftriaxone and infectious disease was consulted.  Infectious disease recommended evaluating for infective endocarditis in the setting of systolic murmur on cardiac examination although due to cervical spine disease a TEE could not be performed.  They recommended a TTE instead.  Concerning the patient's difficulty swallowing Dr. [name] with ENT was consulted and performed a transnasal flexible laryngoscopy revealing some mild edema on laryngoscopy with a short course of systemic steroids recommended.  Patient received hemodialysis per her usual Monday, Wednesday, Friday dialysis treatment schedule while hospitalized.  Throughout this complicated hospitalization physical therapy evaluated the patient and patient felt the patient would benefit from skilled services in a skilled nursing facility.  Plan is for patient to proceed with skilled nurse facility placement once bed is  obtained and insurance authorization is complete.   Assessment & Plan Streptococcal bacteremia Strep anginosus growing out on both urine and blood cultures  Previously treated with intravenous ceftriaxone however now infectious disease is recommending patient be transition to intravenous Ancef to be administered with dialysis over the course of a total of 4 weeks. Enterococcus faecalis growing out of urine on 12/6 felt to be a contaminant.   Infectious disease following, their input is appreciated  TTE performed 12/12 reveals no evidence of infective endocarditis.  Patient unlikely to tolerate TEE due to cervical disease.   Repeat surveillance blood cultures on 12/9 are without growth. UTI (urinary tract infection) See assessment and plan above ESRD (end stage renal disease) [facility]) Nephrology following, their assistance is appreciated  Patient receiving hemodialysis per her schedule of M-W-F.   Dysphagia Continue pantoprazole for antiacid therapy Per ENT recommendations with a short course of prednisone for laryngeal edema seen on laryngoscopy during this hospitalization.  Will likely discontinue at time of discharge.  Essential hypertension Relaxed blood pressure targets due to advanced age  Improved blood pressures with recently increased Coreg to 6.25 mg twice daily. As intravenous hydralazine for markedly elevated blood pressure. Type 2 diabetes mellitus without complication, without long-term current use of insulin (HCC) Longstanding uncomplicated diabetes mellitus, supportive care  Last reported hemoglobin A1c of 5.0% in 2023 Chronic diastolic CHF (congestive heart failure) (HCC) No clinical evidence of cardiogenic volume overload Strict input and output monitoring Daily weights Low-sodium diet  Anxiety Continue sertraline and trazodone.  Added as needed alprazolam for anxiety.      Subjective:  Patient complaining of occasional cough productive of clear sputum.   Patient denies any associated shortness of breath.  Physical Exam:  Vitals:   04/15/23 0750 04/15/23 1629 04/15/23 2337  04/16/23 0822  BP: (!) 152/56 (!) 147/63 (!) 152/60 (!) 151/59  Pulse: 66 80 81 76  Resp: 16 15 16 16   Temp: 97.8 F (36.6 C) 98 F (36.7 C) 98 F (36.7 C) 98.3 F (36.8 C)  TempSrc:      SpO2: 93% 100% 99% 100%  Weight:      Height:         Constitutional: Awake alert and oriented x3, no associated distress.   Skin: no rashes, no lesions, good skin turgor noted. Eyes: Pupils are equally reactive to light.  No evidence of scleral icterus or conjunctival pallor.  ENMT: Moist mucous membranes noted.  Posterior pharynx clear of any exudate or lesions.   Respiratory:  rhonchi bilaterally, no wheezing, no crackles. Normal respiratory effort. No accessory muscle use.  Cardiovascular: Notable 3 out of 6 systolic murmur.  Notably hyperdynamic PMI.  No extremity edema. 2+ pedal pulses. No carotid bruits.  Abdomen: Abdomen is soft and nontender.  No evidence of intra-abdominal masses.  Positive bowel sounds noted in all quadrants.   Musculoskeletal: No joint deformity upper and lower extremities. Good ROM, no contractures. Normal muscle tone.    Data Reviewed:  I have personally reviewed and interpreted labs, imaging.  Significant findings are   CBC: Recent Labs  Lab 04/10/23 0210 04/12/23 0803 04/13/23 0424 04/14/23 0422  WBC 9.4 7.6 6.3 5.9  NEUTROABS  --   --  5.0  --   HGB 11.3* 11.4* 11.9* 10.6*  HCT 36.0 37.0 37.7 33.9*  MCV 88.2 88.9 87.7 89.0  PLT 213 252 230 225   Basic Metabolic Panel: Recent Labs  Lab 04/10/23 0210 04/11/23 0503 04/12/23 0803 04/13/23 0424 04/14/23 0422 04/16/23 0446  NA 136 137 140 136 135 138  K 3.8 3.4* 4.2 4.0 3.9 3.9  CL 99 99 103 98 98 100  CO2 27 29 29 30 31 28   GLUCOSE 186* 77 83 70 77 88  BUN 22 21 28* 13 26* 43*  CREATININE 2.30* 2.09* 2.55* 1.88* 2.70* 3.06*  CALCIUM 9.1 9.1 9.3 9.3 8.9 8.9  MG  --   --   1.9 2.2 1.9  --   PHOS 4.2  --  3.6  --  3.9  --    GFR: Estimated Creatinine Clearance: 7.8 mL/min (A) (by C-G formula based on SCr of 3.06 mg/dL (H)). Liver Function Tests: Recent Labs  Lab 04/10/23 0210 04/13/23 0424 04/14/23 0422  AST  --  16 13*  ALT  --  10 6  ALKPHOS  --  161* 138*  BILITOT  --  0.8 0.6  PROT  --  5.8* 5.4*  ALBUMIN 2.6* 2.7* 2.6*    Code Status:  Full code.  Code status decision has been confirmed with: patient    Severity of Illness:  The appropriate patient status for this patient is INPATIENT. Inpatient status is judged to be reasonable and necessary in order to provide the required intensity of service to ensure the patient's safety. The patient's presenting symptoms, physical exam findings, and initial radiographic and laboratory data in the context of their chronic comorbidities is felt to place them at high risk for further clinical deterioration. Furthermore, it is not anticipated that the patient will be medically stable for discharge from the hospital within 2 midnights of admission.   * I certify that at the point of admission it is my clinical judgment that the patient will require inpatient hospital care spanning beyond 2 midnights from the  point of admission due to high intensity of service, high risk for further deterioration and high frequency of surveillance required.*  Time spent:  40 minutes  Author:  Marinda Elk MD  04/16/2023 9:04 AM

## 2023-04-16 NOTE — Assessment & Plan Note (Signed)
 Continue pantoprazole for antiacid therapy Per ENT recommendations with a short course of prednisone for laryngeal edema seen on laryngoscopy during this hospitalization.  Will likely discontinue at time of discharge.

## 2023-04-16 NOTE — Assessment & Plan Note (Signed)
No clinical evidence of cardiogenic volume overload Strict input and output monitoring Daily weights Low-sodium diet  

## 2023-04-16 NOTE — Assessment & Plan Note (Signed)
See assessment and plan above

## 2023-04-16 NOTE — Consult Note (Signed)
PHARMACY CONSULT NOTE - ELECTROLYTES  Pharmacy Consult for Electrolyte Monitoring and Replacement   Recent Labs: Height: 5\' 2"  (157.5 cm) Weight: 39.7 kg (87 lb 8.4 oz) IBW/kg (Calculated) : 50.1 Estimated Creatinine Clearance: 7.8 mL/min (A) (by C-G formula based on SCr of 3.06 mg/dL (H)).  Potassium (mmol/L)  Date Value  04/16/2023 3.9  05/15/2013 4.0   Magnesium (mg/dL)  Date Value  16/01/9603 1.9   Calcium (mg/dL)  Date Value  54/12/8117 8.9   Calcium, Total (mg/dL)  Date Value  14/78/2956 8.0 (L)   Albumin (g/dL)  Date Value  21/30/8657 2.6 (L)  04/06/2023 3.8  05/11/2013 3.6   Phosphorus (mg/dL)  Date Value  84/69/6295 3.9   Sodium (mmol/L)  Date Value  04/16/2023 138  04/06/2023 141  05/15/2013 132 (L)   Assessment  Lori Stanley is a 89 y.o. female presenting with failure to thrive. Pharmacy has been consulted to monitor and replace electrolytes. ESRD on hemodialysis    Pertinent medications: torsemide 100 mg po daily  Goal of Therapy: Electrolytes WNL  Plan:  No replacement needed F/u with AM labs.   Thank you for allowing pharmacy to be a part of this patient's care.  [name], PharmD 04/16/2023 8:20 AM

## 2023-04-17 DIAGNOSIS — B955 Unspecified streptococcus as the cause of diseases classified elsewhere: Secondary | ICD-10-CM | POA: Diagnosis not present

## 2023-04-17 DIAGNOSIS — R7881 Bacteremia: Secondary | ICD-10-CM | POA: Diagnosis not present

## 2023-04-17 LAB — MAGNESIUM: Magnesium: 1.9 mg/dL (ref 1.7–2.4)

## 2023-04-17 LAB — RENAL FUNCTION PANEL
Albumin: 2.5 g/dL — ABNORMAL LOW (ref 3.5–5.0)
Anion gap: 9 (ref 5–15)
BUN: 53 mg/dL — ABNORMAL HIGH (ref 8–23)
CO2: 26 mmol/L (ref 22–32)
Calcium: 9.1 mg/dL (ref 8.9–10.3)
Chloride: 101 mmol/L (ref 98–111)
Creatinine, Ser: 3.55 mg/dL — ABNORMAL HIGH (ref 0.44–1.00)
GFR, Estimated: 12 mL/min — ABNORMAL LOW (ref >60)
Glucose, Bld: 95 mg/dL (ref 70–99)
Phosphorus: 4.6 mg/dL (ref 2.5–4.6)
Potassium: 3.8 mmol/L (ref 3.5–5.1)
Sodium: 136 mmol/L (ref 135–145)

## 2023-04-17 LAB — CBC
HCT: 33.6 % — ABNORMAL LOW (ref 36.0–46.0)
Hemoglobin: 10.5 g/dL — ABNORMAL LOW (ref 12.0–15.0)
MCH: 27.3 pg (ref 26.0–34.0)
MCHC: 31.3 g/dL (ref 30.0–36.0)
MCV: 87.3 fL (ref 80.0–100.0)
Platelets: 235 10*3/uL (ref 150–400)
RBC: 3.85 MIL/uL — ABNORMAL LOW (ref 3.87–5.11)
RDW: 14.9 % (ref 11.5–15.5)
WBC: 6.7 10*3/uL (ref 4.0–10.5)
nRBC: 0 % (ref 0.0–0.2)

## 2023-04-17 NOTE — Plan of Care (Signed)

## 2023-04-17 NOTE — Progress Notes (Signed)
Dialysis called for report. RN provided report and answered questions. Dialysis said that they will provide patient with dialysis on second round.

## 2023-04-17 NOTE — Progress Notes (Signed)
Nutrition Follow-up  DOCUMENTATION CODES:   Underweight, Severe malnutrition in context of chronic illness  INTERVENTION:   -Continue renal MVI daily -Continue Boost Breeze po TID, each supplement provides 250 kcal and 9 grams of protein  D/c Magic cup TID with meals, each supplement provides 290 kcal and 9 grams of protein  -Continue 30 ml Prosource Plus BID, each supplement provides 100 kcals and 15 grams protein -Continue dysphagia 1 diet for ease of intake  NUTRITION DIAGNOSIS:   Severe Malnutrition related to chronic illness (ESRD on HD) as evidenced by moderate fat depletion, severe fat depletion, moderate muscle depletion, severe muscle depletion.  Ongoing  GOAL:   Patient will meet greater than or equal to 90% of their needs  Progressing   MONITOR:   PO intake, Supplement acceptance, Diet advancement  REASON FOR ASSESSMENT:   Consult Assessment of nutrition requirement/status  ASSESSMENT:   Pt with medical history significant of multiple medical issues including HFpEF, ESRD on hemodialysis Monday Wednesday Friday, GERD, hypertension presenting with failure to thrive, pneumonia, gastritis.  Patient reports difficulty swallowing over the past 2 to 3 weeks PTA.  12/12- diet downgraded diet to dysphagia 1 diet for ease of intake per pt/ family request  Reviewed I/O's: +920 ml x 24 hours and +1.4 L since admission  UOP: 0 ml x 24 hours  Spoke with pt at bedside, who was pleasant and in good spirits today. Pt is feeling much better today. Pt reports intake has improved; noted meal completions 50-100%.   Pt reports improvement in intake and tolerating diet downgrade. Pt reports she is drinking supplements, but cannot drink Ensure or take Magic Cups as she does not tolerate milk products well ("it gives me diarrhea"). Pt is amenable to [employer].   Discussed importance of good meal and supplement intake to promote healing.   Noted mild wt gain since admission,  which is favorable given malnutrition.   Per TOC notes, plan SNF placement once medically stable and insurance authorization has been obtained.   Medications reviewed and include renal MVI, protonix, and demadex.   Labs reviewed: CBGS: 120 (inpatient orders for glycemic control are none).    Diet Order:   Diet Order             DIET - DYS 1 Room service appropriate? Yes; Fluid consistency: Thin  Diet effective now                   EDUCATION NEEDS:   Education needs have been addressed  Skin:  Skin Assessment: Reviewed RN Assessment  Last BM:  04/17/23  Height:   Ht Readings from Last 1 Encounters:  04/08/23 5\' 2"  (1.575 m)    Weight:   Wt Readings from Last 1 Encounters:  04/14/23 39.7 kg    Ideal Body Weight:  50 kg  BMI:  Body mass index is 16.01 kg/m.  Estimated Nutritional Needs:   Kcal:  1500-1700  Protein:  70-85 grams  Fluid:  1000 ml + UOP    Levada Schilling, RD, LDN, CDCES Registered Dietitian III Certified Diabetes Care and Education Specialist If unable to reach this RD, please use "RD Inpatient" group chat on secure chat between hours of 8am-4 pm daily

## 2023-04-17 NOTE — Consult Note (Signed)
PHARMACY CONSULT NOTE - ELECTROLYTES  Pharmacy Consult for Electrolyte Monitoring and Replacement   Recent Labs: Height: 5\' 2"  (157.5 cm) Weight: 39.7 kg (87 lb 8.4 oz) IBW/kg (Calculated) : 50.1 Estimated Creatinine Clearance: 6.7 mL/min (A) (by C-G formula based on SCr of 3.55 mg/dL (H)).  Potassium (mmol/L)  Date Value  04/17/2023 3.8  05/15/2013 4.0   Magnesium (mg/dL)  Date Value  36/64/4034 1.9   Calcium (mg/dL)  Date Value  74/25/9563 9.1   Calcium, Total (mg/dL)  Date Value  87/56/4332 8.0 (L)   Albumin (g/dL)  Date Value  95/18/8416 2.5 (L)  04/06/2023 3.8  05/11/2013 3.6   Phosphorus (mg/dL)  Date Value  60/63/0160 4.6   Sodium (mmol/L)  Date Value  04/17/2023 136  04/06/2023 141  05/15/2013 132 (L)   Assessment  Lori Stanley is a 89 y.o. female presenting with failure to thrive. Pharmacy has been consulted to monitor and replace electrolytes. ESRD on hemodialysis    Pertinent medications: torsemide 100 mg po daily  Goal of Therapy: Electrolytes WNL  Plan:  No replacement needed F/u with AM labs.   Thank you for allowing pharmacy to be a part of this patient's care.  Alexsys Eskin Rodriguez-Guzman PharmD, BCPS 04/17/2023 8:25 AM

## 2023-04-17 NOTE — Plan of Care (Signed)

## 2023-04-17 NOTE — Progress Notes (Addendum)
PROGRESS NOTE    LELIANA SEHNERT  ZOX:[id number] DOB: 04/16/1932 DOA: 04/07/2023 PCP: [name], MD    Brief Narrative:   Lori Stanley is a 89 y.o. female with past medical history significant for chronic hypoxic respiratory failure on 2 L nasal cannula at baseline, chronic diastolic congestive heart failure, HTN, ESRD on HD MWF, gastritis who presented to [facility] ED on 12/6 with difficulty swallowing, progressive weight loss, adult failure to thrive.  Workup in the ED notable for urinalysis suggestive of urinary tract infection.  Patient was admitted for further evaluation and management.    Urine culture 12/5 with Streptococcus anginosus and blood culture also became positive on 12/6 with same organism.  Patient was treated with IV ceftriaxone.    Infectious disease was consulted and followed during hospital course.  TTE was performed with no concerns for infective endocarditis.  TEE was considered but given cervical spine disease and difficulty swallowing this was determined to be high risk procedure.    Patient was seen by ENT, Dr. [name]; transnasal flexible laryngoscopy was performed revealing some mild edema with recommendation of a short course of systemic steroids.  Patient also was seen by physical therapy with recommendation of SNF on discharge.  Plan discharge to rehab tomorrow.  Assessment & Plan:   Streptococcus anginosus septicemia/UTI Patient presenting to the ED with progressive weight loss, weakness/fatigue, adult failure to thrive.  Urinalysis consistent with urinary tract infection patient was started on empiric antibiotics.  Urine culture positive for Streptococcus anginosus and blood cultures also positive for same organism.  Was seen by infectious disease; TTE with LVEF 60 to 65%, severe asymmetric LVH, grade 1 diastolic dysfunction, no vegetations reported.  Consider TEE but given her cervical spine disease high risk for complications and deferred.   Repeat blood cultures 12/9 with no growth. -- Cefazolin 2 g IV with HD MWF (projected end date 05/05/2023) -- Outpatient follow-up with ID 12/26 at 11:45 AM  Dysphagia 2/2 laryngeal edema Patient was seen by ENT, transnasal flexible laryngoscopy was performed revealing some mild edema with recommendation of a short course of systemic steroids in which she completed inpatient.  ESRD on HD MWF Nephrology was consulted and followed during hospital course.  Continue HD on a Monday/Wednesday/Friday schedule.  Essential hypertension Chronic diastolic congestive heart failure TTE with LVEF 60 to 65%, severe asymmetric LVH, grade 1 diastolic dysfunction -- Carvedilol 6.25 mg p.o. twice daily -- Torsemide 100 mg p.o. daily  Chronic hypoxic respiratory failure Continue home supplemental oxygen, 2 L per nasal cannula.  Type 2 diabetes mellitus Hemoglobin A1c 5.0% 2023.  Diet controlled at baseline.  Chronic diastolic congestive heart failure, compensated Low-sodium diet.  Continue volume management with HD.  Recommend continue to monitor weights intermittently.  Anxiety -- Sertraline 25 mg p.o. daily -- Trazodone 100 mg p.o. at bedtime PRN insomnia -- Alprazolam 0.5 mg TID PRN anxiety  GERD/gastritis: -- Protonix 40 mg p.o. daily  Severe protein calorie malnutrition, POA Body mass index is 15.73 kg/m.  Nutrition Status: Nutrition Problem: Severe Malnutrition Etiology: chronic illness (ESRD on HD) Signs/Symptoms: moderate fat depletion, severe fat depletion, moderate muscle depletion, severe muscle depletion Interventions: MVI, Ensure Enlive (each supplement provides 350kcal and 20 grams of protein), Magic cup, Prostat -- Evaluated by dietitian, continue to encourage increased oral intake, supplementation    DVT prophylaxis: heparin injection 5,000 Units Start: 04/07/23 1000    Code Status: Full Code Family Communication: No family present at bedside this morning  Disposition  Plan:  Level of care: Med-Surg Status is: Inpatient Remains inpatient appropriate because: Pending discharge to SNF tomorrow    Consultants:  Nephrology  Procedures:  TTE  Antimicrobials:  Ceftriaxone 12/5 - 12/9 Azithromycin 04/07/11/7 Cefazolin 12/11>>   Subjective: Patient examined bedside, resting calmly.  Lying in bed.  No complaints this morning other than "wanting to go home".  Discussed with patient, anticipate discharge to rehab tomorrow hopefully for short time then can return home.  Patient appreciative all the care she is received.  No other questions or concerns at this time.  Denies headache, no dizziness, no chest pain, no palpitations, no shortness of breath, no abdominal pain, no fever/chills/night sweats, no nausea/vomiting/diarrhea, no focal weakness, no fatigue, no paresthesia.  No acute events overnight per nursing staff.  Objective: Vitals:   04/16/23 2103 04/17/23 0754 04/17/23 1344 04/17/23 1353  BP: (!) 131/55 (!) 141/85  (!) 136/57  Pulse: 81 69  72  Resp: 20 16    Temp: 99 F (37.2 C) 97.8 F (36.6 C)  98.2 F (36.8 C)  TempSrc: Oral   Oral  SpO2: 99% 99%  99%  Weight:   39 kg   Height:        Intake/Output Summary (Last 24 hours) at 04/17/2023 1358 Last data filed at 04/17/2023 1000 Gross per 24 hour  Intake 600 ml  Output 0 ml  Net 600 ml   Filed Weights   04/14/23 0905 04/14/23 1332 04/17/23 1344  Weight: 39.7 kg 39.7 kg 39 kg    Examination:  Physical Exam: GEN: NAD, alert and oriented x 3, elderly/chronically ill appearance HEENT: NCAT, PERRL, EOMI, sclera clear, MMM PULM: Breath sounds slight diminished bilateral bases, no wheezes/crackles, normal respiratory effort without accessory muscle use, on 2 L nasal cannula with SpO2 99% at rest CV: RRR w/o M/G/R GI: abd soft, NTND, NABS, no R/G/M MSK: no peripheral edema, moves all extremities independently NEURO: No focal neurological deficits PSYCH: normal  mood/affect Integumentary: No concerning rashes/lesions/wounds noted on exposed skin surfaces    Data Reviewed: I have personally reviewed following labs and imaging studies  CBC: Recent Labs  Lab 04/12/23 0803 04/13/23 0424 04/14/23 0422 04/16/23 1022  WBC 7.6 6.3 5.9 6.4  NEUTROABS  --  5.0  --   --   HGB 11.4* 11.9* 10.6* 12.6  HCT 37.0 37.7 33.9* 40.3  MCV 88.9 87.7 89.0 88.0  PLT 252 230 225 222   Basic Metabolic Panel: Recent Labs  Lab 04/12/23 0803 04/13/23 0424 04/14/23 0422 04/16/23 0446 04/16/23 1022 04/17/23 0413  NA 140 136 135 138  --  136  K 4.2 4.0 3.9 3.9  --  3.8  CL 103 98 98 100  --  101  CO2 29 30 31 28   --  26  GLUCOSE 83 70 77 88  --  95  BUN 28* 13 26* 43*  --  53*  CREATININE 2.55* 1.88* 2.70* 3.06*  --  3.55*  CALCIUM 9.3 9.3 8.9 8.9  --  9.1  MG 1.9 2.2 1.9  --   --  1.9  PHOS 3.6  --  3.9  --  4.2 4.6   GFR: Estimated Creatinine Clearance: 6.6 mL/min (A) (by C-G formula based on SCr of 3.55 mg/dL (H)). Liver Function Tests: Recent Labs  Lab 04/13/23 0424 04/14/23 0422 04/17/23 0413  AST 16 13*  --   ALT 10 6  --   ALKPHOS 161* 138*  --   BILITOT 0.8 0.6  --  PROT 5.8* 5.4*  --   ALBUMIN 2.7* 2.6* 2.5*   No results for input(s): "LIPASE", "AMYLASE" in the last 168 hours. No results for input(s): "AMMONIA" in the last 168 hours. Coagulation Profile: No results for input(s): "INR", "PROTIME" in the last 168 hours. Cardiac Enzymes: No results for input(s): "CKTOTAL", "CKMB", "CKMBINDEX", "TROPONINI" in the last 168 hours. BNP (last 3 results) No results for input(s): "PROBNP" in the last 8760 hours. HbA1C: No results for input(s): "HGBA1C" in the last 72 hours. CBG: No results for input(s): "GLUCAP" in the last 168 hours. Lipid Profile: No results for input(s): "CHOL", "HDL", "LDLCALC", "TRIG", "CHOLHDL", "LDLDIRECT" in the last 72 hours. Thyroid Function Tests: No results for input(s): "TSH", "T4TOTAL", "FREET4",  "T3FREE", "THYROIDAB" in the last 72 hours. Anemia Panel: No results for input(s): "VITAMINB12", "FOLATE", "FERRITIN", "TIBC", "IRON", "RETICCTPCT" in the last 72 hours. Sepsis Labs: No results for input(s): "PROCALCITON", "LATICACIDVEN" in the last 168 hours.  Recent Results (from the past 240 hours)  Expectorated Sputum Assessment w Gram Stain, Rflx to Resp Cult     Status: None   Collection Time: 04/09/23 12:24 PM   Specimen: Sputum  Result Value Ref Range Status   Specimen Description SPUTUM  Final   Special Requests NONE  Final   Sputum evaluation   Final    Sputum specimen not acceptable for testing.  Please recollect.   C/JOAN WILLIS AT 1329 04/09/23.PMF Performed at [facility], [address] [place], [place] [address]    Report Status 04/09/2023 FINAL  Final  Culture, blood (Routine X 2) w Reflex to ID Panel     Status: None   Collection Time: 04/10/23  6:10 PM   Specimen: BLOOD  Result Value Ref Range Status   Specimen Description BLOOD RIGHT ANTECUBITAL  Final   Special Requests   Final    BOTTLES DRAWN AEROBIC AND ANAEROBIC Blood Culture adequate volume   Culture   Final    NO GROWTH 5 DAYS Performed at [facility], [address], [place], [place] [address]    Report Status 04/15/2023 FINAL  Final  Culture, blood (Routine X 2) w Reflex to ID Panel     Status: None   Collection Time: 04/10/23  6:10 PM   Specimen: BLOOD RIGHT HAND  Result Value Ref Range Status   Specimen Description BLOOD RIGHT HAND  Final   Special Requests   Final    BOTTLES DRAWN AEROBIC AND ANAEROBIC Blood Culture adequate volume   Culture   Final    NO GROWTH 5 DAYS Performed at [facility], [address], [place], [place] [address]    Report Status 04/15/2023 FINAL  Final         Radiology Studies: DG Chest 1 View Result Date: 04/16/2023 CLINICAL DATA:  [id number] Pneumonia [id number] EXAM: CHEST  1 VIEW COMPARISON:  04/06/2023 chest radiograph.  FINDINGS: Stable cardiomediastinal silhouette with normal heart size. No pneumothorax. No pleural effusion. Low lung volumes. No overt pulmonary edema. Mild streaky bibasilar lung opacities, similar. Advanced bilateral glenohumeral osteoarthritis. Vascular stent overlies the medial proximal left upper extremity soft tissues. IMPRESSION: Low lung volumes with mild streaky bibasilar lung opacities, similar, favor atelectasis. Electronically Signed   By: Delbert Phenix M.D.   On: 04/16/2023 16:54        Scheduled Meds:  (feeding supplement) PROSource Plus  30 mL Oral BID BM   brimonidine  1 drop Both Eyes BID   carvedilol  6.25 mg Oral  BID   Chlorhexidine Gluconate Cloth  6 each Topical Daily   feeding supplement  1 Container Oral TID BM   heparin  5,000 Units Subcutaneous Q12H   multivitamin  1 tablet Oral Daily   nystatin  5 mL Oral QID   mouth rinse  15 mL Mouth Rinse 4 times per day   pantoprazole  40 mg Oral Daily   sertraline  25 mg Oral Daily   torsemide  100 mg Oral Daily   Continuous Infusions:  anticoagulant sodium citrate      ceFAZolin (ANCEF) IV Stopped (04/14/23 1727)     LOS: 10 days    Time spent: 52 minutes spent on chart review, discussion with nursing staff, consultants, updating family and interview/physical exam; more than 50% of that time was spent in counseling and/or coordination of care.    Alvira Philips [place], DO [facility] Available via Epic secure chat 7am-7pm After these hours, please refer to coverage provider listed on [email or web address] 04/17/2023, 1:58 PM

## 2023-04-17 NOTE — Progress Notes (Signed)
PT Cancellation Note  Patient Details Name: Lori Stanley MRN: [id number] DOB: 17-Sep-1931   Cancelled Treatment:    Reason Eval/Treat Not Completed: Patient at procedure or test/unavailable.  Attempted to see pt earlier today but pt was busy with nursing care.  Pt now off floor for dialysis (not available for PT session).  Will re-attempt to see pt at a later date/time.  Hendricks Limes, PT 04/17/23, 1:27 PM

## 2023-04-17 NOTE — Progress Notes (Signed)
Occupational Therapy Treatment Patient Details Name: Lori Stanley MRN: [id number] DOB: Dec 26, 1931 Today's Date: 04/17/2023   History of present illness 89 y.o. female with PMHx of HFpEF, ESRD on hemodialysis Monday Wednesday Friday, GERD, hypertension presenting with failure to thrive, pneumonia, gastritis, Cholelithiasis and Colonic diverticulosis and UTI.   OT comments  Pt is supine in bed on arrival. NT finished up pt care for bathing in bed. Pt agreeable to OT session. She denies pain. Pt performed bed mobility with SUP with HOB elevated and able to scoot to EOB with SUP. Performed posterior hygiene d/t reports of her bottom itching as pt has continued to have mult Bms-able to perform seated EOB via lateral leans with SUP. Pt stood at sink x2 during session to perform hand hygiene with Min A for turning on faucets and to provide support for balance d/t posterior bias/leaning intermittently. She ambulated within the room with constant CGA and use of RW to prevent loss of balance from posterior lean. Stood at bedside to perform peri-care again and put cream on bottom in hopes of decreasing itch with Min/CGA provided for balance. No SOB noted throughout. Pt returned to bed with all needs in place and will cont to require skilled acute OT services to maximize her safety and IND to return to PLOF d/t remaining weakness and balance deficits.       If plan is discharge home, recommend the following:  A little help with walking and/or transfers;A little help with bathing/dressing/bathroom;Help with stairs or ramp for entrance;Assist for transportation;Assistance with cooking/housework   Equipment Recommendations  BSC/3in1    Recommendations for Other Services      Precautions / Restrictions Restrictions Weight Bearing Restrictions Per Provider Order: No       Mobility Bed Mobility Overal bed mobility: Needs Assistance Bed Mobility: Supine to Sit, Sit to Supine     Supine to sit:  Supervision, Used rails, HOB elevated Sit to supine: Supervision        Transfers Overall transfer level: Needs assistance Equipment used: Rolling walker (2 wheels) Transfers: Sit to/from Stand Sit to Stand: Min assist           General transfer comment: Min A for STS from EOB and in room mobility d/t posterior lean at times     Balance Overall balance assessment: Needs assistance Sitting-balance support: No upper extremity supported, Feet supported Sitting balance-Leahy Scale: Good   Postural control: Posterior lean Standing balance support: Bilateral upper extremity supported Standing balance-Leahy Scale: Poor Standing balance comment: intermittent assist to steady with ambulation d//t posterior lean                           ADL either performed or assessed with clinical judgement   ADL Overall ADL's : Needs assistance/impaired     Grooming: Wash/dry hands;Standing;Minimal assistance Grooming Details (indicate cue type and reason): Min A for balance d/t posterior lean; needed assist to reach faucet handles to turn water on/off     Lower Body Bathing: Contact guard assist Lower Body Bathing Details (indicate cue type and reason): CGA in standing for pt to perform LB hygiene/apply cream to bottom                            Extremity/Trunk Assessment              Vision       Perception  Praxis      Cognition Arousal: Alert Behavior During Therapy: WFL for tasks assessed/performed Overall Cognitive Status: History of cognitive impairments - at baseline                                 General Comments: Oriented to self, location, and general situation        Exercises      Shoulder Instructions       General Comments remained on 2L throughout with no SOB noted    Pertinent Vitals/ Pain       Pain Assessment Pain Assessment: No/denies pain Pain Intervention(s): Monitored during session, Limited  activity within patient's tolerance  Home Living                                          Prior Functioning/Environment              Frequency  Min 1X/week        Progress Toward Goals  OT Goals(current goals can now be found in the care plan section)  Progress towards OT goals: Progressing toward goals  Acute Rehab OT Goals Patient Stated Goal: improve strength OT Goal Formulation: With patient Time For Goal Achievement: 04/21/23 Potential to Achieve Goals: Good  Plan      Co-evaluation                 AM-PAC OT "6 Clicks" Daily Activity     Outcome Measure   Help from another person eating meals?: None Help from another person taking care of personal grooming?: A Little Help from another person toileting, which includes using toliet, bedpan, or urinal?: A Little Help from another person bathing (including washing, rinsing, drying)?: A Lot Help from another person to put on and taking off regular upper body clothing?: A Little Help from another person to put on and taking off regular lower body clothing?: A Lot 6 Click Score: 17    End of Session Equipment Utilized During Treatment: Rolling walker (2 wheels)  OT Visit Diagnosis: Other abnormalities of gait and mobility (R26.89)   Activity Tolerance Patient tolerated treatment well   Patient Left in bed;with bed alarm set;with nursing/sitter in room;with call bell/phone within reach   Nurse Communication Mobility status        Time: 1610-9604 OT Time Calculation (min): 21 min  Charges: OT General Charges $OT Visit: 1 Visit OT Treatments $Therapeutic Activity: 8-22 mins  Raina Sole, OTR/L  04/17/23, 12:53 PM   Velvia Mehrer E Adama Ivins 04/17/2023, 12:49 PM

## 2023-04-17 NOTE — Progress Notes (Signed)
RN delegated CHG bath to the nurse aid as pt is due to go to hemodialysis, today.

## 2023-04-17 NOTE — Progress Notes (Signed)
Hemodialysis note  Received patient in bed to unit. Alert and oriented.  Informed consent signed and in chart.  Treatment initiated: 1433 Treatment completed: 1739  HD Was terminated early due to brief episode of blank stare and bradycardia during HD. Per patient, she was feeling bad prior to that. After returning patient's blood, she was back to her baseline. Patient Dr. Thedore Mins was notified.  Transported back to room, alert without acute distress.  Report given to patient's RN.   Access used: LUA AVG Access issues: high arterial pressure  Total UF removed: 200 ml (due to hypotension) Medication(s) given:  Ancef 2 G IV BP: 142/51, HR: 70, RR: 22, O2sat: 100% Post HD weight: 39 kg   [facility]

## 2023-04-17 NOTE — Progress Notes (Signed)
Pt signed consent form for hemodialysis.  RN witnessed it and placed the consent form in pt chart.

## 2023-04-17 NOTE — Progress Notes (Signed)
1442: Patient had an episode of hypotension during HD. UF Was paused and total of 300 ml NS bolus was given. HD NP was notified. Patient was back to baseline after fluid administration. Will monitor patient closely.   Per Patient, she normally does HD for 4 hours. HD NP made aware and agreed to decrease tx time to 3 hours.

## 2023-04-17 NOTE — TOC Progression Note (Signed)
Transition of Care [facility]) - Progression Note    Patient Details  Name: Lori Stanley MRN: [id number] Date of Birth: [birth year]  Transition of Care [facility]) CM/SW Contact  Garret Reddish, RN Phone Number: 04/17/2023, 12:47 PM  Clinical Narrative:    Chart reviewed.  I have received a call from [facility], team member Amy.  She informs me that [facility] is not in network with [employer].  I have informed Lori Stanley and her niece of the above information.  I have reviewed other bed offers and Lori Stanley has selected [facility].  I have made Amy aware that patient has selected [facility] and she will change SNF authorization to [facility].    I have spoken with Tammy from [facility].  I have reviewed outpatient dialysis schedule with Tammy. She will be able to accept patient on tomorrow.    TOC will continue to follow for discharge planning.     Expected Discharge Plan: Skilled Nursing Facility Barriers to Discharge: Continued Medical Work up  Expected Discharge Plan and Services       Living arrangements for the past 2 months: Single Family Home                                       Social Determinants of Health (SDOH) Interventions SDOH Screenings   Food Insecurity: No Food Insecurity (04/08/2023)  Housing: Low Risk  (04/15/2023)  Transportation Needs: No Transportation Needs (04/08/2023)  Utilities: Not At Risk (04/08/2023)  Alcohol Screen: Low Risk  (02/02/2023)  Depression (PHQ2-9): Low Risk  (04/06/2023)  Financial Resource Strain: Low Risk  (11/01/2022)  Physical Activity: Inactive (11/01/2022)  Social Connections: Socially Isolated (11/01/2022)  Stress: No Stress Concern Present (11/01/2022)  Tobacco Use: Medium Risk (04/06/2023)    Readmission Risk Interventions    04/09/2023    2:34 PM  Readmission Risk Prevention Plan  Transportation Screening Complete  PCP or Specialist Appt within 5-7 Days Complete  Home Care Screening  Complete  Medication Review (RN CM) Complete

## 2023-04-17 NOTE — Progress Notes (Signed)
Delegated taking VS before 2200 to nurse aide due to pt blood pressure medication.

## 2023-04-17 NOTE — Progress Notes (Signed)
[facility]  ROUNDING NOTE   Subjective:   [name] was admitted to [facility] on 04/07/2023 for Hypokalemia [E87.6] Pericardial effusion [I31.39] Pneumonia [J18.9] Failure to thrive in adult [R62.7] Urinary tract infection without hematuria, site unspecified [N39.0] Community acquired pneumonia, unspecified laterality [J18.9]   Update:  Patient seen resting in bed Alert and oriented Denies pain or discomfort Difficulty swallowing, pain, hindering oral intake  Objective:  Vital signs in last 24 hours:  Temp:  [97.8 F (36.6 C)-99 F (37.2 C)] 98.2 F (36.8 C) (12/16 1353) Pulse Rate:  [46-81] 79 (12/16 1500) Resp:  [16-30] 29 (12/16 1500) BP: (71-141)/(34-85) 135/70 (12/16 1500) SpO2:  [84 %-100 %] 100 % (12/16 1500) Weight:  [39 kg] 39 kg (12/16 1344)  Weight change:  Filed Weights   04/14/23 0905 04/14/23 1332 04/17/23 1344  Weight: 39.7 kg 39.7 kg 39 kg    Intake/Output: I/O last 3 completed shifts: In: 1040 [P.O.:1040] Out: 0    Intake/Output this shift:  Total I/O In: 240 [P.O.:240] Out: -   Physical Exam: General: NAD  Head: +dysphonic voice   Eyes: Anicteric  Lungs:  Clear to auscultation, normal effort  Heart: Regular rate and rhythm  Abdomen:  Soft, nontender  Extremities:  no peripheral edema.  Neurologic: Alert and oriented, moving all four extremities  Skin: No lesions  Access: Left AVF    Basic Metabolic Panel: Recent Labs  Lab 04/12/23 0803 04/13/23 0424 04/14/23 0422 04/16/23 0446 04/16/23 1022 04/17/23 0413  NA 140 136 135 138  --  136  K 4.2 4.0 3.9 3.9  --  3.8  CL 103 98 98 100  --  101  CO2 29 30 31 28   --  26  GLUCOSE 83 70 77 88  --  95  BUN 28* 13 26* 43*  --  53*  CREATININE 2.55* 1.88* 2.70* 3.06*  --  3.55*  CALCIUM 9.3 9.3 8.9 8.9  --  9.1  MG 1.9 2.2 1.9  --   --  1.9  PHOS 3.6  --  3.9  --  4.2 4.6    Liver Function Tests: Recent Labs  Lab 04/13/23 0424 04/14/23 0422 04/17/23 0413   AST 16 13*  --   ALT 10 6  --   ALKPHOS 161* 138*  --   BILITOT 0.8 0.6  --   PROT 5.8* 5.4*  --   ALBUMIN 2.7* 2.6* 2.5*   No results for input(s): "LIPASE", "AMYLASE" in the last 168 hours.  No results for input(s): "AMMONIA" in the last 168 hours.  CBC: Recent Labs  Lab 04/12/23 0803 04/13/23 0424 04/14/23 0422 04/16/23 1022 04/17/23 1409  WBC 7.6 6.3 5.9 6.4 6.7  NEUTROABS  --  5.0  --   --   --   HGB 11.4* 11.9* 10.6* 12.6 10.5*  HCT 37.0 37.7 33.9* 40.3 33.6*  MCV 88.9 87.7 89.0 88.0 87.3  PLT 252 230 225 222 235    Cardiac Enzymes: No results for input(s): "CKTOTAL", "CKMB", "CKMBINDEX", "TROPONINI" in the last 168 hours.  BNP: Invalid input(s): "POCBNP"  CBG: No results for input(s): "GLUCAP" in the last 168 hours.  Microbiology: Results for orders placed or performed during the hospital encounter of 04/07/23  Culture, blood (routine x 2)     Status: None   Collection Time: 04/07/23  3:04 AM   Specimen: BLOOD  Result Value Ref Range Status   Specimen Description BLOOD BLOOD RIGHT ARM  Final  Special Requests   Final    BOTTLES DRAWN AEROBIC AND ANAEROBIC Blood Culture adequate volume   Culture   Final    NO GROWTH 5 DAYS Performed at [facility], [address] [place], [place] [address]    Report Status 04/12/2023 FINAL  Final  Culture, blood (routine x 2)     Status: Abnormal (Preliminary result)   Collection Time: 04/07/23  3:40 AM   Specimen: BLOOD  Result Value Ref Range Status   Specimen Description   Final    BLOOD BLOOD RIGHT FOREARM Performed at [facility], [address] [place], [place] [address]    Special Requests   Final    BOTTLES DRAWN AEROBIC AND ANAEROBIC Blood Culture adequate volume Performed at [facility], [address] [place], [place] [address]    Culture  Setup Time   Final    GRAM POSITIVE COCCI ANAEROBIC BOTTLE ONLY CRITICAL RESULT CALLED TO, READ BACK BY AND VERIFIED  WITH: EMILY STEINBOCK @ 2104 04/07/23 LFD     Culture (A)  Final    STREPTOCOCCUS ANGINOSIS Sent to [facility] for further susceptibility testing. Performed at [facility], [address] [place], [place] [address]    Report Status PENDING  Incomplete   Organism ID, Bacteria STREPTOCOCCUS ANGINOSIS  Final      Susceptibility   Streptococcus anginosis - MIC*    PENICILLIN 0.5 INTERMEDIATE Intermediate     CEFTRIAXONE <=0.12 SENSITIVE Sensitive     ERYTHROMYCIN <=0.12 SENSITIVE Sensitive     VANCOMYCIN <=0.12 SENSITIVE Sensitive     * STREPTOCOCCUS ANGINOSIS  Blood Culture ID Panel (Reflexed)     Status: Abnormal   Collection Time: 04/07/23  3:40 AM  Result Value Ref Range Status   Enterococcus faecalis NOT DETECTED NOT DETECTED Final   Enterococcus Faecium NOT DETECTED NOT DETECTED Final   Listeria monocytogenes NOT DETECTED NOT DETECTED Final   Staphylococcus species NOT DETECTED NOT DETECTED Final   Staphylococcus aureus (BCID) NOT DETECTED NOT DETECTED Final   Staphylococcus epidermidis NOT DETECTED NOT DETECTED Final   Staphylococcus lugdunensis NOT DETECTED NOT DETECTED Final   Streptococcus species DETECTED (A) NOT DETECTED Final    Comment: Not Enterococcus species, Streptococcus agalactiae, Streptococcus pyogenes, or Streptococcus pneumoniae. EMILY STEINBOCK @ 2104 04/07/23 LFD    Streptococcus agalactiae NOT DETECTED NOT DETECTED Final   Streptococcus pneumoniae NOT DETECTED NOT DETECTED Final   Streptococcus pyogenes NOT DETECTED NOT DETECTED Final   A.calcoaceticus-baumannii NOT DETECTED NOT DETECTED Final   Bacteroides fragilis NOT DETECTED NOT DETECTED Final   Enterobacterales NOT DETECTED NOT DETECTED Final   Enterobacter cloacae complex NOT DETECTED NOT DETECTED Final   Escherichia coli NOT DETECTED NOT DETECTED Final   Klebsiella aerogenes NOT DETECTED NOT DETECTED Final   Klebsiella oxytoca NOT DETECTED NOT DETECTED Final   Klebsiella pneumoniae NOT  DETECTED NOT DETECTED Final   Proteus species NOT DETECTED NOT DETECTED Final   Salmonella species NOT DETECTED NOT DETECTED Final   Serratia marcescens NOT DETECTED NOT DETECTED Final   Haemophilus influenzae NOT DETECTED NOT DETECTED Final   Neisseria meningitidis NOT DETECTED NOT DETECTED Final   Pseudomonas aeruginosa NOT DETECTED NOT DETECTED Final   Stenotrophomonas maltophilia NOT DETECTED NOT DETECTED Final   Candida albicans NOT DETECTED NOT DETECTED Final   Candida auris NOT DETECTED NOT DETECTED Final   Candida glabrata NOT DETECTED NOT DETECTED Final   Candida krusei NOT DETECTED NOT DETECTED Final   Candida parapsilosis NOT  DETECTED NOT DETECTED Final   Candida tropicalis NOT DETECTED NOT DETECTED Final   Cryptococcus neoformans/gattii NOT DETECTED NOT DETECTED Final    Comment: Performed at [facility], [address] [place], [place] [address]  Urine Culture     Status: Abnormal   Collection Time: 04/07/23  3:53 AM   Specimen: In/Out Cath Urine  Result Value Ref Range Status   Specimen Description   Final    IN/OUT CATH URINE Performed at [facility], [address], [place], [place] [address]    Special Requests   Final    NONE Performed at [facility], [address] [place], [place] [address]    Culture (A)  Final    3,000 COLONIES/mL ENTEROCOCCUS FAECALIS >=100,000 COLONIES/mL VIRIDANS STREPTOCOCCUS    Report Status 04/10/2023 FINAL  Final   Organism ID, Bacteria ENTEROCOCCUS FAECALIS (A)  Final      Susceptibility   Enterococcus faecalis - MIC*    AMPICILLIN <=2 SENSITIVE Sensitive     VANCOMYCIN 1 SENSITIVE Sensitive     NITROFURANTOIN <=16 SENSITIVE Sensitive     * 3,000 COLONIES/mL ENTEROCOCCUS FAECALIS  Resp panel by RT-PCR (RSV, Flu A&B, Covid) Anterior Nasal Swab     Status: None   Collection Time: 04/07/23  3:53 AM   Specimen: Anterior Nasal Swab  Result Value Ref Range Status   SARS Coronavirus 2 by RT PCR  NEGATIVE NEGATIVE Final    Comment: (NOTE) SARS-CoV-2 target nucleic acids are NOT DETECTED.  The SARS-CoV-2 RNA is generally detectable in upper respiratory specimens during the acute phase of infection. The lowest concentration of SARS-CoV-2 viral copies this assay can detect is 138 copies/mL. A negative result does not preclude SARS-Cov-2 infection and should not be used as the sole basis for treatment or other patient management decisions. A negative result may occur with  improper specimen collection/handling, submission of specimen other than nasopharyngeal swab, presence of viral mutation(s) within the areas targeted by this assay, and inadequate number of viral copies(<138 copies/mL). A negative result must be combined with clinical observations, patient history, and epidemiological information. The expected result is Negative.  Fact Sheet for Patients:  BloggerCourse.com  Fact Sheet for Healthcare Providers:  SeriousBroker.it  This test is no t yet approved or cleared by the [place] FDA and  has been authorized for detection and/or diagnosis of SARS-CoV-2 by FDA under an Emergency Use Authorization (EUA). This EUA will remain  in effect (meaning this test can be used) for the duration of the COVID-19 declaration under Section 564(b)(1) of the Act, 21 U.S.C.section 360bbb-3(b)(1), unless the authorization is terminated  or revoked sooner.       Influenza A by PCR NEGATIVE NEGATIVE Final   Influenza B by PCR NEGATIVE NEGATIVE Final    Comment: (NOTE) The Xpert Xpress SARS-CoV-2/FLU/RSV plus assay is intended as an aid in the diagnosis of influenza from Nasopharyngeal swab specimens and should not be used as a sole basis for treatment. Nasal washings and aspirates are unacceptable for Xpert Xpress SARS-CoV-2/FLU/RSV testing.  Fact Sheet for Patients: BloggerCourse.com  Fact Sheet for  Healthcare Providers: SeriousBroker.it  This test is not yet approved or cleared by the [place] FDA and has been authorized for detection and/or diagnosis of SARS-CoV-2 by FDA under an Emergency Use Authorization (EUA). This EUA will remain in effect (meaning this test can be used) for the duration of the COVID-19 declaration under Section 564(b)(1) of the Act, 21 U.S.C. section 360bbb-3(b)(1), unless  the authorization is terminated or revoked.     Resp Syncytial Virus by PCR NEGATIVE NEGATIVE Final    Comment: (NOTE) Fact Sheet for Patients: BloggerCourse.com  Fact Sheet for Healthcare Providers: SeriousBroker.it  This test is not yet approved or cleared by the [place] FDA and has been authorized for detection and/or diagnosis of SARS-CoV-2 by FDA under an Emergency Use Authorization (EUA). This EUA will remain in effect (meaning this test can be used) for the duration of the COVID-19 declaration under Section 564(b)(1) of the Act, 21 U.S.C. section 360bbb-3(b)(1), unless the authorization is terminated or revoked.  Performed at [facility], [address] [place], [place] [address]   Expectorated Sputum Assessment w Gram Stain, Rflx to Resp Cult     Status: None   Collection Time: 04/09/23 12:24 PM   Specimen: Sputum  Result Value Ref Range Status   Specimen Description SPUTUM  Final   Special Requests NONE  Final   Sputum evaluation   Final    Sputum specimen not acceptable for testing.  Please recollect.   C/JOAN WILLIS AT 1329 04/09/23.PMF Performed at [facility], [address] [place], [place] [address]    Report Status 04/09/2023 FINAL  Final  Culture, blood (Routine X 2) w Reflex to ID Panel     Status: None   Collection Time: 04/10/23  6:10 PM   Specimen: BLOOD  Result Value Ref Range Status   Specimen Description BLOOD RIGHT ANTECUBITAL  Final    Special Requests   Final    BOTTLES DRAWN AEROBIC AND ANAEROBIC Blood Culture adequate volume   Culture   Final    NO GROWTH 5 DAYS Performed at [facility], [address], [place], [place] [address]    Report Status 04/15/2023 FINAL  Final  Culture, blood (Routine X 2) w Reflex to ID Panel     Status: None   Collection Time: 04/10/23  6:10 PM   Specimen: BLOOD RIGHT HAND  Result Value Ref Range Status   Specimen Description BLOOD RIGHT HAND  Final   Special Requests   Final    BOTTLES DRAWN AEROBIC AND ANAEROBIC Blood Culture adequate volume   Culture   Final    NO GROWTH 5 DAYS Performed at [facility], [address], [place], [place] [address]    Report Status 04/15/2023 FINAL  Final    Coagulation Studies: No results for input(s): "LABPROT", "INR" in the last 72 hours.  Urinalysis: No results for input(s): "COLORURINE", "LABSPEC", "PHURINE", "GLUCOSEU", "HGBUR", "BILIRUBINUR", "KETONESUR", "PROTEINUR", "UROBILINOGEN", "NITRITE", "LEUKOCYTESUR" in the last 72 hours.  Invalid input(s): "APPERANCEUR"     Imaging: DG Chest 1 View Result Date: 04/16/2023 CLINICAL DATA:  [id number] Pneumonia [id number] EXAM: CHEST  1 VIEW COMPARISON:  04/06/2023 chest radiograph. FINDINGS: Stable cardiomediastinal silhouette with normal heart size. No pneumothorax. No pleural effusion. Low lung volumes. No overt pulmonary edema. Mild streaky bibasilar lung opacities, similar. Advanced bilateral glenohumeral osteoarthritis. Vascular stent overlies the medial proximal left upper extremity soft tissues. IMPRESSION: Low lung volumes with mild streaky bibasilar lung opacities, similar, favor atelectasis. Electronically Signed   By: Delbert Phenix M.D.   On: 04/16/2023 16:54     Medications:    anticoagulant sodium citrate      ceFAZolin (ANCEF) IV Stopped (04/14/23 1727)    (feeding supplement) PROSource Plus  30 mL Oral BID BM   brimonidine  1 drop Both Eyes BID   carvedilol  6.25  mg Oral BID  Chlorhexidine Gluconate Cloth  6 each Topical Daily   feeding supplement  1 Container Oral TID BM   heparin  5,000 Units Subcutaneous Q12H   multivitamin  1 tablet Oral Daily   nystatin  5 mL Oral QID   mouth rinse  15 mL Mouth Rinse 4 times per day   pantoprazole  40 mg Oral Daily   sertraline  25 mg Oral Daily   torsemide  100 mg Oral Daily   acetaminophen, ALPRAZolam, alteplase, anticoagulant sodium citrate, heparin, hydrALAZINE, lidocaine (PF), lidocaine-prilocaine, ondansetron **OR** ondansetron (ZOFRAN) IV, mouth rinse, pentafluoroprop-tetrafluoroeth, phenol, traZODone  Assessment/ Plan:  [name] is a 89 y.o.  female wth end stage renal disease on hemodialysis, hypertension, congestive heart failure, depression, GERD who presents to [facility] with 04/07/2023 with Hypokalemia [E87.6] Pericardial effusion [I31.39] Pneumonia [J18.9] Failure to thrive in adult [R62.7] Urinary tract infection without hematuria, site unspecified [N39.0] Community acquired pneumonia, unspecified laterality [J18.9]  [facility] Left AVF 43kg  End Stage Renal Disease on hemodialysis: Patient scheduled to receive dialysis later today.  During dialysis, UF turned off due to hypotension.  Next treatment scheduled for Wednesday.  Hypertension with chronic kidney disease: Continue torsemide.  Blood pressure this morning acceptable for this patient.  Hypotension experienced during dialysis however treated with 300 mL normal saline bolus.  Anemia with chronic kidney disease: mircera as outpatient. Hemoglobin 10.5, at goal  Secondary Hyperparathyroidism: currently holding phosphate binders. Gets parsabiv outpatient.  Calcium and phosphorus within optimal range.  5. Streptococcal bacteremia secondary to UTI. Blood cultures and urine cultures positive for strep anginosus. Receiving IV ceftriaxone. ID consulted and recommends Cefazolin 2g/2g/2g with each dialysis until 05/05/2023.  Weekly labs of CBC with diff and CMP requested.   Outpatient clinic has been notified   LOS: 10 Lyanne Kates 12/16/20243:20 PM

## 2023-04-18 DIAGNOSIS — J9811 Atelectasis: Secondary | ICD-10-CM | POA: Diagnosis not present

## 2023-04-18 DIAGNOSIS — Z79899 Other long term (current) drug therapy: Secondary | ICD-10-CM | POA: Diagnosis not present

## 2023-04-18 DIAGNOSIS — R9389 Abnormal findings on diagnostic imaging of other specified body structures: Secondary | ICD-10-CM | POA: Diagnosis not present

## 2023-04-18 DIAGNOSIS — R1312 Dysphagia, oropharyngeal phase: Secondary | ICD-10-CM | POA: Diagnosis not present

## 2023-04-18 DIAGNOSIS — R0689 Other abnormalities of breathing: Secondary | ICD-10-CM | POA: Diagnosis not present

## 2023-04-18 DIAGNOSIS — I120 Hypertensive chronic kidney disease with stage 5 chronic kidney disease or end stage renal disease: Secondary | ICD-10-CM | POA: Diagnosis not present

## 2023-04-18 DIAGNOSIS — N2581 Secondary hyperparathyroidism of renal origin: Secondary | ICD-10-CM | POA: Diagnosis not present

## 2023-04-18 DIAGNOSIS — A409 Streptococcal sepsis, unspecified: Secondary | ICD-10-CM | POA: Diagnosis not present

## 2023-04-18 DIAGNOSIS — Z7189 Other specified counseling: Secondary | ICD-10-CM | POA: Diagnosis not present

## 2023-04-18 DIAGNOSIS — I499 Cardiac arrhythmia, unspecified: Secondary | ICD-10-CM | POA: Diagnosis not present

## 2023-04-18 DIAGNOSIS — J439 Emphysema, unspecified: Secondary | ICD-10-CM | POA: Diagnosis not present

## 2023-04-18 DIAGNOSIS — J9611 Chronic respiratory failure with hypoxia: Secondary | ICD-10-CM | POA: Diagnosis not present

## 2023-04-18 DIAGNOSIS — I517 Cardiomegaly: Secondary | ICD-10-CM | POA: Diagnosis not present

## 2023-04-18 DIAGNOSIS — Z515 Encounter for palliative care: Secondary | ICD-10-CM | POA: Diagnosis not present

## 2023-04-18 DIAGNOSIS — I5032 Chronic diastolic (congestive) heart failure: Secondary | ICD-10-CM | POA: Diagnosis not present

## 2023-04-18 DIAGNOSIS — Z743 Need for continuous supervision: Secondary | ICD-10-CM | POA: Diagnosis not present

## 2023-04-18 DIAGNOSIS — R293 Abnormal posture: Secondary | ICD-10-CM | POA: Diagnosis not present

## 2023-04-18 DIAGNOSIS — E569 Vitamin deficiency, unspecified: Secondary | ICD-10-CM | POA: Diagnosis not present

## 2023-04-18 DIAGNOSIS — E1122 Type 2 diabetes mellitus with diabetic chronic kidney disease: Secondary | ICD-10-CM | POA: Diagnosis not present

## 2023-04-18 DIAGNOSIS — Z87891 Personal history of nicotine dependence: Secondary | ICD-10-CM | POA: Diagnosis not present

## 2023-04-18 DIAGNOSIS — Z8249 Family history of ischemic heart disease and other diseases of the circulatory system: Secondary | ICD-10-CM | POA: Diagnosis not present

## 2023-04-18 DIAGNOSIS — N390 Urinary tract infection, site not specified: Secondary | ICD-10-CM | POA: Diagnosis not present

## 2023-04-18 DIAGNOSIS — R55 Syncope and collapse: Secondary | ICD-10-CM | POA: Diagnosis not present

## 2023-04-18 DIAGNOSIS — F32A Depression, unspecified: Secondary | ICD-10-CM | POA: Diagnosis present

## 2023-04-18 DIAGNOSIS — E119 Type 2 diabetes mellitus without complications: Secondary | ICD-10-CM | POA: Diagnosis not present

## 2023-04-18 DIAGNOSIS — I132 Hypertensive heart and chronic kidney disease with heart failure and with stage 5 chronic kidney disease, or end stage renal disease: Secondary | ICD-10-CM | POA: Diagnosis not present

## 2023-04-18 DIAGNOSIS — E8770 Fluid overload, unspecified: Secondary | ICD-10-CM | POA: Diagnosis not present

## 2023-04-18 DIAGNOSIS — Z741 Need for assistance with personal care: Secondary | ICD-10-CM | POA: Diagnosis not present

## 2023-04-18 DIAGNOSIS — B954 Other streptococcus as the cause of diseases classified elsewhere: Secondary | ICD-10-CM | POA: Diagnosis not present

## 2023-04-18 DIAGNOSIS — R0602 Shortness of breath: Secondary | ICD-10-CM | POA: Diagnosis not present

## 2023-04-18 DIAGNOSIS — I5030 Unspecified diastolic (congestive) heart failure: Secondary | ICD-10-CM | POA: Diagnosis not present

## 2023-04-18 DIAGNOSIS — N186 End stage renal disease: Secondary | ICD-10-CM | POA: Diagnosis not present

## 2023-04-18 DIAGNOSIS — R7881 Bacteremia: Secondary | ICD-10-CM | POA: Diagnosis not present

## 2023-04-18 DIAGNOSIS — E785 Hyperlipidemia, unspecified: Secondary | ICD-10-CM | POA: Diagnosis not present

## 2023-04-18 DIAGNOSIS — I509 Heart failure, unspecified: Secondary | ICD-10-CM | POA: Diagnosis not present

## 2023-04-18 DIAGNOSIS — H409 Unspecified glaucoma: Secondary | ICD-10-CM | POA: Diagnosis not present

## 2023-04-18 DIAGNOSIS — I252 Old myocardial infarction: Secondary | ICD-10-CM | POA: Diagnosis not present

## 2023-04-18 DIAGNOSIS — M6259 Muscle wasting and atrophy, not elsewhere classified, multiple sites: Secondary | ICD-10-CM | POA: Diagnosis not present

## 2023-04-18 DIAGNOSIS — R2681 Unsteadiness on feet: Secondary | ICD-10-CM | POA: Diagnosis not present

## 2023-04-18 DIAGNOSIS — I5022 Chronic systolic (congestive) heart failure: Secondary | ICD-10-CM | POA: Diagnosis not present

## 2023-04-18 DIAGNOSIS — I10 Essential (primary) hypertension: Secondary | ICD-10-CM | POA: Diagnosis not present

## 2023-04-18 DIAGNOSIS — G4700 Insomnia, unspecified: Secondary | ICD-10-CM | POA: Diagnosis not present

## 2023-04-18 DIAGNOSIS — Z681 Body mass index (BMI) 19 or less, adult: Secondary | ICD-10-CM | POA: Diagnosis not present

## 2023-04-18 DIAGNOSIS — J9621 Acute and chronic respiratory failure with hypoxia: Secondary | ICD-10-CM | POA: Diagnosis not present

## 2023-04-18 DIAGNOSIS — J9601 Acute respiratory failure with hypoxia: Secondary | ICD-10-CM | POA: Diagnosis not present

## 2023-04-18 DIAGNOSIS — E669 Obesity, unspecified: Secondary | ICD-10-CM | POA: Diagnosis present

## 2023-04-18 DIAGNOSIS — B955 Unspecified streptococcus as the cause of diseases classified elsewhere: Secondary | ICD-10-CM | POA: Diagnosis not present

## 2023-04-18 DIAGNOSIS — I2510 Atherosclerotic heart disease of native coronary artery without angina pectoris: Secondary | ICD-10-CM | POA: Diagnosis not present

## 2023-04-18 DIAGNOSIS — Q791 Other congenital malformations of diaphragm: Secondary | ICD-10-CM | POA: Diagnosis not present

## 2023-04-18 DIAGNOSIS — F419 Anxiety disorder, unspecified: Secondary | ICD-10-CM | POA: Diagnosis present

## 2023-04-18 DIAGNOSIS — R0989 Other specified symptoms and signs involving the circulatory and respiratory systems: Secondary | ICD-10-CM | POA: Diagnosis not present

## 2023-04-18 DIAGNOSIS — J9620 Acute and chronic respiratory failure, unspecified whether with hypoxia or hypercapnia: Secondary | ICD-10-CM | POA: Diagnosis not present

## 2023-04-18 DIAGNOSIS — R5383 Other fatigue: Secondary | ICD-10-CM | POA: Diagnosis not present

## 2023-04-18 DIAGNOSIS — R6889 Other general symptoms and signs: Secondary | ICD-10-CM | POA: Diagnosis not present

## 2023-04-18 DIAGNOSIS — Z992 Dependence on renal dialysis: Secondary | ICD-10-CM | POA: Diagnosis not present

## 2023-04-18 DIAGNOSIS — E43 Unspecified severe protein-calorie malnutrition: Secondary | ICD-10-CM | POA: Diagnosis not present

## 2023-04-18 DIAGNOSIS — E11649 Type 2 diabetes mellitus with hypoglycemia without coma: Secondary | ICD-10-CM | POA: Diagnosis not present

## 2023-04-18 DIAGNOSIS — Z1152 Encounter for screening for COVID-19: Secondary | ICD-10-CM | POA: Diagnosis not present

## 2023-04-18 DIAGNOSIS — D631 Anemia in chronic kidney disease: Secondary | ICD-10-CM | POA: Diagnosis not present

## 2023-04-18 DIAGNOSIS — R69 Illness, unspecified: Secondary | ICD-10-CM | POA: Diagnosis not present

## 2023-04-18 DIAGNOSIS — K219 Gastro-esophageal reflux disease without esophagitis: Secondary | ICD-10-CM | POA: Diagnosis not present

## 2023-04-18 DIAGNOSIS — I700 Atherosclerosis of aorta: Secondary | ICD-10-CM | POA: Diagnosis not present

## 2023-04-18 LAB — BASIC METABOLIC PANEL
Anion gap: 8 (ref 5–15)
BUN: 34 mg/dL — ABNORMAL HIGH (ref 8–23)
CO2: 28 mmol/L (ref 22–32)
Calcium: 9.1 mg/dL (ref 8.9–10.3)
Chloride: 98 mmol/L (ref 98–111)
Creatinine, Ser: 2.43 mg/dL — ABNORMAL HIGH (ref 0.44–1.00)
GFR, Estimated: 19 mL/min — ABNORMAL LOW (ref >60)
Glucose, Bld: 140 mg/dL — ABNORMAL HIGH (ref 70–99)
Potassium: 3.5 mmol/L (ref 3.5–5.1)
Sodium: 134 mmol/L — ABNORMAL LOW (ref 135–145)

## 2023-04-18 LAB — CULTURE, BLOOD (ROUTINE X 2): Special Requests: ADEQUATE

## 2023-04-18 LAB — MINIMUM INHIBITORY CONC. (1 DRUG)

## 2023-04-18 LAB — MIC RESULT

## 2023-04-18 MED ORDER — CEFAZOLIN IV (FOR PTA / DISCHARGE USE ONLY): 2.0000 g | INTRAVENOUS | 0 refills | Status: DC

## 2023-04-18 MED ORDER — CARVEDILOL 6.25 MG PO TABS: 6.2500 mg | ORAL_TABLET | Freq: Two times a day (BID) | ORAL | Status: DC

## 2023-04-18 MED ORDER — ALPRAZOLAM 0.5 MG PO TABS: 0.5000 mg | ORAL_TABLET | Freq: Three times a day (TID) | ORAL | 0 refills | Status: DC | PRN

## 2023-04-18 NOTE — Care Management Important Message (Signed)
Important Message  Patient Details  Name: Lori Stanley MRN: [id number] Date of Birth: [birth year]   Important Message Given:  Yes - Medicare IM     Olegario Messier A Lamontae Ricardo 04/18/2023, 2:42 PM

## 2023-04-18 NOTE — Progress Notes (Signed)
[facility]  ROUNDING NOTE   Subjective:   [name] was admitted to [facility] on 04/07/2023 for Hypokalemia [E87.6] Pericardial effusion [I31.39] Pneumonia [J18.9] Failure to thrive in adult [R62.7] Urinary tract infection without hematuria, site unspecified [N39.0] Community acquired pneumonia, unspecified laterality [J18.9]   Update: Patient seen sitting in chair, alert and oriented Partially completed breakfast tray at bedside Room air Patient states she feels well  Objective:  Vital signs in last 24 hours:  Temp:  [97.6 F (36.4 C)-98.6 F (37 C)] 98.6 F (37 C) (12/17 0747) Pulse Rate:  [46-91] 64 (12/17 0747) Resp:  [15-31] 15 (12/17 0747) BP: (71-175)/(34-75) 128/43 (12/17 0747) SpO2:  [84 %-100 %] 100 % (12/17 0747) Weight:  [39 kg] 39 kg (12/16 1739)  Weight change:  Filed Weights   04/14/23 1332 04/17/23 1344 04/17/23 1739  Weight: 39.7 kg 39 kg 39 kg    Intake/Output: I/O last 3 completed shifts: In: 840 [P.O.:840] Out: 200 [Other:200]   Intake/Output this shift:  Total I/O In: 120 [P.O.:120] Out: -   Physical Exam: General: NAD  Head: +dysphonic voice   Eyes: Anicteric  Lungs:  Clear to auscultation, normal effort  Heart: Regular rate and rhythm  Abdomen:  Soft, nontender  Extremities:  no peripheral edema.  Neurologic: Alert and oriented, moving all four extremities  Skin: No lesions  Access: Left AVF    Basic Metabolic Panel: Recent Labs  Lab 04/12/23 0803 04/13/23 0424 04/14/23 0422 04/16/23 0446 04/16/23 1022 04/17/23 0413 04/18/23 0408  NA 140 136 135 138  --  136 134*  K 4.2 4.0 3.9 3.9  --  3.8 3.5  CL 103 98 98 100  --  101 98  CO2 29 30 31 28   --  26 28  GLUCOSE 83 70 77 88  --  95 140*  BUN 28* 13 26* 43*  --  53* 34*  CREATININE 2.55* 1.88* 2.70* 3.06*  --  3.55* 2.43*  CALCIUM 9.3 9.3 8.9 8.9  --  9.1 9.1  MG 1.9 2.2 1.9  --   --  1.9  --   PHOS 3.6  --  3.9  --  4.2 4.6  --     Liver  Function Tests: Recent Labs  Lab 04/13/23 0424 04/14/23 0422 04/17/23 0413  AST 16 13*  --   ALT 10 6  --   ALKPHOS 161* 138*  --   BILITOT 0.8 0.6  --   PROT 5.8* 5.4*  --   ALBUMIN 2.7* 2.6* 2.5*   No results for input(s): "LIPASE", "AMYLASE" in the last 168 hours.  No results for input(s): "AMMONIA" in the last 168 hours.  CBC: Recent Labs  Lab 04/12/23 0803 04/13/23 0424 04/14/23 0422 04/16/23 1022 04/17/23 1409  WBC 7.6 6.3 5.9 6.4 6.7  NEUTROABS  --  5.0  --   --   --   HGB 11.4* 11.9* 10.6* 12.6 10.5*  HCT 37.0 37.7 33.9* 40.3 33.6*  MCV 88.9 87.7 89.0 88.0 87.3  PLT 252 230 225 222 235    Cardiac Enzymes: No results for input(s): "CKTOTAL", "CKMB", "CKMBINDEX", "TROPONINI" in the last 168 hours.  BNP: Invalid input(s): "POCBNP"  CBG: No results for input(s): "GLUCAP" in the last 168 hours.  Microbiology: Results for orders placed or performed during the hospital encounter of 04/07/23  Culture, blood (routine x 2)     Status: None   Collection Time: 04/07/23  3:04 AM   Specimen:  BLOOD  Result Value Ref Range Status   Specimen Description BLOOD BLOOD RIGHT ARM  Final   Special Requests   Final    BOTTLES DRAWN AEROBIC AND ANAEROBIC Blood Culture adequate volume   Culture   Final    NO GROWTH 5 DAYS Performed at [facility], [address], [place], [place] [address]    Report Status 04/12/2023 FINAL  Final  Culture, blood (routine x 2)     Status: Abnormal (Preliminary result)   Collection Time: 04/07/23  3:40 AM   Specimen: BLOOD  Result Value Ref Range Status   Specimen Description   Final    BLOOD BLOOD RIGHT FOREARM Performed at [facility], [address] [place], [place] [address]    Special Requests   Final    BOTTLES DRAWN AEROBIC AND ANAEROBIC Blood Culture adequate volume Performed at [facility], [address] [place], [place] [address]    Culture  Setup Time   Final    GRAM POSITIVE  COCCI ANAEROBIC BOTTLE ONLY CRITICAL RESULT CALLED TO, READ BACK BY AND VERIFIED WITH: EMILY STEINBOCK @ 2104 04/07/23 LFD     Culture (A)  Final    STREPTOCOCCUS ANGINOSIS Sent to [facility] for further susceptibility testing. Performed at [facility], [address], [place], [place] [address]    Report Status PENDING  Incomplete   Organism ID, Bacteria STREPTOCOCCUS ANGINOSIS  Final      Susceptibility   Streptococcus anginosis - MIC*    PENICILLIN 0.5 INTERMEDIATE Intermediate     CEFTRIAXONE <=0.12 SENSITIVE Sensitive     ERYTHROMYCIN <=0.12 SENSITIVE Sensitive     VANCOMYCIN <=0.12 SENSITIVE Sensitive     * STREPTOCOCCUS ANGINOSIS  Blood Culture ID Panel (Reflexed)     Status: Abnormal   Collection Time: 04/07/23  3:40 AM  Result Value Ref Range Status   Enterococcus faecalis NOT DETECTED NOT DETECTED Final   Enterococcus Faecium NOT DETECTED NOT DETECTED Final   Listeria monocytogenes NOT DETECTED NOT DETECTED Final   Staphylococcus species NOT DETECTED NOT DETECTED Final   Staphylococcus aureus (BCID) NOT DETECTED NOT DETECTED Final   Staphylococcus epidermidis NOT DETECTED NOT DETECTED Final   Staphylococcus lugdunensis NOT DETECTED NOT DETECTED Final   Streptococcus species DETECTED (A) NOT DETECTED Final    Comment: Not Enterococcus species, Streptococcus agalactiae, Streptococcus pyogenes, or Streptococcus pneumoniae. EMILY STEINBOCK @ 2104 04/07/23 LFD    Streptococcus agalactiae NOT DETECTED NOT DETECTED Final   Streptococcus pneumoniae NOT DETECTED NOT DETECTED Final   Streptococcus pyogenes NOT DETECTED NOT DETECTED Final   A.calcoaceticus-baumannii NOT DETECTED NOT DETECTED Final   Bacteroides fragilis NOT DETECTED NOT DETECTED Final   Enterobacterales NOT DETECTED NOT DETECTED Final   Enterobacter cloacae complex NOT DETECTED NOT DETECTED Final   Escherichia coli NOT DETECTED NOT DETECTED Final   Klebsiella aerogenes NOT DETECTED NOT DETECTED Final    Klebsiella oxytoca NOT DETECTED NOT DETECTED Final   Klebsiella pneumoniae NOT DETECTED NOT DETECTED Final   Proteus species NOT DETECTED NOT DETECTED Final   Salmonella species NOT DETECTED NOT DETECTED Final   Serratia marcescens NOT DETECTED NOT DETECTED Final   Haemophilus influenzae NOT DETECTED NOT DETECTED Final   Neisseria meningitidis NOT DETECTED NOT DETECTED Final   Pseudomonas aeruginosa NOT DETECTED NOT DETECTED Final   Stenotrophomonas maltophilia NOT DETECTED NOT DETECTED Final   Candida albicans NOT DETECTED NOT DETECTED Final   Candida auris NOT DETECTED NOT DETECTED Final   Candida glabrata  NOT DETECTED NOT DETECTED Final   Candida krusei NOT DETECTED NOT DETECTED Final   Candida parapsilosis NOT DETECTED NOT DETECTED Final   Candida tropicalis NOT DETECTED NOT DETECTED Final   Cryptococcus neoformans/gattii NOT DETECTED NOT DETECTED Final    Comment: Performed at [facility], [address] [place], [place] [address]  MIC (1 Drug)-Blood culture; 04/07/2023; BLOOD; Streptococcus anginosis; Other; cefazolin; Patient immune status: Normal     Status: None   Collection Time: 04/07/23  3:40 AM   Specimen: BLOOD  Result Value Ref Range Status   Min Inhibitory Conc (1 Drug) Preliminary report  Final    Comment: (NOTE) Performed At: [facility] [address] [place], [place] [id number] Jolene Schimke MD XB:[id number]    Source BLOOD  Final    Comment: Performed at [facility], [address], [place], [place] [address]  Urine Culture     Status: Abnormal   Collection Time: 04/07/23  3:53 AM   Specimen: In/Out Cath Urine  Result Value Ref Range Status   Specimen Description   Final    IN/OUT CATH URINE Performed at [facility], [address] [place], [place] [address]    Special Requests   Final    NONE Performed at [facility], [address] [place], [place] [address]    Culture (A)  Final    3,000 COLONIES/mL  ENTEROCOCCUS FAECALIS >=100,000 COLONIES/mL VIRIDANS STREPTOCOCCUS    Report Status 04/10/2023 FINAL  Final   Organism ID, Bacteria ENTEROCOCCUS FAECALIS (A)  Final      Susceptibility   Enterococcus faecalis - MIC*    AMPICILLIN <=2 SENSITIVE Sensitive     VANCOMYCIN 1 SENSITIVE Sensitive     NITROFURANTOIN <=16 SENSITIVE Sensitive     * 3,000 COLONIES/mL ENTEROCOCCUS FAECALIS  Resp panel by RT-PCR (RSV, Flu A&B, Covid) Anterior Nasal Swab     Status: None   Collection Time: 04/07/23  3:53 AM   Specimen: Anterior Nasal Swab  Result Value Ref Range Status   SARS Coronavirus 2 by RT PCR NEGATIVE NEGATIVE Final    Comment: (NOTE) SARS-CoV-2 target nucleic acids are NOT DETECTED.  The SARS-CoV-2 RNA is generally detectable in upper respiratory specimens during the acute phase of infection. The lowest concentration of SARS-CoV-2 viral copies this assay can detect is 138 copies/mL. A negative result does not preclude SARS-Cov-2 infection and should not be used as the sole basis for treatment or other patient management decisions. A negative result may occur with  improper specimen collection/handling, submission of specimen other than nasopharyngeal swab, presence of viral mutation(s) within the areas targeted by this assay, and inadequate number of viral copies(<138 copies/mL). A negative result must be combined with clinical observations, patient history, and epidemiological information. The expected result is Negative.  Fact Sheet for Patients:  BloggerCourse.com  Fact Sheet for Healthcare Providers:  SeriousBroker.it  This test is no t yet approved or cleared by the [place] FDA and  has been authorized for detection and/or diagnosis of SARS-CoV-2 by FDA under an Emergency Use Authorization (EUA). This EUA will remain  in effect (meaning this test can be used) for the duration of the COVID-19 declaration under Section  564(b)(1) of the Act, 21 U.S.C.section 360bbb-3(b)(1), unless the authorization is terminated  or revoked sooner.       Influenza A by PCR NEGATIVE NEGATIVE Final   Influenza B by PCR NEGATIVE NEGATIVE Final    Comment: (NOTE) The Xpert Xpress SARS-CoV-2/FLU/RSV  plus assay is intended as an aid in the diagnosis of influenza from Nasopharyngeal swab specimens and should not be used as a sole basis for treatment. Nasal washings and aspirates are unacceptable for Xpert Xpress SARS-CoV-2/FLU/RSV testing.  Fact Sheet for Patients: BloggerCourse.com  Fact Sheet for Healthcare Providers: SeriousBroker.it  This test is not yet approved or cleared by the [place] FDA and has been authorized for detection and/or diagnosis of SARS-CoV-2 by FDA under an Emergency Use Authorization (EUA). This EUA will remain in effect (meaning this test can be used) for the duration of the COVID-19 declaration under Section 564(b)(1) of the Act, 21 U.S.C. section 360bbb-3(b)(1), unless the authorization is terminated or revoked.     Resp Syncytial Virus by PCR NEGATIVE NEGATIVE Final    Comment: (NOTE) Fact Sheet for Patients: BloggerCourse.com  Fact Sheet for Healthcare Providers: SeriousBroker.it  This test is not yet approved or cleared by the [place] FDA and has been authorized for detection and/or diagnosis of SARS-CoV-2 by FDA under an Emergency Use Authorization (EUA). This EUA will remain in effect (meaning this test can be used) for the duration of the COVID-19 declaration under Section 564(b)(1) of the Act, 21 U.S.C. section 360bbb-3(b)(1), unless the authorization is terminated or revoked.  Performed at [facility], [address] [place], [place] [address]   Expectorated Sputum Assessment w Gram Stain, Rflx to Resp Cult     Status: None   Collection Time: 04/09/23  12:24 PM   Specimen: Sputum  Result Value Ref Range Status   Specimen Description SPUTUM  Final   Special Requests NONE  Final   Sputum evaluation   Final    Sputum specimen not acceptable for testing.  Please recollect.   C/JOAN WILLIS AT 1329 04/09/23.PMF Performed at [facility], [address] [place], [place] [address]    Report Status 04/09/2023 FINAL  Final  Culture, blood (Routine X 2) w Reflex to ID Panel     Status: None   Collection Time: 04/10/23  6:10 PM   Specimen: BLOOD  Result Value Ref Range Status   Specimen Description BLOOD RIGHT ANTECUBITAL  Final   Special Requests   Final    BOTTLES DRAWN AEROBIC AND ANAEROBIC Blood Culture adequate volume   Culture   Final    NO GROWTH 5 DAYS Performed at [facility], [address] [place], [place] [address]    Report Status 04/15/2023 FINAL  Final  Culture, blood (Routine X 2) w Reflex to ID Panel     Status: None   Collection Time: 04/10/23  6:10 PM   Specimen: BLOOD RIGHT HAND  Result Value Ref Range Status   Specimen Description BLOOD RIGHT HAND  Final   Special Requests   Final    BOTTLES DRAWN AEROBIC AND ANAEROBIC Blood Culture adequate volume   Culture   Final    NO GROWTH 5 DAYS Performed at [facility], [address], [place], [place] [address]    Report Status 04/15/2023 FINAL  Final    Coagulation Studies: No results for input(s): "LABPROT", "INR" in the last 72 hours.  Urinalysis: No results for input(s): "COLORURINE", "LABSPEC", "PHURINE", "GLUCOSEU", "HGBUR", "BILIRUBINUR", "KETONESUR", "PROTEINUR", "UROBILINOGEN", "NITRITE", "LEUKOCYTESUR" in the last 72 hours.  Invalid input(s): "APPERANCEUR"     Imaging: DG Chest 1 View Result Date: 04/16/2023 CLINICAL DATA:  [id number] Pneumonia [id number] EXAM: CHEST  1 VIEW COMPARISON:  04/06/2023 chest radiograph. FINDINGS: Stable cardiomediastinal silhouette with normal heart size.  No pneumothorax. No pleural effusion. Low  lung volumes. No overt pulmonary edema. Mild streaky bibasilar lung opacities, similar. Advanced bilateral glenohumeral osteoarthritis. Vascular stent overlies the medial proximal left upper extremity soft tissues. IMPRESSION: Low lung volumes with mild streaky bibasilar lung opacities, similar, favor atelectasis. Electronically Signed   By: Delbert Phenix M.D.   On: 04/16/2023 16:54     Medications:     ceFAZolin (ANCEF) IV Stopped (04/17/23 1739)    (feeding supplement) PROSource Plus  30 mL Oral BID BM   brimonidine  1 drop Both Eyes BID   carvedilol  6.25 mg Oral BID   Chlorhexidine Gluconate Cloth  6 each Topical Daily   feeding supplement  1 Container Oral TID BM   heparin  5,000 Units Subcutaneous Q12H   multivitamin  1 tablet Oral Daily   nystatin  5 mL Oral QID   mouth rinse  15 mL Mouth Rinse 4 times per day   pantoprazole  40 mg Oral Daily   sertraline  25 mg Oral Daily   torsemide  100 mg Oral Daily   acetaminophen, ALPRAZolam, hydrALAZINE, ondansetron **OR** ondansetron (ZOFRAN) IV, mouth rinse, phenol, traZODone  Assessment/ Plan:  [name] is a 89 y.o.  female wth end stage renal disease on hemodialysis, hypertension, congestive heart failure, depression, GERD who presents to [facility] with 04/07/2023 with Hypokalemia [E87.6] Pericardial effusion [I31.39] Pneumonia [J18.9] Failure to thrive in adult [R62.7] Urinary tract infection without hematuria, site unspecified [N39.0] Community acquired pneumonia, unspecified laterality [J18.9]  [facility] Left AVF 43kg  End Stage Renal Disease on hemodialysis:   Next treatment scheduled for Wednesday.  Hypertension with chronic kidney disease: Continue torsemide.  Blood pressure stable, 128/43.  Anemia with chronic kidney disease: mircera as outpatient. Hemoglobin 10.5 on 04/17/2023.  Secondary Hyperparathyroidism: currently holding phosphate binders. Gets parsabiv outpatient.  Calcium remains at  goal  5. Streptococcal bacteremia secondary to UTI. Blood cultures and urine cultures positive for strep anginosus. Receiving IV ceftriaxone. ID consulted and recommends Cefazolin 2g/2g/2g with each dialysis until 05/05/2023. Weekly labs of CBC with diff and CMP requested.   Outpatient clinic has been notified.  Will continue during this admission   LOS: 11 Macrina Lehnert 12/17/20242:24 PM

## 2023-04-18 NOTE — TOC Transition Note (Signed)
Transition of Care [facility]) - Discharge Note   Patient Details  Name: Lori Stanley MRN: [id number] Date of Birth: 30-May-1931  Transition [facility] CM/SW Contact:  Garret Reddish, RN Phone Number: 04/18/2023, 3:23 PM   Clinical Narrative:    Chart reviewed.  Noted that patient has orders for discharge today.  I have spoken with Tammy with [facility].  She informs me that patient patient is able to come to [facility] today.  I have spoken with Amy from [facility] and she has approved patient to go to [facility].  Approval date is 04-18-2023-04-20-2023. Next review date is 04-20-2023.  Reference number is [id number].  I have made Tammy with [facility] aware.   I have sent [facility] Patient's Discharge Summary, Discharge orders, and SNF Transfer Report vi Epic hub.    I have informed patient her niece Rhea Belton that patient will be discharged to [facility] today. I have informed patient her [place] that [facility] will transport patient to the facility today.    I have arranged EMS transport with [facility].    I have informed staff nurse of the above information.      Final next level of care: Skilled Nursing Facility Barriers to Discharge: No Barriers Identified   Patient Goals and CMS Choice   CMS Medicare.gov Compare Post Acute Care list provided to:: Patient Choice offered to / list presented to : Patient      Discharge Placement              Patient chooses bed at: [facility] [place]   Name of family member notified: Glendale Chard ( patient's niece) Patient and family notified of of transfer: 04/18/23  Discharge Plan and Services Additional resources added to the After Visit Summary for                                       Social Drivers of Health (SDOH) Interventions SDOH Screenings   Food Insecurity: No Food Insecurity (04/08/2023)  Housing: Low Risk  (04/15/2023)  Transportation Needs: No  Transportation Needs (04/08/2023)  Utilities: Not At Risk (04/08/2023)  Alcohol Screen: Low Risk  (02/02/2023)  Depression (PHQ2-9): Low Risk  (04/06/2023)  Financial Resource Strain: Low Risk  (11/01/2022)  Physical Activity: Inactive (11/01/2022)  Social Connections: Socially Isolated (11/01/2022)  Stress: No Stress Concern Present (11/01/2022)  Tobacco Use: Medium Risk (04/06/2023)     Readmission Risk Interventions    04/09/2023    2:34 PM  Readmission Risk Prevention Plan  Transportation Screening Complete  PCP or Specialist Appt within 5-7 Days Complete  Home Care Screening Complete  Medication Review (RN CM) Complete

## 2023-04-18 NOTE — Consult Note (Signed)
PHARMACY CONSULT NOTE - ELECTROLYTES  Pharmacy Consult for Electrolyte Monitoring and Replacement   Recent Labs: Height: 5\' 2"  (157.5 cm) Weight: 39 kg (85 lb 15.7 oz) IBW/kg (Calculated) : 50.1 Estimated Creatinine Clearance: 9.7 mL/min (A) (by C-G formula based on SCr of 2.43 mg/dL (H)).  Potassium (mmol/L)  Date Value  04/18/2023 3.5  05/15/2013 4.0   Magnesium (mg/dL)  Date Value  81/19/1478 1.9   Calcium (mg/dL)  Date Value  29/56/2130 9.1   Calcium, Total (mg/dL)  Date Value  86/57/8469 8.0 (L)   Albumin (g/dL)  Date Value  62/95/2841 2.5 (L)  04/06/2023 3.8  05/11/2013 3.6   Phosphorus (mg/dL)  Date Value  32/44/0102 4.6   Sodium (mmol/L)  Date Value  04/18/2023 134 (L)  04/06/2023 141  05/15/2013 132 (L)   Assessment  Lori Stanley is a 89 y.o. female presenting with failure to thrive. Pharmacy has been consulted to monitor and replace electrolytes. ESRD on hemodialysis.  Pertinent medications: torsemide 100 mg po daily Goal of Therapy: Electrolytes WNL  Plan:  No replacement needed F/u with AM labs.   Thank you for allowing pharmacy to be a part of this patient's care.  Lori Stanley PharmD, BCPS 04/18/2023 8:15 AM

## 2023-04-18 NOTE — Plan of Care (Signed)

## 2023-04-18 NOTE — Progress Notes (Signed)
Report given to [facility]. Awaiting EMS pickup.

## 2023-04-18 NOTE — Discharge Summary (Signed)
Physician Discharge Summary  Lori Stanley ZOX:[id number] DOB: October 13, 1931 DOA: 04/07/2023  PCP: [name], MD  Admit date: 04/07/2023 Discharge date: 04/18/2023  Admitted From: Home [facility]position: [facility]  Recommendations for Outpatient Follow-up:  Follow up with PCP in 1-2 weeks Follow-up with infectious disease as scheduled on 04/27/2023 at 11:45 AM Continue cefazolin 2 g IV with HD MWF with projected end date 05/05/2023  Discharge Condition: Stable CODE STATUS: Full code Diet recommendation: Renal diet with 1200 mL fluid restriction  History of present illness:  Lori Stanley is a 89 y.o. female with past medical history significant for chronic hypoxic respiratory failure on 2 L nasal cannula at baseline, chronic diastolic congestive heart failure, HTN, ESRD on HD MWF, gastritis who presented to [facility] ED on 12/6 with difficulty swallowing, progressive weight loss, adult failure to thrive.  Workup in the ED notable for urinalysis suggestive of urinary tract infection.  Patient was admitted for further evaluation and management.     Urine culture 12/5 with Streptococcus anginosus and blood culture also became positive on 12/6 with same organism.  Patient was treated with IV ceftriaxone.     Infectious disease was consulted and followed during hospital course.  TTE was performed with no concerns for infective endocarditis.  TEE was considered but given cervical spine disease and difficulty swallowing this was determined to be high risk procedure.     Patient was seen by ENT, Dr. [name]; transnasal flexible laryngoscopy was performed revealing some mild edema with recommendation of a short course of systemic steroids.  Hospital course:  Streptococcus anginosus septicemia/UTI Patient presenting to the ED with progressive weight loss, weakness/fatigue, adult failure to thrive.  Urinalysis consistent with urinary tract infection patient was started on empiric  antibiotics.  Urine culture positive for Streptococcus anginosus and blood cultures also positive for same organism.  Was seen by infectious disease; TTE with LVEF 60 to 65%, severe asymmetric LVH, grade 1 diastolic dysfunction, no vegetations reported.  Consider TEE but given her cervical spine disease high risk for complications and deferred.  Repeat blood cultures 12/9 with no growth.  Continue cefazolin 2 g IV with HD MWF (projected end date 05/05/2023). Outpatient follow-up with ID 12/26 at 11:45 AM   Dysphagia 2/2 laryngeal edema Patient was seen by ENT, transnasal flexible laryngoscopy was performed revealing some mild edema with recommendation of a short course of systemic steroids in which she completed inpatient.   ESRD on HD MWF Nephrology was consulted and followed during hospital course.  Continue HD on a Monday/Wednesday/Friday schedule.   Essential hypertension Chronic diastolic congestive heart failure TTE with LVEF 60 to 65%, severe asymmetric LVH, grade 1 diastolic dysfunction, Carvedilol 6.25 mg p.o. twice daily, Torsemide 100 mg p.o. daily   Chronic hypoxic respiratory failure Continue home supplemental oxygen, 2 L per nasal cannula.   Type 2 diabetes mellitus Hemoglobin A1c 5.0% 2023.  Diet controlled at baseline.   Chronic diastolic congestive heart failure, compensated Low-sodium diet.  Continue volume management with HD.  Recommend continue to monitor weights intermittently.   Anxiety Sertraline 25 mg p.o. daily, Trazodone 100 mg p.o. at bedtime PRN insomnia, Alprazolam 0.5 mg TID PRN anxiety   GERD/gastritis: Continue omeprazole  Discharge Diagnoses:  Principal Problem:   Streptococcal bacteremia Active Problems:   UTI (urinary tract infection)   ESRD (end stage renal disease) (HCC)   Dysphagia   Essential hypertension   Type 2 diabetes mellitus without complication, without long-term current use of insulin (HCC)  Chronic diastolic CHF (congestive heart  failure) (HCC)   Anxiety    Discharge Instructions  Discharge Instructions     Call MD for:  difficulty breathing, headache or visual disturbances   Complete by: As directed    Call MD for:  extreme fatigue   Complete by: As directed    Call MD for:  persistant dizziness or light-headedness   Complete by: As directed    Call MD for:  persistant nausea and vomiting   Complete by: As directed    Call MD for:  severe uncontrolled pain   Complete by: As directed    Call MD for:  temperature >100.4   Complete by: As directed    Diet - low sodium heart healthy   Complete by: As directed    Home infusion instructions   Complete by: As directed    Instructions: Flushing of vascular access device: 0.9% NaCl pre/post medication administration and prn patency; Heparin 100 u/ml, 5ml for implanted ports and Heparin 10u/ml, 5ml for all other central venous catheters.   Increase activity slowly   Complete by: As directed       Allergies as of 04/18/2023       Reactions   Antihistamines, Chlorpheniramine-type Other (See Comments)   Unknown reaction.   Other Other (See Comments)   Paxil [paroxetine]    "makes her feel funny" not in a good way   Codeine Rash, Other (See Comments)   Mouth sore        Medication List     STOP taking these medications    hydrOXYzine 10 MG tablet Commonly known as: ATARAX       TAKE these medications    acetaminophen 500 MG tablet Commonly known as: TYLENOL Take 500-1,000 mg by mouth every 6 (six) hours as needed for mild pain or fever.   albuterol 108 (90 Base) MCG/ACT inhaler Commonly known as: VENTOLIN HFA Inhale 2 puffs into the lungs every 6 (six) hours as needed for wheezing or shortness of breath.   ALPRAZolam 0.5 MG tablet Commonly known as: XANAX Take 1 tablet (0.5 mg total) by mouth 3 (three) times daily as needed for anxiety.   brimonidine 0.2 % ophthalmic solution Commonly known as: ALPHAGAN Place 1 drop into both eyes 2  (two) times daily.   carvedilol 6.25 MG tablet Commonly known as: COREG Take 1 tablet (6.25 mg total) by mouth 2 (two) times daily. What changed:  medication strength how much to take   ceFAZolin IVPB Commonly known as: ANCEF Inject 2 g into the vein every Monday, Wednesday, and Friday with hemodialysis for 21 days. Indication:  S. Anginosus bacteremia Last Day of Therapy:  05/05/2023 Labs - Once weekly:  CBC/D and CMP Fax weekly lab results  promptly to [phone] To be administered at HD clinic Start taking on: April 19, 2023   fluticasone 50 MCG/ACT nasal spray Commonly known as: FLONASE SPRAY 1 SPRAY INTO BOTH NOSTRILS DAILY. What changed: See the new instructions.   loratadine 10 MG tablet Commonly known as: CLARITIN Take 1 tablet (10 mg total) by mouth daily. What changed:  when to take this reasons to take this   multivitamin Tabs tablet Take 1 tablet by mouth daily.   omeprazole 40 MG capsule Commonly known as: PRILOSEC Take 1 capsule (40 mg total) by mouth daily.   sertraline 25 MG tablet Commonly known as: ZOLOFT TAKE 1 TABLET BY MOUTH EVERY DAY   torsemide 100 MG tablet Commonly known as:  DEMADEX TAKE 1 TABLET BY MOUTH EVERY DAY   traZODone 150 MG tablet Commonly known as: DESYREL TAKE 1 TABLET BY MOUTH EVERYDAY AT BEDTIME What changed: See the new instructions.               Home Infusion Instuctions  (From admission, onward)           Start     Ordered   04/18/23 0000  Home infusion instructions       Question:  Instructions  Answer:  Flushing of vascular access device: 0.9% NaCl pre/post medication administration and prn patency; Heparin 100 u/ml, 5ml for implanted ports and Heparin 10u/ml, 5ml for all other central venous catheters.   04/18/23 1308            Follow-up Information     Simmons-Robinson, Makiera, MD. Schedule an appointment as soon as possible for a visit in 1 week(s).   Specialty: Family  Medicine Contact information: [address] [place] [address] [phone]                Allergies  Allergen Reactions   Antihistamines, Chlorpheniramine-Type Other (See Comments)    Unknown reaction.   Other Other (See Comments)   Paxil [Paroxetine]     "makes her feel funny" not in a good way   Codeine Rash and Other (See Comments)    Mouth sore    Consultations: Nephrology Infectious disease   Procedures/Studies: DG Chest 1 View Result Date: 04/16/2023 CLINICAL DATA:  [id number] Pneumonia [id number] EXAM: CHEST  1 VIEW COMPARISON:  04/06/2023 chest radiograph. FINDINGS: Stable cardiomediastinal silhouette with normal heart size. No pneumothorax. No pleural effusion. Low lung volumes. No overt pulmonary edema. Mild streaky bibasilar lung opacities, similar. Advanced bilateral glenohumeral osteoarthritis. Vascular stent overlies the medial proximal left upper extremity soft tissues. IMPRESSION: Low lung volumes with mild streaky bibasilar lung opacities, similar, favor atelectasis. Electronically Signed   By: Delbert Phenix M.D.   On: 04/16/2023 16:54   ECHOCARDIOGRAM COMPLETE Result Date: 04/13/2023    ECHOCARDIOGRAM REPORT   Patient Name:   SHEENAMARIE GOHEEN Date of Exam: 04/13/2023 Medical Rec #:  [id number]         Height:       62.0 in Accession #:    [id number]        Weight:       87.7 lb Date of Birth:  January 15, 1932        BSA:          1.347 m Patient Age:    89 years          BP:           132/62 mmHg Patient Gender: F                 HR:           70 bpm. Exam Location:  [facility] Procedure: 2D Echo, Cardiac Doppler and Color Doppler Indications:     Bacteremia R78.81  History:         Patient has prior history of Echocardiogram examinations, most                  recent 01/06/2022. Previous Myocardial Infarction; Risk                  Factors:Hypertension and Dyslipidemia. CKD-stage 5.  Sonographer:     Cristela Blue Referring Phys:  [id number] Lynn Ito  Diagnosing Phys: Alwyn Pea MD IMPRESSIONS  1. Left ventricular ejection fraction, by estimation, is 60 to 65%. The left ventricle has normal function. The left ventricle has no regional wall motion abnormalities. There is severe asymmetric left ventricular hypertrophy of the septal segment. Left  ventricular diastolic parameters are consistent with Grade I diastolic dysfunction (impaired relaxation).  2. Right ventricular systolic function is normal. The right ventricular size is normal. Mildly increased right ventricular wall thickness.  3. The mitral valve is degenerative. Mild mitral valve regurgitation.  4. The tricuspid valve is degenerative.  5. The aortic valve is calcified. Aortic valve regurgitation is trivial. Aortic valve sclerosis is present, with no evidence of aortic valve stenosis. FINDINGS  Left Ventricle: Left ventricular ejection fraction, by estimation, is 60 to 65%. The left ventricle has normal function. The left ventricle has no regional wall motion abnormalities. The left ventricular internal cavity size was normal in size. There is  severe asymmetric left ventricular hypertrophy of the septal segment. Left ventricular diastolic parameters are consistent with Grade I diastolic dysfunction (impaired relaxation). Right Ventricle: The right ventricular size is normal. Mildly increased right ventricular wall thickness. Right ventricular systolic function is normal. Left Atrium: Left atrial size was normal in size. Right Atrium: Right atrial size was normal in size. Pericardium: There is no evidence of pericardial effusion. Mitral Valve: The mitral valve is degenerative in appearance. Mild mitral valve regurgitation. MV peak gradient, 10.6 mmHg. The mean mitral valve gradient is 4.0 mmHg. Tricuspid Valve: The tricuspid valve is degenerative in appearance. Tricuspid valve regurgitation is mild. Aortic Valve: The aortic valve is calcified. Aortic valve regurgitation is trivial. Aortic valve  sclerosis is present, with no evidence of aortic valve stenosis. Aortic valve mean gradient measures 24.3 mmHg. Aortic valve peak gradient measures 38.5 mmHg. Aortic valve area, by VTI measures 1.13 cm. Pulmonic Valve: The pulmonic valve was normal in structure. Pulmonic valve regurgitation is not visualized. Aorta: The ascending aorta was not well visualized. IAS/Shunts: No atrial level shunt detected by color flow Doppler.  LEFT VENTRICLE PLAX 2D LVIDd:         3.40 cm   Diastology LVIDs:         2.30 cm   LV e' medial:    3.70 cm/s LV PW:         1.30 cm   LV E/e' medial:  28.1 LV IVS:        2.00 cm   LV e' lateral:   4.24 cm/s LVOT diam:     2.00 cm   LV E/e' lateral: 24.5 LV SV:         79 LV SV Index:   59 LVOT Area:     3.14 cm  RIGHT VENTRICLE RV Basal diam:  2.90 cm RV Mid diam:    2.60 cm RV S prime:     14.00 cm/s TAPSE (M-mode): 2.4 cm LEFT ATRIUM             Index        RIGHT ATRIUM           Index LA diam:        3.90 cm 2.90 cm/m   RA Area:     13.50 cm LA Vol (A2C):   79.7 ml 59.17 ml/m  RA Volume:   37.50 ml  27.84 ml/m LA Vol (A4C):   99.9 ml 74.17 ml/m LA Biplane Vol: 97.0 ml 72.02 ml/m  AORTIC VALVE AV Area (Vmax):    1.07 cm AV Area (Vmean):  1.08 cm AV Area (VTI):     1.13 cm AV Vmax:           310.33 cm/s AV Vmean:          234.000 cm/s AV VTI:            0.703 m AV Peak Grad:      38.5 mmHg AV Mean Grad:      24.3 mmHg LVOT Vmax:         106.00 cm/s LVOT Vmean:        80.500 cm/s LVOT VTI:          0.253 m LVOT/AV VTI ratio: 0.36  AORTA Ao Root diam: 2.70 cm MITRAL VALVE                TRICUSPID VALVE MV Area (PHT): 2.50 cm     TR Peak grad:   14.3 mmHg MV Area VTI:   2.19 cm     TR Vmax:        189.00 cm/s MV Peak grad:  10.6 mmHg MV Mean grad:  4.0 mmHg     SHUNTS MV Vmax:       1.63 m/s     Systemic VTI:  0.25 m MV Vmean:      96.2 cm/s    Systemic Diam: 2.00 cm MV Decel Time: 303 msec MV E velocity: 104.00 cm/s MV A velocity: 143.00 cm/s MV E/A ratio:  0.73 Dwayne D  Callwood MD Electronically signed by Alwyn Pea MD Signature Date/Time: 04/13/2023/2:29:10 PM    Final    CT SOFT TISSUE NECK WO CONTRAST Result Date: 04/07/2023 CLINICAL DATA:  Enlarged salivary gland, infection suspected EXAM: CT NECK WITHOUT CONTRAST TECHNIQUE: Multidetector CT imaging of the neck was performed following the standard protocol without intravenous contrast. RADIATION DOSE REDUCTION: This exam was performed according to the departmental dose-optimization program which includes automated exposure control, adjustment of the mA and/or kV according to patient size and/or use of iterative reconstruction technique. COMPARISON:  None Available. FINDINGS: Evaluation is somewhat limited by the absence of intravenous contrast and patient positioning. Pharynx and larynx: Normal. No mass or swelling. Salivary glands: No acute finding in the parotid glands. Poor visualization of the submandibular glands but no definite mass or stone is seen. Thyroid: Normal. Lymph nodes: None enlarged or abnormal density. Vascular: Vascular calcifications, including severe coronary and aortic atherosclerotic calcifications. Limited intracranial: Negative. Visualized orbits: No acute finding. Status post bilateral lens replacements. Mastoids and visualized paranasal sinuses: Clear. Skeleton: Advanced degenerative changes in the shoulders, with advanced joint space narrowing, subchondral sclerosis and subchondral cystic changes, with bony erosion in both glenohumeral joints. Fluid in the right glenohumeral joint, extending into the subacromial/subdeltoid bursa, likely with full-thickness superior rotator cuff tear. Advanced degenerative changes in the cervical spine, with vertebral body height loss at C7 and T1, which appears chronic. Pannus formation at C1-C2. Osseous fusion of T1 through T8, likely extending more inferiorly off field of view. Upper chest: Emphysema. No focal pulmonary opacity or pleural effusion.  IMPRESSION: 1. Evaluation is limited by the absence of intravenous contrast and patient positioning. Within this limitation, no acute finding in the neck. Poor visualization of the submandibular glands but no definite mass or stone is seen. If there is persistent concern, consider ultrasound or contrasted neck CT. 2. Advanced degenerative changes in the shoulders, with bony erosion in both glenohumeral joints and a right glenohumeral joint effusion with subacromial/subdeltoid bursal fluid. CT cannot determine whether this fluid  is inflammatory or infected. 3. Advanced degenerative changes in the cervical spine, with vertebral body height loss at C7 and T1, which appears chronic. 4. Severe coronary and aortic atherosclerotic calcifications. Aortic Atherosclerosis (ICD10-I70.0) and Emphysema (ICD10-J43.9). Electronically Signed   By: Wiliam Ke M.D.   On: 04/07/2023 14:12   US Abdomen Limited RUQ (LIVER/GB) Result Date: 04/07/2023 CLINICAL DATA:  Cholelithiasis EXAM: ULTRASOUND ABDOMEN LIMITED RIGHT UPPER QUADRANT COMPARISON:  Renal stone CT 04/06/2023. FINDINGS: Gallbladder: In the expected location of the gallbladder is a echogenic rounded shadowing area measuring 13 mm. This has is difficult to define as a true gallbladder structure. This has seen on previous exams as well. On remote studies the gallbladder was felt to be contracted with multiple stones. Appearance is similar going back to a CT of March 2022 Common bile duct: Diameter: 7 mm Liver: No focal lesion identified. Within normal limits in parenchymal echogenicity. Portal vein is patent on color Doppler imaging with normal direction of blood flow towards the liver. Other: None. IMPRESSION: No biliary ductal dilatation. There is a echogenic shadowing area in the hilum of the liver corresponding to the areas of calcification by CT. Previously this was felt to be a contracted gallbladder with stones. That is possible but the structure is not as well  defined by this ultrasound. Additional evaluation as clinically appropriate Electronically Signed   By: Karen Kays M.D.   On: 04/07/2023 10:34   CT Renal Stone Study Result Date: 04/07/2023 CLINICAL DATA:  Right flank pain with hematuria, severe weight loss and difficulty eating. EXAM: CT ABDOMEN AND PELVIS WITHOUT CONTRAST TECHNIQUE: Multidetector CT imaging of the abdomen and pelvis was performed following the standard protocol without IV contrast. RADIATION DOSE REDUCTION: This exam was performed according to the departmental dose-optimization program which includes automated exposure control, adjustment of the mA and/or kV according to patient size and/or use of iterative reconstruction technique. COMPARISON:  July 17, 2020 FINDINGS: Lower chest: There is mild cardiomegaly with a predominantly stable moderate sized pericardial effusion. Hepatobiliary: No focal liver abnormality is seen. Multiple gallstones are seen within a contracted gallbladder, without evidence of gallbladder wall thickening, pericholecystic inflammation or biliary dilatation. Pancreas: Unremarkable. No pancreatic ductal dilatation or surrounding inflammatory changes. Spleen: A stable 2.8 cm diameter partially calcified cyst is seen within the posterior aspect of the spleen. Adrenals/Urinary Tract: Adrenal glands are unremarkable. The kidneys are mildly atrophic in size, without renal calculi, focal lesion, or hydronephrosis. A mild amount of air is seen within the lumen of the urinary bladder. Stomach/Bowel: The stomach is poorly distended. Moderate to marked severity diffuse gastric wall thickening is noted. The appendix is surgically absent. No evidence of bowel dilatation. Numerous diverticula are seen scattered throughout the large bowel. Vascular/Lymphatic: Aortic atherosclerosis. No enlarged abdominal or pelvic lymph nodes. Reproductive: Status post hysterectomy. No adnexal masses. Other: No abdominal wall hernia or abnormality.  No abdominopelvic ascites. Musculoskeletal: Diffusely sclerotic vertebral bodies are seen with marked severity multilevel degenerative changes noted throughout the thoracolumbar spine. IMPRESSION: 1. Cholelithiasis. 2. Colonic diverticulosis. 3. Moderate to marked severity diffuse gastric wall thickening which may represent gastritis. Correlation with follow-up upper GI series or endoscopy is recommended. 4. Mild amount of air within the lumen of the urinary bladder which may be secondary to recent instrumentation. 5. Mild cardiomegaly with a predominantly stable moderate sized pericardial effusion. 6. Aortic atherosclerosis. Aortic Atherosclerosis (ICD10-I70.0). Electronically Signed   By: Aram Candela M.D.   On: 04/07/2023 00:10   DG Chest Portable  1 View Result Date: 04/06/2023 CLINICAL DATA:  Hypoxia EXAM: PORTABLE CHEST 1 VIEW COMPARISON:  03/09/2022 FINDINGS: The patient's head and chin obscure the apices. Chronic elevation of the right diaphragm. Cardiomegaly. Aortic atherosclerosis. Possible patchy right mid lung infiltrates. No pneumothorax. Severe shoulder degenerative change IMPRESSION: Possible patchy right mid lung infiltrates. Chronic elevation of right diaphragm. Cardiomegaly. Electronically Signed   By: Jasmine Pang M.D.   On: 04/06/2023 23:40     Subjective: Patient seen examined bedside, resting calmly.  Working with physical therapy.  No specific complaints this morning.  Discharging to rehab.  Denies headache, no dizziness, no chest pain, no palpitations, no shortness of breath, no abdominal pain, no fever/chills/night sweats, no nausea/vomiting/diarrhea, no focal weakness, no fatigue, no paresthesia.  No acute events overnight per nurse staff.  Discharge Exam: Vitals:   04/17/23 2206 04/18/23 0747  BP: (!) 121/51 (!) 128/43  Pulse: 75 64  Resp: 18 15  Temp: 98.1 F (36.7 C) 98.6 F (37 C)  SpO2: 100% 100%   Vitals:   04/17/23 1739 04/17/23 1746 04/17/23 2206 04/18/23  0747  BP: (!) 175/49 (!) 142/51 (!) 121/51 (!) 128/43  Pulse: 60  75 64  Resp: 15  18 15   Temp: 97.6 F (36.4 C)  98.1 F (36.7 C) 98.6 F (37 C)  TempSrc: Oral   Oral  SpO2: 100%  100% 100%  Weight: 39 kg     Height:        Physical Exam: GEN: NAD, alert and oriented x 3, elderly/chronically ill appearance HEENT: NCAT, PERRL, EOMI, sclera clear, MMM PULM: Breath sounds slight diminished bilateral bases, no wheezes/crackles, normal respiratory effort without accessory muscle use, on 2 L nasal cannula with SpO2 99% at rest CV: RRR w/o M/G/R GI: abd soft, NTND, NABS, no R/G/M MSK: no peripheral edema, moves all extremities independently NEURO: No focal neurological deficits PSYCH: normal mood/affect Integumentary: No concerning rashes/lesions/wounds noted on exposed skin surfaces    The results of significant diagnostics from this hospitalization (including imaging, microbiology, ancillary and laboratory) are listed below for reference.     Microbiology: Recent Results (from the past 240 hours)  Expectorated Sputum Assessment w Gram Stain, Rflx to Resp Cult     Status: None   Collection Time: 04/09/23 12:24 PM   Specimen: Sputum  Result Value Ref Range Status   Specimen Description SPUTUM  Final   Special Requests NONE  Final   Sputum evaluation   Final    Sputum specimen not acceptable for testing.  Please recollect.   C/JOAN WILLIS AT 1329 04/09/23.PMF Performed at [facility], [address] [place], [place] [address]    Report Status 04/09/2023 FINAL  Final  Culture, blood (Routine X 2) w Reflex to ID Panel     Status: None   Collection Time: 04/10/23  6:10 PM   Specimen: BLOOD  Result Value Ref Range Status   Specimen Description BLOOD RIGHT ANTECUBITAL  Final   Special Requests   Final    BOTTLES DRAWN AEROBIC AND ANAEROBIC Blood Culture adequate volume   Culture   Final    NO GROWTH 5 DAYS Performed at [facility], [address],  [place], [place] [address]    Report Status 04/15/2023 FINAL  Final  Culture, blood (Routine X 2) w Reflex to ID Panel     Status: None   Collection Time: 04/10/23  6:10 PM   Specimen: BLOOD RIGHT HAND  Result Value Ref Range Status  Specimen Description BLOOD RIGHT HAND  Final   Special Requests   Final    BOTTLES DRAWN AEROBIC AND ANAEROBIC Blood Culture adequate volume   Culture   Final    NO GROWTH 5 DAYS Performed at [facility], [address] [place], [place] [address]    Report Status 04/15/2023 FINAL  Final     Labs: BNP (last 3 results) No results for input(s): "BNP" in the last 8760 hours. Basic Metabolic Panel: Recent Labs  Lab 04/12/23 0803 04/13/23 0424 04/14/23 0422 04/16/23 0446 04/16/23 1022 04/17/23 0413 04/18/23 0408  NA 140 136 135 138  --  136 134*  K 4.2 4.0 3.9 3.9  --  3.8 3.5  CL 103 98 98 100  --  101 98  CO2 29 30 31 28   --  26 28  GLUCOSE 83 70 77 88  --  95 140*  BUN 28* 13 26* 43*  --  53* 34*  CREATININE 2.55* 1.88* 2.70* 3.06*  --  3.55* 2.43*  CALCIUM 9.3 9.3 8.9 8.9  --  9.1 9.1  MG 1.9 2.2 1.9  --   --  1.9  --   PHOS 3.6  --  3.9  --  4.2 4.6  --    Liver Function Tests: Recent Labs  Lab 04/13/23 0424 04/14/23 0422 04/17/23 0413  AST 16 13*  --   ALT 10 6  --   ALKPHOS 161* 138*  --   BILITOT 0.8 0.6  --   PROT 5.8* 5.4*  --   ALBUMIN 2.7* 2.6* 2.5*   No results for input(s): "LIPASE", "AMYLASE" in the last 168 hours. No results for input(s): "AMMONIA" in the last 168 hours. CBC: Recent Labs  Lab 04/12/23 0803 04/13/23 0424 04/14/23 0422 04/16/23 1022 04/17/23 1409  WBC 7.6 6.3 5.9 6.4 6.7  NEUTROABS  --  5.0  --   --   --   HGB 11.4* 11.9* 10.6* 12.6 10.5*  HCT 37.0 37.7 33.9* 40.3 33.6*  MCV 88.9 87.7 89.0 88.0 87.3  PLT 252 230 225 222 235   Cardiac Enzymes: No results for input(s): "CKTOTAL", "CKMB", "CKMBINDEX", "TROPONINI" in the last 168 hours. BNP: Invalid input(s): "POCBNP" CBG: No results  for input(s): "GLUCAP" in the last 168 hours. D-Dimer No results for input(s): "DDIMER" in the last 72 hours. Hgb A1c No results for input(s): "HGBA1C" in the last 72 hours. Lipid Profile No results for input(s): "CHOL", "HDL", "LDLCALC", "TRIG", "CHOLHDL", "LDLDIRECT" in the last 72 hours. Thyroid function studies No results for input(s): "TSH", "T4TOTAL", "T3FREE", "THYROIDAB" in the last 72 hours.  Invalid input(s): "FREET3" Anemia work up No results for input(s): "VITAMINB12", "FOLATE", "FERRITIN", "TIBC", "IRON", "RETICCTPCT" in the last 72 hours. Urinalysis    Component Value Date/Time   COLORURINE YELLOW (A) 04/07/2023 0353   APPEARANCEUR CLOUDY (A) 04/07/2023 0353   APPEARANCEUR Clear 05/11/2013 2255   LABSPEC 1.013 04/07/2023 0353   LABSPEC 1.010 05/11/2013 2255   PHURINE 6.0 04/07/2023 0353   GLUCOSEU NEGATIVE 04/07/2023 0353   GLUCOSEU >=500 05/11/2013 2255   HGBUR MODERATE (A) 04/07/2023 0353   BILIRUBINUR NEGATIVE 04/07/2023 0353   BILIRUBINUR Negative 04/06/2023 1147   BILIRUBINUR Negative 05/11/2013 2255   KETONESUR NEGATIVE 04/07/2023 0353   PROTEINUR 100 (A) 04/07/2023 0353   UROBILINOGEN 0.2 04/06/2023 1147   NITRITE NEGATIVE 04/07/2023 0353   LEUKOCYTESUR LARGE (A) 04/07/2023 0353   LEUKOCYTESUR Negative 05/11/2013 2255   Sepsis Labs Recent Labs  Lab  04/13/23 0424 04/14/23 0422 04/16/23 1022 04/17/23 1409  WBC 6.3 5.9 6.4 6.7   Microbiology Recent Results (from the past 240 hours)  Expectorated Sputum Assessment w Gram Stain, Rflx to Resp Cult     Status: None   Collection Time: 04/09/23 12:24 PM   Specimen: Sputum  Result Value Ref Range Status   Specimen Description SPUTUM  Final   Special Requests NONE  Final   Sputum evaluation   Final    Sputum specimen not acceptable for testing.  Please recollect.   C/JOAN WILLIS AT 1329 04/09/23.PMF Performed at [facility], [address] [place], [place] [address]    Report Status  04/09/2023 FINAL  Final  Culture, blood (Routine X 2) w Reflex to ID Panel     Status: None   Collection Time: 04/10/23  6:10 PM   Specimen: BLOOD  Result Value Ref Range Status   Specimen Description BLOOD RIGHT ANTECUBITAL  Final   Special Requests   Final    BOTTLES DRAWN AEROBIC AND ANAEROBIC Blood Culture adequate volume   Culture   Final    NO GROWTH 5 DAYS Performed at [facility], [address], [place], [place] [address]    Report Status 04/15/2023 FINAL  Final  Culture, blood (Routine X 2) w Reflex to ID Panel     Status: None   Collection Time: 04/10/23  6:10 PM   Specimen: BLOOD RIGHT HAND  Result Value Ref Range Status   Specimen Description BLOOD RIGHT HAND  Final   Special Requests   Final    BOTTLES DRAWN AEROBIC AND ANAEROBIC Blood Culture adequate volume   Culture   Final    NO GROWTH 5 DAYS Performed at [facility], [address], [place], [place] [address]    Report Status 04/15/2023 FINAL  Final     Time coordinating discharge: Over 30 minutes  SIGNED:   Alvira Philips [place], DO  [facility] 04/18/2023, 1:08 PM

## 2023-04-18 NOTE — Progress Notes (Signed)
Physical Therapy Treatment Patient Details Name: Lori Stanley MRN: [id number] DOB: [birth year] Today's Date: 04/18/2023   History of Present Illness 89 y.o. female with PMHx of HFpEF, ESRD on hemodialysis Monday Wednesday Friday, GERD, hypertension presenting with failure to thrive, pneumonia, gastritis, Cholelithiasis and Colonic diverticulosis and UTI.    PT Comments  Pt demonstrates supervision level for bed mobility, Min A with tranfers (cues for weigh shifting forward to maintain balance), and CGA with gait using RW (15 ft due to pt requesting shorter walk to continue with breakfast).  Continued strength, balance, and activity tolerance training will assist pt towards greater safety and independence with functional mobility.   If plan is discharge home, recommend the following: Assistance with cooking/housework;Direct supervision/assist for financial management;Assist for transportation;Direct supervision/assist for medications management;Help with stairs or ramp for entrance;A little help with walking and/or transfers;A little help with bathing/dressing/bathroom   Can travel by private vehicle     Yes  Equipment Recommendations  Wheelchair (measurements PT);Wheelchair cushion (measurements PT);Rolling walker (2 wheels);BSC/3in1    Recommendations for Other Services       Precautions / Restrictions Precautions Precautions: Fall Precaution Comments: L UE fistula Restrictions Weight Bearing Restrictions Per Provider Order: No     Mobility  Bed Mobility Overal bed mobility: Needs Assistance Bed Mobility: Supine to Sit     Supine to sit: Supervision, HOB elevated          Transfers Overall transfer level: Needs assistance Equipment used: Rolling walker (2 wheels) Transfers: Sit to/from Stand, Bed to chair/wheelchair/BSC Sit to Stand: Min assist   Step pivot transfers: Min assist       General transfer comment: Min A for STS from EOB and in room mobility d/t  posterior lean at times    Ambulation/Gait Ambulation/Gait assistance: Contact guard assist Gait Distance (Feet): 15 Feet Assistive device: Rolling walker (2 wheels) Gait Pattern/deviations: Step-through pattern, Trunk flexed, Decreased step length - right, Decreased step length - left Gait velocity: decreased     General Gait Details: no unsteadiness but slow.   Stairs             Wheelchair Mobility     Tilt Bed    Modified Rankin (Stroke Patients Only)       Balance Overall balance assessment: Needs assistance Sitting-balance support: No upper extremity supported, Feet supported Sitting balance-Leahy Scale: Good Sitting balance - Comments: steady reaching within BOS Postural control: Posterior lean Standing balance support: Bilateral upper extremity supported Standing balance-Leahy Scale: Poor Standing balance comment: intermittent assist to steady with ambulation d//t posterior lean                            Cognition Arousal: Alert Behavior During Therapy: WFL for tasks assessed/performed Overall Cognitive Status: History of cognitive impairments - at baseline                                          Exercises      General Comments        Pertinent Vitals/Pain Pain Assessment Pain Assessment: No/denies pain    Home Living                          Prior Function            PT Goals (current goals can  now be found in the care plan section) Acute Rehab PT Goals Patient Stated Goal: to go home PT Goal Formulation: With patient Time For Goal Achievement: 04/22/23 Potential to Achieve Goals: Good Progress towards PT goals: Progressing toward goals    Frequency    Min 1X/week      PT Plan      Co-evaluation              AM-PAC PT "6 Clicks" Mobility   Outcome Measure  Help needed turning from your back to your side while in a flat bed without using bedrails?: A Little Help needed  moving from lying on your back to sitting on the side of a flat bed without using bedrails?: A Little Help needed moving to and from a bed to a chair (including a wheelchair)?: A Little Help needed standing up from a chair using your arms (e.g., wheelchair or bedside chair)?: A Little Help needed to walk in hospital room?: A Little Help needed climbing 3-5 steps with a railing? : A Lot 6 Click Score: 17    End of Session Equipment Utilized During Treatment: Gait belt;Oxygen Activity Tolerance: Patient tolerated treatment well Patient left: in chair;with call bell/phone within reach;with chair alarm set Nurse Communication: Mobility status;Precautions PT Visit Diagnosis: Unsteadiness on feet (R26.81);Muscle weakness (generalized) (M62.81)     Time: 0865-7846 PT Time Calculation (min) (ACUTE ONLY): 30 min  Charges:    $Therapeutic Activity: 23-37 mins                       Hortencia Conradi, PTA  04/18/23, 9:56 AM

## 2023-04-18 NOTE — Plan of Care (Signed)

## 2023-04-19 DIAGNOSIS — K219 Gastro-esophageal reflux disease without esophagitis: Secondary | ICD-10-CM | POA: Diagnosis not present

## 2023-04-19 DIAGNOSIS — N2581 Secondary hyperparathyroidism of renal origin: Secondary | ICD-10-CM | POA: Diagnosis not present

## 2023-04-19 DIAGNOSIS — Z992 Dependence on renal dialysis: Secondary | ICD-10-CM | POA: Diagnosis not present

## 2023-04-19 DIAGNOSIS — N186 End stage renal disease: Secondary | ICD-10-CM | POA: Diagnosis not present

## 2023-04-20 DIAGNOSIS — N186 End stage renal disease: Secondary | ICD-10-CM | POA: Diagnosis not present

## 2023-04-20 DIAGNOSIS — A409 Streptococcal sepsis, unspecified: Secondary | ICD-10-CM | POA: Diagnosis not present

## 2023-04-20 DIAGNOSIS — I10 Essential (primary) hypertension: Secondary | ICD-10-CM | POA: Diagnosis not present

## 2023-04-20 NOTE — Consult Note (Signed)
[facility] Liaison Note  04/20/2023  Lori Stanley Aug 26, 1931 [id number]  Location: RN Hospital Liaison screened the patient remotely at [facility].  Insurance: [employer]   Lori Stanley is a 89 y.o. female who is a Primary Care Patient of Simmons-Robinson, Financial controller, MD-[facility]. The patient was screened for  readmission hospitalization with noted high risk score for unplanned readmission risk with 1 IP in 6 months.  The patient was assessed for potential Care Management service needs for post hospital transition for care coordination. Review of patient's electronic medical record reveals patient was admitted for Streptococcal Bacteremia. Pt discharged to SNF facility will continue to address pt's ongoing needs. Liaison will alert PAC-RN concerning pt's recent discharge to a SNF level of care.   VBCI Care Management/Population Health does not replace or interfere with any arrangements made by the Inpatient Transition of Care team.   For questions contact:   Elliot Cousin, RN, [facility] Liaison [place]   [facility], Population Health Office Hours MTWF  8:00 am-6:00 pm Direct Dial: [phone] mobile [phone] [Office toll free line] Office Hours are M-F 8:30 - 5 pm [email or web address]

## 2023-04-21 ENCOUNTER — Emergency Department: Payer: 59

## 2023-04-21 ENCOUNTER — Other Ambulatory Visit: Payer: Self-pay

## 2023-04-21 ENCOUNTER — Emergency Department
Admission: EM | Admit: 2023-04-21 | Discharge: 2023-04-21 | Disposition: A | Discharge status: Home / Self Care | Payer: 59 | Attending: Emergency Medicine | Admitting: Emergency Medicine

## 2023-04-21 DIAGNOSIS — E1122 Type 2 diabetes mellitus with diabetic chronic kidney disease: Secondary | ICD-10-CM | POA: Insufficient documentation

## 2023-04-21 DIAGNOSIS — I517 Cardiomegaly: Secondary | ICD-10-CM | POA: Diagnosis not present

## 2023-04-21 DIAGNOSIS — R9389 Abnormal findings on diagnostic imaging of other specified body structures: Secondary | ICD-10-CM | POA: Diagnosis not present

## 2023-04-21 DIAGNOSIS — N186 End stage renal disease: Secondary | ICD-10-CM | POA: Insufficient documentation

## 2023-04-21 DIAGNOSIS — Z992 Dependence on renal dialysis: Secondary | ICD-10-CM | POA: Diagnosis not present

## 2023-04-21 DIAGNOSIS — I5030 Unspecified diastolic (congestive) heart failure: Secondary | ICD-10-CM | POA: Insufficient documentation

## 2023-04-21 DIAGNOSIS — R0989 Other specified symptoms and signs involving the circulatory and respiratory systems: Secondary | ICD-10-CM | POA: Diagnosis not present

## 2023-04-21 DIAGNOSIS — N2581 Secondary hyperparathyroidism of renal origin: Secondary | ICD-10-CM | POA: Diagnosis not present

## 2023-04-21 DIAGNOSIS — R0602 Shortness of breath: Secondary | ICD-10-CM | POA: Diagnosis not present

## 2023-04-21 DIAGNOSIS — I132 Hypertensive heart and chronic kidney disease with heart failure and with stage 5 chronic kidney disease, or end stage renal disease: Secondary | ICD-10-CM | POA: Diagnosis not present

## 2023-04-21 LAB — COMPREHENSIVE METABOLIC PANEL
ALT: <5 U/L (ref 0–44)
AST: 17 U/L (ref 15–41)
Albumin: 2.9 g/dL — ABNORMAL LOW (ref 3.5–5.0)
Alkaline Phosphatase: 138 U/L — ABNORMAL HIGH (ref 38–126)
Anion gap: 12 (ref 5–15)
BUN: 31 mg/dL — ABNORMAL HIGH (ref 8–23)
CO2: 29 mmol/L (ref 22–32)
Calcium: 8.9 mg/dL (ref 8.9–10.3)
Chloride: 99 mmol/L (ref 98–111)
Creatinine, Ser: 2.9 mg/dL — ABNORMAL HIGH (ref 0.44–1.00)
GFR, Estimated: 15 mL/min — ABNORMAL LOW (ref >60)
Glucose, Bld: 92 mg/dL (ref 70–99)
Potassium: 3.8 mmol/L (ref 3.5–5.1)
Sodium: 140 mmol/L (ref 135–145)
Total Bilirubin: 0.5 mg/dL (ref <1.2)
Total Protein: 6.6 g/dL (ref 6.5–8.1)

## 2023-04-21 LAB — CBC
HCT: 38.2 % (ref 36.0–46.0)
Hemoglobin: 11.6 g/dL — ABNORMAL LOW (ref 12.0–15.0)
MCH: 27.8 pg (ref 26.0–34.0)
MCHC: 30.4 g/dL (ref 30.0–36.0)
MCV: 91.4 fL (ref 80.0–100.0)
Platelets: 268 10*3/uL (ref 150–400)
RBC: 4.18 MIL/uL (ref 3.87–5.11)
RDW: 14.8 % (ref 11.5–15.5)
WBC: 11.4 10*3/uL — ABNORMAL HIGH (ref 4.0–10.5)
nRBC: 0 % (ref 0.0–0.2)

## 2023-04-21 LAB — LACTIC ACID, PLASMA: Lactic Acid, Venous: 1.1 mmol/L (ref 0.5–1.9)

## 2023-04-21 NOTE — ED Notes (Signed)
Dialysis RN at bedside to de-access dialysis fistula.

## 2023-04-21 NOTE — ED Notes (Signed)
Attempted to call report x 1  

## 2023-04-21 NOTE — ED Triage Notes (Signed)
Pt here via [facility] with SOB. PT was at [facility] getting dialysis and only received 30 mins of tx. Pt arrives with dialysis tubing still attatched. Pt was recently dx with a UTI and sepsis on Tuesday. Pt has hx of CHF.   98% 2L 80s on RA

## 2023-04-21 NOTE — ED Provider Notes (Signed)
[facility] Provider Note    Event Date/Time   First MD Initiated Contact with Patient 04/21/23 1730     (approximate)   History   Shortness of Breath   HPI Lori Stanley is a 89 y.o. female with history of of HFpEF, ESRD on dialysis, HTN, DM2, HLD presenting today for shortness of breath.  EMS notes that patient was reportedly 30 minutes into dialysis when she got short of breath.  She was transferred for the ED for further evaluation.  On my interview, patient states that her blood pressure got low and she got lightheaded.  Reportedly did not pass out.  At this time, she denies chest pain, shortness of breath, fever, chills, abdominal pain, nausea, vomiting, leg swelling, leg pain.  Chart review: Patient recently just discharged from the hospital 3 days ago for an admission related to weight loss and failure to thrive with UTI.  Patient is currently being treated with IV cefazolin during her dialysis sessions for treatment.  She has chronic hypoxic respiratory failure on 2 L baseline.     Physical Exam   Triage Vital Signs: ED Triage Vitals  Encounter Vitals Group     BP 04/21/23 1255 (!) 119/42     Systolic BP Percentile --      Diastolic BP Percentile --      Pulse Rate 04/21/23 1255 79     Resp 04/21/23 1255 17     Temp 04/21/23 1255 98.3 F (36.8 C)     Temp src --      SpO2 04/21/23 1255 100 %     Weight 04/21/23 1251 85 lb 15.7 oz (39 kg)     Height 04/21/23 1251 5\' 2"  (1.575 m)     Head Circumference --      Peak Flow --      Pain Score 04/21/23 1251 0     Pain Loc --      Pain Education --      Exclude from Growth Chart --     Most recent vital signs: Vitals:   04/21/23 1800 04/21/23 1830  BP: (!) 130/97 (!) 157/72  Pulse: (!) 118 88  Resp: (!) 22 18  Temp:    SpO2: 100% 100%   Physical Exam: I have reviewed the vital signs and nursing notes. General: Awake, alert, no acute distress.  Nontoxic appearing. Head:   Atraumatic, normocephalic.   ENT:  EOM intact, PERRL. Oral mucosa is pink and moist with no lesions. Neck: Neck is supple with full range of motion, No meningeal signs. Cardiovascular:  RRR, No murmurs. Peripheral pulses palpable and equal bilaterally. Respiratory:  Symmetrical chest wall expansion.  Rhonchi.  No tachypnea.  Good air movement throughout.  No use of accessory muscles.   Musculoskeletal:  No cyanosis or edema. Moving extremities with full ROM Abdomen:  Soft, nontender, nondistended. Neuro:  GCS 15, moving all four extremities, interacting appropriately. Speech clear. Psych:  Calm, appropriate.   Skin: Fistula in left upper extremity.   ED Results / Procedures / Treatments   Labs (all labs ordered are listed, but only abnormal results are displayed) Labs Reviewed  CBC - Abnormal; Notable for the following components:      Result Value   WBC 11.4 (*)    Hemoglobin 11.6 (*)    All other components within normal limits  COMPREHENSIVE METABOLIC PANEL - Abnormal; Notable for the following components:   BUN 31 (*)    Creatinine, Ser 2.90 (*)  Albumin 2.9 (*)    Alkaline Phosphatase 138 (*)    GFR, Estimated 15 (*)    All other components within normal limits  LACTIC ACID, PLASMA  LACTIC ACID, PLASMA     EKG My EKG interpretation: Rate of 80, normal sinus rhythm, normal axis, normal intervals.  No acute ST elevations or depressions   RADIOLOGY My x-ray interpretation: Enlarged heart with mild vascular congestion but no other overt pathology   PROCEDURES:  Critical Care performed: No  Procedures   MEDICATIONS ORDERED IN ED: Medications - No data to display   IMPRESSION / MDM / ASSESSMENT AND PLAN / ED COURSE  I reviewed the triage vital signs and the nursing notes.                              Differential diagnosis includes, but is not limited to, volume overload, pneumonia, pneumothorax, electrolyte abnormality, dehydration secondary to  dialysis  Patient's presentation is most consistent with acute complicated illness / injury requiring diagnostic workup.  Patient is an 89 year old female presenting today for shortness of breath versus lightheadedness at dialysis.  On arrival here, patient came in on 4 L nasal cannula.  She was not exhibiting any shortness of breath and denied any symptoms at this time.  Successfully titrated her back down to her normal 2 L and satting upper 90s without issue.  No tachypnea noted.  Vital signs otherwise completely stable without tachycardia or hypotension.  Laboratory workup at this time all reassuring and consistent with patient's baseline.  Given no electrolyte abnormalities and no hypoxia at baseline on her 2 L, do not think she needs urgent dialysis at this time.  Do think patient safe for discharge and follow-up with her nephrologist as planned.  No obvious concerns requiring admission at this time.  Was given strict return precautions.  Please see ED course for discussion with niece who is patient's primary contact.  The patient is on the cardiac monitor to evaluate for evidence of arrhythmia and/or significant heart rate changes. Clinical Course as of 04/21/23 1918  Fri Apr 21, 2023  1907 Spoke with Glendale Chard (niece) -stated that she was told patient had some shortness of breath at dialysis but was otherwise doing well.  Explained reassuring laboratory workup and chest x-ray here today.  Vital signs are good and patient has no complaints.  Niece is reassured by this and is happy with patient to be discharged and explained any strict return precautions. [DW]    Clinical Course User Index [DW] Janith [place], MD     FINAL CLINICAL IMPRESSION(S) / ED DIAGNOSES   Final diagnoses:  SOB (shortness of breath)     Rx / DC Orders   ED Discharge Orders     None        Note:  This document was prepared using Dragon voice recognition software and may include unintentional dictation  errors.   Janith [place], MD 04/21/23 [phone]

## 2023-04-21 NOTE — ED Provider Triage Note (Signed)
Emergency Medicine Provider Triage Evaluation Note  Lori Stanley , a 89 y.o. female  was evaluated in triage.  Pt complains of shortness of breath, patient was at [facility] and got about 30 minutes of her dialysis when she became short of breath.  They sent her to the ED.  Patient was septic on Tuesday of this week from UTI.[name]  Review of Systems  Positive:  Negative:   Physical Exam  There were no vitals taken for this visit. Gen:   Awake, no distress   Resp:  Normal effort  MSK:   Moves extremities without difficulty  Other:    Medical Decision Making  Medically screening exam initiated at 12:50 PM.  Appropriate orders placed.  Lori Stanley was informed that the remainder of the evaluation will be completed by another provider, this initial triage assessment does not replace that evaluation, and the importance of remaining in the ED until their evaluation is complete.  Nursing staff instructed to do sepsis protocols due to recent sepsis   Faythe Ghee, PA-C 04/21/23 1251

## 2023-04-21 NOTE — Discharge Instructions (Signed)
Please follow-up with your primary care provider as needed.  Please return to the emergency department with any significant worsening shortness of breath or other acute concerns.

## 2023-04-21 NOTE — Progress Notes (Signed)
Removed both venous and arterial needles. Pressure was held for 10 mins both site. No bleeding, dressings are dry and intact.

## 2023-04-22 DIAGNOSIS — I10 Essential (primary) hypertension: Secondary | ICD-10-CM | POA: Diagnosis not present

## 2023-04-22 DIAGNOSIS — A409 Streptococcal sepsis, unspecified: Secondary | ICD-10-CM | POA: Diagnosis not present

## 2023-04-22 DIAGNOSIS — N186 End stage renal disease: Secondary | ICD-10-CM | POA: Diagnosis not present

## 2023-04-22 DIAGNOSIS — J9620 Acute and chronic respiratory failure, unspecified whether with hypoxia or hypercapnia: Secondary | ICD-10-CM | POA: Diagnosis not present

## 2023-04-24 DIAGNOSIS — Z992 Dependence on renal dialysis: Secondary | ICD-10-CM | POA: Diagnosis not present

## 2023-04-24 DIAGNOSIS — N2581 Secondary hyperparathyroidism of renal origin: Secondary | ICD-10-CM | POA: Diagnosis not present

## 2023-04-24 DIAGNOSIS — N186 End stage renal disease: Secondary | ICD-10-CM | POA: Diagnosis not present

## 2023-04-25 DIAGNOSIS — J9611 Chronic respiratory failure with hypoxia: Secondary | ICD-10-CM | POA: Diagnosis not present

## 2023-04-25 DIAGNOSIS — R7881 Bacteremia: Secondary | ICD-10-CM | POA: Diagnosis not present

## 2023-04-25 DIAGNOSIS — I5032 Chronic diastolic (congestive) heart failure: Secondary | ICD-10-CM | POA: Diagnosis not present

## 2023-04-25 DIAGNOSIS — N390 Urinary tract infection, site not specified: Secondary | ICD-10-CM | POA: Diagnosis not present

## 2023-04-27 ENCOUNTER — Inpatient Hospital Stay: Payer: 59 | Admitting: Infectious Diseases

## 2023-04-27 DIAGNOSIS — N186 End stage renal disease: Secondary | ICD-10-CM | POA: Diagnosis not present

## 2023-04-27 DIAGNOSIS — Z992 Dependence on renal dialysis: Secondary | ICD-10-CM | POA: Diagnosis not present

## 2023-04-27 DIAGNOSIS — N2581 Secondary hyperparathyroidism of renal origin: Secondary | ICD-10-CM | POA: Diagnosis not present

## 2023-04-28 ENCOUNTER — Other Ambulatory Visit: Payer: Self-pay

## 2023-04-28 ENCOUNTER — Inpatient Hospital Stay
Admission: EM | Admit: 2023-04-28 | Discharge: 2023-05-02 | DRG: 640 | Disposition: A | Discharge status: Skilled Nursing Facility | Payer: 59 | Source: Skilled Nursing Facility | Attending: Internal Medicine | Admitting: Internal Medicine

## 2023-04-28 ENCOUNTER — Emergency Department: Payer: 59

## 2023-04-28 ENCOUNTER — Encounter: Payer: Self-pay | Admitting: Emergency Medicine

## 2023-04-28 DIAGNOSIS — R54 Age-related physical debility: Secondary | ICD-10-CM | POA: Diagnosis present

## 2023-04-28 DIAGNOSIS — I509 Heart failure, unspecified: Secondary | ICD-10-CM | POA: Diagnosis not present

## 2023-04-28 DIAGNOSIS — F32A Depression, unspecified: Secondary | ICD-10-CM | POA: Diagnosis present

## 2023-04-28 DIAGNOSIS — K219 Gastro-esophageal reflux disease without esophagitis: Secondary | ICD-10-CM | POA: Diagnosis present

## 2023-04-28 DIAGNOSIS — J9620 Acute and chronic respiratory failure, unspecified whether with hypoxia or hypercapnia: Secondary | ICD-10-CM | POA: Diagnosis not present

## 2023-04-28 DIAGNOSIS — B955 Unspecified streptococcus as the cause of diseases classified elsewhere: Secondary | ICD-10-CM | POA: Diagnosis present

## 2023-04-28 DIAGNOSIS — J9621 Acute and chronic respiratory failure with hypoxia: Secondary | ICD-10-CM | POA: Diagnosis not present

## 2023-04-28 DIAGNOSIS — I5032 Chronic diastolic (congestive) heart failure: Secondary | ICD-10-CM | POA: Diagnosis present

## 2023-04-28 DIAGNOSIS — E43 Unspecified severe protein-calorie malnutrition: Secondary | ICD-10-CM | POA: Diagnosis not present

## 2023-04-28 DIAGNOSIS — R413 Other amnesia: Secondary | ICD-10-CM | POA: Diagnosis present

## 2023-04-28 DIAGNOSIS — Z681 Body mass index (BMI) 19 or less, adult: Secondary | ICD-10-CM

## 2023-04-28 DIAGNOSIS — I10 Essential (primary) hypertension: Secondary | ICD-10-CM | POA: Diagnosis not present

## 2023-04-28 DIAGNOSIS — Z743 Need for continuous supervision: Secondary | ICD-10-CM | POA: Diagnosis not present

## 2023-04-28 DIAGNOSIS — E785 Hyperlipidemia, unspecified: Secondary | ICD-10-CM | POA: Diagnosis present

## 2023-04-28 DIAGNOSIS — E669 Obesity, unspecified: Secondary | ICD-10-CM | POA: Diagnosis present

## 2023-04-28 DIAGNOSIS — R6889 Other general symptoms and signs: Secondary | ICD-10-CM | POA: Diagnosis not present

## 2023-04-28 DIAGNOSIS — Z87891 Personal history of nicotine dependence: Secondary | ICD-10-CM

## 2023-04-28 DIAGNOSIS — G4700 Insomnia, unspecified: Secondary | ICD-10-CM | POA: Diagnosis not present

## 2023-04-28 DIAGNOSIS — N2581 Secondary hyperparathyroidism of renal origin: Secondary | ICD-10-CM | POA: Diagnosis present

## 2023-04-28 DIAGNOSIS — E11649 Type 2 diabetes mellitus with hypoglycemia without coma: Secondary | ICD-10-CM | POA: Diagnosis present

## 2023-04-28 DIAGNOSIS — Z992 Dependence on renal dialysis: Secondary | ICD-10-CM | POA: Diagnosis not present

## 2023-04-28 DIAGNOSIS — E569 Vitamin deficiency, unspecified: Secondary | ICD-10-CM | POA: Diagnosis not present

## 2023-04-28 DIAGNOSIS — Z8249 Family history of ischemic heart disease and other diseases of the circulatory system: Secondary | ICD-10-CM | POA: Diagnosis not present

## 2023-04-28 DIAGNOSIS — E119 Type 2 diabetes mellitus without complications: Secondary | ICD-10-CM | POA: Diagnosis not present

## 2023-04-28 DIAGNOSIS — Z79899 Other long term (current) drug therapy: Secondary | ICD-10-CM | POA: Diagnosis not present

## 2023-04-28 DIAGNOSIS — B954 Other streptococcus as the cause of diseases classified elsewhere: Secondary | ICD-10-CM | POA: Diagnosis not present

## 2023-04-28 DIAGNOSIS — R1312 Dysphagia, oropharyngeal phase: Secondary | ICD-10-CM | POA: Diagnosis not present

## 2023-04-28 DIAGNOSIS — D631 Anemia in chronic kidney disease: Secondary | ICD-10-CM | POA: Diagnosis not present

## 2023-04-28 DIAGNOSIS — R7881 Bacteremia: Secondary | ICD-10-CM | POA: Diagnosis present

## 2023-04-28 DIAGNOSIS — R9389 Abnormal findings on diagnostic imaging of other specified body structures: Secondary | ICD-10-CM | POA: Diagnosis not present

## 2023-04-28 DIAGNOSIS — E8770 Fluid overload, unspecified: Principal | ICD-10-CM | POA: Diagnosis present

## 2023-04-28 DIAGNOSIS — I120 Hypertensive chronic kidney disease with stage 5 chronic kidney disease or end stage renal disease: Secondary | ICD-10-CM | POA: Diagnosis not present

## 2023-04-28 DIAGNOSIS — A419 Sepsis, unspecified organism: Secondary | ICD-10-CM | POA: Diagnosis not present

## 2023-04-28 DIAGNOSIS — R0689 Other abnormalities of breathing: Secondary | ICD-10-CM | POA: Diagnosis not present

## 2023-04-28 DIAGNOSIS — J9601 Acute respiratory failure with hypoxia: Secondary | ICD-10-CM | POA: Diagnosis not present

## 2023-04-28 DIAGNOSIS — I132 Hypertensive heart and chronic kidney disease with heart failure and with stage 5 chronic kidney disease, or end stage renal disease: Secondary | ICD-10-CM | POA: Diagnosis not present

## 2023-04-28 DIAGNOSIS — F419 Anxiety disorder, unspecified: Secondary | ICD-10-CM | POA: Diagnosis not present

## 2023-04-28 DIAGNOSIS — N186 End stage renal disease: Secondary | ICD-10-CM | POA: Diagnosis not present

## 2023-04-28 DIAGNOSIS — E16A1 Hypoglycemia level 1: Secondary | ICD-10-CM | POA: Diagnosis present

## 2023-04-28 DIAGNOSIS — R2681 Unsteadiness on feet: Secondary | ICD-10-CM | POA: Diagnosis not present

## 2023-04-28 DIAGNOSIS — I252 Old myocardial infarction: Secondary | ICD-10-CM

## 2023-04-28 DIAGNOSIS — E1122 Type 2 diabetes mellitus with diabetic chronic kidney disease: Secondary | ICD-10-CM | POA: Diagnosis present

## 2023-04-28 DIAGNOSIS — Z90710 Acquired absence of both cervix and uterus: Secondary | ICD-10-CM

## 2023-04-28 DIAGNOSIS — R55 Syncope and collapse: Secondary | ICD-10-CM | POA: Diagnosis not present

## 2023-04-28 DIAGNOSIS — N189 Chronic kidney disease, unspecified: Secondary | ICD-10-CM | POA: Diagnosis present

## 2023-04-28 DIAGNOSIS — R634 Abnormal weight loss: Secondary | ICD-10-CM

## 2023-04-28 DIAGNOSIS — Z741 Need for assistance with personal care: Secondary | ICD-10-CM | POA: Diagnosis not present

## 2023-04-28 DIAGNOSIS — R0989 Other specified symptoms and signs involving the circulatory and respiratory systems: Secondary | ICD-10-CM | POA: Diagnosis not present

## 2023-04-28 DIAGNOSIS — I2510 Atherosclerotic heart disease of native coronary artery without angina pectoris: Secondary | ICD-10-CM | POA: Diagnosis not present

## 2023-04-28 DIAGNOSIS — R5383 Other fatigue: Secondary | ICD-10-CM | POA: Diagnosis not present

## 2023-04-28 DIAGNOSIS — R0602 Shortness of breath: Secondary | ICD-10-CM | POA: Diagnosis not present

## 2023-04-28 DIAGNOSIS — R1310 Dysphagia, unspecified: Secondary | ICD-10-CM

## 2023-04-28 DIAGNOSIS — J439 Emphysema, unspecified: Secondary | ICD-10-CM | POA: Diagnosis not present

## 2023-04-28 DIAGNOSIS — I517 Cardiomegaly: Secondary | ICD-10-CM | POA: Diagnosis not present

## 2023-04-28 DIAGNOSIS — Z1152 Encounter for screening for COVID-19: Secondary | ICD-10-CM | POA: Diagnosis not present

## 2023-04-28 DIAGNOSIS — Z515 Encounter for palliative care: Secondary | ICD-10-CM | POA: Diagnosis not present

## 2023-04-28 DIAGNOSIS — I499 Cardiac arrhythmia, unspecified: Secondary | ICD-10-CM | POA: Diagnosis not present

## 2023-04-28 DIAGNOSIS — H409 Unspecified glaucoma: Secondary | ICD-10-CM | POA: Diagnosis not present

## 2023-04-28 DIAGNOSIS — I700 Atherosclerosis of aorta: Secondary | ICD-10-CM | POA: Diagnosis not present

## 2023-04-28 DIAGNOSIS — M6259 Muscle wasting and atrophy, not elsewhere classified, multiple sites: Secondary | ICD-10-CM | POA: Diagnosis not present

## 2023-04-28 DIAGNOSIS — Z7189 Other specified counseling: Secondary | ICD-10-CM | POA: Diagnosis not present

## 2023-04-28 DIAGNOSIS — I5022 Chronic systolic (congestive) heart failure: Secondary | ICD-10-CM | POA: Diagnosis not present

## 2023-04-28 LAB — TROPONIN I (HIGH SENSITIVITY)
Troponin I (High Sensitivity): 58 ng/L — ABNORMAL HIGH (ref <18)
Troponin I (High Sensitivity): 53 ng/L — ABNORMAL HIGH (ref <18)
Troponin I (High Sensitivity): 62 ng/L — ABNORMAL HIGH (ref <18)

## 2023-04-28 LAB — COMPREHENSIVE METABOLIC PANEL
ALT: <5 U/L (ref 0–44)
AST: 17 U/L (ref 15–41)
Albumin: 2.6 g/dL — ABNORMAL LOW (ref 3.5–5.0)
Alkaline Phosphatase: 99 U/L (ref 38–126)
Anion gap: 13 (ref 5–15)
BUN: 26 mg/dL — ABNORMAL HIGH (ref 8–23)
CO2: 25 mmol/L (ref 22–32)
Calcium: 8.4 mg/dL — ABNORMAL LOW (ref 8.9–10.3)
Chloride: 100 mmol/L (ref 98–111)
Creatinine, Ser: 3.9 mg/dL — ABNORMAL HIGH (ref 0.44–1.00)
GFR, Estimated: 11 mL/min — ABNORMAL LOW (ref >60)
Glucose, Bld: 85 mg/dL (ref 70–99)
Potassium: 3.3 mmol/L — ABNORMAL LOW (ref 3.5–5.1)
Sodium: 138 mmol/L (ref 135–145)
Total Bilirubin: 0.5 mg/dL (ref <1.2)
Total Protein: 5.8 g/dL — ABNORMAL LOW (ref 6.5–8.1)

## 2023-04-28 LAB — CBC WITH DIFFERENTIAL/PLATELET
Abs Immature Granulocytes: 0.02 10*3/uL (ref 0.00–0.07)
Basophils Absolute: 0 10*3/uL (ref 0.0–0.1)
Basophils Relative: 0 %
Eosinophils Absolute: 0 10*3/uL (ref 0.0–0.5)
Eosinophils Relative: 1 %
HCT: 38.6 % (ref 36.0–46.0)
Hemoglobin: 11.5 g/dL — ABNORMAL LOW (ref 12.0–15.0)
Immature Granulocytes: 0 %
Lymphocytes Relative: 10 %
Lymphs Abs: 0.7 10*3/uL (ref 0.7–4.0)
MCH: 28 pg (ref 26.0–34.0)
MCHC: 29.8 g/dL — ABNORMAL LOW (ref 30.0–36.0)
MCV: 93.9 fL (ref 80.0–100.0)
Monocytes Absolute: 0.5 10*3/uL (ref 0.1–1.0)
Monocytes Relative: 7 %
Neutro Abs: 6.2 10*3/uL (ref 1.7–7.7)
Neutrophils Relative %: 82 %
Platelets: 275 10*3/uL (ref 150–400)
RBC: 4.11 MIL/uL (ref 3.87–5.11)
RDW: 14.3 % (ref 11.5–15.5)
WBC: 7.5 10*3/uL (ref 4.0–10.5)
nRBC: 0 % (ref 0.0–0.2)

## 2023-04-28 LAB — BRAIN NATRIURETIC PEPTIDE: B Natriuretic Peptide: 224.4 pg/mL — ABNORMAL HIGH (ref 0.0–100.0)

## 2023-04-28 LAB — RESP PANEL BY RT-PCR (RSV, FLU A&B, COVID)  RVPGX2
Influenza A by PCR: NEGATIVE
Influenza B by PCR: NEGATIVE
Resp Syncytial Virus by PCR: NEGATIVE
SARS Coronavirus 2 by RT PCR: NEGATIVE

## 2023-04-28 LAB — GLUCOSE, CAPILLARY
Glucose-Capillary: 62 mg/dL — ABNORMAL LOW (ref 70–99)
Glucose-Capillary: 69 mg/dL — ABNORMAL LOW (ref 70–99)
Glucose-Capillary: 66 mg/dL — ABNORMAL LOW (ref 70–99)
Glucose-Capillary: 69 mg/dL — ABNORMAL LOW (ref 70–99)

## 2023-04-28 LAB — PROCALCITONIN: Procalcitonin: 0.58 ng/mL

## 2023-04-28 LAB — CBG MONITORING, ED
Glucose-Capillary: 60 mg/dL — ABNORMAL LOW (ref 70–99)
Glucose-Capillary: 108 mg/dL — ABNORMAL HIGH (ref 70–99)

## 2023-04-28 MED ORDER — HEPARIN SODIUM (PORCINE) 5000 UNIT/ML IJ SOLN
5000.0000 [IU] | Freq: Three times a day (TID) | INTRAMUSCULAR | Status: DC
  Administered 2023-04-28 – 2023-05-02 (×10): 5000 [IU] via SUBCUTANEOUS
  Filled 2023-04-28 (×10): qty 1

## 2023-04-28 MED ORDER — INSULIN ASPART 100 UNIT/ML IJ SOLN: 0.0000 [IU] | Freq: Every day | INTRAMUSCULAR | Status: DC

## 2023-04-28 MED ORDER — CEFAZOLIN IV (FOR PTA / DISCHARGE USE ONLY): 2.0000 g | INTRAVENOUS | Status: DC

## 2023-04-28 MED ORDER — ONDANSETRON HCL 4 MG/2ML IJ SOLN: 4.0000 mg | Freq: Four times a day (QID) | INTRAMUSCULAR | Status: DC | PRN

## 2023-04-28 MED ORDER — DEXTROSE 50 % IV SOLN
25.0000 mL | INTRAVENOUS | Status: DC | PRN
  Administered 2023-04-28 – 2023-04-29 (×3): 25 mL via INTRAVENOUS
  Filled 2023-04-28 (×3): qty 50

## 2023-04-28 MED ORDER — PANTOPRAZOLE SODIUM 40 MG PO TBEC
80.0000 mg | DELAYED_RELEASE_TABLET | Freq: Every day | ORAL | Status: DC
  Administered 2023-04-30 – 2023-05-02 (×3): 80 mg via ORAL
  Filled 2023-04-28 (×3): qty 2

## 2023-04-28 MED ORDER — CEFAZOLIN SODIUM-DEXTROSE 2-4 GM/100ML-% IV SOLN
2.0000 g | INTRAVENOUS | Status: DC | PRN
  Administered 2023-04-29: 2 g via INTRAVENOUS
  Filled 2023-04-28: qty 100

## 2023-04-28 MED ORDER — CARVEDILOL 6.25 MG PO TABS
6.2500 mg | ORAL_TABLET | Freq: Two times a day (BID) | ORAL | Status: DC
  Administered 2023-04-28 – 2023-05-02 (×7): 6.25 mg via ORAL
  Filled 2023-04-28 (×7): qty 1

## 2023-04-28 MED ORDER — IOHEXOL 350 MG/ML SOLN
68.0000 mL | Freq: Once | INTRAVENOUS | Status: AC | PRN
  Administered 2023-04-28: 68 mL via INTRAVENOUS

## 2023-04-28 MED ORDER — RENA-VITE PO TABS
1.0000 | ORAL_TABLET | Freq: Every day | ORAL | Status: DC
  Administered 2023-04-30 – 2023-05-02 (×3): 1 via ORAL
  Filled 2023-04-28 (×3): qty 1

## 2023-04-28 MED ORDER — ALBUTEROL SULFATE (2.5 MG/3ML) 0.083% IN NEBU: 2.5000 mg | INHALATION_SOLUTION | Freq: Four times a day (QID) | RESPIRATORY_TRACT | Status: DC | PRN

## 2023-04-28 MED ORDER — ACETAMINOPHEN 325 MG PO TABS: 650.0000 mg | ORAL_TABLET | Freq: Four times a day (QID) | ORAL | Status: DC | PRN

## 2023-04-28 MED ORDER — ALPRAZOLAM 0.5 MG PO TABS: 0.5000 mg | ORAL_TABLET | Freq: Three times a day (TID) | ORAL | Status: DC | PRN

## 2023-04-28 MED ORDER — TRAZODONE HCL 50 MG PO TABS: 150.0000 mg | ORAL_TABLET | Freq: Every evening | ORAL | Status: DC | PRN

## 2023-04-28 MED ORDER — SENNOSIDES-DOCUSATE SODIUM 8.6-50 MG PO TABS: 1.0000 | ORAL_TABLET | Freq: Every evening | ORAL | Status: DC | PRN

## 2023-04-28 MED ORDER — HYDRALAZINE HCL 20 MG/ML IJ SOLN
5.0000 mg | Freq: Four times a day (QID) | INTRAMUSCULAR | Status: DC | PRN
Start: 2023-04-28 — End: 2023-05-03

## 2023-04-28 MED ORDER — ONDANSETRON HCL 4 MG PO TABS: 4.0000 mg | ORAL_TABLET | Freq: Four times a day (QID) | ORAL | Status: DC | PRN

## 2023-04-28 MED ORDER — ACETAMINOPHEN 650 MG RE SUPP: 650.0000 mg | Freq: Four times a day (QID) | RECTAL | Status: DC | PRN

## 2023-04-28 MED ORDER — CHLORHEXIDINE GLUCONATE CLOTH 2 % EX PADS
6.0000 | MEDICATED_PAD | Freq: Every day | CUTANEOUS | Status: DC
  Administered 2023-04-29 – 2023-04-30 (×2): 6 via TOPICAL

## 2023-04-28 MED ORDER — INSULIN ASPART 100 UNIT/ML IJ SOLN: 0.0000 [IU] | Freq: Three times a day (TID) | INTRAMUSCULAR | Status: DC

## 2023-04-28 NOTE — Assessment & Plan Note (Signed)
At baseline, patient range over the last 2 weeks has been 10.5-12.6

## 2023-04-28 NOTE — ED Notes (Signed)
Phlebotomy at bedside.

## 2023-04-28 NOTE — Assessment & Plan Note (Signed)
-  Registered dietitian has been consulted 

## 2023-04-28 NOTE — ED Notes (Signed)
Lab called to come draw patient's labs.

## 2023-04-28 NOTE — ED Triage Notes (Signed)
Patient BIB [facility] from [facility] c/o shortness of breath.  EMS reports patient's dialysis schedule got messed up d/t the holidays.  EMS reports staff told them that while doing PT, patient had a "syncopal episode" with oxygen saturations in the 60s.  EMS placed patient on  BiPap.  20 R AC

## 2023-04-28 NOTE — H&P (Addendum)
History and Physical   Lori Stanley HYQ:[id number] DOB: 1931-08-07 DOA: 04/28/2023  PCP: [name], MD  Outpatient Specialists: Dr. Wynelle Link, nephrology Patient coming from: Dialysis via EMS  I have personally briefly reviewed patient's old medical records in [facility] EMR.  Chief Concern: Syncope, oxygen desaturation  HPI: Lori Stanley is a 89 year old female with history of end-stage renal disease on hemodialysis MWF, depression, anxiety, GERD, heart failure preserved ejection fraction, who presents emergency department for chief concerns of syncope, and O2 desaturation.  Vitals in the ED showed temperature 99, respiration rate of 20, heart rate 69, blood pressure 124/61, SpO2 96% on 4 L nasal cannula.  Serum sodium is 138, potassium 3.3, chloride 100, bicarb 25, BUN 26, serum creatinine 3.90, EGFR of 11, nonfasting blood glucose 85, WBC 7.5, hemoglobin 11.5, platelets of 275.  BNP was elevated at 224.4.  High sensitive troponin was 58.  COVID/influenza A/influenza B/RSV PCR were negative.  AST, ALT, alk phos, T. bili were within normal limits.  ED treatment: None.  EDP consulted nephrology who states the patient will get dialysis tomorrow, 04/29/2023. -------------------------------- At bedside, patient was able to tell me her first name, age of 89, current year of 89, and current location is [facility].   She denies chest pain. She denies feeling short of breath right now. She reprots she wears 2 L [place] as needed and not all the time.   She reports she was a dialysis center when they called ambulance to take her to the hospital. She states she thinks she passed out.   Social history: She lives at home on her own. She has nieces and/or nephews look in on her and cooks for her. She does not having living children. She denies tobacco, etoh, and recreational drug use. She formerly worked in a mill.   ROS: Constitutional: no weight change, no  fever ENT/Mouth: no sore throat, no rhinorrhea Eyes: no eye pain, no vision changes Cardiovascular: no chest pain, no dyspnea,  no edema, no palpitations Respiratory: no cough, no sputum, no wheezing Gastrointestinal: no nausea, no vomiting, no diarrhea, no constipation Genitourinary: no urinary incontinence, no dysuria, no hematuria Musculoskeletal: no arthralgias, no myalgias Skin: no skin lesions, no pruritus, Neuro: + weakness, no loss of consciousness, + syncope Psych: no anxiety, no depression, no decrease appetite Heme/Lymph: no bruising, no bleeding  ED Course: Discussed with EDP, patient requiring hospitalization for chief concerns of fluid overload.  Assessment/Plan  Principal Problem:   Acute hypoxemic respiratory failure (HCC) Active Problems:   Streptococcal bacteremia   ESRD (end stage renal disease) (HCC)   Essential hypertension   Type 2 diabetes mellitus without complication, without long-term current use of insulin (HCC)   Anxiety   GERD (gastroesophageal reflux disease)   Hyperlipidemia   Syncopal episodes   Anemia in chronic kidney disease   Memory change   Severe protein-calorie malnutrition (HCC)   Assessment and Plan:  * Acute hypoxemic respiratory failure (HCC) Presumed secondary to fluid overload in setting of dialysis schedule change during holiday season Strict I's and O's Nephrology has been consulted Continue BiPAP Continuous pulse oximetry ordered on admission Admit to stepdown, inpatient as patient is currently on BiPAP initially however at bedside patient is on 4 L nasal cannula, stepdown bed changed to telemetry medical, inpatient  Streptococcal bacteremia History of Streptococcus anginosus bacteremia  Cefazolin 2 g IV daily on Monday Wednesday Friday (hemodialysis days) with projected end date on 05/05/2023 was resumed on admission with pharmacy consultation  noting holiday change in her dialysis outpatient  ESRD (end stage renal disease)  [facility]) Nephrology has been consulted by EDP and via Epic order by myself on admission  Essential hypertension Home carvedilol 6.25 mg p.o. twice daily resumed on admission Hydralazine 5 mg IV every 6 hours as needed for SBP > 175, 5 days ordered  Type 2 diabetes mellitus without complication, without long-term current use of insulin (HCC) Insulin SSI with at bedtime coverage ordered, renal on HD dosing D50 25 mL, IV as needed for low blood sugar, 5 days ordered Goal inpatient blood glucose levels 140-180  Anxiety Home alprazolam 0.5 mg p.o. 3 times daily as needed for anxiety resumed on admission  Severe protein-calorie malnutrition [facility]) Registered dietitian has been consulted  Anemia in chronic kidney disease At baseline, patient range over the last 2 weeks has been 10.5-12.6  Syncopal episodes Fall precautions ordered  Chart reviewed.   DVT prophylaxis: Heparin 5000 units subcutaneous every 8 hours Code Status: Full code Diet: N.p.o. while on CPAP; once patient is off BiPAP, renal/carb modified diet ordered on admission Family Communication: a phone call was offered, she declined stating that her niece already knows she is being admitted to the hospital today Disposition Plan: Pending clinical course Consults called: Nephrology Admission status: Telemetry medical, inpatient  Past Medical History:  Diagnosis Date   Acute heart failure (HCC)    Acute on chronic heart failure with preserved ejection fraction (HFpEF) (HCC) 06/05/2019   Anemia    Anemia due to stage 4 chronic kidney disease (HCC) 08/27/2019   Anxiety    CHF (congestive heart failure) (HCC)    Chronic kidney disease    stage V; end stage renal disease   Chronic kidney disease, stage 5 (HCC) 01/23/2019   Chronic low back pain    Depression    Diabetes mellitus without complication (HCC)    Essential hypertension, malignant 05/24/2013   GERD (gastroesophageal reflux disease)    Hematoma 07/17/2020    Hyperlipidemia    Hypertension    MI (myocardial infarction) (HCC)    Obesity    Proteinuria 01/23/2019   Respiratory failure (HCC) 04/09/2017   SOB (shortness of breath) 05/24/2013   Unilateral inguinal hernia with obstruction and without gangrene    Volume overload 08/30/2019   Past Surgical History:  Procedure Laterality Date   A/V FISTULAGRAM Left 01/15/2020   Procedure: A/V FISTULAGRAM;  Surgeon: Renford Dills, MD;  Location: [facility] INVASIVE CV LAB;  Service: Cardiovascular;  Laterality: Left;   ABDOMINAL HYSTERECTOMY     APPENDECTOMY     AV FISTULA PLACEMENT Left 11/13/2019   Procedure: INSERTION OF ARTERIOVENOUS (AV) GORE-TEX GRAFT ARM ( BRACHIAL AXILLARY );  Surgeon: Renford Dills, MD;  Location: [facility] ORS;  Service: Vascular;  Laterality: Left;   DIALYSIS/PERMA CATHETER INSERTION N/A 06/07/2019   Procedure: DIALYSIS/PERMA CATHETER INSERTION;  Surgeon: Renford Dills, MD;  Location: [facility] INVASIVE CV LAB;  Service: Cardiovascular;  Laterality: N/A;   DIALYSIS/PERMA CATHETER REMOVAL N/A 02/11/2020   Procedure: DIALYSIS/PERMA CATHETER REMOVAL;  Surgeon: Renford Dills, MD;  Location: [facility] INVASIVE CV LAB;  Service: Cardiovascular;  Laterality: N/A;   ESOPHAGOGASTRODUODENOSCOPY (EGD) WITH PROPOFOL N/A 11/30/2021   Procedure: ESOPHAGOGASTRODUODENOSCOPY (EGD) WITH PROPOFOL;  Surgeon: Toney Reil, MD;  Location: [facility] ENDOSCOPY;  Service: Gastroenterology;  Laterality: N/A;   EYE SURGERY     HERNIA REPAIR     INCISION AND DRAINAGE PERIRECTAL ABSCESS     INGUINAL HERNIA REPAIR N/A 07/12/2020  Procedure: HERNIA REPAIR INGUINAL ADULT;  Surgeon: Henrene Dodge, MD;  Location: [facility] ORS;  Service: General;  Laterality: N/A;   KNEE SURGERY     THYROID SURGERY     VAGINAL HYSTERECTOMY     Social History:  reports that she has quit smoking. Her smoking use included cigarettes. She has never used smokeless tobacco. She reports that she does not drink alcohol and does not use  drugs.  Allergies  Allergen Reactions   Antihistamines, Chlorpheniramine-Type Other (See Comments)    Unknown reaction.   Other Other (See Comments)   Paxil [Paroxetine]     "makes her feel funny" not in a good way   Codeine Rash and Other (See Comments)    Mouth sore   Family History  Problem Relation Age of Onset   Heart attack Mother    Heart disease Sister    Cancer Sister    Family history: Family history reviewed and not pertinent.  Prior to Admission medications   Medication Sig Start Date End Date Taking? Authorizing Provider  acetaminophen (TYLENOL) 500 MG tablet Take 500-1,000 mg by mouth every 6 (six) hours as needed for mild pain or fever.     [provider]  albuterol (VENTOLIN HFA) 108 (90 Base) MCG/ACT inhaler Inhale 2 puffs into the lungs every 6 (six) hours as needed for wheezing or shortness of breath. 02/02/23  Yes Simmons-Robinson, Tawanna Cooler, MD  ALPRAZolam (XANAX) 0.5 MG tablet Take 1 tablet (0.5 mg total) by mouth 3 (three) times daily as needed for anxiety. 04/18/23   [place], Eric J, DO  brimonidine (ALPHAGAN) 0.2 % ophthalmic solution Place 1 drop into both eyes 2 (two) times daily.   Yes [provider]  carvedilol (COREG) 6.25 MG tablet Take 1 tablet (6.25 mg total) by mouth 2 (two) times daily. 04/18/23  Yes [place], Alvira Philips, DO  ceFAZolin (ANCEF) IVPB Inject 2 g into the vein every Monday, Wednesday, and Friday with hemodialysis for 21 days. Indication:  S. Anginosus bacteremia Last Day of Therapy:  05/05/2023 Labs - Once weekly:  CBC/D and CMP Fax weekly lab results  promptly to [phone] To be administered at HD clinic 04/19/23 05/10/23  [place], Eric J, DO  fluticasone (FLONASE) 50 MCG/ACT nasal spray SPRAY 1 SPRAY INTO BOTH NOSTRILS DAILY. Patient taking differently: Place 1 spray into both nostrils daily as needed for allergies. 05/03/22   Alfredia Ferguson, PA-C  loratadine (CLARITIN) 10 MG tablet Take 1 tablet (10 mg total) by mouth  daily. Patient taking differently: Take 10 mg by mouth daily as needed for allergies. 08/30/22   Simmons-Robinson, Makiera, MD  multivitamin (RENA-VIT) TABS tablet Take 1 tablet by mouth daily.  09/13/19   [provider]  omeprazole (PRILOSEC) 40 MG capsule Take 1 capsule (40 mg total) by mouth daily. 11/30/21   Esaw Grandchild A, DO  sertraline (ZOLOFT) 25 MG tablet TAKE 1 TABLET BY MOUTH EVERY DAY 12/05/22   Simmons-Robinson, Tawanna Cooler, MD  torsemide (DEMADEX) 100 MG tablet TAKE 1 TABLET BY MOUTH EVERY DAY 02/22/23   Ostwalt, Edmon Crape, PA-C  traZODone (DESYREL) 150 MG tablet TAKE 1 TABLET BY MOUTH EVERYDAY AT BEDTIME Patient taking differently: Take 150 mg by mouth at bedtime as needed for sleep. 01/12/23   [name], MD   Physical Exam: Vitals:   04/28/23 1210 04/28/23 1230 04/28/23 1300 04/28/23 1417  BP:  (!) 92/49 132/60   Pulse: 65   76  Resp: 13 10 11 17   Temp:  TempSrc:      SpO2: 100%  100% 100%  Weight:       Constitutional: appears age-appropriate, frail, NAD, calm Eyes: PERRL, lids and conjunctivae normal ENMT: Mucous membranes are moist. Posterior pharynx clear of any exudate or lesions. Age-appropriate dentition.  Mild hearing loss Neck: normal, supple, no masses, no thyromegaly Respiratory: clear to auscultation bilaterally, no wheezing, no crackles. Normal respiratory effort. No accessory muscle use.  Cardiovascular: Regular rate and rhythm, no murmurs / rubs / gallops.  Trace bilateral lower extremity edema. 2+ pedal pulses. No carotid bruits.  Abdomen: no tenderness, no masses palpated, no hepatosplenomegaly. Bowel sounds positive.  Musculoskeletal: no clubbing / cyanosis. No joint deformity upper and lower extremities. Good ROM, no contractures, no atrophy. Normal muscle tone.  Skin: no rashes, lesions, ulcers. No induration Neurologic: Sensation intact. Strength 5/5 in all 4.  Psychiatric: Normal judgment and insight. Alert and oriented x 3. Normal  mood.   EKG: independently reviewed, showing sinus rhythm with rate 74, QTc 444  Chest x-ray on Admission: I personally reviewed and I agree with radiologist reading as below.  CT Angio Chest PE W and/or Wo Contrast Result Date: 04/28/2023 CLINICAL DATA:  Pulmonary embolism (PE) suspected, high prob. Shortness of breath. EXAM: CT ANGIOGRAPHY CHEST WITH CONTRAST TECHNIQUE: Multidetector CT imaging of the chest was performed using the standard protocol during bolus administration of intravenous contrast. Multiplanar CT image reconstructions and MIPs were obtained to evaluate the vascular anatomy. RADIATION DOSE REDUCTION: This exam was performed according to the departmental dose-optimization program which includes automated exposure control, adjustment of the mA and/or kV according to patient size and/or use of iterative reconstruction technique. CONTRAST:  68mL OMNIPAQUE IOHEXOL 350 MG/ML SOLN COMPARISON:  CT angiography chest from 05/12/2013. FINDINGS: Cardiovascular: No evidence of embolism to the proximal subsegmental pulmonary artery level. Mild cardiomegaly. No pericardial effusion. No aortic aneurysm. There are coronary artery calcifications, in keeping with coronary artery disease. There are also moderate to severe peripheral atherosclerotic vascular calcifications of thoracic aorta and its major branches. Mediastinum/Nodes: Visualized thyroid gland appears grossly unremarkable. No solid / cystic mediastinal masses. The esophagus is nondistended precluding optimal assessment. No axillary, mediastinal or hilar lymphadenopathy by size criteria. Lungs/Pleura: The central tracheo-bronchial tree is patent. There are patchy areas of linear, plate-like atelectasis and/or scarring throughout bilateral lungs. Mild upper lobe predominant centrilobular emphysematous changes noted. No mass or consolidation. No pleural effusion or pneumothorax. No suspicious lung nodules. Upper Abdomen: When correlated with recent  CT scan renal stone protocol from 04/06/2023, redemonstration of dystrophic calcification in the inferomedial liver partially seen but measuring at least 1.6 x 2.1 cm. There is a subcapsular 2.5 x 3.2 cm cyst in the medial aspect of the spleen, unchanged. Remaining visualized upper abdominal viscera within normal limits. Musculoskeletal: The visualized soft tissues of the chest wall are grossly unremarkable. No suspicious osseous lesions. There are moderate multilevel degenerative changes of the visualized cervicothoracic spine. There is fusion of C7 and T1 vertebral bodies with resultant kyphosis. There is also partial fusion of T8 and T9 vertebral bodies. There is endplate irregularity at T9/T10 vertebral bodies. No significant surrounding fat stranding. Findings are most likely degenerative. However, correlate clinically to determine the need for additional imaging with MRI to exclude discitis osteomyelitis. Review of the MIP images confirms the above findings. IMPRESSION: 1. No embolism to the proximal subsegmental pulmonary artery level. 2. No lung mass, consolidation, pleural effusion or pneumothorax. 3. Partial fusion of C7-T1 and T8-T9 vertebral bodies, new  since the prior study. There is endplate irregularity at T8-T9 levels. No surrounding fat stranding. Findings are most likely degenerative. However, correlate clinically to determine the need for additional imaging with MRI to exclude discitis osteomyelitis. Aortic Atherosclerosis (ICD10-I70.0) and Emphysema (ICD10-J43.9). Electronically Signed   By: Jules Schick M.D.   On: 04/28/2023 15:14   DG Chest Portable 1 View Result Date: 04/28/2023 CLINICAL DATA:  Shortness of breath. EXAM: PORTABLE CHEST 1 VIEW COMPARISON:  Chest radiograph dated April 21, 2023. FINDINGS: Low lung volumes with similar chronic elevation of the right hemidiaphragm. Stable enlargement of the cardiac silhouette. Aortic atherosclerosis. Similar chronic appearing interstitial  changes. No focal consolidation, sizeable pleural effusion, or pneumothorax. Advanced degenerative changes of the bilateral shoulders, more pronounced on the left. Diffusely decreased osseous mineralization. IMPRESSION: 1. Low lung volumes.  No acute cardiopulmonary findings. 2. Mild cardiomegaly. 3. Chronic elevation of the right hemidiaphragm. Electronically Signed   By: Hart Robinsons M.D.   On: 04/28/2023 12:38   Labs on Admission: I have personally reviewed following labs  CBC: Recent Labs  Lab 04/28/23 1243  WBC 7.5  NEUTROABS 6.2  HGB 11.5*  HCT 38.6  MCV 93.9  PLT 275   Basic Metabolic Panel: Recent Labs  Lab 04/28/23 1243  NA 138  K 3.3*  CL 100  CO2 25  GLUCOSE 85  BUN 26*  CREATININE 3.90*  CALCIUM 8.4*   GFR: Estimated Creatinine Clearance: 6.2 mL/min (A) (by C-G formula based on SCr of 3.9 mg/dL (H)).  Liver Function Tests: Recent Labs  Lab 04/28/23 1243  AST 17  ALT <5  ALKPHOS 99  BILITOT 0.5  PROT 5.8*  ALBUMIN 2.6*   Urine analysis:    Component Value Date/Time   COLORURINE YELLOW (A) 04/07/2023 0353   APPEARANCEUR CLOUDY (A) 04/07/2023 0353   APPEARANCEUR Clear 05/11/2013 2255   LABSPEC 1.013 04/07/2023 0353   LABSPEC 1.010 05/11/2013 2255   PHURINE 6.0 04/07/2023 0353   GLUCOSEU NEGATIVE 04/07/2023 0353   GLUCOSEU >=500 05/11/2013 2255   HGBUR MODERATE (A) 04/07/2023 0353   BILIRUBINUR NEGATIVE 04/07/2023 0353   BILIRUBINUR Negative 04/06/2023 1147   BILIRUBINUR Negative 05/11/2013 2255   KETONESUR NEGATIVE 04/07/2023 0353   PROTEINUR 100 (A) 04/07/2023 0353   UROBILINOGEN 0.2 04/06/2023 1147   NITRITE NEGATIVE 04/07/2023 0353   LEUKOCYTESUR LARGE (A) 04/07/2023 0353   LEUKOCYTESUR Negative 05/11/2013 2255   This document was prepared using Dragon Voice Recognition software and may include unintentional dictation errors.  Dr. Sedalia Muta [facility]  If 7PM-7AM, please contact overnight-coverage provider If 7AM-7PM, please  contact day attending provider [email or web address]  04/28/2023, 4:15 PM

## 2023-04-28 NOTE — Assessment & Plan Note (Signed)
Patient received dialysis today. Will resume her normal schedule from Monday

## 2023-04-28 NOTE — Assessment & Plan Note (Addendum)
Presumed secondary to fluid overload in setting of dialysis schedule change during holiday season Strict I's and O's Nephrology has been consulted Continue BiPAP Continuous pulse oximetry ordered on admission Admit to stepdown, inpatient as patient is currently on BiPAP initially however at bedside patient is on 4 L nasal cannula, stepdown bed changed to telemetry medical, inpatient

## 2023-04-28 NOTE — Assessment & Plan Note (Signed)
Patient apparently quite sensitive during dialysis with history of multiple short acting syncopal episodes with removal of extra fluid Fall precautions ordered

## 2023-04-28 NOTE — ED Provider Notes (Addendum)
[facility] Provider Note    Event Date/Time   First MD Initiated Contact with Patient 04/28/23 1137     (approximate)   History   Chief Complaint: Shortness of Breath   HPI  Lori Stanley is a 89 y.o. female with a history of CHF GERD hypertension MI ESRD on hemodialysis who comes ED due to shortness of breath.  Patient had syncope this morning while doing physical therapy, oxygen saturation was reportedly in the 60s.  EMS found the patient to be hypoxic and placed her on CPAP.  Patient denies pain, does endorse shortness of breath.          Physical Exam   Triage Vital Signs: ED Triage Vitals  Encounter Vitals Group     BP --      Systolic BP Percentile --      Diastolic BP Percentile --      Pulse Rate 04/28/23 1133 74     Resp 04/28/23 1133 15     Temp --      Temp src --      SpO2 04/28/23 1133 96 %     Weight 04/28/23 1134 88 lb 2.9 oz (40 kg)     Height --      Head Circumference --      Peak Flow --      Pain Score 04/28/23 1134 0     Pain Loc --      Pain Education --      Exclude from Growth Chart --     Most recent vital signs: Vitals:   04/28/23 1300 04/28/23 1417  BP: 132/60   Pulse:  76  Resp: 11 17  Temp:    SpO2: 100% 100%    General: Awake, moderate respiratory distress.  CV:  Good peripheral perfusion.  Regular rate rhythm.  Left upper extremity AV fistula with palpable pulse, no significant thrill Resp:  Normal effort.  Crackles in right upper lung, diminished breath sounds in right lower lung, diminished breath sounds throughout the left lung. Abd:  No distention.  Soft nontender Other:  No lower extremity edema.  Normal mental status   ED Results / Procedures / Treatments   Labs (all labs ordered are listed, but only abnormal results are displayed) Labs Reviewed  COMPREHENSIVE METABOLIC PANEL - Abnormal; Notable for the following components:      Result Value   Potassium 3.3 (*)    BUN 26 (*)     Creatinine, Ser 3.90 (*)    Calcium 8.4 (*)    Total Protein 5.8 (*)    Albumin 2.6 (*)    GFR, Estimated 11 (*)    All other components within normal limits  BRAIN NATRIURETIC PEPTIDE - Abnormal; Notable for the following components:   B Natriuretic Peptide 224.4 (*)    All other components within normal limits  CBC WITH DIFFERENTIAL/PLATELET - Abnormal; Notable for the following components:   Hemoglobin 11.5 (*)    MCHC 29.8 (*)    All other components within normal limits  TROPONIN I (HIGH SENSITIVITY) - Abnormal; Notable for the following components:   Troponin I (High Sensitivity) 58 (*)    All other components within normal limits  RESP PANEL BY RT-PCR (RSV, FLU A&B, COVID)  [id number]  TROPONIN I (HIGH SENSITIVITY)     EKG Interpreted by me Sinus rhythm rate of 74.  Normal axis, normal intervals.  Normal QRS ST segments and T waves.   RADIOLOGY  Chest x-ray interpreted by me, shows low lung volume but otherwise clear without frank pulmonary edema or large pleural effusion.  Radiology report reviewed   PROCEDURES:  .Critical Care  Performed by: Sharman Cheek, MD Authorized by: Sharman Cheek, MD   Critical care provider statement:    Critical care time (minutes):  35   Critical care time was exclusive of:  Separately billable procedures and treating other patients   Critical care was necessary to treat or prevent imminent or life-threatening deterioration of the following conditions:  Respiratory failure and renal failure   Critical care was time spent personally by me on the following activities:  Development of treatment plan with patient or surrogate, discussions with consultants, evaluation of patient's response to treatment, examination of patient, obtaining history from patient or surrogate, ordering and performing treatments and interventions, ordering and review of laboratory studies, ordering and review of radiographic studies, pulse oximetry, re-evaluation  of patient's condition and review of old charts   Care discussed with: admitting provider      MEDICATIONS ORDERED IN ED: Medications  iohexol (OMNIPAQUE) 350 MG/ML injection 68 mL (68 mLs Intravenous Contrast Given 04/28/23 1412)     IMPRESSION / MDM / ASSESSMENT AND PLAN / ED COURSE  I reviewed the triage vital signs and the nursing notes.  DDx: Pulmonary edema, pleural effusion, pneumothorax, pneumonia, non-STEMI, electrolyte derangement  Patient's presentation is most consistent with acute presentation with potential threat to life or bodily function.     Clinical Course as of 04/28/23 1425  Fri Apr 28, 2023  1139 Patient arrives in respiratory distress, has markedly diminished breath sounds.  Arrives on CPAP, BiPAP started on arrival.  Will check labs and chest x-ray. [PS]    Clinical Course User Index [PS] Sharman Cheek, MD   ----------------------------------------- 2:25 PM on 04/28/2023 ----------------------------------------- Case d/w hospitalist   FINAL CLINICAL IMPRESSION(S) / ED DIAGNOSES   Final diagnoses:  Syncope, unspecified syncope type  Acute respiratory failure with hypoxia (HCC)  ESRD on hemodialysis (HCC)     Rx / DC Orders   ED Discharge Orders     None        Note:  This document was prepared using Dragon voice recognition software and may include unintentional dictation errors.   Sharman Cheek, MD 04/28/23 1425    Sharman Cheek, MD 04/28/23 1425

## 2023-04-28 NOTE — Assessment & Plan Note (Addendum)
Home carvedilol 6.25 mg p.o. twice daily resumed on admission Hydralazine 5 mg IV every 6 hours as needed for SBP > 175, 5 days ordered

## 2023-04-28 NOTE — Assessment & Plan Note (Signed)
Home alprazolam 0.5 mg p.o. 3 times daily as needed for anxiety resumed on admission

## 2023-04-28 NOTE — Hospital Course (Addendum)
[name] is a 89 year old female with history of end-stage renal disease on hemodialysis MWF, depression, anxiety, GERD, heart failure preserved ejection fraction, who presents emergency department for chief concerns of syncope, and O2 desaturation.  Vitals in the ED showed temperature 99, respiration rate of 20, heart rate 69, blood pressure 124/61, SpO2 96% on 4 L nasal cannula.  Serum sodium is 138, potassium 3.3, chloride 100, bicarb 25, BUN 26, serum creatinine 3.90, EGFR of 11, nonfasting blood glucose 85, WBC 7.5, hemoglobin 11.5, platelets of 275.  BNP was elevated at 224.4.  High sensitive troponin was 58.  COVID/influenza A/influenza B/RSV PCR were negative.  AST, ALT, alk phos, T. bili were within normal limits.  ED treatment: None.  EDP consulted nephrology who states the patient will get dialysis tomorrow, 04/29/2023.

## 2023-04-28 NOTE — ED Notes (Signed)
Lab called, they will come get second trop from patient.

## 2023-04-28 NOTE — Assessment & Plan Note (Signed)
Overnight some hypoglycemia requiring intervention Insulin SSI with at bedtime coverage ordered, renal on HD dosing D50 25 mL, IV as needed for low blood sugar, 5 days ordered Goal inpatient blood glucose levels 140-180

## 2023-04-28 NOTE — Assessment & Plan Note (Signed)
History of Streptococcus anginosus bacteremia  Cefazolin 2 g IV daily on Monday Wednesday Friday (hemodialysis days) with projected end date on 05/05/2023 was resumed on admission with pharmacy consultation noting holiday change in her dialysis outpatient

## 2023-04-28 NOTE — ED Notes (Signed)
Patient to CT with this RN and RT.  Patient tolerated well.  RT at bedside now to wean patient off BiPap.

## 2023-04-29 DIAGNOSIS — F419 Anxiety disorder, unspecified: Secondary | ICD-10-CM

## 2023-04-29 DIAGNOSIS — E43 Unspecified severe protein-calorie malnutrition: Secondary | ICD-10-CM

## 2023-04-29 DIAGNOSIS — N186 End stage renal disease: Secondary | ICD-10-CM

## 2023-04-29 DIAGNOSIS — R55 Syncope and collapse: Secondary | ICD-10-CM | POA: Diagnosis not present

## 2023-04-29 DIAGNOSIS — Z992 Dependence on renal dialysis: Secondary | ICD-10-CM

## 2023-04-29 DIAGNOSIS — J9601 Acute respiratory failure with hypoxia: Secondary | ICD-10-CM | POA: Diagnosis not present

## 2023-04-29 DIAGNOSIS — K219 Gastro-esophageal reflux disease without esophagitis: Secondary | ICD-10-CM

## 2023-04-29 DIAGNOSIS — R7881 Bacteremia: Secondary | ICD-10-CM

## 2023-04-29 DIAGNOSIS — D631 Anemia in chronic kidney disease: Secondary | ICD-10-CM

## 2023-04-29 DIAGNOSIS — I10 Essential (primary) hypertension: Secondary | ICD-10-CM

## 2023-04-29 DIAGNOSIS — B955 Unspecified streptococcus as the cause of diseases classified elsewhere: Secondary | ICD-10-CM

## 2023-04-29 DIAGNOSIS — E119 Type 2 diabetes mellitus without complications: Secondary | ICD-10-CM

## 2023-04-29 LAB — CBC
HCT: 37.5 % (ref 36.0–46.0)
Hemoglobin: 11.3 g/dL — ABNORMAL LOW (ref 12.0–15.0)
MCH: 27.5 pg (ref 26.0–34.0)
MCHC: 30.1 g/dL (ref 30.0–36.0)
MCV: 91.2 fL (ref 80.0–100.0)
Platelets: 322 10*3/uL (ref 150–400)
RBC: 4.11 MIL/uL (ref 3.87–5.11)
RDW: 14.5 % (ref 11.5–15.5)
WBC: 6.3 10*3/uL (ref 4.0–10.5)
nRBC: 0.3 % — ABNORMAL HIGH (ref 0.0–0.2)

## 2023-04-29 LAB — BASIC METABOLIC PANEL
Anion gap: 11 (ref 5–15)
BUN: 29 mg/dL — ABNORMAL HIGH (ref 8–23)
CO2: 27 mmol/L (ref 22–32)
Calcium: 8.6 mg/dL — ABNORMAL LOW (ref 8.9–10.3)
Chloride: 101 mmol/L (ref 98–111)
Creatinine, Ser: 4.41 mg/dL — ABNORMAL HIGH (ref 0.44–1.00)
GFR, Estimated: 9 mL/min — ABNORMAL LOW (ref >60)
Glucose, Bld: 64 mg/dL — ABNORMAL LOW (ref 70–99)
Potassium: 3.4 mmol/L — ABNORMAL LOW (ref 3.5–5.1)
Sodium: 139 mmol/L (ref 135–145)

## 2023-04-29 LAB — GLUCOSE, CAPILLARY
Glucose-Capillary: 143 mg/dL — ABNORMAL HIGH (ref 70–99)
Glucose-Capillary: 204 mg/dL — ABNORMAL HIGH (ref 70–99)
Glucose-Capillary: 78 mg/dL (ref 70–99)
Glucose-Capillary: 84 mg/dL (ref 70–99)

## 2023-04-29 MED ORDER — HEPARIN SODIUM (PORCINE) 1000 UNIT/ML DIALYSIS: 1000.0000 [IU] | INTRAMUSCULAR | Status: DC | PRN

## 2023-04-29 MED ORDER — LIDOCAINE-PRILOCAINE 2.5-2.5 % EX CREA: 1.0000 | TOPICAL_CREAM | CUTANEOUS | Status: DC | PRN

## 2023-04-29 MED ORDER — PENTAFLUOROPROP-TETRAFLUOROETH EX AERO: 1.0000 | INHALATION_SPRAY | CUTANEOUS | Status: DC | PRN

## 2023-04-29 NOTE — Progress Notes (Signed)
Hemodialysis note  Received patient in bed to unit. Alert and oriented.  Informed consent signed and in chart.  Treatment initiated: 0909 Treatment completed: 1350  Patient tolerated well. Transported back to room, alert without acute distress.  Report given to patient's RN.   Access used: LUA AVG Access issues: High arterial pressure  Total UF removed: 1L Medication(s) given:  Ancef 2G IV  Post HD weight: 37.7 kg   [facility]

## 2023-04-29 NOTE — Progress Notes (Signed)
Progress Note   Patient: Lori Stanley UXL:[id number] DOB: 1932/01/21 DOA: 04/28/2023     1 DOS: the patient was seen and examined on 04/29/2023   Brief hospital course: [name] is a 89 year old female with history of end-stage renal disease on hemodialysis MWF, depression, anxiety, GERD, heart failure preserved ejection fraction, who presents emergency department for chief concerns of syncope, and O2 desaturation.  Initially requiring BiPAP, likely due to fluid overload with change in dialysis schedule due to holidays.  History of recent Streptococcus anginosus bacteremia for which she is on IV cefazolin with dialysis until 05/05/2023.  Vitals in the ED showed temperature 99, respiration rate of 20, heart rate 69, blood pressure 124/61, SpO2 96% on 4 L nasal cannula.  Serum sodium is 138, potassium 3.3, chloride 100, bicarb 25, BUN 26, serum creatinine 3.90, EGFR of 11, nonfasting blood glucose 85, WBC 7.5, hemoglobin 11.5, platelets of 275.  BNP was elevated at 224.4.  High sensitive troponin was 58.  COVID/influenza A/influenza B/RSV PCR were negative.  AST, ALT, alk phos, T. bili were within normal limits.  ED treatment: None.  EDP consulted nephrology who states the patient will get dialysis tomorrow, 04/29/2023.  12/28: Vital and labs stable, currently on 3 L of oxygen, with baseline of 2 L use.  Hypoglycemia overnight requiring intervention, poor p.o. intake.  Received dialysis today. Will need another PT and OT evaluation and insurance authorization before returning to her facility.  Patient is extremely frail and malnourished.  Currently full code.  Based on underlying comorbidities and being on dialysis, with history of multiple syncopal episode with dialysis makes her increased risk for mortality. Palliative care was also consulted to discuss goals of care  Assessment and Plan: * Acute hypoxemic respiratory failure (HCC) Presumed secondary to fluid overload in  setting of dialysis schedule change during holiday season Initially required BiPAP but later transition to [place], now saturating well on 3 L of oxygen, uses 2 L at baseline -Continue supplemental oxygen-wean as tolerated back to baseline   Streptococcal bacteremia History of Streptococcus anginosus bacteremia  Cefazolin 2 g IV daily on Monday Wednesday Friday (hemodialysis days) with projected end date on 05/05/2023 was resumed on admission with pharmacy consultation noting holiday change in her dialysis outpatient  ESRD (end stage renal disease) (HCC) Patient received dialysis today. Will resume her normal schedule from Monday  Essential hypertension Home carvedilol 6.25 mg p.o. twice daily resumed on admission Hydralazine 5 mg IV every 6 hours as needed for SBP > 175, 5 days ordered  Type 2 diabetes mellitus without complication, without long-term current use of insulin (HCC) Overnight some hypoglycemia requiring intervention Insulin SSI with at bedtime coverage ordered, renal on HD dosing D50 25 mL, IV as needed for low blood sugar, 5 days ordered Goal inpatient blood glucose levels 140-180  Anemia in chronic kidney disease At baseline, patient range over the last 2 weeks has been 10.5-12.6  Anxiety Home alprazolam 0.5 mg p.o. 3 times daily as needed for anxiety resumed on admission  Severe protein-calorie malnutrition [facility]) Registered dietitian has been consulted  Syncopal episodes Patient apparently quite sensitive during dialysis with history of multiple short acting syncopal episodes with removal of extra fluid Fall precautions ordered    Subjective: Patient was seen during dialysis today.  Per patient she uses 2 L of oxygen as needed before coming to the hospital.  Denies any chest pain or shortness of breath.  Physical Exam: Vitals:   04/29/23 1300 04/29/23 1330  04/29/23 1345 04/29/23 1350  BP: 136/66 131/62 122/65 133/67  Pulse: 73 92 90 86  Resp: 18 (!) 24 (!) 25  (!) 25  Temp:    (!) 97.5 F (36.4 C)  TempSrc:    Oral  SpO2: 100% 100% 100% 100%  Weight:      Height:       General.  Very frail and cachectic elderly lady, in no acute distress. Pulmonary.  Lungs clear bilaterally, normal respiratory effort. CV.  Regular rate and rhythm, no JVD, rub or murmur. Abdomen.  Soft, nontender, nondistended, BS positive. CNS.  Alert and oriented .  No focal neurologic deficit. Extremities.  No edema, no cyanosis, pulses intact and symmetrical.   Data Reviewed: Prior data reviewed  Family Communication: Called niece with no response.  Disposition: Status is: Inpatient Remains inpatient appropriate because: Severity of illness  Planned Discharge Destination: Skilled nursing facility  DVT prophylaxis.  Subcu heparin Time spent: 50 minutes  This record has been created using Conservation officer, historic buildings. Errors have been sought and corrected,but may not always be located. Such creation errors do not reflect on the standard of care.   Author: Arnetha Courser, MD 04/29/2023 1:58 PM  For on call review [email or web address].

## 2023-04-29 NOTE — Progress Notes (Signed)
[facility]  Dialysis Note   Subjective:   Seen and examined on hemodialysis treatment.  She is admitted for decreased oxygen saturation and a presyncopal episode.   Tolerating dialysis well. Scheduled to get her antibiotic cefazolin in the last 30 minutes of dialysis for a diagnosis of Streptococcus anginosus bacteremia.   HEMODIALYSIS FLOWSHEET:  Blood Flow Rate (mL/min): 0 mL/min Arterial Pressure (mmHg): -48.08 mmHg Venous Pressure (mmHg): 79.19 mmHg TMP (mmHg): 9.7 mmHg Ultrafiltration Rate (mL/min): 866 mL/min Dialysate Flow Rate (mL/min): 299 ml/min    Objective:  Vital signs in last 24 hours:  Temp:  [97.5 F (36.4 C)-98 F (36.7 C)] 97.5 F (36.4 C) (12/28 1350) Pulse Rate:  [63-92] 86 (12/28 1350) Resp:  [16-27] 25 (12/28 1350) BP: (74-154)/(46-78) 133/67 (12/28 1350) SpO2:  [94 %-100 %] 100 % (12/28 1350) Weight:  [40.3 kg-44.1 kg] 40.3 kg (12/28 0848)  Weight change:  Filed Weights   04/28/23 1134 04/28/23 1832 04/29/23 0848  Weight: 40 kg 44.1 kg 40.3 kg    Intake/Output: No intake/output data recorded.   Intake/Output this shift:  Total I/O In: -  Out: 1 [Other:1]  Physical Exam: General: NAD,   Head: Normocephalic, atraumatic. Moist oral mucosal membranes  Eyes: Anicteric, PERRL  Neck: Supple, trachea midline  Lungs:  Clear to auscultation  Heart: Regular rate and rhythm  Abdomen:  Soft, nontender,   Extremities:  peripheral edema.  Neurologic: Nonfocal, moving all four extremities  Skin: No lesions  Access:     Basic Metabolic Panel: Recent Labs  Lab 04/28/23 1243 04/29/23 0638  NA 138 139  K 3.3* 3.4*  CL 100 101  CO2 25 27  GLUCOSE 85 64*  BUN 26* 29*  CREATININE 3.90* 4.41*  CALCIUM 8.4* 8.6*    Liver Function Tests: Recent Labs  Lab 04/28/23 1243  AST 17  ALT <5  ALKPHOS 99  BILITOT 0.5  PROT 5.8*  ALBUMIN 2.6*   No results for input(s): "LIPASE", "AMYLASE" in the last 168 hours. No results for  input(s): "AMMONIA" in the last 168 hours.  CBC: Recent Labs  Lab 04/28/23 1243 04/29/23 0638  WBC 7.5 6.3  NEUTROABS 6.2  --   HGB 11.5* 11.3*  HCT 38.6 37.5  MCV 93.9 91.2  PLT 275 322    Cardiac Enzymes: No results for input(s): "CKTOTAL", "CKMB", "CKMBINDEX", "TROPONINI" in the last 168 hours.  BNP: Invalid input(s): "POCBNP"  CBG: Recent Labs  Lab 04/28/23 2156 04/28/23 2239 04/28/23 2314 04/29/23 0015 04/29/23 0906  GLUCAP 69* 69* 66* 143* 204*    Microbiology: Results for orders placed or performed during the hospital encounter of 04/28/23  Resp panel by RT-PCR (RSV, Flu A&B, Covid) Anterior Nasal Swab     Status: None   Collection Time: 04/28/23 11:45 AM   Specimen: Anterior Nasal Swab  Result Value Ref Range Status   SARS Coronavirus 2 by RT PCR NEGATIVE NEGATIVE Final    Comment: (NOTE) SARS-CoV-2 target nucleic acids are NOT DETECTED.  The SARS-CoV-2 RNA is generally detectable in upper respiratory specimens during the acute phase of infection. The lowest concentration of SARS-CoV-2 viral copies this assay can detect is 138 copies/mL. A negative result does not preclude SARS-Cov-2 infection and should not be used as the sole basis for treatment or other patient management decisions. A negative result may occur with  improper specimen collection/handling, submission of specimen other than nasopharyngeal swab, presence of viral mutation(s) within the areas targeted by this assay, and inadequate  number of viral copies(<138 copies/mL). A negative result must be combined with clinical observations, patient history, and epidemiological information. The expected result is Negative.  Fact Sheet for Patients:  BloggerCourse.com  Fact Sheet for Healthcare Providers:  SeriousBroker.it  This test is no t yet approved or cleared by the [place] FDA and  has been authorized for detection and/or diagnosis  of SARS-CoV-2 by FDA under an Emergency Use Authorization (EUA). This EUA will remain  in effect (meaning this test can be used) for the duration of the COVID-19 declaration under Section 564(b)(1) of the Act, 21 U.S.C.section 360bbb-3(b)(1), unless the authorization is terminated  or revoked sooner.       Influenza A by PCR NEGATIVE NEGATIVE Final   Influenza B by PCR NEGATIVE NEGATIVE Final    Comment: (NOTE) The Xpert Xpress SARS-CoV-2/FLU/RSV plus assay is intended as an aid in the diagnosis of influenza from Nasopharyngeal swab specimens and should not be used as a sole basis for treatment. Nasal washings and aspirates are unacceptable for Xpert Xpress SARS-CoV-2/FLU/RSV testing.  Fact Sheet for Patients: BloggerCourse.com  Fact Sheet for Healthcare Providers: SeriousBroker.it  This test is not yet approved or cleared by the [place] FDA and has been authorized for detection and/or diagnosis of SARS-CoV-2 by FDA under an Emergency Use Authorization (EUA). This EUA will remain in effect (meaning this test can be used) for the duration of the COVID-19 declaration under Section 564(b)(1) of the Act, 21 U.S.C. section 360bbb-3(b)(1), unless the authorization is terminated or revoked.     Resp Syncytial Virus by PCR NEGATIVE NEGATIVE Final    Comment: (NOTE) Fact Sheet for Patients: BloggerCourse.com  Fact Sheet for Healthcare Providers: SeriousBroker.it  This test is not yet approved or cleared by the [place] FDA and has been authorized for detection and/or diagnosis of SARS-CoV-2 by FDA under an Emergency Use Authorization (EUA). This EUA will remain in effect (meaning this test can be used) for the duration of the COVID-19 declaration under Section 564(b)(1) of the Act, 21 U.S.C. section 360bbb-3(b)(1), unless the authorization is terminated  or revoked.  Performed at [facility], [address] [place], [place] [address]     Coagulation Studies: No results for input(s): "LABPROT", "INR" in the last 72 hours.  Urinalysis: No results for input(s): "COLORURINE", "LABSPEC", "PHURINE", "GLUCOSEU", "HGBUR", "BILIRUBINUR", "KETONESUR", "PROTEINUR", "UROBILINOGEN", "NITRITE", "LEUKOCYTESUR" in the last 72 hours.  Invalid input(s): "APPERANCEUR"    Imaging: CT Angio Chest PE W and/or Wo Contrast Result Date: 04/28/2023 CLINICAL DATA:  Pulmonary embolism (PE) suspected, high prob. Shortness of breath. EXAM: CT ANGIOGRAPHY CHEST WITH CONTRAST TECHNIQUE: Multidetector CT imaging of the chest was performed using the standard protocol during bolus administration of intravenous contrast. Multiplanar CT image reconstructions and MIPs were obtained to evaluate the vascular anatomy. RADIATION DOSE REDUCTION: This exam was performed according to the departmental dose-optimization program which includes automated exposure control, adjustment of the mA and/or kV according to patient size and/or use of iterative reconstruction technique. CONTRAST:  68mL OMNIPAQUE IOHEXOL 350 MG/ML SOLN COMPARISON:  CT angiography chest from 05/12/2013. FINDINGS: Cardiovascular: No evidence of embolism to the proximal subsegmental pulmonary artery level. Mild cardiomegaly. No pericardial effusion. No aortic aneurysm. There are coronary artery calcifications, in keeping with coronary artery disease. There are also moderate to severe peripheral atherosclerotic vascular calcifications of thoracic aorta and its major branches. Mediastinum/Nodes: Visualized thyroid gland appears grossly unremarkable. No solid / cystic mediastinal masses. The esophagus is nondistended precluding optimal assessment. No  axillary, mediastinal or hilar lymphadenopathy by size criteria. Lungs/Pleura: The central tracheo-bronchial tree is patent. There are patchy areas of linear,  plate-like atelectasis and/or scarring throughout bilateral lungs. Mild upper lobe predominant centrilobular emphysematous changes noted. No mass or consolidation. No pleural effusion or pneumothorax. No suspicious lung nodules. Upper Abdomen: When correlated with recent CT scan renal stone protocol from 04/06/2023, redemonstration of dystrophic calcification in the inferomedial liver partially seen but measuring at least 1.6 x 2.1 cm. There is a subcapsular 2.5 x 3.2 cm cyst in the medial aspect of the spleen, unchanged. Remaining visualized upper abdominal viscera within normal limits. Musculoskeletal: The visualized soft tissues of the chest wall are grossly unremarkable. No suspicious osseous lesions. There are moderate multilevel degenerative changes of the visualized cervicothoracic spine. There is fusion of C7 and T1 vertebral bodies with resultant kyphosis. There is also partial fusion of T8 and T9 vertebral bodies. There is endplate irregularity at T9/T10 vertebral bodies. No significant surrounding fat stranding. Findings are most likely degenerative. However, correlate clinically to determine the need for additional imaging with MRI to exclude discitis osteomyelitis. Review of the MIP images confirms the above findings. IMPRESSION: 1. No embolism to the proximal subsegmental pulmonary artery level. 2. No lung mass, consolidation, pleural effusion or pneumothorax. 3. Partial fusion of C7-T1 and T8-T9 vertebral bodies, new since the prior study. There is endplate irregularity at T8-T9 levels. No surrounding fat stranding. Findings are most likely degenerative. However, correlate clinically to determine the need for additional imaging with MRI to exclude discitis osteomyelitis. Aortic Atherosclerosis (ICD10-I70.0) and Emphysema (ICD10-J43.9). Electronically Signed   By: Jules Schick M.D.   On: 04/28/2023 15:14   DG Chest Portable 1 View Result Date: 04/28/2023 CLINICAL DATA:  Shortness of breath. EXAM:  PORTABLE CHEST 1 VIEW COMPARISON:  Chest radiograph dated April 21, 2023. FINDINGS: Low lung volumes with similar chronic elevation of the right hemidiaphragm. Stable enlargement of the cardiac silhouette. Aortic atherosclerosis. Similar chronic appearing interstitial changes. No focal consolidation, sizeable pleural effusion, or pneumothorax. Advanced degenerative changes of the bilateral shoulders, more pronounced on the left. Diffusely decreased osseous mineralization. IMPRESSION: 1. Low lung volumes.  No acute cardiopulmonary findings. 2. Mild cardiomegaly. 3. Chronic elevation of the right hemidiaphragm. Electronically Signed   By: Hart Robinsons M.D.   On: 04/28/2023 12:38     Medications:     ceFAZolin (ANCEF) IV Stopped (04/29/23 1335)    carvedilol  6.25 mg Oral BID   Chlorhexidine Gluconate Cloth  6 each Topical Q0600   heparin  5,000 Units Subcutaneous Q8H   insulin aspart  0-5 Units Subcutaneous QHS   insulin aspart  0-6 Units Subcutaneous TID WC   multivitamin  1 tablet Oral Daily   pantoprazole  80 mg Oral Daily   acetaminophen **OR** acetaminophen, albuterol, ALPRAZolam, ceFAZolin (ANCEF) IV, dextrose, heparin, hydrALAZINE, lidocaine-prilocaine, ondansetron **OR** ondansetron (ZOFRAN) IV, pentafluoroprop-tetrafluoroeth, senna-docusate, traZODone  Assessment/ Plan:  [name] is a 89 y.o.  female with a past medical history of depression/anxiety/GERD/hypertension/CHF/end-stage renal disease on dialysis on Monday Wednesday Friday schedule now admitted with emergency room with concern for syncope and decreased oxygen saturation. Recent history of strep bacteremia for which she has been on cefazolin during dialysis to be continued until 05/05/2023.  Principal Problem:   Acute hypoxemic respiratory failure (HCC) Active Problems:   Type 2 diabetes mellitus without complication, without long-term current use of insulin (HCC)   GERD (gastroesophageal reflux disease)    Hyperlipidemia   Anxiety  Essential hypertension   Syncopal episodes   Anemia in chronic kidney disease   ESRD (end stage renal disease) (HCC)   Memory change   Severe protein-calorie malnutrition (HCC)   Streptococcal bacteremia    End Stage Renal Disease on hemodialysis: Tolerating dialysis well.  Potassium [facility] vascular lab)  Date Value Ref Range Status  01/15/2020 3.7 3.5 - 5.1 Final    Comment:    Performed at [facility], [address] [place], [place] [address]    Intake/Output Summary (Last 24 hours) at 04/29/2023 1426 Last data filed at 04/29/2023 1350 Gross per 24 hour  Intake --  Output 1 ml  Net -1 ml    2. Hypertension with chronic kidney disease: Blood pressure well-controlled.   BP 133/67 (BP Location: Right Arm)   Pulse 86   Temp (!) 97.5 F (36.4 C) (Oral)   Resp (!) 25   Ht 5\' 3"  (1.6 m)   Wt 40.3 kg   SpO2 100%   BMI 15.74 kg/m   3. Anemia of chronic kidney disease/ kidney injury/chronic disease/acute blood loss: Will continue to monitor. Lab Results  Component Value Date   HGB 11.3 (L) 04/29/2023    4. Secondary Hyperparathyroidism: Binders as required.   Lab Results  Component Value Date   PTH 148 (H) 06/07/2019   CALCIUM 8.6 (L) 04/29/2023   CAION 1.35 11/13/2019   PHOS 4.6 04/17/2023    5.  History of Streptococcus anginosus bacteremia: Will continue cefazolin as ordered.   6.  Respiratory failure: Patient initially needed BiPAP possibly secondary to fluid overload.  Presently stable.  Will continue to monitor.   LOS: 1 Lorain Childes, MD [facility] 12/28/20242:26 PM

## 2023-04-29 NOTE — TOC Initial Note (Addendum)
Transition of Care [facility]) - Initial/Assessment Note    Patient Details  Name: Lori Stanley MRN: [id number] Date of Birth: Jul 13, 1931  Transition of Care [facility]) CM/SW Contact:    Liliana Cline, LCSW Phone Number: 04/29/2023, 1:05 PM  Clinical Narrative:                 According to the notes, patient was recently discharged to [facility] for STR. Notified by MD that patient is ready to discharge back to facility. CSW reached out to [facility] - inquired if patient will be able to return and if she will need new therapy evals/insurance authorization. Awaiting a response from [facility].  1:27- Notified MD that per Tammy at [facility], patient was there for STR only. Patient will need a new PT eval and insurance authorization in order to return. TOC will continue to follow.  Expected Discharge Plan: Skilled Nursing Facility Barriers to Discharge: Continued Medical Work up   Patient Goals and CMS Choice            Expected Discharge Plan and Services                                              Prior Living Arrangements/Services                       Activities of Daily Living   ADL Screening (condition at time of admission) Independently performs ADLs?: No Does the patient have a NEW difficulty with bathing/dressing/toileting/self-feeding that is expected to last >3 days?: Yes (Initiates electronic notice to provider for possible OT consult) Does the patient have a NEW difficulty with getting in/out of bed, walking, or climbing stairs that is expected to last >3 days?: Yes (Initiates electronic notice to provider for possible PT consult) Does the patient have a NEW difficulty with communication that is expected to last >3 days?: No Is the patient deaf or have difficulty hearing?: No Does the patient have difficulty seeing, even when wearing glasses/contacts?: No Does the patient have difficulty concentrating, remembering, or making  decisions?: No  Permission Sought/Granted                  Emotional Assessment              Admission diagnosis:  Acute respiratory failure with hypoxia (HCC) [J96.01] ESRD on hemodialysis (HCC) [N18.6, Z99.2] Syncope, unspecified syncope type [R55] Acute hypoxemic respiratory failure (HCC) [J96.01] Patient Active Problem List   Diagnosis Date Noted   Acute hypoxemic respiratory failure (HCC) 04/28/2023   Streptococcal bacteremia 04/12/2023   Pneumonia 04/07/2023   Dysphagia 04/07/2023   UTI (urinary tract infection) 04/07/2023   Enlarged salivary gland 04/06/2023   Severe protein-calorie malnutrition (HCC) 04/06/2023   Weight loss, non-intentional 04/06/2023   Encounter for annual wellness visit (AWV) in Medicare patient 02/02/2023   Memory change 01/28/2022   Primary osteoarthritis of both knees 01/27/2022   ABLA (acute blood loss anemia) 11/27/2021   GI bleed 11/26/2021   Chronic diastolic CHF (congestive heart failure) (HCC) 07/11/2020   Incarcerated left inguinal hernia 07/11/2020   SBO (small bowel obstruction) (HCC)    Anemia due to stage 4 chronic kidney disease (HCC) 08/27/2019   ESRD (end stage renal disease) (HCC)    Anemia in chronic kidney disease 01/23/2019   Secondary hyperparathyroidism of  renal origin (HCC) 01/23/2019   Acute respiratory failure with hypoxia (HCC) 04/09/2017   Syncopal episodes 12/15/2015   Anemia of chronic disease 04/14/2015   Anxiety 04/14/2015   Clinical depression 04/14/2015   Essential hypertension 04/14/2015   Arthritis, degenerative 04/14/2015   Allergic rhinitis 04/14/2015   Hyperlipidemia 02/27/2015   Glaucoma 12/23/2014   Type 2 diabetes mellitus without complication, without long-term current use of insulin (HCC) 12/23/2014   GERD (gastroesophageal reflux disease) 12/23/2014   PCP:  [name], MD Pharmacy:   [facility] [address], [place] - [address] [place]  [address] Phone: [phone] Fax: [phone]     Social Drivers of Health (SDOH) Social History: SDOH Screenings   Food Insecurity: No Food Insecurity (04/28/2023)  Housing: Low Risk  (04/28/2023)  Transportation Needs: No Transportation Needs (04/28/2023)  Utilities: Not At Risk (04/28/2023)  Alcohol Screen: Low Risk  (02/02/2023)  Depression (PHQ2-9): Low Risk  (04/06/2023)  Financial Resource Strain: Low Risk  (11/01/2022)  Physical Activity: Inactive (11/01/2022)  Social Connections: Socially Isolated (11/01/2022)  Stress: No Stress Concern Present (11/01/2022)  Tobacco Use: Medium Risk (04/28/2023)   SDOH Interventions:     Readmission Risk Interventions    04/09/2023    2:34 PM  Readmission Risk Prevention Plan  Transportation Screening Complete  PCP or Specialist Appt within 5-7 Days Complete  Home Care Screening Complete  Medication Review (RN CM) Complete

## 2023-04-29 NOTE — Plan of Care (Signed)
  Problem: Education: Goal: Ability to describe self-care measures that may prevent or decrease complications (Diabetes Survival Skills Education) will improve Outcome: Progressing Goal: Individualized Educational Video(s) Outcome: Progressing   

## 2023-04-29 NOTE — Progress Notes (Signed)
SLP Cancellation Note  Patient Details Name: Lori Stanley MRN: [id number] DOB: Sep 16, 1931   Cancelled treatment:       Reason Eval/Treat Not Completed: Patient at procedure or test/unavailable   Maia Breslow 04/29/2023, 11:20 AM

## 2023-04-29 NOTE — Progress Notes (Signed)
noted nonintact area of sacrum 0.4x0.2x0.4cm dermal tissue no drainage no s/s of infection. Foam dressing applied. MD Sumayya notified.

## 2023-04-30 DIAGNOSIS — R7881 Bacteremia: Secondary | ICD-10-CM | POA: Diagnosis not present

## 2023-04-30 DIAGNOSIS — Z515 Encounter for palliative care: Secondary | ICD-10-CM

## 2023-04-30 DIAGNOSIS — R55 Syncope and collapse: Secondary | ICD-10-CM | POA: Diagnosis not present

## 2023-04-30 DIAGNOSIS — N186 End stage renal disease: Secondary | ICD-10-CM | POA: Diagnosis not present

## 2023-04-30 DIAGNOSIS — J9601 Acute respiratory failure with hypoxia: Secondary | ICD-10-CM | POA: Diagnosis not present

## 2023-04-30 DIAGNOSIS — E785 Hyperlipidemia, unspecified: Secondary | ICD-10-CM

## 2023-04-30 LAB — GLUCOSE, CAPILLARY
Glucose-Capillary: 81 mg/dL (ref 70–99)
Glucose-Capillary: 63 mg/dL — ABNORMAL LOW (ref 70–99)
Glucose-Capillary: 110 mg/dL — ABNORMAL HIGH (ref 70–99)
Glucose-Capillary: 102 mg/dL — ABNORMAL HIGH (ref 70–99)
Glucose-Capillary: 64 mg/dL — ABNORMAL LOW (ref 70–99)
Glucose-Capillary: 99 mg/dL (ref 70–99)

## 2023-04-30 MED ORDER — CEFAZOLIN SODIUM-DEXTROSE 2-4 GM/100ML-% IV SOLN
2.0000 g | INTRAVENOUS | Status: DC
  Administered 2023-05-01: 2 g via INTRAVENOUS
  Filled 2023-04-30 (×2): qty 100

## 2023-04-30 MED ORDER — NEPRO/CARBSTEADY PO LIQD
237.0000 mL | Freq: Two times a day (BID) | ORAL | Status: DC
  Administered 2023-04-30 – 2023-05-02 (×4): 237 mL via ORAL

## 2023-04-30 MED ORDER — PROSOURCE PLUS PO LIQD
30.0000 mL | Freq: Two times a day (BID) | ORAL | Status: DC
  Administered 2023-04-30 – 2023-05-02 (×3): 30 mL via ORAL
  Filled 2023-04-30 (×3): qty 30

## 2023-04-30 NOTE — NC FL2 (Signed)
[place] MEDICAID FL2 LEVEL OF CARE FORM     IDENTIFICATION  Patient Name: Lori Stanley Birthdate: Dec 25, 1931 Sex: female Admission Date (Current Location): 04/28/2023  Ambulatory [facility] and [place] Number:  Chiropodist and Address:  [facility], [address], [place], [place] [address]      Provider Number: [id number]  Attending Physician Name and Address:  Arnetha Courser, MD  Relative Name and Phone Number:  Stanley,Lori (Niece)  [phone] [facility])    Current Level of Care: Hospital Recommended Level of Care: Skilled Nursing Facility Prior Approval Number:    Date Approved/Denied:   PASRR Number: [id number]  Discharge Plan:      Current Diagnoses: Patient Active Problem List   Diagnosis Date Noted   Acute hypoxemic respiratory failure (HCC) 04/28/2023   Streptococcal bacteremia 04/12/2023   Pneumonia 04/07/2023   Dysphagia 04/07/2023   UTI (urinary tract infection) 04/07/2023   Enlarged salivary gland 04/06/2023   Severe protein-calorie malnutrition (HCC) 04/06/2023   Weight loss, non-intentional 04/06/2023   Encounter for annual wellness visit (AWV) in Medicare patient 02/02/2023   Memory change 01/28/2022   Primary osteoarthritis of both knees 01/27/2022   ABLA (acute blood loss anemia) 11/27/2021   GI bleed 11/26/2021   Chronic diastolic CHF (congestive heart failure) (HCC) 07/11/2020   Incarcerated left inguinal hernia 07/11/2020   SBO (small bowel obstruction) (HCC)    Anemia due to stage 4 chronic kidney disease (HCC) 08/27/2019   ESRD (end stage renal disease) (HCC)    Anemia in chronic kidney disease 01/23/2019   Secondary hyperparathyroidism of renal origin (HCC) 01/23/2019   Acute respiratory failure with hypoxia (HCC) 04/09/2017   Syncopal episodes 12/15/2015   Anemia of chronic disease 04/14/2015   Anxiety 04/14/2015   Clinical depression 04/14/2015   Essential hypertension 04/14/2015   Arthritis,  degenerative 04/14/2015   Allergic rhinitis 04/14/2015   Hyperlipidemia 02/27/2015   Glaucoma 12/23/2014   Type 2 diabetes mellitus without complication, without long-term current use of insulin (HCC) 12/23/2014   GERD (gastroesophageal reflux disease) 12/23/2014    Orientation RESPIRATION BLADDER Height & Weight     Self, Time, Situation, Place  O2 Continent (oliguria) Weight: 83 lb 1.8 oz (37.7 kg) Height:  5\' 3"  (160 cm)  BEHAVIORAL SYMPTOMS/MOOD NEUROLOGICAL BOWEL NUTRITION STATUS      Incontinent Diet (DIET DYS 2 Room service appropriate? Yes; Fluid consistency: Thin; Fluid restriction: 1200 mL Fluid)  AMBULATORY STATUS COMMUNICATION OF NEEDS Skin   Limited Assist Verbally Other (Comment) (sacrum)                       Personal Care Assistance Level of Assistance  Bathing, Feeding, Dressing Bathing Assistance: Limited assistance Feeding assistance: Limited assistance Dressing Assistance: Limited assistance     Functional Limitations Info             SPECIAL CARE FACTORS FREQUENCY  PT (By licensed PT), OT (By licensed OT)     PT Frequency: 5 times per week OT Frequency: 5 times per week            Contractures      Additional Factors Info  Code Status, Allergies Code Status Info: full Allergies Info: Antihistamines, Chlorpheniramine-type, Other, Paxil (Paroxetine), Codeine           Current Medications (04/30/2023):  This is the current hospital active medication list Current Facility-Administered Medications  Medication Dose Route Frequency Provider Last Rate Last Admin   (feeding supplement) PROSource  Plus liquid 30 mL  30 mL Oral BID BM Arnetha Courser, MD   30 mL at 04/30/23 1409   acetaminophen (TYLENOL) tablet 650 mg  650 mg Oral Q6H PRN Cox, Amy N, DO       Or   acetaminophen (TYLENOL) suppository 650 mg  650 mg Rectal Q6H PRN Cox, Amy N, DO       albuterol (PROVENTIL) (2.5 MG/3ML) 0.083% nebulizer solution 2.5 mg  2.5 mg Inhalation Q6H PRN  Cox, Amy N, DO       ALPRAZolam (XANAX) tablet 0.5 mg  0.5 mg Oral TID PRN Cox, Amy N, DO       carvedilol (COREG) tablet 6.25 mg  6.25 mg Oral BID Cox, Amy N, DO   6.25 mg at 04/30/23 0910   ceFAZolin (ANCEF) IVPB 2g/100 mL premix  2 g Intravenous Q dialysis [facility] [place], [facility]   Stopped at 04/29/23 1335   Chlorhexidine Gluconate Cloth 2 % PADS 6 each  6 each Topical Q0600 Wendee Beavers, NP   6 each at 04/30/23 0603   dextrose 50 % solution 25 mL  25 mL Intravenous PRN Cox, Amy N, DO   25 mL at 04/29/23 0839   feeding supplement (NEPRO CARB STEADY) liquid 237 mL  237 mL Oral BID BM Arnetha Courser, MD   237 mL at 04/30/23 1409   heparin injection 5,000 Units  5,000 Units Subcutaneous Q8H Cox, Amy N, DO   5,000 Units at 04/30/23 1409   hydrALAZINE (APRESOLINE) injection 5 mg  5 mg Intravenous Q6H PRN Cox, Amy N, DO       insulin aspart (novoLOG) injection 0-5 Units  0-5 Units Subcutaneous QHS Cox, Amy N, DO       insulin aspart (novoLOG) injection 0-6 Units  0-6 Units Subcutaneous TID WC Cox, Amy N, DO       multivitamin (RENA-VIT) tablet 1 tablet  1 tablet Oral Daily Cox, Amy N, DO   1 tablet at 04/30/23 0909   ondansetron (ZOFRAN) tablet 4 mg  4 mg Oral Q6H PRN Cox, Amy N, DO       Or   ondansetron (ZOFRAN) injection 4 mg  4 mg Intravenous Q6H PRN Cox, Amy N, DO       pantoprazole (PROTONIX) EC tablet 80 mg  80 mg Oral Daily Cox, Amy N, DO   80 mg at 04/30/23 0909   senna-docusate (Senokot-S) tablet 1 tablet  1 tablet Oral QHS PRN Cox, Amy N, DO       traZODone (DESYREL) tablet 150 mg  150 mg Oral QHS PRN Cox, Amy N, DO         Discharge Medications: Please see discharge summary for a list of discharge medications.  Relevant Imaging Results:  Relevant Lab Results:   Additional Information Lori Stanley Lori Stanley MWF. SS #: [id number]  Lori Stanley Lori Yovana Scogin, LCSW

## 2023-04-30 NOTE — Progress Notes (Signed)
[facility]  PROGRESS NOTE   Subjective:   Seen at bedside.  Family is in attendance.  Objective:  Vital signs: Blood pressure 127/64, pulse 70, temperature 97.7 F (36.5 C), temperature source Axillary, resp. rate 17, height 5\' 3"  (1.6 m), weight 37.7 kg, SpO2 100%.  Intake/Output Summary (Last 24 hours) at 04/30/2023 1427 Last data filed at 04/30/2023 0315 Gross per 24 hour  Intake 100 ml  Output --  Net 100 ml   Filed Weights   04/28/23 1832 04/29/23 0848 04/29/23 1350  Weight: 44.1 kg 40.3 kg 37.7 kg     Physical Exam: General:  No acute distress  Head:  Normocephalic, atraumatic. Moist oral mucosal membranes  Eyes:  Anicteric  Neck:  Supple  Lungs:   Clear to auscultation, normal effort  Heart:  S1S2 no rubs  Abdomen:   Soft, nontender, bowel sounds present  Extremities:  peripheral edema.  Neurologic:  Awake, alert, following commands  Skin:  No lesions  Access:     Basic Metabolic Panel: Recent Labs  Lab 04/28/23 1243 04/29/23 0638  NA 138 139  K 3.3* 3.4*  CL 100 101  CO2 25 27  GLUCOSE 85 64*  BUN 26* 29*  CREATININE 3.90* 4.41*  CALCIUM 8.4* 8.6*   GFR: Estimated Creatinine Clearance: 5.1 mL/min (A) (by C-G formula based on SCr of 4.41 mg/dL (H)).  Liver Function Tests: Recent Labs  Lab 04/28/23 1243  AST 17  ALT <5  ALKPHOS 99  BILITOT 0.5  PROT 5.8*  ALBUMIN 2.6*   No results for input(s): "LIPASE", "AMYLASE" in the last 168 hours. No results for input(s): "AMMONIA" in the last 168 hours.  CBC: Recent Labs  Lab 04/28/23 1243 04/29/23 0638  WBC 7.5 6.3  NEUTROABS 6.2  --   HGB 11.5* 11.3*  HCT 38.6 37.5  MCV 93.9 91.2  PLT 275 322     HbA1C: Hemoglobin A1C  Date/Time Value Ref Range Status  12/10/2020 11:37 AM 5.6 4.0 - 5.6 % Final  05/13/2013 03:44 AM 8.5 (H) 4.2 - 6.3 % Final    Comment:    The American Diabetes Association recommends that a primary goal of therapy should be <7% and that physicians  should reevaluate the treatment regimen in patients with HbA1c values consistently >8%.    Hgb A1c MFr Bld  Date/Time Value Ref Range Status  09/21/2021 11:14 AM 5.0 4.8 - 5.6 % Final    Comment:             Prediabetes: 5.7 - 6.4          Diabetes: >6.4          Glycemic control for adults with diabetes: <7.0   07/12/2020 08:40 AM 6.4 (H) 4.8 - 5.6 % Final    Comment:    (NOTE) Pre diabetes:          5.7%-6.4%  Diabetes:              >6.4%  Glycemic control for   <7.0% adults with diabetes     Urinalysis: No results for input(s): "COLORURINE", "LABSPEC", "PHURINE", "GLUCOSEU", "HGBUR", "BILIRUBINUR", "KETONESUR", "PROTEINUR", "UROBILINOGEN", "NITRITE", "LEUKOCYTESUR" in the last 72 hours.  Invalid input(s): "APPERANCEUR"    Imaging: No results found.   Medications:     ceFAZolin (ANCEF) IV Stopped (04/29/23 1335)    (feeding supplement) PROSource Plus  30 mL Oral BID BM   carvedilol  6.25 mg Oral BID   Chlorhexidine Gluconate Cloth  6  each Topical Q0600   feeding supplement (NEPRO CARB STEADY)  237 mL Oral BID BM   heparin  5,000 Units Subcutaneous Q8H   insulin aspart  0-5 Units Subcutaneous QHS   insulin aspart  0-6 Units Subcutaneous TID WC   multivitamin  1 tablet Oral Daily   pantoprazole  80 mg Oral Daily    Assessment/ Plan:     Ms. MARIADEL RHINEHART is a 89 y.o.  female with a past medical history of depression/anxiety/GERD/hypertension/CHF/end-stage renal disease on dialysis on Monday Wednesday Friday schedule now admitted with emergency room with concern for syncope and decreased oxygen saturation. Recent history of strep bacteremia for which she has been on cefazolin during dialysis to be continued until 05/05/2023.   Principal Problem:   Acute hypoxemic respiratory failure (HCC) Active Problems:   Type 2 diabetes mellitus without complication, without long-term current use of insulin (HCC)   GERD (gastroesophageal reflux disease)    Hyperlipidemia   Anxiety   Essential hypertension   Syncopal episodes   Anemia in chronic kidney disease   ESRD (end stage renal disease) (HCC)   Memory change   Severe protein-calorie malnutrition (HCC)   Streptococcal bacteremia  #1: End-stage renal disease: Patient had dialysis yesterday.  Did not tolerate fluid removal.  #2: Anemia: Anemia secondary to chronic kidney disease.  Continue anemia protocol.  #3: Secondary hyperparathyroidism: Will monitor PTH, calcium and phosphorus levels.  #4: History of Streptococcus anginosus bacteremia: Continue cefazolin as ordered.  #5: Diabetes: Continue insulin protocol.  #6: Hypertension: Patient has been on carvedilol.  Continue as ordered.  #7: Respiratory failure: Initially on BiPAP presently on nasal cannula oxygen.  Labs and medications reviewed. Will continue to follow along with you.   LOS: 2 Lorain Childes, MD [facility] 12/29/20242:27 PM

## 2023-04-30 NOTE — Plan of Care (Signed)

## 2023-04-30 NOTE — Evaluation (Signed)
Occupational Therapy Evaluation Patient Details Name: Lori Stanley MRN: [id number] DOB: [birth year] Today's Date: 04/30/2023   History of Present Illness Pt is an 89 y/o F admitted on 04/28/23 after presenting with c/o syncope & O2 desaturation. Pt is being treated for acute hypoxemic respiratory failure, streptococcal bacteremia. PMH: ESRD on HD MWF, depression, anxiety, GERD, heart failure preserved EF   Clinical Impression   Chart reviewed to date, pt greeted in chair, agreeable to OT evaluation. PTA pt was at rehab, hoping to return to rehab, required assist for ADL/IADL. Pt presents with deficits in strength, activity tolerance, balance, endurance affecting safe and optimal ADL completion. MIN A required for STS, CGA-MIN A amb in room, stand at sink for approx 5 minutes to complete grooming tasks with CGA-MIN A. Pt is left in chair, all needs met. Pt will benefit from acute OT to address functional deficits and to facilitate optimal ADL performance.   Pre mobility sitting in chair: BP: 111/54 Post mobility sitting in chair: BP 123/62 (MAP 79) Pt on 2 L via [place] throughout, no reports of SOB      If plan is discharge home, recommend the following: A little help with walking and/or transfers;A little help with bathing/dressing/bathroom;Help with stairs or ramp for entrance;Assist for transportation;Assistance with cooking/housework    Functional Status Assessment  Patient has had a recent decline in their functional status and demonstrates the ability to make significant improvements in function in a reasonable and predictable amount of time.  Equipment Recommendations  BSC/3in1    Recommendations for Other Services       Precautions / Restrictions Precautions Precautions: Fall Precaution Comments: L UE fistula Restrictions Weight Bearing Restrictions Per Provider Order: No      Mobility Bed Mobility               General bed mobility comments: NT in recliner  pre/post session    Transfers Overall transfer level: Needs assistance Equipment used: Rolling walker (2 wheels) Transfers: Sit to/from Stand Sit to Stand: Min assist                  Balance Overall balance assessment: Needs assistance Sitting-balance support: No upper extremity supported, Feet supported Sitting balance-Leahy Scale: Good     Standing balance support: During functional activity, No upper extremity supported Standing balance-Leahy Scale: Poor                             ADL either performed or assessed with clinical judgement   ADL Overall ADL's : Needs assistance/impaired Eating/Feeding: Set up;Sitting   Grooming: Wash/dry hands;Oral care;Standing;Contact guard assist;Minimal assistance Grooming Details (indicate cue type and reason): standing approx 5 minutes at sink, slight posterior lean             Lower Body Dressing: Contact guard assist   Toilet Transfer: Contact guard assist;Rolling walker (2 wheels);Ambulation Toilet Transfer Details (indicate cue type and reason): simulated to bedside chair from sink         Functional mobility during ADLs: Contact guard assist;Minimal assistance;Rolling walker (2 wheels) (approx 10' in room two attempts)       Vision Patient Visual Report: No change from baseline Additional Comments: reports she had Cataractssurgery     Perception         Praxis         Pertinent Vitals/Pain Pain Assessment Pain Assessment: No/denies pain     Extremity/Trunk Assessment Upper Extremity Assessment  Upper Extremity Assessment: Generalized weakness   Lower Extremity Assessment Lower Extremity Assessment: Generalized weakness   Cervical / Trunk Assessment Cervical / Trunk Assessment: Kyphotic   Communication Communication Communication: Difficulty communicating thoughts/reduced clarity of speech Cueing Techniques: Verbal cues   Cognition Arousal: Alert Behavior During Therapy: WFL for  tasks assessed/performed Overall Cognitive Status: No family/caregiver present to determine baseline cognitive functioning Area of Impairment: Problem solving                             Problem Solving: Requires verbal cues       General Comments  Pt on 2L/min via nasal cannula. BP in RUE at beginning of session sitting in recliner, 111/54 mmHg MAP 72, sitting in recliner at end of session: 123/62 mmHg MAP 79    Exercises Other Exercises Other Exercises: edu re: role of OT, role of rehab   Shoulder Instructions      Home Living Family/patient expects to be discharged to:: Skilled nursing facility (anticipates return to STR) Living Arrangements: Alone Available Help at Discharge: Family;Available PRN/intermittently Type of Home: House Home Access: Ramped entrance     Home Layout: One level     Bathroom Shower/Tub: Sponge bathes at baseline   Bathroom Toilet: Handicapped height     Home Equipment: Agricultural consultant (2 wheels);BSC/3in1          Prior Functioning/Environment               Mobility Comments: MOD I with RW for indoor/outdoor mobility ADLs Comments: MOD I with ADL/simple ADLS prior to prevoius admission; family assists with IADLs; assist for ADL since at rehab        OT Problem List:        OT Treatment/Interventions: Self-care/ADL training;Therapeutic exercise;Therapeutic activities;DME and/or AE instruction;Patient/family education;Balance training    OT Goals(Current goals can be found in the care plan section) Acute Rehab OT Goals Patient Stated Goal: return to rehab OT Goal Formulation: With patient Time For Goal Achievement: 05/14/23 Potential to Achieve Goals: Good ADL Goals Pt Will Perform Grooming: with supervision;sitting;standing Pt Will Perform Lower Body Dressing: with supervision;sit to/from stand;sitting/lateral leans Pt Will Transfer to Toilet: with supervision;ambulating Pt Will Perform Toileting - Clothing  Manipulation and hygiene: with supervision;sitting/lateral leans;sit to/from stand  OT Frequency: Min 1X/week    Co-evaluation PT/OT/SLP Co-Evaluation/Treatment: Yes Reason for Co-Treatment: For patient/therapist safety PT goals addressed during session: Mobility/safety with mobility;Proper use of DME;Balance        AM-PAC OT "6 Clicks" Daily Activity     Outcome Measure Help from another person eating meals?: None Help from another person taking care of personal grooming?: A Little Help from another person toileting, which includes using toliet, bedpan, or urinal?: A Little Help from another person bathing (including washing, rinsing, drying)?: A Lot Help from another person to put on and taking off regular upper body clothing?: A Little Help from another person to put on and taking off regular lower body clothing?: A Lot 6 Click Score: 17   End of Session Equipment Utilized During Treatment: Rolling walker (2 wheels);Oxygen Nurse Communication: Mobility status  Activity Tolerance: Patient tolerated treatment well Patient left: in chair;with call bell/phone within reach;with family/visitor present  OT Visit Diagnosis: Other abnormalities of gait and mobility (R26.89)                Time: 1610-9604 OT Time Calculation (min): 24 min Charges:  OT General Charges $OT Visit: 1  Visit OT Evaluation $OT Eval Low Complexity: 1 Low Oleta Mouse, OTD OTR/L  04/30/23, 11:16 AM

## 2023-04-30 NOTE — Evaluation (Signed)
Physical Therapy Evaluation Patient Details Name: Lori Stanley MRN: [id number] DOB: 01-Jul-1931 Today's Date: 04/30/2023  History of Present Illness  Pt is an 89 y/o F admitted on 04/28/23 after presenting with c/o syncope & O2 desaturation. Pt is being treated for acute hypoxemic respiratory failure, streptococcal bacteremia. PMH: ESRD on HD MWF, depression, anxiety, GERD, heart failure preserved EF  Clinical Impression  Pt seen for PT evaluation with pt agreeable to tx. On this date, pt presents with generalized weakness/deconditioning & impaired balance. Pt requires min assist for STS, CGA for short distance gait in room with RW. Pt does tolerate standing at sink ~5 minutes to perform grooming tasks with CGA. Pt would benefit from ongoing skilled PT treatment to address strengthening, balance, endurance, to reduce fall risk & increase independence with mobility.        If plan is discharge home, recommend the following: A little help with walking and/or transfers;A little help with bathing/dressing/bathroom;Assistance with cooking/housework;Assist for transportation;Help with stairs or ramp for entrance   Can travel by private vehicle   Yes    Equipment Recommendations Other (comment) (defer to next venue)  Recommendations for Other Services       Functional Status Assessment Patient has had a recent decline in their functional status and demonstrates the ability to make significant improvements in function in a reasonable and predictable amount of time.     Precautions / Restrictions Precautions Precautions: Fall Precaution Comments: L UE fistula Restrictions Weight Bearing Restrictions Per Provider Order: No      Mobility  Bed Mobility               General bed mobility comments: not tested, pt received & left sitting in recliner    Transfers Overall transfer level: Needs assistance Equipment used: Rolling walker (2 wheels) Transfers: Sit to/from Stand Sit  to Stand: Min assist           General transfer comment: STS from recliner    Ambulation/Gait Ambulation/Gait assistance: Contact guard assist Gait Distance (Feet): 15 Feet (+ 15 ft) Assistive device: Rolling walker (2 wheels) Gait Pattern/deviations: Decreased step length - left, Decreased step length - right, Decreased stride length Gait velocity: decreased        Stairs            Wheelchair Mobility     Tilt Bed    Modified Rankin (Stroke Patients Only)       Balance Overall balance assessment: Needs assistance   Sitting balance-Leahy Scale: Good   Postural control: Posterior lean Standing balance support: During functional activity, No upper extremity supported Standing balance-Leahy Scale: Poor Standing balance comment: slight posterior lean when standing at sink to perform grooming tasks                             Pertinent Vitals/Pain Pain Assessment Pain Assessment: No/denies pain    Home Living Family/patient expects to be discharged to:: Skilled nursing facility (anticipates returning to Short term rehab) Living Arrangements: Alone Available Help at Discharge: Family;Available PRN/intermittently Type of Home: House Home Access: Ramped entrance       Home Layout: One level Home Equipment: Agricultural consultant (2 wheels);BSC/3in1      Prior Function               Mobility Comments: MOD I with RW for indoor/outdoor mobility       Extremity/Trunk Assessment   Upper Extremity Assessment Upper Extremity Assessment:  Generalized weakness    Lower Extremity Assessment Lower Extremity Assessment: Generalized weakness    Cervical / Trunk Assessment Cervical / Trunk Assessment: Kyphotic (rounded shoulders, forward head)  Communication   Communication Communication: Difficulty communicating thoughts/reduced clarity of speech  Cognition Arousal: Alert Behavior During Therapy: WFL for tasks assessed/performed Overall  Cognitive Status: Within Functional Limits for tasks assessed                                 General Comments: Pleasant, follows commands throughout session        General Comments General comments (skin integrity, edema, etc.): Pt on 2L/min via nasal cannula. BP in RUE at beginning of session sitting in recliner, 111/54 mmHg MAP 72, sitting in recliner at end of session: 123/62 mmHg MAP 79    Exercises     Assessment/Plan    PT Assessment Patient needs continued PT services  PT Problem List Decreased strength;Cardiopulmonary status limiting activity;Decreased activity tolerance;Decreased knowledge of use of DME;Decreased balance;Decreased mobility       PT Treatment Interventions Balance training;Modalities;DME instruction;Gait training;Neuromuscular re-education;Stair training;Functional mobility training;Therapeutic exercise;Manual techniques;Therapeutic activities;Patient/family education    PT Goals (Current goals can be found in the Care Plan section)  Acute Rehab PT Goals Patient Stated Goal: none stated PT Goal Formulation: With patient Time For Goal Achievement: 05/14/23 Potential to Achieve Goals: Fair    Frequency Min 1X/week     Co-evaluation PT/OT/SLP Co-Evaluation/Treatment: Yes Reason for Co-Treatment: Other (comment);For patient/therapist safety (2/2 hx of low BP, syncope) PT goals addressed during session: Mobility/safety with mobility;Proper use of DME;Balance         AM-PAC PT "6 Clicks" Mobility  Outcome Measure Help needed turning from your back to your side while in a flat bed without using bedrails?: A Little Help needed moving from lying on your back to sitting on the side of a flat bed without using bedrails?: A Little Help needed moving to and from a bed to a chair (including a wheelchair)?: A Little Help needed standing up from a chair using your arms (e.g., wheelchair or bedside chair)?: A Little Help needed to walk in hospital  room?: A Little Help needed climbing 3-5 steps with a railing? : A Lot 6 Click Score: 17    End of Session Equipment Utilized During Treatment: Oxygen Activity Tolerance: Patient tolerated treatment well Patient left: in chair;with family/visitor present;with call bell/phone within reach (OT in room) Nurse Communication: Mobility status PT Visit Diagnosis: Muscle weakness (generalized) (M62.81);Difficulty in walking, not elsewhere classified (R26.2);Unsteadiness on feet (R26.81)    Time: 1610-9604 PT Time Calculation (min) (ACUTE ONLY): 18 min   Charges:   PT Evaluation $PT Eval Moderate Complexity: 1 Mod   PT General Charges $$ ACUTE PT VISIT: 1 Visit         Aleda Grana, PT, DPT 04/30/23, 10:57 AM   Sandi Mariscal 04/30/2023, 10:56 AM

## 2023-04-30 NOTE — Progress Notes (Signed)
Progress Note   Patient: Lori Stanley ZOX:[id number] DOB: [birth year] DOA: 04/28/2023     2 DOS: the patient was seen and examined on 04/30/2023   Brief hospital course: Ms. Patricia Prusha is a 89 year old female with history of end-stage renal disease on hemodialysis MWF, depression, anxiety, GERD, heart failure preserved ejection fraction, who presents emergency department for chief concerns of syncope, and O2 desaturation.  Initially requiring BiPAP, likely due to fluid overload with change in dialysis schedule due to holidays.  History of recent Streptococcus anginosus bacteremia for which she is on IV cefazolin with dialysis until 05/05/2023.  Vitals in the ED showed temperature 99, respiration rate of 20, heart rate 69, blood pressure 124/61, SpO2 96% on 4 L nasal cannula.  Serum sodium is 138, potassium 3.3, chloride 100, bicarb 25, BUN 26, serum creatinine 3.90, EGFR of 11, nonfasting blood glucose 85, WBC 7.5, hemoglobin 11.5, platelets of 275.  BNP was elevated at 224.4.  High sensitive troponin was 58.  COVID/influenza A/influenza B/RSV PCR were negative.  AST, ALT, alk phos, T. bili were within normal limits.  ED treatment: None.  EDP consulted nephrology who states the patient will get dialysis tomorrow, 04/29/2023.  12/28: Vital and labs stable, currently on 3 L of oxygen, with baseline of 2 L use.  Hypoglycemia overnight requiring intervention, poor p.o. intake.  Received dialysis today. Will need another PT and OT evaluation and insurance authorization before returning to her facility.  12/29; hemodynamically stable, back to baseline oxygen requirement of 2 L.  PT is recommending SNF.  Patient is extremely frail and malnourished.  Currently full code.  Based on underlying comorbidities and being on dialysis, with history of multiple syncopal episode with dialysis makes her increased risk for mortality. Palliative care was also consulted to discuss goals of  care  Assessment and Plan: * Acute hypoxemic respiratory failure (HCC) Presumed secondary to fluid overload in setting of dialysis schedule change during holiday season Initially required BiPAP but later transition to [place], now saturating well on 3 L of oxygen, uses 2 L at baseline -Continue supplemental oxygen-wean as tolerated back to baseline   Streptococcal bacteremia History of Streptococcus anginosus bacteremia  Cefazolin 2 g IV daily on Monday Wednesday Friday (hemodialysis days) with projected end date on 05/05/2023 was resumed on admission with pharmacy consultation noting holiday change in her dialysis outpatient  ESRD (end stage renal disease) [facility]) Patient received dialysis yesterday. Will resume her normal schedule from Monday  Essential hypertension Home carvedilol 6.25 mg p.o. twice daily resumed on admission Hydralazine 5 mg IV every 6 hours as needed for SBP > 175, 5 days ordered  Type 2 diabetes mellitus without complication, without long-term current use of insulin (HCC) Overnight some hypoglycemia requiring intervention Insulin SSI with at bedtime coverage ordered, renal on HD dosing D50 25 mL, IV as needed for low blood sugar, 5 days ordered Goal inpatient blood glucose levels 140-180  Anemia in chronic kidney disease At baseline, patient range over the last 2 weeks has been 10.5-12.6  Anxiety Home alprazolam 0.5 mg p.o. 3 times daily as needed for anxiety resumed on admission  Severe protein-calorie malnutrition [facility]) Registered dietitian has been consulted  Syncopal episodes Patient apparently quite sensitive during dialysis with history of multiple short acting syncopal episodes with removal of extra fluid Fall precautions ordered    Subjective: Patient was working with PT when seen today.  No new concern.  Physical Exam: Vitals:   04/29/23 1506 04/29/23 2032 04/30/23 0425  04/30/23 0700  BP: (!) 142/55 130/63 126/67 127/64  Pulse: 93 92 86 70   Resp: 19 17 17 17   Temp: 97.7 F (36.5 C) 97.9 F (36.6 C) 97.9 F (36.6 C) 97.7 F (36.5 C)  TempSrc:    Axillary  SpO2: 98% 100% 100% 100%  Weight:      Height:       General.  Frail and cachectic lady, in no acute distress. Pulmonary.  Lungs clear bilaterally, normal respiratory effort. CV.  Regular rate and rhythm, no JVD, rub or murmur. Abdomen.  Soft, nontender, nondistended, BS positive. CNS.  Alert and oriented .  No focal neurologic deficit. Extremities.  No edema, no cyanosis, pulses intact and symmetrical .   Data Reviewed: Prior data reviewed  Family Communication: Tried calling niece, as soon as I introduced myself she hung up.  Disposition: Status is: Inpatient Remains inpatient appropriate because: Severity of illness  Planned Discharge Destination: Skilled nursing facility  DVT prophylaxis.  Subcu heparin Time spent: 45 minutes  This record has been created using Conservation officer, historic buildings. Errors have been sought and corrected,but may not always be located. Such creation errors do not reflect on the standard of care.   Author: Arnetha Courser, MD 04/30/2023 2:54 PM  For on call review [email or web address].

## 2023-04-30 NOTE — Consult Note (Signed)
Consultation Note Date: 04/30/2023   Patient Name: Lori Stanley  DOB: [birth year]  MRN: [id number]  Age / Sex: 89 y.o., female  PCP: [name], MD Referring Physician: Arnetha Courser, MD  Reason for Consultation: Establishing goals of care   HPI/Brief Hospital Course: 89 y.o. female  with past medical history of ESRD on HD MWF, HFpEF, chronic respiratory failure on 2L [place] at baseline, depression and anxiety admitted from [facility] on 04/28/2023 with hypoxia, shortness of breath and syncope. According to facility staff, HD scheduled rearranged due to holiday schedule, while working with PT Ms. Gargan had a syncopal episode.  In ED found to have hypoxia, requiring increased supplemental oxygen, felt to be in fluid overload likely secondary to underlying ESRD and rearranged HD schedule  Noted recent admission 12/6-12/17 for UTI and streptococcal bacteremia, remains on antibiotic therapy, followed by ID  Palliative medicine was consulted for assisting with goals of care conversations.  Subjective:  Extensive chart review has been completed prior to meeting patient including labs, vital signs, imaging, progress notes, orders, and available advanced directive documents from current and previous encounters.  Visited with [name] at her bedside. She is awake, alert, oriented but remains unclear on events leading to hospitalization. She can recall that she thinks she "passed out" before EMS was called.  Introduced myself as a Publishing rights manager as a member of the palliative care team. Explained palliative medicine is specialized medical care for people living with serious illness. It focuses on providing relief from the symptoms and stress of a serious illness. The goal is to improve quality of life for both the patient and the family.   Assisted [name] with her breakfast. She is able to feed herself, needs assistance with  set up.  [name] shares she is widowed and her only child passed away a few years ago. She has a niece Talbert Forest) that lives next door that checks in on her daily and a sister that accompanies her to her doctor appointments. Her niece Solon Palm is also involved in caring for her. At her baseline, she is able to ambulate with a walker and is independent in completing her ADL's.  We discussed patient's current illness and what it means in the larger context of patient's on-going co-morbidities.   [name] shares she has not completed advanced directives in the past but wishes to appoint her niece, Solon Palm as Runner, broadcasting/film/video.  We discussed code status and the difference between Full Code and Do Not Resuscitate. Encouraged [name] to consider DNR/DNI status understanding evidenced based poor outcomes in similar hospitalized patients, as the cause of the arrest is likely associated with chronic/terminal disease rather than a reversible acute cardio-pulmonary event.  At this time, [name] is clear that she wishes to remain Full Code and wishes to continue pursuing dialysis.  Called and spoke with [facility], plans to attempt visit at bedside later this afternoon. Unable to visit with [facility], plan for PMT provider to follow up early next week. Recommend completion of AD if possible.  I discussed importance of continued conversations with family/support persons and all members of their medical team regarding overall plan of care and treatment options ensuring decisions are in alignment with patients goals of care.  All questions/concerns addressed. Emotional support provided to patient/family/support persons. PMT will continue to follow and support patient as needed.  Objective: Primary Diagnoses: Present on Admission:  Acute hypoxemic respiratory failure (HCC)  Syncopal episodes  Severe protein-calorie malnutrition (HCC)  Hyperlipidemia  Essential hypertension  GERD  (gastroesophageal reflux disease)  ESRD (end stage renal disease) (HCC)  Anxiety  Anemia in chronic kidney disease  Streptococcal bacteremia  Memory change   Physical Exam Constitutional:      General: She is not in acute distress.    Appearance: She is not ill-appearing.  Pulmonary:     Effort: Pulmonary effort is normal. No respiratory distress.  Skin:    General: Skin is warm and dry.  Neurological:     Mental Status: She is alert and oriented to person, place, and time.     Motor: Weakness present.     Vital Signs: BP 127/64 (BP Location: Right Arm)   Pulse 70   Temp 97.7 F (36.5 C) (Axillary)   Resp 17   Ht 5\' 3"  (1.6 m)   Wt 37.7 kg   SpO2 100%   BMI 14.72 kg/m  Pain Scale: 0-10   Pain Score: 0-No pain  IO: Intake/output summary:  Intake/Output Summary (Last 24 hours) at 04/30/2023 1012 Last data filed at 04/30/2023 0315 Gross per 24 hour  Intake 100 ml  Output 1 ml  Net 99 ml    LBM: Last BM Date : 04/29/23 Baseline Weight: Weight: 40 kg Most recent weight: Weight: 37.7 kg       Assessment and Plan  SUMMARY OF RECOMMENDATIONS   Ongoing GOC discussions PMT to continue to follow for ongoing needs and support  Palliative Prophylaxis:   Bowel Regimen, Delirium Protocol and Frequent Pain Assessment  Discussed With: Nursing staff   Thank you for this consult and allowing Palliative Medicine to participate in the care of [name]. Palliative medicine will continue to follow and assist as needed.   Time Total: 75 minutes  Time spent includes: Detailed review of medical records (labs, imaging, vital signs), medically appropriate exam (mental status, respiratory, cardiac, skin), discussed with treatment team, counseling and educating patient, family and staff, documenting clinical information, medication management and coordination of care.   Signed by: Leeanne Deed, DNP, AGNP-C Palliative Medicine    Please contact Palliative  Medicine Team phone at [phone] for questions and concerns.  For individual provider: See Loretha Stapler

## 2023-04-30 NOTE — Assessment & Plan Note (Signed)
Patient received dialysis yesterday. Will resume her normal schedule from Monday

## 2023-04-30 NOTE — Evaluation (Signed)
Clinical/Bedside Swallow Evaluation Patient Details  Name: Lori Stanley MRN: [id number] Date of Birth: 05/23/1931  Today's Date: 04/30/2023 Time: SLP Start Time (ACUTE ONLY): 1006 SLP Stop Time (ACUTE ONLY): 1020 SLP Time Calculation (min) (ACUTE ONLY): 14 min  Past Medical History:  Past Medical History:  Diagnosis Date   Acute heart failure (HCC)    Acute on chronic heart failure with preserved ejection fraction (HFpEF) (HCC) 06/05/2019   Anemia    Anemia due to stage 4 chronic kidney disease (HCC) 08/27/2019   Anxiety    CHF (congestive heart failure) (HCC)    Chronic kidney disease    stage V; end stage renal disease   Chronic kidney disease, stage 5 (HCC) 01/23/2019   Chronic low back pain    Depression    Diabetes mellitus without complication (HCC)    Essential hypertension, malignant 05/24/2013   GERD (gastroesophageal reflux disease)    Hematoma 07/17/2020   Hyperlipidemia    Hypertension    MI (myocardial infarction) (HCC)    Obesity    Proteinuria 01/23/2019   Respiratory failure (HCC) 04/09/2017   SOB (shortness of breath) 05/24/2013   Unilateral inguinal hernia with obstruction and without gangrene    Volume overload 08/30/2019   Past Surgical History:  Past Surgical History:  Procedure Laterality Date   A/V FISTULAGRAM Left 01/15/2020   Procedure: A/V FISTULAGRAM;  Surgeon: Renford Dills, MD;  Location: [facility] INVASIVE CV LAB;  Service: Cardiovascular;  Laterality: Left;   ABDOMINAL HYSTERECTOMY     APPENDECTOMY     AV FISTULA PLACEMENT Left 11/13/2019   Procedure: INSERTION OF ARTERIOVENOUS (AV) GORE-TEX GRAFT ARM ( BRACHIAL AXILLARY );  Surgeon: Renford Dills, MD;  Location: [facility] ORS;  Service: Vascular;  Laterality: Left;   DIALYSIS/PERMA CATHETER INSERTION N/A 06/07/2019   Procedure: DIALYSIS/PERMA CATHETER INSERTION;  Surgeon: Renford Dills, MD;  Location: [facility] INVASIVE CV [facility];  Service: Cardiovascular;  Laterality: N/A;   DIALYSIS/PERMA CATHETER  REMOVAL N/A 02/11/2020   Procedure: DIALYSIS/PERMA CATHETER REMOVAL;  Surgeon: Renford Dills, MD;  Location: [facility] CV [facility];  Service: Cardiovascular;  Laterality: N/A;   ESOPHAGOGASTRODUODENOSCOPY (EGD) WITH PROPOFOL N/A 11/30/2021   Procedure: ESOPHAGOGASTRODUODENOSCOPY (EGD) WITH PROPOFOL;  Surgeon: Toney Reil, MD;  Location: [facility] ENDOSCOPY;  Service: Gastroenterology;  Laterality: N/A;   EYE SURGERY     HERNIA REPAIR     INCISION AND DRAINAGE PERIRECTAL ABSCESS     INGUINAL HERNIA REPAIR N/A 07/12/2020   Procedure: HERNIA REPAIR INGUINAL ADULT;  Surgeon: Henrene Dodge, MD;  Location: [facility] ORS;  Service: General;  Laterality: N/A;   KNEE SURGERY     THYROID SURGERY     VAGINAL HYSTERECTOMY     HPI:  89 y.o. female with PMHx of HFpEF, ESRD on hemodialysis Monday Wednesday Friday, GERD, hypertension presenting with failure to thrive, pneumonia, gastritis, Cholelithiasis and Colonic diverticulosis and UTI. CT chest, 04/28/23, "1. No embolism to the proximal subsegmental pulmonary artery level. 2. No lung mass, consolidation, pleural effusion or pneumothorax. 3. Partial fusion of C7-T1 and T8-T9 vertebral bodies, new since theprior study. There is endplate irregularity at T8-T9 levels. Nosurrounding fat stranding. Findings are most likely degenerative.However, correlate clinically to determine the need for additional imaging with MRI to exclude discitis osteomyelitis."   Assessment / Plan / Recommendation  Clinical Impression  Pt seen for clinical swallowing evaluation. Pt initially lethargic, but LOA improved over course of evaluation. On 3L/min O2 via [place]. Oral deficits today appear c/w previous swallowing  evaluation earlier this month. Pt with prolonged/inefficient mastication of solids likely due to dental status. Pharyngeal swallow appeared [facility] per clinical assessment. No complaints of globus or regurgitation which appears to resolved from previous admission. Recommend a  Dysphagia 2 Diet with Thin Liquids with standard aspiration precuations. A softer diet is recommended for ease of chewing. Suspect pt is near swallowing baseline. SLP to sign off as pt has no acute SLP needs at this time. SLP Visit Diagnosis: Dysphagia, oral phase (R13.11)    Aspiration Risk  Risk for inadequate nutrition/hydration;Mild aspiration risk    Diet Recommendation Dysphagia 2 (Fine chop);Thin liquid    Liquid Administration via: Cup;Straw Medication Administration:  (as tolerated) Supervision: Patient able to self feed;Intermittent supervision to cue for compensatory strategies Compensations: Minimize environmental distractions;Slow rate;Small sips/bites Postural Changes: Seated upright at 90 degrees;Remain upright for at least 30 minutes after po intake    Other  Recommendations Oral Care Recommendations: Oral care BID    Recommendations for follow up therapy are one component of a multi-disciplinary discharge planning process, led by the attending physician.  Recommendations may be updated based on patient status, additional functional criteria and insurance authorization.  Follow up Recommendations No SLP follow up         Functional Status Assessment Patient has not had a recent decline in their functional status    Swallow Study   General Date of Onset: 04/28/23 HPI: 89 y.o. female with PMHx of HFpEF, ESRD on hemodialysis Monday Wednesday Friday, GERD, hypertension presenting with failure to thrive, pneumonia, gastritis, Cholelithiasis and Colonic diverticulosis and UTI. Type of Study: Bedside Swallow Evaluation Previous Swallow Assessment: CSE 04/13/23 Dys 2 and Thin; ENT work up last admission noted "laryngeal edema" Diet Prior to this Study: Regular;Mildly thick liquids (Level 2, nectar thick) Temperature Spikes Noted: No Respiratory Status: Nasal cannula History of Recent Intubation: No Behavior/Cognition: Alert;Cooperative;Pleasant mood Oral Cavity Assessment:  Within Functional Limits Oral Care Completed by SLP: Recent completion by staff Oral Cavity - Dentition: Poor condition;Missing dentition Vision: Functional for self-feeding Self-Feeding Abilities: Needs assist;Needs set up Patient Positioning: Upright in chair Baseline Vocal Quality: Low vocal intensity;Hoarse Volitional Cough: Strong Volitional Swallow: Able to elicit    Oral/Motor/Sensory Function Overall Oral Motor/Sensory Function: Within functional limits   Ice Chips Ice chips: Not tested   Thin Liquid Thin Liquid: Within functional limits Presentation: Cup;Straw;Self Fed    Nectar Thick Nectar Thick Liquid: Not tested   Honey Thick Honey Thick Liquid: Not tested   Puree Puree: Within functional limits Presentation: Self Fed;Spoon   Solid     Solid: Impaired Presentation: Self Fed Oral Phase Impairments: Impaired mastication Oral Phase Functional Implications: Impaired mastication Pharyngeal Phase Impairments:  (WFL)     Clyde Canterbury, M.S., CCC-SLP Speech-Language Pathologist [facility] [phone] (ASCOM)  Alessandra Bevels Robyn Nohr 04/30/2023,10:31 AM

## 2023-04-30 NOTE — Progress Notes (Signed)
Initial Nutrition Assessment  DOCUMENTATION CODES:   Severe malnutrition in context of chronic illness, Underweight  INTERVENTION:  Continue with regular diet Continue with Nepro Shake po BID, each supplement provides 425 kcal and 19 grams protein Continue with multivitamin Prosource 30ml BID     NUTRITION DIAGNOSIS:   Severe Malnutrition related to chronic illness as evidenced by estimated needs, meal completion < 50%, percent weight loss (BMI 14.72).    GOAL:   Patient will meet greater than or equal to 90% of their needs    MONITOR:   PO intake, Supplement acceptance  REASON FOR ASSESSMENT:   Consult Enteral/tube feeding initiation and management  ASSESSMENT:  89 y.o. F,  presented to ED with chief concerns of syncope, O2 desaturation, admitted with Acute hypoxemic respiratory failure. PMH: end-stage renal disease on hemodialysis MWF, depression, anxiety, GERD, heart failure preserved ejection fractio, CHF, HTN. Reached out to pt with no answer in room. All information obtained through EMR and Team;    EMR and Team revealed:  30% of breakfast consumed and ~ 75 % of supplement drank. Independent feeding ability with no current chewing or swallowing concerns noted at this time.  She has had issues with dysphagia in the past. Review of weight history reveals 28% weight loss in approximately 2 mos.  Patient lives at home by self with niece and nephew dropping by on occasion to provide meals. No living children.  NFPE results last hospital stay (04/11/23): Orbital and buccal regions noted to have moderate fat depletion. Upper arm and thoracic and lumbar region severe fat depletion. Temple, patellar, anterior thigh, and posterior calf region noted to have moderate muscle depletion.  Clavicle , clavicle and acromion , scapular bone regions noted to have severe muscle depletion.   With NFPE findings from less than a month ago, 28% weight loss since October and weight non  significant decline since last hospitalization, BMI 14.72, and suspected not meeting 50% of energy needs pt meets criteria for severe malnutrition.   Hospital weight history:  04/29/23 1350 37.7 kg 83.11 lbs  04/29/23 0848 40.3 kg 88.85 lbs  04/28/23 18:32:35 44.1 kg 97.22 lbs  04/28/23 1134 40 kg 88.18 lbs   Weight history: 04/29/23 37.7 kg  04/21/23 39 kg  04/17/23 39 kg  04/06/23 36.5 kg  02/02/23 52.6 kg  11/01/22 49.9 kg  06/09/22 50.1 kg  05/10/22 47.2 kg      Average Meal Intake: 30% x 1 meal  Nutritionally Relevant Medications: Scheduled Meds:  carvedilol  6.25 mg Oral BID   feeding supplement (NEPRO CARB STEADY)  237 mL Oral BID BM   multivitamin  1 tablet Oral Daily   pantoprazole  80 mg Oral Daily    PRN Meds:.acetaminophen **OR** acetaminophen, albuterol, ALPRAZolam, ceFAZolin (ANCEF) IV, dextrose, hydrALAZINE, ondansetron **OR** ondansetron (ZOFRAN) IV, senna-docusate, traZODone  Labs Reviewed  NUTRITION - FOCUSED PHYSICAL EXAM:  Deferred   Diet Order:   Diet Order             DIET DYS 2 Room service appropriate? Yes; Fluid consistency: Thin; Fluid restriction: 1200 mL Fluid  Diet effective now                   EDUCATION NEEDS:   Not appropriate for education at this time  Skin:  Skin Assessment: Reviewed RN Assessment  Last BM:  04/29/23  Height:   Ht Readings from Last 1 Encounters:  04/28/23 5\' 3"  (1.6 m)    Weight:   Wt  Readings from Last 1 Encounters:  04/29/23 37.7 kg    Ideal Body Weight:     BMI:  Body mass index is 14.72 kg/m.  Estimated Nutritional Needs:   Kcal:  1570-1850 kcal  Protein:  70-80 g  Fluid:  37ml/kcal    Jamelle Haring RDN, LDN Clinical Dietitian   If unable to reach, please contact "[facility] Inpatient" secure chat group between 8 am-4 pm daily"

## 2023-04-30 NOTE — TOC Progression Note (Addendum)
Transition of Care [facility]) - Progression Note    Patient Details  Name: Lori Stanley MRN: [id number] Date of Birth: 1931/07/17  Transition of Care [facility]) CM/SW Contact  Liliana Cline, LCSW Phone Number: 04/30/2023, 3:46 PM  Clinical Narrative:    Went to start auth in [place] Portal for patient to return to [employer] for [employer] Patient has an [place] currently approved through 12/31. CSW asked Tammy at [facility] if a new auth needs to be started, awaiting response.  4:33- Tammy states Berkley Harvey does need to be restarted. CSW restarted auth in [facility] Portal.  Expected Discharge Plan: Skilled Nursing Facility Barriers to Discharge: Continued Medical Work up  Expected Discharge Plan and Services                                               Social Determinants of Health (SDOH) Interventions SDOH Screenings   Food Insecurity: No Food Insecurity (04/28/2023)  Housing: Low Risk  (04/28/2023)  Transportation Needs: No Transportation Needs (04/28/2023)  Utilities: Not At Risk (04/28/2023)  Alcohol Screen: Low Risk  (02/02/2023)  Depression (PHQ2-9): Low Risk  (04/06/2023)  Financial Resource Strain: Low Risk  (11/01/2022)  Physical Activity: Inactive (11/01/2022)  Social Connections: Socially Isolated (11/01/2022)  Stress: No Stress Concern Present (11/01/2022)  Tobacco Use: Medium Risk (04/28/2023)    Readmission Risk Interventions    04/09/2023    2:34 PM  Readmission Risk Prevention Plan  Transportation Screening Complete  PCP or Specialist Appt within 5-7 Days Complete  Home Care Screening Complete  Medication Review (RN CM) Complete

## 2023-05-01 DIAGNOSIS — R55 Syncope and collapse: Secondary | ICD-10-CM | POA: Diagnosis not present

## 2023-05-01 DIAGNOSIS — Z7189 Other specified counseling: Secondary | ICD-10-CM

## 2023-05-01 DIAGNOSIS — R7881 Bacteremia: Secondary | ICD-10-CM | POA: Diagnosis not present

## 2023-05-01 DIAGNOSIS — J9601 Acute respiratory failure with hypoxia: Secondary | ICD-10-CM | POA: Diagnosis not present

## 2023-05-01 DIAGNOSIS — N186 End stage renal disease: Secondary | ICD-10-CM | POA: Diagnosis not present

## 2023-05-01 LAB — RENAL FUNCTION PANEL
Albumin: 2.6 g/dL — ABNORMAL LOW (ref 3.5–5.0)
Anion gap: 13 (ref 5–15)
BUN: 20 mg/dL (ref 8–23)
CO2: 26 mmol/L (ref 22–32)
Calcium: 8.6 mg/dL — ABNORMAL LOW (ref 8.9–10.3)
Chloride: 93 mmol/L — ABNORMAL LOW (ref 98–111)
Creatinine, Ser: 3.95 mg/dL — ABNORMAL HIGH (ref 0.44–1.00)
GFR, Estimated: 10 mL/min — ABNORMAL LOW (ref >60)
Glucose, Bld: 102 mg/dL — ABNORMAL HIGH (ref 70–99)
Phosphorus: 3.5 mg/dL (ref 2.5–4.6)
Potassium: 3.5 mmol/L (ref 3.5–5.1)
Sodium: 132 mmol/L — ABNORMAL LOW (ref 135–145)

## 2023-05-01 LAB — GLUCOSE, CAPILLARY
Glucose-Capillary: 60 mg/dL — ABNORMAL LOW (ref 70–99)
Glucose-Capillary: 67 mg/dL — ABNORMAL LOW (ref 70–99)
Glucose-Capillary: 92 mg/dL (ref 70–99)
Glucose-Capillary: 82 mg/dL (ref 70–99)
Glucose-Capillary: 88 mg/dL (ref 70–99)

## 2023-05-01 LAB — CBC
HCT: 36.1 % (ref 36.0–46.0)
Hemoglobin: 11.1 g/dL — ABNORMAL LOW (ref 12.0–15.0)
MCH: 28 pg (ref 26.0–34.0)
MCHC: 30.7 g/dL (ref 30.0–36.0)
MCV: 91.2 fL (ref 80.0–100.0)
Platelets: 307 10*3/uL (ref 150–400)
RBC: 3.96 MIL/uL (ref 3.87–5.11)
RDW: 14.6 % (ref 11.5–15.5)
WBC: 5.6 10*3/uL (ref 4.0–10.5)
nRBC: 0 % (ref 0.0–0.2)

## 2023-05-01 LAB — HEMOGLOBIN A1C
Hgb A1c MFr Bld: 5.5 % (ref 4.8–5.6)
Mean Plasma Glucose: 111 mg/dL

## 2023-05-01 MED ORDER — BRIMONIDINE TARTRATE 0.2 % OP SOLN
1.0000 [drp] | Freq: Two times a day (BID) | OPHTHALMIC | Status: DC
  Administered 2023-05-01 – 2023-05-02 (×2): 1 [drp] via OPHTHALMIC
  Filled 2023-05-01: qty 5

## 2023-05-01 MED ORDER — LIDOCAINE-PRILOCAINE 2.5-2.5 % EX CREA
1.0000 | TOPICAL_CREAM | CUTANEOUS | Status: DC | PRN
  Filled 2023-05-01: qty 5

## 2023-05-01 MED ORDER — HEPARIN SODIUM (PORCINE) 1000 UNIT/ML DIALYSIS: 1000.0000 [IU] | INTRAMUSCULAR | Status: DC | PRN

## 2023-05-01 MED ORDER — SERTRALINE HCL 50 MG PO TABS
25.0000 mg | ORAL_TABLET | Freq: Every day | ORAL | Status: DC
  Administered 2023-05-01 – 2023-05-02 (×2): 25 mg via ORAL
  Filled 2023-05-01 (×3): qty 1

## 2023-05-01 MED ORDER — PENTAFLUOROPROP-TETRAFLUOROETH EX AERO
1.0000 | INHALATION_SPRAY | CUTANEOUS | Status: DC | PRN
  Filled 2023-05-01: qty 30

## 2023-05-01 NOTE — Progress Notes (Signed)
Hemodialysis note  Received patient in bed to unit. Alert and oriented.  Informed consent signed and in chart.  Treatment initiated: 1243 Treatment completed: 1606  Patient tolerated well. Transported back to room, alert without acute distress.  Report given to patient's RN.   Access used: LUA AVG Access issues: none  Total UF removed: 0 Medication(s) given:  Ancef 2G IV  Post HD weight: 39.1   [facility]

## 2023-05-01 NOTE — Assessment & Plan Note (Signed)
Presumed secondary to fluid overload in setting of dialysis schedule change during holiday season Initially required BiPAP but later transition to [place], now weaned back to baseline of 2 L -Continue supplemental oxygen-

## 2023-05-01 NOTE — Care Management Important Message (Signed)
Important Message  Patient Details  Name: Lori Stanley MRN: [id number] Date of Birth: 1931-07-22   Important Message Given:  No     Cristela Blue, CMA 05/01/2023, 3:31 PM

## 2023-05-01 NOTE — Progress Notes (Signed)
12/30 pt out of room and was not able to receive IM letter

## 2023-05-01 NOTE — Consult Note (Signed)
WOC Nurse Consult Note: Reason for Consult:sacrum open area  Wound type: 1. Stage 2 Pressure Injury lower sacrum/upper Coccyx  2. Full thickness skin loss to L elbow r/t likely trauma Pressure Injury POA: Yes Measurement: 1.  Stage 2 0.5 cm x 0.5 cm upper coccyx area with erythema down length of coccyx  2.Full l thickness skin loss L elbow 1 cm x 1 cm x 0.1 cm  Wound bed: both 100% pink and moist  Drainage (amount, consistency, odor) minimal serosanguinous  Periwound: mild erythema to sacrum/coccyx  Dressing procedure/placement/frequency:  Cleanse L arm full thickness wound with NS, apply silicone foam, lift foam daily to assess area, change foam q3 days and prn soiling. Clean buttocks/sacrum/coccyx with soap and water, dry and apply floor stock purple top skin protectant daily and prn soiling.  Cover with silicone foam. Lift foam daily to assess area, change foam q3 days and prn soiling. Re-consult WOC if area develops any necrotic (brown/yellow/tan/black) tissue.   POC discussed with patient and bedside nurse WOC team will not follow. Re-consult if further needs arise.   Thank you,    Priscella Mann MSN, RN-BC, [employer] [phone]

## 2023-05-01 NOTE — Progress Notes (Signed)
Progress Note   Patient: Lori Stanley ZHY:[id number] DOB: 10/07/1931 DOA: 04/28/2023     3 DOS: the patient was seen and examined on 05/01/2023   Brief hospital course: Ms. Roni Bakowski is a 89 year old female with history of end-stage renal disease on hemodialysis MWF, depression, anxiety, GERD, heart failure preserved ejection fraction, who presents emergency department for chief concerns of syncope, and O2 desaturation.  Initially requiring BiPAP, likely due to fluid overload with change in dialysis schedule due to holidays.  History of recent Streptococcus anginosus bacteremia for which she is on IV cefazolin with dialysis until 05/05/2023.  Vitals in the ED showed temperature 99, respiration rate of 20, heart rate 69, blood pressure 124/61, SpO2 96% on 4 L nasal cannula.  Serum sodium is 138, potassium 3.3, chloride 100, bicarb 25, BUN 26, serum creatinine 3.90, EGFR of 11, nonfasting blood glucose 85, WBC 7.5, hemoglobin 11.5, platelets of 275.  BNP was elevated at 224.4.  High sensitive troponin was 58.  COVID/influenza A/influenza B/RSV PCR were negative.  AST, ALT, alk phos, T. bili were within normal limits.  ED treatment: None.  EDP consulted nephrology who states the patient will get dialysis tomorrow, 04/29/2023.  12/28: Vital and labs stable, currently on 3 L of oxygen, with baseline of 2 L use.  Hypoglycemia overnight requiring intervention, poor p.o. intake.  Received dialysis today. Will need another PT and OT evaluation and insurance authorization before returning to her facility.  12/29; hemodynamically stable, back to baseline oxygen requirement of 2 L.  PT is recommending SNF.  Patient is extremely frail and malnourished.  Currently full code.  Based on underlying comorbidities and being on dialysis, with history of multiple syncopal episode with dialysis makes her increased risk for mortality. Palliative care was also consulted to discuss goals of care. Pending  insurance authorization to go back to her facility  Assessment and Plan: * Acute hypoxemic respiratory failure (HCC) Presumed secondary to fluid overload in setting of dialysis schedule change during holiday season Initially required BiPAP but later transition to [place], now weaned back to baseline of 2 L -Continue supplemental oxygen-  Streptococcal bacteremia History of Streptococcus anginosus bacteremia  Cefazolin 2 g IV daily on Monday Wednesday Friday (hemodialysis days) with projected end date on 05/05/2023 was resumed on admission with pharmacy consultation noting holiday change in her dialysis outpatient  ESRD (end stage renal disease) [facility]) Patient received dialysis yesterday. Will resume her normal schedule from Monday  Essential hypertension Home carvedilol 6.25 mg p.o. twice daily resumed on admission Hydralazine 5 mg IV every 6 hours as needed for SBP > 175, 5 days ordered  Type 2 diabetes mellitus without complication, without long-term current use of insulin (HCC) Overnight some hypoglycemia requiring intervention Insulin SSI with at bedtime coverage ordered, renal on HD dosing D50 25 mL, IV as needed for low blood sugar, 5 days ordered Goal inpatient blood glucose levels 140-180  Anemia in chronic kidney disease At baseline, patient range over the last 2 weeks has been 10.5-12.6  Anxiety Home alprazolam 0.5 mg p.o. 3 times daily as needed for anxiety resumed on admission  Severe protein-calorie malnutrition [facility]) Registered dietitian has been consulted  Syncopal episodes Patient apparently quite sensitive during dialysis with history of multiple short acting syncopal episodes with removal of extra fluid Fall precautions ordered    Subjective: Patient was seen and examined today.  No new concern.  She was back to baseline oxygen requirement of 2 L.  Physical Exam: Vitals:  05/01/23 1213 05/01/23 1243 05/01/23 1300 05/01/23 1330  BP: 138/64 137/63 (!) 178/69  133/69  Pulse: 75 68 89 81  Resp: (!) 25 (!) 24 18 (!) 21  Temp: 98.6 F (37 C)     TempSrc: Oral     SpO2: 100% 100% 91% 100%  Weight: 39.1 kg     Height:       General.  Frail and malnourished elderly lady, in no acute distress. Pulmonary.  Lungs clear bilaterally, normal respiratory effort. CV.  Regular rate and rhythm, no JVD, rub or murmur. Abdomen.  Soft, nontender, nondistended, BS positive. CNS.  Alert and oriented .  No focal neurologic deficit. Extremities.  No edema, no cyanosis, pulses intact and symmetrical.     Data Reviewed: Prior data reviewed  Family Communication: Discussed with niece on phone  Disposition: Status is: Inpatient Remains inpatient appropriate because: Severity of illness  Planned Discharge Destination: Skilled nursing facility  DVT prophylaxis.  Subcu heparin Time spent: 44 minutes  This record has been created using Conservation officer, historic buildings. Errors have been sought and corrected,but may not always be located. Such creation errors do not reflect on the standard of care.   Author: Arnetha Courser, MD 05/01/2023 1:34 PM  For on call review [email or web address].

## 2023-05-01 NOTE — Progress Notes (Signed)
Daily Progress Note   Patient Name: Lori Stanley       Date: 05/01/2023 DOB: 1932/05/01  Age: 89 y.o. MRN#: [id number] Attending Physician: Arnetha Courser, MD Primary Care Physician: [name], MD Admit Date: 04/28/2023  Reason for Consultation/Follow-up: Establishing goals of care  Subjective: Notes and labs reviewed. Patient sitting in bed. She denies complaint at this time. Upon broaching GOC she states this was all discussed yesterday, and confirms full code/ full scope. She is aware HPOA papers can be completed while in the hospital, but advises she is not ready to complete them.  If patient would like to complete AD forms, please place consult to spiritual care to facilitate.   Length of Stay: 3  Current Medications: Scheduled Meds:   (feeding supplement) PROSource Plus  30 mL Oral BID BM   carvedilol  6.25 mg Oral BID   feeding supplement (NEPRO CARB STEADY)  237 mL Oral BID BM   heparin  5,000 Units Subcutaneous Q8H   insulin aspart  0-5 Units Subcutaneous QHS   insulin aspart  0-6 Units Subcutaneous TID WC   multivitamin  1 tablet Oral Daily   pantoprazole  80 mg Oral Daily    Continuous Infusions:   ceFAZolin (ANCEF) IV      PRN Meds: acetaminophen **OR** acetaminophen, albuterol, ALPRAZolam, dextrose, hydrALAZINE, ondansetron **OR** ondansetron (ZOFRAN) IV, senna-docusate, traZODone  Physical Exam Pulmonary:     Effort: Pulmonary effort is normal.  Neurological:     Mental Status: She is alert.             Vital Signs: BP (!) 142/58 (BP Location: Right Arm)   Pulse 65   Temp 97.7 F (36.5 C)   Resp 19   Ht 5\' 3"  (1.6 m)   Wt 37.7 kg   SpO2 100%   BMI 14.72 kg/m  SpO2: SpO2: 100 % O2 Device: O2 Device: Nasal Cannula O2 Flow Rate: O2  Flow Rate (L/min): 2 L/min  Intake/output summary: No intake or output data in the 24 hours ending 05/01/23 1108 LBM: Last BM Date : 05/01/23 Baseline Weight: Weight: 40 kg Most recent weight: Weight: 37.7 kg        Patient Active Problem List   Diagnosis Date Noted   Acute hypoxemic respiratory failure (HCC) 04/28/2023   Streptococcal bacteremia 04/12/2023  Pneumonia 04/07/2023   Dysphagia 04/07/2023   UTI (urinary tract infection) 04/07/2023   Enlarged salivary gland 04/06/2023   Severe protein-calorie malnutrition (HCC) 04/06/2023   Weight loss, non-intentional 04/06/2023   Encounter for annual wellness visit (AWV) in Medicare patient 02/02/2023   Memory change 01/28/2022   Primary osteoarthritis of both knees 01/27/2022   ABLA (acute blood loss anemia) 11/27/2021   GI bleed 11/26/2021   Chronic diastolic CHF (congestive heart failure) (HCC) 07/11/2020   Incarcerated left inguinal hernia 07/11/2020   SBO (small bowel obstruction) (HCC)    Anemia due to stage 4 chronic kidney disease (HCC) 08/27/2019   ESRD (end stage renal disease) (HCC)    Anemia in chronic kidney disease 01/23/2019   Secondary hyperparathyroidism of renal origin (HCC) 01/23/2019   Acute respiratory failure with hypoxia (HCC) 04/09/2017   Syncopal episodes 12/15/2015   Anemia of chronic disease 04/14/2015   Anxiety 04/14/2015   Clinical depression 04/14/2015   Essential hypertension 04/14/2015   Arthritis, degenerative 04/14/2015   Allergic rhinitis 04/14/2015   Hyperlipidemia 02/27/2015   Glaucoma 12/23/2014   Type 2 diabetes mellitus without complication, without long-term current use of insulin (HCC) 12/23/2014   GERD (gastroesophageal reflux disease) 12/23/2014    Palliative Care Assessment & Plan     Recommendations/Plan: Patient confirms full code/ full scope. Patient does not wish to complete AD at this time. Please consult spiritual care to facilitate completion if patient would like to  complete them prior to discharge.  Code Status:    Code Status Orders  (From admission, onward)           Start     Ordered   04/28/23 1426  Full code  Continuous       Question:  By:  Answer:  Default: patient does not have capacity for decision making, no surrogate or prior directive available   04/28/23 1426           Code Status History     Date Active Date Inactive Code Status Order ID Comments User Context   04/07/2023 0821 04/18/2023 2122 Full Code [id number]  Floydene Flock, MD ED   11/26/2021 2108 11/30/2021 2326 Full Code [id number]  Synetta Fail, MD ED   07/17/2020 1203 07/20/2020 2343 Full Code [id number]  Donovan Kail, PA-C ED   07/11/2020 2304 07/14/2020 2235 Full Code [id number]  Anselm Jungling, DO ED   08/30/2019 1836 09/04/2019 0006 Full Code [id number]  Kathrynn Running, MD Inpatient   06/05/2019 0340 06/17/2019 2309 Full Code [id number]  Mansy, Vernetta Honey, MD ED   04/10/2017 0140 04/13/2017 2256 Full Code [id number]  Salary, Evelena Asa, MD Inpatient        Thank you for allowing the Palliative Medicine Team to assist in the care of this patient.   Morton Stall, NP  Please contact Palliative Medicine Team phone at [phone] for questions and concerns.

## 2023-05-01 NOTE — Progress Notes (Signed)
[facility]  ROUNDING NOTE   Subjective:   [name] was admitted to [facility] on 04/28/2023 for Acute respiratory failure with hypoxia (HCC) [J96.01] ESRD on hemodialysis (HCC) [N18.6, Z99.2] Syncope, unspecified syncope type [R55] Acute hypoxemic respiratory failure (HCC) [J96.01]   Update: Patient seen sitting up in bed Alert Partially completed breakfast tray at bedside Patient states she has difficulty swallowing and is unable to complete her breakfast Dialysis scheduled for later today 2 L nasal cannula  Objective:  Vital signs in last 24 hours:  Temp:  [97.4 F (36.3 C)-98.6 F (37 C)] 98.6 F (37 C) (12/30 1213) Pulse Rate:  [65-78] 65 (12/30 0818) Resp:  [19-20] 19 (12/30 0818) BP: (119-142)/(49-60) 142/58 (12/30 0818) SpO2:  [100 %] 100 % (12/30 0139) Weight:  [39.1 kg] 39.1 kg (12/30 1213)  Weight change:  Filed Weights   04/29/23 0848 04/29/23 1350 05/01/23 1213  Weight: 40.3 kg 37.7 kg 39.1 kg    Intake/Output: I/O last 3 completed shifts: In: 100 [IV Piggyback:100] Out: -    Intake/Output this shift:  No intake/output data recorded.  Physical Exam: General: NAD  Head: +dysphonic voice   Eyes: Anicteric  Lungs:  Clear to auscultation, normal effort  Heart: Regular rate and rhythm  Abdomen:  Soft, nontender  Extremities:  no peripheral edema.  Neurologic: Alert and oriented, moving all four extremities  Skin: No lesions  Access: Left AVF    Basic Metabolic Panel: Recent Labs  Lab 04/28/23 1243 04/29/23 0638  NA 138 139  K 3.3* 3.4*  CL 100 101  CO2 25 27  GLUCOSE 85 64*  BUN 26* 29*  CREATININE 3.90* 4.41*  CALCIUM 8.4* 8.6*    Liver Function Tests: Recent Labs  Lab 04/28/23 1243  AST 17  ALT <5  ALKPHOS 99  BILITOT 0.5  PROT 5.8*  ALBUMIN 2.6*   No results for input(s): "LIPASE", "AMYLASE" in the last 168 hours.  No results for input(s): "AMMONIA" in the last 168 hours.  CBC: Recent Labs  Lab  04/28/23 1243 04/29/23 0638  WBC 7.5 6.3  NEUTROABS 6.2  --   HGB 11.5* 11.3*  HCT 38.6 37.5  MCV 93.9 91.2  PLT 275 322    Cardiac Enzymes: No results for input(s): "CKTOTAL", "CKMB", "CKMBINDEX", "TROPONINI" in the last 168 hours.  BNP: Invalid input(s): "POCBNP"  CBG: Recent Labs  Lab 04/30/23 2138 04/30/23 2206 05/01/23 0817 05/01/23 0855 05/01/23 1128  GLUCAP 64* 99 60* 67* 92    Microbiology: Results for orders placed or performed during the hospital encounter of 04/28/23  Resp panel by RT-PCR (RSV, Flu A&B, Covid) Anterior Nasal Swab     Status: None   Collection Time: 04/28/23 11:45 AM   Specimen: Anterior Nasal Swab  Result Value Ref Range Status   SARS Coronavirus 2 by RT PCR NEGATIVE NEGATIVE Final    Comment: (NOTE) SARS-CoV-2 target nucleic acids are NOT DETECTED.  The SARS-CoV-2 RNA is generally detectable in upper respiratory specimens during the acute phase of infection. The lowest concentration of SARS-CoV-2 viral copies this assay can detect is 138 copies/mL. A negative result does not preclude SARS-Cov-2 infection and should not be used as the sole basis for treatment or other patient management decisions. A negative result may occur with  improper specimen collection/handling, submission of specimen other than nasopharyngeal swab, presence of viral mutation(s) within the areas targeted by this assay, and inadequate number of viral copies(<138 copies/mL). A negative result must be  combined with clinical observations, patient history, and epidemiological information. The expected result is Negative.  Fact Sheet for Patients:  BloggerCourse.com  Fact Sheet for Healthcare Providers:  SeriousBroker.it  This test is no t yet approved or cleared by the [place] FDA and  has been authorized for detection and/or diagnosis of SARS-CoV-2 by FDA under an Emergency Use Authorization (EUA). This EUA  will remain  in effect (meaning this test can be used) for the duration of the COVID-19 declaration under Section 564(b)(1) of the Act, 21 U.S.C.section 360bbb-3(b)(1), unless the authorization is terminated  or revoked sooner.       Influenza A by PCR NEGATIVE NEGATIVE Final   Influenza B by PCR NEGATIVE NEGATIVE Final    Comment: (NOTE) The Xpert Xpress SARS-CoV-2/FLU/RSV plus assay is intended as an aid in the diagnosis of influenza from Nasopharyngeal swab specimens and should not be used as a sole basis for treatment. Nasal washings and aspirates are unacceptable for Xpert Xpress SARS-CoV-2/FLU/RSV testing.  Fact Sheet for Patients: BloggerCourse.com  Fact Sheet for Healthcare Providers: SeriousBroker.it  This test is not yet approved or cleared by the [place] FDA and has been authorized for detection and/or diagnosis of SARS-CoV-2 by FDA under an Emergency Use Authorization (EUA). This EUA will remain in effect (meaning this test can be used) for the duration of the COVID-19 declaration under Section 564(b)(1) of the Act, 21 U.S.C. section 360bbb-3(b)(1), unless the authorization is terminated or revoked.     Resp Syncytial Virus by PCR NEGATIVE NEGATIVE Final    Comment: (NOTE) Fact Sheet for Patients: BloggerCourse.com  Fact Sheet for Healthcare Providers: SeriousBroker.it  This test is not yet approved or cleared by the [place] FDA and has been authorized for detection and/or diagnosis of SARS-CoV-2 by FDA under an Emergency Use Authorization (EUA). This EUA will remain in effect (meaning this test can be used) for the duration of the COVID-19 declaration under Section 564(b)(1) of the Act, 21 U.S.C. section 360bbb-3(b)(1), unless the authorization is terminated or revoked.  Performed at [facility], [address] [place], [place]  [address]     Coagulation Studies: No results for input(s): "LABPROT", "INR" in the last 72 hours.  Urinalysis: No results for input(s): "COLORURINE", "LABSPEC", "PHURINE", "GLUCOSEU", "HGBUR", "BILIRUBINUR", "KETONESUR", "PROTEINUR", "UROBILINOGEN", "NITRITE", "LEUKOCYTESUR" in the last 72 hours.  Invalid input(s): "APPERANCEUR"     Imaging: No results found.    Medications:     ceFAZolin (ANCEF) IV      (feeding supplement) PROSource Plus  30 mL Oral BID BM   carvedilol  6.25 mg Oral BID   feeding supplement (NEPRO CARB STEADY)  237 mL Oral BID BM   heparin  5,000 Units Subcutaneous Q8H   insulin aspart  0-5 Units Subcutaneous QHS   insulin aspart  0-6 Units Subcutaneous TID WC   multivitamin  1 tablet Oral Daily   pantoprazole  80 mg Oral Daily   acetaminophen **OR** acetaminophen, albuterol, ALPRAZolam, dextrose, heparin, hydrALAZINE, lidocaine-prilocaine, ondansetron **OR** ondansetron (ZOFRAN) IV, pentafluoroprop-tetrafluoroeth, senna-docusate, traZODone  Assessment/ Plan:  Ms. KARLEN UMBARGER is a 89 y.o.  female wth end stage renal disease on hemodialysis, hypertension, congestive heart failure, depression, GERD who presents to [facility] with 04/28/2023 with Acute respiratory failure with hypoxia (HCC) [J96.01] ESRD on hemodialysis (HCC) [N18.6, Z99.2] Syncope, unspecified syncope type [R55] Acute hypoxemic respiratory failure (HCC) [J96.01]  [facility] M[facility] Left AVF 43kg  End Stage Renal Disease on hemodialysis:   Last  treatment received on Saturday.  Patient scheduled to receive dialysis later today to maintain outpatient schedule.  UF 0 as patient appears euvolemic on exam.  Next treatment scheduled for Wednesday.  Hypertension with chronic kidney disease: Continue carvedilol.  Blood pressure 142/58  Anemia with chronic kidney disease: mircera as outpatient. Hemoglobin 11.3 on 04/29/2023.  No need for ESA's at this time.  Secondary Hyperparathyroidism:  currently holding phosphate binders. Gets parsabiv outpatient.  Will obtain updated labs with dialysis today.  5. Streptococcal bacteremia secondary to UTI.  Will continue cefazolin 2g/2g/2g with each dialysis per ID recommendations.    LOS: 3 Irish Piech 12/30/202412:18 PM

## 2023-05-01 NOTE — Plan of Care (Signed)

## 2023-05-01 NOTE — TOC Progression Note (Addendum)
Transition of Care [facility]) - Progression Note    Patient Details  Name: Lori Stanley MRN: [id number] Date of Birth: [birth year]  [facility]) CM/SW Contact  Margarito Liner, LCSW Phone Number: 05/01/2023, 8:24 AM  Clinical Narrative:  CSW started SNF insurance authorization.   4:44 pm: Auth approved but Berkley Harvey number has not generated yet. Reference # [id number]. Valid 12/30-1/2. Left message for [facility] admissions coordinator to see if they can take her today or if they need to wait until tomorrow.  Expected Discharge Plan: Skilled Nursing Facility Barriers to Discharge: Continued Medical Work up  Expected Discharge Plan and Services                                               Social Determinants of Health (SDOH) Interventions SDOH Screenings   Food Insecurity: No Food Insecurity (04/28/2023)  Housing: Low Risk  (04/28/2023)  Transportation Needs: No Transportation Needs (04/28/2023)  Utilities: Not At Risk (04/28/2023)  Alcohol Screen: Low Risk  (02/02/2023)  Depression (PHQ2-9): Low Risk  (04/06/2023)  Financial Resource Strain: Low Risk  (11/01/2022)  Physical Activity: Inactive (11/01/2022)  Social Connections: Socially Isolated (11/01/2022)  Stress: No Stress Concern Present (11/01/2022)  Tobacco Use: Medium Risk (04/28/2023)    Readmission Risk Interventions    04/09/2023    2:34 PM  Readmission Risk Prevention Plan  Transportation Screening Complete  PCP or Specialist Appt within 5-7 Days Complete  Home Care Screening Complete  Medication Review (RN CM) Complete

## 2023-05-02 ENCOUNTER — Inpatient Hospital Stay: Payer: 59 | Admitting: Infectious Diseases

## 2023-05-02 DIAGNOSIS — J9601 Acute respiratory failure with hypoxia: Secondary | ICD-10-CM | POA: Diagnosis not present

## 2023-05-02 DIAGNOSIS — M6259 Muscle wasting and atrophy, not elsewhere classified, multiple sites: Secondary | ICD-10-CM | POA: Diagnosis not present

## 2023-05-02 DIAGNOSIS — Z555 Less than a high school diploma: Secondary | ICD-10-CM | POA: Diagnosis not present

## 2023-05-02 DIAGNOSIS — E43 Unspecified severe protein-calorie malnutrition: Secondary | ICD-10-CM | POA: Diagnosis not present

## 2023-05-02 DIAGNOSIS — I10 Essential (primary) hypertension: Secondary | ICD-10-CM | POA: Diagnosis not present

## 2023-05-02 DIAGNOSIS — K219 Gastro-esophageal reflux disease without esophagitis: Secondary | ICD-10-CM | POA: Diagnosis not present

## 2023-05-02 DIAGNOSIS — I469 Cardiac arrest, cause unspecified: Secondary | ICD-10-CM | POA: Diagnosis not present

## 2023-05-02 DIAGNOSIS — F32A Depression, unspecified: Secondary | ICD-10-CM | POA: Diagnosis present

## 2023-05-02 DIAGNOSIS — N186 End stage renal disease: Secondary | ICD-10-CM | POA: Diagnosis not present

## 2023-05-02 DIAGNOSIS — R6889 Other general symptoms and signs: Secondary | ICD-10-CM | POA: Diagnosis not present

## 2023-05-02 DIAGNOSIS — G4700 Insomnia, unspecified: Secondary | ICD-10-CM | POA: Diagnosis not present

## 2023-05-02 DIAGNOSIS — R55 Syncope and collapse: Secondary | ICD-10-CM | POA: Diagnosis not present

## 2023-05-02 DIAGNOSIS — E785 Hyperlipidemia, unspecified: Secondary | ICD-10-CM | POA: Diagnosis not present

## 2023-05-02 DIAGNOSIS — R2681 Unsteadiness on feet: Secondary | ICD-10-CM | POA: Diagnosis not present

## 2023-05-02 DIAGNOSIS — Z66 Do not resuscitate: Secondary | ICD-10-CM | POA: Diagnosis not present

## 2023-05-02 DIAGNOSIS — I517 Cardiomegaly: Secondary | ICD-10-CM | POA: Diagnosis not present

## 2023-05-02 DIAGNOSIS — Z90710 Acquired absence of both cervix and uterus: Secondary | ICD-10-CM | POA: Diagnosis not present

## 2023-05-02 DIAGNOSIS — E875 Hyperkalemia: Secondary | ICD-10-CM | POA: Diagnosis not present

## 2023-05-02 DIAGNOSIS — E119 Type 2 diabetes mellitus without complications: Secondary | ICD-10-CM | POA: Diagnosis not present

## 2023-05-02 DIAGNOSIS — I5032 Chronic diastolic (congestive) heart failure: Secondary | ICD-10-CM | POA: Diagnosis not present

## 2023-05-02 DIAGNOSIS — Z87891 Personal history of nicotine dependence: Secondary | ICD-10-CM | POA: Diagnosis not present

## 2023-05-02 DIAGNOSIS — R404 Transient alteration of awareness: Secondary | ICD-10-CM | POA: Diagnosis not present

## 2023-05-02 DIAGNOSIS — Z888 Allergy status to other drugs, medicaments and biological substances status: Secondary | ICD-10-CM | POA: Diagnosis not present

## 2023-05-02 DIAGNOSIS — I5022 Chronic systolic (congestive) heart failure: Secondary | ICD-10-CM | POA: Diagnosis not present

## 2023-05-02 DIAGNOSIS — Z741 Need for assistance with personal care: Secondary | ICD-10-CM | POA: Diagnosis not present

## 2023-05-02 DIAGNOSIS — N2581 Secondary hyperparathyroidism of renal origin: Secondary | ICD-10-CM | POA: Diagnosis not present

## 2023-05-02 DIAGNOSIS — Z515 Encounter for palliative care: Secondary | ICD-10-CM | POA: Diagnosis not present

## 2023-05-02 DIAGNOSIS — Z7189 Other specified counseling: Secondary | ICD-10-CM | POA: Diagnosis not present

## 2023-05-02 DIAGNOSIS — R1312 Dysphagia, oropharyngeal phase: Secondary | ICD-10-CM | POA: Diagnosis not present

## 2023-05-02 DIAGNOSIS — Z992 Dependence on renal dialysis: Secondary | ICD-10-CM | POA: Diagnosis not present

## 2023-05-02 DIAGNOSIS — Z743 Need for continuous supervision: Secondary | ICD-10-CM | POA: Diagnosis not present

## 2023-05-02 DIAGNOSIS — I499 Cardiac arrhythmia, unspecified: Secondary | ICD-10-CM | POA: Diagnosis not present

## 2023-05-02 DIAGNOSIS — E569 Vitamin deficiency, unspecified: Secondary | ICD-10-CM | POA: Diagnosis not present

## 2023-05-02 DIAGNOSIS — N390 Urinary tract infection, site not specified: Secondary | ICD-10-CM | POA: Diagnosis not present

## 2023-05-02 DIAGNOSIS — Z885 Allergy status to narcotic agent status: Secondary | ICD-10-CM | POA: Diagnosis not present

## 2023-05-02 DIAGNOSIS — I468 Cardiac arrest due to other underlying condition: Secondary | ICD-10-CM | POA: Diagnosis not present

## 2023-05-02 DIAGNOSIS — J9620 Acute and chronic respiratory failure, unspecified whether with hypoxia or hypercapnia: Secondary | ICD-10-CM | POA: Diagnosis not present

## 2023-05-02 DIAGNOSIS — R7881 Bacteremia: Secondary | ICD-10-CM | POA: Diagnosis not present

## 2023-05-02 DIAGNOSIS — H409 Unspecified glaucoma: Secondary | ICD-10-CM | POA: Diagnosis not present

## 2023-05-02 DIAGNOSIS — Z4682 Encounter for fitting and adjustment of non-vascular catheter: Secondary | ICD-10-CM | POA: Diagnosis not present

## 2023-05-02 DIAGNOSIS — D631 Anemia in chronic kidney disease: Secondary | ICD-10-CM | POA: Diagnosis not present

## 2023-05-02 DIAGNOSIS — B955 Unspecified streptococcus as the cause of diseases classified elsewhere: Secondary | ICD-10-CM | POA: Diagnosis not present

## 2023-05-02 DIAGNOSIS — J9811 Atelectasis: Secondary | ICD-10-CM | POA: Diagnosis not present

## 2023-05-02 DIAGNOSIS — F419 Anxiety disorder, unspecified: Secondary | ICD-10-CM | POA: Diagnosis present

## 2023-05-02 LAB — GLUCOSE, CAPILLARY
Glucose-Capillary: 74 mg/dL (ref 70–99)
Glucose-Capillary: 96 mg/dL (ref 70–99)

## 2023-05-02 MED ORDER — PROSOURCE PLUS PO LIQD: 30.0000 mL | Freq: Two times a day (BID) | ORAL | Status: DC

## 2023-05-02 MED ORDER — NEPRO/CARBSTEADY PO LIQD: 237.0000 mL | Freq: Two times a day (BID) | ORAL | Status: DC

## 2023-05-02 MED ORDER — ALPRAZOLAM 0.5 MG PO TABS: 0.5000 mg | ORAL_TABLET | Freq: Three times a day (TID) | ORAL | 0 refills | Status: DC | PRN

## 2023-05-02 NOTE — Progress Notes (Signed)
 Daily Progress Note   Patient Name: Lori Stanley       Date: 05/02/2023 DOB: 1932/03/19  Age: 89 y.o. MRN#: [id number] Attending Physician: Caleen Qualia, MD Primary Care Physician: Sharma Coyer, MD Admit Date: 04/28/2023  Reason for Consultation/Follow-up: Establishing goals of care  Subjective: Notes reviewed.  Into see patient.  She is sitting in bedside chair, no family at bedside.  She denies complaint at this time.  She states she feels dialysis is going well for her.  PMT will sign off at this time.  Please reconsult if needs arise.   Length of Stay: 4  Current Medications: Scheduled Meds:   (feeding supplement) PROSource Plus  30 mL Oral BID BM   brimonidine   1 drop Both Eyes BID   carvedilol   6.25 mg Oral BID   feeding supplement (NEPRO CARB STEADY)  237 mL Oral BID BM   heparin   5,000 Units Subcutaneous Q8H   insulin  aspart  0-5 Units Subcutaneous QHS   insulin  aspart  0-6 Units Subcutaneous TID WC   multivitamin  1 tablet Oral Daily   pantoprazole   80 mg Oral Daily   sertraline   25 mg Oral Daily    Continuous Infusions:   ceFAZolin  (ANCEF ) IV Stopped (05/01/23 1551)    PRN Meds: acetaminophen  **OR** acetaminophen , albuterol , ALPRAZolam , dextrose , hydrALAZINE , ondansetron  **OR** ondansetron  (ZOFRAN ) IV, senna-docusate, traZODone   Physical Exam Pulmonary:     Effort: Pulmonary effort is normal.  Neurological:     Mental Status: She is alert.             Vital Signs: BP (!) 154/58 (BP Location: Right Arm)   Pulse 70   Temp (!) 97.5 F (36.4 C) (Oral)   Resp 18   Ht 5' 3 (1.6 m)   Wt 39.1 kg   SpO2 97%   BMI 15.27 kg/m  SpO2: SpO2: 97 % O2 Device: O2 Device: Nasal Cannula O2 Flow Rate: O2 Flow Rate (L/min): 2 L/min  Intake/output  summary:  Intake/Output Summary (Last 24 hours) at 05/02/2023 1229 Last data filed at 05/01/2023 1747 Gross per 24 hour  Intake 100 ml  Output 0 ml  Net 100 ml   LBM: Last BM Date : 05/01/23 Baseline Weight: Weight: 40 kg Most recent weight: Weight: 39.1 kg   Patient Active Problem List  Diagnosis Date Noted   Acute hypoxemic respiratory failure (HCC) 04/28/2023   Streptococcal bacteremia 04/12/2023   Pneumonia 04/07/2023   Dysphagia 04/07/2023   UTI (urinary tract infection) 04/07/2023   Enlarged salivary gland 04/06/2023   Severe protein-calorie malnutrition (HCC) 04/06/2023   Weight loss, non-intentional 04/06/2023   Encounter for annual wellness visit (AWV) in Medicare patient 02/02/2023   Memory change 01/28/2022   Primary osteoarthritis of both knees 01/27/2022   ABLA (acute blood loss anemia) 11/27/2021   GI bleed 11/26/2021   Chronic diastolic CHF (congestive heart failure) (HCC) 07/11/2020   Incarcerated left inguinal hernia 07/11/2020   SBO (small bowel obstruction) (HCC)    Anemia due to stage 4 chronic kidney disease (HCC) 08/27/2019   ESRD (end stage renal disease) (HCC)    Anemia in chronic kidney disease 01/23/2019   Secondary hyperparathyroidism of renal origin (HCC) 01/23/2019   Acute respiratory failure with hypoxia (HCC) 04/09/2017   Syncopal episodes 12/15/2015   Anemia of chronic disease 04/14/2015   Anxiety 04/14/2015   Clinical depression 04/14/2015   Essential hypertension 04/14/2015   Arthritis, degenerative 04/14/2015   Allergic rhinitis 04/14/2015   Hyperlipidemia 02/27/2015   Glaucoma 12/23/2014   Type 2 diabetes mellitus without complication, without long-term current use of insulin  (HCC) 12/23/2014   GERD (gastroesophageal reflux disease) 12/23/2014    Palliative Care Assessment & Plan    Recommendations/Plan: Patient is tolerating dialysis.  Continue full code/full scope. PMT will sign off at this time.  Please reconsult if needs  arise.  Code Status:    Code Status Orders  (From admission, onward)           Start     Ordered   04/28/23 1426  Full code  Continuous       Question:  By:  Answer:  Default: patient does not have capacity for decision making, no surrogate or prior directive available   04/28/23 1426           Code Status History     Date Active Date Inactive Code Status Order ID Comments User Context   04/07/2023 0821 04/18/2023 2122 Full Code [id number]  Eldonna Elspeth PARAS, MD ED   11/26/2021 2108 11/30/2021 2326 Full Code [id number]  Seena Marsa NOVAK, MD ED   07/17/2020 1203 07/20/2020 2343 Full Code [id number]  Terryl Arthea SAUNDERS, PA-C ED   07/11/2020 2304 07/14/2020 2235 Full Code [id number]  Ricky Alfrieda DASEN, DO ED   08/30/2019 1836 09/04/2019 0006 Full Code [id number]  Kandis Devaughn Sayres, MD Inpatient   06/05/2019 0340 06/17/2019 2309 Full Code [id number]  Mansy, Madison LABOR, MD ED   04/10/2017 0140 04/13/2017 2256 Full Code [id number]  Salary, Nemiah BIRCH, MD Inpatient      Advance Directive Documentation    Flowsheet Row Most Recent Value  Type of Advance Directive Living will  Pre-existing out of facility DNR order (yellow form or pink MOST form) --  MOST Form in Place? --       Thank you for allowing the Palliative Medicine Team to assist in the care of this patient.   Camelia Lewis, NP  Please contact Palliative Medicine Team phone at [phone] for questions and concerns.

## 2023-05-02 NOTE — Progress Notes (Signed)
 [facility]  ROUNDING NOTE   Subjective:   Lori Stanley was admitted to [facility] on 04/28/2023 for Acute respiratory failure with hypoxia (HCC) [J96.01] ESRD on hemodialysis (HCC) [N18.6, Z99.2] Syncope, unspecified syncope type [R55] Acute hypoxemic respiratory failure (HCC) [J96.01]   Update: Patient seen sitting up in chair Breakfast tray at bedside, relatively untouched Denies pain or discomfort  Awaiting discharge later today  Objective:  Vital signs in last 24 hours:  Temp:  [97.5 F (36.4 C)-98.2 F (36.8 C)] 97.5 F (36.4 C) (12/31 0749) Pulse Rate:  [66-92] 70 (12/31 0749) Resp:  [18-31] 18 (12/31 0749) BP: (121-178)/(58-76) 154/58 (12/31 0749) SpO2:  [91 %-100 %] 97 % (12/31 0749) Weight:  [39.1 kg] 39.1 kg (12/30 1606)  Weight change:  Filed Weights   04/29/23 1350 05/01/23 1213 05/01/23 1606  Weight: 37.7 kg 39.1 kg 39.1 kg    Intake/Output: I/O last 3 completed shifts: In: 100 [IV Piggyback:100] Out: 0    Intake/Output this shift:  No intake/output data recorded.  Physical Exam: General: NAD  Head: +dysphonic voice   Eyes: Anicteric  Lungs:  Clear to auscultation, normal effort  Heart: Regular rate and rhythm  Abdomen:  Soft, nontender  Extremities:  no peripheral edema.  Neurologic: Alert and oriented, moving all four extremities  Skin: No lesions  Access: Left AVF    Basic Metabolic Panel: Recent Labs  Lab 04/28/23 1243 04/29/23 0638 05/01/23 1237  NA 138 139 132*  K 3.3* 3.4* 3.5  CL 100 101 93*  CO2 25 27 26   GLUCOSE 85 64* 102*  BUN 26* 29* 20  CREATININE 3.90* 4.41* 3.95*  CALCIUM  8.4* 8.6* 8.6*  PHOS  --   --  3.5    Liver Function Tests: Recent Labs  Lab 04/28/23 1243 05/01/23 1237  AST 17  --   ALT <5  --   ALKPHOS 99  --   BILITOT 0.5  --   PROT 5.8*  --   ALBUMIN 2.6* 2.6*   No results for input(s): LIPASE, AMYLASE in the last 168 hours.  No results for input(s): AMMONIA in the  last 168 hours.  CBC: Recent Labs  Lab 04/28/23 1243 04/29/23 0638 05/01/23 1237  WBC 7.5 6.3 5.6  NEUTROABS 6.2  --   --   HGB 11.5* 11.3* 11.1*  HCT 38.6 37.5 36.1  MCV 93.9 91.2 91.2  PLT 275 322 307    Cardiac Enzymes: No results for input(s): CKTOTAL, CKMB, CKMBINDEX, TROPONINI in the last 168 hours.  BNP: Invalid input(s): POCBNP  CBG: Recent Labs  Lab 05/01/23 1128 05/01/23 1730 05/01/23 2211 05/02/23 0751 05/02/23 1132  GLUCAP 92 82 88 74 96    Microbiology: Results for orders placed or performed during the hospital encounter of 04/28/23  Resp panel by RT-PCR (RSV, Flu A&B, Covid) Anterior Nasal Swab     Status: None   Collection Time: 04/28/23 11:45 AM   Specimen: Anterior Nasal Swab  Result Value Ref Range Status   SARS Coronavirus 2 by RT PCR NEGATIVE NEGATIVE Final    Comment: (NOTE) SARS-CoV-2 target nucleic acids are NOT DETECTED.  The SARS-CoV-2 RNA is generally detectable in upper respiratory specimens during the acute phase of infection. The lowest concentration of SARS-CoV-2 viral copies this assay can detect is 138 copies/mL. A negative result does not preclude SARS-Cov-2 infection and should not be used as the sole basis for treatment or other patient management decisions. A negative result may occur with  improper specimen collection/handling, submission of specimen other than nasopharyngeal swab, presence of viral mutation(s) within the areas targeted by this assay, and inadequate number of viral copies(<138 copies/mL). A negative result must be combined with clinical observations, patient history, and epidemiological information. The expected result is Negative.  Fact Sheet for Patients:  bloggercourse.com  Fact Sheet for Healthcare Providers:  seriousbroker.it  This test is no t yet approved or cleared by the [place]  FDA and  has been authorized for detection and/or  diagnosis of SARS-CoV-2 by FDA under an Emergency Use Authorization (EUA). This EUA will remain  in effect (meaning this test can be used) for the duration of the COVID-19 declaration under Section 564(b)(1) of the Act, 21 U.S.C.section 360bbb-3(b)(1), unless the authorization is terminated  or revoked sooner.       Influenza A by PCR NEGATIVE NEGATIVE Final   Influenza B by PCR NEGATIVE NEGATIVE Final    Comment: (NOTE) The Xpert Xpress SARS-CoV-2/FLU/RSV plus assay is intended as an aid in the diagnosis of influenza from Nasopharyngeal swab specimens and should not be used as a sole basis for treatment. Nasal washings and aspirates are unacceptable for Xpert Xpress SARS-CoV-2/FLU/RSV testing.  Fact Sheet for Patients: bloggercourse.com  Fact Sheet for Healthcare Providers: seriousbroker.it  This test is not yet approved or cleared by the [place]  FDA and has been authorized for detection and/or diagnosis of SARS-CoV-2 by FDA under an Emergency Use Authorization (EUA). This EUA will remain in effect (meaning this test can be used) for the duration of the COVID-19 declaration under Section 564(b)(1) of the Act, 21 U.S.C. section 360bbb-3(b)(1), unless the authorization is terminated or revoked.     Resp Syncytial Virus by PCR NEGATIVE NEGATIVE Final    Comment: (NOTE) Fact Sheet for Patients: bloggercourse.com  Fact Sheet for Healthcare Providers: seriousbroker.it  This test is not yet approved or cleared by the [place]  FDA and has been authorized for detection and/or diagnosis of SARS-CoV-2 by FDA under an Emergency Use Authorization (EUA). This EUA will remain in effect (meaning this test can be used) for the duration of the COVID-19 declaration under Section 564(b)(1) of the Act, 21 U.S.C. section 360bbb-3(b)(1), unless the authorization is terminated  or revoked.  Performed at [facility], [address] [place], [place] [address]     Coagulation Studies: No results for input(s): LABPROT, INR in the last 72 hours.  Urinalysis: No results for input(s): COLORURINE, LABSPEC, PHURINE, GLUCOSEU, HGBUR, BILIRUBINUR, KETONESUR, PROTEINUR, UROBILINOGEN, NITRITE, LEUKOCYTESUR in the last 72 hours.  Invalid input(s): APPERANCEUR     Imaging: No results found.    Medications:     ceFAZolin  (ANCEF ) IV Stopped (05/01/23 1551)    (feeding supplement) PROSource Plus  30 mL Oral BID BM   brimonidine   1 drop Both Eyes BID   carvedilol   6.25 mg Oral BID   feeding supplement (NEPRO CARB STEADY)  237 mL Oral BID BM   heparin   5,000 Units Subcutaneous Q8H   insulin  aspart  0-5 Units Subcutaneous QHS   insulin  aspart  0-6 Units Subcutaneous TID WC   multivitamin  1 tablet Oral Daily   pantoprazole   80 mg Oral Daily   sertraline   25 mg Oral Daily   acetaminophen  **OR** acetaminophen , albuterol , ALPRAZolam , dextrose , hydrALAZINE , ondansetron  **OR** ondansetron  (ZOFRAN ) IV, senna-docusate, traZODone   Assessment/ Plan:  Lori Stanley is a 89 y.o.  female wth end stage renal disease on hemodialysis, hypertension, congestive heart failure, depression, GERD who presents  to [facility] with 04/28/2023 with Acute respiratory failure with hypoxia (HCC) [J96.01] ESRD on hemodialysis (HCC) [N18.6, Z99.2] Syncope, unspecified syncope type [R55] Acute hypoxemic respiratory failure (HCC) [J96.01]  [facility] Left AVF 43kg  End Stage Renal Disease on hemodialysis:   Received dialysis yesterday, tolerated well.  Next treatment scheduled for Wednesday.  Hypertension with chronic kidney disease: Continue carvedilol .  Blood pressure acceptable for this patient, 154/58.  Anemia with chronic kidney disease: mircera as outpatient. Hemoglobin 11.1.  No need for ESA's at this time.  Secondary  Hyperparathyroidism: currently holding phosphate binders. Gets parsabiv outpatient.  Calcium  and phosphorus within desired range.  5. Streptococcal bacteremia secondary to UTI.  Will continue cefazolin  2g/2g/2g with each outpatient dialysis per ID recommendations.    LOS: 4 Chapin Arduini 12/31/202412:24 PM

## 2023-05-02 NOTE — Progress Notes (Signed)
 Mobility Specialist - Progress Note   05/02/23 1400  Mobility  Activity Stood at bedside  Level of Assistance Moderate assist, patient does 50-74%  Assistive Device Front wheel walker  Range of Motion/Exercises All extremities;Active Assistive  Activity Response Tolerated well  Mobility visit 1 Mobility     Pt sitting in recliner upon arrival, utilizing 2L. Pt was being repositioned in chair for lunch when EMS entered. Pt assisted with donning UB/LB clothing and required minA to stand + foot blocking to prevent feet from sliding. Assist to don LB clothing d/t need for BUE support. Pt left in chair with EMS transport and NT preparing for pivot transfer.   Lennette Seip Mobility Specialist 05/02/23, 2:08 PM

## 2023-05-02 NOTE — Progress Notes (Signed)
Report given to [place], Lpn at [facility]. Awaiting transportation to facility via EMS.

## 2023-05-02 NOTE — Consult Note (Signed)
 [facility] Liaison Note  05/02/2023  Lori Stanley 12/12/1931 [id number]  Location: RN Hospital Liaison screened the patient remotely at [facility].  Insurance: [employer]   Lori Stanley is a 89 y.o. female who is a Primary Care Patient of Simmons-Robinson, [place], MD The patient was screened for 30 day readmission hospitalization with noted high risk score for unplanned readmission risk with 2 IP/1 ED in 6 months.  The patient was assessed for potential Care Management service needs for post hospital transition for care coordination. Review of patient's electronic medical record reveals patient was admitted for acute hypoxemic respiratory failure. Pt recommended for SNF level of care and will discharged today. Liaison will collaborate with PAC-RN on pt's discharged disposition for potential VBCI services.  Plan: [facility] Liaison will continue to follow progress and disposition to asess for post hospital community care coordination/management needs.  Referral request for community care coordination:  Liaison will collaborate with the PAC-RN on pt's discharged disposition.   VBCI Care Management/Population Health does not replace or interfere with any arrangements made by the Inpatient Transition of Care team.   For questions contact:   Olam Ku, RN, [facility] Liaison Daniel   [facility], Population Health Office Hours MTWF  8:00 am-6:00 pm Direct Dial: [phone] mobile [phone] [Office toll free line] Office Hours are M-F 8:30 - 5 pm [email or web address]

## 2023-05-02 NOTE — Discharge Summary (Signed)
 Physician Discharge Summary   Patient: Lori Stanley MRN: [id number] DOB: May 04, 1931  Admit date:     04/28/2023  Discharge date: 05/02/23  Discharge Physician: Amaryllis Dare   PCP: Sharma Coyer, MD   Recommendations at discharge:  Please obtain CBC and BMP and follow-up Continue with routine dialysis Please ensure completion of antibiotics which were prescribed during prior admission Follow-up with primary care provider within a week  Discharge Diagnoses: Principal Problem:   Acute hypoxemic respiratory failure (HCC) Active Problems:   Streptococcal bacteremia   ESRD (end stage renal disease) (HCC)   Essential hypertension   Type 2 diabetes mellitus without complication, without long-term current use of insulin  (HCC)   Anemia in chronic kidney disease   Anxiety   Severe protein-calorie malnutrition (HCC)   Syncopal episodes   GERD (gastroesophageal reflux disease)   Hyperlipidemia   Memory change   Hospital Course: [name] is a 89 year old female with history of end-stage renal disease on hemodialysis MWF, depression, anxiety, GERD, heart failure preserved ejection fraction, who presents emergency department for chief concerns of syncope, and O2 desaturation.  Initially requiring BiPAP, likely due to fluid overload with change in dialysis schedule due to holidays.  History of recent Streptococcus anginosus bacteremia for which she is on IV cefazolin  with dialysis until 05/05/2023.  Vitals in the ED showed temperature 99, respiration rate of 20, heart rate 69, blood pressure 124/61, SpO2 96% on 4 L nasal cannula.  Serum sodium is 138, potassium 3.3, chloride 100, bicarb 25, BUN 26, serum creatinine 3.90, EGFR of 11, nonfasting blood glucose 85, WBC 7.5, hemoglobin 11.5, platelets of 275.  BNP was elevated at 224.4.  High sensitive troponin was 58.  COVID/influenza A/influenza B/RSV PCR were negative.  AST, ALT, alk phos, T. bili were within normal  limits.  ED treatment: None.  EDP consulted nephrology who states the patient will get dialysis tomorrow, 04/29/2023.  12/28: Vital and labs stable, currently on 3 L of oxygen , with baseline of 2 L use.  Hypoglycemia overnight requiring intervention, poor p.o. intake.  Received dialysis today. Will need another PT and OT evaluation and insurance authorization before returning to her facility.  12/29; hemodynamically stable, back to baseline oxygen  requirement of 2 L.  PT is recommending SNF.  Patient is extremely frail and malnourished.  Currently full code.  Based on underlying comorbidities and being on dialysis, with history of multiple syncopal episode with dialysis makes her increased risk for mortality. Palliative care was also consulted to discuss goals of care.  12/31: Patient remained hemodynamically stable, had her dialysis yesterday. Patient should follow-up with outpatient palliative care to continue the discussion about goals of care.  Patient will complete her prior antibiotics on 05/04/2022.  She was given some dietary supplements due to her malnourishment and frailty.  Patient will continue with her routine dialysis and current medications and need to have a close follow-up with her providers and outpatient palliative care for further assistance.  Assessment and Plan: * Acute hypoxemic respiratory failure (HCC) Presumed secondary to fluid overload in setting of dialysis schedule change during holiday season Initially required BiPAP but later transition to [place], now weaned back to baseline of 2 L -Continue supplemental oxygen -  Streptococcal bacteremia History of Streptococcus anginosus bacteremia  Cefazolin  2 g IV daily on Monday Wednesday Friday (hemodialysis days) with projected end date on 05/05/2023 was resumed on admission with pharmacy consultation noting holiday change in her dialysis outpatient  ESRD (end stage renal disease) [facility]) Patient  received dialysis  yesterday. Will resume her normal schedule from Monday  Essential hypertension Home carvedilol  6.25 mg p.o. twice daily resumed on admission Hydralazine  5 mg IV every 6 hours as needed for SBP > 175, 5 days ordered  Type 2 diabetes mellitus without complication, without long-term current use of insulin  (HCC) Overnight some hypoglycemia requiring intervention Insulin  SSI with at bedtime coverage ordered, renal on HD dosing D50 25 mL, IV as needed for low blood sugar, 5 days ordered Goal inpatient blood glucose levels 140-180  Anemia in chronic kidney disease At baseline, patient range over the last 2 weeks has been 10.5-12.6  Anxiety Home alprazolam  0.5 mg p.o. 3 times daily as needed for anxiety resumed on admission  Severe protein-calorie malnutrition [facility]) Registered dietitian has been consulted  Syncopal episodes Patient apparently quite sensitive during dialysis with history of multiple short acting syncopal episodes with removal of extra fluid Fall precautions ordered       Consultants: Nephrology Procedures performed: Hemodialysis Disposition: Skilled nursing facility Diet recommendation:  Discharge Diet Orders (From admission, onward)     Start     Ordered   05/02/23 0000  Diet - low sodium heart healthy        05/02/23 1034           Renal diet DISCHARGE MEDICATION: Allergies as of 05/02/2023       Reactions   Antihistamines, Chlorpheniramine-type Other (See Comments)   Unknown reaction.   Other Other (See Comments)   Paxil  [paroxetine ]    makes her feel funny not in a good way   Codeine Rash, Other (See Comments)   Mouth sore        Medication List     STOP taking these medications    predniSONE  20 MG tablet Commonly known as: DELTASONE        TAKE these medications    (feeding supplement) PROSource Plus liquid Take 30 mLs by mouth 2 (two) times daily between meals.   feeding supplement (NEPRO CARB STEADY) Liqd Take 237 mLs by  mouth 2 (two) times daily between meals.   acetaminophen  500 MG tablet Commonly known as: TYLENOL  Take 500-1,000 mg by mouth every 6 (six) hours as needed for mild pain or fever.   albuterol  108 (90 Base) MCG/ACT inhaler Commonly known as: VENTOLIN  HFA Inhale 2 puffs into the lungs every 6 (six) hours as needed for wheezing or shortness of breath.   ALPRAZolam  0.5 MG tablet Commonly known as: XANAX  Take 1 tablet (0.5 mg total) by mouth 3 (three) times daily as needed for anxiety.   brimonidine  0.2 % ophthalmic solution Commonly known as: ALPHAGAN  Place 1 drop into both eyes 2 (two) times daily.   carvedilol  6.25 MG tablet Commonly known as: COREG  Take 1 tablet (6.25 mg total) by mouth 2 (two) times daily.   ceFAZolin  IVPB Commonly known as: ANCEF  Inject 2 g into the vein every Monday, Wednesday, and Friday with hemodialysis for 21 days. Indication:  S. Anginosus bacteremia Last Day of Therapy:  05/05/2023 Labs - Once weekly:  CBC/D and CMP Fax weekly lab results  promptly to [phone] To be administered at HD clinic   fluticasone  50 MCG/ACT nasal spray Commonly known as: FLONASE  SPRAY 1 SPRAY INTO BOTH NOSTRILS DAILY. What changed: See the new instructions.   loratadine  10 MG tablet Commonly known as: CLARITIN  Take 1 tablet (10 mg total) by mouth daily. What changed:  when to take this reasons to take this   multivitamin Tabs  tablet Take 1 tablet by mouth daily.   omeprazole  40 MG capsule Commonly known as: PRILOSEC Take 1 capsule (40 mg total) by mouth daily.   sertraline  25 MG tablet Commonly known as: ZOLOFT  TAKE 1 TABLET BY MOUTH EVERY DAY   torsemide  100 MG tablet Commonly known as: DEMADEX  TAKE 1 TABLET BY MOUTH EVERY DAY   traZODone  150 MG tablet Commonly known as: DESYREL  TAKE 1 TABLET BY MOUTH EVERYDAY AT BEDTIME What changed: See the new instructions.               Discharge Care Instructions  (From admission, onward)            Start     Ordered   05/02/23 0000  Discharge wound care:       Comments: 1.         Cleanse L arm full thickness wound with NS, apply silicone foam, lift foam daily to assess area, change foam q3 days and prn soiling. 2.         Clean buttocks/sacrum/coccyx with soap and water, dry and apply floor stock purple top skin protectant daily and prn soiling.  Cover with silicone foam. Lift foam daily to assess area, change foam q3 days and prn soiling. Re-consult WOC if area develops any necrotic (brown/yellow/tan/black) tissue.   05/02/23 1034   05/02/23 0000  Discharge wound care:       Comments: 1.         Cleanse L arm full thickness wound with NS, apply silicone foam, lift foam daily to assess area, change foam q3 days and prn soiling. 2.         Clean buttocks/sacrum/coccyx with soap and water, dry and apply floor stock purple top skin protectant daily and prn soiling.  Cover with silicone foam. Lift foam daily to assess area, change foam q3 days and prn soiling. Re-consult WOC if area develops any necrotic (brown/yellow/tan/black) tissue.   05/02/23 1039            Contact information for follow-up providers     Simmons-Robinson, Makiera, MD. Schedule an appointment as soon as possible for a visit in 1 week(s).   Specialty: Family Medicine Contact information: [address] [place] [address] [phone]              Contact information for after-discharge care     Destination     [facility] [place] [facility] Preferred SNF .   Service: Skilled Nursing Contact information: [address] [place]  [address] [phone]                    Discharge Exam: Fredricka Weights   04/29/23 1350 05/01/23 1213 05/01/23 1606  Weight: 37.7 kg 39.1 kg 39.1 kg   General.  Frail and malnourished elderly lady, in no acute distress. Pulmonary.  Lungs clear bilaterally, normal respiratory effort. CV.  Regular rate and rhythm, no JVD, rub  or murmur. Abdomen.  Soft, nontender, nondistended, BS positive. CNS.  Alert and oriented .  No focal neurologic deficit. Extremities.  No edema, no cyanosis, pulses intact and symmetrical.  Condition at discharge: stable  The results of significant diagnostics from this hospitalization (including imaging, microbiology, ancillary and laboratory) are listed below for reference.   Imaging Studies: CT Angio Chest PE W and/or Wo Contrast Result Date: 04/28/2023 CLINICAL DATA:  Pulmonary embolism (PE) suspected, high prob. Shortness of breath. EXAM: CT ANGIOGRAPHY CHEST WITH CONTRAST TECHNIQUE: Multidetector CT imaging  of the chest was performed using the standard protocol during bolus administration of intravenous contrast. Multiplanar CT image reconstructions and MIPs were obtained to evaluate the vascular anatomy. RADIATION DOSE REDUCTION: This exam was performed according to the departmental dose-optimization program which includes automated exposure control, adjustment of the mA and/or kV according to patient size and/or use of iterative reconstruction technique. CONTRAST:  68mL OMNIPAQUE  IOHEXOL  350 MG/ML SOLN COMPARISON:  CT angiography chest from 05/12/2013. FINDINGS: Cardiovascular: No evidence of embolism to the proximal subsegmental pulmonary artery level. Mild cardiomegaly. No pericardial effusion. No aortic aneurysm. There are coronary artery calcifications, in keeping with coronary artery disease. There are also moderate to severe peripheral atherosclerotic vascular calcifications of thoracic aorta and its major branches. Mediastinum/Nodes: Visualized thyroid  gland appears grossly unremarkable. No solid / cystic mediastinal masses. The esophagus is nondistended precluding optimal assessment. No axillary, mediastinal or hilar lymphadenopathy by size criteria. Lungs/Pleura: The central tracheo-bronchial tree is patent. There are patchy areas of linear, plate-like atelectasis and/or scarring  throughout bilateral lungs. Mild upper lobe predominant centrilobular emphysematous changes noted. No mass or consolidation. No pleural effusion or pneumothorax. No suspicious lung nodules. Upper Abdomen: When correlated with recent CT scan renal stone protocol from 04/06/2023, redemonstration of dystrophic calcification in the inferomedial liver partially seen but measuring at least 1.6 x 2.1 cm. There is a subcapsular 2.5 x 3.2 cm cyst in the medial aspect of the spleen, unchanged. Remaining visualized upper abdominal viscera within normal limits. Musculoskeletal: The visualized soft tissues of the chest wall are grossly unremarkable. No suspicious osseous lesions. There are moderate multilevel degenerative changes of the visualized cervicothoracic spine. There is fusion of C7 and T1 vertebral bodies with resultant kyphosis. There is also partial fusion of T8 and T9 vertebral bodies. There is endplate irregularity at T9/T10 vertebral bodies. No significant surrounding fat stranding. Findings are most likely degenerative. However, correlate clinically to determine the need for additional imaging with MRI to exclude discitis osteomyelitis. Review of the MIP images confirms the above findings. IMPRESSION: 1. No embolism to the proximal subsegmental pulmonary artery level. 2. No lung mass, consolidation, pleural effusion or pneumothorax. 3. Partial fusion of C7-T1 and T8-T9 vertebral bodies, new since the prior study. There is endplate irregularity at T8-T9 levels. No surrounding fat stranding. Findings are most likely degenerative. However, correlate clinically to determine the need for additional imaging with MRI to exclude discitis osteomyelitis. Aortic Atherosclerosis (ICD10-I70.0) and Emphysema (ICD10-J43.9). Electronically Signed   By: Ree Molt M.D.   On: 04/28/2023 15:14   DG Chest Portable 1 View Result Date: 04/28/2023 CLINICAL DATA:  Shortness of breath. EXAM: PORTABLE CHEST 1 VIEW COMPARISON:   Chest radiograph dated April 21, 2023. FINDINGS: Low lung volumes with similar chronic elevation of the right hemidiaphragm. Stable enlargement of the cardiac silhouette. Aortic atherosclerosis. Similar chronic appearing interstitial changes. No focal consolidation, sizeable pleural effusion, or pneumothorax. Advanced degenerative changes of the bilateral shoulders, more pronounced on the left. Diffusely decreased osseous mineralization. IMPRESSION: 1. Low lung volumes.  No acute cardiopulmonary findings. 2. Mild cardiomegaly. 3. Chronic elevation of the right hemidiaphragm. Electronically Signed   By: Harrietta Sherry M.D.   On: 04/28/2023 12:38   DG Chest 2 View Result Date: 04/21/2023 CLINICAL DATA:  Shortness of breath EXAM: CHEST - 2 VIEW COMPARISON:  X-ray 04/16/2023.  Older exams as well FINDINGS: Underinflation with elevated right hemidiaphragm. Enlarged cardiopericardial silhouette with a calcified aorta. Chronic interstitial changes again identified. No pneumothorax or edema. Artifact from the patient's  clothing. Advanced degenerative changes of the shoulders and spine. Osteopenia. Left upper extremity vascular stent is not as well seen on the current examination. IMPRESSION: Underinflation with elevated right hemidiaphragm. Enlarged heart with some vascular congestion. Electronically Signed   By: Ranell Bring M.D.   On: 04/21/2023 15:11   DG Chest 1 View Result Date: 04/16/2023 CLINICAL DATA:  [id number] Pneumonia [id number] EXAM: CHEST  1 VIEW COMPARISON:  04/06/2023 chest radiograph. FINDINGS: Stable cardiomediastinal silhouette with normal heart size. No pneumothorax. No pleural effusion. Low lung volumes. No overt pulmonary edema. Mild streaky bibasilar lung opacities, similar. Advanced bilateral glenohumeral osteoarthritis. Vascular stent overlies the medial proximal left upper extremity soft tissues. IMPRESSION: Low lung volumes with mild streaky bibasilar lung opacities, similar, favor  atelectasis. Electronically Signed   By: Selinda DELENA Blue M.D.   On: 04/16/2023 16:54   ECHOCARDIOGRAM COMPLETE Result Date: 04/13/2023    ECHOCARDIOGRAM REPORT   Patient Name:   Lori Stanley Date of Exam: 04/13/2023 Medical Rec #:  [id number]         Height:       62.0 in Accession #:    [id number]        Weight:       87.7 lb Date of Birth:  04/09/1932        BSA:          1.347 m Patient Age:    89 years          BP:           132/62 mmHg Patient Gender: F                 HR:           70 bpm. Exam Location:  [facility] Procedure: 2D Echo, Cardiac Doppler and Color Doppler Indications:     Bacteremia R78.81  History:         Patient has prior history of Echocardiogram examinations, most                  recent 01/06/2022. Previous Myocardial Infarction; Risk                  Factors:Hypertension and Dyslipidemia. CKD-stage 5.  Sonographer:     Christopher Furnace Referring Phys:  [id number] DONALD BERLIN Diagnosing Phys: Dwayne D Callwood MD IMPRESSIONS  1. Left ventricular ejection fraction, by estimation, is 60 to 65%. The left ventricle has normal function. The left ventricle has no regional wall motion abnormalities. There is severe asymmetric left ventricular hypertrophy of the septal segment. Left  ventricular diastolic parameters are consistent with Grade I diastolic dysfunction (impaired relaxation).  2. Right ventricular systolic function is normal. The right ventricular size is normal. Mildly increased right ventricular wall thickness.  3. The mitral valve is degenerative. Mild mitral valve regurgitation.  4. The tricuspid valve is degenerative.  5. The aortic valve is calcified. Aortic valve regurgitation is trivial. Aortic valve sclerosis is present, with no evidence of aortic valve stenosis. FINDINGS  Left Ventricle: Left ventricular ejection fraction, by estimation, is 60 to 65%. The left ventricle has normal function. The left ventricle has no regional wall motion abnormalities. The left ventricular  internal cavity size was normal in size. There is  severe asymmetric left ventricular hypertrophy of the septal segment. Left ventricular diastolic parameters are consistent with Grade I diastolic dysfunction (impaired relaxation). Right Ventricle: The right ventricular size is normal. Mildly increased right ventricular wall thickness. Right ventricular systolic function is normal.  Left Atrium: Left atrial size was normal in size. Right Atrium: Right atrial size was normal in size. Pericardium: There is no evidence of pericardial effusion. Mitral Valve: The mitral valve is degenerative in appearance. Mild mitral valve regurgitation. MV peak gradient, 10.6 mmHg. The mean mitral valve gradient is 4.0 mmHg. Tricuspid Valve: The tricuspid valve is degenerative in appearance. Tricuspid valve regurgitation is mild. Aortic Valve: The aortic valve is calcified. Aortic valve regurgitation is trivial. Aortic valve sclerosis is present, with no evidence of aortic valve stenosis. Aortic valve mean gradient measures 24.3 mmHg. Aortic valve peak gradient measures 38.5 mmHg. Aortic valve area, by VTI measures 1.13 cm. Pulmonic Valve: The pulmonic valve was normal in structure. Pulmonic valve regurgitation is not visualized. Aorta: The ascending aorta was not well visualized. IAS/Shunts: No atrial level shunt detected by color flow Doppler.  LEFT VENTRICLE PLAX 2D LVIDd:         3.40 cm   Diastology LVIDs:         2.30 cm   LV e' medial:    3.70 cm/s LV PW:         1.30 cm   LV E/e' medial:  28.1 LV IVS:        2.00 cm   LV e' lateral:   4.24 cm/s LVOT diam:     2.00 cm   LV E/e' lateral: 24.5 LV SV:         79 LV SV Index:   59 LVOT Area:     3.14 cm  RIGHT VENTRICLE RV Basal diam:  2.90 cm RV Mid diam:    2.60 cm RV S prime:     14.00 cm/s TAPSE (M-mode): 2.4 cm LEFT ATRIUM             Index        RIGHT ATRIUM           Index LA diam:        3.90 cm 2.90 cm/m   RA Area:     13.50 cm LA Vol (A2C):   79.7 ml 59.17 ml/m  RA  Volume:   37.50 ml  27.84 ml/m LA Vol (A4C):   99.9 ml 74.17 ml/m LA Biplane Vol: 97.0 ml 72.02 ml/m  AORTIC VALVE AV Area (Vmax):    1.07 cm AV Area (Vmean):   1.08 cm AV Area (VTI):     1.13 cm AV Vmax:           310.33 cm/s AV Vmean:          234.000 cm/s AV VTI:            0.703 m AV Peak Grad:      38.5 mmHg AV Mean Grad:      24.3 mmHg LVOT Vmax:         106.00 cm/s LVOT Vmean:        80.500 cm/s LVOT VTI:          0.253 m LVOT/AV VTI ratio: 0.36  AORTA Ao Root diam: 2.70 cm MITRAL VALVE                TRICUSPID VALVE MV Area (PHT): 2.50 cm     TR Peak grad:   14.3 mmHg MV Area VTI:   2.19 cm     TR Vmax:        189.00 cm/s MV Peak grad:  10.6 mmHg MV Mean grad:  4.0 mmHg     SHUNTS MV Vmax:  1.63 m/s     Systemic VTI:  0.25 m MV Vmean:      96.2 cm/s    Systemic Diam: 2.00 cm MV Decel Time: 303 msec MV E velocity: 104.00 cm/s MV A velocity: 143.00 cm/s MV E/A ratio:  0.73 Dwayne D Callwood MD Electronically signed by Cara JONETTA Lovelace MD Signature Date/Time: 04/13/2023/2:29:10 PM    Final    CT SOFT TISSUE NECK WO CONTRAST Result Date: 04/07/2023 CLINICAL DATA:  Enlarged salivary gland, infection suspected EXAM: CT NECK WITHOUT CONTRAST TECHNIQUE: Multidetector CT imaging of the neck was performed following the standard protocol without intravenous contrast. RADIATION DOSE REDUCTION: This exam was performed according to the departmental dose-optimization program which includes automated exposure control, adjustment of the mA and/or kV according to patient size and/or use of iterative reconstruction technique. COMPARISON:  None Available. FINDINGS: Evaluation is somewhat limited by the absence of intravenous contrast and patient positioning. Pharynx and larynx: Normal. No mass or swelling. Salivary glands: No acute finding in the parotid glands. Poor visualization of the submandibular glands but no definite mass or stone is seen. Thyroid : Normal. Lymph nodes: None enlarged or abnormal density.  Vascular: Vascular calcifications, including severe coronary and aortic atherosclerotic calcifications. Limited intracranial: Negative. Visualized orbits: No acute finding. Status post bilateral lens replacements. Mastoids and visualized paranasal sinuses: Clear. Skeleton: Advanced degenerative changes in the shoulders, with advanced joint space narrowing, subchondral sclerosis and subchondral cystic changes, with bony erosion in both glenohumeral joints. Fluid in the right glenohumeral joint, extending into the subacromial/subdeltoid bursa, likely with full-thickness superior rotator cuff tear. Advanced degenerative changes in the cervical spine, with vertebral body height loss at C7 and T1, which appears chronic. Pannus formation at C1-C2. Osseous fusion of T1 through T8, likely extending more inferiorly off field of view. Upper chest: Emphysema. No focal pulmonary opacity or pleural effusion. IMPRESSION: 1. Evaluation is limited by the absence of intravenous contrast and patient positioning. Within this limitation, no acute finding in the neck. Poor visualization of the submandibular glands but no definite mass or stone is seen. If there is persistent concern, consider ultrasound or contrasted neck CT. 2. Advanced degenerative changes in the shoulders, with bony erosion in both glenohumeral joints and a right glenohumeral joint effusion with subacromial/subdeltoid bursal fluid. CT cannot determine whether this fluid is inflammatory or infected. 3. Advanced degenerative changes in the cervical spine, with vertebral body height loss at C7 and T1, which appears chronic. 4. Severe coronary and aortic atherosclerotic calcifications. Aortic Atherosclerosis (ICD10-I70.0) and Emphysema (ICD10-J43.9). Electronically Signed   By: Donald Campion M.D.   On: 04/07/2023 14:12   US  Abdomen Limited RUQ (LIVER/GB) Result Date: 04/07/2023 CLINICAL DATA:  Cholelithiasis EXAM: ULTRASOUND ABDOMEN LIMITED RIGHT UPPER QUADRANT  COMPARISON:  Renal stone CT 04/06/2023. FINDINGS: Gallbladder: In the expected location of the gallbladder is a echogenic rounded shadowing area measuring 13 mm. This has is difficult to define as a true gallbladder structure. This has seen on previous exams as well. On remote studies the gallbladder was felt to be contracted with multiple stones. Appearance is similar going back to a CT of March 2022 Common bile duct: Diameter: 7 mm Liver: No focal lesion identified. Within normal limits in parenchymal echogenicity. Portal vein is patent on color Doppler imaging with normal direction of blood flow towards the liver. Other: None. IMPRESSION: No biliary ductal dilatation. There is a echogenic shadowing area in the hilum of the liver corresponding to the areas of calcification by CT. Previously this  was felt to be a contracted gallbladder with stones. That is possible but the structure is not as well defined by this ultrasound. Additional evaluation as clinically appropriate Electronically Signed   By: Ranell Bring M.D.   On: 04/07/2023 10:34   CT Renal Stone Study Result Date: 04/07/2023 CLINICAL DATA:  Right flank pain with hematuria, severe weight loss and difficulty eating. EXAM: CT ABDOMEN AND PELVIS WITHOUT CONTRAST TECHNIQUE: Multidetector CT imaging of the abdomen and pelvis was performed following the standard protocol without IV contrast. RADIATION DOSE REDUCTION: This exam was performed according to the departmental dose-optimization program which includes automated exposure control, adjustment of the mA and/or kV according to patient size and/or use of iterative reconstruction technique. COMPARISON:  July 17, 2020 FINDINGS: Lower chest: There is mild cardiomegaly with a predominantly stable moderate sized pericardial effusion. Hepatobiliary: No focal liver abnormality is seen. Multiple gallstones are seen within a contracted gallbladder, without evidence of gallbladder wall thickening, pericholecystic  inflammation or biliary dilatation. Pancreas: Unremarkable. No pancreatic ductal dilatation or surrounding inflammatory changes. Spleen: A stable 2.8 cm diameter partially calcified cyst is seen within the posterior aspect of the spleen. Adrenals/Urinary Tract: Adrenal glands are unremarkable. The kidneys are mildly atrophic in size, without renal calculi, focal lesion, or hydronephrosis. A mild amount of air is seen within the lumen of the urinary bladder. Stomach/Bowel: The stomach is poorly distended. Moderate to marked severity diffuse gastric wall thickening is noted. The appendix is surgically absent. No evidence of bowel dilatation. Numerous diverticula are seen scattered throughout the large bowel. Vascular/Lymphatic: Aortic atherosclerosis. No enlarged abdominal or pelvic lymph nodes. Reproductive: Status post hysterectomy. No adnexal masses. Other: No abdominal wall hernia or abnormality. No abdominopelvic ascites. Musculoskeletal: Diffusely sclerotic vertebral bodies are seen with marked severity multilevel degenerative changes noted throughout the thoracolumbar spine. IMPRESSION: 1. Cholelithiasis. 2. Colonic diverticulosis. 3. Moderate to marked severity diffuse gastric wall thickening which may represent gastritis. Correlation with follow-up upper GI series or endoscopy is recommended. 4. Mild amount of air within the lumen of the urinary bladder which may be secondary to recent instrumentation. 5. Mild cardiomegaly with a predominantly stable moderate sized pericardial effusion. 6. Aortic atherosclerosis. Aortic Atherosclerosis (ICD10-I70.0). Electronically Signed   By: Suzen Dials M.D.   On: 04/07/2023 00:10   DG Chest Portable 1 View Result Date: 04/06/2023 CLINICAL DATA:  Hypoxia EXAM: PORTABLE CHEST 1 VIEW COMPARISON:  03/09/2022 FINDINGS: The patient's head and chin obscure the apices. Chronic elevation of the right diaphragm. Cardiomegaly. Aortic atherosclerosis. Possible patchy right  mid lung infiltrates. No pneumothorax. Severe shoulder degenerative change IMPRESSION: Possible patchy right mid lung infiltrates. Chronic elevation of right diaphragm. Cardiomegaly. Electronically Signed   By: Luke Bun M.D.   On: 04/06/2023 23:40    Microbiology: Results for orders placed or performed during the hospital encounter of 04/28/23  Resp panel by RT-PCR (RSV, Flu A&B, Covid) Anterior Nasal Swab     Status: None   Collection Time: 04/28/23 11:45 AM   Specimen: Anterior Nasal Swab  Result Value Ref Range Status   SARS Coronavirus 2 by RT PCR NEGATIVE NEGATIVE Final    Comment: (NOTE) SARS-CoV-2 target nucleic acids are NOT DETECTED.  The SARS-CoV-2 RNA is generally detectable in upper respiratory specimens during the acute phase of infection. The lowest concentration of SARS-CoV-2 viral copies this assay can detect is 138 copies/mL. A negative result does not preclude SARS-Cov-2 infection and should not be used as the sole basis for treatment or other  patient management decisions. A negative result may occur with  improper specimen collection/handling, submission of specimen other than nasopharyngeal swab, presence of viral mutation(s) within the areas targeted by this assay, and inadequate number of viral copies(<138 copies/mL). A negative result must be combined with clinical observations, patient history, and epidemiological information. The expected result is Negative.  Fact Sheet for Patients:  bloggercourse.com  Fact Sheet for Healthcare Providers:  seriousbroker.it  This test is no t yet approved or cleared by the [place]  FDA and  has been authorized for detection and/or diagnosis of SARS-CoV-2 by FDA under an Emergency Use Authorization (EUA). This EUA will remain  in effect (meaning this test can be used) for the duration of the COVID-19 declaration under Section 564(b)(1) of the Act, 21 U.S.C.section  360bbb-3(b)(1), unless the authorization is terminated  or revoked sooner.       Influenza A by PCR NEGATIVE NEGATIVE Final   Influenza B by PCR NEGATIVE NEGATIVE Final    Comment: (NOTE) The Xpert Xpress SARS-CoV-2/FLU/RSV plus assay is intended as an aid in the diagnosis of influenza from Nasopharyngeal swab specimens and should not be used as a sole basis for treatment. Nasal washings and aspirates are unacceptable for Xpert Xpress SARS-CoV-2/FLU/RSV testing.  Fact Sheet for Patients: bloggercourse.com  Fact Sheet for Healthcare Providers: seriousbroker.it  This test is not yet approved or cleared by the [place]  FDA and has been authorized for detection and/or diagnosis of SARS-CoV-2 by FDA under an Emergency Use Authorization (EUA). This EUA will remain in effect (meaning this test can be used) for the duration of the COVID-19 declaration under Section 564(b)(1) of the Act, 21 U.S.C. section 360bbb-3(b)(1), unless the authorization is terminated or revoked.     Resp Syncytial Virus by PCR NEGATIVE NEGATIVE Final    Comment: (NOTE) Fact Sheet for Patients: bloggercourse.com  Fact Sheet for Healthcare Providers: seriousbroker.it  This test is not yet approved or cleared by the [place]  FDA and has been authorized for detection and/or diagnosis of SARS-CoV-2 by FDA under an Emergency Use Authorization (EUA). This EUA will remain in effect (meaning this test can be used) for the duration of the COVID-19 declaration under Section 564(b)(1) of the Act, 21 U.S.C. section 360bbb-3(b)(1), unless the authorization is terminated or revoked.  Performed at [facility], [address] [place], [place] [address]     Labs: CBC: Recent Labs  Lab 04/28/23 1243 04/29/23 0638 05/01/23 1237  WBC 7.5 6.3 5.6  NEUTROABS 6.2  --   --   HGB 11.5* 11.3* 11.1*   HCT 38.6 37.5 36.1  MCV 93.9 91.2 91.2  PLT 275 322 307   Basic Metabolic Panel: Recent Labs  Lab 04/28/23 1243 04/29/23 0638 05/01/23 1237  NA 138 139 132*  K 3.3* 3.4* 3.5  CL 100 101 93*  CO2 25 27 26   GLUCOSE 85 64* 102*  BUN 26* 29* 20  CREATININE 3.90* 4.41* 3.95*  CALCIUM  8.4* 8.6* 8.6*  PHOS  --   --  3.5   Liver Function Tests: Recent Labs  Lab 04/28/23 1243 05/01/23 1237  AST 17  --   ALT <5  --   ALKPHOS 99  --   BILITOT 0.5  --   PROT 5.8*  --   ALBUMIN 2.6* 2.6*   CBG: Recent Labs  Lab 05/01/23 0855 05/01/23 1128 05/01/23 1730 05/01/23 2211 05/02/23 0751  GLUCAP 67* 92 82 88 74    Discharge time spent: greater than 30  minutes.  This record has been created using Conservation officer, historic buildings. Errors have been sought and corrected,but may not always be located. Such creation errors do not reflect on the standard of care.   Signed: Amaryllis Dare, MD [facility] 05/02/2023

## 2023-05-02 NOTE — Plan of Care (Signed)

## 2023-05-02 NOTE — Progress Notes (Signed)
 Physical Therapy Treatment Patient Details Name: Lori Stanley MRN: [id number] DOB: 1932-03-04 Today's Date: 05/02/2023   History of Present Illness Pt is an 89 y/o F admitted on 04/28/23 after presenting with c/o syncope & O2 desaturation. Pt is being treated for acute hypoxemic respiratory failure, streptococcal bacteremia. PMH: ESRD on HD MWF, depression, anxiety, GERD, heart failure preserved EF    PT Comments  Pt seen for PT tx with pt agreeable; pt pleasant & very motivated to participate. Pt is able to complete STS from various surfaces with min<>mod assist depending on height of surface. Pt is able to ambulate increased distances in room & bathroom with RW & CGA but requires extra time to maneuver around obstacles with RW. Pt requires total assist for peri hygiene. Continue to recommend ongoing PT services to address balance, strengthening, endurance to increase independence with mobility & decrease fall risk.   If plan is discharge home, recommend the following: A little help with walking and/or transfers;A little help with bathing/dressing/bathroom;Assistance with cooking/housework;Assist for transportation;Help with stairs or ramp for entrance   Can travel by private vehicle     Yes  Equipment Recommendations  Other (comment) (defer to next venue)    Recommendations for Other Services       Precautions / Restrictions Precautions Precautions: Fall Precaution Comments: L UE fistula Restrictions Weight Bearing Restrictions Per Provider Order: No     Mobility  Bed Mobility               General bed mobility comments: not tested, pt received sitting on EOB, left in recliner    Transfers Overall transfer level: Needs assistance Equipment used: Rolling walker (2 wheels), 1 person hand held assist Transfers: Sit to/from Stand, Bed to chair/wheelchair/BSC Sit to Stand: Min assist, Mod assist   Step pivot transfers: Min assist (HHA)       General transfer  comment: STS from EOB with HHA min assist, step pivot bed>Recliner with HHA min assist. Pt requires mod assist for STS from low toilet with assistance to power up & shift weight anteriorly.    Ambulation/Gait Ambulation/Gait assistance: Contact guard assist Gait Distance (Feet): 25 Feet (+ 50 ft) Assistive device: Rolling walker (2 wheels) Gait Pattern/deviations: Decreased step length - left, Decreased step length - right, Decreased stride length Gait velocity: decreased     General Gait Details: extra time to maneuver RW around obstacles (usually after bumping into them)   Stairs             Wheelchair Mobility     Tilt Bed    Modified Rankin (Stroke Patients Only)       Balance Overall balance assessment: Needs assistance   Sitting balance-Leahy Scale: Good     Standing balance support: No upper extremity supported, During functional activity Standing balance-Leahy Scale: Fair Standing balance comment: CGA static standing to perform hand hygiene                            Cognition Arousal: Alert Behavior During Therapy: WFL for tasks assessed/performed Overall Cognitive Status: No family/caregiver present to determine baseline cognitive functioning Area of Impairment: Problem solving                               General Comments: Pleasant, follows commands throughout session        Exercises      General Comments General  comments (skin integrity, edema, etc.): Pt on 2L/min via nasal cannula. Pt with incontinet & continent BM, requires total assist for peri hygiene.      Pertinent Vitals/Pain Pain Assessment Pain Assessment: Faces Faces Pain Scale: Hurts a little bit Pain Location: L knee Pain Descriptors / Indicators: Discomfort, Aching Pain Intervention(s): Monitored during session, Limited activity within patient's tolerance    Home Living                          Prior Function            PT Goals  (current goals can now be found in the care plan section) Acute Rehab PT Goals Patient Stated Goal: none stated PT Goal Formulation: With patient Potential to Achieve Goals: Fair Progress towards PT goals: Progressing toward goals    Frequency    Min 1X/week      PT Plan      Co-evaluation              AM-PAC PT 6 Clicks Mobility   Outcome Measure  Help needed turning from your back to your side while in a flat bed without using bedrails?: A Little Help needed moving from lying on your back to sitting on the side of a flat bed without using bedrails?: A Little Help needed moving to and from a bed to a chair (including a wheelchair)?: A Little Help needed standing up from a chair using your arms (e.g., wheelchair or bedside chair)?: A Little Help needed to walk in hospital room?: A Little Help needed climbing 3-5 steps with a railing? : A Lot 6 Click Score: 17    End of Session Equipment Utilized During Treatment: Oxygen  Activity Tolerance: Patient tolerated treatment well Patient left: in chair;with call bell/phone within reach Nurse Communication: Mobility status PT Visit Diagnosis: Muscle weakness (generalized) (M62.81);Difficulty in walking, not elsewhere classified (R26.2);Unsteadiness on feet (R26.81)     Time: 9058-8994 PT Time Calculation (min) (ACUTE ONLY): 24 min  Charges:    $Therapeutic Activity: 23-37 mins PT General Charges $$ ACUTE PT VISIT: 1 Visit                     Lori Stanley, PT, DPT 05/02/23, 10:14 AM   Lori Stanley 05/02/2023, 10:12 AM

## 2023-05-02 NOTE — TOC Transition Note (Signed)
 Transition of Care [facility]) - Discharge Note   Patient Details  Name: Lori Stanley MRN: [id number] Date of Birth: April 04, 1932  Transition of Care [facility]) CM/SW Contact:  Lauraine JAYSON Carpen, LCSW Phone Number: 05/02/2023, 11:42 AM   Clinical Narrative:   Patient has orders to discharge to [facility] today. RN has already called report. EMS transport has been arranged and she is 3rd on the list. No further concerns. CSW signing off.  Final next level of care: Skilled Nursing Facility Barriers to Discharge: Barriers Resolved   Patient Goals and CMS Choice     Choice offered to / list presented to : Patient (Niece)      Discharge Placement   Existing PASRR number confirmed : 04/30/23            Patient to be transferred to facility by: EMS Name of family member notified: Abel Gull Patient and family notified of of transfer: 05/02/23  Discharge Plan and Services Additional resources added to the After Visit Summary for                                       Social Drivers of Health (SDOH) Interventions SDOH Screenings   Food Insecurity: No Food Insecurity (04/28/2023)  Housing: Low Risk  (04/28/2023)  Transportation Needs: No Transportation Needs (04/28/2023)  Utilities: Not At Risk (04/28/2023)  Alcohol  Screen: Low Risk  (02/02/2023)  Depression (PHQ2-9): Low Risk  (04/06/2023)  Financial Resource Strain: Low Risk  (11/01/2022)  Physical Activity: Inactive (11/01/2022)  Social Connections: Moderately Integrated (05/01/2023)  Stress: No Stress Concern Present (11/01/2022)  Tobacco Use: Medium Risk (04/28/2023)     Readmission Risk Interventions    04/09/2023    2:34 PM  Readmission Risk Prevention Plan  Transportation Screening Complete  PCP or Specialist Appt within 5-7 Days Complete  Home Care Screening Complete  Medication Review (RN CM) Complete

## 2023-05-04 ENCOUNTER — Telehealth: Payer: Self-pay | Admitting: Family Medicine

## 2023-05-04 ENCOUNTER — Ambulatory Visit: Payer: 59 | Admitting: Family Medicine

## 2023-05-04 ENCOUNTER — Encounter: Payer: Self-pay | Admitting: Family Medicine

## 2023-05-04 DIAGNOSIS — Z992 Dependence on renal dialysis: Secondary | ICD-10-CM | POA: Diagnosis not present

## 2023-05-04 DIAGNOSIS — I10 Essential (primary) hypertension: Secondary | ICD-10-CM | POA: Diagnosis not present

## 2023-05-04 DIAGNOSIS — N2581 Secondary hyperparathyroidism of renal origin: Secondary | ICD-10-CM | POA: Diagnosis not present

## 2023-05-04 DIAGNOSIS — R7881 Bacteremia: Secondary | ICD-10-CM | POA: Diagnosis not present

## 2023-05-04 DIAGNOSIS — N186 End stage renal disease: Secondary | ICD-10-CM | POA: Diagnosis not present

## 2023-05-04 DIAGNOSIS — J9620 Acute and chronic respiratory failure, unspecified whether with hypoxia or hypercapnia: Secondary | ICD-10-CM | POA: Diagnosis not present

## 2023-05-04 NOTE — Progress Notes (Deleted)
 Established patient visit   Patient: Lori Stanley   DOB: Aug 19, 1931   89 y.o. Female  MRN: [id number] Visit Date: 05/04/2023  Today's healthcare provider: Rockie Agent, MD   No chief complaint on file.  Subjective       Discussed the use of AI scribe software for clinical note transcription with the patient, who gave verbal consent to proceed.  History of Present Illness             Past Medical History:  Diagnosis Date   Acute heart failure (HCC)    Acute on chronic heart failure with preserved ejection fraction (HFpEF) (HCC) 06/05/2019   Anemia    Anemia due to stage 4 chronic kidney disease (HCC) 08/27/2019   Anxiety    CHF (congestive heart failure) (HCC)    Chronic kidney disease    stage V; end stage renal disease   Chronic kidney disease, stage 5 (HCC) 01/23/2019   Chronic low back pain    Depression    Diabetes mellitus without complication (HCC)    Essential hypertension, malignant 05/24/2013   GERD (gastroesophageal reflux disease)    Hematoma 07/17/2020   Hyperlipidemia    Hypertension    MI (myocardial infarction) (HCC)    Obesity    Proteinuria 01/23/2019   Respiratory failure (HCC) 04/09/2017   SOB (shortness of breath) 05/24/2013   Unilateral inguinal hernia with obstruction and without gangrene    Volume overload 08/30/2019    Medications: Outpatient Medications Prior to Visit  Medication Sig   acetaminophen  (TYLENOL ) 500 MG tablet Take 500-1,000 mg by mouth every 6 (six) hours as needed for mild pain or fever.    albuterol  (VENTOLIN  HFA) 108 (90 Base) MCG/ACT inhaler Inhale 2 puffs into the lungs every 6 (six) hours as needed for wheezing or shortness of breath.   ALPRAZolam  (XANAX ) 0.5 MG tablet Take 1 tablet (0.5 mg total) by mouth 3 (three) times daily as needed for anxiety.   brimonidine  (ALPHAGAN ) 0.2 % ophthalmic solution Place 1 drop into both eyes 2 (two) times daily.   carvedilol  (COREG ) 6.25 MG tablet Take 1 tablet  (6.25 mg total) by mouth 2 (two) times daily.   ceFAZolin  (ANCEF ) IVPB Inject 2 g into the vein every Monday, Wednesday, and Friday with hemodialysis for 21 days. Indication:  S. Anginosus bacteremia Last Day of Therapy:  05/05/2023 Labs - Once weekly:  CBC/D and CMP Fax weekly lab results  promptly to [phone] To be administered at HD clinic   fluticasone  (FLONASE ) 50 MCG/ACT nasal spray SPRAY 1 SPRAY INTO BOTH NOSTRILS DAILY. (Patient taking differently: Place 1 spray into both nostrils daily as needed for allergies.)   loratadine  (CLARITIN ) 10 MG tablet Take 1 tablet (10 mg total) by mouth daily. (Patient taking differently: Take 10 mg by mouth daily as needed for allergies.)   multivitamin (RENA-VIT) TABS tablet Take 1 tablet by mouth daily.    Nutritional Supplements (,FEEDING SUPPLEMENT, PROSOURCE PLUS) liquid Take 30 mLs by mouth 2 (two) times daily between meals.   Nutritional Supplements (FEEDING SUPPLEMENT, NEPRO CARB STEADY,) LIQD Take 237 mLs by mouth 2 (two) times daily between meals.   omeprazole  (PRILOSEC) 40 MG capsule Take 1 capsule (40 mg total) by mouth daily.   sertraline  (ZOLOFT ) 25 MG tablet TAKE 1 TABLET BY MOUTH EVERY DAY   torsemide  (DEMADEX ) 100 MG tablet TAKE 1 TABLET BY MOUTH EVERY DAY   traZODone  (DESYREL ) 150 MG tablet TAKE 1 TABLET BY  MOUTH EVERYDAY AT BEDTIME (Patient taking differently: Take 150 mg by mouth at bedtime as needed for sleep.)   No facility-administered medications prior to visit.    Review of Systems  {Insert previous labs (optional):23779} {See past labs  Heme  Chem  Endocrine  Serology  Results Review (optional):1}   Objective    There were no vitals taken for this visit. {Insert last BP/Wt (optional):23777}{See vitals history (optional):1}    Physical Exam  ***  No results found for any visits on 05/04/23.  Assessment & Plan     Problem List Items Addressed This Visit   None   Assessment and Plan               No follow-ups on file.         Rockie Agent, MD  [facility] [phone] (phone) [phone] (fax)  [facility]

## 2023-05-04 NOTE — Telephone Encounter (Signed)
 missed appt. no answer, no vm..letter was sent-[place]

## 2023-05-07 ENCOUNTER — Inpatient Hospital Stay
Admission: EM | Admit: 2023-05-07 | Discharge: 2023-06-03 | DRG: 951 | Disposition: E | Payer: 59 | Source: Skilled Nursing Facility | Attending: Internal Medicine | Admitting: Internal Medicine

## 2023-05-07 ENCOUNTER — Emergency Department: Payer: 59

## 2023-05-07 DIAGNOSIS — K219 Gastro-esophageal reflux disease without esophagitis: Secondary | ICD-10-CM | POA: Diagnosis not present

## 2023-05-07 DIAGNOSIS — Z90710 Acquired absence of both cervix and uterus: Secondary | ICD-10-CM

## 2023-05-07 DIAGNOSIS — I517 Cardiomegaly: Secondary | ICD-10-CM | POA: Diagnosis not present

## 2023-05-07 DIAGNOSIS — Z992 Dependence on renal dialysis: Secondary | ICD-10-CM | POA: Diagnosis not present

## 2023-05-07 DIAGNOSIS — R404 Transient alteration of awareness: Secondary | ICD-10-CM | POA: Diagnosis not present

## 2023-05-07 DIAGNOSIS — Z515 Encounter for palliative care: Principal | ICD-10-CM

## 2023-05-07 DIAGNOSIS — F419 Anxiety disorder, unspecified: Secondary | ICD-10-CM | POA: Diagnosis present

## 2023-05-07 DIAGNOSIS — R6889 Other general symptoms and signs: Secondary | ICD-10-CM | POA: Diagnosis not present

## 2023-05-07 DIAGNOSIS — E875 Hyperkalemia: Secondary | ICD-10-CM | POA: Diagnosis present

## 2023-05-07 DIAGNOSIS — Z885 Allergy status to narcotic agent status: Secondary | ICD-10-CM

## 2023-05-07 DIAGNOSIS — N186 End stage renal disease: Secondary | ICD-10-CM | POA: Diagnosis present

## 2023-05-07 DIAGNOSIS — Z555 Less than a high school diploma: Secondary | ICD-10-CM

## 2023-05-07 DIAGNOSIS — I5032 Chronic diastolic (congestive) heart failure: Secondary | ICD-10-CM | POA: Diagnosis not present

## 2023-05-07 DIAGNOSIS — F32A Depression, unspecified: Secondary | ICD-10-CM | POA: Diagnosis present

## 2023-05-07 DIAGNOSIS — Z4682 Encounter for fitting and adjustment of non-vascular catheter: Secondary | ICD-10-CM | POA: Diagnosis not present

## 2023-05-07 DIAGNOSIS — Z743 Need for continuous supervision: Secondary | ICD-10-CM | POA: Diagnosis not present

## 2023-05-07 DIAGNOSIS — Z87891 Personal history of nicotine dependence: Secondary | ICD-10-CM

## 2023-05-07 DIAGNOSIS — Z66 Do not resuscitate: Secondary | ICD-10-CM | POA: Diagnosis present

## 2023-05-07 DIAGNOSIS — I469 Cardiac arrest, cause unspecified: Secondary | ICD-10-CM | POA: Diagnosis present

## 2023-05-07 DIAGNOSIS — I468 Cardiac arrest due to other underlying condition: Secondary | ICD-10-CM | POA: Diagnosis not present

## 2023-05-07 DIAGNOSIS — I499 Cardiac arrhythmia, unspecified: Secondary | ICD-10-CM | POA: Diagnosis not present

## 2023-05-07 DIAGNOSIS — J9811 Atelectasis: Secondary | ICD-10-CM | POA: Diagnosis not present

## 2023-05-07 DIAGNOSIS — Z888 Allergy status to other drugs, medicaments and biological substances status: Secondary | ICD-10-CM

## 2023-05-07 LAB — CBC WITH DIFFERENTIAL/PLATELET
Abs Immature Granulocytes: 0.71 10*3/uL — ABNORMAL HIGH (ref 0.00–0.07)
Basophils Absolute: 0.1 10*3/uL (ref 0.0–0.1)
Basophils Relative: 0 %
Eosinophils Absolute: 0.1 10*3/uL (ref 0.0–0.5)
Eosinophils Relative: 0 %
HCT: 28.8 % — ABNORMAL LOW (ref 36.0–46.0)
Hemoglobin: 8.2 g/dL — ABNORMAL LOW (ref 12.0–15.0)
Immature Granulocytes: 5 %
Lymphocytes Relative: 36 %
Lymphs Abs: 4.9 10*3/uL — ABNORMAL HIGH (ref 0.7–4.0)
MCH: 28.2 pg (ref 26.0–34.0)
MCHC: 28.5 g/dL — ABNORMAL LOW (ref 30.0–36.0)
MCV: 99 fL (ref 80.0–100.0)
Monocytes Absolute: 0.4 10*3/uL (ref 0.1–1.0)
Monocytes Relative: 3 %
Neutro Abs: 7.5 10*3/uL (ref 1.7–7.7)
Neutrophils Relative %: 56 %
Platelets: 120 10*3/uL — ABNORMAL LOW (ref 150–400)
RBC: 2.91 MIL/uL — ABNORMAL LOW (ref 3.87–5.11)
RDW: 14.7 % (ref 11.5–15.5)
WBC: 13.6 10*3/uL — ABNORMAL HIGH (ref 4.0–10.5)
nRBC: 0.2 % (ref 0.0–0.2)

## 2023-05-07 LAB — COMPREHENSIVE METABOLIC PANEL
ALT: 10 U/L (ref 0–44)
AST: 136 U/L — ABNORMAL HIGH (ref 15–41)
Albumin: 1.8 g/dL — ABNORMAL LOW (ref 3.5–5.0)
Alkaline Phosphatase: 100 U/L (ref 38–126)
Anion gap: 12 (ref 5–15)
BUN: 15 mg/dL (ref 8–23)
CO2: 31 mmol/L (ref 22–32)
Calcium: 14.2 mg/dL (ref 8.9–10.3)
Chloride: 99 mmol/L (ref 98–111)
Creatinine, Ser: 4.05 mg/dL — ABNORMAL HIGH (ref 0.44–1.00)
GFR, Estimated: 10 mL/min — ABNORMAL LOW (ref >60)
Glucose, Bld: 154 mg/dL — ABNORMAL HIGH (ref 70–99)
Potassium: 4.8 mmol/L (ref 3.5–5.1)
Sodium: 142 mmol/L (ref 135–145)
Total Bilirubin: 0.5 mg/dL (ref 0.0–1.2)
Total Protein: 4.2 g/dL — ABNORMAL LOW (ref 6.5–8.1)

## 2023-05-07 LAB — CBG MONITORING, ED: Glucose-Capillary: 127 mg/dL — ABNORMAL HIGH (ref 70–99)

## 2023-05-07 LAB — TROPONIN I (HIGH SENSITIVITY): Troponin I (High Sensitivity): 17 ng/L (ref <18)

## 2023-05-07 LAB — MAGNESIUM: Magnesium: 2.5 mg/dL — ABNORMAL HIGH (ref 1.7–2.4)

## 2023-05-07 MED ORDER — NOREPINEPHRINE 4 MG/250ML-% IV SOLN
INTRAVENOUS | Status: AC
  Administered 2023-05-07: 2 ug/min via INTRAVENOUS
  Filled 2023-05-07: qty 250

## 2023-05-07 MED ORDER — BIOTENE DRY MOUTH MT LIQD: 15.0000 mL | OROMUCOSAL | Status: DC | PRN

## 2023-05-07 MED ORDER — POLYVINYL ALCOHOL 1.4 % OP SOLN: 1.0000 [drp] | Freq: Four times a day (QID) | OPHTHALMIC | Status: DC | PRN

## 2023-05-07 MED ORDER — CALCIUM CHLORIDE 10 % IV SOLN
INTRAVENOUS | Status: AC | PRN
  Administered 2023-05-07: 1 g via INTRAVENOUS

## 2023-05-07 MED ORDER — HALOPERIDOL LACTATE 2 MG/ML PO CONC: 0.5000 mg | ORAL | Status: DC | PRN

## 2023-05-07 MED ORDER — ONDANSETRON HCL 4 MG/2ML IJ SOLN: 4.0000 mg | Freq: Four times a day (QID) | INTRAMUSCULAR | Status: DC | PRN

## 2023-05-07 MED ORDER — GLYCOPYRROLATE 0.2 MG/ML IJ SOLN
0.2000 mg | INTRAMUSCULAR | Status: DC | PRN
  Filled 2023-05-07: qty 1

## 2023-05-07 MED ORDER — EPINEPHRINE 1 MG/10ML IJ SOSY
PREFILLED_SYRINGE | INTRAMUSCULAR | Status: AC | PRN
  Administered 2023-05-07: 1 mg via INTRAVENOUS

## 2023-05-07 MED ORDER — DEXTROSE 50 % IV SOLN
1.0000 | Freq: Once | INTRAVENOUS | Status: DC
Start: 2023-05-07 — End: 2023-05-07

## 2023-05-07 MED ORDER — EPINEPHRINE 1 MG/10ML IJ SOSY
PREFILLED_SYRINGE | INTRAMUSCULAR | Status: AC | PRN
  Administered 2023-05-07 (×2): 1 mg via INTRAVENOUS

## 2023-05-07 MED ORDER — HALOPERIDOL LACTATE 5 MG/ML IJ SOLN: 0.5000 mg | INTRAMUSCULAR | Status: DC | PRN

## 2023-05-07 MED ORDER — ACETAMINOPHEN 325 MG RE SUPP: 650.0000 mg | Freq: Four times a day (QID) | RECTAL | Status: DC | PRN

## 2023-05-07 MED ORDER — HALOPERIDOL 0.5 MG PO TABS: 0.5000 mg | ORAL_TABLET | ORAL | Status: DC | PRN

## 2023-05-07 MED ORDER — SODIUM CHLORIDE 0.9 % IV BOLUS
1000.0000 mL | Freq: Once | INTRAVENOUS | Status: AC
  Administered 2023-05-07: 1000 mL via INTRAVENOUS

## 2023-05-07 MED ORDER — ONDANSETRON 4 MG PO TBDP: 4.0000 mg | ORAL_TABLET | Freq: Four times a day (QID) | ORAL | Status: DC | PRN

## 2023-05-07 MED ORDER — MORPHINE SULFATE (PF) 2 MG/ML IV SOLN
2.0000 mg | INTRAVENOUS | Status: DC | PRN
  Administered 2023-05-07 (×2): 2 mg via INTRAVENOUS
  Filled 2023-05-07 (×2): qty 1

## 2023-05-07 MED ORDER — INSULIN ASPART 100 UNIT/ML IJ SOLN
10.0000 [IU] | Freq: Once | INTRAMUSCULAR | Status: DC
Start: 2023-05-07 — End: 2023-05-07

## 2023-05-07 MED ORDER — GLYCOPYRROLATE 0.2 MG/ML IJ SOLN: 0.2000 mg | INTRAMUSCULAR | Status: DC | PRN

## 2023-05-07 MED ORDER — LORAZEPAM 2 MG/ML IJ SOLN
1.0000 mg | INTRAMUSCULAR | Status: DC | PRN
  Administered 2023-05-07 (×2): 1 mg via INTRAVENOUS
  Filled 2023-05-07 (×2): qty 1

## 2023-05-07 MED ORDER — SODIUM BICARBONATE 8.4 % IV SOLN
INTRAVENOUS | Status: AC | PRN
  Administered 2023-05-07: 50 meq via INTRAVENOUS

## 2023-05-07 MED ORDER — ACETAMINOPHEN 325 MG PO TABS: 650.0000 mg | ORAL_TABLET | Freq: Four times a day (QID) | ORAL | Status: DC | PRN

## 2023-05-07 MED ORDER — NOREPINEPHRINE 4 MG/250ML-% IV SOLN: 0.0000 ug/min | INTRAVENOUS | Status: DC

## 2023-05-07 MED ORDER — GLYCOPYRROLATE 1 MG PO TABS: 1.0000 mg | ORAL_TABLET | ORAL | Status: DC | PRN

## 2023-05-10 ENCOUNTER — Telehealth: Payer: Self-pay

## 2023-05-10 NOTE — Telephone Encounter (Signed)
 Copied from CRM [id number]. Topic: General - Other >> May 10, 2023 10:23 AM Everette C wrote: The patient's sister Ms Dorlene has called to share that their visitation will be Thursday from 1-8 PM  The patient's sister shares that their funeral will be Friday 12 Noon at [facility]   Please contact further if needed at  [phone]

## 2023-05-11 NOTE — Telephone Encounter (Signed)
 Reviewed

## 2023-05-12 LAB — CULTURE, BLOOD (ROUTINE X 2)
Culture: NO GROWTH
Culture: NO GROWTH
Special Requests: ADEQUATE
Special Requests: ADEQUATE

## 2023-06-03 NOTE — ED Triage Notes (Signed)
 Pt arrives from [facility] for cardiac arrest. Pt was shocked once in the field and has sustained ROSC since then. Pt arrives with advanced airway and faint pulse.

## 2023-06-03 NOTE — ED Notes (Signed)
 Per MD, family wishes to transition to comfort care

## 2023-06-03 NOTE — ED Provider Notes (Addendum)
 Time of death 12:47 PM  Asystolic on monitor, no spontaneous respirations, no cardiac tones, observed to be in asystole with no spontaneous motions.  Family at bedside understanding, advised the patient's primary care is Dr. Lang.  [place], RN, reporting to [place] for review.   Dicky Anes, MD 05/11/2023 1253   Case discussed with on-call medical examiner, [place], who advises not an ME case    Dicky Anes, MD May 11, 2023 1352

## 2023-06-03 NOTE — ED Provider Notes (Signed)
 Clinical Course as of 05/08/23 2335  [place] May 09, 2023  1006 Accepted in admission by Dr. Debby.  Patient having spontaneous respirations but no obvious other neurologic alertness.  Family at bedside.  Currently extubated.  Will be admitted for ongoing palliative/comfort measures.  Patient is DNR/DNI [MQ]    Clinical Course User Index [MQ] Dicky Anes, MD      Dicky Anes, MD 05/08/23 [phone]

## 2023-06-03 NOTE — Progress Notes (Signed)
 Paged to visit with pt per RN. Pt moved to comfort care-family at bedside, introduced chaplain services and myself. Offered compassionate presence and listening ear. No major needs expressed at this time. Family aware then can reach us  at any point during this transition for [place].

## 2023-06-03 NOTE — Consult Note (Signed)
  Consultation Note Date: 05/16/2023   Patient Name: Lori Stanley  DOB: 1931/09/25  MRN: [id number]  Age / Sex: 89 y.o., female  PCP: Sharma Coyer, MD Referring Physician: Debby Camila LABOR, MD  Reason for Consultation: Establishing goals of care   HPI/Brief Hospital Course: 89 y.o. female  with past medical history of ESRD on HD MWF, HFpEF, depression, anxiety and GERD admitted from [facility] on May 16, 2023 s/p cardiac arrest. Reportedly Ms. Schuler was found unresponsive at her facility without a pulse, CPR was initiated and ROSC was obtained, unknown downtime, possibly 1 hour before last known well time before CPR initiated.  Family conversations had with ED physician, decision made to transition to full comfort measures.  Ms. Spink well known to PMT service as she was recently followed by our service during previous admission  Palliative medicine was consulted for assisting with transition to comfort care.  Subjective:  Extensive chart review has been completed prior to meeting patient including labs, vital signs, imaging, progress notes, orders, and available advanced directive documents from current and previous encounters.  Visited with Ms. Eaves at her bedside. She is resting in bed with eyes closed, unresponsive. Breathing shallow. Niece-Mahalia and Ms. Bamber's sister at bedside.  Introduced myself as a publishing rights manager as a member of the palliative care team. Explained palliative medicine is specialized medical care for people living with serious illness. It focuses on providing relief from the symptoms and stress of a serious illness. The goal is to improve quality of life for both the patient and the family.   Family at bedside confirms desire for CMO at this time. We discussed the possibility of [facility] but with hesitation at this time due to high risk of Ms. Sussman not surviving  transport. Family expressed interest but also concerned about transport, will reassess in the next few hours for stability. Likely to anticipate hospital death.  Orders placed to reflect full comfort measures. Answered and addressed all questions and concerns.  All questions/concerns addressed. Emotional support provided to patient/family/support persons. PMT will continue to follow and support patient as needed.  Objective: Primary Diagnoses: Present on Admission:  Cardiac arrest (HCC)    Assessment and Plan  SUMMARY OF RECOMMENDATIONS   DNR/DNI/Comfort  Palliative Prophylaxis:   Bowel Regimen, Delirium Protocol and Frequent Pain Assessment  Spiritual:  Desire for ongoing Chaplain support: No Education provided on Chaplain services offered through PMT, education provided on Grief/Bereavement support services    Discussed With: Nursing staff   Thank you for this consult and allowing Palliative Medicine to participate in the care of Anakaren W. Hernon. Palliative medicine will continue to follow and assist as needed.   Time Total: 55 minutes  Time spent includes: Detailed review of medical records (labs, imaging, vital signs), medically appropriate exam (mental status, respiratory, cardiac, skin), discussed with treatment team, counseling and educating patient, family and staff, documenting clinical information, medication management and coordination of care.   Signed by: Waddell Lesches, DNP, AGNP-C Palliative Medicine    Please contact Palliative Medicine Team phone at [phone] for questions and concerns.  For individual provider: See Tracey

## 2023-06-03 NOTE — ED Provider Notes (Addendum)
 [facility] Department of Emergency Medicine     Chief Complaint: Cardiac arrest/unresponsive   Level V Caveat: Unresponsive  History of present illness:  EMS reports patient was found asystolic and unresponsive, she was able to be resuscitated but had unknown downtime perhaps greater than an hour since last witnessed.  Treated for hyperkalemia by EMS.  ROS: Unable to obtain, Level V caveat  Scheduled Meds: Continuous Infusions: PRN Meds:.LORazepam , morphine  injection Past Medical History:  Diagnosis Date   Acute heart failure (HCC)    Acute on chronic heart failure with preserved ejection fraction (HFpEF) (HCC) 06/05/2019   Anemia    Anemia due to stage 4 chronic kidney disease (HCC) 08/27/2019   Anxiety    CHF (congestive heart failure) (HCC)    Chronic kidney disease    stage V; end stage renal disease   Chronic kidney disease, stage 5 (HCC) 01/23/2019   Chronic low back pain    Depression    Diabetes mellitus without complication (HCC)    Essential hypertension, malignant 05/24/2013   GERD (gastroesophageal reflux disease)    Hematoma 07/17/2020   Hyperlipidemia    Hypertension    MI (myocardial infarction) (HCC)    Obesity    Proteinuria 01/23/2019   Respiratory failure (HCC) 04/09/2017   SOB (shortness of breath) 05/24/2013   Unilateral inguinal hernia with obstruction and without gangrene    Volume overload 08/30/2019   Past Surgical History:  Procedure Laterality Date   A/V FISTULAGRAM Left 01/15/2020   Procedure: A/V FISTULAGRAM;  Surgeon: Jama Cordella MATSU, MD;  Location: [facility] INVASIVE CV LAB;  Service: Cardiovascular;  Laterality: Left;   ABDOMINAL HYSTERECTOMY     APPENDECTOMY     AV FISTULA PLACEMENT Left 11/13/2019   Procedure: INSERTION OF ARTERIOVENOUS (AV) GORE-TEX GRAFT ARM ( BRACHIAL AXILLARY );  Surgeon: Jama Cordella MATSU, MD;  Location: [facility] ORS;  Service: Vascular;  Laterality: Left;   DIALYSIS/PERMA CATHETER INSERTION N/A  06/07/2019   Procedure: DIALYSIS/PERMA CATHETER INSERTION;  Surgeon: Jama Cordella MATSU, MD;  Location: [facility] INV[facility]IVE CV [facility];  Service: Cardiovascular;  Laterality: N/A;   DIALYSIS/PERMA CATHETER REMOVAL N/A 02/11/2020   Procedure: DIALYSIS/PERMA CATHETER REMOVAL;  Surgeon: Jama Cordella MATSU, MD;  Location: [facility] IN[facility];  Service: Cardiovascular;  Laterality: N/A;   ESOPHAGOGASTRODUODENOSCOPY (EGD) WITH PROPOFOL  N/A 11/30/2021   Procedure: ESOPHAGOGASTRODUODENOSCOPY (EGD) WITH PROPOFOL ;  Surgeon: Unk Corinn Skiff, MD;  Location: [facility] ENDOSCOPY;  Service: Gastroenterology;  Laterality: N/A;   EYE SURGERY     HERNIA REPAIR     INCISION AND DRAINAGE PERIRECTAL ABSCESS     INGUINAL HERNIA REPAIR N/A 07/12/2020   Procedure: HERNIA REPAIR INGUINAL ADULT;  Surgeon: Desiderio Schanz, MD;  Location: [facility] ORS;  Service: General;  Laterality: N/A;   KNEE SURGERY     THYROID  SURGERY     VAGINAL HYSTERECTOMY     Social History   Socioeconomic History   Marital status: Widowed    Spouse name: Not on file   Number of children: 1   Years of education: Not on file   Highest education level: 11th grade  Occupational History   Occupation: retired  Tobacco Use   Smoking status: Former    Types: Cigarettes   Smokeless tobacco: Never   Tobacco comments:    Quit in 2015-2016  Vaping Use   Vaping status: Never Used  Substance and Sexual Activity   Alcohol  use: No   Drug use: No   Sexual activity: Never  Other Topics  Concern   Not on file  Social History Narrative   Lives in [place]; self; friends do chores; quit smoking many years; no alcohol . Worked in [employer].    Social Drivers of Corporate Investment Banker Strain: Low Risk  (11/01/2022)   Overall Financial Resource Strain (CARDIA)    Difficulty of Paying Living Expenses: Not hard at all  Food Insecurity: No Food Insecurity (04/28/2023)   Hunger Vital Sign    Worried About Running Out of Food in the Last Year: Never true    Ran  Out of Food in the Last Year: Never true  Transportation Needs: No Transportation Needs (04/28/2023)   PRAPARE - Administrator, Civil Service (Medical): No    Lack of Transportation (Non-Medical): No  Physical Activity: Inactive (11/01/2022)   Exercise Vital Sign    Days of Exercise per Week: 0 days    Minutes of Exercise per Session: 0 min  Stress: No Stress Concern Present (11/01/2022)   [employer] of Occupational Health - Occupational Stress Questionnaire    Feeling of Stress : Not at all  Social Connections: Moderately Integrated (05/01/2023)   Social Connection and Isolation Panel [NHANES]    Frequency of Communication with Friends and Family: More than three times a week    Frequency of Social Gatherings with Friends and Family: More than three times a week    Attends Religious Services: More than 4 times per year    Active Member of [employer] or Organizations: Yes    Attends Banker Meetings: 1 to 4 times per year    Marital Status: Widowed  Intimate Partner Violence: Not At Risk (04/28/2023)   Humiliation, Afraid, Rape, and Kick questionnaire    Fear of Current or Ex-Partner: No    Emotionally Abused: No    Physically Abused: No    Sexually Abused: No   Allergies  Allergen Reactions   Antihistamines, Chlorpheniramine-Type Other (See Comments)    Unknown reaction.   Other Other (See Comments)   Paxil  [Paroxetine ]     makes her feel funny not in a good way   Codeine Rash and Other (See Comments)    Mouth sore    Last set of Vital Signs (not current) Vitals:   13-May-2023 0851 13-May-2023 0859  BP: (!) 130/43 (!) 89/58  Pulse:    Resp:    SpO2:        Physical Exam  Gen: unresponsive, end-tidal to approximately 50 square waveform with Igel in place Cardiovascular: Weak femoral pulse Resp: apneic. Breath sounds equal bilaterally with bagging  Abd: nondistended  Neuro: GCS 3, unresponsive to pain  HEENT: Small blood in posterior pharynx,  gag reflex absent  Neck: No crepitus  Musculoskeletal: No deformity  Skin: warm centrally cool peripherally Dialysis fistula left upper arm Procedures (when applicable, including Critical Care time): Procedure Name: Intubation Date/Time: 05/13/2023 9:11 AM  Performed by: Dicky Anes, MDPre-anesthesia Checklist: Patient identified, Emergency Drugs available, Suction available and Patient being monitored Oxygen  Delivery Method: Ambu bag Preoxygenation: Pre-oxygenation with 100% oxygen  Induction Type: IV induction and Rapid sequence Laryngoscope Size: Glidescope and 3 Grade View: Grade I Tube size: 7.5 mm Number of attempts: 1 Airway Equipment and Method: Video-laryngoscopy Placement Confirmation: ETT inserted through vocal cords under direct vision, CO2 detector and Breath sounds checked- equal and bilateral Secured at: 22 cm Tube secured with: ETT holder Dental Injury: Teeth and Oropharynx as per pre-operative assessment  Comments: Exchanged EyeGel out for endotracheal tube.  No complications.  Edentulous.  No hypoxia.     CRITICAL CARE Performed by: Oneil Budge   Total critical care time: 45 minutes  Critical care time was exclusive of separately billable procedures and treating other patients.  Critical care was necessary to treat or prevent imminent or life-threatening deterioration.  Critical care was time spent personally by me on the following activities: development of treatment plan with patient and/or surrogate as well as nursing, discussions with consultants, evaluation of patient's response to treatment, examination of patient, obtaining history from patient or surrogate, ordering and performing treatments and interventions, ordering and review of laboratory studies, ordering and review of radiographic studies, pulse oximetry and re-evaluation of patient's condition.   MDM / Assessment and Plan  After a few cycles of CPR and treatment for possible hyperkalemia based  on clinical history as well as an ECG obtained patient did have return of circulation with sustained ROSC.  Family gathered at the bedside quickly, and her sister who would the family acknowledges is her closest family, and patient does not have a directed healthcare power of attorney per family, has requested that we withdraw further care.  She does not want her sister to have a breathing tube and or undergo CPR.  They would like to transition to comfort.  The patient's sister as well as her granddaughter who are at the bedside would like to proceed with comfort measures only  I suspect the patient's arrest may be due to multiple different causes but in this case hyperkalemia being high on the differential.  Labs are pending.  I have discontinued further resuscitative measures.  Family very understanding and agreeable with the plan to move to comfort measures only based on what they perceived would be the patient's wishes.  Consulted with, and patient accepted to hospital service by Dr. Debby.   Impression Asystolic cardiac arrest with return of spontaneous circulation Widened QRS complex on ECG, suggestive of hyperkalemia or other acute metabolic anomoly Ida Budge Oneil, MD 22-May-2023 1158    Budge Oneil, MD May 22, 2023 1220

## 2023-06-03 NOTE — Progress Notes (Signed)
 Visited after being notified of pt death-offered condolences and compassionate presence. Assisted in completing the funeral home papers and worked with RN to get accurate information.

## 2023-06-03 NOTE — ED Notes (Addendum)
 Family notified this RN that they think the patient passed. This RN and Quale, MD went to the bedside at this time. This RN did not see any spontaneous respirations nor did the patient have a pulse. Both this RN and Quale, MD listened for a heartbeat for 1 full minute and did not hear anything. Time of death was called by Dicky, MD at 1247. Debby, MD made aware at this time as well as Rosina, Consulting Civil Engineer made aware.

## 2023-06-03 DEATH — deceased
